# Patient Record
Sex: Female | Born: 1945
Health system: Southern US, Community
[De-identification: ages and names within clinical notes are randomized; demographics above are authoritative.]

## PROBLEM LIST (undated history)

## (undated) DIAGNOSIS — M797 Fibromyalgia: Secondary | ICD-10-CM

## (undated) DIAGNOSIS — N200 Calculus of kidney: Secondary | ICD-10-CM

## (undated) DIAGNOSIS — I10 Essential (primary) hypertension: Secondary | ICD-10-CM

## (undated) DIAGNOSIS — M1990 Unspecified osteoarthritis, unspecified site: Secondary | ICD-10-CM

## (undated) DIAGNOSIS — G20 Parkinson's disease: Secondary | ICD-10-CM

## (undated) DIAGNOSIS — J329 Chronic sinusitis, unspecified: Secondary | ICD-10-CM

## (undated) DIAGNOSIS — T4145XA Adverse effect of unspecified anesthetic, initial encounter: Secondary | ICD-10-CM

## (undated) DIAGNOSIS — F5104 Psychophysiologic insomnia: Secondary | ICD-10-CM

## (undated) DIAGNOSIS — T8859XA Other complications of anesthesia, initial encounter: Secondary | ICD-10-CM

## (undated) DIAGNOSIS — E785 Hyperlipidemia, unspecified: Secondary | ICD-10-CM

## (undated) DIAGNOSIS — E7800 Pure hypercholesterolemia, unspecified: Secondary | ICD-10-CM

## (undated) DIAGNOSIS — E079 Disorder of thyroid, unspecified: Secondary | ICD-10-CM

## (undated) DIAGNOSIS — E039 Hypothyroidism, unspecified: Secondary | ICD-10-CM

## (undated) DIAGNOSIS — I739 Peripheral vascular disease, unspecified: Secondary | ICD-10-CM

## (undated) DIAGNOSIS — R7303 Prediabetes: Secondary | ICD-10-CM

## (undated) HISTORY — DX: Disorder of thyroid, unspecified: E07.9

## (undated) HISTORY — PX: ABDOMINAL HYSTERECTOMY: SHX81

## (undated) HISTORY — DX: Essential (primary) hypertension: I10

## (undated) HISTORY — DX: Hyperlipidemia, unspecified: E78.5

## (undated) HISTORY — DX: Parkinson's disease: G20

## (undated) HISTORY — PX: EXPLORATORY LAPAROTOMY: SUR591

## (undated) HISTORY — DX: Pure hypercholesterolemia, unspecified: E78.00

## (undated) HISTORY — DX: Prediabetes: R73.03

## (undated) HISTORY — PX: APPENDECTOMY: SHX54

## (undated) HISTORY — PX: JOINT REPLACEMENT: SHX530

## (undated) HISTORY — PX: CHOLECYSTECTOMY: SHX55

## (undated) HISTORY — DX: Psychophysiologic insomnia: F51.04

## (undated) HISTORY — PX: CATARACT EXTRACTION: SUR2

## (undated) HISTORY — PX: COLONOSCOPY: SHX174

## (undated) HISTORY — DX: Fibromyalgia: M79.7

---

## 1998-03-15 ENCOUNTER — Encounter: Payer: Self-pay | Admitting: Internal Medicine

## 1998-03-15 ENCOUNTER — Inpatient Hospital Stay (HOSPITAL_COMMUNITY): Admission: EM | Admit: 1998-03-15 | Discharge: 1998-03-16 | Payer: Self-pay | Admitting: Emergency Medicine

## 1998-03-16 ENCOUNTER — Encounter: Payer: Self-pay | Admitting: Emergency Medicine

## 1999-03-16 ENCOUNTER — Other Ambulatory Visit: Admission: RE | Admit: 1999-03-16 | Discharge: 1999-03-16 | Payer: Self-pay | Admitting: *Deleted

## 2000-06-13 ENCOUNTER — Inpatient Hospital Stay (HOSPITAL_COMMUNITY): Admission: EM | Admit: 2000-06-13 | Discharge: 2000-06-16 | Payer: Self-pay | Admitting: Emergency Medicine

## 2000-06-13 ENCOUNTER — Encounter: Payer: Self-pay | Admitting: Emergency Medicine

## 2000-06-13 ENCOUNTER — Encounter: Payer: Self-pay | Admitting: General Surgery

## 2000-06-14 ENCOUNTER — Encounter: Payer: Self-pay | Admitting: General Surgery

## 2000-07-03 ENCOUNTER — Other Ambulatory Visit: Admission: RE | Admit: 2000-07-03 | Discharge: 2000-07-03 | Payer: Self-pay | Admitting: *Deleted

## 2000-07-16 ENCOUNTER — Encounter: Payer: Self-pay | Admitting: Gastroenterology

## 2000-07-16 ENCOUNTER — Encounter: Admission: RE | Admit: 2000-07-16 | Discharge: 2000-07-16 | Payer: Self-pay | Admitting: Gastroenterology

## 2001-07-04 ENCOUNTER — Other Ambulatory Visit: Admission: RE | Admit: 2001-07-04 | Discharge: 2001-07-04 | Payer: Self-pay | Admitting: *Deleted

## 2002-09-18 ENCOUNTER — Other Ambulatory Visit: Admission: RE | Admit: 2002-09-18 | Discharge: 2002-09-18 | Payer: Self-pay | Admitting: *Deleted

## 2004-11-02 ENCOUNTER — Other Ambulatory Visit: Admission: RE | Admit: 2004-11-02 | Discharge: 2004-11-02 | Payer: Self-pay | Admitting: Obstetrics and Gynecology

## 2007-01-17 ENCOUNTER — Ambulatory Visit: Payer: Self-pay

## 2008-03-03 ENCOUNTER — Inpatient Hospital Stay (HOSPITAL_COMMUNITY): Admission: AD | Admit: 2008-03-03 | Discharge: 2008-03-04 | Payer: Self-pay | Admitting: Family Medicine

## 2008-03-03 ENCOUNTER — Encounter (INDEPENDENT_AMBULATORY_CARE_PROVIDER_SITE_OTHER): Payer: Self-pay | Admitting: Internal Medicine

## 2008-03-04 ENCOUNTER — Encounter (INDEPENDENT_AMBULATORY_CARE_PROVIDER_SITE_OTHER): Payer: Self-pay | Admitting: Internal Medicine

## 2008-06-14 ENCOUNTER — Emergency Department (HOSPITAL_BASED_OUTPATIENT_CLINIC_OR_DEPARTMENT_OTHER): Admission: EM | Admit: 2008-06-14 | Discharge: 2008-06-14 | Payer: Self-pay | Admitting: Emergency Medicine

## 2008-06-14 ENCOUNTER — Ambulatory Visit: Payer: Self-pay | Admitting: Diagnostic Radiology

## 2008-12-11 ENCOUNTER — Emergency Department (HOSPITAL_BASED_OUTPATIENT_CLINIC_OR_DEPARTMENT_OTHER): Admission: EM | Admit: 2008-12-11 | Discharge: 2008-12-12 | Payer: Self-pay | Admitting: Internal Medicine

## 2008-12-11 ENCOUNTER — Ambulatory Visit: Payer: Self-pay | Admitting: Diagnostic Radiology

## 2008-12-11 ENCOUNTER — Inpatient Hospital Stay (HOSPITAL_COMMUNITY): Admission: EM | Admit: 2008-12-11 | Discharge: 2008-12-14 | Payer: Self-pay | Admitting: Internal Medicine

## 2008-12-14 ENCOUNTER — Encounter (INDEPENDENT_AMBULATORY_CARE_PROVIDER_SITE_OTHER): Payer: Self-pay | Admitting: Internal Medicine

## 2008-12-14 ENCOUNTER — Ambulatory Visit: Payer: Self-pay | Admitting: Vascular Surgery

## 2009-06-01 ENCOUNTER — Encounter: Admission: RE | Admit: 2009-06-01 | Discharge: 2009-06-01 | Payer: Self-pay | Admitting: Family Medicine

## 2009-09-10 ENCOUNTER — Inpatient Hospital Stay (HOSPITAL_COMMUNITY): Admission: AD | Admit: 2009-09-10 | Discharge: 2009-09-13 | Payer: Self-pay | Admitting: Internal Medicine

## 2009-09-10 ENCOUNTER — Encounter: Payer: Self-pay | Admitting: Internal Medicine

## 2009-09-10 ENCOUNTER — Ambulatory Visit: Payer: Self-pay | Admitting: Diagnostic Radiology

## 2009-09-11 ENCOUNTER — Encounter (INDEPENDENT_AMBULATORY_CARE_PROVIDER_SITE_OTHER): Payer: Self-pay | Admitting: Internal Medicine

## 2009-09-13 ENCOUNTER — Ambulatory Visit: Payer: Self-pay | Admitting: Vascular Surgery

## 2009-09-13 ENCOUNTER — Encounter (INDEPENDENT_AMBULATORY_CARE_PROVIDER_SITE_OTHER): Payer: Self-pay | Admitting: Internal Medicine

## 2010-03-26 ENCOUNTER — Encounter: Payer: Self-pay | Admitting: General Surgery

## 2010-05-22 LAB — BASIC METABOLIC PANEL
BUN: 15 mg/dL (ref 6–23)
BUN: 19 mg/dL (ref 6–23)
CO2: 24 mEq/L (ref 19–32)
CO2: 26 mEq/L (ref 19–32)
Calcium: 9.1 mg/dL (ref 8.4–10.5)
Chloride: 108 mEq/L (ref 96–112)
GFR calc non Af Amer: >60 mL/min (ref >60)
Glucose, Bld: 115 mg/dL — ABNORMAL HIGH (ref 70–99)
Potassium: 4 mEq/L (ref 3.5–5.1)
Potassium: 4.4 mEq/L (ref 3.5–5.1)
Sodium: 140 mEq/L (ref 135–145)

## 2010-05-22 LAB — DIFFERENTIAL
Basophils Absolute: 0.1 10*3/uL (ref 0.0–0.1)
Basophils Relative: 0 % (ref 0–1)
Basophils Relative: 1 % (ref 0–1)
Eosinophils Absolute: 0.2 10*3/uL (ref 0.0–0.7)
Eosinophils Relative: 2 % (ref 0–5)
Lymphocytes Relative: 32 % (ref 12–46)
Lymphocytes Relative: 43 % (ref 12–46)
Lymphs Abs: 2.4 10*3/uL (ref 0.7–4.0)
Monocytes Absolute: 0.6 10*3/uL (ref 0.1–1.0)
Neutro Abs: 3 10*3/uL (ref 1.7–7.7)
Neutro Abs: 4.2 10*3/uL (ref 1.7–7.7)

## 2010-05-22 LAB — CBC
HCT: 35 % — ABNORMAL LOW (ref 36.0–46.0)
HCT: 36 % (ref 36.0–46.0)
Hemoglobin: 12.3 g/dL (ref 12.0–15.0)
Hemoglobin: 12.5 g/dL (ref 12.0–15.0)
MCH: 30.7 pg (ref 26.0–34.0)
MCH: 31.3 pg (ref 26.0–34.0)
MCHC: 34.8 g/dL (ref 30.0–36.0)
MCHC: 35 g/dL (ref 30.0–36.0)
RDW: 11.8 % (ref 11.5–15.5)
RDW: 12 % (ref 11.5–15.5)
RDW: 12.2 % (ref 11.5–15.5)
WBC: 7.7 10*3/uL (ref 4.0–10.5)

## 2010-05-22 LAB — POCT CARDIAC MARKERS: Troponin i, poc: <0.05 ng/mL (ref 0.00–0.09)

## 2010-05-22 LAB — URINALYSIS, ROUTINE W REFLEX MICROSCOPIC
Glucose, UA: NEGATIVE mg/dL
Hgb urine dipstick: NEGATIVE
Specific Gravity, Urine: 1.015 (ref 1.005–1.030)

## 2010-05-22 LAB — T4, FREE: Free T4: 1.64 ng/dL (ref 0.80–1.80)

## 2010-05-22 LAB — PROTIME-INR
INR: 1.07 (ref 0.00–1.49)
INR: 1.22 (ref 0.00–1.49)
Prothrombin Time: 13.1 seconds (ref 11.6–15.2)
Prothrombin Time: 13.8 seconds (ref 11.6–15.2)
Prothrombin Time: 15.3 seconds — ABNORMAL HIGH (ref 11.6–15.2)

## 2010-05-22 LAB — URINE CULTURE

## 2010-05-22 LAB — CK TOTAL AND CKMB (NOT AT ARMC)
CK, MB: 1.9 ng/mL (ref 0.3–4.0)
Relative Index: INVALID (ref 0.0–2.5)
Total CK: 45 U/L (ref 7–177)

## 2010-05-22 LAB — PHOSPHORUS: Phosphorus: 3.3 mg/dL (ref 2.3–4.6)

## 2010-05-22 LAB — URINE MICROSCOPIC-ADD ON

## 2010-05-22 LAB — CARDIAC PANEL(CRET KIN+CKTOT+MB+TROPI)
CK, MB: 1.5 ng/mL (ref 0.3–4.0)
Total CK: 45 U/L (ref 7–177)

## 2010-06-09 LAB — DIFFERENTIAL
Basophils Absolute: 0.1 10*3/uL (ref 0.0–0.1)
Basophils Relative: 1 % (ref 0–1)
Eosinophils Absolute: 0.1 10*3/uL (ref 0.0–0.7)
Eosinophils Relative: 1 % (ref 0–5)
Monocytes Absolute: 0.8 10*3/uL (ref 0.1–1.0)
Monocytes Relative: 9 % (ref 3–12)

## 2010-06-09 LAB — CARDIOLIPIN ANTIBODIES, IGG, IGM, IGA
Anticardiolipin IgA: 6 APL U/mL — ABNORMAL LOW (ref <10)
Anticardiolipin IgG: 5 GPL U/mL — ABNORMAL LOW (ref <10)
Anticardiolipin IgM: <3 MPL U/mL — ABNORMAL LOW (ref <10)

## 2010-06-09 LAB — PROTEIN C ACTIVITY: Protein C Activity: 147 % — ABNORMAL HIGH (ref 75–133)

## 2010-06-09 LAB — CARDIAC PANEL(CRET KIN+CKTOT+MB+TROPI)
CK, MB: 0.8 ng/mL (ref 0.3–4.0)
CK, MB: 1.3 ng/mL (ref 0.3–4.0)
Relative Index: INVALID (ref 0.0–2.5)
Troponin I: <0.01 ng/mL (ref 0.00–0.06)
Troponin I: <0.01 ng/mL (ref 0.00–0.06)

## 2010-06-09 LAB — LUPUS ANTICOAGULANT PANEL
DRVVT: 40.3 secs (ref 34.7–40.5)
Lupus Anticoagulant: NOT DETECTED

## 2010-06-09 LAB — CBC
HCT: 36.3 % (ref 36.0–46.0)
HCT: 40.5 % (ref 36.0–46.0)
Hemoglobin: 13.4 g/dL (ref 12.0–15.0)
Hemoglobin: 12.6 g/dL (ref 12.0–15.0)
Hemoglobin: 12.7 g/dL (ref 12.0–15.0)
Hemoglobin: 12.9 g/dL (ref 12.0–15.0)
MCHC: 34.5 g/dL (ref 30.0–36.0)
MCHC: 34.7 g/dL (ref 30.0–36.0)
MCV: 88.3 fL (ref 78.0–100.0)
MCV: 89.4 fL (ref 78.0–100.0)
MCV: 89.7 fL (ref 78.0–100.0)
Platelets: 300 10*3/uL (ref 150–400)
RBC: 4.38 MIL/uL (ref 3.87–5.11)
RBC: 4.47 MIL/uL (ref 3.87–5.11)
RDW: 12.1 % (ref 11.5–15.5)
RDW: 11.9 % (ref 11.5–15.5)
RDW: 12.1 % (ref 11.5–15.5)
WBC: 5.9 10*3/uL (ref 4.0–10.5)

## 2010-06-09 LAB — TSH: TSH: 2.769 u[IU]/mL (ref 0.350–4.500)

## 2010-06-09 LAB — BASIC METABOLIC PANEL
CO2: 25 mEq/L (ref 19–32)
CO2: 25 mEq/L (ref 19–32)
Calcium: 9.6 mg/dL (ref 8.4–10.5)
Calcium: 9 mg/dL (ref 8.4–10.5)
Chloride: 103 mEq/L (ref 96–112)
Chloride: 106 mEq/L (ref 96–112)
Creatinine, Ser: 0.82 mg/dL (ref 0.4–1.2)
GFR calc Af Amer: >60 mL/min (ref >60)
Glucose, Bld: 196 mg/dL — ABNORMAL HIGH (ref 70–99)
Glucose, Bld: 95 mg/dL (ref 70–99)
Sodium: 139 mEq/L (ref 135–145)
Sodium: 139 mEq/L (ref 135–145)

## 2010-06-09 LAB — BETA-2-GLYCOPROTEIN I ABS, IGG/M/A
Beta-2 Glyco I IgG: 13 U/mL (ref <=15)
Beta-2-Glycoprotein I IgM: 4 U/mL (ref <=15)

## 2010-06-09 LAB — PROTIME-INR
INR: 0.99 (ref 0.00–1.49)
INR: 1.2 (ref 0.00–1.49)

## 2010-06-09 LAB — ANTITHROMBIN III: AntiThromb III Func: 75 % — ABNORMAL LOW (ref 76–126)

## 2010-06-09 LAB — HOMOCYSTEINE: Homocysteine: 14.5 umol/L (ref 4.0–15.4)

## 2010-06-09 LAB — POCT CARDIAC MARKERS: CKMB, poc: 1.3 ng/mL (ref 1.0–8.0)

## 2010-06-09 LAB — HEMOGLOBIN A1C
Hgb A1c MFr Bld: 5.9 % (ref 4.6–6.1)
Mean Plasma Glucose: 123 mg/dL

## 2010-07-19 NOTE — Discharge Summary (Signed)
[name] [name], [name] [name]               ACCOUNT NO.:  [id number]   MEDICAL RECORD NO.:  [id number]          PATIENT TYPE:  INP   LOCATION:  3733                         FACILITY:  [facility]   PHYSICIAN:  Michiel Cowboy, MDDATE OF BIRTH:  15-Jun-1945   DATE OF ADMISSION:  03/03/2008  DATE OF DISCHARGE:  03/04/2008                               DISCHARGE SUMMARY   PRIORITY DISCHARGE SUMMARY   DISCHARGE DIAGNOSES:  1. Diplopia of unclear etiology.  May be medication related such as      Lyrica related versus undergoing workup for myasthenia gravis.  2. A bruit in the right chest with a newly discovered subclavian      stenosis.  3. Adrenal adenoma of unclear significance.  4. Mild inflammatory changes on a computerized tomography (CT) scan of      her chest of unclear significance.  5. Fatty liver infiltration.  6. Hypertriglyceridemia.  7. History of fibromyalgia.  8. Arthritis.  9. Hypothyroidism.  10.Depression and anxiety.  11.Hypertension.  12.Insomnia.   PRIMARY CARE Rubby Barbary:  C. Duane Lope, MD, of [facility].   CONSULTATIONS:  Dr. Brynda Greathouse of Neurology.   STUDIES:  1. Plain chest x-ray on March 03, 2008, showing minimal changes of      COPD and chronic bronchitis with mild left basilar scarring.  2. MRI of the brain, MRA of the brain, and MRA of the neck:      a.     MRI of the head showed no acute infarct, atrophy, mild small       vessel disease-type changes, mild partial opacification of mastoid       air cells with minimal mucosal thickening of ethmoid sinus air       cells.      b.     MRA of the head showed mild intracranial atherosclerotic-       type changes, otherwise unremarkable, with a small right vertebral       artery.      c.     MRA of her neck showing no evidence of hemodynamically       significant stenosis with moderate stenosis of proximal right       subclavian artery.  3. CT angiogram of her chest showed no PE, a very small area of right      middle  lobe inflammation/infection, a small hiatal hernia, diffuse      fatty infiltration of her liver, right adrenal adenoma.  4. Carotid Dopplers preliminary result unremarkable, no evidence of      significant stenosis.  5. Two-D echo showing EF of 55 to 60%, no diagnostic evidence of left      ventricular regional wall motion abnormality, no diagnostic      evidence of cardiac source of embolism noted.  This was a difficult      study with poor evaluation on the right.   HOSPITAL COURSE:  1. The patient is a 65 year old female, who has been admitted for      diplopia, which comes and goes, usually appears during the night      when she  gets extra tired, also a feeling of overall confusion, not      feeling well, and vertigo and possible nystagmus.  The patient was      admitted and evaluated for possible CVA given the above-mentioned      symptoms to her primary care [name].  They also noticed that she      has a bruit on the right, which they did not appreciate before.      The patient had undergone a CVA workup, which did not show any      evidence of CVA, and Neurology was consulted anyway given diplopia      and thought that there was the possibility of myasthenia gravis.      Also per review of her records, the patient has been recently      started on Lyrica, and per the literature Lyrica can cause      diplopia, which also will be discontinued.  The patient will be      discharged to home.  Prior to discharge, a serology for myasthenia      gravis was obtained.  Patient will have to follow up with Dr.      Brynda Greathouse in regards to that with Neurology.  Patient will schedule      her followup appointment.  Of note, during her hospitalization      during this workup it was noted that the patient was having      elevated triglycerides level.  She will be started on Tricor to      minimize her risk of stroke.  A hemoglobin A1c was within normal      limits.  Her cardiac enzymes were  within normal limits.  Her total      cholesterol was 198 with triglycerides slightly elevated at 207 and      ADL slightly elevated at 108.  This probably could be controlled      with a diet as well, but given the patient has some symptoms Tricor      was started.  This may be discontinued as an outpatient if so      desired by her primary care [name].  Her homocystine level was      within normal limits.  Anemia panel was performed, B12 was within      normal limits.  The patient did have mild iron deficiency, but no      evidence of anemia.  2. Fibromyalgia.  Given a history of fibromyalgia, we will stop Lyrica      but will try to use amitriptyline for pain control as well as help      her depression symptoms.  3. Bruit.  After a careful workup, there was note of a right      subclavian stenosis, which was probably what was explaining the      bruit.  Neurology also thought that that was an explanation.  The      patient needs to have followup of that as an outpatient and will      basically need to have monitoring to make sure she does not develop      any symptoms.  At this point while she is asymptomatic, likely no      further workup will be necessary.  If patient does become      symptomatic such as having any evidence of steal syndrome or      claudication then Vascular Surgery could be consulted.  4.  Mild infection on the CT scan.  The patient had some mild cough and      head stuffiness.  We will give her a single dose of Augmentin.  She      also had a mild evidence of sinusitis per her MRI, which this      should help with.  Of note, chest x-ray is within normal limits.  5. The patient was noted to have a mild fatty liver infiltration.  She      did not have any LFT abnormalities at this point.  Recommended      weight loss and cholesterol control.   DISCHARGE MEDICATIONS:  1. Synthroid 137 mcg daily.  2. Effexor 75 mg daily.  3. Benicar/hydrochlorothiazide, resume  home dose.  4. Aspirin 81 mg daily.  5. Zyrtec 10 mg p.o. daily.  6. Ambien as needed at bedtime.   NEW MEDICATIONS:  1. Tricor 145 mg p.o. daily.  2. Elavil 25 mg p.o. q.h.s.  3. Augmentin 875 mg p.o. b.i.d. x7 days p.r.n.      Michiel Cowboy, MD  Electronically Signed     AVD/MEDQ  D:  03/04/2008  T:  03/04/2008  Job:  [id number]   cc:   C. Duane Lope, M.D.  Dr. Brynda Greathouse

## 2010-07-19 NOTE — H&P (Signed)
NAMEKAWANDA, DRUMHELLER               ACCOUNT NO.:  [id number]   MEDICAL RECORD NO.:  [id number]          PATIENT TYPE:  INP   LOCATION:  3733                         FACILITY:  [facility]   PHYSICIAN:  Michiel Cowboy, MDDATE OF BIRTH:  [birth year]   DATE OF ADMISSION:  03/03/2008  DATE OF DISCHARGE:                              HISTORY & PHYSICAL   PRIMARY CARE Janaria Mccammon:  Dr. Vianne Bulls with [facility].   CHIEF COMPLAINT:  Vertigo, double vision, and state of confusion for  past week or so on and off.  Patient is a 65 year old female with  history of hypothyroidism and severe arthritis taking Celebrex and was  on baby aspirin.  Otherwise, unremarkable past medical history who,  prior to this, was at her baseline of health but for the past 1 week  noticed that when she drives, she has hard time seeing with double  vision and also noticed some vertigo when she tries to stand up.  Her  family noticed some nystagmus as well.  She went to her primary care  Gessica Jawad who noticed a bruit on the right side of her neck and  recommended further workup.  The patient, at this point, was admitted to  [facility] to [facility] service.  Otherwise, she reports slight weight  gain.  No chest pain, no shortness of breath, no fevers , no chills.  Otherwise, review of systems unremarkable.   PAST MEDICAL HISTORY:  Significant for severe arthritis, hypothyroidism,  mild depression, hypertension, and difficulty sleeping as well as  seasonal allergies.   SOCIAL HISTORY:  Patient used to smoke but quit in 1991.  Does not use  drugs.  Drinks occasional alcohol socially.  Lives alone but does have  grown children who are involved.   ALLERGIES:  ERYTHROMYCIN.   MEDICATIONS:  1. Celebrex 200 mg daily.  2. Synthroid 137 mcg daily.  3. Lyrica 75 mg daily.  4. Effexor 75 mg daily.  5. Benicar/hydrochlorothiazide 20/12.5 mg daily.  6. Aspirin 81 mg daily.  7. Zyrtec 10 mg daily.  8. Ambien 10 mg p.o. nightly.  9. The patient also takes glucosamine.   PHYSICAL EXAMINATION:  VITALS:  Temperature 97.6, blood pressure 113/68,  pulse 73, respirations 18, satting 99% on room air.  No labs obtained at this point.  HEAD:  Nontraumatic.  NECK:  Somewhat obese.  There is a definite right-sided bruit noted.  No  JVD noted.  LUNGS:  Clear to auscultation bilaterally.  HEART:  There is somewhat of a click and perhaps some ejection murmur in  the second right intercostal space.  EXTREMITIES:  Without edema, clubbing or cyanosis.  ABDOMEN:  Slightly obese but soft, nontender, not distended.  Strength 5/5 in all 4 extremities.  Cranial nerves intact but there is  definite vertigo, nystagmus noted.  Negative Romberg sign.  No upper  extremity drift noted.  Finger-to-nose intact.   ASSESSMENT AND PLAN:  This is a 65 year old female with possible new  bruits in neck and also possibly vertigo, nystagmus worrisome for  cerebrovascular accident apparently explaining her vertigo and also  worried  for possible carotid artery stenosis.  Given that there is a  possible murmur as well, will need further investigation.  1. Vertigo, nystagmus.  We will obtain an MRI/MRA of her brain, neck,      obtain carotid Dopplers, 2-D echo.  Per Dr. Tenny Craw, he had discussed      this with radiology.  Hopefully, the patient may benefit from CT      angiogram to rule out stenosis of large arteries in her chest.  We      will obtain BNP and if creatinine is within normal limits, we will      likely consider doing that as well, especially if the rest of      workup is unremarkable.  We will also check B-12, folate level,      although it is not as likely.  Patient does not have an alcohol      abuse history.  2. For now we will have a neuro workup.  If MRI is worrisome for      cerebrovascular accident, we will call official neurology consult.  3. Hypertension.  For now we will hold Benicar since patient is      somewhat hypotensive  and check orthostatics.  4. Depression.  Continue Effexor.  5. Hypothyroidism.  Check TSH and continue Synthroid p.r.n.  6. Prophylaxis.  Protonix.  Sequential compression devices.      Michiel Cowboy, MD  Electronically Signed     AVD/MEDQ  D:  03/03/2008  T:  03/03/2008  Job:  [id number]   cc:   C. Duane Lope, M.D.

## 2010-07-19 NOTE — Consult Note (Signed)
NAMEMALEYA, LEEVER               ACCOUNT NO.:  [id number]   MEDICAL RECORD NO.:  [id number]          PATIENT TYPE:  INP   LOCATION:  3733                         FACILITY:  [facility]   PHYSICIAN:  Casimiro Needle L. Reynolds, M.D.DATE OF BIRTH:  1945-11-29   DATE OF CONSULTATION:  03/04/2008  DATE OF DISCHARGE:                                 CONSULTATION   CHIEF COMPLAINT:  Double vision.   HISTORY OF PRESENT ILLNESS:  This is a 65 year old female with 2-week  history of double vision that occurs intermittently.  First symptoms  occurred approximately 2 weeks ago while she was driving her car, she  states that she was driving home late in night from her daughter's house  when she noticed [place] in front of her.  The patient  states that when she put her hand over her right eye, her blurred vision  and double vision resolved.  This incident lasted for approximately 10  minutes at that time.  Since that date approximately once a day, she has  noticed double vision occurring and if she covers her right eye, it  clears.  Most of her double vision occurs later in the day or at night.  In addition, she complains of posterior neck pain that is a new symptom  as well.  The patient denies any specific direction in which she looks  that would provoke any double vision.  The patient had brought this up  to her daughter and son-in-law one time when she noticed during the six  cardinal gazes.  The patient had a right eye medial deviation that  lasted it for seconds and resolved.  The patient went to her  ophthalmologist at that time thinking she needed new eye glasses, she  got a new prescription and is to follow up with her ophthalmologist in 2  weeks.  She has not noticed any difference in her vision since she has  received her new glasses.  At the time of interview, the patient was  back at baseline, she had no nystagmus, double vision, was not  complaining of a headache or any other  abnormalities.  The patient does  have a history of Graves disease for which she has received iodine  radiation.   PAST MEDICAL HISTORY:  Hypertension, Graves disease, hypothyroidism,  arthritis, and depression.   MEDICATIONS:  1. Celebrex 200 mg daily.  2. Synthroid 137 mcg daily.  3. Lyrica 75 mg daily.  4. Effexor 75 mg daily.  5. Benicar and hydrochlorothiazide 20/21.5 daily.  6. Aspirin 81 mg daily.  7. Zyrtec 10 mg daily.  8. Ambien 10 mg p.o. at bedtime.   ALLERGIES:  ERYTHROMYCIN.   SOCIAL HISTORY:  She does not smoke, drink, or do illicit drugs.  She is  a fairly active female who takes care of all of her activities of daily  living.   REVIEW OF SYSTEMS:  All 12 review of systems were checked and all  negative with the exception of above.   PHYSICAL EXAMINATION:  VITAL SIGNS:  Blood pressure is 114/60, pulse 67,  respiratory rate 18,  and temperature 97.8.  MENTAL STATUS:  She is alert and oriented x3 with good renunciation.  Acts appropriately.  Answers questions appropriately.  Carries out 2 and  3 step commands without any difficulty.  CRANIAL NERVES:  Pupils are equal, round, reactive, accommodating to  light.  Conjugate gaze during the cover-uncover test for visual field,  it resulted in a intermittent phoria.  No ptosis was noted.  Visual  fields were intact.  No nystagmus noted.  Face was symmetrical.  Tongue  was midline.  Uvula was midline.  The patient had no dysarthria, aphasia  or slurred speech.  Sensation from V1-V3 was full and intact.  Shoulder  shrug was full and intact.  The patient did have slight weakness with  forward flexion of her neck.  No weakness with jaw flexion and  extension.  Coordination, finger-to-nose was within normal limits.  Heel-  to-shin normal limits.  Fine motor movement in normal limit.  Gait, the  patient had wide base, short-steps with negative Romberg and symmetrical  balance.  Motor was 5/5 strength throughout with good  bulk and tone.  No  tremor, clonus, or asterixis.  Deep tendon reflexes were 1+ throughout  with the exception of Achilles, which were 0, bilateral downgoing toes.  PULMONARY:  Clear to auscultation bilaterally.  CARDIOVASCULAR:  S1 and S2.  Regular rate and rhythm.  NECK:  Positive bruit over the right, most likely secondary to a right  subclavian stenosis, which is proximal to her vertebral artery.  Her  neck was supple.  Sensation was full throughout.   LABORATORY DATA:  HbA1C was 5.8, sodium was 140, potassium 3.6, chloride  102, CO2 of 29, BUN 16, creatinine 0.73, and glucose 118.  White blood  cell count was 7.6, hemoglobin and hematocrit 12.8 and 38.3, and  platelets 315, triglycerides 207, cholesterol 198, HDL 49, and LDL 108.  Imaging test, MRI of the head showed no noticeable acute infarct.  MRA  of the head showed mild intracranial atherosclerosis.  MRA of the neck  with contrast showed moderate narrowing of her right subclavian,  otherwise normal, showed minimal narrowing of the left ICA.  No stenosis  in the right ICA.  A 2-D echo showed left ventricular systolic function  was normal with 55-60% ejection fraction.  No embolism noted.   TREATMENT AND PLAN:  At this time, a 66 year old white female with  differential diagnoses of:  1. Diplopia, myasthenia gravis versus ophthalmopathy.  At this time, I      am going to request for acetylcholine antibodies for binding,      locking, and nodulating.  If those are negative, possibly get an      MRI of her orbits of her eye and/or single fiber muscle EMG at [facility]      or [facility].  2. Right carotid bruit.  At this time, most likely secondary to right      subclavian stenosis.  Treatment will be aspirin, risk factor      modification and monitoring.  We will continue to follow up the      patient in-house, and have the patient followup with Dr. Thad Ranger      as an outpatient for myasthenia gravis.      ______________________________  Felicie Morn, PA-C      Marolyn Hammock. Thad Ranger, M.D.  Electronically Signed    DS/MEDQ  D:  03/04/2008  T:  03/05/2008  Job:  [id number]   cc:   Dr. Thad Ranger

## 2010-07-22 NOTE — Discharge Summary (Signed)
[place]  Patient:    [name] [name], [name] [name]                        MRN: [id number] Adm. Date:  [id number] Disc. Date: [id number] Attending:  Cherylynn Ridges                           Discharge Summary  DISCHARGE DIAGNOSES: 1. Partial small bowel obstruction. 2. Depression.  DISCHARGE MEDICATIONS:  She is to take her preoperative medications.  I do not believe that any pain medicines were given to her on discharge.  FOLLOW-UP:  She is to follow up to see me in a couple of weeks.  BRIEF SUMMARY OF HOSPITAL COURSE:  The patient was admitted with a partial small bowel obstruction.  She had had a previous hysterectomy and ulcer surgeries.  Her husband had died approximately one year ago and she has had increasing abdominal complaints since that time.  She was admitted and watched over hospital days #1 and #2.  Her white blood cell count improved.  She never had an increased white blood cell count.  Her abdomen was soft and nontender. The small bowel obstruction had resolved by day #3 and she was discharged to home. DD:  07/12/00 TD:  07/14/00 Job: [id number]

## 2010-07-22 NOTE — H&P (Signed)
[place]  Patient:    [name] [name], [name] [name]                        MRN: [id number] Adm. Date:  [id number] Attending:  Cherylynn Ridges                         History and Physical  CHIEF COMPLAINT: The patient is a 65 year old female with a partial small bowel obstruction.  HISTORY OF PRESENT ILLNESS: The patient was in her usual state of health until this morning when she developed abdominal pain associated with nausea, no vomiting.  She came in and was evaluated about midnight, and stayed in the emergency room approximately 12-13 hours prior to surgical consultation.  At that time she had had a CT scan which shows some mildly dilated small bowel loops and plain films demonstrated what appeared to be an ileus; however, the patient was having worsening abdominal pain.  Her initial WBC was 14.4 and had dropped to 9.1, but because of the continuing pain and nausea she was admitted for IV hydration, pain control, and possibly NG tube decompression for small bowel obstruction.  PAST MEDICAL HISTORY: Unremarkable, although she has recently had strep throat and she was taking amoxicillin.  PAST SURGICAL HISTORY:  1. She has had a total abdominal hysterectomy with the exception of one     ovary.  She was on hormonal replacement.  2. She has also had uterine suspension surgery.  3. Right upper quadrant incision for a gallbladder procedure and total     hysterectomy.  PHYSICAL EXAMINATION:  ABDOMEN: On examination she has good bowel sounds, is tender in the left lower quadrant with rebound, but there is no obvious peritonitis.  RECTAL: Unremarkable.  LABORATORY DATA: Laboratory studies are okay.  IMPRESSION: Likely partial small bowel obstruction.  The contrast from the CT scan had gotten in through to the colon; however, she still has mildly elevated or dilated small bowel loops.  PLAN: She is going to be admitted for IV hydration and possible NG  tube decompression, and pain control and nausea control. DD:  06/13/00 TD:  06/14/00 Job: [id number]

## 2010-07-26 ENCOUNTER — Other Ambulatory Visit: Payer: Self-pay | Admitting: Hematology and Oncology

## 2010-07-26 ENCOUNTER — Encounter (HOSPITAL_BASED_OUTPATIENT_CLINIC_OR_DEPARTMENT_OTHER): Payer: Commercial Managed Care - PPO | Admitting: Hematology and Oncology

## 2010-07-26 DIAGNOSIS — Z7901 Long term (current) use of anticoagulants: Secondary | ICD-10-CM

## 2010-07-26 DIAGNOSIS — I2782 Chronic pulmonary embolism: Secondary | ICD-10-CM

## 2010-07-26 DIAGNOSIS — M1710 Unilateral primary osteoarthritis, unspecified knee: Secondary | ICD-10-CM

## 2010-07-26 LAB — COMPREHENSIVE METABOLIC PANEL
ALT: 21 U/L (ref 0–35)
BUN: 17 mg/dL (ref 6–23)
CO2: 25 mEq/L (ref 19–32)
Calcium: 9.7 mg/dL (ref 8.4–10.5)
Chloride: 105 mEq/L (ref 96–112)
Creatinine, Ser: 0.76 mg/dL (ref 0.40–1.20)
Glucose, Bld: 70 mg/dL (ref 70–99)
Total Bilirubin: 0.3 mg/dL (ref 0.3–1.2)

## 2010-07-26 LAB — CBC WITH DIFFERENTIAL/PLATELET
BASO%: 0.7 % (ref 0.0–2.0)
Basophils Absolute: 0.1 10*3/uL (ref 0.0–0.1)
EOS%: 2.5 % (ref 0.0–7.0)
HCT: 38.2 % (ref 34.8–46.6)
HGB: 12.7 g/dL (ref 11.6–15.9)
LYMPH%: 34.4 % (ref 14.0–49.7)
MCH: 29.6 pg (ref 25.1–34.0)
MCHC: 33.1 g/dL (ref 31.5–36.0)
MCV: 89.3 fL (ref 79.5–101.0)
MONO%: 10.2 % (ref 0.0–14.0)
NEUT%: 52.2 % (ref 38.4–76.8)
lymph#: 2.4 10*3/uL (ref 0.9–3.3)

## 2010-09-02 ENCOUNTER — Other Ambulatory Visit: Payer: Self-pay | Admitting: Orthopedic Surgery

## 2010-09-02 ENCOUNTER — Other Ambulatory Visit (HOSPITAL_COMMUNITY): Payer: Self-pay | Admitting: Orthopedic Surgery

## 2010-09-02 ENCOUNTER — Encounter (HOSPITAL_COMMUNITY): Payer: Commercial Managed Care - PPO

## 2010-09-02 ENCOUNTER — Ambulatory Visit (HOSPITAL_COMMUNITY)
Admission: RE | Admit: 2010-09-02 | Discharge: 2010-09-02 | Disposition: A | Discharge status: Home / Self Care | Payer: Commercial Managed Care - PPO | Source: Ambulatory Visit | Attending: Orthopedic Surgery | Admitting: Orthopedic Surgery

## 2010-09-02 DIAGNOSIS — M1710 Unilateral primary osteoarthritis, unspecified knee: Secondary | ICD-10-CM | POA: Insufficient documentation

## 2010-09-02 DIAGNOSIS — Z01812 Encounter for preprocedural laboratory examination: Secondary | ICD-10-CM | POA: Insufficient documentation

## 2010-09-02 DIAGNOSIS — E785 Hyperlipidemia, unspecified: Secondary | ICD-10-CM | POA: Insufficient documentation

## 2010-09-02 DIAGNOSIS — Z79899 Other long term (current) drug therapy: Secondary | ICD-10-CM | POA: Insufficient documentation

## 2010-09-02 DIAGNOSIS — Z01818 Encounter for other preprocedural examination: Secondary | ICD-10-CM

## 2010-09-02 DIAGNOSIS — I10 Essential (primary) hypertension: Secondary | ICD-10-CM | POA: Insufficient documentation

## 2010-09-02 DIAGNOSIS — E039 Hypothyroidism, unspecified: Secondary | ICD-10-CM | POA: Insufficient documentation

## 2010-09-02 DIAGNOSIS — M4120 Other idiopathic scoliosis, site unspecified: Secondary | ICD-10-CM | POA: Insufficient documentation

## 2010-09-02 DIAGNOSIS — Z01810 Encounter for preprocedural cardiovascular examination: Secondary | ICD-10-CM | POA: Insufficient documentation

## 2010-09-02 DIAGNOSIS — Z7901 Long term (current) use of anticoagulants: Secondary | ICD-10-CM | POA: Insufficient documentation

## 2010-09-02 DIAGNOSIS — Z86718 Personal history of other venous thrombosis and embolism: Secondary | ICD-10-CM | POA: Insufficient documentation

## 2010-09-02 LAB — URINALYSIS, ROUTINE W REFLEX MICROSCOPIC
Bilirubin Urine: NEGATIVE
Glucose, UA: NEGATIVE mg/dL
Leukocytes, UA: NEGATIVE
Nitrite: NEGATIVE
Specific Gravity, Urine: 1.02 (ref 1.005–1.030)
pH: 7.5 (ref 5.0–8.0)

## 2010-09-02 LAB — APTT: aPTT: 49 seconds — ABNORMAL HIGH (ref 24–37)

## 2010-09-02 LAB — CBC
HCT: 40.7 % (ref 36.0–46.0)
MCV: 88.1 fL (ref 78.0–100.0)
RBC: 4.62 MIL/uL (ref 3.87–5.11)
RDW: 12.3 % (ref 11.5–15.5)
WBC: 6.8 10*3/uL (ref 4.0–10.5)

## 2010-09-02 LAB — DIFFERENTIAL
Basophils Absolute: 0 10*3/uL (ref 0.0–0.1)
Eosinophils Relative: 3 % (ref 0–5)
Lymphocytes Relative: 35 % (ref 12–46)
Lymphs Abs: 2.4 10*3/uL (ref 0.7–4.0)
Monocytes Absolute: 0.7 10*3/uL (ref 0.1–1.0)
Neutro Abs: 3.6 10*3/uL (ref 1.7–7.7)

## 2010-09-02 LAB — BASIC METABOLIC PANEL
Calcium: 10.5 mg/dL (ref 8.4–10.5)
Creatinine, Ser: 0.76 mg/dL (ref 0.50–1.10)
GFR calc Af Amer: >60 mL/min (ref >60)
Sodium: 141 mEq/L (ref 135–145)

## 2010-09-02 LAB — PROTIME-INR: INR: 2.37 — ABNORMAL HIGH (ref 0.00–1.49)

## 2010-09-02 LAB — SURGICAL PCR SCREEN
MRSA, PCR: NEGATIVE
Staphylococcus aureus: POSITIVE — AB

## 2010-09-05 NOTE — H&P (Signed)
NAMECRESTA, RIDEN NO.:  [id number]  MEDICAL RECORD NO.:  [id number]  LOCATION:                                 FACILITY:  PHYSICIAN:  Madlyn Frankel. Charlann Boxer, M.D.  DATE OF BIRTH:  June 10, 1945  DATE OF ADMISSION: DATE OF DISCHARGE:                             HISTORY & PHYSICAL   Date of surgery is September 12, 2010.  ADMISSION DIAGNOSES: 1. Left knee osteoarthritis. 2. History of deep vein thrombosis with pulmonary embolism.  HISTORY OF PRESENT ILLNESS:  This is a 65 year old lady with a history of the left knee osteoarthritis with failure of conservative treatment to alleviate her pain.  Due to continued pain and discomfort, she is now scheduled for total knee arthroplasty of the left knee.  She has had a previous arthroscopy with DVT and PE.  She has been worked up at the [facility].  We will have her stop her Coumadin 5 days before surgery, start Lovenox the following day 30 mg b.i.d., taking the last dose the evening before surgery and then checking PT/PTT on the day of surgery.  She will be bridged with Lovenox postoperatively until her Coumadin is therapeutic.  Her medical doctor is Dr. Selena Batten.  The surgery, risks, benefits, and aftercare were discussed with the patient. Questions invited and answered.  She does understand the increased risk of DVT, PE, and possible death secondary to her history of DVT and PE.  PAST MEDICAL HISTORY:  Drug allergies to erythromycin with a rash.  CURRENT MEDICATIONS: 1. Coumadin 5 mg 1/2 tablet twice a week, 1 tablet the other days. 2. Gabapentin 100 mg b.i.d. 3. Effexor 75 mg one daily. 4. Benicar hydrochlorothiazide 40/12.5 one daily. 5. Ambien 10 mg nightly. 6. Lasix 20 mg one daily p.r.n. leg swelling. 7. Celebrex 200 mg q.12 h. 8. Lidoderm patch, one patch q.12 h. on, 12 hours off. 9. Synthroid 137 mcg one daily. 10.Fenofibrate 200 mg one daily. 11.Fish oil 1200 mg one daily. 12.Vitamin D3 2000 units  orally daily. 13.Magnesium 250 mg daily. 14.Aspirin 81 mg daily. 15.Multivitamin once a day. 16.Tylenol 325 one p.o. q.6 h. p.r.n. 17.Super B-complex daily. 18.Glucosamine 1500 mg daily. 19.Calcium with vitamin D daily.  SERIOUS MEDICAL ILLNESSES: 1. History of DVT with PE. 2. Depression. 3. Hypertension. 4. Hypothyroidism. 5. Hyperlipidemia.  PREVIOUS SURGERIES:  Exploratory laparotomy, uterine suspension, cholecystectomy, and hysterectomy.  Also history of shingles.  Also history of glaucoma and cataracts.  FAMILY HISTORY:  Positive for stroke and heart disease.  SOCIAL HISTORY:  The patient is widowed.  She lives alone.  She does not smoke and drinks rarely.  REVIEW OF SYSTEMS:  CENTRAL NERVOUS SYSTEM:  Positive for occasional night sweats.  PULMONARY:  Positive for exertional shortness of breath, negative for chest pain, or palpitation.  CARDIOVASCULAR:  Negative for chest pain or palpitation.  GI:  Negative for ulcers or hepatitis.  GU: Negative for urinary tract difficulties other than occasional incontinence.  MUSCULOSKELETAL:  Positives in HPI.  PHYSICAL EXAM:  VITAL SIGNS:  BP 120/78, respirations 14, pulse 72 and regular. GENERAL APPEARANCE:  This is a well-developed and well-nourished lady in no acute distress. HEENT:  Head  normocephalic.  Nose patent.  Ears patent.  Pupils are equal, round, and reactive to light.  Throat without injection. NECK:  Supple without adenopathy.  Carotids with a very slight bruit in the right carotid, which the patient states has been there for a long time. CHEST:  Clear to auscultation.  No rales or rhonchi.  Respirations 14. HEART:  Regular rate and rhythm at 72 beats per minute without murmur. ABDOMEN:  Soft.  Active bowel sounds.  No masses or organomegaly. NEUROLOGIC:  The patient is alert and oriented to time, place, and person.  Cranial nerves II-XII grossly intact.  Extremity shows left knee with 3-degree flexion traction,  further flexion to 120 degrees. Neurovascular status intact.  Calf is negative for tenderness and cords and negative Homans sign.  ASSESSMENT:  Left knee osteoarthritis.  PLAN:  Total knee arthroplasty, left knee.     Jaquelyn Bitter. Chabon, P.A.   ______________________________ Madlyn Frankel Charlann Boxer, M.D.   SJC/MEDQ  D:  08/24/2010  T:  08/25/2010  Job:  [id number]  Electronically Signed by Jodene Nam P.A. on 08/29/2010 12:50:43 PM Electronically Signed by Durene Romans M.D. on 09/05/2010 09:28:55 AM

## 2010-09-12 ENCOUNTER — Inpatient Hospital Stay (HOSPITAL_COMMUNITY)
Admission: RE | Admit: 2010-09-12 | Discharge: 2010-09-15 | DRG: 470 | Disposition: A | Discharge status: Home Health | Payer: Commercial Managed Care - PPO | Source: Ambulatory Visit | Attending: Orthopedic Surgery | Admitting: Orthopedic Surgery

## 2010-09-12 DIAGNOSIS — F3289 Other specified depressive episodes: Secondary | ICD-10-CM | POA: Diagnosis present

## 2010-09-12 DIAGNOSIS — M1710 Unilateral primary osteoarthritis, unspecified knee: Principal | ICD-10-CM | POA: Diagnosis present

## 2010-09-12 DIAGNOSIS — Z86718 Personal history of other venous thrombosis and embolism: Secondary | ICD-10-CM

## 2010-09-12 DIAGNOSIS — E785 Hyperlipidemia, unspecified: Secondary | ICD-10-CM | POA: Diagnosis present

## 2010-09-12 DIAGNOSIS — E039 Hypothyroidism, unspecified: Secondary | ICD-10-CM | POA: Diagnosis present

## 2010-09-12 DIAGNOSIS — Z7901 Long term (current) use of anticoagulants: Secondary | ICD-10-CM

## 2010-09-12 DIAGNOSIS — F329 Major depressive disorder, single episode, unspecified: Secondary | ICD-10-CM | POA: Diagnosis present

## 2010-09-12 DIAGNOSIS — I10 Essential (primary) hypertension: Secondary | ICD-10-CM | POA: Diagnosis present

## 2010-09-12 LAB — DIFFERENTIAL
Basophils Absolute: 0.1 10*3/uL (ref 0.0–0.1)
Basophils Relative: 1 % (ref 0–1)
Eosinophils Absolute: 0.2 10*3/uL (ref 0.0–0.7)
Eosinophils Relative: 3 % (ref 0–5)
Monocytes Absolute: 0.7 10*3/uL (ref 0.1–1.0)

## 2010-09-12 LAB — TYPE AND SCREEN
ABO/RH(D): A POS
Antibody Screen: NEGATIVE

## 2010-09-12 LAB — CBC
MCHC: 32.9 g/dL (ref 30.0–36.0)
Platelets: 322 10*3/uL (ref 150–400)
RDW: 12.1 % (ref 11.5–15.5)

## 2010-09-12 LAB — APTT: aPTT: 28 seconds (ref 24–37)

## 2010-09-12 LAB — ABO/RH: ABO/RH(D): A POS

## 2010-09-12 LAB — PROTIME-INR
INR: 1.1 (ref 0.00–1.49)
Prothrombin Time: 14.4 seconds (ref 11.6–15.2)

## 2010-09-13 LAB — BASIC METABOLIC PANEL
CO2: 29 mEq/L (ref 19–32)
Calcium: 9.1 mg/dL (ref 8.4–10.5)
Creatinine, Ser: 0.66 mg/dL (ref 0.50–1.10)
GFR calc Af Amer: >60 mL/min (ref >60)
GFR calc non Af Amer: >60 mL/min (ref >60)
Sodium: 135 mEq/L (ref 135–145)

## 2010-09-13 LAB — CBC
MCHC: 31.9 g/dL (ref 30.0–36.0)
Platelets: 328 10*3/uL (ref 150–400)
RDW: 12.4 % (ref 11.5–15.5)
WBC: 12.5 10*3/uL — ABNORMAL HIGH (ref 4.0–10.5)

## 2010-09-13 LAB — PROTIME-INR
INR: 1.08 (ref 0.00–1.49)
Prothrombin Time: 14.2 seconds (ref 11.6–15.2)

## 2010-09-13 NOTE — Op Note (Signed)
NAMEBRAYLEE, LAL NO.:  [id number]  MEDICAL RECORD NO.:  [id number]  LOCATION:  [id number]                         FACILITY:  [facility]  PHYSICIAN:  Madlyn Frankel. Charlann Boxer, M.D.  DATE OF BIRTH:  Nov 13, 1945  DATE OF PROCEDURE:  09/12/2010 DATE OF DISCHARGE:                              OPERATIVE REPORT   PREOPERATIVE DIAGNOSES:  Left knee osteoarthritis with significant history of pulmonary embolus and deep vein thrombosis.  POSTOPERATIVE DIAGNOSES:  Left knee osteoarthritis with significant history of pulmonary embolus and deep vein thrombosis.  PROCEDURE:  Left total knee replacement utilizing DePuy component, size 2.5 femur, size 3 tibial tray, 10-mm insert to match the 2.5 femur, and a 35 patellar button.  SURGEON:  Madlyn Frankel. Charlann Boxer, MD  ASSISTANT:  Jaquelyn Bitter. Chabon, P.A.C.  ANESTHESIA:  Preoperative regional femoral nerve block plus general anesthetic.  SPECIMENS:  None.  COMPLICATIONS:  None.  DRAINS:  One Hemovac.  TOURNIQUET TIME:  36 minutes, 250 mmHg.  BLOOD LOSS:  Less than 100 cc.  INDICATIONS FOR PROCEDURE:  Ms. Selden is a 65 year old female who presented to the office and has been followed conservatively for knee arthritis for a significant amount of time.  She has a history of knee arthroscopy which was complicated by deep vein thrombosis and pulmonary embolus.  She had been worked up and was unable to have a clear diagnosis made and why she was to settle this.  She has been placed a longstanding Coumadin since.  After reviewing extensively the risks of the surgery, the postoperative complications of DVT, perioperative management of her DVT through her primary physician, the risks of infection in addition to the postop course and expectations.  Consent was obtained for benefit of pain relief.  PROCEDURE IN DETAIL:  The patient was brought to operative theater. Once adequate anesthesia, preoperative antibiotics, Ancef administered, she  was positioned supine with left thigh tourniquet placed.  The left lower extremity was then prepped and draped in sterile fashion, with left foot placed in DeMayo leg holder.  A time-out was performed identifying the patient, planned procedure and the extremity.  Leg was exsanguinated, tourniquet elevated to 250 mmHg.  A midline incision was made followed by median parapatellar arthrotomy.  Following initial exposure, attention was directed to patella.  Precut measurement was approximately 23 mm.  I resected down to about 14 mm and used a 35 patellar button to restore height and cover the cut surface.  The lug holes were drilled and a metal shim placed to protect the patella from retractors and saw blades.  Attention was now directed to femur.  Femoral canal was opened, drill irrigated to try to prevent fat emboli.  An intramedullary rod was passed at 3 degrees of valgus, 11 mm of bone resected off the distal femur due to osteophyte present.  Following this resection, attention was directed to tibia.  Tibia was subluxated anteriorly and using extramedullary guide I measured resection of 10 mm, it was taken off the proximal lateral tibia. Following this resection, I confirmed the gap would be stable with 10 mm insert and the cut was perpendicular in the coronal plane.  The femur was then sized to  be size 2.5.  The size 2.5 rotation block was then pinned into position, anterior referenced using a C clamp to set rotation.  The 4-in-1 cutting block was then pinned into position, anterior-posterior chamfer cuts made without difficulty nor notching. Final box cut was made of the lateral aspect of distal femur.  Following this resection, the tibia subluxated again anteriorly and cut surface after removing osteophytes seemed to be best fit with size 3 tibial tray, it was pinned into position, drilled and keel punched.  Trial reduction now carried out with 2.5 femur to place the 3 tibia and 10  mm insert.  The knee came to full extension.  The patella tracked through the trochlea without application of pressure.  I was happy with overall range of motion and stability.  The trial components were removed.  The synovial capsule junction of the knee was injected with 0.25% Marcaine with epinephrine, 1 cc of Toradol. The knee was irrigated with normal saline solution.  Final components were opened including polyethylene insert.  Final components were cemented on clean and dried cut surface of the bone.  The knee was brought to extension with 10 mm insert and extruded cement was removed. Once the cement had fully cured and excess cement was removed throughout the knee, a final 10-mm insert was placed and tourniquet had been let down after 36 minutes without significant hemostasis required.  The knee was re-irrigated.  A medium Hemovac drain was placed deep.  Extensor mechanism was then reapproximated using #1 Vicryl, the knee in flexion. The remainder of the wound was closed with 2-0 Vicryl, running 4-0 Monocryl.  The knee was cleaned, dried, and dressed sterilely using Dermabond and Aquacel dressing.  Drain site dressed separately.  She was then brought to recovery room in stable condition tolerating the procedure well.     Madlyn Frankel Charlann Boxer, M.D.     MDO/MEDQ  D:  09/12/2010  T:  09/12/2010  Job:  [id number]  Electronically Signed by Durene Romans M.D. on 09/13/2010 81:19:14 PM

## 2010-09-14 LAB — BASIC METABOLIC PANEL
Chloride: 104 mEq/L (ref 96–112)
Creatinine, Ser: 0.54 mg/dL (ref 0.50–1.10)
GFR calc Af Amer: >60 mL/min (ref >60)
Sodium: 140 mEq/L (ref 135–145)

## 2010-09-14 LAB — PROTIME-INR
INR: 1.43 (ref 0.00–1.49)
Prothrombin Time: 17.7 seconds — ABNORMAL HIGH (ref 11.6–15.2)

## 2010-09-14 LAB — CBC
MCV: 89.1 fL (ref 78.0–100.0)
Platelets: 254 10*3/uL (ref 150–400)
RDW: 12.5 % (ref 11.5–15.5)
WBC: 8.5 10*3/uL (ref 4.0–10.5)

## 2010-09-15 LAB — PROTIME-INR: INR: 1.5 — ABNORMAL HIGH (ref 0.00–1.49)

## 2010-10-04 ENCOUNTER — Ambulatory Visit: Payer: Commercial Managed Care - PPO | Attending: Orthopedic Surgery | Admitting: Physical Therapy

## 2010-10-04 DIAGNOSIS — R269 Unspecified abnormalities of gait and mobility: Secondary | ICD-10-CM | POA: Insufficient documentation

## 2010-10-04 DIAGNOSIS — IMO0001 Reserved for inherently not codable concepts without codable children: Secondary | ICD-10-CM | POA: Insufficient documentation

## 2010-10-04 DIAGNOSIS — M25569 Pain in unspecified knee: Secondary | ICD-10-CM | POA: Insufficient documentation

## 2010-10-04 DIAGNOSIS — M25669 Stiffness of unspecified knee, not elsewhere classified: Secondary | ICD-10-CM | POA: Insufficient documentation

## 2010-10-05 ENCOUNTER — Ambulatory Visit: Payer: Commercial Managed Care - PPO | Attending: Orthopedic Surgery | Admitting: Physical Therapy

## 2010-10-05 DIAGNOSIS — M25669 Stiffness of unspecified knee, not elsewhere classified: Secondary | ICD-10-CM | POA: Insufficient documentation

## 2010-10-05 DIAGNOSIS — M25569 Pain in unspecified knee: Secondary | ICD-10-CM | POA: Insufficient documentation

## 2010-10-05 DIAGNOSIS — IMO0001 Reserved for inherently not codable concepts without codable children: Secondary | ICD-10-CM | POA: Insufficient documentation

## 2010-10-05 DIAGNOSIS — R269 Unspecified abnormalities of gait and mobility: Secondary | ICD-10-CM | POA: Insufficient documentation

## 2010-10-07 ENCOUNTER — Encounter: Payer: Commercial Managed Care - PPO | Admitting: Physical Therapy

## 2010-10-10 ENCOUNTER — Ambulatory Visit: Payer: Commercial Managed Care - PPO | Admitting: Physical Therapy

## 2010-10-12 ENCOUNTER — Ambulatory Visit: Payer: Commercial Managed Care - PPO | Admitting: Physical Therapy

## 2010-10-13 ENCOUNTER — Ambulatory Visit: Payer: Commercial Managed Care - PPO | Admitting: Physical Therapy

## 2010-10-17 ENCOUNTER — Encounter: Payer: Commercial Managed Care - PPO | Admitting: Physical Therapy

## 2010-10-19 ENCOUNTER — Ambulatory Visit: Payer: Commercial Managed Care - PPO | Admitting: Physical Therapy

## 2010-10-21 ENCOUNTER — Ambulatory Visit: Payer: Commercial Managed Care - PPO | Admitting: Physical Therapy

## 2010-10-25 ENCOUNTER — Ambulatory Visit: Payer: Commercial Managed Care - PPO | Admitting: Physical Therapy

## 2010-10-26 ENCOUNTER — Ambulatory Visit: Payer: Commercial Managed Care - PPO

## 2010-10-28 ENCOUNTER — Ambulatory Visit: Payer: Commercial Managed Care - PPO | Admitting: Physical Therapy

## 2010-10-31 ENCOUNTER — Encounter: Payer: Commercial Managed Care - PPO | Admitting: Physical Therapy

## 2010-11-01 ENCOUNTER — Encounter: Payer: Commercial Managed Care - PPO | Admitting: Physical Therapy

## 2010-11-03 ENCOUNTER — Encounter: Payer: Commercial Managed Care - PPO | Admitting: Physical Therapy

## 2010-12-09 LAB — FOLATE: Folate: >20 ng/mL

## 2010-12-09 LAB — CBC
HCT: 41.1 % (ref 36.0–46.0)
Hemoglobin: 13.6 g/dL (ref 12.0–15.0)
MCV: 88.7 fL (ref 78.0–100.0)
Platelets: 315 10*3/uL (ref 150–400)
RBC: 4.32 MIL/uL (ref 3.87–5.11)
RDW: 12.8 % (ref 11.5–15.5)
WBC: 7.6 10*3/uL (ref 4.0–10.5)
WBC: 8.2 10*3/uL (ref 4.0–10.5)

## 2010-12-09 LAB — COMPREHENSIVE METABOLIC PANEL
ALT: 20 U/L (ref 0–35)
ALT: 21 U/L (ref 0–35)
AST: 22 U/L (ref 0–37)
AST: 28 U/L (ref 0–37)
Albumin: 3.1 g/dL — ABNORMAL LOW (ref 3.5–5.2)
Albumin: 3.7 g/dL (ref 3.5–5.2)
BUN: 12 mg/dL (ref 6–23)
CO2: 29 mEq/L (ref 19–32)
Calcium: 9.8 mg/dL (ref 8.4–10.5)
Chloride: 101 mEq/L (ref 96–112)
Chloride: 102 mEq/L (ref 96–112)
Creatinine, Ser: 0.73 mg/dL (ref 0.4–1.2)
GFR calc Af Amer: >60 mL/min (ref >60)
GFR calc non Af Amer: >60 mL/min (ref >60)
Glucose, Bld: 108 mg/dL — ABNORMAL HIGH (ref 70–99)
Potassium: 3.6 mEq/L (ref 3.5–5.1)
Sodium: 140 mEq/L (ref 135–145)
Total Bilirubin: 0.6 mg/dL (ref 0.3–1.2)
Total Protein: 7.1 g/dL (ref 6.0–8.3)

## 2010-12-09 LAB — MITOCHONDRIAL ANTIBODIES: Mitochondrial M2 Ab, IgG: <20.1 Units (ref <20.1)

## 2010-12-09 LAB — HOMOCYSTEINE: Homocysteine: 9.2 umol/L (ref 4.0–15.4)

## 2010-12-09 LAB — VITAMIN B12: Vitamin B-12: 567 pg/mL (ref 211–911)

## 2010-12-09 LAB — RETICULOCYTES
Retic Count, Absolute: 53.5 10*3/uL (ref 19.0–186.0)
Retic Ct Pct: 1.2 % (ref 0.4–3.1)

## 2010-12-09 LAB — IRON AND TIBC
Saturation Ratios: 14 % — ABNORMAL LOW (ref 20–55)
UIBC: 234 ug/dL

## 2010-12-09 LAB — LIPID PANEL
LDL Cholesterol: 108 mg/dL — ABNORMAL HIGH (ref 0–99)
Triglycerides: 207 mg/dL — ABNORMAL HIGH (ref <150)

## 2010-12-09 LAB — APTT: aPTT: 30 seconds (ref 24–37)

## 2010-12-09 LAB — PROTIME-INR
INR: 0.9 (ref 0.00–1.49)
Prothrombin Time: 12.6 seconds (ref 11.6–15.2)

## 2010-12-09 LAB — ANTI-SMOOTH MUSCLE ANTIBODY, IGG: F-Actin IgG: <20 U (ref <20)

## 2010-12-09 LAB — CARDIAC PANEL(CRET KIN+CKTOT+MB+TROPI): CK, MB: 1.7 ng/mL (ref 0.3–4.0)

## 2011-02-13 ENCOUNTER — Encounter: Payer: Self-pay | Admitting: *Deleted

## 2011-02-14 ENCOUNTER — Other Ambulatory Visit (HOSPITAL_BASED_OUTPATIENT_CLINIC_OR_DEPARTMENT_OTHER): Payer: Commercial Managed Care - PPO | Admitting: Lab

## 2011-02-14 ENCOUNTER — Other Ambulatory Visit: Payer: Self-pay | Admitting: Hematology and Oncology

## 2011-02-14 ENCOUNTER — Ambulatory Visit (HOSPITAL_BASED_OUTPATIENT_CLINIC_OR_DEPARTMENT_OTHER): Payer: Commercial Managed Care - PPO | Admitting: Hematology and Oncology

## 2011-02-14 VITALS — BP 146/68 | HR 85 | Temp 97.0°F | Ht 65.5 in | Wt 197.2 lb

## 2011-02-14 DIAGNOSIS — I749 Embolism and thrombosis of unspecified artery: Secondary | ICD-10-CM

## 2011-02-14 DIAGNOSIS — I2782 Chronic pulmonary embolism: Secondary | ICD-10-CM

## 2011-02-14 DIAGNOSIS — Z7901 Long term (current) use of anticoagulants: Secondary | ICD-10-CM

## 2011-02-14 LAB — PROTIME-INR: Protime: 32.4 Seconds — ABNORMAL HIGH (ref 10.6–13.4)

## 2011-02-14 NOTE — Progress Notes (Signed)
CC:   [name], M.D. [name] [name], M.D.  IDENTIFYING STATEMENT:  The patient is a 65 year old woman with a history of recurrent pulmonary embolism on lifelong anticoagulation who presents for followup.  HISTORY OF PRESENT ILLNESS:  The patient received a total knee replacement in summer and states she did very well.  There were no issues with bleeding.  She is ambulating very well.  She plans to have her other knee replaced within the next year or so.  She is managing Coumadin with very minimal bruising or bleeding.  She denies chest pain or shortness of breath.  Denies lower extremity or calf swelling.  MEDICATIONS:  Reviewed and updated.  PHYSICAL EXAMINATION:  Alert and oriented x3.  Vitals:  Pulse 85, blood pressure 146/68, temperature 97, respirations 20, weight 197.2 pounds. HEENT:  Head is atraumatic, normocephalic.  Sclerae anicteric.  Mouth moist.  Chest/CVS:  Unremarkable:  Abdomen:  Soft.  Extremities:  No edema.  Skin:  No bruising or petechiae.  LABORATORY DATA:  INR 2.7.  IMPRESSION/PLAN:  [name] [name] is a 65 year old woman with a history of recurrent pulmonary embolism who is on lifelong anticoagulation.  She is tolerating Coumadin and appears to be doing well with no evidence of bruising or bleeding.  She follows up in a year's time for continued assessment.    ______________________________ Laurice Record, M.D. LIO/MEDQ  D:  02/14/2011  T:  02/14/2011  Job:  [id number]

## 2011-02-14 NOTE — Progress Notes (Signed)
This office note has been dictated.

## 2011-02-15 ENCOUNTER — Telehealth: Payer: Self-pay | Admitting: Hematology and Oncology

## 2011-02-15 NOTE — Telephone Encounter (Signed)
lmonvm for pt re appt for 02/17/2012, dec 2013 appt mailed today. Appt scheduled per 02/14/2011 elec pof,

## 2011-03-17 ENCOUNTER — Other Ambulatory Visit: Payer: Self-pay | Admitting: Otolaryngology

## 2011-03-17 DIAGNOSIS — R200 Anesthesia of skin: Secondary | ICD-10-CM

## 2011-03-17 DIAGNOSIS — H905 Unspecified sensorineural hearing loss: Secondary | ICD-10-CM

## 2011-03-20 ENCOUNTER — Ambulatory Visit
Admission: RE | Admit: 2011-03-20 | Discharge: 2011-03-20 | Disposition: A | Discharge status: Home / Self Care | Payer: Commercial Managed Care - PPO | Source: Ambulatory Visit | Attending: Otolaryngology | Admitting: Otolaryngology

## 2011-03-20 DIAGNOSIS — R200 Anesthesia of skin: Secondary | ICD-10-CM

## 2011-03-20 DIAGNOSIS — H905 Unspecified sensorineural hearing loss: Secondary | ICD-10-CM

## 2011-03-20 MED ORDER — GADOBENATE DIMEGLUMINE 529 MG/ML IV SOLN
18.0000 mL | Freq: Once | INTRAVENOUS | Status: AC | PRN
  Administered 2011-03-20: 18 mL via INTRAVENOUS

## 2011-05-08 ENCOUNTER — Telehealth: Payer: Self-pay | Admitting: *Deleted

## 2011-05-08 NOTE — Telephone Encounter (Signed)
Received call from pt re:  Pt is scheduled for right knee replacement surgery  On 05/22/11.    Pt is currently taking coumadin  5 mg  X 5 days ;  2.5 mg  X 2 days   Per Dr. Corliss Blacker.   Pt would like to know if she should come in to see Dr. Dalene Carrow for further instructions on anticoagulation prior to her upcoming surgery.    Informed pt that message will be relayed to md for 3/5 since md was not in office today.   Pt voiced understanding. Pt's   Phone     [phone].

## 2011-05-09 ENCOUNTER — Telehealth: Payer: Self-pay | Admitting: *Deleted

## 2011-05-09 NOTE — Telephone Encounter (Signed)
Spoke with pt and instructed pt to have the surgeon to send a note from his office to Dr. Dalene Carrow  About pt's upcoming knee surgery.    Informed pt that md will send instructions back to her surgeon.   Pt stated she has appt with Dr. Durene Romans on 05/10/11.   Pt will see his PA and will ask him to fax notes to our office.   Gave pt direct nurses' fax number.

## 2011-05-10 ENCOUNTER — Encounter: Payer: Self-pay | Admitting: Hematology and Oncology

## 2011-05-10 ENCOUNTER — Encounter (HOSPITAL_COMMUNITY): Payer: Self-pay | Admitting: Pharmacy Technician

## 2011-05-10 NOTE — Progress Notes (Signed)
H&P performed 05/10/2011 Dictation # [id number]

## 2011-05-11 ENCOUNTER — Telehealth: Payer: Self-pay | Admitting: *Deleted

## 2011-05-11 ENCOUNTER — Encounter (HOSPITAL_COMMUNITY)
Admission: RE | Admit: 2011-05-11 | Discharge: 2011-05-11 | Disposition: A | Discharge status: Home / Self Care | Payer: Commercial Managed Care - PPO | Source: Ambulatory Visit | Attending: Orthopedic Surgery | Admitting: Orthopedic Surgery

## 2011-05-11 ENCOUNTER — Encounter (HOSPITAL_COMMUNITY): Payer: Self-pay

## 2011-05-11 HISTORY — DX: Adverse effect of unspecified anesthetic, initial encounter: T41.45XA

## 2011-05-11 HISTORY — DX: Other complications of anesthesia, initial encounter: T88.59XA

## 2011-05-11 HISTORY — DX: Hypothyroidism, unspecified: E03.9

## 2011-05-11 HISTORY — DX: Chronic sinusitis, unspecified: J32.9

## 2011-05-11 HISTORY — DX: Peripheral vascular disease, unspecified: I73.9

## 2011-05-11 HISTORY — DX: Unspecified osteoarthritis, unspecified site: M19.90

## 2011-05-11 LAB — COMPREHENSIVE METABOLIC PANEL
ALT: 17 U/L (ref 0–35)
AST: 23 U/L (ref 0–37)
Albumin: 3.9 g/dL (ref 3.5–5.2)
Alkaline Phosphatase: 70 U/L (ref 39–117)
BUN: 15 mg/dL (ref 6–23)
Chloride: 103 mEq/L (ref 96–112)
Potassium: 4.7 mEq/L (ref 3.5–5.1)
Total Bilirubin: 0.3 mg/dL (ref 0.3–1.2)

## 2011-05-11 LAB — URINALYSIS, ROUTINE W REFLEX MICROSCOPIC
Bilirubin Urine: NEGATIVE
Hgb urine dipstick: NEGATIVE
Ketones, ur: NEGATIVE mg/dL
Nitrite: NEGATIVE
Specific Gravity, Urine: 1.018 (ref 1.005–1.030)
pH: 7.5 (ref 5.0–8.0)

## 2011-05-11 LAB — CBC
HCT: 39.9 % (ref 36.0–46.0)
Platelets: 353 10*3/uL (ref 150–400)
RDW: 12.9 % (ref 11.5–15.5)
WBC: 7.5 10*3/uL (ref 4.0–10.5)

## 2011-05-11 LAB — DIFFERENTIAL
Basophils Relative: 1 % (ref 0–1)
Eosinophils Absolute: 0.3 10*3/uL (ref 0.0–0.7)
Eosinophils Relative: 3 % (ref 0–5)
Monocytes Absolute: 0.7 10*3/uL (ref 0.1–1.0)
Monocytes Relative: 10 % (ref 3–12)

## 2011-05-11 LAB — URINE MICROSCOPIC-ADD ON

## 2011-05-11 LAB — PROTIME-INR: INR: 2.44 — ABNORMAL HIGH (ref 0.00–1.49)

## 2011-05-11 LAB — APTT: aPTT: 48 seconds — ABNORMAL HIGH (ref 24–37)

## 2011-05-11 LAB — SURGICAL PCR SCREEN: Staphylococcus aureus: NEGATIVE

## 2011-05-11 MED ORDER — CHLORHEXIDINE GLUCONATE 4 % EX LIQD: 60.0000 mL | Freq: Once | CUTANEOUS | Status: DC

## 2011-05-11 NOTE — Pre-Procedure Instructions (Signed)
States has been instructed when to begin Lovonox and stop coumadin per office, also when to stop herbal meds- OV Dr Charlann Boxer 05/10/11

## 2011-05-11 NOTE — Pre-Procedure Instructions (Signed)
Spoke with [facility] who will notify Toni Amend, Dr Boneta Lucks nurse of abnormal urine   1700

## 2011-05-11 NOTE — Patient Instructions (Addendum)
20 [name] [name]  05/11/2011   Your procedure is scheduled on:  05/22/11   Surgery [id number]   Monday  Report to [facility] at   1030    AM.  Call this number if you have problems the morning of surgery: [phone]     Or PST   [id number]  [facility]   Remember:   Do not eat food:After Midnight.  Sunday NIGHT  May have clear liquids: until MIDNIGHT  Sunday NIGHT  Clear liquids include soda, tea, black coffee, apple or grape juice, broth.  Take these medicines the morning of surgery with A SIP OF WATER: NEURONTIN,SYNTHROID, EFFEXOR   With sip water   Do not wear jewelry, make-up or nail polish.  Do not wear lotions, powders, or perfumes. You may wear deodorant.  Do not shave 48 hours prior to surgery.  Do not bring valuables to the hospital.  Contacts, dentures or bridgework may not be worn into surgery.  Leave suitcase in the car. After surgery it may be brought to your room.  For patients admitted to the hospital, checkout time is 11:00 AM the day of discharge.   Patients discharged the day of surgery will not be allowed to drive home.  Name and phone number of your driver:                                                                      Special Instructions: CHG Shower Use Special Wash: 1/2 bottle night before surgery and 1/2 bottle morning of surgery. REGULAR SOAP FACE AND PRIVATES              LADIES- NO SHAVING 48 HOURS BEFORE USING BETASEPT SOAP.                   Please read over the following fact sheets that you were given: MRSA Information

## 2011-05-11 NOTE — Telephone Encounter (Signed)
Faxed letter of recommendation from Dr. Dalene Carrow to  Drs.  Luna Fuse,  Hart Rochester,  And  Leilani Able, PA-C  Regarding pt's upcoming knee surgery.    Spoke with pt and informed her of same info.

## 2011-05-11 NOTE — H&P (Signed)
[name]  TINIKA, BUCKNAM NO.:  [id number]  MEDICAL RECORD NO.:  [id number]  LOCATION:  PERIO                        FACILITY:  [facility]  PHYSICIAN:  Madlyn Frankel. Charlann Boxer, M.D.  DATE OF BIRTH:  [birth year]  DATE OF ADMISSION:  04/25/2011 DATE OF DISCHARGE:                             HISTORY & PHYSICAL   DATE OF SURGERY:  May 22, 2011.  ADMITTING DIAGNOSIS:  End-stage osteoarthritis of the right knee.  HISTORY OF PRESENT ILLNESS:  This is a 66 year old lady with a history of end-stage osteoarthritis of her right knee that has failed conservative treatment to alleviate her pain.  She has had a previous total knee arthroplasty on the left knee and done quite well from that and now is scheduled for total knee arthroplasty of the right knee.  The surgery risks, benefits, and aftercare were discussed with the patient. Questions invited and answered.  She does have a history of a DVT with PE and is on chronic Coumadin therapy.  She will go on bridging Lovenox 5 days before surgery and then bridging Lovenox postoperatively until her Coumadin is back up to therapeutic levels.  She understands the increased risk of DVT/PE and possibly even death due to her history. Otherwise, surgery will go ahead as scheduled.  Note that she is not a candidate for tranexamic acid or dexamethasone and will not receive that at preop.  She does plan on going home after surgery.  She is not given her home medications today.  PAST MEDICAL HISTORY:  Drug allergies to, 1. LYRICA with hallucinations. 2. CRESTOR with muscle aches. 3. ERYTHROMYCIN with rash.  CURRENT MEDICATIONS:  Include, 1. Calcium plus D two tablets daily. 2. Iron 325 mg one daily. 3. Synthroid 150 mcg one daily. 4. Furosemide 20 mg one daily. 5. Benicar 40 mg one daily. 6. Effexor XR 75 mg daily. 7. Fenofibrate 200 mg one daily. 8. Super B-complex 1 daily. 9. Fish oil 1200 mg. 10.Lidoderm patches to the knee 12 hours on, 12  hours off. 11.Aspirin 81 mg daily. 12.Gabapentin 100 mg two t.i.d. 13.Vitamin D3, 5000 units one daily. 14.Coumadin 5 mg one-half tablet Monday and Thursday and a whole     tablet the other 5 days of the week.  SERIOUS MEDICAL ILLNESSES:  Include hypertension, hyperlipidemia, hypothyroidism, depression, and history of DVT with PE.  PREVIOUS SURGERIES:  Include exploratory laparotomy, uterine suspension, cholecystectomy, hysterectomy, glaucoma, and cataracts.  FAMILY HISTORY:  Positive for stroke and heart disease.  SOCIAL HISTORY:  The patient is widowed.  She lives alone.  She does not smoke or drink.  REVIEW OF SYSTEMS:  CENTRAL NERVOUS SYSTEM:  Positive for occasional headache and depression.  PULMONARY:  Positive for exertional shortness of breath.  Negative for PND or orthopnea.  CARDIOVASCULAR:  Negative for chest pain or palpitation.  Positive for history of DVT with PE. GI:  Negative for ulcers or hepatitis.  GU:  Negative for UTIs. Positive for occasional incontinence.  MUSCULOSKELETAL:  Positive in HPI.  PHYSICAL EXAMINATION:  VITAL SIGNS:  BP 140/81, respirations 14, pulse 72 and regular. GENERAL APPEARANCE:  This is a well-developed, well-nourished lady, in no acute distress. HEENT:  Head is  normocephalic.  Nose is patent.  Ears are patent. Pupils are equal, round, and reactive to light.  Throat is without injection. NECK:  Supple without adenopathy.  Carotids are 2+ without bruit. CHEST:  Clear to auscultation.  No rales or rhonchi.  Respirations are 14. HEART:  Regular rate and rhythm at 72 beats per minute without murmur. ABDOMEN:  Soft with active bowel sounds.  No masses or organomegaly. NEUROLOGIC:  The patient is alert and oriented to time, place, and person.  Cranial nerves II-XII are grossly intact. EXTREMITIES:  Show the left knee status post total knee arthroplasty with well-healed scar, full extension and further flexion to 125 degrees, excellent  stability.  No redness, swelling, or warmth.  No calf tenderness.  No cords.  Negative Homans sign.  Right knee shows a slight varus deformity.  She has 5 degrees from full extension, further flexion to 115 degrees with pain.  Sensation and circulation are intact.  Calf is negative.  No cords.  Negative Homans sign.  Sensation and circulation are intact.  ASSESSMENT:  End-stage osteoarthritis, right knee.  PLAN:  Total knee arthroplasty of the right knee.  Note again, she will stop her Coumadin 5 days before surgery though on bridging Lovenox and will need bridging Lovenox postoperatively.  Note that her medical doctor is Dr. Gweneth Dimitri and her hematologist is Dr. Arlan Organ.     Jaquelyn [name]. [name] [name], P.A.   ______________________________ Madlyn Frankel Charlann Boxer, M.D.    SJC/MEDQ  D:  05/10/2011  T:  05/11/2011  Job:  [id number]

## 2011-05-12 ENCOUNTER — Telehealth: Payer: Self-pay | Admitting: *Deleted

## 2011-05-12 NOTE — Telephone Encounter (Signed)
Pt called wanting to know exactly how much Lovenox dosage pt will need to receive prior to surgery.   Pt current weight  Is  195 lbs.    Pt stated she had received higher dosage Lovenox than recommended by Dr. Dalene Carrow with pt's previous surgery.    Pt would like to know from Dr. Dalene Carrow. Pt's   Phone      [phone].

## 2011-05-17 ENCOUNTER — Encounter (HOSPITAL_COMMUNITY): Payer: Self-pay

## 2011-05-17 NOTE — Pre-Procedure Instructions (Signed)
Spoke with Bonita Quin and informed her of new time to be in Short Stay- 1100 day of surgery

## 2011-05-22 ENCOUNTER — Encounter (HOSPITAL_COMMUNITY): Payer: Self-pay | Admitting: Anesthesiology

## 2011-05-22 ENCOUNTER — Encounter (HOSPITAL_COMMUNITY)
Admission: RE | Disposition: A | Discharge status: Home Health | Payer: Self-pay | Source: Ambulatory Visit | Attending: Orthopedic Surgery

## 2011-05-22 ENCOUNTER — Inpatient Hospital Stay (HOSPITAL_COMMUNITY)
Admission: RE | Admit: 2011-05-22 | Discharge: 2011-05-24 | DRG: 470 | Disposition: A | Discharge status: Home Health | Payer: Commercial Managed Care - PPO | Source: Ambulatory Visit | Attending: Orthopedic Surgery | Admitting: Orthopedic Surgery

## 2011-05-22 ENCOUNTER — Encounter (HOSPITAL_COMMUNITY): Payer: Self-pay | Admitting: *Deleted

## 2011-05-22 ENCOUNTER — Ambulatory Visit (HOSPITAL_COMMUNITY): Payer: Commercial Managed Care - PPO | Admitting: Anesthesiology

## 2011-05-22 DIAGNOSIS — F3289 Other specified depressive episodes: Secondary | ICD-10-CM | POA: Diagnosis present

## 2011-05-22 DIAGNOSIS — F329 Major depressive disorder, single episode, unspecified: Secondary | ICD-10-CM | POA: Diagnosis present

## 2011-05-22 DIAGNOSIS — E785 Hyperlipidemia, unspecified: Secondary | ICD-10-CM | POA: Diagnosis present

## 2011-05-22 DIAGNOSIS — I10 Essential (primary) hypertension: Secondary | ICD-10-CM | POA: Diagnosis present

## 2011-05-22 DIAGNOSIS — E039 Hypothyroidism, unspecified: Secondary | ICD-10-CM | POA: Diagnosis present

## 2011-05-22 DIAGNOSIS — Z86718 Personal history of other venous thrombosis and embolism: Secondary | ICD-10-CM | POA: Diagnosis not present

## 2011-05-22 DIAGNOSIS — Z01812 Encounter for preprocedural laboratory examination: Secondary | ICD-10-CM | POA: Diagnosis not present

## 2011-05-22 DIAGNOSIS — Z96659 Presence of unspecified artificial knee joint: Secondary | ICD-10-CM | POA: Diagnosis not present

## 2011-05-22 DIAGNOSIS — M1710 Unilateral primary osteoarthritis, unspecified knee: Secondary | ICD-10-CM | POA: Diagnosis present

## 2011-05-22 DIAGNOSIS — I749 Embolism and thrombosis of unspecified artery: Secondary | ICD-10-CM

## 2011-05-22 DIAGNOSIS — Z86711 Personal history of pulmonary embolism: Secondary | ICD-10-CM

## 2011-05-22 HISTORY — DX: Presence of unspecified artificial knee joint: Z96.659

## 2011-05-22 HISTORY — PX: TOTAL KNEE ARTHROPLASTY: SHX125

## 2011-05-22 LAB — TYPE AND SCREEN: ABO/RH(D): A POS

## 2011-05-22 SURGERY — ARTHROPLASTY, KNEE, TOTAL
Anesthesia: General | Site: Knee | Laterality: Right | Wound class: Clean

## 2011-05-22 MED ORDER — LIDOCAINE HCL (CARDIAC) 20 MG/ML IV SOLN
INTRAVENOUS | Status: DC | PRN
  Administered 2011-05-22: 80 mg via INTRAVENOUS

## 2011-05-22 MED ORDER — BUPIVACAINE-EPINEPHRINE PF 0.25-1:200000 % IJ SOLN
INTRAMUSCULAR | Status: DC | PRN
  Administered 2011-05-22: 60 mL

## 2011-05-22 MED ORDER — MIDAZOLAM HCL 5 MG/5ML IJ SOLN
INTRAMUSCULAR | Status: DC | PRN
  Administered 2011-05-22: 2 mg via INTRAVENOUS

## 2011-05-22 MED ORDER — PHENOL 1.4 % MT LIQD: 1.0000 | OROMUCOSAL | Status: DC | PRN

## 2011-05-22 MED ORDER — DOCUSATE SODIUM 100 MG PO CAPS
100.0000 mg | ORAL_CAPSULE | Freq: Two times a day (BID) | ORAL | Status: DC
  Administered 2011-05-22 – 2011-05-24 (×4): 100 mg via ORAL
  Filled 2011-05-22 (×3): qty 1

## 2011-05-22 MED ORDER — ACETAMINOPHEN 10 MG/ML IV SOLN
INTRAVENOUS | Status: DC | PRN
  Administered 2011-05-22: 1000 mg via INTRAVENOUS

## 2011-05-22 MED ORDER — DIPHENHYDRAMINE HCL 50 MG/ML IJ SOLN
12.5000 mg | Freq: Once | INTRAMUSCULAR | Status: AC
  Administered 2011-05-22: 12.5 mg via INTRAVENOUS

## 2011-05-22 MED ORDER — IRBESARTAN 300 MG PO TABS
300.0000 mg | ORAL_TABLET | Freq: Every day | ORAL | Status: DC
  Administered 2011-05-22 – 2011-05-24 (×3): 300 mg via ORAL
  Filled 2011-05-22 (×3): qty 1

## 2011-05-22 MED ORDER — GABAPENTIN 100 MG PO CAPS
200.0000 mg | ORAL_CAPSULE | Freq: Every day | ORAL | Status: DC
  Administered 2011-05-23 – 2011-05-24 (×2): 200 mg via ORAL
  Filled 2011-05-22 (×2): qty 2

## 2011-05-22 MED ORDER — CEFAZOLIN SODIUM-DEXTROSE 2-3 GM-% IV SOLR
2.0000 g | Freq: Once | INTRAVENOUS | Status: AC
  Administered 2011-05-22: 2 g via INTRAVENOUS

## 2011-05-22 MED ORDER — MENTHOL 3 MG MT LOZG: 1.0000 | LOZENGE | OROMUCOSAL | Status: DC | PRN

## 2011-05-22 MED ORDER — DEXTROSE 5 % IV SOLN
500.0000 mg | Freq: Four times a day (QID) | INTRAVENOUS | Status: DC | PRN
  Administered 2011-05-22: 500 mg via INTRAVENOUS
  Filled 2011-05-22: qty 5

## 2011-05-22 MED ORDER — METHOCARBAMOL 500 MG PO TABS
500.0000 mg | ORAL_TABLET | Freq: Four times a day (QID) | ORAL | Status: DC | PRN
  Administered 2011-05-23 (×2): 500 mg via ORAL
  Filled 2011-05-22 (×2): qty 1

## 2011-05-22 MED ORDER — ONDANSETRON HCL 4 MG/2ML IJ SOLN
INTRAMUSCULAR | Status: DC | PRN
  Administered 2011-05-22: 2 mg via INTRAVENOUS
  Administered 2011-05-22: 1 mg via INTRAVENOUS

## 2011-05-22 MED ORDER — ONDANSETRON HCL 4 MG PO TABS: 4.0000 mg | ORAL_TABLET | Freq: Four times a day (QID) | ORAL | Status: DC | PRN

## 2011-05-22 MED ORDER — ONDANSETRON HCL 4 MG/2ML IJ SOLN
4.0000 mg | Freq: Four times a day (QID) | INTRAMUSCULAR | Status: DC | PRN
  Administered 2011-05-23: 4 mg via INTRAVENOUS
  Filled 2011-05-22: qty 2

## 2011-05-22 MED ORDER — GLYCOPYRROLATE 0.2 MG/ML IJ SOLN
INTRAMUSCULAR | Status: DC | PRN
  Administered 2011-05-22: .4 mg via INTRAVENOUS

## 2011-05-22 MED ORDER — BISACODYL 5 MG PO TBEC: 5.0000 mg | DELAYED_RELEASE_TABLET | Freq: Every day | ORAL | Status: DC | PRN

## 2011-05-22 MED ORDER — DIPHENHYDRAMINE HCL 50 MG/ML IJ SOLN
INTRAMUSCULAR | Status: AC
  Administered 2011-05-22: 12.5 mg via INTRAVENOUS
  Filled 2011-05-22: qty 1

## 2011-05-22 MED ORDER — PROMETHAZINE HCL 25 MG/ML IJ SOLN: 6.2500 mg | INTRAMUSCULAR | Status: DC | PRN

## 2011-05-22 MED ORDER — LEVOTHYROXINE SODIUM 150 MCG PO TABS
150.0000 ug | ORAL_TABLET | Freq: Every day | ORAL | Status: DC
  Administered 2011-05-23 – 2011-05-24 (×2): 150 ug via ORAL
  Filled 2011-05-22 (×2): qty 1

## 2011-05-22 MED ORDER — KETOROLAC TROMETHAMINE 15 MG/ML IJ SOLN
7.5000 mg | Freq: Three times a day (TID) | INTRAMUSCULAR | Status: DC
  Administered 2011-05-22 – 2011-05-24 (×5): 7.5 mg via INTRAVENOUS
  Filled 2011-05-22 (×6): qty 1

## 2011-05-22 MED ORDER — FENTANYL CITRATE 0.05 MG/ML IJ SOLN
INTRAMUSCULAR | Status: DC | PRN
  Administered 2011-05-22 (×2): 50 ug via INTRAVENOUS
  Administered 2011-05-22: 100 ug via INTRAVENOUS
  Administered 2011-05-22: 50 ug via INTRAVENOUS

## 2011-05-22 MED ORDER — MAGNESIUM 250 MG PO TABS: 1.0000 | ORAL_TABLET | Freq: Every day | ORAL | Status: DC

## 2011-05-22 MED ORDER — HYDROCODONE-ACETAMINOPHEN 7.5-325 MG PO TABS
1.0000 | ORAL_TABLET | ORAL | Status: DC
  Administered 2011-05-22 – 2011-05-23 (×2): 1 via ORAL
  Administered 2011-05-23 (×2): 2 via ORAL
  Administered 2011-05-23: 1 via ORAL
  Administered 2011-05-23: 2 via ORAL
  Administered 2011-05-23: 1 via ORAL
  Administered 2011-05-24 (×4): 2 via ORAL
  Filled 2011-05-22 (×5): qty 2
  Filled 2011-05-22: qty 1
  Filled 2011-05-22: qty 2
  Filled 2011-05-22 (×2): qty 1
  Filled 2011-05-22 (×2): qty 2

## 2011-05-22 MED ORDER — CEFAZOLIN SODIUM-DEXTROSE 2-3 GM-% IV SOLR
2.0000 g | Freq: Four times a day (QID) | INTRAVENOUS | Status: AC
  Administered 2011-05-22 – 2011-05-23 (×3): 2 g via INTRAVENOUS
  Filled 2011-05-22 (×3): qty 50

## 2011-05-22 MED ORDER — HYDROMORPHONE HCL PF 1 MG/ML IJ SOLN
INTRAMUSCULAR | Status: AC
  Administered 2011-05-22: 0.5 mg via INTRAVENOUS
  Filled 2011-05-22: qty 1

## 2011-05-22 MED ORDER — VENLAFAXINE HCL ER 75 MG PO CP24
75.0000 mg | ORAL_CAPSULE | Freq: Every day | ORAL | Status: DC
  Administered 2011-05-23 – 2011-05-24 (×2): 75 mg via ORAL
  Filled 2011-05-22 (×2): qty 1

## 2011-05-22 MED ORDER — HYDROMORPHONE HCL PF 1 MG/ML IJ SOLN
0.5000 mg | INTRAMUSCULAR | Status: DC | PRN
  Administered 2011-05-23: 1 mg via INTRAVENOUS
  Filled 2011-05-22: qty 1

## 2011-05-22 MED ORDER — MEPERIDINE HCL 50 MG/ML IJ SOLN: 6.2500 mg | INTRAMUSCULAR | Status: DC | PRN

## 2011-05-22 MED ORDER — LACTATED RINGERS IV SOLN
INTRAVENOUS | Status: DC
  Administered 2011-05-22: 1000 mL via INTRAVENOUS
  Administered 2011-05-22: 15:00:00 via INTRAVENOUS

## 2011-05-22 MED ORDER — MAGNESIUM OXIDE 400 MG PO TABS
200.0000 mg | ORAL_TABLET | Freq: Every day | ORAL | Status: DC
  Administered 2011-05-23: 200 mg via ORAL
  Filled 2011-05-22 (×2): qty 0.5

## 2011-05-22 MED ORDER — POLYETHYL GLYCOL-PROPYL GLYCOL 0.4-0.3 % OP SOLN: 1.0000 [drp] | Freq: Two times a day (BID) | OPHTHALMIC | Status: DC

## 2011-05-22 MED ORDER — LACTATED RINGERS IV SOLN
INTRAVENOUS | Status: DC
  Administered 2011-05-22: 17:00:00 via INTRAVENOUS

## 2011-05-22 MED ORDER — CELECOXIB 200 MG PO CAPS
200.0000 mg | ORAL_CAPSULE | Freq: Every day | ORAL | Status: DC
Start: 2011-05-22 — End: 2011-05-24
  Administered 2011-05-22 – 2011-05-24 (×3): 200 mg via ORAL
  Filled 2011-05-22 (×3): qty 1

## 2011-05-22 MED ORDER — FERROUS SULFATE 325 (65 FE) MG PO TABS
325.0000 mg | ORAL_TABLET | Freq: Three times a day (TID) | ORAL | Status: DC
  Administered 2011-05-23 – 2011-05-24 (×3): 325 mg via ORAL
  Filled 2011-05-22 (×3): qty 1

## 2011-05-22 MED ORDER — DEXAMETHASONE SODIUM PHOSPHATE 10 MG/ML IJ SOLN
10.0000 mg | Freq: Once | INTRAMUSCULAR | Status: AC
  Administered 2011-05-23: 10 mg via INTRAVENOUS
  Filled 2011-05-22: qty 1

## 2011-05-22 MED ORDER — DIPHENHYDRAMINE HCL 25 MG PO CAPS
25.0000 mg | ORAL_CAPSULE | Freq: Four times a day (QID) | ORAL | Status: DC | PRN
  Administered 2011-05-22 – 2011-05-24 (×3): 25 mg via ORAL
  Filled 2011-05-22 (×3): qty 1

## 2011-05-22 MED ORDER — FUROSEMIDE 20 MG PO TABS
20.0000 mg | ORAL_TABLET | Freq: Every day | ORAL | Status: DC | PRN
  Filled 2011-05-22: qty 1

## 2011-05-22 MED ORDER — FENOFIBRATE 160 MG PO TABS
160.0000 mg | ORAL_TABLET | Freq: Every day | ORAL | Status: DC
  Administered 2011-05-22 – 2011-05-24 (×3): 160 mg via ORAL
  Filled 2011-05-22 (×3): qty 1

## 2011-05-22 MED ORDER — ROCURONIUM BROMIDE 100 MG/10ML IV SOLN
INTRAVENOUS | Status: DC | PRN
  Administered 2011-05-22: 30 mg via INTRAVENOUS

## 2011-05-22 MED ORDER — PROPOFOL 10 MG/ML IV EMUL
INTRAVENOUS | Status: DC | PRN
  Administered 2011-05-22: 150 mg via INTRAVENOUS

## 2011-05-22 MED ORDER — WARFARIN SODIUM 7.5 MG PO TABS
7.5000 mg | ORAL_TABLET | Freq: Once | ORAL | Status: DC
  Filled 2011-05-22: qty 1

## 2011-05-22 MED ORDER — SUCCINYLCHOLINE CHLORIDE 20 MG/ML IJ SOLN
INTRAMUSCULAR | Status: DC | PRN
  Administered 2011-05-22: 100 mg via INTRAVENOUS

## 2011-05-22 MED ORDER — GABAPENTIN 100 MG PO CAPS
200.0000 mg | ORAL_CAPSULE | Freq: Every day | ORAL | Status: DC
  Administered 2011-05-22: 200 mg via ORAL
  Filled 2011-05-22: qty 2

## 2011-05-22 MED ORDER — KETOROLAC TROMETHAMINE 30 MG/ML IJ SOLN
INTRAMUSCULAR | Status: DC | PRN
  Administered 2011-05-22: 30 mg via INTRAVENOUS

## 2011-05-22 MED ORDER — POLYVINYL ALCOHOL 1.4 % OP SOLN
1.0000 [drp] | Freq: Two times a day (BID) | OPHTHALMIC | Status: DC
  Administered 2011-05-22: 2 [drp] via OPHTHALMIC
  Administered 2011-05-23 (×2): 1 [drp] via OPHTHALMIC
  Administered 2011-05-24: 2 [drp] via OPHTHALMIC
  Filled 2011-05-22: qty 15

## 2011-05-22 MED ORDER — WARFARIN - PHARMACIST DOSING INPATIENT: Freq: Every day | Status: DC

## 2011-05-22 MED ORDER — CEFAZOLIN SODIUM 1-5 GM-% IV SOLN
INTRAVENOUS | Status: AC
  Filled 2011-05-22: qty 100

## 2011-05-22 MED ORDER — HYDROMORPHONE HCL PF 1 MG/ML IJ SOLN
0.2500 mg | INTRAMUSCULAR | Status: DC | PRN
  Administered 2011-05-22 (×3): 0.5 mg via INTRAVENOUS

## 2011-05-22 MED ORDER — ALUM & MAG HYDROXIDE-SIMETH 200-200-20 MG/5ML PO SUSP
30.0000 mL | ORAL | Status: DC | PRN
  Administered 2011-05-23 – 2011-05-24 (×2): 30 mL via ORAL
  Filled 2011-05-22 (×3): qty 30

## 2011-05-22 MED ORDER — BUPIVACAINE-EPINEPHRINE PF 0.25-1:200000 % IJ SOLN
INTRAMUSCULAR | Status: AC
  Filled 2011-05-22: qty 60

## 2011-05-22 MED ORDER — METOCLOPRAMIDE HCL 10 MG PO TABS: 5.0000 mg | ORAL_TABLET | Freq: Three times a day (TID) | ORAL | Status: DC | PRN

## 2011-05-22 MED ORDER — ZOLPIDEM TARTRATE 5 MG PO TABS
5.0000 mg | ORAL_TABLET | Freq: Every evening | ORAL | Status: DC | PRN
  Administered 2011-05-23: 5 mg via ORAL
  Administered 2011-05-23: 10 mg via ORAL
  Filled 2011-05-22: qty 1
  Filled 2011-05-22: qty 2

## 2011-05-22 MED ORDER — 0.9 % SODIUM CHLORIDE (POUR BTL) OPTIME
TOPICAL | Status: DC | PRN
  Administered 2011-05-22: 1000 mL

## 2011-05-22 MED ORDER — POLYETHYLENE GLYCOL 3350 17 G PO PACK
17.0000 g | PACK | Freq: Two times a day (BID) | ORAL | Status: DC
  Administered 2011-05-22 – 2011-05-24 (×4): 17 g via ORAL
  Filled 2011-05-22 (×4): qty 1

## 2011-05-22 MED ORDER — HYDROMORPHONE HCL PF 1 MG/ML IJ SOLN
INTRAMUSCULAR | Status: AC
  Filled 2011-05-22: qty 1

## 2011-05-22 MED ORDER — FLEET ENEMA 7-19 GM/118ML RE ENEM: 1.0000 | ENEMA | Freq: Once | RECTAL | Status: AC | PRN

## 2011-05-22 MED ORDER — KETOROLAC TROMETHAMINE 30 MG/ML IJ SOLN
INTRAMUSCULAR | Status: AC
  Filled 2011-05-22: qty 1

## 2011-05-22 MED ORDER — ACETAMINOPHEN 10 MG/ML IV SOLN
INTRAVENOUS | Status: AC
  Filled 2011-05-22: qty 100

## 2011-05-22 MED ORDER — HYDROMORPHONE HCL PF 1 MG/ML IJ SOLN
INTRAMUSCULAR | Status: DC | PRN
  Administered 2011-05-22: .5 mg via INTRAVENOUS
  Administered 2011-05-22: 1 mg via INTRAVENOUS
  Administered 2011-05-22: 0.5 mg via INTRAVENOUS

## 2011-05-22 MED ORDER — SODIUM CHLORIDE 0.9 % IV SOLN
INTRAVENOUS | Status: DC
  Administered 2011-05-22: 23:00:00 via INTRAVENOUS
  Filled 2011-05-22 (×8): qty 1000

## 2011-05-22 MED ORDER — METOCLOPRAMIDE HCL 5 MG/ML IJ SOLN: 5.0000 mg | Freq: Three times a day (TID) | INTRAMUSCULAR | Status: DC | PRN

## 2011-05-22 MED ORDER — ENOXAPARIN SODIUM 30 MG/0.3ML ~~LOC~~ SOLN
30.0000 mg | Freq: Two times a day (BID) | SUBCUTANEOUS | Status: DC
  Administered 2011-05-23 – 2011-05-24 (×3): 30 mg via SUBCUTANEOUS
  Filled 2011-05-22: qty 0.3
  Filled 2011-05-22: qty 0.4
  Filled 2011-05-22: qty 0.3
  Filled 2011-05-22: qty 0.4

## 2011-05-22 MED ORDER — NEOSTIGMINE METHYLSULFATE 1 MG/ML IJ SOLN
INTRAMUSCULAR | Status: DC | PRN
  Administered 2011-05-22: 3 mg via INTRAVENOUS

## 2011-05-22 SURGICAL SUPPLY — 58 items
ADH SKN CLS APL DERMABOND .7 (GAUZE/BANDAGES/DRESSINGS) ×1
BAG SPEC THK2 15X12 ZIP CLS (MISCELLANEOUS) ×1
BAG ZIPLOCK 12X15 (MISCELLANEOUS) ×2 IMPLANT
BANDAGE ELASTIC 6 VELCRO ST LF (GAUZE/BANDAGES/DRESSINGS) ×2 IMPLANT
BANDAGE ESMARK 6X9 LF (GAUZE/BANDAGES/DRESSINGS) ×1 IMPLANT
BLADE SAW SGTL 13.0X1.19X90.0M (BLADE) ×2 IMPLANT
BNDG CMPR 9X6 STRL LF SNTH (GAUZE/BANDAGES/DRESSINGS) ×1
BNDG ESMARK 6X9 LF (GAUZE/BANDAGES/DRESSINGS) ×2
BOWL SMART MIX CTS (DISPOSABLE) ×2 IMPLANT
CEMENT HV SMART SET (Cement) ×2 IMPLANT
CLOTH BEACON ORANGE TIMEOUT ST (SAFETY) ×2 IMPLANT
CUFF TOURN SGL QUICK 34 (TOURNIQUET CUFF) ×2
CUFF TRNQT CYL 34X4X40X1 (TOURNIQUET CUFF) ×1 IMPLANT
DECANTER SPIKE VIAL GLASS SM (MISCELLANEOUS) ×2 IMPLANT
DERMABOND ADVANCED (GAUZE/BANDAGES/DRESSINGS) ×1
DERMABOND ADVANCED .7 DNX12 (GAUZE/BANDAGES/DRESSINGS) ×1 IMPLANT
DRAPE EXTREMITY T 121X128X90 (DRAPE) ×2 IMPLANT
DRAPE POUCH INSTRU U-SHP 10X18 (DRAPES) ×2 IMPLANT
DRAPE U-SHAPE 47X51 STRL (DRAPES) ×2 IMPLANT
DRSG AQUACEL AG ADV 3.5X10 (GAUZE/BANDAGES/DRESSINGS) ×2 IMPLANT
DRSG TEGADERM 4X4.75 (GAUZE/BANDAGES/DRESSINGS) ×2 IMPLANT
DURAPREP 26ML APPLICATOR (WOUND CARE) ×2 IMPLANT
ELECT REM PT RETURN 9FT ADLT (ELECTROSURGICAL) ×2
ELECTRODE REM PT RTRN 9FT ADLT (ELECTROSURGICAL) ×1 IMPLANT
EVACUATOR 1/8 PVC DRAIN (DRAIN) ×2 IMPLANT
FACESHIELD LNG OPTICON STERILE (SAFETY) ×10 IMPLANT
GAUZE SPONGE 2X2 8PLY STRL LF (GAUZE/BANDAGES/DRESSINGS) ×1 IMPLANT
GLOVE BIOGEL PI IND STRL 7.5 (GLOVE) ×1 IMPLANT
GLOVE BIOGEL PI IND STRL 8 (GLOVE) ×1 IMPLANT
GLOVE BIOGEL PI INDICATOR 7.5 (GLOVE) ×3
GLOVE BIOGEL PI INDICATOR 8 (GLOVE) ×2
GLOVE ECLIPSE 8.0 STRL XLNG CF (GLOVE) ×2 IMPLANT
GLOVE ORTHO TXT STRL SZ7.5 (GLOVE) ×5 IMPLANT
GOWN BRE IMP PREV XXLGXLNG (GOWN DISPOSABLE) ×2 IMPLANT
GOWN STRL NON-REIN LRG LVL3 (GOWN DISPOSABLE) ×2 IMPLANT
HANDPIECE INTERPULSE COAX TIP (DISPOSABLE) ×2
IMMOBILIZER KNEE 20 (SOFTGOODS) ×2
IMMOBILIZER KNEE 20 THIGH 36 (SOFTGOODS) IMPLANT
KIT BASIN OR (CUSTOM PROCEDURE TRAY) ×2 IMPLANT
MANIFOLD NEPTUNE II (INSTRUMENTS) ×2 IMPLANT
NDL SAFETY ECLIPSE 18X1.5 (NEEDLE) ×1 IMPLANT
NEEDLE HYPO 18GX1.5 SHARP (NEEDLE) ×2
NS IRRIG 1000ML POUR BTL (IV SOLUTION) ×4 IMPLANT
PACK TOTAL JOINT (CUSTOM PROCEDURE TRAY) ×2 IMPLANT
POSITIONER SURGICAL ARM (MISCELLANEOUS) ×2 IMPLANT
SET HNDPC FAN SPRY TIP SCT (DISPOSABLE) ×1 IMPLANT
SET PAD KNEE POSITIONER (MISCELLANEOUS) ×2 IMPLANT
SPONGE GAUZE 2X2 STER 10/PKG (GAUZE/BANDAGES/DRESSINGS) ×1
SUCTION FRAZIER 12FR DISP (SUCTIONS) ×2 IMPLANT
SUT MNCRL AB 4-0 PS2 18 (SUTURE) ×2 IMPLANT
SUT VIC AB 1 CT1 36 (SUTURE) ×6 IMPLANT
SUT VIC AB 2-0 CT1 27 (SUTURE) ×6
SUT VIC AB 2-0 CT1 TAPERPNT 27 (SUTURE) ×3 IMPLANT
SYR 50ML LL SCALE MARK (SYRINGE) ×2 IMPLANT
TOWEL OR 17X26 10 PK STRL BLUE (TOWEL DISPOSABLE) ×4 IMPLANT
TRAY FOLEY CATH 14FRSI W/METER (CATHETERS) ×2 IMPLANT
WATER STERILE IRR 1500ML POUR (IV SOLUTION) ×2 IMPLANT
WRAP KNEE MAXI GEL POST OP (GAUZE/BANDAGES/DRESSINGS) ×2 IMPLANT

## 2011-05-22 NOTE — Progress Notes (Addendum)
ANTICOAGULATION CONSULT NOTE - Initial Consult  Pharmacy Consult for Warfarin Indication: VTE prophylaxis  Allergies  Allergen Reactions  . Crestor (Rosuvastatin Calcium) Other (See Comments)    Feeling very bad, aching all over.  . Erythromycin Hives  . Lyrica Other (See Comments)    Crossed vision.    Patient Measurements: Height: 5\' 6"  (167.6 cm) Weight: 195 lb (88.451 kg) IBW/kg (Calculated) : 59.3    Vital Signs: Temp: 97.9 F (36.6 C) (03/18 2100) BP: 126/55 mmHg (03/18 2100) Pulse Rate: 88  (03/18 2100)  Labs:  Basename 05/22/11 1115  HGB --  HCT --  PLT --  APTT --  LABPROT 13.4  INR 1.00  HEPARINUNFRC --  CREATININE --  CKTOTAL --  CKMB --  TROPONINI --   Estimated Creatinine Clearance: 75.4 ml/min (by C-G formula based on Cr of 0.76).  Medical History: Past Medical History  Diagnosis Date  . Thyroid disease     Hypothyroidism  . Hypercholesterolemia   . Hyperlipidemia   . Chronic insomnia   . Glaucoma   . Fibromyalgia   . Arthritis   . Hypothyroidism     s/p graves disease  . Sinus infection     at present- dr Boneta Lucks PA aware- was seen there and at PCP 05/10/11  . Peripheral vascular disease     PULMONARY EMBOLUS x 2- 2011, 2012/ FOLLOWED BY DR ODOGWU-LOV 12/12 EPIC   PT STAES WILL STOP COUMADIN 3/12 and begin LOVONOX 05/17/11 as previously instructed  . Complication of anesthesia     severe itching night of surgery requiring meds- also couldnt swallow- throat "paralyzed""  . Hypertension     PCP Dr Gweneth Dimitri- LOV  05/15/11 with clearance and note on chart,    chest x ray, EKG 12/12 EPIC, eccho 7/11 EPIC    Medications:  Scheduled:    .  ceFAZolin (ANCEF) IV  2 g Intravenous Once  .  ceFAZolin (ANCEF) IV  2 g Intravenous Q6H  . celecoxib  200 mg Oral Daily  . dexamethasone  10 mg Intravenous Once  . diphenhydrAMINE  12.5 mg Intravenous Once  . docusate sodium  100 mg Oral BID  . enoxaparin  40 mg Subcutaneous Q12H  . fenofibrate   160 mg Oral Daily  . ferrous sulfate  325 mg Oral TID PC  . gabapentin  200 mg Oral Daily  . HYDROcodone-acetaminophen  1-2 tablet Oral Q4H  . HYDROmorphone      . irbesartan  300 mg Oral Daily  . ketorolac  7.5 mg Intravenous Q8H  . levothyroxine  150 mcg Oral QAC breakfast  . Magnesium  1 tablet Oral QHS  . Polyethyl Glycol-Propyl Glycol  1-2 drop Ophthalmic BID  . polyethylene glycol  17 g Oral BID  . venlafaxine  75 mg Oral QPC breakfast   Infusions:    . sodium chloride 0.9 % 1,000 mL with potassium chloride 10 mEq infusion    . DISCONTD: lactated ringers 125 mL/hr at 05/22/11 1717  . DISCONTD: lactated ringers     PRN: alum & mag hydroxide-simeth, bisacodyl, diphenhydrAMINE, furosemide, HYDROmorphone, menthol-cetylpyridinium, methocarbamol(ROBAXIN) IV, methocarbamol, metoCLOPramide (REGLAN) injection, metoCLOPramide, ondansetron (ZOFRAN) IV, ondansetron, phenol, sodium phosphate, zolpidem, DISCONTD: 0.9 % irrigation (POUR BTL), DISCONTD: bupivacaine-EPINEPHrine (PF), DISCONTD: HYDROmorphone, DISCONTD: ketorolac, DISCONTD: meperidine (DEMEROL) injection DISCONTD: promethazine  Assessment: 66 yo s/p TKA to start coumadin for VTE prophylaxis/Hx PE and DVT Baseline INR 1.0 Home dose = 5 mg all days except 2.5 mg on Mon/Thurs, last dose  3/13.    Goal of Therapy:  INR 2-3   Plan:  Coumadin 7.5 mg po x 1 tonight Daily PT/INR Agree with lovenox 40 mg SQ Q12h to bridge until INR therapeutic given hx PE/DVT   Che Below, Loma Messing PharmD 9:47 PM 05/22/2011

## 2011-05-22 NOTE — Anesthesia Preprocedure Evaluation (Addendum)
Anesthesia Evaluation  Patient identified by MRN, date of birth, ID band Patient awake    Reviewed: Allergy & Precautions, H&P , NPO status , Patient's Chart, lab work & pertinent test results  Airway Mallampati: III TM Distance: >3 FB Neck ROM: Full    Dental No notable dental hx.    Pulmonary neg pulmonary ROS,  breath sounds clear to auscultation  Pulmonary exam normal       Cardiovascular hypertension, Pt. on medications negative cardio ROS  Rhythm:Regular Rate:Normal     Neuro/Psych  Neuromuscular disease negative neurological ROS  negative psych ROS   GI/Hepatic negative GI ROS, Neg liver ROS,   Endo/Other  negative endocrine ROSHypothyroidism   Renal/GU negative Renal ROS  negative genitourinary   Musculoskeletal negative musculoskeletal ROS (+) Fibromyalgia -  Abdominal   Peds negative pediatric ROS (+)  Hematology negative hematology ROS (+)   Anesthesia Other Findings   Reproductive/Obstetrics negative OB ROS                         Anesthesia Physical Anesthesia Plan  ASA: III  Anesthesia Plan: General   Post-op Pain Management:    Induction: Intravenous  Airway Management Planned: Oral ETT  Additional Equipment:   Intra-op Plan:   Post-operative Plan: Extubation in OR  Informed Consent: I have reviewed the patients History and Physical, chart, labs and discussed the procedure including the risks, benefits and alternatives for the proposed anesthesia with the patient or authorized representative who has indicated his/her understanding and acceptance.   Dental advisory given  Plan Discussed with: CRNA  Anesthesia Plan Comments: (BID lovenox. Not a candidate for SAB. Previous difficult and somewhat traumatic intubation. Will try glidescope today.)        Anesthesia Quick Evaluation

## 2011-05-22 NOTE — Anesthesia Procedure Notes (Signed)
Procedure Name: Intubation Performed by: Jarvis Newcomer A Pre-anesthesia Checklist: Patient identified, Timeout performed, Emergency Drugs available, Suction available and Patient being monitored Patient Re-evaluated:Patient Re-evaluated prior to inductionOxygen Delivery Method: Circle system utilized Preoxygenation: Pre-oxygenation with 100% oxygen Intubation Type: IV induction Ventilation: Mask ventilation without difficulty Laryngoscope Size: Mac and 3 Grade View: Grade II Tube type: Oral Tube size: 7.5 mm Number of attempts: 1 Airway Equipment and Method: Bougie stylet and Video-laryngoscopy Placement Confirmation: ETT inserted through vocal cords under direct vision,  positive ETCO2,  CO2 detector and breath sounds checked- equal and bilateral Secured at: 21 cm Tube secured with: Tape Dental Injury: Teeth and Oropharynx as per pre-operative assessment  Difficulty Due To: Difficulty was anticipated and Difficult Airway- due to reduced neck mobility Future Recommendations: Recommend- induction with short-acting agent, and alternative techniques readily available

## 2011-05-22 NOTE — Interval H&P Note (Signed)
History and Physical Interval Note:  05/22/2011 11:00 AM  [name] [name]  has presented today for surgery, with the diagnosis of Osteoarthritis of the Right Knee  The various methods of treatment have been discussed with the patient and family. After consideration of risks, benefits and other options for treatment, the patient has consented to  Procedure(s) (LRB): RIGHT TOTAL KNEE ARTHROPLASTY (Right) as a surgical intervention .  The patients' history has been reviewed, patient examined, no change in status, stable for surgery.  I have reviewed the patients' chart and labs.  Questions were answered to the patient's satisfaction.     Shelda Pal

## 2011-05-22 NOTE — Op Note (Signed)
NAME:  [name] [name]                      MEDICAL RECORD NO.:  [id number]                             FACILITY:  [facility]      PHYSICIAN:  Madlyn Frankel. Charlann Boxer, M.D.  DATE OF BIRTH:  [birth year]      DATE OF PROCEDURE:  05/22/2011                                     OPERATIVE REPORT         PREOPERATIVE DIAGNOSIS:  Right knee osteoarthritis.      POSTOPERATIVE DIAGNOSIS:  Right knee osteoarthritis.      FINDINGS:  The patient was noted to have complete loss of cartilage and   bone-on-bone arthritis with associated osteophytes in the medial and patellofemoral compartments of   the knee with a significant synovitis and associated effusion.      PROCEDURE:  Right total knee replacement.      COMPONENTS USED:  DePuy rotating platform posterior stabilized knee   system, a size 2.5 femur, 3 tibia, 10 mm insert, and 38 patellar   button.      SURGEON:  Madlyn Frankel. Charlann Boxer, M.D.      ASSISTANT:  Lanney Gins, PA-C.      ANESTHESIA:  General.      SPECIMENS:  None.      COMPLICATION:  None.      DRAINS:  One Hemovac.  EBL: <100cc      TOURNIQUET TIME:   Total Tourniquet Time Documented: Thigh (Right) - 38 minutes .      The patient was stable to the recovery room.      INDICATION FOR PROCEDURE:  [name] [name] is a 66 y.o. female patient of   mine.  The patient had been seen, evaluated, and treated conservatively in the   office with medication, activity modification, and injections.  The patient had   radiographic changes of bone-on-bone arthritis with endplate sclerosis and osteophytes noted.      The patient failed conservative measures including medication, injections, and activity modification, and at this point was ready for more definitive measures.   Based on the radiographic changes and failed conservative measures, the patient   decided to proceed with total knee replacement.  Risks of infection,   DVT, component failure, need for revision surgery, postop course, and   expectations were all   discussed and reviewed.  Consent was obtained for benefit of pain   relief.      PROCEDURE IN DETAIL:  The patient was brought to the operative theater.   Once adequate anesthesia, preoperative antibiotics, 2 gm of Ancef administered, the patient was positioned supine with the right thigh tourniquet placed.  The  right lower extremity was prepped and draped in sterile fashion.  A time-   out was performed identifying the patient, planned procedure, and   extremity.      The right lower extremity was placed in the [facility] leg holder.  The leg was   exsanguinated, tourniquet elevated to 250 mmHg.  A midline incision was   made followed by median parapatellar arthrotomy.  Following initial   exposure, attention was first directed to the patella.  Precut   measurement was  noted to be 21 mm.  I resected down to 13 mm and used a   38 patellar button to restore patellar height as well as cover the cut   surface.      The lug holes were drilled and a metal shim was placed to protect the   patella from retractors and saw blades.      At this point, attention was now directed to the femur.  The femoral   canal was opened with a drill, irrigated to try to prevent fat emboli.  An   intramedullary rod was passed at 3 degrees valgus, 11 mm of bone was   resected off the distal femur due to preoperative flexion contracture.  Following this resection, the tibia was   subluxated anteriorly.  Using the extramedullary guide, 10 mm of bone was resected off   the proximal lateral tibia.  We confirmed the gap would be   stable medially and laterally with a 10 mm insert as well as confirmed   the cut was perpendicular in the coronal plane, checking with an alignment rod.      Once this was done, I sized the femur to be a size 2.5 in the anterior-   posterior dimension, chose a standard component based on medial and   lateral dimension.  The size 2.5 rotation block was then pinned in     position anterior referenced using the C-clamp to set rotation.  The   anterior, posterior, and  chamfer cuts were made without difficulty nor   notching making certain that I was along the anterior cortex to help   with flexion gap stability.      The final box cut was made off the lateral aspect of distal femur.      At this point, the tibia was sized to be a size 3, the size 3 tray was   then pinned in position through the medial third of the tubercle,   drilled, and keel punched.  Trial reduction was now carried with a 2.5 femur,  3 tibia, a 10 mm insert, and the 38 patella botton.  The knee was brought to   extension, full extension with good flexion stability with the patella   tracking through the trochlea without application of pressure.  Given   all these findings, the trial components removed.  Final components were   opened and cement was mixed.  The knee was irrigated with normal saline   solution and pulse lavage.  The synovial lining was   then injected with 0.25% Marcaine with epinephrine and 1 cc of Toradol,   total of 61 cc.      The knee was irrigated.  Final implants were then cemented onto clean and   dried cut surfaces of bone with the knee brought to extension with a 10   mm trial insert.      Once the cement had fully cured, the excess cement was removed   throughout the knee.  I confirmed I was satisfied with the range of   motion and stability, and the final 10 mm insert was chosen.  It was   placed into the knee.      The tourniquet had been let down at 36 minutes.  No significant   hemostasis required.  The medium Hemovac drain was placed deep.  The   extensor mechanism was then reapproximated using #1 Vicryl with the knee   in flexion.  The   remaining wound was  closed with 2-0 Vicryl and running 4-0 Monocryl.   The knee was cleaned, dried, dressed sterilely using Dermabond and   Aquacel dressing.  Drain site dressed separately.  The patient was then    brought to recovery room in stable condition, tolerating the procedure   well.   Please note that Physician Assistant, Lanney Gins, was present for the entirety of the case, and was utilized for pre-operative positioning, peri-operative retractor management, general facilitation of the procedure.  He was also utilized for primary wound closure at the end of the case.              Madlyn Frankel Charlann Boxer, M.D.

## 2011-05-22 NOTE — Anesthesia Postprocedure Evaluation (Signed)
  Anesthesia Post-op Note  Patient: [name] [name]  Procedure(s) Performed: Procedure(s) (LRB): TOTAL KNEE ARTHROPLASTY (Right)  Patient Location: PACU  Anesthesia Type: General  Level of Consciousness: awake and alert   Airway and Oxygen Therapy: Patient Spontanous Breathing  Post-op Pain: mild  Post-op Assessment: Post-op Vital signs reviewed, Patient's Cardiovascular Status Stable, Respiratory Function Stable, Patent Airway and No signs of Nausea or vomiting  Post-op Vital Signs: stable  Complications: No apparent anesthesia complications

## 2011-05-22 NOTE — Preoperative (Signed)
Beta Blockers   Reason not to administer Beta Blockers:Not Applicable 

## 2011-05-22 NOTE — Transfer of Care (Signed)
Immediate Anesthesia Transfer of Care Note  Patient: [name] [name]  Procedure(s) Performed: Procedure(s) (LRB): TOTAL KNEE ARTHROPLASTY (Right)  Patient Location: PACU  Anesthesia Type: General  Level of Consciousness: awake and alert   Airway & Oxygen Therapy: Patient Spontanous Breathing and Patient connected to face mask oxygen  Post-op Assessment: Report given to PACU RN and Post -op Vital signs reviewed and stable  Post vital signs: Reviewed and stable  Complications: No apparent anesthesia complications

## 2011-05-22 NOTE — Progress Notes (Signed)
Pt's lower abdomen is ecchymotic in appearance. Pt has been on Lovenox bridging and last dose was 2230 last night. Abdomen is soft and non tender.

## 2011-05-23 LAB — BASIC METABOLIC PANEL
BUN: 10 mg/dL (ref 6–23)
CO2: 29 mEq/L (ref 19–32)
Calcium: 8.7 mg/dL (ref 8.4–10.5)
Creatinine, Ser: 0.69 mg/dL (ref 0.50–1.10)
GFR calc non Af Amer: 87 mL/min — ABNORMAL LOW (ref >90)
Glucose, Bld: 165 mg/dL — ABNORMAL HIGH (ref 70–99)

## 2011-05-23 LAB — CBC
HCT: 29.4 % — ABNORMAL LOW (ref 36.0–46.0)
Hemoglobin: 9.7 g/dL — ABNORMAL LOW (ref 12.0–15.0)
MCH: 29.5 pg (ref 26.0–34.0)
MCHC: 33 g/dL (ref 30.0–36.0)
MCV: 89.4 fL (ref 78.0–100.0)
RBC: 3.29 MIL/uL — ABNORMAL LOW (ref 3.87–5.11)

## 2011-05-23 LAB — PROTIME-INR: INR: 1.07 (ref 0.00–1.49)

## 2011-05-23 MED ORDER — WARFARIN SODIUM 7.5 MG PO TABS
7.5000 mg | ORAL_TABLET | Freq: Once | ORAL | Status: AC
  Administered 2011-05-23: 7.5 mg via ORAL
  Filled 2011-05-23: qty 1

## 2011-05-23 NOTE — Progress Notes (Signed)
CSW consulted for SNF placement. PN reviewed. PT is recommending HHPT. CSW is available to assist with d/c planning if plan changes and SNF is requested.  Cori Razor LCSW  [phone]

## 2011-05-23 NOTE — Progress Notes (Signed)
Physical Therapy Treatment Patient Details Name: [name] [name] MRN: [id number] DOB: [birth year] Today's Date: 05/23/2011  PT Assessment/Plan  PT - Assessment/Plan PT Plan: Discharge plan remains appropriate PT Frequency: 7X/week Follow Up Recommendations: Home health PT Equipment Recommended: None recommended by PT PT Goals  Acute Rehab PT Goals PT Goal Formulation: With patient Time For Goal Achievement: 7 days Pt will go Supine/Side to Sit: with supervision PT Goal: Supine/Side to Sit - Progress: Goal set today Pt will go Sit to Supine/Side: with supervision PT Goal: Sit to Supine/Side - Progress: Goal set today Pt will go Sit to Stand: with supervision PT Goal: Sit to Stand - Progress: Goal set today Pt will go Stand to Sit: with supervision PT Goal: Stand to Sit - Progress: Goal set today Pt will Ambulate: 51 - 150 feet;with supervision;with rolling walker PT Goal: Ambulate - Progress: Progressing toward goal Pt will Go Up / Down Stairs: 1-2 stairs;with min assist;with least restrictive assistive device PT Goal: Up/Down Stairs - Progress: Goal set today  PT Treatment Precautions/Restrictions  Precautions Precautions: Knee Required Braces or Orthoses: Yes Knee Immobilizer: Discontinue once straight leg raise with < 10 degree lag Restrictions Weight Bearing Restrictions: No Other Position/Activity Restrictions: WBAT Mobility (including Balance) Bed Mobility Sit to Supine: 4: Min assist;5: Supervision Sit to Supine - Details (indicate cue type and reason): min cues for sequence Transfers Sit to Stand: 4: Min assist;5: Supervision Sit to Stand Details (indicate cue type and reason): cues for use of UEs Stand to Sit: 4: Min assist;5: Supervision Stand to Sit Details: cues for LE managment Ambulation/Gait Ambulation/Gait Assistance: 4: Min assist;5: Supervision Ambulation/Gait Assistance Details (indicate cue type and reason): cues for posture and position from  RW Ambulation Distance (Feet): 185 Feet Assistive device: Rolling walker Gait Pattern: Step-to pattern    Exercise    End of Session PT - End of Session Activity Tolerance: Patient tolerated treatment well Patient left: in bed;with call bell in reach Nurse Communication: Mobility status for transfers;Mobility status for ambulation General Behavior During Session: [facility] for tasks performed Cognition: [facility] for tasks performed  Micael Barb 05/23/2011, 4:06 PM

## 2011-05-23 NOTE — Progress Notes (Signed)
Subjective: 1 Day Post-Op Procedure(s) (LRB): TOTAL KNEE ARTHROPLASTY (Right)   Patient reports pain as mild. Appears to be tired this morning, states she didn't sleep well. No events throughout the night.  Objective:   VITALS:   Filed Vitals:   05/23/11 0453  BP: 116/74  Pulse: 73  Temp:   Resp: 14    Neurovascular intact Dorsiflexion/Plantar flexion intact Incision: dressing C/D/I No cellulitis present Compartment soft  LABS  Basename 05/23/11 0457  HGB 9.7*  HCT 29.4*  WBC 9.3  PLT 284     Basename 05/23/11 0457  NA 135  K 3.6  BUN 10  CREATININE 0.69  GLUCOSE 165*     Assessment/Plan: 1 Day Post-Op Procedure(s) (LRB): TOTAL KNEE ARTHROPLASTY (Right)   HV drain d/c'ed Foley cath d/c'ed Advance diet Up with therapy D/C IV fluids Discharge home with home health upon discharge, Wed/Thur   [name] [name]. [name] [name]   PAC  05/23/2011, 8:09 AM

## 2011-05-23 NOTE — Progress Notes (Signed)
ANTICOAGULATION CONSULT NOTE - Follow UP  Pharmacy Consult for Warfarin Indication: Hx DVT, S/P R TKA  Allergies  Allergen Reactions  . Crestor (Rosuvastatin Calcium) Other (See Comments)    Feeling very bad, aching all over.  . Erythromycin Hives  . Lyrica Other (See Comments)    Crossed vision.    Patient Measurements: Height: 5\' 6"  (167.6 cm) Weight: 195 lb (88.451 kg) IBW/kg (Calculated) : 59.3   Vital Signs: Temp: 98.4 F (36.9 C) (03/19 0300) Temp src: Oral (03/19 0300) BP: 116/74 mmHg (03/19 0453) Pulse Rate: 73  (03/19 0453)  Labs:  Basename 05/23/11 0457 05/22/11 1115  HGB 9.7* --  HCT 29.4* --  PLT 284 --  APTT -- --  LABPROT 14.1 13.4  INR 1.07 1.00  HEPARINUNFRC -- --  CREATININE 0.69 --  CKTOTAL -- --  CKMB -- --  TROPONINI -- --   Estimated Creatinine Clearance: 75.4 ml/min (by C-G formula based on Cr of 0.69).  Medical History: Past Medical History  Diagnosis Date  . Thyroid disease     Hypothyroidism  . Hypercholesterolemia   . Hyperlipidemia   . Chronic insomnia   . Glaucoma   . Fibromyalgia   . Arthritis   . Hypothyroidism     s/p graves disease  . Sinus infection     at present- dr Boneta Lucks PA aware- was seen there and at PCP 05/10/11  . Peripheral vascular disease     PULMONARY EMBOLUS x 2- 2011, 2012/ FOLLOWED BY DR [id number]-LOV 12/12 EPIC   PT [facility]AES WILL STOP COUMADIN 3/12 and begin LOVONOX 05/17/11 as previously instructed  . Complication of anesthesia     severe itching night of surgery requiring meds- also couldnt swallow- throat "paralyzed""  . Hypertension     PCP Dr Gweneth Dimitri- LOV  05/15/11 with clearance and note on chart,    chest x ray, EKG 12/12 EPIC, eccho 7/11 EPIC    Medications:  Scheduled:     .  ceFAZolin (ANCEF) IV  2 g Intravenous Once  .  ceFAZolin (ANCEF) IV  2 g Intravenous Q6H  . celecoxib  200 mg Oral Daily  . dexamethasone  10 mg Intravenous Once  . diphenhydrAMINE  12.5 mg Intravenous Once  . docusate  sodium  100 mg Oral BID  . enoxaparin  30 mg Subcutaneous Q12H  . fenofibrate  160 mg Oral Daily  . ferrous sulfate  325 mg Oral TID PC  . gabapentin  200 mg Oral Daily  . HYDROcodone-acetaminophen  1-2 tablet Oral Q4H  . HYDROmorphone      . irbesartan  300 mg Oral Daily  . ketorolac  7.5 mg Intravenous Q8H  . levothyroxine  150 mcg Oral QAC breakfast  . magnesium oxide  200 mg Oral QHS  . polyethylene glycol  17 g Oral BID  . polyvinyl alcohol  1-2 drop Both Eyes BID  . venlafaxine  75 mg Oral QPC breakfast  . warfarin  7.5 mg Oral ONCE-1800  . Warfarin - Pharmacist Dosing Inpatient   Does not apply q1800  . DISCONTD: gabapentin  200 mg Oral Daily  . DISCONTD: Magnesium  1 tablet Oral QHS  . DISCONTD: Polyethyl Glycol-Propyl Glycol  1-2 drop Ophthalmic BID   Infusions:     . sodium chloride 0.9 % 1,000 mL with potassium chloride 10 mEq infusion 100 mL/hr at 05/22/11 2251  . DISCONTD: lactated ringers 125 mL/hr at 05/22/11 1717  . DISCONTD: lactated ringers  PRN: alum & mag hydroxide-simeth, bisacodyl, diphenhydrAMINE, furosemide, HYDROmorphone, menthol-cetylpyridinium, methocarbamol(ROBAXIN) IV, methocarbamol, metoCLOPramide (REGLAN) injection, metoCLOPramide, ondansetron (ZOFRAN) IV, ondansetron, phenol, sodium phosphate, zolpidem, DISCONTD: 0.9 % irrigation (POUR BTL), DISCONTD: bupivacaine-EPINEPHrine (PF), DISCONTD: HYDROmorphone, DISCONTD: ketorolac, DISCONTD: meperidine (DEMEROL) injection DISCONTD: promethazine  Assessment:  66 yo s/p TKA on coumadin PTA for hx DVT. Was taken off coumadin prior to surgery and was on Lovenox 90mg  sq q12h x 10 doses prior to surgery  Home Coumadin dose = 5 mg all days except 2.5 mg on Mon/Thurs, last dose 3/13.   Lovenox 30mg  sq q12h started this am  Coumadin 7.5mg  dose ordered last night but entered for 3/19 instead of 3/18 @2300   Goal of Therapy:  INR 2-3  Plan:  Coumadin 7.5 mg po x 1 now  Daily PT/INR  Continue lovenox  30 mg SQ Q12h to bridge until INR therapeutic given hx PE/DVT   Gwen Her PharmD  [phone] 05/23/2011 11:21 AM

## 2011-05-23 NOTE — Evaluation (Signed)
Physical Therapy Evaluation Patient Details Name: [name] [name] MRN: [id number] DOB: 05/25/1945 Today's Date: 05/23/2011  Problem List:  Patient Active Problem List  Diagnoses  . S/P right TKA    Past Medical History:  Past Medical History  Diagnosis Date  . Thyroid disease     Hypothyroidism  . Hypercholesterolemia   . Hyperlipidemia   . Chronic insomnia   . Glaucoma   . Fibromyalgia   . Arthritis   . Hypothyroidism     s/p graves disease  . Sinus infection     at present- dr Boneta Lucks PA aware- was seen there and at PCP 05/10/11  . Peripheral vascular disease     PULMONARY EMBOLUS x 2- 2011, 2012/ FOLLOWED BY DR ODOGWU-LOV 12/12 EPIC   PT STAES WILL STOP COUMADIN 3/12 and begin LOVONOX 05/17/11 as previously instructed  . Complication of anesthesia     severe itching night of surgery requiring meds- also couldnt swallow- throat "paralyzed""  . Hypertension     PCP Dr Gweneth Dimitri- LOV  05/15/11 with clearance and note on chart,    chest x ray, EKG 12/12 EPIC, eccho 7/11 EPIC   Past Surgical History:  Past Surgical History  Procedure Date  . Abdominal hysterectomy   . Exploratory laparotomy   . Joint replacement     left knee  7/12  . Colonoscopy   . Eye surgery     bilateral cataract extraction with IOL,    BILATERAL LASER for glaucoma treatment  . Appendectomy   . Cholecystectomy   . Uterine suspension     PT Assessment/Plan/Recommendation PT Assessment Clinical Impression Statement: Pt with R TKR presents with decreased R LE strength/ROM and limited functional mobility. PT Recommendation/Assessment: Patient will need skilled PT in the acute care venue PT Problem List: Decreased strength;Decreased range of motion;Decreased activity tolerance;Decreased mobility;Decreased knowledge of use of DME;Pain PT Therapy Diagnosis : Difficulty walking PT Plan PT Frequency: 7X/week PT Treatment/Interventions: DME instruction;Gait training;Stair training;Functional mobility  training;Therapeutic activities;Therapeutic exercise;Patient/family education PT Recommendation Recommendations for Other Services: OT consult Follow Up Recommendations: Home health PT Equipment Recommended: None recommended by PT PT Goals  Acute Rehab PT Goals PT Goal Formulation: With patient Time For Goal Achievement: 7 days Pt will go Supine/Side to Sit: with supervision PT Goal: Supine/Side to Sit - Progress: Goal set today Pt will go Sit to Supine/Side: with supervision PT Goal: Sit to Supine/Side - Progress: Goal set today Pt will go Sit to Stand: with supervision PT Goal: Sit to Stand - Progress: Goal set today Pt will go Stand to Sit: with supervision PT Goal: Stand to Sit - Progress: Goal set today Pt will Ambulate: 51 - 150 feet;with supervision;with rolling walker PT Goal: Ambulate - Progress: Goal set today Pt will Go Up / Down Stairs: 1-2 stairs;with min assist;with least restrictive assistive device PT Goal: Up/Down Stairs - Progress: Goal set today  PT Evaluation Precautions/Restrictions  Precautions Precautions: Knee Required Braces or Orthoses: Yes Knee Immobilizer: Discontinue once straight leg raise with < 10 degree lag Restrictions Weight Bearing Restrictions: No Other Position/Activity Restrictions: WBAT Prior Functioning  Home Living Lives With: Alone Receives Help From: Friend(s) Type of Home: House Home Layout: Two level;1/2 bath on main level;Able to live on main level with bedroom/bathroom Alternate Level Stairs-Rails: Left Alternate Level Stairs-Number of Steps: 13 Home Access: Stairs to enter Entrance Stairs-Rails: None Entrance Stairs-Number of Steps: 2 Home Adaptive Equipment: Walker - rolling Prior Function Level of Independence: Independent with basic ADLs;Independent with  gait;Independent with transfers;Requires assistive device for independence Able to Take Stairs?: Yes Cognition Cognition Arousal/Alertness: Awake/alert Overall  Cognitive Status: Appears within functional limits for tasks assessed Orientation Level: Oriented X4 Sensation/Coordination Coordination Gross Motor Movements are Fluid and Coordinated: Yes Extremity Assessment RUE Assessment RUE Assessment: Within Functional Limits LUE Assessment LUE Assessment: Within Functional Limits RLE Assessment RLE Assessment: Exceptions to [facility] (3-/5 quads; knee AAROM -10 - 85) LLE Assessment LLE Assessment: Within Functional Limits Mobility (including Balance) Bed Mobility Bed Mobility: Yes Supine to Sit: 4: Min assist Supine to Sit Details (indicate cue type and reason): min cues for sequence; min assist with R LE Transfers Transfers: Yes Sit to Stand: 4: Min assist Sit to Stand Details (indicate cue type and reason): cues for use of UEs and for LE position Stand to Sit: 4: Min assist Stand to Sit Details: cues for use of UEs and for LE position Ambulation/Gait Ambulation/Gait: Yes Ambulation/Gait Assistance: 4: Min assist Ambulation/Gait Assistance Details (indicate cue type and reason): cues for posture, sequence, and position from RW Ambulation Distance (Feet): 58 Feet Assistive device: Rolling walker Gait Pattern: Step-to pattern    Exercise  Total Joint Exercises Ankle Circles/Pumps: AROM;10 reps;Both;Supine Quad Sets: AROM;Both;10 reps;Supine Heel Slides: AAROM;10 reps;Supine;Right Straight Leg Raises: AAROM;10 reps;Right;Supine End of Session PT - End of Session Equipment Utilized During Treatment: Right knee immobilizer Activity Tolerance: Patient tolerated treatment well Patient left: in chair;with call bell in reach Nurse Communication: Mobility status for transfers;Mobility status for ambulation General Behavior During Session: [facility] for tasks performed Cognition: [facility] for tasks performed  Daymond Cordts 05/23/2011, 1:14 PM

## 2011-05-23 NOTE — Progress Notes (Signed)
CARE MANAGEMENT NOTE 05/23/2011  Patient:  [name],[name]   Account Number:  [id number]  Date Initiated:  05/23/2011  Documentation initiated by:  Colleen Can  Subjective/Objective Assessment:   dx osteoaqrthritis right knee; total knee replacemnt     Action/Plan:   CM spoke with patient. Patient plans to return to her home where her friend will be caregiver. [facility] is requesting [facility] services.  States she already has crutches, RW, commode seat.   Anticipated DC Date:  05/25/2011   Anticipated DC Plan:  HOME W HOME HEALTH SERVICES  In-house referral  Clinical Social Worker      DC Planning Services  CM consult      [facility] Choice  HOME HEALTH   Choice offered to / List presented to:  C-1 Patient   DME arranged  NA      DME agency  NA     HH arranged  HH-2 PT  HH-1 RN      [facility] agency  [facility]   Status of service:  In process, will continue to follow Medicare Important Message given?   (If response is "NO", the following Medicare IM given date fields will be blank) Date Medicare IM given:   Date Additional Medicare IM given:    Discharge Disposition:    Per UR Regulation:    If discussed at Long Length of Stay Meetings, dates discussed:    Comments:  05/23/2011 Colleen Can, Rn BSN CCM [phone] Pt states 2nd choice is [facility] services. [facility] was called and can not service patient. [facility] Rep states they can service patient and had received referral from MD office. Face sheet, HH orders, op note, H&P faxed to [facility] intake with confirmation-[phone]. Advised rep that pt will require coumadin managemnt with blood drws and hhpt. List of HH agencies placed in shadow chart . Cm will follow

## 2011-05-24 LAB — BASIC METABOLIC PANEL
BUN: 11 mg/dL (ref 6–23)
CO2: 30 mEq/L (ref 19–32)
Chloride: 107 mEq/L (ref 96–112)
Creatinine, Ser: 0.67 mg/dL (ref 0.50–1.10)
Glucose, Bld: 142 mg/dL — ABNORMAL HIGH (ref 70–99)

## 2011-05-24 LAB — CBC
HCT: 26.7 % — ABNORMAL LOW (ref 36.0–46.0)
Hemoglobin: 8.9 g/dL — ABNORMAL LOW (ref 12.0–15.0)
MCV: 88.7 fL (ref 78.0–100.0)
RBC: 3.01 MIL/uL — ABNORMAL LOW (ref 3.87–5.11)
WBC: 11.5 10*3/uL — ABNORMAL HIGH (ref 4.0–10.5)

## 2011-05-24 LAB — PROTIME-INR: Prothrombin Time: 15.5 seconds — ABNORMAL HIGH (ref 11.6–15.2)

## 2011-05-24 MED ORDER — FERROUS SULFATE 325 (65 FE) MG PO TABS: 325.0000 mg | ORAL_TABLET | Freq: Three times a day (TID) | ORAL | Status: DC

## 2011-05-24 MED ORDER — WARFARIN SODIUM 7.5 MG PO TABS: 7.5000 mg | ORAL_TABLET | Freq: Once | ORAL | Status: DC

## 2011-05-24 MED ORDER — POLYETHYLENE GLYCOL 3350 17 G PO PACK: 17.0000 g | PACK | Freq: Two times a day (BID) | ORAL | Status: AC

## 2011-05-24 MED ORDER — DIPHENHYDRAMINE HCL 25 MG PO CAPS: 25.0000 mg | ORAL_CAPSULE | Freq: Four times a day (QID) | ORAL | Status: AC | PRN

## 2011-05-24 MED ORDER — METHOCARBAMOL 500 MG PO TABS: 500.0000 mg | ORAL_TABLET | Freq: Four times a day (QID) | ORAL | Status: AC | PRN

## 2011-05-24 MED ORDER — PANTOPRAZOLE SODIUM 40 MG PO TBEC
40.0000 mg | DELAYED_RELEASE_TABLET | Freq: Every day | ORAL | Status: DC
  Administered 2011-05-24: 40 mg via ORAL
  Filled 2011-05-24: qty 1

## 2011-05-24 MED ORDER — DSS 100 MG PO CAPS: 100.0000 mg | ORAL_CAPSULE | Freq: Two times a day (BID) | ORAL | Status: AC

## 2011-05-24 MED ORDER — HYDROCODONE-ACETAMINOPHEN 7.5-325 MG PO TABS: 1.0000 | ORAL_TABLET | ORAL | Status: AC

## 2011-05-24 MED ORDER — ENOXAPARIN SODIUM 30 MG/0.3ML ~~LOC~~ SOLN: 30.0000 mg | Freq: Two times a day (BID) | SUBCUTANEOUS | Status: DC

## 2011-05-24 MED ORDER — WARFARIN SODIUM 7.5 MG PO TABS
7.5000 mg | ORAL_TABLET | Freq: Once | ORAL | Status: DC
  Filled 2011-05-24: qty 1

## 2011-05-24 NOTE — Progress Notes (Signed)
Physical Therapy Treatment Patient Details Name: [name] [name] MRN: [id number] DOB: 1945-08-05 Today's Date: 05/24/2011  R THR POD #2 10:35 - 11:00 1 gt  1 ta   PT Assessment/Plan  PT - Assessment/Plan Comments on Treatment Session: Pt has had a L TKR summer 2012 and is very knowledgable.  Practiced up/down 2 steps twice, VC's needed on which leg was "good" now that she had this TKR.  Pt has all equioment from prior.  Pt requested to take a shower before she leaves.  Pt plans to D/C to home today. PT Plan: Discharge plan remains appropriate Follow Up Recommendations: Home health PT Equipment Recommended: None recommended by PT PT Goals  Acute Rehab PT Goals PT Goal Formulation: With patient Pt will go Supine/Side to Sit: with supervision PT Goal: Supine/Side to Sit - Progress: Met Pt will go Sit to Supine/Side: with supervision PT Goal: Sit to Supine/Side - Progress: Met Pt will go Sit to Stand: with supervision PT Goal: Sit to Stand - Progress: Met Pt will go Stand to Sit: with supervision PT Goal: Stand to Sit - Progress: Met Pt will Ambulate: 51 - 150 feet;with supervision;with rolling walker PT Goal: Ambulate - Progress: Met Pt will Go Up / Down Stairs: 1-2 stairs;with min assist;with least restrictive assistive device PT Goal: Up/Down Stairs - Progress: Met  PT Treatment Precautions/Restrictions  Precautions Precautions: Knee Required Braces or Orthoses: Yes Knee Immobilizer: Discontinue once straight leg raise with < 10 degree lag Restrictions Weight Bearing Restrictions: No Other Position/Activity Restrictions: WBAT Mobility (including Balance) Bed Mobility Bed Mobility: Yes (Assisted pt OOB) Supine to Sit: 5: Supervision Supine to Sit Details (indicate cue type and reason): increased time Transfers Transfers: Yes Sit to Stand: 5: Supervision;From bed Sit to Stand Details (indicate cue type and reason): One VC on safety with hand placement as pt is  impulisive Stand to Sit: 5: Supervision;To bed Stand to Sit Details: One Vc on turn completion using RW prior to sit Ambulation/Gait Ambulation/Gait: Yes Ambulation/Gait Assistance: 5: Supervision Ambulation/Gait Assistance Details (indicate cue type and reason): No KI as pt was able to perform 10 active SLR.  <25% VC's on safety with turns and backward gait. Ambulation Distance (Feet): 175 Feet Assistive device: Rolling walker Gait Pattern: Step-through pattern Gait velocity: Pt only c/o "soreness" with act, ICE applied and rest. Stairs: Yes Stairs Assistance: Other (comment) (MinGuard Assist) Stair Management Technique: Two rails;Other (comment) (Pt states she has a post on each side she uses.) Number of Stairs: 2  (performed twice) Wheelchair Mobility Wheelchair Mobility: No    Exercise   Pt given handout on TKR HEP  End of Session PT - End of Session Equipment Utilized During Treatment: Gait belt Activity Tolerance: Patient tolerated treatment well Patient left: in bed;with call bell in reach;Other (comment) (ICE to knee) Nurse Communication: Mobility status for transfers General Behavior During Session: [facility] for tasks performed Cognition: [facility] for tasks performed  Felecia Shelling  PTA [facility]  Acute  Rehab Pager     [phone]

## 2011-05-24 NOTE — Progress Notes (Signed)
CARE MANAGEMENT NOTE 05/24/2011  Patient:  [name] [name],[name] [name]   Account Number:  [id number]  Date Initiated:  05/23/2011  Documentation initiated by:  Colleen Can  Subjective/Objective Assessment:   dx osteoaqrthritis right knee; total knee replacemnt     Action/Plan:   CM spoke with patient. Patient plans to return to her home where her friend will be caregiver. [facility].  States she already has crutches, RW, commode seat.   Anticipated DC Date:  05/25/2011   Anticipated DC Plan:  HOME W HOME HEALTH SERVICES  In-house referral  Clinical Social Worker      DC Planning Services  CM consult      [facility] Choice  HOME HEALTH   Choice offered to / List presented to:  C-1 Patient   DME arranged  NA      DME agency  NA     HH arranged  HH-2 PT  HH-1 RN      [facility] agency  [facility] Home Care   Status of service:  Completed, signed off Medicare Important Message given?  NO (If response is "NO", the following Medicare IM given date fields will be blank) Date Medicare IM given:   Date Additional Medicare IM given:    Discharge Disposition:  HOME W HOME HEALTH SERVICES  Comments:  05/24/2011 Colleen Can, RN BSN CCM [phone] Pt for discharge today. [facility] in place for [facility], pt; services will start tomorrow 05/25/2011.

## 2011-05-24 NOTE — Progress Notes (Signed)
ANTICOAGULATION CONSULT NOTE - Follow UP  Pharmacy Consult for Warfarin Indication: Hx DVT, S/P R TKA  Allergies  Allergen Reactions  . Crestor (Rosuvastatin Calcium) Other (See Comments)    Feeling very bad, aching all over.  . Erythromycin Hives  . Lyrica Other (See Comments)    Crossed vision.    Patient Measurements: Height: 5\' 6"  (167.6 cm) Weight: 195 lb (88.451 kg) IBW/kg (Calculated) : 59.3   Vital Signs: Temp: 98 F (36.7 C) (03/20 0520) Temp src: Oral (03/20 0520) BP: 124/66 mmHg (03/20 0520) Pulse Rate: 74  (03/20 0520)  Labs:  Basename 05/24/11 0508 05/23/11 0457 05/22/11 1115  HGB 8.9* 9.7* --  HCT 26.7* 29.4* --  PLT 275 284 --  APTT -- -- --  LABPROT 15.5* 14.1 13.4  INR 1.20 1.07 1.00  HEPARINUNFRC -- -- --  CREATININE 0.67 0.69 --  CKTOTAL -- -- --  CKMB -- -- --  TROPONINI -- -- --   Estimated Creatinine Clearance: 75.4 ml/min (by C-G formula based on Cr of 0.67).  Medical History: Past Medical History  Diagnosis Date  . Thyroid disease     Hypothyroidism  . Hypercholesterolemia   . Hyperlipidemia   . Chronic insomnia   . Glaucoma   . Fibromyalgia   . Arthritis   . Hypothyroidism     s/p graves disease  . Sinus infection     at present- dr Boneta Lucks PA aware- was seen there and at PCP 05/10/11  . Peripheral vascular disease     PULMONARY EMBOLUS x 2- 2011, 2012/ FOLLOWED BY DR ODOGWU-LOV 12/12 EPIC   PT STAES WILL STOP COUMADIN 3/12 and begin LOVONOX 05/17/11 as previously instructed  . Complication of anesthesia     severe itching night of surgery requiring meds- also couldnt swallow- throat "paralyzed""  . Hypertension     PCP Dr Gweneth Dimitri- LOV  05/15/11 with clearance and note on chart,    chest x ray, EKG 12/12 EPIC, eccho 7/11 EPIC    Medications:  Scheduled:     .  ceFAZolin (ANCEF) IV  2 g Intravenous Q6H  . celecoxib  200 mg Oral Daily  . dexamethasone  10 mg Intravenous Once  . docusate sodium  100 mg Oral BID  .  enoxaparin  30 mg Subcutaneous Q12H  . fenofibrate  160 mg Oral Daily  . ferrous sulfate  325 mg Oral TID PC  . gabapentin  200 mg Oral Daily  . HYDROcodone-acetaminophen  1-2 tablet Oral Q4H  . HYDROmorphone      . irbesartan  300 mg Oral Daily  . ketorolac  7.5 mg Intravenous Q8H  . levothyroxine  150 mcg Oral QAC breakfast  . magnesium oxide  200 mg Oral QHS  . polyethylene glycol  17 g Oral BID  . polyvinyl alcohol  1-2 drop Both Eyes BID  . venlafaxine  75 mg Oral QPC breakfast  . warfarin  7.5 mg Oral Once  . Warfarin - Pharmacist Dosing Inpatient   Does not apply q1800  . DISCONTD: warfarin  7.5 mg Oral ONCE-1800   Infusions:     . sodium chloride 0.9 % 1,000 mL with potassium chloride 10 mEq infusion 100 mL/hr at 05/22/11 2251   PRN: alum & mag hydroxide-simeth, bisacodyl, diphenhydrAMINE, furosemide, HYDROmorphone, menthol-cetylpyridinium, methocarbamol(ROBAXIN) IV, methocarbamol, metoCLOPramide (REGLAN) injection, metoCLOPramide, ondansetron (ZOFRAN) IV, ondansetron, phenol, zolpidem  Assessment:  66 yo s/p TKA on coumadin PTA for hx DVT. Was taken off coumadin prior to surgery  and was on Lovenox 90mg  sq q12h x 10 doses prior to surgery  Home Coumadin dose = 5 mg all days except 2.5 mg on Mon/Thurs, last dose 3/13.   Lovenox 30mg  sq q12h started 3/19 AM  Coumadin 7.5mg  yesterday  INR up slightly, subtherapeutic  Goal of Therapy:  INR 2-3  Plan:  Coumadin 7.5 mg po today  Daily PT/INR  Continue lovenox 30 mg SQ Q12h to bridge until INR therapeutic given hx PE/DVT   Gwen Her PharmD  [phone] 05/24/2011 7:22 AM

## 2011-05-24 NOTE — Progress Notes (Signed)
Occupational Therapy Note Spoke with patient who had opposite knee done in July last year and she doesn't feel she needs OT. She has all DME. Will sign off per pt request. Judithann Sauger OTR/L [phone] 05/24/2011

## 2011-05-24 NOTE — Progress Notes (Signed)
Physical Therapy Treatment Patient Details Name: [name] [name] MRN: [id number] DOB: 1945/11/27 Today's Date: 05/24/2011  R TKR POD #2 14;10 - 14:35 2 te PT Assessment/Plan  PT - Assessment/Plan Comments on Treatment Session: Pt given handout on TKR HEP and performed all TE's with instructions.  Applied ICE.  Pt plans to D/C to home today. PT Plan: Discharge plan remains appropriate Follow Up Recommendations: Home health PT Equipment Recommended: None recommended by PT PT Goals  Acute Rehab PT Goals PT Goal Formulation: With patient Pt will go Supine/Side to Sit: with supervision PT Goal: Supine/Side to Sit - Progress: Met Pt will go Sit to Supine/Side: with supervision PT Goal: Sit to Supine/Side - Progress: Met Pt will go Sit to Stand: with supervision PT Goal: Sit to Stand - Progress: Met Pt will go Stand to Sit: with supervision PT Goal: Stand to Sit - Progress: Met Pt will Ambulate: 51 - 150 feet;with supervision;with rolling walker PT Goal: Ambulate - Progress: Met Pt will Go Up / Down Stairs: 1-2 stairs;with min assist;with least restrictive assistive device PT Goal: Up/Down Stairs - Progress: Met Pt will perform TKR HEP at Supervision level - Progress met  PT Treatment Precautions/Restrictions  Precautions Precautions: Knee Required Braces or Orthoses: Yes Knee Immobilizer: Discontinue once straight leg raise with < 10 degree lag Restrictions Weight Bearing Restrictions: No Other Position/Activity Restrictions: WBAT Mobility (including Balance) Bed Mobility Bed Mobility: Yes Supine to Sit: 6: Modified independent (Device/Increase time) Supine to Sit Details (indicate cue type and reason): increased time Sit to Supine: 6: Modified independent (Device/Increase time) Transfers Transfers: Yes Sit to Stand: 6: Modified independent (Device/Increase time) Sit to Stand Details (indicate cue type and reason): good use of hands and safety Stand to Sit: 6: Modified  independent (Device/Increase time) Stand to Sit Details: good use of hands and safety Ambulation/Gait Ambulation/Gait: No (TX session focused on TKR TE's) Ambulation/Gait Assistance: 5: Supervision Ambulation/Gait Assistance Details (indicate cue type and reason): No KI as pt was able to perform 10 active SLR.  <25% VC's on safety with turns and backward gait. Ambulation Distance (Feet): 175 Feet Assistive device: Rolling walker Gait Pattern: Step-through pattern Gait velocity: Pt only c/o "soreness" with act, ICE applied and rest. Stairs: Yes Stairs Assistance: Other (comment) (MinGuard Assist) Stair Management Technique: Two rails;Other (comment) (Pt states she has a post on each side she uses.) Number of Stairs: 2  (performed twice) Wheelchair Mobility Wheelchair Mobility: No    Exercise  Total Joint Exercises Ankle Circles/Pumps: AROM;Both;10 reps;Supine Quad Sets: AROM;Both;10 reps;Supine Gluteal Sets: AROM;Both;10 reps;Supine Towel Squeeze: AROM;Both;10 reps;Supine Short Arc Quad: AROM;Right;10 reps;Supine Heel Slides: AAROM;Right;10 reps;Supine Hip ABduction/ADduction: AROM;Right;10 reps;Supine Straight Leg Raises: AROM;Right;10 reps;Supine Long Arc Quad: AROM;Right;10 reps;Seated Knee Flexion: AROM;Right;10 reps;Seated End of Session PT - End of Session Equipment Utilized During Treatment: Gait belt Activity Tolerance: Patient tolerated treatment well Patient left: in bed;with call bell in reach Nurse Communication: Other (comment) (Pt ready for D/C to home) General Behavior During Session: [facility] for tasks performed Cognition: [facility] for tasks performed  Felecia Shelling  PTA [facility]  Acute  Rehab Pager     [phone]

## 2011-05-24 NOTE — Progress Notes (Signed)
Subjective: 2 Days Post-Op Procedure(s) (LRB): TOTAL KNEE ARTHROPLASTY (Right)   Patient reports pain as mild. Pain well controlled with medication. Little indigestion from laying in bed, otherwise no events. Ready to be discharged home.  Objective:   VITALS:   Filed Vitals:   05/24/11 0520  BP: 124/66  Pulse: 74  Temp: 98 F (36.7 C)  Resp: 16    Neurovascular intact Dorsiflexion/Plantar flexion intact Incision: dressing C/D/I No cellulitis present Compartment soft  LABS  Basename 05/24/11 0508 05/23/11 0457  HGB 8.9* 9.7*  HCT 26.7* 29.4*  WBC 11.5* 9.3  PLT 275 284     Basename 05/24/11 0508 05/23/11 0457  NA 140 135  K 4.0 3.6  BUN 11 10  CREATININE 0.67 0.69  GLUCOSE 142* 165*     Assessment/Plan: 2 Days Post-Op Procedure(s) (LRB): TOTAL KNEE ARTHROPLASTY (Right)   Up with therapy Discharge home with home health Follow up in 2 weeks at [facility].  Follow-up Information    Follow up with OLIN,Miriam Kestler D in 2 weeks.   Contact information:   [facility] [address] [place] [address] [phone]          Anastasio Auerbach. Sabrinna Yearwood   PAC  05/24/2011, 9:11 AM  \

## 2011-05-25 NOTE — Discharge Summary (Signed)
Physician Discharge Summary  Patient ID: [name] [name] MRN: [id number] DOB/AGE: [birth year] 66 y.o.  Admit date: 05/22/2011 Discharge date: 05/24/2011  Procedures:  Procedure(s) (LRB): TOTAL KNEE ARTHROPLASTY (Right)  Attending Physician:  Dr. Durene Romans   Admission Diagnoses: End-stage osteoarthritis of the right knee.  Discharge Diagnoses:  Principal Problem:  *S/P right TKA HTN Hyperlipidemia,  Hypothyroidism Depression History of DVT with PE.   HPI: This is a 66 year old lady with a history of end-stage osteoarthritis of her right knee that has failed conservative treatment to alleviate her pain. She has had a previous total knee arthroplasty on the left knee and done quite well from that and now is scheduled for total knee arthroplasty of the right knee. The surgery risks, benefits,and aftercare were discussed with the patient. Questions invited and answered. She does have a history of a DVT with PE and is on chronic Coumadin therapy. She will go on bridging Lovenox 5 days before surgery and then bridging Lovenox postoperatively until her Coumadin is back up to therapeutic levels. She understands the increased risk of DVT/PE and possibly even death due to her history. Otherwise, surgery will go ahead as scheduled. Note that she is not a candidate for tranexamic acid or dexamethasone and will not receive that at preop. She does plan on going home after surgery. She is not given her home medications today.  PCP: Gweneth Dimitri, MD, MD   Discharged Condition: good  Hospital Course:  Patient underwent the above stated procedure on 05/22/2011. Patient tolerated the procedure well and brought to the recovery room in good condition and subsequently to the floor.  POD #1 BP: 126/60 ; Pulse: 70 ; Temp: 98.4 F (36.9 C) ; Resp: 18  Pt's foley was removed, as well as the hemovac drain removed. IV was changed to a saline lock. Patient reports pain as mild. Appears to be tired this morning,  states she didn't sleep well. No events throughout the night. Neurovascular intact, dorsiflexion/plantar flexion intact, incision: dressing C/D/I, no cellulitis present and compartment soft.   LABS  Basename  05/23/11 0457  HGB  9.7  HCT  29.4    POD #2  BP: 124/66 ; Pulse: 74 ; Temp: 98 F (36.7 C) ; Resp: 16  Patient reports pain as mild. Pain well controlled with medication. Little indigestion from laying in bed, otherwise no events. Ready to be discharged home. Neurovascular intact, dorsiflexion/plantar flexion intact, incision: dressing C/D/I, no cellulitis present and compartment soft.   LABS  Basename  05/24/11 0508  HGB  8.9  HCT  26.7    Discharge Exam: General appearance: alert, cooperative and no distress Extremities: Homans sign is negative, no sign of DVT, no edema, redness or tenderness in the calves or thighs and no ulcers, gangrene or trophic changes  Disposition: Home with follow up in 2 weeks  Follow-up Information    Follow up with OLIN,Yuchen Fedor D in 2 weeks.   Contact information:   [facility] [address], [address] [place] [address] [phone]          Discharge Orders    Future Appointments: Provider: Department: Dept Phone: Center:   02/07/2012 8:30 AM Krista Blue [facility] [phone] None   02/07/2012 9:00 AM Laurice Record, MD [facility] [phone] None     Future Orders Please Complete By Expires   Diet - low sodium heart healthy      Call MD / Call 911      Comments:  If you experience chest pain or shortness of breath, CALL 911 and be transported to the hospital emergency room.  If you develope a fever above 101 F, pus (white drainage) or increased drainage or redness at the wound, or calf pain, call your surgeon's office.   Discharge instructions      Comments:   Maintain surgical dressing for 8 days, then replace with gauze and tape. Keep the area dry and clean until follow up.  Follow up in 2 weeks at [facility]. Call with any questions or concerns.     Constipation Prevention      Comments:   Drink plenty of fluids.  Prune juice may be helpful.  You may use a stool softener, such as Colace (over the counter) 100 mg twice a day.  Use MiraLax (over the counter) for constipation as needed.   Increase activity slowly as tolerated      Weight Bearing as taught in Physical Therapy      Comments:   Use a walker or crutches as instructed.   Driving restrictions      Comments:   No driving for 4 weeks   TED hose      Comments:   Use stockings (TED hose) for 2 weeks on both leg(s).  You may remove them at night for sleeping.   Change dressing      Comments:   Maintain surgical dressing for 8 days, then change the dressing daily with sterile 4 x 4 inch gauze dressing and tape. Keep the area dry and clean.      Discharge Medication List as of 05/24/2011  1:31 PM    START taking these medications   Details  diphenhydrAMINE (BENADRYL) 25 mg capsule Take 1 capsule (25 mg total) by mouth every 6 (six) hours as needed for itching, allergies or sleep., Starting 05/24/2011, Until Sat 06/03/11, No Print    docusate sodium 100 MG CAPS Take 100 mg by mouth 2 (two) times daily., Starting 05/24/2011, Until Sat 06/03/11, No Print    HYDROcodone-acetaminophen (NORCO) 7.5-325 MG per tablet Take 1-2 tablets by mouth every 4 (four) hours., Starting 05/24/2011, Until Sat 06/03/11, Print    methocarbamol (ROBAXIN) 500 MG tablet Take 1 tablet (500 mg total) by mouth every 6 (six) hours as needed (muscle spasms)., Starting 05/24/2011, Until Sat 06/03/11, Print    polyethylene glycol (MIRALAX / GLYCOLAX) packet Take 17 g by mouth 2 (two) times daily., Starting 05/24/2011, Until Sat 05/27/11, No Print      CONTINUE these medications which have CHANGED   Details  enoxaparin (LOVENOX) 30 MG/0.3ML injection Inject 0.3 mLs (30 mg total) into the skin every 12 (twelve) hours., Starting  05/24/2011, Until Discontinued, Print    ferrous sulfate 325 (65 FE) MG tablet Take 1 tablet (325 mg total) by mouth 3 (three) times daily after meals., Starting 05/24/2011, Until Thu 05/23/12, No Print    warfarin (COUMADIN) 7.5 MG tablet Take 1 tablet (7.5 mg total) by mouth one time only at 6 PM., Starting 05/24/2011, Until Thu 05/23/12, Print      CONTINUE these medications which have NOT CHANGED   Details  aspirin 81 MG tablet Take 81 mg by mouth daily after breakfast. , Until Discontinued, Historical Med    B Complex-C (SUPER B COMPLEX PO) Take 1 tablet by mouth at bedtime. , Until Discontinued, Historical Med    Calcium Carbonate-Vitamin D (CALCIUM + D PO) Take 2 tablets by mouth at bedtime. 600-200 mg tab., Until Discontinued,  Historical Med    celecoxib (CELEBREX) 200 MG capsule Take 200 mg by mouth daily. , Until Discontinued, Historical Med    Cholecalciferol (VITAMIN D3) 5000 UNITS CAPS Take 5,000 Units by mouth daily. , Until Discontinued, Historical Med    fenofibrate micronized (LOFIBRA) 200 MG capsule Take 200 mg by mouth daily after breakfast. , Until Discontinued, Historical Med    furosemide (LASIX) 20 MG tablet Take 20 mg by mouth daily as needed. Fluid , Until Discontinued, Historical Med    gabapentin (NEURONTIN) 100 MG capsule Take 200 mg by mouth daily. , Until Discontinued, Historical Med    Glucosamine-Chondroit-Vit C-Mn (GLUCOSAMINE 1500 COMPLEX PO) Take 2 tablets by mouth at bedtime. , Until Discontinued, Historical Med    levothyroxine (SYNTHROID, LEVOTHROID) 150 MCG tablet Take 150 mcg by mouth daily before breakfast. , Until Discontinued, Historical Med    Magnesium 250 MG TABS Take 1 tablet by mouth at bedtime. , Until Discontinued, Historical Med    Multiple Vitamin (MULTIVITAMIN) tablet Take 1 tablet by mouth daily. , Until Discontinued, Historical Med    olmesartan (BENICAR) 40 MG tablet Take 40 mg by mouth daily after breakfast. , Until Discontinued,  Historical Med    Omega-3 Fatty Acids (FISH OIL) 1200 MG CAPS Take 2 capsules by mouth at bedtime. , Until Discontinued, Historical Med    Polyethyl Glycol-Propyl Glycol (SYSTANE) 0.4-0.3 % SOLN Apply 1-2 drops to eye 2 (two) times daily., Until Discontinued, Historical Med    venlafaxine (EFFEXOR-XR) 75 MG 24 hr capsule Take 75 mg by mouth daily after breakfast. , Until Discontinued, Historical Med    zolpidem (AMBIEN) 10 MG tablet Take 10 mg by mouth at bedtime. , Until Discontinued, Historical Med      STOP taking these medications     acetaminophen (TYLENOL) 325 MG tablet Comments:  Reason for Stopping:       amoxicillin (AMOXIL) 875 MG tablet Comments:  Reason for Stopping:       lidocaine (LIDODERM) 5 % Comments:  Reason for Stopping:            Signed: Anastasio Auerbach. Ellora Varnum   PAC  05/25/2011, 3:27 PM

## 2011-05-31 ENCOUNTER — Encounter (HOSPITAL_COMMUNITY): Payer: Self-pay | Admitting: Orthopedic Surgery

## 2011-06-15 ENCOUNTER — Ambulatory Visit: Payer: Commercial Managed Care - PPO | Attending: Orthopedic Surgery | Admitting: Physical Therapy

## 2011-06-15 DIAGNOSIS — Z96659 Presence of unspecified artificial knee joint: Secondary | ICD-10-CM | POA: Insufficient documentation

## 2011-06-15 DIAGNOSIS — IMO0001 Reserved for inherently not codable concepts without codable children: Secondary | ICD-10-CM | POA: Insufficient documentation

## 2011-06-15 DIAGNOSIS — M25669 Stiffness of unspecified knee, not elsewhere classified: Secondary | ICD-10-CM | POA: Insufficient documentation

## 2011-06-15 DIAGNOSIS — M25569 Pain in unspecified knee: Secondary | ICD-10-CM | POA: Insufficient documentation

## 2011-06-15 DIAGNOSIS — R5381 Other malaise: Secondary | ICD-10-CM | POA: Insufficient documentation

## 2011-06-20 ENCOUNTER — Ambulatory Visit: Payer: Commercial Managed Care - PPO | Admitting: Physical Therapy

## 2011-06-22 ENCOUNTER — Ambulatory Visit: Payer: Commercial Managed Care - PPO | Admitting: Physical Therapy

## 2011-06-27 ENCOUNTER — Ambulatory Visit: Payer: Commercial Managed Care - PPO | Admitting: Physical Therapy

## 2011-06-30 ENCOUNTER — Ambulatory Visit: Payer: Commercial Managed Care - PPO | Admitting: Physical Therapy

## 2011-07-04 ENCOUNTER — Ambulatory Visit: Payer: Commercial Managed Care - PPO | Admitting: Physical Therapy

## 2011-07-06 ENCOUNTER — Encounter: Payer: Commercial Managed Care - PPO | Admitting: Physical Therapy

## 2011-07-07 ENCOUNTER — Ambulatory Visit: Payer: Commercial Managed Care - PPO | Attending: Orthopedic Surgery | Admitting: Physical Therapy

## 2011-07-07 DIAGNOSIS — M25569 Pain in unspecified knee: Secondary | ICD-10-CM | POA: Insufficient documentation

## 2011-07-07 DIAGNOSIS — IMO0001 Reserved for inherently not codable concepts without codable children: Secondary | ICD-10-CM | POA: Insufficient documentation

## 2011-07-07 DIAGNOSIS — R5381 Other malaise: Secondary | ICD-10-CM | POA: Insufficient documentation

## 2011-07-07 DIAGNOSIS — Z96659 Presence of unspecified artificial knee joint: Secondary | ICD-10-CM | POA: Insufficient documentation

## 2011-07-07 DIAGNOSIS — M25669 Stiffness of unspecified knee, not elsewhere classified: Secondary | ICD-10-CM | POA: Insufficient documentation

## 2011-07-11 ENCOUNTER — Ambulatory Visit: Payer: Commercial Managed Care - PPO | Admitting: Physical Therapy

## 2011-07-13 ENCOUNTER — Ambulatory Visit: Payer: Commercial Managed Care - PPO | Admitting: Physical Therapy

## 2011-07-18 ENCOUNTER — Ambulatory Visit: Payer: Commercial Managed Care - PPO | Admitting: Physical Therapy

## 2011-07-20 ENCOUNTER — Ambulatory Visit: Payer: Commercial Managed Care - PPO | Admitting: Physical Therapy

## 2011-07-25 ENCOUNTER — Ambulatory Visit: Payer: Commercial Managed Care - PPO | Admitting: Physical Therapy

## 2011-07-27 ENCOUNTER — Ambulatory Visit: Payer: Commercial Managed Care - PPO | Admitting: Physical Therapy

## 2011-08-01 ENCOUNTER — Ambulatory Visit: Payer: Commercial Managed Care - PPO | Admitting: Physical Therapy

## 2011-08-03 ENCOUNTER — Ambulatory Visit: Payer: Commercial Managed Care - PPO | Admitting: Physical Therapy

## 2011-08-08 ENCOUNTER — Ambulatory Visit: Payer: Commercial Managed Care - PPO | Attending: Family Medicine | Admitting: Physical Therapy

## 2011-08-08 DIAGNOSIS — M25569 Pain in unspecified knee: Secondary | ICD-10-CM | POA: Insufficient documentation

## 2011-08-08 DIAGNOSIS — IMO0001 Reserved for inherently not codable concepts without codable children: Secondary | ICD-10-CM | POA: Insufficient documentation

## 2011-08-08 DIAGNOSIS — R5381 Other malaise: Secondary | ICD-10-CM | POA: Insufficient documentation

## 2011-08-08 DIAGNOSIS — M25669 Stiffness of unspecified knee, not elsewhere classified: Secondary | ICD-10-CM | POA: Insufficient documentation

## 2011-08-10 ENCOUNTER — Ambulatory Visit: Payer: Commercial Managed Care - PPO | Admitting: Physical Therapy

## 2011-08-15 ENCOUNTER — Encounter: Payer: Commercial Managed Care - PPO | Admitting: Physical Therapy

## 2011-08-17 ENCOUNTER — Encounter: Payer: Commercial Managed Care - PPO | Admitting: Physical Therapy

## 2012-02-07 ENCOUNTER — Ambulatory Visit (HOSPITAL_BASED_OUTPATIENT_CLINIC_OR_DEPARTMENT_OTHER): Payer: Commercial Managed Care - PPO | Admitting: Hematology and Oncology

## 2012-02-07 ENCOUNTER — Encounter: Payer: Self-pay | Admitting: Hematology and Oncology

## 2012-02-07 ENCOUNTER — Other Ambulatory Visit (HOSPITAL_BASED_OUTPATIENT_CLINIC_OR_DEPARTMENT_OTHER): Payer: Commercial Managed Care - PPO | Admitting: Lab

## 2012-02-07 ENCOUNTER — Telehealth: Payer: Self-pay | Admitting: Hematology and Oncology

## 2012-02-07 VITALS — BP 153/69 | HR 77 | Temp 97.8°F | Resp 20 | Ht 66.0 in | Wt 196.4 lb

## 2012-02-07 DIAGNOSIS — Z7901 Long term (current) use of anticoagulants: Secondary | ICD-10-CM

## 2012-02-07 DIAGNOSIS — I749 Embolism and thrombosis of unspecified artery: Secondary | ICD-10-CM

## 2012-02-07 DIAGNOSIS — I2699 Other pulmonary embolism without acute cor pulmonale: Secondary | ICD-10-CM

## 2012-02-07 DIAGNOSIS — Z86711 Personal history of pulmonary embolism: Secondary | ICD-10-CM | POA: Insufficient documentation

## 2012-02-07 DIAGNOSIS — D6859 Other primary thrombophilia: Secondary | ICD-10-CM

## 2012-02-07 LAB — CBC WITH DIFFERENTIAL/PLATELET
Basophils Absolute: 0 10*3/uL (ref 0.0–0.1)
Eosinophils Absolute: 0.3 10*3/uL (ref 0.0–0.5)
HCT: 39.6 % (ref 34.8–46.6)
HGB: 13.1 g/dL (ref 11.6–15.9)
MCV: 87.8 fL (ref 79.5–101.0)
MONO%: 9.7 % (ref 0.0–14.0)
NEUT#: 3 10*3/uL (ref 1.5–6.5)
Platelets: 348 10*3/uL (ref 145–400)
RDW: 12.8 % (ref 11.2–14.5)

## 2012-02-07 NOTE — Progress Notes (Signed)
This office note has been dictated.

## 2012-02-07 NOTE — Telephone Encounter (Signed)
gv and printed appt schedule for pt for Dec 2014 °

## 2012-02-07 NOTE — Patient Instructions (Signed)
[name] [name]  [id number]   [facility] - AFTER VISIT SUMMARY   **RECOMMENDATIONS MADE BY THE CONSULTANT AND ANY TEST    RESULTS WILL BE SENT TO YOUR REFERRING DOCTORS.   YOUR EXAM FINDINGS, LABS AND RESULTS WERE DISCUSSED BY YOUR MD TODAY.  YOU CAN GO TO THE [facility] WEB SITE FOR INSTRUCTIONS ON HOW TO ASSESS MY CHART FOR ADDITIONAL INFORMATION AS NEEDED.  Your Updated drug allergies are: Allergies as of 02/07/2012 - Review Complete 02/07/2012  Allergen Reaction Noted  . Crestor (rosuvastatin calcium) Other (See Comments) 02/14/2011  . Erythromycin Hives 02/13/2011  . Pregabalin Other (See Comments) 02/14/2011    Your current list of medications are: Current Outpatient Prescriptions  Medication Sig Dispense Refill  . aspirin 81 MG tablet Take 81 mg by mouth daily after breakfast.       . B Complex-C (SUPER B COMPLEX PO) Take 1 tablet by mouth at bedtime.       . Calcium Carbonate-Vitamin D (CALCIUM + D PO) Take 2 tablets by mouth at bedtime. 600-200 mg tab.      . celecoxib (CELEBREX) 200 MG capsule Take 200 mg by mouth daily.       . Cholecalciferol (VITAMIN D3) 5000 UNITS CAPS Take 5,000 Units by mouth daily.       Marland Kitchen enoxaparin (LOVENOX) 30 MG/0.3ML injection Inject 0.3 mLs (30 mg total) into the skin every 12 (twelve) hours.  14 Syringe  0  . fenofibrate micronized (LOFIBRA) 200 MG capsule Take 200 mg by mouth daily after breakfast.       . ferrous sulfate 325 (65 FE) MG tablet Take 1 tablet (325 mg total) by mouth 3 (three) times daily after meals.      . furosemide (LASIX) 20 MG tablet Take 20 mg by mouth daily as needed. Fluid       . gabapentin (NEURONTIN) 100 MG capsule Take 200 mg by mouth daily.       . Glucosamine-Chondroit-Vit C-Mn (GLUCOSAMINE 1500 COMPLEX PO) Take 2 tablets by mouth at bedtime.       Marland Kitchen levothyroxine (SYNTHROID, LEVOTHROID) 150 MCG tablet Take 150 mcg by mouth daily before breakfast.       . Magnesium 250 MG TABS Take 1 tablet by  mouth at bedtime.       . Multiple Vitamin (MULTIVITAMIN) tablet Take 1 tablet by mouth daily.       Marland Kitchen olmesartan (BENICAR) 40 MG tablet Take 40 mg by mouth daily after breakfast.       . Omega-3 Fatty Acids (FISH OIL) 1200 MG CAPS Take 2 capsules by mouth at bedtime.       Bertram Gala Glycol-Propyl Glycol (SYSTANE) 0.4-0.3 % SOLN Apply 1-2 drops to eye 2 (two) times daily.      Marland Kitchen venlafaxine (EFFEXOR-XR) 75 MG 24 hr capsule Take 75 mg by mouth daily after breakfast.       . warfarin (COUMADIN) 7.5 MG tablet Take 1 tablet (7.5 mg total) by mouth one time only at 6 PM.  30 tablet  0  . zolpidem (AMBIEN) 10 MG tablet Take 10 mg by mouth at bedtime.          INSTRUCTIONS GIVEN AND DISCUSSED:  See attached schedule   SPECIAL INSTRUCTIONS/FOLLOW-UP:  See above.  I acknowledge that I have been informed and understand all the instructions given to me and received a copy.I know to contact the clinic, my physician, or go to the emergency Department if  any problems should occur.   I do not have any more questions at this time, but understand that I may call the [facility] at [phone] during business hours should I have any further questions or need assistance in obtaining follow-up care.

## 2012-02-08 NOTE — Progress Notes (Signed)
CC:   [name], M.D. Madlyn Frankel Charlann Boxer, M.D.  IDENTIFYING STATEMENT:  Patient is a 66 year old woman with a history of recurrent PE on lifelong anticoagulation who presents for followup.  INTERVAL HISTORY:  The patient has no present complaints.  Continues on Coumadin and reports that INRs remain fairly therapeutic at most.  Has had no problems with bruising or bleeding.  MEDICATIONS:  Coumadin dosed through Dr. Darrell Jewel office.  The rest of the medicines reviewed and updated.  PHYSICAL EXAMINATION:  Patient is a well-appearing, well-nourished woman in no distress.  Vitals:  Pulse 77, blood pressure 153/69, temperature 97.8, respirations 20.  Weight 196 pounds.  HEENT:  Head is atraumatic, normocephalic.  Sclerae anicteric.  Mouth is moist.  Neck:  Supple. Chest:  Clear.  Abdomen:  Soft.  Skin:  No petechiae.  LABORATORY DATA:  On 02/07/2012, white cell count 5.9, hemoglobin 13.1, hematocrit 39.6, platelets 348.  BMET pending.  INR/PT pending.  IMPRESSION/PLAN:  Ms. Arista is a 66 year old woman with a history of recurrent pulmonary emboli on indefinite anticoagulation.  She is tolerating Coumadin well.  I did also present the possibility of using Xarelto, which would be a little more user friendly in that frequent moderate is not required.  The downside was an antidote is not available.  I gave her literature to review.  The patient will think about it and discuss with Dr. Corliss Blacker.  Otherwise, she can follow up with me in a year's time or sooner if needed.    ______________________________ Laurice Record, M.D. LIO/MEDQ  D:  02/07/2012  T:  02/08/2012  Job:  [id number]

## 2012-04-27 ENCOUNTER — Telehealth: Payer: Self-pay | Admitting: *Deleted

## 2012-04-27 ENCOUNTER — Encounter: Payer: Self-pay | Admitting: *Deleted

## 2012-04-27 NOTE — Telephone Encounter (Signed)
Per patient reassignment I have attempted to contact the patient. I have left the patient a message to cal me today at [phone] until 12pm, or on Monday at [phone]. I have canceled old appts and made new appts. I also mailed letter and calendar.  JMW

## 2012-09-07 ENCOUNTER — Emergency Department (HOSPITAL_BASED_OUTPATIENT_CLINIC_OR_DEPARTMENT_OTHER): Payer: Commercial Managed Care - PPO

## 2012-09-07 ENCOUNTER — Emergency Department (HOSPITAL_BASED_OUTPATIENT_CLINIC_OR_DEPARTMENT_OTHER)
Admission: EM | Admit: 2012-09-07 | Discharge: 2012-09-07 | Disposition: A | Discharge status: Home / Self Care | Payer: Commercial Managed Care - PPO | Attending: Emergency Medicine | Admitting: Emergency Medicine

## 2012-09-07 ENCOUNTER — Encounter (HOSPITAL_BASED_OUTPATIENT_CLINIC_OR_DEPARTMENT_OTHER): Payer: Self-pay

## 2012-09-07 DIAGNOSIS — I739 Peripheral vascular disease, unspecified: Secondary | ICD-10-CM | POA: Insufficient documentation

## 2012-09-07 DIAGNOSIS — Z862 Personal history of diseases of the blood and blood-forming organs and certain disorders involving the immune mechanism: Secondary | ICD-10-CM | POA: Insufficient documentation

## 2012-09-07 DIAGNOSIS — E039 Hypothyroidism, unspecified: Secondary | ICD-10-CM | POA: Insufficient documentation

## 2012-09-07 DIAGNOSIS — R3915 Urgency of urination: Secondary | ICD-10-CM | POA: Insufficient documentation

## 2012-09-07 DIAGNOSIS — R6883 Chills (without fever): Secondary | ICD-10-CM | POA: Insufficient documentation

## 2012-09-07 DIAGNOSIS — Z8639 Personal history of other endocrine, nutritional and metabolic disease: Secondary | ICD-10-CM | POA: Insufficient documentation

## 2012-09-07 DIAGNOSIS — Z86711 Personal history of pulmonary embolism: Secondary | ICD-10-CM | POA: Insufficient documentation

## 2012-09-07 DIAGNOSIS — Z9889 Other specified postprocedural states: Secondary | ICD-10-CM | POA: Insufficient documentation

## 2012-09-07 DIAGNOSIS — R109 Unspecified abdominal pain: Secondary | ICD-10-CM | POA: Insufficient documentation

## 2012-09-07 DIAGNOSIS — Z7982 Long term (current) use of aspirin: Secondary | ICD-10-CM | POA: Insufficient documentation

## 2012-09-07 DIAGNOSIS — Z8669 Personal history of other diseases of the nervous system and sense organs: Secondary | ICD-10-CM | POA: Insufficient documentation

## 2012-09-07 DIAGNOSIS — Z87891 Personal history of nicotine dependence: Secondary | ICD-10-CM | POA: Insufficient documentation

## 2012-09-07 DIAGNOSIS — M129 Arthropathy, unspecified: Secondary | ICD-10-CM | POA: Insufficient documentation

## 2012-09-07 DIAGNOSIS — I10 Essential (primary) hypertension: Secondary | ICD-10-CM | POA: Insufficient documentation

## 2012-09-07 DIAGNOSIS — N390 Urinary tract infection, site not specified: Secondary | ICD-10-CM | POA: Insufficient documentation

## 2012-09-07 DIAGNOSIS — G4700 Insomnia, unspecified: Secondary | ICD-10-CM | POA: Insufficient documentation

## 2012-09-07 DIAGNOSIS — Z90710 Acquired absence of both cervix and uterus: Secondary | ICD-10-CM | POA: Insufficient documentation

## 2012-09-07 DIAGNOSIS — R300 Dysuria: Secondary | ICD-10-CM | POA: Insufficient documentation

## 2012-09-07 DIAGNOSIS — R350 Frequency of micturition: Secondary | ICD-10-CM | POA: Insufficient documentation

## 2012-09-07 DIAGNOSIS — R110 Nausea: Secondary | ICD-10-CM | POA: Insufficient documentation

## 2012-09-07 DIAGNOSIS — N201 Calculus of ureter: Secondary | ICD-10-CM | POA: Diagnosis not present

## 2012-09-07 DIAGNOSIS — Z8709 Personal history of other diseases of the respiratory system: Secondary | ICD-10-CM | POA: Insufficient documentation

## 2012-09-07 DIAGNOSIS — N1330 Unspecified hydronephrosis: Secondary | ICD-10-CM | POA: Diagnosis not present

## 2012-09-07 DIAGNOSIS — Z9089 Acquired absence of other organs: Secondary | ICD-10-CM | POA: Insufficient documentation

## 2012-09-07 DIAGNOSIS — N200 Calculus of kidney: Secondary | ICD-10-CM | POA: Insufficient documentation

## 2012-09-07 DIAGNOSIS — Z79899 Other long term (current) drug therapy: Secondary | ICD-10-CM | POA: Insufficient documentation

## 2012-09-07 HISTORY — DX: Calculus of kidney: N20.0

## 2012-09-07 LAB — CBC WITH DIFFERENTIAL/PLATELET
HCT: 40.2 % (ref 36.0–46.0)
Hemoglobin: 13.7 g/dL (ref 12.0–15.0)
Lymphocytes Relative: 20 % (ref 12–46)
Lymphs Abs: 1.7 10*3/uL (ref 0.7–4.0)
MCHC: 34.1 g/dL (ref 30.0–36.0)
Monocytes Absolute: 1.1 10*3/uL — ABNORMAL HIGH (ref 0.1–1.0)
Monocytes Relative: 12 % (ref 3–12)
Neutro Abs: 5.4 10*3/uL (ref 1.7–7.7)
WBC: 8.5 10*3/uL (ref 4.0–10.5)

## 2012-09-07 LAB — URINALYSIS, ROUTINE W REFLEX MICROSCOPIC
Glucose, UA: NEGATIVE mg/dL
Ketones, ur: NEGATIVE mg/dL
pH: 7.5 (ref 5.0–8.0)

## 2012-09-07 LAB — BASIC METABOLIC PANEL
BUN: 17 mg/dL (ref 6–23)
CO2: 27 mEq/L (ref 19–32)
Chloride: 100 mEq/L (ref 96–112)
Creatinine, Ser: 0.9 mg/dL (ref 0.50–1.10)

## 2012-09-07 LAB — URINE MICROSCOPIC-ADD ON

## 2012-09-07 MED ORDER — ONDANSETRON HCL 4 MG/2ML IJ SOLN: 4.0000 mg | Freq: Once | INTRAMUSCULAR | Status: DC

## 2012-09-07 MED ORDER — DIPHENHYDRAMINE HCL 50 MG/ML IJ SOLN: 25.0000 mg | Freq: Once | INTRAMUSCULAR | Status: AC

## 2012-09-07 MED ORDER — DIPHENHYDRAMINE HCL 50 MG/ML IJ SOLN
INTRAMUSCULAR | Status: AC
  Administered 2012-09-07: 25 mg via INTRAVENOUS
  Filled 2012-09-07: qty 1

## 2012-09-07 MED ORDER — HYDROMORPHONE HCL PF 1 MG/ML IJ SOLN
INTRAMUSCULAR | Status: AC
  Administered 2012-09-07: 1 mg via INTRAVENOUS
  Filled 2012-09-07: qty 1

## 2012-09-07 MED ORDER — OXYCODONE-ACETAMINOPHEN 5-325 MG PO TABS: 1.0000 | ORAL_TABLET | ORAL | Status: DC | PRN

## 2012-09-07 MED ORDER — ONDANSETRON HCL 4 MG/2ML IJ SOLN
4.0000 mg | Freq: Once | INTRAMUSCULAR | Status: AC
  Administered 2012-09-07: 4 mg via INTRAVENOUS
  Filled 2012-09-07: qty 2

## 2012-09-07 MED ORDER — GENTAMICIN IN SALINE 1.6-0.9 MG/ML-% IV SOLN
80.0000 mg | Freq: Once | INTRAVENOUS | Status: AC
  Administered 2012-09-07: 80 mg via INTRAVENOUS
  Filled 2012-09-07: qty 50

## 2012-09-07 MED ORDER — KETOROLAC TROMETHAMINE 30 MG/ML IJ SOLN
INTRAMUSCULAR | Status: AC
  Administered 2012-09-07: 30 mg
  Filled 2012-09-07: qty 1

## 2012-09-07 MED ORDER — DEXTROSE 5 % IV SOLN
2.0000 g | Freq: Once | INTRAVENOUS | Status: AC
  Administered 2012-09-07: 2 g via INTRAVENOUS
  Filled 2012-09-07: qty 2

## 2012-09-07 MED ORDER — HYDROMORPHONE HCL PF 1 MG/ML IJ SOLN: 1.0000 mg | Freq: Once | INTRAMUSCULAR | Status: AC

## 2012-09-07 MED ORDER — HYDROMORPHONE HCL PF 1 MG/ML IJ SOLN
1.0000 mg | Freq: Once | INTRAMUSCULAR | Status: AC
  Administered 2012-09-07: 1 mg via INTRAVENOUS
  Filled 2012-09-07: qty 1

## 2012-09-07 MED ORDER — TAMSULOSIN HCL 0.4 MG PO CAPS: 0.4000 mg | ORAL_CAPSULE | Freq: Every day | ORAL | Status: DC

## 2012-09-07 NOTE — ED Notes (Signed)
Pt states that she has severe pain in the L flank, pt states that she has hx of kidney stones, pt states that she feels like she has urinary retention.  Denies nausea, vomiting, fever.  Pain for the past week.

## 2012-09-07 NOTE — ED Provider Notes (Signed)
History    CSN: [id number] Arrival date & time 09/07/12  1037  First MD Initiated Contact with Patient 09/07/12 1048     Chief Complaint  Patient presents with  . Nephrolithiasis   (Consider location/radiation/quality/duration/timing/severity/associated sxs/prior Treatment) HPI Comments: Patient noticed intermittent left flank pain that started this week and saw her OB/GYN who checked a urine pouch a urinary tract infection. She was started on Macrobid and had an allergic reaction so started Cipro yesterday. However she states overnight she developed severe pain in the left flank and left lower quadrant radiating into the pubic region. She has symptoms of urinary urgency but feels like she is unable to fully empty her bladder. She states she has felt hot and sweaty but has not taken her temperature and denies fever. Nausea without vomiting and no diarrhea.  Patient is a 67 y.o. female presenting with abdominal pain. The history is provided by the patient.  Abdominal Pain This is a recurrent problem. Episode onset: this week about 5 days ago. The problem occurs constantly. The problem has been rapidly worsening. Associated symptoms include abdominal pain. Pertinent negatives include no shortness of breath. Exacerbated by: urinating. Nothing relieves the symptoms. Treatments tried: abx. The treatment provided no relief.   Past Medical History  Diagnosis Date  . Thyroid disease     Hypothyroidism  . Hypercholesterolemia   . Hyperlipidemia   . Chronic insomnia   . Glaucoma   . Fibromyalgia   . Arthritis   . Hypothyroidism     s/p graves disease  . Sinus infection     at present- dr Boneta Lucks PA aware- was seen there and at PCP 05/10/11  . Peripheral vascular disease     PULMONARY EMBOLUS x 2- 2011, 2012/ FOLLOWED BY DR ODOGWU-LOV 12/12 [facility]   PT STAES WILL STOP COUMADIN 3/12 and begin LOVONOX 05/17/11 as previously instructed  . Complication of anesthesia     severe itching night of surgery  requiring meds- also couldnt swallow- throat "paralyzed""  . Hypertension     PCP Dr Gweneth Dimitri- LOV  05/15/11 with clearance and note on chart,    chest x ray, EKG 12/12 EPIC, eccho 7/11 EP[facility]  . Kidney stone    Past Surgical History  Procedure Laterality Date  . Abdominal hysterectomy    . Exploratory laparotomy    . Joint replacement      left knee  7/12  . Colonoscopy    . Eye surgery      bilateral cataract extraction with IOL,    BILATERAL LASER for glaucoma treatment  . Appendectomy    . Cholecystectomy    . Uterine suspension    . Total knee arthroplasty  05/22/2011    Procedure: TOTAL KNEE ARTHROPLASTY;  Surgeon: Shelda Pal, MD;  Location: [facility];  Service: Orthopedics;  Laterality: Right;   History reviewed. No pertinent family history. History  Substance Use Topics  . Smoking status: Former Smoker -- 1.00 packs/day    Quit date: 05/10/1989  . Smokeless tobacco: Never Used  . Alcohol Use: Yes     Comment: socailly   OB History   Grav Para Term Preterm Abortions TAB SAB Ect Mult Living                 Review of Systems  Constitutional: Positive for chills. Negative for fever.  Respiratory: Negative for cough and shortness of breath.   Cardiovascular: Negative for leg swelling.  Gastrointestinal: Positive for nausea and abdominal pain.  Negative for vomiting and diarrhea.  Genitourinary: Positive for dysuria, urgency, frequency and flank pain.  All other systems reviewed and are negative.    Allergies  Crestor; Erythromycin; Pregabalin; and Macrobid  Home Medications   Current Outpatient Rx  Name  Route  Sig  Dispense  Refill  . aspirin 81 MG tablet   Oral   Take 81 mg by mouth daily after breakfast.          . B Complex-C (SUPER B COMPLEX PO)   Oral   Take 1 tablet by mouth at bedtime.          . Calcium Carbonate-Vitamin D (CALCIUM + D PO)   Oral   Take 2 tablets by mouth at bedtime. 600-200 mg tab.         . celecoxib (CELEBREX) 200  MG capsule   Oral   Take 200 mg by mouth daily.          . Cholecalciferol (VITAMIN D3) 5000 UNITS CAPS   Oral   Take 5,000 Units by mouth daily.          . ciprofloxacin (CIPRO) 250 MG tablet   Oral   Take 250 mg by mouth 2 (two) times daily.         . fenofibrate micronized (LOFIBRA) 200 MG capsule   Oral   Take 200 mg by mouth daily after breakfast.          . furosemide (LASIX) 20 MG tablet   Oral   Take 20 mg by mouth daily as needed. Fluid          . gabapentin (NEURONTIN) 100 MG capsule   Oral   Take 200 mg by mouth daily.          Marland Kitchen levothyroxine (SYNTHROID, LEVOTHROID) 150 MCG tablet   Oral   Take 150 mcg by mouth daily before breakfast.          . Magnesium 250 MG TABS   Oral   Take 1 tablet by mouth at bedtime.          . Multiple Vitamin (MULTIVITAMIN) tablet   Oral   Take 1 tablet by mouth daily.          Marland Kitchen olmesartan (BENICAR) 40 MG tablet   Oral   Take 40 mg by mouth daily after breakfast.          . Omega-3 Fatty Acids (FISH OIL) 1200 MG CAPS   Oral   Take 2 capsules by mouth at bedtime.          Bertram Gala Glycol-Propyl Glycol (SYSTANE) 0.4-0.3 % SOLN   Ophthalmic   Apply 1-2 drops to eye 2 (two) times daily. Both eyes.         Marland Kitchen venlafaxine (EFFEXOR-XR) 75 MG 24 hr capsule   Oral   Take 75 mg by mouth daily after breakfast.          . zolpidem (AMBIEN) 10 MG tablet   Oral   Take 10 mg by mouth at bedtime.          Marland Kitchen EXPIRED: warfarin (COUMADIN) 7.5 MG tablet   Oral   Take 7.5 mg by mouth one time only at 6 PM. Coumadin  2.5 mg  Monday, Wednesday, and Friday, take 5 mg  All other days.          BP 158/77  Pulse 92  Temp(Src) 98.1 F (36.7 C) (Oral)  Resp 20  Ht 5' 5.5" (1.664 m)  Wt 194 lb (87.998 kg)  BMI 31.78 kg/m2  SpO2 99% Physical Exam  Nursing note and vitals reviewed. Constitutional: She is oriented to person, place, and time. She appears well-developed and well-nourished. She appears  distressed.  HENT:  Head: Normocephalic and atraumatic.  Mouth/Throat: Oropharynx is clear and moist.  Eyes: Conjunctivae and EOM are normal. Pupils are equal, round, and reactive to light.  Neck: Normal range of motion. Neck supple.  Cardiovascular: Normal rate, regular rhythm and intact distal pulses.   No murmur heard. Pulmonary/Chest: Effort normal and breath sounds normal. No respiratory distress. She has no wheezes. She has no rales.  Abdominal: Soft. Normal appearance. She exhibits no distension. There is tenderness in the suprapubic area and left lower quadrant. There is CVA tenderness. There is no rebound and no guarding.  Left CVA tenderness  Musculoskeletal: Normal range of motion. She exhibits no edema and no tenderness.  Neurological: She is alert and oriented to person, place, and time.  Skin: Skin is warm and dry. No rash noted. No erythema.  Psychiatric: She has a normal mood and affect. Her behavior is normal.    ED Course  Procedures (including critical care time) Labs Reviewed  URINALYSIS, ROUTINE W REFLEX MICROSCOPIC - Abnormal; Notable for the following:    APPearance TURBID (*)    Hgb urine dipstick MODERATE (*)    Leukocytes, UA LARGE (*)    All other components within normal limits  CBC WITH DIFFERENTIAL - Abnormal; Notable for the following:    Monocytes Absolute 1.1 (*)    All other components within normal limits  BASIC METABOLIC PANEL - Abnormal; Notable for the following:    Glucose, Bld 135 (*)    GFR calc non Af Amer 64 (*)    GFR calc Af Amer 74 (*)    All other components within normal limits  URINE MICROSCOPIC-ADD ON - Abnormal; Notable for the following:    Bacteria, UA MANY (*)    All other components within normal limits  URINE CULTURE   Ct Abdomen Pelvis Wo Contrast  09/07/2012   *RADIOLOGY REPORT*  Clinical Data:  evaluate renal stone  CT ABDOMEN AND PELVIS WITHOUT CONTRAST  Technique:  Multidetector CT imaging of the abdomen and pelvis was  performed following the standard protocol without intravenous contrast.  Comparison: 06/01/2009  Findings: The lung bases appear clear.  No pericardial or pleural effusion identified.  Calcifications noted in the LAD and left circumflex coronary artery.  No focal liver abnormalities identified.  Prior cholecystectomy. No biliary dilatation.  The pancreas appears within normal limits. Normal appearance of the spleen.  The left adrenal gland appears normal.  Right adrenal gland adenomas are again identified.  The right kidney appears normal. No right-sided nephrolithiasis or obstructive uropathy.  There is asymmetric left-sided hydronephrosis and hydroureter.  Stone within the stone within the proximal left ureter measures 4 mm, image 53/series 2.  Urinary bladder appears normal.  There is calcified atherosclerotic disease affecting the abdominal aorta.  There is no aneurysm.  No upper abdominal adenopathy. There is no pelvic or inguinal adenopathy.  The stomach is normal.  The small bowel loops have a normal course and caliber without obstruction.  Normal appearance of the colon. There is no abnormal free fluid or fluid collections identified within the abdomen or pelvis.  Review of the visualized osseous structures is significant for L5-S1 degenerative disc disease.  IMPRESSION:  1.  Left sided hydronephrosis secondary to 4 mm proximal left ureteral calculus.  2.  Incidental note of coronary artery calcifications. 3. Cholecystectomy.   Original Report Authenticated By: Signa Kell, M.D.   No diagnosis found.  MDM   Pt with symptoms concerning for kidney stone vs pyelonephritis.  Denies GI symptoms and seen by OB/GYN this week and started on abx for UTI however pain worsened last night.  Low concern for diverticulitis and no risk factors or history suggestive of AAA.  No hx suggestive of GU source (discharge).  Prior hx of stenting an lithotripsy about 3 years ago for stone which she states feels similar.   Will hydrate, treat pain and ensure no infection with UA, CBC, CMP and will get stone study to further eval.  UA with evidence of UTI.  She initially started macrobid and had reaction and started cipro yesterday.  12:14 PM Pt with infected 4mm proximal left stone with overlying infection in the urine with TNTC WBC's.  Normal CBC and BMP.  Pt improved with dilaudid to 5/10 and started on toradol.  12:57 PM Spoke with [facility] and recommended doing 2 mg Rocephin 80 mg of gentamicin and following up in the office on Monday morning at 9 AM. Patient given strict return precautions.  Gwyneth Sprout, MD 09/07/12 1257

## 2012-09-09 DIAGNOSIS — M479 Spondylosis, unspecified: Secondary | ICD-10-CM | POA: Diagnosis present

## 2012-09-09 DIAGNOSIS — N201 Calculus of ureter: Secondary | ICD-10-CM | POA: Diagnosis present

## 2012-09-09 DIAGNOSIS — Z86711 Personal history of pulmonary embolism: Secondary | ICD-10-CM | POA: Diagnosis not present

## 2012-09-09 DIAGNOSIS — Z7901 Long term (current) use of anticoagulants: Secondary | ICD-10-CM | POA: Diagnosis not present

## 2012-09-09 DIAGNOSIS — Z888 Allergy status to other drugs, medicaments and biological substances status: Secondary | ICD-10-CM | POA: Diagnosis not present

## 2012-09-09 DIAGNOSIS — I10 Essential (primary) hypertension: Secondary | ICD-10-CM | POA: Diagnosis present

## 2012-09-09 DIAGNOSIS — N1330 Unspecified hydronephrosis: Secondary | ICD-10-CM | POA: Diagnosis present

## 2012-09-09 DIAGNOSIS — Z87891 Personal history of nicotine dependence: Secondary | ICD-10-CM | POA: Diagnosis not present

## 2012-09-09 DIAGNOSIS — N390 Urinary tract infection, site not specified: Secondary | ICD-10-CM | POA: Diagnosis present

## 2012-09-09 DIAGNOSIS — N200 Calculus of kidney: Secondary | ICD-10-CM | POA: Diagnosis not present

## 2012-09-09 DIAGNOSIS — E785 Hyperlipidemia, unspecified: Secondary | ICD-10-CM | POA: Diagnosis present

## 2012-09-09 DIAGNOSIS — Z881 Allergy status to other antibiotic agents status: Secondary | ICD-10-CM | POA: Diagnosis not present

## 2012-09-09 DIAGNOSIS — H905 Unspecified sensorineural hearing loss: Secondary | ICD-10-CM | POA: Diagnosis present

## 2012-09-09 LAB — URINE CULTURE: Colony Count: NO GROWTH

## 2013-02-05 ENCOUNTER — Other Ambulatory Visit: Payer: Self-pay | Admitting: Internal Medicine

## 2013-02-05 ENCOUNTER — Other Ambulatory Visit: Payer: Commercial Managed Care - PPO | Admitting: Lab

## 2013-02-05 ENCOUNTER — Ambulatory Visit: Payer: Commercial Managed Care - PPO | Admitting: Hematology and Oncology

## 2013-02-05 ENCOUNTER — Other Ambulatory Visit: Payer: Commercial Managed Care - PPO

## 2013-02-05 ENCOUNTER — Ambulatory Visit: Payer: Commercial Managed Care - PPO

## 2013-02-05 ENCOUNTER — Other Ambulatory Visit: Payer: Self-pay

## 2013-02-05 DIAGNOSIS — I749 Embolism and thrombosis of unspecified artery: Secondary | ICD-10-CM

## 2013-02-05 DIAGNOSIS — D6859 Other primary thrombophilia: Secondary | ICD-10-CM

## 2013-02-06 ENCOUNTER — Ambulatory Visit: Payer: Commercial Managed Care - PPO | Admitting: Hematology and Oncology

## 2013-02-06 ENCOUNTER — Telehealth: Payer: Self-pay | Admitting: *Deleted

## 2013-02-06 ENCOUNTER — Other Ambulatory Visit: Payer: Commercial Managed Care - PPO | Admitting: Lab

## 2013-02-06 ENCOUNTER — Ambulatory Visit: Payer: Commercial Managed Care - PPO | Admitting: Oncology

## 2013-02-06 NOTE — Telephone Encounter (Signed)
sw pt gv appt for 02/10/13. Pt stated for me to cancel the appts she does not want to attend. She also stated that she called to cancel 02/05/13 appts...td

## 2013-02-10 ENCOUNTER — Other Ambulatory Visit: Payer: Commercial Managed Care - PPO

## 2013-02-10 ENCOUNTER — Ambulatory Visit: Payer: Commercial Managed Care - PPO

## 2013-11-13 ENCOUNTER — Other Ambulatory Visit: Payer: Self-pay | Admitting: Otolaryngology

## 2013-11-13 DIAGNOSIS — H9190 Unspecified hearing loss, unspecified ear: Secondary | ICD-10-CM

## 2013-11-20 ENCOUNTER — Ambulatory Visit
Admission: RE | Admit: 2013-11-20 | Discharge: 2013-11-20 | Disposition: A | Discharge status: Home / Self Care | Payer: Commercial Managed Care - PPO | Source: Ambulatory Visit | Attending: Otolaryngology | Admitting: Otolaryngology

## 2013-11-20 DIAGNOSIS — G9389 Other specified disorders of brain: Secondary | ICD-10-CM | POA: Diagnosis not present

## 2013-11-20 DIAGNOSIS — I6789 Other cerebrovascular disease: Secondary | ICD-10-CM | POA: Diagnosis not present

## 2013-11-20 DIAGNOSIS — G319 Degenerative disease of nervous system, unspecified: Secondary | ICD-10-CM | POA: Diagnosis not present

## 2013-11-20 DIAGNOSIS — H9190 Unspecified hearing loss, unspecified ear: Secondary | ICD-10-CM

## 2013-11-20 MED ORDER — GADOBENATE DIMEGLUMINE 529 MG/ML IV SOLN
18.0000 mL | Freq: Once | INTRAVENOUS | Status: AC | PRN
  Administered 2013-11-20: 18 mL via INTRAVENOUS

## 2013-12-12 DIAGNOSIS — Z7901 Long term (current) use of anticoagulants: Secondary | ICD-10-CM | POA: Diagnosis not present

## 2013-12-12 DIAGNOSIS — I2699 Other pulmonary embolism without acute cor pulmonale: Secondary | ICD-10-CM | POA: Diagnosis not present

## 2014-01-01 DIAGNOSIS — R7309 Other abnormal glucose: Secondary | ICD-10-CM | POA: Diagnosis not present

## 2014-01-01 DIAGNOSIS — I10 Essential (primary) hypertension: Secondary | ICD-10-CM | POA: Diagnosis not present

## 2014-01-01 DIAGNOSIS — E782 Mixed hyperlipidemia: Secondary | ICD-10-CM | POA: Diagnosis not present

## 2014-01-01 DIAGNOSIS — E559 Vitamin D deficiency, unspecified: Secondary | ICD-10-CM | POA: Diagnosis not present

## 2014-01-01 DIAGNOSIS — E039 Hypothyroidism, unspecified: Secondary | ICD-10-CM | POA: Diagnosis not present

## 2014-01-06 DIAGNOSIS — R7301 Impaired fasting glucose: Secondary | ICD-10-CM | POA: Diagnosis not present

## 2014-01-06 DIAGNOSIS — M797 Fibromyalgia: Secondary | ICD-10-CM | POA: Diagnosis not present

## 2014-01-06 DIAGNOSIS — E039 Hypothyroidism, unspecified: Secondary | ICD-10-CM | POA: Diagnosis not present

## 2014-01-06 DIAGNOSIS — E782 Mixed hyperlipidemia: Secondary | ICD-10-CM | POA: Diagnosis not present

## 2014-01-06 DIAGNOSIS — F339 Major depressive disorder, recurrent, unspecified: Secondary | ICD-10-CM | POA: Diagnosis not present

## 2014-01-06 DIAGNOSIS — Z23 Encounter for immunization: Secondary | ICD-10-CM | POA: Diagnosis not present

## 2014-01-06 DIAGNOSIS — I10 Essential (primary) hypertension: Secondary | ICD-10-CM | POA: Diagnosis not present

## 2014-01-06 DIAGNOSIS — G4700 Insomnia, unspecified: Secondary | ICD-10-CM | POA: Diagnosis not present

## 2014-01-06 DIAGNOSIS — I2699 Other pulmonary embolism without acute cor pulmonale: Secondary | ICD-10-CM | POA: Diagnosis not present

## 2014-01-09 DIAGNOSIS — Z85828 Personal history of other malignant neoplasm of skin: Secondary | ICD-10-CM | POA: Diagnosis not present

## 2014-01-09 DIAGNOSIS — L812 Freckles: Secondary | ICD-10-CM | POA: Diagnosis not present

## 2014-01-09 DIAGNOSIS — D1801 Hemangioma of skin and subcutaneous tissue: Secondary | ICD-10-CM | POA: Diagnosis not present

## 2014-01-09 DIAGNOSIS — L821 Other seborrheic keratosis: Secondary | ICD-10-CM | POA: Diagnosis not present

## 2014-01-09 DIAGNOSIS — L570 Actinic keratosis: Secondary | ICD-10-CM | POA: Diagnosis not present

## 2014-01-19 DIAGNOSIS — Z7901 Long term (current) use of anticoagulants: Secondary | ICD-10-CM | POA: Diagnosis not present

## 2014-01-19 DIAGNOSIS — I2699 Other pulmonary embolism without acute cor pulmonale: Secondary | ICD-10-CM | POA: Diagnosis not present

## 2014-02-16 DIAGNOSIS — I2699 Other pulmonary embolism without acute cor pulmonale: Secondary | ICD-10-CM | POA: Diagnosis not present

## 2014-02-16 DIAGNOSIS — Z7901 Long term (current) use of anticoagulants: Secondary | ICD-10-CM | POA: Diagnosis not present

## 2014-03-18 DIAGNOSIS — Z7901 Long term (current) use of anticoagulants: Secondary | ICD-10-CM | POA: Diagnosis not present

## 2014-03-18 DIAGNOSIS — I2699 Other pulmonary embolism without acute cor pulmonale: Secondary | ICD-10-CM | POA: Diagnosis not present

## 2014-03-25 DIAGNOSIS — I2699 Other pulmonary embolism without acute cor pulmonale: Secondary | ICD-10-CM | POA: Diagnosis not present

## 2014-03-25 DIAGNOSIS — Z7901 Long term (current) use of anticoagulants: Secondary | ICD-10-CM | POA: Diagnosis not present

## 2014-04-02 DIAGNOSIS — I2699 Other pulmonary embolism without acute cor pulmonale: Secondary | ICD-10-CM | POA: Diagnosis not present

## 2014-04-02 DIAGNOSIS — Z7901 Long term (current) use of anticoagulants: Secondary | ICD-10-CM | POA: Diagnosis not present

## 2014-04-13 DIAGNOSIS — I2699 Other pulmonary embolism without acute cor pulmonale: Secondary | ICD-10-CM | POA: Diagnosis not present

## 2014-04-13 DIAGNOSIS — Z7901 Long term (current) use of anticoagulants: Secondary | ICD-10-CM | POA: Diagnosis not present

## 2014-04-15 DIAGNOSIS — H401331 Pigmentary glaucoma, bilateral, mild stage: Secondary | ICD-10-CM | POA: Diagnosis not present

## 2014-04-15 DIAGNOSIS — H532 Diplopia: Secondary | ICD-10-CM | POA: Diagnosis not present

## 2014-04-15 DIAGNOSIS — I2699 Other pulmonary embolism without acute cor pulmonale: Secondary | ICD-10-CM | POA: Diagnosis not present

## 2014-04-15 DIAGNOSIS — Z7901 Long term (current) use of anticoagulants: Secondary | ICD-10-CM | POA: Diagnosis not present

## 2014-04-23 DIAGNOSIS — Z7901 Long term (current) use of anticoagulants: Secondary | ICD-10-CM | POA: Diagnosis not present

## 2014-04-23 DIAGNOSIS — E782 Mixed hyperlipidemia: Secondary | ICD-10-CM | POA: Diagnosis not present

## 2014-04-23 DIAGNOSIS — I2699 Other pulmonary embolism without acute cor pulmonale: Secondary | ICD-10-CM | POA: Diagnosis not present

## 2014-04-23 DIAGNOSIS — R296 Repeated falls: Secondary | ICD-10-CM | POA: Diagnosis not present

## 2014-04-23 DIAGNOSIS — G459 Transient cerebral ischemic attack, unspecified: Secondary | ICD-10-CM | POA: Diagnosis not present

## 2014-04-24 ENCOUNTER — Other Ambulatory Visit: Payer: Self-pay | Admitting: Family Medicine

## 2014-04-24 DIAGNOSIS — G459 Transient cerebral ischemic attack, unspecified: Secondary | ICD-10-CM

## 2014-04-29 ENCOUNTER — Other Ambulatory Visit: Payer: Commercial Managed Care - PPO

## 2014-05-06 ENCOUNTER — Ambulatory Visit
Admission: RE | Admit: 2014-05-06 | Discharge: 2014-05-06 | Disposition: A | Discharge status: Home / Self Care | Payer: Medicare Other | Source: Ambulatory Visit | Attending: Family Medicine | Admitting: Family Medicine

## 2014-05-06 DIAGNOSIS — I6523 Occlusion and stenosis of bilateral carotid arteries: Secondary | ICD-10-CM | POA: Diagnosis not present

## 2014-05-06 DIAGNOSIS — G459 Transient cerebral ischemic attack, unspecified: Secondary | ICD-10-CM

## 2014-05-07 DIAGNOSIS — I2699 Other pulmonary embolism without acute cor pulmonale: Secondary | ICD-10-CM | POA: Diagnosis not present

## 2014-05-07 DIAGNOSIS — R42 Dizziness and giddiness: Secondary | ICD-10-CM | POA: Diagnosis not present

## 2014-05-07 DIAGNOSIS — Z7901 Long term (current) use of anticoagulants: Secondary | ICD-10-CM | POA: Diagnosis not present

## 2014-05-07 DIAGNOSIS — H532 Diplopia: Secondary | ICD-10-CM | POA: Diagnosis not present

## 2014-05-07 DIAGNOSIS — R296 Repeated falls: Secondary | ICD-10-CM | POA: Diagnosis not present

## 2014-05-07 DIAGNOSIS — I10 Essential (primary) hypertension: Secondary | ICD-10-CM | POA: Diagnosis not present

## 2014-05-08 ENCOUNTER — Other Ambulatory Visit: Payer: Self-pay | Admitting: Family Medicine

## 2014-05-08 DIAGNOSIS — R296 Repeated falls: Secondary | ICD-10-CM

## 2014-05-08 DIAGNOSIS — H532 Diplopia: Secondary | ICD-10-CM

## 2014-05-09 DIAGNOSIS — Z7901 Long term (current) use of anticoagulants: Secondary | ICD-10-CM | POA: Diagnosis not present

## 2014-05-11 DIAGNOSIS — I2699 Other pulmonary embolism without acute cor pulmonale: Secondary | ICD-10-CM | POA: Diagnosis not present

## 2014-05-11 DIAGNOSIS — Z7901 Long term (current) use of anticoagulants: Secondary | ICD-10-CM | POA: Diagnosis not present

## 2014-05-18 DIAGNOSIS — I2699 Other pulmonary embolism without acute cor pulmonale: Secondary | ICD-10-CM | POA: Diagnosis not present

## 2014-05-18 DIAGNOSIS — Z7901 Long term (current) use of anticoagulants: Secondary | ICD-10-CM | POA: Diagnosis not present

## 2014-05-21 ENCOUNTER — Ambulatory Visit
Admission: RE | Admit: 2014-05-21 | Discharge: 2014-05-21 | Disposition: A | Discharge status: Home / Self Care | Payer: Medicare Other | Source: Ambulatory Visit | Attending: Family Medicine | Admitting: Family Medicine

## 2014-05-21 DIAGNOSIS — R296 Repeated falls: Secondary | ICD-10-CM

## 2014-05-21 DIAGNOSIS — R413 Other amnesia: Secondary | ICD-10-CM | POA: Diagnosis not present

## 2014-05-21 DIAGNOSIS — H532 Diplopia: Secondary | ICD-10-CM

## 2014-05-21 MED ORDER — GADOBENATE DIMEGLUMINE 529 MG/ML IV SOLN
18.0000 mL | Freq: Once | INTRAVENOUS | Status: AC | PRN
  Administered 2014-05-21: 18 mL via INTRAVENOUS

## 2014-05-27 ENCOUNTER — Encounter: Payer: Self-pay | Admitting: Diagnostic Neuroimaging

## 2014-05-27 ENCOUNTER — Ambulatory Visit (INDEPENDENT_AMBULATORY_CARE_PROVIDER_SITE_OTHER): Payer: Medicare Other | Admitting: Diagnostic Neuroimaging

## 2014-05-27 VITALS — BP 150/87 | HR 89 | Ht 65.0 in | Wt 212.8 lb

## 2014-05-27 DIAGNOSIS — R413 Other amnesia: Secondary | ICD-10-CM | POA: Diagnosis not present

## 2014-05-27 DIAGNOSIS — R258 Other abnormal involuntary movements: Secondary | ICD-10-CM

## 2014-05-27 DIAGNOSIS — H532 Diplopia: Secondary | ICD-10-CM | POA: Diagnosis not present

## 2014-05-27 DIAGNOSIS — G252 Other specified forms of tremor: Secondary | ICD-10-CM | POA: Diagnosis not present

## 2014-05-27 DIAGNOSIS — R269 Unspecified abnormalities of gait and mobility: Secondary | ICD-10-CM

## 2014-05-27 DIAGNOSIS — R42 Dizziness and giddiness: Secondary | ICD-10-CM
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
## 2014-05-28 LAB — ACETYLCHOLINE RECEPTOR, BINDING

## 2014-06-01 DIAGNOSIS — G252 Other specified forms of tremor: Secondary | ICD-10-CM | POA: Diagnosis not present

## 2014-06-01 DIAGNOSIS — Z7901 Long term (current) use of anticoagulants: Secondary | ICD-10-CM | POA: Diagnosis not present

## 2014-06-01 DIAGNOSIS — R42 Dizziness and giddiness: Secondary | ICD-10-CM | POA: Diagnosis not present

## 2014-06-01 DIAGNOSIS — D6859 Other primary thrombophilia: Secondary | ICD-10-CM | POA: Diagnosis not present

## 2014-06-01 DIAGNOSIS — M797 Fibromyalgia: Secondary | ICD-10-CM | POA: Diagnosis not present

## 2014-06-01 DIAGNOSIS — M25562 Pain in left knee: Secondary | ICD-10-CM | POA: Diagnosis not present

## 2014-06-01 DIAGNOSIS — H532 Diplopia: Secondary | ICD-10-CM | POA: Diagnosis not present

## 2014-06-01 DIAGNOSIS — R413 Other amnesia: Secondary | ICD-10-CM | POA: Diagnosis not present

## 2014-06-01 DIAGNOSIS — M25561 Pain in right knee: Secondary | ICD-10-CM | POA: Diagnosis not present

## 2014-06-01 DIAGNOSIS — E785 Hyperlipidemia, unspecified: Secondary | ICD-10-CM | POA: Diagnosis not present

## 2014-06-01 DIAGNOSIS — Z9181 History of falling: Secondary | ICD-10-CM | POA: Diagnosis not present

## 2014-06-01 DIAGNOSIS — R262 Difficulty in walking, not elsewhere classified: Secondary | ICD-10-CM | POA: Diagnosis not present

## 2014-06-02 DIAGNOSIS — I2699 Other pulmonary embolism without acute cor pulmonale: Secondary | ICD-10-CM | POA: Diagnosis not present

## 2014-06-02 DIAGNOSIS — Z7901 Long term (current) use of anticoagulants: Secondary | ICD-10-CM | POA: Diagnosis not present

## 2014-06-03 DIAGNOSIS — M797 Fibromyalgia: Secondary | ICD-10-CM | POA: Diagnosis not present

## 2014-06-03 DIAGNOSIS — D6859 Other primary thrombophilia: Secondary | ICD-10-CM | POA: Diagnosis not present

## 2014-06-03 DIAGNOSIS — R413 Other amnesia: Secondary | ICD-10-CM | POA: Diagnosis not present

## 2014-06-03 DIAGNOSIS — R262 Difficulty in walking, not elsewhere classified: Secondary | ICD-10-CM | POA: Diagnosis not present

## 2014-06-03 DIAGNOSIS — R42 Dizziness and giddiness: Secondary | ICD-10-CM | POA: Diagnosis not present

## 2014-06-03 DIAGNOSIS — G252 Other specified forms of tremor: Secondary | ICD-10-CM | POA: Diagnosis not present

## 2014-06-04 DIAGNOSIS — M797 Fibromyalgia: Secondary | ICD-10-CM | POA: Diagnosis not present

## 2014-06-04 DIAGNOSIS — R413 Other amnesia: Secondary | ICD-10-CM | POA: Diagnosis not present

## 2014-06-04 DIAGNOSIS — R262 Difficulty in walking, not elsewhere classified: Secondary | ICD-10-CM | POA: Diagnosis not present

## 2014-06-04 DIAGNOSIS — G252 Other specified forms of tremor: Secondary | ICD-10-CM | POA: Diagnosis not present

## 2014-06-04 DIAGNOSIS — R42 Dizziness and giddiness: Secondary | ICD-10-CM | POA: Diagnosis not present

## 2014-06-04 DIAGNOSIS — D6859 Other primary thrombophilia: Secondary | ICD-10-CM | POA: Diagnosis not present

## 2014-06-06 ENCOUNTER — Ambulatory Visit
Admission: RE | Admit: 2014-06-06 | Discharge: 2014-06-06 | Disposition: A | Discharge status: Home / Self Care | Payer: Medicare Other | Source: Ambulatory Visit | Attending: Diagnostic Neuroimaging | Admitting: Diagnostic Neuroimaging

## 2014-06-06 DIAGNOSIS — R42 Dizziness and giddiness: Secondary | ICD-10-CM

## 2014-06-06 DIAGNOSIS — R269 Unspecified abnormalities of gait and mobility: Secondary | ICD-10-CM | POA: Diagnosis not present

## 2014-06-06 DIAGNOSIS — R413 Other amnesia: Secondary | ICD-10-CM

## 2014-06-06 DIAGNOSIS — H532 Diplopia: Secondary | ICD-10-CM | POA: Diagnosis not present

## 2014-06-06 DIAGNOSIS — R258 Other abnormal involuntary movements: Secondary | ICD-10-CM

## 2014-06-06 DIAGNOSIS — G252 Other specified forms of tremor: Secondary | ICD-10-CM

## 2014-06-08 DIAGNOSIS — R413 Other amnesia: Secondary | ICD-10-CM | POA: Diagnosis not present

## 2014-06-08 DIAGNOSIS — R42 Dizziness and giddiness: Secondary | ICD-10-CM | POA: Diagnosis not present

## 2014-06-08 DIAGNOSIS — R262 Difficulty in walking, not elsewhere classified: Secondary | ICD-10-CM | POA: Diagnosis not present

## 2014-06-08 DIAGNOSIS — M797 Fibromyalgia: Secondary | ICD-10-CM | POA: Diagnosis not present

## 2014-06-08 DIAGNOSIS — D6859 Other primary thrombophilia: Secondary | ICD-10-CM | POA: Diagnosis not present

## 2014-06-08 DIAGNOSIS — G252 Other specified forms of tremor: Secondary | ICD-10-CM | POA: Diagnosis not present

## 2014-06-12 DIAGNOSIS — R413 Other amnesia: Secondary | ICD-10-CM | POA: Diagnosis not present

## 2014-06-12 DIAGNOSIS — R262 Difficulty in walking, not elsewhere classified: Secondary | ICD-10-CM | POA: Diagnosis not present

## 2014-06-12 DIAGNOSIS — D6859 Other primary thrombophilia: Secondary | ICD-10-CM | POA: Diagnosis not present

## 2014-06-12 DIAGNOSIS — G252 Other specified forms of tremor: Secondary | ICD-10-CM | POA: Diagnosis not present

## 2014-06-12 DIAGNOSIS — R42 Dizziness and giddiness: Secondary | ICD-10-CM | POA: Diagnosis not present

## 2014-06-12 DIAGNOSIS — M797 Fibromyalgia: Secondary | ICD-10-CM | POA: Diagnosis not present

## 2014-06-15 DIAGNOSIS — R413 Other amnesia: Secondary | ICD-10-CM | POA: Diagnosis not present

## 2014-06-15 DIAGNOSIS — R262 Difficulty in walking, not elsewhere classified: Secondary | ICD-10-CM | POA: Diagnosis not present

## 2014-06-15 DIAGNOSIS — D6859 Other primary thrombophilia: Secondary | ICD-10-CM | POA: Diagnosis not present

## 2014-06-15 DIAGNOSIS — M797 Fibromyalgia: Secondary | ICD-10-CM | POA: Diagnosis not present

## 2014-06-15 DIAGNOSIS — R269 Unspecified abnormalities of gait and mobility: Secondary | ICD-10-CM | POA: Diagnosis not present

## 2014-06-15 DIAGNOSIS — R42 Dizziness and giddiness: Secondary | ICD-10-CM | POA: Diagnosis not present

## 2014-06-15 DIAGNOSIS — G252 Other specified forms of tremor: Secondary | ICD-10-CM | POA: Diagnosis not present

## 2014-06-16 ENCOUNTER — Telehealth: Payer: Self-pay | Admitting: *Deleted

## 2014-06-16 NOTE — Telephone Encounter (Signed)
Spoke to the pt on the phone and was able to get her follow up appt changed since Dr. [name] will not be in the office 07/08/14

## 2014-06-18 DIAGNOSIS — G252 Other specified forms of tremor: Secondary | ICD-10-CM | POA: Diagnosis not present

## 2014-06-18 DIAGNOSIS — D6859 Other primary thrombophilia: Secondary | ICD-10-CM | POA: Diagnosis not present

## 2014-06-18 DIAGNOSIS — R42 Dizziness and giddiness: Secondary | ICD-10-CM | POA: Diagnosis not present

## 2014-06-18 DIAGNOSIS — M797 Fibromyalgia: Secondary | ICD-10-CM | POA: Diagnosis not present

## 2014-06-18 DIAGNOSIS — R413 Other amnesia: Secondary | ICD-10-CM | POA: Diagnosis not present

## 2014-06-18 DIAGNOSIS — R262 Difficulty in walking, not elsewhere classified: Secondary | ICD-10-CM | POA: Diagnosis not present

## 2014-06-19 DIAGNOSIS — G252 Other specified forms of tremor: Secondary | ICD-10-CM | POA: Diagnosis not present

## 2014-06-19 DIAGNOSIS — D6859 Other primary thrombophilia: Secondary | ICD-10-CM | POA: Diagnosis not present

## 2014-06-19 DIAGNOSIS — R262 Difficulty in walking, not elsewhere classified: Secondary | ICD-10-CM | POA: Diagnosis not present

## 2014-06-19 DIAGNOSIS — R42 Dizziness and giddiness: Secondary | ICD-10-CM | POA: Diagnosis not present

## 2014-06-19 DIAGNOSIS — R413 Other amnesia: Secondary | ICD-10-CM | POA: Diagnosis not present

## 2014-06-19 DIAGNOSIS — M797 Fibromyalgia: Secondary | ICD-10-CM | POA: Diagnosis not present

## 2014-07-03 DIAGNOSIS — Z7901 Long term (current) use of anticoagulants: Secondary | ICD-10-CM | POA: Diagnosis not present

## 2014-07-03 DIAGNOSIS — I2699 Other pulmonary embolism without acute cor pulmonale: Secondary | ICD-10-CM | POA: Diagnosis not present

## 2014-07-03 DIAGNOSIS — E039 Hypothyroidism, unspecified: Secondary | ICD-10-CM | POA: Diagnosis not present

## 2014-07-08 ENCOUNTER — Ambulatory Visit: Payer: Medicare Other | Admitting: Diagnostic Neuroimaging

## 2014-07-08 DIAGNOSIS — G4700 Insomnia, unspecified: Secondary | ICD-10-CM | POA: Diagnosis not present

## 2014-07-08 DIAGNOSIS — I10 Essential (primary) hypertension: Secondary | ICD-10-CM | POA: Diagnosis not present

## 2014-07-08 DIAGNOSIS — F339 Major depressive disorder, recurrent, unspecified: Secondary | ICD-10-CM | POA: Diagnosis not present

## 2014-07-08 DIAGNOSIS — I2699 Other pulmonary embolism without acute cor pulmonale: Secondary | ICD-10-CM | POA: Diagnosis not present

## 2014-07-08 DIAGNOSIS — M797 Fibromyalgia: Secondary | ICD-10-CM | POA: Diagnosis not present

## 2014-07-08 DIAGNOSIS — R7301 Impaired fasting glucose: Secondary | ICD-10-CM | POA: Diagnosis not present

## 2014-07-08 DIAGNOSIS — E782 Mixed hyperlipidemia: Secondary | ICD-10-CM | POA: Diagnosis not present

## 2014-07-08 DIAGNOSIS — E039 Hypothyroidism, unspecified: Secondary | ICD-10-CM | POA: Diagnosis not present

## 2014-07-08 DIAGNOSIS — R269 Unspecified abnormalities of gait and mobility: Secondary | ICD-10-CM | POA: Diagnosis not present

## 2014-07-23 ENCOUNTER — Encounter: Payer: Self-pay | Admitting: Diagnostic Neuroimaging

## 2014-07-23 ENCOUNTER — Ambulatory Visit (INDEPENDENT_AMBULATORY_CARE_PROVIDER_SITE_OTHER): Payer: Medicare Other | Admitting: Diagnostic Neuroimaging

## 2014-07-23 VITALS — BP 149/78 | HR 85 | Ht 65.0 in | Wt 209.8 lb

## 2014-07-23 DIAGNOSIS — Z7901 Long term (current) use of anticoagulants: Secondary | ICD-10-CM | POA: Diagnosis not present

## 2014-07-23 DIAGNOSIS — G20 Parkinson's disease: Secondary | ICD-10-CM | POA: Diagnosis not present

## 2014-07-23 DIAGNOSIS — I2699 Other pulmonary embolism without acute cor pulmonale: Secondary | ICD-10-CM | POA: Diagnosis not present

## 2014-07-23 MED ORDER — CARBIDOPA-LEVODOPA 25-100 MG PO TABS
1.0000 | ORAL_TABLET | Freq: Three times a day (TID) | ORAL | Status: DC
Start: 2014-07-23 — End: 2014-09-29
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
## 2014-07-23 NOTE — Patient Instructions (Signed)
Start carbidopa-levodopa half tab before each meal (three times per day). After 1-2 weeks, increase to 1 full tab three times per day.  Use rollator walker.

## 2014-08-07 DIAGNOSIS — I2699 Other pulmonary embolism without acute cor pulmonale: Secondary | ICD-10-CM | POA: Diagnosis not present

## 2014-08-07 DIAGNOSIS — Z7901 Long term (current) use of anticoagulants: Secondary | ICD-10-CM | POA: Diagnosis not present

## 2014-08-14 DIAGNOSIS — Z7901 Long term (current) use of anticoagulants: Secondary | ICD-10-CM | POA: Diagnosis not present

## 2014-08-31 ENCOUNTER — Telehealth: Payer: Self-pay | Admitting: Diagnostic Neuroimaging

## 2014-08-31 NOTE — Telephone Encounter (Signed)
Patient is calling and inquiring about pain management as discussed during her visit in May. Please advise.

## 2014-08-31 NOTE — Telephone Encounter (Signed)
Spoke to Dr. [name] about the pts request, he asked that she go through PCP for this request.  I called the pt and informed her and she stated an understanding and a thanks for calling

## 2014-09-02 DIAGNOSIS — J302 Other seasonal allergic rhinitis: Secondary | ICD-10-CM | POA: Diagnosis not present

## 2014-09-02 DIAGNOSIS — I10 Essential (primary) hypertension: Secondary | ICD-10-CM | POA: Diagnosis not present

## 2014-09-02 DIAGNOSIS — J0100 Acute maxillary sinusitis, unspecified: Secondary | ICD-10-CM | POA: Diagnosis not present

## 2014-09-15 ENCOUNTER — Telehealth: Payer: Self-pay | Admitting: Diagnostic Neuroimaging

## 2014-09-15 NOTE — Telephone Encounter (Signed)
Per Dr [name], spoke with patient and instructed her to with hold Carbidopa-Levodopa tonight. He requests she come tomorrow at 1 pm for FU to discuss further. Also requests she bring a family member for friend with her.   Patient verbalized understanding, agreement.

## 2014-09-15 NOTE — Telephone Encounter (Signed)
Patient is calling and would like to discuss her Rx carbidopa-levodopa 25-100.  She states she had headaches, backaches and nausea. She also says her hips, necks, and everything hurts. Please call.

## 2014-09-15 NOTE — Telephone Encounter (Signed)
Returned patient's call. She states that approximately 3-4 weeks ago she developed headaches, generalized pain, nausea and dizziness. She states that 3 days ago these issues worsened. She is asking if her Carbidopa Levodopa dose is too high or should her dose be increased? She is inquiring if there is another medication to try? She has taken Tylenol x 2 tablets with little relief, stating "It isn't helping with generalized pain, headache, nausea, or dizziness".  She further stated she is not going back to her pcp for pain management, stating "she doesn't deal with  Parkinson's". She states she she was never referred to pain management clinic as she had understood she would be.  She has questions that she would like to discuss with Dr [name], but she states her biggest question is regarding the Carbidopa-Levodopa medication. She is requesting response today regarding this medication. Informed her this RN will route her questions/concerns to Dr [name] for his response. She verbalized understanding.

## 2014-09-16 ENCOUNTER — Ambulatory Visit (INDEPENDENT_AMBULATORY_CARE_PROVIDER_SITE_OTHER): Payer: Medicare Other | Admitting: Diagnostic Neuroimaging

## 2014-09-16 ENCOUNTER — Encounter: Payer: Self-pay | Admitting: Diagnostic Neuroimaging

## 2014-09-16 VITALS — BP 122/71 | HR 92 | Ht 65.0 in | Wt 207.4 lb

## 2014-09-16 DIAGNOSIS — F329 Major depressive disorder, single episode, unspecified: Secondary | ICD-10-CM

## 2014-09-16 DIAGNOSIS — F32A Depression, unspecified: Secondary | ICD-10-CM

## 2014-09-16 DIAGNOSIS — G20 Parkinson's disease: Secondary | ICD-10-CM

## 2014-09-16 DIAGNOSIS — M25519 Pain in unspecified shoulder: Secondary | ICD-10-CM

## 2014-09-16 DIAGNOSIS — I2699 Other pulmonary embolism without acute cor pulmonale: Secondary | ICD-10-CM | POA: Diagnosis not present

## 2014-09-16 DIAGNOSIS — Z7901 Long term (current) use of anticoagulants: Secondary | ICD-10-CM | POA: Diagnosis not present

## 2014-09-16 NOTE — Patient Instructions (Signed)
Taper off carbidopa/levodopa.
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
## 2014-09-16 NOTE — Telephone Encounter (Signed)
Patient called inquiring if she should take the Carbidopa-Levodopa this morning with breakfast. I skyped MaryClare and she instructed the patient to take dose this am and remind her she has an appt at 1pm today with Dr [name].

## 2014-09-17 ENCOUNTER — Telehealth: Payer: Self-pay | Admitting: *Deleted

## 2014-09-17 NOTE — Telephone Encounter (Signed)
Left vm requesting patient call back to reschedule her FU with Dr [name] for Oct 2016. Left number, informed her that any scheduler can help her with this.

## 2014-09-29 ENCOUNTER — Ambulatory Visit (INDEPENDENT_AMBULATORY_CARE_PROVIDER_SITE_OTHER): Payer: Medicare Other | Admitting: Neurology

## 2014-09-29 ENCOUNTER — Encounter: Payer: Self-pay | Admitting: Neurology

## 2014-09-29 ENCOUNTER — Telehealth: Payer: Self-pay | Admitting: Neurology

## 2014-09-29 VITALS — BP 118/68 | HR 68 | Ht 65.5 in | Wt 207.0 lb

## 2014-09-29 DIAGNOSIS — H532 Diplopia: Secondary | ICD-10-CM | POA: Diagnosis not present

## 2014-09-29 DIAGNOSIS — R251 Tremor, unspecified: Secondary | ICD-10-CM | POA: Diagnosis not present

## 2014-09-29 DIAGNOSIS — G20 Parkinson's disease: Secondary | ICD-10-CM | POA: Diagnosis not present

## 2014-09-29 NOTE — Patient Instructions (Signed)
We are referring you to [facility] for a DaT scan. They will contact you directly to set up a time for this testing. If you do not hear from them they can be contacted at [phone].

## 2014-09-29 NOTE — Progress Notes (Signed)
[name] [name] was seen today in the movement disorders clinic for neurologic consultation at the request of MCNEILL,WENDY, MD.   This patient is accompanied in the office by her child who supplements the history.   Prior records made available to me were reviewed.  The patient first saw Dr. [name] in March, 2016 at which point she was complaining about symptoms of diplopia and weakness.  Because of the diplopia she had acetylcholine receptor antibodies that were performed that were negative.  She has had an MRI and MRA of the brain with and without gadolinium.  I reviewed these images.  These were done on 05/21/2014.  This just revealed mild small vessel disease.  She also had a carotid ultrasound with him in March, 2016 revealed less than 50% stenosis bilaterally.  She followed up with him on 07/23/2014 and started on carbidopa/levodopa 25/100, one tablet 3 times per day (8am, 12pm, 5pm, before the meals).  She called back to him and complained about diffuse pain, headaches, depression and nausea.  Although he was not completely convinced that some of these things were not associated with her fibromyalgia and situational depression (her grandson was leaving for college and he serves as a caregiver), it was decided to taper off of her levodopa on 09/16/2014.   She did this and is now off the medication.  She states today that she initially felt great on the medication but then felt that it caused side effects.  Now that she is off of it, however, she c/o difficulty with walking. They also discussed referrals to pain management and psychiatry.  She presents today for a second opinion.  Pt states that diplopia was her first sx and then she began to notice falling.  This started in 04/2013.  The diplopia was horizontal in nature and she initially thought that it was due to lyrica.  The lyrica was d/c and it initally seemed to get better but then it came back.  It gets better if she closes an eye (doesn't know  if it matters which eye she closes).  She has some degree of diplopia daily but mostly at night.  She sees Dr. [name] at [facility] and was told that nothing was wrong with the eyes.    Specific Symptoms:  Tremor: Yes.   (only noticed it 5 times and only in the L hand) - is right handed Voice: no change Sleep: sleeps well with [place]  Vivid Dreams:  Yes.    Acting out dreams:  No. Wet Pillows: No. Postural symptoms:  Yes.    Falls?  Yes.   (have not persisted even though this was presenting c/o) Bradykinesia symptoms: slow movements, difficulty getting out of a chair and difficulty regaining balance Loss of smell:  No. Loss of taste:  Yes.   Urinary Incontinence:  Yes.   (wears pads and has for 3 years - not just stress incontinence) Difficulty Swallowing:  Yes.   (occ coughs with solids, good with pills) Handwriting, micrographia: Yes.   Trouble with ADL's:  No.  Trouble buttoning clothing: No. Depression:  Denies but states that PCP would disagree Memory changes:  Yes.   Hallucinations:  No.  visual distortions: No. N/V:  No. (levodopa did cause nausea) Lightheaded:  Yes.    Syncope: Yes.   (states that had a cramp in her legs several years ago and got up to walk it out and passed out; daughter states that shortly thereafter diagnosed with PE) Diplopia:  No.  Dyskinesia:  No.  Neuroimaging has  previously been performed.  It is available for my review today.  PREVIOUS MEDICATIONS: Sinemet  ALLERGIES:   Allergies  Allergen Reactions  . Crestor [Rosuvastatin Calcium] Other (See Comments)    Feeling very bad, aching all over.  . Erythromycin Hives  . Pregabalin Other (See Comments)    Crossed vision.  Marland Kitchen Macrobid [Nitrofurantoin] Hives  . Carbidopa     Headaches, nausea, dizziness  . Losartan     CURRENT MEDICATIONS:  Outpatient Encounter Prescriptions as of 09/29/2014  Medication Sig  . acetaminophen (TYLENOL) 325 MG tablet Take 650 mg by mouth every 6 (six) hours as  needed.  Marland Kitchen aspirin 81 MG tablet Take 81 mg by mouth daily after breakfast.   . azelastine (ASTELIN) 0.1 % nasal spray Place 1 spray into both nostrils 2 (two) times daily. Use in each nostril as directed  . B Complex-C (SUPER B COMPLEX PO) Take 1 tablet by mouth at bedtime.   . Calcium Carbonate-Vitamin D (CALCIUM + D PO) Take 2 tablets by mouth at bedtime. 600-200 mg tab.  . Cholecalciferol (VITAMIN D3) 5000 UNITS CAPS Take 5,000 Units by mouth daily.   . furosemide (LASIX) 20 MG tablet Take 20 mg by mouth as needed.  . lidocaine (LIDODERM) 5 % Place 1 patch onto the skin daily. Remove & Discard patch within 12 hours or as directed by MD  . Magnesium 250 MG TABS Take 1 tablet by mouth at bedtime.   . Multiple Vitamin (MULTIVITAMIN) tablet Take 1 tablet by mouth daily.   . Omega-3 Fatty Acids (FISH OIL) 1200 MG CAPS Take 2 capsules by mouth at bedtime.   Marland Kitchen SYNTHROID 150 MCG tablet Take 150 mcg by mouth daily before breakfast.   . telmisartan-hydrochlorothiazide (MICARDIS HCT) 40-12.5 MG per tablet Take 1 tablet by mouth daily.   Marland Kitchen venlafaxine XR (EFFEXOR-XR) 75 MG 24 hr capsule Take 75 mg by mouth daily with breakfast.   . warfarin (COUMADIN) 5 MG tablet Take 5 mg by mouth daily. 1 tab Monday, Wednesday, Friday and Saturday. 1/2 tab Tuesday, Thursday and Sunday  . zolpidem (AMBIEN) 10 MG tablet Take 10 mg by mouth at bedtime.   . [DISCONTINUED] loratadine (CLARITIN) 10 MG tablet Take 10 mg by mouth daily.  . [DISCONTINUED] carbidopa-levodopa (SINEMET IR) 25-100 MG per tablet Take 1 tablet by mouth 3 (three) times daily before meals.   No facility-administered encounter medications on file as of 09/29/2014.    PAST MEDICAL HISTORY:   Past Medical History  Diagnosis Date  . Thyroid disease     Hypothyroidism  . Hypercholesterolemia   . Hyperlipidemia   . Chronic insomnia   . Glaucoma   . Fibromyalgia   . Arthritis   . Hypothyroidism     s/p graves disease  . Sinus infection     at  present- dr Honor Loh PA aware- was seen there and at PCP 05/10/11  . Peripheral vascular disease     PULMONARY EMBOLUS x 2- 2011, 2012/ FOLLOWED BY DR ODOGWU-LOV 12/12 EP[facility]   PT STAES WILL STOP COUMADIN 3/12 and begin LOVONOX 05/17/11 as previously instructed  . Complication of anesthesia     severe itching night of surgery requiring meds- also couldnt swallow- throat "paralyzed""  . Hypertension     PCP Dr Cari Caraway- [place]  05/15/11 with clearance and note on chart,    chest x ray, EKG 12/12 EP[facility], eccho 7/11 EP[facility]  . Kidney stone   .  PD (Parkinson's disease)     PAST SURGICAL HISTORY:   Past Surgical History  Procedure Laterality Date  . Abdominal hysterectomy    . Exploratory laparotomy    . Joint replacement      left knee  7/12  . Colonoscopy    . Cataract extraction Bilateral   . Appendectomy    . Cholecystectomy    . Total knee arthroplasty  05/22/2011    Procedure: TOTAL KNEE ARTHROPLASTY;  Surgeon: Mauri Pole, MD;  Location: [facility];  Service: Orthopedics;  Laterality: Right;    SOCIAL HISTORY:   History   Social History  . Marital Status: Widowed    Spouse Name: N/A  . Number of Children: 2  . Years of Education: N/A   Occupational History  . Not on file.   Social History Main Topics  . Smoking status: Former Smoker -- 1.00 packs/day    Quit date: 05/10/1989  . Smokeless tobacco: Never Used  . Alcohol Use: 0.0 oz/week    0 Standard drinks or equivalent per week     Comment: one drink once a month  . Drug Use: No  . Sexual Activity: Not on file   Other Topics Concern  . Not on file   Social History Narrative   Lives at home alone   Drinks caffeine occasionally     FAMILY HISTORY:   Family Status  Relation Status Death Age  . Mother Deceased 62    peritonitis  . Father Deceased 68    stroke, prostate cancer  . Sister Alive     10 siblings - 1 cerebral palsy  . Brother Alive     ROS:  A complete 10 system review of systems was obtained and was  unremarkable apart from what is mentioned above.  PHYSICAL EXAMINATION:    VITALS:   Filed Vitals:   09/29/14 1339  BP: 118/68  Pulse: 68  Height: 5' 5.5" (1.664 m)  Weight: 207 lb (93.895 kg)    GEN:  The patient appears stated age and is in NAD. HEENT:  Normocephalic, atraumatic.  The mucous membranes are moist. The superficial temporal arteries are without ropiness or tenderness.  There are no tongue fasciculations. CV:  RRR Lungs:  CTAB.  She is able to sing the ABCs and her first breath is taken at the letter "H" Neck/HEME:  There are no carotid bruits bilaterally.  There is decreased range of motion of the neck.  She has difficulty turning the head to the right.  Neurological examination:  Orientation: The patient is alert and oriented x3. Fund of knowledge is appropriate.  Recent and remote memory are intact.  Attention and concentration are normal.    Able to name objects and repeat phrases. Cranial nerves: There is good facial symmetry.  There is facial hypomimia.  Pupils are equal round and reactive to light bilaterally. Fundoscopic exam reveals clear margins bilaterally. Extraocular muscles are intact. The visual fields are full to confrontational testing. The speech is fluent and clear.  There are no square wave jerks.  She has no difficulty with the guttural sounds.  Soft palate rises symmetrically and there is no tongue deviation. Hearing is intact to conversational tone. Sensation: Sensation is intact to light and pinprick throughout (facial, trunk, extremities). Vibration is intact at the bilateral big toe. There is no extinction with double simultaneous stimulation. There is no sensory dermatomal level identified. Motor: Strength is 5/5 in the bilateral upper and lower extremities.   Shoulder  shrug is equal and symmetric.  There is no pronator drift.  There are no fasciculations noted. Deep tendon reflexes: Deep tendon reflexes are 1/4 at the bilateral biceps, triceps,  brachioradialis, patella and achilles. Plantar responses are downgoing bilaterally.  Movement examination: Tone: There is normal tone in the bilateral upper extremities.  The tone in the lower extremities is normal.  Abnormal movements: None, even with distraction procedures Coordination:  There is no significant decremation with RAM's, with any form of RAMS, including alternating supination and pronation of the forearm, hand opening and closing, finger taps, heel taps and toe taps. Gait and Station: The patient has mild difficulty arising out of a deep-seated chair without the use of the hands but is able to do it on the first attempt. The patient's stride length is decreased and she has a stooped posture.  The patient has a positive pull test.      ASSESSMENT/PLAN:  1.  Parkinsonism  -While the patient does have some features of parkinsonism, she currently does not meet [place] brain bank criteria for the diagnosis of idiopathic Parkinson's disease.  She may, however, have one of the atypical states.  She has some mild cervical dystonia (has difficulty turning the head to the right) and had early diplopia and falls.  I talked to her and her daughter about these things today.  Talked about the utility of the DaT scan.  I explained to her that this does not diagnose Parkinson's disease versus the atypical states, but could help [name] differentiate to make sure that we don't need to look at other neurologic states more closely.  She did have negative AchR AB's but this is not 100% sensitive.   -We talked extensively about the value of safe, cardiovascular exercise.  -Pt asked multiple questions and I answered them to the best of my ability today.  Much greater than 50% of this 60 minute visit spent in counseling.  -We talked extensively about the value of safe, cardiovascular exercise. 2.  F/U after above completed.

## 2014-09-29 NOTE — Telephone Encounter (Signed)
Referral faxed to [facility] for Dat Scan to [phone] with confirmation received.

## 2014-10-14 DIAGNOSIS — R251 Tremor, unspecified: Secondary | ICD-10-CM | POA: Diagnosis not present

## 2014-10-15 DIAGNOSIS — H9042 Sensorineural hearing loss, unilateral, left ear, with unrestricted hearing on the contralateral side: Secondary | ICD-10-CM | POA: Diagnosis not present

## 2014-10-15 DIAGNOSIS — H93291 Other abnormal auditory perceptions, right ear: Secondary | ICD-10-CM | POA: Diagnosis not present

## 2014-10-15 DIAGNOSIS — H9123 Sudden idiopathic hearing loss, bilateral: Secondary | ICD-10-CM | POA: Diagnosis not present

## 2014-10-16 ENCOUNTER — Telehealth: Payer: Self-pay | Admitting: Neurology

## 2014-10-16 NOTE — Telephone Encounter (Signed)
Appt made with patient.  

## 2014-10-16 NOTE — Telephone Encounter (Signed)
I have pts DaT scan results back if she would like to make appt in next few weeks to discuss

## 2014-10-28 ENCOUNTER — Ambulatory Visit: Payer: Medicare Other | Admitting: Diagnostic Neuroimaging

## 2014-10-28 DIAGNOSIS — D3131 Benign neoplasm of right choroid: Secondary | ICD-10-CM | POA: Diagnosis not present

## 2014-10-28 DIAGNOSIS — H40003 Preglaucoma, unspecified, bilateral: Secondary | ICD-10-CM | POA: Diagnosis not present

## 2014-10-29 ENCOUNTER — Encounter: Payer: Self-pay | Admitting: Neurology

## 2014-10-29 ENCOUNTER — Ambulatory Visit (INDEPENDENT_AMBULATORY_CARE_PROVIDER_SITE_OTHER): Payer: Medicare Other | Admitting: Neurology

## 2014-10-29 VITALS — BP 128/70 | HR 90 | Ht 66.0 in | Wt 209.0 lb

## 2014-10-29 DIAGNOSIS — G232 Striatonigral degeneration: Secondary | ICD-10-CM | POA: Diagnosis not present

## 2014-10-29 MED ORDER — CARBIDOPA-LEVODOPA 25-100 MG PO TABS: 1.0000 | ORAL_TABLET | Freq: Three times a day (TID) | ORAL | Status: DC

## 2014-10-29 NOTE — Progress Notes (Signed)
[name] [name] was seen today in the movement disorders clinic for neurologic consultation at the request of MCNEILL,WENDY, MD.   This patient is accompanied in the office by her child who supplements the history.   Prior records made available to me were reviewed.  The patient first saw Dr. [name] in March, 2016 at which point she was complaining about symptoms of diplopia and weakness.  Because of the diplopia she had acetylcholine receptor antibodies that were performed that were negative.  She has had an MRI and MRA of the brain with and without gadolinium.  I reviewed these images.  These were done on 05/21/2014.  This just revealed mild small vessel disease.  She also had a carotid ultrasound with him in March, 2016 revealed less than 50% stenosis bilaterally.  She followed up with him on 07/23/2014 and started on carbidopa/levodopa 25/100, one tablet 3 times per day (8am, 12pm, 5pm, before the meals).  She called back to him and complained about diffuse pain, headaches, depression and nausea.  Although he was not completely convinced that some of these things were not associated with her fibromyalgia and situational depression (her grandson was leaving for college and he serves as a caregiver), it was decided to taper off of her levodopa on 09/16/2014.   She did this and is now off the medication.  She states today that she initially felt great on the medication but then felt that it caused side effects.  Now that she is off of it, however, she c/o difficulty with walking. They also discussed referrals to pain management and psychiatry.  She presents today for a second opinion.  Pt states that diplopia was her first sx and then she began to notice falling.  This started in 04/2013.  The diplopia was horizontal in nature and she initially thought that it was due to lyrica.  The lyrica was d/c and it initally seemed to get better but then it came back.  It gets better if she closes an eye (doesn't know  if it matters which eye she closes).  She has some degree of diplopia daily but mostly at night.  She sees Dr. [name] at [facility] and was told that nothing was wrong with the eyes.    10/29/14 update:  The patient is following up today.  Overall, she states that she is feeling well.  She really hasn't had significant diplopia since our last visit.  She had her DaT scan on 10/14/2014 and while tracer uptake was present in the bilateral attainment, it was decreased on the right putamen relative to the right caudate, which can be seen in early parkinsonian syndromes.  The patient admits that she is still very slow.  She saw a different ophthalmologist [facility] imaging since our last visit and states that the diagnosis of glaucoma was retracted and everything looked well.  She denies any falls since our last visit no hallucinations.  PREVIOUS MEDICATIONS: Sinemet  ALLERGIES:   Allergies  Allergen Reactions  . Crestor [Rosuvastatin Calcium] Other (See Comments)    Feeling very bad, aching all over.  . Erythromycin Hives  . Pregabalin Other (See Comments)    Crossed vision.  Marland Kitchen Macrobid [Nitrofurantoin] Hives  . Carbidopa     Headaches, nausea, dizziness  . Losartan     CURRENT MEDICATIONS:  Outpatient Encounter Prescriptions as of 10/29/2014  Medication Sig  . acetaminophen (TYLENOL) 325 MG tablet Take 650 mg by mouth every 6 (six) hours as needed.  Marland Kitchen  aspirin 81 MG tablet Take 81 mg by mouth daily after breakfast.   . azelastine (ASTELIN) 0.1 % nasal spray Place 1 spray into both nostrils 2 (two) times daily. Use in each nostril as directed  . B Complex-C (SUPER B COMPLEX PO) Take 1 tablet by mouth at bedtime.   . Calcium Carbonate-Vitamin D (CALCIUM + D PO) Take 2 tablets by mouth at bedtime. 600-200 mg tab.  . Cholecalciferol (VITAMIN D3) 5000 UNITS CAPS Take 5,000 Units by mouth daily.   . furosemide (LASIX) 20 MG tablet Take 20 mg by mouth as needed.  . lidocaine (LIDODERM) 5 % Place  1 patch onto the skin daily. Remove & Discard patch within 12 hours or as directed by MD  . Magnesium 250 MG TABS Take 1 tablet by mouth at bedtime.   . Multiple Vitamin (MULTIVITAMIN) tablet Take 1 tablet by mouth daily.   . Omega-3 Fatty Acids (FISH OIL) 1200 MG CAPS Take 2 capsules by mouth at bedtime.   Marland Kitchen SYNTHROID 150 MCG tablet Take 150 mcg by mouth daily before breakfast.   . telmisartan-hydrochlorothiazide (MICARDIS HCT) 40-12.5 MG per tablet Take 1 tablet by mouth daily.   Marland Kitchen venlafaxine XR (EFFEXOR-XR) 75 MG 24 hr capsule Take 75 mg by mouth daily with breakfast.   . warfarin (COUMADIN) 5 MG tablet Take 5 mg by mouth daily. 1 tab Monday, Wednesday, Friday and Saturday. 1/2 tab Tuesday, Thursday and Sunday  . zolpidem (AMBIEN) 10 MG tablet Take 10 mg by mouth at bedtime.   . carbidopa-levodopa (SINEMET IR) 25-100 MG per tablet Take 1 tablet by mouth 3 (three) times daily.   No facility-administered encounter medications on file as of 10/29/2014.    PAST MEDICAL HISTORY:   Past Medical History  Diagnosis Date  . Thyroid disease     Hypothyroidism  . Hypercholesterolemia   . Hyperlipidemia   . Chronic insomnia   . Glaucoma   . Fibromyalgia   . Arthritis   . Hypothyroidism     s/p graves disease  . Sinus infection     at present- dr Honor Loh PA aware- was seen there and at PCP 05/10/11  . Peripheral vascular disease     PULMONARY EMBOLUS x 2- 2011, 2012/ FOLLOWED BY DR ODOGWU-LOV 12/12 EP[facility]   PT STAES WILL STOP COUMADIN 3/12 and begin LOVONOX 05/17/11 as previously instructed  . Complication of anesthesia     severe itching night of surgery requiring meds- also couldnt swallow- throat "paralyzed""  . Hypertension     PCP Dr Cari Caraway- [place]  05/15/11 with clearance and note on chart,    chest x ray, EKG 12/12 EPIC, eccho 7/11 EP[facility]  . Kidney stone   . PD (Parkinson's disease)     PAST SURGICAL HISTORY:   Past Surgical History  Procedure Laterality Date  . Abdominal  hysterectomy    . Exploratory laparotomy    . Joint replacement      left knee  7/12  . Colonoscopy    . Cataract extraction Bilateral   . Appendectomy    . Cholecystectomy    . Total knee arthroplasty  05/22/2011    Procedure: TOTAL KNEE ARTHROPLASTY;  Surgeon: Mauri Pole, MD;  Location: [facility];  Service: Orthopedics;  Laterality: Right;    SOCIAL HISTORY:   Social History   Social History  . Marital Status: Widowed    Spouse Name: N/A  . Number of Children: 2  . Years of Education: N/A  Occupational History  . Not on file.   Social History Main Topics  . Smoking status: Former Smoker -- 1.00 packs/day    Quit date: 05/10/1989  . Smokeless tobacco: Never Used  . Alcohol Use: 0.0 oz/week    0 Standard drinks or equivalent per week     Comment: one drink once a month  . Drug Use: No  . Sexual Activity: Not on file   Other Topics Concern  . Not on file   Social History Narrative   Lives at home alone   Drinks caffeine occasionally     FAMILY HISTORY:   Family Status  Relation Status Death Age  . Mother Deceased 60    peritonitis  . Father Deceased 26    stroke, prostate cancer  . Sister Alive     10 siblings - 1 cerebral palsy  . Brother Alive     ROS:  A complete 10 system review of systems was obtained and was unremarkable apart from what is mentioned above.  PHYSICAL EXAMINATION:    VITALS:   Filed Vitals:   10/29/14 1421  BP: 128/70  Pulse: 90  Height: 5\' 6"  (1.676 m)  Weight: 209 lb (94.802 kg)    GEN:  The patient appears stated age and is in NAD. HEENT:  Normocephalic, atraumatic.  CV:  RRR Lungs:  CTAB.   Neck/HEME:  There are no carotid bruits bilaterally.  There is decreased range of motion of the neck.  She has difficulty turning the head to the right.  Neurological examination:  Orientation: The patient is alert and oriented x3.  Cranial nerves: There is good facial symmetry.  There is facial hypomimia.  Pupils are equal round  and reactive to light bilaterally. Fundoscopic exam reveals clear margins bilaterally. Extraocular muscles are intact. The visual fields are full to confrontational testing. The speech is fluent and clear.  There are no square wave jerks.  She has no difficulty with the guttural sounds.  Soft palate rises symmetrically and there is no tongue deviation. Hearing is intact to conversational tone. Sensation: Sensation is intact to light touch throughout. Motor: Strength is 5/5 in the bilateral upper and lower extremities.   Shoulder shrug is equal and symmetric.  There is no pronator drift.  There are no fasciculations noted.   Movement examination: Tone: There is normal tone in the bilateral upper extremities.  The tone in the lower extremities is normal.  Abnormal movements: None, even with distraction procedures Coordination:  There is no significant decremation with RAM's, with any form of RAMS, including alternating supination and pronation of the forearm, hand opening and closing, finger taps, heel taps and toe taps. Gait and Station: The patient has mild difficulty arising out of a deep-seated chair without the use of the hands but is able to do it on the first attempt. The patient's stride length is decreased and she has a stooped posture.  Arm swing is decreased.  The patient has a positive pull test.      ASSESSMENT/PLAN:  1.  Parkinsonism  -While the patient does have some features of parkinsonism, she currently does not meet [place] brain bank criteria for the diagnosis of idiopathic Parkinson's disease.  She may, however, have one of the atypical states.  She has some mild cervical dystonia (has difficulty turning the head to the right) and had early diplopia and falls.  Her DaT scan demonstrated decreased uptake of the tracer in the right putamen, but it was very mild.  She and I discussed these results today.  It is my suspicion that she may have MSA.  We talked about trying to go back on the  levodopa, this time at a better dose.  She decided that she would like to try, but we will just work her up to carbidopa/levodopa 25/100, one tablet 3 times per day and then I will see her back in about 6 weeks and if she is tolerating the medication, I will likely push the drug up further.  2.  .Much greater than 50% of this visit was spent in counseling with the patient.  Total face to face time:  25 min

## 2014-10-29 NOTE — Patient Instructions (Signed)
Start carbidopa/levodopa 25/100:   1/2 tab three times a day before meals x 1 wk, then 1/2 in am & noon & 1 in evening for a week, then 1/2 in am &1 at noon &one in evening for a week, then 1 tablet three times a day before meals 

## 2014-11-02 ENCOUNTER — Encounter: Payer: Self-pay | Admitting: Neurology

## 2014-11-05 DIAGNOSIS — R609 Edema, unspecified: Secondary | ICD-10-CM | POA: Diagnosis not present

## 2014-11-05 DIAGNOSIS — M25552 Pain in left hip: Secondary | ICD-10-CM | POA: Diagnosis not present

## 2014-11-05 DIAGNOSIS — M25551 Pain in right hip: Secondary | ICD-10-CM | POA: Diagnosis not present

## 2014-11-13 DIAGNOSIS — I2699 Other pulmonary embolism without acute cor pulmonale: Secondary | ICD-10-CM | POA: Diagnosis not present

## 2014-11-13 DIAGNOSIS — Z7901 Long term (current) use of anticoagulants: Secondary | ICD-10-CM | POA: Diagnosis not present

## 2014-12-09 DIAGNOSIS — M791 Myalgia: Secondary | ICD-10-CM | POA: Diagnosis not present

## 2014-12-09 DIAGNOSIS — S199XXA Unspecified injury of neck, initial encounter: Secondary | ICD-10-CM | POA: Diagnosis not present

## 2014-12-09 DIAGNOSIS — S0990XA Unspecified injury of head, initial encounter: Secondary | ICD-10-CM | POA: Diagnosis not present

## 2014-12-09 DIAGNOSIS — I672 Cerebral atherosclerosis: Secondary | ICD-10-CM | POA: Diagnosis not present

## 2014-12-09 DIAGNOSIS — R112 Nausea with vomiting, unspecified: Secondary | ICD-10-CM | POA: Diagnosis not present

## 2014-12-09 DIAGNOSIS — R51 Headache: Secondary | ICD-10-CM | POA: Diagnosis not present

## 2014-12-09 DIAGNOSIS — M5030 Other cervical disc degeneration, unspecified cervical region: Secondary | ICD-10-CM | POA: Diagnosis not present

## 2014-12-10 ENCOUNTER — Encounter: Payer: Self-pay | Admitting: Neurology

## 2014-12-10 ENCOUNTER — Ambulatory Visit (INDEPENDENT_AMBULATORY_CARE_PROVIDER_SITE_OTHER): Payer: Medicare Other | Admitting: Neurology

## 2014-12-10 VITALS — BP 110/70 | HR 84 | Ht 66.0 in | Wt 204.0 lb

## 2014-12-10 DIAGNOSIS — G232 Striatonigral degeneration: Secondary | ICD-10-CM | POA: Diagnosis not present

## 2014-12-10 MED ORDER — CARBIDOPA-LEVODOPA ER 36.25-145 MG PO CPCR: 1.0000 | ORAL_CAPSULE | Freq: Three times a day (TID) | ORAL | Status: DC

## 2014-12-10 MED ORDER — CARBIDOPA-LEVODOPA ER 23.75-95 MG PO CPCR: 1.0000 | ORAL_CAPSULE | Freq: Three times a day (TID) | ORAL | Status: DC

## 2014-12-10 NOTE — Patient Instructions (Signed)
1. You have been referred to [facility]. They will call you directly to schedule an appointment.  Please call [phone] if you do not hear from them.  2. Start Rytary 95 mg - 1 tablet three times daily for 3 days, then switch to Rytary 145 mg - 1 tablet three times daily. Samples provided. Let us know how you do on the medication.

## 2014-12-10 NOTE — Progress Notes (Signed)
[name] [name] was seen today in the movement disorders clinic for neurologic consultation at the request of MCNEILL,WENDY, MD.   This patient is accompanied in the office by her child who supplements the history.   Prior records made available to me were reviewed.  The patient first saw Dr. [name] in March, 2016 at which point she was complaining about symptoms of diplopia and weakness.  Because of the diplopia she had acetylcholine receptor antibodies that were performed that were negative.  She has had an MRI and MRA of the brain with and without gadolinium.  I reviewed these images.  These were done on 05/21/2014.  This just revealed mild small vessel disease.  She also had a carotid ultrasound with him in March, 2016 revealed less than 50% stenosis bilaterally.  She followed up with him on 07/23/2014 and started on carbidopa/levodopa 25/100, one tablet 3 times per day (8am, 12pm, 5pm, before the meals).  She called back to him and complained about diffuse pain, headaches, depression and nausea.  Although he was not completely convinced that some of these things were not associated with her fibromyalgia and situational depression (her grandson was leaving for college and he serves as a caregiver), it was decided to taper off of her levodopa on 09/16/2014.   She did this and is now off the medication.  She states today that she initially felt great on the medication but then felt that it caused side effects.  Now that she is off of it, however, she c/o difficulty with walking. They also discussed referrals to pain management and psychiatry.  She presents today for a second opinion.  Pt states that diplopia was her first sx and then she began to notice falling.  This started in 04/2013.  The diplopia was horizontal in nature and she initially thought that it was due to lyrica.  The lyrica was d/c and it initally seemed to get better but then it came back.  It gets better if she closes an eye (doesn't know  if it matters which eye she closes).  She has some degree of diplopia daily but mostly at night.  She sees Dr. [name] at [facility] and was told that nothing was wrong with the eyes.    10/29/14 update:  The patient is following up today.  Overall, she states that she is feeling well.  She really hasn't had significant diplopia since our last visit.  She had her DaT scan on 10/14/2014 and while tracer uptake was present in the bilateral attainment, it was decreased on the right putamen relative to the right caudate, which can be seen in early parkinsonian syndromes.  The patient admits that she is still very slow.  She saw a different ophthalmologist [facility] imaging since our last visit and states that the diagnosis of glaucoma was retracted and everything looked well.  She denies any falls since our last visit no hallucinations.  12/10/14 update:  The patient is accompanied by her daughter who supplements the history.  I started the patient on carbidopa/levodopa 25/100 tid last visit.  Today, she states that she thinks that the medication makes her fibromyalgia worse but admits that it could be the weather.  Admits to a fall yesterday.   She was going into the house and there is an odd shaped stair (L shaped landing) and she fell backwards.   She called 911 and she went to [facility] and she had a CT brain and she states that it  was negative.  Had her INR checked and it was 2.6.  Does state that she was vomiting on way on the way to the hospital so they kept her for a few hours.  Still nauseated some today but has been nauseated even prior to that and wonders if the medication contributes.  She did not take any carbidopa/levodopa today.   PREVIOUS MEDICATIONS: Sinemet  ALLERGIES:   Allergies  Allergen Reactions  . Crestor [Rosuvastatin Calcium] Other (See Comments)    Feeling very bad, aching all over.  . Erythromycin Hives  . Pregabalin Other (See Comments)    Crossed vision.  Marland Kitchen Macrobid  [Nitrofurantoin] Hives  . Carbidopa     Headaches, nausea, dizziness  . Losartan     CURRENT MEDICATIONS:  Outpatient Encounter Prescriptions as of 12/10/2014  Medication Sig  . acetaminophen (TYLENOL) 325 MG tablet Take 650 mg by mouth every 6 (six) hours as needed.  Marland Kitchen aspirin 81 MG tablet Take 81 mg by mouth daily after breakfast.   . azelastine (ASTELIN) 0.1 % nasal spray Place 1 spray into both nostrils 2 (two) times daily. Use in each nostril as directed  . B Complex-C (SUPER B COMPLEX PO) Take 1 tablet by mouth at bedtime.   . Calcium Carbonate-Vitamin D (CALCIUM + D PO) Take 2 tablets by mouth at bedtime. 600-200 mg tab.  . carbidopa-levodopa (SINEMET IR) 25-100 MG per tablet Take 1 tablet by mouth 3 (three) times daily.  . Cholecalciferol (VITAMIN D3) 5000 UNITS CAPS Take 5,000 Units by mouth daily.   . Magnesium 250 MG TABS Take 1 tablet by mouth at bedtime.   . Multiple Vitamin (MULTIVITAMIN) tablet Take 1 tablet by mouth daily.   . Omega-3 Fatty Acids (FISH OIL) 1200 MG CAPS Take 2 capsules by mouth at bedtime.   . ondansetron (ZOFRAN-ODT) 4 MG disintegrating tablet Take 4 mg by mouth.  . SYNTHROID 150 MCG tablet Take 150 mcg by mouth daily before breakfast.   . telmisartan-hydrochlorothiazide (MICARDIS HCT) 40-12.5 MG per tablet Take 1 tablet by mouth daily.   Marland Kitchen venlafaxine XR (EFFEXOR-XR) 75 MG 24 hr capsule Take 75 mg by mouth daily with breakfast.   . warfarin (COUMADIN) 5 MG tablet Take 5 mg by mouth daily. 1 tab Monday, Wednesday, Friday and Saturday. 1/2 tab Tuesday, Thursday and Sunday  . zolpidem (AMBIEN) 10 MG tablet Take 10 mg by mouth at bedtime.   . Carbidopa-Levodopa ER (RYTARY) 23.75-95 MG CPCR Take 1 tablet by mouth 3 (three) times daily.  . Carbidopa-Levodopa ER (RYTARY) 36.25-145 MG CPCR Take 1 tablet by mouth 3 (three) times daily.  . [DISCONTINUED] furosemide (LASIX) 20 MG tablet Take 20 mg by mouth as needed.  . [DISCONTINUED] lidocaine (LIDODERM) 5 % Place  1 patch onto the skin daily. Remove & Discard patch within 12 hours or as directed by MD   No facility-administered encounter medications on file as of 12/10/2014.    PAST MEDICAL HISTORY:   Past Medical History  Diagnosis Date  . Thyroid disease     Hypothyroidism  . Hypercholesterolemia   . Hyperlipidemia   . Chronic insomnia   . Glaucoma   . Fibromyalgia   . Arthritis   . Hypothyroidism     s/p graves disease  . Sinus infection     at present- dr Honor Loh PA aware- was seen there and at PCP 05/10/11  . Peripheral vascular disease     PULMONARY EMBOLUS x 2- 2011, 2012/ FOLLOWED BY DR ODOGWU-LOV  12/12 EPIC   PT STAES WILL STOP COUMADIN 3/12 and begin LOVONOX 05/17/11 as previously instructed  . Complication of anesthesia     severe itching night of surgery requiring meds- also couldnt swallow- throat "paralyzed""  . Hypertension     PCP Dr Cari Caraway- [place]  05/15/11 with clearance and note on chart,    chest x ray, EKG 12/12 EPIC, eccho 7/11 EPIC  . Kidney stone   . PD (Parkinson's disease)     PAST SURGICAL HISTORY:   Past Surgical History  Procedure Laterality Date  . Abdominal hysterectomy    . Exploratory laparotomy    . Joint replacement      left knee  7/12  . Colonoscopy    . Cataract extraction Bilateral   . Appendectomy    . Cholecystectomy    . Total knee arthroplasty  05/22/2011    Procedure: TOTAL KNEE ARTHROPLASTY;  Surgeon: Mauri Pole, MD;  Location: [facility];  Service: Orthopedics;  Laterality: Right;    SOCIAL HISTORY:   Social History   Social History  . Marital Status: Widowed    Spouse Name: N/A  . Number of Children: 2  . Years of Education: N/A   Occupational History  . Not on file.   Social History Main Topics  . Smoking status: Former Smoker -- 1.00 packs/day    Quit date: 05/10/1989  . Smokeless tobacco: Never Used  . Alcohol Use: 0.0 oz/week    0 Standard drinks or equivalent per week     Comment: one drink once a month  . Drug Use:  No  . Sexual Activity: Not on file   Other Topics Concern  . Not on file   Social History Narrative   Lives at home alone   Drinks caffeine occasionally     FAMILY HISTORY:   Family Status  Relation Status Death Age  . Mother Deceased 92    peritonitis  . Father Deceased 75    stroke, prostate cancer  . Sister Alive     10 siblings - 1 cerebral palsy  . Brother Alive     ROS:  A complete 10 system review of systems was obtained and was unremarkable apart from what is mentioned above.  PHYSICAL EXAMINATION:    VITALS:   Filed Vitals:   12/10/14 1424  BP: 110/70  Pulse: 84  Height: 5\' 6"  (1.676 m)  Weight: 204 lb (92.534 kg)    GEN:  The patient appears stated age and is in NAD. HEENT:  Normocephalic, atraumatic.  CV:  RRR Lungs:  CTAB.   Neck/HEME:  There are no carotid bruits bilaterally.  There is decreased range of motion of the neck.  She has difficulty turning the head to the right.  Neurological examination:  Orientation: The patient is alert and oriented x3.  Cranial nerves: There is good facial symmetry.  There is facial hypomimia.   The speech is fluent and clear.  There are no square wave jerks.  She has no difficulty with the guttural sounds.  Soft palate rises symmetrically and there is no tongue deviation. Hearing is intact to conversational tone. Sensation: Sensation is intact to light touch throughout. Motor: Strength is 5/5 in the bilateral upper and lower extremities.   Shoulder shrug is equal and symmetric.  There is no pronator drift.  There are no fasciculations noted.   Movement examination: Tone: There is normal tone in the bilateral upper extremities.  The tone in the lower extremities  is normal.  Abnormal movements: None, even with distraction procedures Coordination:  There is decreased finger taps and toe taps, left greater than right Gait and Station: The patient has mild difficulty arising out of a deep-seated chair without the use of  the hands but is able to do it on the first attempt. The patient's stride length is decreased and she has a stooped posture.  There is camptocormia to the right.  Arm swing is decreased.    ASSESSMENT/PLAN:  1.  Parkinsonism  -While the patient does have some features of parkinsonism, she currently does not meet [place] brain bank criteria for the diagnosis of idiopathic Parkinson's disease.  She may, however, have one of the atypical states.  She has some mild cervical dystonia (has difficulty turning the head to the right) and had early diplopia and falls.  Her DaT scan demonstrated decreased uptake of the tracer in the right putamen, but it was very mild.  She and I discussed these results again today.  It is my suspicion that she may have MSA.  Her daughter was not present last visit and the patient asked multiple questions today.  I am not convinced that everything that she reports as "side effects" of levodopa are really side effects.  She thought that it made her fibromyalgia worse, and I told her that I absolutely did not think that was a side effect of levodopa.  Ultimately, we decided to try Rytary, 95 mg 3 times a day for 3 days and then 145 mg 3 times a day thereafter.  She may need a higher dose.  We will reevaluate that in the future.   -I sent her a referral to neuro rehabilitation center for therapy  -I talked to her about the importance of safe, cardiovascular exercise. 2.  .Much greater than 50% of this visit was spent in counseling with the patient.  Total face to face time:  40 min

## 2014-12-23 ENCOUNTER — Telehealth: Payer: Self-pay | Admitting: Neurology

## 2014-12-23 NOTE — Telephone Encounter (Signed)
[name] made aware PT needed.

## 2014-12-23 NOTE — Telephone Encounter (Signed)
[name]/ [name] op/ called to inform that the referral for/ [name] [name] doesn't specify which therapy?/call back @ [phone]

## 2014-12-24 ENCOUNTER — Telehealth: Payer: Self-pay | Admitting: Neurology

## 2014-12-24 MED ORDER — CARBIDOPA-LEVODOPA ER 36.25-145 MG PO CPCR: 1.0000 | ORAL_CAPSULE | Freq: Three times a day (TID) | ORAL | Status: DC

## 2014-12-24 NOTE — Telephone Encounter (Signed)
Pt left message on the voice mail not sure what she needed she just asked to speak to a nurse please call [phone]

## 2014-12-24 NOTE — Telephone Encounter (Signed)
Patient states she has titrated to Rytary 145 mg - 1 tablet 3 times daily and doing really well. She needs more samples and would like RX sent to pharmacy to see how much this will cost. RX sent to pharmacy and samples at front for patient pick up.

## 2015-01-01 DIAGNOSIS — I10 Essential (primary) hypertension: Secondary | ICD-10-CM | POA: Diagnosis not present

## 2015-01-01 DIAGNOSIS — R7301 Impaired fasting glucose: Secondary | ICD-10-CM | POA: Diagnosis not present

## 2015-01-01 DIAGNOSIS — E782 Mixed hyperlipidemia: Secondary | ICD-10-CM | POA: Diagnosis not present

## 2015-01-01 DIAGNOSIS — Z7901 Long term (current) use of anticoagulants: Secondary | ICD-10-CM | POA: Diagnosis not present

## 2015-01-01 DIAGNOSIS — E039 Hypothyroidism, unspecified: Secondary | ICD-10-CM | POA: Diagnosis not present

## 2015-01-05 ENCOUNTER — Ambulatory Visit: Payer: Medicare Other | Attending: Neurology | Admitting: Physical Therapy

## 2015-01-05 ENCOUNTER — Telehealth: Payer: Self-pay | Admitting: Neurology

## 2015-01-05 DIAGNOSIS — R258 Other abnormal involuntary movements: Secondary | ICD-10-CM | POA: Insufficient documentation

## 2015-01-05 DIAGNOSIS — R269 Unspecified abnormalities of gait and mobility: Secondary | ICD-10-CM | POA: Diagnosis not present

## 2015-01-05 DIAGNOSIS — R293 Abnormal posture: Secondary | ICD-10-CM | POA: Insufficient documentation

## 2015-01-05 DIAGNOSIS — R2681 Unsteadiness on feet: Secondary | ICD-10-CM | POA: Diagnosis not present

## 2015-01-05 NOTE — Telephone Encounter (Signed)
Rytary approved per Christella Scheuermann and prior auth faxed to [facility] at [phone] with confirmation received.

## 2015-01-06 NOTE — Therapy (Signed)
[facility] [address] [place], [place], [address] Phone: [phone]   Fax:  [phone]  Physical Therapy Evaluation  Patient Details  Name: [name] [name] MRN: [id number] Date of Birth: 1945/04/06 Referring Provider: Alonza Bogus, DO  Encounter Date: 01/05/2015      PT End of Session - 01/06/15 1309    Visit Number 1   Number of Visits 17   Date for PT Re-Evaluation 03/06/15   Authorization Type Medicare G-code every 10th visit   PT Start Time 1019   PT Stop Time 1103   PT Time Calculation (min) 44 min   Activity Tolerance Patient tolerated treatment well   Behavior During Therapy [facility] for tasks assessed/performed      Past Medical History  Diagnosis Date  . Thyroid disease     Hypothyroidism  . Hypercholesterolemia   . Hyperlipidemia   . Chronic insomnia   . Glaucoma   . Fibromyalgia   . Arthritis   . Hypothyroidism     s/p graves disease  . Sinus infection     at present- dr Honor Loh PA aware- was seen there and at PCP 05/10/11  . Peripheral vascular disease ([place])     PULMONARY EMBOLUS x 2- 2011, 2012/ FOLLOWED BY DR ODOGWU-LOV 12/12 EPIC   PT STAES WILL STOP COUMADIN 3/12 and begin LOVONOX 05/17/11 as previously instructed  . Complication of anesthesia     severe itching night of surgery requiring meds- also couldnt swallow- throat "paralyzed""  . Hypertension     PCP Dr Cari Caraway- [place]  05/15/11 with clearance and note on chart,    chest x ray, EKG 12/12 EPIC, eccho 7/11 EPIC  . Kidney stone   . PD (Parkinson's disease) [facility])     Past Surgical History  Procedure Laterality Date  . Abdominal hysterectomy    . Exploratory laparotomy    . Joint replacement      left knee  7/12  . Colonoscopy    . Cataract extraction Bilateral   . Appendectomy    . Cholecystectomy    . Total knee arthroplasty  05/22/2011    Procedure: TOTAL KNEE ARTHROPLASTY;  Surgeon: Mauri Pole, MD;  Location: [facility];   Service: Orthopedics;  Laterality: Right;    There were no vitals filed for this visit.  Visit Diagnosis:  Bradykinesia  Postural instability  Unsteadiness  Abnormality of gait      Subjective Assessment - 01/05/15 1022    Subjective Pt is a 69 year old female who presents to OP PT status post fall in February 2016.  She started on Sinemet shortly after that from Dr. [name]; however, that was causing increased pain all over.  She has had 5 falls since [place] where knee gave way; other falls, she just fell down.  She does not use assistive device.  She has a cane, that she doesn't use as often.  She has difficulty standing in one place too long.   Pertinent History History of bilateral TKR; fibromyalgia   Patient Stated Goals Pt's goal for therapy is to improve balance.   Currently in Pain? Yes  Pain to be monitored, not addressed by PT due to chronic in nature   Pain Score 4    Pain Location --  all over   Pain Orientation --  all over   Pain Descriptors / Indicators Aching;Cramping   Pain Onset More than a month ago   Pain Frequency Constant   Aggravating Factors  cold weather aggravates   Pain Relieving Factors Tylenol alleviates            [facility] PT Assessment - 01/05/15 1034    Assessment   Medical Diagnosis Multiple systems atrophy   Referring Provider Wells Guiles Tat, DO   Onset Date/Surgical Date --  February 2016   Precautions   Precautions Fall   Precaution Comments Pt reports falls have occurred to the left; left side weaker/more stiff; veers to L with gait at times per report   Balance Screen   Has the patient fallen in the past 6 months Yes   How many times? 5   Has the patient had a decrease in activity level because of a fear of falling?  Yes   Is the patient reluctant to leave their home because of a fear of falling?  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   Leisure Keeps grandchildren occasionally  does report difficulty getting up from the floor   Posture/Postural Control   Posture/Postural Control Postural limitations   Postural Limitations Rounded Shoulders;Forward head;Flexed trunk;Weight shift right  Camptocormia to the R   ROM / Strength   AROM / PROM / Strength Strength   Strength   Overall Strength Comments Grossly tested at least 4/5 bilateral lower extremities   Transfers   Transfers Sit to Stand;Stand to Sit   Sit to Stand 6: Modified independent (Device/Increase time);With upper extremity assist;Without upper extremity assist;From chair/3-in-1;5: Supervision  prefers UE support   Five time sit to stand comments  24.60  slight posterior lean upon attempts to stand   Stand to Sit 6: Modified independent (Device/Increase time);5: Supervision;With upper extremity assist;Without upper extremity assist;To chair/3-in-1   Stand to Sit Details slight uncontrolled descent into sitting   Ambulation/Gait   Ambulation/Gait Yes   Ambulation/Gait Assistance 6: Modified independent (Device/Increase time)   Ambulation Distance (Feet) 200 Feet   Assistive device None   Gait Pattern Step-through pattern;Decreased arm swing - right;Decreased arm swing - left;Decreased step length - right;Decreased step length - left;Lateral trunk lean to right;Trunk flexed;Poor foot clearance - left;Poor foot clearance - right   Ambulation Surface Indoor;Level   Gait velocity 17.73 sec = 1.85 ft/sec   Standardized Balance Assessment    Standardized Balance Assessment Timed Up and Go Test   Timed Up and Go Test   Normal TUG (seconds) 18.54   Manual TUG (seconds) 17.09   Cognitive TUG (seconds) 17.05   Functional Gait  Assessment   Gait assessed  Yes   Gait Level Surface Walks 20 ft, slow speed, abnormal gait pattern, evidence for imbalance or deviates 10-15 in outside of the 12 in walkway width. Requires more than 7 sec to ambulate 20 ft.  10.30   Change in Gait Speed Able to change speed, demonstrates mild gait deviations, deviates 6-10 in outside of the 12 in walkway width, or no gait deviations, unable to achieve a major change in velocity, or uses a change in  velocity, or uses an assistive device.   Gait with Horizontal Head Turns Performs head turns smoothly with slight change in gait velocity (eg, minor disruption to smooth gait path), deviates 6-10 in outside 12 in walkway width, or uses an assistive device.   Gait with Vertical Head Turns Performs task with slight change in gait velocity (eg, minor disruption to smooth gait path), deviates 6 - 10 in outside 12 in walkway width or uses assistive device   Gait and Pivot Turn Turns slowly, requires verbal cueing, or requires several small steps to catch balance following turn and stop  4.42   Step Over Obstacle Is able to step over one shoe box (4.5 in total height) but must slow down and adjust steps to clear box safely. May require verbal cueing.   Gait with Narrow Base of Support Ambulates less than 4 steps heel to toe or cannot perform without assistance.   Gait with Eyes Closed Walks 20 ft, slow speed, abnormal gait pattern, evidence for imbalance, deviates 10-15 in outside 12 in walkway width. Requires more than 9 sec to ambulate 20 ft.   Ambulating Backwards Walks 20 ft, slow speed, abnormal gait pattern, evidence for imbalance, deviates 10-15 in outside 12 in walkway width.  16.38   Steps Alternating feet, must use rail.   Total Score 13   FGA comment: Scores  <15/30 indicate increased fall risk in people with Parkinson's; <22/30 indicates increased fall risk in older adults                             PT Short Term Goals - 01/06/15 1408    PT SHORT TERM GOAL #1   Title Pt will be independent with HEP for improved functional mobility, transfers, posture, gait.  TARGET 02/04/15   Time 4   Period Weeks   Status New   PT SHORT TERM GOAL #2   Title Pt will improve 5x sit<>stand transfers to less than or equal to 20 seconds for improved efficiency and safety with transfers.   Time 4   Period Weeks   Status New   PT SHORT TERM GOAL #3   Title Pt will improve TUG score to less than or equal to 15 seconds for decreased fall risk.   Time 4   Period Weeks   Status New   PT SHORT TERM GOAL #4   Title Pt will verbalize understanding of local Parkinson's/MSA related resources.   Time 4   Period Weeks   Status New           PT Long Term Goals - 01/06/15 1410    PT LONG TERM GOAL #1   Title Pt will verbalize understanding of fall prevention within home environment.  TARGET  03/06/15   Time 8   Period Weeks   Status New   PT LONG TERM GOAL #2   Title Pt will improve 5x sit<>stand transfers to less than or equal to 15 seconds for improved efficiency and safety with transfers.   Time 8   Period Weeks   Status New   PT LONG TERM GOAL #3   Title Pt will improve TUG manual to less than or equal to 14.5 seconds for decreased fall risk/improved dual tasking.   Time 8   Period Weeks   Status New   PT LONG TERM GOAL #4   Title Pt will improve gait velocity to at least 2.3 ft/sec for imrpoved gait  efficiency and safety.   Time 8   Period Weeks   Status New   PT LONG TERM GOAL #5   Title Pt will improve Functional Gait Assessment to at least 18/30 for decreased fall risk.   Time 8   Period Weeks   Status New               Plan - 01/06/15 1310    Clinical Impression Statement Pt is a 69 year old female who  presents to OP PT with diagnosis of Parkinsonism>Multiple system atrophy.  Pt reports having at least 5 falls since February 2016.  She presents to OP PT with postural changes, postural instability, decreased timing and coordination of gait, bradykinesia, decreased functional strength, decreased balance.  Pt  presents as fall risk per gait velocity, TUG scores and Functional Gait Assessment scores.  Pt demonstrates difficulty with transfers with 5x sit<>stand test of 24.60 seconds.  She would benefit from skilled PT to address the above mentioned deficits for improved functional mobility and decreased fall risk.   Pt will benefit from skilled therapeutic intervention in order to improve on the following deficits Abnormal gait;Decreased balance;Decreased mobility;Decreased strength;Difficulty walking;Postural dysfunction   Rehab Potential Good   PT Frequency 2x / week   PT Duration 8 weeks  plus eval   PT Treatment/Interventions ADLs/Self Care Home Management;Therapeutic exercise;Therapeutic activities;Functional mobility training;Gait training;Balance training;Neuromuscular re-education;Patient/family education   PT Next Visit Plan Initiate HEP for posture, balance, gait (PWR! Moves versus balance strategy exercises); check push and release test   Consulted and Agree with Plan of Care Patient          G-Codes - 01/14/2015 1416    Functional Assessment Tool Used 5x sit<>stand:  24/60 seconds, TUG 18.54 seconds, TUG cognitive 17.09 sec, TUG manual 17.05 sec; Functional Gait Assessment 13/30; 5 falls since Feb.   Functional Limitation Mobility: Walking and moving around   Mobility: Walking and Moving Around Current Status [phone]) At least 40 percent but less than 60 percent impaired, limited or restricted   Mobility: Walking and Moving Around Goal Status [phone]) At least 20 percent but less than 40 percent impaired, limited or restricted       Problem List Patient Active Problem List   Diagnosis  Date Noted  . Hypercoagulable state ([place]) 02/07/2012  . Embolism ([place]) 02/07/2012  . S/P right TKA 05/22/2011    Rocket Gunderson W. 01/06/2015, 2:17 PM  Frazier Butt., PT  [facility] [address] [place], [place], [address] Phone: [phone]   Fax:  [phone]  Name: [name] [name] MRN: [id number] Date of Birth: 1945/05/22

## 2015-01-06 NOTE — Addendum Note (Signed)
Addended by: Frazier Butt on: 01/06/2015 02:21 PM   Modules accepted: Orders

## 2015-01-07 ENCOUNTER — Ambulatory Visit: Payer: Medicare Other | Admitting: Physical Therapy

## 2015-01-08 DIAGNOSIS — I2699 Other pulmonary embolism without acute cor pulmonale: Secondary | ICD-10-CM | POA: Diagnosis not present

## 2015-01-08 DIAGNOSIS — I10 Essential (primary) hypertension: Secondary | ICD-10-CM | POA: Diagnosis not present

## 2015-01-08 DIAGNOSIS — Z7901 Long term (current) use of anticoagulants: Secondary | ICD-10-CM | POA: Diagnosis not present

## 2015-01-08 DIAGNOSIS — E039 Hypothyroidism, unspecified: Secondary | ICD-10-CM | POA: Diagnosis not present

## 2015-01-08 DIAGNOSIS — M797 Fibromyalgia: Secondary | ICD-10-CM | POA: Diagnosis not present

## 2015-01-08 DIAGNOSIS — G459 Transient cerebral ischemic attack, unspecified: Secondary | ICD-10-CM | POA: Diagnosis not present

## 2015-01-08 DIAGNOSIS — E782 Mixed hyperlipidemia: Secondary | ICD-10-CM | POA: Diagnosis not present

## 2015-01-08 DIAGNOSIS — Z23 Encounter for immunization: Secondary | ICD-10-CM | POA: Diagnosis not present

## 2015-01-08 DIAGNOSIS — R7301 Impaired fasting glucose: Secondary | ICD-10-CM | POA: Diagnosis not present

## 2015-01-08 DIAGNOSIS — G4700 Insomnia, unspecified: Secondary | ICD-10-CM | POA: Diagnosis not present

## 2015-01-08 DIAGNOSIS — F339 Major depressive disorder, recurrent, unspecified: Secondary | ICD-10-CM | POA: Diagnosis not present

## 2015-01-11 ENCOUNTER — Ambulatory Visit: Payer: Medicare Other | Admitting: Physical Therapy

## 2015-01-11 DIAGNOSIS — R293 Abnormal posture: Secondary | ICD-10-CM | POA: Diagnosis not present

## 2015-01-11 DIAGNOSIS — R269 Unspecified abnormalities of gait and mobility: Secondary | ICD-10-CM | POA: Diagnosis not present

## 2015-01-11 DIAGNOSIS — R258 Other abnormal involuntary movements: Secondary | ICD-10-CM | POA: Diagnosis not present

## 2015-01-11 DIAGNOSIS — R2681 Unsteadiness on feet: Secondary | ICD-10-CM | POA: Diagnosis not present

## 2015-01-11 NOTE — Therapy (Signed)
[facility] [address] [place], [place], [address] Phone: [phone]   Fax:  [phone]  Physical Therapy Treatment  Patient Details  Name: [name] [name] MRN: [id number] Date of Birth: August 21, 1945 Referring Provider: Alonza Bogus, DO  Encounter Date: 01/11/2015      PT End of Session - 01/11/15 1111    Visit Number 2   Number of Visits 17   Date for PT Re-Evaluation 03/06/15   Authorization Type Medicare G-code every 10th visit   PT Start Time 1018   PT Stop Time 1102   PT Time Calculation (min) 44 min   Activity Tolerance Patient tolerated treatment well   Behavior During Therapy [facility] for tasks assessed/performed      Past Medical History  Diagnosis Date  . Thyroid disease     Hypothyroidism  . Hypercholesterolemia   . Hyperlipidemia   . Chronic insomnia   . Glaucoma   . Fibromyalgia   . Arthritis   . Hypothyroidism     s/p graves disease  . Sinus infection     at present- dr Honor Loh PA aware- was seen there and at PCP 05/10/11  . Peripheral vascular disease ([place])     PULMONARY EMBOLUS x 2- 2011, 2012/ FOLLOWED BY DR ODOGWU-LOV 12/12 EPIC   PT STAES WILL STOP COUMADIN 3/12 and begin LOVONOX 05/17/11 as previously instructed  . Complication of anesthesia     severe itching night of surgery requiring meds- also couldnt swallow- throat "paralyzed""  . Hypertension     PCP Dr Cari Caraway- [place]  05/15/11 with clearance and note on chart,    chest x ray, EKG 12/12 EPIC, eccho 7/11 EPIC  . Kidney stone   . PD (Parkinson's disease) [facility])     Past Surgical History  Procedure Laterality Date  . Abdominal hysterectomy    . Exploratory laparotomy    . Joint replacement      left knee  7/12  . Colonoscopy    . Cataract extraction Bilateral   . Appendectomy    . Cholecystectomy    . Total knee arthroplasty  05/22/2011    Procedure: TOTAL KNEE ARTHROPLASTY;  Surgeon: Mauri Pole, MD;  Location: [facility];   Service: Orthopedics;  Laterality: Right;    There were no vitals filed for this visit.  Visit Diagnosis:  Abnormality of gait      Subjective Assessment - 01/11/15 1027    Subjective Pt denies falls or changes.  Came in with cane today.   Pertinent History History of bilateral TKR; fibromyalgia   Patient Stated Goals Pt's goal for therapy is to improve balance.   Currently in Pain? Yes  fibromyalgia   Pain Score 3    Pain Location --  all over   Pain Descriptors / Indicators Aching   Pain Type Chronic pain   Pain Onset More than a month ago   Pain Frequency Constant   Aggravating Factors  cold weather   Pain Relieving Factors Tylenol      Performed Standing PWR! Moves basic 4 x 20 each with cues for intensity and posture.  Provided handout for home as well as resource on You tube.  Pt reports feeling as if L leg is longer than right.  States sometimes she thinks this causes her back pain on the left.  No significant difference when measured supine but slightly visible in standing(leans to R).  Discussed trying orthotic insert in R side to see if this helps  with back pain.   Posterior push and release test-Pt took >4 steps and needed assist to prevent fall  Anterior push and release test-Pt took 4 steps to recover balance         PT Education - 01/11/15 1110    Education provided Yes   Education Details Standing PWR!   Person(s) Educated Patient   Methods Explanation;Demonstration;Verbal cues;Handout;Other (comment)  you tube PWR! also as resource   Comprehension Verbalized understanding;Need further instruction          PT Short Term Goals - 01/06/15 1408    PT SHORT TERM GOAL #1   Title Pt will be independent with HEP for improved functional mobility, transfers, posture, gait.  TARGET 02/04/15   Time 4   Period Weeks   Status New   PT SHORT TERM GOAL #2   Title Pt will improve 5x sit<>stand transfers to less than or equal to 20 seconds for improved  efficiency and safety with transfers.   Time 4   Period Weeks   Status New   PT SHORT TERM GOAL #3   Title Pt will improve TUG score to less than or equal to 15 seconds for decreased fall risk.   Time 4   Period Weeks   Status New   PT SHORT TERM GOAL #4   Title Pt will verbalize understanding of local Parkinson's/MSA related resources.   Time 4   Period Weeks   Status New           PT Long Term Goals - 01/06/15 1410    PT LONG TERM GOAL #1   Title Pt will verbalize understanding of fall prevention within home environment.  TARGET  03/06/15   Time 8   Period Weeks   Status New   PT LONG TERM GOAL #2   Title Pt will improve 5x sit<>stand transfers to less than or equal to 15 seconds for improved efficiency and safety with transfers.   Time 8   Period Weeks   Status New   PT LONG TERM GOAL #3   Title Pt will improve TUG manual to less than or equal to 14.5 seconds for decreased fall risk/improved dual tasking.   Time 8   Period Weeks   Status New   PT LONG TERM GOAL #4   Title Pt will improve gait velocity to at least 2.3 ft/sec for imrpoved gait efficiency and safety.   Time 8   Period Weeks   Status New   PT LONG TERM GOAL #5   Title Pt will improve Functional Gait Assessment to at least 18/30 for decreased fall risk.   Time 8   Period Weeks   Status New               Plan - 01/11/15 1111    Clinical Impression Statement Pt needs cues for intensity and technique.  Poor reaction during push and pull test.  Continue PT per POC.   Pt will benefit from skilled therapeutic intervention in order to improve on the following deficits Abnormal gait;Decreased balance;Decreased mobility;Decreased strength;Difficulty walking;Postural dysfunction   Rehab Potential Good   PT Frequency 2x / week   PT Duration 8 weeks  plus eval   PT Treatment/Interventions ADLs/Self Care Home Management;Therapeutic exercise;Therapeutic activities;Functional mobility training;Gait  training;Balance training;Neuromuscular re-education;Patient/family education   PT Next Visit Plan Review standing PWR!, Door frame/wall stretch for posture, forward and backward step weight shifting for balance recovery, balance activites, gait    Consulted and Agree  with Plan of Care Patient        Problem List Patient Active Problem List   Diagnosis Date Noted  . Hypercoagulable state ([place]) 02/07/2012  . Embolism ([place]) 02/07/2012  . S/P right TKA 05/22/2011    Narda Bonds 01/11/2015, 11:17 AM  [facility] [address] [place], [place], [address] Phone: [phone]   Fax:  [phone]  Name: Ellinor Test MRN: [id number] Date of Birth: 12/30/1945    Narda Bonds, [place] 01/11/2015 11:17 AM Phone: [phone] Fax: [phone]

## 2015-01-11 NOTE — Patient Instructions (Signed)
Provided PWR! Moves in Standing at counter for support x 20 each of the basic 4 moves.

## 2015-01-13 ENCOUNTER — Ambulatory Visit: Payer: Medicare Other | Admitting: Physical Therapy

## 2015-01-13 DIAGNOSIS — R293 Abnormal posture: Secondary | ICD-10-CM | POA: Diagnosis not present

## 2015-01-13 DIAGNOSIS — R2681 Unsteadiness on feet: Secondary | ICD-10-CM | POA: Diagnosis not present

## 2015-01-13 DIAGNOSIS — R269 Unspecified abnormalities of gait and mobility: Secondary | ICD-10-CM

## 2015-01-13 DIAGNOSIS — R258 Other abnormal involuntary movements: Secondary | ICD-10-CM | POA: Diagnosis not present

## 2015-01-13 NOTE — Patient Instructions (Signed)
Weight Shift: Stepping Forward and Back    Hold counter for support.  Weight on left leg, step backward and transfer weight onto right leg, lifting toes of left foot. Then step forward and transfer weight onto right leg, lifting heel of left foot. Repeat 15 times each way. Then stand on right leg and move left leg.  Copyright  VHI. All rights reserved.

## 2015-01-13 NOTE — Therapy (Signed)
[facility] [address] [place], [place], [address] Phone: [phone]   Fax:  [phone]  Physical Therapy Treatment  Patient Details  Name: [name] [name] MRN: [id number] Date of Birth: Jun 29, 1945 Referring Provider: Alonza Bogus, DO  Encounter Date: 01/13/2015      PT End of Session - 01/13/15 1023    Visit Number 3   Number of Visits 17   Date for PT Re-Evaluation 03/06/15   Authorization Type Medicare G-code every 10th visit   PT Start Time 0804   PT Stop Time 0845   PT Time Calculation (min) 41 min   Activity Tolerance Patient tolerated treatment well;Other (comment)  tearful during session multiple times today   Behavior During Therapy [facility] for tasks assessed/performed      Past Medical History  Diagnosis Date  . Thyroid disease     Hypothyroidism  . Hypercholesterolemia   . Hyperlipidemia   . Chronic insomnia   . Glaucoma   . Fibromyalgia   . Arthritis   . Hypothyroidism     s/p graves disease  . Sinus infection     at present- dr Honor Loh PA aware- was seen there and at PCP 05/10/11  . Peripheral vascular disease ([place])     PULMONARY EMBOLUS x 2- 2011, 2012/ FOLLOWED BY DR ODOGWU-LOV 12/12 EPIC   PT STAES WILL STOP COUMADIN 3/12 and begin LOVONOX 05/17/11 as previously instructed  . Complication of anesthesia     severe itching night of surgery requiring meds- also couldnt swallow- throat "paralyzed""  . Hypertension     PCP Dr Cari Caraway- [place]  05/15/11 with clearance and note on chart,    chest x ray, EKG 12/12 EPIC, eccho 7/11 EPIC  . Kidney stone   . PD (Parkinson's disease) [facility])     Past Surgical History  Procedure Laterality Date  . Abdominal hysterectomy    . Exploratory laparotomy    . Joint replacement      left knee  7/12  . Colonoscopy    . Cataract extraction Bilateral   . Appendectomy    . Cholecystectomy    . Total knee arthroplasty  05/22/2011    Procedure: TOTAL KNEE  ARTHROPLASTY;  Surgeon: Mauri Pole, MD;  Location: [facility];  Service: Orthopedics;  Laterality: Right;    There were no vitals filed for this visit.  Visit Diagnosis:  Abnormality of gait      Subjective Assessment - 01/13/15 1018    Subjective Pt tearful upon coming into gym.  States she is depressed and feels like her children just dont understand what she going thru or help her out.  Stated that she is not interested in possibility of NeuroPsych referral at this time.  Pt reports she is having double vision this morning and thinks it is caused by the medication but then during treatment says it resolved and thinks maybe it is when her meds wear off that she is having double vision.   Pertinent History History of bilateral TKR; fibromyalgia   Patient Stated Goals Pt's goal for therapy is to improve balance.   Currently in Pain? Yes   Pain Score 3    Pain Location --  allover   Pain Orientation --  allover   Pain Descriptors / Indicators Aching   Pain Type Chronic pain   Pain Onset More than a month ago   Pain Frequency Constant   Aggravating Factors  cold weather    Pain Relieving Factors  tylenol      Standing PWR! Moves basic 4  X 20 with intermittent UE counter support and cues for intensity.   Forward, backward, forward/backward and side step weight shifting at counter with intermittent UE support and cues for intensity.           PT Education - 01/13/15 1020    Education provided Yes   Education Details PWR! Standing, Forward/backward step weight shifting, Neuropsychologist referal possibility, calling MD about reports of double vision and medication dosing/times   Person(s) Educated Patient   Methods Explanation;Demonstration;Verbal cues;Handout   Comprehension Verbalized understanding;Returned demonstration          PT Short Term Goals - 01/06/15 1408    PT SHORT TERM GOAL #1   Title Pt will be independent with HEP for improved functional mobility,  transfers, posture, gait.  TARGET 02/04/15   Time 4   Period Weeks   Status New   PT SHORT TERM GOAL #2   Title Pt will improve 5x sit<>stand transfers to less than or equal to 20 seconds for improved efficiency and safety with transfers.   Time 4   Period Weeks   Status New   PT SHORT TERM GOAL #3   Title Pt will improve TUG score to less than or equal to 15 seconds for decreased fall risk.   Time 4   Period Weeks   Status New   PT SHORT TERM GOAL #4   Title Pt will verbalize understanding of local Parkinson's/MSA related resources.   Time 4   Period Weeks   Status New           PT Long Term Goals - 01/06/15 1410    PT LONG TERM GOAL #1   Title Pt will verbalize understanding of fall prevention within home environment.  TARGET  03/06/15   Time 8   Period Weeks   Status New   PT LONG TERM GOAL #2   Title Pt will improve 5x sit<>stand transfers to less than or equal to 15 seconds for improved efficiency and safety with transfers.   Time 8   Period Weeks   Status New   PT LONG TERM GOAL #3   Title Pt will improve TUG manual to less than or equal to 14.5 seconds for decreased fall risk/improved dual tasking.   Time 8   Period Weeks   Status New   PT LONG TERM GOAL #4   Title Pt will improve gait velocity to at least 2.3 ft/sec for imrpoved gait efficiency and safety.   Time 8   Period Weeks   Status New   PT LONG TERM GOAL #5   Title Pt will improve Functional Gait Assessment to at least 18/30 for decreased fall risk.   Time 8   Period Weeks   Status New               Plan - 01/13/15 1024    Clinical Impression Statement Pt having double vision that resolved while in clinic and states she had it first thing this morning, took her meds and about 30 minutes later it resolved so she feels like it comes on when meds wear off.  Instructed pt to call Dr Tat and notifiy her of visual changes and timing of meds.  Pt states she is on antidepressant and is not  interesting in seeing a psychologist because "Unless they have [place] they wont understand what I'm going thru."  Offered to give pt information on Neuropsychologist  or arrange for support from another pt with MSA but pt declines at this time.   Pt will benefit from skilled therapeutic intervention in order to improve on the following deficits Abnormal gait;Decreased balance;Decreased mobility;Decreased strength;Difficulty walking;Postural dysfunction   Rehab Potential Good   PT Frequency 2x / week   PT Duration 8 weeks  plus eval   PT Treatment/Interventions ADLs/Self Care Home Management;Therapeutic exercise;Therapeutic activities;Functional mobility training;Gait training;Balance training;Neuromuscular re-education;Patient/family education   PT Next Visit Plan Door frame/wall stretch for posture, review forward and backward step weight shifting for balance recovery, balance activites, gait    Consulted and Agree with Plan of Care Patient        Problem List Patient Active Problem List   Diagnosis Date Noted  . Hypercoagulable state ([place]) 02/07/2012  . Embolism ([place]) 02/07/2012  . S/P right TKA 05/22/2011    Narda Bonds 01/13/2015, [facility] [address] [place], [place], [address] Phone: [phone]   Fax:  [phone]  Name: [name] [name] MRN: [id number] Date of Birth: 15-Oct-1945    Narda Bonds, [place] 01/13/2015 6:59 PM Phone: [phone] Fax: [phone]

## 2015-01-15 ENCOUNTER — Telehealth: Payer: Self-pay | Admitting: Neurology

## 2015-01-15 DIAGNOSIS — D1721 Benign lipomatous neoplasm of skin and subcutaneous tissue of right arm: Secondary | ICD-10-CM | POA: Diagnosis not present

## 2015-01-15 DIAGNOSIS — D1801 Hemangioma of skin and subcutaneous tissue: Secondary | ICD-10-CM | POA: Diagnosis not present

## 2015-01-15 DIAGNOSIS — L218 Other seborrheic dermatitis: Secondary | ICD-10-CM | POA: Diagnosis not present

## 2015-01-15 DIAGNOSIS — Z85828 Personal history of other malignant neoplasm of skin: Secondary | ICD-10-CM | POA: Diagnosis not present

## 2015-01-15 DIAGNOSIS — L821 Other seborrheic keratosis: Secondary | ICD-10-CM | POA: Diagnosis not present

## 2015-01-15 NOTE — Telephone Encounter (Signed)
PT called and is having a reaction to the medication Rytary/Dawn CB# [phone]

## 2015-01-15 NOTE — Telephone Encounter (Signed)
Spoke with patient. She will hold medication over the weekend and we will talk Monday.

## 2015-01-15 NOTE — Telephone Encounter (Signed)
Spoke with patient. She has been on Rytary 145 mg for about a month and complains of double vision and dizziness two days ago. I asked if any other medications, etc have changed lately since this would be an odd reaction after being on this for a month. She states that she has had dizziness and double vision that has been coming and going for a few weeks but usually gets better when she lays down. No symptoms on Rytary 95 mg. She would like to go back to Rytary 95 mg - 1 tablet three times daily. Please advise.

## 2015-01-15 NOTE — Telephone Encounter (Signed)
Double vision can be caused by the disease too.  Actually have her hold the medication all together for a few days (be careful as may be more stiff and fall risk) and see how it is.  Call her back on mon/tues

## 2015-01-18 ENCOUNTER — Telehealth: Payer: Self-pay | Admitting: Neurology

## 2015-01-18 MED ORDER — CARBIDOPA-LEVODOPA ER 23.75-95 MG PO CPCR: 1.0000 | ORAL_CAPSULE | Freq: Three times a day (TID) | ORAL | Status: DC

## 2015-01-18 NOTE — Telephone Encounter (Signed)
VM-PT left message to have Dr Doristine Devoid nurse call her back and did not say why/Dawn CB# [phone]

## 2015-01-18 NOTE — Telephone Encounter (Signed)
Patient made aware. RX sent to pharmacy.  

## 2015-01-18 NOTE — Telephone Encounter (Signed)
Pt called to request a refill of med/ Rytary/call back @ [phone]

## 2015-01-18 NOTE — Telephone Encounter (Signed)
Spoke with patient and she advised she has had no double vision since stopping Rytary. She had read that antidepressants can cause symptoms with Rytary and she wanted me to remind Dr Tat that she is taking Effexor. She would like to go back on low dose Rytary but would be okay with trying Carbidopa Levodopa again as well. Please advise.

## 2015-01-18 NOTE — Telephone Encounter (Signed)
Spoke with patient - she is asking for samples of Rytary until pharmacy gets them in. Samples at front for patient pick up.

## 2015-01-18 NOTE — Telephone Encounter (Signed)
I'm still not sure it was from med but she can try and go back to 95 mg tid and see how she does

## 2015-01-19 ENCOUNTER — Ambulatory Visit: Payer: Medicare Other | Admitting: Physical Therapy

## 2015-01-19 DIAGNOSIS — R2681 Unsteadiness on feet: Secondary | ICD-10-CM | POA: Diagnosis not present

## 2015-01-19 DIAGNOSIS — R293 Abnormal posture: Secondary | ICD-10-CM | POA: Diagnosis not present

## 2015-01-19 DIAGNOSIS — R258 Other abnormal involuntary movements: Secondary | ICD-10-CM | POA: Diagnosis not present

## 2015-01-19 DIAGNOSIS — R269 Unspecified abnormalities of gait and mobility: Secondary | ICD-10-CM | POA: Diagnosis not present

## 2015-01-19 NOTE — Patient Instructions (Signed)
Single Step: Side    Stand facing counter and hold to counter.  Lifting foot off floor, take one step slowly to left side. Return to starting position. Repeat _15___ times per session. Do __1-2__ sessions per day. Repeat to opposite side.  Copyright  VHI. All rights reserved.  Toe / Heel Raise    Stand in corner with chair in front of you for support.  Gently rock back on heels and raise toes. Then rock forward on toes and raise heels. Repeat sequence _15__ times per session. Do _1-2__ sessions per day.  Copyright  VHI. All rights reserved.  Weight Shift: Anterior / Posterior (Righting / Equilibrium)    Stand in a corner with chair in front for support.  Slowly shift weight forward, standing tall, until heels rise off floor. Return to starting position. Shift weight backward, to "bump" the wall, until toes rise off floor. Hold each position __1-2__ seconds. Repeat _15___ times per session. Do __1-2__ sessions per day.  Copyright  VHI. All rights reserved.

## 2015-01-20 ENCOUNTER — Ambulatory Visit: Payer: Medicare Other | Admitting: Physical Therapy

## 2015-01-20 NOTE — Therapy (Signed)
[facility] [address] [place], [place], [address] Phone: [phone]   Fax:  [phone]  Physical Therapy Treatment  Patient Details  Name: [name] [name] MRN: [id number] Date of Birth: 1945-11-25 Referring Provider: Alonza Bogus, DO  Encounter Date: 01/19/2015      PT End of Session - 01/20/15 0745    Visit Number 4   Number of Visits 17   Date for PT Re-Evaluation 03/06/15   Authorization Type Medicare G-code every 10th visit   PT Start Time 1018   PT Stop Time 1100   PT Time Calculation (min) 42 min   Activity Tolerance Patient tolerated treatment well   Behavior During Therapy [facility] for tasks assessed/performed      Past Medical History  Diagnosis Date  . Thyroid disease     Hypothyroidism  . Hypercholesterolemia   . Hyperlipidemia   . Chronic insomnia   . Glaucoma   . Fibromyalgia   . Arthritis   . Hypothyroidism     s/p graves disease  . Sinus infection     at present- dr Honor Loh PA aware- was seen there and at PCP 05/10/11  . Peripheral vascular disease ([place])     PULMONARY EMBOLUS x 2- 2011, 2012/ FOLLOWED BY DR ODOGWU-LOV 12/12 EPIC   PT STAES WILL STOP COUMADIN 3/12 and begin LOVONOX 05/17/11 as previously instructed  . Complication of anesthesia     severe itching night of surgery requiring meds- also couldnt swallow- throat "paralyzed""  . Hypertension     PCP Dr Cari Caraway- [place]  05/15/11 with clearance and note on chart,    chest x ray, EKG 12/12 EPIC, eccho 7/11 EPIC  . Kidney stone   . PD (Parkinson's disease) [facility])     Past Surgical History  Procedure Laterality Date  . Abdominal hysterectomy    . Exploratory laparotomy    . Joint replacement      left knee  7/12  . Colonoscopy    . Cataract extraction Bilateral   . Appendectomy    . Cholecystectomy    . Total knee arthroplasty  05/22/2011    Procedure: TOTAL KNEE ARTHROPLASTY;  Surgeon: Mauri Pole, MD;  Location: [facility];   Service: Orthopedics;  Laterality: Right;    There were no vitals filed for this visit.  Visit Diagnosis:  Bradykinesia  Postural instability  Unsteadiness      Subjective Assessment - 01/19/15 1023    Subjective Have talked to Dr. Doristine Devoid office and she plans to lower the Rytary dose due to c/o double vision and depressive episodes last week.  Pt reports feeling better today.   Currently in Pain? No/denies                         [facility] Adult PT Treatment/Exercise - 01/19/15 1043    Posture/Postural Control   Posture Comments Standing postural exercises in doorframe-tall as the wall posture, then scapular squeezes x 10, then neck retraction x 10 reps   High Level Balance   High Level Balance Comments Reviewed HEP given last visit-pt performs forward stepping and backward stepping at counter; side step and weigthshift x 10 reps with UE support at counter; heel/toe raises x 10 reps  Pt requires supervision &min verbal cues for technique    Pt reports she is fearful of some of her balance exercises.  Discussed options for optimal safety (performing exercises at sink for better grip/UE support, performing hip  and ankle strategies in corner with chair vs at counter).  Pt performs additional reps of the above exercises with supervision, for improved performance and reinforcement.            PT Education - 01/20/15 0743    Education provided Yes   Education Details Added to HEP:  side step and weightshift, hip/ankle strategy work   [employer]) Educated Patient   Methods Explanation;Demonstration;Verbal cues;Handout   Comprehension Verbalized understanding;Returned demonstration;Need further instruction;Verbal cues required          PT Short Term Goals - 01/06/15 1408    PT SHORT TERM GOAL #1   Title Pt will be independent with HEP for improved functional mobility, transfers, posture, gait.  TARGET 02/04/15   Time 4   Period Weeks   Status New   PT SHORT TERM  GOAL #2   Title Pt will improve 5x sit<>stand transfers to less than or equal to 20 seconds for improved efficiency and safety with transfers.   Time 4   Period Weeks   Status New   PT SHORT TERM GOAL #3   Title Pt will improve TUG score to less than or equal to 15 seconds for decreased fall risk.   Time 4   Period Weeks   Status New   PT SHORT TERM GOAL #4   Title Pt will verbalize understanding of local Parkinson's/MSA related resources.   Time 4   Period Weeks   Status New           PT Long Term Goals - 01/06/15 1410    PT LONG TERM GOAL #1   Title Pt will verbalize understanding of fall prevention within home environment.  TARGET  03/06/15   Time 8   Period Weeks   Status New   PT LONG TERM GOAL #2   Title Pt will improve 5x sit<>stand transfers to less than or equal to 15 seconds for improved efficiency and safety with transfers.   Time 8   Period Weeks   Status New   PT LONG TERM GOAL #3   Title Pt will improve TUG manual to less than or equal to 14.5 seconds for decreased fall risk/improved dual tasking.   Time 8   Period Weeks   Status New   PT LONG TERM GOAL #4   Title Pt will improve gait velocity to at least 2.3 ft/sec for imrpoved gait efficiency and safety.   Time 8   Period Weeks   Status New   PT LONG TERM GOAL #5   Title Pt will improve Functional Gait Assessment to at least 18/30 for decreased fall risk.   Time 8   Period Weeks   Status New               Plan - 01/20/15 0746    Clinical Impression Statement Pt feels that tearful episodes and double vision episodes have resolved, and Dr. [name] is aware of those issues.  Discussed safety with exercise performance, to avoid potential falls.  Reviewed and worked on hip, ankle, and step strategies for balance today.  Pt will continue to benefit from further skilled PT to address balance, posture and gait.   Pt will benefit from skilled therapeutic intervention in order to improve on the following  deficits Abnormal gait;Decreased balance;Decreased mobility;Decreased strength;Difficulty walking;Postural dysfunction   Rehab Potential Good   PT Frequency 2x / week   PT Duration 8 weeks  plus eval   PT Treatment/Interventions ADLs/Self Care Home Management;Therapeutic  exercise;Therapeutic activities;Functional mobility training;Gait training;Balance training;Neuromuscular re-education;Patient/family education   PT Next Visit Plan Door frame/wall stretch for posture, review HEP, balance activites, gait (Add PWR! Standing to HEP?)   Consulted and Agree with Plan of Care Patient        Problem List Patient Active Problem List   Diagnosis Date Noted  . Hypercoagulable state ([place]) 02/07/2012  . Embolism ([place]) 02/07/2012  . S/P right TKA 05/22/2011    Basel Defalco W. 01/20/2015, 7:49 AM Frazier Butt., PT [facility] [address] [place], [place], [address] Phone: [phone]   Fax:  [phone]  Name: [name] [name] MRN: [id number] Date of Birth: 1946-01-08

## 2015-01-21 ENCOUNTER — Ambulatory Visit: Payer: Medicare Other | Admitting: Physical Therapy

## 2015-01-21 DIAGNOSIS — Z7901 Long term (current) use of anticoagulants: Secondary | ICD-10-CM | POA: Diagnosis not present

## 2015-01-21 DIAGNOSIS — R269 Unspecified abnormalities of gait and mobility: Secondary | ICD-10-CM

## 2015-01-21 DIAGNOSIS — R293 Abnormal posture: Secondary | ICD-10-CM | POA: Diagnosis not present

## 2015-01-21 DIAGNOSIS — I2699 Other pulmonary embolism without acute cor pulmonale: Secondary | ICD-10-CM | POA: Diagnosis not present

## 2015-01-21 DIAGNOSIS — R2681 Unsteadiness on feet: Secondary | ICD-10-CM | POA: Diagnosis not present

## 2015-01-21 DIAGNOSIS — R258 Other abnormal involuntary movements: Secondary | ICD-10-CM | POA: Diagnosis not present

## 2015-01-21 NOTE — Patient Instructions (Signed)
Wall Posture Stretch  Start with backing both feet up to wall, then bottom, then shoulders then head.  Can put pillow behind head for a target and push head back against pillow. Do not tilt chin up-make sure and tuck chin in.  Can also do in seated position with chair against back of wall.    Hold each stretch for 60 seconds and repeat 2 times.  Do frequently during the day.

## 2015-01-21 NOTE — Therapy (Signed)
[facility] [address] [place], [place], [address] Phone: [phone]   Fax:  [phone]  Physical Therapy Treatment  Patient Details  Name: [name] [name] MRN: [id number] Date of Birth: [birth year] Referring Provider: Alonza Bogus, DO  Encounter Date: 01/21/2015      PT End of Session - 01/21/15 1337    Visit Number 5   Number of Visits 17   Date for PT Re-Evaluation 03/06/15   Authorization Type Medicare G-code every 10th visit   PT Start Time 1104   PT Stop Time 1150   PT Time Calculation (min) 46 min   Activity Tolerance Patient tolerated treatment well   Behavior During Therapy [facility] for tasks assessed/performed      Past Medical History  Diagnosis Date  . Thyroid disease     Hypothyroidism  . Hypercholesterolemia   . Hyperlipidemia   . Chronic insomnia   . Glaucoma   . Fibromyalgia   . Arthritis   . Hypothyroidism     s/p graves disease  . Sinus infection     at present- dr Honor Loh PA aware- was seen there and at PCP 05/10/11  . Peripheral vascular disease ([place])     PULMONARY EMBOLUS x 2- 2011, 2012/ FOLLOWED BY DR ODOGWU-LOV 12/12 EPIC   PT STAES WILL STOP COUMADIN 3/12 and begin LOVONOX 05/17/11 as previously instructed  . Complication of anesthesia     severe itching night of surgery requiring meds- also couldnt swallow- throat "paralyzed""  . Hypertension     PCP Dr Cari Caraway- [place]  05/15/11 with clearance and note on chart,    chest x ray, EKG 12/12 EPIC, eccho 7/11 EPIC  . Kidney stone   . PD (Parkinson's disease) [facility])     Past Surgical History  Procedure Laterality Date  . Abdominal hysterectomy    . Exploratory laparotomy    . Joint replacement      left knee  7/12  . Colonoscopy    . Cataract extraction Bilateral   . Appendectomy    . Cholecystectomy    . Total knee arthroplasty  05/22/2011    Procedure: TOTAL KNEE ARTHROPLASTY;  Surgeon: Mauri Pole, MD;  Location: [facility];   Service: Orthopedics;  Laterality: Right;    There were no vitals filed for this visit.  Visit Diagnosis:  Abnormality of gait  Postural instability      Subjective Assessment - 01/21/15 1109    Subjective States she felt good after using Scifit last visit.  Denies double vision since decreasing Rytary.   Pertinent History History of bilateral TKR; fibromyalgia   Patient Stated Goals Pt's goal for therapy is to improve balance.   Currently in Pain? No/denies             [facility] Adult PT Treatment/Exercise - 01/21/15 1116    Posture/Postural Control   Posture Comments Standing postural exercises in doorframe-tall as the wall posture, then neck retraction x 10 reps.  Also performed seated in chair against wall x 10 and with 60 second hold x 2.  Provided as HEP.   High Level Balance   High Level Balance Comments Pt performed heel/toe raises x 10 reps and wall bumps x 10-in corner with chair in front for UE support.  Side stepping along counter with intermittent UE support x 8' x 6 reps   Knee/Hip Exercises: Aerobic   Stationary Bike Scifit level 3.0 all 4 extremities x 8 minutes for endurance and flexibility;rpm>65  PWR [facility]) - 01/21/15 1334    PWR! exercises Moves in standing   PWR! Up 20   PWR! Rock 20   PWR! Twist 20   PWR Step 20   Comments In standing-Pt needs cues for intensity.  Had previously provided as HEP but pt reports she has not been doing this exercise at home.             PT Education - 01/21/15 1336    Education provided Yes   Education Details PWR! Standing, Wall posture exercise, scheduling additional appoints   Person(s) Educated Patient   Methods Explanation;Demonstration;Verbal cues;Handout   Comprehension Verbalized understanding;Returned demonstration;Need further instruction          PT Short Term Goals - 01/06/15 1408    PT SHORT TERM GOAL #1   Title Pt will be independent with HEP for improved functional mobility,  transfers, posture, gait.  TARGET 02/04/15   Time 4   Period Weeks   Status New   PT SHORT TERM GOAL #2   Title Pt will improve 5x sit<>stand transfers to less than or equal to 20 seconds for improved efficiency and safety with transfers.   Time 4   Period Weeks   Status New   PT SHORT TERM GOAL #3   Title Pt will improve TUG score to less than or equal to 15 seconds for decreased fall risk.   Time 4   Period Weeks   Status New   PT SHORT TERM GOAL #4   Title Pt will verbalize understanding of local Parkinson's/MSA related resources.   Time 4   Period Weeks   Status New           PT Long Term Goals - 01/06/15 1410    PT LONG TERM GOAL #1   Title Pt will verbalize understanding of fall prevention within home environment.  TARGET  03/06/15   Time 8   Period Weeks   Status New   PT LONG TERM GOAL #2   Title Pt will improve 5x sit<>stand transfers to less than or equal to 15 seconds for improved efficiency and safety with transfers.   Time 8   Period Weeks   Status New   PT LONG TERM GOAL #3   Title Pt will improve TUG manual to less than or equal to 14.5 seconds for decreased fall risk/improved dual tasking.   Time 8   Period Weeks   Status New   PT LONG TERM GOAL #4   Title Pt will improve gait velocity to at least 2.3 ft/sec for imrpoved gait efficiency and safety.   Time 8   Period Weeks   Status New   PT LONG TERM GOAL #5   Title Pt will improve Functional Gait Assessment to at least 18/30 for decreased fall risk.   Time 8   Period Weeks   Status New               Plan - 01/21/15 1337    Clinical Impression Statement Pt with brighter mood today and appears to have more energy.  Reports feeling better after decreasing meds per Dr Tat.  Continue PT per POC.   Pt will benefit from skilled therapeutic intervention in order to improve on the following deficits Abnormal gait;Decreased balance;Decreased mobility;Decreased strength;Difficulty walking;Postural  dysfunction   Rehab Potential Good   PT Frequency 2x / week   PT Duration 8 weeks  plus eval   PT Treatment/Interventions ADLs/Self Care Home Management;Therapeutic exercise;Therapeutic activities;Functional  mobility training;Gait training;Balance training;Neuromuscular re-education;Patient/family education   PT Next Visit Plan Door frame/wall stretch for posture, balance activites on compliant & non-compliant surfaces, gait (try treadmill?)   Consulted and Agree with Plan of Care Patient        Problem List Patient Active Problem List   Diagnosis Date Noted  . Hypercoagulable state ([place]) 02/07/2012  . Embolism ([place]) 02/07/2012  . S/P right TKA 05/22/2011    Narda Bonds 01/21/2015, 1:43 PM  [facility] [address] [place], [place], [address] Phone: [phone]   Fax:  [phone]  Name: Tamitra Bray MRN: [id number] Date of Birth: [birth year]    Narda Bonds, [place] 01/21/2015 1:43 PM Phone: [phone] Fax: [phone]

## 2015-01-25 ENCOUNTER — Ambulatory Visit: Payer: Medicare Other | Admitting: Physical Therapy

## 2015-01-26 ENCOUNTER — Ambulatory Visit: Payer: Medicare Other | Admitting: Physical Therapy

## 2015-02-02 ENCOUNTER — Ambulatory Visit: Payer: Medicare Other | Admitting: Physical Therapy

## 2015-02-02 DIAGNOSIS — R258 Other abnormal involuntary movements: Secondary | ICD-10-CM

## 2015-02-02 DIAGNOSIS — R269 Unspecified abnormalities of gait and mobility: Secondary | ICD-10-CM | POA: Diagnosis not present

## 2015-02-02 DIAGNOSIS — R293 Abnormal posture: Secondary | ICD-10-CM | POA: Diagnosis not present

## 2015-02-02 DIAGNOSIS — R2681 Unsteadiness on feet: Secondary | ICD-10-CM

## 2015-02-03 NOTE — Therapy (Signed)
[facility] [address] [place], [place], [address] Phone: [phone]   Fax:  [phone]  Physical Therapy Treatment  Patient Details  Name: [name] [name] MRN: [id number] Date of Birth: Sep 12, 1945 Referring Provider: Alonza Bogus, DO  Encounter Date: 02/02/2015      PT End of Session - 02/03/15 0956    Visit Number 6   Number of Visits 17   Date for PT Re-Evaluation 03/06/15   Authorization Type Medicare G-code every 10th visit   PT Start Time 1020   PT Stop Time 1100   PT Time Calculation (min) 40 min   Equipment Utilized During Treatment Gait belt   Activity Tolerance Patient tolerated treatment well   Behavior During Therapy [facility] for tasks assessed/performed      Past Medical History  Diagnosis Date  . Thyroid disease     Hypothyroidism  . Hypercholesterolemia   . Hyperlipidemia   . Chronic insomnia   . Glaucoma   . Fibromyalgia   . Arthritis   . Hypothyroidism     s/p graves disease  . Sinus infection     at present- dr Honor Loh PA aware- was seen there and at PCP 05/10/11  . Peripheral vascular disease ([place])     PULMONARY EMBOLUS x 2- 2011, 2012/ FOLLOWED BY DR ODOGWU-LOV 12/12 EPIC   PT STAES WILL STOP COUMADIN 3/12 and begin LOVONOX 05/17/11 as previously instructed  . Complication of anesthesia     severe itching night of surgery requiring meds- also couldnt swallow- throat "paralyzed""  . Hypertension     PCP Dr Cari Caraway- [place]  05/15/11 with clearance and note on chart,    chest x ray, EKG 12/12 EPIC, eccho 7/11 EPIC  . Kidney stone   . PD (Parkinson's disease) [facility])     Past Surgical History  Procedure Laterality Date  . Abdominal hysterectomy    . Exploratory laparotomy    . Joint replacement      left knee  7/12  . Colonoscopy    . Cataract extraction Bilateral   . Appendectomy    . Cholecystectomy    . Total knee arthroplasty  05/22/2011    Procedure: TOTAL KNEE ARTHROPLASTY;   Surgeon: Mauri Pole, MD;  Location: [facility];  Service: Orthopedics;  Laterality: Right;    There were no vitals filed for this visit.  Visit Diagnosis:  Abnormality of gait  Postural instability  Bradykinesia  Unsteadiness      Subjective Assessment - 02/02/15 1023    Subjective Feel off today-almost fell this morning trying to put my shoes on in standing.   Currently in Pain? No/denies                         [facility] Adult PT Treatment/Exercise - 02/02/15 1047    Transfers   Transfers Sit to Stand;Stand to Sit   Sit to Stand 6: Modified independent (Device/Increase time);Without upper extremity assist;From chair/3-in-1   Five time sit to stand comments  16.81  slight posterior lean with legs pushing on chair   Stand to Sit 6: Modified independent (Device/Increase time);Without upper extremity assist;To chair/3-in-1   Ambulation/Gait   Ambulation/Gait Yes   Ambulation/Gait Assistance 5: Supervision   Ambulation/Gait Assistance Details Used 4-wheeled RW today during session.  Pt has 4-wheeled RW at home and PT advised pt to begin using for safety, especially on days when she doesn't feel as steady.   Ambulation Distance (Feet)  500 Feet  , then110 ft x 2   Assistive device Straight cane;4-wheeled walker  110 ft x 2 cane; then 500 ft with rollator   Gait Pattern Step-through pattern;Decreased step length - right;Decreased step length - left;Poor foot clearance - left;Poor foot clearance - right   Ambulation Surface Level;Indoor   Knee/Hip Exercises: Aerobic   Stationary Bike Scifit level 3.0 all 4 extremities x 8 minutes for endurance and flexibility;rpm>65  No charge at end of session          Neuro Re-education:  Pt performs HEP exercises, consisting of counter exercises for weightshifting and stepping.  Pt able to perform with min cues for technique.  Reiterated to patient the importance of UE support at counter for safety, and if patient ultimately  feels off balance when attempting the exercises, she should not perform them.       PT Education - 02/03/15 0954    Education provided Yes   Education Details Self care:  including safety with ADLs (sitting vs. standing to put on shoes, socks), monitoring balance and "off-days" as when she needs to use rollator for safety; answered pt's questions regarding MSA-progression, likelihood of falls, highlight on safety   Person(s) Educated Patient   Methods Explanation   Comprehension Verbalized understanding          PT Short Term Goals - 02/03/15 0948    PT SHORT TERM GOAL #1   Title Pt will be independent with HEP for improved functional mobility, transfers, posture, gait.  TARGET 02/04/15   Time 4   Period Weeks   Status Achieved   PT SHORT TERM GOAL #2   Title Pt will improve 5x sit<>stand transfers to less than or equal to 20 seconds for improved efficiency and safety with transfers.   Baseline 16.81 sec with slight pushing with lower extremities on chair-02/02/15   Time 4   Period Weeks   Status Achieved   PT SHORT TERM GOAL #3   Title Pt will improve TUG score to less than or equal to 15 seconds for decreased fall risk.   Baseline TUG 16.73 sec 02/02/15   Time 4   Period Weeks   Status Not Met   PT SHORT TERM GOAL #4   Title Pt will verbalize understanding of local Parkinson's/MSA related resources.   Status On-going           PT Long Term Goals - 01/06/15 1410    PT LONG TERM GOAL #1   Title Pt will verbalize understanding of fall prevention within home environment.  TARGET  03/06/15   Time 8   Period Weeks   Status New   PT LONG TERM GOAL #2   Title Pt will improve 5x sit<>stand transfers to less than or equal to 15 seconds for improved efficiency and safety with transfers.   Time 8   Period Weeks   Status New   PT LONG TERM GOAL #3   Title Pt will improve TUG manual to less than or equal to 14.5 seconds for decreased fall risk/improved dual tasking.   Time  8   Period Weeks   Status New   PT LONG TERM GOAL #4   Title Pt will improve gait velocity to at least 2.3 ft/sec for imrpoved gait efficiency and safety.   Time 8   Period Weeks   Status New   PT LONG TERM GOAL #5   Title Pt will improve Functional Gait Assessment to at least 18/30 for decreased  fall risk.   Time 8   Period Weeks   Status New               Plan - 02/03/15 0956    Clinical Impression Statement Pt describes feeling more off balance today.  PT concerned regarding pt's intermittent feelings of being more off balance.  She appears and reports increased fearfulness with gait.  Had discussion regarding safety with gait and functional activities, need to use rollator walker as additional support for gait.  Pt has met STG #1, 2, not met #3.  STG #4 ongoing.   Pt will benefit from skilled therapeutic intervention in order to improve on the following deficits Abnormal gait;Decreased balance;Decreased mobility;Decreased strength;Difficulty walking;Postural dysfunction   Rehab Potential Good   PT Frequency 2x / week   PT Duration 8 weeks  plus eval   PT Treatment/Interventions ADLs/Self Care Home Management;Therapeutic exercise;Therapeutic activities;Functional mobility training;Gait training;Balance training;Neuromuscular re-education;Patient/family education   PT Next Visit Plan Review Door frame/wall stretch for posture, balance activites on compliant & non-compliant surfaces, gait training with rollator   Consulted and Agree with Plan of Care Patient        Problem List Patient Active Problem List   Diagnosis Date Noted  . Hypercoagulable state ([place]) 02/07/2012  . Embolism ([place]) 02/07/2012  . S/P right TKA 05/22/2011    Akhila Mahnken W. 02/03/2015, 10:02 AM  Frazier Butt., PT  [facility] [address] [place], [place], [address] Phone: [phone]   Fax:  [phone]  Name: [name] [name] MRN: [id number] Date of Birth: 1945-07-18

## 2015-02-05 ENCOUNTER — Ambulatory Visit: Payer: Medicare Other | Admitting: Physical Therapy

## 2015-02-09 ENCOUNTER — Ambulatory Visit: Payer: Medicare Other | Attending: Neurology | Admitting: Physical Therapy

## 2015-02-09 DIAGNOSIS — R258 Other abnormal involuntary movements: Secondary | ICD-10-CM | POA: Insufficient documentation

## 2015-02-09 DIAGNOSIS — R293 Abnormal posture: Secondary | ICD-10-CM | POA: Diagnosis not present

## 2015-02-09 DIAGNOSIS — R269 Unspecified abnormalities of gait and mobility: Secondary | ICD-10-CM | POA: Insufficient documentation

## 2015-02-09 DIAGNOSIS — R2681 Unsteadiness on feet: Secondary | ICD-10-CM | POA: Diagnosis not present

## 2015-02-09 NOTE — Patient Instructions (Signed)
Practice with turns:  1)  For turns when you have been standing at the counter or standing still for a while and want to change directions:  -Think about turning on the clock's numbers  -Step-together to 12:00, 3:00, 6:00, 9:00 with support at counter or walker  2)  For turns when you are walking using the walker and have a tight space to turn:  -Think about a marching turn  -March in place holding on to the walker, as you turn the walker to face the direction you need to go  3)  For turning to sit using the walker:  -Walk towards the chair where you will sit, and do a "U-turn" with the walker to "square up" to the chair  -Then you will want to take big steps backwards using the walker, until you feel the chair on the back of your legs  -Then reach back and sit

## 2015-02-10 NOTE — Therapy (Signed)
[facility] [address] [place], [place], [address] Phone: [phone]   Fax:  [phone]  Physical Therapy Treatment  Patient Details  Name: [name] [name] MRN: [id number] Date of Birth: 02/07/1946 Referring Provider: Rebecca Tat, DO  Encounter Date: 02/09/2015      PT End of Session - 02/10/15 1227    Visit Number 7   Number of Visits 17   Date for PT Re-Evaluation 03/06/15   Authorization Type Medicare G-code every 10th visit   PT Start Time 0936   PT Stop Time 1015   PT Time Calculation (min) 39 min   Equipment Utilized During Treatment Gait belt   Activity Tolerance Patient tolerated treatment well   Behavior During Therapy WFL for tasks assessed/performed      Past Medical History  Diagnosis Date  . Thyroid disease     Hypothyroidism  . Hypercholesterolemia   . Hyperlipidemia   . Chronic insomnia   . Glaucoma   . Fibromyalgia   . Arthritis   . Hypothyroidism     s/p graves disease  . Sinus infection     at present- dr Olins PA aware- was seen there and at PCP 05/10/11  . Peripheral vascular disease (HCC)     PULMONARY EMBOLUS x 2- 2011, 2012/ FOLLOWED BY DR ODOGWU-LOV 12/12 EPIC   PT STAES WILL STOP COUMADIN 3/12 and begin LOVONOX 05/17/11 as previously instructed  . Complication of anesthesia     severe itching night of surgery requiring meds- also couldnt swallow- throat "paralyzed""  . Hypertension     PCP Dr Wendy McNeill- LOV  05/15/11 with clearance and note on chart,    chest x ray, EKG 12/12 EPIC, eccho 7/11 EPIC  . Kidney stone   . PD (Parkinson's disease) (HCC)     Past Surgical History  Procedure Laterality Date  . Abdominal hysterectomy    . Exploratory laparotomy    . Joint replacement      left knee  7/12  . Colonoscopy    . Cataract extraction Bilateral   . Appendectomy    . Cholecystectomy    . Total knee arthroplasty  05/22/2011    Procedure: TOTAL KNEE ARTHROPLASTY;   Surgeon: Matthew D Olin, MD;  Location: [facility];  Service: Orthopedics;  Laterality: Right;    There were no vitals filed for this visit.  Visit Diagnosis:  Abnormality of gait  Unsteadiness      Subjective Assessment - 02/09/15 0939    Subjective Feel better today; had to cancel last visit due to coughing.  Feel much better today.  I am going to try to take the Sinemet later in the morning.   Currently in Pain? No/denies                         [facility] Adult PT Treatment/Exercise - 02/09/15 1000    Transfers   Comments Practiced U-turn to sit method with rollator walker, with cues for technique and supervision, multiple times throughout session.   Ambulation/Gait   Ambulation/Gait Yes   Ambulation/Gait Assistance 5: Supervision   Ambulation Distance (Feet) 720 Feet   Assistive device Rollator   Gait Pattern Step-through pattern;Decreased step length - right;Decreased step length - left;Poor foot clearance - left;Poor foot clearance - right   Ambulation Surface Level;Indoor   Gait Comments Practiced varied methods of turns using 4-wheeled RW   High Level Balance   High Level Balance Activities Turns;Marching turns     High Level Balance Comments At counter, practiced quarter turns for initial change of direction, then practiced marching turns in conjunction with wallking using rollator for improved change in directions; started controlled practice at counter, then transitioned to practice turning varied methods in gym area.   Exercises   Exercises --  Standing corner pec stretch 5 reps x 5 sec hold   Knee/Hip Exercises: Aerobic   Stationary Bike Scifit level 3.2 >level 3, all 4 extremities x 8 minutes for endurance and flexibility  cues for increased RPM to >70 with incr. in resistance                PT Education - 02/10/15 1227    Education provided Yes   Education Details Methods of turning with walker for improved safety with gait.   Person(s) Educated  Patient   Methods Explanation;Demonstration;Handout   Comprehension Verbalized understanding;Returned demonstration;Need further instruction          PT Short Term Goals - 02/03/15 0948    PT SHORT TERM GOAL #1   Title Pt will be independent with HEP for improved functional mobility, transfers, posture, gait.  TARGET 02/04/15   Time 4   Period Weeks   Status Achieved   PT SHORT TERM GOAL #2   Title Pt will improve 5x sit<>stand transfers to less than or equal to 20 seconds for improved efficiency and safety with transfers.   Baseline 16.81 sec with slight pushing with lower extremities on chair-02/02/15   Time 4   Period Weeks   Status Achieved   PT SHORT TERM GOAL #3   Title Pt will improve TUG score to less than or equal to 15 seconds for decreased fall risk.   Baseline TUG 16.73 sec 02/02/15   Time 4   Period Weeks   Status Not Met   PT SHORT TERM GOAL #4   Title Pt will verbalize understanding of local Parkinson's/MSA related resources.   Status On-going           PT Long Term Goals - 01/06/15 1410    PT LONG TERM GOAL #1   Title Pt will verbalize understanding of fall prevention within home environment.  TARGET  03/06/15   Time 8   Period Weeks   Status New   PT LONG TERM GOAL #2   Title Pt will improve 5x sit<>stand transfers to less than or equal to 15 seconds for improved efficiency and safety with transfers.   Time 8   Period Weeks   Status New   PT LONG TERM GOAL #3   Title Pt will improve TUG manual to less than or equal to 14.5 seconds for decreased fall risk/improved dual tasking.   Time 8   Period Weeks   Status New   PT LONG TERM GOAL #4   Title Pt will improve gait velocity to at least 2.3 ft/sec for imrpoved gait efficiency and safety.   Time 8   Period Weeks   Status New   PT LONG TERM GOAL #5   Title Pt will improve Functional Gait Assessment to at least 18/30 for decreased fall risk.   Time 8   Period Weeks   Status New                Plan - 02/10/15 1228    Clinical Impression Statement Pt receptive to instruction on turning practice today, using rollator in clinic.  Pt needs verbal cues to assist in selecting appropriate turning method based on different   activities.  Pt will continue to benefit from further skilled PT to address balance, gait and safety.   Pt will benefit from skilled therapeutic intervention in order to improve on the following deficits Abnormal gait;Decreased balance;Decreased mobility;Decreased strength;Difficulty walking;Postural dysfunction   Rehab Potential Good   PT Frequency 2x / week   PT Duration 8 weeks  plus eval   PT Treatment/Interventions ADLs/Self Care Home Management;Therapeutic exercise;Therapeutic activities;Functional mobility training;Gait training;Balance training;Neuromuscular re-education;Patient/family education   PT Next Visit Plan Review turning/gait practice with rollator; fall prevention, balance activites on compliant & non-compliant surfaces   Consulted and Agree with Plan of Care Patient        Problem List Patient Active Problem List   Diagnosis Date Noted  . Hypercoagulable state (HCC) 02/07/2012  . Embolism (HCC) 02/07/2012  . S/P right TKA 05/22/2011    , W. 02/10/2015, 12:32 PM  , W., PT [facility] [address] [place], [place], [address] Phone: [phone]   Fax:  [phone]  Name: [name] [name] MRN: [id number] Date of Birth: 06/02/1945     

## 2015-02-11 ENCOUNTER — Ambulatory Visit: Payer: Medicare Other | Admitting: Physical Therapy

## 2015-02-11 DIAGNOSIS — R269 Unspecified abnormalities of gait and mobility: Secondary | ICD-10-CM

## 2015-02-11 DIAGNOSIS — R2681 Unsteadiness on feet: Secondary | ICD-10-CM | POA: Diagnosis not present

## 2015-02-11 DIAGNOSIS — R293 Abnormal posture: Secondary | ICD-10-CM | POA: Diagnosis not present

## 2015-02-11 DIAGNOSIS — R258 Other abnormal involuntary movements: Secondary | ICD-10-CM | POA: Diagnosis not present

## 2015-02-11 NOTE — Therapy (Signed)
[facility] [address] [place], [place], [address] Phone: [phone]   Fax:  [phone]  Physical Therapy Treatment  Patient Details  Name: [name] [name] MRN: [id number] Date of Birth: 1945/04/06 Referring Provider: Alonza Bogus, DO  Encounter Date: 02/11/2015      PT End of Session - 02/11/15 1117    Visit Number 8   Number of Visits 17   Date for PT Re-Evaluation 03/06/15   Authorization Type Medicare G-code every 10th visit   PT Start Time 1021   PT Stop Time 1104   PT Time Calculation (min) 43 min   Equipment Utilized During Treatment Gait belt   Activity Tolerance Patient tolerated treatment well   Behavior During Therapy [facility] for tasks assessed/performed      Past Medical History  Diagnosis Date  . Thyroid disease     Hypothyroidism  . Hypercholesterolemia   . Hyperlipidemia   . Chronic insomnia   . Glaucoma   . Fibromyalgia   . Arthritis   . Hypothyroidism     s/p graves disease  . Sinus infection     at present- dr Honor Loh PA aware- was seen there and at PCP 05/10/11  . Peripheral vascular disease ([place])     PULMONARY EMBOLUS x 2- 2011, 2012/ FOLLOWED BY DR ODOGWU-LOV 12/12 EPIC   PT STAES WILL STOP COUMADIN 3/12 and begin LOVONOX 05/17/11 as previously instructed  . Complication of anesthesia     severe itching night of surgery requiring meds- also couldnt swallow- throat "paralyzed""  . Hypertension     PCP Dr Cari Caraway- [place]  05/15/11 with clearance and note on chart,    chest x ray, EKG 12/12 EPIC, eccho 7/11 EPIC  . Kidney stone   . PD (Parkinson's disease) [facility])     Past Surgical History  Procedure Laterality Date  . Abdominal hysterectomy    . Exploratory laparotomy    . Joint replacement      left knee  7/12  . Colonoscopy    . Cataract extraction Bilateral   . Appendectomy    . Cholecystectomy    . Total knee arthroplasty  05/22/2011    Procedure: TOTAL KNEE ARTHROPLASTY;   Surgeon: Mauri Pole, MD;  Location: [facility];  Service: Orthopedics;  Laterality: Right;    There were no vitals filed for this visit.  Visit Diagnosis:  Abnormality of gait      Subjective Assessment - 02/11/15 1032    Subjective Denies falls or changes since last visit.  Wants to get wheels to add to her standard walker to keep in the house and keep rollator in the car for going out although reports she is not using the cane consistently (only when coming to therapy).   Pertinent History History of bilateral TKR; fibromyalgia   Patient Stated Goals Pt's goal for therapy is to improve balance.   Currently in Pain? No/denies      Pt performed quarter round robin turns with and without rollator both directions with supervision only and verbal cues to review technique.  Gait with rollator x 800 plus feet working on turns and tight spaces.  Also gait in clinic without assistive device with supervision and slightly unsteady gait but no significant LOB.  Standing a counter for side stepping and backward walking with UE support.  Standing in parallel bars with intermittent UE support on foam balance beam, blue foam and rocker board both directions with head turns and head nods.  Standing in corner with chair in front of patient on pillow with eyes open and eyes closed, wide and narrow BOS and head turns and nods-provided as HEP. See HEP  Reviewed fall prevention-see patient instructions.         PT Education - 02/11/15 1116    Education provided Yes   Education Details Fall prevention;corner balance exercises   Person(s) Educated Patient   Methods Explanation;Handout;Demonstration   Comprehension Verbalized understanding;Returned demonstration          PT Short Term Goals - 02/03/15 0948    PT SHORT TERM GOAL #1   Title Pt will be independent with HEP for improved functional mobility, transfers, posture, gait.  TARGET 02/04/15   Time 4   Period Weeks   Status Achieved   PT  SHORT TERM GOAL #2   Title Pt will improve 5x sit<>stand transfers to less than or equal to 20 seconds for improved efficiency and safety with transfers.   Baseline 16.81 sec with slight pushing with lower extremities on chair-02/02/15   Time 4   Period Weeks   Status Achieved   PT SHORT TERM GOAL #3   Title Pt will improve TUG score to less than or equal to 15 seconds for decreased fall risk.   Baseline TUG 16.73 sec 02/02/15   Time 4   Period Weeks   Status Not Met   PT SHORT TERM GOAL #4   Title Pt will verbalize understanding of local Parkinson's/MSA related resources.   Status On-going           PT Long Term Goals - 01/06/15 1410    PT LONG TERM GOAL #1   Title Pt will verbalize understanding of fall prevention within home environment.  TARGET  03/06/15   Time 8   Period Weeks   Status New   PT LONG TERM GOAL #2   Title Pt will improve 5x sit<>stand transfers to less than or equal to 15 seconds for improved efficiency and safety with transfers.   Time 8   Period Weeks   Status New   PT LONG TERM GOAL #3   Title Pt will improve TUG manual to less than or equal to 14.5 seconds for decreased fall risk/improved dual tasking.   Time 8   Period Weeks   Status New   PT LONG TERM GOAL #4   Title Pt will improve gait velocity to at least 2.3 ft/sec for imrpoved gait efficiency and safety.   Time 8   Period Weeks   Status New   PT LONG TERM GOAL #5   Title Pt will improve Functional Gait Assessment to at least 18/30 for decreased fall risk.   Time 8   Period Weeks   Status New               Plan - 02/11/15 1118    Clinical Impression Statement Pt reports feeling like she can see an improvement in her mobility and posture.  Did well with turns today but unsteady with compliant surface balance activities.  Continue PT per POC.   Pt will benefit from skilled therapeutic intervention in order to improve on the following deficits Abnormal gait;Decreased  balance;Decreased mobility;Decreased strength;Difficulty walking;Postural dysfunction   Rehab Potential Good   PT Frequency 2x / week   PT Duration 8 weeks  plus eval   PT Treatment/Interventions ADLs/Self Care Home Management;Therapeutic exercise;Therapeutic activities;Functional mobility training;Gait training;Balance training;Neuromuscular re-education;Patient/family education   PT Next Visit Plan Review turning/gait practice with  rollator, balance activites on compliant & non-compliant surfaces   Consulted and Agree with Plan of Care Patient        Problem List Patient Active Problem List   Diagnosis Date Noted  . Hypercoagulable state ([place]) 02/07/2012  . Embolism ([place]) 02/07/2012  . S/P right TKA 05/22/2011    Narda Bonds 02/11/2015, 11:20 AM  [facility] [address] [place], [place], [address] Phone: [phone]   Fax:  [phone]  Name: [name] [name] MRN: [id number] Date of Birth: 1945-11-20    Narda Bonds, [place] 02/11/2015 11:20 AM Phone: [phone] Fax: [phone]

## 2015-02-11 NOTE — Patient Instructions (Addendum)
It is important to avoid accidents which may result in broken bones.  Here are a few ideas on how to make your home safer so you will be less likely to trip or fall.   Use nonskid mats or non slip strips in your shower or tub, on your bathroom floor and around sinks.  If you know that you have spilled water, wipe it up!  In the bathroom, it is important to have properly installed grab bars on the walls or on the edge of the tub.  Towel racks are NOT strong enough for you to hold onto or to pull on for support.  Stairs and hallways should have enough light.  Add lamps or night lights if you need ore light.  It is good to have handrails on both sides of the stairs if possible.  Always fix broken handrails right away.  It is important to see the edges of steps.  Paint the edges of outdoor steps white so you can see them better.  Put colored tape on the edge of inside steps.  Throw-rugs are dangerous because they can slide.  Removing the rugs is the best idea, but if they must stay, add adhesive carpet tape to prevent slipping.  Do not keep things on stairs or in the halls.  Remove small furniture that blocks the halls as it may cause you to trip.  Keep telephone and electrical cords out of the way where you walk.  Always were sturdy, rubber-soled shoes for good support.  Never wear just socks, especially on the stairs.  Socks may cause you to slip or fall.  Do not wear full-length housecoats as you can easily trip on the bottom.   Place the things you use the most on the shelves that are the easiest to reach.  If you use a stepstool, make sure it is in good condition.  If you feel unsteady, DO NOT climb, ask for help.  If a health professional advises you to use a cane or walker, do not be ashamed.  These items can keep you from falling and breaking your bones.   Fall Prevention in the Home  Falls can cause injuries and can affect people from all age groups. There are many simple things that  you can do to make your home safe and to help prevent falls. WHAT CAN I DO ON THE OUTSIDE OF MY HOME?  Regularly repair the edges of walkways and driveways and fix any cracks.  Remove high doorway thresholds.  Trim any shrubbery on the main path into your home.  Use bright outdoor lighting.  Clear walkways of debris and clutter, including tools and rocks.  Regularly check that handrails are securely fastened and in good repair. Both sides of any steps should have handrails.  Install guardrails along the edges of any raised decks or porches.  Have leaves, snow, and ice cleared regularly.  Use sand or salt on walkways during winter months.  In the garage, clean up any spills right away, including grease or oil spills. WHAT CAN I DO IN THE BATHROOM?  Use night lights.  Install grab bars by the toilet and in the tub and shower. Do not use towel bars as grab bars.  Use non-skid mats or decals on the floor of the tub or shower.  If you need to sit down while you are in the shower, use a plastic, non-slip stool.[place] [employer]  Keep the floor dry. Immediately clean up any water that spills on  the floor.  Remove soap buildup in the tub or shower on a regular basis.  Attach bath mats securely with double-sided non-slip rug tape.  Remove throw rugs and other tripping hazards from the floor. WHAT CAN I DO IN THE BEDROOM?  Use night lights.  Make sure that a bedside light is easy to reach.  Do not use oversized bedding that drapes onto the floor.  Have a firm chair that has side arms to use for getting dressed.  Remove throw rugs and other tripping hazards from the floor. WHAT CAN I DO IN THE KITCHEN?   Clean up any spills right away.  Avoid walking on wet floors.  Place frequently used items in easy-to-reach places.  If you need to reach for something above you, use a sturdy step stool that has a grab bar.  Keep electrical cables out of the way.  Do not use floor polish or wax  that makes floors slippery. If you have to use wax, make sure that it is non-skid floor wax.  Remove throw rugs and other tripping hazards from the floor. WHAT CAN I DO IN THE STAIRWAYS?  Do not leave any items on the stairs.  Make sure that there are handrails on both sides of the stairs. Fix handrails that are broken or loose. Make sure that handrails are as long as the stairways.  Check any carpeting to make sure that it is firmly attached to the stairs. Fix any carpet that is loose or worn.  Avoid having throw rugs at the top or bottom of stairways, or secure the rugs with carpet tape to prevent them from moving.  Make sure that you have a light switch at the top of the stairs and the bottom of the stairs. If you do not have them, have them installed. WHAT ARE SOME OTHER FALL PREVENTION TIPS?  Wear closed-toe shoes that fit well and support your feet. Wear shoes that have rubber soles or low heels.  When you use a stepladder, make sure that it is completely opened and that the sides are firmly locked. Have someone hold the ladder while you are using it. Do not climb a closed stepladder.  Add color or contrast paint or tape to grab bars and handrails in your home. Place contrasting color strips on the first and last steps.  Use mobility aids as needed, such as canes, walkers, scooters, and crutches.  Turn on lights if it is dark. Replace any light bulbs that burn out.  Set up furniture so that there are clear paths. Keep the furniture in the same spot.  Fix any uneven floor surfaces.  Choose a carpet design that does not hide the edge of steps of a stairway.  Be aware of any and all pets.  Review your medicines with your healthcare provider. Some medicines can cause dizziness or changes in blood pressure, which increase your risk of falling. Talk with your health care provider about other ways that you can decrease your risk of falls. This may include working with a physical  therapist or trainer to improve your strength, balance, and endurance.   This information is not intended to replace advice given to you by your health care provider. Make sure you discuss any questions you have with your health care provider.   Document Released: 02/10/2002 Document Revised: 07/07/2014 Document Reviewed: 03/27/2014 Elsevier Interactive Patient Education 2016 [place] (Compliant Surface) Head Motion - Eyes Closed    Stand on  compliant surface in a corner with chair in front on pillow with feet shoulder width apart. Close eyes and move head slowly, up and down. Repeat 10 times per session. Repeat with moving head side to side.  Repeat 10 times in this direction.  Do 2 sessions per day.    Copyright  VHI. All rights reserved.

## 2015-02-15 ENCOUNTER — Telehealth: Payer: Self-pay | Admitting: Neurology

## 2015-02-15 MED ORDER — CARBIDOPA-LEVODOPA ER 23.75-95 MG PO CPCR: 1.0000 | ORAL_CAPSULE | Freq: Three times a day (TID) | ORAL | Status: DC

## 2015-02-15 NOTE — Telephone Encounter (Signed)
Pt called for a refill of med sample(s)/call back @ [phone]

## 2015-02-15 NOTE — Telephone Encounter (Signed)
Patient wanted Rytary sent to another pharmacy. Rytary RX sent to [facility] in [place].

## 2015-02-24 ENCOUNTER — Ambulatory Visit: Payer: Medicare Other | Admitting: Physical Therapy

## 2015-02-24 DIAGNOSIS — R258 Other abnormal involuntary movements: Secondary | ICD-10-CM | POA: Diagnosis not present

## 2015-02-24 DIAGNOSIS — R269 Unspecified abnormalities of gait and mobility: Secondary | ICD-10-CM | POA: Diagnosis not present

## 2015-02-24 DIAGNOSIS — R293 Abnormal posture: Secondary | ICD-10-CM

## 2015-02-24 DIAGNOSIS — R2681 Unsteadiness on feet: Secondary | ICD-10-CM | POA: Diagnosis not present

## 2015-02-25 NOTE — Therapy (Signed)
[facility] [address] [place], [place], [address] Phone: [phone]   Fax:  [phone]  Physical Therapy Treatment  Patient Details  Name: [name] [name] MRN: [id number] Date of Birth: 02-Jul-1945 Referring Provider: Alonza Bogus, DO  Encounter Date: 02/24/2015      PT End of Session - 02/25/15 1304    Visit Number 9   Number of Visits 17   Date for PT Re-Evaluation 03/06/15   Authorization Type Medicare G-code every 10th visit   PT Start Time 1448   PT Stop Time 1530   PT Time Calculation (min) 42 min   Equipment Utilized During Treatment Gait belt   Activity Tolerance Patient tolerated treatment well   Behavior During Therapy [facility] for tasks assessed/performed      Past Medical History  Diagnosis Date  . Thyroid disease     Hypothyroidism  . Hypercholesterolemia   . Hyperlipidemia   . Chronic insomnia   . Glaucoma   . Fibromyalgia   . Arthritis   . Hypothyroidism     s/p graves disease  . Sinus infection     at present- dr Honor Loh PA aware- was seen there and at PCP 05/10/11  . Peripheral vascular disease ([place])     PULMONARY EMBOLUS x 2- 2011, 2012/ FOLLOWED BY DR ODOGWU-LOV 12/12 EPIC   PT STAES WILL STOP COUMADIN 3/12 and begin LOVONOX 05/17/11 as previously instructed  . Complication of anesthesia     severe itching night of surgery requiring meds- also couldnt swallow- throat "paralyzed""  . Hypertension     PCP Dr Cari Caraway- [place]  05/15/11 with clearance and note on chart,    chest x ray, EKG 12/12 EPIC, eccho 7/11 EPIC  . Kidney stone   . PD (Parkinson's disease) [facility])     Past Surgical History  Procedure Laterality Date  . Abdominal hysterectomy    . Exploratory laparotomy    . Joint replacement      left knee  7/12  . Colonoscopy    . Cataract extraction Bilateral   . Appendectomy    . Cholecystectomy    . Total knee arthroplasty  05/22/2011    Procedure: TOTAL KNEE ARTHROPLASTY;   Surgeon: Mauri Pole, MD;  Location: [facility];  Service: Orthopedics;  Laterality: Right;    There were no vitals filed for this visit.  Visit Diagnosis:  Postural instability  Abnormality of gait      Subjective Assessment - 02/24/15 1451    Subjective Fibromyalgia is really bothering me lately.  Between cold weather and helping my grandchildren more (with new baby being born), the fibromyalgia is really bothering me.   Currently in Pain? Yes   Pain Score 6    Pain Location --  all over   Pain Orientation --  all over   Pain Descriptors / Indicators Aching   Pain Type Chronic pain   Pain Onset More than a month ago   Pain Frequency Constant   Aggravating Factors  cold weather, over doing it   Pain Relieving Factors medications                         [facility] Adult PT Treatment/Exercise - 02/24/15 1503    Ambulation/Gait   Ambulation/Gait Yes   Ambulation/Gait Assistance 5: Supervision   Ambulation Distance (Feet) 500 Feet   Assistive device Rollator   Gait Pattern Step-through pattern;Decreased step length - right;Decreased step length - left;Poor  foot clearance - left;Poor foot clearance - right   Ambulation Surface Level;Indoor   High Level Balance   High Level Balance Comments At counter:  side stepping, forward/back walking with UE support, close supervision, 5 reps x length of counter.  Standing in corner on solid/foam surface:  head turns, head nods, marching in place, forward kicks with intermittent UE support.  Heel/toe raises x 10, then hip strategy wall bumps x 10 reps each.  At counter, practiced quarter turns, multiple reps with counter/chair support to L side, R side.  In parallel bars on rockerboard-ant/posterior weightshifting with UE support for hip/ankle strategy, head turns/nods, then alternating arm swing for improved dynamic balance, with min guard/supervision.   Knee/Hip Exercises: Aerobic   Stationary Bike SciFit, Level 2, 4 extremities x 8  minutes, >70-80 RPM      While pt on SciFit, pt educated on how she could use it as part of community fitness at community center.  Provided information on [facility] for gym use.            PT Education - 02/25/15 1309    Education provided Yes   Education Details Community fitness options-provided info on [employer] for exercise room   Person(s) Educated Patient   Methods Explanation;Handout   Comprehension Verbalized understanding          PT Short Term Goals - 02/03/15 0948    PT SHORT TERM GOAL #1   Title Pt will be independent with HEP for improved functional mobility, transfers, posture, gait.  TARGET 02/04/15   Time 4   Period Weeks   Status Achieved   PT SHORT TERM GOAL #2   Title Pt will improve 5x sit<>stand transfers to less than or equal to 20 seconds for improved efficiency and safety with transfers.   Baseline 16.81 sec with slight pushing with lower extremities on chair-02/02/15   Time 4   Period Weeks   Status Achieved   PT SHORT TERM GOAL #3   Title Pt will improve TUG score to less than or equal to 15 seconds for decreased fall risk.   Baseline TUG 16.73 sec 02/02/15   Time 4   Period Weeks   Status Not Met   PT SHORT TERM GOAL #4   Title Pt will verbalize understanding of local Parkinson's/MSA related resources.   Status On-going           PT Long Term Goals - 01/06/15 1410    PT LONG TERM GOAL #1   Title Pt will verbalize understanding of fall prevention within home environment.  TARGET  03/06/15   Time 8   Period Weeks   Status New   PT LONG TERM GOAL #2   Title Pt will improve 5x sit<>stand transfers to less than or equal to 15 seconds for improved efficiency and safety with transfers.   Time 8   Period Weeks   Status New   PT LONG TERM GOAL #3   Title Pt will improve TUG manual to less than or equal to 14.5 seconds for decreased fall risk/improved dual tasking.   Time 8   Period Weeks   Status New   PT  LONG TERM GOAL #4   Title Pt will improve gait velocity to at least 2.3 ft/sec for imrpoved gait efficiency and safety.   Time 8   Period Weeks   Status New   PT LONG TERM GOAL #5   Title Pt will improve Functional Gait Assessment to  at least 18/30 for decreased fall risk.   Time 8   Period Weeks   Status New               Plan - 02/25/15 1305    Clinical Impression Statement Pt has not been since 02/11/15 to PT due to new grandchild being born.  She appears to have improved steadiness with balance exercises today, but she still looks unsteady with only cane.  Pt would benfit from continued PT to address balance, strength and gait.   Pt will benefit from skilled therapeutic intervention in order to improve on the following deficits Abnormal gait;Decreased balance;Decreased mobility;Decreased strength;Difficulty walking;Postural dysfunction   Rehab Potential Good   PT Frequency 2x / week   PT Duration 8 weeks  plus eval   PT Treatment/Interventions ADLs/Self Care Home Management;Therapeutic exercise;Therapeutic activities;Functional mobility training;Gait training;Balance training;Neuromuscular re-education;Patient/family education   PT Next Visit Plan Continue turning/gait practice, review/discuss community fitness options, begin checking LTGs and plan for renew due to pt being out of therapy for family assistance.  GCODE next visit   Consulted and Agree with Plan of Care Patient        Problem List Patient Active Problem List   Diagnosis Date Noted  . Hypercoagulable state ([place]) 02/07/2012  . Embolism ([place]) 02/07/2012  . S/P right TKA 05/22/2011    Shannell Mikkelsen W. 02/25/2015, 1:14 PM Frazier Butt., PT [facility] [address] [place], [place], [address] Phone: [phone]   Fax:  [phone]  Name: [name] [name] MRN: [id number] Date of Birth: 05-17-1945

## 2015-03-03 ENCOUNTER — Ambulatory Visit: Payer: Medicare Other | Admitting: Physical Therapy

## 2015-03-05 ENCOUNTER — Ambulatory Visit: Payer: Medicare Other | Admitting: Physical Therapy

## 2015-03-05 DIAGNOSIS — R293 Abnormal posture: Secondary | ICD-10-CM

## 2015-03-05 DIAGNOSIS — R269 Unspecified abnormalities of gait and mobility: Secondary | ICD-10-CM | POA: Diagnosis not present

## 2015-03-05 DIAGNOSIS — R2681 Unsteadiness on feet: Secondary | ICD-10-CM | POA: Diagnosis not present

## 2015-03-05 DIAGNOSIS — R258 Other abnormal involuntary movements: Secondary | ICD-10-CM | POA: Diagnosis not present

## 2015-03-05 NOTE — Therapy (Signed)
[facility] [address] [place], [place], [address] Phone: [phone]   Fax:  [phone]  Physical Therapy Treatment  Patient Details  Name: [name] [name] MRN: [id number] Date of Birth: 04/16/1945 Referring Provider: Alonza Bogus, DO  Encounter Date: 03/05/2015      PT End of Session - 03/05/15 1251    Visit Number 10   Number of Visits 17   Date for PT Re-Evaluation 03/06/15  Renewal to be completed next visit   Authorization Type Medicare G-code every 10th visit   PT Start Time 0803   PT Stop Time 0848   PT Time Calculation (min) 45 min   Equipment Utilized During Treatment Gait belt   Activity Tolerance Patient tolerated treatment well   Behavior During Therapy [facility] for tasks assessed/performed      Past Medical History  Diagnosis Date  . Thyroid disease     Hypothyroidism  . Hypercholesterolemia   . Hyperlipidemia   . Chronic insomnia   . Glaucoma   . Fibromyalgia   . Arthritis   . Hypothyroidism     s/p graves disease  . Sinus infection     at present- dr Honor Loh PA aware- was seen there and at PCP 05/10/11  . Peripheral vascular disease ([place])     PULMONARY EMBOLUS x 2- 2011, 2012/ FOLLOWED BY DR ODOGWU-LOV 12/12 EPIC   PT STAES WILL STOP COUMADIN 3/12 and begin LOVONOX 05/17/11 as previously instructed  . Complication of anesthesia     severe itching night of surgery requiring meds- also couldnt swallow- throat "paralyzed""  . Hypertension     PCP Dr Cari Caraway- [place]  05/15/11 with clearance and note on chart,    chest x ray, EKG 12/12 EPIC, eccho 7/11 EPIC  . Kidney stone   . PD (Parkinson's disease) [facility])     Past Surgical History  Procedure Laterality Date  . Abdominal hysterectomy    . Exploratory laparotomy    . Joint replacement      left knee  7/12  . Colonoscopy    . Cataract extraction Bilateral   . Appendectomy    . Cholecystectomy    . Total knee arthroplasty  05/22/2011   Procedure: TOTAL KNEE ARTHROPLASTY;  Surgeon: Mauri Pole, MD;  [facility]: [facility];  Service: Orthopedics;  Laterality: Right;    There were no vitals filed for this visit.  Visit Diagnosis:  Postural instability  Abnormality of gait  Bradykinesia  Unsteadiness      Subjective Assessment - 03/05/15 0804    Subjective Had a good Christmas, but did have GI issues earlier this week.  Been doing my exercises.  Reports no falls.   Currently in Pain? Yes   Pain Score 1    Pain Location --  generalized fibromyalgia pain   Pain Orientation --   all over   Pain Descriptors / Indicators Aching   Pain Type Chronic pain   Pain Onset More than a month ago   Pain Frequency Constant   Aggravating Factors  over doing it   Pain Relieving Factors medications-some days are better than others            [facility] PT Assessment - 03/05/15 0824    Functional Gait  Assessment   Gait assessed  Yes   Gait Level Surface Walks 20 ft, slow speed, abnormal gait pattern, evidence for imbalance or deviates 10-15 in outside of the 12 in walkway width. Requires more than 7 sec  to ambulate 20 ft.   Change in Gait Speed Able to change speed, demonstrates mild gait deviations, deviates 6-10 in outside of the 12 in walkway width, or no gait deviations, unable to achieve a major change in velocity, or uses a change in velocity, or uses an assistive device.   Gait with Horizontal Head Turns Performs head turns smoothly with slight change in gait velocity (eg, minor disruption to smooth gait path), deviates 6-10 in outside 12 in walkway width, or uses an assistive device.   Gait with Vertical Head Turns Performs task with slight change in gait velocity (eg, minor disruption to smooth gait path), deviates 6 - 10 in outside 12 in walkway width or uses assistive device   Gait and Pivot Turn Pivot turns safely within 3 sec and stops quickly with no loss of balance.  2.43 sec   Step Over Obstacle Is able to step over  one shoe box (4.5 in total height) without changing gait speed. No evidence of imbalance.   Gait with Narrow Base of Support Ambulates less than 4 steps heel to toe or cannot perform without assistance.   Gait with Eyes Closed Walks 20 ft, slow speed, abnormal gait pattern, evidence for imbalance, deviates 10-15 in outside 12 in walkway width. Requires more than 9 sec to ambulate 20 ft.   Ambulating Backwards Walks 20 ft, uses assistive device, slower speed, mild gait deviations, deviates 6-10 in outside 12 in walkway width.   Steps Alternating feet, must use rail.   Total Score 17   FGA comment: Score improved from 13/30 last check                     [facility] Adult PT Treatment/Exercise - 03/05/15 0824    Transfers   Transfers Sit to Stand;Stand to Sit   Sit to Stand 6: Modified independent (Device/Increase time);Without upper extremity assist;From chair/3-in-1   Five time sit to stand comments  18.90  only slight lean onto chair with back of legs   Stand to Sit 6: Modified independent (Device/Increase time);Without upper extremity assist;To chair/3-in-1   Ambulation/Gait   Ambulation/Gait Yes   Ambulation/Gait Assistance 5: Supervision   Ambulation Distance (Feet) 300 Feet   Assistive device None   Gait Pattern Step-through pattern;Decreased step length - right;Decreased step length - left;Poor foot clearance - left;Poor foot clearance - right   Ambulation Surface Level;Indoor   Gait velocity 14.08 sec= 2.33 ft;sec   Gait Comments Pt presents to therapy session today with no device, stating she's moving better today.  Had discussion again regarding pt's fall risk, decreased balance and need for rollator for long distances, cane for short distances; pt states she may not be using cane consistently because she feels it is harder to coordinate.   Standardized Balance Assessment   Standardized Balance Assessment Timed Up and Go Test   Timed Up and Go Test   TUG Normal TUG;Manual  TUG   Normal TUG (seconds) 18.31   Manual TUG (seconds) 16.63   High Level Balance   High Level Balance Activities Turns;Figure 8 turns;Marching turns  wide U-turns, clock turn review-pt demo understanding   High Level Balance Comments Pt reports and demonstrates improved awareness of turning technique.   Knee/Hip Exercises: Aerobic   Stationary Bike SciFit, Level 2.5, 4 extremities x 5 minutes, for leg strengthening, cues for intensity >70 RPM      Transfers assessed as means of improved functional strength without use of hands-see above  Self Care:      PT Education - 03/05/15 1249    Education provided Yes   Education Details POC, review of fall prevention, review of turning techniques, pt still at fall risk-need for assistive device; pt in agreement with recert/renewal today for 4 additional weeks   Person(s) Educated Patient   Methods Explanation   Comprehension Verbalized understanding          PT Short Term Goals - 02/03/15 0948    PT SHORT TERM GOAL #1   Title Pt will be independent with HEP for improved functional mobility, transfers, posture, gait.  TARGET 02/04/15   Time 4   Period Weeks   Status Achieved   PT SHORT TERM GOAL #2   Title Pt will improve 5x sit<>stand transfers to less than or equal to 20 seconds for improved efficiency and safety with transfers.   Baseline 16.81 sec with slight pushing with lower extremities on chair-02/02/15   Time 4   Period Weeks   Status Achieved   PT SHORT TERM GOAL #3   Title Pt will improve TUG score to less than or equal to 15 seconds for decreased fall risk.   Baseline TUG 16.73 sec 02/02/15   Time 4   Period Weeks   Status Not Met   PT SHORT TERM GOAL #4   Title Pt will verbalize understanding of local Parkinson's/MSA related resources.   Status On-going           PT Long Term Goals - 03/05/15 0809    PT LONG TERM GOAL #1   Title Pt will verbalize understanding of fall prevention within home  environment.  TARGET  03/06/15   Time 8   Period Weeks   Status Achieved   PT LONG TERM GOAL #2   Title Pt will improve 5x sit<>stand transfers to less than or equal to 15 seconds for improved efficiency and safety with transfers.   Baseline 18.90 sec on 03/05/15   Time 8   Period Weeks   Status Not Met   PT LONG TERM GOAL #3   Title Pt will improve TUG manual to less than or equal to 14.5 seconds for decreased fall risk/improved dual tasking.   Baseline 16.63 sec   Time 8   Period Weeks   Status Not Met   PT LONG TERM GOAL #4   Title Pt will improve gait velocity to at least 2.3 ft/sec for imrpoved gait efficiency and safety.   Baseline 2.3 ft/sec   Time 8   Period Weeks   Status Achieved   PT LONG TERM GOAL #5   Title Pt will improve Functional Gait Assessment to at least 18/30 for decreased fall risk.   Baseline 17/30 03/05/15   Time 8   Period Weeks   Status Not Met               Plan - 03/05/15 1252    Clinical Impression Statement Pt has met LTG #1 and 4.  LTG #2, 3 and 5 not met, but pt has progressed towards them despite not meeting frequency for therapy this month (pt has cancelled several visits due to illness and was not able to make appts due to new grandchild being born/assisting family).  Pt is motivated for therapy and appears to be performing HEP at home.  While pt is improving with balance measures, pt continues to be at fall risk and would continue to benefit from further skilled PT to address balance and gait,  functional strength and transition to community fitness.    Pt will benefit from skilled therapeutic intervention in order to improve on the following deficits Abnormal gait;Decreased balance;Decreased mobility;Decreased strength;Difficulty walking;Postural dysfunction   Rehab Potential Good   PT Frequency 2x / week   PT Duration 8 weeks  plus eval   PT Treatment/Interventions ADLs/Self Care Home Management;Therapeutic exercise;Therapeutic  activities;Functional mobility training;Gait training;Balance training;Neuromuscular re-education;Patient/family education   PT Next Visit Plan RENEWAL/RECERT to be completed next visit with updated LTGs; discuss community fitness options, continue gait and balance, compliant surface activities   Consulted and Agree with Plan of Care Patient          G-Codes - 03-29-15 1257    Functional Assessment Tool Used 5x sit<>stand 18.90 seconds, TUG 18.31 sec, TUG manual 16.63 sec, FGA 17/30   Functional Limitation Mobility: Walking and moving around   Mobility: Walking and Moving Around Current Status [id number]) At least 20 percent but less than 40 percent impaired, limited or restricted   Mobility: Walking and Moving Around Goal Status [id number]) At least 20 percent but less than 40 percent impaired, limited or restricted      Problem List Patient Active Problem List   Diagnosis Date Noted  . Hypercoagulable state ([place]) 02/07/2012  . Embolism ([place]) 02/07/2012  . S/P right TKA 05/22/2011    Coley Kulikowski W. 29-Mar-2015, 12:59 PM  [facility] [address] [place], [place], [address] Phone: [phone]   Fax:  [phone]  Name: [name] [name] MRN: [id number] Date of Birth: Jul 18, 1945  Physical Therapy Progress Note  Dates of Reporting Period: 01/05/15 to 29-Mar-2015  Objective Reports of Subjective Statement: Pt has had only one near fall since beginning PT (5 falls since Feb. Prior to PT)  Objective Measurements: 5x sit<>stand:  18.9 sec, TUG 18.31 sec, TUG manual 16.63 sec, Functional Gait Assessment 17/30, gait velocity 2.33 ft/sec  Goal Update: See goals noted above-pt has progressed towards goals, but missed multiple appts this month due to sickness and family obligations.  Plan: Plan to continue additional 4 weeks, 2x/wk (renewal to be completed next session) for additional balance, gait, functional strengthening work.  Reason  Skilled Services are Required: Pt is improving with functional mobility, improving on functional measures, continues to be at fall risk.  Motivated for therapy.  Mady Haagensen, PT 03/29/2015 1:04 PM Phone: [phone] Fax: [phone]

## 2015-03-10 ENCOUNTER — Ambulatory Visit (INDEPENDENT_AMBULATORY_CARE_PROVIDER_SITE_OTHER): Payer: Medicare Other | Admitting: Neurology

## 2015-03-10 ENCOUNTER — Ambulatory Visit: Payer: Medicare Other | Attending: Neurology | Admitting: Physical Therapy

## 2015-03-10 ENCOUNTER — Encounter: Payer: Self-pay | Admitting: Neurology

## 2015-03-10 VITALS — BP 104/68 | HR 102 | Ht 66.5 in | Wt 204.0 lb

## 2015-03-10 DIAGNOSIS — R293 Abnormal posture: Secondary | ICD-10-CM | POA: Insufficient documentation

## 2015-03-10 DIAGNOSIS — R2681 Unsteadiness on feet: Secondary | ICD-10-CM | POA: Diagnosis not present

## 2015-03-10 DIAGNOSIS — G20 Parkinson's disease: Secondary | ICD-10-CM

## 2015-03-10 DIAGNOSIS — R269 Unspecified abnormalities of gait and mobility: Secondary | ICD-10-CM | POA: Diagnosis not present

## 2015-03-10 DIAGNOSIS — R258 Other abnormal involuntary movements: Secondary | ICD-10-CM | POA: Diagnosis not present

## 2015-03-10 DIAGNOSIS — M797 Fibromyalgia: Secondary | ICD-10-CM

## 2015-03-10 MED ORDER — CARBIDOPA-LEVODOPA ER 23.75-95 MG PO CPCR: 1.0000 | ORAL_CAPSULE | Freq: Three times a day (TID) | ORAL | Status: DC

## 2015-03-10 NOTE — Patient Instructions (Signed)

## 2015-03-10 NOTE — Progress Notes (Signed)
[name] [name] was seen today in the movement disorders clinic for neurologic consultation at the request of MCNEILL,WENDY, MD.   This patient is accompanied in the office by her child who supplements the history.   Prior records made available to me were reviewed.  The patient first saw Dr. [name] in March, 2016 at which point she was complaining about symptoms of diplopia and weakness.  Because of the diplopia she had acetylcholine receptor antibodies that were performed that were negative.  She has had an MRI and MRA of the brain with and without gadolinium.  I reviewed these images.  These were done on 05/21/2014.  This just revealed mild small vessel disease.  She also had a carotid ultrasound with him in March, 2016 revealed less than 50% stenosis bilaterally.  She followed up with him on 07/23/2014 and started on carbidopa/levodopa 25/100, one tablet 3 times per day (8am, 12pm, 5pm, before the meals).  She called back to him and complained about diffuse psain, headaches, depression and nausea.  Although he was not completely convinced that some of these things were not associated with her fibromyalgia and situational depression (her grandson was leaving for college and he serves as a caregiver), it was decided to taper off of her levodopa on 09/16/2014.   She did this and is now off the medication.  She states today that she initially felt great on the medication but then felt that it caused side effects.  Now that she is off of it, however, she c/o difficulty with walking. They also discussed referrals to pain management and psychiatry.  She presents today for a second opinion.  Pt states that diplopia was her first sx and then she began to notice falling.  This started in 04/2013.  The diplopia was horizontal in nature and she initially thought that it was due to lyrica.  The lyrica was d/c and it initally seemed to get better but then it came back.  It gets better if she closes an eye (doesn't  know if it matters which eye she closes).  She has some degree of diplopia daily but mostly at night.  She sees Dr. [name] at [facility] and was told that nothing was wrong with the eyes.    10/29/14 update:  The patient is following up today.  Overall, she states that she is feeling well.  She really hasn't had significant diplopia since our last visit.  She had her DaT scan on 10/14/2014 and while tracer uptake was present in the bilateral attainment, it was decreased on the right putamen relative to the right caudate, which can be seen in early parkinsonian syndromes.  The patient admits that she is still very slow.  She saw a different ophthalmologist [facility] imaging since our last visit and states that the diagnosis of glaucoma was retracted and everything looked well.  She denies any falls since our last visit no hallucinations.  12/10/14 update:  The patient is accompanied by her daughter who supplements the history.  I started the patient on carbidopa/levodopa 25/100 tid last visit.  Today, she states that she thinks that the medication makes her fibromyalgia worse but admits that it could be the weather.  Admits to a fall yesterday.   She was going into the house and there is an odd shaped stair (L shaped landing) and she fell backwards.   She called 911 and she went to [facility] and she had a CT brain and she states that it  was negative.  Had her INR checked and it was 2.6.  Does state that she was vomiting on way on the way to the hospital so they kept her for a few hours.  Still nauseated some today but has been nauseated even prior to that and wonders if the medication contributes.  She did not take any carbidopa/levodopa today.   03/09/14 update:  The patient follows up today.  Last visit, she complained about multiple side effects of levodopa including the fact that she thought that it made her fibromyalgia worse.  Although I told her that I was not convinced that it caused many of these side  effects, we ultimately decided to try rytary instead.  She started with 95 mg 3 times a day and worked up to 145 mg 3 times a day.  She called me on October 20 to state that she was doing well and then called back on November 11 to state that the medication was causing dizziness and diplopia.  She wanted to decrease the medication.  We explained that sometimes the disease can cause the symptoms and so I had her hold the medication for a few days and she stated that the dizziness and diplopia resolved.  She asked again to go back down to the 95 mg 3 times a day, which we ultimately did.  She feels well going to PT.  She is not exercising otherwise.  She asks me for a referral to pain management for her fibromyalgia.  She has not had falls.  No hallucinations.    PREVIOUS MEDICATIONS: Sinemet  ALLERGIES:   Allergies  Allergen Reactions  . Crestor [Rosuvastatin Calcium] Other (See Comments)    Feeling very bad, aching all over.  . Erythromycin Hives  . Pregabalin Other (See Comments)    Crossed vision.  Marland Kitchen Macrobid [Nitrofurantoin] Hives  . Carbidopa     Headaches, nausea, dizziness  . Losartan     CURRENT MEDICATIONS:  Outpatient Encounter Prescriptions as of 03/10/2015  Medication Sig  . acetaminophen (TYLENOL) 325 MG tablet Take 650 mg by mouth every 6 (six) hours as needed.  Marland Kitchen aspirin 81 MG tablet Take 81 mg by mouth daily after breakfast.   . azelastine (ASTELIN) 0.1 % nasal spray Place 1 spray into both nostrils 2 (two) times daily. Use in each nostril as directed  . B Complex-C (SUPER B COMPLEX PO) Take 1 tablet by mouth at bedtime.   . Calcium Carbonate-Vitamin D (CALCIUM + D PO) Take 2 tablets by mouth at bedtime. 600-200 mg tab.  . Carbidopa-Levodopa ER (RYTARY) 23.75-95 MG CPCR Take 1 tablet by mouth 3 (three) times daily.  . Cholecalciferol (VITAMIN D3) 5000 UNITS CAPS Take 5,000 Units by mouth daily.   . Magnesium 250 MG TABS Take 1 tablet by mouth at bedtime.   . Multiple Vitamin  (MULTIVITAMIN) tablet Take 1 tablet by mouth daily.   . Omega-3 Fatty Acids (FISH OIL) 1200 MG CAPS Take 2 capsules by mouth at bedtime.   Marland Kitchen SYNTHROID 150 MCG tablet Take 150 mcg by mouth daily before breakfast.   . telmisartan-hydrochlorothiazide (MICARDIS HCT) 40-12.5 MG per tablet Take 1 tablet by mouth daily.   Marland Kitchen venlafaxine XR (EFFEXOR-XR) 75 MG 24 hr capsule Take 75 mg by mouth daily with breakfast.   . warfarin (COUMADIN) 5 MG tablet Take 5 mg by mouth daily. 1 tab Monday, Wednesday, Friday and Saturday. 1/2 tab Tuesday, Thursday and Sunday  . zolpidem (AMBIEN) 10 MG tablet Take 10  mg by mouth at bedtime.   . [DISCONTINUED] Carbidopa-Levodopa ER (RYTARY) 23.75-95 MG CPCR Take 1 tablet by mouth 3 (three) times daily.  . [DISCONTINUED] Carbidopa-Levodopa ER (RYTARY) 23.75-95 MG CPCR Take 1 tablet by mouth 3 (three) times daily.  . [DISCONTINUED] ondansetron (ZOFRAN-ODT) 4 MG disintegrating tablet Take 4 mg by mouth.   No facility-administered encounter medications on file as of 03/10/2015.    PAST MEDICAL HISTORY:   Past Medical History  Diagnosis Date  . Thyroid disease     Hypothyroidism  . Hypercholesterolemia   . Hyperlipidemia   . Chronic insomnia   . Glaucoma   . Fibromyalgia   . Arthritis   . Hypothyroidism     s/p graves disease  . Sinus infection     at present- dr Honor Loh PA aware- was seen there and at PCP 05/10/11  . Peripheral vascular disease ([place])     PULMONARY EMBOLUS x 2- 2011, 2012/ FOLLOWED BY DR ODOGWU-LOV 12/12 EPIC   PT STAES WILL STOP COUMADIN 3/12 and begin LOVONOX 05/17/11 as previously instructed  . Complication of anesthesia     severe itching night of surgery requiring meds- also couldnt swallow- throat "paralyzed""  . Hypertension     PCP Dr Cari Caraway- [place]  05/15/11 with clearance and note on chart,    chest x ray, EKG 12/12 EPIC, eccho 7/11 EPIC  . Kidney stone   . PD (Parkinson's disease) ([place])     PAST SURGICAL HISTORY:   Past Surgical History    Procedure Laterality Date  . Abdominal hysterectomy    . Exploratory laparotomy    . Joint replacement      left knee  7/12  . Colonoscopy    . Cataract extraction Bilateral   . Appendectomy    . Cholecystectomy    . Total knee arthroplasty  05/22/2011    Procedure: TOTAL KNEE ARTHROPLASTY;  Surgeon: Mauri Pole, MD;  Location: [facility];  Service: Orthopedics;  Laterality: Right;    SOCIAL HISTORY:   Social History   Social History  . Marital Status: Widowed    Spouse Name: N/A  . Number of Children: 2  . Years of Education: N/A   Occupational History  . Not on file.   Social History Main Topics  . Smoking status: Former Smoker -- 1.00 packs/day    Quit date: 05/10/1989  . Smokeless tobacco: Never Used  . Alcohol Use: 0.0 oz/week    0 Standard drinks or equivalent per week     Comment: one drink once a month  . Drug Use: No  . Sexual Activity: Not on file   Other Topics Concern  . Not on file   Social History Narrative   Lives at home alone   Drinks caffeine occasionally     FAMILY HISTORY:   Family Status  Relation Status Death Age  . Mother Deceased 36    peritonitis  . Father Deceased 69    stroke, prostate cancer  . Sister Alive     10 siblings - 1 cerebral palsy  . Brother Alive     ROS:  A complete 10 system review of systems was obtained and was unremarkable apart from what is mentioned above.  PHYSICAL EXAMINATION:    VITALS:   Filed Vitals:   03/10/15 0935  BP: 104/68  Pulse: 102  Height: 5' 6.5" (1.689 m)  Weight: 204 lb (92.534 kg)    GEN:  The patient appears stated age and is in  NAD. HEENT:  Normocephalic, atraumatic.  CV:  RRR Lungs:  CTAB.   Neck/HEME:  There are no carotid bruits bilaterally.  There is decreased range of motion of the neck.  She has difficulty turning the head to the right.  Neurological examination:  Orientation: The patient is alert and oriented x3.  Cranial nerves: There is good facial symmetry.  There  is facial hypomimia.   The speech is fluent and clear.  There are no square wave jerks.  She has no difficulty with the guttural sounds.  Soft palate rises symmetrically and there is no tongue deviation. Hearing is intact to conversational tone. Sensation: Sensation is intact to light touch throughout. Motor: Strength is 5/5 in the bilateral upper and lower extremities.   Shoulder shrug is equal and symmetric.  There is no pronator drift.  There are no fasciculations noted.   Movement examination: Tone: There is normal tone in the bilateral upper extremities.  The tone in the lower extremities is normal.  Abnormal movements: None, even with distraction procedures Coordination:  There is minimal decreased toe taps on the L Gait and Station: The patient has no difficulty arising out of a deep-seated chair without the use of the hands.  Stride length is good today.   ASSESSMENT/PLAN:  1.  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  Her DaT scan demonstrated decreased uptake of the tracer in the right putamen, but it was very mild.   I am not convinced that everything that she reports as "side effects" of levodopa are really side effects.  She thought that it made her fibromyalgia worse.  She has also thought that rytary 145 mg tid caused diplopia and I also am not convinced that this is from this but is doing well on 95 mg tid and we will leave her on this dose.  She was given form for pt assistance as this has been very expensive for her.  She was also given samples today.  Risks, benefits, side effects and alternative therapies were discussed.  The opportunity to ask questions was given and they were answered to the best of my ability.  The patient expressed understanding and willingness to follow the outlined treatment protocols.   -She will continue with her PT.  Encouraged her to start  exercise outside of PT 2.  Fibromyalgia  -she asked me for referral to pain management and will provide 3.  .Much greater than 50% of this visit was spent in counseling with the patient.  Total face to face time:  25 min

## 2015-03-10 NOTE — Therapy (Signed)
[facility] [address] [place], [place], [address] Phone: [phone]   Fax:  [phone]  Physical Therapy Treatment  Patient Details  Name: [name] [name] MRN: [id number] Date of Birth: 06-Sep-1945 Referring Provider: Alonza Bogus, DO  Encounter Date: 03/10/2015      PT End of Session - 03/10/15 1159    Visit Number 11   Number of Visits 18   Date for PT Re-Evaluation 05/06/15   Authorization Type Medicare G-code every 10th visit   PT Start Time 1103   PT Stop Time 1143   PT Time Calculation (min) 40 min   Equipment Utilized During Treatment Gait belt   Activity Tolerance Patient tolerated treatment well   Behavior During Therapy [facility] for tasks assessed/performed      Past Medical History  Diagnosis Date  . Thyroid disease     Hypothyroidism  . Hypercholesterolemia   . Hyperlipidemia   . Chronic insomnia   . Glaucoma   . Fibromyalgia   . Arthritis   . Hypothyroidism     s/p graves disease  . Sinus infection     at present- dr Honor Loh PA aware- was seen there and at PCP 05/10/11  . Peripheral vascular disease ([place])     PULMONARY EMBOLUS x 2- 2011, 2012/ FOLLOWED BY DR ODOGWU-LOV 12/12 EPIC   PT STAES WILL STOP COUMADIN 3/12 and begin LOVONOX 05/17/11 as previously instructed  . Complication of anesthesia     severe itching night of surgery requiring meds- also couldnt swallow- throat "paralyzed""  . Hypertension     PCP Dr Cari Caraway- [place]  05/15/11 with clearance and note on chart,    chest x ray, EKG 12/12 EPIC, eccho 7/11 EPIC  . Kidney stone   . PD (Parkinson's disease) [facility])     Past Surgical History  Procedure Laterality Date  . Abdominal hysterectomy    . Exploratory laparotomy    . Joint replacement      left knee  7/12  . Colonoscopy    . Cataract extraction Bilateral   . Appendectomy    . Cholecystectomy    . Total knee arthroplasty  05/22/2011    Procedure: TOTAL KNEE ARTHROPLASTY;   Surgeon: Mauri Pole, MD;  Location: [facility];  Service: Orthopedics;  Laterality: Right;    There were no vitals filed for this visit.  Visit Diagnosis:  Postural instability  Abnormality of gait  Bradykinesia  Unsteadiness      Subjective Assessment - 03/10/15 1104    Subjective Just came from Dr. Doristine Devoid office.  No changes with medications; Dr. [name] will be referring me to pain management for fibromyalgia.   Currently in Pain? Yes   Pain Score 7    Pain Location --  generalized fibromyalgia   Pain Orientation --  all over   Pain Descriptors / Indicators Aching   Pain Type Chronic pain  fibromyalgia   Pain Onset More than a month ago  PT not monitoring pain due to chronic in nature   Pain Frequency Constant   Aggravating Factors  weather related   Pain Relieving Factors medications-some days better than others                         [facility] Adult PT Treatment/Exercise - 03/10/15 1109    Ambulation/Gait   Ambulation/Gait Yes   Ambulation/Gait Assistance 6: Modified independent (Device/Increase time)   Ambulation/Gait Assistance Details Used cane for training; cues  for reciprocal sequence with cane negotiating furniture with cane   Ambulation Distance (Feet) 450 Feet  x 2 reps with cane   Assistive device Straight cane   Gait Pattern Step-through pattern;Decreased step length - right;Decreased step length - left;Poor foot clearance - left;Poor foot clearance - right   Ambulation Surface Level;Indoor   Gait Comments Pt initially able to sequence cane well, then gets slightly off sequence with conversation or with turns during gait.  Pt able to self-correct cane sequence.  Again, had discussion regarding  assistive device-discussed utilizing cane for shorter distances of gait if needed; 4-wheeled RW for longer distances of community gait.  Pt does appear to have increased step length with use of cane.   High Level Balance   High Level Balance Comments In  parallel bars, on rockerboard:  anterior/posterior weightshifting for hip/ankle strategy work; balancing on rockerboard with UE activities then with head turns/nods, with min guard/supervision.  On foam balance beam:  marching in place, forward kicks, forward step taps, backward step taps x 10 reps with intermittent UE support; on foam cushion-side step taps with intermittent UE support.  PT provides supervision and cues for improved foot clearance with step tap activities in varied directions.   Self-Care   Self-Care Other Self-Care Comments   Other Self-Care Comments  Provided patient with handout on community fitness options.  Specifically discussed the [facility] as a place to utilize machines and gym for walking; discussed option of [facility] at home on [address].  Discussed options for aquatic therapy including [place].  Pt feels she will have to work around her work schedule to go to a center for exercise.                PT Education - 03/10/15 1154    Education provided Yes   Education Details Community fitness options, including [place] - 02/03/15 [id number]    PT SHORT TERM GOAL #1   Title Pt will be independent with HEP for improved functional mobility, transfers, posture, gait.  TARGET 02/04/15   Time 4   Period Weeks   Status Achieved   PT SHORT TERM GOAL #2   Title Pt will improve 5x sit<>stand transfers to less than or equal to 20 seconds for improved efficiency and safety with transfers.   Baseline 16.81 sec with slight pushing with lower extremities on chair-02/02/15   Time 4   Period Weeks   Status Achieved   PT SHORT TERM GOAL #3   Title Pt will improve TUG score to less than or equal to 15 seconds for decreased fall risk.   Baseline TUG 16.73 sec 02/02/15   Time 4   Period Weeks   Status Not Met   PT SHORT TERM GOAL #4   Title Pt will verbalize understanding of local Parkinson's/MSA related  resources.   Status On-going           PT Long Term Goals - 03/10/15 1156    PT LONG TERM GOAL #1   Title Pt will verbalize understanding of fall prevention within home environment.  TARGET  03/06/15  (GOAL COMPLETED)   Time 8   Period Weeks   Status Achieved   PT LONG TERM GOAL #2   Title Pt will improve 5x sit<>stand transfers to less than or equal to 15 seconds for improved efficiency and safety with transfers.  (GOAL ONGOING-TARGET 04/09/15)  Baseline 18.90 sec on 03/05/15   Time 4   Period Weeks   Status On-going   PT LONG TERM GOAL #3   Title Pt will improve TUG manual to less than or equal to 14.5 seconds for decreased fall risk/improved dual tasking.  (GOAL ONGOING-TARGET 04/09/15)   Baseline 16.63 sec   Time 4   Period Weeks   Status On-going   PT LONG TERM GOAL #4   Title Pt will improve gait velocity to at least 2.3 ft/sec for imrpoved gait efficiency and safety.  (GOAL MET-COMPLETED)   Baseline 2.3 ft/sec   Time 8   Period Weeks   Status Achieved   PT LONG TERM GOAL #5   Title Pt will improve Functional Gait Assessment to at least 20/30 for decreased fall risk.  GOAL MODIFIED-TARGET 04/09/15   Baseline 17/30 03/05/15   Time 4   Period Weeks   Status On-going   Additional Long Term Goals   Additional Long Term Goals Yes   PT LONG TERM GOAL #6   Title Pt will verbalize plans for ongoing community fitness upon D/C from PT.  TARGET 04/09/15   Time 4   Status New               Plan - 03/10/15 1159    Clinical Impression Statement Recert completed today, with LTG #2, 3, 5 ongoing, and new LTG 6 to address community fitness transition upon completion of PT.  Pt continues to demonstrate improved steadiness with assistive device, whether cane or rollator.  Have discussed use of appropriate assistive device given pt's fatigue level, pain, and changes in balance during the course of the day.  Pt will conitnue to benefit from further skilled PT to address balance,  gait, functional strength and transition to community fitness.   Pt will benefit from skilled therapeutic intervention in order to improve on the following deficits Abnormal gait;Decreased balance;Decreased mobility;Decreased strength;Difficulty walking;Postural dysfunction   Rehab Potential Good   PT Frequency 2x / week   PT Duration 4 weeks  recert 06/13/15 visit   PT Treatment/Interventions ADLs/Self Care Home Management;Therapeutic exercise;Therapeutic activities;Functional mobility training;Gait training;Balance training;Neuromuscular re-education;Patient/family education   PT Next Visit Plan Continue gait and balance activities, compliant surfaces, follow up with community fitness plans; provide optimal fitness information to patient   Consulted and Agree with Plan of Care Patient        Problem List Patient Active Problem List   Diagnosis Date Noted  . Hypercoagulable state ([place]) 02/07/2012  . Embolism ([place]) 02/07/2012  . S/P right TKA 05/22/2011    Naheim Burgen W. 03/10/2015, 12:06 PM  Frazier Butt., PT  [facility] [address] [place], [place], [address] Phone: [phone]   Fax:  [phone]  Name: [name] [name] MRN: [id number] Date of Birth: 31-Dec-1945

## 2015-03-12 ENCOUNTER — Ambulatory Visit: Payer: Medicare Other | Admitting: Neurology

## 2015-03-12 ENCOUNTER — Ambulatory Visit: Payer: Medicare Other | Admitting: Physical Therapy

## 2015-03-12 DIAGNOSIS — R293 Abnormal posture: Secondary | ICD-10-CM

## 2015-03-12 DIAGNOSIS — R269 Unspecified abnormalities of gait and mobility: Secondary | ICD-10-CM

## 2015-03-12 DIAGNOSIS — R2681 Unsteadiness on feet: Secondary | ICD-10-CM | POA: Diagnosis not present

## 2015-03-12 DIAGNOSIS — R258 Other abnormal involuntary movements: Secondary | ICD-10-CM | POA: Diagnosis not present

## 2015-03-12 NOTE — Patient Instructions (Signed)

## 2015-03-12 NOTE — Therapy (Signed)
[facility] [address] [place], [place], [address] Phone: [phone]   Fax:  [phone]  Physical Therapy Treatment  Patient Details  Name: [name] [name] MRN: [id number] Date of Birth: May 15, 1945 Referring Provider: Alonza Bogus, DO  Encounter Date: 03/12/2015      PT End of Session - 03/12/15 1156    Visit Number 12   Number of Visits 18   Date for PT Re-Evaluation 05/06/15   Authorization Type Medicare G-code every 10th visit   PT Start Time 1103   PT Stop Time 1144   PT Time Calculation (min) 41 min   Equipment Utilized During Treatment Gait belt   Activity Tolerance Patient tolerated treatment well   Behavior During Therapy [facility] for tasks assessed/performed      Past Medical History  Diagnosis Date  . Thyroid disease     Hypothyroidism  . Hypercholesterolemia   . Hyperlipidemia   . Chronic insomnia   . Glaucoma   . Fibromyalgia   . Arthritis   . Hypothyroidism     s/p graves disease  . Sinus infection     at present- dr Honor Loh PA aware- was seen there and at PCP 05/10/11  . Peripheral vascular disease ([place])     PULMONARY EMBOLUS x 2- 2011, 2012/ FOLLOWED BY DR ODOGWU-LOV 12/12 EPIC   PT STAES WILL STOP COUMADIN 3/12 and begin LOVONOX 05/17/11 as previously instructed  . Complication of anesthesia     severe itching night of surgery requiring meds- also couldnt swallow- throat "paralyzed""  . Hypertension     PCP Dr Cari Caraway- [place]  05/15/11 with clearance and note on chart,    chest x ray, EKG 12/12 EPIC, eccho 7/11 EPIC  . Kidney stone   . PD (Parkinson's disease) [facility])     Past Surgical History  Procedure Laterality Date  . Abdominal hysterectomy    . Exploratory laparotomy    . Joint replacement      left knee  7/12  . Colonoscopy    . Cataract extraction Bilateral   . Appendectomy    . Cholecystectomy    . Total knee arthroplasty  05/22/2011    Procedure: TOTAL KNEE ARTHROPLASTY;   Surgeon: Mauri Pole, MD;  Location: [facility];  Service: Orthopedics;  Laterality: Right;    There were no vitals filed for this visit.  Visit Diagnosis:  Postural instability  Abnormality of gait  Bradykinesia      Subjective Assessment - 03/12/15 1109    Subjective Had a spell last night where I felt like I was going to fall.  I held onto the bed and didn't fall.   Currently in Pain? Yes   Pain Score 4    Pain Location --  generalized pain-fibromyalgia   Pain Descriptors / Indicators Aching   Pain Type Chronic pain  fibromyalgia   Pain Frequency Constant   Aggravating Factors  weather related   Pain Relieving Factors medications                         [facility] Adult PT Treatment/Exercise - 03/12/15 1108    High Level Balance   High Level Balance Comments In parallel bars on foam surface:  marching in place, forward kicks, forward step taps, back step taps, side step taps x 15 reps each with UE support, cues for deliberate movement.  PT provides close supervision/min guard assistance.  In parallel bars on red mat and  blue mat compliant surfaces-multiple reps of forward/back walking, forward/back marching, sidestepping with UE support with supervision.  PT provides cues for upright posture, improved foot clearance, while working on compliant surfaces.   Self-Care   Self-Care Other Self-Care Comments   Other Self-Care Comments  Followed up with patient regarding community fitness-pt reports with impending winter weather, she has not had time to contact or visit [facility].  Provided patient with and discussed optimal fitness following PT DC-including PT HEP, walking program, and aerobic exercise.   Knee/Hip Exercises: Aerobic   Stationary Bike SciFit, Level 2.5, 4 extremities x 5 minutes, for leg strengthening, cues for intensity >70 RPM                PT Education - 03/12/15 1156    Education provided Yes   Education Details Optimal fitness for  people with Parkinsonism following discharge   Person(s) Educated Patient   Methods Explanation   Comprehension Verbalized understanding          PT Short Term Goals - 02/03/15 0948    PT SHORT TERM GOAL #1   Title Pt will be independent with HEP for improved functional mobility, transfers, posture, gait.  TARGET 02/04/15   Time 4   Period Weeks   Status Achieved   PT SHORT TERM GOAL #2   Title Pt will improve 5x sit<>stand transfers to less than or equal to 20 seconds for improved efficiency and safety with transfers.   Baseline 16.81 sec with slight pushing with lower extremities on chair-02/02/15   Time 4   Period Weeks   Status Achieved   PT SHORT TERM GOAL #3   Title Pt will improve TUG score to less than or equal to 15 seconds for decreased fall risk.   Baseline TUG 16.73 sec 02/02/15   Time 4   Period Weeks   Status Not Met   PT SHORT TERM GOAL #4   Title Pt will verbalize understanding of local Parkinson's/MSA related resources.   Status On-going           PT Long Term Goals - 03/10/15 1156    PT LONG TERM GOAL #1   Title Pt will verbalize understanding of fall prevention within home environment.  TARGET  03/06/15  (GOAL COMPLETED)   Time 8   Period Weeks   Status Achieved   PT LONG TERM GOAL #2   Title Pt will improve 5x sit<>stand transfers to less than or equal to 15 seconds for improved efficiency and safety with transfers.  (GOAL ONGOING-TARGET 04/09/15)   Baseline 18.90 sec on 03/05/15   Time 4   Period Weeks   Status On-going   PT LONG TERM GOAL #3   Title Pt will improve TUG manual to less than or equal to 14.5 seconds for decreased fall risk/improved dual tasking.  (GOAL ONGOING-TARGET 04/09/15)   Baseline 16.63 sec   Time 4   Period Weeks   Status On-going   PT LONG TERM GOAL #4   Title Pt will improve gait velocity to at least 2.3 ft/sec for imrpoved gait efficiency and safety.  (GOAL MET-COMPLETED)   Baseline 2.3 ft/sec   Time 8   Period Weeks    Status Achieved   PT LONG TERM GOAL #5   Title Pt will improve Functional Gait Assessment to at least 20/30 for decreased fall risk.  GOAL MODIFIED-TARGET 04/09/15   Baseline 17/30 03/05/15   Time 4   Period Weeks   Status On-going  Additional Long Term Goals   Additional Long Term Goals Yes   PT LONG TERM GOAL #6   Title Pt will verbalize plans for ongoing community fitness upon D/C from PT.  TARGET 04/09/15   Time 4   Status New               Plan - 03/12/15 1201    Clinical Impression Statement Pt tolerates compliant surfaces well today in parallel bars, with definite use of UE support for balance.  Pt will continue to benefit from further skilled PT to address compliant surfaces with cane/walker, functional strength, balance and gait.   Pt will benefit from skilled therapeutic intervention in order to improve on the following deficits Abnormal gait;Decreased balance;Decreased mobility;Decreased strength;Difficulty walking;Postural dysfunction   Rehab Potential Good   PT Frequency 2x / week   PT Duration 4 weeks  recert 11/04/53 visit   PT Treatment/Interventions ADLs/Self Care Home Management;Therapeutic exercise;Therapeutic activities;Functional mobility training;Gait training;Balance training;Neuromuscular re-education;Patient/family education   PT Next Visit Plan Follow up on community fitness plans, compliant surfaces with progression to using cane, gait and balance activities   Consulted and Agree with Plan of Care Patient        Problem List Patient Active Problem List   Diagnosis Date Noted  . Hypercoagulable state ([place]) 02/07/2012  . Embolism ([place]) 02/07/2012  . S/P right TKA 05/22/2011    Nyzaiah Kai W. 03/12/2015, 12:04 PM  Frazier Butt., PT  [facility] [address] [place], [place], [address] Phone: [phone]   Fax:  [phone]  Name: [name] [name] MRN: [id number] Date of Birth:  01-01-1946

## 2015-03-17 ENCOUNTER — Ambulatory Visit: Payer: Medicare Other | Admitting: Physical Therapy

## 2015-03-18 ENCOUNTER — Ambulatory Visit: Payer: Medicare Other | Admitting: Physical Therapy

## 2015-03-23 ENCOUNTER — Ambulatory Visit: Payer: Medicare Other | Admitting: Physical Therapy

## 2015-03-23 DIAGNOSIS — R258 Other abnormal involuntary movements: Secondary | ICD-10-CM | POA: Diagnosis not present

## 2015-03-23 DIAGNOSIS — R269 Unspecified abnormalities of gait and mobility: Secondary | ICD-10-CM

## 2015-03-23 DIAGNOSIS — R2681 Unsteadiness on feet: Secondary | ICD-10-CM

## 2015-03-23 DIAGNOSIS — R293 Abnormal posture: Secondary | ICD-10-CM | POA: Diagnosis not present

## 2015-03-23 NOTE — Therapy (Signed)
[facility] [address] [place], [place], [address] Phone: [phone]   Fax:  [phone]  Physical Therapy Treatment  Patient Details  Name: [name] [name] MRN: [id number] Date of Birth: March 09, 1945 Referring Provider: Alonza Bogus, DO  Encounter Date: 03/23/2015      PT End of Session - 03/23/15 1253    Visit Number 13   Number of Visits 18   Authorization Type Medicare G-code every 10th visit   PT Start Time 1100   PT Stop Time 1144   PT Time Calculation (min) 44 min      Past Medical History  Diagnosis Date  . Thyroid disease     Hypothyroidism  . Hypercholesterolemia   . Hyperlipidemia   . Chronic insomnia   . Glaucoma   . Fibromyalgia   . Arthritis   . Hypothyroidism     s/p graves disease  . Sinus infection     at present- dr Honor Loh PA aware- was seen there and at PCP 05/10/11  . Peripheral vascular disease ([place])     PULMONARY EMBOLUS x 2- 2011, 2012/ FOLLOWED BY DR ODOGWU-LOV 12/12 EPIC   PT STAES WILL STOP COUMADIN 3/12 and begin LOVONOX 05/17/11 as previously instructed  . Complication of anesthesia     severe itching night of surgery requiring meds- also couldnt swallow- throat "paralyzed""  . Hypertension     PCP Dr Cari Caraway- [place]  05/15/11 with clearance and note on chart,    chest x ray, EKG 12/12 EPIC, eccho 7/11 EPIC  . Kidney stone   . PD (Parkinson's disease) [facility])     Past Surgical History  Procedure Laterality Date  . Abdominal hysterectomy    . Exploratory laparotomy    . Joint replacement      left knee  7/12  . Colonoscopy    . Cataract extraction Bilateral   . Appendectomy    . Cholecystectomy    . Total knee arthroplasty  05/22/2011    Procedure: TOTAL KNEE ARTHROPLASTY;  Surgeon: Mauri Pole, MD;  [facility];  Service: Orthopedics;  Laterality: Right;    There were no vitals filed for this visit.  Visit Diagnosis:  Postural instability  Abnormality of  gait  Bradykinesia  Unsteadiness      Subjective Assessment - 03/23/15 1116    Subjective No recent falll ; she wants to get a rail for the bed to make that safer; getting out of bed is hardest thing ;  doing stairs i am very careful to use the railing;  she has a recumbent bike at home ;she tries to ride it 2 miles ; she tries to do it daily   Currently in Pain? Yes   Pain Score 4    Pain Descriptors / Indicators Aching   Pain Type Chronic pain            Therapeutic exercise Sit to stand x 10;  LSVT; ex's;   Reach back step back; with head mvt; looking to finger tips when reaching out;   Step back reach forward Step forward reach back   30 second sit to stand ;  Able to do 10 reps w/o UE help ( arms crossed)                      PT Education - 03/23/15 1132    Education provided Yes   Education Details continued  disucssion for fitness to [employer];  home  program daily therye d          PT Short Term Goals - 02/03/15 0948    PT SHORT TERM GOAL #1   Title Pt will be independent with HEP for improved functional mobility, transfers, posture, gait.  TARGET 02/04/15   Time 4   Period Weeks   Status Achieved   PT SHORT TERM GOAL #2   Title Pt will improve 5x sit<>stand transfers to less than or equal to 20 seconds for improved efficiency and safety with transfers.   Baseline 16.81 sec with slight pushing with lower extremities on chair-02/02/15   Time 4   Period Weeks   Status Achieved   PT SHORT TERM GOAL #3   Title Pt will improve TUG score to less than or equal to 15 seconds for decreased fall risk.   Baseline TUG 16.73 sec 02/02/15   Time 4   Period Weeks   Status Not Met   PT SHORT TERM GOAL #4   Title Pt will verbalize understanding of local Parkinson's/MSA related resources.   Status On-going           PT Long Term Goals - 03/10/15 1156    PT LONG TERM GOAL #1   Title Pt will verbalize understanding of fall prevention  within home environment.  TARGET  03/06/15  (GOAL COMPLETED)   Time 8   Period Weeks   Status Achieved   PT LONG TERM GOAL #2   Title Pt will improve 5x sit<>stand transfers to less than or equal to 15 seconds for improved efficiency and safety with transfers.  (GOAL ONGOING-TARGET 04/09/15)   Baseline 18.90 sec on 03/05/15   Time 4   Period Weeks   Status On-going   PT LONG TERM GOAL #3   Title Pt will improve TUG manual to less than or equal to 14.5 seconds for decreased fall risk/improved dual tasking.  (GOAL ONGOING-TARGET 04/09/15)   Baseline 16.63 sec   Time 4   Period Weeks   Status On-going   PT LONG TERM GOAL #4   Title Pt will improve gait velocity to at least 2.3 ft/sec for imrpoved gait efficiency and safety.  (GOAL MET-COMPLETED)   Baseline 2.3 ft/sec   Time 8   Period Weeks   Status Achieved   PT LONG TERM GOAL #5   Title Pt will improve Functional Gait Assessment to at least 20/30 for decreased fall risk.  GOAL MODIFIED-TARGET 04/09/15   Baseline 17/30 03/05/15   Time 4   Period Weeks   Status On-going   Additional Long Term Goals   Additional Long Term Goals Yes   PT LONG TERM GOAL #6   Title Pt will verbalize plans for ongoing community fitness upon D/C from PT.  TARGET 04/09/15   Time 4   Status New               Plan - 03/23/15 1254    Clinical Impression Statement Pt has multi personal and medical issues; needs to focus on a few things she can do dialy to help her improve her overall function and lower fall risk;  she tends to focus on deficits vs abilities;  she tolerated the ex's very well today;  there was a couple episodes of dizzynes with head mvt. that resolved quickly  ; no exacerbation of pain with ex's         Problem List Patient Active Problem List   Diagnosis Date Noted  . Hypercoagulable state ([place])  02/07/2012  . Embolism ([place]) 02/07/2012  . S/P right TKA 05/22/2011    Rosaura Carpenter D PT DPT  03/23/2015, 12:58 PM  [facility] [address] [place], [place], [address] Phone: [phone]   Fax:  [phone]  Name: Sheneika Walstad MRN: [id number] Date of Birth: [birth year]

## 2015-03-23 NOTE — Patient Instructions (Signed)
Discussed her involvement and consistency with daily there ex and need to work within her ability but to find method that will progress her function;

## 2015-03-24 ENCOUNTER — Ambulatory Visit: Payer: Medicare Other | Admitting: Physical Therapy

## 2015-03-26 ENCOUNTER — Encounter: Payer: Self-pay | Admitting: Physical Therapy

## 2015-03-26 ENCOUNTER — Ambulatory Visit: Payer: Medicare Other | Admitting: Physical Therapy

## 2015-03-26 DIAGNOSIS — R2681 Unsteadiness on feet: Secondary | ICD-10-CM

## 2015-03-26 DIAGNOSIS — R258 Other abnormal involuntary movements: Secondary | ICD-10-CM

## 2015-03-26 DIAGNOSIS — R269 Unspecified abnormalities of gait and mobility: Secondary | ICD-10-CM | POA: Diagnosis not present

## 2015-03-26 DIAGNOSIS — R293 Abnormal posture: Secondary | ICD-10-CM | POA: Diagnosis not present

## 2015-03-26 NOTE — Therapy (Signed)
[facility] [address] [place], [place], [address] Phone: [phone]   Fax:  [phone]  Physical Therapy Treatment  Patient Details  Name: [name] [name] MRN: [id number] Date of Birth: 08-27-1945 Referring Provider: Alonza Bogus, DO  Encounter Date: 03/26/2015      PT End of Session - 03/26/15 1457    Visit Number 14   Number of Visits 18   Date for PT Re-Evaluation 05/06/15   Authorization Type Medicare G-code every 10th visit   PT Start Time 1447   PT Stop Time 1530   PT Time Calculation (min) 43 min      Past Medical History  Diagnosis Date  . Thyroid disease     Hypothyroidism  . Hypercholesterolemia   . Hyperlipidemia   . Chronic insomnia   . Glaucoma   . Fibromyalgia   . Arthritis   . Hypothyroidism     s/p graves disease  . Sinus infection     at present- dr Honor Loh PA aware- was seen there and at PCP 05/10/11  . Peripheral vascular disease ([place])     PULMONARY EMBOLUS x 2- 2011, 2012/ FOLLOWED BY DR ODOGWU-LOV 12/12 EPIC   PT STAES WILL STOP COUMADIN 3/12 and begin LOVONOX 05/17/11 as previously instructed  . Complication of anesthesia     severe itching night of surgery requiring meds- also couldnt swallow- throat "paralyzed""  . Hypertension     PCP Dr Cari Caraway- [place]  05/15/11 with clearance and note on chart,    chest x ray, EKG 12/12 EPIC, eccho 7/11 EPIC  . Kidney stone   . PD (Parkinson's disease) [facility])     Past Surgical History  Procedure Laterality Date  . Abdominal hysterectomy    . Exploratory laparotomy    . Joint replacement      left knee  7/12  . Colonoscopy    . Cataract extraction Bilateral   . Appendectomy    . Cholecystectomy    . Total knee arthroplasty  05/22/2011    Procedure: TOTAL KNEE ARTHROPLASTY;  Surgeon: Mauri Pole, MD;  [facility]: [facility];  Service: Orthopedics;  Laterality: Right;    There were no vitals filed for this visit.  Visit Diagnosis:  Postural  instability  Abnormality of gait  Bradykinesia  Unsteadiness      Subjective Assessment - 03/26/15 1452    Subjective No new complaints. No falls to report. Was very sore after last session.    Pertinent History History of bilateral TKR; fibromyalgia   Patient Stated Goals Pt's goal for therapy is to improve balance.   Currently in Pain? Yes   Pain Score 4    Pain Location Generalized   Pain Orientation Other (Comment)  all over   Pain Descriptors / Indicators Aching;Discomfort;Constant   Pain Type Chronic pain   Pain Onset More than a month ago   Pain Frequency Constant   Aggravating Factors  fibromyalgia, weather related   Pain Relieving Factors medication, rest             [facility]) - 03/26/15 1507    PWR! exercises Moves in standing;Moves in sitting   PWR! Up 15   PWR! Rock 15   PWR! Twist 15   PWR Step 15   Comments in standing: cues on posture and ex form.   PWR! Up 10   PWR! Rock 10   PWR! Twist 10   PWR! Step 10   Comments pt seated in folding  chair for these. verbal and visual cues needed on correct form and technique.           Balance Exercises - 03/26/15 1506    Balance Exercises: Standing   Gait with Head Turns Forward;Limitations   Other Standing Exercises gait with speed changes fast<>slow x 230 feet with min guard assist   Balance Exercises: Standing   Tandem Gait Limitations gait along 50 foot hallway: head turns left<>right and up<>down x 2 laps each with min guard assist to min assist           PT Education - 03/26/15 1528    Education provided Yes   Education Details seated PWR! exercises. Reissued standing PWR! exercises as pt reports she lost her's.   Person(s) Educated Patient   Methods Explanation;Demonstration;Handout;Verbal cues   Comprehension Verbalized understanding;Returned demonstration;Need further instruction;Verbal cues required          PT Short Term Goals - 02/03/15 0948    PT SHORT TERM GOAL #1   Title Pt  will be independent with HEP for improved functional mobility, transfers, posture, gait.  TARGET 02/04/15   Time 4   Period Weeks   Status Achieved   PT SHORT TERM GOAL #2   Title Pt will improve 5x sit<>stand transfers to less than or equal to 20 seconds for improved efficiency and safety with transfers.   Baseline 16.81 sec with slight pushing with lower extremities on chair-02/02/15   Time 4   Period Weeks   Status Achieved   PT SHORT TERM GOAL #3   Title Pt will improve TUG score to less than or equal to 15 seconds for decreased fall risk.   Baseline TUG 16.73 sec 02/02/15   Time 4   Period Weeks   Status Not Met   PT SHORT TERM GOAL #4   Title Pt will verbalize understanding of local Parkinson's/MSA related resources.   Status On-going           PT Long Term Goals - 03/10/15 1156    PT LONG TERM GOAL #1   Title Pt will verbalize understanding of fall prevention within home environment.  TARGET  03/06/15  (GOAL COMPLETED)   Time 8   Period Weeks   Status Achieved   PT LONG TERM GOAL #2   Title Pt will improve 5x sit<>stand transfers to less than or equal to 15 seconds for improved efficiency and safety with transfers.  (GOAL ONGOING-TARGET 04/09/15)   Baseline 18.90 sec on 03/05/15   Time 4   Period Weeks   Status On-going   PT LONG TERM GOAL #3   Title Pt will improve TUG manual to less than or equal to 14.5 seconds for decreased fall risk/improved dual tasking.  (GOAL ONGOING-TARGET 04/09/15)   Baseline 16.63 sec   Time 4   Period Weeks   Status On-going   PT LONG TERM GOAL #4   Title Pt will improve gait velocity to at least 2.3 ft/sec for imrpoved gait efficiency and safety.  (GOAL MET-COMPLETED)   Baseline 2.3 ft/sec   Time 8   Period Weeks   Status Achieved   PT LONG TERM GOAL #5   Title Pt will improve Functional Gait Assessment to at least 20/30 for decreased fall risk.  GOAL MODIFIED-TARGET 04/09/15   Baseline 17/30 03/05/15   Time 4   Period Weeks   Status  On-going   Additional Long Term Goals   Additional Long Term Goals Yes   PT LONG TERM  GOAL #6   Title Pt will verbalize plans for ongoing community fitness upon D/C from PT.  TARGET 04/09/15   Time 4   Status New            Plan - 03/26/15 1457    Clinical Impression Statement Continued to work on [employer] exercises today without any issues reported. Pt is making steady progress toward goals.l   Pt will benefit from skilled therapeutic intervention in order to improve on the following deficits Abnormal gait;Decreased balance;Decreased mobility;Decreased strength;Difficulty walking;Postural dysfunction   Rehab Potential Good   PT Frequency 2x / week   PT Duration 4 weeks  recert 11/06/53 visit   PT Treatment/Interventions ADLs/Self Care Home Management;Therapeutic exercise;Therapeutic activities;Functional mobility training;Gait training;Balance training;Neuromuscular re-education;Patient/family education   PT Next Visit Plan Follow up on community fitness plans, compliant surfaces with progression to using cane, gait and balance activities,    Consulted and Agree with Plan of Care Patient        Problem List Patient Active Problem List   Diagnosis Date Noted  . Hypercoagulable state ([place]) 02/07/2012  . Embolism ([place]) 02/07/2012  . S/P right TKA 05/22/2011    Willow Ora 03/27/2015, 9:00 PM  Willow Ora, PTA, [facility] [address], [place], [place] [address] [phone] 03/27/2015, 9:01 PM   Name: Ja Pistole MRN: [id number] Date of Birth: [birth year]

## 2015-03-29 ENCOUNTER — Telehealth: Payer: Self-pay | Admitting: Neurology

## 2015-03-29 NOTE — Telephone Encounter (Signed)
PT called in regards to a referral for pain management/Dawn CB#[phone] ok to leave message

## 2015-03-29 NOTE — Telephone Encounter (Signed)
[facility] making patient aware referral was sent to Dr Letta Pate and number provided for patient to call their office directly.

## 2015-03-30 ENCOUNTER — Ambulatory Visit: Payer: Medicare Other

## 2015-03-31 ENCOUNTER — Encounter: Payer: Self-pay | Admitting: Physical Therapy

## 2015-03-31 ENCOUNTER — Ambulatory Visit: Payer: Medicare Other | Admitting: Physical Therapy

## 2015-03-31 DIAGNOSIS — R258 Other abnormal involuntary movements: Secondary | ICD-10-CM

## 2015-03-31 DIAGNOSIS — I2699 Other pulmonary embolism without acute cor pulmonale: Secondary | ICD-10-CM | POA: Diagnosis not present

## 2015-03-31 DIAGNOSIS — R293 Abnormal posture: Secondary | ICD-10-CM

## 2015-03-31 DIAGNOSIS — R269 Unspecified abnormalities of gait and mobility: Secondary | ICD-10-CM | POA: Diagnosis not present

## 2015-03-31 DIAGNOSIS — Z7901 Long term (current) use of anticoagulants: Secondary | ICD-10-CM | POA: Diagnosis not present

## 2015-03-31 DIAGNOSIS — R2681 Unsteadiness on feet: Secondary | ICD-10-CM | POA: Diagnosis not present

## 2015-03-31 NOTE — Therapy (Signed)
[facility] [address] [place], [place], [address] Phone: [phone]   Fax:  [phone]  Physical Therapy Treatment  Patient Details  Name: [name] [name] MRN: [id number] Date of Birth: [birth year] Referring Provider: Alonza Bogus, DO  Encounter Date: 03/31/2015      PT End of Session - 03/31/15 1326    Visit Number 15   Number of Visits 18   Date for PT Re-Evaluation 05/06/15   Authorization Type Medicare G-code every 10th visit   PT Start Time 1318   PT Stop Time 1400   PT Time Calculation (min) 42 min      Past Medical History  Diagnosis Date  . Thyroid disease     Hypothyroidism  . Hypercholesterolemia   . Hyperlipidemia   . Chronic insomnia   . Glaucoma   . Fibromyalgia   . Arthritis   . Hypothyroidism     s/p graves disease  . Sinus infection     at present- dr Honor Loh PA aware- was seen there and at PCP 05/10/11  . Peripheral vascular disease ([place])     PULMONARY EMBOLUS x 2- 2011, 2012/ FOLLOWED BY DR ODOGWU-LOV 12/12 EPIC   PT STAES WILL STOP COUMADIN 3/12 and begin LOVONOX 05/17/11 as previously instructed  . Complication of anesthesia     severe itching night of surgery requiring meds- also couldnt swallow- throat "paralyzed""  . Hypertension     PCP Dr Cari Caraway- [place]  05/15/11 with clearance and note on chart,    chest x ray, EKG 12/12 EPIC, eccho 7/11 EPIC  . Kidney stone   . PD (Parkinson's disease) [facility])     Past Surgical History  Procedure Laterality Date  . Abdominal hysterectomy    . Exploratory laparotomy    . Joint replacement      left knee  7/12  . Colonoscopy    . Cataract extraction Bilateral   . Appendectomy    . Cholecystectomy    . Total knee arthroplasty  05/22/2011    Procedure: TOTAL KNEE ARTHROPLASTY;  Surgeon: Mauri Pole, MD;  Location: [facility];  Service: Orthopedics;  Laterality: Right;    There were no vitals filed for this visit.  Visit Diagnosis:  Postural  instability  Abnormality of gait  Unsteadiness  Bradykinesia      Subjective Assessment - 03/31/15 1322    Subjective No new complaints. No falls to report. Reports was in a lot of pain today and yesterday. Has called about pain management appointment and was told they per policy wait 4-6 weeks after getting a referral to schedule the initial appointment. Not sure what to do now except wait. Took 2 extra strenght tylenol before session today.  Son in law put up grab bars to assist with toilet transfers for when she stays there (her house already has them).                                         Pertinent History History of bilateral TKR; fibromyalgia   Patient Stated Goals Pt's goal for therapy is to improve balance.   Currently in Pain? Yes   Pain Score 8    Pain Location Generalized   Pain Orientation Other (Comment)  all over   Pain Descriptors / Indicators Aching;Constant;Discomfort   Pain Type Chronic pain   Pain Onset More than a month ago  Pain Frequency Constant   Aggravating Factors  fibromyaligia, weaither related   Pain Relieving Factors medicaiton, rest          PWR [facility]) - 03/31/15 1342    PWR! Up 20   PWR! Rock 20   PWR! Twist 20   PWR Step 20   Comments standing next to counter, cues on posture and ex form initially, then none other than worksheet.   PWR! Up 20   PWR! Rock 20   PWR! Twist 20   PWR! Step 15   Comments pt seated, min cues on correct form and technique initially, progressing to no cues needed other than worksheet     Self Care: Discussed and provided pt with information for community fitness. Gave info on silver sneakers as pt reports her son in [place] mother attends silver sneakers.           PT Short Term Goals - 02/03/15 0948    PT SHORT TERM GOAL #1   Title Pt will be independent with HEP for improved functional mobility, transfers, posture, gait.  TARGET 02/04/15   Time 4   Period Weeks   Status Achieved   PT SHORT TERM GOAL  #2   Title Pt will improve 5x sit<>stand transfers to less than or equal to 20 seconds for improved efficiency and safety with transfers.   Baseline 16.81 sec with slight pushing with lower extremities on chair-02/02/15   Time 4   Period Weeks   Status Achieved   PT SHORT TERM GOAL #3   Title Pt will improve TUG score to less than or equal to 15 seconds for decreased fall risk.   Baseline TUG 16.73 sec 02/02/15   Time 4   Period Weeks   Status Not Met   PT SHORT TERM GOAL #4   Title Pt will verbalize understanding of local Parkinson's/MSA related resources.   Status On-going           PT Long Term Goals - 03/31/15 1359    PT LONG TERM GOAL #1   Title Pt will verbalize understanding of fall prevention within home environment.  TARGET  03/06/15  (GOAL COMPLETED)   Time 8   Period Weeks   Status Achieved   PT LONG TERM GOAL #2   Title Pt will improve 5x sit<>stand transfers to less than or equal to 15 seconds for improved efficiency and safety with transfers.  (GOAL ONGOING-TARGET 04/09/15)   Baseline 18.90 sec on 03/05/15   Time 4   Period Weeks   Status On-going   PT LONG TERM GOAL #3   Title Pt will improve TUG manual to less than or equal to 14.5 seconds for decreased fall risk/improved dual tasking.  (GOAL ONGOING-TARGET 04/09/15)   Baseline 16.63 sec   Time 4   Period Weeks   Status On-going   PT LONG TERM GOAL #4   Title Pt will improve gait velocity to at least 2.3 ft/sec for imrpoved gait efficiency and safety.  (GOAL MET-COMPLETED)   Baseline 2.3 ft/sec   Time 8   Period Weeks   Status Achieved   PT LONG TERM GOAL #5   Title Pt will improve Functional Gait Assessment to at least 20/30 for decreased fall risk.  GOAL MODIFIED-TARGET 04/09/15   Baseline 17/30 03/05/15   Time 4   Period Weeks   Status On-going   PT LONG TERM GOAL #6   Title Pt will verbalize plans for ongoing community fitness upon D/C from  PT.  TARGET 04/09/15   Baseline met 03/21/15: issued  information on silver sneakers   Status Achieved           Plan - 03/31/15 1327    Clinical Impression Statement Pt contined to need minimal cues on correct form and technique with PWR! exercises. Improved to no cues with repetition in session today. Issued information on Silver Sneakers today for pt to check into for community fitness options.  Pt making steady progress toward goals.                                                             Pt will benefit from skilled therapeutic intervention in order to improve on the following deficits Abnormal gait;Decreased balance;Decreased mobility;Decreased strength;Difficulty walking;Postural dysfunction   Rehab Potential Good   PT Frequency 2x / week   PT Duration 4 weeks  recert 05/06/23 visit   PT Treatment/Interventions ADLs/Self Care Home Management;Therapeutic exercise;Therapeutic activities;Functional mobility training;Gait training;Balance training;Neuromuscular re-education;Patient/family education   PT Next Visit Plan check LTGs for discharge at next visit.   Consulted and Agree with Plan of Care Patient        Problem List Patient Active Problem List   Diagnosis Date Noted  . Hypercoagulable state ([place]) 02/07/2012  . Embolism ([place]) 02/07/2012  . S/P right TKA 05/22/2011    Willow Ora 03/31/2015, 4:50 PM  Willow Ora, PTA, [place] [address] [place], [place] [address] [phone] 03/31/2015, 4:50 PM   Name: [name] [name] MRN: [id number] Date of Birth: Dec 23, 1945

## 2015-04-01 ENCOUNTER — Ambulatory Visit: Payer: Medicare Other | Admitting: Physical Therapy

## 2015-04-01 ENCOUNTER — Encounter: Payer: Self-pay | Admitting: Physical Therapy

## 2015-04-01 DIAGNOSIS — R269 Unspecified abnormalities of gait and mobility: Secondary | ICD-10-CM

## 2015-04-01 DIAGNOSIS — R2681 Unsteadiness on feet: Secondary | ICD-10-CM | POA: Diagnosis not present

## 2015-04-01 DIAGNOSIS — R293 Abnormal posture: Secondary | ICD-10-CM

## 2015-04-01 DIAGNOSIS — R258 Other abnormal involuntary movements: Secondary | ICD-10-CM

## 2015-04-01 NOTE — Therapy (Signed)
[facility] [address] [place], [place], [address] Phone: [phone]   Fax:  [phone]  Physical Therapy Treatment  Patient Details  Name: [name] [name] MRN: [id number] Date of Birth: 27-Jun-1945 Referring Provider: Alonza Bogus, DO  Encounter Date: 04/01/2015      PT End of Session - 04/01/15 1405    Visit Number 16   Number of Visits 18   Date for PT Re-Evaluation 05/06/15   Authorization Type Medicare G-code every 10th visit   PT Start Time 1400   PT Stop Time 1439   PT Time Calculation (min) 39 min      Past Medical History  Diagnosis Date  . Thyroid disease     Hypothyroidism  . Hypercholesterolemia   . Hyperlipidemia   . Chronic insomnia   . Glaucoma   . Fibromyalgia   . Arthritis   . Hypothyroidism     s/p graves disease  . Sinus infection     at present- dr Honor Loh PA aware- was seen there and at PCP 05/10/11  . Peripheral vascular disease ([place])     PULMONARY EMBOLUS x 2- 2011, 2012/ FOLLOWED BY DR ODOGWU-LOV 12/12 EPIC   PT STAES WILL STOP COUMADIN 3/12 and begin LOVONOX 05/17/11 as previously instructed  . Complication of anesthesia     severe itching night of surgery requiring meds- also couldnt swallow- throat "paralyzed""  . Hypertension     PCP Dr Cari Caraway- [place]  05/15/11 with clearance and note on chart,    chest x ray, EKG 12/12 EPIC, eccho 7/11 EPIC  . Kidney stone   . PD (Parkinson's disease) [facility])     Past Surgical History  Procedure Laterality Date  . Abdominal hysterectomy    . Exploratory laparotomy    . Joint replacement      left knee  7/12  . Colonoscopy    . Cataract extraction Bilateral   . Appendectomy    . Cholecystectomy    . Total knee arthroplasty  05/22/2011    Procedure: TOTAL KNEE ARTHROPLASTY;  Surgeon: Mauri Pole, MD;  Location: [facility];  Service: Orthopedics;  Laterality: Right;    There were no vitals filed for this visit.  Visit Diagnosis:  Postural  instability  Abnormality of gait  Unsteadiness  Bradykinesia      Subjective Assessment - 04/01/15 1403    Subjective No new complaints. No falls to report. No change in pain.   Pertinent History History of bilateral TKR; fibromyalgia   Patient Stated Goals Pt's goal for therapy is to improve balance.   Currently in Pain? Yes   Pain Score 6    Pain Location Generalized   Pain Orientation Other (Comment)  "all over"   Pain Descriptors / Indicators Aching;Constant;Discomfort   Pain Type Chronic pain   Pain Onset More than a month ago   Pain Frequency Constant   Aggravating Factors  fibromyalgia, weather related   Pain Relieving Factors medication, rest            [facility] PT Assessment - 04/01/15 1415    Functional Gait  Assessment   Gait assessed  Yes   Gait Level Surface Walks 20 ft in less than 5.5 sec, no assistive devices, good speed, no evidence for imbalance, normal gait pattern, deviates no more than 6 in outside of the 12 in walkway width.   Change in Gait Speed Able to smoothly change walking speed without loss of balance or gait deviation. Deviate  no more than 6 in outside of the 12 in walkway width.   Gait with Horizontal Head Turns Performs head turns smoothly with no change in gait. Deviates no more than 6 in outside 12 in walkway width   Gait with Vertical Head Turns Performs head turns with no change in gait. Deviates no more than 6 in outside 12 in walkway width.   Gait and Pivot Turn Pivot turns safely within 3 sec and stops quickly with no loss of balance.   Step Over Obstacle Is able to step over one shoe box (4.5 in total height) without changing gait speed. No evidence of imbalance.   Gait with Narrow Base of Support Ambulates 4-7 steps.   Gait with Eyes Closed Walks 20 ft, uses assistive device, slower speed, mild gait deviations, deviates 6-10 in outside 12 in walkway width. Ambulates 20 ft in less than 9 sec but greater than 7 sec.   Ambulating Backwards  Walks 20 ft, no assistive devices, good speed, no evidence for imbalance, normal gait   Steps Alternating feet, must use rail.   Total Score 25   FGA comment: Score improved from 17/30 last check          [facility] Adult PT Treatment/Exercise - 04/01/15 1415    Ambulation/Gait   Ambulation/Gait Yes   Ambulation/Gait Assistance 6: Modified independent (Device/Increase time)   Assistive device None   Ambulation Surface Level;Indoor   Gait velocity 12.22 sec= 2.68 ft/sec with no AD   Timed Up and Go Test   Normal TUG (seconds) 10.13   Manual TUG (seconds) 13.12             PT Short Term Goals - 02/03/15 0948    PT SHORT TERM GOAL #1   Title Pt will be independent with HEP for improved functional mobility, transfers, posture, gait.  TARGET 02/04/15   Time 4   Period Weeks   Status Achieved   PT SHORT TERM GOAL #2   Title Pt will improve 5x sit<>stand transfers to less than or equal to 20 seconds for improved efficiency and safety with transfers.   Baseline 16.81 sec with slight pushing with lower extremities on chair-02/02/15   Time 4   Period Weeks   Status Achieved   PT SHORT TERM GOAL #3   Title Pt will improve TUG score to less than or equal to 15 seconds for decreased fall risk.   Baseline TUG 16.73 sec 02/02/15   Time 4   Period Weeks   Status Not Met   PT SHORT TERM GOAL #4   Title Pt will verbalize understanding of local Parkinson's/MSA related resources.   Status On-going           PT Long Term Goals - 04/01/15 1405    PT LONG TERM GOAL #1   Title Pt will verbalize understanding of fall prevention within home environment.  TARGET  03/06/15  (GOAL COMPLETED)   Baseline 04/01/15: met today   Status Achieved   PT LONG TERM GOAL #2   Title Pt will improve 5x sit<>stand transfers to less than or equal to 15 seconds for improved efficiency and safety with transfers.  (GOAL ONGOING-TARGET 04/09/15)   Baseline 18.90 sec on 03/05/15; met on 04/01/15 14.19 sec's    Status Achieved   PT LONG TERM GOAL #3   Title Pt will improve TUG manual to less than or equal to 14.5 seconds for decreased fall risk/improved dual tasking.  (GOAL ONGOING-TARGET 04/09/15)  Baseline 16.63 sec. 2015/04/07: met today with 13.12   Time --   Period --   Status Achieved   PT LONG TERM GOAL #4   Title Pt will improve gait velocity to at least 2.3 ft/sec for imrpoved gait efficiency and safety.  (GOAL MET-COMPLETED)   Baseline 2.3 ft/sec. April 07, 2015: 12.22 sec's = 2.68 ft/sec with no AD   Time --   Period --   Status Achieved   PT LONG TERM GOAL #5   Title Pt will improve Functional Gait Assessment to at least 20/30 for decreased fall risk.  GOAL MODIFIED-TARGET 04/09/15   Baseline 17/30 03/05/15. 07-Apr-2015: met with score of 25/30 today.   Time --   Period --   Status Achieved   PT LONG TERM GOAL #6   Title Pt will verbalize plans for ongoing community fitness upon D/C from PT.  TARGET 04/09/15   Baseline met 03/21/15: issued information on silver sneakers   Status Achieved           Plan - 04-07-15 1405    Clinical Impression Statement Pt has met all LTGs and is agreeable to discharge today.   Pt will benefit from skilled therapeutic intervention in order to improve on the following deficits Abnormal gait;Decreased balance;Decreased mobility;Decreased strength;Difficulty walking;Postural dysfunction   Rehab Potential Good   PT Frequency 2x / week   PT Duration 4 weeks  recert 04/07/00 visit   PT Treatment/Interventions ADLs/Self Care Home Management;Therapeutic exercise;Therapeutic activities;Functional mobility training;Gait training;Balance training;Neuromuscular re-education;Patient/family education   PT Next Visit Plan discharge per PT plan of care   Consulted and Agree with Plan of Care Patient          G-Codes - 2015-04-07 1428    Functional Assessment Tool Used TUG 10.13 sec's, manual TUG 13.12, FGA 25/30, 10 meter gait speed 2.68 ft/sec      Problem List Patient  Active Problem List   Diagnosis Date Noted  . Hypercoagulable state ([place]) 02/07/2012  . Embolism ([place]) 02/07/2012  . S/P right TKA 2011/05/28       G-Codes - 04-07-15 1428    Functional Assessment Tool Used TUG 10.13 sec's, manual TUG 13.12, FGA 25/30, 10 meter gait speed 2.68 ft/sec   Functional Limitation Mobility: Walking and moving around   Mobility: Walking and Moving Around Goal Status [phone]) At least 20 percent but less than 40 percent impaired, limited or restricted   Mobility: Walking and Moving Around Discharge Status [phone]) At least 20 percent but less than 40 percent impaired, limited or restricted       Willow Ora Apr 07, 2015, 2:41 PM  Willow Ora, PTA, [place] [address] [place], [place] [address] [phone] April 07, 2015, 2:41 PM   Name: [name] [name] MRN: [id number] Date of Birth: [birth year]

## 2015-04-02 DIAGNOSIS — Z471 Aftercare following joint replacement surgery: Secondary | ICD-10-CM | POA: Diagnosis not present

## 2015-04-02 DIAGNOSIS — M25552 Pain in left hip: Secondary | ICD-10-CM | POA: Diagnosis not present

## 2015-04-02 DIAGNOSIS — Z96653 Presence of artificial knee joint, bilateral: Secondary | ICD-10-CM | POA: Diagnosis not present

## 2015-04-02 DIAGNOSIS — M25551 Pain in right hip: Secondary | ICD-10-CM | POA: Diagnosis not present

## 2015-04-06 ENCOUNTER — Encounter: Payer: Self-pay | Admitting: Physical Therapy

## 2015-04-06 NOTE — Therapy (Signed)
[facility] [address] [place], [place], [address] Phone: [phone]   Fax:  [phone]  Patient Details  Name: [name] [name] MRN: [id number] Date of Birth: 04/13/1945 Referring Provider:  No ref. provider found  Encounter Date: 04/06/2015  PHYSICAL THERAPY DISCHARGE SUMMARY  Visits from Start of Care: 16  Current functional level related to goals / functional outcomes:     PT Long Term Goals - 04/01/15 1405    PT LONG TERM GOAL #1   Title Pt will verbalize understanding of fall prevention within home environment.  TARGET  03/06/15  (GOAL COMPLETED)   Baseline 04/01/15: met today   Status Achieved   PT LONG TERM GOAL #2   Title Pt will improve 5x sit<>stand transfers to less than or equal to 15 seconds for improved efficiency and safety with transfers.  (GOAL ONGOING-TARGET 04/09/15)   Baseline 18.90 sec on 03/05/15; met on 04/01/15 14.19 sec's   Status Achieved   PT LONG TERM GOAL #3   Title Pt will improve TUG manual to less than or equal to 14.5 seconds for decreased fall risk/improved dual tasking.  (GOAL ONGOING-TARGET 04/09/15)   Baseline 16.63 sec. 04/01/15: met today with 13.12   Time --   Period --   Status Achieved   PT LONG TERM GOAL #4   Title Pt will improve gait velocity to at least 2.3 ft/sec for imrpoved gait efficiency and safety.  (GOAL MET-COMPLETED)   Baseline 2.3 ft/sec. 04/01/15: 12.22 sec's = 2.68 ft/sec with no AD   Time --   Period --   Status Achieved   PT LONG TERM GOAL #5   Title Pt will improve Functional Gait Assessment to at least 20/30 for decreased fall risk.  GOAL MODIFIED-TARGET 04/09/15   Baseline 17/30 03/05/15. 04/01/15: met with score of 25/30 today.   Time --   Period --   Status Achieved   PT LONG TERM GOAL #6   Title Pt will verbalize plans for ongoing community fitness upon D/C from PT.  TARGET 04/09/15   Baseline met 03/21/15: issued information on silver sneakers   Status  Achieved     Pt has met all long term goals.  Pt has improved gait speed, has decreased fall risk on objective functional tests and measures.    Remaining deficits: Balance, bradykinesia   Education / Equipment: Pt has been educated in HEP, fall prevention, community fitness.  Plan: Patient agrees to discharge.  Patient goals were met. Patient is being discharged due to meeting the stated rehab goals.  ?????  Frazier Butt., PT    Mady Haagensen W. 04/06/2015, 8:08 AM  [facility] [address] [place], [place], [address] Phone: [phone]   Fax:  [phone]

## 2015-04-16 DIAGNOSIS — Z7901 Long term (current) use of anticoagulants: Secondary | ICD-10-CM | POA: Diagnosis not present

## 2015-04-16 DIAGNOSIS — J069 Acute upper respiratory infection, unspecified: Secondary | ICD-10-CM | POA: Diagnosis not present

## 2015-04-16 DIAGNOSIS — I2699 Other pulmonary embolism without acute cor pulmonale: Secondary | ICD-10-CM | POA: Diagnosis not present

## 2015-04-22 DIAGNOSIS — H04123 Dry eye syndrome of bilateral lacrimal glands: Secondary | ICD-10-CM | POA: Diagnosis not present

## 2015-04-22 DIAGNOSIS — H40003 Preglaucoma, unspecified, bilateral: Secondary | ICD-10-CM | POA: Diagnosis not present

## 2015-04-22 DIAGNOSIS — H532 Diplopia: Secondary | ICD-10-CM | POA: Diagnosis not present

## 2015-05-17 ENCOUNTER — Encounter: Payer: Medicare Other | Attending: Physical Medicine & Rehabilitation

## 2015-05-17 ENCOUNTER — Ambulatory Visit (HOSPITAL_BASED_OUTPATIENT_CLINIC_OR_DEPARTMENT_OTHER): Payer: Medicare Other | Admitting: Physical Medicine & Rehabilitation

## 2015-05-17 ENCOUNTER — Encounter: Payer: Self-pay | Admitting: Physical Medicine & Rehabilitation

## 2015-05-17 VITALS — BP 140/78 | HR 90

## 2015-05-17 DIAGNOSIS — Z9889 Other specified postprocedural states: Secondary | ICD-10-CM | POA: Insufficient documentation

## 2015-05-17 DIAGNOSIS — G20C Parkinsonism, unspecified: Secondary | ICD-10-CM | POA: Insufficient documentation

## 2015-05-17 DIAGNOSIS — H409 Unspecified glaucoma: Secondary | ICD-10-CM | POA: Insufficient documentation

## 2015-05-17 DIAGNOSIS — F5104 Psychophysiologic insomnia: Secondary | ICD-10-CM | POA: Diagnosis not present

## 2015-05-17 DIAGNOSIS — Z87891 Personal history of nicotine dependence: Secondary | ICD-10-CM | POA: Insufficient documentation

## 2015-05-17 DIAGNOSIS — Z5181 Encounter for therapeutic drug level monitoring: Secondary | ICD-10-CM | POA: Diagnosis not present

## 2015-05-17 DIAGNOSIS — Z79899 Other long term (current) drug therapy: Secondary | ICD-10-CM | POA: Diagnosis not present

## 2015-05-17 DIAGNOSIS — E034 Atrophy of thyroid (acquired): Secondary | ICD-10-CM

## 2015-05-17 DIAGNOSIS — G20 Parkinson's disease: Secondary | ICD-10-CM

## 2015-05-17 DIAGNOSIS — E7800 Pure hypercholesterolemia, unspecified: Secondary | ICD-10-CM | POA: Insufficient documentation

## 2015-05-17 DIAGNOSIS — I10 Essential (primary) hypertension: Secondary | ICD-10-CM | POA: Insufficient documentation

## 2015-05-17 DIAGNOSIS — M797 Fibromyalgia: Secondary | ICD-10-CM | POA: Diagnosis not present

## 2015-05-17 DIAGNOSIS — E079 Disorder of thyroid, unspecified: Secondary | ICD-10-CM | POA: Insufficient documentation

## 2015-05-17 DIAGNOSIS — M1990 Unspecified osteoarthritis, unspecified site: Secondary | ICD-10-CM | POA: Insufficient documentation

## 2015-05-17 DIAGNOSIS — E038 Other specified hypothyroidism: Secondary | ICD-10-CM | POA: Diagnosis not present

## 2015-05-17 HISTORY — DX: Parkinsonism, unspecified: G20.C

## 2015-05-17 HISTORY — DX: Parkinson's disease: G20

## 2015-05-17 MED ORDER — DULOXETINE HCL 30 MG PO CPEP: 30.0000 mg | ORAL_CAPSULE | Freq: Every day | ORAL | Status: DC

## 2015-05-17 NOTE — Progress Notes (Signed)
Subjective:    Patient ID: [name] [name], female    DOB: May 01, 1945, 70 y.o.   MRN: [id number] Chief complaint pain in her arms and legs, As well as shoulders HPI 70 year old female with past history of Graves' disease, now is hypothyroid due to thiouracil treatments. Patient also has been treated for Parkinson's disease. Recently has gone through physical therapy for balance. She is ambulating with a wheeled walker. She has had 6 falls in the last year at the most recent one was one to 2 weeks ago.   In terms of her history of fibromyalgia and this was diagnosed by a rheumatologist about 6 years ago. She does not recall the name.   She was tried on Lyrica however this caused blurring of vision. Gabapentin caused similar symptoms. Currently on venlafaxine. She does not recall being on duloxetine in the past. She has pain all over her body but mainly in her limbs. She does have pain in the upper back area as well.   Patient is independent with her dressing and her bathing. She still works 40 hours a week at home. She drives. Uses a walker for ambulation. Patient states she doesn't drive if her eyes are acting up. She does see an ophthalmologist Pain Inventory Average Pain 6 Pain Right Now 7 My pain is aching  In the last 24 hours, has pain interfered with the following? General activity 7 Relation with others 7 Enjoyment of life 6 What TIME of day is your pain at its worst? daytime and evening Sleep (in general) Fair  Pain is worse with: walking and standing Pain improves with: rest and heat/ice Relief from Meds: no med  Mobility use a cane use a walker how many minutes can you walk? <5 ability to climb steps?  yes do you drive?  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  yes CT/MRI  Physicians involved in your care Any changes since last visit?  no Neurologist Wells Guiles Tat   Family History  Problem Relation Age of Onset  . Prostate cancer Father   . Stroke Father    Social History   Social History  . Marital Status: Widowed    Spouse Name: N/A  . Number of Children: 2  . Years of Education: N/A   Social History Main Topics  . Smoking status: Former Smoker -- 1.00 packs/day    Quit date: 05/10/1989  . Smokeless tobacco: Never Used  . Alcohol Use: 0.0 oz/week    0 Standard drinks or equivalent per week     Comment: one drink once a month  . Drug Use: No  . Sexual Activity: Not Asked   Other Topics Concern  . None   Social History Narrative   Lives at home alone   Drinks caffeine occasionally    Past Surgical History  Procedure Laterality Date  . Abdominal hysterectomy    . Exploratory laparotomy    . Joint replacement      left knee  7/12  . Colonoscopy    . Cataract extraction Bilateral   . Appendectomy    . Cholecystectomy    . Total knee arthroplasty  05/22/2011    Procedure: TOTAL KNEE ARTHROPLASTY;  Surgeon: Mauri Pole, MD;  Location: [facility];  Service: Orthopedics;  Laterality: Right;   Past Medical History  Diagnosis Date  . Thyroid disease     Hypothyroidism  . Hypercholesterolemia   . Hyperlipidemia   . Chronic insomnia   . Glaucoma   . Fibromyalgia   . Arthritis   . Hypothyroidism     s/p graves disease  . Sinus infection     at present- dr Honor Loh PA aware- was seen there and at PCP 05/10/11  . Peripheral vascular disease ([place])     PULMONARY EMBOLUS x 2- 2011, 2012/ FOLLOWED BY DR ODOGWU-LOV 12/12 EPIC   PT STAES WILL STOP COUMADIN 3/12 and begin LOVONOX 05/17/11 as previously instructed  . Complication of anesthesia     severe itching night of surgery requiring meds- also couldnt swallow- throat "paralyzed""  . Hypertension     PCP Dr Cari Caraway- [place]  05/15/11 with clearance and note on chart,    chest x ray, EKG 12/12 EPIC, eccho 7/11 EPIC  . Kidney stone   . PD (Parkinson's disease) ([place])    BP 140/78 mmHg  Pulse 90  SpO2 98%  Opioid Risk Score:   Fall Risk Score:  `1  Depression screen PHQ 2/9  Depression screen PHQ 2/9 05/17/2015  Decreased Interest 1  Down, Depressed, Hopeless 0  PHQ - 2 Score 1  Altered sleeping 2  Tired, decreased energy 1  Change in appetite 2  Feeling bad or failure about yourself  0  Trouble concentrating 0  Moving slowly or fidgety/restless 0  Suicidal thoughts 0  PHQ-9 Score 6  Difficult doing work/chores Somewhat difficult     Review of Systems  Constitutional: Positive for diaphoresis and unexpected weight change.  Cardiovascular: Positive for leg swelling.  Gastrointestinal: Positive for constipation.  Hematological: Bruises/bleeds easily.  All other systems reviewed and are negative.      Objective:   Physical Exam  Constitutional: She is oriented to person, place, and time. She appears well-developed and well-nourished.  HENT:  Head: Normocephalic and atraumatic.  Eyes: Conjunctivae and EOM are normal. Pupils are equal, round, and reactive to light.  Neck: Normal range of motion.  Musculoskeletal:       Right shoulder: She exhibits tenderness. She exhibits normal range of motion, no bony tenderness and no deformity.       Left shoulder: She exhibits tenderness and spasm. She exhibits no bony tenderness and no deformity.       Right elbow: She exhibits normal range of motion, no effusion and no deformity. Tenderness found. Lateral epicondyle tenderness noted.       Left elbow: She exhibits normal range of motion, no effusion and no deformity. Tenderness found. Lateral epicondyle tenderness noted.       Right wrist: Normal.       Left wrist: Normal.       Right hip: She exhibits tenderness and bony tenderness. She exhibits normal range of motion.       Left hip: She  exhibits decreased strength and tenderness. She exhibits normal range of motion and no deformity.       Right knee: She exhibits LCL laxity and MCL laxity. She exhibits no effusion.       Left knee: She exhibits LCL laxity and MCL laxity. She exhibits no effusion. No tenderness found.       Right ankle: Normal.       Left ankle: Normal.       Cervical back: She exhibits decreased range of  motion and tenderness. She exhibits no deformity.       Thoracic back: She exhibits decreased range of motion and tenderness. She exhibits no bony tenderness and no deformity.       Lumbar back: She exhibits decreased range of motion and tenderness. She exhibits no deformity and no spasm.       Right foot: Normal.       Left foot: Normal.  There is tenderness palpation in 18/18 fibromyalgia tender points  Cervical thoracic and lumbar spinal range of motion limited to about 50% flexion extension lateral bending and rotation  Neurological: She is alert and oriented to person, place, and time. She has normal strength. She displays no tremor. No cranial nerve deficit or sensory deficit. She exhibits abnormal muscle tone. Gait abnormal.  5/5 strength bilateral deltoid, biceps, triceps, grip, hip flexor, knee extensor, ankle dorsiflexor and plantar flexor Sensation intact to light touch and pinprick bilateral upper and lower limbs  There is increased rigidity in bilateral upper limbs no evidence of tremor no evidence of dysmetria  No evidence of nystagmus  Gait is using a rolling walker  Psychiatric: She has a normal mood and affect.  Nursing note and vitals reviewed.         Assessment & Plan:  1. Fibromyalgia syndrome diagnosed by rheumatology approximately 6 years ago, agree with this diagnosis.She has chronic widespread pain with multiple tender areas. Patient did not tolerate either gabapentin or Lyrica. She has not trialed Cymbalta. She is currently on venlafaxine 75 mg per day, as we discussed she  would need to discontinue venlafaxine prior to starting Cymbalta. This can be done without a wean given relatively low doses. We'll discontinue venlafaxine today and start the Cymbalta tomorrow  We discussed activity is very important to help with viral symptoms. She is doing her exercise program from PT which includes both upper and lower extremity exercises on a daily basis.  Her exercise activities will be limited due to her Parkinson's disease. She has balance issues and requires a wheeled walker.  We discussed that one Cymbalta started in May need to be increased to 60 mg after one month. We also discussed other medication such as tramadol which may be used judiciously and accommodation with Cymbalta.  If she has improvements with her widespread body pain and has more discrete trigger point areas she may benefit from trigger point injection.  Discussed with patient agrees with plan

## 2015-05-17 NOTE — Patient Instructions (Signed)
Myofascial Pain Syndrome and Fibromyalgia Myofascial pain syndrome and fibromyalgia are both pain disorders. This pain may be felt mainly in your muscles.   Myofascial pain syndrome:  Always has trigger points or tender points in the muscle that will cause pain when pressed. The pain may come and go.  Usually affects your neck, upper back, and shoulder areas. The pain often radiates into your arms and hands.  Fibromyalgia:  Has muscle pains and tenderness that come and go.  Is often associated with fatigue and sleep disturbances.  Has trigger points.  Tends to be long-lasting (chronic), but is not life-threatening. Fibromyalgia and myofascial pain are not the same. However, they often occur together. If you have both conditions, each can make the other worse. Both are common and can cause enough pain and fatigue to make day-to-day activities difficult.  CAUSES  The exact causes of fibromyalgia and myofascial pain are not known. People with certain gene types may be more likely to develop fibromyalgia. Some factors can be triggers for both conditions, such as:   Spine disorders.  Arthritis.  Severe injury (trauma) and other physical stressors.  Being under a lot of stress.  A medical illness. SIGNS AND SYMPTOMS  Fibromyalgia The main symptom of fibromyalgia is widespread pain and tenderness in your muscles. This can vary over time. Pain is sometimes described as stabbing, shooting, or burning. You may have tingling or numbness, too. You may also have sleep problems and fatigue. You may wake up feeling tired and groggy (fibro fog). Other symptoms may include:   Bowel and bladder problems.  Headaches.  Visual problems.  Problems with odors and noises.  Depression or mood changes.  Painful menstrual periods (dysmenorrhea).  Dry skin or eyes. Myofascial pain syndrome Symptoms of myofascial pain syndrome include:   Tight, ropy bands of muscle.   Uncomfortable  sensations in muscular areas, such as:  Aching.  Cramping.  Burning.  Numbness.  Tingling.   Muscle weakness.  Trouble moving certain muscles freely (range of motion). DIAGNOSIS  There are no specific tests to diagnose fibromyalgia or myofascial pain syndrome. Both can be hard to diagnose because their symptoms are common in many other conditions. Your health care provider may suspect one or both of these conditions based on your symptoms and medical history. Your health care provider will also do a physical exam.  The key to diagnosing fibromyalgia is having pain, fatigue, and other symptoms for more than three months that cannot be explained by another condition.  The key to diagnosing myofascial pain syndrome is finding trigger points in muscles that are tender and cause pain elsewhere in your body (referred pain). TREATMENT  Treating fibromyalgia and myofascial pain often requires a team of health care providers. This usually starts with your primary provider and a physical therapist. You may also find it helpful to work with alternative health care providers, such as massage therapists or acupuncturists. Treatment for fibromyalgia may include medicines. This may include nonsteroidal anti-inflammatory drugs (NSAIDs), along with other medicines.  Treatment for myofascial pain may also include:  NSAIDs.  Cooling and stretching of muscles.  Trigger point injections.  Sound wave (ultrasound) treatments to stimulate muscles. HOME CARE INSTRUCTIONS   Take medicines only as directed by your health care provider.  Exercise as directed by your health care provider or physical therapist.  Try to avoid stressful situations.  Practice relaxation techniques to control your stress. You may want to try:  Biofeedback.  Visual imagery.  Hypnosis.  Muscle relaxation.  Yoga.  Meditation.  Talk to your health care provider about alternative treatments, such as acupuncture or  massage treatment.  Maintain a healthy lifestyle. This includes eating a healthy diet and getting enough sleep.  Consider joining a support group.  Do not do activities that stress or strain your muscles. That includes repetitive motions and heavy lifting. SEEK MEDICAL CARE IF:   You have new symptoms.  Your symptoms get worse.  You have side effects from your medicines.  You have trouble sleeping.  Your condition is causing depression or anxiety. FOR MORE INFORMATION   National Fibromyalgia Association: http://www.fmaware.orgwww.fmaware.[place]: http://www.arthritis.orgwww.arthritis.org  American Chronic Pain Association: StreetWrestling.at.https://stevens.biz/   This information is not intended to replace advice given to you by your health care provider. Make sure you discuss any questions you have with your health care provider.   Document Released: 02/20/2005 Document Revised: 03/13/2014 Document Reviewed: 11/26/2013 Elsevier Interactive Patient Education 2016 [employer].   May try Gluten Free diet

## 2015-05-22 LAB — TOXASSURE SELECT,+ANTIDEPR,UR: PDF: 0

## 2015-05-27 NOTE — Progress Notes (Addendum)
Urine drug screen for this encounter is consistent . Ambien present but has historically been prescribed this medication.

## 2015-05-31 DIAGNOSIS — I2699 Other pulmonary embolism without acute cor pulmonale: Secondary | ICD-10-CM | POA: Diagnosis not present

## 2015-05-31 DIAGNOSIS — Z7901 Long term (current) use of anticoagulants: Secondary | ICD-10-CM | POA: Diagnosis not present

## 2015-06-03 ENCOUNTER — Telehealth: Payer: Self-pay | Admitting: *Deleted

## 2015-06-03 NOTE — Telephone Encounter (Signed)
Would recommend that the patient is on Cymbalta 30 mg for one month prior to increasing the dose to make sure we had the full effect

## 2015-06-03 NOTE — Telephone Encounter (Signed)
[name] [name] is calling asking for her cymbalta to be increased to the 60 mg you discussed at last visit.  Please advise.

## 2015-06-04 NOTE — Telephone Encounter (Signed)
Pt advised.

## 2015-06-07 ENCOUNTER — Encounter: Payer: Self-pay | Admitting: Physical Medicine & Rehabilitation

## 2015-06-07 ENCOUNTER — Ambulatory Visit (HOSPITAL_BASED_OUTPATIENT_CLINIC_OR_DEPARTMENT_OTHER): Payer: Medicare Other | Admitting: Physical Medicine & Rehabilitation

## 2015-06-07 ENCOUNTER — Encounter: Payer: Medicare Other | Attending: Physical Medicine & Rehabilitation

## 2015-06-07 DIAGNOSIS — Z87891 Personal history of nicotine dependence: Secondary | ICD-10-CM | POA: Diagnosis not present

## 2015-06-07 DIAGNOSIS — Z9889 Other specified postprocedural states: Secondary | ICD-10-CM | POA: Insufficient documentation

## 2015-06-07 DIAGNOSIS — E7800 Pure hypercholesterolemia, unspecified: Secondary | ICD-10-CM | POA: Diagnosis not present

## 2015-06-07 DIAGNOSIS — M797 Fibromyalgia: Secondary | ICD-10-CM

## 2015-06-07 DIAGNOSIS — H409 Unspecified glaucoma: Secondary | ICD-10-CM | POA: Insufficient documentation

## 2015-06-07 DIAGNOSIS — E079 Disorder of thyroid, unspecified: Secondary | ICD-10-CM | POA: Diagnosis not present

## 2015-06-07 DIAGNOSIS — M1990 Unspecified osteoarthritis, unspecified site: Secondary | ICD-10-CM | POA: Insufficient documentation

## 2015-06-07 DIAGNOSIS — I10 Essential (primary) hypertension: Secondary | ICD-10-CM | POA: Diagnosis not present

## 2015-06-07 DIAGNOSIS — M545 Low back pain, unspecified: Secondary | ICD-10-CM

## 2015-06-07 DIAGNOSIS — F5104 Psychophysiologic insomnia: Secondary | ICD-10-CM | POA: Diagnosis not present

## 2015-06-07 DIAGNOSIS — G8929 Other chronic pain: Secondary | ICD-10-CM

## 2015-06-07 MED ORDER — TRAMADOL HCL 50 MG PO TABS: 50.0000 mg | ORAL_TABLET | Freq: Two times a day (BID) | ORAL | Status: DC | PRN

## 2015-06-07 NOTE — Patient Instructions (Addendum)
recommend walking 10 minutes 3 times per day at least every other day  Trial of acupuncture

## 2015-06-07 NOTE — Progress Notes (Signed)
Subjective:    Patient ID: [name] [name], female    DOB: 11/21/1945, 70 y.o.   MRN: [id number]  HPI 70 year old female with history of Parkinson's disease as well as fibromyalgia syndrome who is seen approximate 2 weeks ago in initial consultation. Patient was started on Cymbalta 30 mg a day. Initially she felt like it was helpful but now does not think it's really very helpful. She would like to go up on the dose of this. We discussed that she has only been on this for a couple weeks and needs to give it at least a month before she knows what the effect is of the current dose. She has all over body pain feels like she has the flu but does not have any other flulike symptoms. In addition she has chronic low back pain. She has not had any imaging studies of her low back. She does not have any radiation of her pain to her lower extremities.  No bowel or bladder dysfunction. Pain Inventory Average Pain 7 Pain Right Now 6 My pain is aching and "flu like pain"  In the last 24 hours, has pain interfered with the following? General activity 7 Relation with others 7 Enjoyment of life 7 What TIME of day is your pain at its worst? daytime Sleep (in general) Fair  Pain is worse with: walking, bending and standing Pain improves with: not answered Relief from Meds: 2  Mobility use a cane use a walker ability to climb steps?  yes do you drive?  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  no  Physicians involved in your care Any changes since last visit?  no   Family History  Problem Relation Age of Onset  . Prostate cancer Father   . Stroke Father    Social History   Social History  . Marital Status: Widowed    Spouse Name: N/A  . Number of Children: 2    . Years of Education: N/A   Social History Main Topics  . Smoking status: Former Smoker -- 1.00 packs/day    Quit date: 05/10/1989  . Smokeless tobacco: Never Used  . Alcohol Use: 0.0 oz/week    0 Standard drinks or equivalent per week     Comment: one drink once a month  . Drug Use: No  . Sexual Activity: Not Asked   Other Topics Concern  . None   Social History Narrative   Lives at home alone   Drinks caffeine occasionally    Past Surgical History  Procedure Laterality Date  . Abdominal hysterectomy    . Exploratory laparotomy    . Joint replacement      left knee  7/12  . Colonoscopy    . Cataract extraction Bilateral   . Appendectomy    . Cholecystectomy    . Total knee arthroplasty  05/22/2011    Procedure: TOTAL KNEE ARTHROPLASTY;  Surgeon: Mauri Pole, MD;  Location: [facility];  Service: Orthopedics;  Laterality: Right;   Past Medical History  Diagnosis Date  . Thyroid disease     Hypothyroidism  . Hypercholesterolemia   . Hyperlipidemia   . Chronic insomnia   . Glaucoma   . Fibromyalgia   . Arthritis   . Hypothyroidism     s/p  graves disease  . Sinus infection     at present- dr Honor Loh PA aware- was seen there and at PCP 05/10/11  . Peripheral vascular disease ([place])     PULMONARY EMBOLUS x 2- 2011, 2012/ FOLLOWED BY DR ODOGWU-LOV 12/12 EPIC   PT STAES WILL STOP COUMADIN 3/12 and begin LOVONOX 05/17/11 as previously instructed  . Complication of anesthesia     severe itching night of surgery requiring meds- also couldnt swallow- throat "paralyzed""  . Hypertension     PCP Dr Cari Caraway- [place]  05/15/11 with clearance and note on chart,    chest x ray, EKG 12/12 EPIC, eccho 7/11 EPIC  . Kidney stone   . PD (Parkinson's disease) ([place])    There were no vitals taken for this visit.  Opioid Risk Score:   Fall Risk Score:  `1  Depression screen PHQ 2/9  Depression screen [facility] 2/9 06/07/2015 05/17/2015  Decreased Interest 1 1  Down, Depressed, Hopeless 0 0   PHQ - 2 Score 1 1  Altered sleeping - 2  Tired, decreased energy - 1  Change in appetite - 2  Feeling bad or failure about yourself  - 0  Trouble concentrating - 0  Moving slowly or fidgety/restless - 0  Suicidal thoughts - 0  PHQ-9 Score - 6  Difficult doing work/chores - Somewhat difficult     Review of Systems  Constitutional: Positive for diaphoresis and unexpected weight change.  Respiratory: Positive for cough.   Cardiovascular: Positive for leg swelling.  All other systems reviewed and are negative.      Objective:   Physical Exam  Constitutional: She is oriented to person, place, and time. She appears well-developed and well-nourished.  HENT:  Head: Normocephalic and atraumatic.  Eyes: Conjunctivae are normal. Pupils are equal, round, and reactive to light.  Musculoskeletal:  Tenderness to palpation 18/18 fibromyalgia points  Neurological: She is alert and oriented to person, place, and time. Gait abnormal.  Masked facies Bradycardia akinesia Ambulates with a cane  Psychiatric: She has a normal mood and affect.  Nursing note and vitals reviewed. Motor strength is 5/5 bilateral deltoid, biceps, triceps, grip, hip flexor, knee extensor, ankle dorsiflexor and plantar flexor        Assessment & Plan:  1.Fibromyalgia syndrome. Have encouraged patient to walk 10 minutes 3 times per day at a minimum of every other day. She thinks she can do this at home with her K Would continue current dose of Cymbalta 30 mg a day for another 2 weeks before deciding on dosage adjustment. She can call us to report on her progress. In the meantime we'll prescribe tramadol 50 mg twice a day when necessary  Also she may benefit from acupuncture for her chronic low back pain as well as her fibromyalgia syndrome.  2. Chronic low back pain her exam is really not localizing. She has pain with both flexion and extension. She has no sciatic symptoms. We check imaging study to further  evaluate. She may be a candidate for medial branch blocks however She would need to come off of the warfarin and get the okay for this prior to procedure

## 2015-06-11 ENCOUNTER — Ambulatory Visit: Payer: Medicare Other

## 2015-06-11 ENCOUNTER — Ambulatory Visit: Payer: Medicare Other | Admitting: Physical Medicine & Rehabilitation

## 2015-06-14 DIAGNOSIS — Z7901 Long term (current) use of anticoagulants: Secondary | ICD-10-CM | POA: Diagnosis not present

## 2015-06-30 DIAGNOSIS — M797 Fibromyalgia: Secondary | ICD-10-CM | POA: Diagnosis not present

## 2015-06-30 DIAGNOSIS — G459 Transient cerebral ischemic attack, unspecified: Secondary | ICD-10-CM | POA: Diagnosis not present

## 2015-06-30 DIAGNOSIS — Z23 Encounter for immunization: Secondary | ICD-10-CM | POA: Diagnosis not present

## 2015-06-30 DIAGNOSIS — I10 Essential (primary) hypertension: Secondary | ICD-10-CM | POA: Diagnosis not present

## 2015-06-30 DIAGNOSIS — G903 Multi-system degeneration of the autonomic nervous system: Secondary | ICD-10-CM | POA: Diagnosis not present

## 2015-06-30 DIAGNOSIS — E782 Mixed hyperlipidemia: Secondary | ICD-10-CM | POA: Diagnosis not present

## 2015-06-30 DIAGNOSIS — F339 Major depressive disorder, recurrent, unspecified: Secondary | ICD-10-CM | POA: Diagnosis not present

## 2015-06-30 DIAGNOSIS — E039 Hypothyroidism, unspecified: Secondary | ICD-10-CM | POA: Diagnosis not present

## 2015-06-30 DIAGNOSIS — G4700 Insomnia, unspecified: Secondary | ICD-10-CM | POA: Diagnosis not present

## 2015-06-30 DIAGNOSIS — I2699 Other pulmonary embolism without acute cor pulmonale: Secondary | ICD-10-CM | POA: Diagnosis not present

## 2015-06-30 DIAGNOSIS — R7301 Impaired fasting glucose: Secondary | ICD-10-CM | POA: Diagnosis not present

## 2015-06-30 DIAGNOSIS — Z7901 Long term (current) use of anticoagulants: Secondary | ICD-10-CM | POA: Diagnosis not present

## 2015-07-05 ENCOUNTER — Ambulatory Visit: Payer: Medicare Other | Admitting: Physical Medicine & Rehabilitation

## 2015-07-05 DIAGNOSIS — F339 Major depressive disorder, recurrent, unspecified: Secondary | ICD-10-CM | POA: Diagnosis not present

## 2015-07-05 DIAGNOSIS — E782 Mixed hyperlipidemia: Secondary | ICD-10-CM | POA: Diagnosis not present

## 2015-07-05 DIAGNOSIS — M545 Low back pain: Secondary | ICD-10-CM | POA: Diagnosis not present

## 2015-07-05 DIAGNOSIS — M797 Fibromyalgia: Secondary | ICD-10-CM | POA: Diagnosis not present

## 2015-07-05 DIAGNOSIS — G4700 Insomnia, unspecified: Secondary | ICD-10-CM | POA: Diagnosis not present

## 2015-07-05 DIAGNOSIS — R7301 Impaired fasting glucose: Secondary | ICD-10-CM | POA: Diagnosis not present

## 2015-07-05 DIAGNOSIS — I10 Essential (primary) hypertension: Secondary | ICD-10-CM | POA: Diagnosis not present

## 2015-07-05 DIAGNOSIS — E039 Hypothyroidism, unspecified: Secondary | ICD-10-CM | POA: Diagnosis not present

## 2015-07-05 DIAGNOSIS — E669 Obesity, unspecified: Secondary | ICD-10-CM | POA: Diagnosis not present

## 2015-07-05 DIAGNOSIS — Z86711 Personal history of pulmonary embolism: Secondary | ICD-10-CM | POA: Diagnosis not present

## 2015-07-05 DIAGNOSIS — H409 Unspecified glaucoma: Secondary | ICD-10-CM | POA: Diagnosis not present

## 2015-07-05 DIAGNOSIS — G20 Parkinson's disease: Secondary | ICD-10-CM | POA: Diagnosis not present

## 2015-07-12 ENCOUNTER — Encounter: Payer: Self-pay | Admitting: Physical Medicine & Rehabilitation

## 2015-07-12 ENCOUNTER — Ambulatory Visit (HOSPITAL_BASED_OUTPATIENT_CLINIC_OR_DEPARTMENT_OTHER): Payer: Medicare Other | Admitting: Physical Medicine & Rehabilitation

## 2015-07-12 ENCOUNTER — Encounter: Payer: Medicare Other | Attending: Physical Medicine & Rehabilitation

## 2015-07-12 VITALS — BP 129/68 | HR 87 | Resp 14

## 2015-07-12 DIAGNOSIS — F5104 Psychophysiologic insomnia: Secondary | ICD-10-CM | POA: Insufficient documentation

## 2015-07-12 DIAGNOSIS — I10 Essential (primary) hypertension: Secondary | ICD-10-CM | POA: Insufficient documentation

## 2015-07-12 DIAGNOSIS — E079 Disorder of thyroid, unspecified: Secondary | ICD-10-CM | POA: Diagnosis not present

## 2015-07-12 DIAGNOSIS — H409 Unspecified glaucoma: Secondary | ICD-10-CM | POA: Diagnosis not present

## 2015-07-12 DIAGNOSIS — Z87891 Personal history of nicotine dependence: Secondary | ICD-10-CM | POA: Diagnosis not present

## 2015-07-12 DIAGNOSIS — M797 Fibromyalgia: Secondary | ICD-10-CM | POA: Diagnosis not present

## 2015-07-12 DIAGNOSIS — G20 Parkinson's disease: Secondary | ICD-10-CM | POA: Diagnosis not present

## 2015-07-12 DIAGNOSIS — Z9889 Other specified postprocedural states: Secondary | ICD-10-CM | POA: Insufficient documentation

## 2015-07-12 DIAGNOSIS — E7800 Pure hypercholesterolemia, unspecified: Secondary | ICD-10-CM | POA: Insufficient documentation

## 2015-07-12 DIAGNOSIS — Z96651 Presence of right artificial knee joint: Secondary | ICD-10-CM

## 2015-07-12 DIAGNOSIS — M1990 Unspecified osteoarthritis, unspecified site: Secondary | ICD-10-CM | POA: Diagnosis not present

## 2015-07-12 MED ORDER — TRAMADOL HCL 50 MG PO TABS: 50.0000 mg | ORAL_TABLET | Freq: Two times a day (BID) | ORAL | Status: DC | PRN

## 2015-07-12 MED ORDER — DULOXETINE HCL 60 MG PO CPEP: 60.0000 mg | ORAL_CAPSULE | Freq: Every day | ORAL | Status: DC

## 2015-07-12 NOTE — Progress Notes (Signed)
Subjective:    Patient ID: [name] [name], female    DOB: 12-Feb-1946, 70 y.o.   MRN: [id number]  HPI  70 year old female with history of Parkinson's disease as well as fibromyalgia syndrome who is seen approximate 2 weeks ago in initial consultation. Patient was started on Cymbalta 30 mg a day. Initially she felt like it was helpful but now does not think it's really very helpful. She would like to go up on the dose of this. We discussed that she has only been on this for a couple weeks and needs to give it at least a month before she knows what the effect is of the current dose. She has all over body pain feels like she has the flu but does not have any other flulike symptoms. In addition she has chronic low back pain.  We discussed her low back pain. She cannot give any exacerbating factors. No worsening no radicular pain. Has follow-up visit with neurologist for her Parkinson's management tomorrow.  No falls, remains independent and works but does not drive.  Pain Inventory Average Pain 6 Pain Right Now 6 My pain is dull and aching  In the last 24 hours, has pain interfered with the following? General activity 4 Relation with others 3 Enjoyment of life 5 What TIME of day is your pain at its worst? daytime Sleep (in general) Fair  Pain is worse with: NA Pain improves with: pacing activities and medication Relief from Meds: 6  Mobility walk with assistance use a walker how many minutes can you walk? 1 ability to climb steps?  yes do you drive?  yes Do you have any goals in this area?  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  yes  Neuro/Psych weakness trouble walking dizziness loss of taste or smell  Prior Studies Any changes since last visit?  no  Physicians involved in your care Any changes since last visit?  no   Family History  Problem Relation Age of Onset  . Prostate cancer Father   .  Stroke Father    Social History   Social History  . Marital Status: Widowed    Spouse Name: N/A  . Number of Children: 2  . Years of Education: N/A   Social History Main Topics  . Smoking status: Former Smoker -- 1.00 packs/day    Quit date: 05/10/1989  . Smokeless tobacco: Never Used  . Alcohol Use: 0.0 oz/week    0 Standard drinks or equivalent per week     Comment: one drink once a month  . Drug Use: No  . Sexual Activity: Not Asked   Other Topics Concern  . None   Social History Narrative   Lives at home alone   Drinks caffeine occasionally    Past Surgical History  Procedure Laterality Date  . Abdominal hysterectomy    . Exploratory laparotomy    . Joint replacement      left knee  7/12  . Colonoscopy    . Cataract extraction Bilateral   . Appendectomy    . Cholecystectomy    . Total knee arthroplasty  05/22/2011    Procedure: TOTAL KNEE ARTHROPLASTY;  Surgeon: Mauri Pole, MD;  Location: [facility];  Service: Orthopedics;  Laterality: Right;   Past Medical History  Diagnosis Date  . Thyroid disease     Hypothyroidism  . Hypercholesterolemia   . Hyperlipidemia   .  Chronic insomnia   . Glaucoma   . Fibromyalgia   . Arthritis   . Hypothyroidism     s/p graves disease  . Sinus infection     at present- dr Honor Loh PA aware- was seen there and at PCP 05/10/11  . Peripheral vascular disease ([place])     PULMONARY EMBOLUS x 2- 2011, 2012/ FOLLOWED BY DR ODOGWU-LOV 12/12 EP[facility]   PT STAES WILL STOP COUMADIN 3/12 and begin LOVONOX 05/17/11 as previously instructed  . Complication of anesthesia     severe itching night of surgery requiring meds- also couldnt swallow- throat "paralyzed""  . Hypertension     PCP Dr Cari Caraway- [place]  05/15/11 with clearance and note on chart,    chest x ray, EKG 12/12 EPIC, eccho 7/11 EPIC  . Kidney stone   . PD (Parkinson's disease) ([place])    BP 129/68 mmHg  Pulse 87  Resp 14  SpO2 95%  Opioid Risk Score:   Fall Risk Score:   `1  Depression screen PHQ 2/9  Depression screen [facility] 2/9 06/07/2015 05/17/2015  Decreased Interest 1 1  Down, Depressed, Hopeless 0 0  PHQ - 2 Score 1 1  Altered sleeping - 2  Tired, decreased energy - 1  Change in appetite - 2  Feeling bad or failure about yourself  - 0  Trouble concentrating - 0  Moving slowly or fidgety/restless - 0  Suicidal thoughts - 0  PHQ-9 Score - 6  Difficult doing work/chores - Somewhat difficult      Review of Systems  Constitutional: Positive for appetite change and unexpected weight change.       Loss of taste or smell  Cardiovascular:       Limb swelling   Gastrointestinal: Positive for nausea.  Musculoskeletal: Positive for gait problem.  Neurological: Positive for dizziness and weakness.  All other systems reviewed and are negative.      Objective:   Physical Exam  Constitutional: She is oriented to person, place, and time. She appears well-developed and well-nourished.  HENT:  Head: Normocephalic and atraumatic.  Right Ear: External ear normal.  Left Ear: External ear normal.  Eyes: Conjunctivae and EOM are normal. Pupils are equal, round, and reactive to light.  Neurological: She is alert and oriented to person, place, and time. She displays no tremor. She exhibits normal muscle tone. Gait abnormal.  Reflex Scores:      Patellar reflexes are 2+ on the right side and 2+ on the left side.      Achilles reflexes are 0 on the right side and 0 on the left side. Skin: Skin is warm and dry.  Nursing note and vitals reviewed. Tenderness in bilateral upper trapezius area bilateral low back area bilateral greater trochanter of the hips. Gait is with a rolling walker short steps widened base of support no evidence of toe drag or knee instability Motor strength is 5/5 bilateral deltoid, biceps, triceps, grip, hip flexor, knee extensor, ankle dorsiflexor and plantar flexor Mood and affect are appropriate        Assessment & Plan:  1.  Fibromyalgia syndrome, she's had partial relief with Cymbalta 30 mg per day and has been on this dose for approximately 8 weeks. Recommend increased dose to 60 mg per day. We discussed that this is the maximum dose  For pain. It will take another 8 weeks before we see the full effect.  May continue tramadol 50 twice a day when necessary she does get partial  relief with this medication. We discussed not to increase the dose of tramadol given that she is on a higher dose of Cymbalta and the risk of serotonin syndrome increases.  Continue ambulation 10 minutes 3 times per day with her walker

## 2015-07-12 NOTE — Patient Instructions (Signed)
Serotonin Syndrome  Serotonin is a brain chemical that regulates the nervous system, which includes the brain, spinal cord, and nerves. Serotonin appears to play a role in all types of behavior, including appetite, emotions, movement, thinking, and response to stress. Excessively high levels of serotonin in the body can cause serotonin syndrome, which is a very dangerous condition.  CAUSES  This condition can be caused by taking medicines or drugs that increase the level of serotonin in your body. These include:  · Antidepressant medicines.  · Migraine medicines.  · Certain pain medicines.  · Certain recreational drugs, including ecstasy, LSD, cocaine, and amphetamines.  · Over-the-counter cough or cold medicines that contain dextromethorphan.  · Certain herbal supplements, including St. John's wort, ginseng, and nutmeg.  This condition usually occurs when you take these medicines or drugs in combination, but it can also happen with a high dose of a single medicine or drug.  RISK FACTORS  This condition is more likely to develop in:  · People who have recently increased the dosage of medicine that increases the serotonin level.  · People who just started taking medicine that increases the serotonin level.  SYMPTOMS  Symptoms of this condition usually happens within several hours of a medicine change. Symptoms include:  · Headache.  · Muscle twitching or stiffness.  · Diarrhea.  · Confusion.  · Restlessness or agitation.  · Shivering or goose bumps.  · Loss of muscle coordination.  · Rapid heart rate.  · Sweating.  Severe cases of serotonin syndrome can cause:  · Irregular heartbeat.  · Seizures.  · Loss of consciousness.  · High fever.  DIAGNOSIS  This condition is diagnosed with a medical history and physical exam. You will be asked about your symptoms and your use of medicines and recreational drugs. Your health care provider may also order lab work or additional tests to rule out other causes of your  symptoms.  TREATMENT  The treatment for this condition depends on the severity of your symptoms. For mild cases, stopping the medicine that caused your condition is usually all that is needed. For moderate to severe cases, hospitalization is required to monitor you and to prevent further muscle damage.  HOME CARE INSTRUCTIONS  · Take over-the-counter and prescription medicines only as told by your health care provider. This is important.  · Check with your health care provider before you start taking any new prescriptions, over-the-counter medicines, herbs, or supplements.  · Avoid combining any medicines that can cause this condition to occur.  · Keep all follow-up visits as told by your health care provider. This is important.  · Maintain a healthy lifestyle.    Eat healthy foods.    Get plenty of sleep.    Exercise regularly.    Do not drink alcohol.    Do not use recreational drugs.  SEEK MEDICAL CARE IF:  · Medicines do not seem to be helping.  · Your symptoms do not improve or they get worse.  · You have trouble taking care of yourself.  SEEK IMMEDIATE MEDICAL CARE IF:  · You have worsening confusion, severe headache, chest pain, high fever, seizures, or loss of consciousness.  · You have serious thoughts about hurting yourself or others.  · You experience serious side effects of medicine, such as swelling of your face, lips, tongue, or throat.     This information is not intended to replace advice given to you by your health care provider. Make sure you discuss any questions you   have with your health care provider.     Document Released: 03/30/2004 Document Revised: 07/07/2014 Document Reviewed: 03/05/2014  Elsevier Interactive Patient Education ©2016 [employer]

## 2015-07-13 ENCOUNTER — Encounter: Payer: Self-pay | Admitting: Neurology

## 2015-07-13 ENCOUNTER — Ambulatory Visit (INDEPENDENT_AMBULATORY_CARE_PROVIDER_SITE_OTHER): Payer: Medicare Other | Admitting: Neurology

## 2015-07-13 VITALS — BP 110/62 | HR 98 | Ht 66.0 in | Wt 207.0 lb

## 2015-07-13 DIAGNOSIS — G20 Parkinson's disease: Secondary | ICD-10-CM

## 2015-07-13 DIAGNOSIS — M797 Fibromyalgia: Secondary | ICD-10-CM

## 2015-07-13 DIAGNOSIS — H532 Diplopia: Secondary | ICD-10-CM

## 2015-07-13 MED ORDER — CARBIDOPA-LEVODOPA ER 23.75-95 MG PO CPCR: 1.0000 | ORAL_CAPSULE | Freq: Three times a day (TID) | ORAL | Status: DC

## 2015-07-13 NOTE — Patient Instructions (Addendum)
1.  You have been referred to [facility]. They will call you directly to schedule an appointment.  Please call [phone] if you do not hear from them.   2. Referral made to Dr Sanda Klein for first available appt 11/22/15 at 7:40 am. They have you on a cancellation list. If this is not a good date/time they can be reached at [phone]. They are located at [facility] - 6th floor [facility] in [place].

## 2015-07-13 NOTE — Progress Notes (Signed)
[name] [name] was seen today in the movement disorders clinic for neurologic consultation at the request of MCNEILL,WENDY, MD.   This patient is accompanied in the office by her child who supplements the history.   Prior records made available to me were reviewed.  The patient first saw Dr. [name] in March, 2016 at which point she was complaining about symptoms of diplopia and weakness.  Because of the diplopia she had acetylcholine receptor antibodies that were performed that were negative.  She has had an MRI and MRA of the brain with and without gadolinium.  I reviewed these images.  These were done on 05/21/2014.  This just revealed mild small vessel disease.  She also had a carotid ultrasound with him in March, 2016 revealed less than 50% stenosis bilaterally.  She followed up with him on 07/23/2014 and started on carbidopa/levodopa 25/100, one tablet 3 times per day (8am, 12pm, 5pm, before the meals).  She called back to him and complained about diffuse psain, headaches, depression and nausea.  Although he was not completely convinced that some of these things were not associated with her fibromyalgia and situational depression (her grandson was leaving for college and he serves as a caregiver), it was decided to taper off of her levodopa on 09/16/2014.   She did this and is now off the medication.  She states today that she initially felt great on the medication but then felt that it caused side effects.  Now that she is off of it, however, she c/o difficulty with walking. They also discussed referrals to pain management and psychiatry.  She presents today for a second opinion.  Pt states that diplopia was her first sx and then she began to notice falling.  This started in 04/2013.  The diplopia was horizontal in nature and she initially thought that it was due to lyrica.  The lyrica was d/c and it initally seemed to get better but then it came back.  It gets better if she closes an eye (doesn't  know if it matters which eye she closes).  She has some degree of diplopia daily but mostly at night.  She sees Dr. [name] at [facility] and was told that nothing was wrong with the eyes.    10/29/14 update:  The patient is following up today.  Overall, she states that she is feeling well.  She really hasn't had significant diplopia since our last visit.  She had her DaT scan on 10/14/2014 and while tracer uptake was present in the bilateral attainment, it was decreased on the right putamen relative to the right caudate, which can be seen in early parkinsonian syndromes.  The patient admits that she is still very slow.  She saw a different ophthalmologist [facility] imaging since our last visit and states that the diagnosis of glaucoma was retracted and everything looked well.  She denies any falls since our last visit no hallucinations.  12/10/14 update:  The patient is accompanied by her daughter who supplements the history.  I started the patient on carbidopa/levodopa 25/100 tid last visit.  Today, she states that she thinks that the medication makes her fibromyalgia worse but admits that it could be the weather.  Admits to a fall yesterday.   She was going into the house and there is an odd shaped stair (L shaped landing) and she fell backwards.   She called 911 and she went to [facility] and she had a CT brain and she states that it  was negative.  Had her INR checked and it was 2.6.  Does state that she was vomiting on way on the way to the hospital so they kept her for a few hours.  Still nauseated some today but has been nauseated even prior to that and wonders if the medication contributes.  She did not take any carbidopa/levodopa today.   03/09/14 update:  The patient follows up today.  Last visit, she complained about multiple side effects of levodopa including the fact that she thought that it made her fibromyalgia worse.  Although I told her that I was not convinced that it caused many of these side  effects, we ultimately decided to try rytary instead.  She started with 95 mg 3 times a day and worked up to 145 mg 3 times a day.  She called me on October 20 to state that she was doing well and then called back on November 11 to state that the medication was causing dizziness and diplopia.  She wanted to decrease the medication.  We explained that sometimes the disease can cause the symptoms and so I had her hold the medication for a few days and she stated that the dizziness and diplopia resolved.  She asked again to go back down to the 95 mg 3 times a day, which we ultimately did.  She feels well going to PT.  She is not exercising otherwise.  She asks me for a referral to pain management for her fibromyalgia.  She has not had falls.  No hallucinations.    07/13/15 update:  The patient follows up today.  This patient is accompanied in the office by her daughter who supplements the history.  She is on Rytary, 95 mg 3 times per day.  She doesn't think that it wears off.  It is very expensive but she didn't qualify for the [facility] program.  She did physical therapy since our last visit.  She was referred to pain management with Dr. Letta Pate.  I reviewed his records and appreciate the input.  She was started on Cymbalta and she states that she is doing well with that and ultram.  She felt great yesterday but doesn't today and attributes that to the rain.  No hallucinations.  Still with diplopia.  Daughter asks about referral to neuro-ophth.  PREVIOUS MEDICATIONS: Sinemet  ALLERGIES:   Allergies  Allergen Reactions  . Crestor [Rosuvastatin Calcium] Other (See Comments)    Feeling very bad, aching all over.  . Erythromycin Hives  . Pregabalin Other (See Comments)    Crossed vision.  Marland Kitchen Macrobid [Nitrofurantoin] Hives  . Carbidopa     Headaches, nausea, dizziness  . Gabapentin Other (See Comments)    Blurred vision  . Losartan     CURRENT MEDICATIONS:  Outpatient Encounter Prescriptions as of  07/13/2015  Medication Sig  . acetaminophen (TYLENOL) 325 MG tablet Take 650 mg by mouth every 6 (six) hours as needed.  Marland Kitchen aspirin 81 MG tablet Take 81 mg by mouth daily after breakfast.   . atorvastatin (LIPITOR) 10 MG tablet Take 1 tablet by mouth daily.  Marland Kitchen azelastine (ASTELIN) 0.1 % nasal spray Place 1 spray into both nostrils 2 (two) times daily. Use in each nostril as directed  . B Complex-C (SUPER B COMPLEX PO) Take 1 tablet by mouth at bedtime.   . Calcium Carbonate-Vitamin D (CALCIUM + D PO) Take 2 tablets by mouth at bedtime. 600-200 mg tab.  . Carbidopa-Levodopa ER (RYTARY) 23.75-95 MG CPCR  Take 1 tablet by mouth 3 (three) times daily.  . Cholecalciferol (VITAMIN D3) 5000 UNITS CAPS Take 5,000 Units by mouth daily.   . DULoxetine (CYMBALTA) 60 MG capsule Take 1 capsule (60 mg total) by mouth daily.  . Magnesium 250 MG TABS Take 1 tablet by mouth at bedtime.   . Multiple Vitamin (MULTIVITAMIN) tablet Take 1 tablet by mouth daily.   . Omega-3 Fatty Acids (FISH OIL) 1200 MG CAPS Take 2 capsules by mouth at bedtime.   Marland Kitchen SYNTHROID 150 MCG tablet Take 150 mcg by mouth daily before breakfast.   . telmisartan-hydrochlorothiazide (MICARDIS HCT) 40-12.5 MG per tablet Take 1 tablet by mouth daily.   . traMADol (ULTRAM) 50 MG tablet Take 1 tablet (50 mg total) by mouth every 12 (twelve) hours as needed for moderate pain.  Marland Kitchen warfarin (COUMADIN) 5 MG tablet Take 5 mg by mouth daily. 1 tab Monday, Wednesday, Friday and Saturday. 1/2 tab Tuesday, Thursday and Sunday  . zolpidem (AMBIEN) 10 MG tablet Take 10 mg by mouth at bedtime.    No facility-administered encounter medications on file as of 07/13/2015.    PAST MEDICAL HISTORY:   Past Medical History  Diagnosis Date  . Thyroid disease     Hypothyroidism  . Hypercholesterolemia   . Hyperlipidemia   . Chronic insomnia   . Glaucoma   . Fibromyalgia   . Arthritis   . Hypothyroidism     s/p graves disease  . Sinus infection     at present- dr  Boneta Lucks PA aware- was seen there and at PCP 05/10/11  . Peripheral vascular disease ([facility])     PULMONARY EMBOLUS x 2- 2011, 2012/ FOLLOWED BY DR ODOGWU-LOV 12/12 EPIC   PT STAES WILL STOP COUMADIN 3/12 and begin LOVONOX 05/17/11 as previously instructed  . Complication of anesthesia     severe itching night of surgery requiring meds- also couldnt swallow- throat "paralyzed""  . Hypertension     PCP Dr Gweneth Dimitri- LOV  05/15/11 with clearance and note on chart,    chest x ray, EKG 12/12 EPIC, eccho 7/11 EPIC  . Kidney stone   . PD (Parkinson's disease) ([facility])     PAST SURGICAL HISTORY:   Past Surgical History  Procedure Laterality Date  . Abdominal hysterectomy    . Exploratory laparotomy    . Joint replacement      left knee  7/12  . Colonoscopy    . Cataract extraction Bilateral   . Appendectomy    . Cholecystectomy    . Total knee arthroplasty  05/22/2011    Procedure: TOTAL KNEE ARTHROPLASTY;  Surgeon: Shelda Pal, MD;  Location: [facility];  Service: Orthopedics;  Laterality: Right;    SOCIAL HISTORY:   Social History   Social History  . Marital Status: Widowed    Spouse Name: N/A  . Number of Children: 2  . Years of Education: N/A   Occupational History  . Not on file.   Social History Main Topics  . Smoking status: Former Smoker -- 1.00 packs/day    Quit date: 05/10/1989  . Smokeless tobacco: Never Used  . Alcohol Use: 0.0 oz/week    0 Standard drinks or equivalent per week     Comment: one drink once a month  . Drug Use: No  . Sexual Activity: Not on file   Other Topics Concern  . Not on file   Social History Narrative   Lives at home alone   Drinks caffeine  occasionally     FAMILY HISTORY:   Family Status  Relation Status Death Age  . Mother Deceased 20    peritonitis  . Father Deceased 45    stroke, prostate cancer  . Sister Alive     10 siblings - 1 cerebral palsy  . Brother Alive     ROS:  A complete 10 system review of systems was obtained and  was unremarkable apart from what is mentioned above.  PHYSICAL EXAMINATION:    VITALS:   Filed Vitals:   07/13/15 1306  BP: 110/62  Pulse: 98  Height: 5\' 6"  (1.676 m)  Weight: 207 lb (93.895 kg)    GEN:  The patient appears stated age and is in NAD. HEENT:  Normocephalic, atraumatic.  CV:  RRR Lungs:  CTAB.   Neck/HEME:  There are no carotid bruits bilaterally.  There is decreased range of motion of the neck.  She has difficulty turning the head to the right.  Neurological examination:  Orientation: The patient is alert and oriented x3.  Cranial nerves: There is good facial symmetry.  There is facial hypomimia.   The speech is fluent and clear.  There are no square wave jerks.  She has no difficulty with the guttural sounds.  Soft palate rises symmetrically and there is no tongue deviation. Hearing is intact to conversational tone. Sensation: Sensation is intact to light touch throughout. Motor: Strength is 5/5 in the bilateral upper and lower extremities.   Shoulder shrug is equal and symmetric.  There is no pronator drift.  There are no fasciculations noted.   Movement examination: Tone: There is normal tone in the bilateral upper extremities.  The tone in the lower extremities is normal.  Abnormal movements: None, even with distraction procedures Coordination:  There is minimal decreased toe taps on the L Gait and Station: The patient has no difficulty arising out of a deep-seated chair without the use of the hands.  Stride length is good today but she does have decreased arm swing on the L  ASSESSMENT/PLAN:  1.  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  Her DaT scan demonstrated decreased uptake of the tracer in the right putamen, but it was very mild.   I am not convinced that everything that she reports as "side effects" of levodopa are really side effects.  She  thought that it made her fibromyalgia worse.  She has also thought that rytary 145 mg tid caused diplopia and I also am not convinced that this is from this but is doing well on 95 mg tid and we will leave her on this dose.  She was also given samples today.  Risks, benefits, side effects and alternative therapies were discussed.  The opportunity to ask questions was given and they were answered to the best of my ability.  The patient expressed understanding and willingness to follow the outlined treatment protocols.   -PT referral at her request.  Encouraged stationary bike  -pts daughter wants referral to neuro-opth to see if anything (prisms, etc) that can be done for diplopia.  Doesn't drive because of this. 2.  Fibromyalgia  -much better now that seeing Dr. Letta Pate 3.  .Much greater than 50% of this visit was spent in counseling with the patient.  Total face to face time:  25 min

## 2015-08-06 DIAGNOSIS — I2699 Other pulmonary embolism without acute cor pulmonale: Secondary | ICD-10-CM | POA: Diagnosis not present

## 2015-08-06 DIAGNOSIS — Z7901 Long term (current) use of anticoagulants: Secondary | ICD-10-CM | POA: Diagnosis not present

## 2015-08-18 ENCOUNTER — Ambulatory Visit: Payer: Medicare Other | Admitting: Physical Therapy

## 2015-08-30 ENCOUNTER — Ambulatory Visit: Payer: Medicare Other | Attending: Neurology | Admitting: Physical Therapy

## 2015-08-30 DIAGNOSIS — R293 Abnormal posture: Secondary | ICD-10-CM

## 2015-08-30 DIAGNOSIS — R2681 Unsteadiness on feet: Secondary | ICD-10-CM

## 2015-08-30 DIAGNOSIS — R2689 Other abnormalities of gait and mobility: Secondary | ICD-10-CM

## 2015-08-30 DIAGNOSIS — R29818 Other symptoms and signs involving the nervous system: Secondary | ICD-10-CM

## 2015-08-31 NOTE — Therapy (Signed)
[facility] [address] [place], [place], [address] Phone: [phone]   Fax:  [phone]  Physical Therapy Evaluation  Patient Details  Name: [name] [name] MRN: [id number] Date of Birth: August 17, 1945 Referring Provider: Wells Guiles Tat  Encounter Date: 08/30/2015      PT End of Session - 08/31/15 1311    Visit Number 1   Number of Visits 17   Date for PT Re-Evaluation 10/29/15   Authorization Type Medicare Primary, AARP 2nd-GCODE every 10th visit   PT Start Time 1155   PT Stop Time 1235   PT Time Calculation (min) 40 min   Equipment Utilized During Treatment Gait belt   Activity Tolerance Patient tolerated treatment well      Past Medical History  Diagnosis Date  . Thyroid disease     Hypothyroidism  . Hypercholesterolemia   . Hyperlipidemia   . Chronic insomnia   . Glaucoma   . Fibromyalgia   . Arthritis   . Hypothyroidism     s/p graves disease  . Sinus infection     at present- dr Honor Loh PA aware- was seen there and at PCP 05/10/11  . Peripheral vascular disease ([place])     PULMONARY EMBOLUS x 2- 2011, 2012/ FOLLOWED BY DR ODOGWU-LOV 12/12 EPIC   PT STAES WILL STOP COUMADIN 3/12 and begin LOVONOX 05/17/11 as previously instructed  . Complication of anesthesia     severe itching night of surgery requiring meds- also couldnt swallow- throat "paralyzed""  . Hypertension     PCP Dr Cari Caraway- [place]  05/15/11 with clearance and note on chart,    chest x ray, EKG 12/12 EPIC, eccho 7/11 EPIC  . Kidney stone   . PD (Parkinson's disease) [facility])     Past Surgical History  Procedure Laterality Date  . Abdominal hysterectomy    . Exploratory laparotomy    . Joint replacement      left knee  7/12  . Colonoscopy    . Cataract extraction Bilateral   . Appendectomy    . Cholecystectomy    . Total knee arthroplasty  05/22/2011    Procedure: TOTAL KNEE ARTHROPLASTY;  Surgeon: Mauri Pole, MD;  Location: [facility];   Service: Orthopedics;  Laterality: Right;    There were no vitals filed for this visit.       Subjective Assessment - 08/30/15 1158    Subjective Pt is a 70 year old female who presents to OP PT with noted increased slowness of movement, having some double vision/blurry vision.  Pt reports 6 falls in the past 6 months. Difficulty getting up when she falls.  Uses cane to come into therapy; has rollator walker in car, which she normally uses.   Pertinent History Fibromyalgia   Patient Stated Goals Pt's goals for therapy include get rid of Parkinson's disease.   Currently in Pain? Yes   Pain Score 6    Pain Location Back   Pain Orientation Left;Lower   Pain Descriptors / Indicators Aching;Dull   Pain Type Chronic pain;Acute pain   Pain Onset In the past 7 days   Aggravating Factors  Unsure   Pain Relieving Factors Bio-freeze   Effect of Pain on Daily Activities PT will monitor and will attempt to address through posture, positioning, exercises.            [facility] PT Assessment - 08/30/15 1204    Assessment   Medical Diagnosis Parkinsonism-MSA   Referring Provider Wells Guiles Tat  Onset Date/Surgical Date 07/13/15   Precautions   Precautions Fall   Balance Screen   Has the patient fallen in the past 6 months Yes  Feels falls are uncontrolled-forward or to the side   How many times? 6   Has the patient had a decrease in activity level because of a fear of falling?  Yes   Is the patient reluctant to leave their home because of a fear of falling?  Yes   [place] residence   Living Arrangements Alone   Available Help at Discharge Family   Type of [place] to enter   Entrance Stairs-Number of Steps 13  from garage   [place] Multi-level;Bed/bath upstairs  garage on lower floor   [place] - single point;Other (comment);Bedside commode;Shower seat;Grab bars - Engineer, mining   Prior Function   Level of Independence Independent with basic ADLs;Independent with household mobility with device  uses rollator at home   Vocation Full time employment   Vocation Requirements [employer]; works from home at computer   Leisure Helps care for grandchildren, enjoys yard work   Haematologist Postural limitations   Postural Limitations Forward head;Rounded Shoulders;Weight shift left   Posture Comments Lateral lean in lower trunk more towards left; left shoulder lower than R shoulder; ?leg length difference   ROM / Strength   AROM / PROM / Strength Strength   Strength   Overall Strength Comments Grossly tested at least 4/5   Transfers   Transfers Sit to Stand;Stand to Sit   Sit to Stand 6: Modified independent (Device/Increase time);Without upper extremity assist;From chair/3-in-1   Five time sit to stand comments  23.36  Increased time for first rep of sit<>stand   Stand to Sit 6: Modified independent (Device/Increase time);Without upper extremity assist;To chair/3-in-1   Comments Pt prefers to use hands for sit<>stand, reports feeling weak in the knees with transfers   Ambulation/Gait   Ambulation/Gait Yes   Ambulation/Gait Assistance 5: Supervision   Ambulation/Gait Assistance Details Uses clinic rollator, per pt request during eval   Ambulation Distance (Feet) 200 Feet   Assistive device Rollator   Gait Pattern Step-through pattern;Decreased step length - right;Decreased step length - left;Trunk flexed;Lateral trunk lean to left;Poor foot clearance - left;Poor foot clearance - right  lower trunk lean to L, upper trunk lean to R   Ambulation Surface Level;Indoor   Gait velocity 2.68 ft/sec (18.34 sec)   Standardized Balance Assessment   Standardized Balance Assessment Timed Up and Go Test   Timed Up and Go Test   Normal TUG (seconds) 19.38  rollator; previous bout of therapy:  13.12 sec TUG  man   Manual TUG (seconds) --  unable to perform as pt uses rollator   Cognitive TUG (seconds) 26.12  rollator   High Level Balance   High Level Balance Comments Posterior push and release test:  pt has no balance reaction, would fall if unaided.  Requires max assist of therapist to regain balance.                             PT Short Term Goals - 08/31/15 1553    PT SHORT TERM GOAL #1   Title Pt will be independent with HEP for improved posture, transfers, gait.  TARGET 09/28/15   Time 4  Period Weeks   Status New   PT SHORT TERM GOAL #2   Title Pt will improve 5x sit<>stand trasnfers to less than or equal to 20 seocnds for improved efficiency and safety with transfers.   Time 4   Period Weeks   Status New   PT SHORT TERM GOAL #3   Title Pt will improve TUG score to less than or equal to 16 seconds for decreased fall risk.   Time 4   Period Weeks   Status New   PT SHORT TERM GOAL #4   Title Pt will verbalize at least 3 means to manage pain in low back for improved involvement in functional activities.   Time 4   Period Weeks   Status New           PT Long Term Goals - 08/31/15 1556    PT LONG TERM GOAL #1   Title Pt will verbalize understanding of fall prevention and tips to reduce freezing of gait within home environment.  TARGET 10/29/15   Time 8   Period Weeks   Status New   PT LONG TERM GOAL #2   Title Pt will report at least 25% improvement in bed mobility.   Time 8   Period Weeks   Status New   PT LONG TERM GOAL #3   Title Pt will improve TUG cognitive to less than or equal to 20 seconds for decreased fall risk/improved ability for dual tasking.   Time 8   Period Weeks   Status New   PT LONG TERM GOAL #4   Title Pt will perform floor>stand transfer with UE support, modified independently, for safe fall recovery.   Time 8   Period Weeks   Status New               Plan - 08/31/15 1314    Clinical Impression Statement Pt is  a 71 year old female who presents to OP PT with diagnosis of Parkinsonism/MSA, with history of 6 falls in the past 6 months.  She presents with decreased timing and coordination of gait, difficulty with transfers, decreased functional strength, decreased balance, posture abnormality, history of falls, bradykinesia.  These deficits are impacting her ability to participate in activities caring for her grandchildren. Pt is at fall risk per TUG scores, and overall pt's scores have slowed since discharge from PT in January 2017.  Pt would benefit from skilled physical therapy to address the above stated deficits to improve functional mobility and decrease fall risk.    Rehab Potential Good   PT Frequency 2x / week   PT Duration 8 weeks  plus eval   PT Treatment/Interventions ADLs/Self Care Home Management;Therapeutic exercise;Functional mobility training;Therapeutic activities;Gait training;Balance training;Neuromuscular re-education;Patient/family education   PT Next Visit Plan Discuss fall prevention, update/review HEP from previous bout of therapy, work on sit<>stand transfers   Consulted and Agree with Plan of Care Patient      Patient will benefit from skilled therapeutic intervention in order to improve the following deficits and impairments:  Abnormal gait, Decreased activity tolerance, Decreased balance, Decreased mobility, Decreased strength, Difficulty walking, Postural dysfunction, Pain  Visit Diagnosis: Other abnormalities of gait and mobility  Abnormal posture  Unsteadiness on feet  Other symptoms and signs involving the nervous system      G-Codes - 09-19-2015 1600    Functional Assessment Tool Used 5x sit<>stand 23.36 sec, TUG 19.38 sec, TUG cognitive 26.12 sec, gait velocity 2.68 ft/sec; 6 falls in the past  6 months   Functional Limitation Mobility: Walking and moving around   Mobility: Walking and Moving Around Current Status [phone]) At least 40 percent but less than 60 percent  impaired, limited or restricted   Mobility: Walking and Moving Around Goal Status [phone]) At least 20 percent but less than 40 percent impaired, limited or restricted       Problem List Patient Active Problem List   Diagnosis Date Noted  . Chronic low back pain 06/07/2015  . Fibromyalgia syndrome 05/17/2015  . Hypothyroidism 05/17/2015  . Parkinson's disease ([place]) 05/17/2015  . Hypercoagulable state ([place]) 02/07/2012  . Embolism ([place]) 02/07/2012  . S/P right TKA 05/22/2011    Bettyjane Shenoy W. 08/31/2015, 4:01 PM  Frazier Butt., PT [facility] [address] [place], [place], [address] Phone: [phone]   Fax:  [phone]  Name: Mackensie Billen MRN: [id number] Date of Birth: 1945-05-04

## 2015-09-03 ENCOUNTER — Ambulatory Visit: Payer: Medicare Other | Admitting: Physical Therapy

## 2015-09-03 ENCOUNTER — Telehealth: Payer: Self-pay | Admitting: Neurology

## 2015-09-03 DIAGNOSIS — R2689 Other abnormalities of gait and mobility: Secondary | ICD-10-CM | POA: Diagnosis not present

## 2015-09-03 DIAGNOSIS — R2681 Unsteadiness on feet: Secondary | ICD-10-CM

## 2015-09-03 DIAGNOSIS — R293 Abnormal posture: Secondary | ICD-10-CM | POA: Diagnosis not present

## 2015-09-03 DIAGNOSIS — F32A Depression, unspecified: Secondary | ICD-10-CM

## 2015-09-03 DIAGNOSIS — F4323 Adjustment disorder with mixed anxiety and depressed mood: Secondary | ICD-10-CM

## 2015-09-03 DIAGNOSIS — F329 Major depressive disorder, single episode, unspecified: Secondary | ICD-10-CM

## 2015-09-03 DIAGNOSIS — R29818 Other symptoms and signs involving the nervous system: Secondary | ICD-10-CM | POA: Diagnosis not present

## 2015-09-03 DIAGNOSIS — F411 Generalized anxiety disorder: Secondary | ICD-10-CM

## 2015-09-03 NOTE — Therapy (Signed)
[facility] [address] [place], [place], [address] Phone: [phone]   Fax:  [phone]  Physical Therapy Treatment  Patient Details  Name: [name] [name] MRN: [id number] Date of Birth: 21-Oct-1945 Referring Provider: Lurena Joiner Tat  Encounter Date: 09/03/2015      PT End of Session - 09/03/15 1115    Visit Number 1   Number of Visits 17   Date for PT Re-Evaluation 10/29/15   Authorization Type Medicare Primary, AARP 2nd-GCODE every 10th visit   PT Start Time 1021   PT Stop Time 1103   PT Time Calculation (min) 42 min   Activity Tolerance Patient tolerated treatment well   Behavior During Therapy [facility] for tasks assessed/performed  pt tearful at beginning of session-"difficulty coping with this dx"      Past Medical History  Diagnosis Date  . Thyroid disease     Hypothyroidism  . Hypercholesterolemia   . Hyperlipidemia   . Chronic insomnia   . Glaucoma   . Fibromyalgia   . Arthritis   . Hypothyroidism     s/p graves disease  . Sinus infection     at present- dr Boneta Lucks PA aware- was seen there and at PCP 05/10/11  . Peripheral vascular disease ([facility])     PULMONARY EMBOLUS x 2- 2011, 2012/ FOLLOWED BY DR ODOGWU-LOV 12/12 EPIC   PT STAES WILL STOP COUMADIN 3/12 and begin LOVONOX 05/17/11 as previously instructed  . Complication of anesthesia     severe itching night of surgery requiring meds- also couldnt swallow- throat "paralyzed""  . Hypertension     PCP Dr Gweneth Dimitri- [place]  05/15/11 with clearance and note on chart,    chest x ray, EKG 12/12 EPIC, eccho 7/11 EPIC  . Kidney stone   . PD (Parkinson's disease) [facility])     Past Surgical History  Procedure Laterality Date  . Abdominal hysterectomy    . Exploratory laparotomy    . Joint replacement      left knee  7/12  . Colonoscopy    . Cataract extraction Bilateral   . Appendectomy    . Cholecystectomy    . Total knee arthroplasty  05/22/2011    Procedure:  TOTAL KNEE ARTHROPLASTY;  Surgeon: Shelda Pal, MD;  Location: [facility];  Service: Orthopedics;  Laterality: Right;    There were no vitals filed for this visit.      Subjective Assessment - 09/03/15 1025    Subjective I just am having a hard time coping with this diagnosis.  Just don't know how I can accept this.   Pertinent History Fibromyalgia   Patient Stated Goals Pt's goals for therapy include get rid of Parkinson's disease.   Currently in Pain? Yes   Pain Score 5    Pain Location --  All over pain   Pain Descriptors / Indicators Aching   Pain Type Chronic pain  fibromyalgia                         [facility] Adult PT Treatment/Exercise - 09/03/15 0001    Transfers   Transfers Sit to Stand;Stand to Sit   Sit to Stand 5: Supervision;Without upper extremity assist;From bed;From chair/3-in-1   Sit to Stand Details (indicate cue type and reason) Cues provided for proper technique for sit<>stand, including scooting to edge of mat, and increased forward lean.   Stand to Sit 5: Supervision;Without upper extremity assist;To bed;To chair/3-in-1   Number  of Reps 10 reps;Other sets (comment)  from 20" height and from 18"    Comments Minisquats x 10 reps in addition to repeated sit<>stand for functional lower extremity strengthening.   Exercises   Exercises Other Exercises;Knee/Hip   Other Exercises  seated heel/toe raises x 10 reps   Knee/Hip Exercises: Aerobic   Nustep Level 3, 4 extremities x 8 minutes with cues for >70 steps /minute to increase intensity   Knee/Hip Exercises: Seated   Long Arc Quad AROM;Strengthening;Right;Left;10 reps  cues for 3 second hold   Marching AROM;Strengthening;Right;Left;10 reps  cues for deliberate marching          Self Care:  Pt comes in today to therapy session, somewhat tearful, and states she is having difficulty coping with this diagnosis.  Asked questions about physical daily tasks, as in potential for family or aide  assistance, and pt feels she is doing day to day activities well and does not need physical assistance.  She voices frustration about the prognosis of this disease, how to cope with what to expect from progression, and how to best communicate with family.  PT suggests neuropsychologist as option, and she is agreeable to therapist asking Dr. [name] for referral.      PT Education - 09/03/15 1112    Education provided Yes   Education Details HEP-see instructions; potential for neuropsychologist    Person(s) Educated Patient   Methods Explanation;Demonstration;Handout;Verbal cues   Comprehension Verbalized understanding;Returned demonstration;Verbal cues required          PT Short Term Goals - 08/31/15 1553    PT SHORT TERM GOAL #1   Title Pt will be independent with HEP for improved posture, transfers, gait.  TARGET 09/28/15   Time 4   Period Weeks   Status New   PT SHORT TERM GOAL #2   Title Pt will improve 5x sit<>stand trasnfers to less than or equal to 20 seocnds for improved efficiency and safety with transfers.   Time 4   Period Weeks   Status New   PT SHORT TERM GOAL #3   Title Pt will improve TUG score to less than or equal to 16 seconds for decreased fall risk.   Time 4   Period Weeks   Status New   PT SHORT TERM GOAL #4   Title Pt will verbalize at least 3 means to manage pain in low back for improved involvement in functional activities.   Time 4   Period Weeks   Status New           PT Long Term Goals - 08/31/15 1556    PT LONG TERM GOAL #1   Title Pt will verbalize understanding of fall prevention and tips to reduce freezing of gait within home environment.  TARGET 10/29/15   Time 8   Period Weeks   Status New   PT LONG TERM GOAL #2   Title Pt will report at least 25% improvement in bed mobility.   Time 8   Period Weeks   Status New   PT LONG TERM GOAL #3   Title Pt will improve TUG cognitive to less than or equal to 20 seconds for decreased fall  risk/improved ability for dual tasking.   Time 8   Period Weeks   Status New   PT LONG TERM GOAL #4   Title Pt will perform floor>stand transfer with UE support, modified independently, for safe fall recovery.   Time 8   Period Weeks   Status  New               Plan - 09/03/15 1116    Clinical Impression Statement HEP initiated today to address functional lower extremity strengthening.  Discussed with patient discontinuing previously issued HEP from last bout of therapy (as it contains standing exercises that may not be appropriate for pt at this time).  Pt also appears tearful today due to difficulty coping with this diagnosis and prognosis.  Pt may benefit from neuropsychologist consult.  Pt will continue to benefit from skilled physical therapy to address posture, balance, strengthening, transfers and gait.   Rehab Potential Good   PT Frequency 2x / week   PT Duration 8 weeks  plus eval   PT Treatment/Interventions ADLs/Self Care Home Management;Therapeutic exercise;Functional mobility training;Therapeutic activities;Gait training;Balance training;Neuromuscular re-education;Patient/family education   PT Next Visit Plan Discuss fall prevention, review HEP and sit<>stand transfers; ?check leg length to see if correlation with posture/back pain   Consulted and Agree with Plan of Care Patient      Patient will benefit from skilled therapeutic intervention in order to improve the following deficits and impairments:  Abnormal gait, Decreased activity tolerance, Decreased balance, Decreased mobility, Decreased strength, Difficulty walking, Postural dysfunction, Pain  Visit Diagnosis: Other abnormalities of gait and mobility  Unsteadiness on feet     Problem List Patient Active Problem List   Diagnosis Date Noted  . Chronic low back pain 06/07/2015  . Fibromyalgia syndrome 05/17/2015  . Hypothyroidism 05/17/2015  . Parkinson's disease ([place]) 05/17/2015  . Hypercoagulable  state ([place]) 02/07/2012  . Embolism ([place]) 02/07/2012  . S/P right TKA 05/22/2011    Jodeci Roarty W. 09/03/2015, 11:20 AM  Frazier Butt., PT  [facility] [address] [place], [place], [address] Phone: [phone]   Fax:  [phone]  Name: [name] [name] MRN: [id number] Date of Birth: 12-13-1945

## 2015-09-03 NOTE — Telephone Encounter (Signed)
Patient made aware and given number to schedule appt. [phone]. Referral placed in EPIC.

## 2015-09-03 NOTE — Patient Instructions (Signed)
  Sit to Stand Transfers:  1. Scoot out to the edge of the chair 2. Place your feet flat on the floor, shoulder width apart.  Make sure your feet are tucked just under your knees. 3. Lean forward (nose over toes) with momentum, and stand up tall with your best posture.  If you need to use your arms, use them as a quick boost up to stand. 4. If you are in a low or soft chair, you can lean back and then forward up to stand, in order to get more momentum. 5. Once you are standing, make sure you are looking ahead and standing tall.  To sit down:  1. Back up until you feel the chair behind your legs. 2. Bend at you hips, reaching  Back for you chair, if needed, then slowly squat to sit down on your chair.  Functional Quadriceps: Sit to Stand    Sit on edge of chair or bed., feet flat on floor. Lean forward, and then stand upright, extending knees fully.   Hold for 5 seconds in this tall standing position. Repeat _10___ times per set.  Do __1-2__ sessions per day.  http://orth.exer.us/735   Copyright  VHI. All rights reserved.  KNEE: Extension, Long Arc Quads - Sitting    Raise leg until knee is straight.  Hold for 3 seconds, then relax _10__ reps per set; 1-2 times per day.  Copyright  VHI. All rights reserved.  Seated Alternating Leg Raise (Marching)    Sit in a chair.   Raise bent knee and return. Repeat with other leg, as in marching in place.  Make sure to lift your leg high. Do _1-2__ sets of 10_ repetitions.  Copyright  VHI. All rights reserved.  ANKLE: Pumps    In sitting, or in reclined position, Point toes down, then up. _10-20__ reps per set, _2-3__ sets per day.   Copyright  VHI. All rights reserved.

## 2015-09-03 NOTE — Telephone Encounter (Signed)
-----   Message from [place], DO sent at 09/03/2015 12:47 PM EDT ----- Regarding: FW: Possible neuropsych referral Luvenia Starch will you put this in as phone encounter.  Happy to send her to psychology.  Will you refer her to [place] and she can see the psychologist closest to her in proximity for anxiety/depression/adjustment d/o ----- Message -----    From: Frazier Butt, PT    Sent: 09/03/2015  11:24 AM      To: [place], DO Subject: Possible neuropsych referral                   Hi,  Ms. C[place]all came in tearful today to session-here are the self-care notes from her session today:  Self Care:  Pt comes in today to therapy session, somewhat tearful, and states she is having difficulty coping with this diagnosis.  Therapist asked questions about physical daily tasks, as in potential for family or aide assistance, and pt feels she is doing day to day activities well and does not need physical assistance.  She voices frustration about the prognosis of this disease, how to cope with what to expect from progression, and how to best communicate with family.  PT suggests neuropsychologist as option, and she is agreeable to therapist asking Dr. [name] for referral.  Do you think neuropsych may be beneficial for her?  If so, could you make that referral?  Thanks, Mady Haagensen, PT

## 2015-09-09 ENCOUNTER — Ambulatory Visit
Admission: RE | Admit: 2015-09-09 | Discharge: 2015-09-09 | Disposition: A | Discharge status: Home / Self Care | Payer: Medicare Other | Source: Ambulatory Visit | Attending: Physical Medicine & Rehabilitation | Admitting: Physical Medicine & Rehabilitation

## 2015-09-09 ENCOUNTER — Ambulatory Visit: Payer: Medicare Other | Attending: Neurology | Admitting: Physical Therapy

## 2015-09-09 DIAGNOSIS — G8929 Other chronic pain: Secondary | ICD-10-CM

## 2015-09-09 DIAGNOSIS — R293 Abnormal posture: Secondary | ICD-10-CM | POA: Insufficient documentation

## 2015-09-09 DIAGNOSIS — R2681 Unsteadiness on feet: Secondary | ICD-10-CM | POA: Diagnosis not present

## 2015-09-09 DIAGNOSIS — M545 Low back pain, unspecified: Secondary | ICD-10-CM

## 2015-09-09 DIAGNOSIS — R2689 Other abnormalities of gait and mobility: Secondary | ICD-10-CM | POA: Diagnosis not present

## 2015-09-09 DIAGNOSIS — R29818 Other symptoms and signs involving the nervous system: Secondary | ICD-10-CM | POA: Diagnosis not present

## 2015-09-09 DIAGNOSIS — M47816 Spondylosis without myelopathy or radiculopathy, lumbar region: Secondary | ICD-10-CM | POA: Diagnosis not present

## 2015-09-09 NOTE — Therapy (Signed)
[facility] [address] [place], [place], [address] Phone: [phone]   Fax:  [phone]  Physical Therapy Treatment  Patient Details  Name: [name] [name] MRN: [id number] Date of Birth: 10/28/1945 Referring Provider: Wells Guiles Tat  Encounter Date: 09/09/2015      PT End of Session - 09/09/15 1500    Visit Number 3   Number of Visits 17   Date for PT Re-Evaluation 10/29/15   Authorization Type Medicare Primary, AARP 2nd-GCODE every 10th visit   PT Start Time 1020   PT Stop Time 1104   PT Time Calculation (min) 44 min   Activity Tolerance Patient tolerated treatment well   Behavior During Therapy [facility] for tasks assessed/performed  pt tearful at beginning of session-"difficulty coping with this dx"      Past Medical History  Diagnosis Date  . Thyroid disease     Hypothyroidism  . Hypercholesterolemia   . Hyperlipidemia   . Chronic insomnia   . Glaucoma   . Fibromyalgia   . Arthritis   . Hypothyroidism     s/p graves disease  . Sinus infection     at present- dr Honor Loh PA aware- was seen there and at PCP 05/10/11  . Peripheral vascular disease ([place])     PULMONARY EMBOLUS x 2- 2011, 2012/ FOLLOWED BY DR ODOGWU-LOV 12/12 EPIC   PT STAES WILL STOP COUMADIN 3/12 and begin LOVONOX 05/17/11 as previously instructed  . Complication of anesthesia     severe itching night of surgery requiring meds- also couldnt swallow- throat "paralyzed""  . Hypertension     PCP Dr Cari Caraway- [place]  05/15/11 with clearance and note on chart,    chest x ray, EKG 12/12 EPIC, eccho 7/11 EPIC  . Kidney stone   . PD (Parkinson's disease) [facility])     Past Surgical History  Procedure Laterality Date  . Abdominal hysterectomy    . Exploratory laparotomy    . Joint replacement      left knee  7/12  . Colonoscopy    . Cataract extraction Bilateral   . Appendectomy    . Cholecystectomy    . Total knee arthroplasty  05/22/2011    Procedure:  TOTAL KNEE ARTHROPLASTY;  Surgeon: Mauri Pole, MD;  Location: [facility];  Service: Orthopedics;  Laterality: Right;    There were no vitals filed for this visit.      Subjective Assessment - 09/09/15 1029    Subjective "I am having falls all the time.  I'm having a hard time.  You just dont know how it feels."  Reports one fall since last visit.  Was trying to get to [facility] and got up too early.   Pertinent History Fibromyalgia   Patient Stated Goals Pt's goals for therapy include get rid of Parkinson's disease.   Currently in Pain? Yes   Pain Score 1    Pain Location Other (Comment)  all over   Pain Type Chronic pain  fibromyalgia   Pain Onset More than a month ago   Pain Frequency Constant   Aggravating Factors  Tempature       Educated pt on fall prevention strategies.  Discussed importance of slowing down with transfers to lessen falls and how best to perform stand-pivot transfers. Sit<>stand from mat x 10 x 2 working on techniques discussed in previous session.  Needed verbal and tactile cues for optimal sit<>stand and would quickly revert back to posterior lean without cues.  Performed LAQ and hip flexion bil x 10 on edge of mat Supine quad sets x 10 and provided as HEP In supine measured for leg length discrepancy.  Pt with >1/2" difference with L longer than R side.  Difficult to get exact measurement due to body habitus.        PT Short Term Goals - 08/31/15 1553    PT SHORT TERM GOAL #1   Title Pt will be independent with HEP for improved posture, transfers, gait.  TARGET 09/28/15   Time 4   Period Weeks   Status New   PT SHORT TERM GOAL #2   Title Pt will improve 5x sit<>stand trasnfers to less than or equal to 20 seocnds for improved efficiency and safety with transfers.   Time 4   Period Weeks   Status New   PT SHORT TERM GOAL #3   Title Pt will improve TUG score to less than or equal to 16 seconds for decreased fall risk.   Time 4   Period Weeks   Status  New   PT SHORT TERM GOAL #4   Title Pt will verbalize at least 3 means to manage pain in low back for improved involvement in functional activities.   Time 4   Period Weeks   Status New           PT Long Term Goals - 08/31/15 1556    PT LONG TERM GOAL #1   Title Pt will verbalize understanding of fall prevention and tips to reduce freezing of gait within home environment.  TARGET 10/29/15   Time 8   Period Weeks   Status New   PT LONG TERM GOAL #2   Title Pt will report at least 25% improvement in bed mobility.   Time 8   Period Weeks   Status New   PT LONG TERM GOAL #3   Title Pt will improve TUG cognitive to less than or equal to 20 seconds for decreased fall risk/improved ability for dual tasking.   Time 8   Period Weeks   Status New   PT LONG TERM GOAL #4   Title Pt will perform floor>stand transfer with UE support, modified independently, for safe fall recovery.   Time 8   Period Weeks   Status New               Plan - 09/09/15 1500    Clinical Impression Statement Pt continues to need cues for safety and transfers.  continues with falls and fall risk.  Continue PT per POC.   Rehab Potential Good   PT Frequency 2x / week   PT Duration 8 weeks  plus eval   PT Treatment/Interventions ADLs/Self Care Home Management;Therapeutic exercise;Functional mobility training;Therapeutic activities;Gait training;Balance training;Neuromuscular re-education;Patient/family education   PT Next Visit Plan Follow up on lumbar xray from 4/3 order by Dr Bonney Aid.  Try lift on R to see if back pain decreases during gait.  Low back strengthening/stretches.   Consulted and Agree with Plan of Care Patient      Patient will benefit from skilled therapeutic intervention in order to improve the following deficits and impairments:  Abnormal gait, Decreased activity tolerance, Decreased balance, Decreased mobility, Decreased strength, Difficulty walking, Postural dysfunction,  Pain  Visit Diagnosis: Other abnormalities of gait and mobility  Unsteadiness on feet  Other symptoms and signs involving the nervous system     Problem List Patient Active Problem List   Diagnosis Date Noted  . Chronic  low back pain 06/07/2015  . Fibromyalgia syndrome 05/17/2015  . Hypothyroidism 05/17/2015  . Parkinson's disease ([place]) 05/17/2015  . Hypercoagulable state ([place]) 02/07/2012  . Embolism ([place]) 02/07/2012  . S/P right TKA 05/22/2011    Narda Bonds 09/09/2015, 3:03 PM  [facility] [address] [place], [place], [address] Phone: [phone]   Fax:  [phone]  Name: [name] [name] MRN: [id number] Date of Birth: [birth year]   Narda Bonds, [place] 09/09/2015 3:03 PM Phone: [phone] Fax: [phone]

## 2015-09-09 NOTE — Patient Instructions (Addendum)
Fall Prevention in the Home  Falls can cause injuries and can affect people from all age groups. There are many simple things that you can do to make your home safe and to help prevent falls. WHAT CAN I DO ON THE OUTSIDE OF MY HOME?  Regularly repair the edges of walkways and driveways and fix any cracks.  Remove high doorway thresholds.  Trim any shrubbery on the main path into your home.  Use bright outdoor lighting.  Clear walkways of debris and clutter, including tools and rocks.  Regularly check that handrails are securely fastened and in good repair. Both sides of any steps should have handrails.  Install guardrails along the edges of any raised decks or porches.  Have leaves, snow, and ice cleared regularly.  Use sand or salt on walkways during winter months.  In the garage, clean up any spills right away, including grease or oil spills. WHAT CAN I DO IN THE BATHROOM?  Use night lights.  Install grab bars by the toilet and in the tub and shower. Do not use towel bars as grab bars.  Use non-skid mats or decals on the floor of the tub or shower.  If you need to sit down while you are in the shower, use a plastic, non-slip stool..  Keep the floor dry. Immediately clean up any water that spills on the floor.  Remove soap buildup in the tub or shower on a regular basis.  Attach bath mats securely with double-sided non-slip rug tape.  Remove throw rugs and other tripping hazards from the floor. WHAT CAN I DO IN THE BEDROOM?  Use night lights.  Make sure that a bedside light is easy to reach.  Do not use oversized bedding that drapes onto the floor.  Have a firm chair that has side arms to use for getting dressed.  Remove throw rugs and other tripping hazards from the floor. WHAT CAN I DO IN THE KITCHEN?   Clean up any spills right away.  Avoid walking on wet floors.  Place frequently used items in easy-to-reach places.  If you need to reach for something  above you, use a sturdy step stool that has a grab bar.  Keep electrical cables out of the way.  Do not use floor polish or wax that makes floors slippery. If you have to use wax, make sure that it is non-skid floor wax.  Remove throw rugs and other tripping hazards from the floor. WHAT CAN I DO IN THE STAIRWAYS?  Do not leave any items on the stairs.  Make sure that there are handrails on both sides of the stairs. Fix handrails that are broken or loose. Make sure that handrails are as long as the stairways.  Check any carpeting to make sure that it is firmly attached to the stairs. Fix any carpet that is loose or worn.  Avoid having throw rugs at the top or bottom of stairways, or secure the rugs with carpet tape to prevent them from moving.  Make sure that you have a light switch at the top of the stairs and the bottom of the stairs. If you do not have them, have them installed. WHAT ARE SOME OTHER FALL PREVENTION TIPS?  Wear closed-toe shoes that fit well and support your feet. Wear shoes that have rubber soles or low heels.  When you use a stepladder, make sure that it is completely opened and that the sides are firmly locked. Have someone hold the ladder while you   are using it. Do not climb a closed stepladder.  Add color or contrast paint or tape to grab bars and handrails in your home. Place contrasting color strips on the first and last steps.  Use mobility aids as needed, such as canes, walkers, scooters, and crutches.  Turn on lights if it is dark. Replace any light bulbs that burn out.  Set up furniture so that there are clear paths. Keep the furniture in the same spot.  Fix any uneven floor surfaces.  Choose a carpet design that does not hide the edge of steps of a stairway.  Be aware of any and all pets.  Review your medicines with your healthcare provider. Some medicines can cause dizziness or changes in blood pressure, which increase your risk of falling. Talk  with your health care provider about other ways that you can decrease your risk of falls. This may include working with a physical therapist or trainer to improve your strength, balance, and endurance.   This information is not intended to replace advice given to you by your health care provider. Make sure you discuss any questions you have with your health care provider.   Document Released: 02/10/2002 Document Revised: 07/07/2014 Document Reviewed: 03/27/2014 Elsevier Interactive Patient Education 2016 Elsevier Inc.   Body Positioning    Gently lean trunk forward until nose is over toes. Maintain nose over toes while you come up to standing.   Strengthening: Quadriceps Set    Tighten muscles on top of thighs by pushing knees down into surface. Hold 5 seconds. Repeat 10 times per set. Do 1-2 sessions per day.  http://orth.exer.us/603   Copyright  VHI. All rights reserved.

## 2015-09-13 ENCOUNTER — Ambulatory Visit (HOSPITAL_BASED_OUTPATIENT_CLINIC_OR_DEPARTMENT_OTHER): Payer: Medicare Other | Admitting: Physical Medicine & Rehabilitation

## 2015-09-13 ENCOUNTER — Encounter: Payer: Medicare Other | Attending: Physical Medicine & Rehabilitation

## 2015-09-13 ENCOUNTER — Encounter: Payer: Self-pay | Admitting: Physical Medicine & Rehabilitation

## 2015-09-13 VITALS — BP 125/78 | HR 85 | Resp 14

## 2015-09-13 DIAGNOSIS — M1990 Unspecified osteoarthritis, unspecified site: Secondary | ICD-10-CM | POA: Diagnosis not present

## 2015-09-13 DIAGNOSIS — I10 Essential (primary) hypertension: Secondary | ICD-10-CM | POA: Insufficient documentation

## 2015-09-13 DIAGNOSIS — E079 Disorder of thyroid, unspecified: Secondary | ICD-10-CM | POA: Insufficient documentation

## 2015-09-13 DIAGNOSIS — M797 Fibromyalgia: Secondary | ICD-10-CM | POA: Diagnosis not present

## 2015-09-13 DIAGNOSIS — G20 Parkinson's disease: Secondary | ICD-10-CM

## 2015-09-13 DIAGNOSIS — Z9889 Other specified postprocedural states: Secondary | ICD-10-CM | POA: Insufficient documentation

## 2015-09-13 DIAGNOSIS — M7062 Trochanteric bursitis, left hip: Secondary | ICD-10-CM

## 2015-09-13 DIAGNOSIS — Z87891 Personal history of nicotine dependence: Secondary | ICD-10-CM | POA: Insufficient documentation

## 2015-09-13 DIAGNOSIS — H409 Unspecified glaucoma: Secondary | ICD-10-CM | POA: Diagnosis not present

## 2015-09-13 DIAGNOSIS — M545 Low back pain, unspecified: Secondary | ICD-10-CM

## 2015-09-13 DIAGNOSIS — E7800 Pure hypercholesterolemia, unspecified: Secondary | ICD-10-CM | POA: Insufficient documentation

## 2015-09-13 DIAGNOSIS — F5104 Psychophysiologic insomnia: Secondary | ICD-10-CM | POA: Diagnosis not present

## 2015-09-13 DIAGNOSIS — G8929 Other chronic pain: Secondary | ICD-10-CM

## 2015-09-13 DIAGNOSIS — M7061 Trochanteric bursitis, right hip: Secondary | ICD-10-CM

## 2015-09-13 HISTORY — DX: Trochanteric bursitis, left hip: M70.62

## 2015-09-13 HISTORY — DX: Trochanteric bursitis, right hip: M70.61

## 2015-09-13 MED ORDER — TRAMADOL HCL 50 MG PO TABS: 50.0000 mg | ORAL_TABLET | Freq: Three times a day (TID) | ORAL | Status: DC | PRN

## 2015-09-13 NOTE — Progress Notes (Signed)
Subjective:    Patient ID: [name] [name], female    DOB: 11-25-1945, 70 y.o.   MRN: [id number]  HPI 70 year old female with Parkinson's disease as well as fibromyalgia chronic low back pain. Patient states her back pain is mainly in the lower lumbar area bilateral.  Not radiating into the lower extremities. No new bowel or bladder dysfunction  Pain Inventory Average Pain 6 Pain Right Now 6 My pain is no selection  In the last 24 hours, has pain interfered with the following? General activity 7 Relation with others 7 Enjoyment of life 7 What TIME of day is your pain at its worst? daytime Sleep (in general) Fair  Pain is worse with: walking and standing Pain improves with: rest, medication and injections Relief from Meds: 1  Mobility walk with assistance use a walker ability to climb steps?  yes do you drive?  yes needs help with transfers Do you have any goals in this area?  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  yes  Neuro/Psych bladder control problems weakness trouble walking dizziness confusion depression anxiety  Prior Studies Any changes since last visit?  no x-rays  Physicians involved in your care Any changes since last visit?  no   Family History  Problem Relation Age of Onset  . Prostate cancer Father   . Stroke Father    Social History   Social History  . Marital Status: Widowed    Spouse Name: N/A  . Number of Children: 2  . Years of Education: N/A   Social History Main Topics  . Smoking status: Former Smoker -- 1.00 packs/day    Quit date: 05/10/1989  . Smokeless tobacco: Never Used  . Alcohol Use: 0.0 oz/week    0 Standard drinks or equivalent per week     Comment: one drink once a month  . Drug Use: No  . Sexual Activity: Not Asked   Other Topics Concern  . None   Social History Narrative   Lives at home alone   Drinks caffeine occasionally    Past Surgical History  Procedure Laterality Date  . Abdominal hysterectomy    . Exploratory laparotomy    . Joint replacement      left knee  7/12  . Colonoscopy    . Cataract extraction Bilateral   . Appendectomy    . Cholecystectomy    . Total knee arthroplasty  05/22/2011    Procedure: TOTAL KNEE ARTHROPLASTY;  Surgeon: Mauri Pole, MD;  Location: [facility];  Service: Orthopedics;  Laterality: Right;   Past Medical History  Diagnosis Date  . Thyroid disease     Hypothyroidism  . Hypercholesterolemia   . Hyperlipidemia   . Chronic insomnia   . Glaucoma   . Fibromyalgia   . Arthritis   . Hypothyroidism     s/p graves disease  . Sinus infection     at present- dr Honor Loh PA aware- was seen there and at PCP 05/10/11  . Peripheral vascular disease ([place])     PULMONARY EMBOLUS x 2- 2011, 2012/ FOLLOWED BY DR ODOGWU-LOV 12/12 EPIC   PT STAES WILL STOP COUMADIN 3/12 and begin LOVONOX 05/17/11 as previously instructed  . Complication of anesthesia     severe itching night of surgery requiring meds- also couldnt swallow- throat "paralyzed""  . Hypertension     PCP Dr  Cari Caraway- [place]  05/15/11 with clearance and note on chart,    chest x ray, EKG 12/12 EPIC, eccho 7/11 EPIC  . Kidney stone   . PD (Parkinson's disease) ([place])    BP 125/78 mmHg  Pulse 85  Resp 14  SpO2 95%  Opioid Risk Score:   Fall Risk Score:  `1  Depression screen PHQ 2/9  Depression screen [facility] 2/9 06/07/2015 05/17/2015  Decreased Interest 1 1  Down, Depressed, Hopeless 0 0  PHQ - 2 Score 1 1  Altered sleeping - 2  Tired, decreased energy - 1  Change in appetite - 2  Feeling bad or failure about yourself  - 0  Trouble concentrating - 0  Moving slowly or fidgety/restless - 0  Suicidal thoughts - 0  PHQ-9 Score - 6  Difficult doing work/chores - Somewhat difficult     Review of Systems  Constitutional: Positive for diaphoresis and unexpected weight  change.  HENT: Negative.   Eyes: Negative.   Cardiovascular: Positive for leg swelling.  Gastrointestinal: Positive for constipation.  Endocrine: Negative.   Genitourinary: Positive for difficulty urinating.  Musculoskeletal: Positive for myalgias, back pain, arthralgias and neck pain.  Skin: Negative.   Allergic/Immunologic: Negative.   Neurological: Positive for weakness.  Psychiatric/Behavioral: Positive for confusion and dysphoric mood. The patient is nervous/anxious.   All other systems reviewed and are negative.      Objective:   Physical Exam  Constitutional: She is oriented to person, place, and time. She appears well-developed and well-nourished.  HENT:  Head: Normocephalic and atraumatic.  Eyes: Conjunctivae and EOM are normal. Pupils are equal, round, and reactive to light.  Neurological: She is alert and oriented to person, place, and time. She displays no tremor. Coordination and gait abnormal.  Intact sensation to pinprick bilateral L3-L4 L5-S1 dermatomal distribution  Her strength is 4/5 bilateral hip flexor and knee extensor and ankle dorsiflexor and plantar flexor  Evidence of bradycardia akinesia during manual muscle testing as well as during ambulation  Psychiatric: She has a normal mood and affect.  Nursing note and vitals reviewed.  Bilateral pain to palpation over the trochanteric bursa, no pain with hip range of motion       Assessment & Plan:  1. Chronic low back pain, we reviewed x-rays which mainly showed degenerative changes in the facet joints L4-5 L5-S1 but also marketed disc degeneration at L5-S1. She has no signs of radiculopathy however. We discussed treatment options including lumbar medial branch blocks however she would need to come off of Coumadin. She is taking Coumadin for history of pulmonary embolism in 2012.   If she decides to have the injections she will discuss this with her primary care physician and we'll need to get written  recommendations  For now we will increase tramadol to 50 mg 3 times a day, if this causes increased sweating would reduced to twice a day once again or consider reduction of Cymbalta to 30 mg  2. Fibro-Myalgia syndrome as discussed with patient this is amplifying pain in other locations. Continue Cymbalta 60 mg per day. Patient is having some sweating it is unclear whether this is medication related  At this point. We discussed that we may need to reduce the Cymbalta to 30 mg if this continues to the point where patient does not tolerate  3. Parkinson's disease follow up with neurology, Dr. [name]  4. Bilateral trochanteric bursitis may benefit from repeat injections could not do it any sooner than one more  month from now, patient will call to schedule if she decides to pursue this option

## 2015-09-13 NOTE — Patient Instructions (Signed)
Please try some over-the-counter tens  Unit If you have some partial relief with this you may be able to get PENS  We'll discuss at next visit  If you get excessive sweating from increased tramadol you'll have to reduce once again to twice a day or reduce the Cymbalta to 30 mg

## 2015-09-14 ENCOUNTER — Telehealth: Payer: Self-pay

## 2015-09-14 ENCOUNTER — Ambulatory Visit: Payer: Medicare Other | Admitting: Physical Therapy

## 2015-09-15 ENCOUNTER — Ambulatory Visit (INDEPENDENT_AMBULATORY_CARE_PROVIDER_SITE_OTHER): Payer: Medicare Other | Admitting: Family Medicine

## 2015-09-15 ENCOUNTER — Encounter: Payer: Self-pay | Admitting: Family Medicine

## 2015-09-15 VITALS — BP 129/64 | HR 91 | Temp 98.1°F | Ht 65.0 in | Wt 206.0 lb

## 2015-09-15 DIAGNOSIS — I749 Embolism and thrombosis of unspecified artery: Secondary | ICD-10-CM

## 2015-09-15 DIAGNOSIS — G20 Parkinson's disease: Secondary | ICD-10-CM | POA: Diagnosis not present

## 2015-09-15 DIAGNOSIS — Z7901 Long term (current) use of anticoagulants: Secondary | ICD-10-CM | POA: Diagnosis not present

## 2015-09-15 LAB — POCT INR: INR: 1.9

## 2015-09-15 NOTE — Progress Notes (Signed)
[address] [facility] [address] [place], [place] [address] [phone]  Date:  09/15/2015   Name:  [name] [name]   DOB:  1945/05/19   MRN:  [id number]  PCP:  Lamar Blinks, MD    Chief Complaint: Establish Care   History of Present Illness:  [name] [name] is a 70 y.o. very pleasant female patient who presents with the following:  Here today to establish care- she is a former Animal nutritionist patient.   She states that she has not had "a good physical in ages."   She is on coumadin due to history of PE x2.  However she states she was never dx with any particular coagulation disorder as cause of her PE.  She was recently dx with parkinsons disease and is seeing Dr. [name] (neurology) and Dr. Ella Bodo for her back pain; she is on carbidopa/ levodopa for this disease and also cymbalta, tramadol for pain.   Also history of hypothyroidism   Her last INR check was about a month ago- she is taking coumadin 4 mg every day. Her dose has been stable for some time.  She had a full lab panel in April Fasting for about 5 hours now.   She has used a walker/ cane for about one month due to her parkinsons She is still driving a car  She is on coumadin 28 mg total a week- 4mg  daily  Patient Active Problem List   Diagnosis Date Noted  . Trochanteric bursitis of both hips 09/13/2015  . Chronic low back pain 06/07/2015  . Fibromyalgia syndrome 05/17/2015  . Hypothyroidism 05/17/2015  . Parkinson's disease ([place]) 05/17/2015  . Hypercoagulable state ([place]) 02/07/2012  . Embolism ([place]) 02/07/2012  . S/P right TKA 05/22/2011    Past Medical History  Diagnosis Date  . Thyroid disease     Hypothyroidism  . Hypercholesterolemia   . Hyperlipidemia   . Chronic insomnia   . Glaucoma   . Fibromyalgia   . Arthritis   . Hypothyroidism     s/p graves disease  . Sinus infection     at present- dr Honor Loh PA aware- was seen there and at PCP 05/10/11  .  Peripheral vascular disease ([place])     PULMONARY EMBOLUS x 2- 2011, 2012/ FOLLOWED BY DR ODOGWU-LOV 12/12 EPIC   PT STAES WILL STOP COUMADIN 3/12 and begin LOVONOX 05/17/11 as previously instructed  . Complication of anesthesia     severe itching night of surgery requiring meds- also couldnt swallow- throat "paralyzed""  . Hypertension     PCP Dr Cari Caraway- [place]  05/15/11 with clearance and note on chart,    chest x ray, EKG 12/12 EPIC, eccho 7/11 EPIC  . Kidney stone   . PD (Parkinson's disease) [facility])     Past Surgical History  Procedure Laterality Date  . Abdominal hysterectomy    . Exploratory laparotomy    . Joint replacement      left knee  7/12  . Colonoscopy    . Cataract extraction Bilateral   . Appendectomy    . Cholecystectomy    . Total knee arthroplasty  05/22/2011    Procedure: TOTAL KNEE ARTHROPLASTY;  Surgeon: Mauri Pole, MD;  Location: [facility] ORS;  Service: Orthopedics;  Laterality: Right;    Social History  Substance Use Topics  . Smoking status: Former Smoker -- 1.00 packs/day    Quit date: 05/10/1989  . Smokeless tobacco: Never Used  .  Alcohol Use: 0.0 oz/week    0 Standard drinks or equivalent per week     Comment: one drink once a month    Family History  Problem Relation Age of Onset  . Prostate cancer Father   . Stroke Father     Allergies  Allergen Reactions  . Crestor [Rosuvastatin Calcium] Other (See Comments)    Feeling very bad, aching all over.  . Erythromycin Hives  . Pregabalin Other (See Comments)    Crossed vision.  Marland Kitchen Macrobid [Nitrofurantoin] Hives  . Carbidopa     Headaches, nausea, dizziness  . Gabapentin Other (See Comments)    Blurred vision  . Losartan     Medication list has been reviewed and updated.  Current Outpatient Prescriptions on File Prior to Visit  Medication Sig Dispense Refill  . acetaminophen (TYLENOL) 325 MG tablet Take 650 mg by mouth every 6 (six) hours as needed.    Marland Kitchen aspirin 81 MG tablet Take 81 mg by  mouth daily after breakfast.     . azelastine (ASTELIN) 0.1 % nasal spray Place 1 spray into both nostrils 2 (two) times daily. Use in each nostril as directed    . B Complex-C (SUPER B COMPLEX PO) Take 1 tablet by mouth at bedtime.     . Calcium Carbonate-Vitamin D (CALCIUM + D PO) Take 2 tablets by mouth at bedtime. 600-200 mg tab.    . Carbidopa-Levodopa ER (RYTARY) 23.75-95 MG CPCR Take 1 tablet by mouth 3 (three) times daily. 150 capsule 0  . Cholecalciferol (VITAMIN D3) 5000 UNITS CAPS Take 5,000 Units by mouth daily.     . DULoxetine (CYMBALTA) 60 MG capsule Take 1 capsule (60 mg total) by mouth daily. 30 capsule 5  . Magnesium 250 MG TABS Take 1 tablet by mouth at bedtime.     . Multiple Vitamin (MULTIVITAMIN) tablet Take 1 tablet by mouth daily.     . Omega-3 Fatty Acids (FISH OIL) 1200 MG CAPS Take 2 capsules by mouth at bedtime.     Marland Kitchen SYNTHROID 150 MCG tablet Take 150 mcg by mouth daily before breakfast.     . telmisartan-hydrochlorothiazide (MICARDIS HCT) 40-12.5 MG per tablet Take 1 tablet by mouth daily.   1  . traMADol (ULTRAM) 50 MG tablet Take 1 tablet (50 mg total) by mouth every 8 (eight) hours as needed for moderate pain. 90 tablet 1  . warfarin (COUMADIN) 5 MG tablet Take 4 mg by mouth daily. 1 tab Monday, Wednesday, Friday and Saturday. 1/2 tab Tuesday, Thursday and Sunday    . zolpidem (AMBIEN) 10 MG tablet Take 10 mg by mouth at bedtime.      No current facility-administered medications on file prior to visit.    Review of Systems:  As per HPI- otherwise negative.   Physical Examination:   There were no vitals filed for this visit. Filed Vitals:   09/15/15 1335  Height: 5\' 5"  (1.651 m)  Weight: 206 lb (93.441 kg)   Body mass index is 34.28 kg/(m^2). Ideal Body Weight: Weight in (lb) to have BMI = 25: 149.9  GEN: WDWN, NAD, Non-toxic, A & O x 3, obese, looks well HEENT: Atraumatic, Normocephalic. Neck supple. No masses, No LAD. Ears and Nose: No external  deformity. CV: RRR, No M/G/R. No JVD. No thrill. No extra heart sounds. PULM: CTA B, no wheezes, crackles, rhonchi. No retractions. No resp. distress. No accessory muscle use. EXTR: No c/c/e NEURO Normal gait for pt at this time- she  is using a walker and also has her cane.   PSYCH: Normally interactive. Conversant. Not depressed or anxious appearing.  Calm demeanor.    Assessment and Plan: Embolism ([place]) - Plan: POCT INR  Parkinson's disease ([place])  Chronic anticoagulation  Here today to establish care She also needs an INR check   Results for orders placed or performed in visit on 09/15/15  POCT INR  Result Value Ref Range   INR 1.9    Slightly below range.  Advised her to increase her total weekly dose by 2mg  (approx 10%) by taking an extra 2 mg today, then recheck in 1 week.  She is agreeable to this  Please come and see me in about 2 months for a complete physical Take an extra 1/2 pill of your comadin today, then go back to one pill daily. Schedule a nurse visit in about one week for an INR check We will get your records from [place], [place]

## 2015-09-15 NOTE — Telephone Encounter (Signed)
Pre visit call completed 

## 2015-09-15 NOTE — Patient Instructions (Addendum)
Please come and see me in about 2 months for a complete physical Take an extra 1/2 pill of your comadin today, then go back to one pill daily. Schedule a nurse visit in about one week for an INR check We will get your records from [place]

## 2015-09-15 NOTE — Progress Notes (Signed)
Pre visit review using our clinic review tool, if applicable. No additional management support is needed unless otherwise documented below in the visit note. 

## 2015-09-17 ENCOUNTER — Telehealth: Payer: Self-pay | Admitting: Neurology

## 2015-09-17 ENCOUNTER — Ambulatory Visit: Payer: Medicare Other | Admitting: Physical Therapy

## 2015-09-17 DIAGNOSIS — R293 Abnormal posture: Secondary | ICD-10-CM

## 2015-09-17 DIAGNOSIS — R2689 Other abnormalities of gait and mobility: Secondary | ICD-10-CM

## 2015-09-17 DIAGNOSIS — R29818 Other symptoms and signs involving the nervous system: Secondary | ICD-10-CM | POA: Diagnosis not present

## 2015-09-17 DIAGNOSIS — R2681 Unsteadiness on feet: Secondary | ICD-10-CM | POA: Diagnosis not present

## 2015-09-17 NOTE — Patient Instructions (Signed)
Provided lumbar exercise handouts: -supine pelvic tilts x 10 -supine single knee to chest x 10  -supine trunk rotation x 10 -seated hamstring stretch 3 reps x 15-30 seconds

## 2015-09-17 NOTE — Therapy (Signed)
[facility] [address] [place], [place], [address] Phone: [phone]   Fax:  [phone]  Physical Therapy Treatment  Patient Details  Name: [name] [name] MRN: [id number] Date of Birth: [birth year] Referring Provider: Wells Guiles Tat  Encounter Date: 09/17/2015      PT End of Session - 09/17/15 1217    Visit Number 4   Number of Visits 17   Date for PT Re-Evaluation 10/29/15   Authorization Type Medicare Primary, AARP 2nd-GCODE every 10th visit   PT Start Time 1019   PT Stop Time 1100   PT Time Calculation (min) 41 min   Activity Tolerance Patient tolerated treatment well   Behavior During Therapy [facility] for tasks assessed/performed      Past Medical History  Diagnosis Date  . Thyroid disease     Hypothyroidism  . Hypercholesterolemia   . Hyperlipidemia   . Chronic insomnia   . Glaucoma   . Fibromyalgia   . Arthritis   . Hypothyroidism     s/p graves disease  . Sinus infection     at present- dr Honor Loh PA aware- was seen there and at PCP 05/10/11  . Peripheral vascular disease ([place])     PULMONARY EMBOLUS x 2- 2011, 2012/ FOLLOWED BY DR ODOGWU-LOV 12/12 EPIC   PT STAES WILL STOP COUMADIN 3/12 and begin LOVONOX 05/17/11 as previously instructed  . Complication of anesthesia     severe itching night of surgery requiring meds- also couldnt swallow- throat "paralyzed""  . Hypertension     PCP Dr Cari Caraway- [place]  05/15/11 with clearance and note on chart,    chest x ray, EKG 12/12 EPIC, eccho 7/11 EPIC  . Kidney stone   . PD (Parkinson's disease) [facility])     Past Surgical History  Procedure Laterality Date  . Abdominal hysterectomy    . Exploratory laparotomy    . Joint replacement      left knee  7/12  . Colonoscopy    . Cataract extraction Bilateral   . Appendectomy    . Cholecystectomy    . Total knee arthroplasty  05/22/2011    Procedure: TOTAL KNEE ARTHROPLASTY;  Surgeon: Mauri Pole, MD;  Location: [facility]  [facility];  Service: Orthopedics;  Laterality: Right;    There were no vitals filed for this visit.      Subjective Assessment - 09/17/15 1022    Subjective I feel like maybe the Parkinson's medication is causing the blurred vision.  I haven't really been taking it, but I plan to let Dr. [name] know.  1 fall since PT last visit.   Pertinent History Fibromyalgia   Patient Stated Goals Pt's goals for therapy include get rid of Parkinson's disease.   Currently in Pain? Yes   Pain Score 1    Pain Location --  generalized   Pain Type Chronic pain   Pain Onset More than a month ago   Aggravating Factors  pain comes and goes   Pain Relieving Factors Not taking the Parkinson's medication alleviates                         [facility] Adult PT Treatment/Exercise - 09/17/15 1028    Ambulation/Gait   Ambulation/Gait Yes   Ambulation/Gait Assistance 5: Supervision   Ambulation/Gait Assistance Details Pt brings in and uses cane today.  Gait with 1/4 inch heel wedge in R shoe.  Pt reports feeling no pain with gait, less discomfort  in back with wearing wedge in shoe.  Pt agreeable to keeping wedge in shoe.   Ambulation Distance (Feet) 115 Feet  x 3   Assistive device Straight cane   Gait Pattern Step-through pattern;Decreased step length - right;Decreased step length - left;Poor foot clearance - left;Poor foot clearance - right  decreased lateral lean when wearing wedge   Ambulation Surface Level;Indoor   Exercises   Exercises Lumbar   Lumbar Exercises: Stretches   Active Hamstring Stretch 3 reps;30 seconds  Seated hamstring stretch   Single Knee to Chest Stretch 3 reps;10 seconds   Lower Trunk Rotation 5 reps;20 seconds   Pelvic Tilt Other (comment);3 reps  10 reps   Lumbar Exercises: Supine   Other Supine Lumbar Exercises Hooklying marching x 10 reps      Readjusted heel wedge to be underneath insole of shoe, with pt feeling comfortable and agreeing to keep in her shoe.  Advised  patient if any back or hip pain increases, to take out wedge of shoe.          PT Education - 09/17/15 1216    Education provided Yes   Education Details HEP-see instructions for low back exercises and hamstring stretching   Person(s) Educated Patient   Methods Explanation;Demonstration;Handout   Comprehension Verbalized understanding;Returned demonstration;Verbal cues required          PT Short Term Goals - 08/31/15 1553    PT SHORT TERM GOAL #1   Title Pt will be independent with HEP for improved posture, transfers, gait.  TARGET 09/28/15   Time 4   Period Weeks   Status New   PT SHORT TERM GOAL #2   Title Pt will improve 5x sit<>stand trasnfers to less than or equal to 20 seocnds for improved efficiency and safety with transfers.   Time 4   Period Weeks   Status New   PT SHORT TERM GOAL #3   Title Pt will improve TUG score to less than or equal to 16 seconds for decreased fall risk.   Time 4   Period Weeks   Status New   PT SHORT TERM GOAL #4   Title Pt will verbalize at least 3 means to manage pain in low back for improved involvement in functional activities.   Time 4   Period Weeks   Status New           PT Long Term Goals - 08/31/15 1556    PT LONG TERM GOAL #1   Title Pt will verbalize understanding of fall prevention and tips to reduce freezing of gait within home environment.  TARGET 10/29/15   Time 8   Period Weeks   Status New   PT LONG TERM GOAL #2   Title Pt will report at least 25% improvement in bed mobility.   Time 8   Period Weeks   Status New   PT LONG TERM GOAL #3   Title Pt will improve TUG cognitive to less than or equal to 20 seconds for decreased fall risk/improved ability for dual tasking.   Time 8   Period Weeks   Status New   PT LONG TERM GOAL #4   Title Pt will perform floor>stand transfer with UE support, modified independently, for safe fall recovery.   Time 8   Period Weeks   Status New               Plan -  09/17/15 1217    Clinical Impression Statement Pt responds well  to addition of heel lift to R shoe, with pt not complaining of pain with gait.  Pt would like to continue to use heel wedge in R shoe for improved comfort with gait.  Pt will continue to benefit from further skilled PT to address gait, balance, posture,a nd exercise.   Rehab Potential Good   PT Frequency 2x / week   PT Duration 8 weeks  plus eval   PT Treatment/Interventions ADLs/Self Care Home Management;Therapeutic exercise;Functional mobility training;Therapeutic activities;Gait training;Balance training;Neuromuscular re-education;Patient/family education   PT Next Visit Plan Review low back strengthening and stretches provided this visit.  Reassess R heel lift.  Begin assessing STGs   Consulted and Agree with Plan of Care Patient      Patient will benefit from skilled therapeutic intervention in order to improve the following deficits and impairments:  Abnormal gait, Decreased activity tolerance, Decreased balance, Decreased mobility, Decreased strength, Difficulty walking, Postural dysfunction, Pain  Visit Diagnosis: Abnormal posture  Other abnormalities of gait and mobility     Problem List Patient Active Problem List   Diagnosis Date Noted  . Trochanteric bursitis of both hips 09/13/2015  . Chronic low back pain 06/07/2015  . Fibromyalgia syndrome 05/17/2015  . Hypothyroidism 05/17/2015  . Parkinson's disease ([place]) 05/17/2015  . Hypercoagulable state ([place]) 02/07/2012  . Embolism ([place]) 02/07/2012  . S/P right TKA 05/22/2011    Juanita Devincent W. 09/17/2015, 12:24 PM  Frazier Butt., PT  [facility] [address] [place], [place], [address] Phone: [phone]   Fax:  [phone]  Name: [name] [name] MRN: [id number] Date of Birth: 01-02-1946

## 2015-09-17 NOTE — Telephone Encounter (Signed)
Spoke with patient- she states she is having the same "effect" she had with Lyrica and Gabapentin. She states she is getting blurry vision with her eyes crossing. She didn't take Rytary this morning - then took her afternoon dose and it started again. She is convinced it is from medication. She states when switching from 145 mg to 95 mg it went away for awhile but came back. Made her aware this is probably not from medication, but she would like to switch back to Carbidopa Levodopa 25/100 IR - 1 tablet 3 times daily even though he complained of side effects with this medication as well. She would rather take Carbidopa Levodopa than Rytary. She does have appt with Sanda Klein in September to evaluate diplopia. Please advise.

## 2015-09-17 NOTE — Telephone Encounter (Signed)
[name] [name] 12/01/1945. She called and would like you to call her back at [phone]. She has been taking Rytary and she is having vision problems. She feels she needs to come off the medication. Thank you

## 2015-09-17 NOTE — Telephone Encounter (Signed)
That doesn't make sense physiologically.  They are all levodopa without extra ingredients

## 2015-09-20 NOTE — Telephone Encounter (Signed)
Patient made aware. She will stay on Rytary and await opinion of Dr. Hassell Done.

## 2015-09-22 ENCOUNTER — Ambulatory Visit (INDEPENDENT_AMBULATORY_CARE_PROVIDER_SITE_OTHER): Payer: Medicare Other | Admitting: *Deleted

## 2015-09-22 ENCOUNTER — Ambulatory Visit: Payer: Medicare Other | Admitting: Physical Therapy

## 2015-09-22 DIAGNOSIS — D6859 Other primary thrombophilia: Secondary | ICD-10-CM

## 2015-09-22 LAB — POCT INR: INR: 2.3

## 2015-09-22 NOTE — Progress Notes (Signed)
Pre visit review using our clinic review tool, if applicable. No additional management support is needed unless otherwise documented below in the visit note.  INR today 2.3. Pt findings negative.   Per Dr. Lorelei Pont: Continue taking 4 mg daily, except take 6 mg on Wednesdays. Recheck INR in 3 weeks.  Next appointment: 10/13/15  Dorrene German, RN

## 2015-09-22 NOTE — Patient Instructions (Signed)
Per Dr. Lorelei Pont: Continue taking 4 mg daily, except take 6 mg on Wednesdays. Recheck INR in 3 weeks.

## 2015-09-24 ENCOUNTER — Ambulatory Visit: Payer: Medicare Other | Admitting: Physical Therapy

## 2015-09-24 VITALS — HR 89

## 2015-09-24 DIAGNOSIS — R293 Abnormal posture: Secondary | ICD-10-CM | POA: Diagnosis not present

## 2015-09-24 DIAGNOSIS — R2689 Other abnormalities of gait and mobility: Secondary | ICD-10-CM

## 2015-09-24 DIAGNOSIS — R29818 Other symptoms and signs involving the nervous system: Secondary | ICD-10-CM | POA: Diagnosis not present

## 2015-09-24 DIAGNOSIS — R2681 Unsteadiness on feet: Secondary | ICD-10-CM | POA: Diagnosis not present

## 2015-09-24 NOTE — Therapy (Signed)
[facility] [address] [place], [place], [address] Phone: [phone]   Fax:  [phone]  Physical Therapy Treatment  Patient Details  Name: [name] [name] MRN: [id number] Date of Birth: 08-May-1945 Referring Provider: Wells Guiles Tat  Encounter Date: 09/24/2015      PT End of Session - 09/24/15 2256    Visit Number 5   Number of Visits 17   Date for PT Re-Evaluation 10/29/15   Authorization Type Medicare Primary, AARP 2nd-GCODE every 10th visit   PT Start Time 1022   PT Stop Time 1105   PT Time Calculation (min) 43 min   Activity Tolerance Patient tolerated treatment well   Behavior During Therapy [facility] for tasks assessed/performed      Past Medical History  Diagnosis Date  . Thyroid disease     Hypothyroidism  . Hypercholesterolemia   . Hyperlipidemia   . Chronic insomnia   . Glaucoma   . Fibromyalgia   . Arthritis   . Hypothyroidism     s/p graves disease  . Sinus infection     at present- dr Honor Loh PA aware- was seen there and at PCP 05/10/11  . Peripheral vascular disease ([place])     PULMONARY EMBOLUS x 2- 2011, 2012/ FOLLOWED BY DR ODOGWU-LOV 12/12 EPIC   PT STAES WILL STOP COUMADIN 3/12 and begin LOVONOX 05/17/11 as previously instructed  . Complication of anesthesia     severe itching night of surgery requiring meds- also couldnt swallow- throat "paralyzed""  . Hypertension     PCP Dr Cari Caraway- [place]  05/15/11 with clearance and note on chart,    chest x ray, EKG 12/12 EPIC, eccho 7/11 EPIC  . Kidney stone   . PD (Parkinson's disease) [facility])     Past Surgical History  Procedure Laterality Date  . Abdominal hysterectomy    . Exploratory laparotomy    . Joint replacement      left knee  7/12  . Colonoscopy    . Cataract extraction Bilateral   . Appendectomy    . Cholecystectomy    . Total knee arthroplasty  05/22/2011    Procedure: TOTAL KNEE ARTHROPLASTY;  Surgeon: Mauri Pole, MD;  Location: [facility]  ORS;  Service: Orthopedics;  Laterality: Right;    Filed Vitals:   09/24/15 1029  Pulse: 89  SpO2: 97%   Vitals after gait activities:  O2 sats 94% HR 10 >96 bpm.     Subjective Assessment - 09/24/15 1025    Subjective Have been noticing shortness of breath in the mornings when I wake up.  This morning has been the worse.  Have had a few near falls, but kept my balance.   Pertinent History Fibromyalgia   Patient Stated Goals Pt's goals for therapy include get rid of Parkinson's disease.   Currently in Pain? No/denies                         [facility] Adult PT Treatment/Exercise - 09/24/15 0001    Transfers as part of neuro-reed   Transfers Sit to Stand;Stand to Sit   Sit to Stand 5: Supervision;Without upper extremity assist;From bed;From chair/3-in-1   Five time sit to stand comments  20.63   Stand to Sit 5: Supervision   Number of Reps 10 reps   Transfer Cueing Provided cues for use of UE support, scooting forward in chair and increased forward lean with transfers.   Ambulation/Gait   Ambulation/Gait Yes  Ambulation/Gait Assistance 5: Supervision   Ambulation/Gait Assistance Details Pt continues to wear wedge in R heel; reports it feels fine.  No back pain.  Cues provided for R incr. step length and foot clearance   Ambulation Distance (Feet) 333 Feet   Assistive device Rollator   Gait Pattern Step-through pattern;Decreased step length - right;Decreased step length - left;Poor foot clearance - left;Poor foot clearance - right   Ambulation Surface Level;Indoor   Gait Comments 236 ft in 3 minutes   Standardized Balance Assessment   Standardized Balance Assessment Timed Up and Go Test   Timed Up and Go Test   TUG Normal TUG   Normal TUG (seconds) 18.39  then 19.32 sec, 18.28 sec       Therapeutic Exercise: Reviewed HEP provided last visit, including supine pelvic tilts, single knee to chest, trunk rotation, and seated hamstring stretch.  Pt return demo  understanding of HEP.  Discussed ways to manage pain in low back-pt verbalized use of exercises, positioning, and heel wedge in shoe.          PT Education - 09/24/15 2255    Education provided Yes   Education Details Walking program; progress towards goals, POC   Person(s) Educated Patient   Methods Explanation;Demonstration;Handout   Comprehension Verbalized understanding          PT Short Term Goals - 09/24/15 1038    PT SHORT TERM GOAL #1   Title Pt will be independent with HEP for improved posture, transfers, gait.  TARGET 09/28/15   Time 4   Period Weeks   Status Achieved   PT SHORT TERM GOAL #2   Title Pt will improve 5x sit<>stand trasnfers to less than or equal to 20 seocnds for improved efficiency and safety with transfers.   Baseline 20.63 sec   Time 4   Period Weeks   Status Not Met   PT SHORT TERM GOAL #3   Title Pt will improve TUG score to less than or equal to 16 seconds for decreased fall risk.   Baseline 18.28 sec at best   Time 4   Period Weeks   Status Not Met   PT SHORT TERM GOAL #4   Title Pt will verbalize at least 3 means to manage pain in low back for improved involvement in functional activities.   Baseline verbalizes positioning, exercises, and wedge in shoe    Time 4   Period Weeks   Status Achieved           PT Long Term Goals - 08/31/15 1556    PT LONG TERM GOAL #1   Title Pt will verbalize understanding of fall prevention and tips to reduce freezing of gait within home environment.  TARGET 10/29/15   Time 8   Period Weeks   Status New   PT LONG TERM GOAL #2   Title Pt will report at least 25% improvement in bed mobility.   Time 8   Period Weeks   Status New   PT LONG TERM GOAL #3   Title Pt will improve TUG cognitive to less than or equal to 20 seconds for decreased fall risk/improved ability for dual tasking.   Time 8   Period Weeks   Status New   PT LONG TERM GOAL #4   Title Pt will perform floor>stand transfer with UE  support, modified independently, for safe fall recovery.   Time 8   Period Weeks   Status New  Plan - 09/24/15 2256    Clinical Impression Statement Pt has met STG #1 and #4, and pt very close to meeting goal for 5x sit<>stand.  Pt demonstrates improved ability for transfers and does reports improved ability to keep balance.  She continues to report fatigue and balance challenges.  Pt would continue to beneift from further skilled PT to address balance, gait and transfers for overall safety with functional moiblity.   Rehab Potential Good   PT Frequency 2x / week   PT Duration 8 weeks  plus eval   PT Treatment/Interventions ADLs/Self Care Home Management;Therapeutic exercise;Functional mobility training;Therapeutic activities;Gait training;Balance training;Neuromuscular re-education;Patient/family education   PT Next Visit Plan Transfers, gait training, review and discuss progression of walking program; discuss community fitness.   Consulted and Agree with Plan of Care Patient      Patient will benefit from skilled therapeutic intervention in order to improve the following deficits and impairments:  Abnormal gait, Decreased activity tolerance, Decreased balance, Decreased mobility, Decreased strength, Difficulty walking, Postural dysfunction, Pain  Visit Diagnosis: Other abnormalities of gait and mobility  Abnormal posture     Problem List Patient Active Problem List   Diagnosis Date Noted  . Trochanteric bursitis of both hips 09/13/2015  . Chronic low back pain 06/07/2015  . Fibromyalgia syndrome 05/17/2015  . Hypothyroidism 05/17/2015  . Parkinson's disease ([place]) 05/17/2015  . Hypercoagulable state ([place]) 02/07/2012  . Embolism ([place]) 02/07/2012  . S/P right TKA 05/22/2011    Eloise Picone W. 09/24/2015, 11:07 PM  Frazier Butt., PT  [facility] [address] [place], [place],  [address] Phone: [phone]   Fax:  [phone]  Name: [name] [name] MRN: [id number] Date of Birth: August 24, 1945

## 2015-09-24 NOTE — Patient Instructions (Signed)
WALKING  Walking is a great form of exercise to increase your strength, endurance and overall fitness.  A walking program can help you start slowly and gradually build endurance as you go.  Everyone's ability is different, so each person's starting point will be different.  You do not have to follow them exactly.  The are just samples. You should simply find out what's right for you and stick to that program.   In the beginning, you'll start off walking 2-3 times a day for short distances.  As you get stronger, you'll be walking further at just 1-2 times per day.  A. You Can Walk For A Certain Length Of Time Each Day    Walk 3 minutes 3 times per day.  Increase 1-2 minutes every 3-4 days (3 times per day).  Work up to  8-73minutes (1-2 times per day).   Example:   Day 1-2 3 minutes 3 times per day   Day 7-8 5-6 minutes 2-3 times per day   Day 13-14 8-10 minutes 1-2 times per day  B. You Can Walk For a Certain Distance Each Day     Distance can be substituted for time.

## 2015-09-28 ENCOUNTER — Ambulatory Visit: Payer: Medicare Other | Admitting: Physical Therapy

## 2015-09-28 DIAGNOSIS — R29818 Other symptoms and signs involving the nervous system: Secondary | ICD-10-CM | POA: Diagnosis not present

## 2015-09-28 DIAGNOSIS — R2681 Unsteadiness on feet: Secondary | ICD-10-CM

## 2015-09-28 DIAGNOSIS — R2689 Other abnormalities of gait and mobility: Secondary | ICD-10-CM | POA: Diagnosis not present

## 2015-09-28 DIAGNOSIS — R293 Abnormal posture: Secondary | ICD-10-CM | POA: Diagnosis not present

## 2015-09-29 NOTE — Therapy (Signed)
[facility] [address] [place], [place], [address] Phone: [phone]   Fax:  [phone]  Physical Therapy Treatment  Patient Details  Name: [name] [name] MRN: [id number] Date of Birth: Jul 30, 1945 Referring Provider: Wells Guiles Tat  Encounter Date: 09/28/2015      PT End of Session - 09/29/15 1225    Visit Number 6   Number of Visits 17   Date for PT Re-Evaluation 10/29/15   Authorization Type Medicare Primary, AARP 2nd-GCODE every 10th visit   PT Start Time 1106   PT Stop Time 1146   PT Time Calculation (min) 40 min   Activity Tolerance Patient tolerated treatment well   Behavior During Therapy [facility] for tasks assessed/performed      Past Medical History:  Diagnosis Date  . Arthritis   . Chronic insomnia   . Complication of anesthesia    severe itching night of surgery requiring meds- also couldnt swallow- throat "paralyzed""  . Fibromyalgia   . Glaucoma   . Hypercholesterolemia   . Hyperlipidemia   . Hypertension    PCP Dr Cari Caraway- [place]  05/15/11 with clearance and note on chart,    chest x ray, EKG 12/12 EPIC, eccho 7/11 EPIC  . Hypothyroidism    s/p graves disease  . Kidney stone   . PD (Parkinson's disease) ([place])   . Peripheral vascular disease ([place])    PULMONARY EMBOLUS x 2- 2011, 2012/ FOLLOWED BY DR ODOGWU-LOV 12/12 EPIC   PT STAES WILL STOP COUMADIN 3/12 and begin LOVONOX 05/17/11 as previously instructed  . Sinus infection    at present- dr Honor Loh PA aware- was seen there and at PCP 05/10/11  . Thyroid disease    Hypothyroidism    Past Surgical History:  Procedure Laterality Date  . ABDOMINAL HYSTERECTOMY    . APPENDECTOMY    . CATARACT EXTRACTION Bilateral   . CHOLECYSTECTOMY    . COLONOSCOPY    . EXPLORATORY LAPAROTOMY    . JOINT REPLACEMENT     left knee  7/12  . TOTAL KNEE ARTHROPLASTY  05/22/2011   Procedure: TOTAL KNEE ARTHROPLASTY;  Surgeon: Mauri Pole, MD;  Location: [facility];   Service: Orthopedics;  Laterality: Right;    There were no vitals filed for this visit.      Subjective Assessment - 09/28/15 1110    Subjective Stayed in bed most of the weekend-my eyes are bothering me so bad and I'm fearful that I will fall.   Pertinent History Fibromyalgia   Patient Stated Goals Pt's goals for therapy include get rid of Parkinson's disease.   Currently in Pain? No/denies                         [facility] Adult PT Treatment/Exercise - 09/28/15 1112      Transfers   Transfers Sit to Stand;Stand to Sit   Sit to Stand 5: Supervision   Stand to Sit 5: Supervision   Number of Reps 10 reps;Other sets (comment)  each, from 20", 18" surfaces   Transfer Cueing Cues for increased forward lean; cues to slow descent into sitting.   Comments Practiced simulation of breakdown/set-up of rollator as if putting in and taking out of car.  Provided cues for foot placement and technique for ease of getting rollator in and out of car, multiple reps.      Ambulation/Gait   Ambulation/Gait Yes   Ambulation/Gait Assistance 5: Supervision   Ambulation/Gait Assistance  Details Attempted gait with RW per patient request, as she reports her rollator is heavy to get into car.  Pt feels RW is to hard to maneuver   Ambulation Distance (Feet) 320 Feet  with rollator, then 150 with RW   Assistive device Rollator;Rolling walker   Gait Pattern Step-through pattern;Decreased step length - right;Decreased step length - left;Poor foot clearance - left;Poor foot clearance - right  Able to improve R step length with cues   Ambulation Surface Level;Indoor   Gait Comments 290 ft.  in 3 minutes using rollator walker      Self Care: Discussed patient's overall participation in therapy recommendations at home.  Pt admits doing overall less at home, including not doing therapy HEP or walking over the weekend.  She feels fear of falling and overall lack of motivation, ("just not wanting to do  it") as factors in decreased participation in functional activities.  PT notes that this appears to be a change for the patient.  Discussed and reviewed options, with need for patient to discuss with doctor (neurologist versus Dr. Letta Pate, who pt reports recommended change in medication to Cymbalta recently) her symptoms of decreased motivation, decreased participation in activities in order for them to address.  Also discussed again option for neuropsychology.            PT Education - 09/29/15 1224    Education provided Yes   Education Details reviewed walking program   Person(s) Educated Patient   Methods Explanation   Comprehension Verbalized understanding          PT Short Term Goals - 09/24/15 1038      PT SHORT TERM GOAL #1   Title Pt will be independent with HEP for improved posture, transfers, gait.  TARGET 09/28/15   Time 4   Period Weeks   Status Achieved     PT SHORT TERM GOAL #2   Title Pt will improve 5x sit<>stand trasnfers to less than or equal to 20 seocnds for improved efficiency and safety with transfers.   Baseline 20.63 sec   Time 4   Period Weeks   Status Not Met     PT SHORT TERM GOAL #3   Title Pt will improve TUG score to less than or equal to 16 seconds for decreased fall risk.   Baseline 18.28 sec at best   Time 4   Period Weeks   Status Not Met     PT SHORT TERM GOAL #4   Title Pt will verbalize at least 3 means to manage pain in low back for improved involvement in functional activities.   Baseline verbalizes positioning, exercises, and wedge in shoe    Time 4   Period Weeks   Status Achieved           PT Long Term Goals - 08/31/15 1556      PT LONG TERM GOAL #1   Title Pt will verbalize understanding of fall prevention and tips to reduce freezing of gait within home environment.  TARGET 10/29/15   Time 8   Period Weeks   Status New     PT LONG TERM GOAL #2   Title Pt will report at least 25% improvement in bed mobility.    Time 8   Period Weeks   Status New     PT LONG TERM GOAL #3   Title Pt will improve TUG cognitive to less than or equal to 20 seconds for decreased fall risk/improved ability for dual  tasking.   Time 8   Period Weeks   Status New     PT LONG TERM GOAL #4   Title Pt will perform floor>stand transfer with UE support, modified independently, for safe fall recovery.   Time 8   Period Weeks   Status New               Plan - 09/29/15 1225    Clinical Impression Statement Pt performs well with gait activities with supervision in therapy sessions.  However, at home, she continues to report decreased ability to carry over activities from therapy, either citing fear of falling or "just not wanting to do it."  Disccussed pt's decreased motivation for activities and how this can have a negative impact long term on her functional mobility.  Pt will continue to benefit from further skilled PT to address balance, gait and transfers for overall safety and functional mobility.   Rehab Potential Good   PT Frequency 2x / week   PT Duration 8 weeks  plus eval   PT Treatment/Interventions ADLs/Self Care Home Management;Therapeutic exercise;Functional mobility training;Therapeutic activities;Gait training;Balance training;Neuromuscular re-education;Patient/family education   PT Next Visit Plan Work on standing balance activities in parallel bars>counter; discuss community fitness.   Consulted and Agree with Plan of Care Patient      Patient will benefit from skilled therapeutic intervention in order to improve the following deficits and impairments:  Abnormal gait, Decreased activity tolerance, Decreased balance, Decreased mobility, Decreased strength, Difficulty walking, Postural dysfunction, Pain  Visit Diagnosis: Other abnormalities of gait and mobility  Unsteadiness on feet     Problem List Patient Active Problem List   Diagnosis Date Noted  . Trochanteric bursitis of both hips  09/13/2015  . Chronic low back pain 06/07/2015  . Fibromyalgia syndrome 05/17/2015  . Hypothyroidism 05/17/2015  . Parkinson's disease ([place]) 05/17/2015  . Hypercoagulable state ([place]) 02/07/2012  . Embolism ([place]) 02/07/2012  . S/P right TKA 05/22/2011    Ladislaus Repsher W. 09/29/2015, 12:29 PM  Frazier Butt., PT  [facility] [address] [place], [place], [address] Phone: [phone]   Fax:  [phone]  Name: [name] [name] MRN: [id number] Date of Birth: [birth year]

## 2015-09-30 ENCOUNTER — Ambulatory Visit: Payer: Medicare Other | Admitting: Physical Therapy

## 2015-10-06 ENCOUNTER — Ambulatory Visit: Payer: Medicare Other | Admitting: Physical Therapy

## 2015-10-08 ENCOUNTER — Ambulatory Visit (INDEPENDENT_AMBULATORY_CARE_PROVIDER_SITE_OTHER): Payer: Medicare Other | Admitting: Behavioral Health

## 2015-10-08 DIAGNOSIS — D6859 Other primary thrombophilia: Secondary | ICD-10-CM

## 2015-10-08 LAB — POCT INR: INR: 2.6

## 2015-10-08 NOTE — Progress Notes (Signed)
Pre visit review using our clinic review tool, if applicable. No additional management support is needed unless otherwise documented below in the visit note.  Patient presents in office for INR check. Today's reading was 2.6. She did not report any positive findings or changes with medications/diet.  Per Dr. Lorelei Pont: Continue taking 4 mg daily, except take 6 mg on Wednesdays. Recheck INR in 3 weeks.  Informed patient of the provider's instructions. She verbalized understanding and did not have any questions or concerns before leaving the nurse visit.  Next appointment scheduled for 10/29/15 at 10:45 AM.

## 2015-10-08 NOTE — Patient Instructions (Signed)
Per Dr. Lorelei Pont: Continue taking 4 mg daily, except take 6 mg on Wednesdays. Recheck INR in 3 weeks.

## 2015-10-13 ENCOUNTER — Ambulatory Visit: Payer: Medicare Other

## 2015-10-14 ENCOUNTER — Ambulatory Visit: Payer: Medicare Other | Admitting: Neurology

## 2015-10-15 ENCOUNTER — Ambulatory Visit: Payer: Medicare Other

## 2015-10-19 NOTE — Progress Notes (Signed)
[name] [name] was seen today in the movement disorders clinic for neurologic consultation at the request of [name],[name], MD.   This patient is accompanied in the office by her child who supplements the history.   Prior records made available to me were reviewed.  The patient first saw Dr. Leta [name] in March, 2016 at which point she was complaining about symptoms of diplopia and weakness.  Because of the diplopia she had acetylcholine receptor antibodies that were performed that were negative.  She has had an MRI and MRA of the brain with and without gadolinium.  I reviewed these images.  These were done on 05/21/2014.  This just revealed mild small vessel disease.  She also had a carotid ultrasound with him in March, 2016 revealed less than 50% stenosis bilaterally.  She followed up with him on 07/23/2014 and started on carbidopa/levodopa 25/100, one tablet 3 times per day (8am, 12pm, 5pm, before the meals).  She called back to him and complained about diffuse psain, headaches, depression and nausea.  Although he was not completely convinced that some of these things were not associated with her fibromyalgia and situational depression (her grandson was leaving for college and he serves as a caregiver), it was decided to taper off of her levodopa on 09/16/2014.   She did this and is now off the medication.  She states today that she initially felt great on the medication but then felt that it caused side effects.  Now that she is off of it, however, she c/o difficulty with walking. They also discussed referrals to pain management and psychiatry.  She presents today for a second opinion.  Pt states that diplopia was her first sx and then she began to notice falling.  This started in 04/2013.  The diplopia was horizontal in nature and she initially thought that it was due to lyrica.  The lyrica was d/c and it initally seemed to get better but then it came back.  It gets better if she closes an eye (doesn't  know if it matters which eye she closes).  She has some degree of diplopia daily but mostly at night.  She sees Dr. Arnoldo [name] at [facility] and was told that nothing was wrong with the eyes.    10/29/14 update:  The patient is following up today.  Overall, she states that she is feeling well.  She really hasn't had significant diplopia since our last visit.  She had her DaT scan on 10/14/2014 and while tracer uptake was present in the bilateral attainment, it was decreased on the right putamen relative to the right caudate, which can be seen in early parkinsonian syndromes.  The patient admits that she is still very slow.  She saw a different ophthalmologist [facility] imaging since our last visit and states that the diagnosis of glaucoma was retracted and everything looked well.  She denies any falls since our last visit no hallucinations.  12/10/14 update:  The patient is accompanied by her daughter who supplements the history.  I started the patient on carbidopa/levodopa 25/100 tid last visit.  Today, she states that she thinks that the medication makes her fibromyalgia worse but admits that it could be the weather.  Admits to a fall yesterday.   She was going into the house and there is an odd shaped stair (L shaped landing) and she fell backwards.   She called 911 and she went to [facility] and she had a CT brain and she states that it  was negative.  Had her INR checked and it was 2.6.  Does state that she was vomiting on way on the way to the hospital so they kept her for a few hours.  Still nauseated some today but has been nauseated even prior to that and wonders if the medication contributes.  She did not take any carbidopa/levodopa today.   03/09/14 update:  The patient follows up today.  Last visit, she complained about multiple side effects of levodopa including the fact that she thought that it made her fibromyalgia worse.  Although I told her that I was not convinced that it caused many of these side  effects, we ultimately decided to try rytary instead.  She started with 95 mg 3 times a day and worked up to 145 mg 3 times a day.  She called me on October 20 to state that she was doing well and then called back on November 11 to state that the medication was causing dizziness and diplopia.  She wanted to decrease the medication.  We explained that sometimes the disease can cause the symptoms and so I had her hold the medication for a few days and she stated that the dizziness and diplopia resolved.  She asked again to go back down to the 95 mg 3 times a day, which we ultimately did.  She feels well going to PT.  She is not exercising otherwise.  She asks me for a referral to pain management for her fibromyalgia.  She has not had falls.  No hallucinations.    07/13/15 update:  The patient follows up today.  This patient is accompanied in the office by her daughter who supplements the history.  She is on Rytary, 95 mg 3 times per day.  She doesn't think that it wears off.  It is very expensive but she didn't qualify for the [facility] program.  She did physical therapy since our last visit.  She was referred to pain management with Dr. Letta [name].  I reviewed his records and appreciate the input.  She was started on Cymbalta and she states that she is doing well with that and ultram.  She felt great yesterday but doesn't today and attributes that to the rain.  No hallucinations.  Still with diplopia.  Daughter asks about referral to neuro-ophth.  10/21/15 update:  The patient follows up today, unaccompanied today.  She remains on Rytary, 95 mg 3 times per day.  Last visit, she wanted a referral to neuro-ophthalmology for her diplopia.  Her appt is next month.  She has not yet had that appt that she did call me and stated that she wanted to go off of the rytary and back on the older version of levodopa because she thought that the rytary was causing double vision.  We had explained to her that this just did not make  physiologic sense because they all essentially have the same ingredient.  Therefore, she is still on the rytary. She has to cover an eye (either) to see well.   Had a fall from bathroom into bedroom.  She bruised her arms.  One other fall that was very similar.  Has been "on an off" with physical therapy.  Has a stationary bike and rides that 3 times a week in the house.    PREVIOUS MEDICATIONS: Sinemet  ALLERGIES:   Allergies  Allergen Reactions  . Crestor [Rosuvastatin Calcium] Other (See Comments)    Feeling very bad, aching all over.  . Erythromycin  Hives  . Pregabalin Other (See Comments)    Crossed vision.  Marland Kitchen Macrobid [Nitrofurantoin] Hives  . Carbidopa     Headaches, nausea, dizziness  . Gabapentin Other (See Comments)    Blurred vision  . Losartan     CURRENT MEDICATIONS:  Outpatient Encounter Prescriptions as of 10/21/2015  Medication Sig  . acetaminophen (TYLENOL) 325 MG tablet Take 650 mg by mouth every 6 (six) hours as needed.  Marland Kitchen aspirin 81 MG tablet Take 81 mg by mouth daily after breakfast.   . azelastine (ASTELIN) 0.1 % nasal spray Place 1 spray into both nostrils 2 (two) times daily. Use in each nostril as directed  . B Complex-C (SUPER B COMPLEX PO) Take 1 tablet by mouth at bedtime.   . Calcium Carbonate-Vitamin D (CALCIUM + D PO) Take 2 tablets by mouth at bedtime. 600-200 mg tab.  . Carbidopa-Levodopa ER (RYTARY) 23.75-95 MG CPCR Take 1 tablet by mouth 3 (three) times daily.  . Cholecalciferol (VITAMIN D3) 5000 UNITS CAPS Take 5,000 Units by mouth daily.   . DULoxetine (CYMBALTA) 60 MG capsule Take 1 capsule (60 mg total) by mouth daily.  . fluocinonide (LIDEX) 0.05 % external solution Apply to scalp as needed  . hydrocortisone valerate cream (WESTCORT) 0.2 % Apply topically as needed  . Magnesium 250 MG TABS Take 1 tablet by mouth at bedtime.   . Multiple Vitamin (MULTIVITAMIN) tablet Take 1 tablet by mouth daily.   . Omega-3 Fatty Acids (FISH OIL) 1200 MG CAPS  Take 2 capsules by mouth at bedtime.   Marland Kitchen SYNTHROID 150 MCG tablet Take 150 mcg by mouth daily before breakfast.   . telmisartan-hydrochlorothiazide (MICARDIS HCT) 40-12.5 MG per tablet Take 1 tablet by mouth daily.   . traMADol (ULTRAM) 50 MG tablet Take 1 tablet (50 mg total) by mouth every 8 (eight) hours as needed for moderate pain.  Marland Kitchen warfarin (COUMADIN) 5 MG tablet Take 4 mg by mouth daily. 1 tab Monday, Wednesday, Friday and Saturday. 1/2 tab Tuesday, Thursday and Sunday  . zolpidem (AMBIEN) 10 MG tablet Take 10 mg by mouth at bedtime.    No facility-administered encounter medications on file as of 10/21/2015.     PAST MEDICAL HISTORY:   Past Medical History:  Diagnosis Date  . Arthritis   . Chronic insomnia   . Complication of anesthesia    severe itching night of surgery requiring meds- also couldnt swallow- throat "paralyzed""  . Fibromyalgia   . Glaucoma   . Hypercholesterolemia   . Hyperlipidemia   . Hypertension    PCP Dr Cari Caraway- [place]  05/15/11 with clearance and note on chart,    chest x ray, EKG 12/12 EPIC, eccho 7/11 EPIC  . Hypothyroidism    s/p graves disease  . Kidney stone   . PD (Parkinson's disease) ([place])   . Peripheral vascular disease ([place])    PULMONARY EMBOLUS x 2- 2011, 2012/ FOLLOWED BY DR ODOGWU-LOV 12/12 EPIC   PT STAES WILL STOP COUMADIN 3/12 and begin LOVONOX 05/17/11 as previously instructed  . Sinus infection    at present- dr Honor Loh PA aware- was seen there and at PCP 05/10/11  . Thyroid disease    Hypothyroidism    PAST SURGICAL HISTORY:   Past Surgical History:  Procedure Laterality Date  . ABDOMINAL HYSTERECTOMY    . APPENDECTOMY    . CATARACT EXTRACTION Bilateral   . CHOLECYSTECTOMY    . COLONOSCOPY    . EXPLORATORY LAPAROTOMY    .  JOINT REPLACEMENT     left knee  7/12  . TOTAL KNEE ARTHROPLASTY  05/22/2011   Procedure: TOTAL KNEE ARTHROPLASTY;  Surgeon: Mauri Pole, MD;  Location: [facility];  Service: Orthopedics;  Laterality: Right;     SOCIAL HISTORY:   Social History   Social History  . Marital status: Widowed    Spouse name: N/A  . Number of children: 2  . Years of education: N/A   Occupational History  . Not on file.   Social History Main Topics  . Smoking status: Former Smoker    Packs/day: 1.00    Quit date: 05/10/1989  . Smokeless tobacco: Never Used  . Alcohol use 0.0 oz/week     Comment: one drink once a month  . Drug use: No  . Sexual activity: Not on file   Other Topics Concern  . Not on file   Social History Narrative   Lives at home alone   Drinks caffeine occasionally     FAMILY HISTORY:   Family Status  Relation Status  . Mother Deceased at age 45   peritonitis  . Father Deceased at age 66   stroke, prostate cancer  . Sister Alive   10 siblings - 1 cerebral palsy  . Brother Alive    ROS:  A complete 10 system review of systems was obtained and was unremarkable apart from what is mentioned above.  PHYSICAL EXAMINATION:    VITALS:   Vitals:   10/21/15 1130  BP: 118/72  Pulse: (!) 103  Weight: 201 lb (91.2 kg)  Height: 5' 5.5" (1.664 m)   Wt Readings from Last 3 Encounters:  10/21/15 201 lb (91.2 kg)  09/15/15 206 lb (93.4 kg)  07/13/15 207 lb (93.9 kg)     GEN:  The patient appears stated age and is in NAD. HEENT:  Normocephalic, atraumatic.  CV:  RRR Lungs:  CTAB.   Neck/HEME:  There is a soft right carotid bruit.   There is decreased range of motion of the neck.  She has difficulty turning the head to the right.  Neurological examination:  Orientation: The patient is alert and oriented x3.  Cranial nerves: There is good facial symmetry.  There is facial hypomimia.   The speech is fluent and clear.  There are no square wave jerks.  EOMI.   She has no difficulty with the guttural sounds.  Soft palate rises symmetrically and there is no tongue deviation. Hearing is intact to conversational tone. Sensation: Sensation is intact to light touch throughout. Motor:  Strength is 5/5 in the bilateral upper and lower extremities.   Shoulder shrug is equal and symmetric.  There is no pronator drift.  There are no fasciculations noted.   Movement examination: Tone: There is normal tone in the bilateral upper extremities.  The tone in the lower extremities is normal.  Abnormal movements: None, even with distraction procedures Coordination:  There is minimal decreased toe taps on the L Gait and Station: The patient has no difficulty arising out of a deep-seated chair without the use of the hands.  Stride length is much decreased today with decreased arm swing.    ASSESSMENT/PLAN:  1.  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  Her DaT scan demonstrated decreased uptake of the tracer in the right putamen, but it was very mild.   I am not convinced  that everything that she reports as "side effects" of levodopa are really side effects.  She thought that it made her fibromyalgia worse.  She has also thought that rytary 145 mg tid caused diplopia and she backed down to 95 mg 3 times per day and for a while she thought she did well with this, but is not stating that this is causing diplopia.  I have explained to her that this is not the cause of her diplopia.  We have explained several times that the disease may cause diplopia, but it is unlikely that the medication is.  She has opted to stay on the medication until she sees Dr. Hassell Done, which is scheduled for September.    Risks, benefits, side effects and alternative therapies were discussed.  The opportunity to ask questions was given and they were answered to the best of my ability.  The patient expressed understanding and willingness to follow the outlined treatment protocols.  2.  Fibromyalgia  -much better now that seeing Dr. Letta [name] 3.  R carotid bruit  -had carotid u/s in 05/2014 that was normal.  Will  reorder.   3.  .Much greater than 50% of this visit was spent in counseling with the patient.  Total face to face time:  25 min

## 2015-10-20 ENCOUNTER — Encounter: Payer: Self-pay | Admitting: Neurology

## 2015-10-20 ENCOUNTER — Ambulatory Visit: Payer: Medicare Other | Admitting: Physical Therapy

## 2015-10-21 ENCOUNTER — Ambulatory Visit: Payer: Medicare Other | Attending: Neurology | Admitting: Physical Therapy

## 2015-10-21 ENCOUNTER — Encounter: Payer: Self-pay | Admitting: Neurology

## 2015-10-21 ENCOUNTER — Ambulatory Visit (INDEPENDENT_AMBULATORY_CARE_PROVIDER_SITE_OTHER): Payer: Medicare Other | Admitting: Neurology

## 2015-10-21 VITALS — BP 118/72 | HR 103 | Ht 65.5 in | Wt 201.0 lb

## 2015-10-21 DIAGNOSIS — R293 Abnormal posture: Secondary | ICD-10-CM | POA: Diagnosis not present

## 2015-10-21 DIAGNOSIS — R2681 Unsteadiness on feet: Secondary | ICD-10-CM | POA: Diagnosis not present

## 2015-10-21 DIAGNOSIS — I6521 Occlusion and stenosis of right carotid artery: Secondary | ICD-10-CM | POA: Diagnosis not present

## 2015-10-21 DIAGNOSIS — G232 Striatonigral degeneration: Secondary | ICD-10-CM

## 2015-10-21 DIAGNOSIS — R29818 Other symptoms and signs involving the nervous system: Secondary | ICD-10-CM | POA: Diagnosis not present

## 2015-10-21 DIAGNOSIS — R2689 Other abnormalities of gait and mobility: Secondary | ICD-10-CM | POA: Diagnosis not present

## 2015-10-21 DIAGNOSIS — I749 Embolism and thrombosis of unspecified artery: Secondary | ICD-10-CM | POA: Diagnosis not present

## 2015-10-21 MED ORDER — CARBIDOPA-LEVODOPA ER 23.75-95 MG PO CPCR: 1.0000 | ORAL_CAPSULE | Freq: Three times a day (TID) | ORAL | 0 refills | Status: DC

## 2015-10-21 NOTE — Patient Instructions (Signed)
11/22/2015 Initial consult Ophthalmology [name] [name], [facility] [place], [place] [address]  [phone]  [phone] (Fax)

## 2015-10-21 NOTE — Therapy (Signed)
[facility] [address] [place], [place], [address] Phone: [phone]   Fax:  [phone]  Physical Therapy Treatment  Patient Details  Name: [name] [name] MRN: [id number] Date of Birth: [birth year] Referring Provider: Wells Guiles Tat  Encounter Date: 10/21/2015      PT End of Session - 10/21/15 1514    Visit Number 7   Number of Visits 17   Date for PT Re-Evaluation 10/29/15   Authorization Type Medicare Primary, AARP 2nd-GCODE every 10th visit   PT Start Time 1318   PT Stop Time 1401   PT Time Calculation (min) 43 min   Activity Tolerance Other (comment)  tearful during entire session   Behavior During Therapy Anxious  tearful      Past Medical History:  Diagnosis Date  . Arthritis   . Chronic insomnia   . Complication of anesthesia    severe itching night of surgery requiring meds- also couldnt swallow- throat "paralyzed""  . Fibromyalgia   . Glaucoma   . Hypercholesterolemia   . Hyperlipidemia   . Hypertension    PCP Dr Cari Caraway- [place]  05/15/11 with clearance and note on chart,    chest x ray, EKG 12/12 EPIC, eccho 7/11 EPIC  . Hypothyroidism    s/p graves disease  . Kidney stone   . PD (Parkinson's disease) ([place])   . Peripheral vascular disease ([place])    PULMONARY EMBOLUS x 2- 2011, 2012/ FOLLOWED BY DR ODOGWU-LOV 12/12 EPIC   PT STAES WILL STOP COUMADIN 3/12 and begin LOVONOX 05/17/11 as previously instructed  . Sinus infection    at present- dr Honor Loh PA aware- was seen there and at PCP 05/10/11  . Thyroid disease    Hypothyroidism    Past Surgical History:  Procedure Laterality Date  . ABDOMINAL HYSTERECTOMY    . APPENDECTOMY    . CATARACT EXTRACTION Bilateral   . CHOLECYSTECTOMY    . COLONOSCOPY    . EXPLORATORY LAPAROTOMY    . JOINT REPLACEMENT     left knee  7/12  . TOTAL KNEE ARTHROPLASTY  05/22/2011   Procedure: TOTAL KNEE ARTHROPLASTY;  Surgeon: Mauri Pole, MD;  Location: [facility];   Service: Orthopedics;  Laterality: Right;    There were no vitals filed for this visit.      Subjective Assessment - 10/21/15 1509    Subjective Pt tearful during almost all of session today.  Went to see Dr Tat this morning.  Has had one fall since last visit where she "just fell".  Unsure of cause.   Pertinent History Fibromyalgia   Patient Stated Goals Pt's goals for therapy include get rid of Parkinson's disease.   Currently in Pain? Other (Comment)  continues with overall fibromyalgia pain and does report bil knee pain at times but not at present time         [facility] Adult PT Treatment/Exercise - 10/21/15 0001      Self-Care   Self-Care Other Self-Care Comments      Pt tearful upon entering clinic.  Pt continues to report only walking around the house and not out in community.  States she does not go out anymore other than to go to her medical appointments.  Pt saw Dr Tat this morning and has an appointment in September with neuro opthamologist for changes in vision.  Her grandson is having increased medical issues and states that she is having a hard time watching her daughter go through taking care of  him and the other grandchildren.  States she does not feel like she has anyone she can talk to.  Pt reports her frustration comes from "this disease" diagnosis.  She continues to report fear of falling and overall lack of motivation.  Discussed options again for pt to discuss with her neurologist and option for neuropsychology.  Pt again states (as she did on last visit) that Dr. Letta Pate changed her medication in March 2017 and she noticed increased depression/lack of motivation after the change in medication.  Pt at times has a difficult time expressing her concerns and contradicts herself during conversation (Initially says she has no one to talk to but then says she talks to her niece,  Initially states she cries all the time then states that she only cries during therapy sessions,  etc...)    Discussed pt nearing end of POC and checking goals next session.  Pt states frustration over multiple medical appointments and agrees to wrapping up next week per POC to focus on other issues/concerns.  Above discussed with Mady Haagensen, PT.  PT to contact Dr Letta Pate re: above change in medication and see if pt can be seen in his office sooner than scheduled appointment.      PT Education - 10/21/15 1511    Education provided Yes   Education Details Neuropsychologist vs psychologist, need to contact Dr Bonney Aid concerning increased depression and tearfulness since he changed her meds, checking goals next week and preparing for d/c from PT per POC   Person(s) Educated Patient   Methods Explanation   Comprehension Verbalized understanding          PT Short Term Goals - 09/24/15 1038      PT SHORT TERM GOAL #1   Title Pt will be independent with HEP for improved posture, transfers, gait.  TARGET 09/28/15   Time 4   Period Weeks   Status Achieved     PT SHORT TERM GOAL #2   Title Pt will improve 5x sit<>stand trasnfers to less than or equal to 20 seocnds for improved efficiency and safety with transfers.   Baseline 20.63 sec   Time 4   Period Weeks   Status Not Met     PT SHORT TERM GOAL #3   Title Pt will improve TUG score to less than or equal to 16 seconds for decreased fall risk.   Baseline 18.28 sec at best   Time 4   Period Weeks   Status Not Met     PT SHORT TERM GOAL #4   Title Pt will verbalize at least 3 means to manage pain in low back for improved involvement in functional activities.   Baseline verbalizes positioning, exercises, and wedge in shoe    Time 4   Period Weeks   Status Achieved           PT Long Term Goals - 08/31/15 1556      PT LONG TERM GOAL #1   Title Pt will verbalize understanding of fall prevention and tips to reduce freezing of gait within home environment.  TARGET 10/29/15   Time 8   Period Weeks   Status New     PT  LONG TERM GOAL #2   Title Pt will report at least 25% improvement in bed mobility.   Time 8   Period Weeks   Status New     PT LONG TERM GOAL #3   Title Pt will improve TUG cognitive to less than or equal  to 20 seconds for decreased fall risk/improved ability for dual tasking.   Time 8   Period Weeks   Status New     PT LONG TERM GOAL #4   Title Pt will perform floor>stand transfer with UE support, modified independently, for safe fall recovery.   Time 8   Period Weeks   Status New               Plan - 10/21/15 1517    Clinical Impression Statement Pt continues to report decreased motivation and feeling like she "cries" very frequently and is frustrated over her diagnosis.  See note for details.  Pt reports changes after Dr Bonney Aid changed her medication.  Will contact Dr Bonney Aid to notify him of session today and to see if he can see her sooner than her scheduled appointment on 11/15/15.  Pt agrees.  Discussed checking goals next week and d/c per POC.     Rehab Potential Good   PT Frequency 2x / week   PT Duration 8 weeks  plus eval   PT Treatment/Interventions ADLs/Self Care Home Management;Therapeutic exercise;Functional mobility training;Therapeutic activities;Gait training;Balance training;Neuromuscular re-education;Patient/family education   PT Next Visit Plan Begin checking goals.  Balance activities if time allows.   Consulted and Agree with Plan of Care Patient      Patient will benefit from skilled therapeutic intervention in order to improve the following deficits and impairments:  Abnormal gait, Decreased activity tolerance, Decreased balance, Decreased mobility, Decreased strength, Difficulty walking, Postural dysfunction, Pain  Visit Diagnosis: Other abnormalities of gait and mobility  Unsteadiness on feet  Abnormal posture  Other symptoms and signs involving the nervous system  Postural instability     Problem List Patient Active Problem List    Diagnosis Date Noted  . Trochanteric bursitis of both hips 09/13/2015  . Chronic low back pain 06/07/2015  . Fibromyalgia syndrome 05/17/2015  . Hypothyroidism 05/17/2015  . Parkinson's disease ([place]) 05/17/2015  . Hypercoagulable state ([place]) 02/07/2012  . Embolism ([place]) 02/07/2012  . S/P right TKA 05/22/2011    Narda Bonds 10/21/2015, 4:10 PM  [facility] [address] [place], [place], [address] Phone: [phone]   Fax:  [phone]  Name: Estefany Goebel MRN: [id number] Date of Birth: 05-May-1945   Narda Bonds, PTA [facility] 10/21/15 4:10 PM Phone: [phone] Fax: [phone]  PT spoke with PTA regarding pt's emotional status, frequent crying during PT sessions.  Agree with contacting Dr. Letta Pate regarding medication changes.  Mady Haagensen, PT 10/28/15 12:40 PM Phone: [phone] Fax: [phone]

## 2015-10-22 DIAGNOSIS — H40003 Preglaucoma, unspecified, bilateral: Secondary | ICD-10-CM | POA: Diagnosis not present

## 2015-10-22 DIAGNOSIS — H04123 Dry eye syndrome of bilateral lacrimal glands: Secondary | ICD-10-CM | POA: Diagnosis not present

## 2015-10-25 ENCOUNTER — Encounter (HOSPITAL_COMMUNITY): Payer: Medicare Other

## 2015-10-26 ENCOUNTER — Ambulatory Visit: Payer: Medicare Other | Admitting: Physical Therapy

## 2015-10-28 ENCOUNTER — Ambulatory Visit (HOSPITAL_COMMUNITY)
Admission: RE | Admit: 2015-10-28 | Discharge: 2015-10-28 | Disposition: A | Discharge status: Home / Self Care | Payer: Medicare Other | Source: Ambulatory Visit | Attending: Neurology | Admitting: Neurology

## 2015-10-28 ENCOUNTER — Encounter (HOSPITAL_COMMUNITY): Payer: Medicare Other

## 2015-10-28 ENCOUNTER — Telehealth: Payer: Self-pay | Admitting: Physical Therapy

## 2015-10-28 ENCOUNTER — Ambulatory Visit: Payer: Medicare Other | Admitting: Physical Therapy

## 2015-10-28 VITALS — BP 161/95 | HR 102

## 2015-10-28 DIAGNOSIS — G20 Parkinson's disease: Secondary | ICD-10-CM | POA: Diagnosis not present

## 2015-10-28 DIAGNOSIS — E039 Hypothyroidism, unspecified: Secondary | ICD-10-CM | POA: Diagnosis not present

## 2015-10-28 DIAGNOSIS — I10 Essential (primary) hypertension: Secondary | ICD-10-CM | POA: Insufficient documentation

## 2015-10-28 DIAGNOSIS — I6521 Occlusion and stenosis of right carotid artery: Secondary | ICD-10-CM | POA: Diagnosis not present

## 2015-10-28 DIAGNOSIS — R2681 Unsteadiness on feet: Secondary | ICD-10-CM

## 2015-10-28 DIAGNOSIS — E7800 Pure hypercholesterolemia, unspecified: Secondary | ICD-10-CM | POA: Diagnosis not present

## 2015-10-28 DIAGNOSIS — R293 Abnormal posture: Secondary | ICD-10-CM

## 2015-10-28 DIAGNOSIS — R29818 Other symptoms and signs involving the nervous system: Secondary | ICD-10-CM | POA: Diagnosis not present

## 2015-10-28 DIAGNOSIS — I6529 Occlusion and stenosis of unspecified carotid artery: Secondary | ICD-10-CM | POA: Diagnosis not present

## 2015-10-28 DIAGNOSIS — R2689 Other abnormalities of gait and mobility: Secondary | ICD-10-CM

## 2015-10-28 DIAGNOSIS — I6523 Occlusion and stenosis of bilateral carotid arteries: Secondary | ICD-10-CM | POA: Insufficient documentation

## 2015-10-28 NOTE — Therapy (Signed)
[facility] [address] [place], [place], [address] Phone: [phone]   Fax:  [phone]  Physical Therapy Treatment  Patient Details  Name: [name] [name] MRN: [id number] Date of Birth: [birth year] Referring Provider: Wells Guiles Tat  Encounter Date: 10/28/2015      PT End of Session - 10/28/15 1557    Visit Number 8   Number of Visits 17   Date for PT Re-Evaluation 10/29/15   Authorization Type Medicare Primary, AARP 2nd-GCODE every 10th visit   PT Start Time 1317   PT Stop Time 1400   PT Time Calculation (min) 43 min   Activity Tolerance Patient tolerated treatment well  tearful during entire session   Behavior During Therapy [facility] for tasks assessed/performed      Past Medical History:  Diagnosis Date  . Arthritis   . Chronic insomnia   . Complication of anesthesia    severe itching night of surgery requiring meds- also couldnt swallow- throat "paralyzed""  . Fibromyalgia   . Glaucoma   . Hypercholesterolemia   . Hyperlipidemia   . Hypertension    PCP Dr Cari Caraway- [place]  05/15/11 with clearance and note on chart,    chest x ray, EKG 12/12 EPIC, eccho 7/11 EPIC  . Hypothyroidism    s/p graves disease  . Kidney stone   . PD (Parkinson's disease) ([place])   . Peripheral vascular disease ([place])    PULMONARY EMBOLUS x 2- 2011, 2012/ FOLLOWED BY DR ODOGWU-LOV 12/12 EPIC   PT STAES WILL STOP COUMADIN 3/12 and begin LOVONOX 05/17/11 as previously instructed  . Sinus infection    at present- dr Honor Loh PA aware- was seen there and at PCP 05/10/11  . Thyroid disease    Hypothyroidism    Past Surgical History:  Procedure Laterality Date  . ABDOMINAL HYSTERECTOMY    . APPENDECTOMY    . CATARACT EXTRACTION Bilateral   . CHOLECYSTECTOMY    . COLONOSCOPY    . EXPLORATORY LAPAROTOMY    . JOINT REPLACEMENT     left knee  7/12  . TOTAL KNEE ARTHROPLASTY  05/22/2011   Procedure: TOTAL KNEE ARTHROPLASTY;  Surgeon: Mauri Pole, MD;  Location: [facility];  Service: Orthopedics;  Laterality: Right;    Vitals:   10/28/15 1353 10/28/15 1356 10/28/15 1357  BP: (!) 143/72 (!) 147/88 (!) 161/95  Pulse: 83 94 (!) 102        Subjective Assessment - 10/28/15 1322    Subjective Pt reports feeling better.  Got restasis for eyes and reports vision is better.  Had another fall-always in her bedroom in middle of the night.    Pertinent History Fibromyalgia   Patient Stated Goals Pt's goals for therapy include get rid of Parkinson's disease.   Currently in Pain? No/denies            [facility] Adult PT Treatment/Exercise - 10/28/15 0001      Transfers   Transfers Sit to Stand;Stand to Sit   Sit to Stand 5: Supervision   Five time sit to stand comments  17.97   Stand to Sit 5: Supervision     Ambulation/Gait   Gait velocity 2.25 ft/sec (14.56 sec with Rollator)     Standardized Balance Assessment   Standardized Balance Assessment Timed Up and Go Test     Timed Up and Go Test   TUG Normal TUG;Cognitive TUG;Manual TUG   Normal TUG (seconds) 14.6   Manual TUG (seconds) --  unable  since pt uses Rollator   Cognitive TUG (seconds) 14.75     Self-Care   Self-Care Other Self-Care Comments   Other Self-Care Comments  Reviewed Fall prevention strategies;Discussed community fitness options upon d/c-plans on walking with her grandson, using recumbent bike and exercises from therapy      Checked Orthostatic vitals during session as pt reports most of her falls happen when she gets up abruptly in middle of the night to go to bathroom. Pt was not orthostatic in clinic today (BP increased) yet discussed importance of sitting for a few minutes before standing and standing to gather balance before transfering or gait.  Pt uses BSC and keeps it right beside her bed yet still reports falls.  States that her legs just give out.          PT Education - 10/28/15 1553    Education provided Yes   Education Details Fall  prevention strategies, discharge from PT, PT sent message to Dr Bonney Aid concerning changes in medications and increased depression.   Person(s) Educated Patient   Methods Explanation;Handout   Comprehension Verbalized understanding          PT Short Term Goals - 09/24/15 1038      PT SHORT TERM GOAL #1   Title Pt will be independent with HEP for improved posture, transfers, gait.  TARGET 09/28/15   Time 4   Period Weeks   Status Achieved     PT SHORT TERM GOAL #2   Title Pt will improve 5x sit<>stand trasnfers to less than or equal to 20 seocnds for improved efficiency and safety with transfers.   Baseline 20.63 sec   Time 4   Period Weeks   Status Not Met     PT SHORT TERM GOAL #3   Title Pt will improve TUG score to less than or equal to 16 seconds for decreased fall risk.   Baseline 18.28 sec at best   Time 4   Period Weeks   Status Not Met     PT SHORT TERM GOAL #4   Title Pt will verbalize at least 3 means to manage pain in low back for improved involvement in functional activities.   Baseline verbalizes positioning, exercises, and wedge in shoe    Time 4   Period Weeks   Status Achieved           PT Long Term Goals - 10/28/15 1555      PT LONG TERM GOAL #1   Title Pt will verbalize understanding of fall prevention and tips to reduce freezing of gait within home environment.  TARGET 10/29/15   Time 8   Period Weeks   Status Achieved     PT LONG TERM GOAL #2   Title Pt will report at least 25% improvement in bed mobility.   Baseline Pt reports >25% improvement in bed mobility due to techniques taught during sessions.   Time 8   Period Weeks   Status Achieved     PT LONG TERM GOAL #3   Title Pt will improve TUG cognitive to less than or equal to 20 seconds for decreased fall risk/improved ability for dual tasking.   Baseline 14.6 on 10/28/15 with Rollator   Time 8   Period Weeks   Status Achieved     PT LONG TERM GOAL #4   Title Pt will perform  floor>stand transfer with UE support, modified independently, for safe fall recovery.   Time 8   Period Weeks  Status Achieved               Plan - 11-21-2015 1558    Clinical Impression Statement Pt met all LTG's.  Agrees to d/c at this time due to having decreased motivation and increased depression since Dr Bonney Aid changed medication.  Pt did not want to set up screen in future at this time due to her work schedule and stated she would let Dr Tat know when she felt like she needed to return for therapy.  Discharge per Mady Haagensen, PT.   PT Duration --  plus eval   PT Next Visit Plan Discharge from PT.   Consulted and Agree with Plan of Care Patient      Patient will benefit from skilled therapeutic intervention in order to improve the following deficits and impairments:     Visit Diagnosis: Other abnormalities of gait and mobility  Unsteadiness on feet  Abnormal posture  Other symptoms and signs involving the nervous system  Postural instability       G-Codes - 2015/11/21 1602    Functional Assessment Tool Used 5c sit<>stand 17.97 sec, TUG 14.6 sec, TUG cognitive 14.75 sec, gait velocity 2.25 ft/second      Problem List Patient Active Problem List   Diagnosis Date Noted  . Trochanteric bursitis of both hips 09/13/2015  . Chronic low back pain 06/07/2015  . Fibromyalgia syndrome 05/17/2015  . Hypothyroidism 05/17/2015  . Parkinson's disease ([place]) 05/17/2015  . Hypercoagulable state ([place]) 02/07/2012  . Embolism ([place]) 02/07/2012  . S/P right TKA 05/22/2011    Narda Bonds 2015-11-21, 4:04 PM  [facility] [address] [place], [place], [address] Phone: [phone]   Fax:  [phone]  Name: [name] [name] MRN: [id number] Date of Birth: 08/05/1945  Narda Bonds, [place] 11-21-15 4:04 PM Phone: [phone] Fax:  [phone]    PHYSICAL THERAPY DISCHARGE SUMMARY  Visits from Start of Care: 8  Current functional level related to goals / functional outcomes:     PT Long Term Goals - 11-21-2015 1555      PT LONG TERM GOAL #1   Title Pt will verbalize understanding of fall prevention and tips to reduce freezing of gait within home environment.  TARGET 10/29/15   Time 8   Period Weeks   Status Achieved     PT LONG TERM GOAL #2   Title Pt will report at least 25% improvement in bed mobility.   Baseline Pt reports >25% improvement in bed mobility due to techniques taught during sessions.   Time 8   Period Weeks   Status Achieved     PT LONG TERM GOAL #3   Title Pt will improve TUG cognitive to less than or equal to 20 seconds for decreased fall risk/improved ability for dual tasking.   Baseline 14.6 on November 21, 2015 with Rollator   Time 8   Period Weeks   Status Achieved     PT LONG TERM GOAL #4   Title Pt will perform floor>stand transfer with UE support, modified independently, for safe fall recovery.   Time 8   Period Weeks   Status Achieved       Remaining deficits: Balance, posture, fear of falling    Education / Equipment: Pt has been educated in HEP, fall prevention, with pt verbalizing understanding. Plan: Patient agrees to discharge.  Patient goals were met. Patient is being discharged due to meeting the stated rehab goals.  ?????Pt  has had multiple episodes of tearfulness during therapy sessions.  Have encouraged pt to speak to Dr. [name] regarding neuropsychology or Dr. Letta Pate regarding a recent change in medications.  Pt may benefit from return screen or eval in 6 months due to progressive nature of disease.           Mady Haagensen, PT 11/02/15 2:24 PM Phone: [phone] Fax: [phone]

## 2015-10-28 NOTE — Patient Instructions (Signed)
It is important to avoid accidents which may result in broken bones.  Here are a few ideas on how to make your home safer so you will be less likely to trip or fall.  1. Use nonskid mats or non slip strips in your shower or tub, on your bathroom floor and around sinks.  If you know that you have spilled water, wipe it up! 2. In the bathroom, it is important to have properly installed grab bars on the walls or on the edge of the tub.  Towel racks are NOT strong enough for you to hold onto or to pull on for support. 3. Stairs and hallways should have enough light.  Add lamps or night lights if you need ore light. 4. It is good to have handrails on both sides of the stairs if possible.  Always fix broken handrails right away. 5. It is important to see the edges of steps.  Paint the edges of outdoor steps white so you can see them better.  Put colored tape on the edge of inside steps. 6. Throw-rugs are dangerous because they can slide.  Removing the rugs is the best idea, but if they must stay, add adhesive carpet tape to prevent slipping. 7. Do not keep things on stairs or in the halls.  Remove small furniture that blocks the halls as it may cause you to trip.  Keep telephone and electrical cords out of the way where you walk. 8. Always were sturdy, rubber-soled shoes for good support.  Never wear just socks, especially on the stairs.  Socks may cause you to slip or fall.  Do not wear full-length housecoats as you can easily trip on the bottom.  9. Place the things you use the most on the shelves that are the easiest to reach.  If you use a stepstool, make sure it is in good condition.  If you feel unsteady, DO NOT climb, ask for help. 10. If a health professional advises you to use a cane or walker, do not be ashamed.  These items can keep you from falling and breaking your bones.   Fall Prevention in the Home  Falls can cause injuries and can affect people from all age groups. There are many simple  things that you can do to make your home safe and to help prevent falls. WHAT CAN I DO ON THE OUTSIDE OF MY HOME?  Regularly repair the edges of walkways and driveways and fix any cracks.  Remove high doorway thresholds.  Trim any shrubbery on the main path into your home.  Use bright outdoor lighting.  Clear walkways of debris and clutter, including tools and rocks.  Regularly check that handrails are securely fastened and in good repair. Both sides of any steps should have handrails.  Install guardrails along the edges of any raised decks or porches.  Have leaves, snow, and ice cleared regularly.  Use sand or salt on walkways during winter months.  In the garage, clean up any spills right away, including grease or oil spills. WHAT CAN I DO IN THE BATHROOM?  Use night lights.  Install grab bars by the toilet and in the tub and shower. Do not use towel bars as grab bars.  Use non-skid mats or decals on the floor of the tub or shower.  If you need to sit down while you are in the shower, use a plastic, non-slip stool.[place] Kitchen  Keep the floor dry. Immediately clean up any water that spills on  the floor.  Remove soap buildup in the tub or shower on a regular basis.  Attach bath mats securely with double-sided non-slip rug tape.  Remove throw rugs and other tripping hazards from the floor. WHAT CAN I DO IN THE BEDROOM?  Use night lights.  Make sure that a bedside light is easy to reach.  Do not use oversized bedding that drapes onto the floor.  Have a firm chair that has side arms to use for getting dressed.  Remove throw rugs and other tripping hazards from the floor. WHAT CAN I DO IN THE KITCHEN?   Clean up any spills right away.  Avoid walking on wet floors.  Place frequently used items in easy-to-reach places.  If you need to reach for something above you, use a sturdy step stool that has a grab bar.  Keep electrical cables out of the way.  Do not use floor  polish or wax that makes floors slippery. If you have to use wax, make sure that it is non-skid floor wax.  Remove throw rugs and other tripping hazards from the floor. WHAT CAN I DO IN THE STAIRWAYS?  Do not leave any items on the stairs.  Make sure that there are handrails on both sides of the stairs. Fix handrails that are broken or loose. Make sure that handrails are as long as the stairways.  Check any carpeting to make sure that it is firmly attached to the stairs. Fix any carpet that is loose or worn.  Avoid having throw rugs at the top or bottom of stairways, or secure the rugs with carpet tape to prevent them from moving.  Make sure that you have a light switch at the top of the stairs and the bottom of the stairs. If you do not have them, have them installed. WHAT ARE SOME OTHER FALL PREVENTION TIPS?  Wear closed-toe shoes that fit well and support your feet. Wear shoes that have rubber soles or low heels.  When you use a stepladder, make sure that it is completely opened and that the sides are firmly locked. Have someone hold the ladder while you are using it. Do not climb a closed stepladder.  Add color or contrast paint or tape to grab bars and handrails in your home. Place contrasting color strips on the first and last steps.  Use mobility aids as needed, such as canes, walkers, scooters, and crutches.  Turn on lights if it is dark. Replace any light bulbs that burn out.  Set up furniture so that there are clear paths. Keep the furniture in the same spot.  Fix any uneven floor surfaces.  Choose a carpet design that does not hide the edge of steps of a stairway.  Be aware of any and all pets.  Review your medicines with your healthcare provider. Some medicines can cause dizziness or changes in blood pressure, which increase your risk of falling. Talk with your health care provider about other ways that you can decrease your risk of falls. This may include working with  a physical therapist or trainer to improve your strength, balance, and endurance.   This information is not intended to replace advice given to you by your health care provider. Make sure you discuss any questions you have with your health care provider.   Document Released: 02/10/2002 Document Revised: 07/07/2014 Document Reviewed: 03/27/2014 Elsevier Interactive Patient Education [employer].

## 2015-10-28 NOTE — Telephone Encounter (Signed)
Dr. Letta Pate, I am the primary physical therapist for [name] [name].  She has been very emotional over the past several weeks during therapy sessions, often tearful, to the point of not being able to progress with therapy activities.  She reports that you may have changed some of her medications (Cymbalta?) earlier this year, and I wanted to ask if she may be able to see you earlier than her current scheduled appointment with you to address her medications.  We have discussed possibility of neuropsychology referral as well.  Could you please advise?  Thank you,  Mady Haagensen, PT

## 2015-10-29 ENCOUNTER — Ambulatory Visit (INDEPENDENT_AMBULATORY_CARE_PROVIDER_SITE_OTHER): Payer: Medicare Other | Admitting: *Deleted

## 2015-10-29 ENCOUNTER — Telehealth: Payer: Self-pay | Admitting: Neurology

## 2015-10-29 DIAGNOSIS — D6859 Other primary thrombophilia: Secondary | ICD-10-CM

## 2015-10-29 LAB — POCT INR: INR: 2.5

## 2015-10-29 NOTE — Telephone Encounter (Signed)
Patient made aware.

## 2015-10-29 NOTE — Progress Notes (Signed)
Pre visit review using our clinic review tool, if applicable. No additional management support is needed unless otherwise documented below in the visit note.  INR today 2.5. Patient findings negative.   Continue taking 4 mg daily, except take 6 mg on Wednesdays. Recheck INR in 4 weeks  Next appointment 11/24/15.

## 2015-10-29 NOTE — Telephone Encounter (Signed)
-----   Message from [place], DO sent at 10/28/2015  7:07 PM EDT ----- Let pt know that u/s looks normal

## 2015-11-02 ENCOUNTER — Ambulatory Visit: Payer: Medicare Other | Admitting: Physical Therapy

## 2015-11-03 ENCOUNTER — Ambulatory Visit: Payer: Medicare Other | Admitting: Physical Therapy

## 2015-11-05 DIAGNOSIS — M25552 Pain in left hip: Secondary | ICD-10-CM | POA: Diagnosis not present

## 2015-11-05 DIAGNOSIS — M25551 Pain in right hip: Secondary | ICD-10-CM | POA: Diagnosis not present

## 2015-11-09 ENCOUNTER — Telehealth: Payer: Self-pay | Admitting: Family Medicine

## 2015-11-09 NOTE — Telephone Encounter (Signed)
°  Relationship to patient: Self  Can be reached: [phone]   Pharmacy:  [facility], [place] [phone] (Phone) [phone] (Fax)    Reason for call: Patient states she dropped her medication in the sink on Friday night while getting water to take medicine. Needs new Rx for zolpidem (AMBIEN) 10 MG tablet [id number]

## 2015-11-10 MED ORDER — ZOLPIDEM TARTRATE 10 MG PO TABS: 10.0000 mg | ORAL_TABLET | Freq: Every day | ORAL | 2 refills | Status: DC

## 2015-11-10 NOTE — Telephone Encounter (Signed)
Called her back- she has been on ambien 10 mg for several years since her husband passed away. She last got #30 on 8/15- let her know that her insurance may not pay for a RF now, but she is ok paying cash as she "cannot sleep at all without it.'  I'll call her pharm for her now.    Called [facility] but was put on extensive hold, called back and could not get anyone to answer.  Left detailed message for them and my cell phone number if any questions  Meds ordered this encounter  Medications  . zolpidem (AMBIEN) 10 MG tablet    Sig: Take 1 tablet (10 mg total) by mouth at bedtime.    Dispense:  30 tablet    Refill:  2

## 2015-11-15 ENCOUNTER — Encounter: Payer: Self-pay | Admitting: Physical Medicine & Rehabilitation

## 2015-11-15 ENCOUNTER — Ambulatory Visit (HOSPITAL_BASED_OUTPATIENT_CLINIC_OR_DEPARTMENT_OTHER): Payer: Medicare Other | Admitting: Physical Medicine & Rehabilitation

## 2015-11-15 ENCOUNTER — Encounter: Payer: Medicare Other | Attending: Physical Medicine & Rehabilitation

## 2015-11-15 VITALS — BP 135/83 | HR 95 | Resp 14

## 2015-11-15 DIAGNOSIS — G20 Parkinson's disease: Secondary | ICD-10-CM | POA: Diagnosis not present

## 2015-11-15 DIAGNOSIS — M1990 Unspecified osteoarthritis, unspecified site: Secondary | ICD-10-CM | POA: Insufficient documentation

## 2015-11-15 DIAGNOSIS — Z87891 Personal history of nicotine dependence: Secondary | ICD-10-CM | POA: Insufficient documentation

## 2015-11-15 DIAGNOSIS — M797 Fibromyalgia: Secondary | ICD-10-CM

## 2015-11-15 DIAGNOSIS — E079 Disorder of thyroid, unspecified: Secondary | ICD-10-CM | POA: Diagnosis not present

## 2015-11-15 DIAGNOSIS — I10 Essential (primary) hypertension: Secondary | ICD-10-CM | POA: Insufficient documentation

## 2015-11-15 DIAGNOSIS — F5104 Psychophysiologic insomnia: Secondary | ICD-10-CM | POA: Insufficient documentation

## 2015-11-15 DIAGNOSIS — G8929 Other chronic pain: Secondary | ICD-10-CM

## 2015-11-15 DIAGNOSIS — H409 Unspecified glaucoma: Secondary | ICD-10-CM | POA: Diagnosis not present

## 2015-11-15 DIAGNOSIS — Z9889 Other specified postprocedural states: Secondary | ICD-10-CM | POA: Diagnosis not present

## 2015-11-15 DIAGNOSIS — E7800 Pure hypercholesterolemia, unspecified: Secondary | ICD-10-CM | POA: Insufficient documentation

## 2015-11-15 DIAGNOSIS — M545 Low back pain, unspecified: Secondary | ICD-10-CM

## 2015-11-15 DIAGNOSIS — I749 Embolism and thrombosis of unspecified artery: Secondary | ICD-10-CM | POA: Diagnosis not present

## 2015-11-15 MED ORDER — DULOXETINE HCL 60 MG PO CPEP: 60.0000 mg | ORAL_CAPSULE | Freq: Every day | ORAL | 0 refills | Status: DC

## 2015-11-15 NOTE — Patient Instructions (Signed)
Increased dose of Cymbalta may take up to 2 months to take full effect
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
## 2015-11-22 DIAGNOSIS — H02831 Dermatochalasis of right upper eyelid: Secondary | ICD-10-CM | POA: Diagnosis not present

## 2015-11-22 DIAGNOSIS — Z9841 Cataract extraction status, right eye: Secondary | ICD-10-CM | POA: Diagnosis not present

## 2015-11-22 DIAGNOSIS — Z961 Presence of intraocular lens: Secondary | ICD-10-CM | POA: Diagnosis not present

## 2015-11-22 DIAGNOSIS — Z881 Allergy status to other antibiotic agents status: Secondary | ICD-10-CM | POA: Diagnosis not present

## 2015-11-22 DIAGNOSIS — H532 Diplopia: Secondary | ICD-10-CM | POA: Diagnosis not present

## 2015-11-22 DIAGNOSIS — Z9842 Cataract extraction status, left eye: Secondary | ICD-10-CM | POA: Diagnosis not present

## 2015-11-22 DIAGNOSIS — Z888 Allergy status to other drugs, medicaments and biological substances status: Secondary | ICD-10-CM | POA: Diagnosis not present

## 2015-11-22 DIAGNOSIS — E0500 Thyrotoxicosis with diffuse goiter without thyrotoxic crisis or storm: Secondary | ICD-10-CM | POA: Diagnosis not present

## 2015-11-22 DIAGNOSIS — G20 Parkinson's disease: Secondary | ICD-10-CM | POA: Diagnosis not present

## 2015-11-22 DIAGNOSIS — H43813 Vitreous degeneration, bilateral: Secondary | ICD-10-CM | POA: Diagnosis not present

## 2015-11-22 DIAGNOSIS — I10 Essential (primary) hypertension: Secondary | ICD-10-CM | POA: Diagnosis not present

## 2015-11-22 DIAGNOSIS — H02834 Dermatochalasis of left upper eyelid: Secondary | ICD-10-CM | POA: Diagnosis not present

## 2015-11-22 DIAGNOSIS — H40003 Preglaucoma, unspecified, bilateral: Secondary | ICD-10-CM | POA: Diagnosis not present

## 2015-11-22 DIAGNOSIS — Z87891 Personal history of nicotine dependence: Secondary | ICD-10-CM | POA: Diagnosis not present

## 2015-11-24 ENCOUNTER — Ambulatory Visit (INDEPENDENT_AMBULATORY_CARE_PROVIDER_SITE_OTHER): Payer: Medicare Other | Admitting: Family Medicine

## 2015-11-24 ENCOUNTER — Ambulatory Visit (HOSPITAL_BASED_OUTPATIENT_CLINIC_OR_DEPARTMENT_OTHER)
Admission: RE | Admit: 2015-11-24 | Discharge: 2015-11-24 | Disposition: A | Discharge status: Home / Self Care | Payer: Medicare Other | Source: Ambulatory Visit | Attending: Ophthalmology | Admitting: Ophthalmology

## 2015-11-24 ENCOUNTER — Encounter: Payer: Self-pay | Admitting: Family Medicine

## 2015-11-24 ENCOUNTER — Other Ambulatory Visit (HOSPITAL_BASED_OUTPATIENT_CLINIC_OR_DEPARTMENT_OTHER): Payer: Self-pay | Admitting: Ophthalmology

## 2015-11-24 VITALS — BP 118/78 | HR 80 | Temp 97.6°F | Ht 65.5 in | Wt 197.2 lb

## 2015-11-24 DIAGNOSIS — H532 Diplopia: Secondary | ICD-10-CM | POA: Diagnosis not present

## 2015-11-24 DIAGNOSIS — I749 Embolism and thrombosis of unspecified artery: Secondary | ICD-10-CM

## 2015-11-24 DIAGNOSIS — H5789 Other specified disorders of eye and adnexa: Secondary | ICD-10-CM

## 2015-11-24 DIAGNOSIS — Z130 Encounter for screening for diseases of the blood and blood-forming organs and certain disorders involving the immune mechanism: Secondary | ICD-10-CM

## 2015-11-24 DIAGNOSIS — E0500 Thyrotoxicosis with diffuse goiter without thyrotoxic crisis or storm: Secondary | ICD-10-CM

## 2015-11-24 DIAGNOSIS — E559 Vitamin D deficiency, unspecified: Secondary | ICD-10-CM

## 2015-11-24 DIAGNOSIS — Z119 Encounter for screening for infectious and parasitic diseases, unspecified: Secondary | ICD-10-CM | POA: Diagnosis not present

## 2015-11-24 DIAGNOSIS — E034 Atrophy of thyroid (acquired): Secondary | ICD-10-CM | POA: Diagnosis not present

## 2015-11-24 DIAGNOSIS — Z8639 Personal history of other endocrine, nutritional and metabolic disease: Secondary | ICD-10-CM | POA: Insufficient documentation

## 2015-11-24 DIAGNOSIS — Z131 Encounter for screening for diabetes mellitus: Secondary | ICD-10-CM | POA: Diagnosis not present

## 2015-11-24 DIAGNOSIS — G20 Parkinson's disease: Secondary | ICD-10-CM

## 2015-11-24 DIAGNOSIS — E785 Hyperlipidemia, unspecified: Secondary | ICD-10-CM | POA: Diagnosis not present

## 2015-11-24 DIAGNOSIS — Z23 Encounter for immunization: Secondary | ICD-10-CM

## 2015-11-24 DIAGNOSIS — D6859 Other primary thrombophilia: Secondary | ICD-10-CM

## 2015-11-24 DIAGNOSIS — R7309 Other abnormal glucose: Secondary | ICD-10-CM

## 2015-11-24 DIAGNOSIS — E079 Disorder of thyroid, unspecified: Secondary | ICD-10-CM

## 2015-11-24 DIAGNOSIS — H538 Other visual disturbances: Secondary | ICD-10-CM | POA: Diagnosis not present

## 2015-11-24 DIAGNOSIS — E038 Other specified hypothyroidism: Secondary | ICD-10-CM | POA: Diagnosis not present

## 2015-11-24 LAB — LIPID PANEL
CHOLESTEROL: 160 mg/dL (ref 0–200)
HDL: 58.9 mg/dL (ref >39.00)
LDL Cholesterol: 80 mg/dL (ref 0–99)
NonHDL: 100.92
TRIGLYCERIDES: 105 mg/dL (ref 0.0–149.0)
Total CHOL/HDL Ratio: 3
VLDL: 21 mg/dL (ref 0.0–40.0)

## 2015-11-24 LAB — CBC
HCT: 42.9 % (ref 36.0–46.0)
HEMOGLOBIN: 14.5 g/dL (ref 12.0–15.0)
MCHC: 33.9 g/dL (ref 30.0–36.0)
MCV: 86.7 fl (ref 78.0–100.0)
PLATELETS: 327 10*3/uL (ref 150.0–400.0)
RBC: 4.95 Mil/uL (ref 3.87–5.11)
RDW: 13.5 % (ref 11.5–15.5)
WBC: 9.5 10*3/uL (ref 4.0–10.5)

## 2015-11-24 LAB — COMPREHENSIVE METABOLIC PANEL
ALK PHOS: 79 U/L (ref 39–117)
ALT: 14 U/L (ref 0–35)
AST: 22 U/L (ref 0–37)
Albumin: 3.5 g/dL (ref 3.5–5.2)
BILIRUBIN TOTAL: 0.5 mg/dL (ref 0.2–1.2)
BUN: 16 mg/dL (ref 6–23)
CO2: 31 mEq/L (ref 19–32)
CREATININE: 0.68 mg/dL (ref 0.40–1.20)
Calcium: 9 mg/dL (ref 8.4–10.5)
Chloride: 101 mEq/L (ref 96–112)
GFR: 90.22 mL/min (ref >60.00)
GLUCOSE: 115 mg/dL — AB (ref 70–99)
Potassium: 3.5 mEq/L (ref 3.5–5.1)
Sodium: 139 mEq/L (ref 135–145)
TOTAL PROTEIN: 6.5 g/dL (ref 6.0–8.3)

## 2015-11-24 LAB — POCT INR: INR: 4.3

## 2015-11-24 LAB — VITAMIN D 25 HYDROXY (VIT D DEFICIENCY, FRACTURES): VITD: 43.21 ng/mL (ref 30.00–100.00)

## 2015-11-24 LAB — TSH: TSH: 0.61 u[IU]/mL (ref 0.35–4.50)

## 2015-11-24 NOTE — Patient Instructions (Addendum)
Please come in for a nurse visit in one week for an INR check only- schedule this today Your INR is a bit too high today- not dangerously so but we need to watch it closely Please do not take your dose today; tomorrow start back on the coumadin, at 4mg  daily.  Do not take the once weekly 6 mg dose.   I will be in touch with your other labs asap!  It was great to see you today  Please do a records release for your records from [place] at check out today

## 2015-11-24 NOTE — Progress Notes (Addendum)
[place] [facility] [address], [place], [place] [address] [phone]  Date:  11/24/2015   Name:  [name] [name]   DOB:  Aug 11, 1945   MRN:  [id number]  PCP:  Lamar Blinks, MD    Chief Complaint: Follow-up (Pt here for f/u visit, will need INR, fasting labs and flu shot. )   History of Present Illness:  [name] [name] is a 70 y.o. very pleasant female patient who presents with the following:  She is on chronic anticoagulation for history of PE- uses coumadin.  She is fasting today for labs.   She is on coumadin 4- she takes 4mg  every day except for wednesdays when she takes 6 mg No nosebleeds, blood in stool or urine Results for orders placed or performed in visit on 11/24/15  POCT INR  Result Value Ref Range   INR 4.3    INR is high today- pt denies any change in her usual dosage or any new meds, but admits she has not eaten may vegetables lately Total weekly coumadin 30 mg.  Will have her hold one dose and then decrease total weekly dose by about 10%- will have her take 4mg  each day and d/c the once a week 6 mg dose Flu shot today She feels "like somebody who has parkinson's disease."  She is using her walker to help prevent falls.   She does her ADL ok at home, she does still drive as long as "my eyes are not bothering me"  Lab Results  Component Value Date   INR 2.5 10/29/2015   INR 2.6 10/08/2015   INR 2.3 09/22/2015   PROTIME 32.4 (H) 02/14/2011   PROTIME 36.0 (H) 07/26/2010     Patient Active Problem List   Diagnosis Date Noted  . Trochanteric bursitis of both hips 09/13/2015  . Chronic low back pain 06/07/2015  . Fibromyalgia syndrome 05/17/2015  . Hypothyroidism 05/17/2015  . Parkinson's disease ([place]) 05/17/2015  . Hypercoagulable state ([place]) 02/07/2012  . Embolism ([place]) 02/07/2012  . S/P right TKA 05/22/2011    Past Medical History:  Diagnosis Date  . Arthritis   . Chronic insomnia   .  Complication of anesthesia    severe itching night of surgery requiring meds- also couldnt swallow- throat "paralyzed""  . Fibromyalgia   . Glaucoma   . Hypercholesterolemia   . Hyperlipidemia   . Hypertension    PCP Dr Cari Caraway- [place]  05/15/11 with clearance and note on chart,    chest x ray, EKG 12/12 EPIC, eccho 7/11 EPIC  . Hypothyroidism    s/p graves disease  . Kidney stone   . PD (Parkinson's disease) ([place])   . Peripheral vascular disease ([place])    PULMONARY EMBOLUS x 2- 2011, 2012/ FOLLOWED BY DR ODOGWU-LOV 12/12 EPIC   PT STAES WILL STOP COUMADIN 3/12 and begin LOVONOX 05/17/11 as previously instructed  . Sinus infection    at present- dr Honor Loh PA aware- was seen there and at PCP 05/10/11  . Thyroid disease    Hypothyroidism    Past Surgical History:  Procedure Laterality Date  . ABDOMINAL HYSTERECTOMY    . APPENDECTOMY    . CATARACT EXTRACTION Bilateral   . CHOLECYSTECTOMY    . COLONOSCOPY    . EXPLORATORY LAPAROTOMY    . JOINT REPLACEMENT     left knee  7/12  . TOTAL KNEE ARTHROPLASTY  05/22/2011   Procedure: TOTAL KNEE ARTHROPLASTY;  Surgeon:  Mauri Pole, MD;  Location: [facility];  Service: Orthopedics;  Laterality: Right;    Social History  Substance Use Topics  . Smoking status: Former Smoker    Packs/day: 1.00    Quit date: 05/10/1989  . Smokeless tobacco: Never Used  . Alcohol use 0.0 oz/week     Comment: one drink once a month    Family History  Problem Relation Age of Onset  . Prostate cancer Father   . Stroke Father     Allergies  Allergen Reactions  . Crestor [Rosuvastatin Calcium] Other (See Comments)    Feeling very bad, aching all over.  . Erythromycin Hives  . Pregabalin Other (See Comments)    Crossed vision.  Marland Kitchen Macrobid [Nitrofurantoin] Hives  . Carbidopa     Headaches, nausea, dizziness  . Gabapentin Other (See Comments)    Blurred vision  . Losartan     Medication list has been reviewed and updated.  Current Outpatient  Prescriptions on File Prior to Visit  Medication Sig Dispense Refill  . acetaminophen (TYLENOL) 325 MG tablet Take 650 mg by mouth every 6 (six) hours as needed.    Marland Kitchen aspirin 81 MG tablet Take 81 mg by mouth daily after breakfast.     . azelastine (ASTELIN) 0.1 % nasal spray Place 1 spray into both nostrils 2 (two) times daily. Use in each nostril as directed    . B Complex-C (SUPER B COMPLEX PO) Take 1 tablet by mouth at bedtime.     . Calcium Carbonate-Vitamin D (CALCIUM + D PO) Take 2 tablets by mouth at bedtime. 600-200 mg tab.    . Carbidopa-Levodopa ER (RYTARY) 23.75-95 MG CPCR Take 1 tablet by mouth 3 (three) times daily. 100 capsule 0  . Cholecalciferol (VITAMIN D3) 5000 UNITS CAPS Take 5,000 Units by mouth daily.     . DULoxetine (CYMBALTA) 60 MG capsule Take 1 capsule (60 mg total) by mouth daily. 30 capsule 5  . DULoxetine (CYMBALTA) 60 MG capsule Take 1 capsule (60 mg total) by mouth daily. 90 capsule 0  . fluocinonide (LIDEX) 0.05 % external solution Apply to scalp as needed    . hydrocortisone valerate cream (WESTCORT) 0.2 % Apply topically as needed    . Magnesium 250 MG TABS Take 1 tablet by mouth at bedtime.     . Multiple Vitamin (MULTIVITAMIN) tablet Take 1 tablet by mouth daily.     . Omega-3 Fatty Acids (FISH OIL) 1200 MG CAPS Take 2 capsules by mouth at bedtime.     Marland Kitchen SYNTHROID 150 MCG tablet Take 150 mcg by mouth daily before breakfast.     . telmisartan-hydrochlorothiazide (MICARDIS HCT) 40-12.5 MG per tablet Take 1 tablet by mouth daily.   1  . warfarin (COUMADIN) 5 MG tablet Take 4 mg by mouth daily. 1 tab Monday, Wednesday, Friday and Saturday. 1/2 tab Tuesday, Thursday and Sunday    . zolpidem (AMBIEN) 10 MG tablet Take 1 tablet (10 mg total) by mouth at bedtime. 30 tablet 2   No current facility-administered medications on file prior to visit.     Review of Systems:  As per HPI- otherwise negative.   Physical Examination: Vitals:   11/24/15 1016  BP:  118/78  Pulse: 80  Temp: 97.6 F (36.4 C)   Vitals:   11/24/15 1016  Weight: 197 lb 3.2 oz (89.4 kg)  Height: 5' 5.5" (1.664 m)   Body mass index is 32.32 kg/m. Ideal Body Weight: Weight in (lb) to  have BMI = 25: 152.2  GEN: WDWN, NAD, Non-toxic, A & O x 3, overweight, looks well HEENT: Atraumatic, Normocephalic. Neck supple. No masses, No LAD. Ears and Nose: No external deformity. CV: RRR, No M/G/R. No JVD. No thrill. No extra heart sounds. PULM: CTA B, no wheezes, crackles, rhonchi. No retractions. No resp. distress. No accessory muscle use. ABD: S, NT, ND, +BS. No rebound. No HSM. EXTR: No c/c/e NEURO Normal gait for pt, uses a walker She has minimal resting tremor of both hands better with intentional movement PSYCH: Normally interactive. Conversant. Not depressed or anxious appearing.  Calm demeanor.    Assessment and Plan: Primary hypercoagulable state ([place]) - Plan: POCT INR, CBC  Parkinson's disease ([place])  Vitamin D deficiency - Plan: Vitamin D (25 hydroxy)  Screening for deficiency anemia - Plan: CBC  Screening for diabetes mellitus - Plan: Comprehensive metabolic panel  Hypothyroidism due to acquired atrophy of thyroid - Plan: TSH  Dyslipidemia - Plan: Lipid panel  Screening examination for infectious disease - Plan: Hepatitis C antibody  Adjusted her coumadin as above, re check INR in one week Await her labs as above I still do not have her records from [place] to update her health maint Signed Lamar Blinks, MD  Results for orders placed or performed in visit on 11/24/15  CBC  Result Value Ref Range   WBC 9.5 4.0 - 10.5 K/uL   RBC 4.95 3.87 - 5.11 Mil/uL   Platelets 327.0 150.0 - 400.0 K/uL   Hemoglobin 14.5 12.0 - 15.0 g/dL   HCT 42.9 36.0 - 46.0 %   MCV 86.7 78.0 - 100.0 fl   MCHC 33.9 30.0 - 36.0 g/dL   RDW 13.5 11.5 - 15.5 %  Comprehensive metabolic panel  Result Value Ref Range   Sodium 139 135 - 145 mEq/L   Potassium 3.5 3.5 - 5.1 mEq/L    Chloride 101 96 - 112 mEq/L   CO2 31 19 - 32 mEq/L   Glucose, Bld 115 (H) 70 - 99 mg/dL   BUN 16 6 - 23 mg/dL   Creatinine, Ser 0.68 0.40 - 1.20 mg/dL   Total Bilirubin 0.5 0.2 - 1.2 mg/dL   Alkaline Phosphatase 79 39 - 117 U/L   AST 22 0 - 37 U/L   ALT 14 0 - 35 U/L   Total Protein 6.5 6.0 - 8.3 g/dL   Albumin 3.5 3.5 - 5.2 g/dL   Calcium 9.0 8.4 - 10.5 mg/dL   GFR 90.22 >60.00 mL/min  TSH  Result Value Ref Range   TSH 0.61 0.35 - 4.50 uIU/mL  Lipid panel  Result Value Ref Range   Cholesterol 160 0 - 200 mg/dL   Triglycerides 105.0 0.0 - 149.0 mg/dL   HDL 58.90 >39.00 mg/dL   VLDL 21.0 0.0 - 40.0 mg/dL   LDL Cholesterol 80 0 - 99 mg/dL   Total CHOL/HDL Ratio 3    NonHDL 100.92   Vitamin D (25 hydroxy)  Result Value Ref Range   VITD 43.21 30.00 - 100.00 ng/mL  Hepatitis C antibody  Result Value Ref Range   HCV Ab NEGATIVE NEGATIVE  POCT INR  Result Value Ref Range   INR 4.3    Added on future A1c to follow-up glucose, can draw when she has repeat INR next week

## 2015-11-25 ENCOUNTER — Encounter: Payer: Self-pay | Admitting: Family Medicine

## 2015-11-25 LAB — HEPATITIS C ANTIBODY: HCV AB: NEGATIVE

## 2015-11-25 NOTE — Addendum Note (Signed)
Addended by: Lamar Blinks C on: 11/25/2015 05:25 AM   Modules accepted: Orders

## 2015-12-01 ENCOUNTER — Ambulatory Visit (INDEPENDENT_AMBULATORY_CARE_PROVIDER_SITE_OTHER): Payer: Medicare Other | Admitting: Behavioral Health

## 2015-12-01 DIAGNOSIS — D6859 Other primary thrombophilia: Secondary | ICD-10-CM

## 2015-12-01 LAB — POCT INR: INR: 2.6

## 2015-12-01 NOTE — Patient Instructions (Signed)
Per Dr. Lorelei Pont: Continue taking Coumadin 4 mg daily. Recheck INR in 2 weeks.

## 2015-12-01 NOTE — Progress Notes (Signed)
Pre visit review using our clinic review tool, if applicable. No additional management support is needed unless otherwise documented below in the visit note.  Patient presents in office for INR check. Reviewed medication and current regimen with the patient. She did not report any positive findings. Today's reading is 2.6.  Per Dr. Lorelei Pont: Continue taking Coumadin 4 mg daily. Recheck INR in 2 weeks.  Informed patient of the provider's instructions. She verbalized understanding. Next appointment has been scheduled for 12/15/15 at 10:30 AM.

## 2015-12-15 ENCOUNTER — Ambulatory Visit (INDEPENDENT_AMBULATORY_CARE_PROVIDER_SITE_OTHER): Payer: Medicare Other | Admitting: Behavioral Health

## 2015-12-15 DIAGNOSIS — D6859 Other primary thrombophilia: Secondary | ICD-10-CM

## 2015-12-15 LAB — POCT INR: INR: 3

## 2015-12-15 NOTE — Patient Instructions (Signed)
Per Dr. Lorelei Pont: Continue taking Coumadin 4 mg daily. Recheck INR in 3 weeks.

## 2015-12-15 NOTE — Progress Notes (Signed)
Pre visit review using our clinic review tool, if applicable. No additional management support is needed unless otherwise documented below in the visit note.  Patient in clinic for INR check. Reviewed medication & current regimen with the patient. She did not report any positive findings. Today's reading was 3.0.  Per Dr. Lorelei Pont: Continue taking Coumadin 4 mg daily. Recheck INR in 3 weeks.  Patient was made aware of the provider's instructions and voiced understanding.  Next appointment scheduled for 01/05/16 at 10:30 AM.

## 2015-12-17 ENCOUNTER — Inpatient Hospital Stay (HOSPITAL_COMMUNITY)
Admission: EM | Admit: 2015-12-17 | Discharge: 2015-12-20 | DRG: 551 | Disposition: A | Discharge status: Rehabilitation Facility | Payer: Medicare Other | Attending: Internal Medicine | Admitting: Internal Medicine

## 2015-12-17 ENCOUNTER — Emergency Department (HOSPITAL_COMMUNITY): Payer: Medicare Other

## 2015-12-17 ENCOUNTER — Encounter (HOSPITAL_COMMUNITY): Payer: Self-pay | Admitting: Emergency Medicine

## 2015-12-17 DIAGNOSIS — Z9181 History of falling: Secondary | ICD-10-CM | POA: Diagnosis not present

## 2015-12-17 DIAGNOSIS — Z87891 Personal history of nicotine dependence: Secondary | ICD-10-CM

## 2015-12-17 DIAGNOSIS — M545 Low back pain, unspecified: Secondary | ICD-10-CM | POA: Diagnosis present

## 2015-12-17 DIAGNOSIS — S12400S Unspecified displaced fracture of fifth cervical vertebra, sequela: Secondary | ICD-10-CM | POA: Diagnosis not present

## 2015-12-17 DIAGNOSIS — E785 Hyperlipidemia, unspecified: Secondary | ICD-10-CM | POA: Diagnosis not present

## 2015-12-17 DIAGNOSIS — Z66 Do not resuscitate: Secondary | ICD-10-CM | POA: Diagnosis present

## 2015-12-17 DIAGNOSIS — Y92002 Bathroom of unspecified non-institutional (private) residence single-family (private) house as the place of occurrence of the external cause: Secondary | ICD-10-CM | POA: Diagnosis not present

## 2015-12-17 DIAGNOSIS — H409 Unspecified glaucoma: Secondary | ICD-10-CM | POA: Diagnosis present

## 2015-12-17 DIAGNOSIS — G9340 Encephalopathy, unspecified: Secondary | ICD-10-CM | POA: Diagnosis present

## 2015-12-17 DIAGNOSIS — I739 Peripheral vascular disease, unspecified: Secondary | ICD-10-CM | POA: Diagnosis present

## 2015-12-17 DIAGNOSIS — S12400A Unspecified displaced fracture of fifth cervical vertebra, initial encounter for closed fracture: Secondary | ICD-10-CM

## 2015-12-17 DIAGNOSIS — G8929 Other chronic pain: Secondary | ICD-10-CM | POA: Diagnosis not present

## 2015-12-17 DIAGNOSIS — E7800 Pure hypercholesterolemia, unspecified: Secondary | ICD-10-CM | POA: Diagnosis present

## 2015-12-17 DIAGNOSIS — S12401S Unspecified nondisplaced fracture of fifth cervical vertebra, sequela: Secondary | ICD-10-CM | POA: Diagnosis not present

## 2015-12-17 DIAGNOSIS — Z7901 Long term (current) use of anticoagulants: Secondary | ICD-10-CM

## 2015-12-17 DIAGNOSIS — M542 Cervicalgia: Secondary | ICD-10-CM | POA: Diagnosis not present

## 2015-12-17 DIAGNOSIS — Z888 Allergy status to other drugs, medicaments and biological substances status: Secondary | ICD-10-CM | POA: Diagnosis not present

## 2015-12-17 DIAGNOSIS — S12490A Other displaced fracture of fifth cervical vertebra, initial encounter for closed fracture: Secondary | ICD-10-CM | POA: Diagnosis not present

## 2015-12-17 DIAGNOSIS — R5381 Other malaise: Secondary | ICD-10-CM | POA: Diagnosis not present

## 2015-12-17 DIAGNOSIS — R269 Unspecified abnormalities of gait and mobility: Secondary | ICD-10-CM | POA: Diagnosis present

## 2015-12-17 DIAGNOSIS — E039 Hypothyroidism, unspecified: Secondary | ICD-10-CM | POA: Diagnosis present

## 2015-12-17 DIAGNOSIS — F329 Major depressive disorder, single episode, unspecified: Secondary | ICD-10-CM | POA: Diagnosis not present

## 2015-12-17 DIAGNOSIS — Z90710 Acquired absence of both cervix and uterus: Secondary | ICD-10-CM | POA: Diagnosis not present

## 2015-12-17 DIAGNOSIS — S199XXA Unspecified injury of neck, initial encounter: Secondary | ICD-10-CM | POA: Diagnosis not present

## 2015-12-17 DIAGNOSIS — R2689 Other abnormalities of gait and mobility: Secondary | ICD-10-CM

## 2015-12-17 DIAGNOSIS — W19XXXD Unspecified fall, subsequent encounter: Secondary | ICD-10-CM | POA: Diagnosis not present

## 2015-12-17 DIAGNOSIS — R296 Repeated falls: Secondary | ICD-10-CM | POA: Diagnosis not present

## 2015-12-17 DIAGNOSIS — D6859 Other primary thrombophilia: Secondary | ICD-10-CM | POA: Diagnosis not present

## 2015-12-17 DIAGNOSIS — Z9842 Cataract extraction status, left eye: Secondary | ICD-10-CM

## 2015-12-17 DIAGNOSIS — G4700 Insomnia, unspecified: Secondary | ICD-10-CM | POA: Diagnosis present

## 2015-12-17 DIAGNOSIS — M25512 Pain in left shoulder: Secondary | ICD-10-CM | POA: Diagnosis not present

## 2015-12-17 DIAGNOSIS — Z86711 Personal history of pulmonary embolism: Secondary | ICD-10-CM

## 2015-12-17 DIAGNOSIS — E079 Disorder of thyroid, unspecified: Secondary | ICD-10-CM | POA: Diagnosis present

## 2015-12-17 DIAGNOSIS — Z9841 Cataract extraction status, right eye: Secondary | ICD-10-CM | POA: Diagnosis not present

## 2015-12-17 DIAGNOSIS — Z96659 Presence of unspecified artificial knee joint: Secondary | ICD-10-CM

## 2015-12-17 DIAGNOSIS — S12400D Unspecified displaced fracture of fifth cervical vertebra, subsequent encounter for fracture with routine healing: Secondary | ICD-10-CM | POA: Diagnosis present

## 2015-12-17 DIAGNOSIS — Z881 Allergy status to other antibiotic agents status: Secondary | ICD-10-CM | POA: Diagnosis not present

## 2015-12-17 DIAGNOSIS — K5900 Constipation, unspecified: Secondary | ICD-10-CM | POA: Diagnosis present

## 2015-12-17 DIAGNOSIS — S299XXA Unspecified injury of thorax, initial encounter: Secondary | ICD-10-CM | POA: Diagnosis not present

## 2015-12-17 DIAGNOSIS — S3993XA Unspecified injury of pelvis, initial encounter: Secondary | ICD-10-CM | POA: Diagnosis not present

## 2015-12-17 DIAGNOSIS — S129XXA Fracture of neck, unspecified, initial encounter: Secondary | ICD-10-CM | POA: Diagnosis not present

## 2015-12-17 DIAGNOSIS — E038 Other specified hypothyroidism: Secondary | ICD-10-CM | POA: Diagnosis not present

## 2015-12-17 DIAGNOSIS — S4992XA Unspecified injury of left shoulder and upper arm, initial encounter: Secondary | ICD-10-CM | POA: Diagnosis not present

## 2015-12-17 DIAGNOSIS — Z7982 Long term (current) use of aspirin: Secondary | ICD-10-CM | POA: Diagnosis not present

## 2015-12-17 DIAGNOSIS — Z79899 Other long term (current) drug therapy: Secondary | ICD-10-CM

## 2015-12-17 DIAGNOSIS — G44309 Post-traumatic headache, unspecified, not intractable: Secondary | ICD-10-CM | POA: Diagnosis not present

## 2015-12-17 DIAGNOSIS — Z96653 Presence of artificial knee joint, bilateral: Secondary | ICD-10-CM | POA: Diagnosis present

## 2015-12-17 DIAGNOSIS — S12490S Other displaced fracture of fifth cervical vertebra, sequela: Secondary | ICD-10-CM | POA: Diagnosis not present

## 2015-12-17 DIAGNOSIS — G20 Parkinson's disease: Secondary | ICD-10-CM | POA: Diagnosis present

## 2015-12-17 DIAGNOSIS — R52 Pain, unspecified: Secondary | ICD-10-CM | POA: Diagnosis present

## 2015-12-17 DIAGNOSIS — S0990XA Unspecified injury of head, initial encounter: Secondary | ICD-10-CM | POA: Diagnosis not present

## 2015-12-17 DIAGNOSIS — W01198A Fall on same level from slipping, tripping and stumbling with subsequent striking against other object, initial encounter: Secondary | ICD-10-CM | POA: Diagnosis present

## 2015-12-17 DIAGNOSIS — I10 Essential (primary) hypertension: Secondary | ICD-10-CM | POA: Diagnosis present

## 2015-12-17 DIAGNOSIS — M797 Fibromyalgia: Secondary | ICD-10-CM | POA: Diagnosis present

## 2015-12-17 DIAGNOSIS — E876 Hypokalemia: Secondary | ICD-10-CM | POA: Diagnosis not present

## 2015-12-17 DIAGNOSIS — S12491S Other nondisplaced fracture of fifth cervical vertebra, sequela: Secondary | ICD-10-CM | POA: Diagnosis not present

## 2015-12-17 DIAGNOSIS — T148XXA Other injury of unspecified body region, initial encounter: Secondary | ICD-10-CM | POA: Diagnosis not present

## 2015-12-17 DIAGNOSIS — W19XXXA Unspecified fall, initial encounter: Secondary | ICD-10-CM

## 2015-12-17 HISTORY — DX: Unspecified abnormalities of gait and mobility: R26.9

## 2015-12-17 HISTORY — DX: Unspecified displaced fracture of fifth cervical vertebra, initial encounter for closed fracture: S12.400A

## 2015-12-17 LAB — URINE MICROSCOPIC-ADD ON: RBC / HPF: NONE SEEN RBC/hpf (ref 0–5)

## 2015-12-17 LAB — CBC WITH DIFFERENTIAL/PLATELET
BASOS PCT: 0 %
Basophils Absolute: 0 10*3/uL (ref 0.0–0.1)
EOS ABS: 0 10*3/uL (ref 0.0–0.7)
EOS PCT: 0 %
HCT: 42.4 % (ref 36.0–46.0)
Hemoglobin: 14 g/dL (ref 12.0–15.0)
LYMPHS ABS: 1 10*3/uL (ref 0.7–4.0)
Lymphocytes Relative: 6 %
MCH: 29.1 pg (ref 26.0–34.0)
MCHC: 33 g/dL (ref 30.0–36.0)
MCV: 88.1 fL (ref 78.0–100.0)
MONO ABS: 1 10*3/uL (ref 0.1–1.0)
MONOS PCT: 6 %
Neutro Abs: 15.4 10*3/uL — ABNORMAL HIGH (ref 1.7–7.7)
Neutrophils Relative %: 88 %
PLATELETS: 258 10*3/uL (ref 150–400)
RBC: 4.81 MIL/uL (ref 3.87–5.11)
RDW: 13.2 % (ref 11.5–15.5)
WBC: 17.4 10*3/uL — ABNORMAL HIGH (ref 4.0–10.5)

## 2015-12-17 LAB — URINALYSIS, ROUTINE W REFLEX MICROSCOPIC
Bilirubin Urine: NEGATIVE
GLUCOSE, UA: NEGATIVE mg/dL
HGB URINE DIPSTICK: NEGATIVE
KETONES UR: NEGATIVE mg/dL
Nitrite: NEGATIVE
PH: 7.5 (ref 5.0–8.0)
Protein, ur: NEGATIVE mg/dL
Specific Gravity, Urine: 1.017 (ref 1.005–1.030)

## 2015-12-17 LAB — BASIC METABOLIC PANEL
Anion gap: 8 (ref 5–15)
BUN: 11 mg/dL (ref 6–20)
CALCIUM: 9.2 mg/dL (ref 8.9–10.3)
CHLORIDE: 100 mmol/L — AB (ref 101–111)
CO2: 28 mmol/L (ref 22–32)
CREATININE: 0.67 mg/dL (ref 0.44–1.00)
Glucose, Bld: 181 mg/dL — ABNORMAL HIGH (ref 65–99)
Potassium: 3.5 mmol/L (ref 3.5–5.1)
SODIUM: 136 mmol/L (ref 135–145)

## 2015-12-17 LAB — PROTIME-INR
INR: 2.66
PROTHROMBIN TIME: 28.9 s — AB (ref 11.4–15.2)

## 2015-12-17 MED ORDER — HYDROCHLOROTHIAZIDE 12.5 MG PO CAPS
12.5000 mg | ORAL_CAPSULE | Freq: Every day | ORAL | Status: DC
  Administered 2015-12-18: 12.5 mg via ORAL
  Filled 2015-12-17: qty 1

## 2015-12-17 MED ORDER — HYDROMORPHONE HCL 1 MG/ML IJ SOLN
0.5000 mg | Freq: Once | INTRAMUSCULAR | Status: AC
  Administered 2015-12-17: 0.5 mg via INTRAMUSCULAR
  Filled 2015-12-17: qty 1

## 2015-12-17 MED ORDER — SODIUM CHLORIDE 0.9 % IV SOLN
INTRAVENOUS | Status: DC
  Administered 2015-12-17: 19:00:00 via INTRAVENOUS

## 2015-12-17 MED ORDER — VALSARTAN 160 MG PO TABS
160.0000 mg | ORAL_TABLET | Freq: Every day | ORAL | Status: DC
  Filled 2015-12-17: qty 1

## 2015-12-17 MED ORDER — DULOXETINE HCL 60 MG PO CPEP
60.0000 mg | ORAL_CAPSULE | Freq: Every day | ORAL | Status: DC
  Administered 2015-12-17 – 2015-12-20 (×4): 60 mg via ORAL
  Filled 2015-12-17 (×4): qty 1

## 2015-12-17 MED ORDER — WARFARIN - PHARMACIST DOSING INPATIENT
Freq: Every day | Status: DC
  Administered 2015-12-18 – 2015-12-19 (×2)

## 2015-12-17 MED ORDER — TELMISARTAN-HCTZ 40-12.5 MG PO TABS: 1.0000 | ORAL_TABLET | Freq: Every day | ORAL | Status: DC

## 2015-12-17 MED ORDER — MORPHINE SULFATE (PF) 2 MG/ML IV SOLN
1.0000 mg | INTRAVENOUS | Status: DC | PRN
  Administered 2015-12-17: 4 mg via INTRAVENOUS
  Administered 2015-12-17: 2 mg via INTRAVENOUS
  Administered 2015-12-17 – 2015-12-18 (×2): 4 mg via INTRAVENOUS
  Filled 2015-12-17 (×2): qty 2
  Filled 2015-12-17: qty 1
  Filled 2015-12-17: qty 2

## 2015-12-17 MED ORDER — OXYCODONE-ACETAMINOPHEN 5-325 MG PO TABS
1.0000 | ORAL_TABLET | Freq: Once | ORAL | Status: AC
  Administered 2015-12-17: 1 via ORAL
  Filled 2015-12-17: qty 1

## 2015-12-17 MED ORDER — MAGNESIUM 250 MG PO TABS: 1.0000 | ORAL_TABLET | Freq: Every day | ORAL | Status: DC

## 2015-12-17 MED ORDER — ADULT MULTIVITAMIN W/MINERALS CH
1.0000 | ORAL_TABLET | Freq: Every day | ORAL | Status: DC
  Administered 2015-12-18 – 2015-12-20 (×3): 1 via ORAL
  Filled 2015-12-17 (×3): qty 1

## 2015-12-17 MED ORDER — OXYCODONE HCL 5 MG PO TABS
5.0000 mg | ORAL_TABLET | ORAL | Status: DC | PRN
  Administered 2015-12-17 – 2015-12-18 (×6): 10 mg via ORAL
  Administered 2015-12-19: 5 mg via ORAL
  Administered 2015-12-19: 10 mg via ORAL
  Administered 2015-12-19 – 2015-12-20 (×2): 5 mg via ORAL
  Administered 2015-12-20 (×2): 10 mg via ORAL
  Filled 2015-12-17: qty 2
  Filled 2015-12-17: qty 1
  Filled 2015-12-17 (×7): qty 2
  Filled 2015-12-17: qty 1
  Filled 2015-12-17 (×2): qty 2

## 2015-12-17 MED ORDER — ACETAMINOPHEN 325 MG PO TABS: 650.0000 mg | ORAL_TABLET | Freq: Four times a day (QID) | ORAL | Status: DC | PRN

## 2015-12-17 MED ORDER — ACETAMINOPHEN 650 MG RE SUPP: 650.0000 mg | Freq: Four times a day (QID) | RECTAL | Status: DC | PRN

## 2015-12-17 MED ORDER — HYDROMORPHONE HCL 1 MG/ML IJ SOLN
INTRAMUSCULAR | Status: AC
  Filled 2015-12-17: qty 1

## 2015-12-17 MED ORDER — CYCLOSPORINE 0.05 % OP EMUL
1.0000 [drp] | Freq: Two times a day (BID) | OPHTHALMIC | Status: DC
  Administered 2015-12-17 – 2015-12-20 (×6): 1 [drp] via OPHTHALMIC
  Filled 2015-12-17 (×7): qty 1

## 2015-12-17 MED ORDER — WARFARIN SODIUM 4 MG PO TABS
4.0000 mg | ORAL_TABLET | Freq: Once | ORAL | Status: AC
  Administered 2015-12-17: 4 mg via ORAL
  Filled 2015-12-17: qty 1

## 2015-12-17 MED ORDER — CARBIDOPA-LEVODOPA ER 25-100 MG PO TBCR
1.0000 | EXTENDED_RELEASE_TABLET | Freq: Three times a day (TID) | ORAL | Status: DC
  Administered 2015-12-17 – 2015-12-20 (×10): 1 via ORAL
  Filled 2015-12-17 (×12): qty 1

## 2015-12-17 MED ORDER — ATORVASTATIN CALCIUM 10 MG PO TABS
10.0000 mg | ORAL_TABLET | Freq: Every day | ORAL | Status: DC
  Administered 2015-12-17 – 2015-12-20 (×4): 10 mg via ORAL
  Filled 2015-12-17 (×4): qty 1

## 2015-12-17 MED ORDER — ASPIRIN EC 81 MG PO TBEC
81.0000 mg | DELAYED_RELEASE_TABLET | Freq: Every day | ORAL | Status: DC
Start: 2015-12-18 — End: 2015-12-20
  Administered 2015-12-18 – 2015-12-20 (×3): 81 mg via ORAL
  Filled 2015-12-17 (×4): qty 1

## 2015-12-17 MED ORDER — MAGNESIUM OXIDE 400 (241.3 MG) MG PO TABS
200.0000 mg | ORAL_TABLET | Freq: Every day | ORAL | Status: DC
  Administered 2015-12-18 – 2015-12-20 (×3): 200 mg via ORAL
  Filled 2015-12-17 (×3): qty 1

## 2015-12-17 MED ORDER — CARBIDOPA-LEVODOPA ER 23.75-95 MG PO CPCR: 1.0000 | ORAL_CAPSULE | Freq: Three times a day (TID) | ORAL | Status: DC

## 2015-12-17 MED ORDER — OMEGA-3-ACID ETHYL ESTERS 1 G PO CAPS
1.0000 g | ORAL_CAPSULE | Freq: Every day | ORAL | Status: DC
  Administered 2015-12-18 – 2015-12-20 (×3): 1 g via ORAL
  Filled 2015-12-17 (×3): qty 1

## 2015-12-17 MED ORDER — LEVOTHYROXINE SODIUM 75 MCG PO TABS
150.0000 ug | ORAL_TABLET | Freq: Every day | ORAL | Status: DC
  Administered 2015-12-18 – 2015-12-20 (×3): 150 ug via ORAL
  Filled 2015-12-17 (×3): qty 2

## 2015-12-17 MED ORDER — AZELASTINE HCL 0.1 % NA SOLN
1.0000 | Freq: Two times a day (BID) | NASAL | Status: DC
Start: 2015-12-17 — End: 2015-12-20
  Administered 2015-12-17 – 2015-12-20 (×5): 1 via NASAL
  Filled 2015-12-17: qty 30

## 2015-12-17 MED ORDER — HYDROMORPHONE HCL 1 MG/ML IJ SOLN
1.0000 mg | Freq: Once | INTRAMUSCULAR | Status: AC
  Administered 2015-12-17: 1 mg via SUBCUTANEOUS

## 2015-12-17 MED ORDER — HYDROMORPHONE HCL 1 MG/ML IJ SOLN: 1.0000 mg | Freq: Once | INTRAMUSCULAR | Status: DC | PRN

## 2015-12-17 MED ORDER — ZOLPIDEM TARTRATE 5 MG PO TABS
5.0000 mg | ORAL_TABLET | Freq: Every day | ORAL | Status: DC
  Administered 2015-12-17 – 2015-12-19 (×3): 5 mg via ORAL
  Filled 2015-12-17 (×3): qty 1

## 2015-12-17 NOTE — Progress Notes (Signed)
Pt admitted to 5M12 at this time.  Alert and oriented.  Pt complaining of severe pain.  Medicated.  [place] collar on with correct placement.  Son at bedside.  Pt given bath and linens changed.

## 2015-12-17 NOTE — ED Notes (Signed)
Pt repeatedly taking off neck collar, c/o discomfort and raising neck by self with no support. Pt educated on risks of removing collar and moving neck. Pt non-compliant with staff's instructions

## 2015-12-17 NOTE — ED Notes (Signed)
[place] collar applied per Dr. Laneta Simmers

## 2015-12-17 NOTE — Progress Notes (Signed)
ANTICOAGULATION CONSULT NOTE - Initial Consult  Pharmacy Consult for warfarin Indication: VTE  Allergies  Allergen Reactions  . Crestor [Rosuvastatin Calcium] Other (See Comments)    Feeling very bad, aching all over.  . Erythromycin Hives  . Pregabalin Other (See Comments)    Crossed vision.  Marland Kitchen Macrobid [Nitrofurantoin] Hives  . Carbidopa     Headaches, nausea, dizziness  . Gabapentin Other (See Comments)    Blurred vision  . Losartan     Patient Measurements: Height: 5\' 6"  (167.6 cm) Weight: 195 lb (88.5 kg) IBW/kg (Calculated) : 59.3   Vital Signs: Temp: 97.5 F (36.4 C) (10/13 0621) Temp Source: Oral (10/13 0621) BP: 131/60 (10/13 1215) Pulse Rate: 85 (10/13 1215)  Labs:  Recent Labs  12/15/15 1127 12/17/15 0738  HGB  --  14.0  HCT  --  42.4  PLT  --  258  LABPROT  --  28.9*  INR 3.0 2.66  CREATININE  --  0.67    Estimated Creatinine Clearance: 71.2 mL/min (by C-G formula based on SCr of 0.67 mg/dL).   Medical History: Past Medical History:  Diagnosis Date  . Arthritis   . Chronic insomnia   . Complication of anesthesia    severe itching night of surgery requiring meds- also couldnt swallow- throat "paralyzed""  . Fibromyalgia   . Glaucoma   . Hypercholesterolemia   . Hyperlipidemia   . Hypertension    PCP Dr Cari Caraway- [place]  05/15/11 with clearance and note on chart,    chest x ray, EKG 12/12 EPIC, eccho 7/11 EPIC  . Hypothyroidism    s/p graves disease  . Kidney stone   . PD (Parkinson's disease) ([place])   . Peripheral vascular disease ([place])    PULMONARY EMBOLUS x 2- 2011, 2012/ FOLLOWED BY DR ODOGWU-LOV 12/12 EPIC   PT STAES WILL STOP COUMADIN 3/12 and begin LOVONOX 05/17/11 as previously instructed  . Sinus infection    at present- dr Honor Loh PA aware- was seen there and at PCP 05/10/11  . Thyroid disease    Hypothyroidism    Medications:   (Not in a hospital admission)  Assessment: Pt admitted s/p fall. No imaging evidence of  bleeding. No bleeding reported. Pt takes PTA warfarin for hypercoaguable state. Dose is 4mg  daily. INR 10/11 in clinic was 3.0, today INR is 2.66. H/H and Plt WNL.   Goal of Therapy:  INR 2-3 Monitor platelets by anticoagulation protocol: Yes   Plan:  Warfarin 4 mg PO x1 Daily INR Monitor s/sx bleeding  Cheral Almas, PharmD Candidate 12/17/2015,1:30 PM

## 2015-12-17 NOTE — ED Triage Notes (Signed)
Pt arrives to [facility] at this time via [facility] stretcher. Pt reports left shoulder and neck pain post fall.  Pt rates her pain level at a 10/10.  C-collar in place at this time placed by [facility].   Chief Complaint  Patient presents with  . Fall  . Neck Injury  . Shoulder Pain   Past Medical History:  Diagnosis Date  . Arthritis   . Chronic insomnia   . Complication of anesthesia    severe itching night of surgery requiring meds- also couldnt swallow- throat "paralyzed""  . Fibromyalgia   . Glaucoma   . Hypercholesterolemia   . Hyperlipidemia   . Hypertension    PCP Dr Cari Caraway- [place]  05/15/11 with clearance and note on chart,    chest x ray, EKG 12/12 EPIC, eccho 7/11 EPIC  . Hypothyroidism    s/p graves disease  . Kidney stone   . PD (Parkinson's disease) ([place])   . Peripheral vascular disease ([place])    PULMONARY EMBOLUS x 2- 2011, 2012/ FOLLOWED BY DR ODOGWU-LOV 12/12 EPIC   PT STAES WILL STOP COUMADIN 3/12 and begin LOVONOX 05/17/11 as previously instructed  . Sinus infection    at present- dr Honor Loh PA aware- was seen there and at PCP 05/10/11  . Thyroid disease    Hypothyroidism

## 2015-12-17 NOTE — ED Notes (Signed)
Placed pt in [place] collar. Used pediatric neck piece with adult backing

## 2015-12-17 NOTE — ED Provider Notes (Signed)
[facility] DEPT Provider Note   CSN: [id number] Arrival date & time: 12/17/15  [id number]     History   Chief Complaint Chief Complaint  Patient presents with  . Fall  . Neck Injury  . Shoulder Pain    HPI Shiva Eklof is a 70 y.o. female.  The history is provided by the patient.  Fall  This is a new problem. The current episode started 3 to 5 hours ago. The problem occurs constantly. The problem has not changed since onset.Associated symptoms comments: Neck pain, left greater than right shoulder pain. Exacerbated by: movement. Nothing relieves the symptoms. She has tried nothing for the symptoms.    Past Medical History:  Diagnosis Date  . Arthritis   . Chronic insomnia   . Complication of anesthesia    severe itching night of surgery requiring meds- also couldnt swallow- throat "paralyzed""  . Fibromyalgia   . Glaucoma   . Hypercholesterolemia   . Hyperlipidemia   . Hypertension    PCP Dr Cari Caraway- [place]  05/15/11 with clearance and note on chart,    chest x ray, EKG 12/12 EP[facility], eccho 7/11 EP[facility]  . Hypothyroidism    s/p graves disease  . Kidney stone   . PD (Parkinson's disease) ([place])   . Peripheral vascular disease ([place])    PULMONARY EMBOLUS x 2- 2011, 2012/ FOLLOWED BY DR [id number]-LOV 12/12 EPIC   PT [facility]AES WILL STOP COUMADIN 3/12 and begin LOVONOX 05/17/11 as previously instructed  . Sinus infection    at present- dr Honor Loh PA aware- was seen there and at PCP 05/10/11  . Thyroid disease    Hypothyroidism    Patient Active Problem List   Diagnosis Date Noted  . Trochanteric bursitis of both hips 09/13/2015  . Chronic low back pain 06/07/2015  . Fibromyalgia syndrome 05/17/2015  . Hypothyroidism 05/17/2015  . Parkinson's disease ([place]) 05/17/2015  . Hypercoagulable state ([place]) 02/07/2012  . Embolism ([place]) 02/07/2012  . S/P right TKA 05/22/2011    Past Surgical History:  Procedure Laterality Date  . ABDOMINAL HYSTERECTOMY    . APPENDECTOMY    . CATARACT  EXTRACTION Bilateral   . CHOLECYSTECTOMY    . COLONOSCOPY    . EXPLORATORY LAPAROTOMY    . JOINT REPLACEMENT     left knee  7/12  . TOTAL KNEE ARTHROPLASTY  05/22/2011   Procedure: TOTAL KNEE ARTHROPLASTY;  Surgeon: Mauri Pole, MD;  Location: [facility] ORS;  Service: Orthopedics;  Laterality: Right;    OB History    No data available       Home Medications    Prior to Admission medications   Medication Sig Start Date End Date Taking? Authorizing Provider  warfarin (COUMADIN) 4 MG tablet Take 4 mg by mouth daily.   Yes Historical Provider, MD  acetaminophen (TYLENOL) 325 MG tablet Take 650 mg by mouth every 6 (six) hours as needed.    Historical Provider, MD  aspirin 81 MG tablet Take 81 mg by mouth daily after breakfast.     Historical Provider, MD  azelastine (ASTELIN) 0.1 % nasal spray Place 1 spray into both nostrils 2 (two) times daily. Use in each nostril as directed    Historical Provider, MD  B Complex-C (SUPER B COMPLEX PO) Take 1 tablet by mouth at bedtime.     Historical Provider, MD  Calcium Carbonate-Vitamin D (CALCIUM + D PO) Take 2 tablets by mouth at bedtime. 600-200 mg tab.    Historical Provider, MD  Carbidopa-Levodopa  ER (RYTARY) 23.75-95 MG CPCR Take 1 tablet by mouth 3 (three) times daily. 10/21/15   Eustace Quail Tat, DO  Cholecalciferol (VITAMIN D3) 5000 UNITS CAPS Take 5,000 Units by mouth daily.     Historical Provider, MD  DULoxetine (CYMBALTA) 60 MG capsule Take 1 capsule (60 mg total) by mouth daily. 07/12/15   Charlett Blake, MD  DULoxetine (CYMBALTA) 60 MG capsule Take 1 capsule (60 mg total) by mouth daily. 11/15/15   Charlett Blake, MD  fluocinonide (LIDEX) 0.05 % external solution Apply to scalp as needed 08/24/15   Historical Provider, MD  hydrocortisone valerate cream (WESTCORT) 0.2 % Apply topically as needed 09/01/15   Historical Provider, MD  Magnesium 250 MG TABS Take 1 tablet by mouth at bedtime.     Historical Provider, MD  Multiple Vitamin  (MULTIVITAMIN) tablet Take 1 tablet by mouth daily.     Historical Provider, MD  Omega-3 Fatty Acids (FISH OIL) 1200 MG CAPS Take 2 capsules by mouth at bedtime.     Historical Provider, MD  SYNTHROID 150 MCG tablet Take 150 mcg by mouth daily before breakfast.  07/13/14   Historical Provider, MD  telmisartan-hydrochlorothiazide (MICARDIS HCT) 40-12.5 MG per tablet Take 1 tablet by mouth daily.  07/14/14   Historical Provider, MD  zolpidem (AMBIEN) 10 MG tablet Take 1 tablet (10 mg total) by mouth at bedtime. 11/10/15   Darreld Mclean, MD    Family History Family History  Problem Relation Age of Onset  . Prostate cancer Father   . Stroke Father     Social History Social History  Substance Use Topics  . Smoking status: Former Smoker    Packs/day: 1.00    Quit date: 05/10/1989  . Smokeless tobacco: Never Used  . Alcohol use 0.0 oz/week     Comment: one drink once a month     Allergies   Crestor [rosuvastatin calcium]; Erythromycin; Pregabalin; Macrobid [nitrofurantoin]; Carbidopa; Gabapentin; and Losartan   Review of Systems Review of Systems  All other systems reviewed and are negative.    Physical Exam Updated Vital Signs BP 121/63 (BP Location: Right Arm)   Pulse 79   Temp 97.5 F (36.4 C) (Oral)   Resp 18   Ht 5\' 6"  (1.676 m)   Wt 195 lb (88.5 kg)   SpO2 93%   BMI 31.47 kg/m   Physical Exam  Constitutional: She is oriented to person, place, and time. She appears well-developed and well-nourished. No distress.  HENT:  Head: Normocephalic.  Nose: Nose normal.  Eyes: Conjunctivae are normal.  Neck: Neck supple. No tracheal deviation present.  Cardiovascular: Normal rate and regular rhythm.   Pulmonary/Chest: Effort normal. No respiratory distress.  Abdominal: Soft. She exhibits no distension.  Musculoskeletal:       Left shoulder: She exhibits tenderness and pain. She exhibits normal range of motion and no deformity.       Cervical back: She exhibits tenderness  (diffuse).  Neurological: She is alert and oriented to person, place, and time. She has normal strength. No cranial nerve deficit or sensory deficit. Coordination normal.  Skin: Skin is warm and dry.  Psychiatric: She has a normal mood and affect.     ED Treatments / Results  Labs (all labs ordered are listed, but only abnormal results are displayed) Labs Reviewed  CBC WITH DIFFERENTIAL/PLATELET - Abnormal; Notable for the following:       Result Value   WBC 17.4 (*)    Neutro Abs  15.4 (*)    All other components within normal limits  BASIC METABOLIC PANEL - Abnormal; Notable for the following:    Chloride 100 (*)    Glucose, Bld 181 (*)    All other components within normal limits  PROTIME-INR - Abnormal; Notable for the following:    Prothrombin Time 28.9 (*)    All other components within normal limits  URINALYSIS, ROUTINE W REFLEX MICROSCOPIC (NOT AT [facility]) - Abnormal; Notable for the following:    Color, Urine AMBER (*)    APPearance TURBID (*)    Leukocytes, UA TRACE (*)    All other components within normal limits  URINE MICROSCOPIC-ADD ON - Abnormal; Notable for the following:    Squamous Epithelial / LPF 6-30 (*)    Bacteria, UA MANY (*)    All other components within normal limits  BASIC METABOLIC PANEL  CBC  PROTIME-INR    EKG  EKG Interpretation None       Radiology Dg Chest 1 View  Result Date: 12/17/2015 CLINICAL DATA:  Fall EXAM: CHEST 1 VIEW COMPARISON:  11/26/2010 FINDINGS: Borderline cardiomegaly. No acute infiltrate or pleural effusion. No pulmonary edema. Mild degenerative changes mid thoracic spine. IMPRESSION: No active disease. Electronically Signed   By: Lahoma Crocker M.D.   On: 12/17/2015 11:24   Dg Pelvis 1-2 Views  Result Date: 12/17/2015 CLINICAL DATA:  Fall with C5 fracture. EXAM: PELVIS - 1-2 VIEW COMPARISON:  Abdominal CT 09/07/2012 FINDINGS: Limited evaluation of the sacrum due to bowel gas. No suspected fracture. No diastasis.  Osteopenia. Lower lumbar facet arthropathy IMPRESSION: No acute finding. Electronically Signed   By: Monte Fantasia M.D.   On: 12/17/2015 11:18   Ct Head Wo Contrast  Result Date: 12/17/2015 CLINICAL DATA:  Posttraumatic headache and neck pain after fall off steps. EXAM: CT HEAD WITHOUT CONTRAST CT CERVICAL SPINE WITHOUT CONTRAST TECHNIQUE: Multidetector CT imaging of the head and cervical spine was performed following the standard protocol without intravenous contrast. Multiplanar CT image reconstructions of the cervical spine were also generated. COMPARISON:  CT scan of December 09, 2014. FINDINGS: CT HEAD FINDINGS Brain: Mild diffuse cortical atrophy is noted. Mild chronic ischemic white matter disease is noted. No mass effect or midline shift is noted. Ventricular size is within normal limits. There is no evidence of mass lesion, hemorrhage or acute infarction. Vascular: Atherosclerosis of internal carotid arteries is noted. Skull: Bony calvarium appears intact. Sinuses/Orbits: Visualized paranasal sinuses appear normal. Other: None. CT CERVICAL SPINE FINDINGS Alignment: Normal. Skull base and vertebrae: Moderately displaced fracture is seen involving the posterior spinous process of C5. No other fracture is noted. Soft tissues and spinal canal: No definite soft tissue abnormality is noted. Disc levels: Mild degenerative disc disease is noted at C6-7 with anterior and posterior osteophyte formation. Fusion of the left-sided posterior facet joints is noted at C2-3, C3-4, and C4-5. Upper chest: Visualized lung fields appear normal. Other: Atherosclerosis of thoracic aorta is noted. IMPRESSION: Mild diffuse cortical atrophy. Mild chronic ischemic white matter disease. No acute intracranial abnormality seen. Mild degenerative disc disease is noted at C6-7. Other degenerative changes are noted. Moderately displaced fracture is seen involving the posterior spinous process of C5. Critical Value/emergent results  were called by telephone at the time of interpretation on 12/17/2015 at 9:08 am to Dr. Leo Grosser , who verbally acknowledged these results. Electronically Signed   By: Marijo Conception, M.D.   On: 12/17/2015 09:11   Ct Cervical Spine Wo Contrast  Result Date: 12/17/2015 CLINICAL DATA:  Posttraumatic headache and neck pain after fall off steps. EXAM: CT HEAD WITHOUT CONTRAST CT CERVICAL SPINE WITHOUT CONTRAST TECHNIQUE: Multidetector CT imaging of the head and cervical spine was performed following the standard protocol without intravenous contrast. Multiplanar CT image reconstructions of the cervical spine were also generated. COMPARISON:  CT scan of December 09, 2014. FINDINGS: CT HEAD FINDINGS Brain: Mild diffuse cortical atrophy is noted. Mild chronic ischemic white matter disease is noted. No mass effect or midline shift is noted. Ventricular size is within normal limits. There is no evidence of mass lesion, hemorrhage or acute infarction. Vascular: Atherosclerosis of internal carotid arteries is noted. Skull: Bony calvarium appears intact. Sinuses/Orbits: Visualized paranasal sinuses appear normal. Other: None. CT CERVICAL SPINE FINDINGS Alignment: Normal. Skull base and vertebrae: Moderately displaced fracture is seen involving the posterior spinous process of C5. No other fracture is noted. Soft tissues and spinal canal: No definite soft tissue abnormality is noted. Disc levels: Mild degenerative disc disease is noted at C6-7 with anterior and posterior osteophyte formation. Fusion of the left-sided posterior facet joints is noted at C2-3, C3-4, and C4-5. Upper chest: Visualized lung fields appear normal. Other: Atherosclerosis of thoracic aorta is noted. IMPRESSION: Mild diffuse cortical atrophy. Mild chronic ischemic white matter disease. No acute intracranial abnormality seen. Mild degenerative disc disease is noted at C6-7. Other degenerative changes are noted. Moderately displaced fracture is seen  involving the posterior spinous process of C5. Critical Value/emergent results were called by telephone at the time of interpretation on 12/17/2015 at 9:08 am to Dr. Leo Grosser , who verbally acknowledged these results. Electronically Signed   By: Marijo Conception, M.D.   On: 12/17/2015 09:11   Dg Shoulder Left  Result Date: 12/17/2015 CLINICAL DATA:  Status post fall down stairs this morning with a left shoulder injury. Pain. Initial encounter. EXAM: LEFT SHOULDER - 2+ VIEW COMPARISON:  None. FINDINGS: No acute bony or joint abnormality is identified. Mild to moderate acromioclavicular osteoarthritis is noted. Imaged left lung and ribs are unremarkable. IMPRESSION: No acute finding. Electronically Signed   By: Inge Rise M.D.   On: 12/17/2015 08:43    Procedures Procedures (including critical care time)  Medications Ordered in ED Medications  oxyCODONE (Oxy IR/ROXICODONE) immediate release tablet 5-10 mg (10 mg Oral Given 12/17/15 1622)  acetaminophen (TYLENOL) tablet 650 mg (not administered)    Or  acetaminophen (TYLENOL) suppository 650 mg (not administered)  warfarin (COUMADIN) tablet 4 mg (not administered)  Warfarin - Pharmacist Dosing Inpatient (not administered)  0.9 %  sodium chloride infusion (not administered)  morphine 2 MG/ML injection 1-4 mg (4 mg Intravenous Given 12/17/15 1402)  atorvastatin (LIPITOR) tablet 10 mg (not administered)  cycloSPORINE (RESTASIS) 0.05 % ophthalmic emulsion 1 drop (not administered)  zolpidem (AMBIEN) tablet 5 mg (not administered)  levothyroxine (SYNTHROID, LEVOTHROID) tablet 150 mcg (not administered)  DULoxetine (CYMBALTA) DR capsule 60 mg (not administered)  omega-3 acid ethyl esters (LOVAZA) capsule 1 g (not administered)  aspirin EC tablet 81 mg (not administered)  multivitamin with minerals tablet 1 tablet (not administered)  azelastine (ASTELIN) 0.1 % nasal spray 1 spray (not administered)  magnesium oxide (MAG-OX) tablet 200 mg  (not administered)  Carbidopa-Levodopa ER (SINEMET CR) 25-100 MG tablet controlled release 1 tablet (not administered)  telmisartan-hydrochlorothiazide (MICARDIS HCT) 40-12.5 MG per tablet 1 tablet (not administered)  HYDROmorphone (DILAUDID) injection 0.5 mg (0.5 mg Intramuscular Given 12/17/15 0750)  oxyCODONE-acetaminophen (PERCOCET/ROXICET) 5-325 MG per tablet 1  tablet (1 tablet Oral Given 12/17/15 0945)  HYDROmorphone (DILAUDID) injection 1 mg (1 mg Subcutaneous Given 12/17/15 1102)     Initial Impression / Assessment and Plan / ED Course  I have reviewed the triage vital signs and the nursing notes.  Pertinent labs & imaging results that were available during my care of the patient were reviewed by me and considered in my medical decision making (see chart for details).  Clinical Course    70 y.o. female presents with fall from standing overnight where she was trying to go to the bathroom in the night and fell backward into a wall and through the sheetrock. She experienced immediate pain in her neck and shoulders L>R. Anticoagulated on coumadin. CT head and neck show no hemorrhage, no signs of injury over her body but neck CT demonstrates C5 spinous process fracture likely 2/2 flexion injury. This is a stable fracture, Pt has difficulty with cervical collar d/t small neck and finally could be appropriately fitted with pediatric aspen front and adult back. Plan was for discharge and outpatient NSU f/u of injuries for conservative management but Pt had great difficulty with pain control and was unable to ambulate, Pt's family did not feel comfortable taking her home with her immobility and severe pain complaints. Hospitalist was consulted for admission and will see the patient in the emergency department.   Final Clinical Impressions(s) / ED Diagnoses   Final diagnoses:  Closed fracture of spinous process of cervical vertebra, initial encounter [facility])    New Prescriptions New  Prescriptions   No medications on file     Leo Grosser, MD 12/17/15 1728

## 2015-12-17 NOTE — H&P (Signed)
History and Physical    [name] [name] [id number] DOB: [birth year] DOA: 12/17/2015   PCP: Lamar Blinks, MD   Patient coming from/Resides with: Private residence/lives alone  Admission status: Inpatient/medical floor -medically necessary to stay a minimum 2 midnights to rule out impending and/or unexpected changes in physiologic status that may differ from initial evaluation performed in the ER and/or at time of admission. Patient presents with displaced C5 fracture with associated intractable pain and inability to mobilize secondary to pain. She will require frequent oral pain medications as well as the potential for IV pain medications to supplement pain not controlled by oral pain medications; she will also be given IV fluids for administration of pain medications. She will need PT/OT evaluation since she lives alone and may not be able to immediately return to the home environment and may require rehabilitative therapies either at a skilled nursing facility or with CIR.  Chief Complaint: Fall with neck pain  HPI: [name] [name] is a 70 y.o. female with medical history significant for hypercoagulable disorder and history of PE on chronic Coumadin anticoagulation, hypothyroidism, Parkinson's disease with associated gait disturbance, hypothyroidism, fibromyalgia, and history of prior right knee surgery further influencing gait disturbance. Patient reports she's had 7 falls in the past 2 years. She reports that prior to the fall she had gotten up to do some work from home. When she was completed with the work she attempted to mobilize upstairs and got harder the way up the stairs when she fell backward. Since that time she's had significant neck and shoulder pain. Evaluation in the ER did reveal a moderately displaced fracture involving the posterior spinous process of the C5. EDP did discuss with the neurosurgeon on call who recommended cervical collar placement and pain management. Patient had no  appreciable neurological deficits upon evaluation. Due to pain patient was unable to mobilize while in the ER and request for admission was sent.  ED Course:  Vital Signs: BP 131/60   Pulse 85   Temp 97.5 F (36.4 C) (Oral)   Resp 20   Ht 5\' 6"  (1.676 m)   Wt 88.5 kg (195 lb)   SpO2 94%   BMI 31.47 kg/m  CT head and cervical spine without contrast: Mild diffuse cortical atrophy without acute intracranial abnormality seen, mild degenerative disc disease noted at C6-C7 with a moderately displaced fracture involving the posterior spinous process at C5 DG left shoulder: No acute findings CXR: No active disease DG pelvis 1-2 views: No acute findings Lab data: Sodium 136, potassium 3.5, chloride 100, CO2 28, BUN 11, creatinine 0.67, glucose 181, white count 17,400 with neutrophils 88% and absolute neutrophils 15.4%, hemoglobin 14, platelets 258,000, PT 28.9, INR 2.66, urinalysis abnormal with turbid appearance, many bacteria, trace leukocytes, negative nitrite, 6-30 WBCs but also 6-30 squamous epithelials concerning for contaminated specimen Medications and treatments: Dilaudid 0.5 mg IM 1, Percocet 5-3 251, Dilaudid 1 mg subcutaneous 1, morphine 4 mg IV 1   Review of Systems:  In addition to the HPI above,  No Fever-chills, myalgias or other constitutional symptoms No Headache, changes with Vision or hearing, new weakness, tingling, numbness in any extremity, dizziness, dysarthria or word finding difficulty, gait disturbance or imbalance, tremors or seizure activity No problems swallowing food or Liquids, indigestion/reflux, choking or coughing while eating, abdominal pain with or after eating No Chest pain, Cough or Shortness of Breath, palpitations, orthopnea or DOE No Abdominal pain, N/V, melena,hematochezia, dark tarry stools, constipation No dysuria, malodorous urine, hematuria or flank pain  No new skin rashes, lesions, masses or bruises, No swelling or redness No recent  unintentional weight gain or loss No polyuria, polydypsia or polyphagia   Past Medical History:  Diagnosis Date  . Arthritis   . Chronic insomnia   . Complication of anesthesia    severe itching night of surgery requiring meds- also couldnt swallow- throat "paralyzed""  . Fibromyalgia   . Glaucoma   . Hypercholesterolemia   . Hyperlipidemia   . Hypertension    PCP Dr Cari Caraway- [place]  05/15/11 with clearance and note on chart,    chest x ray, EKG 12/12 [facility], eccho 7/11 [facility]  . Hypothyroidism    s/p graves disease  . Kidney stone   . PD (Parkinson's disease) ([place])   . Peripheral vascular disease ([place])    PULMONARY EMBOLUS x 2- 2011, 2012/ FOLLOWED BY DR ODOGWU-LOV 12/12 [facility]   PT [facility] WILL STOP COUMADIN 3/12 and begin LOVONOX 05/17/11 as previously instructed  . Sinus infection    at present- dr Honor Loh PA aware- was seen there and at PCP 05/10/11  . Thyroid disease    Hypothyroidism    Past Surgical History:  Procedure Laterality Date  . ABDOMINAL HYSTERECTOMY    . APPENDECTOMY    . CATARACT EXTRACTION Bilateral   . CHOLECYSTECTOMY    . COLONOSCOPY    . EXPLORATORY LAPAROTOMY    . JOINT REPLACEMENT     left knee  7/12  . TOTAL KNEE ARTHROPLASTY  05/22/2011   Procedure: TOTAL KNEE ARTHROPLASTY;  Surgeon: Mauri Pole, MD;  Location: [facility];  Service: Orthopedics;  Laterality: Right;    Social History   Social History  . Marital status: Widowed    Spouse name: N/A  . Number of children: 2  . Years of education: N/A   Occupational History  . Not on file.   Social History Main Topics  . Smoking status: Former Smoker    Packs/day: 1.00    Quit date: 05/10/1989  . Smokeless tobacco: Never Used  . Alcohol use 0.0 oz/week     Comment: one drink once a month  . Drug use: No  . Sexual activity: Not on file   Other Topics Concern  . Not on file   Social History Narrative   Lives at home alone   Drinks caffeine occasionally     Mobility: Rolling walker and  cane Work history: Works from home   Allergies  Allergen Reactions  . Crestor [Rosuvastatin Calcium] Other (See Comments)    Feeling very bad, aching all over.  . Erythromycin Hives  . Pregabalin Other (See Comments)    Crossed vision.  Marland Kitchen Macrobid [Nitrofurantoin] Hives  . Carbidopa     Headaches, nausea, dizziness  . Gabapentin Other (See Comments)    Blurred vision  . Losartan     Family History  Problem Relation Age of Onset  . Prostate cancer Father   . Stroke Father      Prior to Admission medications   Medication Sig Start Date End Date Taking? Authorizing Provider  acetaminophen (TYLENOL) 500 MG tablet Take 500 mg by mouth every 6 (six) hours as needed for mild pain.   Yes Historical Provider, MD  aspirin 81 MG tablet Take 81 mg by mouth daily after breakfast.    Yes Historical Provider, MD  atorvastatin (LIPITOR) 10 MG tablet Take 10 mg by mouth daily.   Yes Historical Provider, MD  azelastine (ASTELIN) 0.1 % nasal spray Place 1 spray  into both nostrils 2 (two) times daily. Use in each nostril as directed   Yes Historical Provider, MD  Calcium Carbonate-Vitamin D (CALCIUM + D PO) Take 2 tablets by mouth at bedtime. 600-200 mg tab.   Yes Historical Provider, MD  Carbidopa-Levodopa ER (RYTARY) 23.75-95 MG CPCR Take 1 tablet by mouth 3 (three) times daily. 10/21/15  Yes Rebecca S Tat, DO  Cholecalciferol (VITAMIN D3) 5000 UNITS CAPS Take 5,000 Units by mouth daily.    Yes Historical Provider, MD  cycloSPORINE (RESTASIS) 0.05 % ophthalmic emulsion Place 1 drop into both eyes daily as needed.   Yes Historical Provider, MD  DULoxetine (CYMBALTA) 60 MG capsule Take 1 capsule (60 mg total) by mouth daily. 07/12/15  Yes Charlett Blake, MD  Magnesium 250 MG TABS Take 1 tablet by mouth at bedtime.    Yes Historical Provider, MD  Multiple Vitamin (MULTIVITAMIN) tablet Take 1 tablet by mouth daily.    Yes Historical Provider, MD  Omega-3 Fatty Acids (FISH OIL) 1200 MG CAPS Take 2  capsules by mouth at bedtime.    Yes Historical Provider, MD  SYNTHROID 150 MCG tablet Take 150 mcg by mouth daily before breakfast.  07/13/14  Yes Historical Provider, MD  telmisartan-hydrochlorothiazide (MICARDIS HCT) 40-12.5 MG per tablet Take 1 tablet by mouth daily.  07/14/14  Yes Historical Provider, MD  vitamin B-12 (CYANOCOBALAMIN) 1000 MCG tablet Take 1,000 mcg by mouth daily.   Yes Historical Provider, MD  warfarin (COUMADIN) 4 MG tablet Take 4 mg by mouth daily.   Yes Historical Provider, MD  zolpidem (AMBIEN) 10 MG tablet Take 1 tablet (10 mg total) by mouth at bedtime. 11/10/15  Yes [name] [name], MD  DULoxetine (CYMBALTA) 60 MG capsule Take 1 capsule (60 mg total) by mouth daily. Patient not taking: Reported on 12/17/2015 11/15/15   Charlett Blake, MD    Physical Exam: Vitals:   12/17/15 1130 12/17/15 1138 12/17/15 1200 12/17/15 1215  BP: 131/73 131/73 149/79 131/60  Pulse: 83 89 79 85  Resp:  20    Temp:      TempSrc:      SpO2: 95% 95% 93% 94%  Weight:      Height:          Constitutional: NAD, calm, uncomfortable and very to neck and shoulder pain Eyes: PERRL, lids and conjunctivae normal ENMT: Mucous membranes are moist. Posterior pharynx clear of any exudate or lesions.Normal dentition.  Neck: normal, supple, no masses, no thyromegaly-Cervical collar in place Respiratory: clear to auscultation bilaterally, no wheezing, no crackles. Normal respiratory effort. No accessory muscle use.  Cardiovascular: Regular rate and rhythm, no murmurs / rubs / gallops. No extremity edema. 2+ pedal pulses. No carotid bruits.  Abdomen: no tenderness, no masses palpated. No hepatosplenomegaly. Bowel sounds positive.  Musculoskeletal: no clubbing / cyanosis. No joint deformity upper and lower extremities. Good ROM except for right knee, no contractures. Normal muscle tone. Well old scar right anterior knee with decreased range of motion Skin: no rashes, lesions, ulcers. No  induration Neurologic: CN 2-12 grossly intact. Sensation intact, DTR normal. Strength 5/5 x all 4 extremities.  Psychiatric: Normal judgment and insight. Alert and oriented x 3. Normal mood.    Labs on Admission: I have personally reviewed following labs and imaging studies  CBC:  Recent Labs Lab 12/17/15 0738  WBC 17.4*  NEUTROABS 15.4*  HGB 14.0  HCT 42.4  MCV 88.1  PLT 0000000   Basic Metabolic Panel:  Recent Labs  Lab 12/17/15 0738  NA 136  K 3.5  CL 100*  CO2 28  GLUCOSE 181*  BUN 11  CREATININE 0.67  CALCIUM 9.2   GFR: Estimated Creatinine Clearance: 71.2 mL/min (by C-G formula based on SCr of 0.67 mg/dL). Liver Function Tests: No results for input(s): AST, ALT, ALKPHOS, BILITOT, PROT, ALBUMIN in the last 168 hours. No results for input(s): LIPASE, AMYLASE in the last 168 hours. No results for input(s): AMMONIA in the last 168 hours. Coagulation Profile:  Recent Labs Lab 12/15/15 1127 12/17/15 0738  INR 3.0 2.66   Cardiac Enzymes: No results for input(s): CKTOTAL, CKMB, CKMBINDEX, TROPONINI in the last 168 hours. BNP (last 3 results) No results for input(s): PROBNP in the last 8760 hours. HbA1C: No results for input(s): HGBA1C in the last 72 hours. CBG: No results for input(s): GLUCAP in the last 168 hours. Lipid Profile: No results for input(s): CHOL, HDL, LDLCALC, TRIG, CHOLHDL, LDLDIRECT in the last 72 hours. Thyroid Function Tests: No results for input(s): TSH, T4TOTAL, FREET4, T3FREE, THYROIDAB in the last 72 hours. Anemia Panel: No results for input(s): VITAMINB12, FOLATE, FERRITIN, TIBC, IRON, RETICCTPCT in the last 72 hours. Urine analysis:    Component Value Date/Time   COLORURINE AMBER (A) 12/17/2015 1052   APPEARANCEUR TURBID (A) 12/17/2015 1052   LABSPEC 1.017 12/17/2015 1052   PHURINE 7.5 12/17/2015 1052   GLUCOSEU NEGATIVE 12/17/2015 1052   HGBUR NEGATIVE 12/17/2015 [place] 12/17/2015 [place]  12/17/2015 1052   PROTEINUR NEGATIVE 12/17/2015 1052   UROBILINOGEN 0.2 09/07/2012 1049   NITRITE NEGATIVE 12/17/2015 1052   LEUKOCYTESUR TRACE (A) 12/17/2015 1052   Sepsis Labs: @LABRCNTIP (procalcitonin:4,lacticidven:4) )No results found for this or any previous visit (from the past 240 hour(s)).   Radiological Exams on Admission: Dg Chest 1 View  Result Date: 12/17/2015 CLINICAL DATA:  Fall EXAM: CHEST 1 VIEW COMPARISON:  11/26/2010 FINDINGS: Borderline cardiomegaly. No acute infiltrate or pleural effusion. No pulmonary edema. Mild degenerative changes mid thoracic spine. IMPRESSION: No active disease. Electronically Signed   By: Lahoma Crocker M.D.   On: 12/17/2015 11:24   Dg Pelvis 1-2 Views  Result Date: 12/17/2015 CLINICAL DATA:  Fall with C5 fracture. EXAM: PELVIS - 1-2 VIEW COMPARISON:  Abdominal CT 09/07/2012 FINDINGS: Limited evaluation of the sacrum due to bowel gas. No suspected fracture. No diastasis. Osteopenia. Lower lumbar facet arthropathy IMPRESSION: No acute finding. Electronically Signed   By: Monte Fantasia M.D.   On: 12/17/2015 11:18   Ct Head Wo Contrast  Result Date: 12/17/2015 CLINICAL DATA:  Posttraumatic headache and neck pain after fall off steps. EXAM: CT HEAD WITHOUT CONTRAST CT CERVICAL SPINE WITHOUT CONTRAST TECHNIQUE: Multidetector CT imaging of the head and cervical spine was performed following the standard protocol without intravenous contrast. Multiplanar CT image reconstructions of the cervical spine were also generated. COMPARISON:  CT scan of December 09, 2014. FINDINGS: CT HEAD FINDINGS Brain: Mild diffuse cortical atrophy is noted. Mild chronic ischemic white matter disease is noted. No mass effect or midline shift is noted. Ventricular size is within normal limits. There is no evidence of mass lesion, hemorrhage or acute infarction. Vascular: Atherosclerosis of internal carotid arteries is noted. Skull: Bony calvarium appears intact. Sinuses/Orbits:  Visualized paranasal sinuses appear normal. Other: None. CT CERVICAL SPINE FINDINGS Alignment: Normal. Skull base and vertebrae: Moderately displaced fracture is seen involving the posterior spinous process of C5. No other fracture is noted. Soft tissues and spinal canal: No definite  soft tissue abnormality is noted. Disc levels: Mild degenerative disc disease is noted at C6-7 with anterior and posterior osteophyte formation. Fusion of the left-sided posterior facet joints is noted at C2-3, C3-4, and C4-5. Upper chest: Visualized lung fields appear normal. Other: Atherosclerosis of thoracic aorta is noted. IMPRESSION: Mild diffuse cortical atrophy. Mild chronic ischemic white matter disease. No acute intracranial abnormality seen. Mild degenerative disc disease is noted at C6-7. Other degenerative changes are noted. Moderately displaced fracture is seen involving the posterior spinous process of C5. Critical Value/emergent results were called by telephone at the time of interpretation on 12/17/2015 at 9:08 am to Dr. Leo Grosser , who verbally acknowledged these results. Electronically Signed   By: Marijo Conception, M.D.   On: 12/17/2015 09:11   Ct Cervical Spine Wo Contrast  Result Date: 12/17/2015 CLINICAL DATA:  Posttraumatic headache and neck pain after fall off steps. EXAM: CT HEAD WITHOUT CONTRAST CT CERVICAL SPINE WITHOUT CONTRAST TECHNIQUE: Multidetector CT imaging of the head and cervical spine was performed following the standard protocol without intravenous contrast. Multiplanar CT image reconstructions of the cervical spine were also generated. COMPARISON:  CT scan of December 09, 2014. FINDINGS: CT HEAD FINDINGS Brain: Mild diffuse cortical atrophy is noted. Mild chronic ischemic white matter disease is noted. No mass effect or midline shift is noted. Ventricular size is within normal limits. There is no evidence of mass lesion, hemorrhage or acute infarction. Vascular: Atherosclerosis of internal  carotid arteries is noted. Skull: Bony calvarium appears intact. Sinuses/Orbits: Visualized paranasal sinuses appear normal. Other: None. CT CERVICAL SPINE FINDINGS Alignment: Normal. Skull base and vertebrae: Moderately displaced fracture is seen involving the posterior spinous process of C5. No other fracture is noted. Soft tissues and spinal canal: No definite soft tissue abnormality is noted. Disc levels: Mild degenerative disc disease is noted at C6-7 with anterior and posterior osteophyte formation. Fusion of the left-sided posterior facet joints is noted at C2-3, C3-4, and C4-5. Upper chest: Visualized lung fields appear normal. Other: Atherosclerosis of thoracic aorta is noted. IMPRESSION: Mild diffuse cortical atrophy. Mild chronic ischemic white matter disease. No acute intracranial abnormality seen. Mild degenerative disc disease is noted at C6-7. Other degenerative changes are noted. Moderately displaced fracture is seen involving the posterior spinous process of C5. Critical Value/emergent results were called by telephone at the time of interpretation on 12/17/2015 at 9:08 am to Dr. Leo Grosser , who verbally acknowledged these results. Electronically Signed   By: Marijo Conception, M.D.   On: 12/17/2015 09:11   Dg Shoulder Left  Result Date: 12/17/2015 CLINICAL DATA:  Status post fall down stairs this morning with a left shoulder injury. Pain. Initial encounter. EXAM: LEFT SHOULDER - 2+ VIEW COMPARISON:  None. FINDINGS: No acute bony or joint abnormality is identified. Mild to moderate acromioclavicular osteoarthritis is noted. Imaged left lung and ribs are unremarkable. IMPRESSION: No acute finding. Electronically Signed   By: Inge Rise M.D.   On: 12/17/2015 08:43     Assessment/Plan Principal Problem:   Displaced fracture of fifth cervical vertebra/ Intractable pain -Patient presents with neck and shoulder pain after falling down stairs backwards/losing balance CT showing displaced  C5 fracture associated with intractable pain and inability to mobilize independently while in the emergency department despite receipt of multiple narcotic pain medications prior to ambulation attempts -Begin oral pain medications for moderate pain and IV morphine for breakthrough pain and severe pain -Anticipate patient may require rehabilitative therapies prior to returning to home  environment so PT and OT evaluations have been requested as well as social work evaluation -Continue cervical collar as recommended by neurosurgeon  Active Problems:   Parkinson's disease/Gait disturbance/S/P right TKA -Patient reports recent issues with falls since the diagnosis of Parkinson's was given; this has further been exacerbated by prior right knee surgery and flexibility issues involving the right knee -PT/OT evaluation as above with anticipation will discharge to skilled nursing facility or rehabilitative facility prior to going home -Continue Sinemet    Hypercoagulable state/History of pulmonary embolism -Continue preadmission warfarin with pharmacy dosing    Hypothyroidism -Continue preadmission Synthroid    Hypertension -Continue myocardis hct    Dyslipidemia -Continue omega-3 fatty acids and Lipitor    Fibromyalgia syndrome/chronic low back pain -Continue preadmission Cymbalta       DVT prophylaxis: Warfarin Code Status: DO NOT RESUSCITATE Family Communication: Multiple family members at bedside with patient's permission  Disposition Plan: Anticipate patient will discharge to either rehabilitation or skilled nursing facility for further rehabilitation prior to returning to home environment Consults called: On-call neurosurgery consulted by EDP     ELLIS,ALLISON L. ANP-BC [facility] Pager [phone]   If 7PM-7AM, please contact night-coverage [email or web address] Password [email or web address]  12/17/2015, 1:48 PM

## 2015-12-17 NOTE — ED Notes (Addendum)
Patient comes from home by EMS for a fall and associated shoulder pain.  NIH was a 0.  PASSED STROKE SWALLOW SCREEN.  C-collar was applied and patient was originally non-compliant but now is not messing with it.  Has a fracture to C5 vertebrae so C-collar is necessary.  Patient is A&Ox4 at this time.  Please call [phone] for any questions.

## 2015-12-17 NOTE — ED Notes (Signed)
Myself and [place], EMT placed patient on bedpan and obtained urine and repositioned on stretcher; visitors at bedside

## 2015-12-18 DIAGNOSIS — Z86711 Personal history of pulmonary embolism: Secondary | ICD-10-CM

## 2015-12-18 DIAGNOSIS — E785 Hyperlipidemia, unspecified: Secondary | ICD-10-CM

## 2015-12-18 DIAGNOSIS — S12490S Other displaced fracture of fifth cervical vertebra, sequela: Secondary | ICD-10-CM

## 2015-12-18 LAB — CBC
HEMATOCRIT: 40.4 % (ref 36.0–46.0)
Hemoglobin: 13.4 g/dL (ref 12.0–15.0)
MCH: 29 pg (ref 26.0–34.0)
MCHC: 33.2 g/dL (ref 30.0–36.0)
MCV: 87.4 fL (ref 78.0–100.0)
PLATELETS: 264 10*3/uL (ref 150–400)
RBC: 4.62 MIL/uL (ref 3.87–5.11)
RDW: 13.4 % (ref 11.5–15.5)
WBC: 9.2 10*3/uL (ref 4.0–10.5)

## 2015-12-18 LAB — BASIC METABOLIC PANEL
Anion gap: 8 (ref 5–15)
BUN: 8 mg/dL (ref 6–20)
CO2: 27 mmol/L (ref 22–32)
CREATININE: 0.59 mg/dL (ref 0.44–1.00)
Calcium: 9.2 mg/dL (ref 8.9–10.3)
Chloride: 102 mmol/L (ref 101–111)
GFR calc Af Amer: >60 mL/min (ref >60)
GLUCOSE: 132 mg/dL — AB (ref 65–99)
POTASSIUM: 3.7 mmol/L (ref 3.5–5.1)
SODIUM: 137 mmol/L (ref 135–145)

## 2015-12-18 LAB — PROTIME-INR
INR: 2.77
Prothrombin Time: 29.9 seconds — ABNORMAL HIGH (ref 11.4–15.2)

## 2015-12-18 MED ORDER — HYDROCHLOROTHIAZIDE 12.5 MG PO CAPS
12.5000 mg | ORAL_CAPSULE | Freq: Every day | ORAL | Status: DC
  Administered 2015-12-19 – 2015-12-20 (×2): 12.5 mg via ORAL
  Filled 2015-12-18 (×2): qty 1

## 2015-12-18 MED ORDER — VALSARTAN 160 MG PO TABS
160.0000 mg | ORAL_TABLET | Freq: Every day | ORAL | Status: DC
  Administered 2015-12-18 – 2015-12-20 (×3): 160 mg via ORAL
  Filled 2015-12-18 (×3): qty 1

## 2015-12-18 MED ORDER — HYDROMORPHONE HCL 1 MG/ML IJ SOLN
1.0000 mg | INTRAMUSCULAR | Status: DC | PRN
  Administered 2015-12-18 – 2015-12-19 (×6): 1 mg via SUBCUTANEOUS
  Filled 2015-12-18 (×7): qty 1

## 2015-12-18 MED ORDER — WARFARIN SODIUM 4 MG PO TABS
4.0000 mg | ORAL_TABLET | Freq: Once | ORAL | Status: AC
  Administered 2015-12-18: 4 mg via ORAL
  Filled 2015-12-18: qty 1

## 2015-12-18 NOTE — Progress Notes (Signed)
ANTICOAGULATION CONSULT NOTE - Initial Consult  Pharmacy Consult for warfarin Indication: history of PE  Allergies  Allergen Reactions  . Crestor [Rosuvastatin Calcium] Other (See Comments)    Feeling very bad, aching all over.  . Erythromycin Hives  . Pregabalin Other (See Comments)    Crossed vision.  Marland Kitchen Macrobid [Nitrofurantoin] Hives  . Carbidopa     Headaches, nausea, dizziness  . Gabapentin Other (See Comments)    Blurred vision  . Losartan     Patient Measurements: Height: 5\' 6"  (167.6 cm) Weight: 195 lb (88.5 kg) IBW/kg (Calculated) : 59.3   Vital Signs: Temp: 98.6 F (37 C) (10/14 0900) Temp Source: Oral (10/14 0900) BP: 151/66 (10/14 0900) Pulse Rate: 77 (10/14 0900)  Labs:  Recent Labs  12/17/15 0738 12/18/15 0316  HGB 14.0 13.4  HCT 42.4 40.4  PLT 258 264  LABPROT 28.9* 29.9*  INR 2.66 2.77  CREATININE 0.67 0.59    Estimated Creatinine Clearance: 71.2 mL/min (by C-G formula based on SCr of 0.59 mg/dL).   Medical History: Past Medical History:  Diagnosis Date  . Arthritis   . Chronic insomnia   . Complication of anesthesia    severe itching night of surgery requiring meds- also couldnt swallow- throat "paralyzed""  . Fibromyalgia   . Glaucoma   . Hypercholesterolemia   . Hyperlipidemia   . Hypertension    PCP Dr Cari Caraway- [place]  05/15/11 with clearance and note on chart,    chest x ray, EKG 12/12 [facility], eccho 7/11 [facility]  . Hypothyroidism    s/p graves disease  . Kidney stone   . PD (Parkinson's disease) ([place])   . Peripheral vascular disease ([place])    PULMONARY EMBOLUS x 2- 2011, 2012/ FOLLOWED BY DR [id number]-LOV 12/12 [facility]   PT STAES WILL STOP COUMADIN 3/12 and begin LOVONOX 05/17/11 as previously instructed  . Sinus infection    at present- dr Honor Loh PA aware- was seen there and at PCP 05/10/11  . Thyroid disease    Hypothyroidism    Medications:  Prescriptions Prior to Admission  Medication Sig Dispense Refill Last Dose  .  acetaminophen (TYLENOL) 500 MG tablet Take 500 mg by mouth every 6 (six) hours as needed for mild pain.   Past Month at Unknown time  . aspirin 81 MG tablet Take 81 mg by mouth daily after breakfast.    12/16/2015 at Unknown time  . atorvastatin (LIPITOR) 10 MG tablet Take 10 mg by mouth daily.   12/16/2015 at Unknown time  . azelastine (ASTELIN) 0.1 % nasal spray Place 1 spray into both nostrils 2 (two) times daily. Use in each nostril as directed   12/16/2015 at Unknown time  . Calcium Carbonate-Vitamin D (CALCIUM + D PO) Take 2 tablets by mouth at bedtime. 600-200 mg tab.   Past Month at Unknown time  . Carbidopa-Levodopa ER (RYTARY) 23.75-95 MG CPCR Take 1 tablet by mouth 3 (three) times daily. 100 capsule 0 12/16/2015 at Unknown time  . Cholecalciferol (VITAMIN D3) 5000 UNITS CAPS Take 5,000 Units by mouth daily.    Past Month at Unknown time  . cycloSPORINE (RESTASIS) 0.05 % ophthalmic emulsion Place 1 drop into both eyes daily as needed.   Past Week at Unknown time  . DULoxetine (CYMBALTA) 60 MG capsule Take 1 capsule (60 mg total) by mouth daily. 30 capsule 5 12/16/2015 at Unknown time  . Magnesium 250 MG TABS Take 1 tablet by mouth at bedtime.    12/16/2015 at  Unknown time  . Multiple Vitamin (MULTIVITAMIN) tablet Take 1 tablet by mouth daily.    12/16/2015 at Unknown time  . Omega-3 Fatty Acids (FISH OIL) 1200 MG CAPS Take 2 capsules by mouth at bedtime.    12/16/2015 at Unknown time  . SYNTHROID 150 MCG tablet Take 150 mcg by mouth daily before breakfast.    12/16/2015 at Unknown time  . telmisartan-hydrochlorothiazide (MICARDIS HCT) 40-12.5 MG per tablet Take 1 tablet by mouth daily.   1 12/16/2015 at Unknown time  . vitamin B-12 (CYANOCOBALAMIN) 1000 MCG tablet Take 1,000 mcg by mouth daily.   12/16/2015 at Unknown time  . warfarin (COUMADIN) 4 MG tablet Take 4 mg by mouth daily.   12/16/2015 at 1800  . zolpidem (AMBIEN) 10 MG tablet Take 1 tablet (10 mg total) by mouth at bedtime. 30  tablet 2 12/16/2015 at Unknown time  . DULoxetine (CYMBALTA) 60 MG capsule Take 1 capsule (60 mg total) by mouth daily. (Patient not taking: Reported on 12/17/2015) 90 capsule 0 Not Taking at Unknown time    Assessment: Pt admitted s/p fall. No imaging evidence of bleeding. No bleeding reported. Pt takes PTA warfarin for hypercoaguable state. Dose is 4mg  daily. INR 10/11 in clinic was 3.0, INR is therapeutic today at 2.77. H/H and Plt WNL. No bleeding noted.  Goal of Therapy:  INR 2-3 Monitor platelets by anticoagulation protocol: Yes   Plan:  Warfarin 4 mg PO x1 Daily INR, CBC Monitor s/sx bleeding, DDI, PO intake  Carlean Jews, Pharm.D. PGY1 Pharmacy Resident 10/14/201712:12 PM Pager [phone]

## 2015-12-18 NOTE — Progress Notes (Signed)
Patient ID: [name] [name], female   DOB: 1946-01-23, 70 y.o.   MRN: [id number]  PROGRESS NOTE    [name] [name]  [id number] DOB: December 08, 1945 DOA: 12/17/2015  PCP: Lamar Blinks, MD   Brief Narrative:  70 y.o. female with past medical history significant for hypercoagulable disorder and history of PE on chronic Coumadin anticoagulation, hypothyroidism, Parkinson's disease with associated gait disturbance, hypothyroidism, fibromyalgia, history of prior right knee surgery further influencing gait disturbance. Patient reported 7 falls in the past 2 years. Most recently she was walking up the stairs and fell backwards and then had severe back and neck pain.  In ED, pt was hemodynamically stable. CT head and cervical spine showed diffuse cortical atrophy without acute intracranial abnormality seen, mild degenerative disc disease noted at C6-C7 with a moderately displaced fracture involving the posterior spinous process at C5. Shoulder x ray showed no fracture. CXR showed no active disease. X ray pelvis showed no fractures. Neck collar was placed and she was admitted for pain management.   Assessment & Plan:   Principal Problem:   Displaced fracture of fifth cervical vertebra (HCC) / Frequent falls  - Appreciate neurosurgery recommendations - Has neck collar - Obtain PT eval - Continue pain management efforts    Active Problems:   S/P right TKA - Stable    Hypercoagulable state ([place]) / History of pulmonary embolism - Continue coumadin    Hypothyroidism - Continue synthroid    Parkinson's disease ([place]) - Continue sinemet    Essential hypertension - Continue hctz and Diovan     Dyslipidemia - Continue Lipitor and omega 3    DVT prophylaxis: on Coumadin Code Status: full code  Family Communication: no family at the bedside Disposition Plan: home likely by Monday    Consultants:   Neurosurgery   Procedures:  None   Antimicrobials:   None   Subjective: Has neck pain  this am.  Objective: Vitals:   12/18/15 0600 12/18/15 0900 12/18/15 1412 12/18/15 1703  BP: (!) 141/68 (!) 151/66 (!) 148/69 133/66  Pulse: 82 77 74 84  Resp: 18 18 18 18   Temp: 99.1 F (37.3 C) 98.6 F (37 C) 98.3 F (36.8 C) 98.4 F (36.9 C)  TempSrc: Oral Oral Oral Oral  SpO2: 93% 94% 95% 94%  Weight:      Height:        Intake/Output Summary (Last 24 hours) at 12/18/15 1720 Last data filed at 12/17/15 1841  Gross per 24 hour  Intake              120 ml  Output                0 ml  Net              120 ml   Filed Weights   12/17/15 0622  Weight: 88.5 kg (195 lb)    Examination:  General exam: Appears calm and comfortable; has neck collar Respiratory system: Clear to auscultation. Respiratory effort normal. Cardiovascular system: S1 & S2 heard, RRR. No JVD, murmurs, rubs, gallops or clicks. No pedal edema. Gastrointestinal system: Abdomen is nondistended, soft and nontender. No organomegaly or masses felt. Normal bowel sounds heard. Central nervous system: Alert and oriented. No focal neurological deficits. Extremities: Symmetric 5 x 5 power. Skin: No rashes, lesions or ulcers Psychiatry: Judgement and insight appear normal. Mood & affect appropriate.   Data Reviewed: I have personally reviewed following labs and imaging studies  CBC:  Recent Labs Lab 12/17/15 0738  12/18/15 0316  WBC 17.4* 9.2  NEUTROABS 15.4*  --   HGB 14.0 13.4  HCT 42.4 40.4  MCV 88.1 87.4  PLT 258 [id number]   Basic Metabolic Panel:  Recent Labs Lab 12/17/15 0738 12/18/15 0316  NA 136 137  K 3.5 3.7  CL 100* 102  CO2 28 27  GLUCOSE 181* 132*  BUN 11 8  CREATININE 0.67 0.59  CALCIUM 9.2 9.2   GFR: Estimated Creatinine Clearance: 71.2 mL/min (by C-G formula based on SCr of 0.59 mg/dL). Liver Function Tests: No results for input(s): AST, ALT, ALKPHOS, BILITOT, PROT, ALBUMIN in the last 168 hours. No results for input(s): LIPASE, AMYLASE in the last 168 hours. No results for  input(s): AMMONIA in the last 168 hours. Coagulation Profile:  Recent Labs Lab 12/15/15 1127 12/17/15 0738 12/18/15 0316  INR 3.0 2.66 2.77   Cardiac Enzymes: No results for input(s): CKTOTAL, CKMB, CKMBINDEX, TROPONINI in the last 168 hours. BNP (last 3 results) No results for input(s): PROBNP in the last 8760 hours. HbA1C: No results for input(s): HGBA1C in the last 72 hours. CBG: No results for input(s): GLUCAP in the last 168 hours. Lipid Profile: No results for input(s): CHOL, HDL, LDLCALC, TRIG, CHOLHDL, LDLDIRECT in the last 72 hours. Thyroid Function Tests: No results for input(s): TSH, T4TOTAL, FREET4, T3FREE, THYROIDAB in the last 72 hours. Anemia Panel: No results for input(s): VITAMINB12, FOLATE, FERRITIN, TIBC, IRON, RETICCTPCT in the last 72 hours. Urine analysis:    Component Value Date/Time   COLORURINE AMBER (A) 12/17/2015 1052   APPEARANCEUR TURBID (A) 12/17/2015 1052   LABSPEC 1.017 12/17/2015 1052   PHURINE 7.5 12/17/2015 1052   GLUCOSEU NEGATIVE 12/17/2015 1052   HGBUR NEGATIVE 12/17/2015 [place] 12/17/2015 [place] 12/17/2015 1052   PROTEINUR NEGATIVE 12/17/2015 1052   UROBILINOGEN 0.2 09/07/2012 1049   NITRITE NEGATIVE 12/17/2015 1052   LEUKOCYTESUR TRACE (A) 12/17/2015 1052   Sepsis Labs: @LABRCNTIP (procalcitonin:4,lacticidven:4)   )No results found for this or any previous visit (from the past 240 hour(s)).    Radiology Studies: Dg Chest 1 View  Result Date: 12/17/2015 CLINICAL DATA:  Fall EXAM: CHEST 1 VIEW COMPARISON:  11/26/2010 FINDINGS: Borderline cardiomegaly. No acute infiltrate or pleural effusion. No pulmonary edema. Mild degenerative changes mid thoracic spine. IMPRESSION: No active disease. Electronically Signed   By: Lahoma Crocker M.D.   On: 12/17/2015 11:24   Dg Pelvis 1-2 Views  Result Date: 12/17/2015 CLINICAL DATA:  Fall with C5 fracture. EXAM: PELVIS - 1-2 VIEW COMPARISON:  Abdominal CT  09/07/2012 FINDINGS: Limited evaluation of the sacrum due to bowel gas. No suspected fracture. No diastasis. Osteopenia. Lower lumbar facet arthropathy IMPRESSION: No acute finding. Electronically Signed   By: Monte Fantasia M.D.   On: 12/17/2015 11:18   Ct Head Wo Contrast  Result Date: 12/17/2015 CLINICAL DATA:  Posttraumatic headache and neck pain after fall off steps. EXAM: CT HEAD WITHOUT CONTRAST CT CERVICAL SPINE WITHOUT CONTRAST TECHNIQUE: Multidetector CT imaging of the head and cervical spine was performed following the standard protocol without intravenous contrast. Multiplanar CT image reconstructions of the cervical spine were also generated. COMPARISON:  CT scan of December 09, 2014. FINDINGS: CT HEAD FINDINGS Brain: Mild diffuse cortical atrophy is noted. Mild chronic ischemic white matter disease is noted. No mass effect or midline shift is noted. Ventricular size is within normal limits. There is no evidence of mass lesion, hemorrhage or acute infarction. Vascular: Atherosclerosis of internal carotid  arteries is noted. Skull: Bony calvarium appears intact. Sinuses/Orbits: Visualized paranasal sinuses appear normal. Other: None. CT CERVICAL SPINE FINDINGS Alignment: Normal. Skull base and vertebrae: Moderately displaced fracture is seen involving the posterior spinous process of C5. No other fracture is noted. Soft tissues and spinal canal: No definite soft tissue abnormality is noted. Disc levels: Mild degenerative disc disease is noted at C6-7 with anterior and posterior osteophyte formation. Fusion of the left-sided posterior facet joints is noted at C2-3, C3-4, and C4-5. Upper chest: Visualized lung fields appear normal. Other: Atherosclerosis of thoracic aorta is noted. IMPRESSION: Mild diffuse cortical atrophy. Mild chronic ischemic white matter disease. No acute intracranial abnormality seen. Mild degenerative disc disease is noted at C6-7. Other degenerative changes are noted. Moderately  displaced fracture is seen involving the posterior spinous process of C5. Critical Value/emergent results were called by telephone at the time of interpretation on 12/17/2015 at 9:08 am to Dr. Leo Grosser , who verbally acknowledged these results. Electronically Signed   By: Marijo Conception, M.D.   On: 12/17/2015 09:11   Ct Cervical Spine Wo Contrast  Result Date: 12/17/2015 CLINICAL DATA:  Posttraumatic headache and neck pain after fall off steps. EXAM: CT HEAD WITHOUT CONTRAST CT CERVICAL SPINE WITHOUT CONTRAST TECHNIQUE: Multidetector CT imaging of the head and cervical spine was performed following the standard protocol without intravenous contrast. Multiplanar CT image reconstructions of the cervical spine were also generated. COMPARISON:  CT scan of December 09, 2014. FINDINGS: CT HEAD FINDINGS Brain: Mild diffuse cortical atrophy is noted. Mild chronic ischemic white matter disease is noted. No mass effect or midline shift is noted. Ventricular size is within normal limits. There is no evidence of mass lesion, hemorrhage or acute infarction. Vascular: Atherosclerosis of internal carotid arteries is noted. Skull: Bony calvarium appears intact. Sinuses/Orbits: Visualized paranasal sinuses appear normal. Other: None. CT CERVICAL SPINE FINDINGS Alignment: Normal. Skull base and vertebrae: Moderately displaced fracture is seen involving the posterior spinous process of C5. No other fracture is noted. Soft tissues and spinal canal: No definite soft tissue abnormality is noted. Disc levels: Mild degenerative disc disease is noted at C6-7 with anterior and posterior osteophyte formation. Fusion of the left-sided posterior facet joints is noted at C2-3, C3-4, and C4-5. Upper chest: Visualized lung fields appear normal. Other: Atherosclerosis of thoracic aorta is noted. IMPRESSION: Mild diffuse cortical atrophy. Mild chronic ischemic white matter disease. No acute intracranial abnormality seen. Mild degenerative  disc disease is noted at C6-7. Other degenerative changes are noted. Moderately displaced fracture is seen involving the posterior spinous process of C5. Critical Value/emergent results were called by telephone at the time of interpretation on 12/17/2015 at 9:08 am to Dr. Leo Grosser , who verbally acknowledged these results. Electronically Signed   By: Marijo Conception, M.D.   On: 12/17/2015 09:11   Dg Shoulder Left  Result Date: 12/17/2015 CLINICAL DATA:  Status post fall down stairs this morning with a left shoulder injury. Pain. Initial encounter. EXAM: LEFT SHOULDER - 2+ VIEW COMPARISON:  None. FINDINGS: No acute bony or joint abnormality is identified. Mild to moderate acromioclavicular osteoarthritis is noted. Imaged left lung and ribs are unremarkable. IMPRESSION: No acute finding. Electronically Signed   By: Inge Rise M.D.   On: 12/17/2015 08:43        Scheduled Meds: . aspirin EC  81 mg Oral QPC breakfast  . atorvastatin  10 mg Oral Daily  . azelastine  1 spray Each Nare BID  .  Carbidopa-Levodopa ER  1 tablet Oral TID  . cycloSPORINE  1 drop Both Eyes BID  . DULoxetine  60 mg Oral Daily  . valsartan  160 mg Oral Daily   And  . hydrochlorothiazide  12.5 mg Oral Daily  . levothyroxine  150 mcg Oral QAC breakfast  . magnesium oxide  200 mg Oral Daily  . multivitamin with minerals  1 tablet Oral Daily  . omega-3 acid ethyl esters  1 g Oral Daily  . Warfarin - Pharmacist Dosing Inpatient   Does not apply q1800  . zolpidem  5 mg Oral QHS   Continuous Infusions: . sodium chloride 10 mL/hr at 12/17/15 1830     LOS: 1 day    Time spent: 15 minutes  Greater than 50% of the time spent on counseling and coordinating the care.   Leisa Lenz, MD [facility] Pager [phone]  If 7PM-7AM, please contact night-coverage [email or web address] Password [email or web address] 12/18/2015, 5:20 PM

## 2015-12-18 NOTE — Evaluation (Signed)
Physical Therapy Evaluation Patient Details Name: [name] [name] MRN: [id number] DOB: 1945/12/22 Today's Date: 12/18/2015   History of Present Illness  70 y.o. female with a Past Medical History of parkinsons and FM who presents with fall. Xray revealed displaced C5 fx.  Clinical Impression  Pt admitted with above diagnosis. Pt currently with functional limitations due to the deficits listed below (see PT Problem List). On eval, mobility significantly limited by pain. Pt required mod assist bed mobility. She was unable to tolerate supine to sit transfer to EOB. PT to continue to assess mobility during subsequent sessions as pain allows. Pt will benefit from skilled PT to increase their independence and safety with mobility to allow discharge to the venue listed below.       Follow Up Recommendations Supervision/Assistance - 24 hour;SNF    Equipment Recommendations  None recommended by PT    Recommendations for Other Services       Precautions / Restrictions Precautions Precautions: Fall Required Braces or Orthoses: Cervical Brace Cervical Brace: Hard collar;At all times      Mobility  Bed Mobility Overal bed mobility: Needs Assistance Bed Mobility: Rolling;Supine to Sit Rolling: Mod assist   Supine to sit: Mod assist     General bed mobility comments: +rail, verbal cues for sequencing, use of bed pad to initiate roll; Attempted supine to sit from left sidelying. Approx halfway, pt impulsively returned to supine stating the pain was too great.  Transfers                 General transfer comment: unable  Ambulation/Gait             General Gait Details: unable  Stairs            Wheelchair Mobility    Modified Rankin (Stroke Patients Only)       Balance                                             Pertinent Vitals/Pain Pain Assessment: 0-10 Pain Score: 10-Worst pain ever Pain Location: neck/L shoulder Pain Descriptors /  Indicators: Spasm;Shooting;Sharp Pain Intervention(s): Premedicated before session;Limited activity within patient's tolerance;Monitored during session    [place] expects to be discharged to:: Private residence Living Arrangements: Alone Available Help at Discharge: Family;Available PRN/intermittently Type of Home: House Home Access: Stairs to enter Entrance Stairs-Rails: Right;Left;Can reach both Entrance Stairs-Number of Steps: "several" Home Layout: Multi-level;Bed/bath upstairs;Able to live on main level with bedroom/bathroom Home Equipment: Bedside commode;Walker - 4 wheels;Walker - 2 wheels Additional Comments: Pt has rollator on main level and RW upstairs.    Prior Function Level of Independence: Independent with assistive device(s)         Comments: Pt still drives. Ambulates in the community with rollator.     Hand Dominance        Extremity/Trunk Assessment   Upper Extremity Assessment: Defer to OT evaluation           Lower Extremity Assessment: Generalized weakness         Communication   Communication: No difficulties  Cognition Arousal/Alertness: Awake/alert Behavior During Therapy: Anxious Overall Cognitive Status: Within Functional Limits for tasks assessed                      General Comments      Exercises     Assessment/Plan  PT Assessment Patient needs continued PT services  PT Problem List Decreased activity tolerance;Decreased mobility;Decreased safety awareness;Decreased knowledge of precautions;Pain          PT Treatment Interventions DME instruction;Gait training;Stair training;Functional mobility training;Therapeutic activities;Patient/family education;Balance training;Therapeutic exercise    PT Goals (Current goals can be found in the Care Plan section)  Acute Rehab PT Goals Patient Stated Goal: home PT Goal Formulation: With patient Time For Goal Achievement: 01/01/16 Potential to Achieve  Goals: Good    Frequency Min 3X/week   Barriers to discharge Decreased caregiver support      Co-evaluation               End of Session Equipment Utilized During Treatment: Cervical collar Activity Tolerance: Patient limited by pain Patient left: in bed;with family/visitor present;with call bell/phone within reach Nurse Communication: Mobility status         Time: [id number] PT Time Calculation (min) (ACUTE ONLY): 19 min   Charges:   PT Evaluation $PT Eval Moderate Complexity: 1 Procedure     PT G Codes:        [name] 12/18/2015, 1:05 PM

## 2015-12-19 DIAGNOSIS — G20 Parkinson's disease: Secondary | ICD-10-CM

## 2015-12-19 DIAGNOSIS — E038 Other specified hypothyroidism: Secondary | ICD-10-CM

## 2015-12-19 DIAGNOSIS — D6859 Other primary thrombophilia: Secondary | ICD-10-CM

## 2015-12-19 LAB — PROTIME-INR
INR: 3.13
Prothrombin Time: 32.9 seconds — ABNORMAL HIGH (ref 11.4–15.2)

## 2015-12-19 MED ORDER — POLYETHYLENE GLYCOL 3350 17 G PO PACK
17.0000 g | PACK | Freq: Every day | ORAL | Status: DC
  Administered 2015-12-19 – 2015-12-20 (×2): 17 g via ORAL
  Filled 2015-12-19 (×2): qty 1

## 2015-12-19 MED ORDER — WARFARIN SODIUM 2 MG PO TABS
2.0000 mg | ORAL_TABLET | Freq: Once | ORAL | Status: AC
  Administered 2015-12-19: 2 mg via ORAL
  Filled 2015-12-19: qty 1

## 2015-12-19 NOTE — Evaluation (Addendum)
Occupational Therapy Evaluation Patient Details Name: [name] [name] MRN: [id number] DOB: 11/01/1945 Today's Date: 12/19/2015    History of Present Illness 70 y.o. female with a Past Medical History of parkinsons and FM who presents with fall. Xray revealed displaced C5 fx.   Clinical Impression   This 69 yo female admitted with above presents to acute OT with deficits below (see OT problem list) thus affecting her PLOF of Mod I at home alone. She will benefit from acute OT with follow up OT on CIR to work back towards PLOF.    Follow Up Recommendations  CIR;Supervision/Assistance - 24 hour    Equipment Recommendations  Other (comment) (TBD at next venue)       Precautions / Restrictions Precautions Precautions: Fall Required Braces or Orthoses: Cervical Brace Cervical Brace: Hard collar;At all times Restrictions Weight Bearing Restrictions: No      Mobility Bed Mobility Overal bed mobility: Needs Assistance Bed Mobility: Supine to Sit     Supine to sit: HOB elevated;Max assist     General bed mobility comments: use of rail , verbal cues for hand placement, use of bed pad to help shift hips around  Transfers Overall transfer level: Needs assistance Equipment used:  (1 person standing in front of her with gait belt with Bil hand A ) Transfers: Sit to/from [facility] Sit to Stand: Mod assist Stand pivot transfers: Mod assist            Balance Overall balance assessment: Needs assistance Sitting-balance support: Bilateral upper extremity supported;Feet supported Sitting balance-Leahy Scale: Poor Sitting balance - Comments: right lateral and posterior lean   Standing balance support: Bilateral upper extremity supported;During functional activity Standing balance-Leahy Scale: Poor Standing balance comment: reliant of both hands on my arms for stand pivot                            ADL Overall ADL's : Needs  assistance/impaired Eating/Feeding: Moderate assistance;Bed level   Grooming: Wash/dry face;Wash/dry hands;Set up;Supervision/safety (sitting in recliner)   Upper Body Bathing: Moderate assistance;Sitting (in recliner)   Lower Body Bathing: Total assistance (mod A sit<>stand with 2nd person to help wash)   Upper Body Dressing : Total assistance (sitting in recliner)   Lower Body Dressing: Total assistance (mod A sit<>stand with 2nd person to help with clothes)   Toilet Transfer: Moderate assistance;Stand-pivot;BSC (with extreme increased time and A for weight shifts)   Toileting- Clothing Manipulation and Hygiene: Total assistance (mod A sit<>stand with 2nd person to help wash and to A with clothing)                         Pertinent Vitals/Pain Pain Assessment: No/denies pain     Hand Dominance Right   Extremity/Trunk Assessment Upper Extremity Assessment Upper Extremity Assessment: Generalized weakness           Communication Communication Communication:  (slow to respond at times verbally and physically)   Cognition Arousal/Alertness: Awake/alert Behavior During Therapy: WFL for tasks assessed/performed Overall Cognitive Status:  (slow to follow 1-step commands (verbal and physical))                                Home Living Family/patient expects to be discharged to:: Skilled nursing facility  Home Equipment: Bedside commode;Walker - 4 wheels;Walker - 2 wheels   Additional Comments: Pt has rollator on main level and RW upstairs.      Prior Functioning/Environment Level of Independence: Independent with assistive device(s)        Comments: Pt still drives. Ambulates in the community with rollator.        OT Problem List: Impaired balance (sitting and/or standing);Obesity;Decreased strength;Decreased range of motion;Decreased safety awareness   OT Treatment/Interventions: Self-care/ADL  training;Therapeutic activities;Patient/family education;Balance training    OT Goals(Current goals can be found in the care plan section) Acute Rehab OT Goals Patient Stated Goal: to go to bathroom OT Goal Formulation: With patient Time For Goal Achievement: 01/02/16 Potential to Achieve Goals: Good ADL Goals Pt Will Perform Grooming: with min assist;standing (2 task, standing with Denna Haggard at sink) Pt Will Perform Upper Body Bathing: with min guard assist;sitting (with increased time) Pt Will Perform Lower Body Bathing:  (pt will be able to stand with minguard A to A with this task) Pt Will Transfer to Toilet: with min assist;ambulating;bedside commode (over toilet) Additional ADL Goal #1: Pt will be Min A OOB with HOB up and use of rail in prep for basic ADLs  OT Frequency: Min 2X/week   Barriers to D/C: Decreased caregiver support             End of Session Equipment Utilized During Treatment: Gait belt Nurse Communication: Mobility status;Need for lift equipment (made NT aware as well--Sara Stedy; RN to look into getting her new pads for c-collar due to they are soiled)  Activity Tolerance:   Patient left: in chair;with call bell/phone within reach;with chair alarm set;with nursing/sitter in room   Time: [id number] OT Time Calculation (min): 36 min Charges:  OT General Charges $OT Visit: 1 Procedure OT Evaluation $OT Eval Moderate Complexity: 1 Procedure OT Treatments $Self Care/Home Management : 8-22 mins  Almon Register [id number] 12/19/2015, 10:17 AM

## 2015-12-19 NOTE — Progress Notes (Signed)
Physical Therapy Treatment Patient Details Name: [name] [name] MRN: [id number] DOB: 06/19/1945 Today's Date: 12/19/2015    History of Present Illness 70 y.o. female with a Past Medical History of parkinsons and FM who presents with fall. Xray revealed displaced C5 fx.    PT Comments    Increased participation noted today with therapy. She was able to tolerate OOB in recliner. Frequency updated from 3 to 5 x week. Pt agreeable to CIR. D/C recommendation updated.   Follow Up Recommendations  CIR     Equipment Recommendations  None recommended by PT    Recommendations for Other Services       Precautions / Restrictions Precautions Precautions: Fall;Cervical Required Braces or Orthoses: Cervical Brace Cervical Brace: Hard collar;At all times    Mobility  Bed Mobility               General bed mobility comments: Pt up in recliner.  Transfers Overall transfer level: Needs assistance Equipment used: Rolling walker (2 wheeled) Transfers: Sit to/from Stand Sit to Stand: Max assist         General transfer comment: verbal cues for hand placement, assist with anterior weight shift to power up. Pt retropulsive with stance. Mod assist required to maintain standing. Pt able to stand x 3 trials.  Ambulation/Gait             General Gait Details: unable to progress LE for gait   Stairs            Wheelchair Mobility    Modified Rankin (Stroke Patients Only)       Balance           Standing balance support: Bilateral upper extremity supported;During functional activity Standing balance-Leahy Scale: Poor Standing balance comment: retropulsive                    Cognition Arousal/Alertness: Awake/alert Behavior During Therapy: WFL for tasks assessed/performed Overall Cognitive Status: Impaired/Different from baseline Area of Impairment: Orientation;Attention;Memory;Following commands;Safety/judgement;Problem solving Orientation Level:  Disoriented to;Situation;Time Current Attention Level: Selective Memory: Decreased recall of precautions;Decreased short-term memory Following Commands: Follows one step commands with increased time;Follows multi-step commands inconsistently Safety/Judgement: Decreased awareness of safety;Decreased awareness of deficits   Problem Solving: Slow processing;Decreased initiation;Difficulty sequencing;Requires verbal cues;Requires tactile cues      Exercises      General Comments        Pertinent Vitals/Pain Pain Assessment: Faces Faces Pain Scale: Hurts even more Pain Location: neck/shoulders Pain Descriptors / Indicators: Sore Pain Intervention(s): Premedicated before session;Monitored during session;Limited activity within patient's tolerance    Home Living                      Prior Function            PT Goals (current goals can now be found in the care plan section) Acute Rehab PT Goals Patient Stated Goal: home PT Goal Formulation: With patient Time For Goal Achievement: 01/01/16 Potential to Achieve Goals: Good Progress towards PT goals: Progressing toward goals    Frequency    Min 5X/week      PT Plan Discharge plan needs to be updated;Frequency needs to be updated    Co-evaluation             End of Session Equipment Utilized During Treatment: Gait belt;Cervical collar Activity Tolerance: Patient limited by pain;Patient limited by fatigue Patient left: in chair;with call bell/phone within reach     Time: [id number] PT Time  Calculation (min) (ACUTE ONLY): 32 min  Charges:  $Therapeutic Activity: 23-37 mins                    G Codes:      [name] 12/19/2015, 2:17 PM

## 2015-12-19 NOTE — Progress Notes (Signed)
ANTICOAGULATION CONSULT NOTE - Follow up [place] for warfarin Indication: history of PE  Allergies  Allergen Reactions  . Crestor [Rosuvastatin Calcium] Other (See Comments)    Feeling very bad, aching all over.  . Erythromycin Hives  . Pregabalin Other (See Comments)    Crossed vision.  Marland Kitchen Macrobid [Nitrofurantoin] Hives  . Carbidopa     Headaches, nausea, dizziness  . Gabapentin Other (See Comments)    Blurred vision  . Losartan     Patient Measurements: Height: 5\' 6"  (167.6 cm) Weight: 195 lb (88.5 kg) IBW/kg (Calculated) : 59.3   Vital Signs: Temp: 98 F (36.7 C) (10/15 0855) Temp Source: Oral (10/15 0855) BP: 139/79 (10/15 0855) Pulse Rate: 116 (10/15 0855)  Labs:  Recent Labs  12/17/15 0738 12/18/15 0316 12/19/15 0219  HGB 14.0 13.4  --   HCT 42.4 40.4  --   PLT 258 264  --   LABPROT 28.9* 29.9* 32.9*  INR 2.66 2.77 3.13  CREATININE 0.67 0.59  --     Estimated Creatinine Clearance: 71.2 mL/min (by C-G formula based on SCr of 0.59 mg/dL).   Medical History: Past Medical History:  Diagnosis Date  . Arthritis   . Chronic insomnia   . Complication of anesthesia    severe itching night of surgery requiring meds- also couldnt swallow- throat "paralyzed""  . Fibromyalgia   . Glaucoma   . Hypercholesterolemia   . Hyperlipidemia   . Hypertension    PCP Dr Cari Caraway- [place]  05/15/11 with clearance and note on chart,    chest x ray, EKG 12/12 [facility], eccho 7/11 [facility]  . Hypothyroidism    s/p graves disease  . Kidney stone   . PD (Parkinson's disease) ([place])   . Peripheral vascular disease ([place])    PULMONARY EMBOLUS x 2- 2011, 2012/ FOLLOWED BY DR [id number]-[id number] 12/12 [facility]   PT STAES WILL STOP COUMADIN 3/12 and begin LOVONOX 05/17/11 as previously instructed  . Sinus infection    at present- dr Honor Loh PA aware- was seen there and at PCP 05/10/11  . Thyroid disease    Hypothyroidism    Assessment: Pt admitted s/p fall. No imaging evidence of  bleeding. No bleeding reported. Pt takes PTA warfarin for hypercoaguable state. PTA dose is 4mg  daily. INR 10/11 in clinic was 3.0,  INR is supratherapeutic today at 3.13.  Per nursing, patient has been eating 25-30% of meals.  H/H and Plt WNL as of 10/14. No bleeding noted.   Goal of Therapy:  INR 2-3 Monitor platelets by anticoagulation protocol: Yes   Plan:  Decrease warfarin to 2 mg PO x1 Daily INR, CBC Monitor s/sx bleeding, DDI, PO intake  Carlean Jews, Pharm.D. PGY1 Pharmacy Resident 10/15/20179:41 AM Pager [phone]

## 2015-12-19 NOTE — Progress Notes (Signed)
Inpatient Rehabilitation  Per PT and OT request, patient was screened by Gunnar Fusi for appropriateness for an Inpatient Acute Rehab consult.  Note that at this time a consult order has been placed.  Plan for and admissions coordinator to follow up after completion of the consult.    [name]., CCC/SLP Admission Coordinator  [facility]  Cell [phone]

## 2015-12-19 NOTE — Progress Notes (Addendum)
Patient ID: [name] [name], female   DOB: September 04, 1945, 70 y.o.   MRN: [id number]  PROGRESS NOTE    [name] [name]  [id number] DOB: [birth year] DOA: 12/17/2015  PCP: Lamar Blinks, MD   Brief Narrative:  70 y.o. female with past medical history significant for hypercoagulable disorder and history of PE on chronic Coumadin anticoagulation, hypothyroidism, Parkinson's disease with associated gait disturbance, hypothyroidism, fibromyalgia, history of prior right knee surgery further influencing gait disturbance. Patient reported 7 falls in the past 2 years. Most recently she was walking up the stairs and fell backwards and then had severe back and neck pain.  In ED, pt was hemodynamically stable. CT head and cervical spine showed diffuse cortical atrophy without acute intracranial abnormality seen, mild degenerative disc disease noted at C6-C7 with a moderately displaced fracture involving the posterior spinous process at C5. Shoulder x ray showed no fracture. CXR showed no active disease. X ray pelvis showed no fractures. Neck collar was placed and she was admitted for pain management.   Assessment & Plan:   Principal Problem:   Displaced fracture of fifth cervical vertebra (HCC) / Frequent falls  - Awaiting neurosurgery consultation  - Has neck collar - Per PT eval - CIR recommended  - Continue pain management efforts    Active Problems:   S/P right TKA - Stable    Hypercoagulable state ([place]) / History of pulmonary embolism - Continue coumadin - Pt requires ongoing daily INR check since her INR is 3.13 and goal is 2-3    Hypothyroidism - Continue synthroid    Parkinson's disease (HCC) - Continue sinemet    Essential hypertension - Continue hctz and Diovan  - Continue BP monitoring     Dyslipidemia - Continue Lipitor and omega 3     Acute encephalopathy - Slightly confused but otherwise no focal findings - CT head ordered    DVT prophylaxis: on Coumadin Code  Status:DNR/DNI Family Communication: no family at the bedside Disposition Plan: CIR consulted    Consultants:   Neurosurgery   Procedures:  None   Antimicrobials:   None   Subjective: Apparently little confused this am.   Objective: Vitals:   12/19/15 0549 12/19/15 0855 12/19/15 1334 12/19/15 1709  BP: (!) 156/80 139/79 122/66 (!) 142/81  Pulse: (!) 110 (!) 116 (!) 108 (!) 106  Resp: (!) 22 (!) 22 (!) 22 (!) 22  Temp: 98.6 F (37 C) 98 F (36.7 C) 98.3 F (36.8 C) 98 F (36.7 C)  TempSrc: Oral Oral Oral Oral  SpO2: 93% 93% 94% 96%  Weight:      Height:       No intake or output data in the 24 hours ending 12/19/15 1732 Filed Weights   12/17/15 0622  Weight: 88.5 kg (195 lb)    Examination:  General exam: has neck collar Respiratory system: No wheezing, no rhonchi  Cardiovascular system: S1 & S2 heard, rate controlled  Gastrointestinal system: (+) BS, non tender  Central nervous system: No focal neurological deficits. Extremities: No edema, palpable pulses  Skin: No rashes, lesions or ulcers, skin is warm and dry  Psychiatry: Mood & affect appropriate.   Data Reviewed: I have personally reviewed following labs and imaging studies  CBC:  Recent Labs Lab 12/17/15 0738 12/18/15 0316  WBC 17.4* 9.2  NEUTROABS 15.4*  --   HGB 14.0 13.4  HCT 42.4 40.4  MCV 88.1 87.4  PLT 258 [id number]   Basic Metabolic Panel:  Recent Labs Lab 12/17/15 0738 12/18/15 0316  NA 136 137  K 3.5 3.7  CL 100* 102  CO2 28 27  GLUCOSE 181* 132*  BUN 11 8  CREATININE 0.67 0.59  CALCIUM 9.2 9.2   GFR: Estimated Creatinine Clearance: 71.2 mL/min (by C-G formula based on SCr of 0.59 mg/dL). Liver Function Tests: No results for input(s): AST, ALT, ALKPHOS, BILITOT, PROT, ALBUMIN in the last 168 hours. No results for input(s): LIPASE, AMYLASE in the last 168 hours. No results for input(s): AMMONIA in the last 168 hours. Coagulation Profile:  Recent Labs Lab  12/15/15 1127 12/17/15 0738 12/18/15 0316 12/19/15 0219  INR 3.0 2.66 2.77 3.13   Cardiac Enzymes: No results for input(s): CKTOTAL, CKMB, CKMBINDEX, TROPONINI in the last 168 hours. BNP (last 3 results) No results for input(s): PROBNP in the last 8760 hours. HbA1C: No results for input(s): HGBA1C in the last 72 hours. CBG: No results for input(s): GLUCAP in the last 168 hours. Lipid Profile: No results for input(s): CHOL, HDL, LDLCALC, TRIG, CHOLHDL, LDLDIRECT in the last 72 hours. Thyroid Function Tests: No results for input(s): TSH, T4TOTAL, FREET4, T3FREE, THYROIDAB in the last 72 hours. Anemia Panel: No results for input(s): VITAMINB12, FOLATE, FERRITIN, TIBC, IRON, RETICCTPCT in the last 72 hours. Urine analysis:    Component Value Date/Time   COLORURINE AMBER (A) 12/17/2015 1052   APPEARANCEUR TURBID (A) 12/17/2015 1052   LABSPEC 1.017 12/17/2015 1052   PHURINE 7.5 12/17/2015 1052   GLUCOSEU NEGATIVE 12/17/2015 1052   HGBUR NEGATIVE 12/17/2015 [place] 12/17/2015 [place] 12/17/2015 1052   PROTEINUR NEGATIVE 12/17/2015 1052   UROBILINOGEN 0.2 09/07/2012 1049   NITRITE NEGATIVE 12/17/2015 1052   LEUKOCYTESUR TRACE (A) 12/17/2015 1052   Sepsis Labs: @LABRCNTIP (procalcitonin:4,lacticidven:4)   )No results found for this or any previous visit (from the past 240 hour(s)).    Radiology Studies: Dg Chest 1 View  Result Date: 12/17/2015 CLINICAL DATA:  Fall EXAM: CHEST 1 VIEW COMPARISON:  11/26/2010 FINDINGS: Borderline cardiomegaly. No acute infiltrate or pleural effusion. No pulmonary edema. Mild degenerative changes mid thoracic spine. IMPRESSION: No active disease. Electronically Signed   By: Lahoma Crocker M.D.   On: 12/17/2015 11:24   Dg Pelvis 1-2 Views  Result Date: 12/17/2015 CLINICAL DATA:  Fall with C5 fracture. EXAM: PELVIS - 1-2 VIEW COMPARISON:  Abdominal CT 09/07/2012 FINDINGS: Limited evaluation of the sacrum due to  bowel gas. No suspected fracture. No diastasis. Osteopenia. Lower lumbar facet arthropathy IMPRESSION: No acute finding. Electronically Signed   By: Monte Fantasia M.D.   On: 12/17/2015 11:18   Ct Head Wo Contrast  Result Date: 12/17/2015 CLINICAL DATA:  Posttraumatic headache and neck pain after fall off steps. EXAM: CT HEAD WITHOUT CONTRAST CT CERVICAL SPINE WITHOUT CONTRAST TECHNIQUE: Multidetector CT imaging of the head and cervical spine was performed following the standard protocol without intravenous contrast. Multiplanar CT image reconstructions of the cervical spine were also generated. COMPARISON:  CT scan of December 09, 2014. FINDINGS: CT HEAD FINDINGS Brain: Mild diffuse cortical atrophy is noted. Mild chronic ischemic white matter disease is noted. No mass effect or midline shift is noted. Ventricular size is within normal limits. There is no evidence of mass lesion, hemorrhage or acute infarction. Vascular: Atherosclerosis of internal carotid arteries is noted. Skull: Bony calvarium appears intact. Sinuses/Orbits: Visualized paranasal sinuses appear normal. Other: None. CT CERVICAL SPINE FINDINGS Alignment: Normal. Skull base and vertebrae: Moderately displaced fracture is seen involving the posterior spinous process of C5. No  other fracture is noted. Soft tissues and spinal canal: No definite soft tissue abnormality is noted. Disc levels: Mild degenerative disc disease is noted at C6-7 with anterior and posterior osteophyte formation. Fusion of the left-sided posterior facet joints is noted at C2-3, C3-4, and C4-5. Upper chest: Visualized lung fields appear normal. Other: Atherosclerosis of thoracic aorta is noted. IMPRESSION: Mild diffuse cortical atrophy. Mild chronic ischemic white matter disease. No acute intracranial abnormality seen. Mild degenerative disc disease is noted at C6-7. Other degenerative changes are noted. Moderately displaced fracture is seen involving the posterior spinous  process of C5. Critical Value/emergent results were called by telephone at the time of interpretation on 12/17/2015 at 9:08 am to Dr. Leo Grosser , who verbally acknowledged these results. Electronically Signed   By: Marijo Conception, M.D.   On: 12/17/2015 09:11   Ct Cervical Spine Wo Contrast  Result Date: 12/17/2015 CLINICAL DATA:  Posttraumatic headache and neck pain after fall off steps. EXAM: CT HEAD WITHOUT CONTRAST CT CERVICAL SPINE WITHOUT CONTRAST TECHNIQUE: Multidetector CT imaging of the head and cervical spine was performed following the standard protocol without intravenous contrast. Multiplanar CT image reconstructions of the cervical spine were also generated. COMPARISON:  CT scan of December 09, 2014. FINDINGS: CT HEAD FINDINGS Brain: Mild diffuse cortical atrophy is noted. Mild chronic ischemic white matter disease is noted. No mass effect or midline shift is noted. Ventricular size is within normal limits. There is no evidence of mass lesion, hemorrhage or acute infarction. Vascular: Atherosclerosis of internal carotid arteries is noted. Skull: Bony calvarium appears intact. Sinuses/Orbits: Visualized paranasal sinuses appear normal. Other: None. CT CERVICAL SPINE FINDINGS Alignment: Normal. Skull base and vertebrae: Moderately displaced fracture is seen involving the posterior spinous process of C5. No other fracture is noted. Soft tissues and spinal canal: No definite soft tissue abnormality is noted. Disc levels: Mild degenerative disc disease is noted at C6-7 with anterior and posterior osteophyte formation. Fusion of the left-sided posterior facet joints is noted at C2-3, C3-4, and C4-5. Upper chest: Visualized lung fields appear normal. Other: Atherosclerosis of thoracic aorta is noted. IMPRESSION: Mild diffuse cortical atrophy. Mild chronic ischemic white matter disease. No acute intracranial abnormality seen. Mild degenerative disc disease is noted at C6-7. Other degenerative changes are  noted. Moderately displaced fracture is seen involving the posterior spinous process of C5. Critical Value/emergent results were called by telephone at the time of interpretation on 12/17/2015 at 9:08 am to Dr. Leo Grosser , who verbally acknowledged these results. Electronically Signed   By: Marijo Conception, M.D.   On: 12/17/2015 09:11   Dg Shoulder Left  Result Date: 12/17/2015 CLINICAL DATA:  Status post fall down stairs this morning with a left shoulder injury. Pain. Initial encounter. EXAM: LEFT SHOULDER - 2+ VIEW COMPARISON:  None. FINDINGS: No acute bony or joint abnormality is identified. Mild to moderate acromioclavicular osteoarthritis is noted. Imaged left lung and ribs are unremarkable. IMPRESSION: No acute finding. Electronically Signed   By: Inge Rise M.D.   On: 12/17/2015 08:43        Scheduled Meds: . aspirin EC  81 mg Oral QPC breakfast  . atorvastatin  10 mg Oral Daily  . azelastine  1 spray Each Nare BID  . Carbidopa-Levodopa ER  1 tablet Oral TID  . cycloSPORINE  1 drop Both Eyes BID  . DULoxetine  60 mg Oral Daily  . valsartan  160 mg Oral Daily   And  . hydrochlorothiazide  12.5 mg Oral Daily  . levothyroxine  150 mcg Oral QAC breakfast  . magnesium oxide  200 mg Oral Daily  . multivitamin with minerals  1 tablet Oral Daily  . omega-3 acid ethyl esters  1 g Oral Daily  . polyethylene glycol  17 g Oral Daily  . Warfarin - Pharmacist Dosing Inpatient   Does not apply q1800  . zolpidem  5 mg Oral QHS   Continuous Infusions: . sodium chloride 10 mL/hr at 12/17/15 1830     LOS: 2 days    Time spent: 15 minutes  Greater than 50% of the time spent on counseling and coordinating the care.   Leisa Lenz, MD [facility] Pager [phone]  If 7PM-7AM, please contact night-coverage [email or web address] Password [email or web address] 12/19/2015, 5:32 PM

## 2015-12-19 NOTE — Progress Notes (Signed)
Pt has removed Aspen collar from neck several times, even though she was told not to due to neck fractures.  Pt still removing brace, NT putting back on again.

## 2015-12-20 ENCOUNTER — Encounter (HOSPITAL_COMMUNITY): Payer: Self-pay | Admitting: Nurse Practitioner

## 2015-12-20 ENCOUNTER — Inpatient Hospital Stay (HOSPITAL_COMMUNITY)
Admission: RE | Admit: 2015-12-20 | Discharge: 2016-01-01 | DRG: 560 | Disposition: A | Discharge status: Home Health | Payer: Medicare Other | Source: Intra-hospital | Attending: Physical Medicine & Rehabilitation | Admitting: Physical Medicine & Rehabilitation

## 2015-12-20 ENCOUNTER — Inpatient Hospital Stay (HOSPITAL_COMMUNITY): Payer: Medicare Other

## 2015-12-20 DIAGNOSIS — S12400A Unspecified displaced fracture of fifth cervical vertebra, initial encounter for closed fracture: Secondary | ICD-10-CM | POA: Diagnosis present

## 2015-12-20 DIAGNOSIS — Z96653 Presence of artificial knee joint, bilateral: Secondary | ICD-10-CM | POA: Diagnosis not present

## 2015-12-20 DIAGNOSIS — G4700 Insomnia, unspecified: Secondary | ICD-10-CM

## 2015-12-20 DIAGNOSIS — D6859 Other primary thrombophilia: Secondary | ICD-10-CM

## 2015-12-20 DIAGNOSIS — R296 Repeated falls: Secondary | ICD-10-CM

## 2015-12-20 DIAGNOSIS — Z86711 Personal history of pulmonary embolism: Secondary | ICD-10-CM | POA: Diagnosis not present

## 2015-12-20 DIAGNOSIS — E876 Hypokalemia: Secondary | ICD-10-CM

## 2015-12-20 DIAGNOSIS — W19XXXD Unspecified fall, subsequent encounter: Secondary | ICD-10-CM | POA: Diagnosis not present

## 2015-12-20 DIAGNOSIS — S12491S Other nondisplaced fracture of fifth cervical vertebra, sequela: Secondary | ICD-10-CM | POA: Diagnosis not present

## 2015-12-20 DIAGNOSIS — R5381 Other malaise: Secondary | ICD-10-CM

## 2015-12-20 DIAGNOSIS — S12400S Unspecified displaced fracture of fifth cervical vertebra, sequela: Secondary | ICD-10-CM

## 2015-12-20 DIAGNOSIS — S12401S Unspecified nondisplaced fracture of fifth cervical vertebra, sequela: Secondary | ICD-10-CM | POA: Diagnosis not present

## 2015-12-20 DIAGNOSIS — I739 Peripheral vascular disease, unspecified: Secondary | ICD-10-CM | POA: Diagnosis not present

## 2015-12-20 DIAGNOSIS — I10 Essential (primary) hypertension: Secondary | ICD-10-CM

## 2015-12-20 DIAGNOSIS — E039 Hypothyroidism, unspecified: Secondary | ICD-10-CM | POA: Diagnosis not present

## 2015-12-20 DIAGNOSIS — Z87891 Personal history of nicotine dependence: Secondary | ICD-10-CM | POA: Diagnosis not present

## 2015-12-20 DIAGNOSIS — E785 Hyperlipidemia, unspecified: Secondary | ICD-10-CM | POA: Diagnosis not present

## 2015-12-20 DIAGNOSIS — M545 Low back pain, unspecified: Secondary | ICD-10-CM | POA: Diagnosis present

## 2015-12-20 DIAGNOSIS — G8929 Other chronic pain: Secondary | ICD-10-CM | POA: Diagnosis not present

## 2015-12-20 DIAGNOSIS — S12490S Other displaced fracture of fifth cervical vertebra, sequela: Secondary | ICD-10-CM | POA: Diagnosis not present

## 2015-12-20 DIAGNOSIS — S12400D Unspecified displaced fracture of fifth cervical vertebra, subsequent encounter for fracture with routine healing: Principal | ICD-10-CM

## 2015-12-20 DIAGNOSIS — G20 Parkinson's disease: Secondary | ICD-10-CM | POA: Diagnosis not present

## 2015-12-20 DIAGNOSIS — F329 Major depressive disorder, single episode, unspecified: Secondary | ICD-10-CM

## 2015-12-20 DIAGNOSIS — Z79899 Other long term (current) drug therapy: Secondary | ICD-10-CM | POA: Diagnosis not present

## 2015-12-20 DIAGNOSIS — Z7901 Long term (current) use of anticoagulants: Secondary | ICD-10-CM | POA: Diagnosis not present

## 2015-12-20 DIAGNOSIS — G20C Parkinsonism, unspecified: Secondary | ICD-10-CM | POA: Diagnosis present

## 2015-12-20 DIAGNOSIS — M797 Fibromyalgia: Secondary | ICD-10-CM | POA: Diagnosis present

## 2015-12-20 LAB — PROTIME-INR
INR: 2.81
Prothrombin Time: 30.2 seconds — ABNORMAL HIGH (ref 11.4–15.2)

## 2015-12-20 MED ORDER — DULOXETINE HCL 60 MG PO CPEP
60.0000 mg | ORAL_CAPSULE | Freq: Every day | ORAL | Status: DC
  Administered 2015-12-21 – 2016-01-01 (×12): 60 mg via ORAL
  Filled 2015-12-20 (×12): qty 1

## 2015-12-20 MED ORDER — SENNA 8.6 MG PO TABS: 1.0000 | ORAL_TABLET | Freq: Every day | ORAL | 0 refills | Status: DC

## 2015-12-20 MED ORDER — OXYCODONE HCL 5 MG PO TABS
10.0000 mg | ORAL_TABLET | ORAL | Status: DC | PRN
  Administered 2015-12-21 (×2): 15 mg via ORAL
  Administered 2015-12-21 (×2): 10 mg via ORAL
  Administered 2015-12-22 (×2): 15 mg via ORAL
  Administered 2015-12-22: 10 mg via ORAL
  Administered 2015-12-22 – 2015-12-23 (×3): 15 mg via ORAL
  Administered 2015-12-24 – 2015-12-30 (×11): 10 mg via ORAL
  Administered 2015-12-30: 15 mg via ORAL
  Administered 2015-12-31: 10 mg via ORAL
  Administered 2016-01-01: 15 mg via ORAL
  Filled 2015-12-20: qty 2
  Filled 2015-12-20: qty 3
  Filled 2015-12-20 (×2): qty 2
  Filled 2015-12-20 (×2): qty 3
  Filled 2015-12-20 (×3): qty 2
  Filled 2015-12-20 (×2): qty 3
  Filled 2015-12-20 (×3): qty 2
  Filled 2015-12-20: qty 3
  Filled 2015-12-20 (×2): qty 2
  Filled 2015-12-20: qty 3
  Filled 2015-12-20: qty 2
  Filled 2015-12-20: qty 3
  Filled 2015-12-20 (×2): qty 2
  Filled 2015-12-20: qty 3
  Filled 2015-12-20 (×3): qty 2

## 2015-12-20 MED ORDER — ACETAMINOPHEN 650 MG RE SUPP: 650.0000 mg | Freq: Four times a day (QID) | RECTAL | Status: DC | PRN

## 2015-12-20 MED ORDER — ONDANSETRON HCL 4 MG/2ML IJ SOLN: 4.0000 mg | Freq: Four times a day (QID) | INTRAMUSCULAR | Status: DC | PRN

## 2015-12-20 MED ORDER — SENNA 8.6 MG PO TABS
1.0000 | ORAL_TABLET | Freq: Every day | ORAL | Status: DC
  Administered 2015-12-21 – 2015-12-27 (×7): 8.6 mg via ORAL
  Filled 2015-12-20 (×7): qty 1

## 2015-12-20 MED ORDER — ZOLPIDEM TARTRATE 5 MG PO TABS
5.0000 mg | ORAL_TABLET | Freq: Every day | ORAL | Status: DC
  Administered 2015-12-20 – 2015-12-31 (×12): 5 mg via ORAL
  Filled 2015-12-20 (×12): qty 1

## 2015-12-20 MED ORDER — CYCLOSPORINE 0.05 % OP EMUL
1.0000 [drp] | Freq: Two times a day (BID) | OPHTHALMIC | Status: DC
  Administered 2015-12-20 – 2016-01-01 (×24): 1 [drp] via OPHTHALMIC
  Filled 2015-12-20 (×24): qty 1

## 2015-12-20 MED ORDER — WARFARIN - PHARMACIST DOSING INPATIENT
Freq: Every day | Status: DC
Start: 2015-12-21 — End: 2016-01-01
  Administered 2015-12-30: 18:00:00

## 2015-12-20 MED ORDER — AZELASTINE HCL 0.1 % NA SOLN
1.0000 | Freq: Two times a day (BID) | NASAL | Status: DC
  Administered 2015-12-20 – 2016-01-01 (×21): 1 via NASAL
  Filled 2015-12-20 (×2): qty 30

## 2015-12-20 MED ORDER — ONDANSETRON HCL 4 MG PO TABS
4.0000 mg | ORAL_TABLET | Freq: Four times a day (QID) | ORAL | Status: DC | PRN
Start: 2015-12-20 — End: 2016-01-01
  Filled 2015-12-20: qty 1

## 2015-12-20 MED ORDER — CARBIDOPA-LEVODOPA ER 25-100 MG PO TBCR
1.0000 | EXTENDED_RELEASE_TABLET | Freq: Three times a day (TID) | ORAL | Status: DC
  Administered 2015-12-21 – 2016-01-01 (×34): 1 via ORAL
  Filled 2015-12-20 (×39): qty 1

## 2015-12-20 MED ORDER — POLYETHYLENE GLYCOL 3350 17 G PO PACK: 17.0000 g | PACK | Freq: Every day | ORAL | Status: DC

## 2015-12-20 MED ORDER — ACETAMINOPHEN 325 MG PO TABS: 650.0000 mg | ORAL_TABLET | Freq: Four times a day (QID) | ORAL | Status: DC | PRN

## 2015-12-20 MED ORDER — WARFARIN SODIUM 3 MG PO TABS: 3.0000 mg | ORAL_TABLET | Freq: Once | ORAL | 0 refills | Status: DC

## 2015-12-20 MED ORDER — VALSARTAN 160 MG PO TABS
160.0000 mg | ORAL_TABLET | Freq: Every day | ORAL | Status: DC
  Administered 2015-12-21 – 2016-01-01 (×12): 160 mg via ORAL
  Filled 2015-12-20 (×12): qty 1

## 2015-12-20 MED ORDER — CELECOXIB 100 MG PO CAPS
100.0000 mg | ORAL_CAPSULE | Freq: Two times a day (BID) | ORAL | Status: DC
  Administered 2015-12-20 – 2016-01-01 (×24): 100 mg via ORAL
  Filled 2015-12-20 (×24): qty 1

## 2015-12-20 MED ORDER — ATORVASTATIN CALCIUM 10 MG PO TABS
10.0000 mg | ORAL_TABLET | Freq: Every day | ORAL | Status: DC
  Administered 2015-12-21 – 2016-01-01 (×12): 10 mg via ORAL
  Filled 2015-12-20 (×12): qty 1

## 2015-12-20 MED ORDER — BISACODYL 10 MG RE SUPP
10.0000 mg | Freq: Once | RECTAL | Status: AC
  Administered 2015-12-20: 10 mg via RECTAL
  Filled 2015-12-20: qty 1

## 2015-12-20 MED ORDER — MAGNESIUM OXIDE 400 (241.3 MG) MG PO TABS
200.0000 mg | ORAL_TABLET | Freq: Every day | ORAL | Status: DC
  Administered 2015-12-21 – 2016-01-01 (×12): 200 mg via ORAL
  Filled 2015-12-20 (×12): qty 1

## 2015-12-20 MED ORDER — SENNA 8.6 MG PO TABS
1.0000 | ORAL_TABLET | Freq: Every day | ORAL | Status: DC
  Administered 2015-12-20: 8.6 mg via ORAL
  Filled 2015-12-20: qty 1

## 2015-12-20 MED ORDER — HYDROMORPHONE HCL 1 MG/ML IJ SOLN: 1.0000 mg | INTRAMUSCULAR | Status: DC | PRN

## 2015-12-20 MED ORDER — SORBITOL 70 % SOLN
30.0000 mL | Freq: Every day | Status: DC | PRN
  Administered 2015-12-23 – 2015-12-30 (×4): 30 mL via ORAL
  Filled 2015-12-20 (×4): qty 30

## 2015-12-20 MED ORDER — OXYCODONE HCL 5 MG PO TABS: 5.0000 mg | ORAL_TABLET | ORAL | Status: DC | PRN

## 2015-12-20 MED ORDER — OMEGA-3-ACID ETHYL ESTERS 1 G PO CAPS
1.0000 g | ORAL_CAPSULE | Freq: Every day | ORAL | Status: DC
  Administered 2015-12-21 – 2016-01-01 (×12): 1 g via ORAL
  Filled 2015-12-20 (×12): qty 1

## 2015-12-20 MED ORDER — ASPIRIN EC 81 MG PO TBEC
81.0000 mg | DELAYED_RELEASE_TABLET | Freq: Every day | ORAL | Status: DC
  Administered 2015-12-21 – 2016-01-01 (×12): 81 mg via ORAL
  Filled 2015-12-20 (×12): qty 1

## 2015-12-20 MED ORDER — LEVOTHYROXINE SODIUM 75 MCG PO TABS
150.0000 ug | ORAL_TABLET | Freq: Every day | ORAL | Status: DC
  Administered 2015-12-21 – 2016-01-01 (×12): 150 ug via ORAL
  Filled 2015-12-20 (×12): qty 2

## 2015-12-20 MED ORDER — WARFARIN SODIUM 3 MG PO TABS
3.0000 mg | ORAL_TABLET | Freq: Once | ORAL | Status: AC
Start: 2015-12-20 — End: 2015-12-20
  Administered 2015-12-20: 3 mg via ORAL
  Filled 2015-12-20: qty 1

## 2015-12-20 MED ORDER — WARFARIN SODIUM 3 MG PO TABS
3.0000 mg | ORAL_TABLET | Freq: Once | ORAL | Status: DC
  Filled 2015-12-20: qty 1

## 2015-12-20 MED ORDER — HYDROCHLOROTHIAZIDE 12.5 MG PO CAPS
12.5000 mg | ORAL_CAPSULE | Freq: Every day | ORAL | Status: DC
  Administered 2015-12-21 – 2016-01-01 (×12): 12.5 mg via ORAL
  Filled 2015-12-20 (×12): qty 1

## 2015-12-20 MED ORDER — ADULT MULTIVITAMIN W/MINERALS CH
1.0000 | ORAL_TABLET | Freq: Every day | ORAL | Status: DC
  Administered 2015-12-21 – 2016-01-01 (×12): 1 via ORAL
  Filled 2015-12-20 (×12): qty 1

## 2015-12-20 MED ORDER — HYDROMORPHONE HCL 1 MG/ML IJ SOLN: 1.0000 mg | INTRAMUSCULAR | 0 refills | Status: DC | PRN

## 2015-12-20 MED ORDER — MAGNESIUM OXIDE 400 (241.3 MG) MG PO TABS: 200.0000 mg | ORAL_TABLET | Freq: Every day | ORAL | 0 refills | Status: DC

## 2015-12-20 NOTE — H&P (View-Only) (Signed)
Physical Medicine and Rehabilitation Admission H&P    Chief Complaint  Patient presents with  . Fall  . Neck Injury  . Shoulder Pain  : HPI: [name] [name] is a 70 y.o. right handed female with history of fibromyalgia, hypercoagulable disorder and history of pulmonary emboli on chronic Coumadin, Parkinson's disease with gait disturbance followed by Dr. [name] at [facility] neurological. Per chart review patient lives alone. Multilevel with bedroom on main floor. Patient used a rolling walker prior to admission. She has a son and daughter in the area question assist. son-in-law is a local occupational therapist.  Patient with reported 7 falls in the past 2 years. Presented 12/17/2015 after a fall with significant neck pain radiating to the shoulders. Cranial CT negative for acute changes. CT cervical spine showed moderately displaced fractures involving the posterior spinous process of C5. Conservative care recommended placed in a cervical collar. Hospital course pain management.Intermittent bouts of confusion cranial CT scan 2 negative for acute changes. Chronic Coumadin ongoing for pulmonary emboli. Physical and occupational therapy evaluations completed 12/19/2015 with recommendations of physical medicine rehabilitation consult.Patient was admitted for a comprehensive rehabilitation program  ROS Constitutional: Negative for chills and fever.  HENT: Negative for hearing loss.   Eyes: Positive for blurred vision.  Respiratory: Negative for cough and shortness of breath.   Cardiovascular: Positive for palpitations. Negative for chest pain and leg swelling.  Gastrointestinal: Positive for constipation. Negative for nausea and vomiting.  Genitourinary: Negative for dysuria and hematuria.  Musculoskeletal: Positive for falls.  Skin: Negative for rash.  Neurological: Positive for dizziness and headaches. Negative for seizures and loss of consciousness.  Psychiatric/Behavioral: The patient has  insomnia.   All other systems reviewed and are negative    Past Medical History:  Diagnosis Date  . Arthritis   . Chronic insomnia   . Complication of anesthesia    severe itching night of surgery requiring meds- also couldnt swallow- throat "paralyzed""  . Fibromyalgia   . Glaucoma   . Hypercholesterolemia   . Hyperlipidemia   . Hypertension    PCP Dr Cari Caraway- [place]  05/15/11 with clearance and note on chart,    chest x ray, EKG 12/12 EPIC, eccho 7/11 EPIC  . Hypothyroidism    s/p graves disease  . Kidney stone   . PD (Parkinson's disease) ([place])   . Peripheral vascular disease ([place])    PULMONARY EMBOLUS x 2- 2011, 2012/ FOLLOWED BY DR ODOGWU-LOV 12/12 EPIC   PT STAES WILL STOP COUMADIN 3/12 and begin LOVONOX 05/17/11 as previously instructed  . Sinus infection    at present- dr Honor Loh PA aware- was seen there and at PCP 05/10/11  . Thyroid disease    Hypothyroidism   Past Surgical History:  Procedure Laterality Date  . ABDOMINAL HYSTERECTOMY    . APPENDECTOMY    . CATARACT EXTRACTION Bilateral   . CHOLECYSTECTOMY    . COLONOSCOPY    . EXPLORATORY LAPAROTOMY    . JOINT REPLACEMENT     left knee  7/12  . TOTAL KNEE ARTHROPLASTY  05/22/2011   Procedure: TOTAL KNEE ARTHROPLASTY;  Surgeon: Mauri Pole, MD;  Location: [facility];  Service: Orthopedics;  Laterality: Right;   Family History  Problem Relation Age of Onset  . Prostate cancer Father   . Stroke Father    Social History:  reports that she quit smoking about 26 years ago. She smoked 1.00 pack per day. She has never used smokeless tobacco. She reports that  she drinks alcohol. She reports that she does not use drugs. Allergies:  Allergies  Allergen Reactions  . Crestor [Rosuvastatin Calcium] Other (See Comments)    Feeling very bad, aching all over.  . Erythromycin Hives  . Pregabalin Other (See Comments)    Crossed vision.  Marland Kitchen Macrobid [Nitrofurantoin] Hives  . Carbidopa     Headaches, nausea, dizziness  .  Gabapentin Other (See Comments)    Blurred vision  . Losartan    Medications Prior to Admission  Medication Sig Dispense Refill  . acetaminophen (TYLENOL) 500 MG tablet Take 500 mg by mouth every 6 (six) hours as needed for mild pain.    Marland Kitchen aspirin 81 MG tablet Take 81 mg by mouth daily after breakfast.     . atorvastatin (LIPITOR) 10 MG tablet Take 10 mg by mouth daily.    Marland Kitchen azelastine (ASTELIN) 0.1 % nasal spray Place 1 spray into both nostrils 2 (two) times daily. Use in each nostril as directed    . Calcium Carbonate-Vitamin D (CALCIUM + D PO) Take 2 tablets by mouth at bedtime. 600-200 mg tab.    . Carbidopa-Levodopa ER (RYTARY) 23.75-95 MG CPCR Take 1 tablet by mouth 3 (three) times daily. 100 capsule 0  . Cholecalciferol (VITAMIN D3) 5000 UNITS CAPS Take 5,000 Units by mouth daily.     . cycloSPORINE (RESTASIS) 0.05 % ophthalmic emulsion Place 1 drop into both eyes daily as needed.    . DULoxetine (CYMBALTA) 60 MG capsule Take 1 capsule (60 mg total) by mouth daily. 30 capsule 5  . Magnesium 250 MG TABS Take 1 tablet by mouth at bedtime.     . Multiple Vitamin (MULTIVITAMIN) tablet Take 1 tablet by mouth daily.     . Omega-3 Fatty Acids (FISH OIL) 1200 MG CAPS Take 2 capsules by mouth at bedtime.     Marland Kitchen SYNTHROID 150 MCG tablet Take 150 mcg by mouth daily before breakfast.     . telmisartan-hydrochlorothiazide (MICARDIS HCT) 40-12.5 MG per tablet Take 1 tablet by mouth daily.   1  . vitamin B-12 (CYANOCOBALAMIN) 1000 MCG tablet Take 1,000 mcg by mouth daily.    Marland Kitchen warfarin (COUMADIN) 4 MG tablet Take 4 mg by mouth daily.    Marland Kitchen zolpidem (AMBIEN) 10 MG tablet Take 1 tablet (10 mg total) by mouth at bedtime. 30 tablet 2  . DULoxetine (CYMBALTA) 60 MG capsule Take 1 capsule (60 mg total) by mouth daily. (Patient not taking: Reported on 12/17/2015) 90 capsule 0    Home: Home Living Family/patient expects to be discharged to:: Skilled nursing facility Living Arrangements: Alone Available  Help at Discharge: Family, Available PRN/intermittently Type of Home: House Home Access: Stairs to enter [employer] of Steps: "several" Entrance Stairs-Rails: Right, Left, Can reach both Home Layout: Multi-level, Bed/bath upstairs, Able to live on main level with bedroom/bathroom Alternate Level Stairs-Number of Steps: 13 Alternate Level Stairs-Rails: Left Bathroom Toilet: Standard Home Equipment: Bedside commode, Walker - 4 wheels, Walker - 2 wheels Additional Comments: Pt has rollator on main level and RW upstairs.   Functional History: Prior Function Level of Independence: Independent with assistive device(s) Comments: Pt still drives. Ambulates in the community with rollator.  Functional Status:  Mobility: Bed Mobility Overal bed mobility: Needs Assistance Bed Mobility: Supine to Sit Rolling: Mod assist Supine to sit: HOB elevated, Max assist General bed mobility comments: Pt up in recliner. Transfers Overall transfer level: Needs assistance Equipment used: Rolling walker (2 wheeled) Transfers: Sit to/from Stand  Sit to Stand: Max assist Stand pivot transfers: Mod assist General transfer comment: verbal cues for hand placement, assist with anterior weight shift to power up. Pt retropulsive with stance. Mod assist required to maintain standing. Pt able to stand x 3 trials. Ambulation/Gait General Gait Details: unable to progress LE for gait    ADL: ADL Overall ADL's : Needs assistance/impaired Eating/Feeding: Moderate assistance, Bed level Grooming: Wash/dry face, Wash/dry hands, Set up, Supervision/safety (sitting in recliner) Upper Body Bathing: Moderate assistance, Sitting (in recliner) Lower Body Bathing: Total assistance (mod A sit<>stand with 2nd person to help wash) Upper Body Dressing : Total assistance (sitting in recliner) Lower Body Dressing: Total assistance (mod A sit<>stand with 2nd person to help with clothes) Toilet Transfer: Moderate  assistance, Stand-pivot, BSC (with extreme increased time and A for weight shifts) Toileting- Clothing Manipulation and Hygiene: Total assistance (mod A sit<>stand with 2nd person to help wash and to A with clothing)  Cognition: Cognition Overall Cognitive Status: Impaired/Different from baseline Orientation Level: Oriented X4 Cognition Arousal/Alertness: Awake/alert Behavior During Therapy: WFL for tasks assessed/performed Overall Cognitive Status: Impaired/Different from baseline Area of Impairment: Orientation, Attention, Memory, Following commands, Safety/judgement, Problem solving Orientation Level: Disoriented to, Situation, Time Current Attention Level: Selective Memory: Decreased recall of precautions, Decreased short-term memory Following Commands: Follows one step commands with increased time, Follows multi-step commands inconsistently Safety/Judgement: Decreased awareness of safety, Decreased awareness of deficits Problem Solving: Slow processing, Decreased initiation, Difficulty sequencing, Requires verbal cues, Requires tactile cues  Physical Exam: Blood pressure (!) 126/54, pulse 77, temperature 97.9 F (36.6 C), temperature source Oral, resp. rate 20, height 5\' 6"  (1.676 m), weight 88.5 kg (195 lb), SpO2 96 %. Physical Exam HENT:  Head: Normocephalic.  Eyes: EOM are normal.  Cardiovascular: Normal rate and regular rhythm.   Respiratory: Effort normal and breath sounds normal. No respiratory distress.  GI: Soft. Bowel sounds are normal. She exhibits no distension.  Neurological:  Patient's eyes are open. Makes eye contact. Does not initiate conversation. Does answer when cued. Moves all 4's without sign of focal weakness. Do not see focal sensory loss either. Processing extremely delayed. Limited awareness  Skin: Skin is warm and dry.  Psychiatric:  Flat, disengaged    Results for orders placed or performed during the hospital encounter of 12/17/15 (from the past 48  hour(s))  Protime-INR     Status: Abnormal   Collection Time: 12/19/15  2:19 AM  Result Value Ref Range   Prothrombin Time 32.9 (H) 11.4 - 15.2 seconds   INR 3.13   Protime-INR     Status: Abnormal   Collection Time: 12/20/15  7:37 AM  Result Value Ref Range   Prothrombin Time 30.2 (H) 11.4 - 15.2 seconds   INR 2.81    Ct Head Wo Contrast  Result Date: 12/20/2015 CLINICAL DATA:  Fall earlier today.  Parkinson's disease. EXAM: CT HEAD WITHOUT CONTRAST TECHNIQUE: Contiguous axial images were obtained from the base of the skull through the vertex without intravenous contrast. COMPARISON:  December 17, 2015 FINDINGS: Brain: Mild diffuse atrophy remain stable. There is a cavum septum pellucidum, an anatomic variant. There is no intracranial mass, hemorrhage, extra-axial fluid collection, or midline shift. There is patchy small vessel disease in the centra semiovale bilaterally, stable. There is no new gray-white compartment lesion. No acute infarct is evident. Vascular: No hyperdense vessels are evident. There is calcification in both carotid siphon regions, somewhat more on the right than on the left. Skull: The bony calvarium appears  intact. Sinuses/Orbits: There is mucosal thickening in multiple ethmoid air cells bilaterally. There is slight mucosal thickening in the inferior right frontal sinus. Mild mucosal thickening is noted in the visualized right maxillary antrum. Other visualized paranasal sinuses are clear. There is slight rightward deviation of the nasal septum. Orbits appear symmetric bilaterally. Other: Mastoid air cells are clear. IMPRESSION: Stable atrophy with patchy periventricular small vessel disease. No acute infarct. No intracranial mass, hemorrhage, or extra-axial fluid collection. Foci of arterial vascular calcification noted. Areas of paranasal sinus disease noted. Electronically Signed   By: Lowella Grip III M.D.   On: 12/20/2015 07:11       Medical Problem List and  Plan: 1.  Debilitation secondary to C5 fracture after fall as well as history of Parkinson's disease. Cervical collar at all times  -admit to inpatient rehab 2.  DVT Prophylaxis/Anticoagulation: Chronic Coumadin for history of pulmonary emboli/hypercoagulable state. Monitor for any bleeding episodes 3. Pain Management: Oxycodone as needed 4. Mood: Cymbalta 60 mg daily 5. Neuropsych: This patient is capable of making decisions on her own behalf. 6. Skin/Wound Care: Routine skin checks 7. Fluids/Electrolytes/Nutrition: Routine I&O with follow-up chemistries 8. Parkinson disease. Sinemet 25-100 3 times a day. Followed by Dr. Adline Peals neurological 9. Hypertension. HCTZ 12.5 mg daily, Diovan 160 mg daily. Monitor with increased mobility2 10. Hypothyroidism. Synthroid. TSH 0.61 11. Hyperlipidemia. Lipitor, Lovaza  Post Admission Physician Evaluation: 1. Functional deficits secondary  to C5 fracture. 2. Patient is admitted to receive collaborative, interdisciplinary care between the physiatrist, rehab nursing staff, and therapy team. 3. Patient's level of medical complexity and substantial therapy needs in context of that medical necessity cannot be provided at a lesser intensity of care such as a SNF. 4. Patient has experienced substantial functional loss from his/her baseline which was documented above under the "Functional History" and "Functional Status" headings.  Judging by the patient's diagnosis, physical exam, and functional history, the patient has potential for functional progress which will result in measurable gains while on inpatient rehab.  These gains will be of substantial and practical use upon discharge  in facilitating mobility and self-care at the household level. 5. Physiatrist will provide 24 hour management of medical needs as well as oversight of the therapy plan/treatment and provide guidance as appropriate regarding the interaction of the two. 6. 24 hour rehab nursing  will assist with bladder management, bowel management, safety, skin/wound care, disease management, medication administration, pain management and patient education  and help integrate therapy concepts, techniques,education, etc. 7. PT will assess and treat for/with: Lower extremity strength, range of motion, stamina, balance, functional mobility, safety, adaptive techniques and equipment, NMR, balance, w/c use, ego support.   Goals are: supervision to min assist. 8. OT will assess and treat for/with: ADL's, functional mobility, safety, upper extremity strength, adaptive techniques and equipment. NMR, ego support, family education, community reintegration.   Goals are: supervision to min assist. Therapy may proceed with showering this patient. 9. SLP will assess and treat for/with: n/.  Goals are: n/a---pt ?near baseline?---monitor clinically. 10. Case Management and Social Worker will assess and treat for psychological issues and discharge planning. 11. Team conference will be held weekly to assess progress toward goals and to determine barriers to discharge. 12. Patient will receive at least 3 hours of therapy per day at least 5 days per week. 13. ELOS: 12-18 days       14. Prognosis:  good     [name], MD, [facility] 12/20/2015  12/20/2015

## 2015-12-20 NOTE — Clinical Social Work Note (Signed)
CSW consulted for New SNF as a back up for CIR. CSW was informed that pt will go to CIR today. CSW will sign off as no further needs identified.   Darden Dates, MSW, LCSW  Clinical Social Worker  [phone]

## 2015-12-20 NOTE — Progress Notes (Signed)
ANTICOAGULATION CONSULT NOTE - Follow up [place] for warfarin Indication: history of PE  Allergies  Allergen Reactions  . Crestor [Rosuvastatin Calcium] Other (See Comments)    Feeling very bad, aching all over.  . Erythromycin Hives  . Pregabalin Other (See Comments)    Crossed vision.  Marland Kitchen Macrobid [Nitrofurantoin] Hives  . Carbidopa     Headaches, nausea, dizziness  . Gabapentin Other (See Comments)    Blurred vision  . Losartan     Patient Measurements: Height: 5\' 6"  (167.6 cm) Weight: 195 lb (88.5 kg) IBW/kg (Calculated) : 59.3   Vital Signs: Temp: 97.9 F (36.6 C) (10/16 0436) Temp Source: Oral (10/16 0436) BP: 126/54 (10/16 0436) Pulse Rate: 77 (10/16 0436)  Labs:  Recent Labs  12/18/15 0316 12/19/15 0219 12/20/15 0737  HGB 13.4  --   --   HCT 40.4  --   --   PLT 264  --   --   LABPROT 29.9* 32.9* 30.2*  INR 2.77 3.13 2.81  CREATININE 0.59  --   --     Estimated Creatinine Clearance: 71.2 mL/min (by C-G formula based on SCr of 0.59 mg/dL).   Medical History: Past Medical History:  Diagnosis Date  . Arthritis   . Chronic insomnia   . Complication of anesthesia    severe itching night of surgery requiring meds- also couldnt swallow- throat "paralyzed""  . Fibromyalgia   . Glaucoma   . Hypercholesterolemia   . Hyperlipidemia   . Hypertension    PCP Dr Cari Caraway- [place]  05/15/11 with clearance and note on chart,    chest x ray, EKG 12/12 EPIC, eccho 7/11 EPIC  . Hypothyroidism    s/p graves disease  . Kidney stone   . PD (Parkinson's disease) ([place])   . Peripheral vascular disease ([place])    PULMONARY EMBOLUS x 2- 2011, 2012/ FOLLOWED BY DR ODOGWU-LOV 12/12 EPIC   PT STAES WILL STOP COUMADIN 3/12 and begin LOVONOX 05/17/11 as previously instructed  . Sinus infection    at present- dr Honor Loh PA aware- was seen there and at PCP 05/10/11  . Thyroid disease    Hypothyroidism    Assessment: Pt admitted s/p fall. No imaging evidence  of bleeding. No bleeding reported. Pt takes PTA warfarin for hypercoaguable state. PTA dose is 4mg  daily. INR 10/11 in clinic was 3.0.  INR down this AM to 2.81 after dose reduction last night. Per nursing on 10/15, patient has been eating 25-30% of meals. H/H and Plt WNL as of 10/14. No bleeding noted.   Goal of Therapy:  INR 2-3 Monitor platelets by anticoagulation protocol: Yes   Plan:  Warfarin 3 mg PO x1 Daily INR Monitor CBC, s/sx bleeding, PO intake   Elicia Lamp, PharmD, BCPS Clinical Pharmacist 12/20/2015 10:08 AM

## 2015-12-20 NOTE — PMR Pre-admission (Signed)
PMR Admission Coordinator Pre-Admission Assessment  Patient: [name] [name] is an 70 y.o., female MRN: [id number] DOB: 1945/06/28 Height: 5\' 6"  (167.6 cm) Weight: 88.5 kg (195 lb)              Insurance Information HMO:     PPO:      PCP:      IPA:      80/20:      OTHER:  PRIMARY:  Medicare A and B       Policy#:  [id number]      Subscriber:  self CM Name:        Phone#:       Fax#:   Pre-Cert#:        Employer:  Full time employment with [employer] Benefits:  Phone #:      Name:  Eff. Date:  Part A 03/06/08; Part B 08/04/13     Deduct:  $1316      Out of Pocket Max:  n/a      Life Max:  n/a CIR:  100%      SNF:  100% first 20 days Outpatient:  80%     Co-Pay: 20% Home Health:  100%      Co-Pay:  DME:  80%     Co-Pay:  20% Providers:  Pt. choice SECONDARY:  [employer]      Policy#:  [id number]      Subscriber:  self CM Name:        Phone#:      Fax#:  Pre-Cert#:       Employer:  Benefits:  Phone #:      Name:  Eff. Date:      Deduct:       Out of Pocket Max:       Life Max:  CIR:       SNF:  Outpatient:      Co-Pay:  Home Health:       Co-Pay:  DME:      Co-Pay:   Medicaid Application Date:       Case Manager:  Disability Application Date:       Case Worker:   Emergency Contact Information Contact Information    Name Relation Home Work [place] Daughter [phone]  [phone]   [place], [place] [phone]       Current Medical History  Patient Admitting Diagnosis: 70 yo female with Parkinson's disease s/p C5 Fracture after fall History of Present Illness: [name] [name] a 70 y.o.right handed femalewith history of fibromyalgia, hypercoagulable disorder and history of pulmonary emboli on chronic Coumadin, Parkinson's disease with gait disturbancefollowed by Dr. [name] at [facility]. Per chart review patient lives alone. Multilevel with bedroom on main floor. Patient used a rolling walker prior to admission.She has a son and daughter in the  area question assist. son-in-law is a local occupational therapist. Patient with reported 7 falls in the past 2 years. Presented 12/17/2015 after a fall with significant neck pain radiating to the shoulders. Cranial CT negative for acute changes. CT cervical spine showed moderately displaced fractures involving the posterior spinous process of C5. Conservative care recommended placed in a cervical collar. Hospital course pain management.Intermittent bouts of confusion cranial CT scan 2 negative for acute changes. Chronic Coumadin ongoing for pulmonary emboli. Physical and occupational therapy evaluations completed 12/19/2015 with recommendations of physical medicine rehabilitation consult.Patient was admitted for a comprehensive rehabilitation program  NIH Total: 1    Past Medical History  Past Medical History:  Diagnosis Date  . Arthritis   . Chronic insomnia   . Complication of anesthesia    severe itching night of surgery requiring meds- also couldnt swallow- throat "paralyzed""  . Fibromyalgia   . Glaucoma   . Hypercholesterolemia   . Hyperlipidemia   . Hypertension    PCP Dr Cari Caraway- [place]  05/15/11 with clearance and note on chart,    chest x ray, EKG 12/12 EPIC, eccho 7/11 EPIC  . Hypothyroidism    s/p graves disease  . Kidney stone   . PD (Parkinson's disease) ([place])   . Peripheral vascular disease ([place])    PULMONARY EMBOLUS x 2- 2011, 2012/ FOLLOWED BY DR ODOGWU-LOV 12/12 EPIC   PT STAES WILL STOP COUMADIN 3/12 and begin LOVONOX 05/17/11 as previously instructed  . Sinus infection    at present- dr Honor Loh PA aware- was seen there and at PCP 05/10/11  . Thyroid disease    Hypothyroidism    Family History  family history includes Prostate cancer in her father; Stroke in her father.  Prior Rehab/Hospitalizations:  Has the patient had major surgery during 100 days prior to admission? No  Current Medications   Current Facility-Administered Medications:  .  0.9 %  sodium  chloride infusion, , Intravenous, Continuous, Samella Parr, NP, Last Rate: 10 mL/hr at 12/17/15 1830 .  acetaminophen (TYLENOL) tablet 650 mg, 650 mg, Oral, Q6H PRN **OR** acetaminophen (TYLENOL) suppository 650 mg, 650 mg, Rectal, Q6H PRN, Samella Parr, NP .  aspirin EC tablet 81 mg, 81 mg, Oral, QPC breakfast, Samella Parr, NP, 81 mg at 12/20/15 0926 .  atorvastatin (LIPITOR) tablet 10 mg, 10 mg, Oral, Daily, Samella Parr, NP, 10 mg at 12/20/15 0926 .  azelastine (ASTELIN) 0.1 % nasal spray 1 spray, 1 spray, Each Nare, BID, Samella Parr, NP, 1 spray at 12/20/15 0926 .  bisacodyl (DULCOLAX) suppository 10 mg, 10 mg, Rectal, Once, Robbie Lis, MD .  Carbidopa-Levodopa ER (SINEMET CR) 25-100 MG tablet controlled release 1 tablet, 1 tablet, Oral, TID, Elwin Mocha, MD, 1 tablet at 12/20/15 0840 .  cycloSPORINE (RESTASIS) 0.05 % ophthalmic emulsion 1 drop, 1 drop, Both Eyes, BID, Samella Parr, NP, 1 drop at 12/20/15 0927 .  DULoxetine (CYMBALTA) DR capsule 60 mg, 60 mg, Oral, Daily, Samella Parr, NP, 60 mg at 12/20/15 0927 .  valsartan (DIOVAN) 160 MG tablet 160 mg, 160 mg, Oral, Daily, 160 mg at 12/20/15 0928 **AND** hydrochlorothiazide (MICROZIDE) capsule 12.5 mg, 12.5 mg, Oral, Daily, Robbie Lis, MD, 12.5 mg at 12/20/15 0929 .  HYDROmorphone (DILAUDID) injection 1 mg, 1 mg, Subcutaneous, Q4H PRN, Robbie Lis, MD .  levothyroxine (SYNTHROID, LEVOTHROID) tablet 150 mcg, 150 mcg, Oral, QAC breakfast, Samella Parr, NP, 150 mcg at 12/20/15 0840 .  magnesium oxide (MAG-OX) tablet 200 mg, 200 mg, Oral, Daily, Elwin Mocha, MD, 200 mg at 12/20/15 [id number] .  multivitamin with minerals tablet 1 tablet, 1 tablet, Oral, Daily, Samella Parr, NP, 1 tablet at 12/20/15 [id number] .  omega-3 acid ethyl esters (LOVAZA) capsule 1 g, 1 g, Oral, Daily, Samella Parr, NP, 1 g at 12/20/15 0928 .  oxyCODONE (Oxy IR/ROXICODONE) immediate release tablet 5-10 mg, 5-10 mg, Oral, Q4H PRN, Samella Parr, NP, 10 mg at 12/20/15 0847 .  senna (SENOKOT) tablet 8.6 mg, 1 tablet, Oral, Daily, Robbie Lis, MD .  warfarin (COUMADIN) tablet 3 mg, 3 mg, Oral, ONCE-1800, Hildred Alamin  Henderson Newcomer, RPH .  Warfarin - Pharmacist Dosing Inpatient, , Does not apply, q1800, Valeda Malm Rumbarger, RPH .  zolpidem (AMBIEN) tablet 5 mg, 5 mg, Oral, QHS, Samella Parr, NP, 5 mg at 12/19/15 2143  Patients Current Diet: Diet Heart Room service appropriate? Yes; Fluid consistency: Thin Diet - low sodium heart healthy  Precautions / Restrictions Precautions Precautions: Fall, Cervical Cervical Brace: Hard collar, At all times Restrictions Weight Bearing Restrictions: No   Has the patient had 2 or more falls or a fall with injury in the past year?Yes; pt. fell  October, 2016 with a trip to ED but no serious injury  Prior Activity Level Limited Community (1-2x/wk): Per daughter [name], pt. is out of the home 2-3 times per week visiting the grandchildren, going to MD appointments and grocery shopping/errands  [place] / [place] Devices/Equipment: Eyeglasses Home Equipment: Bedside commode, Environmental consultant - 4 wheels, Environmental consultant - 2 wheels  Prior Device Use: Indicate devices/aids used by the patient prior to current illness, exacerbation or injury? Walker  Prior Functional Level Prior Function Level of Independence: Independent with assistive device(s) Comments: Pt still drives. Ambulates in the community with rollator.  Self Care: Did the patient need help bathing, dressing, using the toilet or eating?  Independent  Indoor Mobility: Did the patient need assistance with walking from room to room (with or without device)? Independent  Stairs: Did the patient need assistance with internal or external stairs (with or without device)? Independent  Functional Cognition: Did the patient need help planning regular tasks such as shopping or remembering to take medications? Independent  Current  Functional Level Cognition  Overall Cognitive Status: Impaired/Different from baseline Current Attention Level: Selective Orientation Level: Oriented X4 Following Commands: Follows one step commands with increased time, Follows multi-step commands inconsistently Safety/Judgement: Decreased awareness of safety General Comments: Pt appears unwilling to interact verbally during sessions making it difficult to fully assess her cognition.     Extremity Assessment (includes Sensation/Coordination)  Upper Extremity Assessment: Generalized weakness  Lower Extremity Assessment: Generalized weakness    ADLs  Overall ADL's : Needs assistance/impaired Eating/Feeding: Moderate assistance, Bed level Grooming: Wash/dry face, Wash/dry hands, Set up, Supervision/safety (sitting in recliner) Upper Body Bathing: Moderate assistance, Sitting (in recliner) Lower Body Bathing: Total assistance (mod A sit<>stand with 2nd person to help wash) Upper Body Dressing : Total assistance (sitting in recliner) Lower Body Dressing: Total assistance (mod A sit<>stand with 2nd person to help with clothes) Toilet Transfer: Moderate assistance, Stand-pivot, BSC (with extreme increased time and A for weight shifts) Toileting- Clothing Manipulation and Hygiene: Total assistance (mod A sit<>stand with 2nd person to help wash and to A with clothing)    Mobility  Overal bed mobility: Needs Assistance Bed Mobility: Supine to Sit Rolling: Mod assist Supine to sit: HOB elevated, Mod assist General bed mobility comments: multimodal cues for sequencing. Assist to elevate trunk.    Transfers  Overall transfer level: Needs assistance Equipment used: Rolling walker (2 wheeled) Transfers: Sit to/from Stand, [employer] Transfers Sit to Stand: Mod assist, +2 safety/equipment Stand pivot transfers: Mod assist, +2 safety/equipment General transfer comment: Pivot transfer bed->BSC->recliner. Cueing required throughout transition to  keep pt on task. Verbal cues and physical assist for anterior weight shift out of heels and into forefeet.    Ambulation / Gait / Stairs / Wheelchair Mobility  Ambulation/Gait Ambulation/Gait assistance: Mod assist, +2 safety/equipment Ambulation Distance (Feet): 35 Feet Assistive device: Rolling walker (2 wheeled) Gait Pattern/deviations: Shuffle  General Gait Details: Short, shuffle steps noted. Pt able to correct with verbal cueing for 4-5 steps then returns to shuffle pattern. Assist needed during ambulation for anterior translation of weight out of her heels to prevent LOB backwards. Head in forward flexed position and unable to correct with cueing. Gait velocity: decreased Gait velocity interpretation: Below normal speed for age/gender    Posture / Balance Dynamic Sitting Balance Sitting balance - Comments: right lateral and posterior lean Balance Overall balance assessment: Needs assistance Sitting-balance support: Single extremity supported, Feet supported Sitting balance-Leahy Scale: Fair Sitting balance - Comments: right lateral and posterior lean Standing balance support: Bilateral upper extremity supported, During functional activity Standing balance-Leahy Scale: Poor Standing balance comment: retropulsive    Special needs/care consideration BiPAP/CPAP  no CPM  no Continuous Drip IV  no Dialysis  no        Life Vest   no Oxygen  no Special Bed    Trach Size   no Wound Vac (area)   no       Skin MASD perinium per nursing notes; ecchymosis left lateral arm                                 Bowel mgmt:  Last BM 12/14/15 Bladder mgmt: urinary incontinence Diabetic mgmt no     Previous Home Environment Living Arrangements: Alone Available Help at Discharge: Family, Available PRN/intermittently Type of Home: House Home Layout: Multi-level, Bed/bath upstairs, Able to live on main level with bedroom/bathroom Alternate Level Stairs-Rails: Left Alternate Level  Stairs-Number of Steps: 13 Home Access: Stairs to enter Entrance Stairs-Rails: Right, Left, Can reach both Entrance Stairs-Number of Steps: "several" Bathroom Toilet: Standard Home Care Services: No Additional Comments: Pt has rollator on main level and RW upstairs.  Discharge Living Setting Plans for Discharge Living Setting: Other (Comment) (pt. will Dc to daughter and son in [place] home) Type of Home at Discharge: House Discharge Home Layout: One level Discharge Home Access: Stairs to enter Entrance Stairs-Number of Steps: 1 Discharge Bathroom Shower/Tub: Walk-in shower Discharge Bathroom Toilet: Standard Discharge Bathroom Accessibility: Yes How Accessible: Accessible via wheelchair Does the patient have any problems obtaining your medications?: No  Social/Family/Support Systems Patient Roles: Parent (grandparent) Anticipated Caregiver: Pt's daughter, Beacher May is primary caregiver; pt's son in law is an OT; pt's son travels and will be available on a more limited basis Anticipated Caregiver's Contact Information: Beacher May , daughter , [phone] Ability/Limitations of Caregiver: [name] works part time from home as an [employer].  Monica and husband have 3 children, one of whom has special needs Caregiver Availability: Intermittent Discharge Plan Discussed with Primary Caregiver: Yes Is Caregiver In Agreement with Plan?: Yes Does Caregiver/Family have Issues with Lodging/Transportation while Pt is in Rehab?: Yes   Goals/Additional Needs Patient/Family Goal for Rehab: supervision and min assist PT/OT, supervision SLP Expected length of stay: 13-18 days Cultural Considerations: none Dietary Needs: heart healthy, thin liquids Equipment Needs: TBA Additional Information: Pt's daughter states she is attmepted to get her mom to move out of her inaccessible house about a year ago. but pt. was resistant.  [name] states she iw to meet with a realtor on Friday to put mom's house  on the market so pt. can come and live with daughter and son in law in an accessible home.   Pt/Family Agrees to Admission and willing to participate: Yes Program Orientation Provided & Reviewed with Pt/Caregiver Including  Roles  & Responsibilities: Yes   Decrease burden of Care through IP rehab admission: n/a   Possible need for SNF placement upon discharge: not anticipated   Patient Condition: This patient's condition remains as documented in the consult dated 12/20/15 , in which the Rehabilitation Physician determined and documented that the patient's condition is appropriate for intensive rehabilitative care in an inpatient rehabilitation facility. Will admit to inpatient rehab today.   Preadmission Screen Completed By:  Gerlean Ren, 12/20/2015 2:35 PM ______________________________________________________________________   Discussed status with Dr.  Naaman Plummer on 12/20/15 at  1435  and received telephone approval for admission today.  Admission Coordinator:  Gerlean Ren, time [id number] /Date 12/20/15

## 2015-12-20 NOTE — Progress Notes (Signed)
ANTICOAGULATION CONSULT NOTE - Initial Consult  Pharmacy Consult for Coumadin Indication: history of PE  Allergies  Allergen Reactions  . Crestor [Rosuvastatin Calcium] Other (See Comments)    Feeling very bad, aching all over.  . Erythromycin Hives  . Gabapentin Other (See Comments)    Blurred vision  . Pregabalin Other (See Comments)    Crossed vision.  . Carbidopa Other (See Comments)    Headaches, nausea, dizziness  . Macrobid [Nitrofurantoin] Hives  . Losartan Other (See Comments)    Patient Measurements: Height: 5\' 6"  (167.6 cm) Weight: 194 lb 1.6 oz (88 kg) IBW/kg (Calculated) : 59.3  Vital Signs: Temp: 97.8 F (36.6 C) (10/16 1834) Temp Source: Oral (10/16 1834) BP: 129/64 (10/16 1834) Pulse Rate: 83 (10/16 1834)  Labs:  Recent Labs  12/18/15 0316 12/19/15 0219 12/20/15 0737  HGB 13.4  --   --   HCT 40.4  --   --   PLT 264  --   --   LABPROT 29.9* 32.9* 30.2*  INR 2.77 3.13 2.81  CREATININE 0.59  --   --     Estimated Creatinine Clearance: 71 mL/min (by C-G formula based on SCr of 0.59 mg/dL).   Medical History: Past Medical History:  Diagnosis Date  . Arthritis   . Chronic insomnia   . Complication of anesthesia    severe itching night of surgery requiring meds- also couldnt swallow- throat "paralyzed""  . Fibromyalgia   . Glaucoma   . Hypercholesterolemia   . Hyperlipidemia   . Hypertension    PCP Dr Cari Caraway- [place]  05/15/11 with clearance and note on chart,    chest x ray, EKG 12/12 EPIC, eccho 7/11 EPIC  . Hypothyroidism    s/p graves disease  . Kidney stone   . PD (Parkinson's disease) ([place])   . Peripheral vascular disease ([place])    PULMONARY EMBOLUS x 2- 2011, 2012/ FOLLOWED BY DR ODOGWU-LOV 12/12 EPIC   PT STAES WILL STOP COUMADIN 3/12 and begin LOVONOX 05/17/11 as previously instructed  . Sinus infection    at present- dr Honor Loh PA aware- was seen there and at PCP 05/10/11  . Thyroid disease    Hypothyroidism    Medications:   Scheduled:  . [START ON 12/21/2015] aspirin EC  81 mg Oral QPC breakfast  . [START ON 12/21/2015] atorvastatin  10 mg Oral Daily  . azelastine  1 spray Each Nare BID  . [START ON 12/21/2015] Carbidopa-Levodopa ER  1 tablet Oral TID  . cycloSPORINE  1 drop Both Eyes BID  . [START ON 12/21/2015] DULoxetine  60 mg Oral Daily  . [START ON 12/21/2015] levothyroxine  150 mcg Oral QAC breakfast  . [START ON 12/21/2015] magnesium oxide  200 mg Oral Daily  . [START ON 12/21/2015] multivitamin with minerals  1 tablet Oral Daily  . [START ON 12/21/2015] omega-3 acid ethyl esters  1 g Oral Daily  . [START ON 12/21/2015] senna  1 tablet Oral Daily  . [START ON 12/21/2015] warfarin  3 mg Oral ONCE-1800  . [START ON 12/21/2015] Warfarin - Pharmacist Dosing Inpatient   Does not apply q1800  . zolpidem  5 mg Oral QHS    Assessment: Pt admitted s/p fall. No imaging evidence of bleeding. No bleeding reported. Pt takes PTA warfarin for hypercoaguable state. PTA dose is 4mg  daily. INR 10/11 in clinic was 3.0.  INR down this AM to 2.81 after dose reduction last night. Per nursing on 10/15, patient has been eating  25-30% of meals. H/H and Plt WNL as of 10/14. No bleeding noted.  Pt did not receive dose on 52M prior to transfer  Goal of Therapy:  INR 2-3 Monitor platelets by anticoagulation protocol: Yes   Plan:  Warfarin 3 mg PO x1 Daily INR Monitor CBC, s/sx bleeding, PO intake   Gracy Bruins, PharmD Clinical Pharmacist [facility]

## 2015-12-20 NOTE — Progress Notes (Signed)
Patient arrived on [address] [place].

## 2015-12-20 NOTE — Care Management Note (Signed)
Case Management Note  Patient Details  Name: [name] [name] MRN: [id number] Date of Birth: February 10, 1946  Subjective/Objective:                    Action/Plan: Patient discharging to CIR today. No further needs per CM.   Expected Discharge Date:                  Expected Discharge Plan:  [place]  In-House Referral:     Discharge planning Services     Post Acute Care Choice:    Choice offered to:     DME Arranged:    DME Agency:     HH Arranged:    [place] Agency:     Status of Service:  Completed, signed off  If discussed at [employer] of Stay Meetings, dates discussed:    Additional Comments:  [name], RN 12/20/2015, 12:08 PM

## 2015-12-20 NOTE — H&P (Signed)
Physical Medicine and Rehabilitation Admission H&P    Chief Complaint  Patient presents with  . Fall  . Neck Injury  . Shoulder Pain  : HPI: [name] [name] is a 70 y.o. right handed female with history of fibromyalgia, hypercoagulable disorder and history of pulmonary emboli on chronic Coumadin, Parkinson's disease with gait disturbance followed by Dr. [name] at [facility] neurological. Per chart review patient lives alone. Multilevel with bedroom on main floor. Patient used a rolling walker prior to admission. She has a son and daughter in the area question assist. son-in-law is a local occupational therapist.  Patient with reported 7 falls in the past 2 years. Presented 12/17/2015 after a fall with significant neck pain radiating to the shoulders. Cranial CT negative for acute changes. CT cervical spine showed moderately displaced fractures involving the posterior spinous process of C5. Conservative care recommended placed in a cervical collar. Hospital course pain management.Intermittent bouts of confusion cranial CT scan 2 negative for acute changes. Chronic Coumadin ongoing for pulmonary emboli. Physical and occupational therapy evaluations completed 12/19/2015 with recommendations of physical medicine rehabilitation consult.Patient was admitted for a comprehensive rehabilitation program  ROS Constitutional: Negative for chills and fever.  HENT: Negative for hearing loss.   Eyes: Positive for blurred vision.  Respiratory: Negative for cough and shortness of breath.   Cardiovascular: Positive for palpitations. Negative for chest pain and leg swelling.  Gastrointestinal: Positive for constipation. Negative for nausea and vomiting.  Genitourinary: Negative for dysuria and hematuria.  Musculoskeletal: Positive for falls.  Skin: Negative for rash.  Neurological: Positive for dizziness and headaches. Negative for seizures and loss of consciousness.  Psychiatric/Behavioral: The patient has  insomnia.   All other systems reviewed and are negative    Past Medical History:  Diagnosis Date  . Arthritis   . Chronic insomnia   . Complication of anesthesia    severe itching night of surgery requiring meds- also couldnt swallow- throat "paralyzed""  . Fibromyalgia   . Glaucoma   . Hypercholesterolemia   . Hyperlipidemia   . Hypertension    PCP Dr Cari Caraway- [place]  05/15/11 with clearance and note on chart,    chest x ray, EKG 12/12 [facility], eccho 7/11 [facility]  . Hypothyroidism    s/p graves disease  . Kidney stone   . PD (Parkinson's disease) ([place])   . Peripheral vascular disease ([place])    PULMONARY EMBOLUS x 2- 2011, 2012/ FOLLOWED BY DR [id number]-LOV 12/12 [facility]   PT STAES WILL STOP COUMADIN 3/12 and begin LOVONOX 05/17/11 as previously instructed  . Sinus infection    at present- dr Honor Loh PA aware- was seen there and at PCP 05/10/11  . Thyroid disease    Hypothyroidism   Past Surgical History:  Procedure Laterality Date  . ABDOMINAL HYSTERECTOMY    . APPENDECTOMY    . CATARACT EXTRACTION Bilateral   . CHOLECYSTECTOMY    . COLONOSCOPY    . EXPLORATORY LAPAROTOMY    . JOINT REPLACEMENT     left knee  7/12  . TOTAL KNEE ARTHROPLASTY  05/22/2011   Procedure: TOTAL KNEE ARTHROPLASTY;  Surgeon: Mauri Pole, MD;  Location: [facility] ORS;  Service: Orthopedics;  Laterality: Right;   Family History  Problem Relation Age of Onset  . Prostate cancer Father   . Stroke Father    Social History:  reports that she quit smoking about 26 years ago. She smoked 1.00 pack per day. She has never used smokeless tobacco. She reports that  she drinks alcohol. She reports that she does not use drugs. Allergies:  Allergies  Allergen Reactions  . Crestor [Rosuvastatin Calcium] Other (See Comments)    Feeling very bad, aching all over.  . Erythromycin Hives  . Pregabalin Other (See Comments)    Crossed vision.  Marland Kitchen Macrobid [Nitrofurantoin] Hives  . Carbidopa     Headaches, nausea, dizziness  .  Gabapentin Other (See Comments)    Blurred vision  . Losartan    Medications Prior to Admission  Medication Sig Dispense Refill  . acetaminophen (TYLENOL) 500 MG tablet Take 500 mg by mouth every 6 (six) hours as needed for mild pain.    Marland Kitchen aspirin 81 MG tablet Take 81 mg by mouth daily after breakfast.     . atorvastatin (LIPITOR) 10 MG tablet Take 10 mg by mouth daily.    Marland Kitchen azelastine (ASTELIN) 0.1 % nasal spray Place 1 spray into both nostrils 2 (two) times daily. Use in each nostril as directed    . Calcium Carbonate-Vitamin D (CALCIUM + D PO) Take 2 tablets by mouth at bedtime. 600-200 mg tab.    . Carbidopa-Levodopa ER (RYTARY) 23.75-95 MG CPCR Take 1 tablet by mouth 3 (three) times daily. 100 capsule 0  . Cholecalciferol (VITAMIN D3) 5000 UNITS CAPS Take 5,000 Units by mouth daily.     . cycloSPORINE (RESTASIS) 0.05 % ophthalmic emulsion Place 1 drop into both eyes daily as needed.    . DULoxetine (CYMBALTA) 60 MG capsule Take 1 capsule (60 mg total) by mouth daily. 30 capsule 5  . Magnesium 250 MG TABS Take 1 tablet by mouth at bedtime.     . Multiple Vitamin (MULTIVITAMIN) tablet Take 1 tablet by mouth daily.     . Omega-3 Fatty Acids (FISH OIL) 1200 MG CAPS Take 2 capsules by mouth at bedtime.     Marland Kitchen SYNTHROID 150 MCG tablet Take 150 mcg by mouth daily before breakfast.     . telmisartan-hydrochlorothiazide (MICARDIS HCT) 40-12.5 MG per tablet Take 1 tablet by mouth daily.   1  . vitamin B-12 (CYANOCOBALAMIN) 1000 MCG tablet Take 1,000 mcg by mouth daily.    Marland Kitchen warfarin (COUMADIN) 4 MG tablet Take 4 mg by mouth daily.    Marland Kitchen zolpidem (AMBIEN) 10 MG tablet Take 1 tablet (10 mg total) by mouth at bedtime. 30 tablet 2  . DULoxetine (CYMBALTA) 60 MG capsule Take 1 capsule (60 mg total) by mouth daily. (Patient not taking: Reported on 12/17/2015) 90 capsule 0    Home: Home Living Family/patient expects to be discharged to:: Skilled nursing facility Living Arrangements: Alone Available  Help at Discharge: Family, Available PRN/intermittently Type of Home: House Home Access: Stairs to enter [employer] of Steps: "several" Entrance Stairs-Rails: Right, Left, Can reach both Home Layout: Multi-level, Bed/bath upstairs, Able to live on main level with bedroom/bathroom Alternate Level Stairs-Number of Steps: 13 Alternate Level Stairs-Rails: Left Bathroom Toilet: Standard Home Equipment: Bedside commode, Walker - 4 wheels, Walker - 2 wheels Additional Comments: Pt has rollator on main level and RW upstairs.   Functional History: Prior Function Level of Independence: Independent with assistive device(s) Comments: Pt still drives. Ambulates in the community with rollator.  Functional Status:  Mobility: Bed Mobility Overal bed mobility: Needs Assistance Bed Mobility: Supine to Sit Rolling: Mod assist Supine to sit: HOB elevated, Max assist General bed mobility comments: Pt up in recliner. Transfers Overall transfer level: Needs assistance Equipment used: Rolling walker (2 wheeled) Transfers: Sit to/from Stand  Sit to Stand: Max assist Stand pivot transfers: Mod assist General transfer comment: verbal cues for hand placement, assist with anterior weight shift to power up. Pt retropulsive with stance. Mod assist required to maintain standing. Pt able to stand x 3 trials. Ambulation/Gait General Gait Details: unable to progress LE for gait    ADL: ADL Overall ADL's : Needs assistance/impaired Eating/Feeding: Moderate assistance, Bed level Grooming: Wash/dry face, Wash/dry hands, Set up, Supervision/safety (sitting in recliner) Upper Body Bathing: Moderate assistance, Sitting (in recliner) Lower Body Bathing: Total assistance (mod A sit<>stand with 2nd person to help wash) Upper Body Dressing : Total assistance (sitting in recliner) Lower Body Dressing: Total assistance (mod A sit<>stand with 2nd person to help with clothes) Toilet Transfer: Moderate  assistance, Stand-pivot, BSC (with extreme increased time and A for weight shifts) Toileting- Clothing Manipulation and Hygiene: Total assistance (mod A sit<>stand with 2nd person to help wash and to A with clothing)  Cognition: Cognition Overall Cognitive Status: Impaired/Different from baseline Orientation Level: Oriented X4 Cognition Arousal/Alertness: Awake/alert Behavior During Therapy: WFL for tasks assessed/performed Overall Cognitive Status: Impaired/Different from baseline Area of Impairment: Orientation, Attention, Memory, Following commands, Safety/judgement, Problem solving Orientation Level: Disoriented to, Situation, Time Current Attention Level: Selective Memory: Decreased recall of precautions, Decreased short-term memory Following Commands: Follows one step commands with increased time, Follows multi-step commands inconsistently Safety/Judgement: Decreased awareness of safety, Decreased awareness of deficits Problem Solving: Slow processing, Decreased initiation, Difficulty sequencing, Requires verbal cues, Requires tactile cues  Physical Exam: Blood pressure (!) 126/54, pulse 77, temperature 97.9 F (36.6 C), temperature source Oral, resp. rate 20, height 5\' 6"  (1.676 m), weight 88.5 kg (195 lb), SpO2 96 %. Physical Exam HENT:  Head: Normocephalic.  Eyes: EOM are normal.  Cardiovascular: Normal rate and regular rhythm.   Respiratory: Effort normal and breath sounds normal. No respiratory distress.  GI: Soft. Bowel sounds are normal. She exhibits no distension.  Neurological:  Patient's eyes are open. Makes eye contact. Does not initiate conversation. Does answer when cued. Moves all 4's without sign of focal weakness. Do not see focal sensory loss either. Processing extremely delayed. Limited awareness  Skin: Skin is warm and dry.  Psychiatric:  Flat, disengaged    Results for orders placed or performed during the hospital encounter of 12/17/15 (from the past 48  hour(s))  Protime-INR     Status: Abnormal   Collection Time: 12/19/15  2:19 AM  Result Value Ref Range   Prothrombin Time 32.9 (H) 11.4 - 15.2 seconds   INR 3.13   Protime-INR     Status: Abnormal   Collection Time: 12/20/15  7:37 AM  Result Value Ref Range   Prothrombin Time 30.2 (H) 11.4 - 15.2 seconds   INR 2.81    Ct Head Wo Contrast  Result Date: 12/20/2015 CLINICAL DATA:  Fall earlier today.  Parkinson's disease. EXAM: CT HEAD WITHOUT CONTRAST TECHNIQUE: Contiguous axial images were obtained from the base of the skull through the vertex without intravenous contrast. COMPARISON:  December 17, 2015 FINDINGS: Brain: Mild diffuse atrophy remain stable. There is a cavum septum pellucidum, an anatomic variant. There is no intracranial mass, hemorrhage, extra-axial fluid collection, or midline shift. There is patchy small vessel disease in the centra semiovale bilaterally, stable. There is no new gray-white compartment lesion. No acute infarct is evident. Vascular: No hyperdense vessels are evident. There is calcification in both carotid siphon regions, somewhat more on the right than on the left. Skull: The bony calvarium appears  intact. Sinuses/Orbits: There is mucosal thickening in multiple ethmoid air cells bilaterally. There is slight mucosal thickening in the inferior right frontal sinus. Mild mucosal thickening is noted in the visualized right maxillary antrum. Other visualized paranasal sinuses are clear. There is slight rightward deviation of the nasal septum. Orbits appear symmetric bilaterally. Other: Mastoid air cells are clear. IMPRESSION: Stable atrophy with patchy periventricular small vessel disease. No acute infarct. No intracranial mass, hemorrhage, or extra-axial fluid collection. Foci of arterial vascular calcification noted. Areas of paranasal sinus disease noted. Electronically Signed   By: Lowella Grip III M.D.   On: 12/20/2015 07:11       Medical Problem List and  Plan: 1.  Debilitation secondary to C5 fracture after fall as well as history of Parkinson's disease. Cervical collar at all times  -admit to inpatient rehab 2.  DVT Prophylaxis/Anticoagulation: Chronic Coumadin for history of pulmonary emboli/hypercoagulable state. Monitor for any bleeding episodes 3. Pain Management: Oxycodone as needed 4. Mood: Cymbalta 60 mg daily 5. Neuropsych: This patient is capable of making decisions on her own behalf. 6. Skin/Wound Care: Routine skin checks 7. Fluids/Electrolytes/Nutrition: Routine I&O with follow-up chemistries 8. Parkinson disease. Sinemet 25-100 3 times a day. Followed by Dr. Adline Peals neurological 9. Hypertension. HCTZ 12.5 mg daily, Diovan 160 mg daily. Monitor with increased mobility2 10. Hypothyroidism. Synthroid. TSH 0.61 11. Hyperlipidemia. Lipitor, Lovaza  Post Admission Physician Evaluation: 1. Functional deficits secondary  to C5 fracture. 2. Patient is admitted to receive collaborative, interdisciplinary care between the physiatrist, rehab nursing staff, and therapy team. 3. Patient's level of medical complexity and substantial therapy needs in context of that medical necessity cannot be provided at a lesser intensity of care such as a SNF. 4. Patient has experienced substantial functional loss from his/her baseline which was documented above under the "Functional History" and "Functional Status" headings.  Judging by the patient's diagnosis, physical exam, and functional history, the patient has potential for functional progress which will result in measurable gains while on inpatient rehab.  These gains will be of substantial and practical use upon discharge  in facilitating mobility and self-care at the household level. 5. Physiatrist will provide 24 hour management of medical needs as well as oversight of the therapy plan/treatment and provide guidance as appropriate regarding the interaction of the two. 6. 24 hour rehab nursing  will assist with bladder management, bowel management, safety, skin/wound care, disease management, medication administration, pain management and patient education  and help integrate therapy concepts, techniques,education, etc. 7. PT will assess and treat for/with: Lower extremity strength, range of motion, stamina, balance, functional mobility, safety, adaptive techniques and equipment, NMR, balance, w/c use, ego support.   Goals are: supervision to min assist. 8. OT will assess and treat for/with: ADL's, functional mobility, safety, upper extremity strength, adaptive techniques and equipment. NMR, ego support, family education, community reintegration.   Goals are: supervision to min assist. Therapy may proceed with showering this patient. 9. SLP will assess and treat for/with: n/.  Goals are: n/a---pt ?near baseline?---monitor clinically. 10. Case Management and Social Worker will assess and treat for psychological issues and discharge planning. 11. Team conference will be held weekly to assess progress toward goals and to determine barriers to discharge. 12. Patient will receive at least 3 hours of therapy per day at least 5 days per week. 13. ELOS: 12-18 days       14. Prognosis:  good     [name], MD, [facility] 12/20/2015  12/20/2015

## 2015-12-20 NOTE — Progress Notes (Signed)
Inpatient Rehabilitation  I met with the patient and [name] to discuss the recommendation for IP rehab.  I provided informational booklets and answered all questions.  Pt. is unable to carry on meaningful discussion surrounding rehab plans due to her cognitive status.  [name] is pleased and relieved that pt. is able to come to IP Rehab.  I have discussed the case with Dr. Charlies Silvers who gives medical clearance to admit pt. today.  I will make all necessary arrangements.  I have updated pt.'s RN Lieutenant Diego, RNCM and Darden Dates, CSW of the plan.  Please call if questions.  [place] Admissions Coordinator Cell [phone] Office [phone]

## 2015-12-20 NOTE — Interval H&P Note (Signed)
Koren Maran was admitted today to Inpatient Rehabilitation with the diagnosis of C5 Fracture after fall, hx of PD.  The patient's history has been reviewed, patient examined, and there is no change in status.  Patient continues to be appropriate for intensive inpatient rehabilitation.  I have reviewed the patient's chart and labs.  Questions were answered to the patient's satisfaction. The PAPE has been reviewed and assessment remains appropriate.  Angelo Prindle T 12/20/2015, 9:01 PM

## 2015-12-20 NOTE — Progress Notes (Signed)
Physical Therapy Treatment Patient Details Name: [name] [name] MRN: [id number] DOB: 10/01/1945 Today's Date: 12/20/2015    History of Present Illness 70 y.o. female with a Past Medical History of parkinsons and FM who presents with fall. Xray revealed displaced C5 fx.    PT Comments    Pt continues to demo cognitive changes from her baseline but appears to be improving. She had increased participation with therapy today and progressed to ambulation in the hallway. PT to continue per POC. Pt is a good rehab candidate.   Follow Up Recommendations  CIR     Equipment Recommendations  None recommended by PT    Recommendations for Other Services       Precautions / Restrictions Precautions Precautions: Fall;Cervical Required Braces or Orthoses: Cervical Brace Cervical Brace: Hard collar;At all times Restrictions Weight Bearing Restrictions: No    Mobility  Bed Mobility   Bed Mobility: Supine to Sit     Supine to sit: HOB elevated;Mod assist     General bed mobility comments: multimodal cues for sequencing. Assist to elevate trunk.  Transfers   Equipment used: Conservation officer, nature (2 wheeled) Transfers: Sit to/from [employer] to Stand: Mod assist;+2 safety/equipment Stand pivot transfers: Mod assist;+2 safety/equipment       General transfer comment: Pivot transfer bed->BSC->recliner. Cueing required throughout transition to keep pt on task. Verbal cues and physical assist for anterior weight shift out of heels and into forefeet.  Ambulation/Gait Ambulation/Gait assistance: Mod assist;+2 safety/equipment Ambulation Distance (Feet): 35 Feet Assistive device: Rolling walker (2 wheeled) Gait Pattern/deviations: Shuffle Gait velocity: decreased Gait velocity interpretation: Below normal speed for age/gender General Gait Details: Short, shuffle steps noted. Pt able to correct with verbal cueing for 4-5 steps then returns to shuffle pattern. Assist needed  during ambulation for anterior translation of weight out of her heels to prevent LOB backwards. Head in forward flexed position and unable to correct with cueing.   Stairs            Wheelchair Mobility    Modified Rankin (Stroke Patients Only)       Balance   Sitting-balance support: Single extremity supported;Feet supported Sitting balance-Leahy Scale: Fair     Standing balance support: Bilateral upper extremity supported;During functional activity Standing balance-Leahy Scale: Poor Standing balance comment: retropulsive                    Cognition Arousal/Alertness: Awake/alert Behavior During Therapy: Flat affect (However, pt came alive at end of session when her daughter and grandson came into room.) Overall Cognitive Status: Impaired/Different from baseline Area of Impairment: Attention;Memory;Following commands;Safety/judgement;Problem solving   Current Attention Level: Selective Memory: Decreased short-term memory Following Commands: Follows one step commands with increased time;Follows multi-step commands inconsistently Safety/Judgement: Decreased awareness of safety   Problem Solving: Slow processing;Decreased initiation;Difficulty sequencing;Requires verbal cues;Requires tactile cues General Comments: Pt appears unwilling to interact verbally during sessions making it difficult to fully assess her cognition.     Exercises      General Comments        Pertinent Vitals/Pain Pain Assessment: Faces Faces Pain Scale: Hurts even more Pain Location: neck/shoulders Pain Descriptors / Indicators: Guarding;Grimacing Pain Intervention(s): Repositioned;Monitored during session    Home Living                      Prior Function            PT Goals (current goals can now be found in the care  plan section) Acute Rehab PT Goals Patient Stated Goal: home PT Goal Formulation: With patient Time For Goal Achievement: 01/01/16 Potential to  Achieve Goals: Good Progress towards PT goals: Progressing toward goals    Frequency    Min 5X/week      PT Plan Current plan remains appropriate    Co-evaluation             End of Session Equipment Utilized During Treatment: Gait belt;Cervical collar Activity Tolerance: Patient tolerated treatment well Patient left: in chair;with chair alarm set;with call bell/phone within reach;with family/visitor present     Time: [id number] PT Time Calculation (min) (ACUTE ONLY): 32 min  Charges:  $Gait Training: 8-22 mins $Therapeutic Activity: 8-22 mins                    G Codes:      [name] 12/20/2015, 11:27 AM

## 2015-12-20 NOTE — Consult Note (Signed)
Physical Medicine and Rehabilitation Consult Reason for Consult: Displaced C5 fracture after fall Referring Physician: Charlies Silvers   HPI: [name] [name] is a 70 y.o. right handed female with history of fibromyalgia, hypercoagulable disorder and history of pulmonary emboli on chronic Coumadin, Parkinson's disease with gait disturbance followed by Dr. [name] at [facility]. Per chart review patient lives alone. Multilevel with bedroom on main floor. Patient used a rolling walker prior to admission. She has a son and daughter in the area question assist Patient with reported 7 falls in the past 2 years. Presented 12/17/2015 after a fall with significant neck pain radiating to the shoulders. Cranial CT negative for acute changes. CT cervical spine showed moderately displaced fractures involving the posterior spinous process of C5. Conservative care recommended placed in a cervical collar. Hospital course pain management. Chronic Coumadin ongoing for pulmonary emboli. Physical and occupational therapy evaluations completed 12/19/2015 with recommendations of physical medicine rehabilitation consult   Review of Systems  Constitutional: Negative for chills and fever.  HENT: Negative for hearing loss.   Eyes: Positive for blurred vision.  Respiratory: Negative for cough and shortness of breath.   Cardiovascular: Positive for palpitations. Negative for chest pain and leg swelling.  Gastrointestinal: Positive for constipation. Negative for nausea and vomiting.  Genitourinary: Negative for dysuria and hematuria.  Musculoskeletal: Positive for falls.  Skin: Negative for rash.  Neurological: Positive for dizziness and headaches. Negative for seizures and loss of consciousness.  Psychiatric/Behavioral: The patient has insomnia.   All other systems reviewed and are negative.  Past Medical History:  Diagnosis Date  . Arthritis   . Chronic insomnia   . Complication of anesthesia    severe  itching night of surgery requiring meds- also couldnt swallow- throat "paralyzed""  . Fibromyalgia   . Glaucoma   . Hypercholesterolemia   . Hyperlipidemia   . Hypertension    PCP Dr Cari Caraway- [place]  05/15/11 with clearance and note on chart,    chest x ray, EKG 12/12 [facility], eccho 7/11 [facility]  . Hypothyroidism    s/p graves disease  . Kidney stone   . PD (Parkinson's disease) ([place])   . Peripheral vascular disease ([place])    PULMONARY EMBOLUS x 2- 2011, 2012/ FOLLOWED BY DR [id number] 12/12 [facility]   PT STAES WILL STOP COUMADIN 3/12 and begin LOVONOX 05/17/11 as previously instructed  . Sinus infection    at present- dr Honor Loh PA aware- was seen there and at PCP 05/10/11  . Thyroid disease    Hypothyroidism   Past Surgical History:  Procedure Laterality Date  . ABDOMINAL HYSTERECTOMY    . APPENDECTOMY    . CATARACT EXTRACTION Bilateral   . CHOLECYSTECTOMY    . COLONOSCOPY    . EXPLORATORY LAPAROTOMY    . JOINT REPLACEMENT     left knee  7/12  . TOTAL KNEE ARTHROPLASTY  05/22/2011   Procedure: TOTAL KNEE ARTHROPLASTY;  Surgeon: Mauri Pole, MD;  Location: [facility];  Service: Orthopedics;  Laterality: Right;   Family History  Problem Relation Age of Onset  . Prostate cancer Father   . Stroke Father    Social History:  reports that she quit smoking about 26 years ago. She smoked 1.00 pack per day. She has never used smokeless tobacco. She reports that she drinks alcohol. She reports that she does not use drugs. Allergies:  Allergies  Allergen Reactions  . Crestor [Rosuvastatin Calcium] Other (See Comments)    Feeling very bad,  aching all over.  . Erythromycin Hives  . Pregabalin Other (See Comments)    Crossed vision.  Marland Kitchen Macrobid [Nitrofurantoin] Hives  . Carbidopa     Headaches, nausea, dizziness  . Gabapentin Other (See Comments)    Blurred vision  . Losartan    Medications Prior to Admission  Medication Sig Dispense Refill  . acetaminophen (TYLENOL) 500 MG tablet Take 500  mg by mouth every 6 (six) hours as needed for mild pain.    Marland Kitchen aspirin 81 MG tablet Take 81 mg by mouth daily after breakfast.     . atorvastatin (LIPITOR) 10 MG tablet Take 10 mg by mouth daily.    Marland Kitchen azelastine (ASTELIN) 0.1 % nasal spray Place 1 spray into both nostrils 2 (two) times daily. Use in each nostril as directed    . Calcium Carbonate-Vitamin D (CALCIUM + D PO) Take 2 tablets by mouth at bedtime. 600-200 mg tab.    . Carbidopa-Levodopa ER (RYTARY) 23.75-95 MG CPCR Take 1 tablet by mouth 3 (three) times daily. 100 capsule 0  . Cholecalciferol (VITAMIN D3) 5000 UNITS CAPS Take 5,000 Units by mouth daily.     . cycloSPORINE (RESTASIS) 0.05 % ophthalmic emulsion Place 1 drop into both eyes daily as needed.    . DULoxetine (CYMBALTA) 60 MG capsule Take 1 capsule (60 mg total) by mouth daily. 30 capsule 5  . Magnesium 250 MG TABS Take 1 tablet by mouth at bedtime.     . Multiple Vitamin (MULTIVITAMIN) tablet Take 1 tablet by mouth daily.     . Omega-3 Fatty Acids (FISH OIL) 1200 MG CAPS Take 2 capsules by mouth at bedtime.     Marland Kitchen SYNTHROID 150 MCG tablet Take 150 mcg by mouth daily before breakfast.     . telmisartan-hydrochlorothiazide (MICARDIS HCT) 40-12.5 MG per tablet Take 1 tablet by mouth daily.   1  . vitamin B-12 (CYANOCOBALAMIN) 1000 MCG tablet Take 1,000 mcg by mouth daily.    Marland Kitchen warfarin (COUMADIN) 4 MG tablet Take 4 mg by mouth daily.    Marland Kitchen zolpidem (AMBIEN) 10 MG tablet Take 1 tablet (10 mg total) by mouth at bedtime. 30 tablet 2  . DULoxetine (CYMBALTA) 60 MG capsule Take 1 capsule (60 mg total) by mouth daily. (Patient not taking: Reported on 12/17/2015) 90 capsule 0    Home: Home Living Family/patient expects to be discharged to:: Skilled nursing facility Living Arrangements: Alone Available Help at Discharge: Family, Available PRN/intermittently Type of Home: House Home Access: Stairs to enter [employer] of Steps: "several" Entrance Stairs-Rails: Right,  Left, Can reach both Home Layout: Multi-level, Bed/bath upstairs, Able to live on main level with bedroom/bathroom Alternate Level Stairs-Number of Steps: 13 Alternate Level Stairs-Rails: Left Bathroom Toilet: Standard Home Equipment: Bedside commode, Walker - 4 wheels, Walker - 2 wheels Additional Comments: Pt has rollator on main level and RW upstairs.  Functional History: Prior Function Level of Independence: Independent with assistive device(s) Comments: Pt still drives. Ambulates in the community with rollator. Functional Status:  Mobility: Bed Mobility Overal bed mobility: Needs Assistance Bed Mobility: Supine to Sit Rolling: Mod assist Supine to sit: HOB elevated, Max assist General bed mobility comments: Pt up in recliner. Transfers Overall transfer level: Needs assistance Equipment used: Rolling walker (2 wheeled) Transfers: Sit to/from Stand Sit to Stand: Max assist Stand pivot transfers: Mod assist General transfer comment: verbal cues for hand placement, assist with anterior weight shift to power up. Pt retropulsive with stance. Mod assist required  to maintain standing. Pt able to stand x 3 trials. Ambulation/Gait General Gait Details: unable to progress LE for gait    ADL: ADL Overall ADL's : Needs assistance/impaired Eating/Feeding: Moderate assistance, Bed level Grooming: Wash/dry face, Wash/dry hands, Set up, Supervision/safety (sitting in recliner) Upper Body Bathing: Moderate assistance, Sitting (in recliner) Lower Body Bathing: Total assistance (mod A sit<>stand with 2nd person to help wash) Upper Body Dressing : Total assistance (sitting in recliner) Lower Body Dressing: Total assistance (mod A sit<>stand with 2nd person to help with clothes) Toilet Transfer: Moderate assistance, Stand-pivot, BSC (with extreme increased time and A for weight shifts) Toileting- Clothing Manipulation and Hygiene: Total assistance (mod A sit<>stand with 2nd person to help wash  and to A with clothing)  Cognition: Cognition Overall Cognitive Status: Impaired/Different from baseline Orientation Level: Oriented X4 Cognition Arousal/Alertness: Awake/alert Behavior During Therapy: WFL for tasks assessed/performed Overall Cognitive Status: Impaired/Different from baseline Area of Impairment: Orientation, Attention, Memory, Following commands, Safety/judgement, Problem solving Orientation Level: Disoriented to, Situation, Time Current Attention Level: Selective Memory: Decreased recall of precautions, Decreased short-term memory Following Commands: Follows one step commands with increased time, Follows multi-step commands inconsistently Safety/Judgement: Decreased awareness of safety, Decreased awareness of deficits Problem Solving: Slow processing, Decreased initiation, Difficulty sequencing, Requires verbal cues, Requires tactile cues  Blood pressure (!) 126/54, pulse 77, temperature 97.9 F (36.6 C), temperature source Oral, resp. rate 20, height 5\' 6"  (1.676 m), weight 88.5 kg (195 lb), SpO2 96 %. Physical Exam  HENT:  Head: Normocephalic.  Eyes: EOM are normal.  Cardiovascular: Normal rate and regular rhythm.   Respiratory: Effort normal and breath sounds normal. No respiratory distress.  GI: Soft. Bowel sounds are normal. She exhibits no distension.  Neurological:  Patient's eyes are open. Makes eye contact. Does not initiate conversation. Does answer when cued. Moves all 4's without sign of focal weakness. Do not see focal sensory loss either. Processing extremely delayed. Limited awareness  Skin: Skin is warm and dry.  Psychiatric:  Flat, disengaged    No results found for this or any previous visit (from the past 24 hour(s)). No results found.  Assessment/Plan: Diagnosis: 70 yo female with Parkinson's disease s/p C5 Fracture after fall 1. Does the need for close, 24 hr/day medical supervision in concert with the patient's rehab needs make it  unreasonable for this patient to be served in a less intensive setting? Yes and Potentially 2. Co-Morbidities requiring supervision/potential complications: chronic low back pain, FMS 3. Due to bladder management, bowel management, safety, skin/wound care, disease management, medication administration, pain management and patient education, does the patient require 24 hr/day rehab nursing? Yes 4. Does the patient require coordinated care of a physician, rehab nurse, PT (1-2 hrs/day, 5 days/week) and OT (1-2 hrs/day, 5 days/week) to address physical and functional deficits in the context of the above medical diagnosis(es)? Yes Addressing deficits in the following areas: balance, endurance, locomotion, strength, transferring, bowel/bladder control, bathing, dressing, feeding, grooming, toileting and psychosocial support 5. Can the patient actively participate in an intensive therapy program of at least 3 hrs of therapy per day at least 5 days per week? Yes 6. The potential for patient to make measurable gains while on inpatient rehab is excellent 7. Anticipated functional outcomes upon discharge from inpatient rehab are supervision and min assist  with PT, supervision and min assist with OT, TBD? with SLP. 8. Estimated rehab length of stay to reach the above functional goals is: 13-18 days? 9. Does the patient have  adequate social supports and living environment to accommodate these discharge functional goals? Yes and Potentially 10. Anticipated D/C setting: Home 11. Anticipated post D/C treatments: [place] therapy 12. Overall Rehab/Functional Prognosis: excellent  RECOMMENDATIONS: This patient's condition is appropriate for continued rehabilitative care in the following setting: CIR Patient has agreed to participate in recommended program. Potentially Note that insurance prior authorization may be required for reimbursement for recommended care.  Comment: Rehab Admissions Coordinator to follow  up.  Thanks,  [name], MD, Mellody Drown     12/20/2015

## 2015-12-20 NOTE — Discharge Summary (Signed)
Physician Discharge Summary  [name] [name] [id number] DOB: 1946/02/06 DOA: 12/17/2015  PCP: Lamar Blinks, MD  Admit date: 12/17/2015 Discharge date: 12/20/2015  Recommendations for Outpatient Follow-up:  If mental status changes please do MRI brian Please monitor INR daily and adjust coumadin so that the INR is 2-3 Continue senokot for bowel regimen  Discharge Diagnoses:  Principal Problem:   Displaced fracture of fifth cervical vertebra (HCC) Active Problems:   S/P right TKA   Hypercoagulable state ([place])   History of pulmonary embolism   Fibromyalgia syndrome   Hypothyroidism   Parkinson's disease (HCC)   Chronic low back pain   Intractable pain   Gait disturbance    Discharge Condition: stable   Diet recommendation: as tolerated   History of present illness:  70 y.o.femalewith past medical history significant for hypercoagulable disorder and history of PE on chronic Coumadin anticoagulation, hypothyroidism, Parkinson's disease with associated gait disturbance, hypothyroidism, fibromyalgia, history of prior right knee surgery further influencing gait disturbance. Patient reported 7 falls in the past 2 years. Most recently she was walking up the stairs and fell backwards and then had severe back and neck pain.  In ED, pt was hemodynamically stable. CT head and cervical spine showed diffuse cortical atrophy without acute intracranial abnormality seen, mild degenerative disc disease noted at C6-C7 with a moderately displaced fracture involving the posterior spinous process at C5. Shoulder x ray showed no fracture. CXR showed no active disease. X ray pelvis showed no fractures. Neck collar was placed and she was admitted for pain management.   Hospital Course:    Assessment & Plan:   Principal Problem:   Displaced fracture of fifth cervical vertebra (HCC) / Frequent falls  - Awaiting neurosurgery consultation  - Has neck collar - Continue pain management efforts     Active Problems:   S/P right TKA - Stable    Hypercoagulable state ([place]) / History of pulmonary embolism - Continue coumadin - Pt requires ongoing daily INR check since her INR needs to be 2-3    Hypothyroidism - Continue synthroid    Parkinson's disease (HCC) - Continue sinemet    Essential hypertension Tachycardia - Continue hctz and Diovan  - Continue BP monitoring on daily basis  - Please continue to monitor HR (HR 77-116)     Dyslipidemia - Continue Lipitor and omega 3     Acute encephalopathy - Slightly confused but otherwise no focal findings - CT head without acute findings - If mental status changes please consider MRI brain    DVT prophylaxis: on Coumadin Code Status:DNR/DNI Family Communication: no family at the bedside; spoke with daughter over the phone this am    Consultants:   Neurosurgery   Inpatient rehab   Procedures:  None   Antimicrobials:   None   Signed:  Leisa Lenz, MD  [facility] 12/20/2015, 11:39 AM  Pager #: [phone]  Time spent in minutes: less than 30 minutes    Discharge Exam: Vitals:   12/20/15 0020 12/20/15 0436  BP: (!) 122/57 (!) 126/54  Pulse: 85 77  Resp: 20 20  Temp: 98.2 F (36.8 C) 97.9 F (36.6 C)   Vitals:   12/19/15 1709 12/19/15 2105 12/20/15 0020 12/20/15 0436  BP: (!) 142/81 140/62 (!) 122/57 (!) 126/54  Pulse: (!) 106 79 85 77  Resp: (!) 22 (!) 22 20 20   Temp: 98 F (36.7 C) 98 F (36.7 C) 98.2 F (36.8 C) 97.9 F (36.6 C)  TempSrc: Oral Oral Oral Oral  SpO2: 96% 96% 95% 96%  Weight:      Height:        General: Pt is alert, follows commands appropriately, not in acute distress Cardiovascular: Regular rate and rhythm, S1/S2 +, no murmurs Respiratory: Clear to auscultation bilaterally, no wheezing, no crackles, no rhonchi Abdominal: Soft, non tender, non distended, bowel sounds +, no guarding Extremities: no edema, no cyanosis, pulses palpable bilaterally  DP and PT Neuro: Grossly nonfocal  Discharge Instructions  Discharge Instructions    Call MD for:  difficulty breathing, headache or visual disturbances    Complete by:  As directed    Call MD for:  persistant nausea and vomiting    Complete by:  As directed    Call MD for:  redness, tenderness, or signs of infection (pain, swelling, redness, odor or green/yellow discharge around incision site)    Complete by:  As directed    Call MD for:  severe uncontrolled pain    Complete by:  As directed    Diet - low sodium heart healthy    Complete by:  As directed    Discharge instructions    Complete by:  As directed    If mental status changes please do MRI brian Please monitor INR daily and adjust coumadin so that the INR is 2-3 Continue senokot for bowel regimen   Increase activity slowly    Complete by:  As directed        Medication List    TAKE these medications   acetaminophen 500 MG tablet Commonly known as:  TYLENOL Take 500 mg by mouth every 6 (six) hours as needed for mild pain.   aspirin 81 MG tablet Take 81 mg by mouth daily after breakfast.   atorvastatin 10 MG tablet Commonly known as:  LIPITOR Take 10 mg by mouth daily.   azelastine 0.1 % nasal spray Commonly known as:  ASTELIN Place 1 spray into both nostrils 2 (two) times daily. Use in each nostril as directed   CALCIUM + D PO Take 2 tablets by mouth at bedtime. 600-200 mg tab.   Carbidopa-Levodopa ER 23.75-95 MG Cpcr Commonly known as:  RYTARY Take 1 tablet by mouth 3 (three) times daily.   cycloSPORINE 0.05 % ophthalmic emulsion Commonly known as:  RESTASIS Place 1 drop into both eyes daily as needed.   DULoxetine 60 MG capsule Commonly known as:  CYMBALTA Take 1 capsule (60 mg total) by mouth daily.   DULoxetine 60 MG capsule Commonly known as:  CYMBALTA Take 1 capsule (60 mg total) by mouth daily.   Fish Oil 1200 MG Caps Take 2 capsules by mouth at bedtime.   HYDROmorphone 1 MG/ML  injection Commonly known as:  DILAUDID Inject 1 mL (1 mg total) into the skin every 4 (four) hours as needed for severe pain (breakthrough pain).   Magnesium 250 MG Tabs Take 1 tablet by mouth at bedtime.   magnesium oxide 400 (241.3 Mg) MG tablet Commonly known as:  MAG-OX Take 0.5 tablets (200 mg total) by mouth daily. Start taking on:  12/21/2015   multivitamin tablet Take 1 tablet by mouth daily.   senna 8.6 MG Tabs tablet Commonly known as:  SENOKOT Take 1 tablet (8.6 mg total) by mouth daily.   SYNTHROID 150 MCG tablet Generic drug:  levothyroxine Take 150 mcg by mouth daily before breakfast.   telmisartan-hydrochlorothiazide 40-12.5 MG tablet Commonly known as:  MICARDIS HCT Take 1 tablet by mouth daily.   vitamin B-12 1000  MCG tablet Commonly known as:  CYANOCOBALAMIN Take 1,000 mcg by mouth daily.   Vitamin D3 5000 units Caps Take 5,000 Units by mouth daily.   warfarin 3 MG tablet Commonly known as:  COUMADIN Take 1 tablet (3 mg total) by mouth one time only at 6 PM. What changed:  medication strength  how much to take  when to take this   zolpidem 10 MG tablet Commonly known as:  AMBIEN Take 1 tablet (10 mg total) by mouth at bedtime.      Follow-up Information    Kevan Ny Ditty, MD.   Specialty:  Neurosurgery Contact information: [address] [place] [address] [phone]        [name],[name], MD. Schedule an appointment as soon as possible for a visit in 1 week(s).   Specialty:  Family Medicine Contact information: [place] [address] [phone]            The results of significant diagnostics from this hospitalization (including imaging, microbiology, ancillary and laboratory) are listed below for reference.    Significant Diagnostic Studies: Dg Chest 1 View  Result Date: 12/17/2015 CLINICAL DATA:  Fall EXAM: CHEST 1 VIEW COMPARISON:  11/26/2010 FINDINGS:  Borderline cardiomegaly. No acute infiltrate or pleural effusion. No pulmonary edema. Mild degenerative changes mid thoracic spine. IMPRESSION: No active disease. Electronically Signed   By: Lahoma Crocker M.D.   On: 12/17/2015 11:24   Dg Pelvis 1-2 Views  Result Date: 12/17/2015 CLINICAL DATA:  Fall with C5 fracture. EXAM: PELVIS - 1-2 VIEW COMPARISON:  Abdominal CT 09/07/2012 FINDINGS: Limited evaluation of the sacrum due to bowel gas. No suspected fracture. No diastasis. Osteopenia. Lower lumbar facet arthropathy IMPRESSION: No acute finding. Electronically Signed   By: Monte Fantasia M.D.   On: 12/17/2015 11:18   Ct Head Wo Contrast  Result Date: 12/20/2015 CLINICAL DATA:  Fall earlier today.  Parkinson's disease. EXAM: CT HEAD WITHOUT CONTRAST TECHNIQUE: Contiguous axial images were obtained from the base of the skull through the vertex without intravenous contrast. COMPARISON:  December 17, 2015 FINDINGS: Brain: Mild diffuse atrophy remain stable. There is a cavum septum pellucidum, an anatomic variant. There is no intracranial mass, hemorrhage, extra-axial fluid collection, or midline shift. There is patchy small vessel disease in the centra semiovale bilaterally, stable. There is no new gray-white compartment lesion. No acute infarct is evident. Vascular: No hyperdense vessels are evident. There is calcification in both carotid siphon regions, somewhat more on the right than on the left. Skull: The bony calvarium appears intact. Sinuses/Orbits: There is mucosal thickening in multiple ethmoid air cells bilaterally. There is slight mucosal thickening in the inferior right frontal sinus. Mild mucosal thickening is noted in the visualized right maxillary antrum. Other visualized paranasal sinuses are clear. There is slight rightward deviation of the nasal septum. Orbits appear symmetric bilaterally. Other: Mastoid air cells are clear. IMPRESSION: Stable atrophy with patchy periventricular small vessel  disease. No acute infarct. No intracranial mass, hemorrhage, or extra-axial fluid collection. Foci of arterial vascular calcification noted. Areas of paranasal sinus disease noted. Electronically Signed   By: Lowella Grip III M.D.   On: 12/20/2015 07:11   Ct Head Wo Contrast  Result Date: 12/17/2015 CLINICAL DATA:  Posttraumatic headache and neck pain after fall off steps. EXAM: CT HEAD WITHOUT CONTRAST CT CERVICAL SPINE WITHOUT CONTRAST TECHNIQUE: Multidetector CT imaging of the head and cervical spine was performed following the standard protocol without intravenous  contrast. Multiplanar CT image reconstructions of the cervical spine were also generated. COMPARISON:  CT scan of December 09, 2014. FINDINGS: CT HEAD FINDINGS Brain: Mild diffuse cortical atrophy is noted. Mild chronic ischemic white matter disease is noted. No mass effect or midline shift is noted. Ventricular size is within normal limits. There is no evidence of mass lesion, hemorrhage or acute infarction. Vascular: Atherosclerosis of internal carotid arteries is noted. Skull: Bony calvarium appears intact. Sinuses/Orbits: Visualized paranasal sinuses appear normal. Other: None. CT CERVICAL SPINE FINDINGS Alignment: Normal. Skull base and vertebrae: Moderately displaced fracture is seen involving the posterior spinous process of C5. No other fracture is noted. Soft tissues and spinal canal: No definite soft tissue abnormality is noted. Disc levels: Mild degenerative disc disease is noted at C6-7 with anterior and posterior osteophyte formation. Fusion of the left-sided posterior facet joints is noted at C2-3, C3-4, and C4-5. Upper chest: Visualized lung fields appear normal. Other: Atherosclerosis of thoracic aorta is noted. IMPRESSION: Mild diffuse cortical atrophy. Mild chronic ischemic white matter disease. No acute intracranial abnormality seen. Mild degenerative disc disease is noted at C6-7. Other degenerative changes are noted.  Moderately displaced fracture is seen involving the posterior spinous process of C5. Critical Value/emergent results were called by telephone at the time of interpretation on 12/17/2015 at 9:08 am to Dr. Leo Grosser , who verbally acknowledged these results. Electronically Signed   By: Marijo Conception, M.D.   On: 12/17/2015 09:11   Ct Cervical Spine Wo Contrast  Result Date: 12/17/2015 CLINICAL DATA:  Posttraumatic headache and neck pain after fall off steps. EXAM: CT HEAD WITHOUT CONTRAST CT CERVICAL SPINE WITHOUT CONTRAST TECHNIQUE: Multidetector CT imaging of the head and cervical spine was performed following the standard protocol without intravenous contrast. Multiplanar CT image reconstructions of the cervical spine were also generated. COMPARISON:  CT scan of December 09, 2014. FINDINGS: CT HEAD FINDINGS Brain: Mild diffuse cortical atrophy is noted. Mild chronic ischemic white matter disease is noted. No mass effect or midline shift is noted. Ventricular size is within normal limits. There is no evidence of mass lesion, hemorrhage or acute infarction. Vascular: Atherosclerosis of internal carotid arteries is noted. Skull: Bony calvarium appears intact. Sinuses/Orbits: Visualized paranasal sinuses appear normal. Other: None. CT CERVICAL SPINE FINDINGS Alignment: Normal. Skull base and vertebrae: Moderately displaced fracture is seen involving the posterior spinous process of C5. No other fracture is noted. Soft tissues and spinal canal: No definite soft tissue abnormality is noted. Disc levels: Mild degenerative disc disease is noted at C6-7 with anterior and posterior osteophyte formation. Fusion of the left-sided posterior facet joints is noted at C2-3, C3-4, and C4-5. Upper chest: Visualized lung fields appear normal. Other: Atherosclerosis of thoracic aorta is noted. IMPRESSION: Mild diffuse cortical atrophy. Mild chronic ischemic white matter disease. No acute intracranial abnormality seen. Mild  degenerative disc disease is noted at C6-7. Other degenerative changes are noted. Moderately displaced fracture is seen involving the posterior spinous process of C5. Critical Value/emergent results were called by telephone at the time of interpretation on 12/17/2015 at 9:08 am to Dr. Leo Grosser , who verbally acknowledged these results. Electronically Signed   By: Marijo Conception, M.D.   On: 12/17/2015 09:11   Dg Shoulder Left  Result Date: 12/17/2015 CLINICAL DATA:  Status post fall down stairs this morning with a left shoulder injury. Pain. Initial encounter. EXAM: LEFT SHOULDER - 2+ VIEW COMPARISON:  None. FINDINGS: No acute bony or joint abnormality is identified.  Mild to moderate acromioclavicular osteoarthritis is noted. Imaged left lung and ribs are unremarkable. IMPRESSION: No acute finding. Electronically Signed   By: Inge Rise M.D.   On: 12/17/2015 08:43   Ct Orbits Wo Contrast  Result Date: 11/24/2015 CLINICAL DATA:  Blurred vision and diplopia. EXAM: CT ORBITS WITHOUT CONTRAST TECHNIQUE: Multidetector CT imaging of the orbits was performed following the standard protocol without intravenous contrast. COMPARISON:  MRI 05/21/2014 FINDINGS: Orbits: Both globes appear normal. Previous bilateral cataract surgery. Both optic nerves appear normal. Extra-ocular muscles are symmetric and normal. Intraorbital fat is symmetric and normal. Orbital vascular structures are normal. Orbital apices are normal. Visualized sinuses: Frontal, ethmoid, sphenoid and maxillary sinuses are clear except for a few opacified ethmoid air cells on the left, probably not significant. Soft tissues: Negative Limited intracranial: Age related atrophy. IMPRESSION: No abnormal orbital finding by CT. No cause of the presenting symptoms is identified. Electronically Signed   By: Nelson Chimes M.D.   On: 11/24/2015 14:31    Microbiology: No results found for this or any previous visit (from the past 240 hour(s)).    Labs: Basic Metabolic Panel:  Recent Labs Lab 12/17/15 0738 12/18/15 0316  NA 136 137  K 3.5 3.7  CL 100* 102  CO2 28 27  GLUCOSE 181* 132*  BUN 11 8  CREATININE 0.67 0.59  CALCIUM 9.2 9.2   Liver Function Tests: No results for input(s): AST, ALT, ALKPHOS, BILITOT, PROT, ALBUMIN in the last 168 hours. No results for input(s): LIPASE, AMYLASE in the last 168 hours. No results for input(s): AMMONIA in the last 168 hours. CBC:  Recent Labs Lab 12/17/15 0738 12/18/15 0316  WBC 17.4* 9.2  NEUTROABS 15.4*  --   HGB 14.0 13.4  HCT 42.4 40.4  MCV 88.1 87.4  PLT 258 264   Cardiac Enzymes: No results for input(s): CKTOTAL, CKMB, CKMBINDEX, TROPONINI in the last 168 hours. BNP: BNP (last 3 results) No results for input(s): BNP in the last 8760 hours.  ProBNP (last 3 results) No results for input(s): PROBNP in the last 8760 hours.  CBG: No results for input(s): GLUCAP in the last 168 hours.

## 2015-12-20 NOTE — Discharge Instructions (Signed)

## 2015-12-21 ENCOUNTER — Inpatient Hospital Stay (HOSPITAL_COMMUNITY): Payer: Medicare Other | Admitting: Speech Pathology

## 2015-12-21 ENCOUNTER — Inpatient Hospital Stay (HOSPITAL_COMMUNITY): Payer: Medicare Other | Admitting: Physical Therapy

## 2015-12-21 ENCOUNTER — Inpatient Hospital Stay (HOSPITAL_COMMUNITY): Payer: Medicare Other | Admitting: Occupational Therapy

## 2015-12-21 DIAGNOSIS — E876 Hypokalemia: Secondary | ICD-10-CM

## 2015-12-21 LAB — COMPREHENSIVE METABOLIC PANEL
ALBUMIN: 3.1 g/dL — AB (ref 3.5–5.0)
ALT: 65 U/L — ABNORMAL HIGH (ref 14–54)
ANION GAP: 10 (ref 5–15)
AST: 39 U/L (ref 15–41)
Alkaline Phosphatase: 91 U/L (ref 38–126)
BUN: 11 mg/dL (ref 6–20)
CHLORIDE: 100 mmol/L — AB (ref 101–111)
CO2: 28 mmol/L (ref 22–32)
Calcium: 9.1 mg/dL (ref 8.9–10.3)
Creatinine, Ser: 0.62 mg/dL (ref 0.44–1.00)
GFR calc non Af Amer: >60 mL/min (ref >60)
GLUCOSE: 134 mg/dL — AB (ref 65–99)
Potassium: 3.3 mmol/L — ABNORMAL LOW (ref 3.5–5.1)
SODIUM: 138 mmol/L (ref 135–145)
Total Bilirubin: 1.3 mg/dL — ABNORMAL HIGH (ref 0.3–1.2)
Total Protein: 6 g/dL — ABNORMAL LOW (ref 6.5–8.1)

## 2015-12-21 LAB — PROTIME-INR
INR: 2.77
Prothrombin Time: 29.9 seconds — ABNORMAL HIGH (ref 11.4–15.2)

## 2015-12-21 LAB — CBC WITH DIFFERENTIAL/PLATELET
BASOS PCT: 0 %
Basophils Absolute: 0 10*3/uL (ref 0.0–0.1)
EOS ABS: 0.2 10*3/uL (ref 0.0–0.7)
EOS PCT: 2 %
HCT: 39.1 % (ref 36.0–46.0)
HEMOGLOBIN: 13.2 g/dL (ref 12.0–15.0)
LYMPHS ABS: 2.4 10*3/uL (ref 0.7–4.0)
Lymphocytes Relative: 24 %
MCH: 29.3 pg (ref 26.0–34.0)
MCHC: 33.8 g/dL (ref 30.0–36.0)
MCV: 86.9 fL (ref 78.0–100.0)
MONOS PCT: 10 %
Monocytes Absolute: 1 10*3/uL (ref 0.1–1.0)
NEUTROS PCT: 64 %
Neutro Abs: 6.5 10*3/uL (ref 1.7–7.7)
PLATELETS: 321 10*3/uL (ref 150–400)
RBC: 4.5 MIL/uL (ref 3.87–5.11)
RDW: 13.1 % (ref 11.5–15.5)
WBC: 10.1 10*3/uL (ref 4.0–10.5)

## 2015-12-21 MED ORDER — POTASSIUM CHLORIDE CRYS ER 20 MEQ PO TBCR
20.0000 meq | EXTENDED_RELEASE_TABLET | Freq: Every day | ORAL | Status: DC
  Administered 2015-12-21 – 2016-01-01 (×12): 20 meq via ORAL
  Filled 2015-12-21 (×11): qty 1

## 2015-12-21 MED ORDER — WARFARIN SODIUM 3 MG PO TABS
3.0000 mg | ORAL_TABLET | Freq: Once | ORAL | Status: AC
  Administered 2015-12-21: 3 mg via ORAL
  Filled 2015-12-21: qty 1

## 2015-12-21 MED ORDER — METHOCARBAMOL 500 MG PO TABS
500.0000 mg | ORAL_TABLET | Freq: Four times a day (QID) | ORAL | Status: DC | PRN
  Administered 2015-12-21 – 2015-12-31 (×8): 500 mg via ORAL
  Filled 2015-12-21 (×8): qty 1

## 2015-12-21 NOTE — Progress Notes (Signed)
Gerlean Ren Rehab Admission Coordinator Signed Physical Medicine and Rehabilitation  PMR Pre-admission Date of Service: 12/20/2015 2:23 PM  Related encounter: ED to Hosp-Admission (Discharged) from 12/17/2015 in [place]       [] Hide copied text PMR Admission Coordinator Pre-Admission Assessment  Patient: [name] [name] is an 70 y.o., female MRN: [id number] DOB: 09/22/1945 Height: 5\' 6"  (167.6 cm) Weight: 88.5 kg (195 lb)                                                                                                                                                  Insurance Information HMO:     PPO:      PCP:      IPA:      80/20:      OTHER:  PRIMARY:  Medicare A and B       Policy#:  [id number]      Subscriber:  self CM Name:        Phone#:       Fax#:   Pre-Cert#:        Employer:  Full time employment with [employer] Benefits:  Phone #:      Name:  Eff. Date:  Part A 03/06/08; Part B 08/04/13     Deduct:  $1316      Out of Pocket Max:  n/a      Life Max:  n/a CIR:  100%      SNF:  100% first 20 days Outpatient:  80%     Co-Pay: 20% Home Health:  100%      Co-Pay:  DME:  80%     Co-Pay:  20% Providers:  Pt. choice SECONDARY:  [employer]      Policy#:  [id number]      Subscriber:  self CM Name:        Phone#:      Fax#:  Pre-Cert#:       Employer:  Benefits:  Phone #:      Name:  Eff. Date:      Deduct:       Out of Pocket Max:       Life Max:  CIR:       SNF:  Outpatient:      Co-Pay:  Home Health:       Co-Pay:  DME:      Co-Pay:   Medicaid Application Date:       Case Manager:  Disability Application Date:       Case Worker:   Emergency Contact Information        Contact Information    Name Relation Home Work Mobile   [place] Daughter [phone]  [phone]   [place], [place] [phone]       Current Medical History  Patient Admitting Diagnosis: 70 yo female with Parkinson's disease  s/p C5 Fracture  after fall History of Present Illness: [name] [name] a 70 y.o.right handed femalewith history of fibromyalgia, hypercoagulable disorder and history of pulmonary emboli on chronic Coumadin, Parkinson's disease with gait disturbancefollowed by Dr. [name]. Per chart review patient lives alone. Multilevel with bedroom on main floor. Patient used a rolling walker prior to admission.She has a son and daughter in the area question assist.son-in-law is a local occupational therapist. Patient with reported 7 falls in the past 2 years. Presented 12/17/2015 after a fall with significant neck pain radiating to the shoulders. Cranial CT negative for acute changes. CT cervical spine showed moderately displaced fractures involving the posterior spinous process of C5. Conservative care recommended placed in a cervical collar. Hospital course pain management.Intermittent bouts of confusion cranial CT scan 2 negative for acute changes.Chronic Coumadin ongoing for pulmonary emboli. Physical and occupational therapy evaluations completed 12/19/2015 with recommendations of physical medicine rehabilitation consult.Patient was admitted for a comprehensive rehabilitation program  NIH Total: 1    Past Medical History      Past Medical History:  Diagnosis Date  . Arthritis   . Chronic insomnia   . Complication of anesthesia    severe itching night of surgery requiring meds- also couldnt swallow- throat "paralyzed""  . Fibromyalgia   . Glaucoma   . Hypercholesterolemia   . Hyperlipidemia   . Hypertension    PCP Dr Cari Caraway- [place]  05/15/11 with clearance and note on chart,    chest x ray, EKG 12/12 [facility], eccho 7/11 [facility]  . Hypothyroidism    s/p graves disease  . Kidney stone   . PD (Parkinson's disease) ([place])   . Peripheral vascular disease ([place])    PULMONARY EMBOLUS x 2- 2011, 2012/ FOLLOWED BY DR [id number] 12/12 EPIC   PT STAES WILL STOP COUMADIN 3/12 and begin  LOVONOX 05/17/11 as previously instructed  . Sinus infection    at present- dr Honor Loh PA aware- was seen there and at PCP 05/10/11  . Thyroid disease    Hypothyroidism    Family History  family history includes Prostate cancer in her father; Stroke in her father.  Prior Rehab/Hospitalizations:  Has the patient had major surgery during 100 days prior to admission? No  Current Medications   Current Facility-Administered Medications:  .  0.9 %  sodium chloride infusion, , Intravenous, Continuous, Samella Parr, NP, Last Rate: 10 mL/hr at 12/17/15 1830 .  acetaminophen (TYLENOL) tablet 650 mg, 650 mg, Oral, Q6H PRN **OR** acetaminophen (TYLENOL) suppository 650 mg, 650 mg, Rectal, Q6H PRN, Samella Parr, NP .  aspirin EC tablet 81 mg, 81 mg, Oral, QPC breakfast, Samella Parr, NP, 81 mg at 12/20/15 0926 .  atorvastatin (LIPITOR) tablet 10 mg, 10 mg, Oral, Daily, Samella Parr, NP, 10 mg at 12/20/15 0926 .  azelastine (ASTELIN) 0.1 % nasal spray 1 spray, 1 spray, Each Nare, BID, Samella Parr, NP, 1 spray at 12/20/15 0926 .  bisacodyl (DULCOLAX) suppository 10 mg, 10 mg, Rectal, Once, Robbie Lis, MD .  Carbidopa-Levodopa ER (SINEMET CR) 25-100 MG tablet controlled release 1 tablet, 1 tablet, Oral, TID, Elwin Mocha, MD, 1 tablet at 12/20/15 0840 .  cycloSPORINE (RESTASIS) 0.05 % ophthalmic emulsion 1 drop, 1 drop, Both Eyes, BID, Samella Parr, NP, 1 drop at 12/20/15 0927 .  DULoxetine (CYMBALTA) DR capsule 60 mg, 60 mg, Oral, Daily, Samella Parr, NP, 60 mg at 12/20/15 0927 .  valsartan (DIOVAN) 160  MG tablet 160 mg, 160 mg, Oral, Daily, 160 mg at 12/20/15 0928 **AND** hydrochlorothiazide (MICROZIDE) capsule 12.5 mg, 12.5 mg, Oral, Daily, Robbie Lis, MD, 12.5 mg at 12/20/15 0929 .  HYDROmorphone (DILAUDID) injection 1 mg, 1 mg, Subcutaneous, Q4H PRN, Robbie Lis, MD .  levothyroxine (SYNTHROID, LEVOTHROID) tablet 150 mcg, 150 mcg, Oral, QAC breakfast, Samella Parr, NP, 150 mcg at 12/20/15 0840 .  magnesium oxide (MAG-OX) tablet 200 mg, 200 mg, Oral, Daily, Elwin Mocha, MD, 200 mg at 12/20/15 [id number] .  multivitamin with minerals tablet 1 tablet, 1 tablet, Oral, Daily, Samella Parr, NP, 1 tablet at 12/20/15 [phone] .  omega-3 acid ethyl esters (LOVAZA) capsule 1 g, 1 g, Oral, Daily, Samella Parr, NP, 1 g at 12/20/15 0928 .  oxyCODONE (Oxy IR/ROXICODONE) immediate release tablet 5-10 mg, 5-10 mg, Oral, Q4H PRN, Samella Parr, NP, 10 mg at 12/20/15 0847 .  senna (SENOKOT) tablet 8.6 mg, 1 tablet, Oral, Daily, Robbie Lis, MD .  warfarin (COUMADIN) tablet 3 mg, 3 mg, Oral, ONCE-1800, Romona Curls, [facility] .  Warfarin - Pharmacist Dosing Inpatient, , Does not apply, q1800, Valeda Malm Rumbarger, RPH .  zolpidem (AMBIEN) tablet 5 mg, 5 mg, Oral, QHS, Samella Parr, NP, 5 mg at 12/19/15 2143  Patients Current Diet: Diet Heart Room service appropriate? Yes; Fluid consistency: Thin Diet - low sodium heart healthy  Precautions / Restrictions Precautions Precautions: Fall, Cervical Cervical Brace: Hard collar, At all times Restrictions Weight Bearing Restrictions: No   Has the patient had 2 or more falls or a fall with injury in the past year?Yes; pt. fell  October, 2016 with a trip to ED but no serious injury  Prior Activity Level Limited Community (1-2x/wk): Per daughter [name], pt. is out of the home 2-3 times per week visiting the grandchildren, going to MD appointments and grocery shopping/errands  [place] Devices/Equipment: Eyeglasses Home Equipment: Bedside commode, Environmental consultant - 4 wheels, Environmental consultant - 2 wheels  Prior Device Use: Indicate devices/aids used by the patient prior to current illness, exacerbation or injury? Walker  Prior Functional Level Prior Function Level of Independence: Independent with assistive device(s) Comments: Pt still drives. Ambulates in the community with  rollator.  Self Care: Did the patient need help bathing, dressing, using the toilet or eating?  Independent  Indoor Mobility: Did the patient need assistance with walking from room to room (with or without device)? Independent  Stairs: Did the patient need assistance with internal or external stairs (with or without device)? Independent  Functional Cognition: Did the patient need help planning regular tasks such as shopping or remembering to take medications? Independent  Current Functional Level Cognition Overall Cognitive Status: Impaired/Different from baseline Current Attention Level: Selective Orientation Level: Oriented X4 Following Commands: Follows one step commands with increased time, Follows multi-step commands inconsistently Safety/Judgement: Decreased awareness of safety General Comments: Pt appears unwilling to interact verbally during sessions making it difficult to fully assess her cognition.     Extremity Assessment (includes Sensation/Coordination) Upper Extremity Assessment: Generalized weakness  Lower Extremity Assessment: Generalized weakness   ADLs Overall ADL's : Needs assistance/impaired Eating/Feeding: Moderate assistance, Bed level Grooming: Wash/dry face, Wash/dry hands, Set up, Supervision/safety (sitting in recliner) Upper Body Bathing: Moderate assistance, Sitting (in recliner) Lower Body Bathing: Total assistance (mod A sit<>stand with 2nd person to help wash) Upper Body Dressing : Total assistance (sitting in recliner) Lower Body Dressing: Total assistance (mod  A sit<>stand with 2nd person to help with clothes) Toilet Transfer: Moderate assistance, Stand-pivot, BSC (with extreme increased time and A for weight shifts) Toileting- Clothing Manipulation and Hygiene: Total assistance (mod A sit<>stand with 2nd person to help wash and to A with clothing)   Mobility Overal bed mobility: Needs Assistance Bed Mobility: Supine to Sit Rolling: Mod  assist Supine to sit: HOB elevated, Mod assist General bed mobility comments: multimodal cues for sequencing. Assist to elevate trunk.   Transfers Overall transfer level: Needs assistance Equipment used: Rolling walker (2 wheeled) Transfers: Sit to/from Stand, [employer] Transfers Sit to Stand: Mod assist, +2 safety/equipment Stand pivot transfers: Mod assist, +2 safety/equipment General transfer comment: Pivot transfer bed->BSC->recliner. Cueing required throughout transition to keep pt on task. Verbal cues and physical assist for anterior weight shift out of heels and into forefeet.   Ambulation / Gait / Stairs / Wheelchair Mobility Ambulation/Gait Ambulation/Gait assistance: Mod assist, +2 safety/equipment Ambulation Distance (Feet): 35 Feet Assistive device: Rolling walker (2 wheeled) Gait Pattern/deviations: Shuffle General Gait Details: Short, shuffle steps noted. Pt able to correct with verbal cueing for 4-5 steps then returns to shuffle pattern. Assist needed during ambulation for anterior translation of weight out of her heels to prevent LOB backwards. Head in forward flexed position and unable to correct with cueing. Gait velocity: decreased Gait velocity interpretation: Below normal speed for age/gender   Posture / Balance Dynamic Sitting Balance Sitting balance - Comments: right lateral and posterior lean Balance Overall balance assessment: Needs assistance Sitting-balance support: Single extremity supported, Feet supported Sitting balance-Leahy Scale: Fair Sitting balance - Comments: right lateral and posterior lean Standing balance support: Bilateral upper extremity supported, During functional activity Standing balance-Leahy Scale: Poor Standing balance comment: retropulsive   Special needs/care consideration BiPAP/CPAP  no CPM  no Continuous Drip IV  no Dialysis  no        Life Vest   no Oxygen  no Special Bed    Trach Size   no Wound Vac (area)   no       Skin  MASD perinium per nursing notes; ecchymosis left lateral arm                                 Bowel mgmt:  Last BM 12/14/15 Bladder mgmt: urinary incontinence Diabetic mgmt no    Previous Home Environment Living Arrangements: Alone Available Help at Discharge: Family, Available PRN/intermittently Type of Home: House Home Layout: Multi-level, Bed/bath upstairs, Able to live on main level with bedroom/bathroom Alternate Level Stairs-Rails: Left Alternate Level Stairs-Number of Steps: 13 Home Access: Stairs to enter Entrance Stairs-Rails: Right, Left, Can reach both Entrance Stairs-Number of Steps: "several" Bathroom Toilet: Standard Home Care Services: No Additional Comments: Pt has rollator on main level and RW upstairs.  Discharge Living Setting Plans for Discharge Living Setting: Other (Comment) (pt. will Dc to daughter and son in [place] home) Type of Home at Discharge: House Discharge Home Layout: One level Discharge Home Access: Stairs to enter Entrance Stairs-Number of Steps: 1 Discharge Bathroom Shower/Tub: Walk-in shower Discharge Bathroom Toilet: Standard Discharge Bathroom Accessibility: Yes How Accessible: Accessible via wheelchair Does the patient have any problems obtaining your medications?: No  Social/Family/Support Systems Patient Roles: Parent (grandparent) Anticipated Caregiver: Pt's daughter, [place] May is primary caregiver; pt's son in law is an OT; pt's son travels and will be available on a more limited basis Anticipated Caregiver's Contact Information: [name]  Jack Quarto , daughter , [phone] Ability/Limitations of Caregiver: [name] works part time from home as an RNCM.  Monica and husband have 3 children, one of whom has special needs Caregiver Availability: Intermittent Discharge Plan Discussed with Primary Caregiver: Yes Is Caregiver In Agreement with Plan?: Yes Does Caregiver/Family have Issues with Lodging/Transportation while Pt is in Rehab?:  Yes   Goals/Additional Needs Patient/Family Goal for Rehab: supervision and min assist PT/OT, supervision SLP Expected length of stay: 13-18 days Cultural Considerations: none Dietary Needs: heart healthy, thin liquids Equipment Needs: TBA Additional Information: Pt's daughter states she is attmepted to get her mom to move out of her inaccessible house about a year ago. but pt. was resistant.  [name] states she iw to meet with a realtor on Friday to put mom's house on the market so pt. can come and live with daughter and son in law in an accessible home.   Pt/Family Agrees to Admission and willing to participate: Yes Program Orientation Provided & Reviewed with Pt/Caregiver Including Roles  & Responsibilities: Yes   Decrease burden of Care through IP rehab admission: n/a   Possible need for SNF placement upon discharge: not anticipated   Patient Condition: This patient's condition remains as documented in the consult dated 12/20/15 , in which the Rehabilitation Physician determined and documented that the patient's condition is appropriate for intensive rehabilitative care in an inpatient rehabilitation facility. Will admit to inpatient rehab today.   Preadmission Screen Completed By:  Gerlean Ren, 12/20/2015 2:35 PM ______________________________________________________________________   Discussed status with Dr.  Naaman Plummer on 12/20/15 at  1435  and received telephone approval for admission today.  Admission Coordinator:  Gerlean Ren, time [id number] Sudie Grumbling 12/20/15       Cosigned by: [name], MD at 12/20/2015 3:04 PM  Revision History

## 2015-12-21 NOTE — Progress Notes (Signed)
[name], MD Physician Signed Physical Medicine and Rehabilitation  Consult Note Date of Service: 12/20/2015 5:57 AM  Related encounter: ED to Hosp-Admission (Discharged) from 12/17/2015 in [place] All Collapse All   [] Hide copied text [] Hover for attribution information      Physical Medicine and Rehabilitation Consult Reason for Consult: Displaced C5 fracture after fall Referring Physician: Charlies Silvers   HPI: [name] [name] is a 70 y.o. right handed female with history of fibromyalgia, hypercoagulable disorder and history of pulmonary emboli on chronic Coumadin, Parkinson's disease with gait disturbance followed by Dr. [name] at [facility] neurological. Per chart review patient lives alone. Multilevel with bedroom on main floor. Patient used a rolling walker prior to admission. She has a son and daughter in the area question assist Patient with reported 7 falls in the past 2 years. Presented 12/17/2015 after a fall with significant neck pain radiating to the shoulders. Cranial CT negative for acute changes. CT cervical spine showed moderately displaced fractures involving the posterior spinous process of C5. Conservative care recommended placed in a cervical collar. Hospital course pain management. Chronic Coumadin ongoing for pulmonary emboli. Physical and occupational therapy evaluations completed 12/19/2015 with recommendations of physical medicine rehabilitation consult   Review of Systems  Constitutional: Negative for chills and fever.  HENT: Negative for hearing loss.   Eyes: Positive for blurred vision.  Respiratory: Negative for cough and shortness of breath.   Cardiovascular: Positive for palpitations. Negative for chest pain and leg swelling.  Gastrointestinal: Positive for constipation. Negative for nausea and vomiting.  Genitourinary: Negative for dysuria and hematuria.  Musculoskeletal: Positive for falls.  Skin:  Negative for rash.  Neurological: Positive for dizziness and headaches. Negative for seizures and loss of consciousness.  Psychiatric/Behavioral: The patient has insomnia.   All other systems reviewed and are negative.      Past Medical History:  Diagnosis Date  . Arthritis   . Chronic insomnia   . Complication of anesthesia    severe itching night of surgery requiring meds- also couldnt swallow- throat "paralyzed""  . Fibromyalgia   . Glaucoma   . Hypercholesterolemia   . Hyperlipidemia   . Hypertension    PCP Dr Cari Caraway- [place]  05/15/11 with clearance and note on chart,    chest x ray, EKG 12/12 [facility], eccho 7/11 [facility]  . Hypothyroidism    s/p graves disease  . Kidney stone   . PD (Parkinson's disease) ([place])   . Peripheral vascular disease ([place])    PULMONARY EMBOLUS x 2- 2011, 2012/ FOLLOWED BY DR [id number] 12/12 [facility]   PT [facility]AES WILL STOP COUMADIN 3/12 and begin LOVONOX 05/17/11 as previously instructed  . Sinus infection    at present- dr Honor Loh PA aware- was seen there and at PCP 05/10/11  . Thyroid disease    Hypothyroidism        Past Surgical History:  Procedure Laterality Date  . ABDOMINAL HYSTERECTOMY    . APPENDECTOMY    . CATARACT EXTRACTION Bilateral   . CHOLECYSTECTOMY    . COLONOSCOPY    . EXPLORATORY LAPAROTOMY    . JOINT REPLACEMENT     left knee  7/12  . TOTAL KNEE ARTHROPLASTY  05/22/2011   Procedure: TOTAL KNEE ARTHROPLASTY;  Surgeon: Mauri Pole, MD;  Location: [facility] ORS;  Service: Orthopedics;  Laterality: Right;        Family History  Problem Relation Age of Onset  . Prostate  cancer Father   . Stroke Father    Social History:  reports that she quit smoking about 26 years ago. She smoked 1.00 pack per day. She has never used smokeless tobacco. She reports that she drinks alcohol. She reports that she does not use drugs. Allergies:       Allergies  Allergen Reactions  . Crestor [Rosuvastatin Calcium]  Other (See Comments)    Feeling very bad, aching all over.  . Erythromycin Hives  . Pregabalin Other (See Comments)    Crossed vision.  Marland Kitchen Macrobid [Nitrofurantoin] Hives  . Carbidopa     Headaches, nausea, dizziness  . Gabapentin Other (See Comments)    Blurred vision  . Losartan          Medications Prior to Admission  Medication Sig Dispense Refill  . acetaminophen (TYLENOL) 500 MG tablet Take 500 mg by mouth every 6 (six) hours as needed for mild pain.    Marland Kitchen aspirin 81 MG tablet Take 81 mg by mouth daily after breakfast.     . atorvastatin (LIPITOR) 10 MG tablet Take 10 mg by mouth daily.    Marland Kitchen azelastine (ASTELIN) 0.1 % nasal spray Place 1 spray into both nostrils 2 (two) times daily. Use in each nostril as directed    . Calcium Carbonate-Vitamin D (CALCIUM + D PO) Take 2 tablets by mouth at bedtime. 600-200 mg tab.    . Carbidopa-Levodopa ER (RYTARY) 23.75-95 MG CPCR Take 1 tablet by mouth 3 (three) times daily. 100 capsule 0  . Cholecalciferol (VITAMIN D3) 5000 UNITS CAPS Take 5,000 Units by mouth daily.     . cycloSPORINE (RESTASIS) 0.05 % ophthalmic emulsion Place 1 drop into both eyes daily as needed.    . DULoxetine (CYMBALTA) 60 MG capsule Take 1 capsule (60 mg total) by mouth daily. 30 capsule 5  . Magnesium 250 MG TABS Take 1 tablet by mouth at bedtime.     . Multiple Vitamin (MULTIVITAMIN) tablet Take 1 tablet by mouth daily.     . Omega-3 Fatty Acids (FISH OIL) 1200 MG CAPS Take 2 capsules by mouth at bedtime.     Marland Kitchen SYNTHROID 150 MCG tablet Take 150 mcg by mouth daily before breakfast.     . telmisartan-hydrochlorothiazide (MICARDIS HCT) 40-12.5 MG per tablet Take 1 tablet by mouth daily.   1  . vitamin B-12 (CYANOCOBALAMIN) 1000 MCG tablet Take 1,000 mcg by mouth daily.    Marland Kitchen warfarin (COUMADIN) 4 MG tablet Take 4 mg by mouth daily.    Marland Kitchen zolpidem (AMBIEN) 10 MG tablet Take 1 tablet (10 mg total) by mouth at bedtime. 30 tablet 2  .  DULoxetine (CYMBALTA) 60 MG capsule Take 1 capsule (60 mg total) by mouth daily. (Patient not taking: Reported on 12/17/2015) 90 capsule 0    Home: Home Living Family/patient expects to be discharged to:: Skilled nursing facility Living Arrangements: Alone Available Help at Discharge: Family, Available PRN/intermittently Type of Home: House Home Access: Stairs to enter [employer] of Steps: "several" Entrance Stairs-Rails: Right, Left, Can reach both Home Layout: Multi-level, Bed/bath upstairs, Able to live on main level with bedroom/bathroom Alternate Level Stairs-Number of Steps: 13 Alternate Level Stairs-Rails: Left Bathroom Toilet: Standard Home Equipment: Bedside commode, Walker - 4 wheels, Walker - 2 wheels Additional Comments: Pt has rollator on main level and RW upstairs.  Functional History: Prior Function Level of Independence: Independent with assistive device(s) Comments: Pt still drives. Ambulates in the community with rollator. Functional Status:  Mobility:  Bed Mobility Overal bed mobility: Needs Assistance Bed Mobility: Supine to Sit Rolling: Mod assist Supine to sit: HOB elevated, Max assist General bed mobility comments: Pt up in recliner. Transfers Overall transfer level: Needs assistance Equipment used: Rolling walker (2 wheeled) Transfers: Sit to/from Stand Sit to Stand: Max assist Stand pivot transfers: Mod assist General transfer comment: verbal cues for hand placement, assist with anterior weight shift to power up. Pt retropulsive with stance. Mod assist required to maintain standing. Pt able to stand x 3 trials. Ambulation/Gait General Gait Details: unable to progress LE for gait    ADL: ADL Overall ADL's : Needs assistance/impaired Eating/Feeding: Moderate assistance, Bed level Grooming: Wash/dry face, Wash/dry hands, Set up, Supervision/safety (sitting in recliner) Upper Body Bathing: Moderate assistance, Sitting (in  recliner) Lower Body Bathing: Total assistance (mod A sit<>stand with 2nd person to help wash) Upper Body Dressing : Total assistance (sitting in recliner) Lower Body Dressing: Total assistance (mod A sit<>stand with 2nd person to help with clothes) Toilet Transfer: Moderate assistance, Stand-pivot, BSC (with extreme increased time and A for weight shifts) Toileting- Clothing Manipulation and Hygiene: Total assistance (mod A sit<>stand with 2nd person to help wash and to A with clothing)  Cognition: Cognition Overall Cognitive Status: Impaired/Different from baseline Orientation Level: Oriented X4 Cognition Arousal/Alertness: Awake/alert Behavior During Therapy: WFL for tasks assessed/performed Overall Cognitive Status: Impaired/Different from baseline Area of Impairment: Orientation, Attention, Memory, Following commands, Safety/judgement, Problem solving Orientation Level: Disoriented to, Situation, Time Current Attention Level: Selective Memory: Decreased recall of precautions, Decreased short-term memory Following Commands: Follows one step commands with increased time, Follows multi-step commands inconsistently Safety/Judgement: Decreased awareness of safety, Decreased awareness of deficits Problem Solving: Slow processing, Decreased initiation, Difficulty sequencing, Requires verbal cues, Requires tactile cues  Blood pressure (!) 126/54, pulse 77, temperature 97.9 F (36.6 C), temperature source Oral, resp. rate 20, height 5\' 6"  (1.676 m), weight 88.5 kg (195 lb), SpO2 96 %. Physical Exam  HENT:  Head: Normocephalic.  Eyes: EOM are normal.  Cardiovascular: Normal rate and regular rhythm.   Respiratory: Effort normal and breath sounds normal. No respiratory distress.  GI: Soft. Bowel sounds are normal. She exhibits no distension.  Neurological:  Patient's eyes are open. Makes eye contact. Does not initiate conversation. Does answer when cued. Moves all 4's without sign of  focal weakness. Do not see focal sensory loss either. Processing extremely delayed. Limited awareness  Skin: Skin is warm and dry.  Psychiatric:  Flat, disengaged    Lab Results Last 24 Hours  No results found for this or any previous visit (from the past 24 hour(s)).   Imaging Results (Last 48 hours)  No results found.    Assessment/Plan: Diagnosis: 70 yo female with Parkinson's disease s/p C5 Fracture after fall 1. Does the need for close, 24 hr/day medical supervision in concert with the patient's rehab needs make it unreasonable for this patient to be served in a less intensive setting? Yes and Potentially 2. Co-Morbidities requiring supervision/potential complications: chronic low back pain, FMS 3. Due to bladder management, bowel management, safety, skin/wound care, disease management, medication administration, pain management and patient education, does the patient require 24 hr/day rehab nursing? Yes 4. Does the patient require coordinated care of a physician, rehab nurse, PT (1-2 hrs/day, 5 days/week) and OT (1-2 hrs/day, 5 days/week) to address physical and functional deficits in the context of the above medical diagnosis(es)? Yes Addressing deficits in the following areas: balance, endurance, locomotion, strength, transferring, bowel/bladder control, bathing,  dressing, feeding, grooming, toileting and psychosocial support 5. Can the patient actively participate in an intensive therapy program of at least 3 hrs of therapy per day at least 5 days per week? Yes 6. The potential for patient to make measurable gains while on inpatient rehab is excellent 7. Anticipated functional outcomes upon discharge from inpatient rehab are supervision and min assist  with PT, supervision and min assist with OT, TBD? with SLP. 8. Estimated rehab length of stay to reach the above functional goals is: 13-18 days? 9. Does the patient have adequate social supports and living environment to  accommodate these discharge functional goals? Yes and Potentially 10. Anticipated D/C setting: Home 11. Anticipated post D/C treatments: [place] therapy 12. Overall Rehab/Functional Prognosis: excellent  RECOMMENDATIONS: This patient's condition is appropriate for continued rehabilitative care in the following setting: CIR Patient has agreed to participate in recommended program. Potentially Note that insurance prior authorization may be required for reimbursement for recommended care.  Comment: Rehab Admissions Coordinator to follow up.  Thanks,  [name], MD, [facility]     12/20/2015    Revision History                   Routing History

## 2015-12-21 NOTE — Progress Notes (Signed)
ANTICOAGULATION CONSULT NOTE - Initial Consult  Pharmacy Consult for Coumadin Indication: history of PE  Allergies  Allergen Reactions  . Crestor [Rosuvastatin Calcium] Other (See Comments)    Feeling very bad, aching all over.  . Erythromycin Hives  . Gabapentin Other (See Comments)    Blurred vision  . Pregabalin Other (See Comments)    Crossed vision.  . Carbidopa Other (See Comments)    Headaches, nausea, dizziness  . Macrobid [Nitrofurantoin] Hives  . Losartan Other (See Comments)    Patient Measurements: Height: 5\' 6"  (167.6 cm) Weight: 194 lb 1.6 oz (88 kg) IBW/kg (Calculated) : 59.3  Vital Signs: Temp: 98 F (36.7 C) (10/17 0415) Temp Source: Oral (10/17 0415) BP: 132/61 (10/17 0415) Pulse Rate: 74 (10/17 0415)  Labs:  Recent Labs  12/19/15 0219 12/20/15 0737 12/21/15 0630  HGB  --   --  13.2  HCT  --   --  39.1  PLT  --   --  321  LABPROT 32.9* 30.2* 29.9*  INR 3.13 2.81 2.77  CREATININE  --   --  0.62    Estimated Creatinine Clearance: 71 mL/min (by C-G formula based on SCr of 0.62 mg/dL).   Assessment: 48 YOF on  chronic warfarin for hypercoaguable state.  INR down this AM to 2.77, therapeutic, hgb 13.2, pltc 321 K. Stable.   PTA dose is 4mg  daily. INR 10/11 in clinic was 3.0.   Goal of Therapy:  INR 2-3 Monitor platelets by anticoagulation protocol: Yes   Plan:  Warfarin 3 mg PO x1 Daily INR Monitor CBC, s/sx bleeding, PO intake  Maryanna Shape, PharmD, BCPS  Clinical Pharmacist  Pager: [phone]

## 2015-12-21 NOTE — Progress Notes (Signed)
Patient information reviewed and entered into eRehab system by Cayenne Breault, RN, CRRN, PPS Coordinator.  Information including medical coding and functional independence measure will be reviewed and updated through discharge.     Per nursing patient was given "Data Collection Information Summary for Patients in Inpatient Rehabilitation Facilities with attached "Privacy Act Statement-Health Care Records" upon admission.  

## 2015-12-21 NOTE — Patient Care Conference (Signed)
Inpatient RehabilitationTeam Conference and Plan of Care Update Date: 12/21/2015   Time: 2:20 PM    Patient Name: [name] [name]      Medical Record Number: [id number]  Date of Birth: August 14, 1945 Sex: Female         Room/Bed: 4W20C/4W20C-01 Payor Info: Payor: MEDICARE / Plan: MEDICARE PART A AND B / Product Type: *No Product type* /    Admitting Diagnosis: C5 Fx  Admit Date/Time:  12/20/2015  6:22 PM Admission Comments: No comment available   Primary Diagnosis:  <principal problem not specified> Principal Problem: <principal problem not specified>  Patient Active Problem List   Diagnosis Date Noted  . Intractable pain 12/17/2015  . Displaced fracture of fifth cervical vertebra ([place]) 12/17/2015  . Gait disturbance 12/17/2015  . Trochanteric bursitis of both hips 09/13/2015  . Chronic low back pain 06/07/2015  . Fibromyalgia syndrome 05/17/2015  . Hypothyroidism 05/17/2015  . Parkinson's disease ([place]) 05/17/2015  . Hypercoagulable state ([place]) 02/07/2012  . History of pulmonary embolism 02/07/2012  . S/P right TKA 05/22/2011    Expected Discharge Date: Expected Discharge Date: 01/01/16  Team Members Present: Physician leading conference: Dr. Alger Simons Social Worker Present: Lennart Pall, LCSW Nurse Present: Heather Roberts, RN PT Present: Kem Parkinson, PT OT Present: Willeen Cass, OT SLP Present: Weston Anna, SLP PPS Coordinator present : Daiva Nakayama, RN, CRRN     Current Status/Progress Goal Weekly Team Focus  Medical   C5 fx after fall. hx of PD. baseline balance issue  improve pain, avoid cognitive setbacks due to analgesia  pain control, nutrition   Bowel/Bladder   Occasional incontinence urine due to urgency  Continent, no retention, no S/S UTI  Timed toileting    Swallow/Nutrition/ Hydration             ADL's   Mod A for bed mobility; min to mod A for ADL tasks, decr standing balance, incr pain and decr tolerance to actiivty, decr recall and memory   supervision   cognitive remediation, actiivty tolerance, pain management, standing balance,   Mobility   modA bed mobility, minA sit <>stand, gait x25' with RW, decr recall and problem solving, incr pain  S overall, minA stairs  activity tolerance, pain management, gait training, transfers   Communication             Safety/Cognition/ Behavioral Observations  Cognitive Eval today, Overall Min-Mod A  Supervision  complex problem solving, recall and attention    Pain   Neck, shoulders.  Taking Oxy IR Q 4-6 hrs.  Managed at goal 3/10.  Utilize alternative methods of pain control; K-pad, Robaxin ordered PRN, repositioning; monitor effectiveness.   Skin   Scattered bruising, mild MASD peri-area.  No s/s infection, ecchymosis resolving at D/C.  Monitor, timed toileting to reduce incontinence, MASD.    Rehab Goals Patient on target to meet rehab goals: Yes *See Care Plan and progress notes for long and short-term goals.  Barriers to Discharge: premorbid balance issues related to PD    Possible Resolutions to Barriers:  continued NMR,equipment training, caregiver ed    Discharge Planning/Teaching Needs:  Home with intermittent assistance from daughter and son-in-law and son.  Teaching is ongoing.   Team Discussion:  New eval.  Must be caregul with pain meds as they will easily affect her cognition.  Goals set for overall supervision.  SW questioned if family will be able to provide this.  ST addressing higher level cognition.    Revisions to Treatment Plan:  None   Continued Need for Acute Rehabilitation Level of Care: The patient requires daily medical management by a physician with specialized training in physical medicine and rehabilitation for the following conditions: Daily direction of a multidisciplinary physical rehabilitation program to ensure safe treatment while eliciting the highest outcome that is of practical value to the patient.: Yes Daily medical management of patient  stability for increased activity during participation in an intensive rehabilitation regime.: Yes Daily analysis of laboratory values and/or radiology reports with any subsequent need for medication adjustment of medical intervention for : Neurological problems;Post surgical problems  Teriah Muela 12/22/2015, 10:27 AM

## 2015-12-21 NOTE — Progress Notes (Signed)
[facility] PHYSICAL MEDICINE & REHABILITATION     PROGRESS NOTE    Subjective/Complaints: Didn't sleep well. Restless. Had headache. Neck sore too. Waiting for nurse to give her something for pain  ROS: Pt denies fever, rash/itching,  blurred or double vision, nausea, vomiting, abdominal pain, diarrhea, chest pain, shortness of breath, palpitations, dysuria, dizziness, , bleeding, anxiety, or depression   Objective: Vital Signs: Blood pressure 132/61, pulse 74, temperature 98 F (36.7 C), temperature source Oral, resp. rate 20, height 5\' 6"  (1.676 m), weight 88 kg (194 lb 1.6 oz), SpO2 97 %. Ct Head Wo Contrast  Result Date: 12/20/2015 CLINICAL DATA:  Fall earlier today.  Parkinson's disease. EXAM: CT HEAD WITHOUT CONTRAST TECHNIQUE: Contiguous axial images were obtained from the base of the skull through the vertex without intravenous contrast. COMPARISON:  December 17, 2015 FINDINGS: Brain: Mild diffuse atrophy remain stable. There is a cavum septum pellucidum, an anatomic variant. There is no intracranial mass, hemorrhage, extra-axial fluid collection, or midline shift. There is patchy small vessel disease in the centra semiovale bilaterally, stable. There is no new gray-white compartment lesion. No acute infarct is evident. Vascular: No hyperdense vessels are evident. There is calcification in both carotid siphon regions, somewhat more on the right than on the left. Skull: The bony calvarium appears intact. Sinuses/Orbits: There is mucosal thickening in multiple ethmoid air cells bilaterally. There is slight mucosal thickening in the inferior right frontal sinus. Mild mucosal thickening is noted in the visualized right maxillary antrum. Other visualized paranasal sinuses are clear. There is slight rightward deviation of the nasal septum. Orbits appear symmetric bilaterally. Other: Mastoid air cells are clear. IMPRESSION: Stable atrophy with patchy periventricular small vessel disease. No acute  infarct. No intracranial mass, hemorrhage, or extra-axial fluid collection. Foci of arterial vascular calcification noted. Areas of paranasal sinus disease noted. Electronically Signed   By: Lowella Grip III M.D.   On: 12/20/2015 07:11    Recent Labs  12/21/15 0630  WBC 10.1  HGB 13.2  HCT 39.1  PLT 321    Recent Labs  12/21/15 0630  NA 138  K 3.3*  CL 100*  GLUCOSE 134*  BUN 11  CREATININE 0.62  CALCIUM 9.1   CBG (last 3)  No results for input(s): GLUCAP in the last 72 hours.  Wt Readings from Last 3 Encounters:  12/20/15 88 kg (194 lb 1.6 oz)  12/17/15 88.5 kg (195 lb)  11/24/15 89.4 kg (197 lb 3.2 oz)    Physical Exam:   HENT:  Head: Normocephalic.  Eyes: EOMare normal.  Cardiovascular: Normal rateand regular rhythm.  Respiratory: Effort normaland breath sounds normal. No respiratory distress.  GI: Soft. Bowel sounds are normal. She exhibits no distension.  Neurological:  Patient's eyes are open. Makes eye contact. Initiated more today. Masked facies. Bradykinesias.  Moves all 4's without sign of focal weakness. Do not see focal sensory loss either.  Improved awareness today Skin: Skin is warmand dry.  Psychiatric:  Flat, disengaged   Assessment/Plan: 1. Gait and functional deficits secondary to C5 Fx (hx of parkinson's disease) which require 3+ hours per day of interdisciplinary therapy in a comprehensive inpatient rehab setting. Physiatrist is providing close team supervision and 24 hour management of active medical problems listed below. Physiatrist and rehab team continue to assess barriers to discharge/monitor patient progress toward functional and medical goals.  Function:  Bathing Bathing position      Bathing parts      Bathing assist  Upper Body Dressing/Undressing Upper body dressing                    Upper body assist        Lower Body Dressing/Undressing Lower body dressing                                   Lower body assist        Toileting Toileting          Toileting assist     Transfers Chair/bed transfer             Locomotion Ambulation           Wheelchair          Cognition Comprehension    Expression    Social Interaction    Problem Solving    Memory     Medical Problem List and Plan: 1.  Debilitation secondary to C5 fracture after fall as well as history of Parkinson's disease. Cervical collar at all times             -begin therapies today 2.  DVT Prophylaxis/Anticoagulation: Chronic Coumadin for history of pulmonary emboli/hypercoagulable state. Monitor for any bleeding episodes 3. Pain Management: Oxycodone as needed  -observe tolerance of medication  -utilize ice/heat also 4. Mood: Cymbalta 60 mg daily 5. Neuropsych: This patient is capable of making decisions on her own behalf. 6. Skin/Wound Care: Routine skin checks 7. Fluids/Electrolytes/Nutrition: I personally reviewed the patient's labs today.   -replace potassium 8. Parkinson disease. Sinemet 25-100 3 times a day. Followed by Dr. Adline Peals neurological 9. Hypertension. HCTZ 12.5 mg daily, Diovan 160 mg daily. Monitor with increased mobility2 10. Hypothyroidism. Synthroid. TSH 0.61 11. Hyperlipidemia. Lipitor, Lovaza   LOS (Days) 1 A FACE TO FACE EVALUATION WAS PERFORMED  Pooja Camuso T 12/21/2015 9:04 AM

## 2015-12-21 NOTE — Evaluation (Signed)
Occupational Therapy Assessment and Plan  Patient Details  Name: [name] [name] MRN: [id number] Date of Birth: [birth year]  OT Diagnosis: acute pain, cognitive deficits and muscle weakness (generalized) Rehab Potential: Rehab Potential (ACUTE ONLY): Good ELOS: ~2 weeks   Today's Date: 12/21/2015 OT Individual Time: 3893-7342 OT Individual Time Calculation (min): 65 min      Problem List: Patient Active Problem List   Diagnosis Date Noted  . Intractable pain 12/17/2015  . Displaced fracture of fifth cervical vertebra ([place]) 12/17/2015  . Gait disturbance 12/17/2015  . Trochanteric bursitis of both hips 09/13/2015  . Chronic low back pain 06/07/2015  . Fibromyalgia syndrome 05/17/2015  . Hypothyroidism 05/17/2015  . Parkinson's disease ([place]) 05/17/2015  . Hypercoagulable state ([place]) 02/07/2012  . History of pulmonary embolism 02/07/2012  . S/P right TKA 05/22/2011    Past Medical History:  Past Medical History:  Diagnosis Date  . Arthritis   . Chronic insomnia   . Complication of anesthesia    severe itching night of surgery requiring meds- also couldnt swallow- throat "paralyzed""  . Fibromyalgia   . Glaucoma   . Hypercholesterolemia   . Hyperlipidemia   . Hypertension    PCP Dr Cari Caraway- [place]  05/15/11 with clearance and note on chart,    chest x ray, EKG 12/12 [facility], eccho 7/11 [facility]  . Hypothyroidism    s/p graves disease  . Kidney stone   . PD (Parkinson's disease) ([place])   . Peripheral vascular disease ([place])    PULMONARY EMBOLUS x 2- 2011, 2012/ FOLLOWED BY DR [id number] 12/12 [facility]   PT [facility] WILL STOP COUMADIN 3/12 and begin LOVONOX 05/17/11 as previously instructed  . Sinus infection    at present- dr Honor Loh PA aware- was seen there and at PCP 05/10/11  . Thyroid disease    Hypothyroidism   Past Surgical History:  Past Surgical History:  Procedure Laterality Date  . ABDOMINAL HYSTERECTOMY    . APPENDECTOMY    . CATARACT EXTRACTION Bilateral   . CHOLECYSTECTOMY     . COLONOSCOPY    . EXPLORATORY LAPAROTOMY    . JOINT REPLACEMENT     left knee  7/12  . TOTAL KNEE ARTHROPLASTY  05/22/2011   Procedure: TOTAL KNEE ARTHROPLASTY;  Surgeon: Mauri Pole, MD;  Location: [facility];  Service: Orthopedics;  Laterality: Right;    Assessment & Plan Clinical Impression: Patient is a 70 y.o. year old female right handed femalewith history of fibromyalgia, hypercoagulable disorder and history of pulmonary emboli on chronic Coumadin, Parkinson's disease with gait disturbancefollowed by Dr. [name] at [facility]. Per chart review patient lives alone. Multilevel with bedroom on main floor. Patient used a rolling walker prior to admission.She has a son and daughter in the area question assist. son-in-law is a local occupational therapist. Patient with reported 7 falls in the past 2 years. Presented 12/17/2015 after a fall with significant neck pain radiating to the shoulders. Cranial CT negative for acute changes. CT cervical spine showed moderately displaced fractures involving the posterior spinous process of C5. Conservative care recommended placed in a cervical collar. Hospital course pain management.Intermittent bouts of confusion cranial CT scan 2 negative for acute changes. Chronic Coumadin ongoing for pulmonary emboli.   Patient transferred to [facility] on 12/20/2015 .    Patient currently requires mod with basic self-care skills and basic mobility  secondary to muscle weakness, decreased cardiorespiratoy endurance, decreased awareness, decreased problem solving, decreased safety awareness and decreased memory and decreased standing balance, decreased  postural control, decreased balance strategies and difficulty maintaining precautions.  Prior to hospitalization, patient could complete ADL with modified independent  but with multiple falls.  Patient will benefit from skilled intervention to decrease level of assist with basic self-care skills and increase  independence with basic self-care skills prior to discharge home with care partner.  Anticipate patient will require intermittent supervision and follow up home health.  OT - End of Session Activity Tolerance: Tolerates 30+ min activity with multiple rests Endurance Deficit: Yes OT Assessment Rehab Potential (ACUTE ONLY): Good OT Patient demonstrates impairments in the following area(s): Balance;Behavior;Safety;Sensory;Skin Integrity;Cognition;Edema;Endurance;Motor;Nutrition;Pain;Perception OT Basic ADL's Functional Problem(s): Eating;Grooming;Bathing;Dressing;Toileting OT Advanced ADL's Functional Problem(s): Simple Meal Preparation OT Transfers Functional Problem(s): Toilet;Tub/Shower OT Additional Impairment(s): Fuctional Use of Upper Extremity OT Plan OT Intensity: Minimum of 1-2 x/day, 45 to 90 minutes OT Frequency: 5 out of 7 days OT Duration/Estimated Length of Stay: ~2 weeks OT Treatment/Interventions: Balance/vestibular training;Discharge planning;Functional electrical stimulation;Pain management;Self Care/advanced ADL retraining;Therapeutic Activities;UE/LE Coordination activities;Visual/perceptual remediation/compensation;Therapeutic Exercise;Skin care/wound managment;Patient/family education;Functional mobility training;Disease mangement/prevention;Cognitive remediation/compensation;Community reintegration;DME/adaptive equipment instruction;Psychosocial support;Neuromuscular re-education;Splinting/orthotics;UE/LE Strength taining/ROM;Wheelchair propulsion/positioning OT Self Feeding Anticipated Outcome(s): supervision OT Basic Self-Care Anticipated Outcome(s): supervision OT Toileting Anticipated Outcome(s): supervision OT Bathroom Transfers Anticipated Outcome(s): supervision OT Recommendation Recommendations for Other Services: Neuropsych consult;Speech consult Patient destination: Home Equipment Recommended: To be determined   Skilled Therapeutic Intervention  1:1 Ot eval  initiated with OT goals, purpose and role with pt and pt's daughter. Focus on self care retraining at sink level with focus on sit to stands, standing tolerance and balance, more functional use of LEs in sit to stands to decr pain in shoulders, short distance functional ambulation with RW, and bed mobility. Pt able to perform bed mobility with mod A with extra time. Pt with increased pain in bilateral shoulders limiting her attention to task with distractions and tolerance to activity. Pt able to ambulate short distance with RW but at a very slow pace and shuffling with inability to take bigger steps. Pt returned to bed and ended session 10 min early due to fatigue and requesting to rest.    OT Evaluation Precautions/Restrictions  Precautions Precautions: Fall;Cervical Required Braces or Orthoses: Cervical Brace Cervical Brace: Hard collar;At all times Restrictions Weight Bearing Restrictions: No General Chart Reviewed: Yes Family/Caregiver Present: Yes (daughter Blain Pais)   Pain Pain Assessment Pain Assessment: Faces Faces Pain Scale: Hurts whole lot Pain Descriptors / Indicators: Restless Pain Onset: On-going Pain Intervention(s): Rest;RN made aware Home Living/Prior Functioning Home Living Family/patient expects to be discharged to:: Private residence Living Arrangements: Alone Available Help at Discharge: Family, Available PRN/intermittently Type of Home: House Home Access: Stairs to enter Technical brewer of Steps: 1 1/2 at daughters Entrance Stairs-Rails: Right Home Layout:  (will d/c to one level house) Bathroom Shower/Tub: Multimedia programmer: Standard  Lives With: Alone ADL  see functional navigator Vision/Perception  Vision- History Baseline Vision/History: Wears glasses Wears Glasses: At all times Patient Visual Report: No change from baseline Vision- Assessment Vision Assessment?: No apparent visual deficits  Cognition Overall Cognitive Status:  Impaired/Different from baseline Arousal/Alertness: Awake/alert Orientation Level: Person;Place;Situation Person: Oriented Place: Oriented Situation: Oriented Year: 2017 Month: October Day of Week: Incorrect (Reported it was Monday) Memory: Impaired Memory Impairment: Decreased recall of new information Immediate Memory Recall: Sock;Blue;Bed Memory Recall: Blue (unablet to recall sock or bed even with cue) Memory Recall Blue: With Cue Attention: Focused;Sustained Focused Attention: Appears intact Sustained Attention: Impaired Sustained Attention Impairment: Functional basic;Verbal basic Awareness: Impaired Awareness Impairment: Intellectual  impairment Problem Solving: Impaired Problem Solving Impairment: Functional basic Executive Function: Reasoning;Organizing Reasoning: Impaired Reasoning Impairment: Functional basic;Functional complex Organizing: Impaired Organizing Impairment: Functional basic;Functional complex Safety/Judgment: Impaired Sensation Sensation Light Touch: Appears Intact Stereognosis: Appears Intact Hot/Cold: Appears Intact Proprioception: Appears Intact Coordination Gross Motor Movements are Fluid and Coordinated: No Fine Motor Movements are Fluid and Coordinated: Yes Coordination and Movement Description: shuffling in ambulation; incoordiation with gross motor movement Motor  Motor Motor: Abnormal postural alignment and control Mobility  Bed Mobility Bed Mobility: Supine to Sit;Sit to Supine Supine to Sit: 3: Mod assist Sit to Supine: 3: Mod assist Transfers Transfers: Sit to Stand;Stand to Sit Sit to Stand: 4: Min assist Stand to Sit: 4: Min assist  Trunk/Postural Assessment  Cervical Assessment Cervical Assessment:  (forward flexed and in c- collar - resistricted movement) Thoracic Assessment Thoracic Assessment: Exceptions to [facility] (rounded shoulders) Lumbar Assessment Lumbar Assessment:  (posterior pelvic tilt) Postural Control Postural  Control:  (min A for righting reactions)  Balance Balance Balance Assessed: Yes Dynamic Sitting Balance Dynamic Sitting - Balance Support: During functional activity Dynamic Sitting - Level of Assistance: 4: Min assist Static Standing Balance Static Standing - Balance Support: During functional activity Static Standing - Level of Assistance: 4: Min assist Dynamic Standing Balance Dynamic Standing - Balance Support: During functional activity Dynamic Standing - Level of Assistance: 4: Min assist;3: Mod assist Extremity/Trunk Assessment  right UE- WFL with maintaining cervical precautions  3/5 Left UE- WFL with maintaining cervical precautions 3/5     See Function Navigator for Current Functional Status.   Refer to Care Plan for Long Term Goals  Recommendations for other services: Neuropsych  Discharge Criteria: Patient will be discharged from OT if patient refuses treatment 3 consecutive times without medical reason, if treatment goals not met, if there is a change in medical status, if patient makes no progress towards goals or if patient is discharged from hospital.  The above assessment, treatment plan, treatment alternatives and goals were discussed and mutually agreed upon: by patient and by family  Nicoletta Ba 12/21/2015, 12:21 PM

## 2015-12-21 NOTE — Evaluation (Signed)
Physical Therapy Assessment and Plan  Patient Details  Name: [name] [name] MRN: [id number] Date of Birth: May 14, 1945  PT Diagnosis: Abnormal posture, Abnormality of gait, Cognitive deficits, Difficulty walking and Pain in neck Rehab Potential: Good ELOS: 12-14 days   Today's Date: 12/21/2015 PT Individual Time: 0900-1000 PT Individual Time Calculation (min): 60 min     Problem List:  Patient Active Problem List   Diagnosis Date Noted  . Intractable pain 12/17/2015  . Displaced fracture of fifth cervical vertebra ([place]) 12/17/2015  . Gait disturbance 12/17/2015  . Trochanteric bursitis of both hips 09/13/2015  . Chronic low back pain 06/07/2015  . Fibromyalgia syndrome 05/17/2015  . Hypothyroidism 05/17/2015  . Parkinson's disease ([place]) 05/17/2015  . Hypercoagulable state ([place]) 02/07/2012  . History of pulmonary embolism 02/07/2012  . S/P right TKA 05/22/2011    Past Medical History:  Past Medical History:  Diagnosis Date  . Arthritis   . Chronic insomnia   . Complication of anesthesia    severe itching night of surgery requiring meds- also couldnt swallow- throat "paralyzed""  . Fibromyalgia   . Glaucoma   . Hypercholesterolemia   . Hyperlipidemia   . Hypertension    PCP Dr Cari Caraway- [place]  05/15/11 with clearance and note on chart,    chest x ray, EKG 12/12 EPIC, eccho 7/11 EPIC  . Hypothyroidism    s/p graves disease  . Kidney stone   . PD (Parkinson's disease) ([place])   . Peripheral vascular disease ([place])    PULMONARY EMBOLUS x 2- 2011, 2012/ FOLLOWED BY DR ODOGWU-LOV 12/12 EPIC   PT STAES WILL STOP COUMADIN 3/12 and begin LOVONOX 05/17/11 as previously instructed  . Sinus infection    at present- dr Honor Loh PA aware- was seen there and at PCP 05/10/11  . Thyroid disease    Hypothyroidism   Past Surgical History:  Past Surgical History:  Procedure Laterality Date  . ABDOMINAL HYSTERECTOMY    . APPENDECTOMY    . CATARACT EXTRACTION Bilateral   . CHOLECYSTECTOMY     . COLONOSCOPY    . EXPLORATORY LAPAROTOMY    . JOINT REPLACEMENT     left knee  7/12  . TOTAL KNEE ARTHROPLASTY  05/22/2011   Procedure: TOTAL KNEE ARTHROPLASTY;  Surgeon: Mauri Pole, MD;  Location: [facility];  Service: Orthopedics;  Laterality: Right;    Assessment & Plan Clinical Impression: [name] [name] a 70 y.o.right handed femalewith history of fibromyalgia, hypercoagulable disorder and history of pulmonary emboli on chronic Coumadin, Parkinson's disease with gait disturbancefollowed by Dr. [name] at [facility]. Per chart review patient lives alone. Multilevel with bedroom on main floor. Patient used a rolling walker prior to admission.She has a son and daughter in the area question assist.son-in-law is a local occupational therapist. Patient with reported 7 falls in the past 2 years. Presented 12/17/2015 after a fall with significant neck pain radiating to the shoulders. Cranial CT negative for acute changes. CT cervical spine showed moderately displaced fractures involving the posterior spinous process of C5. Conservative care recommended placed in a cervical collar. Hospital course pain management.Intermittent bouts of confusion cranial CT scan 2 negative for acute changes.Chronic Coumadin ongoing for pulmonary emboli. Physical and occupational therapy evaluations completed 12/19/2015 with recommendations of physical medicine rehabilitation consult.Patient was admitted for a comprehensive rehabilitation program  Patient transferred to CIR on 12/20/2015 .   Patient currently requires min with mobility secondary to muscle weakness and neck pain, decreased cardiorespiratoy endurance, decreased initiation, decreased attention, decreased  awareness, decreased problem solving, decreased safety awareness, decreased memory and delayed processing and decreased sitting balance, decreased standing balance, decreased postural control and decreased balance strategies.  Prior to  hospitalization, patient was modified independent  with mobility and lived with Alone in a House home.  Home access is 1 1/2 at daughtersStairs to enter.  Patient will benefit from skilled PT intervention to maximize safe functional mobility, minimize fall risk and decrease caregiver burden for planned discharge home with 24 hour supervision.  Anticipate patient will benefit from follow up OP at discharge.  PT - End of Session Activity Tolerance: Tolerates 30+ min activity with multiple rests Endurance Deficit: Yes Endurance Deficit Description: requires several rest breaks after short duration mobility PT Assessment Rehab Potential (ACUTE/IP ONLY): Good Barriers to Discharge: [place] home environment PT Patient demonstrates impairments in the following area(s): Balance;Pain;Safety;Endurance;Motor PT Transfers Functional Problem(s): Bed Mobility;Bed to Chair;Car;Furniture PT Locomotion Functional Problem(s): Ambulation;Stairs PT Plan PT Intensity: Minimum of 1-2 x/day ,45 to 90 minutes PT Frequency: 5 out of 7 days PT Duration Estimated Length of Stay: 12-14 days PT Treatment/Interventions: Ambulation/gait training;Balance/vestibular training;Community reintegration;Cognitive remediation/compensation;Discharge planning;Disease management/prevention;Functional mobility training;Neuromuscular re-education;DME/adaptive equipment instruction;Patient/family education;Pain management;Stair training;Psychosocial support;Therapeutic Activities;Therapeutic Exercise;UE/LE Coordination activities;UE/LE Strength taining/ROM PT Transfers Anticipated Outcome(s): S PT Locomotion Anticipated Outcome(s): S household distances PT Recommendation Recommendations for Other Services: Speech consult Follow Up Recommendations: Outpatient PT Patient destination: Home Equipment Recommended: None recommended by PT;To be determined Equipment Details: Has RW, rollator; may need w/c for community  mobility  Skilled Therapeutic Intervention Pt received supine in bed, c/o pain as below and agreeable to treatment. PT initial evaluation performed and completed. Pt's daughter present for session. Pt performed bed mobility supine>sit with S and mod verbal cues due to posterior lean upon sitting up, able to correct with cueing. Sit <>stand with minA and RW from edge of bed with pt reporting she needs to use the restroom; significant posterior bias when standing and unable to shift weight over feet despite cueing, heavy reliance on bed with LEs for support. Unable to ambulate into bathroom as planned. Returned to sitting, and attempted again, instructed pt in use of BSC instead of walking to the bathroom, pt agreeable. Sit <>stand without AD and pt began to initiate pivotal steps to [facility]. Pt seated on BSC several minutes before stating "I thought you were going to take me to the bathroom". Reminded pt she was on [facility], and pt proceeded to urinate. Stand pivot to return to bed with pt initiating without cueing, transferring with brief around ankles and before hygiene had been performed. Pt returned to lying, stating she needed to lay down for a minute. After a short break pt returned to w/c with stand pivot, brief donned. Gait as described below; cues for increasing gait speed however poor carryover once cueing removed. Educated pt and daughter in rehab process, goals, ELOS, falls prevention safety including use of safety belt if pt left up in w/c without family present; both agreeable to all the above. Remained seated in w/c at end of session, all needs in reach.   PT Evaluation Precautions/Restrictions Precautions Precautions: Fall;Cervical Required Braces or Orthoses: Cervical Brace Cervical Brace: Hard collar;At all times Restrictions Weight Bearing Restrictions: No General Chart Reviewed: Yes Response to Previous Treatment: Not applicable Family/Caregiver Present: Yes  Pain Pain Assessment Pain  Assessment: Faces Faces Pain Scale: Hurts whole lot Pain Type: Chronic pain;Surgical pain Pain Location: Neck Pain Descriptors / Indicators: Restless Pain Onset: On-going Pain Intervention(s): Medication (See eMAR)  Home Living/Prior Functioning Home Living Available Help at Discharge: Family;Available PRN/intermittently Type of Home: House Home Access: Stairs to enter [employer] of Steps: 1 1/2 at daughters Entrance Stairs-Rails: Right Home Layout:  (will d/c to one level house) Bathroom Shower/Tub: Multimedia programmer: Standard  Lives With: Alone Vision/Perception    WFL Cognition Overall Cognitive Status: Impaired/Different from baseline Arousal/Alertness: Awake/alert Orientation Level: Oriented X4 Attention: Selective Focused Attention: Appears intact Sustained Attention: Appears intact Sustained Attention Impairment: Functional basic;Verbal basic Selective Attention: Impaired Selective Attention Impairment: Functional basic;Verbal complex (Pt can attend to conversation but does not attend to tasks) Memory: Impaired Memory Impairment: Decreased recall of new information;Storage deficit;Decreased short term memory Decreased Short Term Memory: Verbal complex;Functional complex Awareness: Appears intact Awareness Impairment: Intellectual impairment Problem Solving: Impaired Problem Solving Impairment: Verbal complex;Functional complex Executive Function: Sequencing;Organizing;Self Monitoring;Self Correcting Reasoning: Impaired Reasoning Impairment: Verbal complex;Functional complex Sequencing: Impaired Sequencing Impairment: Verbal complex;Functional basic (Verbal sequencing intact at basic level, functional basic impaired) Organizing: Impaired Organizing Impairment: Verbal complex;Functional complex Self Monitoring: Impaired Self Monitoring Impairment: Verbal complex;Functional complex Self Correcting: Impaired Self Correcting Impairment: Verbal  complex;Functional complex Behaviors: Perseveration;Poor frustration tolerance Safety/Judgment: Impaired Sensation Sensation Light Touch: Appears Intact Stereognosis: Appears Intact Hot/Cold: Appears Intact Proprioception: Appears Intact Coordination Gross Motor Movements are Fluid and Coordinated: No Fine Motor Movements are Fluid and Coordinated: Yes Coordination and Movement Description: shuffling in ambulation; incoordiation with gross motor movement Motor  Motor Motor: Abnormal postural alignment and control  Mobility Bed Mobility Bed Mobility: Supine to Sit Sit to Supine: With rail;HOB elevated;5: Supervision Sit to Supine - Details: Verbal cues for precautions/safety;Verbal cues for technique Transfers Transfers: Yes Sit to Stand: 4: Min guard;From bed;With upper extremity assist Sit to Stand Details: Verbal cues for technique;Verbal cues for sequencing;Verbal cues for precautions/safety;Tactile cues for posture;Tactile cues for weight shifting Stand to Sit: 4: Min assist;Without upper extremity assist Stand to Sit Details: uncontrolled descent to bed Stand Pivot Transfers: 4: Min assist Stand Pivot Transfer Details: Verbal cues for sequencing;Verbal cues for precautions/safety;Verbal cues for technique;Tactile cues for posture;Tactile cues for weight shifting Locomotion  Ambulation Ambulation: Yes Ambulation/Gait Assistance: 4: Min assist Ambulation Distance (Feet): 25 Feet Assistive device: Rolling walker Ambulation/Gait Assistance Details: Tactile cues for posture;Verbal cues for precautions/safety;Verbal cues for technique Gait Gait: Yes Gait Pattern: Impaired Gait Pattern: Decreased stride length;Festinating;Shuffle;Decreased trunk rotation;Narrow base of support;Poor foot clearance - left;Poor foot clearance - right Gait velocity: significantly decreased for age norms Stairs / Additional Locomotion Stairs: No Architect: No   Trunk/Postural Assessment  Cervical Assessment Cervical Assessment:  (forward flexed and in c- collar - resistricted movement) Thoracic Assessment Thoracic Assessment: Exceptions to [facility] (rounded shoulders) Lumbar Assessment Lumbar Assessment:  (posterior pelvic tilt) Postural Control Postural Control:  (min A for righting reactions)  Balance Balance Balance Assessed: Yes Dynamic Sitting Balance Dynamic Sitting - Balance Support: During functional activity Dynamic Sitting - Level of Assistance: 4: Min Insurance risk surveyor Standing - Balance Support: During functional activity Static Standing - Level of Assistance: 4: Min assist Dynamic Standing Balance Dynamic Standing - Balance Support: During functional activity Dynamic Standing - Level of Assistance: 4: Min assist;3: Mod assist Extremity Assessment  RUE Assessment RUE Assessment: Within Functional Limits LUE Assessment LUE Assessment: Within Functional Limits RLE Assessment RLE Assessment: Within Functional Limits (grossly 4+/5 throughout) LLE Assessment LLE Assessment: Within Functional Limits (grossly 4+/5 throughout)   See Function Navigator for Current Functional Status.   Refer to Care Plan  for Long Term Goals  Recommendations for other services: Neuropsych  Discharge Criteria: Patient will be discharged from PT if patient refuses treatment 3 consecutive times without medical reason, if treatment goals not met, if there is a change in medical status, if patient makes no progress towards goals or if patient is discharged from hospital.  The above assessment, treatment plan, treatment alternatives and goals were discussed and mutually agreed upon: by patient and by family  Luberta Mutter 12/21/2015, 3:51 PM

## 2015-12-21 NOTE — Evaluation (Signed)
Speech Language Pathology Assessment and Plan  Patient Details  Name: [name] [name] MRN: [id number] Date of Birth: 1945/06/06  SLP Diagnosis: Cognitive Impairments  Rehab Potential: Excellent ELOS: 01/01/16    Today's Date: 12/21/2015 SLP Individual Time: 1300-1400 SLP Individual Time Calculation (min): 60 min    Problem List:  Patient Active Problem List   Diagnosis Date Noted  . Intractable pain 12/17/2015  . Displaced fracture of fifth cervical vertebra ([place]) 12/17/2015  . Gait disturbance 12/17/2015  . Trochanteric bursitis of both hips 09/13/2015  . Chronic low back pain 06/07/2015  . Fibromyalgia syndrome 05/17/2015  . Hypothyroidism 05/17/2015  . Parkinson's disease ([place]) 05/17/2015  . Hypercoagulable state ([place]) 02/07/2012  . History of pulmonary embolism 02/07/2012  . S/P right TKA 05/22/2011   Past Medical History:  Past Medical History:  Diagnosis Date  . Arthritis   . Chronic insomnia   . Complication of anesthesia    severe itching night of surgery requiring meds- also couldnt swallow- throat "paralyzed""  . Fibromyalgia   . Glaucoma   . Hypercholesterolemia   . Hyperlipidemia   . Hypertension    PCP Dr Cari Caraway- [place]  05/15/11 with clearance and note on chart,    chest x ray, EKG 12/12 EPIC, eccho 7/11 EPIC  . Hypothyroidism    s/p graves disease  . Kidney stone   . PD (Parkinson's disease) ([place])   . Peripheral vascular disease ([place])    PULMONARY EMBOLUS x 2- 2011, 2012/ FOLLOWED BY DR ODOGWU-LOV 12/12 EPIC   PT STAES WILL STOP COUMADIN 3/12 and begin LOVONOX 05/17/11 as previously instructed  . Sinus infection    at present- dr Honor Loh PA aware- was seen there and at PCP 05/10/11  . Thyroid disease    Hypothyroidism   Past Surgical History:  Past Surgical History:  Procedure Laterality Date  . ABDOMINAL HYSTERECTOMY    . APPENDECTOMY    . CATARACT EXTRACTION Bilateral   . CHOLECYSTECTOMY    . COLONOSCOPY    . EXPLORATORY LAPAROTOMY    .  JOINT REPLACEMENT     left knee  7/12  . TOTAL KNEE ARTHROPLASTY  05/22/2011   Procedure: TOTAL KNEE ARTHROPLASTY;  Surgeon: Mauri Pole, MD;  Location: [facility];  Service: Orthopedics;  Laterality: Right;    Assessment / Plan / Recommendation Clinical Impression Patient is a 70 y.o. right handed female with history of fibromyalgia, hypercoagulable disorder and history of pulmonary emboli on chronic Coumadin, Parkinson's disease with gait disturbance followed by Dr. [name] at [facility] neurological. Per chart review patient lives alone. Multilevel with bedroom on main floor. Patient used a rolling walker prior to admission. She has a son and daughter in the area question assist. son-in-law is a local occupational therapist.  Patient with reported 7 falls in the past 2 years. Presented 12/17/2015 after a fall with significant neck pain radiating to the shoulders. Cranial CT negative for acute changes. CT cervical spine showed moderately displaced fractures involving the posterior spinous process of C5. Conservative care recommended placed in a cervical collar. Hospital course pain management.Intermittent bouts of confusion cranial CT scan 2 negative for acute changes. Chronic Coumadin ongoing for pulmonary emboli. Physical and occupational therapy evaluations completed 12/19/2015 with recommendations of physical medicine rehabilitation consult.Patient was admitted for a comprehensive rehabilitation program.   Patient administered the [facility] Cognitive Assessment 7.1 and scored 24/30 points with 26 or above considered normal. Patient demonstrated deficits in the domains of selective attention, intellectual awareness, complex problem solving,  and short term recall. Patient also demonstrated decreased frustration tolerance throughout session. Patient appropriate in social conversation but tangential when executing mildly complex functional tasks. Patient would benefit from skilled SLP intervention to maximize  her cognitive function and overall independence prior to discharge.    Skilled Therapeutic Interventions          Administered cognitive-linguistic evaluation, please see above for details. Patient and daughter educated on current cognitive status and goals of skilled SLP intervention.   SLP Assessment  Patient will need skilled [place] [facility] during CIR admission    Recommendations  Recommendations for Other Services: Neuropsych consult Patient destination: Home Follow up Recommendations: 24 hour supervision/assistance;Outpatient SLP Equipment Recommended: None recommended by SLP    SLP Frequency 3 to 5 out of 7 days   SLP Duration  SLP Intensity  SLP Treatment/Interventions 01/01/16  Minumum of 1-2 x/day, 30 to 90 minutes  Cognitive remediation/compensation;Cueing hierarchy;Functional tasks;Internal/external aids;Therapeutic Activities;Patient/family education    Pain Pain Assessment Pain Assessment: Faces Faces Pain Scale: Hurts whole lot Pain Type: Chronic pain;Surgical pain Pain Location: Neck Pain Descriptors / Indicators: Restless Pain Onset: On-going Pain Intervention(s): Medication (See eMAR)  Prior Functioning Type of Home: House  Lives With: Alone Available Help at Discharge: Family;Available PRN/intermittently  Function:  Cognition Comprehension Comprehension assist level: Understands basic 50 - 74% of the time/ requires cueing 25 - 49% of the time  Expression   Expression assist level: Expresses basic 90% of the time/requires cueing < 10% of the time.  Social Interaction Social Interaction assist level: Interacts appropriately 50 - 74% of the time - May be physically or verbally inappropriate.  Problem Solving Problem solving assist level: Solves basic 50 - 74% of the time/requires cueing 25 - 49% of the time  Memory Memory assist level: Recognizes or recalls 75 - 89% of the time/requires cueing 10 - 24% of the time   Short Term  Goals: Week 1: SLP Short Term Goal 1 (Week 1): Patient will demonstrate selective attention to functional task for 15 minutes given Min A verbal cues. SLP Short Term Goal 2 (Week 1): Patient will demonstrate mildly complex problem solving with functional tasks given Min A verbal/question cues.  SLP Short Term Goal 3 (Week 1): Patient will demonstrate intellectual awareness of 2 cognitive deficits given Min A question cues.  SLP Short Term Goal 4 (Week 1): Patient will demonstrate recall of new information given Min A verbal cues and the use external aids with 90% accuracy. SLP Short Term Goal 5 (Week 1): Patient will demonstrate emergent awareness of decreased frustration tolerance and request rest breaks when needed with Min A question and verbal cues.   Refer to Care Plan for Long Term Goals  Recommendations for other services: Neuropsych  Discharge Criteria: Patient will be discharged from SLP if patient refuses treatment 3 consecutive times without medical reason, if treatment goals not met, if there is a change in medical status, if patient makes no progress towards goals or if patient is discharged from hospital.  The above assessment, treatment plan, treatment alternatives and goals were discussed and mutually agreed upon: by patient and by family  Thornton Papas 12/22/2015, 6:59 AM

## 2015-12-22 ENCOUNTER — Inpatient Hospital Stay (HOSPITAL_COMMUNITY): Payer: Medicare Other | Admitting: Speech Pathology

## 2015-12-22 ENCOUNTER — Inpatient Hospital Stay (HOSPITAL_COMMUNITY): Payer: Medicare Other | Admitting: Occupational Therapy

## 2015-12-22 ENCOUNTER — Inpatient Hospital Stay (HOSPITAL_COMMUNITY): Payer: Medicare Other | Admitting: Physical Therapy

## 2015-12-22 DIAGNOSIS — S12491S Other nondisplaced fracture of fifth cervical vertebra, sequela: Secondary | ICD-10-CM

## 2015-12-22 LAB — PROTIME-INR
INR: 2.6
Prothrombin Time: 28.3 seconds — ABNORMAL HIGH (ref 11.4–15.2)

## 2015-12-22 MED ORDER — WARFARIN SODIUM 3 MG PO TABS
3.0000 mg | ORAL_TABLET | Freq: Once | ORAL | Status: AC
  Administered 2015-12-22: 3 mg via ORAL
  Filled 2015-12-22: qty 1

## 2015-12-22 NOTE — Progress Notes (Signed)
Physical Therapy Session Note  Patient Details  Name: [name] [name] MRN: [id number] Date of Birth: 25-Sep-1945  Today's Date: 12/22/2015 PT Individual Time: 1300-1415 PT Individual Time Calculation (min): 75 min    Short Term Goals: Week 1:  PT Short Term Goal 1 (Week 1): Pt will perform bed mobility on flat bed with S PT Short Term Goal 2 (Week 1): Pt will perform stand pivot transfers with consistent min guard PT Short Term Goal 3 (Week 1): Pt will perform gait x50' with RW and min guard PT Short Term Goal 4 (Week 1): Pt will initiate stair training  Skilled Therapeutic Interventions/Progress Updates:   Pt received supine in bed, c/o 10/10 pain in neck and agreeable to treatment. Pt's daughter reports pt recently took pain medication. Supine>sit with S and bedrails with HOB elevated. Stand pivot transfer bed>w/c with min guard. W/c propulsion x150' with BUE for strengthening and aerobic endurance. Car transfer performed stand pivot without AD and min guard. Stairs performed x12 total on 3" and 6" stairs, B handrails and min guard. Standing balance on balance wedge for plantarflexor stretch and facilitate closed chain dorsiflexors. Pt focused on pain and requires several rest breaks due to pain/fatigue, including one supine rest break on mat table with pt performing supine <>sit with S. Pt motivated to return to room, states "let's walk back to room" and ambulated 2x90' with one self-selected seated rest break in standard chair. Once in bed, returned to bed with S and participated in pet therapy while resting. Perfored x12 reps sit <>stand with exaggerated overhead arm extensions to improve power production and amplitude of movement. Required cues to reduce LE reliance on bed for support and improve anterior weight shift. Pt returned to supine with S. 1x10 SLR BLE with min cues for amplitude of movement. Remained supine in bed with alarm intact and all needs in reach.   Therapy  Documentation Precautions:  Precautions Precautions: Fall, Cervical Required Braces or Orthoses: Cervical Brace Cervical Brace: Hard collar, At all times Restrictions Weight Bearing Restrictions: No   See Function Navigator for Current Functional Status.   Therapy/Group: Individual Therapy  Luberta Mutter 12/22/2015, 2:03 PM

## 2015-12-22 NOTE — Progress Notes (Signed)
[facility] PHYSICAL MEDICINE & REHABILITATION     PROGRESS NOTE    Subjective/Complaints: Slept better. Still has cervical and occipital pain. Heat helps. Has Kpad in place. Robaxin also seems to help.   ROS: Pt denies fever, rash/itching,  blurred or double vision, nausea, vomiting, abdominal pain, diarrhea, chest pain, shortness of breath, palpitations, dysuria, dizziness, , bleeding, anxiety, or depression   Objective: Vital Signs: Blood pressure (!) 143/67, pulse 66, temperature 97.7 F (36.5 C), temperature source Oral, resp. rate 16, height 5\' 6"  (1.676 m), weight 88.1 kg (194 lb 4.8 oz), SpO2 93 %. No results found.  Recent Labs  12/21/15 0630  WBC 10.1  HGB 13.2  HCT 39.1  PLT 321    Recent Labs  12/21/15 0630  NA 138  K 3.3*  CL 100*  GLUCOSE 134*  BUN 11  CREATININE 0.62  CALCIUM 9.1   CBG (last 3)  No results for input(s): GLUCAP in the last 72 hours.  Wt Readings from Last 3 Encounters:  12/22/15 88.1 kg (194 lb 4.8 oz)  12/17/15 88.5 kg (195 lb)  11/24/15 89.4 kg (197 lb 3.2 oz)    Physical Exam:   HENT:  Head: Normocephalic.  Eyes: EOMare normal.  Cardiovascular: Normal rateand regular rhythm.  Respiratory: Effort normaland breath sounds normal. No respiratory distress.  GI: Soft. Bowel sounds are normal. She exhibits no distension.  Neurological:  Patient's eyes are open. Makes eye contact. More initiating and alert. Bradykinesias.  Moves all 4's without sign of focal weakness. Do not see focal sensory loss either.  Improved awareness today Skin: Skin is warmand dry.  Psychiatric:  Affect more dynamic   Assessment/Plan: 1. Gait and functional deficits secondary to C5 Fx (hx of parkinson's disease) which require 3+ hours per day of interdisciplinary therapy in a comprehensive inpatient rehab setting. Physiatrist is providing close team supervision and 24 hour management of active medical problems listed below. Physiatrist and rehab  team continue to assess barriers to discharge/monitor patient progress toward functional and medical goals.  Function:  Bathing Bathing position   Position: Wheelchair/chair at sink  Bathing parts Body parts bathed by patient: Right arm, Left arm, Chest, Abdomen, Front perineal area, Right upper leg, Left upper leg Body parts bathed by helper: Buttocks, Right lower leg, Left lower leg, Back  Bathing assist Assist Level: Touching or steadying assistance(Pt > 75%)      Upper Body Dressing/Undressing Upper body dressing   What is the patient wearing?: Button up shirt         Button up shirt - Perfomed by patient: Thread/unthread right sleeve, Thread/unthread left sleeve, Button/unbutton shirt Button up shirt - Perfomed by helper: Pull shirt around back    Upper body assist Assist Level: Touching or steadying assistance(Pt > 75%)      Lower Body Dressing/Undressing Lower body dressing   What is the patient wearing?: Pants, Socks, Shoes     Pants- Performed by patient: Thread/unthread right pants leg, Thread/unthread left pants leg Pants- Performed by helper: Pull pants up/down     Socks - Performed by patient:  (able to doff not donn) Socks - Performed by helper: Don/doff right sock, Don/doff left sock Shoes - Performed by patient: Don/doff right shoe, Don/doff left shoe            Lower body assist Assist for lower body dressing: Touching or steadying assistance (Pt > 75%)      Toileting Toileting     Toileting steps completed by  helper: Adjust clothing prior to toileting, Adjust clothing after toileting, Performs perineal hygiene    Toileting assist Assist level: Touching or steadying assistance (Pt.75%)   Transfers Chair/bed transfer   Chair/bed transfer method: Stand pivot Chair/bed transfer assist level: Touching or steadying assistance (Pt > 75%)       Locomotion Ambulation     Max distance: 25 Assist level: Touching or steadying assistance (Pt > 75%)    Wheelchair          Cognition Comprehension Comprehension assist level: Understands basic 50 - 74% of the time/ requires cueing 25 - 49% of the time  Expression Expression assist level: Expresses basic 90% of the time/requires cueing < 10% of the time.  Social Interaction Social Interaction assist level: Interacts appropriately 50 - 74% of the time - May be physically or verbally inappropriate.  Problem Solving Problem solving assist level: Solves basic 50 - 74% of the time/requires cueing 25 - 49% of the time  Memory Memory assist level: Recognizes or recalls 75 - 89% of the time/requires cueing 10 - 24% of the time   Medical Problem List and Plan: 1.  Debilitation secondary to C5 fracture after fall as well as history of Parkinson's disease. Cervical collar at all times             -continue CIR therapies 2.  DVT Prophylaxis/Anticoagulation: Chronic Coumadin for history of pulmonary emboli/hypercoagulable state. Monitor for any bleeding episodes 3. Pain Management: Oxycodone as needed  -robaxin PRN  -utilize ice/heat also--heat helips 4. Mood: Cymbalta 60 mg daily 5. Neuropsych: This patient is capable of making decisions on her own behalf. 6. Skin/Wound Care: Routine skin checks 7. Fluids/Electrolytes/Nutrition: I personally reviewed the patient's labs today.   -replacing potassium 8. Parkinson disease. Sinemet 25-100 3 times a day. Followed by Dr. Adline Peals neurological 9. Hypertension. HCTZ 12.5 mg daily, Diovan 160 mg daily. Monitor with increased mobility2 10. Hypothyroidism. Synthroid. TSH 0.61 11. Hyperlipidemia. Lipitor, Lovaza   LOS (Days) 2 A FACE TO FACE EVALUATION WAS PERFORMED  Anita Laguna T 12/22/2015 12:08 PM

## 2015-12-22 NOTE — Progress Notes (Signed)
Social Work Patient ID: [name] [name], female   DOB: [birth year], 70 y.o.   MRN: [id number]  Have reviewed team conference with pt, daughter and son-in-law.  They are aware team has targeted d/c date of 10/28 with supervision goals.  Pt initially reports that she plans to d/c to her own home.  Daughter quickly counters that she needs to d/c to their home in order for them to be able to provide needed supervision.  They will continue to discuss.  Tavarious Freel, LCSW

## 2015-12-22 NOTE — Progress Notes (Signed)
Occupational Therapy Session Note  Patient Details  Name: [name] [name] MRN: [id number] Date of Birth: 11-06-1945  Today's Date: 12/22/2015 OT Individual Time: 0900-1000 OT Individual Time Calculation (min): 60 min     Short Term Goals: Week 1:  OT Short Term Goal 1 (Week 1): Pt will perform toileting with mod A sit to stand OT Short Term Goal 2 (Week 1): Pt will perform bed mobility with supervision without bed rails OT Short Term Goal 3 (Week 1): Pt will demonstrate intellecual awareness with min questioning cuing  OT Short Term Goal 4 (Week 1): Pt will demonstrate selective attention during functional tasks  Skilled Therapeutic Interventions/Progress Updates:   1:1 self care retraining at shower level with focus on functional ambulation with RW in and out of bathroom with min A at a slow pace and min A for steering RW and for safety. Pt able to perform sit to stands with steadying A and focused on perform sit to stands without so much heavy reliance on UEs to decr pain and increased dynamic standign balance. Pt perform bathing parts with more thoroughness in shower today compared to at shower. Returned to bed supine to apply new pads to c- collar. Pt with decr tolerate to laying flat without UEs up above her head. PT continues to require min to mod cuing for functional problem solving and sequencing events. Pt performed grooming at sink. Applied kinesotape to bilateral Rhomboid areas to assist with pain management in shoulders- difficulty to fully tape upper neck due to brace. Pt able to tolerate sitting in the w/c in between therapies today- an improvement from yesterday .   Therapy Documentation Precautions:  Precautions Precautions: Fall, Cervical Required Braces or Orthoses: Cervical Brace Cervical Brace: Hard collar, At all times Restrictions Weight Bearing Restrictions: No Pain: Ongoing pain in bilateral shoulders- allowed for rest breaks as needed, shower and applied kinesotape  and applied heating pad to shoulders in w/c at the end of session See Function Navigator for Current Functional Status.   Therapy/Group: Individual Therapy  Willeen Cass [facility] 12/22/2015, 3:29 PM

## 2015-12-22 NOTE — Progress Notes (Signed)
Speech Language Pathology Daily Session Note  Patient Details  Name: [name] [name] MRN: [id number] Date of Birth: 1945/06/12  Today's Date: 12/22/2015 SLP Individual Time: 1100-1200 SLP Individual Time Calculation (min): 60 min   Short Term Goals: Week 1: SLP Short Term Goal 1 (Week 1): Patient will demonstrate selective attention to functional task for 15 minutes given Min A verbal cues. SLP Short Term Goal 2 (Week 1): Patient will demonstrate mildly complex problem solving with functional tasks given Min A verbal/question cues.  SLP Short Term Goal 3 (Week 1): Patient will demonstrate intellectual awareness of 2 cognitive deficits given Min A question cues.  SLP Short Term Goal 4 (Week 1): Patient will demonstrate recall of new information given Min A verbal cues and the use external aids with 90% accuracy. SLP Short Term Goal 5 (Week 1): Patient will demonstrate emergent awareness of decreased frustration tolerance and request rest breaks when needed with Min A question and verbal cues.   Skilled Therapeutic Interventions:Pt seen for skilled SLP treatment with focus on cognitive goals. Pt required intermittent verbal redirection to attend to task. Mod A for functional word problems secondary to difficulty with reasoning to choose math function required to solve the problem.   Function:  Eating Eating                 Cognition Comprehension Comprehension assist level: Understands basic 75 - 89% of the time/ requires cueing 10 - 24% of the time  Expression   Expression assist level: Expresses basic 90% of the time/requires cueing < 10% of the time.  Social Interaction Social Interaction assist level: Interacts appropriately 75 - 89% of the time - Needs redirection for appropriate language or to initiate interaction.  Problem Solving Problem solving assist level: Solves basic 75 - 89% of the time/requires cueing 10 - 24% of the time  Memory Memory assist level: Recognizes or  recalls 50 - 74% of the time/requires cueing 25 - 49% of the time    Pain Pain Assessment Pain Assessment: 0-10 Pain Score: 10-Worst pain ever Pain Type: Acute pain Pain Location: Neck Pain Orientation: Right;Left Pain Descriptors / Indicators: Aching Pain Frequency: Intermittent Pain Onset: On-going Patients Stated Pain Goal: 3 Pain Intervention(s): RN made aware;Repositioned Multiple Pain Sites: No  Therapy/Group: Individual Therapy  Vinetta Bergamo MA, CCC-SLP 12/22/2015, 4:11 PM

## 2015-12-22 NOTE — Progress Notes (Signed)
Social Work Patient ID: [name] [name], female   DOB: [birth year], 70 y.o.   MRN: [id number]  [name] Curb, LCSW Social Worker Signed   Patient Care Conference Date of Service: 12/21/2015  4:13 PM      Hide copied text Hover for attribution information Inpatient RehabilitationTeam Conference and Plan of Care Update Date: 12/21/2015   Time: 2:20 PM      Patient Name: [name] [name]      Medical Record Number: [id number]  Date of Birth: 1945/10/01 Sex: Female         Room/Bed: 4W20C/4W20C-01 Payor Info: Payor: MEDICARE / Plan: MEDICARE PART A AND B / Product Type: *No Product type* /     Admitting Diagnosis: C5 Fx  Admit Date/Time:  12/20/2015  6:22 PM Admission Comments: No comment available    Primary Diagnosis:  <principal problem not specified> Principal Problem: <principal problem not specified>       Patient Active Problem List    Diagnosis Date Noted  . Intractable pain 12/17/2015  . Displaced fracture of fifth cervical vertebra ([place]) 12/17/2015  . Gait disturbance 12/17/2015  . Trochanteric bursitis of both hips 09/13/2015  . Chronic low back pain 06/07/2015  . Fibromyalgia syndrome 05/17/2015  . Hypothyroidism 05/17/2015  . Parkinson's disease ([place]) 05/17/2015  . Hypercoagulable state ([place]) 02/07/2012  . History of pulmonary embolism 02/07/2012  . S/P right TKA 05/22/2011      Expected Discharge Date: Expected Discharge Date: 01/01/16   Team Members Present: Physician leading conference: Dr. Alger Simons Social Worker Present: Lennart Pall, LCSW Nurse Present: Heather Roberts, RN PT Present: Kem Parkinson, PT OT Present: Willeen Cass, OT SLP Present: Weston Anna, SLP PPS Coordinator present : Daiva Nakayama, RN, CRRN       Current Status/Progress Goal Weekly Team Focus  Medical   C5 fx after fall. hx of PD. baseline balance issue  improve pain, avoid cognitive setbacks due to analgesia  pain control, nutrition   Bowel/Bladder   Occasional incontinence urine  due to urgency  Continent, no retention, no S/S UTI  Timed toileting    Swallow/Nutrition/ Hydration             ADL's   Mod A for bed mobility; min to mod A for ADL tasks, decr standing balance, incr pain and decr tolerance to actiivty, decr recall and memory  supervision   cognitive remediation, actiivty tolerance, pain management, standing balance,   Mobility   modA bed mobility, minA sit <>stand, gait x25' with RW, decr recall and problem solving, incr pain  S overall, minA stairs  activity tolerance, pain management, gait training, transfers   Communication             Safety/Cognition/ Behavioral Observations Cognitive Eval today, Overall Min-Mod A  Supervision  complex problem solving, recall and attention    Pain   Neck, shoulders.  Taking Oxy IR Q 4-6 hrs.  Managed at goal 3/10.  Utilize alternative methods of pain control; K-pad, Robaxin ordered PRN, repositioning; monitor effectiveness.   Skin   Scattered bruising, mild MASD peri-area.  No s/s infection, ecchymosis resolving at D/C.  Monitor, timed toileting to reduce incontinence, MASD.     Rehab Goals Patient on target to meet rehab goals: Yes *See Care Plan and progress notes for long and short-term goals.   Barriers to Discharge: premorbid balance issues related to PD   Possible Resolutions to Barriers:  continued NMR,equipment training, caregiver ed   Discharge Planning/Teaching Needs:  Home with intermittent  assistance from daughter and son-in-law and son.  Teaching is ongoing.   Team Discussion:  New eval.  Must be caregul with pain meds as they will easily affect her cognition.  Goals set for overall supervision.  SW questioned if family will be able to provide this.  ST addressing higher level cognition.    Revisions to Treatment Plan:  None    Continued Need for Acute Rehabilitation Level of Care: The patient requires daily medical management by a physician with specialized training in physical medicine and  rehabilitation for the following conditions: Daily direction of a multidisciplinary physical rehabilitation program to ensure safe treatment while eliciting the highest outcome that is of practical value to the patient.: Yes Daily medical management of patient stability for increased activity during participation in an intensive rehabilitation regime.: Yes Daily analysis of laboratory values and/or radiology reports with any subsequent need for medication adjustment of medical intervention for : Neurological problems;Post surgical problems   [name] 12/22/2015, 10:27 AM

## 2015-12-22 NOTE — Progress Notes (Signed)
ANTICOAGULATION CONSULT NOTE - Initial Consult  Pharmacy Consult for Coumadin Indication: history of PE  Allergies  Allergen Reactions  . Crestor [Rosuvastatin Calcium] Other (See Comments)    Feeling very bad, aching all over.  . Erythromycin Hives  . Gabapentin Other (See Comments)    Blurred vision  . Pregabalin Other (See Comments)    Crossed vision.  . Carbidopa Other (See Comments)    Headaches, nausea, dizziness  . Macrobid [Nitrofurantoin] Hives  . Losartan Other (See Comments)    Patient Measurements: Height: 5\' 6"  (167.6 cm) Weight: 194 lb 4.8 oz (88.1 kg) IBW/kg (Calculated) : 59.3  Vital Signs: Temp: 97.7 F (36.5 C) (10/18 0500) Temp Source: Oral (10/18 0500) BP: 143/67 (10/18 0500) Pulse Rate: 66 (10/18 0500)  Labs:  Recent Labs  12/20/15 0737 12/21/15 0630 12/22/15 0949  HGB  --  13.2  --   HCT  --  39.1  --   PLT  --  321  --   LABPROT 30.2* 29.9* 28.3*  INR 2.81 2.77 2.60  CREATININE  --  0.62  --     Estimated Creatinine Clearance: 71 mL/min (by C-G formula based on SCr of 0.62 mg/dL).   Assessment: 63 YOF on  chronic warfarin for hypercoaguable state.  INR down this AM to 2.6, therapeutic, hgb 13.2, pltc 321K. Stable.   PTA dose is 4mg  daily. INR 10/11 in clinic was 3.0.   Goal of Therapy:  INR 2-3 Monitor platelets by anticoagulation protocol: Yes   Plan:  Warfarin 3 mg PO x1 Daily INR Monitor CBC, s/sx bleeding, PO intake  Maryanna Shape, PharmD, BCPS  Clinical Pharmacist  Pager: [phone]

## 2015-12-22 NOTE — Care Management Note (Signed)
[facility] Individual Statement of Services  Patient Name:  [name] [name]  Date:  12/22/2015  Welcome to the [facility].  Our goal is to provide you with an individualized program based on your diagnosis and situation, designed to meet your specific needs.  With this comprehensive rehabilitation program, you will be expected to participate in at least 3 hours of rehabilitation therapies Monday-Friday, with modified therapy programming on the weekends.  Your rehabilitation program will include the following services:  Physical Therapy (PT), Occupational Therapy (OT), Speech Therapy (ST), 24 hour per day rehabilitation nursing, Therapeutic Recreaction (TR), Neuropsychology, Case Management (Social Worker), Rehabilitation Medicine, Nutrition Services and Pharmacy Services  Weekly team conferences will be held on Tuesdays to discuss your progress.  Your Social Worker will talk with you frequently to get your input and to update you on team discussions.  Team conferences with you and your family in attendance may also be held.  Expected length of stay: 2 weeks  Overall anticipated outcome: supervision  Depending on your progress and recovery, your program may change. Your Social Worker will coordinate services and will keep you informed of any changes. Your Social Worker's name and contact numbers are listed  below.  The following services may also be recommended but are not provided by the [facility] will be made to provide these services after discharge if needed.  Arrangements include referral to agencies that provide these services.  Your insurance has been verified to be:  Medicare and [place] Your primary doctor is:  Dr. Lorelei Pont  Pertinent information will be shared with your doctor and  your insurance company.  Social Worker:  [place], [place] or ([phone]   Information discussed with and copy given to patient by: Lennart Pall, 12/22/2015, 1:16 PM

## 2015-12-23 ENCOUNTER — Inpatient Hospital Stay (HOSPITAL_COMMUNITY): Payer: Medicare Other | Admitting: Occupational Therapy

## 2015-12-23 ENCOUNTER — Inpatient Hospital Stay (HOSPITAL_COMMUNITY): Payer: Medicare Other | Admitting: Physical Therapy

## 2015-12-23 ENCOUNTER — Encounter (HOSPITAL_COMMUNITY): Payer: Medicare Other

## 2015-12-23 ENCOUNTER — Inpatient Hospital Stay (HOSPITAL_COMMUNITY): Payer: Medicare Other | Admitting: Speech Pathology

## 2015-12-23 LAB — PROTIME-INR
INR: 2.85
Prothrombin Time: 30.5 seconds — ABNORMAL HIGH (ref 11.4–15.2)

## 2015-12-23 MED ORDER — FENTANYL 12 MCG/HR TD PT72
12.5000 ug | MEDICATED_PATCH | TRANSDERMAL | Status: DC
  Administered 2015-12-23 – 2015-12-26 (×2): 12.5 ug via TRANSDERMAL
  Filled 2015-12-23 (×3): qty 1

## 2015-12-23 MED ORDER — WARFARIN SODIUM 3 MG PO TABS
3.0000 mg | ORAL_TABLET | Freq: Once | ORAL | Status: AC
  Administered 2015-12-23: 3 mg via ORAL
  Filled 2015-12-23: qty 1

## 2015-12-23 NOTE — IPOC Note (Addendum)
Overall Plan of Care [facility]) Patient Details Name: [name] [name] MRN: [id number] DOB: 31-Oct-1945  Admitting Diagnosis: C5 Fx  Hospital Problems: Active Problems:   Fibromyalgia syndrome   Parkinson's disease ([place])   Chronic low back pain   Displaced fracture of fifth cervical vertebra [facility])     Functional Problem List: Nursing Bladder, Bowel, Endurance, Motor, Nutrition, Pain, Safety, Skin Integrity, Medication Management  PT Balance, Pain, Safety, Endurance, Motor  OT Balance, Behavior, Safety, Sensory, Skin Integrity, Cognition, Edema, Endurance, Motor, Nutrition, Pain, Perception  SLP Cognition  TR         Basic ADL's: OT Eating, Grooming, Bathing, Dressing, Toileting     Advanced  ADL's: OT Simple Meal Preparation     Transfers: PT Bed Mobility, Bed to Chair, Car, Manufacturing systems engineer, Metallurgist: PT Ambulation, Stairs     Additional Impairments: OT Fuctional Use of Upper Extremity  SLP Social Cognition   Attention, Memory, Problem Solving, Awareness  TR      Anticipated Outcomes Item Anticipated Outcome  Self Feeding supervision  Swallowing      Basic self-care  supervision  Toileting  supervision   Bathroom Transfers supervision  Bowel/Bladder  Min assist  Transfers  S  Locomotion  S household distances  Librarian, academic  Supervision  (Simultaneous filing. User may not have seen previous data.)  Pain  <3  Safety/Judgment  Mod 1   Therapy Plan: PT Intensity: Minimum of 1-2 x/day ,45 to 90 minutes PT Frequency: 5 out of 7 days PT Duration Estimated Length of Stay: 12-14 days OT Intensity: Minimum of 1-2 x/day, 45 to 90 minutes OT Frequency: 5 out of 7 days OT Duration/Estimated Length of Stay: ~2 weeks SLP Intensity: Minumum of 1-2 x/day, 30 to 90 minutes SLP Frequency: 3 to 5 out of 7 days SLP Duration/Estimated Length of Stay: 01/01/16       Team Interventions: Nursing Interventions Pain Management  PT  interventions Ambulation/gait training, Training and development officer, Community reintegration, Cognitive remediation/compensation, Discharge planning, Disease management/prevention, Functional mobility training, Neuromuscular re-education, DME/adaptive equipment instruction, Patient/family education, Pain management, Stair training, Psychosocial support, Therapeutic Activities, Therapeutic Exercise, UE/LE Coordination activities, UE/LE Strength taining/ROM  OT Interventions Balance/vestibular training, Discharge planning, Functional electrical stimulation, Pain management, Self Care/advanced ADL retraining, Therapeutic Activities, UE/LE Coordination activities, Visual/perceptual remediation/compensation, Therapeutic Exercise, Skin care/wound managment, Patient/family education, Functional mobility training, Disease mangement/prevention, Cognitive remediation/compensation, Academic librarian, Engineer, drilling, Psychosocial support, Neuromuscular re-education, Splinting/orthotics, UE/LE Strength taining/ROM, Wheelchair propulsion/positioning  SLP Interventions Cognitive remediation/compensation, English as a second language teacher, Functional tasks, Internal/external aids, Therapeutic Activities, Patient/family education  TR Interventions    SW/CM Interventions Discharge Planning, Psychosocial Support, Patient/Family Education    Team Discharge Planning: Destination: PT-Home ,OT- Home , SLP-Home Projected Follow-up: PT-Outpatient PT, OT-HH   , SLP-24 hour supervision/assistance, Outpatient SLP Projected Equipment Needs: PT-None recommended by PT, To be determined, OT- To be determined, SLP-None recommended by SLP Equipment Details: PT-Has RW, rollator; may need w/c for community mobility, OT-  Patient/family involved in discharge planning: PT- Patient, Family member/caregiver,  OT-Patient, SLP-Patient, Family member/caregiver  MD ELOS: 12-18d Medical Rehab Prognosis:  Good Assessment: 70 y.o.right  handed femalewith history of fibromyalgia, hypercoagulable disorder and history of pulmonary emboli on chronic Coumadin, Parkinson's disease with gait disturbancefollow[facility] by Dr[name]. Per chart review patient lives alone. Multilevel with bedroom on main floor. Patient used a rolling walker prior to admission.She has a son and daughter in the area question assist.  son-in-law is a local occupational therapist. Patient with reported 7 falls in the past 2 years. Presented 12/17/2015 after a fall with significant neck pain radiating to the shoulders. Cranial CT negative for acute changes. CT cervical spine showed moderately displaced fractures involving the posterior spinous process of C5. Conservative care recommended placed in a cervical collar. Hospital course pain management.Intermittent bouts of confusion cranial CT scan 2 negative for acute changes.     See Team Conference Notes for weekly updates to the plan of care  Now requiring 24/7 Rehab RN,MD, as well as CIR level PT, OT and SLP.  Treatment team will focus on ADLs and mobility with goals set at sup

## 2015-12-23 NOTE — Consult Note (Signed)
NEUROCOGNITIVE [place]   [name] [name] is a 70 year old, right-handed, widowed, Caucasian woman, with a diagnosis of Parkinson's disease, who was seen for brief neuropsychological assessment to assess cognitive and emotional functioning.     PROCEDURES: [2 units of 60454 on 12/23/2015]  The following tests were performed during today's visit: Brief Clinical Interview, Repeatable Battery for the Assessment of Neuropsychological Status (RBANS, form B). Test results are as follows:   RBANS Indices Scaled Score Percentile Description  Immediate Memory  85 16 Below Average  Visuospatial/Constructional 60 < 1 Profoundly Impaired  Language 82 12 Below Average  Attention 49 < 1 Profoundly Impaired  Delayed Memory 40 < 1 Profoundly Impaired  Total Score 54 < 1 Profoundly Impaired   RBANS Subtests Raw Score Percentile Description  List Learning 19 6 Impaired  Story Memory 15 25 Average  Figure Copy 14 2 Profoundly Impaired  Line Orientation 6 < 1 Profoundly Impaired  Picture Naming 9 19 Below Average  Semantic Fluency 8 1 Profoundly Impaired  Digit Span 6 4 Impaired  Coding 0 < 1 Profoundly Impaired  List Recall 0 < 1 Profoundly Impaired  List Recognition 10 < 1 Profoundly Impaired  Story Recall 2 < 1 Profoundly Impaired  Figure recall 0 < 1 Profoundly Impaired   An embedded measure of performance validity was suggestive of poor task engagement.  Moreover, she reported her pain level to be 9 out of 10 at the time of the evaluation and seemed highly distressed by her pain to the extent that it was likely impacting her ability to fully participate in the evaluation.  Therefore, the following test results should be interpreted with extreme caution, as they may represent an underestimation of her actual cognitive potential at this time.    Test results revealed marked impairment in most aspects of cognitive functioning, but with relatively  preserved confrontation naming and initial learning when information is presented within a context.  Of note, she required repeated reminders and clarifications of more complex test instructions mid-task; one measure of divided attention and psychomotor speed had to be discontinued owing to significant confusion despite clarification from the examiner.  In addition, on a subtest requiring her to make fine visual distinctions, her responses reflected significant distortion in her perception of line orientations and angles; on one occasion she provided an answer that was not an option.  Her poor performance on this measure seemed to be significantly related to confusion, despite assistance from the examiner.  However, her visual attention was also poor on a subtest of complex visual organization, as she failed to include one detail and several others were distorted.  Her verbal fluency was poor and with several repetitions.  Her delayed memory was limited; she recalled a couple of details from a previously studied story, though other details that she provided were distorted (e.g. stating that the story took place in [place] instead of [place]). She was unable to freely recall any previously studied details on additional subtests of delayed memory.  Her recognition memory was particularly impaired; she correctly identified 8 target words, but also made 8 false positive errors when identifying words from a previously studied list.    Although formal measures of depression were not administered, [name] was provided an opportunity to discuss her mood reactions during the brief clinical interview.  She acknowledged symptoms of depression since being in the hospital, but was unwilling to expand today about her depressed mood.  She denied suicidal  ideation.  She was tearful secondary to pain after testing concluded and acknowledged feeling overwhelmed.  Her feelings were validated and some time was spent practicing deep  breathing techniques.    Overall, [name] test results reflected marked cognitive disruption across domains, at the level of a Major Neurocognitive Disorder.  However, given evidence of possible poor task engagement, it could be that this is an underestimation of her true cognitive potential.  It is unclear at this time what portion of her difficulties displayed are secondary to Parkinson's disease, which would be expected to cause some slowed processing speed and mild executive dysfunction.  In addition, a significant portion of her low test scores could potentially be accounted for by pain and fatigue (due to pain medication).  Finally, she endorsed feeling depressed, which could exacerbate cognitive difficulties.  However, there could also be a deeper neurocognitive issue that cannot be ruled out based on the current evaluation alone.  She will likely require either a repeat neuropsychological screen prior to discharge or a more comprehensive evaluation in the future as an outpatient, or possibly both.    In light of these findings, the following recommendations are provided.    RECOMMENDATIONS:  Recommendations for treatment team:   . If improvement in tolerance for testing seems to be observed, a follow-up neuropsychological screening prior to discharge may be scheduled.   . When interacting with [name], directions and information should be provided in a simple, straight forward manner, and the treatment team should avoid giving multiple instructions simultaneously.  . Given improved learning when information was presented within a context, those interacting with her should try to make up a story to surround important details in order to improve her ability to recall them.   . [name] memory will likely be better when she is actively engaged in trying to remember information.  To aid in this process, when telling her important details, staff should inform her that they would like  her to try and remember what they are about to say, rather than telling her and expecting her to passively recall the information.   . [name] may also benefit from being provided with multiple trials to learn new skills given the noted memory inefficiencies.  . To the extent possible, multitasking should be avoided. . Ms. Spickerman requires more time than typical to process information. The treatment team may benefit from waiting for a verbal response to information before presenting additional information.  . Performance will generally be best in a structured, routine, and familiar environment, as opposed to situations involving complex problems.   Recommendations for discharge planning:  . Complete a comprehensive neuropsychological evaluation as an outpatient in 3-6 months to further discern the nature and extent of cognitive difficulties. . Maintain engagement in mentally, physically and cognitively stimulating activities.  . Strive to maintain a healthy lifestyle (e.g., proper diet and exercise) in order to promote physical, cognitive and emotional health.  . Establishing a power of attorney is warranted.  . Given stark visuospatial disruption documented today, Ms. Telep should refrain from driving at this time.     Marlane Hatcher, Psy.D.  Clinical Neuropsychologist

## 2015-12-23 NOTE — Progress Notes (Signed)
Writer discussed the use of aromatherapy to reduce anxiety with this patient.Patient agreed to try this approach. Two gtts of Peace placed on a cotton ball pinned to the patient's blouse

## 2015-12-23 NOTE — Progress Notes (Signed)
Occupational Therapy Session Note  Patient Details  Name: [name] [name] MRN: [id number] Date of Birth: 1945/08/14  Today's Date: 12/23/2015 OT Individual Time: 0800-0900 OT Individual Time Calculation (min): 60 min     Short Term Goals: Week 1:  OT Short Term Goal 1 (Week 1): Pt will perform toileting with mod A sit to stand OT Short Term Goal 2 (Week 1): Pt will perform bed mobility with supervision without bed rails OT Short Term Goal 3 (Week 1): Pt will demonstrate intellecual awareness with min questioning cuing  OT Short Term Goal 4 (Week 1): Pt will demonstrate selective attention during functional tasks  Skilled Therapeutic Interventions/Progress Updates:   1:1 SElf care retraining at sink level (pt's choice). When MD came into visit pt demonstrated impulsive mobility and decr safety (falling back into bed with demonstrating decr functional problem solving and awareness. PT assisted with picking out clothing for the day with mod questioning cuing for problem solving. Pt also required min to mod A for working memory; difficulty with carrying out planned task. Pt with strong posterior lean with sit to stands requiring A to achieve balance at midline. Pt perform functional ambulation around room with RW with min A for balance at a slow pace (fisterating gait). Pt also begin using soap as toothpaste this morning and required VC to correct self. Transition to gym to continue to work on dynamic standing balance using the Balance Master to address forward weight shifts and balance at midline.   Therapy Documentation Precautions:  Precautions Precautions: Fall, Cervical Required Braces or Orthoses: Cervical Brace Cervical Brace: Hard collar, At all times Restrictions Weight Bearing Restrictions: No Pain:  ongoing neck and shoulder pain but able to continue  See Function Navigator for Current Functional Status.   Therapy/Group: Individual Therapy  Willeen Cass [facility] 12/23/2015,  8:23 AM

## 2015-12-23 NOTE — Progress Notes (Signed)
ANTICOAGULATION CONSULT NOTE - Follow Up Consult  Pharmacy Consult for coumadin Indication: hypercoaguable state  Allergies  Allergen Reactions  . Crestor [Rosuvastatin Calcium] Other (See Comments)    Feeling very bad, aching all over.  . Erythromycin Hives  . Gabapentin Other (See Comments)    Blurred vision  . Pregabalin Other (See Comments)    Crossed vision.  . Carbidopa Other (See Comments)    Headaches, nausea, dizziness  . Macrobid [Nitrofurantoin] Hives  . Losartan Other (See Comments)    Patient Measurements: Height: 5\' 6"  (167.6 cm) Weight: 194 lb 4.8 oz (88.1 kg) IBW/kg (Calculated) : 59.3 Heparin Dosing Weight:   Vital Signs: Temp: 97.8 F (36.6 C) (10/19 0500) Temp Source: Oral (10/19 0500) BP: 142/55 (10/19 0500) Pulse Rate: 64 (10/19 0500)  Labs:  Recent Labs  12/21/15 0630 12/22/15 0949 12/23/15 0618  HGB 13.2  --   --   HCT 39.1  --   --   PLT 321  --   --   LABPROT 29.9* 28.3* 30.5*  INR 2.77 2.60 2.85  CREATININE 0.62  --   --     Estimated Creatinine Clearance: 71 mL/min (by C-G formula based on SCr of 0.62 mg/dL).   Medications:  Scheduled:  . aspirin EC  81 mg Oral QPC breakfast  . atorvastatin  10 mg Oral Daily  . azelastine  1 spray Each Nare BID  . Carbidopa-Levodopa ER  1 tablet Oral TID  . celecoxib  100 mg Oral BID  . cycloSPORINE  1 drop Both Eyes BID  . DULoxetine  60 mg Oral Daily  . valsartan  160 mg Oral Daily   And  . hydrochlorothiazide  12.5 mg Oral Daily  . levothyroxine  150 mcg Oral QAC breakfast  . magnesium oxide  200 mg Oral Daily  . multivitamin with minerals  1 tablet Oral Daily  . omega-3 acid ethyl esters  1 g Oral Daily  . potassium chloride  20 mEq Oral Daily  . senna  1 tablet Oral Daily  . Warfarin - Pharmacist Dosing Inpatient   Does not apply q1800  . zolpidem  5 mg Oral QHS   Infusions:    Assessment: 70 yo female with hypercoagulable state is currently on therapeutic coumadin.  INR today  is 2.85.  Home dose was 4 mg po daily of coumadin.   Goal of Therapy:  INR 2-3 Monitor platelets by anticoagulation protocol: Yes   Plan:  - coumadin 3 mg po x1 - INR in am  Kacey Vicuna, Tsz-Yin 12/23/2015,8:51 AM

## 2015-12-23 NOTE — Progress Notes (Signed)
Physical Therapy Session Note  Patient Details  Name: [name] [name] MRN: [id number] Date of Birth: 1945/11/10  Today's Date: 12/23/2015 PT Individual Time: 1030-1130 PT Individual Time Calculation (min): 60 min    Short Term Goals: Week 1:  PT Short Term Goal 1 (Week 1): Pt will perform bed mobility on flat bed with S PT Short Term Goal 2 (Week 1): Pt will perform stand pivot transfers with consistent min guard PT Short Term Goal 3 (Week 1): Pt will perform gait x50' with RW and min guard PT Short Term Goal 4 (Week 1): Pt will initiate stair training  Skilled Therapeutic Interventions/Progress Updates:   Pt received supine in bed, c/o 6/10 pain in neck and headache, as well as being "exhausted" from earlier OT session. Pt initially declining participation however agreeable to begin with bed level activities. Pt engaged in [employer] for attention, problem solving, memory and recall; min cues requires for game play instructions during game. Initially pt sitting on EOB to play, however self-selected returning to supine position due to pain after approximately 5 min. Completed one round of game while pt in bed. Supine>sit with S and increased time using bedrails. Gait x30' with RW and close S, mod cues for step length and posture. Sit <>stand x3 trials with small red wedge under B feet for gastroc stretch and anterior weight shift with closed chain dorsiflexor activation; pt with difficulty following commands for foot placement on last trial despite correctly performing on two previous trials. Supine rest break before performing another gait trial x60'. Pt fatigued and irritable throughout, telling therapist "You're about to drive me crazy" and attempted to educate pt regarding role of participation in therapy to improve strength, balance, mobility and safety. Side stepping R/L with B HHA and pt relying heavily on therapists hands to prevent posterior LOB. Standing with therapist pushing  posteriorly into pt's B hips to facilitate anterior weight shift and ankle strategy.  Pt remained supine in bed with family present, alarm intact and all needs in reach at end of session.   Therapy Documentation Precautions:  Precautions Precautions: Fall, Cervical Required Braces or Orthoses: Cervical Brace Cervical Brace: Hard collar, At all times Restrictions Weight Bearing Restrictions: No   See Function Navigator for Current Functional Status.   Therapy/Group: Individual Therapy  Luberta Mutter 12/23/2015, 2:00 PM

## 2015-12-23 NOTE — Progress Notes (Signed)
Social Work  Social Work Assessment and Plan  Patient Details  Name: [name] [name] MRN: [id number] Date of Birth: 29-Mar-1945  Today's Date: 12/23/2015  Problem List:  Patient Active Problem List   Diagnosis Date Noted  . Intractable pain 12/17/2015  . Displaced fracture of fifth cervical vertebra ([place]) 12/17/2015  . Gait disturbance 12/17/2015  . Trochanteric bursitis of both hips 09/13/2015  . Chronic low back pain 06/07/2015  . Fibromyalgia syndrome 05/17/2015  . Hypothyroidism 05/17/2015  . Parkinson's disease ([place]) 05/17/2015  . Hypercoagulable state ([place]) 02/07/2012  . History of pulmonary embolism 02/07/2012  . S/P right TKA 05/22/2011   Past Medical History:  Past Medical History:  Diagnosis Date  . Arthritis   . Chronic insomnia   . Complication of anesthesia    severe itching night of surgery requiring meds- also couldnt swallow- throat "paralyzed""  . Fibromyalgia   . Glaucoma   . Hypercholesterolemia   . Hyperlipidemia   . Hypertension    PCP Dr Cari Caraway- [facility]  05/15/11 with clearance and note on chart,    chest x ray, EKG 12/12 EPIC, eccho 7/11 EPIC  . Hypothyroidism    s/p graves disease  . Kidney stone   . PD (Parkinson's disease) ([place])   . Peripheral vascular disease ([place])    PULMONARY EMBOLUS x 2- 2011, 2012/ FOLLOWED BY DR [id number]-LOV 12/12 EPIC   PT STAES WILL STOP COUMADIN 3/12 and begin LOVONOX 05/17/11 as previously instructed  . Sinus infection    at present- dr Honor Loh PA aware- was seen there and at PCP 05/10/11  . Thyroid disease    Hypothyroidism   Past Surgical History:  Past Surgical History:  Procedure Laterality Date  . ABDOMINAL HYSTERECTOMY    . APPENDECTOMY    . CATARACT EXTRACTION Bilateral   . CHOLECYSTECTOMY    . COLONOSCOPY    . EXPLORATORY LAPAROTOMY    . JOINT REPLACEMENT     left knee  7/12  . TOTAL KNEE ARTHROPLASTY  05/22/2011   Procedure: TOTAL KNEE ARTHROPLASTY;  Surgeon: Mauri Pole, MD;  Location: [facility];  Service:  Orthopedics;  Laterality: Right;   Social History:  reports that she quit smoking about 26 years ago. She smoked 1.00 pack per day. She has never used smokeless tobacco. She reports that she drinks alcohol. She reports that she does not use drugs.  Family / Support Systems Marital Status: Widow/Widower How Long?: 16 yrs Patient Roles: Parent, Other (Comment) (grandparent) Children: daughter, Beacher May @ [phone];  son, Mikhala Manry @ [phone] Other Supports: son-in-law, Clyda Greener Anticipated Caregiver: Pt's daughter, Beacher May is primary caregiver; pt's son in law is an OT; pt's son travels and will be available on a more limited basis Ability/Limitations of Caregiver: [name] works part time from home as an RNCM.  Monica and husband have 3 children, one of whom has special needs Caregiver Availability: 24/7 Family Dynamics: Pt's family is very supportive with daughter here daily (off work this week).  Daughter and son-in-law insistent that they will provide any needed assistance.  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  Minimizes any job demands and notes she works from home. Legal Hisotry/Current Legal Issues: None Guardian/Conservator: None - per MD, pt capable of making decisions  on her own behalf.   Abuse/Neglect Physical Abuse: Denies Verbal Abuse: Denies Sexual Abuse: Denies Exploitation of patient/patient's resources: Denies Self-Neglect: Denies  Emotional Status Pt's affect, behavior adn adjustment status: Pt lying in bed but frequently sitting up and complaining of neck pain.  She completes assessmnet interview without much difficulty.  Daughter with minor corrections to some answers.  Pt  denies any current, significant emotional distress.  Per team report, some concern with higher level cognitive difficulty and have referred for neuropsychology consult to screen further. Recent Psychosocial Issues: Pt reports she was diagnosed with Parkinson's dz ~ 1 yr ago.  Also reports ~ 7 falls since, however, daughter believes this to be more. Pyschiatric History: Pt reports "depression" following husband's death and received grief counseling.  None currently. Substance Abuse History: None  Patient / Family Perceptions, Expectations & Goals Pt/Family understanding of illness & functional limitations: Pt and family with good understanding of her injury and currently functional limitations/ need for CIR. Premorbid pt/family roles/activities: Pt was living alone and independent.  Of note, she had several falls over that past year and family was becoming more concerned.  She was working still from home. Anticipated changes in roles/activities/participation: Pt has goals of supervision and may need to stay with family upon d/c.  Daughter assuming primary caregiver role. Pt/family expectations/goals: Daughter and son-in-law fully expect for pt to d/c to their home where it is a one level dwelling and they can provide needed supervision.  Pt currently a little reluctant to this plan.  Community Resources [employer]: None Premorbid Home Care/DME Agencies: None Transportation available at discharge: yes Resource referrals recommended: Neuropsychology  Discharge Planning Living Arrangements: Alone Support Systems: Children, Friends/neighbors Type of Residence: Private residence Insurance Resources: [employer], Multimedia programmer (specify) Web designer) Financial Resources: Employment, Radio broadcast assistant Screen Referred: No Living Expenses: Higher education careers adviser Management: Patient Does the patient have any problems obtaining your medications?: No Home Management: pt Patient/Family Preliminary  Plans: As noted, family prepared for pt to d/c to daughter's home Social Work Anticipated Follow Up Needs: HH/OP Expected length of stay: 2 weeks  Clinical Impression Pleasant woman here following a fall at home and with C5 fx.  Family very supportive and expressing concern about her plan to return to her own home alone (2 level).  They are discussing with her the better option of going home with daughter and son-in-law (1 level and 24/7 supervision available).  Pt c/o much pain and this does seem to limit participation at times.  Will follow along for support and d/c planning needs.  Demere Dotzler 12/23/2015, 12:20 PM

## 2015-12-23 NOTE — Progress Notes (Signed)
[facility]ICAL MEDICINE & REHABILITATION     PROGRESS NOTE    Subjective/Complaints: States she slept better. Still has neck pain but "dealing with it". Pain however seems to be an inhibiting and distracting factor with therapy  ROS: Pt denies fever, rash/itching,  blurred or double vision, nausea, vomiting, abdominal pain, diarrhea, chest pain, shortness of breath, palpitations, dysuria, dizziness, , bleeding, anxiety, or depression   Objective: Vital Signs: Blood pressure (!) 142/55, pulse 64, temperature 97.8 F (36.6 C), temperature source Oral, resp. rate 16, height 5\' 6"  (1.676 m), weight 88.1 kg (194 lb 4.8 oz), SpO2 95 %. No results found.  Recent Labs  12/21/15 0630  WBC 10.1  HGB 13.2  HCT 39.1  PLT 321    Recent Labs  12/21/15 0630  NA 138  K 3.3*  CL 100*  GLUCOSE 134*  BUN 11  CREATININE 0.62  CALCIUM 9.1   CBG (last 3)  No results for input(s): GLUCAP in the last 72 hours.  Wt Readings from Last 3 Encounters:  12/22/15 88.1 kg (194 lb 4.8 oz)  12/17/15 88.5 kg (195 lb)  11/24/15 89.4 kg (197 lb 3.2 oz)    Physical Exam:   HENT:  Head: Normocephalic.  Eyes: EOMare normal.  Cardiovascular: Normal rateand regular rhythm.  Respiratory: Effort normaland breath sounds normal. No respiratory distress.  GI: Soft. Bowel sounds are normal. She exhibits no distension.  Neurological:  Patient's eyes are open. Makes eye contact. More initiating and alert. Bradykinesias.  Moves all 4's without sign of focal weakness. Do not see focal sensory loss either.  Improved awareness today Skin: Skin is warmand dry.  Psychiatric:  Affect more dynamic. Restless today   Assessment/Plan: 1. Gait and functional deficits secondary to C5 Fx (hx of parkinson's disease) which require 3+ hours per day of interdisciplinary therapy in a comprehensive inpatient rehab setting. Physiatrist is providing close team supervision and 24 hour management of active medical  problems listed below. Physiatrist and rehab team continue to assess barriers to discharge/monitor patient progress toward functional and medical goals.  Function:  Bathing Bathing position   Position: Shower  Bathing parts Body parts bathed by patient: Right arm, Left arm, Chest, Abdomen, Front perineal area, Right upper leg, Left upper leg, Left lower leg, Buttocks Body parts bathed by helper: Back, Left lower leg  Bathing assist Assist Level: Touching or steadying assistance(Pt > 75%)      Upper Body Dressing/Undressing Upper body dressing   What is the patient wearing?: Pull over shirt/dress     Pull over shirt/dress - Perfomed by patient: Thread/unthread right sleeve, Thread/unthread left sleeve, Put head through opening   Button up shirt - Perfomed by patient: Thread/unthread right sleeve, Thread/unthread left sleeve, Button/unbutton shirt Button up shirt - Perfomed by helper: Pull shirt around back    Upper body assist Assist Level: Touching or steadying assistance(Pt > 75%)      Lower Body Dressing/Undressing Lower body dressing   What is the patient wearing?: Pants, Shoes, Underwear Underwear - Performed by patient: Thread/unthread left underwear leg, Pull underwear up/down Underwear - Performed by helper: Thread/unthread right underwear leg Pants- Performed by patient: Thread/unthread right pants leg, Thread/unthread left pants leg Pants- Performed by helper: Pull pants up/down     Socks - Performed by patient:  (able to doff not donn) Socks - Performed by helper: Don/doff right sock, Don/doff left sock Shoes - Performed by patient: Don/doff right shoe, Don/doff left shoe Shoes - Performed by helper:  Don/doff right shoe, Don/doff left shoe          Lower body assist Assist for lower body dressing: Touching or steadying assistance (Pt > 75%)      Toileting Toileting   Toileting steps completed by patient: Adjust clothing prior to toileting, Performs perineal  hygiene, Adjust clothing after toileting Toileting steps completed by helper: Adjust clothing prior to toileting, Adjust clothing after toileting, Performs perineal hygiene    Toileting assist Assist level: Supervision or verbal cues   Transfers Chair/bed transfer   Chair/bed transfer method: Ambulatory Chair/bed transfer assist level: Touching or steadying assistance (Pt > 75%) Chair/bed transfer assistive device: Armrests     Locomotion Ambulation     Max distance: 25 Assist level: Touching or steadying assistance (Pt > 75%)   Wheelchair   Type: Manual Max wheelchair distance: 150 Assist Level: Supervision or verbal cues  Cognition Comprehension Comprehension assist level: Understands basic 75 - 89% of the time/ requires cueing 10 - 24% of the time  Expression Expression assist level: Expresses basic 90% of the time/requires cueing < 10% of the time.  Social Interaction Social Interaction assist level: Interacts appropriately 75 - 89% of the time - Needs redirection for appropriate language or to initiate interaction.  Problem Solving Problem solving assist level: Solves basic 75 - 89% of the time/requires cueing 10 - 24% of the time  Memory Memory assist level: Recognizes or recalls 50 - 74% of the time/requires cueing 25 - 49% of the time   Medical Problem List and Plan: 1.  Debilitation secondary to C5 fracture after fall as well as history of Parkinson's disease. Cervical collar at all times             -continue CIR therapies 2.  DVT Prophylaxis/Anticoagulation: Chronic Coumadin for history of pulmonary emboli/hypercoagulable state. Monitor for any bleeding episodes 3. Pain Management: Oxycodone as needed  -robaxin PRN  -utilize ice/heat also--  -added LOW dose fentanyl patch 66mcg ---observe closely for tolerance 4. Mood: Cymbalta 60 mg daily 5. Neuropsych: This patient is capable of making decisions on her own behalf. 6. Skin/Wound Care: Routine skin checks 7.  Fluids/Electrolytes/Nutrition:     -replacing potassium 8. Parkinson disease. Sinemet 25-100 3 times a day. Followed by Dr. [name]  9. Hypertension. HCTZ 12.5 mg daily, Diovan 160 mg daily. Monitor with increased mobility2 10. Hypothyroidism. Synthroid. TSH 0.61 11. Hyperlipidemia. Lipitor, Lovaza   LOS (Days) 3 A FACE TO FACE EVALUATION WAS PERFORMED  [name] [name] T 12/23/2015 10:05 AM

## 2015-12-23 NOTE — Progress Notes (Signed)
Occupational Therapy Session Note  Patient Details  Name: [name] [name] MRN: [id number] Date of Birth: Mar 06, 1946  Today's Date: 12/23/2015 OT Individual Time: 1310-1355 OT Individual Time Calculation (min): 45 min     Short Term Goals: Week 1:  OT Short Term Goal 1 (Week 1): Pt will perform toileting with mod A sit to stand OT Short Term Goal 2 (Week 1): Pt will perform bed mobility with supervision without bed rails OT Short Term Goal 3 (Week 1): Pt will demonstrate intellecual awareness with min questioning cuing  OT Short Term Goal 4 (Week 1): Pt will demonstrate selective attention during functional tasks  Skilled Therapeutic Interventions/Progress Updates:   Patient was not able to recall cervical precautions and tended to turn head laterally when looking around the room.   Though she stated, "I am hurting too much to do anything else today," she concurred to work on toileting (close S) and toilet transfers (close S via w/c and grab bar) and lower body dressing (Mod verbal cues to not don Depends inside out over her right foot.   She was able to don black pants properly and pull them up thoroughly).     Patient had spilled her water on her blouse and pants before this clinician entered the room, but she stated, "It feels good cause I am a little warm.   I do not need to change clothes."    However, her very wet in the seat from the water spillage; so, she concurred to change at this clinician's suggestion.     At patient request to rest her "aching", she was assisted w/c-bed with close S.   Call bell and alarm were put in place.    Therapy Documentation Precautions:  Precautions Precautions: Fall, Cervical Required Braces or Orthoses: Cervical Brace Cervical Brace: Hard collar, At all times Restrictions Weight Bearing Restrictions: No Pain: "110/10" on a 1-10 pain scale.   "head and shoulders.   Hurts constantly"   RN gave pain meds   See Function Navigator for Current  Functional Status.   Therapy/Group: Individual Therapy  Alfredia Ferguson [facility] 12/23/2015, 2:33 PM

## 2015-12-23 NOTE — Progress Notes (Signed)
Speech Language Pathology Daily Session Note  Patient Details  Name: [name] [name] MRN: [id number] Date of Birth: 11-29-1945  Today's Date: 12/23/2015 SLP Individual Time: 1430-1505 SLP Individual Time Calculation (min): 35 min   Short Term Goals: Week 1: SLP Short Term Goal 1 (Week 1): Patient will demonstrate selective attention to functional task for 15 minutes given Min A verbal cues. SLP Short Term Goal 2 (Week 1): Patient will demonstrate mildly complex problem solving with functional tasks given Min A verbal/question cues.  SLP Short Term Goal 3 (Week 1): Patient will demonstrate intellectual awareness of 2 cognitive deficits given Min A question cues.  SLP Short Term Goal 4 (Week 1): Patient will demonstrate recall of new information given Min A verbal cues and the use external aids with 90% accuracy. SLP Short Term Goal 5 (Week 1): Patient will demonstrate emergent awareness of decreased frustration tolerance and request rest breaks when needed with Min A question and verbal cues.   Skilled Therapeutic Interventions: Skilled treatment session focused on cognitive goals. SLP facilitated session by providing Max-Total A verbal and question cues for recall of events from previous therapy sessions with use of external aids. Patient demonstrated intermittent confusion in regards to orientation to place throughout session but was easily reoriented with Min A question cues. Clinician initiated use of a memory book to maximize external aids to  Improve recall, orientation, and overall carryover of information. Patient asking appropriate questions in regards to d/c planning. Patient became tearful throughout discussion due to self-perceived burden she would be to her family. Patient provided emotional support. Patient left supine in bed with all needs within reach. Continue with current plan of care.   Function:  Cognition Comprehension Comprehension assist level: Understands basic 75 - 89% of  the time/ requires cueing 10 - 24% of the time  Expression   Expression assist level: Expresses basic 90% of the time/requires cueing < 10% of the time.  Social Interaction Social Interaction assist level: Interacts appropriately 75 - 89% of the time - Needs redirection for appropriate language or to initiate interaction.  Problem Solving Problem solving assist level: Solves basic 50 - 74% of the time/requires cueing 25 - 49% of the time  Memory Memory assist level: Recognizes or recalls 25 - 49% of the time/requires cueing 50 - 75% of the time    Pain No reports of pain   Therapy/Group: Individual Therapy  Ida Milbrath 12/23/2015, 3:25 PM

## 2015-12-24 ENCOUNTER — Inpatient Hospital Stay (HOSPITAL_COMMUNITY): Payer: Medicare Other | Admitting: Speech Pathology

## 2015-12-24 ENCOUNTER — Inpatient Hospital Stay (HOSPITAL_COMMUNITY): Payer: Medicare Other | Admitting: Physical Therapy

## 2015-12-24 ENCOUNTER — Inpatient Hospital Stay (HOSPITAL_COMMUNITY): Payer: Medicare Other | Admitting: Occupational Therapy

## 2015-12-24 DIAGNOSIS — S12490S Other displaced fracture of fifth cervical vertebra, sequela: Secondary | ICD-10-CM

## 2015-12-24 LAB — PROTIME-INR
INR: 2.56
Prothrombin Time: 28 seconds — ABNORMAL HIGH (ref 11.4–15.2)

## 2015-12-24 MED ORDER — MUSCLE RUB 10-15 % EX CREA
TOPICAL_CREAM | CUTANEOUS | Status: DC | PRN
  Administered 2015-12-24: 12:00:00 via TOPICAL
  Filled 2015-12-24: qty 85

## 2015-12-24 MED ORDER — WARFARIN SODIUM 3 MG PO TABS
3.0000 mg | ORAL_TABLET | Freq: Once | ORAL | Status: AC
  Administered 2015-12-24: 3 mg via ORAL
  Filled 2015-12-24: qty 1

## 2015-12-24 MED ORDER — GUAIFENESIN ER 600 MG PO TB12
600.0000 mg | ORAL_TABLET | Freq: Two times a day (BID) | ORAL | Status: DC
  Administered 2015-12-24 – 2016-01-01 (×17): 600 mg via ORAL
  Filled 2015-12-24 (×17): qty 1

## 2015-12-24 MED ORDER — OXYCODONE-ACETAMINOPHEN 7.5-325 MG PO TABS
1.0000 | ORAL_TABLET | Freq: Two times a day (BID) | ORAL | Status: DC
  Administered 2015-12-25 – 2015-12-29 (×9): 1 via ORAL
  Filled 2015-12-24 (×9): qty 1

## 2015-12-24 NOTE — Progress Notes (Signed)
Occupational Therapy Session Note  Patient Details  Name: Sydni Jensen MRN: [id number] Date of Birth: Dec 26, 1945  Today's Date: 12/24/2015 OT Individual Time: 1045-1200 OT Individual Time Calculation (min): 75 min     Short Term Goals: Week 1:  OT Short Term Goal 1 (Week 1): Pt will perform toileting with mod A sit to stand OT Short Term Goal 2 (Week 1): Pt will perform bed mobility with supervision without bed rails OT Short Term Goal 3 (Week 1): Pt will demonstrate intellecual awareness with min questioning cuing  OT Short Term Goal 4 (Week 1): Pt will demonstrate selective attention during functional tasks  Skilled Therapeutic Interventions/Progress Updates:   1:1 Pt with c/o intense pain and discomfort in neck and shoulders. Pt did agree to participate in bathing at shower level and dressed sitting on BSC in the bathroom. Pt continued to require mod cuing for working memory and problem solving. Pt continue to confuse staff members' names. Pt requires mod cuing for sequencing dressing due to disorganization of task. Pt able to ambulate with RW with increased speed and with steadying A to occasional supervision. Return to bed to change out c collar pads and remained in bed to rest. Filled out memory notebook with mod A.   Therapy Documentation Precautions:  Precautions Precautions: Fall, Cervical Required Braces or Orthoses: Cervical Brace Cervical Brace: Hard collar, At all times Restrictions Weight Bearing Restrictions: No General:   Vital Signs: Therapy Vitals Temp: 97.9 F (36.6 C) Temp Source: Oral Pulse Rate: 74 Resp: 18 BP: (!) 107/53 Patient Position (if appropriate): Lying Oxygen Therapy SpO2: 95 % O2 Device: Not Delivered Pain: Pain Assessment Pain Assessment: 0-10 Pain Score: 5  Faces Pain Scale: Hurts whole lot Pain Location: Neck Pain Orientation: Left Pain Descriptors / Indicators: Aching Pain Onset: On-going Patients Stated Pain Goal: 3 Pain  Intervention(s): Medication (See eMAR);Repositioned;Heat applied  See Function Navigator for Current Functional Status.   Therapy/Group: Individual Therapy  Willeen Cass [facility] 12/24/2015, 3:48 PM

## 2015-12-24 NOTE — Progress Notes (Signed)
Speech Language Pathology Daily Session Note  Patient Details  Name: [name] [name] MRN: [id number] Date of Birth: Apr 05, 1945  Today's Date: 12/24/2015 SLP Individual Time: 1300-1355 SLP Individual Time Calculation (min): 55 min   Short Term Goals: Week 1: SLP Short Term Goal 1 (Week 1): Patient will demonstrate selective attention to functional task for 15 minutes given Min A verbal cues. SLP Short Term Goal 2 (Week 1): Patient will demonstrate mildly complex problem solving with functional tasks given Min A verbal/question cues.  SLP Short Term Goal 3 (Week 1): Patient will demonstrate intellectual awareness of 2 cognitive deficits given Min A question cues.  SLP Short Term Goal 4 (Week 1): Patient will demonstrate recall of new information given Min A verbal cues and the use external aids with 90% accuracy. SLP Short Term Goal 5 (Week 1): Patient will demonstrate emergent awareness of decreased frustration tolerance and request rest breaks when needed with Min A question and verbal cues.   Skilled Therapeutic Interventions: Skilled treatment session focused on cognitive goals. SLP facilitated session by providing Mod A question cues for utilization of external memory aids to recall events from previous therapy sessions. Patient also required Mod-Max A verbal cues for sustained attention for ~60 seconds and sequencing during a basic task. However, suspect function was impacted by fatigue. RN made aware. Patient left supine in bed with all needs within reach. Continue with current plan of care.   Function:  Cognition Comprehension Comprehension assist level: Understands basic 75 - 89% of the time/ requires cueing 10 - 24% of the time  Expression   Expression assist level: Expresses basic 90% of the time/requires cueing < 10% of the time.  Social Interaction Social Interaction assist level: Interacts appropriately with others with medication or extra time (anti-anxiety, antidepressant).   Problem Solving Problem solving assist level: Solves basic 75 - 89% of the time/requires cueing 10 - 24% of the time  Memory Memory assist level: Recognizes or recalls 75 - 89% of the time/requires cueing 10 - 24% of the time    Pain Pain Assessment Pain Assessment: 0-10 Pain Score: 5  Faces Pain Scale: Hurts whole lot Pain Location: Neck Pain Orientation: Left Pain Descriptors / Indicators: Aching Pain Onset: On-going Patients Stated Pain Goal: 3 Pain Intervention(s): Medication (See eMAR);Repositioned;Heat applied  Therapy/Group: Individual Therapy  Emmanuelle Hibbitts 12/24/2015, 3:10 PM

## 2015-12-24 NOTE — Progress Notes (Signed)
[place] PHYSICAL MEDICINE & REHABILITATION     PROGRESS NOTE    Subjective/Complaints: "Slept like a baby last night". Pain better with addition of patch. In better spirits too.  ROS: Pt denies fever, rash/itching,  blurred or double vision, nausea, vomiting, abdominal pain, diarrhea, chest pain, shortness of breath, palpitations, dysuria, dizziness, , bleeding, anxiety, or depression   Objective: Vital Signs: Blood pressure (!) 127/55, pulse 63, temperature 97.7 F (36.5 C), temperature source Oral, resp. rate 18, height 5\' 6"  (1.676 m), weight 88.1 kg (194 lb 4.8 oz), SpO2 97 %. No results found. No results for input(s): WBC, HGB, HCT, PLT in the last 72 hours. No results for input(s): NA, K, CL, GLUCOSE, BUN, CREATININE, CALCIUM in the last 72 hours.  Invalid input(s): CO CBG (last 3)  No results for input(s): GLUCAP in the last 72 hours.  Wt Readings from Last 3 Encounters:  12/22/15 88.1 kg (194 lb 4.8 oz)  12/17/15 88.5 kg (195 lb)  11/24/15 89.4 kg (197 lb 3.2 oz)    Physical Exam:   HENT:  Head: Normocephalic.  Eyes: EOMare normal.  Cardiovascular: Normal rateand regular rhythm.  Respiratory: Effort normaland breath sounds normal. No respiratory distress.  GI: Soft. Bowel sounds are normal. She exhibits no distension.  Neurological:  Patient's eyes are open. Makes eye contact. More initiating and alert. Bradykinesias.  Moves all 4's without sign of focal weakness. Do not see focal sensory loss either.  Improved awareness today Skin: Skin is warmand dry.  Psychiatric:  Affect more dynamic. Restless today   Assessment/Plan: 1. Gait and functional deficits secondary to C5 Fx (hx of parkinson's disease) which require 3+ hours per day of interdisciplinary therapy in a comprehensive inpatient rehab setting. Physiatrist is providing close team supervision and 24 hour management of active medical problems listed below. Physiatrist and rehab team continue to  assess barriers to discharge/monitor patient progress toward functional and medical goals.  Function:  Bathing Bathing position   Position: Shower  Bathing parts Body parts bathed by patient: Right arm, Left arm, Chest, Abdomen, Front perineal area, Right upper leg, Left upper leg, Left lower leg, Buttocks Body parts bathed by helper: Back, Left lower leg  Bathing assist Assist Level: Touching or steadying assistance(Pt > 75%)      Upper Body Dressing/Undressing Upper body dressing   What is the patient wearing?: Pull over shirt/dress     Pull over shirt/dress - Perfomed by patient: Thread/unthread right sleeve, Thread/unthread left sleeve, Put head through opening   Button up shirt - Perfomed by patient: Thread/unthread right sleeve, Thread/unthread left sleeve, Button/unbutton shirt Button up shirt - Perfomed by helper: Pull shirt around back    Upper body assist Assist Level: Touching or steadying assistance(Pt > 75%)      Lower Body Dressing/Undressing Lower body dressing   What is the patient wearing?: Pants, Shoes, Underwear Underwear - Performed by patient: Thread/unthread left underwear leg, Pull underwear up/down Underwear - Performed by helper: Thread/unthread right underwear leg Pants- Performed by patient: Thread/unthread right pants leg, Thread/unthread left pants leg Pants- Performed by helper: Pull pants up/down     Socks - Performed by patient:  (able to doff not donn) Socks - Performed by helper: Don/doff right sock, Don/doff left sock Shoes - Performed by patient: Don/doff right shoe, Don/doff left shoe Shoes - Performed by helper: Don/doff right shoe, Don/doff left shoe          Lower body assist Assist for lower body dressing:  (  4/8, 50%, moderate assist)      Toileting Toileting   Toileting steps completed by patient: Adjust clothing prior to toileting, Performs perineal hygiene, Adjust clothing after toileting Toileting steps completed by helper:  Adjust clothing prior to toileting, Adjust clothing after toileting (per Gilman Buttner, NT) Toileting Assistive Devices: Grab bar or rail  Toileting assist Assist level: Supervision or verbal cues   Transfers Chair/bed transfer   Chair/bed transfer method: Ambulatory Chair/bed transfer assist level: Touching or steadying assistance (Pt > 75%) Chair/bed transfer assistive device: Armrests     Locomotion Ambulation     Max distance: 25 Assist level: Touching or steadying assistance (Pt > 75%)   Wheelchair   Type: Manual Max wheelchair distance: 150 Assist Level: Supervision or verbal cues  Cognition Comprehension Comprehension assist level: Understands basic 75 - 89% of the time/ requires cueing 10 - 24% of the time  Expression Expression assist level: Expresses basic 75 - 89% of the time/requires cueing 10 - 24% of the time. Needs helper to occlude trach/needs to repeat words.  Social Interaction Social Interaction assist level: Interacts appropriately 75 - 89% of the time - Needs redirection for appropriate language or to initiate interaction.  Problem Solving Problem solving assist level: Solves basic 75 - 89% of the time/requires cueing 10 - 24% of the time  Memory Memory assist level: Recognizes or recalls 75 - 89% of the time/requires cueing 10 - 24% of the time   Medical Problem List and Plan: 1.  Debilitation secondary to C5 fracture after fall as well as history of Parkinson's disease. Cervical collar at all times             -continue PT, OT  -improved activity tolerance 2.  DVT Prophylaxis/Anticoagulation: Chronic Coumadin for history of pulmonary emboli/hypercoagulable state. Monitor for any bleeding episodes 3. Pain Management: Oxycodone as needed  -robaxin PRN  -utilize ice/heat also--  -added LOW dose fentanyl patch 40mcg--seems to be doing well with it so far ---observe closely for tolerance 4. Mood: Cymbalta 60 mg daily  -appreciate neuropsych input  -reviewed  with pt today. She feels that any depression in her mood has been related to increased pain. Feels better emotionally with better pain control. 5. Neuropsych: This patient is capable of making decisions on her own behalf. 6. Skin/Wound Care: Routine skin checks 7. Fluids/Electrolytes/Nutrition:     -replacing potassium  -recheck Monday 8. Parkinson disease. Sinemet 25-100 3 times a day. Followed by Dr. [name]  9. Hypertension. HCTZ 12.5 mg daily, Diovan 160 mg daily. Monitor with increased mobility2 10. Hypothyroidism. Synthroid. TSH 0.61 11. Hyperlipidemia. Lipitor, Lovaza   LOS (Days) 4 A FACE TO FACE EVALUATION WAS PERFORMED  Ellington Greenslade T 12/24/2015 9:24 AM

## 2015-12-24 NOTE — Progress Notes (Signed)
Physical Therapy Session Note  Patient Details  Name: [name] [name] MRN: [id number] Date of Birth: 06/09/1945  Today's Date: 12/24/2015 PT Individual Time: 0900-1000 PT Individual Time Calculation (min): 60 min    Short Term Goals: Week 1:  PT Short Term Goal 1 (Week 1): Pt will perform bed mobility on flat bed with S PT Short Term Goal 2 (Week 1): Pt will perform stand pivot transfers with consistent min guard PT Short Term Goal 3 (Week 1): Pt will perform gait x50' with RW and min guard PT Short Term Goal 4 (Week 1): Pt will initiate stair training  Skilled Therapeutic Interventions/Progress Updates:   Pt received supine in bed, c/o pain as below and agreeable to treatment. Pt reports she found out she has not received pain medication in several days and feels much better now that she has. Pt ambulated in/out of bathroom with RW and close S. Performed clothing management and hygiene with S, and pt stood from toilet without assist despite requesting that pt alert therapist before standing. Pt ambulated to/from room 4x90' total with one rest break per trial due to fatigue, pain. Seated gastroc stretch with leg lifter and required minA to maintain straight knee for effective stretch. Performed x210 heel/toe raises in standing with light UE support on RW. Standing balance on red balance wedge with cues for anterior weight shift. Pt with difficulty following commands for foot placement and lifting hands off RW, with pt perseverative on pain and fatigue. Returned to room as above; remained supine in bed with alarm intact and all needs in reach.   Therapy Documentation Precautions:  Precautions Precautions: Fall, Cervical Required Braces or Orthoses: Cervical Brace Cervical Brace: Hard collar, At all times Restrictions Weight Bearing Restrictions: No Pain: Pain Assessment Pain Assessment: 0-10 Pain Score: 5  Pain Location: Neck Pain Descriptors / Indicators: Aching;Cramping Patients Stated  Pain Goal: 3 Pain Intervention(s): Medication (See eMAR)   See Function Navigator for Current Functional Status.   Therapy/Group: Individual Therapy  Luberta Mutter 12/24/2015, 9:49 AM

## 2015-12-24 NOTE — Progress Notes (Signed)
ANTICOAGULATION CONSULT NOTE - Follow Up Consult  Pharmacy Consult for coumadin Indication: hypercoagulable state  Allergies  Allergen Reactions  . Crestor [Rosuvastatin Calcium] Other (See Comments)    Feeling very bad, aching all over.  . Erythromycin Hives  . Gabapentin Other (See Comments)    Blurred vision  . Pregabalin Other (See Comments)    Crossed vision.  . Carbidopa Other (See Comments)    Headaches, nausea, dizziness  . Macrobid [Nitrofurantoin] Hives  . Losartan Other (See Comments)    Patient Measurements: Height: 5\' 6"  (167.6 cm) Weight: 194 lb 4.8 oz (88.1 kg) IBW/kg (Calculated) : 59.3 Heparin Dosing Weight:   Vital Signs: Temp: 97.7 F (36.5 C) (10/20 0438) Temp Source: Oral (10/20 0438) BP: 127/55 (10/20 0438) Pulse Rate: 63 (10/20 0438)  Labs:  Recent Labs  12/22/15 0949 12/23/15 0618  LABPROT 28.3* 30.5*  INR 2.60 2.85    Estimated Creatinine Clearance: 71 mL/min (by C-G formula based on SCr of 0.62 mg/dL).   Medications:  Scheduled:  . aspirin EC  81 mg Oral QPC breakfast  . atorvastatin  10 mg Oral Daily  . azelastine  1 spray Each Nare BID  . Carbidopa-Levodopa ER  1 tablet Oral TID  . celecoxib  100 mg Oral BID  . cycloSPORINE  1 drop Both Eyes BID  . DULoxetine  60 mg Oral Daily  . fentaNYL  12.5 mcg Transdermal Q72H  . valsartan  160 mg Oral Daily   And  . hydrochlorothiazide  12.5 mg Oral Daily  . levothyroxine  150 mcg Oral QAC breakfast  . magnesium oxide  200 mg Oral Daily  . multivitamin with minerals  1 tablet Oral Daily  . omega-3 acid ethyl esters  1 g Oral Daily  . potassium chloride  20 mEq Oral Daily  . senna  1 tablet Oral Daily  . Warfarin - Pharmacist Dosing Inpatient   Does not apply q1800  . zolpidem  5 mg Oral QHS   Infusions:    Assessment: 70 yo female with hypercoagulable state is currently on therapeutic coumadin.  INR today is 2.56.  Goal of Therapy:  INR 2-3 Monitor platelets by  anticoagulation protocol: Yes   Plan:  - coumadin 3 mg po x1 - INR in am  Shjon Lizarraga, Tsz-Yin 12/24/2015,8:28 AM

## 2015-12-25 ENCOUNTER — Inpatient Hospital Stay (HOSPITAL_COMMUNITY): Payer: Medicare Other | Admitting: Occupational Therapy

## 2015-12-25 ENCOUNTER — Inpatient Hospital Stay (HOSPITAL_COMMUNITY): Payer: Medicare Other

## 2015-12-25 ENCOUNTER — Inpatient Hospital Stay (HOSPITAL_COMMUNITY): Payer: Medicare Other | Admitting: Speech Pathology

## 2015-12-25 DIAGNOSIS — G8929 Other chronic pain: Secondary | ICD-10-CM

## 2015-12-25 DIAGNOSIS — M545 Low back pain: Secondary | ICD-10-CM

## 2015-12-25 LAB — PROTIME-INR
INR: 2.52
Prothrombin Time: 27.7 seconds — ABNORMAL HIGH (ref 11.4–15.2)

## 2015-12-25 MED ORDER — WARFARIN SODIUM 3 MG PO TABS
3.0000 mg | ORAL_TABLET | Freq: Once | ORAL | Status: AC
  Administered 2015-12-25: 3 mg via ORAL
  Filled 2015-12-25: qty 1

## 2015-12-25 NOTE — Plan of Care (Signed)
Problem: RH BOWEL ELIMINATION Goal: RH STG MANAGE BOWEL W/MEDICATION W/ASSISTANCE STG Manage Bowel with Medication with mod I Assistance.   Outcome: Not Progressing No BM in 3 days.

## 2015-12-25 NOTE — Progress Notes (Signed)
ANTICOAGULATION CONSULT NOTE - Follow Up Consult  Pharmacy Consult for coumadin Indication: hypercoagulable state  Allergies  Allergen Reactions  . Crestor [Rosuvastatin Calcium] Other (See Comments)    Feeling very bad, aching all over.  . Erythromycin Hives  . Gabapentin Other (See Comments)    Blurred vision  . Pregabalin Other (See Comments)    Crossed vision.  . Carbidopa Other (See Comments)    Headaches, nausea, dizziness  . Macrobid [Nitrofurantoin] Hives  . Losartan Other (See Comments)    Patient Measurements: Height: 5\' 6"  (167.6 cm) Weight: 194 lb 4.8 oz (88.1 kg) IBW/kg (Calculated) : 59.3 Heparin Dosing Weight:   Vital Signs: Temp: 97.8 F (36.6 C) (10/21 0526) Temp Source: Oral (10/21 0526) BP: 111/54 (10/21 1024)  Labs:  Recent Labs  12/23/15 0618 12/24/15 0912 12/25/15 0957  LABPROT 30.5* 28.0* 27.7*  INR 2.85 2.56 2.52    Estimated Creatinine Clearance: 71 mL/min (by C-G formula based on SCr of 0.62 mg/dL).   Medications:  Scheduled:  . aspirin EC  81 mg Oral QPC breakfast  . atorvastatin  10 mg Oral Daily  . azelastine  1 spray Each Nare BID  . Carbidopa-Levodopa ER  1 tablet Oral TID  . celecoxib  100 mg Oral BID  . cycloSPORINE  1 drop Both Eyes BID  . DULoxetine  60 mg Oral Daily  . fentaNYL  12.5 mcg Transdermal Q72H  . guaiFENesin  600 mg Oral BID  . valsartan  160 mg Oral Daily   And  . hydrochlorothiazide  12.5 mg Oral Daily  . levothyroxine  150 mcg Oral QAC breakfast  . magnesium oxide  200 mg Oral Daily  . multivitamin with minerals  1 tablet Oral Daily  . omega-3 acid ethyl esters  1 g Oral Daily  . oxyCODONE-acetaminophen  1 tablet Oral BID  . potassium chloride  20 mEq Oral Daily  . senna  1 tablet Oral Daily  . Warfarin - Pharmacist Dosing Inpatient   Does not apply q1800  . zolpidem  5 mg Oral QHS   Infusions:    Assessment: 70 yo female with hypercoagulable state and history of PE is currently on therapeutic  coumadin.  INR today is 2.52. Last CBC on 10/17 was stable. PTA dose was wafarin 4mg  daily, but patient has been stable on 3mg  dose thus far while inpatient. Will continue 3mg  dose and monitor.   Goal of Therapy:  INR 2-3 Monitor platelets by anticoagulation protocol: Yes   Plan:  - Warfarin 3 mg PO x1 - Daily INRs and CBC as indicated - Monitor for signs/symptoms of bleeding  Demetrius Charity, PharmD Acute Care Pharmacy Resident  Pager: [phone] 12/25/2015

## 2015-12-25 NOTE — Progress Notes (Signed)
[place] PHYSICAL MEDICINE & REHABILITATION     PROGRESS NOTE    Subjective/Complaints: Pt sitting up in bed this AM.  She states she is doing well.  She notes she hasn't had a BM in a few days, but feels she will have to go soon.   ROS: Denies CP, SOB, N/V/D.  Objective: Vital Signs: Blood pressure (!) 111/54, pulse 74, temperature 97.8 F (36.6 C), temperature source Oral, resp. rate 16, height 5\' 6"  (1.676 m), weight 88.1 kg (194 lb 4.8 oz), SpO2 98 %. No results found. No results for input(s): WBC, HGB, HCT, PLT in the last 72 hours. No results for input(s): NA, K, CL, GLUCOSE, BUN, CREATININE, CALCIUM in the last 72 hours.  Invalid input(s): CO CBG (last 3)  No results for input(s): GLUCAP in the last 72 hours.  Wt Readings from Last 3 Encounters:  12/22/15 88.1 kg (194 lb 4.8 oz)  12/17/15 88.5 kg (195 lb)  11/24/15 89.4 kg (197 lb 3.2 oz)    Physical Exam:  Gen: Well developed. NAD.  HENT: Normocephalic. Atraumatic. Eyes: EOMare normal. No discharge.  Cardiovascular: Normal rateand regular rhythm.  Respiratory: Effort normaland breath sounds normal. No respiratory distress.  GI: Soft. Bowel sounds are normal. She exhibits no distension.  Neurological: Alert and oriented. Motor: Grossly 4+/5 throughout Skin: Skin is warmand dry.  Psychiatric: Normal mood and affect.  Assessment/Plan: 1. Gait and functional deficits secondary to C5 Fx (hx of parkinson's disease) which require 3+ hours per day of interdisciplinary therapy in a comprehensive inpatient rehab setting. Physiatrist is providing close team supervision and 24 hour management of active medical problems listed below. Physiatrist and rehab team continue to assess barriers to discharge/monitor patient progress toward functional and medical goals.  Function:  Bathing Bathing position   Position: Shower  Bathing parts Body parts bathed by patient: Right arm, Left arm, Chest, Abdomen, Front perineal  area, Right upper leg, Left upper leg, Left lower leg, Buttocks Body parts bathed by helper: Back, Left lower leg  Bathing assist Assist Level: Touching or steadying assistance(Pt > 75%)      Upper Body Dressing/Undressing Upper body dressing   What is the patient wearing?: Pull over shirt/dress     Pull over shirt/dress - Perfomed by patient: Thread/unthread right sleeve, Thread/unthread left sleeve, Put head through opening   Button up shirt - Perfomed by patient: Thread/unthread right sleeve, Thread/unthread left sleeve, Button/unbutton shirt Button up shirt - Perfomed by helper: Pull shirt around back    Upper body assist Assist Level: Touching or steadying assistance(Pt > 75%)      Lower Body Dressing/Undressing Lower body dressing   What is the patient wearing?: Pants, Shoes, Underwear Underwear - Performed by patient: Thread/unthread left underwear leg, Pull underwear up/down Underwear - Performed by helper: Thread/unthread right underwear leg Pants- Performed by patient: Thread/unthread right pants leg, Thread/unthread left pants leg Pants- Performed by helper: Pull pants up/down     Socks - Performed by patient:  (able to doff not donn) Socks - Performed by helper: Don/doff right sock, Don/doff left sock Shoes - Performed by patient: Don/doff right shoe, Don/doff left shoe Shoes - Performed by helper: Don/doff right shoe, Don/doff left shoe          Lower body assist Assist for lower body dressing:  (4/8, 50%, moderate assist)      Toileting Toileting   Toileting steps completed by patient: Adjust clothing prior to toileting, Performs perineal hygiene, Adjust clothing after toileting  Toileting steps completed by helper: Adjust clothing prior to toileting, Adjust clothing after toileting (per Gilman Buttner, NT) Toileting Assistive Devices: Grab bar or rail  Toileting assist Assist level: Supervision or verbal cues   Transfers Chair/bed transfer   Chair/bed  transfer method: Ambulatory Chair/bed transfer assist level: Touching or steadying assistance (Pt > 75%) Chair/bed transfer assistive device: Walker, Air cabin crew     Max distance: 90 Assist level: Touching or steadying assistance (Pt > 75%)   Wheelchair   Type: Manual Max wheelchair distance: 150 Assist Level: Supervision or verbal cues  Cognition Comprehension Comprehension assist level: Understands basic 75 - 89% of the time/ requires cueing 10 - 24% of the time  Expression Expression assist level: Expresses basic 90% of the time/requires cueing < 10% of the time.  Social Interaction Social Interaction assist level: Interacts appropriately with others with medication or extra time (anti-anxiety, antidepressant).  Problem Solving Problem solving assist level: Solves basic 75 - 89% of the time/requires cueing 10 - 24% of the time  Memory Memory assist level: Recognizes or recalls 75 - 89% of the time/requires cueing 10 - 24% of the time   Medical Problem List and Plan: 1.  Debilitation secondary to C5 fracture after fall as well as history of Parkinson's disease. Cervical collar at all times             -continue PT, OT  -improved activity tolerance 2.  DVT Prophylaxis/Anticoagulation: Chronic Coumadin for history of pulmonary emboli/hypercoagulable state. Monitor for any bleeding episodes 3. Pain Management: Oxycodone as needed  -robaxin PRN  -utilize ice/heat also  -added low dose fentanyl patch 4mcg 4. Mood: Cymbalta 60 mg daily  -appreciate neuropsych input  Pt feels that any depression in her mood has been related to increased pain. Feels better emotionally with better pain control. 5. Neuropsych: This patient is capable of making decisions on her own behalf. 6. Skin/Wound Care: Routine skin checks 7. Fluids/Electrolytes/Nutrition:    8. Parkinson disease. Sinemet 25-100 3 times a day. Followed by Dr. [name]  9. Hypertension. HCTZ 12.5 mg daily, Diovan  160 mg daily. Monitor with increased mobility2 10. Hypothyroidism. Synthroid. TSH 0.61 11. Hyperlipidemia. Lipitor, Lovaza 12. Hypokalemia  Supplemented  Labs ordered for Monday   LOS (Days) 5 A FACE TO FACE EVALUATION WAS PERFORMED  Ankit Lorie Phenix 12/25/2015 12:35 PM

## 2015-12-25 NOTE — Plan of Care (Signed)
Problem: RH PAIN MANAGEMENT Goal: RH STG PAIN MANAGED AT OR BELOW PT'S PAIN GOAL Outcome: Progressing < 3 on pain scale

## 2015-12-25 NOTE — Progress Notes (Signed)
Occupational Therapy Session Note  Patient Details  Name: [name] [name] MRN: [id number] Date of Birth: 1945/08/13  Today's Date: 12/25/2015 OT Individual Time: [id number]  OT Individual Time Calculation (min): 54 min    Short Term Goals: Week 1:  OT Short Term Goal 1 (Week 1): Pt will perform toileting with mod A sit to stand OT Short Term Goal 2 (Week 1): Pt will perform bed mobility with supervision without bed rails OT Short Term Goal 3 (Week 1): Pt will demonstrate intellecual awareness with min questioning cuing  OT Short Term Goal 4 (Week 1): Pt will demonstrate selective attention during functional tasks  Skilled Therapeutic Interventions/Progress Updates:   Pt participated in skilled OT session focusing on standing endurance, cognition, and attention during functional activities. Pt was in bed with son present at time of arrival. Pt reported neck pain and nursing was notified; pt was medicated prior to tx. Afterwards pt was agreeable to complete therapy Bed<w/c transfer completed with Min A and max instruction on technique with pt attempting to sit on edge of sink. Pt self propelled to [facility] gym with supervision for cues for spatial awareness. Pt participated in standing task with RW involving sequencing, problem solving, and attention with mod vcs to attend to task. Pt stood for 1 minute intervals before needing to sit. Elvis music utilized for pain mgt due to restlessness and pt frequently rubbing underneath c-collar with cues for keeping it in place to adhere to precautions. Afterwards pt self propelled back to room and was unable to pathfind without cues. Pt was returned to bed. At this time new c-collar was provided. This therapist switched out collars with new one appearing to fit pt much better. Pt was left with son and all needs within reach.   Therapy Documentation Precautions:  Precautions Precautions: Fall, Cervical Required Braces or Orthoses: Cervical Brace Cervical Brace:  Hard collar, At all times Restrictions Weight Bearing Restrictions: No General:   Vital Signs: Therapy Vitals Temp: 97.8 F (36.6 C) Temp Source: Oral Pulse Rate: 90 Resp: 17 BP: (!) 123/49 Patient Position (if appropriate): Sitting Oxygen Therapy SpO2: 96 % O2 Device: Not Delivered Pain: Pt medicated prior to tx, therapeutic use of music utilized  Pain Assessment Pain Assessment: 0-10 Pain Score: 8  Pain Type: Acute pain Pain Location: Neck Pain Orientation: Posterior Pain Onset: On-going Pain Intervention(s): RN made aware;Other (Comment) (requested OT address neck brace) Multiple Pain Sites: No ADL:   Exercises:   Other Treatments:    See Function Navigator for Current Functional Status.   Therapy/Group: Individual Therapy  Zetta Stoneman A Rosalena Mccorry 12/25/2015, 4:35 PM

## 2015-12-25 NOTE — Progress Notes (Signed)
Occupational Therapy Session Note  Patient Details  Name: [name] [name] MRN: [id number] Date of Birth: December 05, 1945  Today's Date: 12/25/2015 OT Individual Time: 0900-1000 OT Individual Time Calculation (min): 60 min   Short Term Goals: Week 1:  OT Short Term Goal 1 (Week 1): Pt will perform toileting with mod A sit to stand OT Short Term Goal 2 (Week 1): Pt will perform bed mobility with supervision without bed rails OT Short Term Goal 3 (Week 1): Pt will demonstrate intellecual awareness with min questioning cuing  OT Short Term Goal 4 (Week 1): Pt will demonstrate selective attention during functional tasksth   Skilled Therapeutic Interventions/Progress Updates:   ADL-retraining with focus on orthotics management, tranfers, adapted bathing/dressing skills, toileting, and static standing balance.   Pt received supine in bed with c-collar partially secured and partially disassembled.   After setup to insure pt was supported properly while remaining still and supine, OT inspected collar and noted 2 fasteners missing.   OT provided expedient repair using surgical tape and requested replacement collar through ward admin assistant.   Shower deferred d/t collar repair however pt was able to ambulate to bathroom, toilet, and bathe/dress at sink with c-collar secure and comfortably fitted.   Pt required overall min assist for self-care and min vc to progress through task.    Pt left in w/c at end of session with c-collar on and secure.  Therapy Documentation Precautions:  Precautions Precautions: Fall, Cervical Required Braces or Orthoses: Cervical Brace Cervical Brace: Hard collar, At all times Restrictions Weight Bearing Restrictions: No   Pain: Pain Assessment Pain Assessment: 0-10 Pain Score: 0-No pain  See Function Navigator for Current Functional Status.   Therapy/Group: Individual Therapy   Second session: Time: 1500-1530 Time Calculation (min):  30 min  Pain Assessment: 5/10,  neck, d/t improper fit to C-collar (adjusted to improve comfort).  Skilled Therapeutic Interventions: Therapeutic activity with focus on improved compliance with use of C-collar and patient/family ed on use of cotton sheets to replace synthetic sheets d/t pt complaint of poor thermoregulation and sweats while supine.   Per staff alert, pt reports discomfort with provision of replacement c-collar recommended from earlier session.   OT received pt in her room with family present and C-collar misaligned, not supporting her chin.  Pt instructed to return to bed, to lay supine and remain still as therapist re-adjusted collar.  During session, RN tech arrived to report request from orthotics provider to return c-collar provided to facility to enable replacement to insure improved fit.   With pt supine, OT removed c-collar while supporting pt's neck and replaced it with her original [employer] collar after adjusting chin support to provide better support.   Pt then returned to edge of bed and completed transfer back to w/c with steadying assist to allow replacement of synthetic sheets with cotton sheets.   Pt returned to bed and recovered to supine with steadying assist.  Pt able to perform bed mobility with min instructional cues and she was left with family attending to all needs.   See FIM for current functional status  Therapy/Group: Individual Therapy  [name] 12/25/2015, 10:04 AM

## 2015-12-25 NOTE — Progress Notes (Signed)
Speech Language Pathology Daily Session Note  Patient Details  Name: [name] [name] MRN: [id number] Date of Birth: 1945-09-07  Today's Date: 12/25/2015 SLP Individual Time: 1415-1445 SLP Individual Time Calculation (min): 30 min   Short Term Goals: Week 1: SLP Short Term Goal 1 (Week 1): Patient will demonstrate selective attention to functional task for 15 minutes given Min A verbal cues. SLP Short Term Goal 2 (Week 1): Patient will demonstrate mildly complex problem solving with functional tasks given Min A verbal/question cues.  SLP Short Term Goal 3 (Week 1): Patient will demonstrate intellectual awareness of 2 cognitive deficits given Min A question cues.  SLP Short Term Goal 4 (Week 1): Patient will demonstrate recall of new information given Min A verbal cues and the use external aids with 90% accuracy. SLP Short Term Goal 5 (Week 1): Patient will demonstrate emergent awareness of decreased frustration tolerance and request rest breaks when needed with Min A question and verbal cues.   Skilled Therapeutic Interventions:   Skilled treatment session focused on addressing cognition goals. Upon SLP arrival patient reported needing to go to the bathroom; patient ambulated with rolling walker requiring Min physical assist, and Min verbal cues to problem solve safe use of walker.  Cues for hand placement and keeping the walk on the ground were needed for safety.  SLP also facilitated session by providing extra time and Mod cues to initiate a complex problem solving task.  Additionally, patient required Mod assist verbal and visual cues to accurately enter information.  At session end patient had documented two appointments in schedule, recommend to continue at next visit.  Continue with current plan of care.    Function:   Cognition Comprehension Comprehension assist level: Understands basic 75 - 89% of the time/ requires cueing 10 - 24% of the time  Expression   Expression assist level:  Expresses basic 90% of the time/requires cueing < 10% of the time.  Social Interaction Social Interaction assist level: Interacts appropriately 90% of the time - Needs monitoring or encouragement for participation or interaction.  Problem Solving Problem solving assist level: Solves basic 75 - 89% of the time/requires cueing 10 - 24% of the time  Memory Memory assist level: Recognizes or recalls 75 - 89% of the time/requires cueing 10 - 24% of the time    Pain Pain Assessment Pain Assessment: 0-10 Pain Score: 8  Pain Type: Acute pain Pain Location: Neck Pain Orientation: Posterior Pain Onset: On-going Pain Intervention(s): RN made aware;Other (Comment) (requested OT address neck brace) Multiple Pain Sites: No  Therapy/Group: Individual Therapy  [name]., CCC-SLP [id number]  [place] 12/25/2015, 3:48 PM

## 2015-12-26 DIAGNOSIS — I10 Essential (primary) hypertension: Secondary | ICD-10-CM

## 2015-12-26 LAB — PROTIME-INR
INR: 2.42
PROTHROMBIN TIME: 26.7 s — AB (ref 11.4–15.2)

## 2015-12-26 MED ORDER — WARFARIN SODIUM 3 MG PO TABS
3.0000 mg | ORAL_TABLET | Freq: Every day | ORAL | Status: DC
  Administered 2015-12-26 – 2015-12-31 (×6): 3 mg via ORAL
  Filled 2015-12-26 (×6): qty 1

## 2015-12-26 NOTE — Progress Notes (Signed)
LBM 12/20/15, PRN sorbitol given on previous shift and dose given this morning . BS (+)  X4 quads. Patient Removed brace at HS. PRN [name] given per patients request at 2041. [name] [name] A

## 2015-12-26 NOTE — Progress Notes (Addendum)
ANTICOAGULATION CONSULT NOTE - Follow Up Consult  Pharmacy Consult for coumadin Indication: hypercoagulable state  Allergies  Allergen Reactions  . Crestor [Rosuvastatin Calcium] Other (See Comments)    Feeling very bad, aching all over.  . Erythromycin Hives  . Gabapentin Other (See Comments)    Blurred vision  . Pregabalin Other (See Comments)    Crossed vision.  . Carbidopa Other (See Comments)    Headaches, nausea, dizziness  . Macrobid [Nitrofurantoin] Hives  . Losartan Other (See Comments)    Patient Measurements: Height: 5\' 6"  (167.6 cm) Weight: 194 lb 4.8 oz (88.1 kg) IBW/kg (Calculated) : 59.3 Heparin Dosing Weight:   Vital Signs: Temp: 97.9 F (36.6 C) (10/22 0514) Temp Source: Oral (10/22 0514) BP: 116/54 (10/22 0514) Pulse Rate: 79 (10/22 0514)  Labs:  Recent Labs  12/24/15 0912 12/25/15 0957 12/26/15 0455  LABPROT 28.0* 27.7* 26.7*  INR 2.56 2.52 2.42    Estimated Creatinine Clearance: 71 mL/min (by C-G formula based on SCr of 0.62 mg/dL).   Medications:  Scheduled:  . aspirin EC  81 mg Oral QPC breakfast  . atorvastatin  10 mg Oral Daily  . azelastine  1 spray Each Nare BID  . Carbidopa-Levodopa ER  1 tablet Oral TID  . celecoxib  100 mg Oral BID  . cycloSPORINE  1 drop Both Eyes BID  . DULoxetine  60 mg Oral Daily  . fentaNYL  12.5 mcg Transdermal Q72H  . guaiFENesin  600 mg Oral BID  . valsartan  160 mg Oral Daily   And  . hydrochlorothiazide  12.5 mg Oral Daily  . levothyroxine  150 mcg Oral QAC breakfast  . magnesium oxide  200 mg Oral Daily  . multivitamin with minerals  1 tablet Oral Daily  . omega-3 acid ethyl esters  1 g Oral Daily  . oxyCODONE-acetaminophen  1 tablet Oral BID  . potassium chloride  20 mEq Oral Daily  . senna  1 tablet Oral Daily  . Warfarin - Pharmacist Dosing Inpatient   Does not apply q1800  . zolpidem  5 mg Oral QHS   Infusions:    Assessment: 70 yo female with hypercoagulable state and history of PE  is currently on therapeutic coumadin.  INR today is 2.42. Last CBC on 10/17 was stable. No notes of bleeding. PTA dose was wafarin 4mg  daily, but patient has been stable on 3mg  dose thus far while inpatient.   Will continue 3mg  dose and given that patient has been stable on this dose for 6 days now, will change INR monitoring to 3 times per week.  Goal of Therapy:  INR 2-3 Monitor platelets by anticoagulation protocol: Yes   Plan:  - Warfarin 3 mg PO daily - MWF INRs and CBC as indicated - Monitor for signs/symptoms of bleeding  Demetrius Charity, PharmD Acute Care Pharmacy Resident  Pager: [phone] 12/26/2015

## 2015-12-26 NOTE — Progress Notes (Signed)
[place] PHYSICAL MEDICINE & REHABILITATION     PROGRESS NOTE    Subjective/Complaints: Pt seen laying in bed this AM. He C-Collar is displaced.    ROS: Denies CP, SOB, N/V/D.  Objective: Vital Signs: Blood pressure 126/66, pulse 79, temperature 97.9 F (36.6 C), temperature source Oral, resp. rate 18, height 5\' 6"  (1.676 m), weight 88.1 kg (194 lb 4.8 oz), SpO2 97 %. No results found. No results for input(s): WBC, HGB, HCT, PLT in the last 72 hours. No results for input(s): NA, K, CL, GLUCOSE, BUN, CREATININE, CALCIUM in the last 72 hours.  Invalid input(s): CO CBG (last 3)  No results for input(s): GLUCAP in the last 72 hours.  Wt Readings from Last 3 Encounters:  12/22/15 88.1 kg (194 lb 4.8 oz)  12/17/15 88.5 kg (195 lb)  11/24/15 89.4 kg (197 lb 3.2 oz)    Physical Exam:  Gen: Well developed. NAD.  HENT: Normocephalic. Atraumatic. Eyes: EOMare normal. No discharge.  Neck: C-collar adjusted. Cardiovascular: Normal rateand regular rhythm.  Respiratory: Effort normaland breath sounds normal. No respiratory distress.  GI: Soft. Bowel sounds are normal. She exhibits no distension.  Neurological: Alert and oriented. Motor: Grossly 4+/5 throughout Skin: Skin is warmand dry.  Psychiatric: Normal mood and affect.  Assessment/Plan: 1. Gait and functional deficits secondary to C5 Fx (hx of parkinson's disease) which require 3+ hours per day of interdisciplinary therapy in a comprehensive inpatient rehab setting. Physiatrist is providing close team supervision and 24 hour management of active medical problems listed below. Physiatrist and rehab team continue to assess barriers to discharge/monitor patient progress toward functional and medical goals.  Function:  Bathing Bathing position   Position: Wheelchair/chair at sink  Bathing parts Body parts bathed by patient: Right arm, Left arm, Chest, Abdomen, Front perineal area, Buttocks, Right upper leg, Left upper  leg Body parts bathed by helper: Right lower leg, Left lower leg, Back  Bathing assist Assist Level: Touching or steadying assistance(Pt > 75%)      Upper Body Dressing/Undressing Upper body dressing   What is the patient wearing?: Pull over shirt/dress     Pull over shirt/dress - Perfomed by patient: Thread/unthread right sleeve, Thread/unthread left sleeve, Put head through opening Pull over shirt/dress - Perfomed by helper: Pull shirt over trunk Button up shirt - Perfomed by patient: Thread/unthread right sleeve, Thread/unthread left sleeve, Button/unbutton shirt Button up shirt - Perfomed by helper: Pull shirt around back    Upper body assist Assist Level: Touching or steadying assistance(Pt > 75%)      Lower Body Dressing/Undressing Lower body dressing   What is the patient wearing?: Non-skid slipper socks, Underwear, Pants, [employer]  - Performed by patient: Thread/unthread right underwear leg, Thread/unthread left underwear leg Underwear - Performed by helper: Pull underwear up/down Pants- Performed by patient: Pull pants up/down Pants- Performed by helper: Thread/unthread right pants leg, Thread/unthread left pants leg   Non-skid slipper socks- Performed by helper: Don/doff right sock, Don/doff left sock Socks - Performed by patient:  (able to doff not donn) Socks - Performed by helper: Don/doff right sock, Don/doff left sock Shoes - Performed by patient: Don/doff right shoe, Don/doff left shoe Shoes - Performed by helper: Don/doff right shoe, Don/doff left shoe       TED Hose - Performed by helper: Don/doff right TED hose, Don/doff left TED hose  Lower body assist Assist for lower body dressing:  (4/8, 50%, moderate assist)      Toileting Toileting  Toileting steps completed by patient: Adjust clothing prior to toileting, Performs perineal hygiene, Adjust clothing after toileting Toileting steps completed by helper: Adjust clothing after toileting Toileting  Assistive Devices: Grab bar or rail  Toileting assist Assist level: Touching or steadying assistance (Pt.75%)   Transfers Chair/bed transfer   Chair/bed transfer method: Stand pivot Chair/bed transfer assist level: Touching or steadying assistance (Pt > 75%) Chair/bed transfer assistive device: Armrests, Medical sales representative     Max distance: 90 Assist level: Touching or steadying assistance (Pt > 75%)   Wheelchair   Type: Manual Max wheelchair distance: 150 Assist Level: Supervision or verbal cues  Cognition Comprehension Comprehension assist level: Understands basic 75 - 89% of the time/ requires cueing 10 - 24% of the time  Expression Expression assist level: Expresses basic 90% of the time/requires cueing < 10% of the time.  Social Interaction Social Interaction assist level: Interacts appropriately with others with medication or extra time (anti-anxiety, antidepressant).  Problem Solving Problem solving assist level: Solves basic 75 - 89% of the time/requires cueing 10 - 24% of the time  Memory Memory assist level: Recognizes or recalls 75 - 89% of the time/requires cueing 10 - 24% of the time   Medical Problem List and Plan: 1.  Debilitation secondary to C5 fracture after fall as well as history of Parkinson's disease. Cervical collar at all times             -continue PT, OT  -improved activity tolerance 2.  DVT Prophylaxis/Anticoagulation: Chronic Coumadin for history of pulmonary emboli/hypercoagulable state. Monitor for any bleeding episodes  INR therapeutic 3. Pain Management: Oxycodone as needed  -robaxin PRN  -utilize ice/heat also  -added low dose fentanyl patch 12.5 mcg 4. Mood: Cymbalta 60 mg daily  -appreciate neuropsych input  Pt feels that any depression in her mood has been related to increased pain. Feels better emotionally with better pain control. 5. Neuropsych: This patient is capable of making decisions on her own behalf. 6. Skin/Wound  Care: Routine skin checks 7. Fluids/Electrolytes/Nutrition:    8. Parkinson disease. Sinemet 25-100 3 times a day. Followed by Dr. [name]  9. Hypertension. HCTZ 12.5 mg daily, Diovan 160 mg daily. Monitor with increased mobility 10. Hypothyroidism. Synthroid. TSH 0.61 11. Hyperlipidemia. Lipitor, Lovaza 12. Hypokalemia  Supplemented  Labs ordered for Monday   LOS (Days) 6 A FACE TO FACE EVALUATION WAS PERFORMED  [name] [name] Lorie Phenix 12/26/2015 10:46 AM

## 2015-12-27 ENCOUNTER — Inpatient Hospital Stay (HOSPITAL_COMMUNITY): Payer: Medicare Other | Admitting: Occupational Therapy

## 2015-12-27 ENCOUNTER — Inpatient Hospital Stay (HOSPITAL_COMMUNITY): Payer: Medicare Other | Admitting: Physical Therapy

## 2015-12-27 ENCOUNTER — Inpatient Hospital Stay (HOSPITAL_COMMUNITY): Payer: Medicare Other | Admitting: Speech Pathology

## 2015-12-27 LAB — BASIC METABOLIC PANEL
Anion gap: 6 (ref 5–15)
BUN: 13 mg/dL (ref 6–20)
CHLORIDE: 103 mmol/L (ref 101–111)
CO2: 29 mmol/L (ref 22–32)
CREATININE: 0.71 mg/dL (ref 0.44–1.00)
Calcium: 9.2 mg/dL (ref 8.9–10.3)
GFR calc Af Amer: >60 mL/min (ref >60)
GFR calc non Af Amer: >60 mL/min (ref >60)
GLUCOSE: 130 mg/dL — AB (ref 65–99)
Potassium: 4.2 mmol/L (ref 3.5–5.1)
SODIUM: 138 mmol/L (ref 135–145)

## 2015-12-27 LAB — URINALYSIS, ROUTINE W REFLEX MICROSCOPIC
BILIRUBIN URINE: NEGATIVE
Glucose, UA: NEGATIVE mg/dL
HGB URINE DIPSTICK: NEGATIVE
Ketones, ur: NEGATIVE mg/dL
Nitrite: NEGATIVE
PH: 7.5 (ref 5.0–8.0)
Protein, ur: NEGATIVE mg/dL
SPECIFIC GRAVITY, URINE: 1.023 (ref 1.005–1.030)

## 2015-12-27 LAB — CBC
HCT: 40 % (ref 36.0–46.0)
Hemoglobin: 12.9 g/dL (ref 12.0–15.0)
MCH: 28.9 pg (ref 26.0–34.0)
MCHC: 32.3 g/dL (ref 30.0–36.0)
MCV: 89.7 fL (ref 78.0–100.0)
PLATELETS: 388 10*3/uL (ref 150–400)
RBC: 4.46 MIL/uL (ref 3.87–5.11)
RDW: 13.5 % (ref 11.5–15.5)
WBC: 9.1 10*3/uL (ref 4.0–10.5)

## 2015-12-27 LAB — URINE MICROSCOPIC-ADD ON

## 2015-12-27 LAB — PROTIME-INR
INR: 2.27
Prothrombin Time: 25.5 seconds — ABNORMAL HIGH (ref 11.4–15.2)

## 2015-12-27 MED ORDER — BISACODYL 10 MG RE SUPP
10.0000 mg | Freq: Every day | RECTAL | Status: DC | PRN
  Administered 2015-12-27 – 2015-12-31 (×2): 10 mg via RECTAL
  Filled 2015-12-27 (×2): qty 1

## 2015-12-27 MED ORDER — SENNOSIDES-DOCUSATE SODIUM 8.6-50 MG PO TABS
2.0000 | ORAL_TABLET | Freq: Two times a day (BID) | ORAL | Status: DC
  Administered 2015-12-27 – 2016-01-01 (×11): 2 via ORAL
  Filled 2015-12-27 (×12): qty 2

## 2015-12-27 MED ORDER — SENNOSIDES-DOCUSATE SODIUM 8.6-50 MG PO TABS: 2.0000 | ORAL_TABLET | Freq: Every day | ORAL | Status: DC

## 2015-12-27 NOTE — Progress Notes (Signed)
Physical Therapy Session Note  Patient Details  Name: [name] [name] MRN: [id number] Date of Birth: 1946/03/02  Today's Date: 12/27/2015 PT Individual Time: 1300-1400 PT Individual Time Calculation (min): 60 min    Short Term Goals: Week 1:  PT Short Term Goal 1 (Week 1): Pt will perform bed mobility on flat bed with S PT Short Term Goal 2 (Week 1): Pt will perform stand pivot transfers with consistent min guard PT Short Term Goal 3 (Week 1): Pt will perform gait x50' with RW and min guard PT Short Term Goal 4 (Week 1): Pt will initiate stair training  Skilled Therapeutic Interventions/Progress Updates:   Pt received seated in recliner working on schedule task; pt has difficulty recalling dates of admission and transfer to rehab, and is frustrated she can't remember what activities she did with this therapist three days ago. Pt easily directed and agreeable to treatment. C/o pain as below and requires cues throughout session to keep collar on; reports she should have a different brace and OT [name] is in charge of getting it for her. Gait to gym with RW x180' with close S and mod cues for navigation to locate gym. Stairs 1x12 on 6" stairs with B handrails and close S. Standing gastroc stretch on 3" stair with B handrails; performed 6x10 sec with heel raise in between due to pt difficulty with maintaining stretch >10 seconds likely due to attention impairments. Standing balance on red wedge, progressed to blue wedge for continued gastroc stretch, as well as closed chain dorsiflexion. Performed static stance on red and blue foam wedge 5x10 sec. Pt perseverative on neck brace, repeatedly telling therapist throughout session "This is not my brace"; explained to pt that original neck brace had broken and was replaced with a different collar which fit differently and is a pediatric collar however is appropriate for pt due to short neck length. Alerted RN and washed current neck brace front pad, with pt  keeping a washcloth under chin to prevent skin irritation while neck pad is drying. Returned to room with gait x180' with RW and S. Remained supine in bed at end of session, alarm intact and all needs in reach.   Therapy Documentation Precautions:  Precautions Precautions: Fall, Cervical Required Braces or Orthoses: Cervical Brace Cervical Brace: Hard collar, At all times Restrictions Weight Bearing Restrictions: No Pain: Pain Assessment Pain Assessment: 0-10 Pain Score: 5  Pain Type: Acute pain Pain Location: Neck Pain Descriptors / Indicators: Aching Pain Onset: With Activity Patients Stated Pain Goal: 4 Pain Intervention(s): Medication (See eMAR)   See Function Navigator for Current Functional Status.   Therapy/Group: Individual Therapy  Luberta Mutter 12/27/2015, 1:49 PM

## 2015-12-27 NOTE — Progress Notes (Signed)
Occupational Therapy Session Note  Patient Details  Name: [name] [name] MRN: [id number] Date of Birth: [birth year]  Today's Date: 12/27/2015 OT Individual Time: 1000-1050 OT Individual Time Calculation (min): 50 min     Short Term Goals: Week 1:  OT Short Term Goal 1 (Week 1): Pt will perform toileting with mod A sit to stand OT Short Term Goal 2 (Week 1): Pt will perform bed mobility with supervision without bed rails OT Short Term Goal 3 (Week 1): Pt will demonstrate intellecual awareness with min questioning cuing  OT Short Term Goal 4 (Week 1): Pt will demonstrate selective attention during functional tasks  Skilled Therapeutic Interventions/Progress Updates:   Patient was sitting in w/c working on scheduling activity given out in her previous speech therapy session.  She was offered skilled toileting, bathing and dressing therapy and participated as follows:  Toilet transfer (HHA to ambulate from w/c at sink to bathroom 3:1)= Min A and small steps  Toileting= close S, including sit to stand from 3:1  UB bathing and dressing in w/c at sink=setup;    LB bathing and dressing sit to stand in w/c at sink =CGA as Depends tended to get 'stalled' on her tredded socks (though she did not want to doff the tredded socks first to make it easier)  Oral care in w/c at sink=set up (she was able to sustain attention to complete the complete the task)  Patient was left seated in her recliner (with towel roll at neck and pillow behind back) in care of her nurse dtr. With call bell and phone in place.   Dtr stated she would inform the nursing staff before she leaves so that safety waist belt can be engaged.  As well, [name] exhibited positive, happy affect as her grand baby entered the room - as evidenced by singing songs and clapping hands and smiling at the baby.       Therapy Documentation Precautions:  Precautions Precautions: Fall, Cervical Required Braces or Orthoses: Cervical  Brace Cervical Brace: Hard collar, At all times Restrictions Weight Bearing Restrictions: No  Pain: Pain Assessment Pain Assessment: No/denies pain Pain Score: 0-No pain See Function Navigator for Current Functional Status.   Therapy/Group: Individual Therapy  Alfredia Ferguson [facility] 12/27/2015, 12:10 PM

## 2015-12-27 NOTE — Plan of Care (Signed)
Problem: RH BOWEL ELIMINATION Goal: RH STG MANAGE BOWEL W/MEDICATION W/ASSISTANCE STG Manage Bowel with Medication with mod I Assistance.   Outcome: Not Progressing No BM in 5 days

## 2015-12-27 NOTE — Plan of Care (Signed)
Problem: RH SAFETY Goal: RH STG ADHERE TO SAFETY PRECAUTIONS W/ASSISTANCE/DEVICE STG Adhere to Safety Precautions With  Cues/Assistance/Device.   Outcome: Not Progressing Continues to get up without assistance

## 2015-12-27 NOTE — Progress Notes (Signed)
Occupational Therapy Session Note  Patient Details  Name: [name] [name] MRN: [id number] Date of Birth: [birth year]  Today's Date: 12/27/2015 OT Individual Time: [id number] Individual Time Calculation (min): 69 min     Short Term Goals:Week 1:  OT Short Term Goal 1 (Week 1): Pt will perform toileting with mod A sit to stand OT Short Term Goal 2 (Week 1): Pt will perform bed mobility with supervision without bed rails OT Short Term Goal 3 (Week 1): Pt will demonstrate intellecual awareness with min questioning cuing  OT Short Term Goal 4 (Week 1): Pt will demonstrate selective attention during functional tasks  Skilled Therapeutic Interventions/Progress Updates:    Pt seen for OT session focusing on functional mobility, ambulation, standing tolerance/ balance and cognitive goals. Pt sitting up in recliner upon arrival, voicing increased fatigue from previous session but with increased time and encouragement pt willing. Pt voiced increased pain in neck, RN aware and medication administered.  Upon standing at recliner and adjusting pants, pt with posterior LOB, requiring mod A to regain balance. She ambulated to chairs at elevator, with min A, requiring max VCs for elongating step length. She self propelled w/c remainder of way to ADL apartment for UE strengthening. Completed dynamic standing task at kitchen cabinet, reaching overhead to remove/ replace items. VCs/ instruction provided by therapist for incorporating sorting/ organization into task, however, pt becoming frustrated with therapist and ignored cognitive aspect of task. Pt requesting return to "home" throughout, however, able to be re-directed. She completed cognitive task from table top level required to find scores of sports games in newspaper and calculate difference of scores. She required mod cues for recalling of question, able to compete simple math mod I. She returned to room, ambulated to recliner, left sitting in recliner with  all needs in reach and family member present.    Therapy Documentation Precautions:  Precautions Precautions: Fall, Cervical Required Braces or Orthoses: Cervical Brace Cervical Brace: Hard collar, At all times Restrictions Weight Bearing Restrictions: No Pain: Pain Assessment Pain Assessment: 0-10 Pain Score: 3  Pain Type: Acute pain Pain Location: Neck Pain Descriptors / Indicators: Aching Pain Onset: Gradual Pain Intervention(s): Medication (See eMAR), RN aware, repositioned, increased activity.   See Function Navigator for Current Functional Status.   Therapy/Group: Individual Therapy  [name], [name] Caradine C 12/27/2015, 7:16 AM

## 2015-12-27 NOTE — Plan of Care (Signed)
Problem: RH BOWEL ELIMINATION Goal: RH STG MANAGE BOWEL WITH ASSISTANCE STG Manage Bowel with mod  Assistance.   Outcome: Not Progressing Required sorbitol for BM this am

## 2015-12-27 NOTE — Progress Notes (Signed)
[place] PHYSICAL MEDICINE & REHABILITATION     PROGRESS NOTE    Subjective/Complaints:   Pt wanted to see fracture of spine we looked at CT results Son in law notices mental status issues, unusual comments SLP notes memory issues ROS: Denies CP, SOB, N/V/D.  Objective: Vital Signs: Blood pressure (!) 138/53, pulse 67, temperature 98.1 F (36.7 C), temperature source Oral, resp. rate 16, height 5\' 6"  (1.676 m), weight 88.1 kg (194 lb 4.8 oz), SpO2 97 %. No results found.  Recent Labs  12/27/15 0509  WBC 9.1  HGB 12.9  HCT 40.0  PLT 388    Recent Labs  12/27/15 0509  NA 138  K 4.2  CL 103  GLUCOSE 130*  BUN 13  CREATININE 0.71  CALCIUM 9.2   CBG (last 3)  No results for input(s): GLUCAP in the last 72 hours.  Wt Readings from Last 3 Encounters:  12/22/15 88.1 kg (194 lb 4.8 oz)  12/17/15 88.5 kg (195 lb)  11/24/15 89.4 kg (197 lb 3.2 oz)    Physical Exam:  Gen: Well developed. NAD.  HENT: Normocephalic. Atraumatic. Eyes: EOMare normal. No discharge.  Neck: C-collar adjusted. Cardiovascular: Normal rateand regular rhythm.  Respiratory: Effort normaland breath sounds normal. No respiratory distress.  GI: Soft. Bowel sounds are normal. She exhibits no distension.  Neurological: Alert and oriented. Motor: Grossly 4+/5 throughout Skin: Skin is warmand dry.  Psychiatric: Normal mood and affect.  Assessment/Plan: 1. Gait and functional deficits secondary to C5 Fx (hx of parkinson's disease) which require 3+ hours per day of interdisciplinary therapy in a comprehensive inpatient rehab setting. Physiatrist is providing close team supervision and 24 hour management of active medical problems listed below. Physiatrist and rehab team continue to assess barriers to discharge/monitor patient progress toward functional and medical goals.  Function:  Bathing Bathing position   Position: Wheelchair/chair at sink  Bathing parts Body parts bathed by patient:  Right arm, Left arm, Chest, Abdomen, Front perineal area, Buttocks, Right upper leg, Left upper leg Body parts bathed by helper: Right lower leg, Left lower leg, Back  Bathing assist Assist Level: Touching or steadying assistance(Pt > 75%)      Upper Body Dressing/Undressing Upper body dressing   What is the patient wearing?: Pull over shirt/dress     Pull over shirt/dress - Perfomed by patient: Thread/unthread right sleeve, Thread/unthread left sleeve, Put head through opening Pull over shirt/dress - Perfomed by helper: Pull shirt over trunk Button up shirt - Perfomed by patient: Thread/unthread right sleeve, Thread/unthread left sleeve, Button/unbutton shirt Button up shirt - Perfomed by helper: Pull shirt around back    Upper body assist Assist Level: Touching or steadying assistance(Pt > 75%)      Lower Body Dressing/Undressing Lower body dressing   What is the patient wearing?: Non-skid slipper socks, Underwear, Pants, [employer]  - Performed by patient: Thread/unthread right underwear leg, Thread/unthread left underwear leg Underwear - Performed by helper: Pull underwear up/down Pants- Performed by patient: Pull pants up/down Pants- Performed by helper: Thread/unthread right pants leg, Thread/unthread left pants leg   Non-skid slipper socks- Performed by helper: Don/doff right sock, Don/doff left sock Socks - Performed by patient:  (able to doff not donn) Socks - Performed by helper: Don/doff right sock, Don/doff left sock Shoes - Performed by patient: Don/doff right shoe, Don/doff left shoe Shoes - Performed by helper: Don/doff right shoe, Don/doff left shoe       TED Hose - Performed by helper: Don/doff  right TED hose, Don/doff left TED hose  Lower body assist Assist for lower body dressing:  (4/8, 50%, moderate assist)      Toileting Toileting   Toileting steps completed by patient: Adjust clothing prior to toileting, Performs perineal hygiene, Adjust clothing  after toileting Toileting steps completed by helper: Adjust clothing after toileting Toileting Assistive Devices: Grab bar or rail  Toileting assist Assist level: Touching or steadying assistance (Pt.75%)   Transfers Chair/bed transfer   Chair/bed transfer method: Stand pivot Chair/bed transfer assist level: Touching or steadying assistance (Pt > 75%) Chair/bed transfer assistive device: Armrests, Medical sales representative     Max distance: 90 Assist level: Touching or steadying assistance (Pt > 75%)   Wheelchair   Type: Manual Max wheelchair distance: 150 Assist Level: Supervision or verbal cues  Cognition Comprehension Comprehension assist level: Understands basic 75 - 89% of the time/ requires cueing 10 - 24% of the time  Expression Expression assist level: Expresses basic 90% of the time/requires cueing < 10% of the time.  Social Interaction Social Interaction assist level: Interacts appropriately with others with medication or extra time (anti-anxiety, antidepressant).  Problem Solving Problem solving assist level: Solves basic 75 - 89% of the time/requires cueing 10 - 24% of the time  Memory Memory assist level: Recognizes or recalls 75 - 89% of the time/requires cueing 10 - 24% of the time   Medical Problem List and Plan: 1.  Debilitation secondary to C5 fracture after fall as well as history of Parkinson's disease. Cervical collar at all times             -continue PT, OT, SLP  -improved activity tolerance 2.  DVT Prophylaxis/Anticoagulation: Chronic Coumadin for history of pulmonary emboli/hypercoagulable state. Monitor for any bleeding episodes  INR therapeutic 3. Pain Management: Oxycodone as needed, pt smiling but says she is in pain, hx fibromyalgia  -robaxin PRN  -utilize ice/heat also  -added low dose fentanyl patch 12.5 mcg but this may be contributing to MS changes,may need to d/c 4. Mood: Cymbalta 60 mg daily  -appreciate neuropsych input  Pt feels  that any depression in her mood has been related to increased pain. Feels better emotionally with better pain control. 5. Neuropsych: This patient is capable of making decisions on her own behalf. 6. Skin/Wound Care: Routine skin checks 7. Fluids/Electrolytes/Nutrition:    8. Parkinson disease. Sinemet 25-100 3 times a day. Followed by Dr. [name]  9. Hypertension. HCTZ 12.5 mg daily, Diovan 160 mg daily. Monitor with increased mobility 10. Hypothyroidism. Synthroid. TSH 0.61 11. Hyperlipidemia. Lipitor, Lovaza 12. Hypokalemia  Supplemented  Labs reviewed , normal 13.  MS changes will ask for UA C and S, consider pain meds  LOS (Days) 7 A FACE TO FACE EVALUATION WAS PERFORMED  [name] [name] E 12/27/2015 9:15 AM

## 2015-12-27 NOTE — Plan of Care (Signed)
Problem: RH BOWEL ELIMINATION Goal: RH STG MANAGE BOWEL WITH ASSISTANCE STG Manage Bowel with mod  Assistance.   Outcome: Not Progressing No BM in days.

## 2015-12-27 NOTE — Progress Notes (Signed)
ANTICOAGULATION CONSULT NOTE - Follow Up Consult  Pharmacy Consult for coumadin Indication: hypercoagulable state  Allergies  Allergen Reactions  . Crestor [Rosuvastatin Calcium] Other (See Comments)    Feeling very bad, aching all over.  . Erythromycin Hives  . Gabapentin Other (See Comments)    Blurred vision  . Pregabalin Other (See Comments)    Crossed vision.  . Carbidopa Other (See Comments)    Headaches, nausea, dizziness  . Macrobid [Nitrofurantoin] Hives  . Losartan Other (See Comments)    Patient Measurements: Height: 5\' 6"  (167.6 cm) Weight: 194 lb 4.8 oz (88.1 kg) IBW/kg (Calculated) : 59.3 Heparin Dosing Weight:   Vital Signs: Temp: 98.1 F (36.7 C) (10/23 0459) Temp Source: Oral (10/23 0459) BP: 138/53 (10/23 0459) Pulse Rate: 67 (10/23 0459)  Labs:  Recent Labs  12/25/15 0957 12/26/15 0455 12/27/15 0509  HGB  --   --  12.9  HCT  --   --  40.0  PLT  --   --  388  LABPROT 27.7* 26.7* 25.5*  INR 2.52 2.42 2.27  CREATININE  --   --  0.71    Estimated Creatinine Clearance: 71 mL/min (by C-G formula based on SCr of 0.71 mg/dL).   Medications:  Scheduled:  . aspirin EC  81 mg Oral QPC breakfast  . atorvastatin  10 mg Oral Daily  . azelastine  1 spray Each Nare BID  . Carbidopa-Levodopa ER  1 tablet Oral TID  . celecoxib  100 mg Oral BID  . cycloSPORINE  1 drop Both Eyes BID  . DULoxetine  60 mg Oral Daily  . fentaNYL  12.5 mcg Transdermal Q72H  . guaiFENesin  600 mg Oral BID  . valsartan  160 mg Oral Daily   And  . hydrochlorothiazide  12.5 mg Oral Daily  . levothyroxine  150 mcg Oral QAC breakfast  . magnesium oxide  200 mg Oral Daily  . multivitamin with minerals  1 tablet Oral Daily  . omega-3 acid ethyl esters  1 g Oral Daily  . oxyCODONE-acetaminophen  1 tablet Oral BID  . potassium chloride  20 mEq Oral Daily  . senna  1 tablet Oral Daily  . senna-docusate  2 tablet Oral QHS  . warfarin  3 mg Oral q1800  . Warfarin - Pharmacist  Dosing Inpatient   Does not apply q1800  . zolpidem  5 mg Oral QHS   Infusions:    Assessment: 70 yo female with hypercoagulable state is currently on therapeutic coumadin.  INR today is 2.27.    Goal of Therapy:  INR 2-3 Monitor platelets by anticoagulation protocol: Yes   Plan:  - continue coumadin 3mg  po daily - INR MWF  Shela Esses, [facility] 12/27/2015,8:49 AM

## 2015-12-27 NOTE — Progress Notes (Signed)
Speech Language Pathology Daily Session Note  Patient Details  Name: [name] [name] MRN: [id number] Date of Birth: 05-12-1945  Today's Date: 12/27/2015 SLP Individual Time: 0900-1000 SLP Individual Time Calculation (min): 60 min   Short Term Goals: Week 1: SLP Short Term Goal 1 (Week 1): Patient will demonstrate selective attention to functional task for 15 minutes given Min A verbal cues. SLP Short Term Goal 2 (Week 1): Patient will demonstrate mildly complex problem solving with functional tasks given Min A verbal/question cues.  SLP Short Term Goal 3 (Week 1): Patient will demonstrate intellectual awareness of 2 cognitive deficits given Min A question cues.  SLP Short Term Goal 4 (Week 1): Patient will demonstrate recall of new information given Min A verbal cues and the use external aids with 90% accuracy. SLP Short Term Goal 5 (Week 1): Patient will demonstrate emergent awareness of decreased frustration tolerance and request rest breaks when needed with Min A question and verbal cues.   Skilled Therapeutic Interventions: Skilled treatment session focused on cognitive goals. SLP facilitated session by providing Mod-Max A verbal, visual and question cues for recall of events from previous therapy sessions. Patient independently recalled she did not utilize her memory notebook to record events over the weekend and demonstrated emergent awareness of "dissapointment" of clinician and verbalized she knows she could be "working harder" than she is. She verbalized if someone showed her the imaging of her fracture, she might take therapy "more seriously." Physician aware and showed/explained fracture to patient. Patient required Mod-Max A question, verbal and visual cues with extra time for problem solving, recall and organization during a mildly complex scheduling task. Patient left upright in wheelchair with all needs within reach and quick release belt in place. Continue with current plan of care.    Function:  Cognition Comprehension Comprehension assist level: Understands basic 75 - 89% of the time/ requires cueing 10 - 24% of the time  Expression   Expression assist level: Expresses basic 90% of the time/requires cueing < 10% of the time.  Social Interaction Social Interaction assist level: Interacts appropriately with others with medication or extra time (anti-anxiety, antidepressant).  Problem Solving Problem solving assist level: Solves basic 75 - 89% of the time/requires cueing 10 - 24% of the time  Memory Memory assist level: Recognizes or recalls 50 - 74% of the time/requires cueing 25 - 49% of the time    Pain Pain Assessment Pain Assessment: No/denies pain Pain Score: 0-No pain  Therapy/Group: Individual Therapy  [name] [name] 12/27/2015, 11:21 AM

## 2015-12-28 ENCOUNTER — Inpatient Hospital Stay (HOSPITAL_COMMUNITY): Payer: Medicare Other | Admitting: Occupational Therapy

## 2015-12-28 ENCOUNTER — Inpatient Hospital Stay (HOSPITAL_COMMUNITY): Payer: Medicare Other | Admitting: Physical Therapy

## 2015-12-28 ENCOUNTER — Inpatient Hospital Stay (HOSPITAL_COMMUNITY): Payer: Medicare Other | Admitting: Speech Pathology

## 2015-12-28 LAB — URINALYSIS, ROUTINE W REFLEX MICROSCOPIC
Bilirubin Urine: NEGATIVE
Glucose, UA: NEGATIVE mg/dL
Hgb urine dipstick: NEGATIVE
KETONES UR: NEGATIVE mg/dL
NITRITE: NEGATIVE
PH: 7.5 (ref 5.0–8.0)
Protein, ur: NEGATIVE mg/dL
SPECIFIC GRAVITY, URINE: 1.02 (ref 1.005–1.030)

## 2015-12-28 LAB — URINE MICROSCOPIC-ADD ON

## 2015-12-28 LAB — URINE CULTURE

## 2015-12-28 NOTE — Progress Notes (Signed)
Physical Therapy Weekly Progress Note  Patient Details  Name: [name] [name] MRN: [id number] Date of Birth: 10/17/1945  Beginning of progress report period: December 21, 2015 End of progress report period: December 28, 2015  Today's Date: 12/28/2015 PT Individual Time: 1430-1530 PT Individual Time Calculation (min): 60 min    Patient has met 4 of 4 short term goals.  Currently requires close S for dynamic standing balance, transfers, gait and stairs with RW and UE support on stairs. Pt limited in participation during sessions due to pain, cognitive deficits, impaired attention, memory and recall.   Patient continues to demonstrate the following deficits: impaired activity tolerance, balance, postural control, ability to compensate for deficits, functional use of  right lower extremity and left lower extremity, attention, awareness and knowledge of precautions and therefore will continue to benefit from skilled PT intervention to enhance overall performance with bed mobility, transfers, gait, stairs, home and community access.  Patient progressing toward long term goals..  Continue plan of care.  PT Short Term Goals Week 1:  PT Short Term Goal 1 (Week 1): Pt will perform bed mobility on flat bed with S PT Short Term Goal 1 - Progress (Week 1): Met PT Short Term Goal 2 (Week 1): Pt will perform stand pivot transfers with consistent min guard PT Short Term Goal 2 - Progress (Week 1): Met PT Short Term Goal 3 (Week 1): Pt will perform gait x50' with RW and min guard PT Short Term Goal 3 - Progress (Week 1): Met PT Short Term Goal 4 (Week 1): Pt will initiate stair training PT Short Term Goal 4 - Progress (Week 1): Met Week 2:  PT Short Term Goal 1 (Week 2): =LTG due to estimated LOS   Skilled Therapeutic Interventions/Progress Updates:   Pt received seated in day room with handoff from previous session; c/o pain 8/10 in neck and agreeable to treatment. Pt completed fine motor task of painting  pumpkins; required mod verbal cues to maintain attention to task with pt frequently distracted by surroundings. Gait 2x100' with RW and close S; encouragement required to continue participation and therapist attempted to set reasonable goals for pt to visualize end target. Returned to room and pt returned to supine d/t pain. After several minute rest break, pt agreeable to attempt LE exercises. Stand pivot transfer bed>w/c to perform sit <>stand x10 from w/c with light weighted ball. Pt continues to c/o pain and report therapist is "pushing me to my limit". Attempted to educate pt on goals of rehab, expectation that activities need to be progressed to continue challenging pt and prepare for d/c home. Pt reports she is trying as hard as she can, and therapists need to be more understanding of her pain, and challenge her but not to the limit. Validated pt's pain and feelings and offered emotional support. Pt returned to bed at end of session, remained supine with alarm intact and all needs in reach.   Therapy Documentation Precautions:  Precautions Precautions: Fall, Cervical Required Braces or Orthoses: Cervical Brace Cervical Brace: Hard collar, At all times Restrictions Weight Bearing Restrictions: No Pain: Pain Assessment Pain Score: 3    See Function Navigator for Current Functional Status.  Therapy/Group: Individual Therapy  Luberta Mutter 12/28/2015, 4:00 PM

## 2015-12-28 NOTE — Progress Notes (Signed)
Speech Language Pathology Weekly Progress Note  Patient Details  Name: [name] [name] MRN: [id number] Date of Birth: Feb 04, 1946  Beginning of progress report period: December 21, 2015 End of progress report period: December 28, 2015  Short Term Goals: Week 1: SLP Short Term Goal 1 (Week 1): Patient will demonstrate selective attention to functional task for 15 minutes given Min A verbal cues. SLP Short Term Goal 1 - Progress (Week 1): Met SLP Short Term Goal 2 (Week 1): Patient will demonstrate mildly complex problem solving with functional tasks given Min A verbal/question cues.  SLP Short Term Goal 2 - Progress (Week 1): Not met SLP Short Term Goal 3 (Week 1): Patient will demonstrate intellectual awareness of 2 cognitive deficits given Min A question cues.  SLP Short Term Goal 3 - Progress (Week 1): Met SLP Short Term Goal 4 (Week 1): Patient will demonstrate recall of new information given Min A verbal cues and the use external aids with 90% accuracy. SLP Short Term Goal 4 - Progress (Week 1): Not met SLP Short Term Goal 5 (Week 1): Patient will demonstrate emergent awareness of decreased frustration tolerance and request rest breaks when needed with Min A question and verbal cues.  SLP Short Term Goal 5 - Progress (Week 1): Not met    New Short Term Goals: Week 2: SLP Short Term Goal 1 (Week 2): STG's=LTGs  Weekly Progress Updates: Patient has made minimal gains and has met 2 of 5 STG's this reporting period due to improved attention and intellectual awareness of cognitive deficits. Currently, patient requires overall Mod A multimodal cues for problem solving with mildly complex tasks, recall with use of external aids and emergent awareness of decreased frustration tolerance. Patient also requires overall Min A for selective attention in a mildly distracting environment and intellectual awareness of cognitive deficits. Patient and family education is ongoing. Patient would benefit from  continued skilled SLP intervention to maximize cognitive function and overall functional independence prior to discharge.     Intensity: Minumum of 1-2 x/day, 30 to 90 minutes Frequency: 3 to 5 out of 7 days Duration/Length of Stay: 01/01/16 Treatment/Interventions: Cognitive remediation/compensation;Cueing hierarchy;Functional tasks;Internal/external aids;Therapeutic Activities;Patient/family education   12/28/2015, 10:05 AM  Weston Anna, [place], [place]

## 2015-12-28 NOTE — Progress Notes (Signed)
Speech Language Pathology Daily Session Note  Patient Details  Name: [name] [name] MRN: [id number] Date of Birth: November 27, 1945  Today's Date: 12/28/2015 SLP Individual Time: 1100-1130 SLP Individual Time Calculation (min): 30 min   Short Term Goals: Week 2: SLP Short Term Goal 1 (Week 2): STG's=LTGs  Skilled Therapeutic Interventions:   Skilled treatment session focused on addressing cognition goals. SLP facilitated session by providing Max assist verbal, question cues to locate place in previously started schedule task.  During completion of the mildly complex scheduling task, patient required Mod assist verbal cues for redirection to task in a mildly distracting environment as well as Min assist verbal cues for problem solving to accurately complete task.  Continue with current plan of care.    Function:  Eating Eating   Modified Consistency Diet: No Eating Assist Level: Set up assist for   Eating Set Up Assist For: Opening containers       Cognition Comprehension Comprehension assist level: Understands basic 75 - 89% of the time/ requires cueing 10 - 24% of the time  Expression   Expression assist level: Expresses basic 90% of the time/requires cueing < 10% of the time.  Social Interaction Social Interaction assist level: Interacts appropriately 90% of the time - Needs monitoring or encouragement for participation or interaction.  Problem Solving Problem solving assist level: Solves basic 50 - 74% of the time/requires cueing 25 - 49% of the time  Memory Memory assist level: Recognizes or recalls 50 - 74% of the time/requires cueing 25 - 49% of the time    Pain Pain Assessment Pain Assessment: No/denies pain  Therapy/Group: Individual Therapy  [name]., [place] [id number]  [place] 12/28/2015, 5:08 PM

## 2015-12-28 NOTE — Progress Notes (Signed)
Occupational Therapy Session Note  Patient Details  Name: [name] [name] MRN: [id number] Date of Birth: [birth year]  Today's Date: 12/28/2015 OT Individual Time: [id number] Individual Time Calculation (min): 30 min     Short Term Goals:Week 1:  OT Short Term Goal 1 (Week 1): Pt will perform toileting with mod A sit to stand OT Short Term Goal 2 (Week 1): Pt will perform bed mobility with supervision without bed rails OT Short Term Goal 3 (Week 1): Pt will demonstrate intellecual awareness with min questioning cuing  OT Short Term Goal 4 (Week 1): Pt will demonstrate selective attention during functional tasks  Skilled Therapeutic Interventions/Progress Updates:    Pt seen for OT session focusing on ADL re-training and cognitive goals. Pt in supine upon arrival finishing with SLP and agreeable to tx session with encouragement. Pt's daughter present during session.  Pt instructed to ambulate with RW to gather clothing items in prep for changing clothes. She required assist for shifting hops to EOB and overall min steadying assist with occasional mod A due to posterior LOB during ambulation. She required mod VCs to attend to task. Pt picked out 3 shirts and no pants, able to identify mistake, however, when given opportunity could not correct.  She dressed seated EOB with significantly increased time and steadying assist when standing to pull pants up. She required rest breaks throughout session, twice returning to supine to rest. She declined sitting up in w/c at end of session in prep for lunch, opting to return to supine to rest. Left in bed with all needs in reach, daughter present.   Therapy Documentation Precautions:  Precautions Precautions: Fall, Cervical Required Braces or Orthoses: Cervical Brace Cervical Brace: Hard collar, At all times Restrictions Weight Bearing Restrictions: No Pain: Pain Assessment Pain Assessment: 0-10 Pain Score: 10-Worst pain ever Pain Type: Acute  pain Pain Location: Neck Pain Orientation: Posterior Pain Descriptors / Indicators: Aching Pain Onset: With Activity Patients Stated Pain Goal: 2 Pain Intervention(s): Medication (See eMAR), RN aware, repositioned, distraction, rest  See Function Navigator for Current Functional Status.   Therapy/Group: Individual Therapy  Lewis, Shalondra Wunschel C 12/28/2015, 12:14 PM

## 2015-12-28 NOTE — Progress Notes (Signed)
Speech Language Pathology Daily Session Note  Patient Details  Name: [name] [name] MRN: [id number] Date of Birth: [birth year]  Today's Date: 12/28/2015 SLP Individual Time: 0930-1010 SLP Individual Time Calculation (min): 40 min   Short Term Goals: Week 2: SLP Short Term Goal 1 (Week 2): STG's=LTGs  Skilled Therapeutic Interventions: Skilled therapy intervention focused on cognitive goals. Patient perserverative on procedural details of memory activity, requiring Mod A verbal cues for redirection. Patient needed Mod A verbal and visual cues to retrieve the names of her therapists and utilize her therapy schedule as an external aid. Given a mildly complex scheduling task, patient required Mod A verbal cues for sustained attention and Min A verbal cues for problem solving. Scheduling task to continue at next session. Patient left upright in bed with all needs within reach. Continue current plan of care.    Function:  Cognition Comprehension Comprehension assist level: Understands basic 75 - 89% of the time/ requires cueing 10 - 24% of the time  Expression   Expression assist level: Expresses basic 90% of the time/requires cueing < 10% of the time.  Social Interaction Social Interaction assist level: Interacts appropriately 90% of the time - Needs monitoring or encouragement for participation or interaction.  Problem Solving Problem solving assist level: Solves basic 50 - 74% of the time/requires cueing 25 - 49% of the time  Memory Memory assist level: Recognizes or recalls 50 - 74% of the time/requires cueing 25 - 49% of the time    Pain Pain Assessment Pain Assessment: No/denies pain  Therapy/Group: Individual Therapy  Thornton Papas 12/28/2015, 10:46 AM

## 2015-12-28 NOTE — Progress Notes (Signed)
Occupational Therapy Session Note  Patient Details  Name: [name] [name] MRN: [id number] Date of Birth: 09/29/1945  Today's Date: 12/28/2015 OT Individual Time: 1330-1430 OT Individual Time Calculation (min): 60 min     Short Term Goals: Week 1:  OT Short Term Goal 1 (Week 1): Pt will perform toileting with mod A sit to stand OT Short Term Goal 2 (Week 1): Pt will perform bed mobility with supervision without bed rails OT Short Term Goal 3 (Week 1): Pt will demonstrate intellecual awareness with min questioning cuing  OT Short Term Goal 4 (Week 1): Pt will demonstrate selective attention during functional tasks  Skilled Therapeutic Interventions/Progress Updates:   1:1 Therapeutic activities at the Patient [employer] with focus on sit to stands, stnading balance and functional ambulation with RW during game of cornhole with steadying A to occasional supervision. Pt with difficulty with keeping score of points requiring max A for simple math and functional problem solving. During a moderate distracting environment, also participated in word puzzle with mod cues to remain on task in seated position to address cognitive goals.   Therapy Documentation Precautions:  Precautions Precautions: Fall, Cervical Required Braces or Orthoses: Cervical Brace Cervical Brace: Hard collar, At all times Restrictions Weight Bearing Restrictions: No General:   Vital Signs: Therapy Vitals Temp: 98.6 F (37 C) Temp Source: Oral Pulse Rate: 97 Resp: 18 BP: (!) 104/50 Patient Position (if appropriate): Lying Oxygen Therapy SpO2: 95 % O2 Device: Not Delivered Pain: Pain Assessment Pain Assessment: 0-10 Pain Score: 3  Pain Type: Acute pain Pain Location: Neck Pain Intervention(s): Medication (See eMAR)  See Function Navigator for Current Functional Status.   Therapy/Group: Individual Therapy  Willeen Cass [facility] 12/28/2015, 3:41 PM

## 2015-12-29 ENCOUNTER — Inpatient Hospital Stay (HOSPITAL_COMMUNITY): Payer: Medicare Other | Admitting: Physical Therapy

## 2015-12-29 ENCOUNTER — Inpatient Hospital Stay (HOSPITAL_COMMUNITY): Payer: Medicare Other | Admitting: Speech Pathology

## 2015-12-29 ENCOUNTER — Inpatient Hospital Stay (HOSPITAL_COMMUNITY): Payer: Medicare Other | Admitting: Occupational Therapy

## 2015-12-29 DIAGNOSIS — S12401S Unspecified nondisplaced fracture of fifth cervical vertebra, sequela: Secondary | ICD-10-CM

## 2015-12-29 LAB — URINE CULTURE

## 2015-12-29 LAB — PROTIME-INR
INR: 2.63
Prothrombin Time: 28.6 seconds — ABNORMAL HIGH (ref 11.4–15.2)

## 2015-12-29 MED ORDER — CAMPHOR-MENTHOL 0.5-0.5 % EX LOTN
TOPICAL_LOTION | CUTANEOUS | Status: DC | PRN
  Administered 2015-12-31: 18:00:00 via TOPICAL
  Filled 2015-12-29: qty 222

## 2015-12-29 NOTE — Progress Notes (Signed)
ANTICOAGULATION CONSULT NOTE - Follow Up Consult  Pharmacy Consult for coumadin Indication: hypercoagulable state  Allergies  Allergen Reactions  . Crestor [Rosuvastatin Calcium] Other (See Comments)    Feeling very bad, aching all over.  . Erythromycin Hives  . Gabapentin Other (See Comments)    Blurred vision  . Pregabalin Other (See Comments)    Crossed vision.  . Carbidopa Other (See Comments)    Headaches, nausea, dizziness  . Macrobid [Nitrofurantoin] Hives  . Losartan Other (See Comments)    Patient Measurements: Height: 5\' 6"  (167.6 cm) Weight: 194 lb 9.6 oz (88.3 kg) IBW/kg (Calculated) : 59.3 Heparin Dosing Weight:   Vital Signs: Temp: 97.6 F (36.4 C) (10/25 0500) Temp Source: Oral (10/25 0500) BP: 122/59 (10/25 0500) Pulse Rate: 83 (10/25 0500)  Labs:  Recent Labs  12/27/15 0509 12/29/15 0658  HGB 12.9  --   HCT 40.0  --   PLT 388  --   LABPROT 25.5* 28.6*  INR 2.27 2.63  CREATININE 0.71  --     Estimated Creatinine Clearance: 71.1 mL/min (by C-G formula based on SCr of 0.71 mg/dL).   Assessment: 70 yo female with hypercoagulable state is currently on therapeutic coumadin.  INR today is 2.63.   Goal of Therapy:  INR 2-3 Monitor platelets by anticoagulation protocol: Yes   Plan:  - Continue coumadin 3mg  po daily - INR MWF  Manley Mason 12/29/2015,2:28 PM

## 2015-12-29 NOTE — Patient Care Conference (Signed)
Inpatient RehabilitationTeam Conference and Plan of Care Update Date: 12/28/2015   Time: 10:10 AM    Patient Name: [name] [name]      Medical Record Number: [id number]  Date of Birth: May 31, 1945 Sex: Female         Room/Bed: 4W20C/4W20C-01 Payor Info: Payor: MEDICARE / Plan: MEDICARE PART A AND B / Product Type: *No Product type* /    Admitting Diagnosis: C5 Fx  Admit Date/Time:  12/20/2015  6:22 PM Admission Comments: No comment available   Primary Diagnosis:  Displaced fracture of fifth cervical vertebra ([place]) Principal Problem: Displaced fracture of fifth cervical vertebra [facility])  Patient Active Problem List   Diagnosis Date Noted  . Benign essential HTN   . Intractable pain 12/17/2015  . Displaced fracture of fifth cervical vertebra ([place]) 12/17/2015  . Gait disturbance 12/17/2015  . Trochanteric bursitis of both hips 09/13/2015  . Chronic low back pain 06/07/2015  . Fibromyalgia syndrome 05/17/2015  . Hypothyroidism 05/17/2015  . Parkinson's disease ([place]) 05/17/2015  . Hypercoagulable state ([place]) 02/07/2012  . History of pulmonary embolism 02/07/2012  . S/P right TKA 05/22/2011    Expected Discharge Date: Expected Discharge Date: 01/01/16  Team Members Present: Physician leading conference: Dr. Delice Lesch Social Worker Present: Lennart Pall, LCSW Nurse Present: Dorien Chihuahua, RN PT Present: Kem Parkinson, PT OT Present: Napoleon Form, OT SLP Present: Weston Anna, SLP PPS Coordinator present : Daiva Nakayama, RN, CRRN     Current Status/Progress Goal Weekly Team Focus  Medical   C5 fracture after fall as well as history of Parkinson's disease.   Improve pain, cognitive function, mobility  See above   Bowel/Bladder   Continent of bowel and bladder; some urinary urgency- UA positive for bacteria, UC shows multiple species present; LBM 10/23 after sorbitol  mod I assist  Assess and treat for constipation as needed   Swallow/Nutrition/ Hydration             ADL's   Supervision- min A bathing/dressing with mod- max VCs due to impaired cognition  supervision   Cognitive re-training, ADL re-training, pain management, d/c planning, pain management, family ed   Mobility   S bed mobility and transfers, S/min guard gait with RW, S stairs with handails. Limited by cognitive impairments, poor frustration tolerance.  S overall, minA stairs  dynamic standing balance, activity tolerance, gait training   Communication             Safety/Cognition/ Behavioral Observations  Min-Mod A  Supervision-Min A  problem solving, recall with use of aids, attention and awareness    Pain   C/o pain in neck; scheduled percocet d/cd along with fentanyl patch d/t cognition changes  < 4  Assess and treat for pain q shift and prn; adjust meds and monitor for effectiveness   Skin   bruising and scratches noted to neck area from brace; rash to back- sarna lotion ordered  Mod I assist  Assess skin q shift and prn    Rehab Goals Patient on target to meet rehab goals: Yes *See Care Plan and progress notes for long and short-term goals.  Barriers to Discharge: Pain, cognitive deficits, now with UTI    Possible Resolutions to Barriers:  treat UTI, optimize pain meds/cognitive side effects, therapies    Discharge Planning/Teaching Needs:  home with 24/7 supervision from daughter and son-in-law  Teaching is ongoing.   Team Discussion:  Pain continues to be an issue as well as cognition.  UTI? Cognitive deficits since acute  and suspect post concussive.  Easily confused and disoriented but this is inconsistent.  Can get very focused on neck brace.  Supervision overall with balance strategies.  Requires cueing and coaching through activities.  Recommend HH to start.  Revisions to Treatment Plan:  None   Continued Need for Acute Rehabilitation Level of Care: The patient requires daily medical management by a physician with specialized training in physical medicine and rehabilitation for  the following conditions: Daily direction of a multidisciplinary physical rehabilitation program to ensure safe treatment while eliciting the highest outcome that is of practical value to the patient.: Yes Daily medical management of patient stability for increased activity during participation in an intensive rehabilitation regime.: Yes Daily analysis of laboratory values and/or radiology reports with any subsequent need for medication adjustment of medical intervention for : Neurological problems;Post surgical problems  Adrie Picking 12/29/2015, 3:43 PM

## 2015-12-29 NOTE — Progress Notes (Signed)
[place] PHYSICAL MEDICINE & REHABILITATION     PROGRESS NOTE    Subjective/Complaints:  Still confused, inappropriate Discussed with RN, repeat UA -, no pain c/os was started on duragesic and scheduled percocet ~5d ago ROS: Denies CP, SOB, N/V/D.  Objective: Vital Signs: Blood pressure (!) 122/59, pulse 83, temperature 97.6 F (36.4 C), temperature source Oral, resp. rate 18, height 5\' 6"  (1.676 m), weight 88.3 kg (194 lb 9.6 oz), SpO2 97 %. No results found.  Recent Labs  12/27/15 0509  WBC 9.1  HGB 12.9  HCT 40.0  PLT 388    Recent Labs  12/27/15 0509  NA 138  K 4.2  CL 103  GLUCOSE 130*  BUN 13  CREATININE 0.71  CALCIUM 9.2   CBG (last 3)  No results for input(s): GLUCAP in the last 72 hours.  Wt Readings from Last 3 Encounters:  12/29/15 88.3 kg (194 lb 9.6 oz)  12/17/15 88.5 kg (195 lb)  11/24/15 89.4 kg (197 lb 3.2 oz)    Physical Exam:  Gen: Well developed. NAD.  HENT: Normocephalic. Atraumatic. Eyes: EOMare normal. No discharge.  Neck: C-collar  Cardiovascular: Normal rateand regular rhythm.  Respiratory: Effort normaland breath sounds normal. No respiratory distress.  GI: Soft. Bowel sounds are normal. She exhibits no distension.  Neurological: Alert and oriented. Motor: Grossly 4+/5 throughout Skin: Skin is warmand dry.  Psychiatric: Normal mood and affect.  Assessment/Plan: 1. Gait and functional deficits secondary to C5 Fx (hx of parkinson's disease) which require 3+ hours per day of interdisciplinary therapy in a comprehensive inpatient rehab setting. Physiatrist is providing close team supervision and 24 hour management of active medical problems listed below. Physiatrist and rehab team continue to assess barriers to discharge/monitor patient progress toward functional and medical goals.  Function:  Bathing Bathing position   Position: Wheelchair/chair at sink  Bathing parts Body parts bathed by patient: Right arm, Left arm,  Chest, Abdomen, Front perineal area, Buttocks, Right upper leg, Left upper leg Body parts bathed by helper: Right lower leg, Left lower leg, Back  Bathing assist Assist Level: Touching or steadying assistance(Pt > 75%)      Upper Body Dressing/Undressing Upper body dressing   What is the patient wearing?: Pull over shirt/dress     Pull over shirt/dress - Perfomed by patient: Thread/unthread right sleeve, Thread/unthread left sleeve, Put head through opening, Pull shirt over trunk Pull over shirt/dress - Perfomed by helper: Pull shirt over trunk Button up shirt - Perfomed by patient: Thread/unthread right sleeve, Thread/unthread left sleeve, Button/unbutton shirt Button up shirt - Perfomed by helper: Pull shirt around back    Upper body assist Assist Level: Supervision or verbal cues      Lower Body Dressing/Undressing Lower body dressing   What is the patient wearing?: Non-skid slipper socks, Underwear, Pants Underwear - Performed by patient: Thread/unthread right underwear leg, Thread/unthread left underwear leg, Pull underwear up/down Underwear - Performed by helper: Pull underwear up/down Pants- Performed by patient: Thread/unthread right pants leg, Thread/unthread left pants leg, Pull pants up/down Pants- Performed by helper: Thread/unthread right pants leg, Thread/unthread left pants leg   Non-skid slipper socks- Performed by helper: Don/doff right sock, Don/doff left sock Socks - Performed by patient:  (able to doff not donn) Socks - Performed by helper: Don/doff right sock, Don/doff left sock Shoes - Performed by patient: Don/doff right shoe, Don/doff left shoe Shoes - Performed by helper: Don/doff right shoe, Don/doff left shoe       TED  Hose - Performed by helper: Don/doff right TED hose, Don/doff left TED hose  Lower body assist Assist for lower body dressing: Touching or steadying assistance (Pt > 75%)      Toileting Toileting   Toileting steps completed by patient:  Adjust clothing prior to toileting, Performs perineal hygiene, Adjust clothing after toileting Toileting steps completed by helper: Adjust clothing prior to toileting, Performs perineal hygiene, Adjust clothing after toileting Toileting Assistive Devices: Grab bar or rail  Toileting assist Assist level: Touching or steadying assistance (Pt.75%)   Transfers Chair/bed transfer   Chair/bed transfer method: Stand pivot Chair/bed transfer assist level: Supervision or verbal cues Chair/bed transfer assistive device: Armrests, Medical sales representative     Max distance: 100 Assist level: Supervision or verbal cues   Wheelchair   Type: Manual Max wheelchair distance: 150 Assist Level: Supervision or verbal cues  Cognition Comprehension Comprehension assist level: Understands basic 75 - 89% of the time/ requires cueing 10 - 24% of the time  Expression Expression assist level: Expresses basic 90% of the time/requires cueing < 10% of the time.  Social Interaction Social Interaction assist level: Interacts appropriately 90% of the time - Needs monitoring or encouragement for participation or interaction.  Problem Solving Problem solving assist level: Solves basic 50 - 74% of the time/requires cueing 25 - 49% of the time  Memory Memory assist level: Recognizes or recalls 50 - 74% of the time/requires cueing 25 - 49% of the time   Medical Problem List and Plan: 1.  Debilitation secondary to C5 fracture after fall as well as history of Parkinson's disease. Cervical collar at all times             -continue PT, OT, SLP  -improved activity tolerance 2.  DVT Prophylaxis/Anticoagulation: Chronic Coumadin for history of pulmonary emboli/hypercoagulable state. Monitor for any bleeding episodes  INR therapeutic 3. Pain Management: Oxycodone as needed, hx fibromyalgia  -robaxin PRN  -utilize ice/heat also  - low dose fentanyl patch 12.5 mcg but this may be contributing to MS changes, need to  d/c, also d/c schedule percocet 4. Mood: Cymbalta 60 mg daily  -appreciate neuropsych input  Pt feels that any depression in her mood has been related to increased pain. Feels better emotionally with better pain control. 5. Neuropsych: This patient is capable of making decisions on her own behalf. 6. Skin/Wound Care: Routine skin checks 7. Fluids/Electrolytes/Nutrition:    8. Parkinson disease. Sinemet 25-100 3 times a day. Followed by Dr. [name]  9. Hypertension. HCTZ 12.5 mg daily, Diovan 160 mg daily. Monitor with increased mobility 10. Hypothyroidism. Synthroid. TSH 0.61 11. Hyperlipidemia. Lipitor, Lovaza 12. Hypokalemia  Supplemented  Labs reviewed , normal 13.  MS changes UA neg, likely narcotic related, see above  LOS (Days) 9 A FACE TO FACE EVALUATION WAS PERFORMED  [name] [name] 12/29/2015 9:56 AM

## 2015-12-29 NOTE — Progress Notes (Signed)
Speech Language Pathology Daily Session Note  Patient Details  Name: [name] [name] MRN: [id number] Date of Birth: 12/25/1945  Today's Date: 12/29/2015 SLP Individual Time: 1300-1400 SLP Individual Time Calculation (min): 60 min   Short Term Goals: Week 2: SLP Short Term Goal 1 (Week 2): STG's=LTGs  Skilled Therapeutic Interventions:Skilled therapy intervention focused on cognitive goals. Patient required Max A verbal cues for initiation and problem solving during functional medication task, likely due to pain and lethargy. Patient sustained attention to task in intervals of about 30 seconds. Patient left upright in w/c with OT in room. Continue current plan of care.   Function:  Cognition Comprehension Comprehension assist level: Understands basic 75 - 89% of the time/ requires cueing 10 - 24% of the time  Expression   Expression assist level: Expresses basic 50 - 74% of the time/requires cueing 25 - 49% of the time. Needs to repeat parts of sentences.  Social Interaction Social Interaction assist level: Interacts appropriately 75 - 89% of the time - Needs redirection for appropriate language or to initiate interaction.  Problem Solving Problem solving assist level: Solves basic 25 - 49% of the time - needs direction more than half the time to initiate, plan or complete simple activities  Memory Memory assist level: Recognizes or recalls 50 - 74% of the time/requires cueing 25 - 49% of the time    Pain Pain Assessment Pain Type: Surgical pain Pain Location: Neck Pain Descriptors / Indicators: Aching Pain Onset: Gradual Pain Intervention(s): Medication (See eMAR)  Therapy/Group: Individual Therapy  Thornton Papas 12/29/2015, 3:45 PM

## 2015-12-29 NOTE — Progress Notes (Signed)
Occupational Therapy Session Note  Patient Details  Name: [name] [name] MRN: [id number] Date of Birth: April 10, 1945  Today's Date: 12/29/2015 OT Individual Time: [id number] OT Individual Time Calculation (min): 55 min     Short Term Goals:Week 1:  OT Short Term Goal 1 (Week 1): Pt will perform toileting with mod A sit to stand OT Short Term Goal 2 (Week 1): Pt will perform bed mobility with supervision without bed rails OT Short Term Goal 3 (Week 1): Pt will demonstrate intellecual awareness with min questioning cuing  OT Short Term Goal 4 (Week 1): Pt will demonstrate selective attention during functional tasks  Skilled Therapeutic Interventions/Progress Updates:    Pt seen for OT ADL bathing/dressing session. Pt sitting up in w/c upon arrival with hand off from SLP. Pt voicing increased neck pain,however, not due for medication at this time. She was agreeable to shower task. She ambulated throughout room with RW and supervision.VCs and tactile cuing for RW management in functional context. She completed bathing task with supervision, VCs required for correct sequencing of bathing task as pt attempting to wash hair before water turned on. Pt very distracted by pain throughout shower and duration of shower, requiring rest breaks throughout and requiring increased motivation for participation and completing tasks through in their entirety.  She dressed seated on toilet and then returned to supine for cervical brace pads to be changed. She required VCs for donning overhead shirt, threading shirt on B arms and then calling task complete. Education and demonstration provided for log rolling technique to come to EOB, able to come to EOB with supervision using this technique. Pt left supine in bed at end of session, refusing any further OOB or tx session. Left in supine with all needs in reach and bed alarm on.   Therapy Documentation Precautions:  Precautions Precautions: Fall, Cervical Required  Braces or Orthoses: Cervical Brace Cervical Brace: Hard collar, At all times Restrictions Weight Bearing Restrictions: No Pain: Pain Assessment Pain Score: 10-Worst pain ever Pain Location: Neck Pain Orientation: Posterior Pain Descriptors / Indicators: Aching Pain Intervention(s): Repositioned;Ambulation/increased activity;Shower  See Function Navigator for Current Functional Status.   Therapy/Group: Individual Therapy  Lewis, Mayco Walrond C 12/29/2015, 7:25 AM

## 2015-12-29 NOTE — Progress Notes (Signed)
Physical Therapy Session Note  Patient Details  Name: [name] [name] MRN: [id number] Date of Birth: 1945/10/13  Today's Date: 12/29/2015 PT Individual Time: 0900-1000 and 1100-1115 PT Individual Time Calculation (min): 60 min and 15 min (total 75 min)    Short Term Goals: Week 2:  PT Short Term Goal 1 (Week 2): =LTG due to estimated LOS  Skilled Therapeutic Interventions/Progress Updates:   tx 1: Pt received supine in bed, c/o pain as below and agreeable to treatment. Bed mobility with HOB elevated and bedrails with modI and increased time. Seated on EOB, performed dressing on EOB including bried, pants, shirt, and slip-on shoes with setupA and S for sit <>stand with RW, increased time required due to limited LE ROM and slow initiation of tasks. Gait with RW to/from sink 2x15' with min guard and occasional posterior LOB, requiring cues for stepping strategy to regain balance. W/c propulsion x150' with BUE for strengthening with S. Gait outdoors with RW and close S; max repetitive cues for step length and gait speed. Pt reports trying to increase step length feels "like walking through molasses", and "if I could step bigger I would". Able to correct step length 4-5 steps at most before reverting to small shuffled steps. Returned to room and pt elected to stay up in w/c; quick release belt donned, all needs in reach and pt verbalized understanding to use call bell if she wants to return to bed.   Tx 2: Pt received supine in bed, with quick release belt picked apart and pieces scattered on the floor, pt laying in bed with belt halfway underneath her trunk and w/c unlocked with footrests in place. Pt reports she went to bed by herself, and did not recall therapist instructing pt to use call bell. NT alerted to pt impulsivity, and recommend pt be in bed with alarm on if no family present. Pt sat up to EOB and donned shoes with S. Gait x60' with RW and S with improved gait speed over earlier session. Returned  to bed with S, and remained supine with alarm intact and all needs in reach at end of session.   Therapy Documentation Precautions:  Precautions Precautions: Fall, Cervical Required Braces or Orthoses: Cervical Brace Cervical Brace: Hard collar, At all times Restrictions Weight Bearing Restrictions: No Pain: Pain Assessment Pain Assessment: No/denies pain Pain Score: 0-No pain   See Function Navigator for Current Functional Status.   Therapy/Group: Individual Therapy  Luberta Mutter 12/29/2015, 10:08 AM

## 2015-12-29 NOTE — Progress Notes (Signed)
Social Work Patient ID: [name] [name], female   DOB: 1945-08-05, 70 y.o.   MRN: [id number]  Lowella Curb, LCSW Social Worker Signed   Patient Care Conference Date of Service: 12/29/2015  2:27 PM      Hide copied text Hover for attribution information Inpatient RehabilitationTeam Conference and Plan of Care Update Date: 12/28/2015   Time: 10:10 AM      Patient Name: [name] [name]      Medical Record Number: [id number]  Date of Birth: November 16, 1945 Sex: Female         Room/Bed: 4W20C/4W20C-01 Payor Info: Payor: MEDICARE / Plan: MEDICARE PART A AND B / Product Type: *No Product type* /     Admitting Diagnosis: C5 Fx  Admit Date/Time:  12/20/2015  6:22 PM Admission Comments: No comment available    Primary Diagnosis:  Displaced fracture of fifth cervical vertebra ([place]) Principal Problem: Displaced fracture of fifth cervical vertebra [facility])       Patient Active Problem List    Diagnosis Date Noted  . Benign essential HTN    . Intractable pain 12/17/2015  . Displaced fracture of fifth cervical vertebra ([place]) 12/17/2015  . Gait disturbance 12/17/2015  . Trochanteric bursitis of both hips 09/13/2015  . Chronic low back pain 06/07/2015  . Fibromyalgia syndrome 05/17/2015  . Hypothyroidism 05/17/2015  . Parkinson's disease ([place]) 05/17/2015  . Hypercoagulable state ([place]) 02/07/2012  . History of pulmonary embolism 02/07/2012  . S/P right TKA 05/22/2011      Expected Discharge Date: Expected Discharge Date: 01/01/16   Team Members Present: Physician leading conference: Dr. Delice Lesch Social Worker Present: Lennart Pall, LCSW Nurse Present: Dorien Chihuahua, RN PT Present: Kem Parkinson, PT OT Present: Napoleon Form, OT SLP Present: Weston Anna, SLP PPS Coordinator present : Daiva Nakayama, RN, CRRN       Current Status/Progress Goal Weekly Team Focus  Medical   C5 fracture after fall as well as history of Parkinson's disease.   Improve pain, cognitive function, mobility  See above     Bowel/Bladder   Continent of bowel and bladder; some urinary urgency- UA positive for bacteria, UC shows multiple species present; LBM 10/23 after sorbitol  mod I assist  Assess and treat for constipation as needed   Swallow/Nutrition/ Hydration             ADL's   Supervision- min A bathing/dressing with mod- max VCs due to impaired cognition  supervision   Cognitive re-training, ADL re-training, pain management, d/c planning, pain management, family ed   Mobility   S bed mobility and transfers, S/min guard gait with RW, S stairs with handails. Limited by cognitive impairments, poor frustration tolerance.  S overall, minA stairs  dynamic standing balance, activity tolerance, gait training   Communication             Safety/Cognition/ Behavioral Observations Min-Mod A  Supervision-Min A  problem solving, recall with use of aids, attention and awareness    Pain   C/o pain in neck; scheduled percocet d/cd along with fentanyl patch d/t cognition changes  < 4  Assess and treat for pain q shift and prn; adjust meds and monitor for effectiveness   Skin   bruising and scratches noted to neck area from brace; rash to back- sarna lotion ordered  Mod I assist  Assess skin q shift and prn     Rehab Goals Patient on target to meet rehab goals: Yes *See Care Plan and progress notes for long and short-term  goals.   Barriers to Discharge: Pain, cognitive deficits, now with UTI   Possible Resolutions to Barriers:  treat UTI, optimize pain meds/cognitive side effects, therapies   Discharge Planning/Teaching Needs:  home with 24/7 supervision from daughter and son-in-law  Teaching is ongoing.   Team Discussion:  Pain continues to be an issue as well as cognition.  UTI? Cognitive deficits since acute and suspect post concussive.  Easily confused and disoriented but this is inconsistent.  Can get very focused on neck brace.  Supervision overall with balance strategies.  Requires cueing and coaching through  activities.  Recommend HH to start.  Revisions to Treatment Plan:  None    Continued Need for Acute Rehabilitation Level of Care: The patient requires daily medical management by a physician with specialized training in physical medicine and rehabilitation for the following conditions: Daily direction of a multidisciplinary physical rehabilitation program to ensure safe treatment while eliciting the highest outcome that is of practical value to the patient.: Yes Daily medical management of patient stability for increased activity during participation in an intensive rehabilitation regime.: Yes Daily analysis of laboratory values and/or radiology reports with any subsequent need for medication adjustment of medical intervention for : Neurological problems;Post surgical problems   Sruti Ayllon 12/29/2015, 3:43 PM      Lowella Curb, [place] Worker Signed   Patient Care Conference Date of Service: 12/21/2015  4:13 PM      Hide copied text Hover for attribution information Inpatient RehabilitationTeam Conference and Plan of Care Update Date: 12/21/2015   Time: 2:20 PM      Patient Name: [name] [name]      Medical Record Number: [id number]  Date of Birth: Nov 08, 1945 Sex: Female         Room/Bed: 4W20C/4W20C-01 Payor Info: Payor: MEDICARE / Plan: MEDICARE PART A AND B / Product Type: *No Product type* /     Admitting Diagnosis: C5 Fx  Admit Date/Time:  12/20/2015  6:22 PM Admission Comments: No comment available    Primary Diagnosis:  <principal problem not specified> Principal Problem: <principal problem not specified>       Patient Active Problem List    Diagnosis Date Noted  . Intractable pain 12/17/2015  . Displaced fracture of fifth cervical vertebra ([place]) 12/17/2015  . Gait disturbance 12/17/2015  . Trochanteric bursitis of both hips 09/13/2015  . Chronic low back pain 06/07/2015  . Fibromyalgia syndrome 05/17/2015  . Hypothyroidism 05/17/2015  . Parkinson's disease ([place])  05/17/2015  . Hypercoagulable state ([place]) 02/07/2012  . History of pulmonary embolism 02/07/2012  . S/P right TKA 05/22/2011      Expected Discharge Date: Expected Discharge Date: 01/01/16   Team Members Present: Physician leading conference: Dr. Alger Simons Social Worker Present: Lennart Pall, LCSW Nurse Present: Heather Roberts, RN PT Present: Kem Parkinson, PT OT Present: Willeen Cass, OT SLP Present: Weston Anna, SLP PPS Coordinator present : Daiva Nakayama, RN, CRRN       Current Status/Progress Goal Weekly Team Focus  Medical   C5 fx after fall. hx of PD. baseline balance issue  improve pain, avoid cognitive setbacks due to analgesia  pain control, nutrition   Bowel/Bladder   Occasional incontinence urine due to urgency  Continent, no retention, no S/S UTI  Timed toileting    Swallow/Nutrition/ Hydration             ADL's   Mod A for bed mobility; min to mod A for ADL tasks, decr standing balance, incr pain  and decr tolerance to actiivty, decr recall and memory  supervision   cognitive remediation, actiivty tolerance, pain management, standing balance,   Mobility   modA bed mobility, minA sit <>stand, gait x25' with RW, decr recall and problem solving, incr pain  S overall, minA stairs  activity tolerance, pain management, gait training, transfers   Communication             Safety/Cognition/ Behavioral Observations Cognitive Eval today, Overall Min-Mod A  Supervision  complex problem solving, recall and attention    Pain   Neck, shoulders.  Taking Oxy IR Q 4-6 hrs.  Managed at goal 3/10.  Utilize alternative methods of pain control; K-pad, Robaxin ordered PRN, repositioning; monitor effectiveness.   Skin   Scattered bruising, mild MASD peri-area.  No s/s infection, ecchymosis resolving at D/C.  Monitor, timed toileting to reduce incontinence, MASD.     Rehab Goals Patient on target to meet rehab goals: Yes *See Care Plan and progress notes for long and short-term  goals.   Barriers to Discharge: premorbid balance issues related to PD   Possible Resolutions to Barriers:  continued NMR,equipment training, caregiver ed   Discharge Planning/Teaching Needs:  Home with intermittent assistance from daughter and son-in-law and son.  Teaching is ongoing.   Team Discussion:  New eval.  Must be caregul with pain meds as they will easily affect her cognition.  Goals set for overall supervision.  SW questioned if family will be able to provide this.  ST addressing higher level cognition.    Revisions to Treatment Plan:  None    Continued Need for Acute Rehabilitation Level of Care: The patient requires daily medical management by a physician with specialized training in physical medicine and rehabilitation for the following conditions: Daily direction of a multidisciplinary physical rehabilitation program to ensure safe treatment while eliciting the highest outcome that is of practical value to the patient.: Yes Daily medical management of patient stability for increased activity during participation in an intensive rehabilitation regime.: Yes Daily analysis of laboratory values and/or radiology reports with any subsequent need for medication adjustment of medical intervention for : Neurological problems;Post surgical problems   Kainen Struckman 12/22/2015, 10:27 AM

## 2015-12-30 ENCOUNTER — Inpatient Hospital Stay (HOSPITAL_COMMUNITY): Payer: Medicare Other | Admitting: Physical Therapy

## 2015-12-30 ENCOUNTER — Encounter (HOSPITAL_COMMUNITY): Payer: Medicare Other

## 2015-12-30 ENCOUNTER — Inpatient Hospital Stay (HOSPITAL_COMMUNITY): Payer: Medicare Other | Admitting: Occupational Therapy

## 2015-12-30 ENCOUNTER — Inpatient Hospital Stay (HOSPITAL_COMMUNITY): Payer: Medicare Other | Admitting: Speech Pathology

## 2015-12-30 ENCOUNTER — Telehealth: Payer: Self-pay | Admitting: Physical Medicine & Rehabilitation

## 2015-12-30 NOTE — Progress Notes (Signed)
Social Work Patient ID: [name] [name], female   DOB: Jul 20, 1945, 70 y.o.   MRN: [id number]   Have reviewed team conference with pt and family.  Pt reports better week and feeling she is ready for Saturday d/c.  Daughter to attend therapies tomorrow for education.  HH and DME arranged.  Continue to follow.  Kervens Roper, LCSW

## 2015-12-30 NOTE — Progress Notes (Signed)
Occupational Therapy Weekly Progress Note  Patient Details  Name: [name] [name] MRN: [id number] Date of Birth: [birth year]  Beginning of progress report period: December 20, 2015 End of progress report period: December 30, 2015     Patient has met 4 of 4 short term goals.  Pt is overall supervision for tasks. Family education planned for tomorrow with daughter. Pt still requires overall min to mod A for functional problem solving.   Patient continues to demonstrate the following deficits: decr recall of precautions, decr dynamic standing balance and decr awareness and attention and problem solving and therefore will continue to benefit from skilled OT intervention to enhance overall performance with BADL, iADL and Reduce care partner burden.  Patient progressing toward long term goals..  Continue plan of care.  OT Short Term Goals Week 1:  OT Short Term Goal 1 (Week 1): Pt will perform toileting with mod A sit to stand OT Short Term Goal 1 - Progress (Week 1): Met OT Short Term Goal 2 (Week 1): Pt will perform bed mobility with supervision without bed rails OT Short Term Goal 2 - Progress (Week 1): Met OT Short Term Goal 3 (Week 1): Pt will demonstrate intellecual awareness with min questioning cuing  OT Short Term Goal 3 - Progress (Week 1): Met OT Short Term Goal 4 (Week 1): Pt will demonstrate selective attention during functional tasks OT Short Term Goal 4 - Progress (Week 1): Met Week 2:  OT Short Term Goal 1 (Week 2): STG=LTG   Skilled Therapeutic Interventions/Progress Updates:     Therapy Documentation Precautions:  Precautions Precautions: Fall, Cervical Required Braces or Orthoses: Cervical Brace Cervical Brace: Hard collar, At all times Restrictions Weight Bearing Restrictions: No General:   Vital Signs: Therapy Vitals Temp: 98.4 F (36.9 C) Temp Source: Oral Pulse Rate: (!) 102 Resp: 20 BP: 102/90 Patient Position (if appropriate): Lying Oxygen Therapy SpO2:  95 % O2 Device: Not Delivered  See Function Navigator for Current Functional Status.   Therapy/Group: Individual Therapy  Willeen Cass [facility] 12/30/2015, 4:18 PM

## 2015-12-30 NOTE — Plan of Care (Signed)
Problem: RH BOWEL ELIMINATION Goal: RH STG MANAGE BOWEL WITH ASSISTANCE STG Manage Bowel with mod  Assistance.   Outcome: Not Progressing LBM 12-27-15 Goal: RH STG MANAGE BOWEL W/MEDICATION W/ASSISTANCE STG Manage Bowel with Medication with mod I Assistance.   Outcome: Not Progressing LBM 12-27-15

## 2015-12-30 NOTE — Consult Note (Signed)
NEUROCOGNITIVE [place]   [name] [name] is a 70 year old, right-handed, widowed, Caucasian woman, with a diagnosis of Parkinson's disease, who was seen for a previous brief neuropsychological assessment to assess cognitive and emotional functioning.  Results revealed marked cognitive disruption across domains, with unclear etiology that was likely multifactorial.  Given her extreme fatigue and potential poor engagement at the time of that evaluation, a follow-up neuropsychological testing session was recommended.  That was the purpose of today's session.     PROCEDURES: [2 units of 57846 on 12/30/2015]  The following tests were performed during today's visit: Brief Clinical Interview, Repeatable Battery for the Assessment of Neuropsychological Status (RBANS, form C). Test results are as follows:   RBANS Indices Scaled Score Percentile Description  Immediate Memory  81 10 Below Average  Visuospatial/Constructional 96 39 Average  Language 82 12 Below Average  Attention 64 1 Profoundly Impaired  Delayed Memory 64 1 Profoundly Impaired  Total Score 71 3 Impaired   RBANS Subtests Raw Score Percentile Description  List Learning 23 23 Below Average  Story Memory 11 4 Impaired  Figure Copy 18 55 Average  Line Orientation 16 45 Average  Picture Naming 9 19 Below Average  Semantic Fluency 9 2 Profoundly Impaired  Digit Span 8 16 Below Average  Coding 14 < 1 Profoundly Impaired  List Recall 3 21 Below Average  List Recognition 17 3 Impaired  Story Recall 1 < 1 Profoundly Impaired  Figure recall 6 6 Impaired   An embedded measure of performance validity was suggestive of strong task engagement and there were no behavioral manifestations to suggest suboptimal effort.  Therefore, the current test results likely represent an accurate portrayal of her current cognitive abilities at this time.    Test results revealed impairment in attention,  which was predominantly driven by slow processing speed, and delayed memory.  In addition, her performances were below average in immediate memory and language skills, again driven by slow processing speed.  Visuospatial abilities were intact.  In comparison to her previous test results last week, the current scores represent marked improvement across cognitive domains.  In fact, there was only one score that was significantly worse than last week and that was on a subtest requiring immediate memory for contextually-related information.  It is noteworthy that while she still demonstrated impairments on many cognitive tasks, her performances were far improved from last week when several tasks had to be discontinued owing to confusion or possible poor effort.    Overall, her scores remain at a level concerning for the presence of a Major Neurocognitive Disorder.  Additionally, the exact etiology of her deficits remains unclear and likely multifactorial, including potential contribution from Parkinson's disease, fatigue, and pain.  At this time, it is difficult to determine whether she also has underlying Parkinson's disease dementia or other potential neurodegenerative condition.  It is advisable for her to seek a more comprehensive neuropsychological evaluation in approximately 3 months to better ascertain the nature and extent of cognitive difficulties that may be beyond those expected in the setting of Parkinson's disease.    In light of these findings, the following recommendations are provided.    RECOMMENDATIONS:  Recommendations for treatment team:   . When interacting with [name] [name], directions and information should be provided in a simple, straight forward manner, and the treatment team should avoid giving multiple instructions simultaneously.  . [name] [name] responded well to cueing from the examiner.  Therefore, if she does  not immediately recall something she has been told, it may be useful to  provide her with a hint to see if she can pull up the needed information.   . [name] [name] memory will likely be better when she is actively engaged in trying to remember information.  To aid in this process, when telling her important details, staff should inform her that they would like her to try and remember what they are about to say, rather than telling her and expecting her to passively recall the information.   . [name] [name] may also benefit from being provided with multiple trials to learn new skills given the noted memory inefficiencies.  . To the extent possible, multitasking should be avoided. . [name] [name] requires more time than typical to process information. The treatment team may benefit from waiting for a verbal response to information before presenting additional information.  . Performance will generally be best in a structured, routine, and familiar environment, as opposed to situations involving complex problems.   Recommendations for discharge planning:  . Complete a comprehensive neuropsychological evaluation as an outpatient in 3 months to further discern the nature and extent of cognitive difficulties.  Contact information for a neuropsychologist in her area should be included in her discharge paperwork for this purpose.   . Maintain engagement in mentally, physically and cognitively stimulating activities.  . Strive to maintain a healthy lifestyle (e.g., proper diet and exercise) in order to promote physical, cognitive and emotional health.  . Establishing a power of attorney is warranted.  . [name] [name] demonstrated marked improvement in visuospatial functioning, which was intact on today's evaluation.  However, given evidence of slowed processing speed, it still may not be wise for her to immediately resume driving.      [name] [name], Psy.D.  Clinical Neuropsychologist

## 2015-12-30 NOTE — Progress Notes (Signed)
Speech Language Pathology Daily Session Note  Patient Details  Name: [name] [name] MRN: [id number] Date of Birth: November 26, 1945  Today's Date: 12/30/2015 SLP Individual Time: 1030-1130 SLP Individual Time Calculation (min): 60 min   Short Term Goals: Week 2: SLP Short Term Goal 1 (Week 2): STG's=LTGs  Skilled Therapeutic Interventions: Skilled treatment focused on cognition goals. SLP facilitated the session by providing Max A faded to Mod A verbal cues for basic problem solving such as creating grocery list and simple math problems. Pt required Max A verbal cues for consistent recall of therapists' names (even therapist present) with no use of compensatory strategies to problem solve names. Pt benefited from Mod A verbal cues to utilize external aides to recall orientation information and therapy information. Pt was able to demonstrate selective attention for ~5 minutes in moderately distracting environment. Pt with decreased safety awareness as she stated that she didn't need any assistance getting up and going to bathroom. Education provided on use of call bell and utilizing assistance for out of bed activities. Pt was returned to bed, bed alarm on, all needs within reach reach. Continue current plan of care.   Function:    Cognition Comprehension Comprehension assist level: Understands basic 75 - 89% of the time/ requires cueing 10 - 24% of the time  Expression   Expression assist level: Expresses basic 50 - 74% of the time/requires cueing 25 - 49% of the time. Needs to repeat parts of sentences.  Social Interaction Social Interaction assist level: Interacts appropriately 75 - 89% of the time - Needs redirection for appropriate language or to initiate interaction.  Problem Solving Problem solving assist level: Solves basic 25 - 49% of the time - needs direction more than half the time to initiate, plan or complete simple activities  Memory Memory assist level: Recognizes or recalls 50 - 74%  of the time/requires cueing 25 - 49% of the time    Pain Pain Assessment Pain Assessment: 0-10 Pain Score: 5  Pain Type: Acute pain Pain Location: Neck Pain Orientation: Mid Pain Descriptors / Indicators: Aching Pain Onset: Gradual Pain Intervention(s): Ambulation/increased activity;Repositioned;Cold applied Multiple Pain Sites: No  Therapy/Group: Individual Therapy  Travaughn Vue 12/30/2015, 12:37 PM

## 2015-12-30 NOTE — Progress Notes (Signed)
Orthopedic Tech Progress Note Patient Details:  [name] [name] 06-Dec-1945 [id number] Called in pediatric cervical collar brace order into [facility]. Patient ID: [name] [name], female   DOB: April 01, 1945, 70 y.o.   MRN: [id number]   [name] [name] 12/30/2015, 1:49 PM

## 2015-12-30 NOTE — Progress Notes (Signed)
Occupational Therapy Session Note  Patient Details  Name: [name] [name] MRN: [id number] Date of Birth: [birth year]  Today's Date: 12/30/2015 OT Individual Time: 1300-1415 OT Individual Time Calculation (min): 75 min       Skilled Therapeutic Interventions/Progress Updates:   1:1 Therapeutic activities to include folding her laundry sit to stands, donning shoes (crossing feet), functional ambulation with RW, trial of new collar, activity tolerance and strengthening on Nustep and functional cognitive task.  Pt able to perform standing task of folding laundry with bilateral UE with occasional min A to supervision. Pt able to don shoes with setup with extra time. Pt able to ambulate with RW with supervision. Trial of metronome to assist with speed to decr fall risk; did assist with min VC. Pt able to perform 5 min on Nustep on level 6 resistance (without UE use) focusing on strengthening LEs. Trial of wedge in different place of feet to encourage anterior weight shift to decr LOB posteriorly but could tolerate in shoes.  Pt with new delivery of Philipdepha collar - pt liked it at first but then couldn't tolerate it so redonned on other pediatric broken one.   Pt perform working memory and functional problem solving task of remembering a question then traveling with RW to look at map to find the answer. Pt able to perform with minimal cues for 6 turns before needing to sit.  Returned to bed to rest.   Therapy Documentation Precautions:  Precautions Precautions: Fall, Cervical Required Braces or Orthoses: Cervical Brace Cervical Brace: Hard collar, At all times Restrictions Weight Bearing Restrictions: No General:   Vital Signs: Therapy Vitals Temp: 98.4 F (36.9 C) Temp Source: Oral Pulse Rate: (!) 102 Resp: 20 BP: 102/90 Patient Position (if appropriate): Lying Oxygen Therapy SpO2: 95 % O2 Device: Not Delivered Pain:  little pain but able to continue; allowed for supine rest at  end of session  See Function Navigator for Current Functional Status.   Therapy/Group: Individual Therapy  Willeen Cass [facility] 12/30/2015, 4:06 PM

## 2015-12-30 NOTE — Progress Notes (Signed)
[name] [name] PHYSICAL MEDICINE & REHABILITATION     PROGRESS NOTE    Subjective/Complaints: Less confused. Recognized me. Having neck pain. Ready to get home. Mood a little depressed  ROS: Denies CP, SOB, N/V/D.  Objective: Vital Signs: Blood pressure 130/71, pulse 91, temperature 98.4 F (36.9 C), temperature source Oral, resp. rate 16, height 5\' 6"  (1.676 m), weight 88.3 kg (194 lb 9.6 oz), SpO2 95 %. No results found. No results for input(s): WBC, HGB, HCT, PLT in the last 72 hours. No results for input(s): NA, K, CL, GLUCOSE, BUN, CREATININE, CALCIUM in the last 72 hours.  Invalid input(s): CO CBG (last 3)  No results for input(s): GLUCAP in the last 72 hours.  Wt Readings from Last 3 Encounters:  12/29/15 88.3 kg (194 lb 9.6 oz)  12/17/15 88.5 kg (195 lb)  11/24/15 89.4 kg (197 lb 3.2 oz)    Physical Exam:  Gen: Well developed. NAD.  HENT: Normocephalic. Atraumatic. Eyes: EOMare normal. No discharge.  Neck: C-collar  Cardiovascular: Normal rateand regular rhythm.  Respiratory: Effort normaland breath sounds normal. No respiratory distress.  GI: Soft. Bowel sounds are normal. She exhibits no distension.  Neurological: Alert and oriented. Motor: Remains grossly 4+/5 throughout Skin: Skin is warmand dry.  Psychiatric: Normal mood and affect.  Assessment/Plan: 1. Gait and functional deficits secondary to C5 Fx (hx of parkinson's disease) which require 3+ hours per day of interdisciplinary therapy in a comprehensive inpatient rehab setting. Physiatrist is providing close team supervision and 24 hour management of active medical problems listed below. Physiatrist and rehab team continue to assess barriers to discharge/monitor patient progress toward functional and medical goals.  Function:  Bathing Bathing position   Position: Shower  Bathing parts Body parts bathed by patient: Right arm, Left arm, Chest, Abdomen, Front perineal area, Buttocks, Right upper leg,  Left upper leg, Right lower leg, Left lower leg Body parts bathed by helper: Back  Bathing assist Assist Level: Supervision or verbal cues      Upper Body Dressing/Undressing Upper body dressing   What is the patient wearing?: Pull over shirt/dress     Pull over shirt/dress - Perfomed by patient: Thread/unthread right sleeve, Thread/unthread left sleeve, Put head through opening, Pull shirt over trunk Pull over shirt/dress - Perfomed by helper: Pull shirt over trunk Button up shirt - Perfomed by patient: Thread/unthread right sleeve, Thread/unthread left sleeve, Button/unbutton shirt Button up shirt - Perfomed by helper: Pull shirt around back    Upper body assist Assist Level: Set up      Lower Body Dressing/Undressing Lower body dressing   What is the patient wearing?: Pants Underwear - Performed by patient: Thread/unthread left underwear leg, Pull underwear up/down Underwear - Performed by helper: Thread/unthread right underwear leg Pants- Performed by patient: Thread/unthread right pants leg, Thread/unthread left pants leg, Pull pants up/down Pants- Performed by helper: Thread/unthread right pants leg, Thread/unthread left pants leg Non-skid slipper socks- Performed by patient: Don/doff left sock Non-skid slipper socks- Performed by helper: Don/doff right sock Socks - Performed by patient:  (able to doff not donn) Socks - Performed by helper: Don/doff right sock, Don/doff left sock Shoes - Performed by patient: Don/doff right shoe, Don/doff left shoe Shoes - Performed by helper: Don/doff right shoe, Don/doff left shoe       TED Hose - Performed by helper: Don/doff right TED hose, Don/doff left TED hose  Lower body assist Assist for lower body dressing: Touching or steadying assistance (Pt > 75%)  Toileting Toileting   Toileting steps completed by patient: Adjust clothing prior to toileting, Performs perineal hygiene, Adjust clothing after toileting Toileting steps  completed by helper: Adjust clothing prior to toileting, Performs perineal hygiene, Adjust clothing after toileting Toileting Assistive Devices: Grab bar or rail  Toileting assist Assist level: Supervision or verbal cues   Transfers Chair/bed transfer   Chair/bed transfer method: Ambulatory Chair/bed transfer assist level: Supervision or verbal cues Chair/bed transfer assistive device: Armrests, Medical sales representative     Max distance: 175 Assist level: Supervision or verbal cues   Wheelchair   Type: Manual Max wheelchair distance: 150 Assist Level: Supervision or verbal cues  Cognition Comprehension Comprehension assist level: Understands basic 75 - 89% of the time/ requires cueing 10 - 24% of the time  Expression Expression assist level: Expresses basic 50 - 74% of the time/requires cueing 25 - 49% of the time. Needs to repeat parts of sentences.  Social Interaction Social Interaction assist level: Interacts appropriately 75 - 89% of the time - Needs redirection for appropriate language or to initiate interaction.  Problem Solving Problem solving assist level: Solves basic 25 - 49% of the time - needs direction more than half the time to initiate, plan or complete simple activities  Memory Memory assist level: Recognizes or recalls 50 - 74% of the time/requires cueing 25 - 49% of the time   Medical Problem List and Plan: 1.  Debilitation secondary to C5 fracture after fall as well as history of Parkinson's disease. Cervical collar at all times             -continue PT, OT, SLP  -improved activity tolerance 2.  DVT Prophylaxis/Anticoagulation: Chronic Coumadin for history of pulmonary emboli/hypercoagulable state. Monitor for any bleeding episodes  INR therapeutic 3. Pain Management: Oxycodone as needed, hx fibromyalgia  -robaxin PRN  - dc'ed scheduled fentanyl and percocet---appears that there has been some improvement today  -continue modalities/alternate means of  pain control. 4. Mood: Cymbalta 60 mg daily  -appreciate neuropsych input  Pt feels that any depression in her mood has been related to increased pain. Feels better emotionally with better pain control. 5. Neuropsych: This patient is capable of making decisions on her own behalf. 6. Skin/Wound Care: Routine skin checks 7. Fluids/Electrolytes/Nutrition:    8. Parkinson disease. Sinemet 25-100 3 times a day. Followed by Dr. [name]  9. Hypertension. HCTZ 12.5 mg daily, Diovan 160 mg daily. Monitor with increased mobility 10. Hypothyroidism. Synthroid. TSH 0.61 11. Hyperlipidemia. Lipitor, Lovaza 12. Hypokalemia  Supplemented  Labs reviewed , normal 13.  MS changes UA neg, likely narcotic related, see above  -ucx multispecies---recollection today  LOS (Days) 10 A FACE TO FACE EVALUATION WAS PERFORMED  SWARTZ,ZACHARY T 12/30/2015 9:51 AM

## 2015-12-30 NOTE — Progress Notes (Signed)
Physical Therapy Session Note  Patient Details  Name: [name] [name] MRN: [id number] Date of Birth: December 17, 1945  Today's Date: 12/30/2015 PT Individual Time: 0800-0900 PT Individual Time Calculation (min): 60 min    Short Term Goals: Week 2:  PT Short Term Goal 1 (Week 2): =LTG due to estimated LOS  Skilled Therapeutic Interventions/Progress Updates:   Pt received supine in bed, c/o pain as below and agreeable to treatment. Supine>sit with modI with HOB elevated and bedrails. Gait within room with RW and S to/from bathroom and to/from sink back to bed. Toileting with S with RW for standing balance. Performed upper/lower body bathing and dressing while seated on toilet with S overall, min cues for sequencing, attention to task. Pt attempted to eat breakfast, however ate one piece of candy and then stated she was not hungry. Gait to gym with RW and S x175', improved gait speed and pt initiating larger steps without cues from therapist. Nustep x10 min per pt request; level 5 with BLE with average 40 steps/min for LE strengthening and endurance. Gait to return to room with RW and S as above. D/c planning education with pt, discussed equipment and follow up home health PT. Remained supine in bed at end of session, all needs in reach.   Therapy Documentation Precautions:  Precautions Precautions: Fall, Cervical Required Braces or Orthoses: Cervical Brace Cervical Brace: Hard collar, At all times Restrictions Weight Bearing Restrictions: No Pain: Pain Assessment Pain Assessment: 0-10 Pain Score: 5  Pain Type: Acute pain Pain Location: Neck Pain Orientation: Mid Pain Descriptors / Indicators: Aching Pain Onset: Gradual Pain Intervention(s): Ambulation/increased activity;Repositioned;Cold applied Multiple Pain Sites: No   See Function Navigator for Current Functional Status.   Therapy/Group: Individual Therapy  Luberta Mutter 12/30/2015, 9:14 AM

## 2015-12-31 ENCOUNTER — Inpatient Hospital Stay (HOSPITAL_COMMUNITY): Payer: Medicare Other | Admitting: Physical Therapy

## 2015-12-31 ENCOUNTER — Encounter (HOSPITAL_COMMUNITY): Payer: Medicare Other | Admitting: Occupational Therapy

## 2015-12-31 ENCOUNTER — Inpatient Hospital Stay (HOSPITAL_COMMUNITY): Payer: Medicare Other | Admitting: Occupational Therapy

## 2015-12-31 ENCOUNTER — Inpatient Hospital Stay (HOSPITAL_COMMUNITY): Payer: Medicare Other | Admitting: Speech Pathology

## 2015-12-31 ENCOUNTER — Ambulatory Visit (HOSPITAL_COMMUNITY): Payer: Medicare Other | Admitting: Physical Therapy

## 2015-12-31 LAB — URINE CULTURE: Special Requests: NORMAL

## 2015-12-31 LAB — PROTIME-INR
INR: 2.68
PROTHROMBIN TIME: 29.1 s — AB (ref 11.4–15.2)

## 2015-12-31 MED ORDER — WARFARIN SODIUM 3 MG PO TABS: 3.0000 mg | ORAL_TABLET | Freq: Every day | ORAL | 0 refills | Status: DC

## 2015-12-31 MED ORDER — DULOXETINE HCL 60 MG PO CPEP: 60.0000 mg | ORAL_CAPSULE | Freq: Every day | ORAL | 5 refills | Status: DC

## 2015-12-31 MED ORDER — OXYCODONE HCL 10 MG PO TABS: 10.0000 mg | ORAL_TABLET | ORAL | 0 refills | Status: DC | PRN

## 2015-12-31 MED ORDER — METHOCARBAMOL 500 MG PO TABS: 500.0000 mg | ORAL_TABLET | Freq: Four times a day (QID) | ORAL | 0 refills | Status: DC | PRN

## 2015-12-31 MED ORDER — SYNTHROID 150 MCG PO TABS: 150.0000 ug | ORAL_TABLET | Freq: Every day | ORAL | 1 refills | Status: DC

## 2015-12-31 MED ORDER — ATORVASTATIN CALCIUM 10 MG PO TABS: 10.0000 mg | ORAL_TABLET | Freq: Every day | ORAL | 1 refills | Status: DC

## 2015-12-31 MED ORDER — DULOXETINE HCL 60 MG PO CPEP: 60.0000 mg | ORAL_CAPSULE | Freq: Every day | ORAL | 3 refills | Status: DC

## 2015-12-31 MED ORDER — CELECOXIB 100 MG PO CAPS: 100.0000 mg | ORAL_CAPSULE | Freq: Two times a day (BID) | ORAL | 1 refills | Status: DC

## 2015-12-31 MED ORDER — GUAIFENESIN ER 600 MG PO TB12: 600.0000 mg | ORAL_TABLET | Freq: Two times a day (BID) | ORAL | 0 refills | Status: DC

## 2015-12-31 MED ORDER — DULOXETINE HCL 60 MG PO CPEP: 60.0000 mg | ORAL_CAPSULE | Freq: Every day | ORAL | 0 refills | Status: DC

## 2015-12-31 MED ORDER — POTASSIUM CHLORIDE CRYS ER 20 MEQ PO TBCR: 20.0000 meq | EXTENDED_RELEASE_TABLET | Freq: Every day | ORAL | 0 refills | Status: DC

## 2015-12-31 NOTE — Progress Notes (Signed)
Speech Language Pathology Session Note & Discharge Summary  Patient Details  Name: [name] [name] MRN: [id number] Date of Birth: Jul 06, 1945  Today's Date: 12/31/2015 SLP Individual Time: 3795-5831 SLP Individual Time Calculation (min): 30 min   Skilled Therapeutic Interventions:   Skilled treatment session focused on completion of patient and family education. The patient and her daughter were educated in regards to the patient's current cognitive function and strategies to utilize at home to maximize recall and overall safety as well as the importance of 24 hour supervision. Both verbalized understanding and a handout was given to reinforce information. Patient handed off to PT.   Patient has met 3 of 4 long term goals.  Patient to discharge at overall Mod;Min level.   Reasons goals not met: Patient requires Mod A for functional problem solving    Clinical Impression/Discharge Summary: Patient has made functional gains and has met 3 of 4 LTG's this reporting period due to improved cognitive function. Currently, patient requires overall Min A for selective attention and awareness and Mod A for problem solving and recall of functional information. Patient and family education is complete and patient will discharge home with 24 hour supervision from family. Patient would benefit from f/u SLP services to maximize cognitive function and overall functional independence in order to reduce caregiver burden.   Care Partner:  Caregiver Able to Provide Assistance: Yes  Type of Caregiver Assistance: Physical;Cognitive  Recommendation:  24 hour supervision/assistance;Home Health SLP  Rationale for SLP Follow Up: Maximize cognitive function and independence;Reduce caregiver burden   Equipment: N/A   Reasons for discharge: Discharged from hospital   Patient/Family Agrees with Progress Made and Goals Achieved: Yes   Function:   Cognition Comprehension Comprehension assist level: Understands basic  75 - 89% of the time/ requires cueing 10 - 24% of the time  Expression   Expression assist level: Expresses basic needs/ideas: With extra time/assistive device  Social Interaction Social Interaction assist level: Interacts appropriately with others with medication or extra time (anti-anxiety, antidepressant).  Problem Solving Problem solving assist level: Solves basic 50 - 74% of the time/requires cueing 25 - 49% of the time  Memory Memory assist level: Recognizes or recalls 50 - 74% of the time/requires cueing 25 - 49% of the time   Adriano Bischof 12/31/2015, 3:57 PM

## 2015-12-31 NOTE — Progress Notes (Signed)
ANTICOAGULATION CONSULT NOTE - Follow Up Consult  Pharmacy Consult for coumadin Indication: hypercoagulable state  Allergies  Allergen Reactions  . Crestor [Rosuvastatin Calcium] Other (See Comments)    Feeling very bad, aching all over.  . Erythromycin Hives  . Gabapentin Other (See Comments)    Blurred vision  . Pregabalin Other (See Comments)    Crossed vision.  . Carbidopa Other (See Comments)    Headaches, nausea, dizziness  . Macrobid [Nitrofurantoin] Hives  . Losartan Other (See Comments)    Patient Measurements: Height: 5\' 6"  (167.6 cm) Weight: 194 lb 9.6 oz (88.3 kg) IBW/kg (Calculated) : 59.3 Heparin Dosing Weight:   Vital Signs: Temp: 98 F (36.7 C) (10/27 0522) Temp Source: Oral (10/27 0522) BP: 116/54 (10/27 0522) Pulse Rate: 83 (10/27 0522)  Labs:  Recent Labs  12/29/15 0658 12/31/15 0540  LABPROT 28.6* 29.1*  INR 2.63 2.68    Estimated Creatinine Clearance: 71.1 mL/min (by C-G formula based on SCr of 0.71 mg/dL).   Assessment: 70 yo female with hypercoagulable state is currently on therapeutic coumadin.  INR today is 2.68. Plan for discharge on 10/28  PTA dose: 4mg  daily, but recently INR has been stable and therapeutic on 3mg  daily   Goal of Therapy:  INR 2-3 Monitor platelets by anticoagulation protocol: Yes   Plan:  - Continue coumadin 3mg  po daily - If discharge home, recommend continue 3mg  daily for now. - INR MWF  Maryanna Shape, PharmD, BCPS  Clinical Pharmacist  Pager: [phone]   12/31/2015,11:35 AM

## 2015-12-31 NOTE — Progress Notes (Signed)
Orthopedic Tech Progress Note Patient Details:  [name] [name] [birth year] [id number] Advanced completed C-Collar order. Patient ID: [name] [name], female   DOB: 19-Jul-1945, 70 y.o.   MRN: [id number]   [name] [name] 12/31/2015, 10:59 AM

## 2015-12-31 NOTE — Discharge Summary (Signed)
[name] [name], MOOTY NO.:  [id number]  MEDICAL RECORD NO.:  [id number]  LOCATION:  [id number]                        FACILITY:  [facility]  PHYSICIAN:  [name] [name], M.D.DATE OF BIRTH:  1945/08/21  DATE OF ADMISSION:  12/20/2015 DATE OF DISCHARGE:  01/01/2016                              DISCHARGE SUMMARY   DISCHARGE DIAGNOSES: 1. C5 fracture after a fall. 2. Chronic Coumadin for history of pulmonary emboli, hypercoagulable     state. 3. Parkinson's disease. 4. Pain management with history of fibromyalgia. 5. Depression. 6. Hypertension. 7. Hypothyroidism. 8. Hyperlipidemia. 9. Hypokalemia, resolved.  HISTORY OF PRESENT ILLNESS:  This is a 70 year old right-handed female with history of fibromyalgia, pulmonary emboli on chronic Coumadin, Parkinson's disease with gait disturbance, followed by Dr. [name] at [facility] neurological.  Per chart review, lives alone, multi-level home, daughter is a Marine scientist.  The patient with reported 7 falls in the past 2 years.  Presented on December 17, 2015, after a fall with significant neck pain radiating to the shoulders.  Cranial CT scan negative for acute changes.  CT of cervical spine showed moderately displaced fractures involving the posterior spinous process of C5. Conservative care, placed in a cervical collar.  Hospital course, pain management.  Intermittent bouts of confusion.  Cranial CT scan x2 negative.  Chronic Coumadin ongoing for pulmonary emboli.  Physical and occupational therapy evaluations completed.  The patient was admitted for comprehensive rehab program.  PAST MEDICAL HISTORY:  See discharge diagnoses.  SOCIAL HISTORY:  Lives alone.  Local daughter is a Marine scientist.  The patient reported to be independent prior to admission, using a rolling walker as an assistive device.  FUNCTIONAL STATUS:  Upon admission to [facility], was moderate assist, stand pivot transfers; max assist, sit to stand;  max-total assist, activities of daily living.  PHYSICAL EXAMINATION:  VITAL SIGNS:  Blood pressure 126/54, pulse 77, temperature 97, respirations 20. GENERAL:  This was an alert female, she made good eye contact with examiner.  She would not initiate conversation, but does answer appropriately for person, place and time. LUNGS:  Clear to auscultation without wheeze. CARDIAC:  Regular rhythm without murmur. ABDOMEN:  Soft, nontender.  Good bowel sounds.  Cervical collar in place. PSYCHIATRIC:  Mood remained flat and disengaged during exam.  [place] COURSE:  The patient was admitted to Inpatient Rehab Services with therapies initiated on a 3-hour daily basis, consisting of physical therapy, occupational therapy, speech therapy and rehabilitation nursing.  The following issues were addressed during the patient's rehabilitation stay.  Pertaining to [name], C5 fracture after a fall.  Cervical collar in place.  She had been seen by Dr. Cyndy Freeze.  She would follow up outpatient.  She remained on chronic Coumadin for history of pulmonary emboli.  Home health nursing had been arranged.  She was following up with Dr. Lamar Blinks, her PCP, [phone], no bleeding episodes.  Chronic pain management with fibromyalgia. Currently, on oxycodone.  She had been on Ultram prior to admission. Using Robaxin as needed.  She was on a fentanyl patch, this was discontinued due to some altered mental status.  Blood pressure is well controlled.  No orthostatic changes.  She did have a history of Parkinson's disease, remained on Sinemet.  She would follow up with Dr. [name], outpatient neurological.  Hypothyroidism with hormone supplement as advised.  Bouts of hypokalemia resolved with potassium supplement.  The patient received weekly collaborative interdisciplinary team conferences to discuss estimated length of stay, family teaching, any barriers to her discharge.  She was ambulating  supervision 175 feet, improved gait speed, initiating larger steps without cues.  Toileting with supervision, rolling walker for standing balance.  Gait within the room with rolling walker, supervision to and from the bathroom.  She could gather belongings for activities of daily living and homemaking.  She was tolerating a regular diet.  She needed some basic cues for some problem solving such as creating a grocery list, simple math problems, this was discussed at length with her daughter.  Full family teaching was completed.  Plan, discharge to home with recommendations ongoing for supervision for her safety.  DISCHARGE MEDICATIONS: 1. Aspirin 81 mg p.o. daily. 2. Lipitor 10 mg p.o. daily. 3. Astelin nasal spray 1 spray each nostril twice daily. 4. Sinemet CR 25-100 one p.o. t.i.d. 5. Celebrex 100 mg p.o. b.i.d. 6. Restasis one drop both eyes twice daily. 7. Cymbalta 60 mg p.o. daily. 8. Hydrochlorothiazide 12.5 mg p.o. daily. 9. Synthroid 150 mcg p.o. daily. 10.Magnesium oxide 200 mg p.o. daily. 11.Multivitamin daily. 12.Lovaza 1 g p.o. daily. 13.K-Dur 20 mEq p.o. daily. 14.Senokot-S two tablets b.i.d., hold for loose stools. 15.Diovan 160 mg p.o. daily. 16.Coumadin latest dose of 3 mg, adjusted accordingly for INR of 2.00-     3.00. 17.Ambien 5 mg p.o. at bedtime. 18.Robaxin 500 mg q.i.d. as needed. 19.Oxycodone 10 mg p.o. every 4 hours as needed pain, dispense of 20     tablets.  DIET:  Regular.  FOLLOWUP:  She would follow up with Dr. Alysia [name] at the [facility] as directed; Dr. Marland Kitchen [name], call for appointment; Dr. Wells Guiles [name]; Dr. Lamar Blinks, medical management.  A home health nurse had been arranged to check INR on January 05, 2016, results to Dr. Lamar Blinks, [phone], Fax# [phone].     [name] [name], P.A.   ______________________________ [name] [name], M.D.    DA/MEDQ  D:  12/31/2015  T:   12/31/2015  Job:  [id number]  cc:   Dr. Wells Guiles [name] Cyndy Freeze, M.D. Charlett Blake, M.D. Darreld Mclean, MD

## 2015-12-31 NOTE — Discharge Instructions (Signed)
Inpatient Rehab Discharge Instructions  [name] [name] Discharge date and time: No discharge date for patient encounter.   Activities/Precautions/ Functional Status: Activity: Cervical collar at all times Diet: regular diet Wound Care: none needed Functional status:  ___ No restrictions     ___ Walk up steps independently ___ 24/7 supervision/assistance   ___ Walk up steps with assistance ___ Intermittent supervision/assistance  ___ Bathe/dress independently ___ Walk with walker     _x__ Bathe/dress with assistance ___ Walk Independently    ___ Shower independently ___ Walk with assistance    ___ Shower with assistance ___ No alcohol     ___ Return to work/school ________   COMMUNITY REFERRALS UPON DISCHARGE:    Home Health:   PT     OT     ST    RN                      Agency:  [facility]      Phone: [phone]   Medical Equipment/Items Ordered:  Wheelchair, cushion, hospital bed, tub bench                                                      Agency/Supplier:  [employer] [phone]    GENERAL COMMUNITY RESOURCES FOR PATIENT/FAMILY:  Mental Health:  Follow up with Neuropsychology for further testing as needed                             Norton Pastel, PsyD @ [phone]      Special Instructions: Home health nurse to check INR on 01/05/2016 results to Dr. Lamar Blinks [phone] fax number [phone]   My questions have been answered and I understand these instructions. I will adhere to these goals and the provided educational materials after my discharge from the hospital.  Patient/Caregiver Signature _______________________________ Date __________  Clinician Signature _______________________________________ Date __________  Please bring this form and your medication list with you to all your follow-up doctor's appointments.

## 2015-12-31 NOTE — Progress Notes (Signed)
Occupational Therapy Discharge Summary  Patient Details  Name: [name] [name] MRN: [id number] Date of Birth: 02-08-1946  Today's Date: 12/31/2015 OT Individual Time: 1000-1045 OT Individual Time Calculation (min): 45 min   1:1 GRAD Day 1:1 focus on family education with daughter on safe functional ambulation with RW, bed mobility, strageties for safety at home, recommendations for 24 hr supervision with all tasks, how to cue for functional problem solving and working memory, and sequencing, shower stall transfers (entering backwards with RW), safety with changing c collar (pads), and dynamic standing balance. Pt able to perform all tasks with supervision however still requires mod A for functional problem solving, supervision for emergent awareness to remain safe. Pt showered with supervision with mod cues for safety and throughness. Pt's daughter educated on change neck collar to philidephlia collar for showering supine in bed and then back into c collar afterwards. At very end of session [employer] came back to fit pt with new collar with extra pads- much better fit and comfort- [place] J size extra small.     Patient has met 10 of 10 long term goals due to improved activity tolerance, improved balance, postural control, ability to compensate for deficits, improved attention and improved awareness.  Patient to discharge at overall Supervision level including basic ADL tasks including showering.  Patient's care partner is independent to provide the necessary physical and cognitive assistance at discharge.  Currently, patient requires overall Min A for selective attention and awareness and Mod A for problem solving and recall of functional information.  Reasons goals not met: n/A  Recommendation:  Patient will benefit from ongoing skilled OT services in home health setting to continue to advance functional skills in the area of BADL, iADL and Reduce care partner burden.  Equipment: w/c Rw,  shower chair, hospital bed  Reasons for discharge: treatment goals met and discharge from hospital  Patient/family agrees with progress made and goals achieved: Yes  OT Discharge Precautions/Restrictions Precautions Precautions: Fall;Cervical Required Braces or Orthoses: Cervical Brace Cervical Brace: Hard collar;At all times Restrictions Weight Bearing Restrictions: No   Vital Signs Therapy Vitals Temp: 97.7 F (36.5 C) Temp Source: Oral Pulse Rate: (!) 108 Resp: 18 BP: (!) 102/56 Patient Position (if appropriate): Lying Oxygen Therapy SpO2: 95 % O2 Device: Not Delivered Pain Pain Assessment Pain Assessment: 0-10 Pain Score: 6  Pain Location: Neck Pain Descriptors / Indicators: Aching Pain Onset: On-going Patients Stated Pain Goal: 4 ADL ADL ADL Comments: see functional navigator Vision/Perception  Vision- History Baseline Vision/History: Wears glasses Wears Glasses: At all times Patient Visual Report: No change from baseline Vision- Assessment Vision Assessment?: No apparent visual deficits  Cognition Overall Cognitive Status: Impaired/Different from baseline Arousal/Alertness: Awake/alert Orientation Level: Oriented to person;Oriented to place;Oriented to time;Oriented to situation Attention: Selective Focused Attention: Appears intact Sustained Attention: Appears intact Selective Attention: Impaired Selective Attention Impairment: Functional basic Memory: Impaired Memory Impairment: Decreased recall of new information;Storage deficit;Decreased short term memory Awareness Impairment: Emergent impairment (at supervision ) Problem Solving: Impaired Problem Solving Impairment: Functional basic Executive Function: Sequencing;Organizing;Self Monitoring;Self Correcting Reasoning: Impaired Reasoning Impairment: Verbal complex;Functional complex Sequencing: Impaired Sequencing Impairment: Verbal complex;Functional basic Organizing: Impaired Organizing  Impairment: Functional complex;Functional basic Behaviors: Poor frustration tolerance;Impulsive Safety/Judgment: Impaired Sensation Sensation Light Touch: Appears Intact Stereognosis: Appears Intact Hot/Cold: Appears Intact Proprioception: Appears Intact Coordination Coordination and Movement Description: improved overall coordination but continues to be a fall risk Motor  Motor Motor - Discharge Observations: improved postural alignment and control- still posterior lean without  UE support Mobility  Bed Mobility Supine to Sit: 5: Supervision Sit to Supine: With rail;HOB elevated;5: Supervision Transfers Transfers: Sit to Stand;Stand to Sit Sit to Stand: 5: Supervision Stand to Sit: 5: Supervision  Trunk/Postural Assessment  Cervical Assessment Cervical Assessment:  (forward; c collar) Thoracic Assessment Thoracic Assessment: Exceptions to [facility] (rounded shoulders) Lumbar Assessment Lumbar Assessment:  (posterior pelvic tilt) Postural Control Postural Control:  (delayed reactions)  Balance Dynamic Sitting Balance Dynamic Sitting - Level of Assistance: 5: Stand by assistance Static Standing Balance Static Standing - Level of Assistance: 5: Stand by assistance Dynamic Standing Balance Dynamic Standing - Level of Assistance: 5: Stand by assistance Extremity/Trunk Assessment RUE Assessment RUE Assessment: Within Functional Limits LUE Assessment LUE Assessment: Within Functional Limits   See Function Navigator for Current Functional Status.  Willeen Cass [facility] 12/31/2015, 4:08 PM

## 2015-12-31 NOTE — Progress Notes (Signed)
Physical Therapy Discharge Summary  Patient Details  Name: [name] [name] MRN: [id number] Date of Birth: December 18, 1945  Today's Date: 12/31/2015 PT Individual Time: 1115-1200 PT Individual Time Calculation (min): 45 min     Patient has met 7 of 7 long term goals due to improved activity tolerance, improved balance, improved postural control, decreased pain, ability to compensate for deficits, functional use of  right upper extremity, right lower extremity, left upper extremity and left lower extremity, improved attention, improved awareness and improved coordination.  Patient to discharge at an ambulatory level Supervision.   Patient's care partner is independent to provide the necessary physical and cognitive assistance at discharge.  Reasons goals not met: All goals met  Recommendation:  Patient will benefit from ongoing skilled PT services in home health setting to continue to advance safe functional mobility, address ongoing impairments in balance, coordination, gait, ROM, and minimize fall risk.  Equipment: 18x18 Wheelchair, hospital bed  Reasons for discharge: treatment goals met and discharge from hospital  Patient/family agrees with progress made and goals achieved: Yes  PT Discharge Precautions/RestrictionsPrecautions Precautions: Fall;Cervical Required Braces or Orthoses: Cervical Brace Cervical Brace: Hard collar;At all times Restrictions Weight Bearing Restrictions: No Pain Pain Assessment Pain Assessment: 0-10 Pain Score: 6  Pain Location: Neck Pain Descriptors / Indicators: Aching Pain Onset: On-going Patients Stated Pain Goal: 4 Vision/Perception   WFL  Cognition Overall Cognitive Status: Impaired/Different from baseline Arousal/Alertness: Awake/alert Orientation Level: Oriented to person;Oriented to place;Oriented to time;Oriented to situation Attention: Selective Focused Attention: Appears intact Sustained Attention: Appears intact Selective Attention:  Impaired Selective Attention Impairment: Functional basic Memory: Impaired Memory Impairment: Decreased recall of new information;Storage deficit;Decreased short term memory Awareness Impairment: Emergent impairment (at supervision ) Problem Solving: Impaired Problem Solving Impairment: Functional basic Executive Function: Sequencing;Organizing;Self Monitoring;Self Correcting Reasoning: Impaired Reasoning Impairment: Verbal complex;Functional complex Sequencing: Impaired Sequencing Impairment: Verbal complex;Functional basic Organizing: Impaired Organizing Impairment: Functional complex;Functional basic Behaviors: Poor frustration tolerance;Impulsive Safety/Judgment: Impaired Sensation Sensation Light Touch: Appears Intact Stereognosis: Appears Intact Hot/Cold: Appears Intact Proprioception: Appears Intact Coordination Coordination and Movement Description: improved overall coordination but continues to be a fall risk Motor  Motor Motor - Discharge Observations: improved postural alignment and control- still posterior lean without UE support  Mobility Bed Mobility Bed Mobility: Supine to Sit;Sit to Supine Supine to Sit: 6: Modified independent (Device/Increase time) Sit to Supine: 6: Modified independent (Device/Increase time) Transfers Transfers: Yes Sit to Stand: 5: Supervision Sit to Stand Details: Verbal cues for technique;Verbal cues for precautions/safety;Tactile cues for posture Stand to Sit: 5: Supervision Stand Pivot Transfers: 5: Supervision Stand Pivot Transfer Details: Verbal cues for precautions/safety;Verbal cues for technique;Verbal cues for safe use of DME/AE Locomotion  Ambulation Ambulation: Yes Ambulation/Gait Assistance: 5: Supervision Ambulation Distance (Feet): 175 Feet Assistive device: Rolling walker Ambulation/Gait Assistance Details: Tactile cues for posture;Verbal cues for precautions/safety;Verbal cues for gait pattern;Verbal cues for  technique Ambulation/Gait Assistance Details: cues for increased step length, upright posture with forward gaze Gait Gait: Yes Gait Pattern: Impaired Gait Pattern: Decreased stride length;Shuffle;Right foot flat;Left foot flat;Poor foot clearance - left;Poor foot clearance - right;Trunk flexed;Lateral hip instability Gait velocity: 1.25 ft/sec Stairs / Additional Locomotion Stairs: Yes Stairs Assistance: 5: Supervision Stairs Assistance Details: Verbal cues for precautions/safety Stair Management Technique: One rail Right;Forwards;Step to pattern Number of Stairs: 12 Height of Stairs: 6 Ramp: 5: Supervision Curb: 5: Supervision Wheelchair Mobility Wheelchair Mobility: No  Trunk/Postural Assessment  Cervical Assessment Cervical Assessment:  (forward; c collar) Thoracic Assessment Thoracic Assessment: Exceptions to [facility] (  rounded shoulders) Lumbar Assessment Lumbar Assessment:  (posterior pelvic tilt) Postural Control Postural Control:  (delayed reactions)  Balance Dynamic Sitting Balance Dynamic Sitting - Level of Assistance: 5: Stand by assistance Static Standing Balance Static Standing - Level of Assistance: 5: Stand by assistance Dynamic Standing Balance Dynamic Standing - Level of Assistance: 5: Stand by assistance Extremity Assessment  RUE Assessment RUE Assessment: Within Functional Limits LUE Assessment LUE Assessment: Within Functional Limits RLE Assessment RLE Assessment: Within Functional Limits (4+/5 throughout) LLE Assessment LLE Assessment: Within Functional Limits (4+/5 throughout)  Skilled Therapeutic Intervention: Tx 1: Pt received seated in bed, family present for family education. Pt demonstrated all mobility as described above with S overall using RW. Performed Berg with results as above; continues to demonstrate high risk for falls. Educated family in recommendation that RW be used full time at home, even for short distances due to high falls risk. Pt  performed stairs with 1 handrail to simulate home environment; performed with S and slow speed. Returned to room and transferred sit >supine with modI. Remained supine in bed with family present and all needs in reach at end of session.   Tx 2: Pt received supine in bed, agreeable to treatment and pain as above unchanged from morning session. Gait to gym with RW and S x175'. Gait and dynamic standing balance with alternating toe taps with BLE wedges under forefoot for increased tibialis anterior activation and A with righting reactions with posterior LOB; noted subjective improvement in righting reactions however pt with increasing B shoulder pain as session progressed, decreased attention span and poor frustration tolerance. Pt requested to perform nustep; x7 min total with BLE level 6 for strengthening and aerobic endurance. Remained seated on mat table at end of session with instruction to wait for next therapist and request assist from other therapists in gym if needed; pt agreeable.   See Function Navigator for Current Functional Status.  Benjiman Core Tygielski 12/31/2015, 7:51 AM

## 2015-12-31 NOTE — Progress Notes (Signed)
Occupational Therapy Session Note  Patient Details  Name: Lasondra Rabbitt MRN: [id number] Date of Birth: 07-22-1945  Today's Date: 12/31/2015 OT Individual Time: 1345-1415 OT Individual Time Calculation (min): 30 min     Short Term Goals:Week 2:  OT Short Term Goal 1 (Week 2): STG=LTG  Skilled Therapeutic Interventions/Progress Updates:    OT session focusing on functional ambulation and ADL re-training. Pt received in gym with hand off from PT. Pt attempting to ambulate to "get glass of water while waiting" btwn therapy session. Other therapists in gym assisted pt to mat and obtained her water. Cont to educate pt on need for supervision for all standing/ walking needs, but not understanding why she could not stand up independently. Reviewed risk of fall and need for assist. She ambulated back to room using RW, mod VCs for longer step length.  She completed toileting tasks and transfers with supervision. VCs required for sequencing of washing hands as pt forgot to turn on water when washing.  She returned to EOB, light massage performed to B trapezius for comfort.  Pt returned to supine at end of session, positioned with K-pad and pillows for comfort.  Left in supine with all needs in reach, NT present. Reviewed use of call bell for assist.   Therapy Documentation Precautions:  Precautions Precautions: Fall, Cervical Required Braces or Orthoses: Cervical Brace Cervical Brace: Hard collar, At all times Restrictions Weight Bearing Restrictions: No Pain: Pain Assessment Pain Assessment: 0-10 Pain Score: 12  Pain Location: Neck Pain Descriptors / Indicators: Aching Pain Onset: On-going RN aware, repositioned, heat applied, distraction  See Function Navigator for Current Functional Status.   Therapy/Group: Individual Therapy  Lewis, Katelen Luepke C 12/31/2015, 7:03 AM

## 2015-12-31 NOTE — Progress Notes (Signed)
Social Work  Discharge Note  The overall goal for the admission was met for:   Discharge location: Yes - discharge home with daughter and son-in-law  Length of Stay: Yes - 12 days (with 10/28 d/c)  Discharge activity level: Yes - supervision  Home/community participation: Yes  Services provided included: MD, RD, PT, OT, SLP, RN, TR, Pharmacy, Neuropsych and SW  Financial Services: Medicare and Private Insurance: [employer]  Follow-up services arranged: Home Health: RN, PT, OT, ST via [facility], DME: 18x18 lightweigh w/c, basic cushion, tub bench, hospital bed all via [facility] and Patient/Family has no preference for HH/DME agencies  Comments (or additional information):  Patient/Family verbalized understanding of follow-up arrangements: Yes  Individual responsible for coordination of the follow-up plan: pt/ daughter  Confirmed correct DME delivered: ,  12/31/2015    ,  

## 2015-12-31 NOTE — Progress Notes (Signed)
[place] PHYSICAL MEDICINE & REHABILITATION     PROGRESS NOTE    Subjective/Complaints: Less confused. Recognized me. Having neck pain. Ready to get home. Mood a little depressed  ROS: Denies CP, SOB, N/V/D.  Objective: Vital Signs: Blood pressure (!) 116/54, pulse 83, temperature 98 F (36.7 C), temperature source Oral, resp. rate 18, height 5\' 6"  (1.676 m), weight 88.3 kg (194 lb 9.6 oz), SpO2 95 %. No results found. No results for input(s): WBC, HGB, HCT, PLT in the last 72 hours. No results for input(s): NA, K, CL, GLUCOSE, BUN, CREATININE, CALCIUM in the last 72 hours.  Invalid input(s): CO CBG (last 3)  No results for input(s): GLUCAP in the last 72 hours.  Wt Readings from Last 3 Encounters:  12/29/15 88.3 kg (194 lb 9.6 oz)  12/17/15 88.5 kg (195 lb)  11/24/15 89.4 kg (197 lb 3.2 oz)    Physical Exam:  Gen: Well developed. NAD.  HENT: Normocephalic. Atraumatic. Eyes: EOMare normal. No discharge.  Neck: C-collar  Cardiovascular: Normal rateand regular rhythm.  Respiratory: Effort normaland breath sounds normal. No respiratory distress.  GI: Soft. Bowel sounds are normal. She exhibits no distension.  Neurological: Alert and oriented. Motor: Remains grossly 4+/5 throughout Skin: Skin is warmand dry.  Psychiatric: Normal mood and affect.  Assessment/Plan: 1. Gait and functional deficits secondary to C5 Fx (hx of parkinson's disease) which require 3+ hours per day of interdisciplinary therapy in a comprehensive inpatient rehab setting. Physiatrist is providing close team supervision and 24 hour management of active medical problems listed below. Physiatrist and rehab team continue to assess barriers to discharge/monitor patient progress toward functional and medical goals.  Function:  Bathing Bathing position   Position: Shower  Bathing parts Body parts bathed by patient: Right arm, Left arm, Chest, Abdomen, Front perineal area, Buttocks, Right upper leg,  Left upper leg, Right lower leg, Left lower leg Body parts bathed by helper: Back  Bathing assist Assist Level: Supervision or verbal cues      Upper Body Dressing/Undressing Upper body dressing   What is the patient wearing?: Pull over shirt/dress     Pull over shirt/dress - Perfomed by patient: Thread/unthread right sleeve, Thread/unthread left sleeve, Put head through opening, Pull shirt over trunk Pull over shirt/dress - Perfomed by helper: Pull shirt over trunk Button up shirt - Perfomed by patient: Thread/unthread right sleeve, Thread/unthread left sleeve, Button/unbutton shirt Button up shirt - Perfomed by helper: Pull shirt around back    Upper body assist Assist Level: Set up      Lower Body Dressing/Undressing Lower body dressing   What is the patient wearing?: Pants Underwear - Performed by patient: Thread/unthread left underwear leg, Pull underwear up/down Underwear - Performed by helper: Thread/unthread right underwear leg Pants- Performed by patient: Thread/unthread right pants leg, Thread/unthread left pants leg, Pull pants up/down Pants- Performed by helper: Thread/unthread right pants leg, Thread/unthread left pants leg Non-skid slipper socks- Performed by patient: Don/doff left sock Non-skid slipper socks- Performed by helper: Don/doff right sock Socks - Performed by patient:  (able to doff not donn) Socks - Performed by helper: Don/doff right sock, Don/doff left sock Shoes - Performed by patient: Don/doff right shoe, Don/doff left shoe Shoes - Performed by helper: Don/doff right shoe, Don/doff left shoe       TED Hose - Performed by helper: Don/doff right TED hose, Don/doff left TED hose  Lower body assist Assist for lower body dressing: Touching or steadying assistance (Pt > 75%)  Toileting Toileting   Toileting steps completed by patient: Adjust clothing prior to toileting, Performs perineal hygiene, Adjust clothing after toileting Toileting steps  completed by helper: Adjust clothing prior to toileting, Performs perineal hygiene, Adjust clothing after toileting Toileting Assistive Devices: Grab bar or rail  Toileting assist Assist level: Supervision or verbal cues   Transfers Chair/bed transfer   Chair/bed transfer method: Ambulatory Chair/bed transfer assist level: Supervision or verbal cues Chair/bed transfer assistive device: Armrests, Medical sales representative     Max distance: 175 Assist level: Supervision or verbal cues   Wheelchair   Type: Manual Max wheelchair distance: 150 Assist Level: Supervision or verbal cues  Cognition Comprehension Comprehension assist level: Understands basic 75 - 89% of the time/ requires cueing 10 - 24% of the time  Expression Expression assist level: Expresses basic 50 - 74% of the time/requires cueing 25 - 49% of the time. Needs to repeat parts of sentences.  Social Interaction Social Interaction assist level: Interacts appropriately 75 - 89% of the time - Needs redirection for appropriate language or to initiate interaction.  Problem Solving Problem solving assist level: Solves basic 25 - 49% of the time - needs direction more than half the time to initiate, plan or complete simple activities  Memory Memory assist level: Recognizes or recalls 50 - 74% of the time/requires cueing 25 - 49% of the time   Medical Problem List and Plan: 1.  Debilitation secondary to C5 fracture after fall as well as history of Parkinson's disease. Cervical collar at all times             -continue PT, OT, SLP  -working on [employer]  -supervision recommended at home 2.  DVT Prophylaxis/Anticoagulation: Chronic Coumadin for history of pulmonary emboli/hypercoagulable state. Monitor for any bleeding episodes  INR therapeutic 3. Pain Management: Oxycodone as needed, hx fibromyalgia  -robaxin PRN  - removal of scheduled percocet and fentanyl seems to have helped.  -continue modalities/alternate means  of pain control. 4. Mood: Cymbalta 60 mg daily  -appreciate neuropsych input  Pt feels that any depression in her mood has been related to increased pain. Feels better emotionally with better pain control. 5. Neuropsych: This patient is capable of making decisions on her own behalf. 6. Skin/Wound Care: Routine skin checks 7. Fluids/Electrolytes/Nutrition:    8. Parkinson disease. Sinemet 25-100 3 times a day. Followed by Dr. [name]  9. Hypertension. HCTZ 12.5 mg daily, Diovan 160 mg daily. Monitor with increased mobility 10. Hypothyroidism. Synthroid. TSH 0.61 11. Hyperlipidemia. Lipitor, Lovaza 12. Hypokalemia  Supplemented  Labs reviewed , normal 13.  MS changes UA neg, likely narcotic related, see above  -ucx multispecies---recollection pending  LOS (Days) 11 A FACE TO FACE EVALUATION WAS PERFORMED  Ranae Casebier T 12/31/2015 9:10 AM

## 2016-01-01 NOTE — Progress Notes (Signed)
Patient discharged to home with daughter about 11. Patient discharged with all belongings and discharge instructions given via Silvestre Mesi PA yesterday. Patient denied any questions about discharge instructions .

## 2016-01-01 NOTE — Progress Notes (Signed)
[place] PHYSICAL MEDICINE & REHABILITATION     PROGRESS NOTE    Subjective/Complaints: Alert sitting in bed. Excited to go home. Pain reasonable ROS: Denies CP, SOB, N/V/D.  Objective: Vital Signs: Blood pressure (!) 112/48, pulse 86, temperature 97.6 F (36.4 C), temperature source Oral, resp. rate 18, height '5\' 6"'  (1.676 m), weight 88.3 kg (194 lb 9.6 oz), SpO2 96 %. No results found. No results for input(s): WBC, HGB, HCT, PLT in the last 72 hours. No results for input(s): NA, K, CL, GLUCOSE, BUN, CREATININE, CALCIUM in the last 72 hours.  Invalid input(s): CO CBG (last 3)  No results for input(s): GLUCAP in the last 72 hours.  Wt Readings from Last 3 Encounters:  12/29/15 88.3 kg (194 lb 9.6 oz)  12/17/15 88.5 kg (195 lb)  11/24/15 89.4 kg (197 lb 3.2 oz)    Physical Exam:  Gen: Well developed. NAD.  HENT: Normocephalic. Atraumatic. Eyes: EOMare normal. No discharge.  Neck: C-collar  Cardiovascular: Normal rateand regular rhythm.  Respiratory: Effort normaland breath sounds normal. No respiratory distress.  GI: Soft. Bowel sounds are normal. She exhibits no distension.  Neurological: Alert and oriented. Motor: Remains grossly 4+/5 throughout Skin: Skin is warmand dry.  Psychiatric: Normal mood and affect.  Assessment/Plan: 1. Gait and functional deficits secondary to C5 Fx (hx of parkinson's disease) which require 3+ hours per day of interdisciplinary therapy in a comprehensive inpatient rehab setting. Physiatrist is providing close team supervision and 24 hour management of active medical problems listed below. Physiatrist and rehab team continue to assess barriers to discharge/monitor patient progress toward functional and medical goals.  Function:  Bathing Bathing position   Position: Shower  Bathing parts Body parts bathed by patient: Right arm, Left arm, Chest, Abdomen, Front perineal area, Buttocks, Right upper leg, Left upper leg, Right lower leg,  Left lower leg Body parts bathed by helper: Back  Bathing assist Assist Level: Supervision or verbal cues      Upper Body Dressing/Undressing Upper body dressing   What is the patient wearing?: Pull over shirt/dress, Bra Bra - Perfomed by patient: Thread/unthread right bra strap, Thread/unthread left bra strap, Hook/unhook bra (pull down sports bra)   Pull over shirt/dress - Perfomed by patient: Thread/unthread right sleeve, Thread/unthread left sleeve, Put head through opening, Pull shirt over trunk Pull over shirt/dress - Perfomed by helper: Pull shirt over trunk Button up shirt - Perfomed by patient: Thread/unthread right sleeve, Thread/unthread left sleeve, Button/unbutton shirt Button up shirt - Perfomed by helper: Pull shirt around back    Upper body assist Assist Level: Supervision or verbal cues      Lower Body Dressing/Undressing Lower body dressing   What is the patient wearing?: Pants, Underwear, Socks, Shoes Underwear - Performed by patient: Thread/unthread right underwear leg, Thread/unthread left underwear leg, Pull underwear up/down Underwear - Performed by helper: Thread/unthread right underwear leg Pants- Performed by patient: Thread/unthread right pants leg, Thread/unthread left pants leg, Pull pants up/down Pants- Performed by helper: Thread/unthread right pants leg, Thread/unthread left pants leg Non-skid slipper socks- Performed by patient: Don/doff right sock, Don/doff left sock Non-skid slipper socks- Performed by helper: Don/doff right sock Socks - Performed by patient:  (able to doff not donn) Socks - Performed by helper: Don/doff right sock, Don/doff left sock Shoes - Performed by patient: Don/doff right shoe, Don/doff left shoe Shoes - Performed by helper: Don/doff right shoe, Don/doff left shoe       TED Hose - Performed by helper: Don/doff  right TED hose, Don/doff left TED hose  Lower body assist Assist for lower body dressing: Supervision or verbal  cues, Set up      [place] steps completed by patient: Adjust clothing prior to toileting, Performs perineal hygiene, Adjust clothing after toileting Toileting steps completed by helper: Adjust clothing prior to toileting, Performs perineal hygiene, Adjust clothing after toileting Toileting Assistive Devices: Grab bar or rail  Toileting assist Assist level: Set up/obtain supplies   Transfers Chair/bed transfer   Chair/bed transfer method: Ambulatory Chair/bed transfer assist level: Supervision or verbal cues Chair/bed transfer assistive device: Armrests, Medical sales representative     Max distance: 175 Assist level: Supervision or verbal cues   Wheelchair   Type: Manual Max wheelchair distance: 150 Assist Level: Supervision or verbal cues  Cognition Comprehension Comprehension assist level: Understands basic 75 - 89% of the time/ requires cueing 10 - 24% of the time  Expression Expression assist level: Expresses basic needs/ideas: With extra time/assistive device  Social Interaction Social Interaction assist level: Interacts appropriately with others with medication or extra time (anti-anxiety, antidepressant).  Problem Solving Problem solving assist level: Solves basic 50 - 74% of the time/requires cueing 25 - 49% of the time  Memory Memory assist level: Recognizes or recalls 50 - 74% of the time/requires cueing 25 - 49% of the time   Medical Problem List and Plan: 1.  Debilitation secondary to C5 fracture after fall as well as history of Parkinson's disease. Cervical collar at all times             -home today with supervision.    -goals met 2.  DVT Prophylaxis/Anticoagulation: Chronic Coumadin for history of pulmonary emboli/hypercoagulable state. Monitor for any bleeding episodes  INR therapeutic 3. Pain Management: Oxycodone as needed, hx fibromyalgia  -robaxin PRN  -utilize modalities/alternate means of pain control. 4. Mood: Cymbalta 60  mg daily  -appreciate neuropsych input  -ongoing observation at home. 5. Neuropsych: This patient is capable of making decisions on her own behalf. 6. Skin/Wound Care: Routine skin checks 7. Fluids/Electrolytes/Nutrition:    8. Parkinson disease. Sinemet 25-100 3 times a day. Followed by Dr. [name]  9. Hypertension. HCTZ 12.5 mg daily, Diovan 160 mg daily. Monitor with increased mobility 10. Hypothyroidism. Synthroid. TSH 0.61 11. Hyperlipidemia. Lipitor, Lovaza 12. Hypokalemia  Supplemented  Labs reviewed , normal 13.  MS changes UA neg, likely narcotic related, see above  -ucx multispecies---recollection with normal flora  LOS (Days) 12 A FACE TO FACE EVALUATION WAS PERFORMED  SWARTZ,ZACHARY T 01/01/2016 9:56 AM

## 2016-01-02 DIAGNOSIS — S12400D Unspecified displaced fracture of fifth cervical vertebra, subsequent encounter for fracture with routine healing: Secondary | ICD-10-CM | POA: Diagnosis not present

## 2016-01-02 DIAGNOSIS — G20 Parkinson's disease: Secondary | ICD-10-CM | POA: Diagnosis not present

## 2016-01-02 DIAGNOSIS — S069X0D Unspecified intracranial injury without loss of consciousness, subsequent encounter: Secondary | ICD-10-CM | POA: Diagnosis not present

## 2016-01-02 DIAGNOSIS — I10 Essential (primary) hypertension: Secondary | ICD-10-CM | POA: Diagnosis not present

## 2016-01-02 DIAGNOSIS — M797 Fibromyalgia: Secondary | ICD-10-CM | POA: Diagnosis not present

## 2016-01-02 DIAGNOSIS — W19XXXD Unspecified fall, subsequent encounter: Secondary | ICD-10-CM | POA: Diagnosis not present

## 2016-01-03 ENCOUNTER — Telehealth: Payer: Self-pay | Admitting: *Deleted

## 2016-01-03 ENCOUNTER — Telehealth: Payer: Self-pay | Admitting: Neurology

## 2016-01-03 NOTE — Telephone Encounter (Signed)
Left message on machine for patient to call back.  To see what dose of Rytary she is on.

## 2016-01-03 NOTE — Telephone Encounter (Signed)
Called patient and left message to return call

## 2016-01-03 NOTE — Telephone Encounter (Signed)
Pt would like samples of Rytary. Please call.

## 2016-01-04 DIAGNOSIS — W19XXXD Unspecified fall, subsequent encounter: Secondary | ICD-10-CM | POA: Diagnosis not present

## 2016-01-04 DIAGNOSIS — G20 Parkinson's disease: Secondary | ICD-10-CM | POA: Diagnosis not present

## 2016-01-04 DIAGNOSIS — S069X0D Unspecified intracranial injury without loss of consciousness, subsequent encounter: Secondary | ICD-10-CM | POA: Diagnosis not present

## 2016-01-04 DIAGNOSIS — I10 Essential (primary) hypertension: Secondary | ICD-10-CM | POA: Diagnosis not present

## 2016-01-04 DIAGNOSIS — S12400D Unspecified displaced fracture of fifth cervical vertebra, subsequent encounter for fracture with routine healing: Secondary | ICD-10-CM | POA: Diagnosis not present

## 2016-01-04 DIAGNOSIS — M797 Fibromyalgia: Secondary | ICD-10-CM | POA: Diagnosis not present

## 2016-01-04 NOTE — Telephone Encounter (Signed)
Attempted to reach patient for TCM/Hospital Follow-up call. Left message for patient to return call when available.    

## 2016-01-05 ENCOUNTER — Telehealth: Payer: Self-pay | Admitting: Family Medicine

## 2016-01-05 ENCOUNTER — Encounter: Payer: Medicare Other | Admitting: Behavioral Health

## 2016-01-05 DIAGNOSIS — S069X0D Unspecified intracranial injury without loss of consciousness, subsequent encounter: Secondary | ICD-10-CM | POA: Diagnosis not present

## 2016-01-05 DIAGNOSIS — S12400D Unspecified displaced fracture of fifth cervical vertebra, subsequent encounter for fracture with routine healing: Secondary | ICD-10-CM | POA: Diagnosis not present

## 2016-01-05 DIAGNOSIS — G20 Parkinson's disease: Secondary | ICD-10-CM | POA: Diagnosis not present

## 2016-01-05 DIAGNOSIS — M797 Fibromyalgia: Secondary | ICD-10-CM | POA: Diagnosis not present

## 2016-01-05 DIAGNOSIS — W19XXXD Unspecified fall, subsequent encounter: Secondary | ICD-10-CM | POA: Diagnosis not present

## 2016-01-05 DIAGNOSIS — I10 Essential (primary) hypertension: Secondary | ICD-10-CM | POA: Diagnosis not present

## 2016-01-05 NOTE — Telephone Encounter (Signed)
[facility]   INR results - 1.7 PT 20.2    CB: [phone]

## 2016-01-05 NOTE — Progress Notes (Signed)
Need INR today

## 2016-01-05 NOTE — Telephone Encounter (Signed)
Caller name: Jenifer Relationship to patient: [facility] Can be reached: [place]:  Reason for call: Need verbal order for Physical Therapy 2 times a week for 7 weeks. ALSO: States yesterday patient was complaining of severe neck pain. Stated it was a 10 out of 10 and her pulse was 108 resting.

## 2016-01-05 NOTE — Telephone Encounter (Signed)
[name] [name] and gave order for PT.  I last saw this pt in September- since then she was admitted from 10/16 to 10/28 whit a fracture of C5 following a fall.  She also has parkinsons and chronic pain due to FBM.  Called pt and LMOM- let me know if I can help.  Called [name] who states that her mom has pain that she will characterize as a 10/10 most of the time, and that she has not noted any change in her condition or cause for alarm.  She is seeing me next week  She is taking 3 mg of coumadin daily- most recent INR a bit low, will need to adjust.  Will call her home health nurse

## 2016-01-05 NOTE — Telephone Encounter (Signed)
She is taking 3mg  daily = 21 mg a week.  Will have her increase to 4.5 mg twice a week = 24 mg.  Called Pam- will have her take 4.5 mg on tues/ Thursday and 3 mg the other days of the week.  Will see me Monday and we can do an INR then

## 2016-01-06 ENCOUNTER — Telehealth: Payer: Self-pay | Admitting: Behavioral Health

## 2016-01-06 NOTE — Telephone Encounter (Signed)
Per Telephone Note 01/05/16, the patient will have INR rechecked at next office visit on 01/10/16.

## 2016-01-07 ENCOUNTER — Telehealth: Payer: Self-pay | Admitting: Family Medicine

## 2016-01-07 DIAGNOSIS — W19XXXD Unspecified fall, subsequent encounter: Secondary | ICD-10-CM | POA: Diagnosis not present

## 2016-01-07 DIAGNOSIS — S12400D Unspecified displaced fracture of fifth cervical vertebra, subsequent encounter for fracture with routine healing: Secondary | ICD-10-CM | POA: Diagnosis not present

## 2016-01-07 DIAGNOSIS — M797 Fibromyalgia: Secondary | ICD-10-CM | POA: Diagnosis not present

## 2016-01-07 DIAGNOSIS — I10 Essential (primary) hypertension: Secondary | ICD-10-CM | POA: Diagnosis not present

## 2016-01-07 DIAGNOSIS — S069X0D Unspecified intracranial injury without loss of consciousness, subsequent encounter: Secondary | ICD-10-CM | POA: Diagnosis not present

## 2016-01-07 DIAGNOSIS — G20 Parkinson's disease: Secondary | ICD-10-CM | POA: Diagnosis not present

## 2016-01-07 NOTE — Telephone Encounter (Signed)
Patient Name: NUHA VALDIVIESO  DOB: Sep 07, 1945    Initial Comment Caller states, Sandy -(OT) with [facility] - with patient - elevated heart 96 and blood pressure 124/90 pulse- headaches and dizziness. [place] [phone]   Nurse Assessment  Nurse: Raphael Gibney, RN, Vanita Ingles Date/Time Eilene Ghazi Time): 01/07/2016 11:07:48 AM  Confirm and document reason for call. If symptomatic, describe symptoms. You must click the next button to save text entered. ---Caller states she is OT with home health. Pt was complaining of headache and dizziness. Pulse 96 when OT was there. BP 124/90 today. Earlier this week, BP was 104/60. Pulse rate was as high as as 108. Daughter states she is more incontinent of urine than usual. Daughter states she seems to be more confused than usual. Daughter said they are looking to have her placed in inpt rehab. She is at her daughter's home. Called daughter at her home and daughter states she is more confused than usual. Had CT scan in the hospital. she is more incontinent than usual. Urine has a strong odor.  Has the patient traveled out of the country within the last 30 days? ---No  Does the patient have any new or worsening symptoms? ---Yes  Will a triage be completed? ---Yes  Related visit to physician within the last 2 weeks? ---No  Does the PT have any chronic conditions? (i.e. diabetes, asthma, etc.) ---Yes  List chronic conditions. ---parkinson's; fell down stairs and broke her neck last month.  Is this a behavioral health or substance abuse call? ---No     Guidelines    Guideline Title Affirmed Question Affirmed Notes  Dizziness - Lightheadedness [1] MODERATE dizziness (e.g., interferes with normal activities) AND [2] has NOT been evaluated by physician for this (Exception: dizziness caused by heat exposure, sudden standing, or poor fluid intake)    Final Disposition User   See Physician within 24 Hours Bricelyn, RN, Vera    Comments  left message on [employer]. Also  pulled up pt's chart in EPIC and called pt's numbers listed.  pt is at her daughter's home [place]; phone number: [phone]. Will try her daughter.  Daughter states pt has appt on Monday with Dr. Edilia Bo. It is difficult to get her to the office. Please call daughter Beacher May back at [phone] if it is necessary for her to be evaluated sooner than Monday.   Referrals  GO TO FACILITY REFUSED   Disagree/Comply: Disagree  Disagree/Comply Reason: Disagree with instructions

## 2016-01-07 NOTE — Telephone Encounter (Signed)
Lab Results  Component Value Date   INR 2.68 12/31/2015   INR 2.63 12/29/2015   INR 2.27 12/27/2015   PROTIME 32.4 (H) 02/14/2011   PROTIME 36.0 (H) 07/26/2010

## 2016-01-07 NOTE — Telephone Encounter (Signed)
[facility] PT nurse called stating patient was having elevated pulse and BP, call transferred to [facility]

## 2016-01-07 NOTE — NC FL2 (Signed)
[place] LEVEL OF CARE SCREENING TOOL     IDENTIFICATION  Patient Name: [name] [name] Birthdate: [birth year] Sex: female Admission Date (Current Location): 12/20/2015  [facility] and [place] Number:  Herbalist and Address:   (private residence)      Provider Number: [id number]  Attending Physician Name and Address:  No att. providers found  Dr. Delice Lesch, MD  Relative Name and Phone Number:       Current Level of Care: Home Recommended Level of Care: [place] Prior Approval Number:    Date Approved/Denied:   PASRR Number: [id number]  Discharge Plan:  (Pt is at home, but not doing well and needs SNF care.)    Current Diagnoses: Patient Active Problem List   Diagnosis Date Noted  . Benign essential HTN   . Intractable pain 12/17/2015  . Displaced fracture of fifth cervical vertebra ([place]) 12/17/2015  . Gait disturbance 12/17/2015  . Trochanteric bursitis of both hips 09/13/2015  . Chronic low back pain 06/07/2015  . Fibromyalgia syndrome 05/17/2015  . Hypothyroidism 05/17/2015  . Parkinson's disease ([place]) 05/17/2015  . Hypercoagulable state ([place]) 02/07/2012  . History of pulmonary embolism 02/07/2012  . S/P right TKA 05/22/2011    Orientation RESPIRATION BLADDER Height & Weight     Self, Time, Situation, Place  Normal Incontinent Weight: 88.3 kg (194 lb 9.6 oz) Height:  5\' 6"  (167.6 cm)  BEHAVIORAL SYMPTOMS/MOOD NEUROLOGICAL BOWEL NUTRITION STATUS      Continent Diet (low sodium - heart healthy)  AMBULATORY STATUS COMMUNICATION OF NEEDS Skin   Limited Assist (min assist stairs and community ambulation; supervision household ambulation) Verbally Skin abrasions, Bruising (due to neck brace)                       Personal Care Assistance Level of Assistance  Bathing, Dressing Bathing Assistance: Limited assistance   Dressing Assistance: Limited assistance     Functional Limitations Info  Hearing   Hearing  Info: Impaired (hard of healing)      SPECIAL CARE FACTORS FREQUENCY  PT (By licensed PT), OT (By licensed OT), Bowel and bladder program, Speech therapy (on coumadin - INR checks)     PT Frequency: 5x/week OT Frequency: 5x/week Bowel and Bladder Program Frequency: daily   Speech Therapy Frequency: 3-5x/week      Contractures Contractures Info: Not present    Additional Factors Info  Allergies   Allergies Info: Crestor, Erythromycin, Gabapentin, Pregabalin, Carbidopa, Macrobid, Losartan,            Current Medications (01/07/2016):  This is the current hospital active medication list No current facility-administered medications for this encounter.    Current Outpatient Prescriptions  Medication Sig Dispense Refill  . acetaminophen (TYLENOL) 500 MG tablet Take 500 mg by mouth every 6 (six) hours as needed for mild pain.    Marland Kitchen aspirin 81 MG tablet Take 81 mg by mouth daily after breakfast.     . azelastine (ASTELIN) 0.1 % nasal spray Place 1 spray into both nostrils 2 (two) times daily. Use in each nostril as directed    . Calcium Carbonate-Vitamin D (CALCIUM + D PO) Take 2 tablets by mouth at bedtime. 600-200 mg tab.    . Carbidopa-Levodopa ER (RYTARY) 23.75-95 MG CPCR Take 1 tablet by mouth 3 (three) times daily. 100 capsule 0  . Cholecalciferol (VITAMIN D3) 5000 UNITS CAPS Take 5,000 Units by mouth daily.     . cycloSPORINE (RESTASIS) 0.05 %  ophthalmic emulsion Place 1 drop into both eyes daily as needed.    . Magnesium 250 MG TABS Take 1 tablet by mouth at bedtime.     . magnesium oxide (MAG-OX) 400 (241.3 Mg) MG tablet Take 0.5 tablets (200 mg total) by mouth daily. 30 tablet 0  . Multiple Vitamin (MULTIVITAMIN) tablet Take 1 tablet by mouth daily.     . Omega-3 Fatty Acids (FISH OIL) 1200 MG CAPS Take 2 capsules by mouth at bedtime.     Marland Kitchen telmisartan-hydrochlorothiazide (MICARDIS HCT) 40-12.5 MG per tablet Take 1 tablet by mouth daily.   1  . vitamin B-12 (CYANOCOBALAMIN)  1000 MCG tablet Take 1,000 mcg by mouth daily.    Marland Kitchen zolpidem (AMBIEN) 10 MG tablet Take 1 tablet (10 mg total) by mouth at bedtime. 30 tablet 2  . atorvastatin (LIPITOR) 10 MG tablet Take 1 tablet (10 mg total) by mouth daily. 30 tablet 1  . celecoxib (CELEBREX) 100 MG capsule Take 1 capsule (100 mg total) by mouth 2 (two) times daily. 60 capsule 1  . DULoxetine (CYMBALTA) 60 MG capsule Take 1 capsule (60 mg total) by mouth daily. 30 capsule 3  . DULoxetine (CYMBALTA) 60 MG capsule Take 1 capsule (60 mg total) by mouth daily. 30 capsule 5  . DULoxetine (CYMBALTA) 60 MG capsule Take 1 capsule (60 mg total) by mouth daily. 90 capsule 0  . guaiFENesin (MUCINEX) 600 MG 12 hr tablet Take 1 tablet (600 mg total) by mouth 2 (two) times daily. 60 tablet 0  . methocarbamol (ROBAXIN) 500 MG tablet Take 1 tablet (500 mg total) by mouth 4 (four) times daily as needed for muscle spasms. 60 tablet 0  . oxyCODONE 10 MG TABS Take 1-1.5 tablets (10-15 mg total) by mouth every 4 (four) hours as needed for moderate pain. 20 tablet 0  . potassium chloride SA (K-DUR,KLOR-CON) 20 MEQ tablet Take 1 tablet (20 mEq total) by mouth daily. 30 tablet 0  . SYNTHROID 150 MCG tablet Take 1 tablet (150 mcg total) by mouth daily before breakfast. 30 tablet 1  . warfarin (COUMADIN) 3 MG tablet Take 1 tablet (3 mg total) by mouth daily at 6 PM. 30 tablet 0     Discharge Medications: Please see discharge summary for a list of discharge medications.  Relevant Imaging Results:  Relevant Lab Results:   Additional Information SSN:  [id number]  Raydan Schlabach, Silvestre Mesi, LCSW

## 2016-01-07 NOTE — NC FL2 (Deleted)
[place] LEVEL OF CARE SCREENING TOOL     IDENTIFICATION  Patient Name: [name] [name] Birthdate: [birth year] Sex: female Admission Date (Current Location): 12/20/2015  [facility] and [place] Number:  Herbalist and Address:   (private residence)      Provider Number: [id number]  Attending Physician Name and Address:  No att. providers found Dr. Alger Simons, MD Relative Name and Phone Number:       Current Level of Care: Home Recommended Level of Care: [place] Prior Approval Number:    Date Approved/Denied:   PASRR Number:  (submitted)  Discharge Plan:  (Pt is at home, but not doing well and needs SNF care.)    Current Diagnoses: Patient Active Problem List   Diagnosis Date Noted  . Benign essential HTN   . Intractable pain 12/17/2015  . Displaced fracture of fifth cervical vertebra ([place]) 12/17/2015  . Gait disturbance 12/17/2015  . Trochanteric bursitis of both hips 09/13/2015  . Chronic low back pain 06/07/2015  . Fibromyalgia syndrome 05/17/2015  . Hypothyroidism 05/17/2015  . Parkinson's disease ([place]) 05/17/2015  . Hypercoagulable state ([place]) 02/07/2012  . History of pulmonary embolism 02/07/2012  . S/P right TKA 05/22/2011    Orientation RESPIRATION BLADDER Height & Weight     Self, Time, Situation, Place  Normal Incontinent Weight: 88.3 kg (194 lb 9.6 oz) Height:  5\' 6"  (167.6 cm)  BEHAVIORAL SYMPTOMS/MOOD NEUROLOGICAL BOWEL NUTRITION STATUS      Continent Diet (low sodium - heart healthy)  AMBULATORY STATUS COMMUNICATION OF NEEDS Skin   Limited Assist (min assist stairs and community ambulation; supervision household ambulation) Verbally Skin abrasions, Bruising (due to neck brace)                       Personal Care Assistance Level of Assistance  Bathing, Dressing Bathing Assistance: Limited assistance   Dressing Assistance: Limited assistance     Functional Limitations Info  Hearing   Hearing  Info: Impaired (hard of hearing)      SPECIAL CARE FACTORS FREQUENCY  PT (By licensed PT), OT (By licensed OT), Bowel and bladder program, Speech therapy (on coumadin - INR checks)     PT Frequency: 5x/week OT Frequency: 5x/week Bowel and Bladder Program Frequency: daily   Speech Therapy Frequency: 3-5x/week      Contractures Contractures Info: Not present    Additional Factors Info  Allergies   Allergies Info: Crestor, Erythromycin, Gabapentin, Pregabalin, Carbidopa, Macrobid, Losartan,            Current Medications (01/07/2016):  This is the current hospital active medication list No current facility-administered medications for this encounter.    Current Outpatient Prescriptions  Medication Sig Dispense Refill  . acetaminophen (TYLENOL) 500 MG tablet Take 500 mg by mouth every 6 (six) hours as needed for mild pain.    Marland Kitchen aspirin 81 MG tablet Take 81 mg by mouth daily after breakfast.     . azelastine (ASTELIN) 0.1 % nasal spray Place 1 spray into both nostrils 2 (two) times daily. Use in each nostril as directed    . Calcium Carbonate-Vitamin D (CALCIUM + D PO) Take 2 tablets by mouth at bedtime. 600-200 mg tab.    . Carbidopa-Levodopa ER (RYTARY) 23.75-95 MG CPCR Take 1 tablet by mouth 3 (three) times daily. 100 capsule 0  . Cholecalciferol (VITAMIN D3) 5000 UNITS CAPS Take 5,000 Units by mouth daily.     . cycloSPORINE (RESTASIS) 0.05 % ophthalmic  emulsion Place 1 drop into both eyes daily as needed.    . Magnesium 250 MG TABS Take 1 tablet by mouth at bedtime.     . magnesium oxide (MAG-OX) 400 (241.3 Mg) MG tablet Take 0.5 tablets (200 mg total) by mouth daily. 30 tablet 0  . Multiple Vitamin (MULTIVITAMIN) tablet Take 1 tablet by mouth daily.     . Omega-3 Fatty Acids (FISH OIL) 1200 MG CAPS Take 2 capsules by mouth at bedtime.     Marland Kitchen telmisartan-hydrochlorothiazide (MICARDIS HCT) 40-12.5 MG per tablet Take 1 tablet by mouth daily.   1  . vitamin B-12 (CYANOCOBALAMIN)  1000 MCG tablet Take 1,000 mcg by mouth daily.    Marland Kitchen zolpidem (AMBIEN) 10 MG tablet Take 1 tablet (10 mg total) by mouth at bedtime. 30 tablet 2  . atorvastatin (LIPITOR) 10 MG tablet Take 1 tablet (10 mg total) by mouth daily. 30 tablet 1  . celecoxib (CELEBREX) 100 MG capsule Take 1 capsule (100 mg total) by mouth 2 (two) times daily. 60 capsule 1  . DULoxetine (CYMBALTA) 60 MG capsule Take 1 capsule (60 mg total) by mouth daily. 30 capsule 3  . DULoxetine (CYMBALTA) 60 MG capsule Take 1 capsule (60 mg total) by mouth daily. 30 capsule 5  . DULoxetine (CYMBALTA) 60 MG capsule Take 1 capsule (60 mg total) by mouth daily. 90 capsule 0  . guaiFENesin (MUCINEX) 600 MG 12 hr tablet Take 1 tablet (600 mg total) by mouth 2 (two) times daily. 60 tablet 0  . methocarbamol (ROBAXIN) 500 MG tablet Take 1 tablet (500 mg total) by mouth 4 (four) times daily as needed for muscle spasms. 60 tablet 0  . oxyCODONE 10 MG TABS Take 1-1.5 tablets (10-15 mg total) by mouth every 4 (four) hours as needed for moderate pain. 20 tablet 0  . potassium chloride SA (K-DUR,KLOR-CON) 20 MEQ tablet Take 1 tablet (20 mEq total) by mouth daily. 30 tablet 0  . SYNTHROID 150 MCG tablet Take 1 tablet (150 mcg total) by mouth daily before breakfast. 30 tablet 1  . warfarin (COUMADIN) 3 MG tablet Take 1 tablet (3 mg total) by mouth daily at 6 PM. 30 tablet 0     Discharge Medications: Please see discharge summary for a list of discharge medications.  Relevant Imaging Results:  Relevant Lab Results:   Additional Information SSN:  [id number]  Jalia Zuniga, Silvestre Mesi, LCSW

## 2016-01-10 ENCOUNTER — Telehealth: Payer: Self-pay | Admitting: Family Medicine

## 2016-01-10 ENCOUNTER — Ambulatory Visit (INDEPENDENT_AMBULATORY_CARE_PROVIDER_SITE_OTHER): Payer: Medicare Other | Admitting: Family Medicine

## 2016-01-10 VITALS — BP 128/76 | HR 114 | Temp 97.6°F | Ht 66.0 in | Wt 186.8 lb

## 2016-01-10 DIAGNOSIS — E034 Atrophy of thyroid (acquired): Secondary | ICD-10-CM | POA: Diagnosis not present

## 2016-01-10 DIAGNOSIS — Z86711 Personal history of pulmonary embolism: Secondary | ICD-10-CM | POA: Diagnosis not present

## 2016-01-10 DIAGNOSIS — N3946 Mixed incontinence: Secondary | ICD-10-CM | POA: Diagnosis not present

## 2016-01-10 DIAGNOSIS — S12401D Unspecified nondisplaced fracture of fifth cervical vertebra, subsequent encounter for fracture with routine healing: Secondary | ICD-10-CM | POA: Diagnosis not present

## 2016-01-10 DIAGNOSIS — R4182 Altered mental status, unspecified: Secondary | ICD-10-CM

## 2016-01-10 DIAGNOSIS — I749 Embolism and thrombosis of unspecified artery: Secondary | ICD-10-CM | POA: Diagnosis not present

## 2016-01-10 LAB — CBC
HEMATOCRIT: 44.7 % (ref 36.0–46.0)
HEMOGLOBIN: 14.9 g/dL (ref 12.0–15.0)
MCHC: 33.3 g/dL (ref 30.0–36.0)
MCV: 87.8 fl (ref 78.0–100.0)
Platelets: 437 10*3/uL — ABNORMAL HIGH (ref 150.0–400.0)
RBC: 5.09 Mil/uL (ref 3.87–5.11)
RDW: 14.2 % (ref 11.5–15.5)
WBC: 10.8 10*3/uL — ABNORMAL HIGH (ref 4.0–10.5)

## 2016-01-10 LAB — COMPREHENSIVE METABOLIC PANEL
ALK PHOS: 156 U/L — AB (ref 39–117)
ALT: 14 U/L (ref 0–35)
AST: 27 U/L (ref 0–37)
Albumin: 4.2 g/dL (ref 3.5–5.2)
BUN: 15 mg/dL (ref 6–23)
CO2: 27 meq/L (ref 19–32)
Calcium: 10.3 mg/dL (ref 8.4–10.5)
Chloride: 103 mEq/L (ref 96–112)
Creatinine, Ser: 0.77 mg/dL (ref 0.40–1.20)
GFR: 78.13 mL/min (ref >60.00)
GLUCOSE: 123 mg/dL — AB (ref 70–99)
POTASSIUM: 3.7 meq/L (ref 3.5–5.1)
Sodium: 140 mEq/L (ref 135–145)
Total Bilirubin: 0.8 mg/dL (ref 0.2–1.2)
Total Protein: 7.3 g/dL (ref 6.0–8.3)

## 2016-01-10 LAB — TSH: TSH: 0.62 u[IU]/mL (ref 0.35–4.50)

## 2016-01-10 LAB — POCT INR: INR: 2

## 2016-01-10 MED ORDER — CEPHALEXIN 500 MG PO CAPS: 500.0000 mg | ORAL_CAPSULE | Freq: Two times a day (BID) | ORAL | 0 refills | Status: DC

## 2016-01-10 NOTE — Progress Notes (Signed)
[facility] [address], [place], [place] [address] [phone]  Date:  01/10/2016   Name:  [name] [name]   DOB:  03-10-1945   MRN:  [id number]  PCP:  Lamar Blinks, MD    Chief Complaint: Hospitalization Follow-up   History of Present Illness:  [name] [name] is a 70 y.o. very pleasant female patient who presents with the following:  Here today for a hospital follow-up- she had a fall and resulting cervical fracture last month She does have parkinson's disease so she has some physical debility at baseline Here today with her daughter [name] [name] has noted that her mom has been more confused since her fall.  She is living with her daughter right now.   They are in contact with an MSW right now who is trying to find placement for her.  [name] [name] has 3 children herself including an infant and a child with special needs- she is not able to provide long term in home care for her mom at this time.   She was in the hospital from 10/13 to 10/16, then inpt rehab from 10/16 to 10/28.   She had gotten up in the night, got out of bed and fell down some stairs- she fractured her C5 and is in a collar.  No operation needed.  She is seeing NSG this week  Wt Readings from Last 3 Encounters:  01/10/16 186 lb 12.8 oz (84.7 kg)  12/29/15 194 lb 9.6 oz (88.3 kg)  12/17/15 195 lb (88.5 kg)   No HA or vomiting   Her daughter has noted more issues with incont-  She has some urine leakage at baseline, but this has been worse for the last couple of weeks.  [name] [name] is having frequent urinary accidence which she cannot seem to control.  [name] [name] wonders if she might have a UTi She has not noted any evidence of pneumonia such as fever, cough   Patient Active Problem List   Diagnosis Date Noted  . Benign essential HTN   . Intractable pain 12/17/2015  . Displaced fracture of fifth cervical vertebra ([place]) 12/17/2015  . Gait disturbance 12/17/2015   . Trochanteric bursitis of both hips 09/13/2015  . Chronic low back pain 06/07/2015  . Fibromyalgia syndrome 05/17/2015  . Hypothyroidism 05/17/2015  . Parkinson's disease ([place]) 05/17/2015  . Hypercoagulable state ([place]) 02/07/2012  . History of pulmonary embolism 02/07/2012  . S/P right TKA 05/22/2011    Past Medical History:  Diagnosis Date  . Arthritis   . Chronic insomnia   . Complication of anesthesia    severe itching night of surgery requiring meds- also couldnt swallow- throat "paralyzed""  . Fibromyalgia   . Glaucoma   . Hypercholesterolemia   . Hyperlipidemia   . Hypertension    PCP Dr Cari Caraway- [facility]  05/15/11 with clearance and note on chart,    chest x ray, EKG 12/12 [facility], eccho 7/11 [facility]  . Hypothyroidism    s/p graves disease  . Kidney stone   . PD (Parkinson's disease) ([place])   . Peripheral vascular disease ([place])    PULMONARY EMBOLUS x 2- 2011, 2012/ FOLLOWED BY DR [id number] 12/12 [facility]   PT [facility] WILL STOP COUMADIN 3/12 and begin LOVONOX 05/17/11 as previously instructed  . Sinus infection    at present- dr Honor Loh PA aware- was seen there and at PCP 05/10/11  . Thyroid disease    Hypothyroidism    Past  Surgical History:  Procedure Laterality Date  . ABDOMINAL HYSTERECTOMY    . APPENDECTOMY    . CATARACT EXTRACTION Bilateral   . CHOLECYSTECTOMY    . COLONOSCOPY    . EXPLORATORY LAPAROTOMY    . JOINT REPLACEMENT     left knee  7/12  . TOTAL KNEE ARTHROPLASTY  05/22/2011   Procedure: TOTAL KNEE ARTHROPLASTY;  Surgeon: Mauri Pole, MD;  Location: [facility];  Service: Orthopedics;  Laterality: Right;    Social History  Substance Use Topics  . Smoking status: Former Smoker    Packs/day: 1.00    Quit date: 05/10/1989  . Smokeless tobacco: Never Used  . Alcohol use 0.0 oz/week     Comment: one drink once a month    Family History  Problem Relation Age of Onset  . Prostate cancer Father   . Stroke Father     Allergies  Allergen Reactions  . Crestor  [Rosuvastatin Calcium] Other (See Comments)    Feeling very bad, aching all over.  . Erythromycin Hives  . Gabapentin Other (See Comments)    Blurred vision  . Pregabalin Other (See Comments)    Crossed vision.  . Carbidopa Other (See Comments)    Headaches, nausea, dizziness  . Macrobid [Nitrofurantoin] Hives  . Losartan Other (See Comments)    Medication list has been reviewed and updated.  Current Outpatient Prescriptions on File Prior to Visit  Medication Sig Dispense Refill  . acetaminophen (TYLENOL) 500 MG tablet Take 500 mg by mouth every 6 (six) hours as needed for mild pain.    Marland Kitchen aspirin 81 MG tablet Take 81 mg by mouth daily after breakfast.     . atorvastatin (LIPITOR) 10 MG tablet Take 1 tablet (10 mg total) by mouth daily. 30 tablet 1  . azelastine (ASTELIN) 0.1 % nasal spray Place 1 spray into both nostrils 2 (two) times daily. Use in each nostril as directed    . Calcium Carbonate-Vitamin D (CALCIUM + D PO) Take 2 tablets by mouth at bedtime. 600-200 mg tab.    . Carbidopa-Levodopa ER (RYTARY) 23.75-95 MG CPCR Take 1 tablet by mouth 3 (three) times daily. 100 capsule 0  . celecoxib (CELEBREX) 100 MG capsule Take 1 capsule (100 mg total) by mouth 2 (two) times daily. 60 capsule 1  . Cholecalciferol (VITAMIN D3) 5000 UNITS CAPS Take 5,000 Units by mouth daily.     . cycloSPORINE (RESTASIS) 0.05 % ophthalmic emulsion Place 1 drop into both eyes daily as needed.    . DULoxetine (CYMBALTA) 60 MG capsule Take 1 capsule (60 mg total) by mouth daily. 30 capsule 3  . DULoxetine (CYMBALTA) 60 MG capsule Take 1 capsule (60 mg total) by mouth daily. 30 capsule 5  . DULoxetine (CYMBALTA) 60 MG capsule Take 1 capsule (60 mg total) by mouth daily. 90 capsule 0  . guaiFENesin (MUCINEX) 600 MG 12 hr tablet Take 1 tablet (600 mg total) by mouth 2 (two) times daily. 60 tablet 0  . Magnesium 250 MG TABS Take 1 tablet by mouth at bedtime.     . magnesium oxide (MAG-OX) 400 (241.3 Mg) MG  tablet Take 0.5 tablets (200 mg total) by mouth daily. 30 tablet 0  . methocarbamol (ROBAXIN) 500 MG tablet Take 1 tablet (500 mg total) by mouth 4 (four) times daily as needed for muscle spasms. 60 tablet 0  . Multiple Vitamin (MULTIVITAMIN) tablet Take 1 tablet by mouth daily.     . Omega-3 Fatty Acids (FISH  OIL) 1200 MG CAPS Take 2 capsules by mouth at bedtime.     Marland Kitchen oxyCODONE 10 MG TABS Take 1-1.5 tablets (10-15 mg total) by mouth every 4 (four) hours as needed for moderate pain. 20 tablet 0  . potassium chloride SA (K-DUR,KLOR-CON) 20 MEQ tablet Take 1 tablet (20 mEq total) by mouth daily. 30 tablet 0  . SYNTHROID 150 MCG tablet Take 1 tablet (150 mcg total) by mouth daily before breakfast. 30 tablet 1  . telmisartan-hydrochlorothiazide (MICARDIS HCT) 40-12.5 MG per tablet Take 1 tablet by mouth daily.   1  . vitamin B-12 (CYANOCOBALAMIN) 1000 MCG tablet Take 1,000 mcg by mouth daily.    Marland Kitchen warfarin (COUMADIN) 3 MG tablet Take 1 tablet (3 mg total) by mouth daily at 6 PM. 30 tablet 0  . zolpidem (AMBIEN) 10 MG tablet Take 1 tablet (10 mg total) by mouth at bedtime. 30 tablet 2   No current facility-administered medications on file prior to visit.     Review of Systems:  As per HPI- otherwise negative. [name] [name] does not give a lot of history at this time- she is eager to get rid of her cervical collar but I advised her that we need to defer this to [facility] who she will see this week  Physical Examination: Blood pressure 128/76, pulse (!) 114, temperature 97.6 F (36.4 C), temperature source Oral, height 5\' 6"  (1.676 m), weight 186 lb 12.8 oz (84.7 kg), SpO2 98 %.  Ideal Body Weight:    GEN: WDWN, NAD, Non-toxic, A & O x 3, wearing a hard cervical collar HEENT: Atraumatic, Normocephalic. No masses, No LAD.    PEERL, EOMI intact  Ears and Nose: No external deformity. CV: RRR, No M/G/R. No JVD. No thrill. No extra heart sounds.  Cardiac rate approx 100 BPM PULM: CTA B, no wheezes, crackles,  rhonchi. No retractions. No resp. distress. No accessory muscle use. EXTR: No c/c/e NEURO Normal gait for pt- slow, shuffling parkinson's gait, using a walker PSYCH: Normally interactive. Conversant. Not depressed or anxious appearing.  Calm demeanor.   Pt was not able to give a urine sample- she will go home and collect, daughter will return with this later today Assessment and Plan: Mixed stress and urge urinary incontinence - Plan: Urine culture, cephALEXin (KEFLEX) 500 MG capsule  History of pulmonary embolism - Plan: POCT INR  Closed nondisplaced fracture of fifth cervical vertebra with routine healing, unspecified fracture morphology, subsequent encounter  Hypothyroidism due to acquired atrophy of thyroid - Plan: TSH  Altered mental status, unspecified altered mental status type - Plan: CBC, Comprehensive metabolic panel  Here today to follow- up from a fall with resultant C5 fracture.  She is living with her daughter who notes that she is more confused than prior to her accident.  This is sub-acute, but she wonders if a UTI may be to blame.  We will plan to treat with keflex while we await a culture  Adjusted her coumadin dose slightly at her last INR check for INR of 1.7- recheck today is in range at 2.0.  Continue current regimen and plan to repeat INR in about one week  My phone note from 11/1 She is taking 3mg  daily = 21 mg a week.  Will have her increase to 4.5 mg twice a week = 24 mg.  Called [name] [name]- will have her take 4.5 mg on tues/ Thursday and 3 mg the other days of the week.  Will see me Monday and we  can do an INR then   Signed Lamar Blinks, MD

## 2016-01-10 NOTE — Telephone Encounter (Signed)
Caller name:[facility] Relationship to patient: Can be reached:[phone] Pharmacy:  Reason for call: Requesting OT 2x times a week for 5 weeks

## 2016-01-10 NOTE — Telephone Encounter (Signed)
Called and gave VO 

## 2016-01-10 NOTE — Patient Instructions (Addendum)
It was very nice to see you again today- I am so sorry that you got hurt!  I think it would be very helpful to try and get you off ambien- cut down to a 1/2 tablet, and hopefully we can get you off this totally.  Since you had this fall I do not think [name] is a safe medication for you to be using   We will check labs for you today and make sure that all is well We are also going to treat you for a potential UTI today with keflex for one week Please bring in your urine sample later today

## 2016-01-10 NOTE — Progress Notes (Signed)
Pre visit review using our clinic review tool, if applicable. No additional management support is needed unless otherwise documented below in the visit note. 

## 2016-01-11 ENCOUNTER — Encounter: Payer: Self-pay | Admitting: *Deleted

## 2016-01-11 DIAGNOSIS — Z4789 Encounter for other orthopedic aftercare: Secondary | ICD-10-CM | POA: Diagnosis not present

## 2016-01-11 DIAGNOSIS — M069 Rheumatoid arthritis, unspecified: Secondary | ICD-10-CM | POA: Diagnosis not present

## 2016-01-11 DIAGNOSIS — M6281 Muscle weakness (generalized): Secondary | ICD-10-CM | POA: Diagnosis not present

## 2016-01-11 DIAGNOSIS — M542 Cervicalgia: Secondary | ICD-10-CM | POA: Diagnosis not present

## 2016-01-11 DIAGNOSIS — Z86711 Personal history of pulmonary embolism: Secondary | ICD-10-CM | POA: Diagnosis not present

## 2016-01-11 DIAGNOSIS — R2689 Other abnormalities of gait and mobility: Secondary | ICD-10-CM | POA: Diagnosis not present

## 2016-01-11 DIAGNOSIS — E876 Hypokalemia: Secondary | ICD-10-CM | POA: Diagnosis not present

## 2016-01-11 DIAGNOSIS — N3000 Acute cystitis without hematuria: Secondary | ICD-10-CM | POA: Diagnosis not present

## 2016-01-11 DIAGNOSIS — D72828 Other elevated white blood cell count: Secondary | ICD-10-CM | POA: Diagnosis not present

## 2016-01-11 DIAGNOSIS — I10 Essential (primary) hypertension: Secondary | ICD-10-CM | POA: Diagnosis not present

## 2016-01-11 DIAGNOSIS — Z9181 History of falling: Secondary | ICD-10-CM | POA: Diagnosis not present

## 2016-01-11 DIAGNOSIS — G8929 Other chronic pain: Secondary | ICD-10-CM | POA: Diagnosis not present

## 2016-01-11 DIAGNOSIS — G20 Parkinson's disease: Secondary | ICD-10-CM | POA: Diagnosis not present

## 2016-01-11 DIAGNOSIS — G232 Striatonigral degeneration: Secondary | ICD-10-CM | POA: Diagnosis not present

## 2016-01-11 DIAGNOSIS — M797 Fibromyalgia: Secondary | ICD-10-CM | POA: Diagnosis not present

## 2016-01-11 DIAGNOSIS — E038 Other specified hypothyroidism: Secondary | ICD-10-CM | POA: Diagnosis not present

## 2016-01-11 DIAGNOSIS — S12490D Other displaced fracture of fifth cervical vertebra, subsequent encounter for fracture with routine healing: Secondary | ICD-10-CM | POA: Diagnosis not present

## 2016-01-11 DIAGNOSIS — M25511 Pain in right shoulder: Secondary | ICD-10-CM | POA: Diagnosis not present

## 2016-01-11 DIAGNOSIS — R2681 Unsteadiness on feet: Secondary | ICD-10-CM | POA: Diagnosis not present

## 2016-01-11 DIAGNOSIS — G4700 Insomnia, unspecified: Secondary | ICD-10-CM | POA: Diagnosis not present

## 2016-01-11 DIAGNOSIS — I749 Embolism and thrombosis of unspecified artery: Secondary | ICD-10-CM | POA: Diagnosis not present

## 2016-01-11 DIAGNOSIS — Z7901 Long term (current) use of anticoagulants: Secondary | ICD-10-CM | POA: Diagnosis not present

## 2016-01-11 DIAGNOSIS — R531 Weakness: Secondary | ICD-10-CM | POA: Diagnosis not present

## 2016-01-11 DIAGNOSIS — S129XXA Fracture of neck, unspecified, initial encounter: Secondary | ICD-10-CM | POA: Diagnosis not present

## 2016-01-11 DIAGNOSIS — S12490S Other displaced fracture of fifth cervical vertebra, sequela: Secondary | ICD-10-CM | POA: Diagnosis not present

## 2016-01-11 DIAGNOSIS — E784 Other hyperlipidemia: Secondary | ICD-10-CM | POA: Diagnosis not present

## 2016-01-11 NOTE — Progress Notes (Signed)
This encounter was created in error - please disregard.

## 2016-01-12 ENCOUNTER — Non-Acute Institutional Stay (SKILLED_NURSING_FACILITY): Payer: Medicare Other | Admitting: Internal Medicine

## 2016-01-12 ENCOUNTER — Encounter: Payer: Self-pay | Admitting: Internal Medicine

## 2016-01-12 DIAGNOSIS — G20 Parkinson's disease: Secondary | ICD-10-CM | POA: Diagnosis not present

## 2016-01-12 DIAGNOSIS — I10 Essential (primary) hypertension: Secondary | ICD-10-CM

## 2016-01-12 DIAGNOSIS — S12490S Other displaced fracture of fifth cervical vertebra, sequela: Secondary | ICD-10-CM

## 2016-01-12 DIAGNOSIS — M797 Fibromyalgia: Secondary | ICD-10-CM

## 2016-01-12 DIAGNOSIS — R531 Weakness: Secondary | ICD-10-CM | POA: Diagnosis not present

## 2016-01-12 DIAGNOSIS — E784 Other hyperlipidemia: Secondary | ICD-10-CM

## 2016-01-12 DIAGNOSIS — Z86711 Personal history of pulmonary embolism: Secondary | ICD-10-CM | POA: Diagnosis not present

## 2016-01-12 DIAGNOSIS — D72828 Other elevated white blood cell count: Secondary | ICD-10-CM | POA: Diagnosis not present

## 2016-01-12 DIAGNOSIS — N3000 Acute cystitis without hematuria: Secondary | ICD-10-CM

## 2016-01-12 DIAGNOSIS — M542 Cervicalgia: Secondary | ICD-10-CM | POA: Diagnosis not present

## 2016-01-12 DIAGNOSIS — E038 Other specified hypothyroidism: Secondary | ICD-10-CM | POA: Diagnosis not present

## 2016-01-12 DIAGNOSIS — S129XXA Fracture of neck, unspecified, initial encounter: Secondary | ICD-10-CM | POA: Diagnosis not present

## 2016-01-12 DIAGNOSIS — E7849 Other hyperlipidemia: Secondary | ICD-10-CM

## 2016-01-12 LAB — URINE CULTURE

## 2016-01-12 NOTE — Progress Notes (Signed)
LOCATION: [facility]  PCP: Lamar Blinks, MD   Code Status: DNR  Goals of care: Advanced Directive information Advanced Directives 01/12/2016  Does patient have an advance directive? Yes  Type of Advance Directive Out of facility DNR (pink MOST or yellow form)  Does patient want to make changes to advanced directive? No - Patient declined  Copy of advanced directive(s) in chart? Yes       Extended Emergency Contact Information Primary Emergency Contact: Keitha Butte [place] Phone: [phone] Mobile Phone: [phone] Relation: Daughter Secondary Emergency Contact: Debroah Baller [place] Phone: [phone] Relation: Son   Allergies  Allergen Reactions  . Crestor [Rosuvastatin Calcium] Other (See Comments)    Feeling very bad, aching all over.  . Erythromycin Hives  . Gabapentin Other (See Comments)    Blurred vision  . Pregabalin Other (See Comments)    Crossed vision.  . Carbidopa Other (See Comments)    Headaches, nausea, dizziness  . Macrobid [Nitrofurantoin] Hives  . Losartan Other (See Comments)    Chief Complaint  Patient presents with  . New Admit To SNF    New Admission Visit     HPI:  Patient is a 70 y.o. female seen today for short term rehabilitation post hospital admission from 12/17/15-12/20/15 and inpatient rehabilitation from 12/20/15-01/01/16 post fall with displaced C5 fracture. Neurosurgery was consulted and recommended cervical collar and pain management She has past medical history of pulmonary emboli, parkinson's disease and fibromyalgia. She then went to live with her daughter for 1 week and now is here to undergo rehabilitation. She has been started on antibiotic for UTI by her PCP and urine cultue is pending.   Review of Systems:  Constitutional: Negative for fever, chills, malaise and diaphoresis.  HENT: Negative for headache, congestion, nasal discharge, sore throat,  difficulty swallowing.   Eyes: Negative for blurred vision, double vision and discharge. Wears glasses. Respiratory: Negative for cough, shortness of breath and wheezing.   Cardiovascular: Negative for chest pain, palpitations, leg swelling.  Gastrointestinal: Negative for heartburn, nausea, vomiting, abdominal pain. Last bowel movement was today. Genitourinary: Negative for dysuria and flank pain.  Musculoskeletal: Negative for fall in the facility.  Skin: Negative for itching, rash.  Neurological: Negative for dizziness. Psychiatric/Behavioral: Negative for depression.   Past Medical History:  Diagnosis Date  . Arthritis   . Chronic insomnia   . Complication of anesthesia    severe itching night of surgery requiring meds- also couldnt swallow- throat "paralyzed""  . Fibromyalgia   . Glaucoma   . Hypercholesterolemia   . Hyperlipidemia   . Hypertension    PCP Dr Cari Caraway- [facility]  05/15/11 with clearance and note on chart,    chest x ray, EKG 12/12 [facility], eccho 7/11 [facility]  . Hypothyroidism    s/p graves disease  . Kidney stone   . PD (Parkinson's disease) ([place])   . Peripheral vascular disease ([place])    PULMONARY EMBOLUS x 2- 2011, 2012/ FOLLOWED BY DR [id number]-LOV 12/12 [facility]   PT [facility] WILL STOP COUMADIN 3/12 and begin LOVONOX 05/17/11 as previously instructed  . Sinus infection    at present- dr Honor Loh PA aware- was seen there and at PCP 05/10/11  . Thyroid disease    Hypothyroidism   Past Surgical History:  Procedure Laterality Date  . ABDOMINAL HYSTERECTOMY    . APPENDECTOMY    . CATARACT EXTRACTION Bilateral   . CHOLECYSTECTOMY    . COLONOSCOPY    .  EXPLORATORY LAPAROTOMY    . JOINT REPLACEMENT     left knee  7/12  . TOTAL KNEE ARTHROPLASTY  05/22/2011   Procedure: TOTAL KNEE ARTHROPLASTY;  Surgeon: Mauri Pole, MD;  Location: [facility];  Service: Orthopedics;  Laterality: Right;   Social History:   reports that she quit smoking about 26 years ago. She smoked 1.00 pack per  day. She has never used smokeless tobacco. She reports that she drinks alcohol. She reports that she does not use drugs.  Family History  Problem Relation Age of Onset  . Prostate cancer Father   . Stroke Father     Medications:   Medication List       Accurate as of 01/12/16 10:58 AM. Always use your most recent med list.          acetaminophen 500 MG tablet Commonly known as:  TYLENOL Take 500 mg by mouth every 6 (six) hours as needed for mild pain.   aspirin 81 MG tablet Take 81 mg by mouth daily after breakfast.   atorvastatin 10 MG tablet Commonly known as:  LIPITOR Take 1 tablet (10 mg total) by mouth daily.   azelastine 0.1 % nasal spray Commonly known as:  ASTELIN Place 1 spray into both nostrils 2 (two) times daily. Use in each nostril as directed   Carbidopa-Levodopa ER 23.75-95 MG Cpcr Commonly known as:  RYTARY Take 1 tablet by mouth 3 (three) times daily.   celecoxib 100 MG capsule Commonly known as:  CELEBREX Take 1 capsule (100 mg total) by mouth 2 (two) times daily.   cyanocobalamin 500 MCG tablet Take 500 mcg by mouth daily.   DULoxetine 60 MG capsule Commonly known as:  CYMBALTA Take 1 capsule (60 mg total) by mouth daily.   Fish Oil 1200 MG Caps Take 1 capsule by mouth at bedtime.   magnesium oxide 400 (241.3 Mg) MG tablet Commonly known as:  MAG-OX Take 0.5 tablets (200 mg total) by mouth daily.   methocarbamol 500 MG tablet Commonly known as:  ROBAXIN Take 500 mg by mouth every 6 (six) hours as needed for muscle spasms.   oxycodone 5 MG capsule Commonly known as:  OXY-IR Take 10 mg by mouth every 4 (four) hours as needed for pain.   potassium chloride SA 20 MEQ tablet Commonly known as:  K-DUR,KLOR-CON Take 1 tablet (20 mEq total) by mouth daily.   SYNTHROID 150 MCG tablet Generic drug:  levothyroxine Take 1 tablet (150 mcg total) by mouth daily before breakfast.   telmisartan-hydrochlorothiazide 40-12.5 MG tablet Commonly  known as:  MICARDIS HCT Take 1 tablet by mouth daily.   Vitamin D3 5000 units Caps Take 5,000 Units by mouth daily.   warfarin 3 MG tablet Commonly known as:  COUMADIN Take 1 tablet (3 mg total) by mouth daily at 6 PM.   zolpidem 5 MG tablet Commonly known as:  AMBIEN Take 5 mg by mouth at bedtime.       Immunizations: Immunization History  Administered Date(s) Administered  . Influenza, High Dose Seasonal PF 11/24/2015     Physical Exam: Vitals:   01/12/16 1042  BP: 132/74  Pulse: 87  Resp: 18  Temp: 98.1 F (36.7 C)  TempSrc: Oral  SpO2: 97%  Weight: 194 lb (88 kg)  Height: 5\' 6"  (1.676 m)   Body mass index is 31.31 kg/m.  General- elderly female, obese, in no acute distress Head- normocephalic, atraumatic Nose- no maxillary or frontal sinus tenderness, no nasal  discharge Throat- moist mucus membrane Eyes- PERRLA, EOMI, no pallor, no icterus Neck- no cervical lymphadenopathy Cardiovascular- normal s1,s2, no murmur Respiratory- bilateral clear to auscultation, no wheeze, no rhonchi, no crackles, no use of accessory muscles Abdomen- bowel sounds present, soft, non tender Musculoskeletal- able to move all 4 extremities, generalized weakness Neurological- alert and oriented to person, place and time Skin- warm and dry Psychiatry- normal mood and affect    Labs reviewed: Basic Metabolic Panel:  Recent Labs  12/21/15 0630 12/27/15 0509 01/10/16 1207  NA 138 138 140  K 3.3* 4.2 3.7  CL 100* 103 103  CO2 28 29 27   GLUCOSE 134* 130* 123*  BUN 11 13 15   CREATININE 0.62 0.71 0.77  CALCIUM 9.1 9.2 10.3   Liver Function Tests:  Recent Labs  11/24/15 1103 12/21/15 0630 01/10/16 1207  AST 22 39 27  ALT 14 65* 14  ALKPHOS 79 91 156*  BILITOT 0.5 1.3* 0.8  PROT 6.5 6.0* 7.3  ALBUMIN 3.5 3.1* 4.2   No results for input(s): LIPASE, AMYLASE in the last 8760 hours. No results for input(s): AMMONIA in the last 8760 hours. CBC:  Recent Labs   12/17/15 0738  12/21/15 0630 12/27/15 0509 01/10/16 1207  WBC 17.4*  < > 10.1 9.1 10.8*  NEUTROABS 15.4*  --  6.5  --   --   HGB 14.0  < > 13.2 12.9 14.9  HCT 42.4  < > 39.1 40.0 44.7  MCV 88.1  < > 86.9 89.7 87.8  PLT 258  < > 321 388 437.0*  < > = values in this interval not displayed.   Radiological Exams: Dg Chest 1 View  Result Date: 12/17/2015 CLINICAL DATA:  Fall EXAM: CHEST 1 VIEW COMPARISON:  11/26/2010 FINDINGS: Borderline cardiomegaly. No acute infiltrate or pleural effusion. No pulmonary edema. Mild degenerative changes mid thoracic spine. IMPRESSION: No active disease. Electronically Signed   By: Lahoma Crocker M.D.   On: 12/17/2015 11:24   Dg Pelvis 1-2 Views  Result Date: 12/17/2015 CLINICAL DATA:  Fall with C5 fracture. EXAM: PELVIS - 1-2 VIEW COMPARISON:  Abdominal CT 09/07/2012 FINDINGS: Limited evaluation of the sacrum due to bowel gas. No suspected fracture. No diastasis. Osteopenia. Lower lumbar facet arthropathy IMPRESSION: No acute finding. Electronically Signed   By: Monte Fantasia M.D.   On: 12/17/2015 11:18   Ct Head Wo Contrast  Result Date: 12/20/2015 CLINICAL DATA:  Fall earlier today.  Parkinson's disease. EXAM: CT HEAD WITHOUT CONTRAST TECHNIQUE: Contiguous axial images were obtained from the base of the skull through the vertex without intravenous contrast. COMPARISON:  December 17, 2015 FINDINGS: Brain: Mild diffuse atrophy remain stable. There is a cavum septum pellucidum, an anatomic variant. There is no intracranial mass, hemorrhage, extra-axial fluid collection, or midline shift. There is patchy small vessel disease in the centra semiovale bilaterally, stable. There is no new gray-white compartment lesion. No acute infarct is evident. Vascular: No hyperdense vessels are evident. There is calcification in both carotid siphon regions, somewhat more on the right than on the left. Skull: The bony calvarium appears intact. Sinuses/Orbits: There is mucosal  thickening in multiple ethmoid air cells bilaterally. There is slight mucosal thickening in the inferior right frontal sinus. Mild mucosal thickening is noted in the visualized right maxillary antrum. Other visualized paranasal sinuses are clear. There is slight rightward deviation of the nasal septum. Orbits appear symmetric bilaterally. Other: Mastoid air cells are clear. IMPRESSION: Stable atrophy with patchy periventricular small vessel disease.  No acute infarct. No intracranial mass, hemorrhage, or extra-axial fluid collection. Foci of arterial vascular calcification noted. Areas of paranasal sinus disease noted. Electronically Signed   By: Lowella Grip III M.D.   On: 12/20/2015 07:11   Ct Head Wo Contrast  Result Date: 12/17/2015 CLINICAL DATA:  Posttraumatic headache and neck pain after fall off steps. EXAM: CT HEAD WITHOUT CONTRAST CT CERVICAL SPINE WITHOUT CONTRAST TECHNIQUE: Multidetector CT imaging of the head and cervical spine was performed following the standard protocol without intravenous contrast. Multiplanar CT image reconstructions of the cervical spine were also generated. COMPARISON:  CT scan of December 09, 2014. FINDINGS: CT HEAD FINDINGS Brain: Mild diffuse cortical atrophy is noted. Mild chronic ischemic white matter disease is noted. No mass effect or midline shift is noted. Ventricular size is within normal limits. There is no evidence of mass lesion, hemorrhage or acute infarction. Vascular: Atherosclerosis of internal carotid arteries is noted. Skull: Bony calvarium appears intact. Sinuses/Orbits: Visualized paranasal sinuses appear normal. Other: None. CT CERVICAL SPINE FINDINGS Alignment: Normal. Skull base and vertebrae: Moderately displaced fracture is seen involving the posterior spinous process of C5. No other fracture is noted. Soft tissues and spinal canal: No definite soft tissue abnormality is noted. Disc levels: Mild degenerative disc disease is noted at C6-7 with  anterior and posterior osteophyte formation. Fusion of the left-sided posterior facet joints is noted at C2-3, C3-4, and C4-5. Upper chest: Visualized lung fields appear normal. Other: Atherosclerosis of thoracic aorta is noted. IMPRESSION: Mild diffuse cortical atrophy. Mild chronic ischemic white matter disease. No acute intracranial abnormality seen. Mild degenerative disc disease is noted at C6-7. Other degenerative changes are noted. Moderately displaced fracture is seen involving the posterior spinous process of C5. Critical Value/emergent results were called by telephone at the time of interpretation on 12/17/2015 at 9:08 am to Dr. Leo Grosser , who verbally acknowledged these results. Electronically Signed   By: Marijo Conception, M.D.   On: 12/17/2015 09:11   Ct Cervical Spine Wo Contrast  Result Date: 12/17/2015 CLINICAL DATA:  Posttraumatic headache and neck pain after fall off steps. EXAM: CT HEAD WITHOUT CONTRAST CT CERVICAL SPINE WITHOUT CONTRAST TECHNIQUE: Multidetector CT imaging of the head and cervical spine was performed following the standard protocol without intravenous contrast. Multiplanar CT image reconstructions of the cervical spine were also generated. COMPARISON:  CT scan of December 09, 2014. FINDINGS: CT HEAD FINDINGS Brain: Mild diffuse cortical atrophy is noted. Mild chronic ischemic white matter disease is noted. No mass effect or midline shift is noted. Ventricular size is within normal limits. There is no evidence of mass lesion, hemorrhage or acute infarction. Vascular: Atherosclerosis of internal carotid arteries is noted. Skull: Bony calvarium appears intact. Sinuses/Orbits: Visualized paranasal sinuses appear normal. Other: None. CT CERVICAL SPINE FINDINGS Alignment: Normal. Skull base and vertebrae: Moderately displaced fracture is seen involving the posterior spinous process of C5. No other fracture is noted. Soft tissues and spinal canal: No definite soft tissue  abnormality is noted. Disc levels: Mild degenerative disc disease is noted at C6-7 with anterior and posterior osteophyte formation. Fusion of the left-sided posterior facet joints is noted at C2-3, C3-4, and C4-5. Upper chest: Visualized lung fields appear normal. Other: Atherosclerosis of thoracic aorta is noted. IMPRESSION: Mild diffuse cortical atrophy. Mild chronic ischemic white matter disease. No acute intracranial abnormality seen. Mild degenerative disc disease is noted at C6-7. Other degenerative changes are noted. Moderately displaced fracture is seen involving the posterior spinous process of  C5. Critical Value/emergent results were called by telephone at the time of interpretation on 12/17/2015 at 9:08 am to Dr. Leo Grosser , who verbally acknowledged these results. Electronically Signed   By: Marijo Conception, M.D.   On: 12/17/2015 09:11   Dg Shoulder Left  Result Date: 12/17/2015 CLINICAL DATA:  Status post fall down stairs this morning with a left shoulder injury. Pain. Initial encounter. EXAM: LEFT SHOULDER - 2+ VIEW COMPARISON:  None. FINDINGS: No acute bony or joint abnormality is identified. Mild to moderate acromioclavicular osteoarthritis is noted. Imaged left lung and ribs are unremarkable. IMPRESSION: No acute finding. Electronically Signed   By: Inge Rise M.D.   On: 12/17/2015 08:43    Assessment/Plan   Generalized weakness Will have her work with physical therapy and occupational therapy team to help with gait training and muscle strengthening exercises.fall precautions. Skin care. Encourage to be out of bed.   C5 fracture To wear her cervical collar. Will have patient work with PT/OT as tolerated to regain strength and restore function.  Fall precautions are in place. Continue tylenol 500 mg q6h prn pain and celecoxib bid and get PMR consult. Continue robaxin 500 mg q6h prn muscle spasm. Also on oxyIR 10 mg q4h prn pain  Pulmonary embolism Stable breathing. INR 2.1  yesterday. continue coumadin 3 mg po daily and check inr 01/14/16.   UTI Start cephalexin 500 mg bid x 1 week encourage hydration. Maintain perineal hygiene. To follow on urine culture form PCP office.   Leukocytosis To be started on antibiotic for UTI. Monitor wbc curve  HLD Continue atorvastatin  Parkinson's disease Continue her carbidopa-levodopa tid  fibromyalgia  Continue cymbalta and pain medication as above, PMR consult  Hypothyroidism Continue levothyroxine and monitor  HTN Monitor bp reading. Continue micardis hctz current regimen and monitor BP. Check bmp   Goals of care: short term rehabilitation   Labs/tests ordered: cbc, cmp 01/17/16  Family/ staff Communication: reviewed care plan with patient and nursing supervisor    Blanchie Serve, MD Internal Medicine [place], [place] [address] Cell Phone (Monday-Friday 8 am - 5 pm): [phone] On Call: [phone] and follow prompts after 5 pm and on weekends Office Phone: [phone] Office Fax: [phone]

## 2016-01-13 ENCOUNTER — Inpatient Hospital Stay: Payer: Medicare Other | Admitting: Physical Medicine & Rehabilitation

## 2016-01-14 DIAGNOSIS — M25511 Pain in right shoulder: Secondary | ICD-10-CM | POA: Diagnosis not present

## 2016-01-14 DIAGNOSIS — G8929 Other chronic pain: Secondary | ICD-10-CM | POA: Diagnosis not present

## 2016-01-14 DIAGNOSIS — M542 Cervicalgia: Secondary | ICD-10-CM | POA: Diagnosis not present

## 2016-01-14 DIAGNOSIS — M797 Fibromyalgia: Secondary | ICD-10-CM | POA: Diagnosis not present

## 2016-01-14 DIAGNOSIS — R2681 Unsteadiness on feet: Secondary | ICD-10-CM | POA: Diagnosis not present

## 2016-01-14 DIAGNOSIS — S129XXA Fracture of neck, unspecified, initial encounter: Secondary | ICD-10-CM | POA: Diagnosis not present

## 2016-01-17 LAB — CBC AND DIFFERENTIAL
HEMATOCRIT: 42 % (ref 36–46)
HEMOGLOBIN: 13.5 g/dL (ref 12.0–16.0)
PLATELETS: 308 10*3/uL (ref 150–399)
WBC: 7.9 10^3/mL

## 2016-01-17 LAB — BASIC METABOLIC PANEL
BUN: 16 mg/dL (ref 4–21)
Creatinine: 0.6 mg/dL (ref 0.5–1.1)
Glucose: 120 mg/dL
Potassium: 4.5 mmol/L (ref 3.4–5.3)
SODIUM: 145 mmol/L (ref 137–147)

## 2016-01-17 LAB — HEPATIC FUNCTION PANEL
ALK PHOS: 121 U/L (ref 25–125)
ALT: 14 U/L (ref 7–35)
AST: 26 U/L (ref 13–35)
BILIRUBIN, TOTAL: 0.8 mg/dL

## 2016-01-20 DIAGNOSIS — R2681 Unsteadiness on feet: Secondary | ICD-10-CM | POA: Diagnosis not present

## 2016-01-20 DIAGNOSIS — M797 Fibromyalgia: Secondary | ICD-10-CM | POA: Diagnosis not present

## 2016-01-20 DIAGNOSIS — M542 Cervicalgia: Secondary | ICD-10-CM | POA: Diagnosis not present

## 2016-01-20 DIAGNOSIS — M25511 Pain in right shoulder: Secondary | ICD-10-CM | POA: Diagnosis not present

## 2016-01-20 DIAGNOSIS — G8929 Other chronic pain: Secondary | ICD-10-CM | POA: Diagnosis not present

## 2016-01-20 DIAGNOSIS — S129XXA Fracture of neck, unspecified, initial encounter: Secondary | ICD-10-CM | POA: Diagnosis not present

## 2016-01-21 ENCOUNTER — Non-Acute Institutional Stay (SKILLED_NURSING_FACILITY): Payer: Medicare Other | Admitting: Adult Health

## 2016-01-21 ENCOUNTER — Ambulatory Visit: Payer: Medicare Other | Admitting: Neurology

## 2016-01-21 DIAGNOSIS — Z7901 Long term (current) use of anticoagulants: Secondary | ICD-10-CM

## 2016-01-21 DIAGNOSIS — Z86711 Personal history of pulmonary embolism: Secondary | ICD-10-CM | POA: Diagnosis not present

## 2016-01-21 NOTE — Progress Notes (Signed)
Subjective:     Indication: Hx of PE Bleeding signs/symptoms: None Thromboembolic signs/symptoms: None  Missed Coumadin doses: None Medication changes: no Dietary changes: no Bacterial/viral infection: no Other concerns: no  The following portions of the patient's history were reviewed and updated as appropriate: allergies, current medications, past family history, past medical history, past social history, past surgical history and problem list.  Review of Systems A comprehensive review of systems was negative.   Objective:    INR Today: 1.6 Current dose: Coumadin 4 mg    Assessment:    Subtherapeutic INR for goal of 2-3   Plan:    1. New dose: Increase Coumadin to 5 mg PO Q D   2. Next INR: 01/25/16

## 2016-01-24 DIAGNOSIS — M25511 Pain in right shoulder: Secondary | ICD-10-CM | POA: Diagnosis not present

## 2016-01-24 DIAGNOSIS — G8929 Other chronic pain: Secondary | ICD-10-CM | POA: Diagnosis not present

## 2016-01-24 DIAGNOSIS — M797 Fibromyalgia: Secondary | ICD-10-CM | POA: Diagnosis not present

## 2016-01-24 DIAGNOSIS — S129XXA Fracture of neck, unspecified, initial encounter: Secondary | ICD-10-CM | POA: Diagnosis not present

## 2016-01-24 DIAGNOSIS — R2681 Unsteadiness on feet: Secondary | ICD-10-CM | POA: Diagnosis not present

## 2016-01-24 DIAGNOSIS — M542 Cervicalgia: Secondary | ICD-10-CM | POA: Diagnosis not present

## 2016-01-24 NOTE — Progress Notes (Signed)
[name] [name] was seen today in the movement disorders clinic for neurologic consultation at the request of [name],[name], MD.   This patient is accompanied in the office by her child who supplements the history.   Prior records made available to me were reviewed.  The patient first saw Dr. Leta [name] in March, 2016 at which point she was complaining about symptoms of diplopia and weakness.  Because of the diplopia she had acetylcholine receptor antibodies that were performed that were negative.  She has had an MRI and MRA of the brain with and without gadolinium.  I reviewed these images.  These were done on 05/21/2014.  This just revealed mild small vessel disease.  She also had a carotid ultrasound with him in March, 2016 revealed less than 50% stenosis bilaterally.  She followed up with him on 07/23/2014 and started on carbidopa/levodopa 25/100, one tablet 3 times per day (8am, 12pm, 5pm, before the meals).  She called back to him and complained about diffuse psain, headaches, depression and nausea.  Although he was not completely convinced that some of these things were not associated with her fibromyalgia and situational depression (her grandson was leaving for college and he serves as a caregiver), it was decided to taper off of her levodopa on 09/16/2014.   She did this and is now off the medication.  She states today that she initially felt great on the medication but then felt that it caused side effects.  Now that she is off of it, however, she c/o difficulty with walking. They also discussed referrals to pain management and psychiatry.  She presents today for a second opinion.  Pt states that diplopia was her first sx and then she began to notice falling.  This started in 04/2013.  The diplopia was horizontal in nature and she initially thought that it was due to lyrica.  The lyrica was d/c and it initally seemed to get better but then it came back.  It gets better if she closes an eye (doesn't  know if it matters which eye she closes).  She has some degree of diplopia daily but mostly at night.  She sees Dr. [name] at [facility] and was told that nothing was wrong with the eyes.    10/29/14 update:  The patient is following up today.  Overall, she states that she is feeling well.  She really hasn't had significant diplopia since our last visit.  She had her DaT scan on 10/14/2014 and while tracer uptake was present in the bilateral attainment, it was decreased on the right putamen relative to the right caudate, which can be seen in early parkinsonian syndromes.  The patient admits that she is still very slow.  She saw a different ophthalmologist [facility] imaging since our last visit and states that the diagnosis of glaucoma was retracted and everything looked well.  She denies any falls since our last visit no hallucinations.  12/10/14 update:  The patient is accompanied by her daughter who supplements the history.  I started the patient on carbidopa/levodopa 25/100 tid last visit.  Today, she states that she thinks that the medication makes her fibromyalgia worse but admits that it could be the weather.  Admits to a fall yesterday.   She was going into the house and there is an odd shaped stair (L shaped landing) and she fell backwards.   She called 911 and she went to [facility] and she had a CT brain and she states that it  was negative.  Had her INR checked and it was 2.6.  Does state that she was vomiting on way on the way to the hospital so they kept her for a few hours.  Still nauseated some today but has been nauseated even prior to that and wonders if the medication contributes.  She did not take any carbidopa/levodopa today.   03/09/14 update:  The patient follows up today.  Last visit, she complained about multiple side effects of levodopa including the fact that she thought that it made her fibromyalgia worse.  Although I told her that I was not convinced that it caused many of these side  effects, we ultimately decided to try rytary instead.  She started with 95 mg 3 times a day and worked up to 145 mg 3 times a day.  She called me on October 20 to state that she was doing well and then called back on November 11 to state that the medication was causing dizziness and diplopia.  She wanted to decrease the medication.  We explained that sometimes the disease can cause the symptoms and so I had her hold the medication for a few days and she stated that the dizziness and diplopia resolved.  She asked again to go back down to the 95 mg 3 times a day, which we ultimately did.  She feels well going to PT.  She is not exercising otherwise.  She asks me for a referral to pain management for her fibromyalgia.  She has not had falls.  No hallucinations.    07/13/15 update:  The patient follows up today.  This patient is accompanied in the office by her daughter who supplements the history.  She is on Rytary, 95 mg 3 times per day.  She doesn't think that it wears off.  It is very expensive but she didn't qualify for the [facility] program.  She did physical therapy since our last visit.  She was referred to pain management with Dr. Letta Pate.  I reviewed his records and appreciate the input.  She was started on Cymbalta and she states that she is doing well with that and ultram.  She felt great yesterday but doesn't today and attributes that to the rain.  No hallucinations.  Still with diplopia.  Daughter asks about referral to neuro-ophth.  10/21/15 update:  The patient follows up today, unaccompanied today.  She remains on Rytary, 95 mg 3 times per day.  Last visit, she wanted a referral to neuro-ophthalmology for her diplopia.  Her appt is next month.  She has not yet had that appt that she did call me and stated that she wanted to go off of the rytary and back on the older version of levodopa because she thought that the rytary was causing double vision.  We had explained to her that this just did not make  physiologic sense because they all essentially have the same ingredient.  Therefore, she is still on the rytary. She has to cover an eye (either) to see well.   Had a fall from bathroom into bedroom.  She bruised her arms.  One other fall that was very similar.  Has been "on an off" with physical therapy.  Has a stationary bike and rides that 3 times a week in the house.    01/25/16 update:  The patient follows up today.   She is accompanied by her daughter who supplement the history.   Much has happened since our last visit and I  reviewed her records.  She saw Dr. Hassell Done on 11/22/2015.  He repeated an acetylcholine receptor antibody and obtain a CT of the orbits. He sent the patient to see Jerre Simon, orthoptist, to see if prism would be of help with regard to her ocular motility disorder.  She had a carotid ultrasound on 10/28/2015 that demonstrated 1-39% stenosis bilaterally.  She was admitted to the hospital from October 13 to October 16 and then went to inpatient rehabilitation from October 16 to October 27.  She is now in subacute rehab.  She got up in the middle of the night to use the bathroom and lost her balance and fell through the sheet rock.  She sustained a moderately displaced fracture is seen involving the posterior spinous process of C5.  Daughter thinks more confused since fall.  Having trouble getting into phone so had to take passcode off of phone. Trouble paying bills.  Goal is to go back home independently but family doesn't want that.  Family would like assisted living with monitored meds.  Daughter doesn't think that meds were being taken correctly.  CT brain has been negative for bleed.  Saw Dr. Beverly Gust in hospital for brief neuropsych exam and she recommended complete neuropsych testing in 3 months.  Still on Rytary 95 mg tid.     PREVIOUS MEDICATIONS: Sinemet  ALLERGIES:   Allergies  Allergen Reactions  . Crestor [Rosuvastatin Calcium] Other (See Comments)    Feeling very bad,  aching all over.  . Erythromycin Hives  . Gabapentin Other (See Comments)    Blurred vision  . Pregabalin Other (See Comments)    Crossed vision.  . Carbidopa Other (See Comments)    Headaches, nausea, dizziness  . Macrobid [Nitrofurantoin] Hives  . Losartan Other (See Comments)    CURRENT MEDICATIONS:  Outpatient Encounter Prescriptions as of 01/25/2016  Medication Sig  . acetaminophen (TYLENOL) 500 MG tablet Take 500 mg by mouth every 6 (six) hours as needed for mild pain.  Marland Kitchen aspirin 81 MG tablet Take 81 mg by mouth daily after breakfast.   . atorvastatin (LIPITOR) 10 MG tablet Take 1 tablet (10 mg total) by mouth daily.  Marland Kitchen azelastine (ASTELIN) 0.1 % nasal spray Place 1 spray into both nostrils 2 (two) times daily. Use in each nostril as directed  . Carbidopa-Levodopa ER (RYTARY) 23.75-95 MG CPCR Take 1 tablet by mouth 3 (three) times daily.  . celecoxib (CELEBREX) 100 MG capsule Take 1 capsule (100 mg total) by mouth 2 (two) times daily.  . Cholecalciferol (VITAMIN D3) 5000 UNITS CAPS Take 5,000 Units by mouth daily.   . cyanocobalamin 500 MCG tablet Take 500 mcg by mouth daily.  . DULoxetine (CYMBALTA) 60 MG capsule Take 1 capsule (60 mg total) by mouth daily.  . magnesium oxide (MAG-OX) 400 (241.3 Mg) MG tablet Take 0.5 tablets (200 mg total) by mouth daily.  . Menthol, Topical Analgesic, (BIOFREEZE EX) Apply topically.  . methocarbamol (ROBAXIN) 500 MG tablet Take 500 mg by mouth every 6 (six) hours as needed for muscle spasms.  Marland Kitchen omega-3 acid ethyl esters (LOVAZA) 1 g capsule Take by mouth 2 (two) times daily.  . Oxycodone HCl 10 MG TABS Take by mouth.  . potassium chloride SA (K-DUR,KLOR-CON) 20 MEQ tablet Take 1 tablet (20 mEq total) by mouth daily.  Marland Kitchen SYNTHROID 150 MCG tablet Take 1 tablet (150 mcg total) by mouth daily before breakfast.  . telmisartan-hydrochlorothiazide (MICARDIS HCT) 40-12.5 MG per tablet Take 1 tablet by mouth  daily.   . warfarin (COUMADIN) 3 MG tablet  Take 1 tablet (3 mg total) by mouth daily at 6 PM. (Patient taking differently: Take 5 mg by mouth daily at 6 PM. )  . zolpidem (AMBIEN) 5 MG tablet Take 5 mg by mouth at bedtime.  . [DISCONTINUED] Omega-3 Fatty Acids (FISH OIL) 1200 MG CAPS Take 1 capsule by mouth at bedtime.   . [DISCONTINUED] oxycodone (OXY-IR) 5 MG capsule Take 10 mg by mouth every 4 (four) hours as needed for pain.   No facility-administered encounter medications on file as of 01/25/2016.     PAST MEDICAL HISTORY:   Past Medical History:  Diagnosis Date  . Arthritis   . Chronic insomnia   . Complication of anesthesia    severe itching night of surgery requiring meds- also couldnt swallow- throat "paralyzed""  . Fibromyalgia   . Glaucoma   . Hypercholesterolemia   . Hyperlipidemia   . Hypertension    PCP Dr Cari Caraway- [place]  05/15/11 with clearance and note on chart,    chest x ray, EKG 12/12 [facility], eccho 7/11 [facility]  . Hypothyroidism    s/p graves disease  . Kidney stone   . PD (Parkinson's disease) ([place])   . Peripheral vascular disease ([place])    PULMONARY EMBOLUS x 2- 2011, 2012/ FOLLOWED BY DR [id number]-LOV 12/12 [facility]   PT [facility] WILL STOP COUMADIN 3/12 and begin LOVONOX 05/17/11 as previously instructed  . Sinus infection    at present- dr Honor Loh PA aware- was seen there and at PCP 05/10/11  . Thyroid disease    Hypothyroidism    PAST SURGICAL HISTORY:   Past Surgical History:  Procedure Laterality Date  . ABDOMINAL HYSTERECTOMY    . APPENDECTOMY    . CATARACT EXTRACTION Bilateral   . CHOLECYSTECTOMY    . COLONOSCOPY    . EXPLORATORY LAPAROTOMY    . JOINT REPLACEMENT     left knee  7/12  . TOTAL KNEE ARTHROPLASTY  05/22/2011   Procedure: TOTAL KNEE ARTHROPLASTY;  Surgeon: Mauri Pole, MD;  Location: [facility];  Service: Orthopedics;  Laterality: Right;    SOCIAL HISTORY:   Social History   Social History  . Marital status: Widowed    Spouse name: N/A  . Number of children: 2  . Years of education:  N/A   Occupational History  . Not on file.   Social History Main Topics  . Smoking status: Former Smoker    Packs/day: 1.00    Quit date: 05/10/1989  . Smokeless tobacco: Never Used  . Alcohol use 0.0 oz/week     Comment: one drink once a month  . Drug use: No  . Sexual activity: Not on file   Other Topics Concern  . Not on file   Social History Narrative   Lives at home alone   Drinks caffeine occasionally     FAMILY HISTORY:   Family Status  Relation Status  . Mother Deceased at age 97   peritonitis  . Father Deceased at age 9   stroke, prostate cancer  . Sister Alive   10 siblings - 1 cerebral palsy  . Brother Alive    ROS:  A complete 10 system review of systems was obtained and was unremarkable apart from what is mentioned above.  PHYSICAL EXAMINATION:    VITALS:   Vitals:   01/25/16 0850  BP: 110/64  Pulse: (!) 108  Weight: 189 lb (85.7 kg)  Height: 5' 6.5" (1.689  m)   Wt Readings from Last 3 Encounters:  01/25/16 189 lb (85.7 kg)  01/21/16 182 lb (82.6 kg)  01/12/16 194 lb (88 kg)     GEN:  The patient appears stated age and is in NAD. HEENT:  Normocephalic, atraumatic.  CV:  RRR Lungs:  CTAB.   Neck/HEME:  There is a soft right carotid bruit.   There is decreased range of motion of the neck.  She has difficulty turning the head to the right.  Neurological examination:  Orientation:  Montreal Cognitive Assessment  01/25/2016  Visuospatial/ Executive (0/5) 2  Naming (0/3) 3  Attention: Read list of digits (0/2) 2  Attention: Read list of letters (0/1) 0  Attention: Serial 7 subtraction starting at 100 (0/3) 2  Language: Repeat phrase (0/2) 2  Language : Fluency (0/1) 0  Abstraction (0/2) 2  Delayed Recall (0/5) 5  Orientation (0/6) 5  Total 23  Adjusted Score (based on education) 23   Cranial nerves: There is good facial symmetry.  There is facial hypomimia.   The speech is fluent and clear.  There are no square wave jerks.  EOMI.    She has no difficulty with the guttural sounds.  Soft palate rises symmetrically and there is no tongue deviation. Hearing is intact to conversational tone. Sensation: Sensation is intact to light touch throughout. Motor: Strength is 5/5 in the bilateral upper and lower extremities.   Shoulder shrug is equal and symmetric.  There is no pronator drift.  There are no fasciculations noted.   Movement examination: Tone: There is normal tone in the bilateral upper extremities.  The tone in the lower extremities is normal.  Abnormal movements: None, even with distraction procedures Coordination:  There is minimal decreased toe taps on the L Gait and Station: The patient has no difficulty arising out of a deep-seated chair without the use of the hands.  Stride length is much decreased today with decreased arm swing.    ASSESSMENT/PLAN:  1.  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  Her DaT scan demonstrated decreased uptake of the tracer in the right putamen, but it was very mild.   I am not convinced that everything that she reports as "side effects" of levodopa are really side effects.  She thought that it made her fibromyalgia worse.  She has also thought that rytary 145 mg tid caused diplopia and she backed down to 95 mg 3 times per day and for a while she thought she did well with this, but is now stating that this is causing diplopia.  I have explained to her that this is not the cause of her diplopia.  Daughter reveals today that she does not think that her mother was even taking the medication correctly when she was living alone.  Patient currently in subacute rehabilitation.  I do not think she should go back to living alone, but I think she would be a great candidate for assisted living.  She has the resources to make this happen and we talked in detail about places that would be good for  her.  -falling backward and recommended a merry walker.  A prescription was written for that today.  I wrote a prescription for this last visit as well, but she did not get it.  -would recommend trying to get off ambien as many falls are in the  nighttime and her daughter thinks Ambien is contributing.  -neuropsych with Dr. Si Raider in 3 months.  Had some testing in hospital and recommended f/u in 3 months. 2.  Fall with fx of C5 spinous process  -did inpatient rehab stay after this.  Now doing subacute rehab  -talked to her about assisted living facilities.   2.  Fibromyalgia  -seeing Dr. Letta Pate 3.  R carotid bruit  -had carotid u/s in 10/2015 that was normal.  Will reorder.   4.  .Much greater than 50% of this visit was spent in counseling with the patient.  Total face to face time:  40 min

## 2016-01-25 ENCOUNTER — Encounter: Payer: Self-pay | Admitting: Neurology

## 2016-01-25 ENCOUNTER — Ambulatory Visit (INDEPENDENT_AMBULATORY_CARE_PROVIDER_SITE_OTHER): Payer: Medicare Other | Admitting: Neurology

## 2016-01-25 VITALS — BP 110/64 | HR 108 | Ht 66.5 in | Wt 189.0 lb

## 2016-01-25 DIAGNOSIS — I749 Embolism and thrombosis of unspecified artery: Secondary | ICD-10-CM

## 2016-01-25 DIAGNOSIS — G232 Striatonigral degeneration: Secondary | ICD-10-CM

## 2016-01-25 MED ORDER — AMBULATORY NON FORMULARY MEDICATION: 1.0000 | Freq: Every day | 0 refills | Status: DC

## 2016-01-25 NOTE — Patient Instructions (Signed)
Well schedule Neuro Cognitive testing with Dr. Si Raider for the end of January.   We will see you back in March.   Happy Holidays!

## 2016-02-01 ENCOUNTER — Telehealth: Payer: Self-pay | Admitting: *Deleted

## 2016-02-01 NOTE — Telephone Encounter (Signed)
[name] [name] from [facility] called to let us know that they are not allowed to use Angela Nevin walkers since they are considered a restraint.  PT evaluated patient and said that she could use a rollator.   Please advise.   [phone] ext. [phone]

## 2016-02-01 NOTE — Telephone Encounter (Signed)
Are you kidding?  A walker with a seat is a restraint?  She is a high fall risk with a rollator, so unless someone is walking with her, I don't advise it.

## 2016-02-03 DIAGNOSIS — R2681 Unsteadiness on feet: Secondary | ICD-10-CM | POA: Diagnosis not present

## 2016-02-03 DIAGNOSIS — M542 Cervicalgia: Secondary | ICD-10-CM | POA: Diagnosis not present

## 2016-02-03 DIAGNOSIS — M797 Fibromyalgia: Secondary | ICD-10-CM | POA: Diagnosis not present

## 2016-02-03 DIAGNOSIS — S129XXA Fracture of neck, unspecified, initial encounter: Secondary | ICD-10-CM | POA: Diagnosis not present

## 2016-02-03 DIAGNOSIS — G8929 Other chronic pain: Secondary | ICD-10-CM | POA: Diagnosis not present

## 2016-02-03 DIAGNOSIS — M25511 Pain in right shoulder: Secondary | ICD-10-CM | POA: Diagnosis not present

## 2016-02-03 NOTE — Telephone Encounter (Signed)
Please call.

## 2016-02-03 NOTE — Telephone Encounter (Signed)
Spoke with Kathlee Nations and made her aware if patient is up walking she will need someone with her if she can not use a Museum/gallery conservator.

## 2016-02-04 ENCOUNTER — Encounter: Payer: Self-pay | Admitting: Adult Health

## 2016-02-04 ENCOUNTER — Non-Acute Institutional Stay (SKILLED_NURSING_FACILITY): Payer: Medicare Other | Admitting: Adult Health

## 2016-02-04 DIAGNOSIS — E876 Hypokalemia: Secondary | ICD-10-CM

## 2016-02-04 DIAGNOSIS — G20 Parkinson's disease: Secondary | ICD-10-CM

## 2016-02-04 DIAGNOSIS — G4700 Insomnia, unspecified: Secondary | ICD-10-CM | POA: Diagnosis not present

## 2016-02-04 DIAGNOSIS — I10 Essential (primary) hypertension: Secondary | ICD-10-CM

## 2016-02-04 DIAGNOSIS — M797 Fibromyalgia: Secondary | ICD-10-CM | POA: Diagnosis not present

## 2016-02-04 DIAGNOSIS — E038 Other specified hypothyroidism: Secondary | ICD-10-CM

## 2016-02-04 DIAGNOSIS — S12490S Other displaced fracture of fifth cervical vertebra, sequela: Secondary | ICD-10-CM | POA: Diagnosis not present

## 2016-02-04 DIAGNOSIS — E784 Other hyperlipidemia: Secondary | ICD-10-CM

## 2016-02-04 DIAGNOSIS — R531 Weakness: Secondary | ICD-10-CM | POA: Diagnosis not present

## 2016-02-04 DIAGNOSIS — M069 Rheumatoid arthritis, unspecified: Secondary | ICD-10-CM

## 2016-02-04 DIAGNOSIS — Z86711 Personal history of pulmonary embolism: Secondary | ICD-10-CM

## 2016-02-04 DIAGNOSIS — E7849 Other hyperlipidemia: Secondary | ICD-10-CM

## 2016-02-04 NOTE — [place] Notes (Signed)
DATE:  02/04/2016   MRN:  [id number]  BIRTHDAY: [birth year]  Facility:  Nursing Home Location:  [place] [facility] [place] Room Number: 308-P  LEVEL OF CARE:  SNF (31)  Contact Information    Name Relation Home Work [place] Daughter [phone]  [phone]   Allysha, Hodgson [phone]     Marylou Mccoy [phone]         Code Status History    Date Active Date Inactive Code Status Order ID Comments User Context   12/20/2015  6:48 PM 01/01/2016  2:00 PM DNR [id number]  Cathlyn Parsons, PA-C Inpatient   12/17/2015  4:16 PM 12/20/2015  6:22 PM DNR [id number]  Samella Parr, NP Inpatient   05/22/2011  9:18 PM 05/24/2011  6:19 PM Full Code [id number]  July Dizon Pricilla Holm, RN Inpatient    Questions for Most Recent Historical Code Status (Order [id number])    Question Answer Comment   In the event of cardiac or respiratory ARREST Do not call a "code blue"    In the event of cardiac or respiratory ARREST Do not perform Intubation, CPR, defibrillation or ACLS    In the event of cardiac or respiratory ARREST Use medication by any route, position, wound care, and other measures to relive pain and suffering. May use oxygen, suction and manual treatment of airway obstruction as needed for comfort.         Advance Directive Documentation   Flowsheet Row Most Recent Value  Type of Advance Directive  Out of facility DNR (pink MOST or yellow form)  Pre-existing out of facility DNR order (yellow form or pink MOST form)  No data  "MOST" Form in Place?  No data       Chief Complaint  Patient presents with  . Discharge Note    HISTORY OF PRESENT ILLNESS:  This is a 70 year old female who is for discharge home with Home health PT, OT, CNA and Nursing.  She has been admitted to [facility] on 01/11/16 from [place] admission 12/20/15 thru 01/01/16 post fall sustaining displaced C5 fracture. Neurosurgery was  consulted and recommended cervical collar and pain management. She has PMH of Pulmonary emboli, Parkinson's disease and fibromyalgia.  Patient was admitted to this facility for short-term rehabilitation after the patient's recent hospitalization.  Patient has completed SNF rehabilitation and therapy has cleared the patient for discharge.   PAST MEDICAL HISTORY:  Past Medical History:  Diagnosis Date  . Arthritis   . Chronic insomnia   . Complication of anesthesia    severe itching night of surgery requiring meds- also couldnt swallow- throat "paralyzed""  . Fibromyalgia   . Glaucoma   . Hypercholesterolemia   . Hyperlipidemia   . Hypertension    PCP Dr Cari Caraway- [place]  05/15/11 with clearance and note on chart,    chest x ray, EKG 12/12 EPIC, eccho 7/11 EPIC  . Hypothyroidism    s/p graves disease  . Kidney stone   . PD (Parkinson's disease) ([place])   . Peripheral vascular disease ([place])    PULMONARY EMBOLUS x 2- 2011, 2012/ FOLLOWED BY DR ODOGWU-LOV 12/12 EPIC   PT STAES WILL STOP COUMADIN 3/12 and begin LOVONOX 05/17/11 as previously instructed  . Sinus infection    at present- dr Honor Loh PA aware- was seen there and at PCP 05/10/11  . Thyroid disease    Hypothyroidism     CURRENT MEDICATIONS: Reviewed  Patient's  Medications  New Prescriptions   No medications on file  Previous Medications   ACETAMINOPHEN (TYLENOL) 500 MG TABLET    Take 500 mg by mouth every 6 (six) hours as needed for mild pain.   ASPIRIN 81 MG TABLET    Take 81 mg by mouth daily after breakfast.    ATORVASTATIN (LIPITOR) 10 MG TABLET    Take 1 tablet (10 mg total) by mouth daily.   AZELASTINE (ASTELIN) 0.1 % NASAL SPRAY    Place 1 spray into both nostrils 2 (two) times daily. Use in each nostril as directed   CARBIDOPA-LEVODOPA ER (RYTARY) 23.75-95 MG CPCR    Take 1 tablet by mouth 3 (three) times daily.   CELECOXIB (CELEBREX) 100 MG CAPSULE    Take 1 capsule (100 mg total) by mouth 2 (two) times daily.    CHOLECALCIFEROL (VITAMIN D3) 5000 UNITS CAPS    Take 5,000 Units by mouth daily.    CYANOCOBALAMIN 500 MCG TABLET    Take 500 mcg by mouth daily.   DULOXETINE (CYMBALTA) 60 MG CAPSULE    Take 1 capsule (60 mg total) by mouth daily.   MAGNESIUM OXIDE (MAG-OX) 400 (241.3 MG) MG TABLET    Take 0.5 tablets (200 mg total) by mouth daily.   MENTHOL, TOPICAL ANALGESIC, (BIOFREEZE EX)    Apply 1 application topically 4 (four) times daily. Apply to affected area.   METHOCARBAMOL (ROBAXIN) 500 MG TABLET    Take 500 mg by mouth every 6 (six) hours as needed for muscle spasms.   OMEGA-3 ACID ETHYL ESTERS (LOVAZA) 1 G CAPSULE    Take 1 g by mouth daily.    OXYCODONE HCL 10 MG TABS    Take by mouth every 8 (eight) hours as needed.    POTASSIUM CHLORIDE SA (K-DUR,KLOR-CON) 20 MEQ TABLET    Take 1 tablet (20 mEq total) by mouth daily.   SYNTHROID 150 MCG TABLET    Take 1 tablet (150 mcg total) by mouth daily before breakfast.   TELMISARTAN-HYDROCHLOROTHIAZIDE (MICARDIS HCT) 40-12.5 MG PER TABLET    Take 1 tablet by mouth daily.    ZOLPIDEM (AMBIEN) 5 MG TABLET    Take 5 mg by mouth at bedtime.  Modified Medications   No medications on file  Discontinued Medications   AMBULATORY NON FORMULARY MEDICATION    1 Device by Does not apply route daily. Merry Walker   WARFARIN (COUMADIN) 3 MG TABLET    Take 1 tablet (3 mg total) by mouth daily at 6 PM.     Allergies  Allergen Reactions  . Crestor [Rosuvastatin Calcium] Other (See Comments)    Feeling very bad, aching all over.  . Erythromycin Hives  . Gabapentin Other (See Comments)    Blurred vision  . Pregabalin Other (See Comments)    Crossed vision.  . Carbidopa Other (See Comments)    Headaches, nausea, dizziness  . Macrobid [Nitrofurantoin] Hives  . Losartan Other (See Comments)     REVIEW OF SYSTEMS:  GENERAL: no change in appetite, no fatigue, no weight changes, no fever, chills or weakness EYES: Denies change in vision, dry eyes, eye pain,  itching or discharge EARS: Denies change in hearing, ringing in ears, or earache NOSE: Denies nasal congestion or epistaxis MOUTH and THROAT: Denies oral discomfort, gingival pain or bleeding, pain from teeth or hoarseness   RESPIRATORY: no cough, SOB, DOE, wheezing, hemoptysis CARDIAC: no chest pain, edema or palpitations GI: no abdominal pain, diarrhea, constipation, heart burn, nausea  or vomiting GU: Denies dysuria, frequency, hematuria, incontinence, or discharge PSYCHIATRIC: Denies feeling of depression or anxiety. No report of hallucinations, insomnia, paranoia, or agitation     PHYSICAL EXAMINATION  GENERAL APPEARANCE: Well nourished. In no acute distress. Obese SKIN:  Skin is warm and dry.  HEAD: Normal in size and contour. No evidence of trauma EYES: Lids open and close normally. No blepharitis, entropion or ectropion. PERRL. Conjunctivae are clear and sclerae are white. Lenses are without opacity EARS: Pinnae are normal. Patient hears normal voice tunes of the examiner MOUTH and THROAT: Lips are without lesions. Oral mucosa is moist and without lesions. Tongue is normal in shape, size, and color and without lesions NECK: supple, trachea midline, no neck masses, no thyroid tenderness, no thyromegaly LYMPHATICS: no LAN in the neck, no supraclavicular LAN RESPIRATORY: breathing is even & unlabored, BS CTAB CARDIAC: RRR, no murmur,no extra heart sounds, no edema GI: abdomen soft, normal BS, no masses, no tenderness, no hepatomegaly, no splenomegaly EXTREMITIES:  Able to move X 4 extremities PSYCHIATRIC: Alert and oriented X 3. Affect and behavior are appropriate   LABS/RADIOLOGY: Labs reviewed: Basic Metabolic Panel:  Recent Labs  12/21/15 0630 12/27/15 0509 01/10/16 1207 01/17/16  NA 138 138 140 145  K 3.3* 4.2 3.7 4.5  CL 100* 103 103  --   CO2 28 29 27   --   GLUCOSE 134* 130* 123*  --   BUN 11 13 15 16   CREATININE 0.62 0.71 0.77 0.6  CALCIUM 9.1 9.2 10.3  --     Liver Function Tests:  Recent Labs  11/24/15 1103 12/21/15 0630 01/10/16 1207 01/17/16  AST 22 39 27 26  ALT 14 65* 14 14  ALKPHOS 79 91 156* 121  BILITOT 0.5 1.3* 0.8  --   PROT 6.5 6.0* 7.3  --   ALBUMIN 3.5 3.1* 4.2  --     CBC:  Recent Labs  12/17/15 0738  12/21/15 0630 12/27/15 0509 01/10/16 1207 01/17/16  WBC 17.4*  < > 10.1 9.1 10.8* 7.9  NEUTROABS 15.4*  --  6.5  --   --   --   HGB 14.0  < > 13.2 12.9 14.9 13.5  HCT 42.4  < > 39.1 40.0 44.7 42  MCV 88.1  < > 86.9 89.7 87.8  --   PLT 258  < > 321 388 437.0* 308  < > = values in this interval not displayed.   Lipid Panel:  Recent Labs  11/24/15 1103  HDL 58.90    ASSESSMENT/PLAN:  Generalized weakness - for home health PT and OT, for therapeutic strengthening exercises; fall precautions  C5 fracture - neurosurgery recommended cervical collar and pain management, will have home health PT and OT to continue therapeutic strengthening exercises; continue Tylenol Extra Strength 500 mg 1 tab by mouth every 6 hours when necessary and oxycodone 10 mg 1 tab by mouth every 8 hours when necessary for pain; Robaxin 500 mg 1 tab by mouth every 6 hours when necessary for muscle spasm; follow-up with neurosurgery  Hx of pulmonary embolism - no SOB; continue Coumadin  Fibromyalgia - continue Cymbalta 60 mg 1 capsule by mouth daily  Essential hypertension - well-controlled; continue micardis hct 14-12 0.5 mg 1 tab by mouth daily   Hypokalemia - continue KCl ER 20 meq 1 tab by mouth daily  Insomnia - continue Ambien 5 mg 1 tab by mouth daily at bedtime  Rheumatoid arthritis - continue Celebrex 100 mg 1 capsule by  mouth twice a day and Biofreeze 4% gel topically to affected area 4 times a day  Hyperlipidemia - continue Lipitor 10 mg 1 tab by mouth daily at bedtime and Lovaza 1 gm 1 capsule PO Q HS  Hypomagnesemia - continue Magnesium oxide 400 mg 1/2 tab = 200 mg PO Q HS   Parkinson's disease - continue Rytary ER  23.75-95 mg 1 capsule PO TID  Hypothyroidism - continue Synthroid 150 g 1 tab by mouth daily    I have filled out patient's discharge paperwork and written prescriptions.  Patient will receive home health PT, OT, Nursing and CNA.  DME provided:  None  Total discharge time: Greater than 30 minutes Greater than 50% was spent in counseling and coordination of care.  Discharge time involved coordination of the discharge process with social worker, nursing staff and therapy department. Medical justification for home health services verified.    Monina C. [place]  - NP [employer] [phone]

## 2016-02-08 ENCOUNTER — Encounter: Payer: Self-pay | Admitting: Family Medicine

## 2016-02-08 NOTE — Telephone Encounter (Signed)
error:[id number] ° °

## 2016-02-10 ENCOUNTER — Telehealth: Payer: Self-pay | Admitting: Family Medicine

## 2016-02-10 DIAGNOSIS — G20 Parkinson's disease: Secondary | ICD-10-CM | POA: Diagnosis not present

## 2016-02-10 DIAGNOSIS — I739 Peripheral vascular disease, unspecified: Secondary | ICD-10-CM | POA: Diagnosis not present

## 2016-02-10 DIAGNOSIS — M797 Fibromyalgia: Secondary | ICD-10-CM | POA: Diagnosis not present

## 2016-02-10 DIAGNOSIS — S12490D Other displaced fracture of fifth cervical vertebra, subsequent encounter for fracture with routine healing: Secondary | ICD-10-CM | POA: Diagnosis not present

## 2016-02-10 DIAGNOSIS — I10 Essential (primary) hypertension: Secondary | ICD-10-CM | POA: Diagnosis not present

## 2016-02-10 DIAGNOSIS — M1990 Unspecified osteoarthritis, unspecified site: Secondary | ICD-10-CM | POA: Diagnosis not present

## 2016-02-10 NOTE — Telephone Encounter (Signed)
[name] [name] with [facility] called to report patient's INR and PT results.   INR- 1.3 PT-15.6 Patient is currently on 3mg  coumadin everyday except Monday. Monday she takes 4mg   Phone: [phone] (leave a message)

## 2016-02-11 ENCOUNTER — Telehealth: Payer: Self-pay

## 2016-02-11 ENCOUNTER — Telehealth: Payer: Self-pay | Admitting: Family Medicine

## 2016-02-11 NOTE — Telephone Encounter (Signed)
[facility] nurse, Esaw Dace, is calling to request patient's PT and INR results from 02/10/16. She would like to know if they could please be faxed. Please advise  Dewaine Oats phone: [phone] Fax: [phone]

## 2016-02-11 NOTE — Telephone Encounter (Signed)
She is taking 3x6= 18 and 4= 22 mg a week Will increase by 2 mg/w- have her take 4 mg 3 days a week and 3 mg 4 days a week = 24 mg.  Recheck in 1 week.  Called Ebony and left detailed message

## 2016-02-11 NOTE — Telephone Encounter (Signed)
Called Dewaine Oats (Nurse at [employer]) to inform that I don't have an actual report to fax over but the follow report was called in to our office on 02/10/16.  Ebony with [facility] called to report patient's INR and PT results.   INR- 1.3 PT-15.6 Patient is currently on 3mg  coumadin everyday except Monday. Monday she takes 4mg   Phone: [phone] (leave a message)  [name] [name] that she may be able to give [name] [name] a call to have her fax over a copy of the report.

## 2016-02-12 DIAGNOSIS — G20 Parkinson's disease: Secondary | ICD-10-CM | POA: Diagnosis not present

## 2016-02-12 DIAGNOSIS — S12490D Other displaced fracture of fifth cervical vertebra, subsequent encounter for fracture with routine healing: Secondary | ICD-10-CM | POA: Diagnosis not present

## 2016-02-12 DIAGNOSIS — M1990 Unspecified osteoarthritis, unspecified site: Secondary | ICD-10-CM | POA: Diagnosis not present

## 2016-02-12 DIAGNOSIS — M797 Fibromyalgia: Secondary | ICD-10-CM | POA: Diagnosis not present

## 2016-02-12 DIAGNOSIS — I10 Essential (primary) hypertension: Secondary | ICD-10-CM | POA: Diagnosis not present

## 2016-02-12 DIAGNOSIS — I739 Peripheral vascular disease, unspecified: Secondary | ICD-10-CM | POA: Diagnosis not present

## 2016-02-14 ENCOUNTER — Telehealth: Payer: Self-pay | Admitting: Family Medicine

## 2016-02-14 ENCOUNTER — Ambulatory Visit: Payer: Medicare Other | Admitting: Physical Medicine & Rehabilitation

## 2016-02-14 NOTE — Telephone Encounter (Signed)
Returned Rolland's call to approve verbal orders for pt. Verbal order given for "twice a week for 4 weeks balance and coordination and pain management", as requested.

## 2016-02-14 NOTE — Telephone Encounter (Signed)
[facility] called today to inform Dr. Lorelei Pont that patient refused social worker visit today.   Phone: [phone]

## 2016-02-14 NOTE — Telephone Encounter (Signed)
Caller name: Unknown Jim  Relation to pt: PT from [facility]  Call back number: [phone]    Reason for call:  Requesting verbal orders for twice a week for 4 weeks balanace and coordination and pain management, please advise

## 2016-02-15 ENCOUNTER — Telehealth: Payer: Self-pay | Admitting: Family Medicine

## 2016-02-15 ENCOUNTER — Encounter: Payer: Medicare Other | Attending: Physical Medicine & Rehabilitation

## 2016-02-15 ENCOUNTER — Ambulatory Visit: Payer: Medicare Other | Admitting: Physical Medicine & Rehabilitation

## 2016-02-15 DIAGNOSIS — F5104 Psychophysiologic insomnia: Secondary | ICD-10-CM | POA: Insufficient documentation

## 2016-02-15 DIAGNOSIS — M797 Fibromyalgia: Secondary | ICD-10-CM | POA: Diagnosis not present

## 2016-02-15 DIAGNOSIS — Z87891 Personal history of nicotine dependence: Secondary | ICD-10-CM | POA: Insufficient documentation

## 2016-02-15 DIAGNOSIS — I739 Peripheral vascular disease, unspecified: Secondary | ICD-10-CM | POA: Diagnosis not present

## 2016-02-15 DIAGNOSIS — H409 Unspecified glaucoma: Secondary | ICD-10-CM | POA: Insufficient documentation

## 2016-02-15 DIAGNOSIS — G20 Parkinson's disease: Secondary | ICD-10-CM | POA: Diagnosis not present

## 2016-02-15 DIAGNOSIS — S12490D Other displaced fracture of fifth cervical vertebra, subsequent encounter for fracture with routine healing: Secondary | ICD-10-CM | POA: Diagnosis not present

## 2016-02-15 DIAGNOSIS — E079 Disorder of thyroid, unspecified: Secondary | ICD-10-CM | POA: Insufficient documentation

## 2016-02-15 DIAGNOSIS — Z9889 Other specified postprocedural states: Secondary | ICD-10-CM | POA: Insufficient documentation

## 2016-02-15 DIAGNOSIS — I10 Essential (primary) hypertension: Secondary | ICD-10-CM | POA: Diagnosis not present

## 2016-02-15 DIAGNOSIS — E7800 Pure hypercholesterolemia, unspecified: Secondary | ICD-10-CM | POA: Insufficient documentation

## 2016-02-15 DIAGNOSIS — M1990 Unspecified osteoarthritis, unspecified site: Secondary | ICD-10-CM | POA: Diagnosis not present

## 2016-02-15 NOTE — Telephone Encounter (Signed)
Caller name: Vladimir Creeks Relationship to patient: [facility] Can be reached: [phone] Pharmacy:  Reason for call: [facility] needs an order for OT 2 times a week for 4 weeks for safety training, strengthening, ADL's and transfers.

## 2016-02-15 NOTE — Telephone Encounter (Signed)
Called and gave order.  Advised that I do not know if she is still under cervical procautions

## 2016-02-16 DIAGNOSIS — I739 Peripheral vascular disease, unspecified: Secondary | ICD-10-CM | POA: Diagnosis not present

## 2016-02-16 DIAGNOSIS — G20 Parkinson's disease: Secondary | ICD-10-CM | POA: Diagnosis not present

## 2016-02-16 DIAGNOSIS — M1990 Unspecified osteoarthritis, unspecified site: Secondary | ICD-10-CM | POA: Diagnosis not present

## 2016-02-16 DIAGNOSIS — M797 Fibromyalgia: Secondary | ICD-10-CM | POA: Diagnosis not present

## 2016-02-16 DIAGNOSIS — S12490D Other displaced fracture of fifth cervical vertebra, subsequent encounter for fracture with routine healing: Secondary | ICD-10-CM | POA: Diagnosis not present

## 2016-02-16 DIAGNOSIS — I10 Essential (primary) hypertension: Secondary | ICD-10-CM | POA: Diagnosis not present

## 2016-02-17 DIAGNOSIS — I10 Essential (primary) hypertension: Secondary | ICD-10-CM | POA: Diagnosis not present

## 2016-02-17 DIAGNOSIS — G20 Parkinson's disease: Secondary | ICD-10-CM | POA: Diagnosis not present

## 2016-02-17 DIAGNOSIS — M1990 Unspecified osteoarthritis, unspecified site: Secondary | ICD-10-CM | POA: Diagnosis not present

## 2016-02-17 DIAGNOSIS — M797 Fibromyalgia: Secondary | ICD-10-CM | POA: Diagnosis not present

## 2016-02-17 DIAGNOSIS — I739 Peripheral vascular disease, unspecified: Secondary | ICD-10-CM | POA: Diagnosis not present

## 2016-02-17 DIAGNOSIS — S12490D Other displaced fracture of fifth cervical vertebra, subsequent encounter for fracture with routine healing: Secondary | ICD-10-CM | POA: Diagnosis not present

## 2016-02-21 DIAGNOSIS — M797 Fibromyalgia: Secondary | ICD-10-CM | POA: Diagnosis not present

## 2016-02-21 DIAGNOSIS — M1990 Unspecified osteoarthritis, unspecified site: Secondary | ICD-10-CM | POA: Diagnosis not present

## 2016-02-21 DIAGNOSIS — I739 Peripheral vascular disease, unspecified: Secondary | ICD-10-CM | POA: Diagnosis not present

## 2016-02-21 DIAGNOSIS — S12490D Other displaced fracture of fifth cervical vertebra, subsequent encounter for fracture with routine healing: Secondary | ICD-10-CM | POA: Diagnosis not present

## 2016-02-21 DIAGNOSIS — I10 Essential (primary) hypertension: Secondary | ICD-10-CM | POA: Diagnosis not present

## 2016-02-21 DIAGNOSIS — G20 Parkinson's disease: Secondary | ICD-10-CM | POA: Diagnosis not present

## 2016-02-22 DIAGNOSIS — M1990 Unspecified osteoarthritis, unspecified site: Secondary | ICD-10-CM | POA: Diagnosis not present

## 2016-02-22 DIAGNOSIS — G20 Parkinson's disease: Secondary | ICD-10-CM | POA: Diagnosis not present

## 2016-02-22 DIAGNOSIS — M797 Fibromyalgia: Secondary | ICD-10-CM | POA: Diagnosis not present

## 2016-02-22 DIAGNOSIS — I739 Peripheral vascular disease, unspecified: Secondary | ICD-10-CM | POA: Diagnosis not present

## 2016-02-22 DIAGNOSIS — S12490D Other displaced fracture of fifth cervical vertebra, subsequent encounter for fracture with routine healing: Secondary | ICD-10-CM | POA: Diagnosis not present

## 2016-02-22 DIAGNOSIS — I10 Essential (primary) hypertension: Secondary | ICD-10-CM | POA: Diagnosis not present

## 2016-02-23 DIAGNOSIS — I739 Peripheral vascular disease, unspecified: Secondary | ICD-10-CM | POA: Diagnosis not present

## 2016-02-23 DIAGNOSIS — G20 Parkinson's disease: Secondary | ICD-10-CM | POA: Diagnosis not present

## 2016-02-23 DIAGNOSIS — M797 Fibromyalgia: Secondary | ICD-10-CM | POA: Diagnosis not present

## 2016-02-23 DIAGNOSIS — I10 Essential (primary) hypertension: Secondary | ICD-10-CM | POA: Diagnosis not present

## 2016-02-23 DIAGNOSIS — S12490D Other displaced fracture of fifth cervical vertebra, subsequent encounter for fracture with routine healing: Secondary | ICD-10-CM | POA: Diagnosis not present

## 2016-02-23 DIAGNOSIS — M1990 Unspecified osteoarthritis, unspecified site: Secondary | ICD-10-CM | POA: Diagnosis not present

## 2016-02-24 ENCOUNTER — Telehealth: Payer: Self-pay | Admitting: Family Medicine

## 2016-02-24 DIAGNOSIS — M1990 Unspecified osteoarthritis, unspecified site: Secondary | ICD-10-CM | POA: Diagnosis not present

## 2016-02-24 DIAGNOSIS — S12490D Other displaced fracture of fifth cervical vertebra, subsequent encounter for fracture with routine healing: Secondary | ICD-10-CM | POA: Diagnosis not present

## 2016-02-24 DIAGNOSIS — I10 Essential (primary) hypertension: Secondary | ICD-10-CM | POA: Diagnosis not present

## 2016-02-24 DIAGNOSIS — I739 Peripheral vascular disease, unspecified: Secondary | ICD-10-CM | POA: Diagnosis not present

## 2016-02-24 DIAGNOSIS — M797 Fibromyalgia: Secondary | ICD-10-CM | POA: Diagnosis not present

## 2016-02-24 DIAGNOSIS — G20 Parkinson's disease: Secondary | ICD-10-CM | POA: Diagnosis not present

## 2016-02-24 NOTE — Telephone Encounter (Signed)
[name] [name] from [facility] called INR today was 1.3 She is taking 4mg  4d, 3 mg 3d weekly coumadin  16 + 9 = 25 mg total a week  Will increase by 3mg  to 4 mg every day- recheck INR in one week.   [name] states understanding

## 2016-02-25 NOTE — Telephone Encounter (Signed)
Closing fmla ppwrk encounter

## 2016-03-01 DIAGNOSIS — I10 Essential (primary) hypertension: Secondary | ICD-10-CM | POA: Diagnosis not present

## 2016-03-01 DIAGNOSIS — M1990 Unspecified osteoarthritis, unspecified site: Secondary | ICD-10-CM | POA: Diagnosis not present

## 2016-03-01 DIAGNOSIS — S12490D Other displaced fracture of fifth cervical vertebra, subsequent encounter for fracture with routine healing: Secondary | ICD-10-CM | POA: Diagnosis not present

## 2016-03-01 DIAGNOSIS — M797 Fibromyalgia: Secondary | ICD-10-CM | POA: Diagnosis not present

## 2016-03-01 DIAGNOSIS — I739 Peripheral vascular disease, unspecified: Secondary | ICD-10-CM | POA: Diagnosis not present

## 2016-03-01 DIAGNOSIS — G20 Parkinson's disease: Secondary | ICD-10-CM | POA: Diagnosis not present

## 2016-03-02 ENCOUNTER — Encounter: Payer: Self-pay | Admitting: Family Medicine

## 2016-03-02 ENCOUNTER — Ambulatory Visit (INDEPENDENT_AMBULATORY_CARE_PROVIDER_SITE_OTHER): Payer: Medicare Other | Admitting: Family Medicine

## 2016-03-02 ENCOUNTER — Telehealth: Payer: Self-pay | Admitting: Behavioral Health

## 2016-03-02 VITALS — BP 110/65 | HR 90 | Temp 97.9°F | Ht 66.5 in | Wt 196.8 lb

## 2016-03-02 DIAGNOSIS — M1990 Unspecified osteoarthritis, unspecified site: Secondary | ICD-10-CM | POA: Diagnosis not present

## 2016-03-02 DIAGNOSIS — M797 Fibromyalgia: Secondary | ICD-10-CM | POA: Diagnosis not present

## 2016-03-02 DIAGNOSIS — S12490D Other displaced fracture of fifth cervical vertebra, subsequent encounter for fracture with routine healing: Secondary | ICD-10-CM | POA: Diagnosis not present

## 2016-03-02 DIAGNOSIS — Z1211 Encounter for screening for malignant neoplasm of colon: Secondary | ICD-10-CM

## 2016-03-02 DIAGNOSIS — D6859 Other primary thrombophilia: Secondary | ICD-10-CM | POA: Diagnosis not present

## 2016-03-02 DIAGNOSIS — Z7901 Long term (current) use of anticoagulants: Secondary | ICD-10-CM

## 2016-03-02 DIAGNOSIS — I749 Embolism and thrombosis of unspecified artery: Secondary | ICD-10-CM

## 2016-03-02 DIAGNOSIS — F5104 Psychophysiologic insomnia: Secondary | ICD-10-CM | POA: Diagnosis not present

## 2016-03-02 DIAGNOSIS — I739 Peripheral vascular disease, unspecified: Secondary | ICD-10-CM | POA: Diagnosis not present

## 2016-03-02 DIAGNOSIS — G20 Parkinson's disease: Secondary | ICD-10-CM | POA: Diagnosis not present

## 2016-03-02 DIAGNOSIS — I10 Essential (primary) hypertension: Secondary | ICD-10-CM | POA: Diagnosis not present

## 2016-03-02 NOTE — [place] Notes (Signed)
[facility] [address], [place], [place] [address] [phone] [phone]  Date:  03/02/2016   Name:  [name] [name]   DOB:  [birth year]   MRN:  [id number]  PCP:  Lamar Blinks, MD    Chief Complaint: No chief complaint on file.   History of Present Illness:  [name] [name] is a 70 y.o. very pleasant female patient who presents with the following:  Here today for a follow-up visit Last seen here in Early November with the following HPI:  Here today for a hospital follow-up- she had a fall and resulting cervical fracture last month She does have parkinson's disease so she has some physical debility at baseline Here today with her daughter Leonides Schanz has noted that her mom has been more confused since her fall.  She is living with her daughter right now.   They are in contact with an MSW right now who is trying to find placement for her.  [name] has 3 children herself including an infant and a child with special needs- she is not able to provide long term in home care for her mom at this time.   She was in the hospital from 10/13 to 10/16, then inpt rehab from 10/16 to 10/28.   She had gotten up in the night, got out of bed and fell down some stairs- she fractured her C5 and is in a collar.  No operation needed.  She is seeing NSG this week     Wt Readings from Last 3 Encounters:  01/10/16 186 lb 12.8 oz (84.7 kg)  12/29/15 194 lb 9.6 oz (88.3 kg)  12/17/15 195 lb (88.5 kg)   No HA or vomiting   Her daughter has noted more issues with incont-  She has some urine leakage at baseline, but this has been worse for the last couple of weeks.  [name] is having frequent urinary accidence which she cannot seem to control.  [name] wonders if she might have a UTi She has not noted any evidence of pneumonia such as fever, cough  She moved to [place] earlier this month.  She is liking this so far- she has her own appt. They  distribute medications and she eats in the dining room.  She does her own housekeeping, bathing, etc.  Got a call from her home health person today- Caryl Pina- as follows:  CallerCaryl Pina, nurse with [facility] [phone] Reason for Call: Critical Lab Value She reported the following results and requesting further recommendation for dosage of Coumadin: PT 23 & INR 1.9.  At last check on 12/21 her INR was 1.3. We increased her dosage to 4 mg daily.    Called Ashely back, no answer at this time.   She has 4 mg tablets available  Will increase her dose by 10%.  Current dose 4x7 = 28 mg.  Will have her take 6 mg one day and 4 mg the other days for a total of 30 mg a week  She notes some difficulty sleeping.  She may not be able to go to sleep until around 2 am and often wakes up around 9 am.  She may take a nap during the day.  She does have parkinson's disease and had a fall in October- she fell backwards, hit a wall and sustained a cervical practure She got out of her cervical collar at her last ortho follow-up She is still taking her pain medication once  a day generally for persistent neck pain   BP Readings from Last 3 Encounters:  03/02/16 (!) 109/51  02/04/16 122/71  01/25/16 110/64   Flu shot done for this year.    She did have a pneumonia shot at age 62 per [facility]- called them, she is actually UTD on her 28 and 24, and her tdap She is due a mammogram She is due for a colonoscopy -was last done through [facility].    Lab Results  Component Value Date   TSH 0.62 01/10/2016    Patient Active Problem List   Diagnosis Date Noted  . Benign essential HTN   . Intractable pain 12/17/2015  . Displaced fracture of fifth cervical vertebra ([place]) 12/17/2015  . Gait disturbance 12/17/2015  . Trochanteric bursitis of both hips 09/13/2015  . Chronic low back pain 06/07/2015  . Fibromyalgia syndrome 05/17/2015  . Hypothyroidism 05/17/2015  . Parkinson's disease ([place])  05/17/2015  . Hypercoagulable state ([place]) 02/07/2012  . History of pulmonary embolism 02/07/2012  . S/P right TKA 05/22/2011    Past Medical History:  Diagnosis Date  . Arthritis   . Chronic insomnia   . Complication of anesthesia    severe itching night of surgery requiring meds- also couldnt swallow- throat "paralyzed""  . Fibromyalgia   . Glaucoma   . Hypercholesterolemia   . Hyperlipidemia   . Hypertension    PCP Dr Cari Caraway- [place]  05/15/11 with clearance and note on chart,    chest x ray, EKG 12/12 EPIC, eccho 7/11 EPIC  . Hypothyroidism    s/p graves disease  . Kidney stone   . PD (Parkinson's disease) ([place])   . Peripheral vascular disease ([place])    PULMONARY EMBOLUS x 2- 2011, 2012/ FOLLOWED BY DR ODOGWU-LOV 12/12 EPIC   PT STAES WILL STOP COUMADIN 3/12 and begin LOVONOX 05/17/11 as previously instructed  . Sinus infection    at present- dr Honor Loh PA aware- was seen there and at PCP 05/10/11  . Thyroid disease    Hypothyroidism    Past Surgical History:  Procedure Laterality Date  . ABDOMINAL HYSTERECTOMY    . APPENDECTOMY    . CATARACT EXTRACTION Bilateral   . CHOLECYSTECTOMY    . COLONOSCOPY    . EXPLORATORY LAPAROTOMY    . JOINT REPLACEMENT     left knee  7/12  . TOTAL KNEE ARTHROPLASTY  05/22/2011   Procedure: TOTAL KNEE ARTHROPLASTY;  Surgeon: Mauri Pole, MD;  Location: [facility];  Service: Orthopedics;  Laterality: Right;    Social History  Substance Use Topics  . Smoking status: Former Smoker    Packs/day: 1.00    Quit date: 05/10/1989  . Smokeless tobacco: Never Used  . Alcohol use 0.0 oz/week     Comment: one drink once a month    Family History  Problem Relation Age of Onset  . Prostate cancer Father   . Stroke Father     Allergies  Allergen Reactions  . Crestor [Rosuvastatin Calcium] Other (See Comments)    Feeling very bad, aching all over.  . Erythromycin Hives  . Gabapentin Other (See Comments)    Blurred vision  . Pregabalin Other  (See Comments)    Crossed vision.  . Carbidopa Other (See Comments)    Headaches, nausea, dizziness  . Macrobid [Nitrofurantoin] Hives  . Losartan Other (See Comments)    Medication list has been reviewed and updated.  Current Outpatient Prescriptions on File Prior to Visit  Medication  Sig Dispense Refill  . acetaminophen (TYLENOL) 500 MG tablet Take 500 mg by mouth every 6 (six) hours as needed for mild pain.    Marland Kitchen aspirin 81 MG tablet Take 81 mg by mouth daily after breakfast.     . atorvastatin (LIPITOR) 10 MG tablet Take 1 tablet (10 mg total) by mouth daily. 30 tablet 1  . azelastine (ASTELIN) 0.1 % nasal spray Place 1 spray into both nostrils 2 (two) times daily. Use in each nostril as directed    . Carbidopa-Levodopa ER (RYTARY) 23.75-95 MG CPCR Take 1 tablet by mouth 3 (three) times daily. 100 capsule 0  . celecoxib (CELEBREX) 100 MG capsule Take 1 capsule (100 mg total) by mouth 2 (two) times daily. 60 capsule 1  . Cholecalciferol (VITAMIN D3) 5000 UNITS CAPS Take 5,000 Units by mouth daily.     . cyanocobalamin 500 MCG tablet Take 500 mcg by mouth daily.    . DULoxetine (CYMBALTA) 60 MG capsule Take 1 capsule (60 mg total) by mouth daily. 30 capsule 3  . magnesium oxide (MAG-OX) 400 (241.3 Mg) MG tablet Take 0.5 tablets (200 mg total) by mouth daily. 30 tablet 0  . Menthol, Topical Analgesic, (BIOFREEZE EX) Apply 1 application topically 4 (four) times daily. Apply to affected area.    . methocarbamol (ROBAXIN) 500 MG tablet Take 500 mg by mouth every 6 (six) hours as needed for muscle spasms.    Marland Kitchen omega-3 acid ethyl esters (LOVAZA) 1 g capsule Take 1 g by mouth daily.     . Oxycodone HCl 10 MG TABS Take by mouth every 8 (eight) hours as needed.     . potassium chloride SA (K-DUR,KLOR-CON) 20 MEQ tablet Take 1 tablet (20 mEq total) by mouth daily. 30 tablet 0  . SYNTHROID 150 MCG tablet Take 1 tablet (150 mcg total) by mouth daily before breakfast. 30 tablet 1  .  telmisartan-hydrochlorothiazide (MICARDIS HCT) 40-12.5 MG per tablet Take 1 tablet by mouth daily.   1  . zolpidem (AMBIEN) 5 MG tablet Take 5 mg by mouth at bedtime.     No current facility-administered medications on file prior to visit.     Review of Systems:  As per HPI- otherwise negative.   Physical Examination: Blood pressure 110/65, pulse 90, temperature 97.9 F (36.6 C), temperature source Oral, height 5' 6.5" (1.689 m), weight 196 lb 12.8 oz (89.3 kg), SpO2 98 %.  Vitals:   03/02/16 1503  Temp: 97.9 F (36.6 C)   There were no vitals filed for this visit. There is no height or weight on file to calculate BMI. Ideal Body Weight:    GEN: WDWN, NAD, Non-toxic, A & O x 3, overweight, looks well HEENT: Atraumatic, Normocephalic. Neck supple. No masses, No LAD. Ears and Nose: No external deformity. CV: RRR, No M/G/R. No JVD. No thrill. No extra heart sounds. PULM: CTA B, no wheezes, crackles, rhonchi. No retractions. No resp. distress. No accessory muscle use. ABD: S, NT, ND, +BS. No rebound. No HSM. EXTR: No c/c/e NEURO slow, uses a her walker PSYCH: Normally interactive. Conversant. Not depressed or anxious appearing.  Calm demeanor.  Using her walker- looks well today   Assessment and Plan: Hypercoagulable state ([place])  Screening for colon cancer - Plan: Ambulatory referral to Gastroenterology  Chronic insomnia  Chronic anticoagulation  Primary hypercoagulable state [facility])  Here today for a follow-up visit.  I was able to reach [place] and we discussed her coumadin, plan to repeat INR  in 10 days Referral back to GI for a colonoscopy Encouraged a mammogram Discussed her insomnia- I would prefer to have her off [name] given her age and other health issues.  She plans to try melatonin instead which is a great idea  Further follow-up pending her next INR check  She brings in some disability paperwork- it seems that she is eligible for LTD.  However this paperwork  is just for her to fill out- there is no section for me to do.  She plans to complete at home   Signed Lamar Blinks, MD

## 2016-03-02 NOTE — Progress Notes (Signed)
Pre visit review using our clinic review tool, if applicable. No additional management support is needed unless otherwise documented below in the visit note. 

## 2016-03-02 NOTE — Telephone Encounter (Signed)
Caller: Caryl Pina, nurse with [facility]  [phone]  Reason for Call: Critical Lab Value  She reported the following results and requesting further recommendation for dosage of Coumadin: PT 23 & INR 1.9.

## 2016-03-02 NOTE — Telephone Encounter (Signed)
-----   Message from Emi Holes, [place] sent at 03/02/2016  4:52 PM EST ----- Received this message from [place] in the front office:  Call Ashlee @ [phone] to let her know what dose of Coumadin Dr Lorelei Pont has patient on.  Please advise. Thanks

## 2016-03-02 NOTE — Patient Instructions (Addendum)
You can certainly try melatonin for sleep- try 1 to 5 mg at bedtime as needed.  We are going to adjust your coumadin slightly- your INR is very close to your goal.  We are going to have you take 6 mg on thursdays (start today) and then take 4 mg every other day of the week.  We can plan to repeat your INR in 1-2 weeks .    If you like, you can drop by the imaging department on the ground floor and see if they can get your mammogram done today

## 2016-03-02 NOTE — Telephone Encounter (Signed)
Called [name] [name] back and went over [name]'s coumadin plan.  She will recheck her INR in 10 days on 03/13/16

## 2016-03-03 DIAGNOSIS — I739 Peripheral vascular disease, unspecified: Secondary | ICD-10-CM | POA: Diagnosis not present

## 2016-03-03 DIAGNOSIS — M797 Fibromyalgia: Secondary | ICD-10-CM | POA: Diagnosis not present

## 2016-03-03 DIAGNOSIS — M1990 Unspecified osteoarthritis, unspecified site: Secondary | ICD-10-CM | POA: Diagnosis not present

## 2016-03-03 DIAGNOSIS — G20 Parkinson's disease: Secondary | ICD-10-CM | POA: Diagnosis not present

## 2016-03-03 DIAGNOSIS — I10 Essential (primary) hypertension: Secondary | ICD-10-CM | POA: Diagnosis not present

## 2016-03-03 DIAGNOSIS — S12490D Other displaced fracture of fifth cervical vertebra, subsequent encounter for fracture with routine healing: Secondary | ICD-10-CM | POA: Diagnosis not present

## 2016-03-07 ENCOUNTER — Telehealth: Payer: Self-pay | Admitting: Family Medicine

## 2016-03-07 NOTE — Telephone Encounter (Signed)
Caller name: Benjamine Mola Relation to [id number] club Call back number: [phone] Pharmacy: [facility]   Reason for call: pt is complaining of upset stomach, some diarrhea no headaches, Nanine Means would like to know if something can be call in

## 2016-03-07 NOTE — Telephone Encounter (Signed)
Caller name: Relationship to patient:Self Can be reached: [phone]  Pharmacy:   [facility], [place] - [place] [phone] (Phone) [phone] (Fax)    Reason for call: Refill on zolpidem (AMBIEN) 5 MG tablet  SYNTHROID 150 MCG tablet  Oxycodone HCl 10 MG TABS

## 2016-03-08 ENCOUNTER — Other Ambulatory Visit: Payer: Self-pay | Admitting: Emergency Medicine

## 2016-03-08 MED ORDER — SYNTHROID 150 MCG PO TABS: 150.0000 ug | ORAL_TABLET | Freq: Every day | ORAL | 3 refills | Status: DC

## 2016-03-08 MED ORDER — BENZONATATE 100 MG PO CAPS: 100.0000 mg | ORAL_CAPSULE | Freq: Three times a day (TID) | ORAL | 0 refills | Status: DC | PRN

## 2016-03-08 MED ORDER — ZOLPIDEM TARTRATE 5 MG PO TABS: 5.0000 mg | ORAL_TABLET | Freq: Every day | ORAL | 1 refills | Status: DC

## 2016-03-08 MED ORDER — OXYCODONE HCL 10 MG PO TABS: ORAL_TABLET | ORAL | 0 refills | Status: DC

## 2016-03-08 NOTE — Telephone Encounter (Signed)
Reviewed chart and [place] Received oxycodone 10 #20 on 10/21 Received ambien 10 #30 with 2 RF on 10/10  Per last note she was taking oxycodone once a day for neck pain We had discussed stopping her ambien at last visit- I am not sure if she stopped this or not.  Called her and LMOM- if she does not need [name] do not need to take it, it is a prn medication.

## 2016-03-08 NOTE — Telephone Encounter (Signed)
Called her back- I just got this message today.  She no longer has diarrhea.   She notes a cough- I will rx tessalon for her.  Will fax rx for her synthroid, tessalon, ambien and oxycodone to [facility] for her

## 2016-03-10 ENCOUNTER — Telehealth: Payer: Self-pay | Admitting: Family Medicine

## 2016-03-10 NOTE — Telephone Encounter (Signed)
Called to authorize verbal order to continue OT 2 times a week for 2 weeks due to pt missing OT this week because of illness.

## 2016-03-10 NOTE — Telephone Encounter (Signed)
Caller name: Relationship to patient: [facility] Can be reached:  [phone] Pharmacy:  Reason for call: States patient has been sick this week and has not had OT. Request verbal order to continue OT 2 times a week for 2 weeks

## 2016-03-13 ENCOUNTER — Ambulatory Visit (INDEPENDENT_AMBULATORY_CARE_PROVIDER_SITE_OTHER): Payer: Medicare Other | Admitting: Family Medicine

## 2016-03-13 ENCOUNTER — Encounter: Payer: Self-pay | Admitting: Family Medicine

## 2016-03-13 ENCOUNTER — Telehealth: Payer: Self-pay | Admitting: Physical Medicine & Rehabilitation

## 2016-03-13 ENCOUNTER — Telehealth: Payer: Self-pay

## 2016-03-13 VITALS — BP 132/70 | HR 94 | Temp 97.1°F | Wt 194.6 lb

## 2016-03-13 DIAGNOSIS — M797 Fibromyalgia: Secondary | ICD-10-CM | POA: Diagnosis not present

## 2016-03-13 DIAGNOSIS — M1990 Unspecified osteoarthritis, unspecified site: Secondary | ICD-10-CM | POA: Diagnosis not present

## 2016-03-13 DIAGNOSIS — I10 Essential (primary) hypertension: Secondary | ICD-10-CM | POA: Diagnosis not present

## 2016-03-13 DIAGNOSIS — J0100 Acute maxillary sinusitis, unspecified: Secondary | ICD-10-CM

## 2016-03-13 DIAGNOSIS — S12490D Other displaced fracture of fifth cervical vertebra, subsequent encounter for fracture with routine healing: Secondary | ICD-10-CM | POA: Diagnosis not present

## 2016-03-13 DIAGNOSIS — I739 Peripheral vascular disease, unspecified: Secondary | ICD-10-CM | POA: Diagnosis not present

## 2016-03-13 DIAGNOSIS — G20 Parkinson's disease: Secondary | ICD-10-CM | POA: Diagnosis not present

## 2016-03-13 MED ORDER — AMOXICILLIN-POT CLAVULANATE 875-125 MG PO TABS: 1.0000 | ORAL_TABLET | Freq: Two times a day (BID) | ORAL | 0 refills | Status: DC

## 2016-03-13 NOTE — Telephone Encounter (Signed)
Paperwork to complete for return to work to [employer] for completion

## 2016-03-13 NOTE — Telephone Encounter (Signed)
Received phone call from [facility] regarding patients INR today. INR = 4.2 Patient states she has been eating more Turnip Greens than before. Last INR on 03/02/16 = 1.9. Patient taking Coumadin 6mg  Thursday and 4 mg all other days.   Per Dr. Lorelei Pont patient to hold one dose and then start 4 mg daily. Repeat INR in 7 days. Informed [facility] patients nurse.

## 2016-03-13 NOTE — Patient Instructions (Addendum)
Wait 3 days before taking antibiotics if symptoms fail to improve. If you start to worsen, take the antibiotic right away.  Claritin (loratadine), Allegra (fexofenadine), Zyrtec (cetirizine); these are listed in order from weakest to strongest. Generic, and therefore cheaper, options are in the parentheses.   Flonase (fluticasone); nasal spray that is over the counter. 2 sprays each nostril, once daily. Aim towards the same side eye when you spray.  There are available OTC, and the generic versions, which may be cheaper, are in parentheses. Show this to a pharmacist if you have trouble finding any of these items.

## 2016-03-13 NOTE — Progress Notes (Signed)
Pre visit review using our clinic review tool, if applicable. No additional management support is needed unless otherwise documented below in the visit note. 

## 2016-03-13 NOTE — Progress Notes (Signed)
Chief Complaint  Patient presents with  . Possible URI    [name] here for URI complaints.  Duration: 1 week  Associated symptoms: sinus congestion, sinus pain, rhinorrhea, ear pain, sore throat and cough Denies: subjective fever, itchy watery eyes, ear drainage, shortness of breath and myalgia Treatment to date: Tessalon Perles Sick contacts: No  ROS:  Const: Denies fevers HEENT: As noted in HPI Lungs: No SOB  Past Medical History:  Diagnosis Date  . Arthritis   . Chronic insomnia   . Complication of anesthesia    severe itching night of surgery requiring meds- also couldnt swallow- throat "paralyzed""  . Fibromyalgia   . Glaucoma   . Hypercholesterolemia   . Hyperlipidemia   . Hypertension    PCP Dr Cari Caraway- [place]  05/15/11 with clearance and note on chart,    chest x ray, EKG 12/12 EPIC, eccho 7/11 EPIC  . Hypothyroidism    s/p graves disease  . Kidney stone   . PD (Parkinson's disease) ([place])   . Peripheral vascular disease ([place])    PULMONARY EMBOLUS x 2- 2011, 2012/ FOLLOWED BY DR ODOGWU-LOV 12/12 EPIC   PT STAES WILL STOP COUMADIN 3/12 and begin LOVONOX 05/17/11 as previously instructed  . Sinus infection    at present- dr Honor Loh PA aware- was seen there and at PCP 05/10/11  . Thyroid disease    Hypothyroidism   Family History  Problem Relation Age of Onset  . Prostate cancer Father   . Stroke Father     BP 132/70 (BP Location: Left Arm, Patient Position: Sitting, Cuff Size: Normal)   Pulse 94   Temp 97.1 F (36.2 C) (Oral)   Wt 194 lb 9.6 oz (88.3 kg)   BMI 30.94 kg/m  General: Awake, alert, appears stated age HEENT: AT, [place], ears patent b/l and TM's neg, nares patent w/o discharge, max sinuses TTP b/l, no frontal sinus tenderness, pharynx pink and without exudates, MMM Neck: No masses or asymmetry Heart: RRR, no murmurs, no bruits Lungs: CTAB, no accessory muscle use Psych: Age appropriate judgment and insight, normal mood and affect  Acute  non-recurrent maxillary sinusitis - Plan: amoxicillin-clavulanate (AUGMENTIN) 875-125 MG tablet  Orders as above. Day 11, wait 3 days with supportive care before taking abx. If worsening, OK to take. INCS, move up to stronger antihistamine recommended. Continue to push fluids, practice good hand hygiene, cover mouth when coughing. F/u in 1 week if symptoms worsen or fail to improve. Pt voiced understanding and agreement to the plan.  [place], DO 03/13/16 3:34 PM

## 2016-03-14 ENCOUNTER — Telehealth: Payer: Self-pay | Admitting: Family Medicine

## 2016-03-14 NOTE — Telephone Encounter (Signed)
Nursing home facility called in because pt received a Rx for Oxycodone HCl 10 MG TABS. She says that dosage has to specify either 1/2 or 1 it cant reflect/ read the way that its written out.   She says that a new Rx would need to be faxed over to there facility so that they are able to administer to pt.      Fax: [phone] Rumble   Phone : [phone]

## 2016-03-15 ENCOUNTER — Telehealth: Payer: Self-pay | Admitting: Family Medicine

## 2016-03-15 DIAGNOSIS — I10 Essential (primary) hypertension: Secondary | ICD-10-CM | POA: Diagnosis not present

## 2016-03-15 DIAGNOSIS — M1990 Unspecified osteoarthritis, unspecified site: Secondary | ICD-10-CM | POA: Diagnosis not present

## 2016-03-15 DIAGNOSIS — M797 Fibromyalgia: Secondary | ICD-10-CM | POA: Diagnosis not present

## 2016-03-15 DIAGNOSIS — G20 Parkinson's disease: Secondary | ICD-10-CM | POA: Diagnosis not present

## 2016-03-15 DIAGNOSIS — I739 Peripheral vascular disease, unspecified: Secondary | ICD-10-CM | POA: Diagnosis not present

## 2016-03-15 DIAGNOSIS — S12490D Other displaced fracture of fifth cervical vertebra, subsequent encounter for fracture with routine healing: Secondary | ICD-10-CM | POA: Diagnosis not present

## 2016-03-15 MED ORDER — OXYCODONE HCL 10 MG PO TABS: ORAL_TABLET | ORAL | 0 refills | Status: DC

## 2016-03-15 NOTE — Telephone Encounter (Signed)
[facility] OT calling to discharge patient from Home health services. Needing a call: Francee Nodal [phone] is the OT

## 2016-03-15 NOTE — Telephone Encounter (Signed)
Meds ordered this encounter  Medications  . Oxycodone HCl 10 MG TABS    Sig: Take 1/2 tablet daily as needed for neck pain. If pain persists may take another 1/2 tablet per day    Dispense:  30 tablet    Refill:  0   Faxed to SNF

## 2016-03-15 NOTE — Telephone Encounter (Signed)
Called Richardson Landry and gave ok

## 2016-03-20 ENCOUNTER — Telehealth: Payer: Self-pay | Admitting: Family Medicine

## 2016-03-20 DIAGNOSIS — G20 Parkinson's disease: Secondary | ICD-10-CM | POA: Diagnosis not present

## 2016-03-20 DIAGNOSIS — M1990 Unspecified osteoarthritis, unspecified site: Secondary | ICD-10-CM | POA: Diagnosis not present

## 2016-03-20 DIAGNOSIS — I10 Essential (primary) hypertension: Secondary | ICD-10-CM | POA: Diagnosis not present

## 2016-03-20 DIAGNOSIS — S12490D Other displaced fracture of fifth cervical vertebra, subsequent encounter for fracture with routine healing: Secondary | ICD-10-CM | POA: Diagnosis not present

## 2016-03-20 DIAGNOSIS — M797 Fibromyalgia: Secondary | ICD-10-CM | POA: Diagnosis not present

## 2016-03-20 DIAGNOSIS — I739 Peripheral vascular disease, unspecified: Secondary | ICD-10-CM | POA: Diagnosis not present

## 2016-03-20 NOTE — Telephone Encounter (Signed)
[place] [facility] nurse [facility] [phone]   PT 29.1 INR 2.4  4 MG daily

## 2016-03-21 NOTE — Telephone Encounter (Signed)
Called and LMOM for [name] [name]- INR at goal. Continue current coumadin dose and repeat in 2 weeks

## 2016-03-24 DIAGNOSIS — M1990 Unspecified osteoarthritis, unspecified site: Secondary | ICD-10-CM | POA: Diagnosis not present

## 2016-03-24 DIAGNOSIS — I739 Peripheral vascular disease, unspecified: Secondary | ICD-10-CM | POA: Diagnosis not present

## 2016-03-24 DIAGNOSIS — M797 Fibromyalgia: Secondary | ICD-10-CM | POA: Diagnosis not present

## 2016-03-24 DIAGNOSIS — G20 Parkinson's disease: Secondary | ICD-10-CM | POA: Diagnosis not present

## 2016-03-24 DIAGNOSIS — S12490D Other displaced fracture of fifth cervical vertebra, subsequent encounter for fracture with routine healing: Secondary | ICD-10-CM | POA: Diagnosis not present

## 2016-03-24 DIAGNOSIS — I10 Essential (primary) hypertension: Secondary | ICD-10-CM | POA: Diagnosis not present

## 2016-03-28 ENCOUNTER — Encounter: Payer: Self-pay | Admitting: Psychology

## 2016-03-28 ENCOUNTER — Telehealth: Payer: Self-pay | Admitting: Neurology

## 2016-03-28 ENCOUNTER — Ambulatory Visit (INDEPENDENT_AMBULATORY_CARE_PROVIDER_SITE_OTHER): Payer: Medicare Other | Admitting: Psychology

## 2016-03-28 DIAGNOSIS — G232 Striatonigral degeneration: Secondary | ICD-10-CM

## 2016-03-28 DIAGNOSIS — F4323 Adjustment disorder with mixed anxiety and depressed mood: Secondary | ICD-10-CM

## 2016-03-28 DIAGNOSIS — R413 Other amnesia: Secondary | ICD-10-CM | POA: Diagnosis not present

## 2016-03-28 DIAGNOSIS — S0990XS Unspecified injury of head, sequela: Secondary | ICD-10-CM

## 2016-03-28 MED ORDER — CARBIDOPA-LEVODOPA ER 23.75-95 MG PO CPCR: 1.0000 | ORAL_CAPSULE | Freq: Three times a day (TID) | ORAL | 0 refills | Status: DC

## 2016-03-28 NOTE — Telephone Encounter (Signed)
Received message from Dr. Si Raider.  Happy to write script for the melatonin.  Will need to ask PCP about zyrtec since that is not for her neurologic condition.

## 2016-03-28 NOTE — Telephone Encounter (Signed)
-----   Message from Kandis Nab, [place] sent at 03/28/2016 10:54 AM EST ----- I saw [name] and her daughter today for initial interview. Ms. Schmude reported that [name] [name] requires a prescription for any and all medications (even over the counter). She wants to know if you can write a script for melatonin and Zyrtec and fax to [facility]? She was taking both, but they were taken away from her because she is not allowed to have any medications in her room. In order to take them again, she needs Rx.

## 2016-03-28 NOTE — Telephone Encounter (Signed)
Spoke with patient and she states the nurse at [facility] is Freight forwarder for Zyrtec and Melatonin from PCP. She will call me back if she needs [name] to send the Melatonin RX. She takes 5 mg at night.

## 2016-03-28 NOTE — Telephone Encounter (Signed)
Tried to call patient with no answer. Will try again later 

## 2016-03-28 NOTE — Progress Notes (Signed)
NEUROPSYCHOLOGICAL INTERVIEW (CPT: [id number])  Name: [name] [name] Date of Birth: 1945/11/29 Date of Interview: 03/28/2016  Reason for Referral:  [name] [name] is a 71 y.o. female with history of parkinsonism who is referred for neuropsychological evaluation by Dr. Wells Guiles Tat of [facility] due to concerns about cognitive changes s/p fall in October 2017. This patient is accompanied in the office by her daughter who supplements the history.  History of Presenting Problem:  [name] [name] is followed by Dr. [name] for parkinsonism (possible MSA). She last saw Dr. [name] on 01/25/2016. MOCA at that time was 23/30. The patient had fallen on 12/17/2015 when she got up in the middle of the night to use the bathroom and lost her balance and fell through the sheet rock head-first. She denied LOC. She sustained a moderately displaced fracture involving the posterior spinous process of C5. She was hospitalized from 10/13-10/16 and in inpatient rehabilitation from 10/16-10/27. She is currently residing at [facility].   Prior to the fall, the patient was managing all instrumental ADLs and was driving even though she was told not to due to vision impairment. The patient also was working from home full-time. 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   The patient reported cognitive symptoms immediately following the probable concussion in October, but she feels these symptoms have resolved. She thinks she is back to her baseline cognitively. Her daughter, [name] [name], agrees that there has been significant improvement but does not think the patient is quite back to her baseline.   Immediately after the fall, the patient experienced confusion, disorientation, hallucinations and delusions, per her daughter. Even once she was out of the hospital, "her behavior was off". She had trouble using her cell phone and paying her bills. [name] [name] feels the  patient's processing speed is slowed.  [name] [name] does note that there were some mild cognitive issues prior to the fall. Old medication bottles with lots of medications in them were found when the family went in her home while she was in the hospital. It appears that she was not taking her medications as prescribed, and did not have a good system in place.   Since the fall, [name] [name] has not driven. At [facility], the patient's medications are managed for her and administered directly to her. She does not have access to cooking/kitchen in her apartment and has to go to the dining room for all meals. [name] [name] was helping her with her finances, but [name] [name] recently started managing these on her own, and there have not been any problems that her daughter knows of.    [name] [name] and [name] [name] denied the following symptoms at the present time: Forgetting recent conversations/events, misplacing/losing items, forgetting appointments/obligations, difficulty concentrating, starting but not finishing tasks, distractibility, communication difficulties, or comprehension difficulty.   The patient is unhappy with having to move to assisted living, but she understands this was the recommendation of her physicians. Her son and his girlfriend and children are currently living in her home. There is a lot of family stress surrounding her son, and her son and daughter are no longer speaking. [name] [name] has tried to help the patient as much as she can, but she has three children (including 55yo with autism and an 20mo old baby) and works as well.   [name] [name] reported increased depression secondary to her condition and reduced independence. She would like to return to work. She wishes she could return to  living in her home but she feels she does not have a choice in the matter. Her daughter has informed her that the other option is for her to move into an apartment and have in-home care.   The patient also reported a new fear of  death since her fall. She is also worried she will have another fall and be more badly injured.  Her appetite is reduced, but she doesn't like the food at [place]. She is having sleep difficulty. She was previously taking 10 mg of Ambien nightly for 15 years. Her daughter was very concerned about balance issues and confusion secondary to Ambien. She observed her mother's mobility/balance being very impaired after the patient took Ambien. Also, the patient's falls tended to occur at nighttime. As such, Ambien was cut to 5 mg nightly. Now the patient feels her sleep is very impaired. She has difficulty falling asleep and staying asleep. She has started taking melatonin. I provided her with information regarding sleep hygiene as well.   Physically, the patient complains of chronic pain (she has fibromyalgia). She reported a "splitting headache" the day of her interview with me. She has not had any falls since the one in October. She is using a walker. She was recently discharged from physical therapy.   She has not had any hallucinations since being discharged from the hospital. She denied suicidal ideation or intention.   Psychiatric history was denied. She did have normal grief after the unexpected death of her husband and she had grief counseling for that. She noted that since then she has been more prone to get tearful. Recently, she is much more tearful.  Family history is reportedly significant for hand tremors in her late sister and late father, and cerebral palsy in her other sister (still living, age 65). There is no known family history of dementia.   Social History: Born/Raised: [employer]. Took some college courses. Occupational history: Currently on disability. She has worked in the Company secretary since 1991. She took early retirement from the company she was working for in [employer]. A company who used to use their services asked her to help them with  the same kind fo work. She has been doing this for 15 years. She works from home. Marital history: Widowed with two children and five grandchildren. Alcohol/Tobacco/Substances: Essentially no alcohol (very rare/minimal). Former smoker (quit 1991). No history of recreational drug use.   Medical History: Past Medical History:  Diagnosis Date  . Arthritis   . Chronic insomnia   . Complication of anesthesia    severe itching night of surgery requiring meds- also couldnt swallow- throat "paralyzed""  . Fibromyalgia   . Glaucoma   . Hypercholesterolemia   . Hyperlipidemia   . Hypertension    PCP Dr Cari Caraway- [place]  05/15/11 with clearance and note on chart,    chest x ray, EKG 12/12 EPIC, eccho 7/11 EPIC  . Hypothyroidism    s/p graves disease  . Kidney stone   . PD (Parkinson's disease) ([place])   . Peripheral vascular disease ([place])    PULMONARY EMBOLUS x 2- 2011, 2012/ FOLLOWED BY DR ODOGWU-LOV 12/12 EPIC   PT STAES WILL STOP COUMADIN 3/12 and begin LOVONOX 05/17/11 as previously instructed  . Sinus infection    at present- dr Honor Loh PA aware- was seen there and at PCP 05/10/11  . Thyroid disease    Hypothyroidism    Current Medications:  Outpatient Encounter Prescriptions as of 03/28/2016  Medication Sig  . acetaminophen (TYLENOL) 500 MG tablet Take 500 mg by mouth every 6 (six) hours as needed for mild pain.  Marland Kitchen amoxicillin-clavulanate (AUGMENTIN) 875-125 MG tablet Take 1 tablet by mouth 2 (two) times daily.  Marland Kitchen aspirin 81 MG tablet Take 81 mg by mouth daily after breakfast.   . atorvastatin (LIPITOR) 10 MG tablet Take 1 tablet (10 mg total) by mouth daily.  Marland Kitchen azelastine (ASTELIN) 0.1 % nasal spray Place 1 spray into both nostrils 2 (two) times daily. Use in each nostril as directed  . benzonatate (TESSALON) 100 MG capsule Take 1 capsule (100 mg total) by mouth 3 (three) times daily as needed for cough.  . Carbidopa-Levodopa ER (RYTARY) 23.75-95 MG CPCR Take 1 tablet by mouth 3 (three)  times daily.  . celecoxib (CELEBREX) 100 MG capsule Take 1 capsule (100 mg total) by mouth 2 (two) times daily.  . Cholecalciferol (VITAMIN D3) 5000 UNITS CAPS Take 5,000 Units by mouth daily.   . cyanocobalamin 500 MCG tablet Take 500 mcg by mouth daily.  . DULoxetine (CYMBALTA) 60 MG capsule Take 1 capsule (60 mg total) by mouth daily.  . magnesium oxide (MAG-OX) 400 (241.3 Mg) MG tablet Take 0.5 tablets (200 mg total) by mouth daily.  . Menthol, Topical Analgesic, (BIOFREEZE EX) Apply 1 application topically 4 (four) times daily. Apply to affected area.  . methocarbamol (ROBAXIN) 500 MG tablet Take 500 mg by mouth every 6 (six) hours as needed for muscle spasms.  Marland Kitchen omega-3 acid ethyl esters (LOVAZA) 1 g capsule Take 1 g by mouth daily.   . Oxycodone HCl 10 MG TABS Take 1/2 tablet daily as needed for neck pain. If pain persists may take another 1/2 tablet per day  . potassium chloride SA (K-DUR,KLOR-CON) 20 MEQ tablet Take 1 tablet (20 mEq total) by mouth daily.  Marland Kitchen SYNTHROID 150 MCG tablet Take 1 tablet (150 mcg total) by mouth daily before breakfast.  . telmisartan-hydrochlorothiazide (MICARDIS HCT) 40-12.5 MG per tablet Take 1 tablet by mouth daily.   Marland Kitchen warfarin (COUMADIN) 4 MG tablet Take 4 mg by mouth daily.  Marland Kitchen warfarin (COUMADIN) 6 MG tablet   . zolpidem (AMBIEN) 5 MG tablet Take 1 tablet (5 mg total) by mouth at bedtime. Use ONLY WHEN NEEDED for sleep. Does not have to be used daily   No facility-administered encounter medications on file as of 03/28/2016.     Behavioral Observations:   Appearance: Neatly and appropriately dressed and groomed, appearing younger than her chronological age Gait: Ambulated with a walker, cautious and slow  Speech: Fluent; normal rate, rhythm and volume. No significant word finding difficulty. Thought process: Linear, goal directed Affect: Blunted. Does get tearful on occasion (PBA?) Interpersonal: Pleasant, appropriate   TESTING: There is medical  necessity to proceed with neuropsychological assessment as the results will be used to aid in differential diagnosis and clinical decision-making and to inform specific treatment recommendations. Per the patient, her daughter and medical records reviewed, there has been a change in cognitive functioning and a reasonable suspicion of mild cognitive impairment or dementia.   PLAN: The patient will return for a full battery of neuropsychological testing with a psychometrician under my supervision. Education regarding testing procedures was provided. Subsequently, the patient will see this provider for a follow-up session at which time her test performances and my impressions and treatment recommendations will be reviewed in detail.   Full neuropsychological evaluation report to follow.

## 2016-03-28 NOTE — Telephone Encounter (Signed)
[name] [name] 05/21/1945. She came in today to see Dr. Si Raider and while here she asked could she have more Rytary? She will be going into an assisted Living Facility and she said they told her that it would be fine? Her # is [phone]. Thank you

## 2016-03-28 NOTE — Telephone Encounter (Signed)
Samples given to patient  

## 2016-04-03 ENCOUNTER — Encounter: Payer: Self-pay | Admitting: Family Medicine

## 2016-04-03 NOTE — Telephone Encounter (Signed)
RN called from [name] has had some bright red blood in her urine. She has not noted any other bleeding and does not have any pain. She has had a kidney stone in the past; they are not sure if this could be occurring again.  Will need to have her seen- will get her scheduled for tomorrow- can call pt at [phone] with appt info. We were able to get her an appt at the [facility] office tomorrow at 4pm, Dr Pricilla Holm.  Called [name] and let her know the details of her appt  Most recent INR was 2.4 on 03/21/16.  History of PE

## 2016-04-03 NOTE — Telephone Encounter (Signed)
error:[id number] ° °

## 2016-04-04 ENCOUNTER — Encounter: Payer: Self-pay | Admitting: Internal Medicine

## 2016-04-04 ENCOUNTER — Ambulatory Visit (INDEPENDENT_AMBULATORY_CARE_PROVIDER_SITE_OTHER): Payer: Medicare Other | Admitting: Internal Medicine

## 2016-04-04 VITALS — BP 144/82 | HR 93 | Temp 97.9°F | Resp 16 | Ht 66.5 in | Wt 199.0 lb

## 2016-04-04 DIAGNOSIS — R05 Cough: Secondary | ICD-10-CM | POA: Diagnosis not present

## 2016-04-04 DIAGNOSIS — R319 Hematuria, unspecified: Secondary | ICD-10-CM

## 2016-04-04 DIAGNOSIS — R059 Cough, unspecified: Secondary | ICD-10-CM

## 2016-04-04 LAB — POC URINALSYSI DIPSTICK (AUTOMATED)
Bilirubin, UA: NEGATIVE
Glucose, UA: NEGATIVE
Ketones, UA: NEGATIVE
LEUKOCYTES UA: NEGATIVE
Nitrite, UA: NEGATIVE
PH UA: 5
Spec Grav, UA: >=1.03
UROBILINOGEN UA: 0.2

## 2016-04-04 MED ORDER — HYDROCODONE-HOMATROPINE 5-1.5 MG/5ML PO SYRP: 5.0000 mL | ORAL_SOLUTION | Freq: Three times a day (TID) | ORAL | 0 refills | Status: DC | PRN

## 2016-04-04 MED FILL — HYDROCODONE-HOMATROPINE SYR: 5-1.5 | 8 days supply | Qty: 120 | Fill #0

## 2016-04-04 NOTE — Progress Notes (Signed)
   Subjective:    Patient ID: [name] [name], female    DOB: 03/21/1945, 71 y.o.   MRN: [id number]  HPI The patient is a 71 YO female coming in for blood in urine. She has seen it off and on the last day or two. She does take coumadin for PE and last INR check in range. Hx kidney stone but no pain or symptoms of that now. She has been coughing a lot lately and wonders if this is related. She has been coughing for about the last 5 days or so. Denies fevers or chills. She is not having any SOB or wheezing. She has been taking some over the counter cold medicine and she is not sure if it is helping at all. She lives at [facility] and many people there have been sick recently. No muscle aches or headaches.   Review of Systems  Constitutional: Positive for activity change and fatigue. Negative for appetite change, chills, fever and unexpected weight change.  HENT: Positive for congestion, postnasal drip and rhinorrhea. Negative for ear discharge, ear pain, sinus pain, sinus pressure, sore throat and trouble swallowing.   Eyes: Negative.   Respiratory: Positive for cough. Negative for chest tightness, shortness of breath and wheezing.   Cardiovascular: Negative.   Gastrointestinal: Negative.   Genitourinary: Positive for hematuria. Negative for decreased urine volume, difficulty urinating, dysuria, flank pain, frequency, genital sores, menstrual problem, pelvic pain and urgency.  Musculoskeletal: Negative.       Objective:   Physical Exam  Constitutional: She is oriented to person, place, and time. She appears well-developed and well-nourished.  HENT:  Head: Normocephalic and atraumatic.  Right Ear: External ear normal.  Left Ear: External ear normal.  Oropharynx with redness and clear drainage, no nasal crusting, no sinus pressure.   Eyes: EOM are normal.  Neck: Normal range of motion. No JVD present.  Cardiovascular: Normal rate and regular rhythm.   Pulmonary/Chest: Effort normal and breath  sounds normal. No respiratory distress. She has no wheezes. She has no rales.  Abdominal: Soft.  Lymphadenopathy:    She has no cervical adenopathy.  Neurological: She is alert and oriented to person, place, and time.  Skin: Skin is warm and dry.   Vitals:   04/04/16 1601  BP: (!) 144/82  Pulse: 93  Resp: 16  Temp: 97.9 F (36.6 C)  TempSrc: Oral  SpO2: 97%  Weight: 199 lb (90.3 kg)  Height: 5' 6.5" (1.689 m)      Assessment & Plan:

## 2016-04-04 NOTE — Patient Instructions (Signed)
We have given you the cough syrup today. You can use this for cough.  It is okay to take cold medicine over the counter.   Call us back on Friday if you are not feeling better.

## 2016-04-04 NOTE — Progress Notes (Signed)
Pre visit review using our clinic review tool, if applicable. No additional management support is needed unless otherwise documented below in the visit note. 

## 2016-04-05 DIAGNOSIS — R319 Hematuria, unspecified: Secondary | ICD-10-CM

## 2016-04-05 DIAGNOSIS — R059 Cough, unspecified: Secondary | ICD-10-CM | POA: Insufficient documentation

## 2016-04-05 DIAGNOSIS — R05 Cough: Secondary | ICD-10-CM | POA: Insufficient documentation

## 2016-04-05 HISTORY — DX: Hematuria, unspecified: R31.9

## 2016-04-05 NOTE — Assessment & Plan Note (Signed)
No gross blood any longer but she does still have microscopic hematuria today. It is possible this could be related to her cough. She is not having symptoms of kidney stone at this time. Recommend recheck at next routine visit with her PCP and if persistent should be evaluated with urology.

## 2016-04-05 NOTE — Assessment & Plan Note (Signed)
Rx for hycodan cough syrup. No indication for antibiotics or steroids. Does not seem like flu but outside window for tamiflu regardless. Continue supportive measures.

## 2016-04-07 ENCOUNTER — Telehealth: Payer: Self-pay | Admitting: Emergency Medicine

## 2016-04-07 ENCOUNTER — Encounter: Payer: Self-pay | Admitting: Emergency Medicine

## 2016-04-07 DIAGNOSIS — S12490D Other displaced fracture of fifth cervical vertebra, subsequent encounter for fracture with routine healing: Secondary | ICD-10-CM | POA: Diagnosis not present

## 2016-04-07 DIAGNOSIS — M797 Fibromyalgia: Secondary | ICD-10-CM | POA: Diagnosis not present

## 2016-04-07 DIAGNOSIS — I739 Peripheral vascular disease, unspecified: Secondary | ICD-10-CM | POA: Diagnosis not present

## 2016-04-07 DIAGNOSIS — G20 Parkinson's disease: Secondary | ICD-10-CM | POA: Diagnosis not present

## 2016-04-07 DIAGNOSIS — M1990 Unspecified osteoarthritis, unspecified site: Secondary | ICD-10-CM | POA: Diagnosis not present

## 2016-04-07 DIAGNOSIS — I10 Essential (primary) hypertension: Secondary | ICD-10-CM | POA: Diagnosis not present

## 2016-04-07 MED ORDER — AZITHROMYCIN 250 MG PO TABS: ORAL_TABLET | ORAL | 0 refills | Status: DC

## 2016-04-07 MED ORDER — ALBUTEROL SULFATE HFA 108 (90 BASE) MCG/ACT IN AERS: 2.0000 | INHALATION_SPRAY | Freq: Four times a day (QID) | RESPIRATORY_TRACT | 0 refills | Status: DC | PRN

## 2016-04-07 MED FILL — AZITHROMYCIN 250 MG TABLET: 250 | 5 days supply | Qty: 6 | Fill #0

## 2016-04-07 MED FILL — VENTOLIN HFA 90 MCG INHALER: 108 (90 BAS | 30 days supply | Qty: 18 | Fill #0

## 2016-04-07 NOTE — Telephone Encounter (Signed)
-----   Message from Emi Holes, [place] sent at 04/07/2016  9:59 AM EST ----- Nurse from [facility] called to report INR of 3.6 and PT of 42.8. Also reported that pt is currently taking 4 mg daily of Warfarin.

## 2016-04-07 NOTE — Telephone Encounter (Signed)
Fax sent to [facility] as requested.

## 2016-04-07 NOTE — Telephone Encounter (Signed)
Have pended meds, cannot find pharmacy. Please put in correct pharmacy and approve.

## 2016-04-07 NOTE — Telephone Encounter (Signed)
Pt called and stated she was in to see you the beginning of the week. She isnt feeling any better and still coughing. She is wondering if you can call her in an inhaler and antibiotic. Pharmacy is near the [facility] on [address] not [address]. Patient was unaware of Pharmacy name. Please advise thanks.

## 2016-04-07 NOTE — Telephone Encounter (Signed)
Pt called back and the Pharmacy is [facility], [place] Thanks.

## 2016-04-07 NOTE — Telephone Encounter (Signed)
Currently taking coumadin 4 mgdaily  = 28 mg a week  Her last INR on 1/15 was 2.4- we did not change her dose at that time  Will have her decrease her dose by approx 10%- take 2mg  2 days and 4 mg the other days = 24 mg  She lives at [facility] [employer] [employer]- will need to call them and give this new dosing order

## 2016-04-07 NOTE — Telephone Encounter (Signed)
Spoke with Rollene Fare from [facility] and gave her this dosing order.  Will also fax an order to [facility].   Tanesha please print out and fax letter thank you!

## 2016-04-14 ENCOUNTER — Telehealth: Payer: Self-pay | Admitting: Internal Medicine

## 2016-04-14 NOTE — Telephone Encounter (Signed)
Please give her a call back- this type of cough syrup cannot be refilled except with a paper rx. I would recommend that she use OTC delsym for cough. Also, I would recommend that she give the antibiotic more time as she just finished taking it and sinus disease generally takes a while to clear up.    I have not actually seen her for this illness- if she likes we could have her seen next week for a recheck.  Thank you!

## 2016-04-14 NOTE — Telephone Encounter (Signed)
The patient was wondering if she can get something stronger called into the pharmacy for her sinuses. The Z-Pac didn't work. She was also needing a refill on the cough syrup.   Send to: [place], [place] - [place] [phone] (Phone) [phone] (Fax)

## 2016-04-14 NOTE — Telephone Encounter (Signed)
Pt informed. Pt states that she does not feel Z-Pak has helped with sinuses. Provider recommendations discussed. Pt request f/u appt. Appt scheduled for 2/12.

## 2016-04-17 ENCOUNTER — Ambulatory Visit (INDEPENDENT_AMBULATORY_CARE_PROVIDER_SITE_OTHER): Payer: Medicare Other | Admitting: Family Medicine

## 2016-04-17 ENCOUNTER — Encounter: Payer: Self-pay | Admitting: Family Medicine

## 2016-04-17 VITALS — BP 120/62 | HR 102 | Temp 97.6°F | Ht 66.5 in | Wt 195.4 lb

## 2016-04-17 DIAGNOSIS — D6859 Other primary thrombophilia: Secondary | ICD-10-CM

## 2016-04-17 DIAGNOSIS — J309 Allergic rhinitis, unspecified: Secondary | ICD-10-CM | POA: Diagnosis not present

## 2016-04-17 DIAGNOSIS — R05 Cough: Secondary | ICD-10-CM | POA: Diagnosis not present

## 2016-04-17 DIAGNOSIS — M26622 Arthralgia of left temporomandibular joint: Secondary | ICD-10-CM | POA: Diagnosis not present

## 2016-04-17 DIAGNOSIS — R059 Cough, unspecified: Secondary | ICD-10-CM

## 2016-04-17 LAB — POCT INR: INR: 1.2

## 2016-04-17 MED ORDER — HYDROCODONE-HOMATROPINE 5-1.5 MG/5ML PO SYRP: 5.0000 mL | ORAL_SOLUTION | Freq: Three times a day (TID) | ORAL | 0 refills | Status: DC | PRN

## 2016-04-17 MED ORDER — CETIRIZINE HCL 10 MG PO TABS: 10.0000 mg | ORAL_TABLET | Freq: Every day | ORAL | 1 refills | Status: DC

## 2016-04-17 MED FILL — ALL DAY ALLERGY 10 MG TAB: 10 | 100 days supply | Qty: 100 | Fill #0

## 2016-04-17 MED FILL — HYDROCODONE-HOMATROPINE SYR: 5-1.5 | 8 days supply | Qty: 120 | Fill #0

## 2016-04-17 NOTE — Progress Notes (Signed)
Pre visit review using our clinic review tool, if applicable. No additional management support is needed unless otherwise documented below in the visit note. 

## 2016-04-17 NOTE — Patient Instructions (Addendum)
Stay away from heavy chewing (gum, meat, etc) over the next 1-2 weeks. Anti-inflammatories and heat can also be helpful.   If you start having fevers, shaking, or shortness of breath, let us know or seek care.  Take Coumadin 4 mg each day. Return in 1 week for nurse visit to have INR rechecked.

## 2016-04-17 NOTE — Progress Notes (Signed)
Chief Complaint  Patient presents with  . Follow-up    cough(product-yellow-greenish),sinus pressure,check (L) ear  . INR check    [name] [name] here for URI complaints.  Duration: A little over 1 mo Associated symptoms: clear nasal discharge, cough, sinus pain and ear pain Denies: sinus pain, itchy watery eyes, ear drainage, sore throat, shortness of breath and myalgia Treatment to date: Has been on a cough syrup, Augmentin and a Zpak.  Sick contacts: No  ROS:  Const: Denies fevers HEENT: As noted in HPI Lungs: No SOB  Past Medical History:  Diagnosis Date  . Arthritis   . Chronic insomnia   . Complication of anesthesia    severe itching night of surgery requiring meds- also couldnt swallow- throat "paralyzed""  . Fibromyalgia   . Glaucoma   . Hypercholesterolemia   . Hyperlipidemia   . Hypertension    PCP Dr Cari Caraway- [place]  05/15/11 with clearance and note on chart,    chest x ray, EKG 12/12 EPIC, eccho 7/11 EPIC  . Hypothyroidism    s/p graves disease  . Kidney stone   . PD (Parkinson's disease) ([place])   . Peripheral vascular disease ([place])    PULMONARY EMBOLUS x 2- 2011, 2012/ FOLLOWED BY DR ODOGWU-LOV 12/12 EPIC   PT [facility] WILL STOP COUMADIN 3/12 and begin LOVONOX 05/17/11 as previously instructed  . Sinus infection    at present- dr Honor Loh PA aware- was seen there and at PCP 05/10/11  . Thyroid disease    Hypothyroidism   Family History  Problem Relation Age of Onset  . Prostate cancer Father   . Stroke Father     BP 120/62 (BP Location: Left Arm, Patient Position: Sitting, Cuff Size: Normal)   Pulse (!) 102   Temp 97.6 F (36.4 C) (Oral)   Ht 5' 6.5" (1.689 m)   Wt 195 lb 6.4 oz (88.6 kg)   SpO2 96%   BMI 31.07 kg/m  General: Awake, alert, appears stated age HEENT: AT, [place], ears patent b/l and TM's neg, nares patent w/o discharge, pharynx pink and without exudates, MMM Neck: No masses or asymmetry Heart: RRR, no murmurs, no bruits Lungs: CTAB, no  accessory muscle use MSK: No jaw deviation or jaw clicking. +TTP over the L masseter. Psych: Age appropriate judgment and insight, normal mood and affect  Cough - Plan: HYDROcodone-homatropine (HYCODAN) 5-1.5 MG/5ML syrup  Arthralgia of left temporomandibular joint  Allergic rhinitis, unspecified chronicity, unspecified seasonality, unspecified trigger - Plan: cetirizine (ZYRTEC) 10 MG tablet  Hypercoagulable state ([place]) - Plan: POCT INR  Orders as above. Recommended continuing nasal spray and starting Zyrtec.  Stop chewing gum for the next 1-2 weeks, NSAIDs and heat.  Continue to push fluids, practice good hand hygiene, cover mouth when coughing. F/u prn or if she starts having fevers, shaking or SOB, let us know or seek immediate care. Pt voiced understanding and agreement to the plan.  [place], DO 04/17/16 1:05 PM

## 2016-04-19 ENCOUNTER — Ambulatory Visit: Payer: Medicare Other

## 2016-04-19 ENCOUNTER — Telehealth: Payer: Self-pay | Admitting: Family Medicine

## 2016-04-19 NOTE — Telephone Encounter (Signed)
Returned call from [place] from [place]. Informed Benjamine Mola that per Dr. Irene Limbo note from [facility] on 04/17/16 pt instructions are as follows: "Take Coumadin 4 mg each day. Return in 1 week for nurse visit to have INR checked."

## 2016-04-19 NOTE — Telephone Encounter (Signed)
Caller name: [facility]  Relation to [id number] [employer] Call back [phone] Pharmacy:  Reason for call: Nanine Means is requesting a call back for clarification on the dosage for coumadin states it shows 2 mg and 4mg  on the avs report, need clarification.

## 2016-04-25 ENCOUNTER — Ambulatory Visit (INDEPENDENT_AMBULATORY_CARE_PROVIDER_SITE_OTHER): Payer: Medicare Other

## 2016-04-25 DIAGNOSIS — Z7901 Long term (current) use of anticoagulants: Secondary | ICD-10-CM

## 2016-04-25 LAB — POCT INR: INR: 2.6

## 2016-04-25 NOTE — Patient Instructions (Signed)
Per Dr Charlett Blake patient to take 4mg  daily except on Saturdays take(1/2) 2 mg. Return for INR check in 1 week.   Appointment for next INR check scheduled for May 02, 2016.

## 2016-04-25 NOTE — Progress Notes (Signed)
Pre visit review using our clinic tool,if applicable. No additional management support is needed unless otherwise documented below in the visit note.   Patient in for INR check per orders from Dr. Lenna Sciara. [name]   INR today = 2.6 Goal is 2.0-2.5  Per Dr Charlett Blake patient to take 4mg  daily except on Saturdays take(1/2) 2 mg. Return for INR check in 1 week.

## 2016-04-29 ENCOUNTER — Encounter (HOSPITAL_COMMUNITY): Payer: Self-pay

## 2016-04-29 ENCOUNTER — Inpatient Hospital Stay (HOSPITAL_COMMUNITY)
Admission: EM | Admit: 2016-04-29 | Discharge: 2016-05-03 | DRG: 871 | Disposition: A | Discharge status: Home / Self Care | Payer: Medicare Other | Attending: Internal Medicine | Admitting: Internal Medicine

## 2016-04-29 ENCOUNTER — Emergency Department (HOSPITAL_COMMUNITY): Payer: Medicare Other

## 2016-04-29 ENCOUNTER — Inpatient Hospital Stay (HOSPITAL_COMMUNITY): Payer: Medicare Other

## 2016-04-29 DIAGNOSIS — I10 Essential (primary) hypertension: Secondary | ICD-10-CM | POA: Diagnosis not present

## 2016-04-29 DIAGNOSIS — Z79899 Other long term (current) drug therapy: Secondary | ICD-10-CM

## 2016-04-29 DIAGNOSIS — N390 Urinary tract infection, site not specified: Secondary | ICD-10-CM | POA: Diagnosis not present

## 2016-04-29 DIAGNOSIS — R278 Other lack of coordination: Secondary | ICD-10-CM | POA: Diagnosis not present

## 2016-04-29 DIAGNOSIS — Z9841 Cataract extraction status, right eye: Secondary | ICD-10-CM

## 2016-04-29 DIAGNOSIS — S0990XA Unspecified injury of head, initial encounter: Secondary | ICD-10-CM | POA: Diagnosis present

## 2016-04-29 DIAGNOSIS — M797 Fibromyalgia: Secondary | ICD-10-CM | POA: Diagnosis present

## 2016-04-29 DIAGNOSIS — R41841 Cognitive communication deficit: Secondary | ICD-10-CM | POA: Diagnosis not present

## 2016-04-29 DIAGNOSIS — E039 Hypothyroidism, unspecified: Secondary | ICD-10-CM

## 2016-04-29 DIAGNOSIS — N3001 Acute cystitis with hematuria: Secondary | ICD-10-CM | POA: Diagnosis not present

## 2016-04-29 DIAGNOSIS — S79912A Unspecified injury of left hip, initial encounter: Secondary | ICD-10-CM | POA: Diagnosis not present

## 2016-04-29 DIAGNOSIS — S8002XA Contusion of left knee, initial encounter: Secondary | ICD-10-CM | POA: Diagnosis present

## 2016-04-29 DIAGNOSIS — W06XXXA Fall from bed, initial encounter: Secondary | ICD-10-CM | POA: Diagnosis present

## 2016-04-29 DIAGNOSIS — Z9842 Cataract extraction status, left eye: Secondary | ICD-10-CM

## 2016-04-29 DIAGNOSIS — E785 Hyperlipidemia, unspecified: Secondary | ICD-10-CM | POA: Diagnosis present

## 2016-04-29 DIAGNOSIS — A4151 Sepsis due to Escherichia coli [E. coli]: Principal | ICD-10-CM | POA: Diagnosis present

## 2016-04-29 DIAGNOSIS — Z96652 Presence of left artificial knee joint: Secondary | ICD-10-CM | POA: Diagnosis present

## 2016-04-29 DIAGNOSIS — A419 Sepsis, unspecified organism: Secondary | ICD-10-CM

## 2016-04-29 DIAGNOSIS — S199XXA Unspecified injury of neck, initial encounter: Secondary | ICD-10-CM | POA: Diagnosis not present

## 2016-04-29 DIAGNOSIS — Z1612 Extended spectrum beta lactamase (ESBL) resistance: Secondary | ICD-10-CM | POA: Diagnosis present

## 2016-04-29 DIAGNOSIS — G9341 Metabolic encephalopathy: Secondary | ICD-10-CM | POA: Diagnosis present

## 2016-04-29 DIAGNOSIS — Z96659 Presence of unspecified artificial knee joint: Secondary | ICD-10-CM | POA: Diagnosis not present

## 2016-04-29 DIAGNOSIS — R2689 Other abnormalities of gait and mobility: Secondary | ICD-10-CM | POA: Diagnosis not present

## 2016-04-29 DIAGNOSIS — G20 Parkinson's disease: Secondary | ICD-10-CM | POA: Diagnosis present

## 2016-04-29 DIAGNOSIS — Z96651 Presence of right artificial knee joint: Secondary | ICD-10-CM | POA: Diagnosis present

## 2016-04-29 DIAGNOSIS — Z881 Allergy status to other antibiotic agents status: Secondary | ICD-10-CM | POA: Diagnosis not present

## 2016-04-29 DIAGNOSIS — M25552 Pain in left hip: Secondary | ICD-10-CM | POA: Diagnosis present

## 2016-04-29 DIAGNOSIS — R6510 Systemic inflammatory response syndrome (SIRS) of non-infectious origin without acute organ dysfunction: Secondary | ICD-10-CM

## 2016-04-29 DIAGNOSIS — Z888 Allergy status to other drugs, medicaments and biological substances status: Secondary | ICD-10-CM

## 2016-04-29 DIAGNOSIS — M25562 Pain in left knee: Secondary | ICD-10-CM | POA: Diagnosis not present

## 2016-04-29 DIAGNOSIS — S299XXA Unspecified injury of thorax, initial encounter: Secondary | ICD-10-CM | POA: Diagnosis not present

## 2016-04-29 DIAGNOSIS — M545 Low back pain, unspecified: Secondary | ICD-10-CM | POA: Diagnosis present

## 2016-04-29 DIAGNOSIS — Z7901 Long term (current) use of anticoagulants: Secondary | ICD-10-CM | POA: Diagnosis not present

## 2016-04-29 DIAGNOSIS — Z7951 Long term (current) use of inhaled steroids: Secondary | ICD-10-CM

## 2016-04-29 DIAGNOSIS — F339 Major depressive disorder, recurrent, unspecified: Secondary | ICD-10-CM | POA: Diagnosis not present

## 2016-04-29 DIAGNOSIS — R000 Tachycardia, unspecified: Secondary | ICD-10-CM | POA: Diagnosis not present

## 2016-04-29 DIAGNOSIS — F0390 Unspecified dementia without behavioral disturbance: Secondary | ICD-10-CM | POA: Diagnosis present

## 2016-04-29 DIAGNOSIS — I739 Peripheral vascular disease, unspecified: Secondary | ICD-10-CM | POA: Diagnosis present

## 2016-04-29 DIAGNOSIS — Z90710 Acquired absence of both cervix and uterus: Secondary | ICD-10-CM | POA: Diagnosis not present

## 2016-04-29 DIAGNOSIS — S8992XA Unspecified injury of left lower leg, initial encounter: Secondary | ICD-10-CM | POA: Diagnosis not present

## 2016-04-29 DIAGNOSIS — G20C Parkinsonism, unspecified: Secondary | ICD-10-CM | POA: Diagnosis present

## 2016-04-29 DIAGNOSIS — G8929 Other chronic pain: Secondary | ICD-10-CM | POA: Diagnosis not present

## 2016-04-29 DIAGNOSIS — Z86711 Personal history of pulmonary embolism: Secondary | ICD-10-CM | POA: Diagnosis not present

## 2016-04-29 DIAGNOSIS — Z66 Do not resuscitate: Secondary | ICD-10-CM | POA: Diagnosis present

## 2016-04-29 DIAGNOSIS — G4489 Other headache syndrome: Secondary | ICD-10-CM | POA: Diagnosis not present

## 2016-04-29 DIAGNOSIS — Z5181 Encounter for therapeutic drug level monitoring: Secondary | ICD-10-CM | POA: Diagnosis not present

## 2016-04-29 DIAGNOSIS — M160 Bilateral primary osteoarthritis of hip: Secondary | ICD-10-CM | POA: Diagnosis present

## 2016-04-29 DIAGNOSIS — Z87891 Personal history of nicotine dependence: Secondary | ICD-10-CM | POA: Diagnosis not present

## 2016-04-29 DIAGNOSIS — Z7982 Long term (current) use of aspirin: Secondary | ICD-10-CM

## 2016-04-29 DIAGNOSIS — E7800 Pure hypercholesterolemia, unspecified: Secondary | ICD-10-CM | POA: Diagnosis present

## 2016-04-29 HISTORY — DX: Systemic inflammatory response syndrome (sirs) of non-infectious origin without acute organ dysfunction: R65.10

## 2016-04-29 LAB — CBC WITH DIFFERENTIAL/PLATELET
Basophils Absolute: 0 10*3/uL (ref 0.0–0.1)
Basophils Relative: 0 %
EOS PCT: 0 %
Eosinophils Absolute: 0.1 10*3/uL (ref 0.0–0.7)
HCT: 38.7 % (ref 36.0–46.0)
Hemoglobin: 12.5 g/dL (ref 12.0–15.0)
LYMPHS ABS: 1 10*3/uL (ref 0.7–4.0)
LYMPHS PCT: 6 %
MCH: 28.2 pg (ref 26.0–34.0)
MCHC: 32.3 g/dL (ref 30.0–36.0)
MCV: 87.4 fL (ref 78.0–100.0)
MONO ABS: 0.6 10*3/uL (ref 0.1–1.0)
MONOS PCT: 3 %
Neutro Abs: 15.5 10*3/uL — ABNORMAL HIGH (ref 1.7–7.7)
Neutrophils Relative %: 91 %
PLATELETS: 259 10*3/uL (ref 150–400)
RBC: 4.43 MIL/uL (ref 3.87–5.11)
RDW: 13.2 % (ref 11.5–15.5)
WBC: 17.1 10*3/uL — ABNORMAL HIGH (ref 4.0–10.5)

## 2016-04-29 LAB — PHOSPHORUS: Phosphorus: 3.2 mg/dL (ref 2.5–4.6)

## 2016-04-29 LAB — BASIC METABOLIC PANEL
Anion gap: 12 (ref 5–15)
BUN: 15 mg/dL (ref 6–20)
CHLORIDE: 103 mmol/L (ref 101–111)
CO2: 26 mmol/L (ref 22–32)
Calcium: 9.6 mg/dL (ref 8.9–10.3)
Creatinine, Ser: 0.7 mg/dL (ref 0.44–1.00)
GFR calc Af Amer: >60 mL/min (ref >60)
GFR calc non Af Amer: >60 mL/min (ref >60)
GLUCOSE: 164 mg/dL — AB (ref 65–99)
POTASSIUM: 3.9 mmol/L (ref 3.5–5.1)
Sodium: 141 mmol/L (ref 135–145)

## 2016-04-29 LAB — URINALYSIS, ROUTINE W REFLEX MICROSCOPIC
Bilirubin Urine: NEGATIVE
Glucose, UA: NEGATIVE mg/dL
Ketones, ur: NEGATIVE mg/dL
Nitrite: POSITIVE — AB
PH: 6 (ref 5.0–8.0)
Protein, ur: NEGATIVE mg/dL
SPECIFIC GRAVITY, URINE: 1.008 (ref 1.005–1.030)

## 2016-04-29 LAB — HEPATIC FUNCTION PANEL
ALK PHOS: 112 U/L (ref 38–126)
ALT: 13 U/L — AB (ref 14–54)
AST: 29 U/L (ref 15–41)
Albumin: 2.9 g/dL — ABNORMAL LOW (ref 3.5–5.0)
BILIRUBIN DIRECT: 0.4 mg/dL (ref 0.1–0.5)
BILIRUBIN TOTAL: 1.5 mg/dL — AB (ref 0.3–1.2)
Indirect Bilirubin: 1.1 mg/dL — ABNORMAL HIGH (ref 0.3–0.9)
Total Protein: 5.7 g/dL — ABNORMAL LOW (ref 6.5–8.1)

## 2016-04-29 LAB — MAGNESIUM: Magnesium: 1.6 mg/dL — ABNORMAL LOW (ref 1.7–2.4)

## 2016-04-29 LAB — TROPONIN I

## 2016-04-29 LAB — PROTIME-INR
INR: 2.36
PROTHROMBIN TIME: 26.3 s — AB (ref 11.4–15.2)

## 2016-04-29 LAB — MRSA PCR SCREENING: MRSA BY PCR: NEGATIVE

## 2016-04-29 MED ORDER — PANTOPRAZOLE SODIUM 40 MG PO TBEC
40.0000 mg | DELAYED_RELEASE_TABLET | Freq: Every day | ORAL | Status: DC
  Administered 2016-04-29 – 2016-05-03 (×5): 40 mg via ORAL
  Filled 2016-04-29 (×5): qty 1

## 2016-04-29 MED ORDER — TELMISARTAN-HCTZ 40-12.5 MG PO TABS: 1.0000 | ORAL_TABLET | Freq: Every day | ORAL | Status: DC

## 2016-04-29 MED ORDER — CEFTRIAXONE SODIUM 1 G IJ SOLR
1.0000 g | Freq: Once | INTRAMUSCULAR | Status: AC
  Administered 2016-04-29: 1 g via INTRAVENOUS
  Filled 2016-04-29: qty 10

## 2016-04-29 MED ORDER — CARBIDOPA-LEVODOPA ER 23.75-95 MG PO CPCR: 1.0000 | ORAL_CAPSULE | Freq: Three times a day (TID) | ORAL | Status: DC

## 2016-04-29 MED ORDER — NICOTINE 14 MG/24HR TD PT24
14.0000 mg | MEDICATED_PATCH | Freq: Every day | TRANSDERMAL | Status: DC
  Administered 2016-04-29 – 2016-05-03 (×5): 14 mg via TRANSDERMAL
  Filled 2016-04-29 (×5): qty 1

## 2016-04-29 MED ORDER — SODIUM CHLORIDE 0.9 % IV SOLN
INTRAVENOUS | Status: DC
  Administered 2016-04-29 – 2016-04-30 (×3): via INTRAVENOUS

## 2016-04-29 MED ORDER — ACETAMINOPHEN 500 MG PO TABS
500.0000 mg | ORAL_TABLET | Freq: Four times a day (QID) | ORAL | Status: DC | PRN
  Administered 2016-04-30 – 2016-05-02 (×3): 500 mg via ORAL
  Filled 2016-04-29 (×3): qty 1

## 2016-04-29 MED ORDER — LEVOTHYROXINE SODIUM 75 MCG PO TABS
150.0000 ug | ORAL_TABLET | Freq: Every day | ORAL | Status: DC
  Administered 2016-04-29 – 2016-05-03 (×5): 150 ug via ORAL
  Filled 2016-04-29 (×5): qty 2

## 2016-04-29 MED ORDER — ZOLPIDEM TARTRATE 5 MG PO TABS
5.0000 mg | ORAL_TABLET | Freq: Every evening | ORAL | Status: DC | PRN
  Administered 2016-04-29 – 2016-05-02 (×4): 5 mg via ORAL
  Filled 2016-04-29 (×4): qty 1

## 2016-04-29 MED ORDER — FENTANYL CITRATE (PF) 100 MCG/2ML IJ SOLN
50.0000 ug | Freq: Once | INTRAMUSCULAR | Status: AC
  Administered 2016-04-29: 50 ug via INTRAVENOUS
  Filled 2016-04-29: qty 2

## 2016-04-29 MED ORDER — WARFARIN - PHARMACIST DOSING INPATIENT
Freq: Every day | Status: DC
  Administered 2016-04-30: 17:00:00

## 2016-04-29 MED ORDER — WARFARIN SODIUM 4 MG PO TABS
4.0000 mg | ORAL_TABLET | ORAL | Status: DC
  Administered 2016-04-30 – 2016-05-02 (×3): 4 mg via ORAL
  Filled 2016-04-29 (×4): qty 1

## 2016-04-29 MED ORDER — POTASSIUM CHLORIDE CRYS ER 20 MEQ PO TBCR
20.0000 meq | EXTENDED_RELEASE_TABLET | Freq: Every day | ORAL | Status: DC
  Administered 2016-04-29 – 2016-05-03 (×5): 20 meq via ORAL
  Filled 2016-04-29 (×5): qty 1

## 2016-04-29 MED ORDER — FLUTICASONE PROPIONATE 50 MCG/ACT NA SUSP
1.0000 | Freq: Every day | NASAL | Status: DC
  Administered 2016-04-29 – 2016-05-03 (×5): 1 via NASAL
  Filled 2016-04-29: qty 16

## 2016-04-29 MED ORDER — METHOCARBAMOL 500 MG PO TABS: 500.0000 mg | ORAL_TABLET | Freq: Four times a day (QID) | ORAL | Status: DC | PRN

## 2016-04-29 MED ORDER — AZELASTINE HCL 0.1 % NA SOLN
1.0000 | Freq: Two times a day (BID) | NASAL | Status: DC
  Administered 2016-04-29 – 2016-05-03 (×8): 1 via NASAL
  Filled 2016-04-29 (×2): qty 30

## 2016-04-29 MED ORDER — OXYCODONE HCL 5 MG PO TABS
5.0000 mg | ORAL_TABLET | Freq: Four times a day (QID) | ORAL | Status: DC | PRN
  Administered 2016-04-30 – 2016-05-01 (×2): 5 mg via ORAL
  Filled 2016-04-29 (×2): qty 1

## 2016-04-29 MED ORDER — SODIUM CHLORIDE 0.9 % IV BOLUS (SEPSIS)
500.0000 mL | Freq: Once | INTRAVENOUS | Status: AC
  Administered 2016-04-29: 500 mL via INTRAVENOUS

## 2016-04-29 MED ORDER — KETOROLAC TROMETHAMINE 15 MG/ML IJ SOLN: 15.0000 mg | Freq: Once | INTRAMUSCULAR | Status: DC

## 2016-04-29 MED ORDER — LORATADINE 10 MG PO TABS
10.0000 mg | ORAL_TABLET | Freq: Every day | ORAL | Status: DC
Start: 2016-04-29 — End: 2016-05-03
  Administered 2016-04-29 – 2016-05-03 (×5): 10 mg via ORAL
  Filled 2016-04-29 (×5): qty 1

## 2016-04-29 MED ORDER — SODIUM CHLORIDE 0.9% FLUSH
3.0000 mL | Freq: Two times a day (BID) | INTRAVENOUS | Status: DC
  Administered 2016-04-29 – 2016-05-01 (×4): 3 mL via INTRAVENOUS

## 2016-04-29 MED ORDER — MAGNESIUM OXIDE 400 (241.3 MG) MG PO TABS
200.0000 mg | ORAL_TABLET | Freq: Every day | ORAL | Status: DC
  Administered 2016-04-29 – 2016-05-03 (×5): 200 mg via ORAL
  Filled 2016-04-29 (×5): qty 1

## 2016-04-29 MED ORDER — CARBIDOPA-LEVODOPA 25-100 MG PO TABS
1.0000 | ORAL_TABLET | Freq: Three times a day (TID) | ORAL | Status: DC
  Administered 2016-04-29 – 2016-05-03 (×13): 1 via ORAL
  Filled 2016-04-29 (×13): qty 1

## 2016-04-29 MED ORDER — DEXTROSE 5 % IV SOLN
1.0000 g | INTRAVENOUS | Status: DC
  Administered 2016-04-30: 1 g via INTRAVENOUS
  Filled 2016-04-29: qty 10

## 2016-04-29 MED ORDER — HYDROCHLOROTHIAZIDE 12.5 MG PO CAPS: 12.5000 mg | ORAL_CAPSULE | Freq: Every day | ORAL | Status: DC

## 2016-04-29 MED ORDER — CELECOXIB 100 MG PO CAPS
100.0000 mg | ORAL_CAPSULE | Freq: Two times a day (BID) | ORAL | Status: DC
  Administered 2016-04-29 – 2016-05-03 (×8): 100 mg via ORAL
  Filled 2016-04-29 (×8): qty 1

## 2016-04-29 MED ORDER — SODIUM CHLORIDE 0.45 % IV SOLN
INTRAVENOUS | Status: DC
  Administered 2016-04-29: 06:00:00 via INTRAVENOUS

## 2016-04-29 MED ORDER — IRBESARTAN 300 MG PO TABS
150.0000 mg | ORAL_TABLET | Freq: Every day | ORAL | Status: DC
  Administered 2016-04-29 – 2016-05-03 (×5): 150 mg via ORAL
  Filled 2016-04-29 (×5): qty 1

## 2016-04-29 MED ORDER — ONDANSETRON HCL 4 MG/2ML IJ SOLN
4.0000 mg | Freq: Four times a day (QID) | INTRAMUSCULAR | Status: DC | PRN
  Administered 2016-04-29 – 2016-04-30 (×3): 4 mg via INTRAVENOUS
  Filled 2016-04-29 (×4): qty 2

## 2016-04-29 MED ORDER — ATORVASTATIN CALCIUM 10 MG PO TABS
10.0000 mg | ORAL_TABLET | Freq: Every day | ORAL | Status: DC
  Administered 2016-04-30 – 2016-05-02 (×3): 10 mg via ORAL
  Filled 2016-04-29 (×3): qty 1

## 2016-04-29 MED ORDER — HYDROCODONE-HOMATROPINE 5-1.5 MG/5ML PO SYRP
5.0000 mL | ORAL_SOLUTION | Freq: Three times a day (TID) | ORAL | Status: DC | PRN
  Administered 2016-04-29: 5 mL via ORAL
  Filled 2016-04-29: qty 5

## 2016-04-29 MED ORDER — MAGNESIUM SULFATE 2 GM/50ML IV SOLN
2.0000 g | Freq: Once | INTRAVENOUS | Status: AC
  Administered 2016-04-29: 2 g via INTRAVENOUS
  Filled 2016-04-29: qty 50

## 2016-04-29 MED ORDER — DULOXETINE HCL 60 MG PO CPEP
60.0000 mg | ORAL_CAPSULE | Freq: Every day | ORAL | Status: DC
  Administered 2016-04-29 – 2016-05-03 (×5): 60 mg via ORAL
  Filled 2016-04-29 (×5): qty 1

## 2016-04-29 MED ORDER — WARFARIN SODIUM 2 MG PO TABS
2.0000 mg | ORAL_TABLET | ORAL | Status: DC
  Administered 2016-04-29: 2 mg via ORAL
  Filled 2016-04-29: qty 1

## 2016-04-29 NOTE — H&P (Signed)
History and Physical    Treniece Kohlhoff [id number] DOB: [birth year] DOA: 04/29/2016  PCP: Lamar Blinks, MD   Patient coming from: [facility].  Chief Complaint: Fall.  HPI: Nataliyah Laszewski is a 71 y.o. female with medical history significant of osteoarthritis, chronic insomnia, fibromyalgia, chronic back pain, glaucoma, hyperlipidemia, hypertension, history of graves disease, hypothyroidism, urolithiasis, Parkinson's disease, peripheral vascular disease, pulmonary embolism 2 in 2011 and 2012 was coming from the nursing home facility with complaints of intermittent dizziness for several days, frequent falls this week and is status post fall earlier today. The patient states that she rolled out of bed and hit her head and left knee on the floor. She denies chest pain, dyspnea, palpitations, LOC, diaphoresis, PND, orthopnea or recent pitting edema lower extremities. She has subjective fever, chills, worsening fatigue, but denies sore throat, productive cough, abdominal pain, nausea, emesis, diarrhea or constipation. She denies dysuria or hematuria.  ED Course: WBC 17.1, hemoglobin 12.5 g/dL and platelet 259, her INR was 2.36. Her troponin level was normal. Urinalysis showed positive nitrites, large hemoglobin, moderate leukocyte esterase and TNTC WBC. BMP showed a glucose of 164, but was otherwise normal.   Imaging: Chest radiograph, left knee and hip x-ray, CT of the head without contrast and C-spine did not show any acute pathology or fracture, with the exception of left knee edema.  The patient received analgesics, IV fluids and Rocephin in the emergency department. She is feeling better.  Review of Systems: As per HPI otherwise 10 point review of systems negative.    Past Medical History:  Diagnosis Date  . Arthritis   . Chronic insomnia   . Complication of anesthesia    severe itching night of surgery requiring meds- also couldnt swallow- throat "paralyzed""  . Fibromyalgia    . Glaucoma   . Hypercholesterolemia   . Hyperlipidemia   . Hypertension    PCP Dr Cari Caraway- [place]  05/15/11 with clearance and note on chart,    chest x ray, EKG 12/12 EPIC, eccho 7/11 EPIC  . Hypothyroidism    s/p graves disease  . Kidney stone   . PD (Parkinson's disease) ([place])   . Peripheral vascular disease ([place])    PULMONARY EMBOLUS x 2- 2011, 2012/ FOLLOWED BY DR ODOGWU-LOV 12/12 EPIC   PT [facility] WILL STOP COUMADIN 3/12 and begin LOVONOX 05/17/11 as previously instructed  . Sinus infection    at present- dr Honor Loh PA aware- was seen there and at PCP 05/10/11  . Thyroid disease    Hypothyroidism    Past Surgical History:  Procedure Laterality Date  . ABDOMINAL HYSTERECTOMY    . APPENDECTOMY    . CATARACT EXTRACTION Bilateral   . CHOLECYSTECTOMY    . COLONOSCOPY    . EXPLORATORY LAPAROTOMY    . JOINT REPLACEMENT     left knee  7/12  . TOTAL KNEE ARTHROPLASTY  05/22/2011   Procedure: TOTAL KNEE ARTHROPLASTY;  Surgeon: Mauri Pole, MD;  Location: [facility];  Service: Orthopedics;  Laterality: Right;     reports that she quit smoking about 26 years ago. She smoked 1.00 pack per day. She has never used smokeless tobacco. She reports that she drinks alcohol. She reports that she does not use drugs.  Allergies  Allergen Reactions  . Crestor [Rosuvastatin Calcium] Other (See Comments)    Feeling very bad, aching all over.  . Erythromycin Hives  . Gabapentin Other (See Comments)    Blurred vision  . Pregabalin Other (See Comments)  Crossed vision.  . Carbidopa Other (See Comments)    Headaches, nausea, dizziness  . Macrobid [Nitrofurantoin] Hives  . Losartan Other (See Comments)    Family History  Problem Relation Age of Onset  . Prostate cancer Father   . Stroke Father     Prior to Admission medications   Medication Sig Start Date End Date Taking? Authorizing Provider  acetaminophen (TYLENOL) 500 MG tablet Take 500 mg by mouth every 6 (six) hours as needed for  mild pain.   Yes Historical Provider, MD  albuterol (PROVENTIL HFA;VENTOLIN HFA) 108 (90 Base) MCG/ACT inhaler Inhale 2 puffs into the lungs every 6 (six) hours as needed for wheezing or shortness of breath. 04/07/16  Yes Hoyt Koch, MD  atorvastatin (LIPITOR) 10 MG tablet Take 1 tablet (10 mg total) by mouth daily. 12/31/15  Yes Daniel J Angiulli, PA-C  azelastine (ASTELIN) 0.1 % nasal spray Place 1 spray into both nostrils 2 (two) times daily. Use in each nostril as directed   Yes Historical Provider, MD  Carbidopa-Levodopa ER (RYTARY) 23.75-95 MG CPCR Take 1 tablet by mouth 3 (three) times daily. 03/28/16  Yes Rebecca S Tat, DO  celecoxib (CELEBREX) 100 MG capsule Take 1 capsule (100 mg total) by mouth 2 (two) times daily. 12/31/15  Yes Daniel J Angiulli, PA-C  cetirizine (ZYRTEC) 10 MG tablet Take 1 tablet (10 mg total) by mouth daily. 04/17/16  Yes Crosby Oyster [place], DO  Cholecalciferol (VITAMIN D3) 5000 UNITS CAPS Take 5,000 Units by mouth daily.    Yes Historical Provider, MD  cyanocobalamin 500 MCG tablet Take 500 mcg by mouth daily.   Yes Historical Provider, MD  DULoxetine (CYMBALTA) 60 MG capsule Take 1 capsule (60 mg total) by mouth daily. 12/31/15  Yes Daniel J Angiulli, PA-C  fluticasone (FLONASE) 50 MCG/ACT nasal spray Place 1 spray into both nostrils daily.   Yes Historical Provider, MD  HYDROcodone-homatropine (HYCODAN) 5-1.5 MG/5ML syrup Take 5 mLs by mouth every 8 (eight) hours as needed for cough. 04/17/16  Yes Crosby Oyster Wendling, DO  magnesium oxide (MAG-OX) 400 (241.3 Mg) MG tablet Take 0.5 tablets (200 mg total) by mouth daily. 12/21/15  Yes Robbie Lis, MD  Menthol, Topical Analgesic, (BIOFREEZE EX) Apply 1 application topically 4 (four) times daily. Apply to affected area.   Yes Historical Provider, MD  methocarbamol (ROBAXIN) 500 MG tablet Take 500 mg by mouth every 6 (six) hours as needed for muscle spasms.   Yes Historical Provider, MD  omega-3 acid ethyl  esters (LOVAZA) 1 g capsule Take 1 g by mouth daily.    Yes Historical Provider, MD  potassium chloride SA (K-DUR,KLOR-CON) 20 MEQ tablet Take 1 tablet (20 mEq total) by mouth daily. 12/31/15  Yes Daniel J Angiulli, PA-C  SYNTHROID 150 MCG tablet Take 1 tablet (150 mcg total) by mouth daily before breakfast. 03/08/16  Yes [name] [name], MD  telmisartan-hydrochlorothiazide (MICARDIS HCT) 40-12.5 MG per tablet Take 1 tablet by mouth daily.  07/14/14  Yes Historical Provider, MD  warfarin (COUMADIN) 4 MG tablet Take 4 mg by mouth daily. 4 mg every day except on Saturday. Saturday, patient takes 2 mg. 02/26/16  Yes Historical Provider, MD  zolpidem (AMBIEN) 5 MG tablet Take 1 tablet (5 mg total) by mouth at bedtime. Use ONLY WHEN NEEDED for sleep. Does not have to be used daily 03/08/16  Yes [name] [name], MD  aspirin 81 MG tablet Take 81 mg by mouth daily after breakfast.  Historical Provider, MD  Oxycodone HCl 10 MG TABS Take 1/2 tablet daily as needed for neck pain. If pain persists may take another 1/2 tablet per day 03/15/16   Darreld Mclean, MD  warfarin (COUMADIN) 2 MG tablet Take 2 mg by mouth See admin instructions. Take 2 mg only on  Saturday. 04/14/16   Historical Provider, MD  warfarin (COUMADIN) 6 MG tablet  03/07/16   Historical Provider, MD    Physical Exam:  Constitutional: NAD, calm, comfortable Vitals:   04/29/16 0600 04/29/16 0606 04/29/16 0626 04/29/16 0638  BP: 103/58  (!) 122/51   Pulse: 94  96   Resp: 23  18   Temp:  97.6 F (36.4 C) 97.9 F (36.6 C)   TempSrc:  Oral Oral   SpO2: 96%  96%   Weight:    91.2 kg (201 lb)   Eyes: PERRL, lids and conjunctivae normal ENMT: Mucous membranes are mildly dry. Posterior pharynx clear of any exudate or lesions. Neck: normal, supple, no masses, no thyromegaly Respiratory: clear to auscultation bilaterally, no wheezing, no crackles. Normal respiratory effort. No accessory muscle use.  Cardiovascular: Regular rate and rhythm,  no murmurs / rubs / gallops. No extremity edema. 2+ pedal pulses. No carotid bruits.  Abdomen: Soft, no tenderness, no masses palpated. No hepatosplenomegaly. Bowel sounds positive.  Musculoskeletal: no clubbing / cyanosis. Left knee tenderness and decreased ROM, no contractures. Normal muscle tone.  Skin: Positive large ecchymosis on left knee area Neurologic: CN 2-12 grossly intact. Sensation intact, DTR normal. Basal tremor and generalized weakness.  Psychiatric: Normal judgment and insight. Alert and oriented x 3. Normal mood.    Labs on Admission: I have personally reviewed following labs and imaging studies  CBC:  Recent Labs Lab 04/29/16 0416  WBC 17.1*  NEUTROABS 15.5*  HGB 12.5  HCT 38.7  MCV 87.4  PLT [id number]   Basic Metabolic Panel:  Recent Labs Lab 04/29/16 0416  NA 141  K 3.9  CL 103  CO2 26  GLUCOSE 164*  BUN 15  CREATININE 0.70  CALCIUM 9.6   GFR: Estimated Creatinine Clearance: 72 mL/min (by C-G formula based on SCr of 0.7 mg/dL). Liver Function Tests: No results for input(s): AST, ALT, ALKPHOS, BILITOT, PROT, ALBUMIN in the last 168 hours. No results for input(s): LIPASE, AMYLASE in the last 168 hours. No results for input(s): AMMONIA in the last 168 hours. Coagulation Profile:  Recent Labs Lab 04/25/16 0950 04/29/16 0416  INR 2.6 2.36   Cardiac Enzymes:  Recent Labs Lab 04/29/16 0416  TROPONINI <0.03   BNP (last 3 results) No results for input(s): PROBNP in the last 8760 hours. HbA1C: No results for input(s): HGBA1C in the last 72 hours. CBG: No results for input(s): GLUCAP in the last 168 hours. Lipid Profile: No results for input(s): CHOL, HDL, LDLCALC, TRIG, CHOLHDL, LDLDIRECT in the last 72 hours. Thyroid Function Tests: No results for input(s): TSH, T4TOTAL, FREET4, T3FREE, THYROIDAB in the last 72 hours. Anemia Panel: No results for input(s): VITAMINB12, FOLATE, FERRITIN, TIBC, IRON, RETICCTPCT in the last 72 hours. Urine  analysis:    Component Value Date/Time   COLORURINE AMBER (A) 04/29/2016 0336   APPEARANCEUR CLEAR 04/29/2016 0336   LABSPEC 1.008 04/29/2016 0336   PHURINE 6.0 04/29/2016 0336   GLUCOSEU NEGATIVE 04/29/2016 0336   HGBUR LARGE (A) 04/29/2016 0336   BILIRUBINUR NEGATIVE 04/29/2016 0336   BILIRUBINUR negative 04/04/2016 [place] 04/29/2016 0336   PROTEINUR NEGATIVE 04/29/2016 0336  UROBILINOGEN 0.2 04/04/2016 1629   UROBILINOGEN 0.2 09/07/2012 1049   NITRITE POSITIVE (A) 04/29/2016 0336   LEUKOCYTESUR MODERATE (A) 04/29/2016 0336    Radiological Exams on Admission: Dg Chest 1 View  Result Date: 04/29/2016 CLINICAL DATA:  71 year old female with fall. EXAM: CHEST 1 VIEW COMPARISON:  Chest radiograph dated 12/17/2015 FINDINGS: The heart size and mediastinal contours are within normal limits. Both lungs are clear. The visualized skeletal structures are unremarkable. IMPRESSION: No active disease. Electronically Signed   By: Anner Crete M.D.   On: 04/29/2016 04:45   Ct Head Wo Contrast  Result Date: 04/29/2016 CLINICAL DATA:  71 year old female with fall and head injury. EXAM: CT HEAD WITHOUT CONTRAST CT CERVICAL SPINE WITHOUT CONTRAST TECHNIQUE: Multidetector CT imaging of the head and cervical spine was performed following the standard protocol without intravenous contrast. Multiplanar CT image reconstructions of the cervical spine were also generated. COMPARISON:  Head CT dated 12/20/2015 FINDINGS: CT HEAD FINDINGS Brain: There is stable moderate age-related atrophy and chronic microvascular ischemic changes. There is dilatation of the ventricles out of proportion with the sulci which may represent central volume loss versus normal pressure hydrocephalus. Clinical correlation is recommended. Incidentally note of a cavum septum pellucidum and cavum vergae. There is no acute intracranial hemorrhage. No mass effect or midline shift noted. No extra-axial fluid collection.  Vascular: No hyperdense vessel or unexpected calcification. Skull: Normal. Negative for fracture or focal lesion. Sinuses/Orbits: No acute finding. Other: None CT CERVICAL SPINE FINDINGS Alignment: No acute subluxation. Skull base and vertebrae: There is no acute fracture. The bones are osteopenic. Increased in the degree of C5 chronic compression deformity with anterior wedging compared to the prior CT. No retropulsed fragment. There is ankylosis of the left C2-C5 articular facets. Soft tissues and spinal canal: No prevertebral fluid or swelling. No visible canal hematoma. Disc levels: Mild multilevel disc disease. Disc space narrowing most prominent at C6-7. Upper chest: Thyroidectomy changes. No adenopathy. The visualized portions of the upper lungs are clear. Atherosclerotic calcification of the aortic arch. Other: None IMPRESSION: 1. No acute intracranial hemorrhage. 2. Age-related atrophy and chronic microvascular ischemic changes. 3. No acute/traumatic cervical spine pathology. Electronically Signed   By: Anner Crete M.D.   On: 04/29/2016 04:27   Ct Cervical Spine Wo Contrast  Result Date: 04/29/2016 CLINICAL DATA:  71 year old female with fall and head injury. EXAM: CT HEAD WITHOUT CONTRAST CT CERVICAL SPINE WITHOUT CONTRAST TECHNIQUE: Multidetector CT imaging of the head and cervical spine was performed following the standard protocol without intravenous contrast. Multiplanar CT image reconstructions of the cervical spine were also generated. COMPARISON:  Head CT dated 12/20/2015 FINDINGS: CT HEAD FINDINGS Brain: There is stable moderate age-related atrophy and chronic microvascular ischemic changes. There is dilatation of the ventricles out of proportion with the sulci which may represent central volume loss versus normal pressure hydrocephalus. Clinical correlation is recommended. Incidentally note of a cavum septum pellucidum and cavum vergae. There is no acute intracranial hemorrhage. No mass  effect or midline shift noted. No extra-axial fluid collection. Vascular: No hyperdense vessel or unexpected calcification. Skull: Normal. Negative for fracture or focal lesion. Sinuses/Orbits: No acute finding. Other: None CT CERVICAL SPINE FINDINGS Alignment: No acute subluxation. Skull base and vertebrae: There is no acute fracture. The bones are osteopenic. Increased in the degree of C5 chronic compression deformity with anterior wedging compared to the prior CT. No retropulsed fragment. There is ankylosis of the left C2-C5 articular facets. Soft tissues and spinal canal: No  prevertebral fluid or swelling. No visible canal hematoma. Disc levels: Mild multilevel disc disease. Disc space narrowing most prominent at C6-7. Upper chest: Thyroidectomy changes. No adenopathy. The visualized portions of the upper lungs are clear. Atherosclerotic calcification of the aortic arch. Other: None IMPRESSION: 1. No acute intracranial hemorrhage. 2. Age-related atrophy and chronic microvascular ischemic changes. 3. No acute/traumatic cervical spine pathology. Electronically Signed   By: Anner Crete M.D.   On: 04/29/2016 04:27   Dg Knee Complete 4 Views Left  Result Date: 04/29/2016 CLINICAL DATA:  71 year old female with fall and left knee pain. EXAM: LEFT KNEE - COMPLETE 4+ VIEW COMPARISON:  Left knee MRI dated 01/19/2007 FINDINGS: There is a total left knee arthroplasty. The arthroplasty components appear intact and in anatomic alignment. No evidence of hardware loosening. There is no acute fracture or dislocation. The bones are osteopenic. No significant joint effusion. There is subcutaneous soft tissue edema anterior to the knee and anterior to the patellar tendon. IMPRESSION: 1. No acute fracture or dislocation. 2. Total left knee arthroplasty appears intact. 3. Anterior knee subcutaneous edema. Clinical correlation is recommended. Electronically Signed   By: Anner Crete M.D.   On: 04/29/2016 04:42   Dg Hip  Unilat W Or Wo Pelvis 2-3 Views Left  Result Date: 04/29/2016 CLINICAL DATA:  71 year old female with fall and left hip pain. EXAM: DG HIP (WITH OR WITHOUT PELVIS) 2-3V LEFT COMPARISON:  None. FINDINGS: There is no acute fracture or dislocation. The bones are osteopenic. Mild osteoarthritic changes of the hips. The soft tissues appear unremarkable. IMPRESSION: No acute fracture or dislocation. Electronically Signed   By: Anner Crete M.D.   On: 04/29/2016 04:44    EKG: Independently reviewed. Vent. rate 102 BPM PR interval * ms QRS duration 87 ms QT/QTc 381/497 ms P-R-T axes 52 76 36 Sinus tachycardia Borderline prolonged QT interval.  Assessment/Plan Principal Problem:   UTI (urinary tract infection)   SIRS (systemic inflammatory response syndrome) (HCC) Admit to telemetry unit/inpatient. Supplemental oxygen as needed. Continue IV fluids. Continue Rocephin 1 g every 24 hours. Follow-up cultures and sensitivity.  Active Problems:   S/P right TKA   Chronic low back pain   Fibromyalgia syndrome The patient sustained an injury on her left knee. Continue oxycodone as needed. Continue Cymbalta 60 mg by mouth daily. Continue Celebrex 200 mg by mouth daily. Start the patient on GI prophylaxis.    History of pulmonary embolism Continue warfarin per pharmacy. Daily PT/INR. Protonix 40 mg by mouth daily for GI prophylaxis.      Parkinson's disease ([place]) Continue carbidopa. Supportive care.    Benign essential HTN Continue Micardis HCT 40-12 0.5 mg by mouth daily. Monitor blood pressure, renal function and electrolytes.      Hyperlipidemia Continue the rest of the 10 mg by mouth daily. Check LFTs.    Hypothyroidism Continue levothyroxine and monitor TSH periodically.    DVT prophylaxis: Warfarin. Code Status: DO NOT RESUSCITATE/ DO NOT INTUBATE. Family Communication:  Disposition Plan: Admitted for IV antibiotics for 2-3 days. Analgesics as needed. Consults  called:  Admission status: Inpatient/telemetry.   Reubin Milan MD [facility] Pager [phone].  If 7PM-7AM, please contact night-coverage [email or web address] Password [facility]  04/29/2016, 6:56 AM

## 2016-04-29 NOTE — ED Notes (Signed)
Pt to CT and xray  

## 2016-04-29 NOTE — ED Triage Notes (Signed)
Per EMS: Pt rolled out of bed and struck head on floor. Pt on coumadin. Pt also complaining of recent bronchitis and some malodorous urine. Pt states fever last night. Pt a/o x 4. Pt from [facility]. Pt states on coumadin for PE's. VSS.

## 2016-04-29 NOTE — ED Notes (Signed)
Patient transported to CT 

## 2016-04-29 NOTE — ED Provider Notes (Signed)
[facility] DEPT Provider Note   CSN: [id number] Arrival date & time: 04/29/16  0244  By signing my name below, I, Dolores Hoose, attest that this documentation has been prepared under the direction and in the presence of Ripley Fraise, MD . Electronically Signed: Dolores Hoose, Scribe. 04/29/2016. 3:31 AM.  History   Chief Complaint Chief Complaint  Patient presents with  . Fall  . Dizziness   The history is provided by the patient. No language interpreter was used.  Fall  This is a new problem. The current episode started 3 to 5 hours ago. The problem occurs rarely. The problem has not changed since onset.Pertinent negatives include no chest pain and no abdominal pain. Nothing aggravates the symptoms. Nothing relieves the symptoms. She has tried nothing for the symptoms. The treatment provided no relief.  Dizziness  Quality:  Lightheadedness Severity:  Mild Timing:  Intermittent Progression:  Unchanged Chronicity:  New Context comment:  Fall Relieved by:  Nothing Worsened by:  Nothing Ineffective treatments:  None tried Associated symptoms: diarrhea   Associated symptoms: no chest pain     HPI Comments:  Pamla Demler is a 71 y.o. female with pmhx of Parkinsons, HTN, HLD and peripheral vascular disease who presents to the Emergency Department complaining of intermittent dizziness s/p fall earlier today. She notes that she rolled out of bed and hit her head on the floor. Pt states she also fell earlier this week, injuring and bruising her left leg. She reports associated hip pain from her fall today. Pt is currently compliant with her daily coumadin. She is a resident at [facility].   Pt secondarily complains of intermittent episodes of hematuria onset more than one week ago. Pt reports associated urinary frequency, malodorous urine and diarrhea. She has tried pyridium at home with minimal relief. Pt denies any chest pain or abdominal pain.   Past Medical History:    Diagnosis Date  . Arthritis   . Chronic insomnia   . Complication of anesthesia    severe itching night of surgery requiring meds- also couldnt swallow- throat "paralyzed""  . Fibromyalgia   . Glaucoma   . Hypercholesterolemia   . Hyperlipidemia   . Hypertension    PCP Dr Cari Caraway- [place]  05/15/11 with clearance and note on chart,    chest x ray, EKG 12/12 EPIC, eccho 7/11 EPIC  . Hypothyroidism    s/p graves disease  . Kidney stone   . PD (Parkinson's disease) ([place])   . Peripheral vascular disease ([place])    PULMONARY EMBOLUS x 2- 2011, 2012/ FOLLOWED BY DR ODOGWU-LOV 12/12 EPIC   PT STAES WILL STOP COUMADIN 3/12 and begin LOVONOX 05/17/11 as previously instructed  . Sinus infection    at present- dr Honor Loh PA aware- was seen there and at PCP 05/10/11  . Thyroid disease    Hypothyroidism    Patient Active Problem List   Diagnosis Date Noted  . Hematuria 04/05/2016  . Cough 04/05/2016  . Benign essential HTN   . Intractable pain 12/17/2015  . Displaced fracture of fifth cervical vertebra ([place]) 12/17/2015  . Gait disturbance 12/17/2015  . Trochanteric bursitis of both hips 09/13/2015  . Chronic low back pain 06/07/2015  . Fibromyalgia syndrome 05/17/2015  . Hypothyroidism 05/17/2015  . Parkinson's disease ([place]) 05/17/2015  . Hypercoagulable state ([place]) 02/07/2012  . History of pulmonary embolism 02/07/2012  . S/P right TKA 05/22/2011    Past Surgical History:  Procedure Laterality Date  . ABDOMINAL HYSTERECTOMY    .  APPENDECTOMY    . CATARACT EXTRACTION Bilateral   . CHOLECYSTECTOMY    . COLONOSCOPY    . EXPLORATORY LAPAROTOMY    . JOINT REPLACEMENT     left knee  7/12  . TOTAL KNEE ARTHROPLASTY  05/22/2011   Procedure: TOTAL KNEE ARTHROPLASTY;  Surgeon: Mauri Pole, MD;  Location: [facility] ORS;  Service: Orthopedics;  Laterality: Right;    OB History    No data available       Home Medications    Prior to Admission medications   Medication Sig Start Date End  Date Taking? Authorizing Provider  acetaminophen (TYLENOL) 500 MG tablet Take 500 mg by mouth every 6 (six) hours as needed for mild pain.    Historical Provider, MD  albuterol (PROVENTIL HFA;VENTOLIN HFA) 108 (90 Base) MCG/ACT inhaler Inhale 2 puffs into the lungs every 6 (six) hours as needed for wheezing or shortness of breath. 04/07/16   Hoyt Koch, MD  aspirin 81 MG tablet Take 81 mg by mouth daily after breakfast.     Historical Provider, MD  atorvastatin (LIPITOR) 10 MG tablet Take 1 tablet (10 mg total) by mouth daily. 12/31/15   Lavon Paganini Angiulli, PA-C  azelastine (ASTELIN) 0.1 % nasal spray Place 1 spray into both nostrils 2 (two) times daily. Use in each nostril as directed    Historical Provider, MD  Carbidopa-Levodopa ER (RYTARY) 23.75-95 MG CPCR Take 1 tablet by mouth 3 (three) times daily. 03/28/16   Eustace Quail Tat, DO  celecoxib (CELEBREX) 100 MG capsule Take 1 capsule (100 mg total) by mouth 2 (two) times daily. 12/31/15   Lavon Paganini Angiulli, PA-C  cetirizine (ZYRTEC) 10 MG tablet Take 1 tablet (10 mg total) by mouth daily. 04/17/16   Shelda Pal, DO  Cholecalciferol (VITAMIN D3) 5000 UNITS CAPS Take 5,000 Units by mouth daily.     Historical Provider, MD  cyanocobalamin 500 MCG tablet Take 500 mcg by mouth daily.    Historical Provider, MD  DULoxetine (CYMBALTA) 60 MG capsule Take 1 capsule (60 mg total) by mouth daily. 12/31/15   Daniel J Angiulli, PA-C  HYDROcodone-homatropine (HYCODAN) 5-1.5 MG/5ML syrup Take 5 mLs by mouth every 8 (eight) hours as needed for cough. 04/17/16   Shelda Pal, DO  magnesium oxide (MAG-OX) 400 (241.3 Mg) MG tablet Take 0.5 tablets (200 mg total) by mouth daily. 12/21/15   Robbie Lis, MD  Menthol, Topical Analgesic, (BIOFREEZE EX) Apply 1 application topically 4 (four) times daily. Apply to affected area.    Historical Provider, MD  methocarbamol (ROBAXIN) 500 MG tablet Take 500 mg by mouth every 6 (six) hours as needed for  muscle spasms.    Historical Provider, MD  omega-3 acid ethyl esters (LOVAZA) 1 g capsule Take 1 g by mouth daily.     Historical Provider, MD  Oxycodone HCl 10 MG TABS Take 1/2 tablet daily as needed for neck pain. If pain persists may take another 1/2 tablet per day 03/15/16   Darreld Mclean, MD  potassium chloride SA (K-DUR,KLOR-CON) 20 MEQ tablet Take 1 tablet (20 mEq total) by mouth daily. 12/31/15   Lavon Paganini Angiulli, PA-C  SYNTHROID 150 MCG tablet Take 1 tablet (150 mcg total) by mouth daily before breakfast. 03/08/16   [name] [name], MD  telmisartan-hydrochlorothiazide (MICARDIS HCT) 40-12.5 MG per tablet Take 1 tablet by mouth daily.  07/14/14   Historical Provider, MD  warfarin (COUMADIN) 2 MG tablet Take 1 tablet by mouth  daily. 04/14/16   Historical Provider, MD  warfarin (COUMADIN) 4 MG tablet Take 4 mg by mouth daily. 02/26/16   Historical Provider, MD  warfarin (COUMADIN) 6 MG tablet  03/07/16   Historical Provider, MD  zolpidem (AMBIEN) 5 MG tablet Take 1 tablet (5 mg total) by mouth at bedtime. Use ONLY WHEN NEEDED for sleep. Does not have to be used daily 03/08/16   Darreld Mclean, MD    Family History Family History  Problem Relation Age of Onset  . Prostate cancer Father   . Stroke Father     Social History Social History  Substance Use Topics  . Smoking status: Former Smoker    Packs/day: 1.00    Quit date: 05/10/1989  . Smokeless tobacco: Never Used  . Alcohol use 0.0 oz/week     Comment: one drink once a month     Allergies   Crestor [rosuvastatin calcium]; Erythromycin; Gabapentin; Pregabalin; Carbidopa; Macrobid [nitrofurantoin]; and Losartan   Review of Systems Review of Systems  Cardiovascular: Negative for chest pain.  Gastrointestinal: Positive for diarrhea. Negative for abdominal pain.  Genitourinary: Positive for frequency and hematuria.       Positive for Malodorous Urine  Musculoskeletal: Positive for arthralgias.  Neurological: Positive for  dizziness.  All other systems reviewed and are negative.    Physical Exam Updated Vital Signs BP 102/59   Pulse 103   Temp 98.2 F (36.8 C)   Resp 23   SpO2 95%   Physical Exam CONSTITUTIONAL: Elderly HEAD: Normocephalic/atraumatic EYES: EOMI/PERRL ENMT: Mucous membranes moist SPINE/BACK:cervical spine tenderness, no T/L tenderness noted CV: S1/S2 noted, no murmurs/rubs/gallops noted LUNGS: Lungs are clear to auscultation bilaterally, no apparent distress ABDOMEN: soft, nontender, no rebound or guarding, bowel sounds noted throughout abdomen GU:no cva tenderness NEURO: Pt is awake/alert/appropriate, moves all extremitiesx4.  No facial droop.   EXTREMITIES: pulses normal/equal, full ROM, tenderness and bruising to left knee, tenderness with ROM of left hip All other extremities/joints palpated/ranged and nontender SKIN: warm, color normal, bruising to left hip PSYCH: Flat affect   ED Treatments / Results  DIAGNOSTIC STUDIES:  Oxygen Saturation is 95% on RA, normal by my interpretation.    COORDINATION OF CARE:  3:29 AM Discussed treatment plan with pt at bedside and pt agreed to plan.  Labs (all labs ordered are listed, but only abnormal results are displayed) Labs Reviewed  BASIC METABOLIC PANEL - Abnormal; Notable for the following:       Result Value   Glucose, Bld 164 (*)    All other components within normal limits  CBC WITH DIFFERENTIAL/PLATELET - Abnormal; Notable for the following:    WBC 17.1 (*)    Neutro Abs 15.5 (*)    All other components within normal limits  URINALYSIS, ROUTINE W REFLEX MICROSCOPIC - Abnormal; Notable for the following:    Color, Urine AMBER (*)    Hgb urine dipstick LARGE (*)    Nitrite POSITIVE (*)    Leukocytes, UA MODERATE (*)    Bacteria, UA FEW (*)    Squamous Epithelial / LPF 0-5 (*)    All other components within normal limits  PROTIME-INR - Abnormal; Notable for the following:    Prothrombin Time 26.3 (*)    All  other components within normal limits  URINE CULTURE  TROPONIN I    EKG  EKG Interpretation  Date/Time:  Saturday April 29 2016 02:57:58 EST Ventricular Rate:  102 PR Interval:    QRS Duration: 87 QT Interval:  381 QTC Calculation: 497 R Axis:   76 Text Interpretation:  Sinus tachycardia Borderline prolonged QT interval Confirmed by Christy Gentles  MD, Bader Stubblefield ([id number]) on 04/29/2016 3:15:51 AM       Radiology Dg Chest 1 View  Result Date: 04/29/2016 CLINICAL DATA:  71 year old female with fall. EXAM: CHEST 1 VIEW COMPARISON:  Chest radiograph dated 12/17/2015 FINDINGS: The heart size and mediastinal contours are within normal limits. Both lungs are clear. The visualized skeletal structures are unremarkable. IMPRESSION: No active disease. Electronically Signed   By: Anner Crete M.D.   On: 04/29/2016 04:45   Ct Head Wo Contrast  Result Date: 04/29/2016 CLINICAL DATA:  71 year old female with fall and head injury. EXAM: CT HEAD WITHOUT CONTRAST CT CERVICAL SPINE WITHOUT CONTRAST TECHNIQUE: Multidetector CT imaging of the head and cervical spine was performed following the standard protocol without intravenous contrast. Multiplanar CT image reconstructions of the cervical spine were also generated. COMPARISON:  Head CT dated 12/20/2015 FINDINGS: CT HEAD FINDINGS Brain: There is stable moderate age-related atrophy and chronic microvascular ischemic changes. There is dilatation of the ventricles out of proportion with the sulci which may represent central volume loss versus normal pressure hydrocephalus. Clinical correlation is recommended. Incidentally note of a cavum septum pellucidum and cavum vergae. There is no acute intracranial hemorrhage. No mass effect or midline shift noted. No extra-axial fluid collection. Vascular: No hyperdense vessel or unexpected calcification. Skull: Normal. Negative for fracture or focal lesion. Sinuses/Orbits: No acute finding. Other: None CT CERVICAL SPINE  FINDINGS Alignment: No acute subluxation. Skull base and vertebrae: There is no acute fracture. The bones are osteopenic. Increased in the degree of C5 chronic compression deformity with anterior wedging compared to the prior CT. No retropulsed fragment. There is ankylosis of the left C2-C5 articular facets. Soft tissues and spinal canal: No prevertebral fluid or swelling. No visible canal hematoma. Disc levels: Mild multilevel disc disease. Disc space narrowing most prominent at C6-7. Upper chest: Thyroidectomy changes. No adenopathy. The visualized portions of the upper lungs are clear. Atherosclerotic calcification of the aortic arch. Other: None IMPRESSION: 1. No acute intracranial hemorrhage. 2. Age-related atrophy and chronic microvascular ischemic changes. 3. No acute/traumatic cervical spine pathology. Electronically Signed   By: Anner Crete M.D.   On: 04/29/2016 04:27   Ct Cervical Spine Wo Contrast  Result Date: 04/29/2016 CLINICAL DATA:  71 year old female with fall and head injury. EXAM: CT HEAD WITHOUT CONTRAST CT CERVICAL SPINE WITHOUT CONTRAST TECHNIQUE: Multidetector CT imaging of the head and cervical spine was performed following the standard protocol without intravenous contrast. Multiplanar CT image reconstructions of the cervical spine were also generated. COMPARISON:  Head CT dated 12/20/2015 FINDINGS: CT HEAD FINDINGS Brain: There is stable moderate age-related atrophy and chronic microvascular ischemic changes. There is dilatation of the ventricles out of proportion with the sulci which may represent central volume loss versus normal pressure hydrocephalus. Clinical correlation is recommended. Incidentally note of a cavum septum pellucidum and cavum vergae. There is no acute intracranial hemorrhage. No mass effect or midline shift noted. No extra-axial fluid collection. Vascular: No hyperdense vessel or unexpected calcification. Skull: Normal. Negative for fracture or focal lesion.  Sinuses/Orbits: No acute finding. Other: None CT CERVICAL SPINE FINDINGS Alignment: No acute subluxation. Skull base and vertebrae: There is no acute fracture. The bones are osteopenic. Increased in the degree of C5 chronic compression deformity with anterior wedging compared to the prior CT. No retropulsed fragment. There is ankylosis of the left C2-C5 articular facets. Soft  tissues and spinal canal: No prevertebral fluid or swelling. No visible canal hematoma. Disc levels: Mild multilevel disc disease. Disc space narrowing most prominent at C6-7. Upper chest: Thyroidectomy changes. No adenopathy. The visualized portions of the upper lungs are clear. Atherosclerotic calcification of the aortic arch. Other: None IMPRESSION: 1. No acute intracranial hemorrhage. 2. Age-related atrophy and chronic microvascular ischemic changes. 3. No acute/traumatic cervical spine pathology. Electronically Signed   By: Anner Crete M.D.   On: 04/29/2016 04:27   Dg Knee Complete 4 Views Left  Result Date: 04/29/2016 CLINICAL DATA:  71 year old female with fall and left knee pain. EXAM: LEFT KNEE - COMPLETE 4+ VIEW COMPARISON:  Left knee MRI dated 01/19/2007 FINDINGS: There is a total left knee arthroplasty. The arthroplasty components appear intact and in anatomic alignment. No evidence of hardware loosening. There is no acute fracture or dislocation. The bones are osteopenic. No significant joint effusion. There is subcutaneous soft tissue edema anterior to the knee and anterior to the patellar tendon. IMPRESSION: 1. No acute fracture or dislocation. 2. Total left knee arthroplasty appears intact. 3. Anterior knee subcutaneous edema. Clinical correlation is recommended. Electronically Signed   By: Anner Crete M.D.   On: 04/29/2016 04:42   Dg Hip Unilat W Or Wo Pelvis 2-3 Views Left  Result Date: 04/29/2016 CLINICAL DATA:  71 year old female with fall and left hip pain. EXAM: DG HIP (WITH OR WITHOUT PELVIS) 2-3V LEFT  COMPARISON:  None. FINDINGS: There is no acute fracture or dislocation. The bones are osteopenic. Mild osteoarthritic changes of the hips. The soft tissues appear unremarkable. IMPRESSION: No acute fracture or dislocation. Electronically Signed   By: Anner Crete M.D.   On: 04/29/2016 04:44    Procedures Procedures (including critical care time)  Medications Ordered in ED Medications  fentaNYL (SUBLIMAZE) injection 50 mcg (50 mcg Intravenous Given 04/29/16 0452)  cefTRIAXone (ROCEPHIN) 1 g in dextrose 5 % 50 mL IVPB (1 g Intravenous New Bag/Given 04/29/16 0450)     Initial Impression / Assessment and Plan / ED Course  I have reviewed the triage vital signs and the nursing notes.  Pertinent labs & imaging results that were available during my care of the patient were reviewed by me and considered in my medical decision making (see chart for details).     Pt with h/o parkinson with increased falls over past several days No acute traumatic injury noted She has UTI Will admit D/w dr Olevia Bowens for admission Pt agreeable with plan   Final Clinical Impressions(s) / ED Diagnoses   Final diagnoses:  Acute cystitis with hematuria    New Prescriptions New Prescriptions   No medications on file  I personally performed the services described in this documentation, which was scribed in my presence. The recorded information has been reviewed and is accurate.        Ripley Fraise, MD 04/29/16 [phone]

## 2016-04-29 NOTE — Progress Notes (Signed)
Pt had episode of N/V. Vomit yellow in color. MD notified and new order given for zofran. Zofran given per order.

## 2016-04-29 NOTE — Progress Notes (Signed)
Pharm notified this nurse that carbadopa-levadopa is not on hand. Pt reported she does not have anyone to bring medication from home. MD notified to see if dosage can be changed. Awaiting response.

## 2016-04-29 NOTE — Progress Notes (Signed)
Pt BP trending low, 95/44. MD, Cruzita Lederer, notified. New orders for 500cc bolus of NS as well as NS at 39ml/hr. BP reassessed. 107/59.

## 2016-04-29 NOTE — Progress Notes (Signed)
ANTICOAGULATION CONSULT NOTE - Initial Consult  Pharmacy Consult for Coumadin Indication: h/o PE  Allergies  Allergen Reactions  . Crestor [Rosuvastatin Calcium] Other (See Comments)    Feeling very bad, aching all over.  . Erythromycin Hives  . Gabapentin Other (See Comments)    Blurred vision  . Pregabalin Other (See Comments)    Crossed vision.  . Carbidopa Other (See Comments)    Headaches, nausea, dizziness  . Macrobid [Nitrofurantoin] Hives  . Losartan Other (See Comments)    Patient Measurements: Weight: 201 lb (91.2 kg)  Vital Signs: Temp: 97.9 F (36.6 C) (02/24 0626) Temp Source: Oral (02/24 0626) BP: 122/51 (02/24 0626) Pulse Rate: 96 (02/24 0626)  Labs:  Recent Labs  04/29/16 0416  HGB 12.5  HCT 38.7  PLT 259  LABPROT 26.3*  INR 2.36  CREATININE 0.70  TROPONINI <0.03    Estimated Creatinine Clearance: 72 mL/min (by C-G formula based on SCr of 0.7 mg/dL).   Medical History: Past Medical History:  Diagnosis Date  . Arthritis   . Chronic insomnia   . Complication of anesthesia    severe itching night of surgery requiring meds- also couldnt swallow- throat "paralyzed""  . Fibromyalgia   . Glaucoma   . Hypercholesterolemia   . Hyperlipidemia   . Hypertension    PCP Dr Cari Caraway- [place]  05/15/11 with clearance and note on chart,    chest x ray, EKG 12/12 EPIC, eccho 7/11 EPIC  . Hypothyroidism    s/p graves disease  . Kidney stone   . PD (Parkinson's disease) ([place])   . Peripheral vascular disease ([place])    PULMONARY EMBOLUS x 2- 2011, 2012/ FOLLOWED BY DR ODOGWU-LOV 12/12 EPIC   PT STAES WILL STOP COUMADIN 3/12 and begin LOVONOX 05/17/11 as previously instructed  . Sinus infection    at present- dr Honor Loh PA aware- was seen there and at PCP 05/10/11  . Thyroid disease    Hypothyroidism    Medications:  Prescriptions Prior to Admission  Medication Sig Dispense Refill Last Dose  . acetaminophen (TYLENOL) 500 MG tablet Take 500 mg by mouth  every 6 (six) hours as needed for mild pain.   Past Week at Unknown time  . albuterol (PROVENTIL HFA;VENTOLIN HFA) 108 (90 Base) MCG/ACT inhaler Inhale 2 puffs into the lungs every 6 (six) hours as needed for wheezing or shortness of breath. 1 Inhaler 0 04/18/2016 at Unknown time  . atorvastatin (LIPITOR) 10 MG tablet Take 1 tablet (10 mg total) by mouth daily. 30 tablet 1 04/29/2016 at Unknown time  . azelastine (ASTELIN) 0.1 % nasal spray Place 1 spray into both nostrils 2 (two) times daily. Use in each nostril as directed   Past Week at Unknown time  . Carbidopa-Levodopa ER (RYTARY) 23.75-95 MG CPCR Take 1 tablet by mouth 3 (three) times daily. 200 capsule 0 04/28/2016 at Unknown time  . celecoxib (CELEBREX) 100 MG capsule Take 1 capsule (100 mg total) by mouth 2 (two) times daily. 60 capsule 1 04/29/2016 at Unknown time  . cetirizine (ZYRTEC) 10 MG tablet Take 1 tablet (10 mg total) by mouth daily. 30 tablet 1 04/28/2016 at Unknown time  . Cholecalciferol (VITAMIN D3) 5000 UNITS CAPS Take 5,000 Units by mouth daily.    04/28/2016 at Unknown time  . cyanocobalamin 500 MCG tablet Take 500 mcg by mouth daily.   04/28/2016 at Unknown time  . DULoxetine (CYMBALTA) 60 MG capsule Take 1 capsule (60 mg total) by mouth daily. [place]  capsule 3 04/28/2016 at Unknown time  . fluticasone (FLONASE) 50 MCG/ACT nasal spray Place 1 spray into both nostrils daily.   04/28/2016 at Unknown time  . HYDROcodone-homatropine (HYCODAN) 5-1.5 MG/5ML syrup Take 5 mLs by mouth every 8 (eight) hours as needed for cough. 120 mL 0 04/28/2016 at Unknown time  . magnesium oxide (MAG-OX) 400 (241.3 Mg) MG tablet Take 0.5 tablets (200 mg total) by mouth daily. 30 tablet 0 04/28/2016 at Unknown time  . Menthol, Topical Analgesic, (BIOFREEZE EX) Apply 1 application topically 4 (four) times daily. Apply to affected area.   Past Week at Unknown time  . methocarbamol (ROBAXIN) 500 MG tablet Take 500 mg by mouth every 6 (six) hours as needed for muscle  spasms.   04/27/2016 at Unknown time  . omega-3 acid ethyl esters (LOVAZA) 1 g capsule Take 1 g by mouth daily.    04/28/2016 at Unknown time  . potassium chloride SA (K-DUR,KLOR-CON) 20 MEQ tablet Take 1 tablet (20 mEq total) by mouth daily. 30 tablet 0 04/28/2016 at Unknown time  . SYNTHROID 150 MCG tablet Take 1 tablet (150 mcg total) by mouth daily before breakfast. 90 tablet 3 04/28/2016 at Unknown time  . telmisartan-hydrochlorothiazide (MICARDIS HCT) 40-12.5 MG per tablet Take 1 tablet by mouth daily.   1 04/28/2016 at Unknown time  . warfarin (COUMADIN) 4 MG tablet Take 4 mg by mouth daily. 4 mg every day except on Saturday. Saturday, patient takes 2 mg.   04/29/2016 at 5p  . zolpidem (AMBIEN) 5 MG tablet Take 1 tablet (5 mg total) by mouth at bedtime. Use ONLY WHEN NEEDED for sleep. Does not have to be used daily 30 tablet 1 04/28/2016 at Unknown time  . aspirin 81 MG tablet Take 81 mg by mouth daily after breakfast.    Not Taking at Unknown time  . Oxycodone HCl 10 MG TABS Take 1/2 tablet daily as needed for neck pain. If pain persists may take another 1/2 tablet per day 30 tablet 0 unk  . warfarin (COUMADIN) 2 MG tablet Take 2 mg by mouth See admin instructions. Take 2 mg only on  Saturday.   not started  . warfarin (COUMADIN) 6 MG tablet    Not Taking at Unknown time   Scheduled:  . [START ON 04/30/2016] cefTRIAXone (ROCEPHIN) IVPB 1 gram/50 mL D5W  1 g Intravenous Q24H  . fentaNYL (SUBLIMAZE) injection  50 mcg Intravenous Once  . nicotine  14 mg Transdermal Daily  . pantoprazole  40 mg Oral Daily  . sodium chloride flush  3 mL Intravenous Q12H   Infusions:  . sodium chloride 100 mL/hr at 04/29/16 [id number]    Assessment: 71yo female admitted w/ increased falls w/ PD, no acute traumatic injury, CT head negative, to continue Coumadin for h/o PE; current INR at goal w/ last dose taken 2/23.  Goal of Therapy:  INR 2-3   Plan:  Will continue home Coumadin dose of 4mg  daily except 2mg  Sat and  monitor INR.  Wynona Neat, PharmD, BCPS  04/29/2016,6:58 AM

## 2016-04-29 NOTE — Progress Notes (Signed)
Patient seen and examined this morning, admitted overnight by Dr. Olevia Bowens, H&P reviewed and agree with the assessment and plan.  In brief, this is a pleasant 71 year old female with history of hypertension, hyperlipidemia, Graves' disease, hypothyroidism, Parkinson's disease, peripheral vascular disease, PE 2 on chronic anticoagulation, currently residing in assisted living, who presented with weakness dizziness for the past few days as well as a fall the day prior to admission.  She has had subjective fever, and complains of dysuria.  She was diagnosed with a UTI and admitted to the hospital.    Sepsis due to UTI -Patient was grossly positive urinalysis, significant leukocytosis with a white count of 17, tachypneic and tachycardic on admission.  Continue ceftriaxone, urine cultures were sent, awaiting results.  Parkinson's disease -Continue her home carbidopa levodopa  Hypertension -Continue Avapro, hold HCTZ as we will give patient hydration due to #1  Hyperthyroidism/Graves' disease >> now hypothyroidism -Continue Synthroid  History of PE -Continue Coumadin per pharmacy, INR is therapeutic.  Hyperlipidemia -Continue Lipitor  Status post fall -Likely a UTI, left knee with significant bruising and swelling, will need physical therapy to reevaluate, and make determination whether she will be able to go back to assisted living discharge will need to be in a rehab for short periods of time.  X-ray without any acute fractures or dislocation, and left knee arthroplasty appears intact. -Hip x-ray was negative for fractures -CT scan of the head as well as C-spine without any acute findings   Costin M. Cruzita Lederer, MD [facility] [phone]  If 7PM-7AM, please contact night-coverage [email or web address] Password [facility]  04/14/2016, 5:37 AM

## 2016-04-29 NOTE — Evaluation (Signed)
Physical Therapy Evaluation Patient Details Name: [name] [name] MRN: [id number] DOB: 05-01-1945 Today's Date: 04/29/2016   History of Present Illness  Patient is a 71 yo female admitted 04/29/16 with weakness, dizziness, and s/p fall with Lt knee edema.  Patient with UTI.   PMH:  hypertension, hyperlipidemia, Graves' disease, hypothyroidism, Parkinson's disease, peripheral vascular disease, PE 2  Clinical Impression  Patient presents with problems listed below.  Will benefit from acute PT to maximize functional mobility prior to discharge.  Patient at Mod I level pta, and requiring Mod assist for bed mobility today.  Recommend SNF at d/c for continued therapy.    Follow Up Recommendations SNF;Supervision/Assistance - 24 hour    Equipment Recommendations  None recommended by PT    Recommendations for Other Services       Precautions / Restrictions Precautions Precautions: Fall Precaution Comments: h/o falls Restrictions Weight Bearing Restrictions: No      Mobility  Bed Mobility Overal bed mobility: Needs Assistance Bed Mobility: Supine to Sit;Sit to Supine     Supine to sit: Mod assist;HOB elevated Sit to supine: Mod assist;HOB elevated   General bed mobility comments: Verbal cues for technique.  Required mod assist to move LE's off of bed and raise trunk to upright sitting position.  Once upright, patient required min -mod assist to maintain balance.  Cues to hold head upright.  Assist to control trunk and to bring LE's onto bed to return to supine.  Transfers                 General transfer comment: NT due to lethargy.  Ambulation/Gait                Stairs            Wheelchair Mobility    Modified Rankin (Stroke Patients Only)       Balance Overall balance assessment: Needs assistance;History of Falls Sitting-balance support: Single extremity supported;Feet supported Sitting balance-Leahy Scale: Poor   Postural control: Posterior lean                                   Pertinent Vitals/Pain Pain Assessment: Faces Faces Pain Scale: Hurts little more Pain Location: Lt knee Pain Descriptors / Indicators: Aching;Sore Pain Intervention(s): Limited activity within patient's tolerance;Monitored during session;Repositioned    Home Living Family/patient expects to be discharged to:: Assisted living               Home Equipment: Bedside commode;Walker - 4 wheels;Walker - 2 wheels Additional Comments: Patient reports she lives alone in an apartment at the [facility].      Prior Function Level of Independence: Independent with assistive device(s)         Comments: Patient reports she walks to dining room for meals at ALF.  Unable to obtain more information due to patient's lethargy.     Hand Dominance   Dominant Hand: Right    Extremity/Trunk Assessment   Upper Extremity Assessment Upper Extremity Assessment: Generalized weakness    Lower Extremity Assessment Lower Extremity Assessment: Generalized weakness;Difficult to assess due to impaired cognition    Cervical / Trunk Assessment Cervical / Trunk Assessment: Kyphotic  Communication   Communication: No difficulties  Cognition Arousal/Alertness: Lethargic Behavior During Therapy: Flat affect Overall Cognitive Status: No family/caregiver present to determine baseline cognitive functioning                 General  Comments: Patient laying in bed soaked in urine, with hospital gown pulled off of arms and chest.  Patient had not called for assistance.  Patient very slow to respond to questions.  Oriented x2.    General Comments      Exercises     Assessment/Plan    PT Assessment Patient needs continued PT services  PT Problem List Decreased strength;Decreased activity tolerance;Decreased balance;Decreased mobility;Decreased coordination;Decreased cognition;Decreased knowledge of use of DME;Decreased safety awareness;Pain       PT  Treatment Interventions DME instruction;Gait training;Functional mobility training;Therapeutic activities;Therapeutic exercise;Balance training;Patient/family education;Cognitive remediation    PT Goals (Current goals can be found in the Care Plan section)  Acute Rehab PT Goals Patient Stated Goal: To get stronger PT Goal Formulation: With patient Time For Goal Achievement: 05/13/16 Potential to Achieve Goals: Good    Frequency Min 3X/week   Barriers to discharge        Co-evaluation               End of Session   Activity Tolerance: Patient limited by lethargy Patient left: in bed;with call bell/phone within reach;with bed alarm set Nurse Communication: Mobility status (Recommend SNF at d/c) PT Visit Diagnosis: Other abnormalities of gait and mobility (R26.89);Muscle weakness (generalized) (M62.81);History of falling (Z91.81)         Time: [id number] PT Time Calculation (min) (ACUTE ONLY): 14 min   Charges:   PT Evaluation $PT Eval Moderate Complexity: 1 Procedure     PT G Codes:         Despina Pole 2016-05-09, 8:25 PM Carita Pian. Sanjuana Kava, [place] Pager [phone]

## 2016-04-29 NOTE — Progress Notes (Signed)
Pt arrived floor. Settled in the room. Vital signs stable. Cardiac telemetry initiated. MD in the room. Will continue to monitor.

## 2016-04-30 DIAGNOSIS — G8929 Other chronic pain: Secondary | ICD-10-CM

## 2016-04-30 DIAGNOSIS — M545 Low back pain: Secondary | ICD-10-CM

## 2016-04-30 LAB — CBC WITH DIFFERENTIAL/PLATELET
BASOS PCT: 0 %
Basophils Absolute: 0 10*3/uL (ref 0.0–0.1)
EOS ABS: 0.3 10*3/uL (ref 0.0–0.7)
EOS PCT: 1 %
HCT: 36.9 % (ref 36.0–46.0)
Hemoglobin: 11.8 g/dL — ABNORMAL LOW (ref 12.0–15.0)
LYMPHS ABS: 1.4 10*3/uL (ref 0.7–4.0)
Lymphocytes Relative: 5 %
MCH: 28 pg (ref 26.0–34.0)
MCHC: 32 g/dL (ref 30.0–36.0)
MCV: 87.4 fL (ref 78.0–100.0)
MONO ABS: 1.6 10*3/uL — AB (ref 0.1–1.0)
Monocytes Relative: 6 %
NEUTROS ABS: 24 10*3/uL — AB (ref 1.7–7.7)
NEUTROS PCT: 88 %
PLATELETS: 178 10*3/uL (ref 150–400)
RBC: 4.22 MIL/uL (ref 3.87–5.11)
RDW: 13.8 % (ref 11.5–15.5)
WBC: 27.3 10*3/uL — ABNORMAL HIGH (ref 4.0–10.5)

## 2016-04-30 LAB — BASIC METABOLIC PANEL
Anion gap: 8 (ref 5–15)
BUN: 21 mg/dL — AB (ref 6–20)
CALCIUM: 8.8 mg/dL — AB (ref 8.9–10.3)
CO2: 24 mmol/L (ref 22–32)
CREATININE: 0.89 mg/dL (ref 0.44–1.00)
Chloride: 106 mmol/L (ref 101–111)
GFR calc Af Amer: >60 mL/min (ref >60)
Glucose, Bld: 110 mg/dL — ABNORMAL HIGH (ref 65–99)
POTASSIUM: 3.7 mmol/L (ref 3.5–5.1)
SODIUM: 138 mmol/L (ref 135–145)

## 2016-04-30 LAB — PROTIME-INR
INR: 2.22
Prothrombin Time: 25 seconds — ABNORMAL HIGH (ref 11.4–15.2)

## 2016-04-30 MED ORDER — DEXTROSE 5 % IV SOLN
2.0000 g | Freq: Two times a day (BID) | INTRAVENOUS | Status: DC
  Administered 2016-04-30 – 2016-05-01 (×3): 2 g via INTRAVENOUS
  Filled 2016-04-30 (×3): qty 2

## 2016-04-30 MED ORDER — HYDROCODONE-ACETAMINOPHEN 5-325 MG PO TABS
1.0000 | ORAL_TABLET | ORAL | Status: DC | PRN
  Administered 2016-04-30 (×3): 1 via ORAL
  Filled 2016-04-30 (×4): qty 1

## 2016-04-30 MED ORDER — SODIUM CHLORIDE 0.9 % IV BOLUS (SEPSIS)
500.0000 mL | Freq: Once | INTRAVENOUS | Status: AC
  Administered 2016-04-30: 500 mL via INTRAVENOUS

## 2016-04-30 NOTE — Progress Notes (Signed)
Pt BP trending low, 90/35. Schorr, NP notified. New orders for 500cc bolus of NS. BP reassessed after administration and was 106/50.  Will continue to monitor patient.

## 2016-04-30 NOTE — Progress Notes (Signed)
[place] for Coumadin Indication: h/o PE  Allergies  Allergen Reactions  . Crestor [Rosuvastatin Calcium] Other (See Comments)    Feeling very bad, aching all over.  . Erythromycin Hives  . Gabapentin Other (See Comments)    Blurred vision  . Pregabalin Other (See Comments)    Crossed vision.  . Carbidopa Other (See Comments)    Headaches, nausea, dizziness  . Macrobid [Nitrofurantoin] Hives  . Losartan Other (See Comments)    Patient Measurements: Weight: 201 lb (91.2 kg)  Vital Signs: Temp: 98.2 F (36.8 C) (02/25 0552) Temp Source: Oral (02/25 0552) BP: 114/55 (02/25 0552) Pulse Rate: 95 (02/25 0552)  Labs:  Recent Labs  04/29/16 0416 04/30/16 0634  HGB 12.5 11.8*  HCT 38.7 36.9  PLT 259 178  LABPROT 26.3* 25.0*  INR 2.36 2.22  CREATININE 0.70 0.89  TROPONINI <0.03  --     Estimated Creatinine Clearance: 64.7 mL/min (by C-G formula based on SCr of 0.89 mg/dL).   Medical History: Past Medical History:  Diagnosis Date  . Arthritis   . Chronic insomnia   . Complication of anesthesia    severe itching night of surgery requiring meds- also couldnt swallow- throat "paralyzed""  . Fibromyalgia   . Glaucoma   . Hypercholesterolemia   . Hyperlipidemia   . Hypertension    PCP Dr Cari Caraway- [place]  05/15/11 with clearance and note on chart,    chest x ray, EKG 12/12 [facility], eccho 7/11 EP[facility]  . Hypothyroidism    s/p graves disease  . Kidney stone   . PD (Parkinson's disease) ([place])   . Peripheral vascular disease ([place])    PULMONARY EMBOLUS x 2- 2011, 2012/ FOLLOWED BY DR [id number] 12/12 EPIC   PT [facility] WILL STOP COUMADIN 3/12 and begin LOVONOX 05/17/11 as previously instructed  . Sinus infection    at present- dr Honor Loh PA aware- was seen there and at PCP 05/10/11  . Thyroid disease    Hypothyroidism    Scheduled:  . atorvastatin  10 mg Oral q1800  . azelastine  1 spray Each Nare BID  . carbidopa-levodopa  1 tablet  Oral TID  . ceFEPime (MAXIPIME) IV  2 g Intravenous Q12H  . celecoxib  100 mg Oral BID  . DULoxetine  60 mg Oral Daily  . fluticasone  1 spray Each Nare Daily  . irbesartan  150 mg Oral Daily  . levothyroxine  150 mcg Oral QAC breakfast  . loratadine  10 mg Oral Daily  . magnesium oxide  200 mg Oral Daily  . nicotine  14 mg Transdermal Daily  . pantoprazole  40 mg Oral Daily  . potassium chloride SA  20 mEq Oral Daily  . sodium chloride flush  3 mL Intravenous Q12H  . warfarin  2 mg Oral Q Sat-1800  . warfarin  4 mg Oral Once per day on Sun Mon Tue Wed Thu Fri  . Warfarin - Pharmacist Dosing Inpatient   Does not apply q1800    Assessment: 71yo female on Coumadin PTA for history of PE.  INR remains therapeutic today with stable CBC.   Goal of Therapy:  INR 2-3   Plan:  -Continue home Coumadin dose of 4mg  daily except 2 mg on Saturday -Daily INR  Vincenza Hews, PharmD, BCPS 04/30/2016, 10:50 AM

## 2016-04-30 NOTE — Progress Notes (Signed)
PROGRESS NOTE  [name] [name] [id number] DOB: Mar 04, 1946 DOA: 04/29/2016 PCP: Lamar Blinks, MD   LOS: 1 day   Brief Narrative: 71 year old female with history of hypertension, hyperlipidemia, Graves' disease, hypothyroidism, Parkinson's disease, peripheral vascular disease, PE 2 on chronic anticoagulation, currently residing in assisted living, who presented with weakness dizziness for the past few days as well as a fall the day prior to admission.  She has had subjective fever, and complains of dysuria.  She was diagnosed with a UTI and admitted to the hospital.  Assessment & Plan: Principal Problem:   UTI (urinary tract infection) Active Problems:   S/P right TKA   History of pulmonary embolism   Fibromyalgia syndrome   Parkinson's disease (HCC)   Chronic low back pain   Benign essential HTN   SIRS (systemic inflammatory response syndrome) (HCC)   Hyperlipidemia   Hypothyroidism   Sepsis due to UTI -Patient was grossly positive urinalysis, significant leukocytosis with a white count of 17, tachypneic and tachycardic on admission.   -Patient with intermittent hypotension yesterday, white count has increased to 20 7K today, she clinically looks improved however given increasing white count I will broaden her antibiotics to cefepime.  Urine cultures preliminarily showing gram-negative rods -Given normal blood pressure this morning, will attempt to hold fluids and encourage p.o. intake today  Parkinson's disease -Continue her home carbidopa levodopa  Hypertension -Continue Avapro, hold HCTZ as we will give patient hydration due to #1  Hyperthyroidism/Graves' disease >> now hypothyroidism -Continue Synthroid  History of PE -Continue Coumadin per pharmacy, INR is therapeutic.  Hyperlipidemia -Continue Lipitor  Status post fall -Likely a UTI, left knee with significant bruising and swelling, will need physical therapy to reevaluate, and make determination whether  she will be able to go back to assisted living discharge will need to be in a rehab for short periods of time.  X-ray without any acute fractures or dislocation, and left knee arthroplasty appears intact. -Hip x-ray was negative for fractures -CT scan of the head as well as C-spine without any acute findings   DVT prophylaxis: On Coumadin Code Status: DNR Family Communication: Discussed with daughter [name] over the phone (she is an Therapist, sports) Disposition Plan: To be determined  Consultants:   None  Procedures:  None  Antimicrobials:  Ceftriaxone 2/24-2/25  Cefepime 2/25-  Subjective: -Complains of a headache today, she has a history of migraine headaches, otherwise feels well, no chest pain, no shortness of breath.  No abdominal pain, has mild nausea due to headache but no vomiting  Objective: Vitals:   04/29/16 1759 04/30/16 0016 04/30/16 0136 04/30/16 0552  BP: (!) 107/59 (!) 90/35 (!) 106/50 (!) 114/55  Pulse:  95 91 95  Resp:  20  18  Temp:  98.2 F (36.8 C)  98.2 F (36.8 C)  TempSrc:  Oral  Oral  SpO2:  95%  92%  Weight:        Intake/Output Summary (Last 24 hours) at 04/30/16 0952 Last data filed at 04/30/16 0854  Gross per 24 hour  Intake          2645.42 ml  Output              150 ml  Net          2495.42 ml   Filed Weights   04/29/16 0638  Weight: 91.2 kg (201 lb)    Examination: Constitutional: NAD Vitals:   04/29/16 1759 04/30/16 0016 04/30/16 0136 04/30/16 0552  BP: (!) 107/59 Marland Kitchen)  90/35 (!) 106/50 (!) 114/55  Pulse:  95 91 95  Resp:  20  18  Temp:  98.2 F (36.8 C)  98.2 F (36.8 C)  TempSrc:  Oral  Oral  SpO2:  95%  92%  Weight:       Eyes: lids and conjunctivae normal ENMT: Mucous membranes are moist. No oropharyngeal exudates Neck: normal, supple, no masses, no thyromegaly Respiratory: clear to auscultation bilaterally, no wheezing, no crackles.  Cardiovascular: Regular rate and rhythm, no murmurs / rubs / gallops. No LE edema.    Abdomen: no tenderness. Bowel sounds positive.  Musculoskeletal: no clubbing / cyanosis.  Left knee swelling with overlying ecchymoses, ecchymosis on the medial aspect of the left thigh as well Skin: no rashes, lesions, ulcers.  Neurologic: CN 2-12 grossly intact. Strength 5/5 in all 4.  Psychiatric: Normal judgment and insight. Alert and oriented x 3. Normal mood.    Data Reviewed: I have personally reviewed following labs and imaging studies  CBC:  Recent Labs Lab 04/29/16 0416 04/30/16 0634  WBC 17.1* 27.3*  NEUTROABS 15.5* 24.0*  HGB 12.5 11.8*  HCT 38.7 36.9  MCV 87.4 87.4  PLT 259 0000000   Basic Metabolic Panel:  Recent Labs Lab 04/29/16 0416 04/29/16 0702 04/30/16 0634  NA 141  --  138  K 3.9  --  3.7  CL 103  --  106  CO2 26  --  24  GLUCOSE 164*  --  110*  BUN 15  --  21*  CREATININE 0.70  --  0.89  CALCIUM 9.6  --  8.8*  MG  --  1.6*  --   PHOS  --  3.2  --    GFR: Estimated Creatinine Clearance: 64.7 mL/min (by C-G formula based on SCr of 0.89 mg/dL). Liver Function Tests:  Recent Labs Lab 04/29/16 0702  AST 29  ALT 13*  ALKPHOS 112  BILITOT 1.5*  PROT 5.7*  ALBUMIN 2.9*   No results for input(s): LIPASE, AMYLASE in the last 168 hours. No results for input(s): AMMONIA in the last 168 hours. Coagulation Profile:  Recent Labs Lab 04/25/16 0950 04/29/16 0416 04/30/16 0634  INR 2.6 2.36 2.22   Cardiac Enzymes:  Recent Labs Lab 04/29/16 0416  TROPONINI <0.03   BNP (last 3 results) No results for input(s): PROBNP in the last 8760 hours. HbA1C: No results for input(s): HGBA1C in the last 72 hours. CBG: No results for input(s): GLUCAP in the last 168 hours. Lipid Profile: No results for input(s): CHOL, HDL, LDLCALC, TRIG, CHOLHDL, LDLDIRECT in the last 72 hours. Thyroid Function Tests: No results for input(s): TSH, T4TOTAL, FREET4, T3FREE, THYROIDAB in the last 72 hours. Anemia Panel: No results for input(s): VITAMINB12, FOLATE,  FERRITIN, TIBC, IRON, RETICCTPCT in the last 72 hours. Urine analysis:    Component Value Date/Time   COLORURINE AMBER (A) 04/29/2016 0336   APPEARANCEUR CLEAR 04/29/2016 0336   LABSPEC 1.008 04/29/2016 0336   PHURINE 6.0 04/29/2016 0336   GLUCOSEU NEGATIVE 04/29/2016 0336   HGBUR LARGE (A) 04/29/2016 0336   BILIRUBINUR NEGATIVE 04/29/2016 0336   BILIRUBINUR negative 04/04/2016 1629   KETONESUR NEGATIVE 04/29/2016 0336   PROTEINUR NEGATIVE 04/29/2016 0336   UROBILINOGEN 0.2 04/04/2016 1629   UROBILINOGEN 0.2 09/07/2012 1049   NITRITE POSITIVE (A) 04/29/2016 0336   LEUKOCYTESUR MODERATE (A) 04/29/2016 0336   Sepsis Labs: Invalid input(s): PROCALCITONIN, LACTICIDVEN  Recent Results (from the past 240 hour(s))  Urine culture     Status: Abnormal (  Preliminary result)   Collection Time: 04/29/16  5:47 AM  Result Value Ref Range Status   Specimen Description URINE, CLEAN CATCH  Final   Special Requests NONE  Final   Culture >=100,000 COLONIES/mL GRAM NEGATIVE RODS (A)  Final   Report Status PENDING  Incomplete  MRSA PCR Screening     Status: None   Collection Time: 04/29/16  6:26 AM  Result Value Ref Range Status   MRSA by PCR NEGATIVE NEGATIVE Final    Comment:        The GeneXpert MRSA Assay (FDA approved for NASAL specimens only), is one component of a comprehensive MRSA colonization surveillance program. It is not intended to diagnose MRSA infection nor to guide or monitor treatment for MRSA infections.       Radiology Studies: Dg Chest 1 View  Result Date: 04/29/2016 CLINICAL DATA:  71 year old female with fall. EXAM: CHEST 1 VIEW COMPARISON:  Chest radiograph dated 12/17/2015 FINDINGS: The heart size and mediastinal contours are within normal limits. Both lungs are clear. The visualized skeletal structures are unremarkable. IMPRESSION: No active disease. Electronically Signed   By: Anner Crete M.D.   On: 04/29/2016 04:45   Ct Head Wo Contrast  Result Date:  04/29/2016 CLINICAL DATA:  71 year old female with fall and head injury. EXAM: CT HEAD WITHOUT CONTRAST CT CERVICAL SPINE WITHOUT CONTRAST TECHNIQUE: Multidetector CT imaging of the head and cervical spine was performed following the standard protocol without intravenous contrast. Multiplanar CT image reconstructions of the cervical spine were also generated. COMPARISON:  Head CT dated 12/20/2015 FINDINGS: CT HEAD FINDINGS Brain: There is stable moderate age-related atrophy and chronic microvascular ischemic changes. There is dilatation of the ventricles out of proportion with the sulci which may represent central volume loss versus normal pressure hydrocephalus. Clinical correlation is recommended. Incidentally note of a cavum septum pellucidum and cavum vergae. There is no acute intracranial hemorrhage. No mass effect or midline shift noted. No extra-axial fluid collection. Vascular: No hyperdense vessel or unexpected calcification. Skull: Normal. Negative for fracture or focal lesion. Sinuses/Orbits: No acute finding. Other: None CT CERVICAL SPINE FINDINGS Alignment: No acute subluxation. Skull base and vertebrae: There is no acute fracture. The bones are osteopenic. Increased in the degree of C5 chronic compression deformity with anterior wedging compared to the prior CT. No retropulsed fragment. There is ankylosis of the left C2-C5 articular facets. Soft tissues and spinal canal: No prevertebral fluid or swelling. No visible canal hematoma. Disc levels: Mild multilevel disc disease. Disc space narrowing most prominent at C6-7. Upper chest: Thyroidectomy changes. No adenopathy. The visualized portions of the upper lungs are clear. Atherosclerotic calcification of the aortic arch. Other: None IMPRESSION: 1. No acute intracranial hemorrhage. 2. Age-related atrophy and chronic microvascular ischemic changes. 3. No acute/traumatic cervical spine pathology. Electronically Signed   By: Anner Crete M.D.   On:  04/29/2016 04:27   Ct Cervical Spine Wo Contrast  Result Date: 04/29/2016 CLINICAL DATA:  71 year old female with fall and head injury. EXAM: CT HEAD WITHOUT CONTRAST CT CERVICAL SPINE WITHOUT CONTRAST TECHNIQUE: Multidetector CT imaging of the head and cervical spine was performed following the standard protocol without intravenous contrast. Multiplanar CT image reconstructions of the cervical spine were also generated. COMPARISON:  Head CT dated 12/20/2015 FINDINGS: CT HEAD FINDINGS Brain: There is stable moderate age-related atrophy and chronic microvascular ischemic changes. There is dilatation of the ventricles out of proportion with the sulci which may represent central volume loss versus normal pressure hydrocephalus. Clinical  correlation is recommended. Incidentally note of a cavum septum pellucidum and cavum vergae. There is no acute intracranial hemorrhage. No mass effect or midline shift noted. No extra-axial fluid collection. Vascular: No hyperdense vessel or unexpected calcification. Skull: Normal. Negative for fracture or focal lesion. Sinuses/Orbits: No acute finding. Other: None CT CERVICAL SPINE FINDINGS Alignment: No acute subluxation. Skull base and vertebrae: There is no acute fracture. The bones are osteopenic. Increased in the degree of C5 chronic compression deformity with anterior wedging compared to the prior CT. No retropulsed fragment. There is ankylosis of the left C2-C5 articular facets. Soft tissues and spinal canal: No prevertebral fluid or swelling. No visible canal hematoma. Disc levels: Mild multilevel disc disease. Disc space narrowing most prominent at C6-7. Upper chest: Thyroidectomy changes. No adenopathy. The visualized portions of the upper lungs are clear. Atherosclerotic calcification of the aortic arch. Other: None IMPRESSION: 1. No acute intracranial hemorrhage. 2. Age-related atrophy and chronic microvascular ischemic changes. 3. No acute/traumatic cervical spine  pathology. Electronically Signed   By: Anner Crete M.D.   On: 04/29/2016 04:27   Dg Chest Port 1 View  Result Date: 04/29/2016 CLINICAL DATA:  Sepsis EXAM: PORTABLE CHEST 1 VIEW COMPARISON:  04/29/2016 and prior radiographs FINDINGS: The cardiomediastinal silhouette is unremarkable. There is no evidence of focal airspace disease, pulmonary edema, suspicious pulmonary nodule/mass, pleural effusion, or pneumothorax. No acute bony abnormalities are identified. IMPRESSION: No active disease. Electronically Signed   By: Margarette Canada M.D.   On: 04/29/2016 07:49   Dg Knee Complete 4 Views Left  Result Date: 04/29/2016 CLINICAL DATA:  71 year old female with fall and left knee pain. EXAM: LEFT KNEE - COMPLETE 4+ VIEW COMPARISON:  Left knee MRI dated 01/19/2007 FINDINGS: There is a total left knee arthroplasty. The arthroplasty components appear intact and in anatomic alignment. No evidence of hardware loosening. There is no acute fracture or dislocation. The bones are osteopenic. No significant joint effusion. There is subcutaneous soft tissue edema anterior to the knee and anterior to the patellar tendon. IMPRESSION: 1. No acute fracture or dislocation. 2. Total left knee arthroplasty appears intact. 3. Anterior knee subcutaneous edema. Clinical correlation is recommended. Electronically Signed   By: Anner Crete M.D.   On: 04/29/2016 04:42   Dg Hip Unilat W Or Wo Pelvis 2-3 Views Left  Result Date: 04/29/2016 CLINICAL DATA:  71 year old female with fall and left hip pain. EXAM: DG HIP (WITH OR WITHOUT PELVIS) 2-3V LEFT COMPARISON:  None. FINDINGS: There is no acute fracture or dislocation. The bones are osteopenic. Mild osteoarthritic changes of the hips. The soft tissues appear unremarkable. IMPRESSION: No acute fracture or dislocation. Electronically Signed   By: Anner Crete M.D.   On: 04/29/2016 04:44     Scheduled Meds: . atorvastatin  10 mg Oral q1800  . azelastine  1 spray Each Nare  BID  . carbidopa-levodopa  1 tablet Oral TID  . ceFEPime (MAXIPIME) IV  2 g Intravenous Q12H  . celecoxib  100 mg Oral BID  . DULoxetine  60 mg Oral Daily  . fluticasone  1 spray Each Nare Daily  . irbesartan  150 mg Oral Daily  . levothyroxine  150 mcg Oral QAC breakfast  . loratadine  10 mg Oral Daily  . magnesium oxide  200 mg Oral Daily  . nicotine  14 mg Transdermal Daily  . pantoprazole  40 mg Oral Daily  . potassium chloride SA  20 mEq Oral Daily  . sodium chloride flush  3 mL Intravenous Q12H  . warfarin  2 mg Oral Q Sat-1800  . warfarin  4 mg Oral Once per day on Sun Mon Tue Wed Thu Fri  . Warfarin - Pharmacist Dosing Inpatient   Does not apply q1800   Continuous Infusions:   Marzetta Board, MD, PhD [facility] Pager [phone]  If 7PM-7AM, please contact night-coverage [email or web address] Password [facility] 04/30/2016, 9:52 AM

## 2016-04-30 NOTE — Progress Notes (Signed)
Pharmacy Antibiotic Note  [name] [name] is a 71 y.o. female admitted on 04/29/2016 with UTI.  Pharmacy has been consulted for cefepime dosing. Patient was initially started on ceftriaxone, however she became hypotensive overnight and leukocytosis worsened so MD wants to broaden coverage until urine culture results (preliminary growing GNRs). CrCl ~ 65 ml/min  Plan: D/c ceftriaxone Start cefepime 2g IV Q12H Monitor renal function, clinical status, cultures   Weight: 201 lb (91.2 kg)  Temp (24hrs), Avg:98.7 F (37.1 C), Min:98.2 F (36.8 C), Max:99.5 F (37.5 C)   Recent Labs Lab 04/29/16 0416 04/30/16 0634  WBC 17.1* 27.3*  CREATININE 0.70 0.89    Estimated Creatinine Clearance: 64.7 mL/min (by C-G formula based on SCr of 0.89 mg/dL).    Allergies  Allergen Reactions  . Crestor [Rosuvastatin Calcium] Other (See Comments)    Feeling very bad, aching all over.  . Erythromycin Hives  . Gabapentin Other (See Comments)    Blurred vision  . Pregabalin Other (See Comments)    Crossed vision.  . Carbidopa Other (See Comments)    Headaches, nausea, dizziness  . Macrobid [Nitrofurantoin] Hives  . Losartan Other (See Comments)    Antimicrobials this admission: 2/24 ceftriaxone >> 2/25 2/25 cefepime >>   Microbiology results: 2/24 UCx: >100K colonies GNRs  2/24 MRSA PCR: negative  Thank you for allowing pharmacy to be a part of this patient's care.   Gwenlyn Perking, PharmD PGY1 Pharmacy Resident Pager: [phone] 04/30/2016 8:42 AM

## 2016-05-01 LAB — CBC WITH DIFFERENTIAL/PLATELET
Basophils Absolute: 0 10*3/uL (ref 0.0–0.1)
Basophils Relative: 0 %
Eosinophils Absolute: 0.3 10*3/uL (ref 0.0–0.7)
Eosinophils Relative: 2 %
HEMATOCRIT: 35.7 % — AB (ref 36.0–46.0)
HEMOGLOBIN: 11.7 g/dL — AB (ref 12.0–15.0)
LYMPHS ABS: 0.8 10*3/uL (ref 0.7–4.0)
LYMPHS PCT: 5 %
MCH: 28.5 pg (ref 26.0–34.0)
MCHC: 32.8 g/dL (ref 30.0–36.0)
MCV: 86.9 fL (ref 78.0–100.0)
MONOS PCT: 7 %
Monocytes Absolute: 1.3 10*3/uL — ABNORMAL HIGH (ref 0.1–1.0)
NEUTROS PCT: 86 %
Neutro Abs: 16.1 10*3/uL — ABNORMAL HIGH (ref 1.7–7.7)
Platelets: 163 10*3/uL (ref 150–400)
RBC: 4.11 MIL/uL (ref 3.87–5.11)
RDW: 13.7 % (ref 11.5–15.5)
WBC: 18.5 10*3/uL — ABNORMAL HIGH (ref 4.0–10.5)

## 2016-05-01 LAB — URINE CULTURE: Culture: >=100000 — AB

## 2016-05-01 LAB — PROTIME-INR
INR: 2.23
Prothrombin Time: 25.1 seconds — ABNORMAL HIGH (ref 11.4–15.2)

## 2016-05-01 MED ORDER — SODIUM CHLORIDE 0.9 % IV SOLN
1.0000 g | Freq: Once | INTRAVENOUS | Status: AC
  Administered 2016-05-01: 1 g via INTRAVENOUS
  Filled 2016-05-01: qty 1

## 2016-05-01 MED ORDER — SODIUM CHLORIDE 0.9 % IV SOLN
1.0000 g | Freq: Three times a day (TID) | INTRAVENOUS | Status: DC
  Administered 2016-05-01 – 2016-05-03 (×6): 1 g via INTRAVENOUS
  Filled 2016-05-01 (×7): qty 1

## 2016-05-01 MED ORDER — SODIUM CHLORIDE 0.9% FLUSH: 10.0000 mL | INTRAVENOUS | Status: DC | PRN

## 2016-05-01 NOTE — Progress Notes (Signed)
Peripherally Inserted Central Catheter/Midline Placement  The IV Nurse has discussed with the patient and/or persons authorized to consent for the patient, the purpose of this procedure and the potential benefits and risks involved with this procedure.  The benefits include less needle sticks, lab draws from the catheter, and the patient may be discharged home with the catheter. Risks include, but not limited to, infection, bleeding, blood clot (thrombus formation), and puncture of an artery; nerve damage and irregular heartbeat and possibility to perform a PICC exchange if needed/ordered by physician.  Alternatives to this procedure were also discussed.  Bard Power PICC patient education guide, fact sheet on infection prevention and patient information card has been provided to patient /or left at bedside.    PICC/Midline Placement Documentation        [name] 05/01/2016, 4:37 PM

## 2016-05-01 NOTE — Progress Notes (Signed)
PROGRESS NOTE  [name] [name] [id number] DOB: 09/17/1945 DOA: 04/29/2016 PCP: Lamar Blinks, MD   LOS: 2 days   Brief Narrative: 71 year old female with history of hypertension, hyperlipidemia, Graves' disease, hypothyroidism, Parkinson's disease, peripheral vascular disease, PE 2 on chronic anticoagulation, currently residing in assisted living, who presented with weakness dizziness for the past few days as well as a fall the day prior to admission.  She has had subjective fever, and complains of dysuria.  She was diagnosed with a UTI and admitted to the hospital.  Assessment & Plan: Principal Problem:   UTI (urinary tract infection) Active Problems:   S/P right TKA   History of pulmonary embolism   Fibromyalgia syndrome   Parkinson's disease (HCC)   Chronic low back pain   Benign essential HTN   SIRS (systemic inflammatory response syndrome) (HCC)   Hyperlipidemia   Hypothyroidism   Sepsis due to UTI -Patient was grossly positive urinalysis, significant leukocytosis with a white count of 17, tachypneic and tachycardic on admission.   -Patient with intermittent hypotension on the day of admission, white count has increased to 27K on 2/25, she clinically looks improved however given increasing white count on antibiotics were broadened to cefepime.  Urine cultures preliminarily showing gram-negative rods, E. coli -Sepsis physiology significantly improved today she is no longer hypotensive, however still has significant leukocytosis of 18 K -Expect antibiogram by later today or tomorrow morning.  Once that is back in and leukocytosis improves, anticipate able to discharge to SNF tomorrow.  Consult Education officer, museum.  Parkinson's disease -Continue her home carbidopa levodopa  Hypertension -Continue Avapro, hold HCTZ for now  Hyperthyroidism/Graves' disease >> now hypothyroidism -Continue Synthroid  History of PE -Continue Coumadin per pharmacy, INR is  therapeutic.  Hyperlipidemia -Continue Lipitor  Status post fall -Likely a UTI, left knee with significant bruising and swelling, will need physical therapy to reevaluate, and make determination whether she will be able to go back to assisted living discharge will need to be in a rehab for short periods of time.  X-ray without any acute fractures or dislocation, and left knee arthroplasty appears intact. -Hip x-ray was negative for fractures -CT scan of the head as well as C-spine without any acute findings   DVT prophylaxis: On Coumadin Code Status: DNR Family Communication: Discussed with daughter [name] over the phone (she is an Therapist, sports) on 2/25 Disposition Plan: SNF tomorrow versus [facility] if they are able to provide SNF level of care  Consultants:   None  Procedures:  None  Antimicrobials:  Ceftriaxone 2/24-2/25  Cefepime 2/25-  Subjective: -She is feeling better this morning, no headaches, no chest pain or shortness of breath.  Objective: Vitals:   04/30/16 0552 04/30/16 1745 04/30/16 2135 05/01/16 0456  BP: (!) 114/55 (!) 125/59 (!) 133/53 (!) 127/55  Pulse: 95 96 (!) 101 91  Resp: 18 (!) 24 18 18   Temp: 98.2 F (36.8 C) 98.4 F (36.9 C) 98.5 F (36.9 C) 98.1 F (36.7 C)  TempSrc: Oral Oral Oral Oral  SpO2: 92% 96% 93% 93%  Weight:        Intake/Output Summary (Last 24 hours) at 05/01/16 0937 Last data filed at 05/01/16 [id number]  Gross per 24 hour  Intake              370 ml  Output              200 ml  Net  170 ml   Filed Weights   04/29/16 0638  Weight: 91.2 kg (201 lb)    Examination: Constitutional: NAD Vitals:   04/30/16 0552 04/30/16 1745 04/30/16 2135 05/01/16 0456  BP: (!) 114/55 (!) 125/59 (!) 133/53 (!) 127/55  Pulse: 95 96 (!) 101 91  Resp: 18 (!) 24 18 18   Temp: 98.2 F (36.8 C) 98.4 F (36.9 C) 98.5 F (36.9 C) 98.1 F (36.7 C)  TempSrc: Oral Oral Oral Oral  SpO2: 92% 96% 93% 93%  Weight:       Respiratory:  clear to auscultation bilaterally, no wheezing, no crackles.  Cardiovascular: Regular rate and rhythm, no murmurs / rubs / gallops. No LE edema.  Abdomen: no tenderness. Bowel sounds positive.  Musculoskeletal: no clubbing / cyanosis.  Left knee swelling with overlying ecchymoses, ecchymosis on the medial aspect of the left thigh as well Psychiatric: Normal judgment and insight. Alert and oriented x 3. Normal mood.    Data Reviewed: I have personally reviewed following labs and imaging studies  CBC:  Recent Labs Lab 04/29/16 0416 04/30/16 0634 05/01/16 0539  WBC 17.1* 27.3* 18.5*  NEUTROABS 15.5* 24.0* 16.1*  HGB 12.5 11.8* 11.7*  HCT 38.7 36.9 35.7*  MCV 87.4 87.4 86.9  PLT 259 178 [id number]   Basic Metabolic Panel:  Recent Labs Lab 04/29/16 0416 04/29/16 0702 04/30/16 0634  NA 141  --  138  K 3.9  --  3.7  CL 103  --  106  CO2 26  --  24  GLUCOSE 164*  --  110*  BUN 15  --  21*  CREATININE 0.70  --  0.89  CALCIUM 9.6  --  8.8*  MG  --  1.6*  --   PHOS  --  3.2  --    GFR: Estimated Creatinine Clearance: 64.7 mL/min (by C-G formula based on SCr of 0.89 mg/dL). Liver Function Tests:  Recent Labs Lab 04/29/16 0702  AST 29  ALT 13*  ALKPHOS 112  BILITOT 1.5*  PROT 5.7*  ALBUMIN 2.9*   No results for input(s): LIPASE, AMYLASE in the last 168 hours. No results for input(s): AMMONIA in the last 168 hours. Coagulation Profile:  Recent Labs Lab 04/25/16 0950 04/29/16 0416 04/30/16 0634 05/01/16 0539  INR 2.6 2.36 2.22 2.23   Cardiac Enzymes:  Recent Labs Lab 04/29/16 0416  TROPONINI <0.03   BNP (last 3 results) No results for input(s): PROBNP in the last 8760 hours. HbA1C: No results for input(s): HGBA1C in the last 72 hours. CBG: No results for input(s): GLUCAP in the last 168 hours. Lipid Profile: No results for input(s): CHOL, HDL, LDLCALC, TRIG, CHOLHDL, LDLDIRECT in the last 72 hours. Thyroid Function Tests: No results for input(s): TSH,  T4TOTAL, FREET4, T3FREE, THYROIDAB in the last 72 hours. Anemia Panel: No results for input(s): VITAMINB12, FOLATE, FERRITIN, TIBC, IRON, RETICCTPCT in the last 72 hours. Urine analysis:    Component Value Date/Time   COLORURINE AMBER (A) 04/29/2016 0336   APPEARANCEUR CLEAR 04/29/2016 0336   LABSPEC 1.008 04/29/2016 0336   PHURINE 6.0 04/29/2016 0336   GLUCOSEU NEGATIVE 04/29/2016 0336   HGBUR LARGE (A) 04/29/2016 0336   BILIRUBINUR NEGATIVE 04/29/2016 0336   BILIRUBINUR negative 04/04/2016 1629   KETONESUR NEGATIVE 04/29/2016 0336   PROTEINUR NEGATIVE 04/29/2016 0336   UROBILINOGEN 0.2 04/04/2016 1629   UROBILINOGEN 0.2 09/07/2012 1049   NITRITE POSITIVE (A) 04/29/2016 0336   LEUKOCYTESUR MODERATE (A) 04/29/2016 0336   Sepsis Labs: Invalid input(s):  PROCALCITONIN, LACTICIDVEN  Recent Results (from the past 240 hour(s))  Urine culture     Status: Abnormal (Preliminary result)   Collection Time: 04/29/16  5:47 AM  Result Value Ref Range Status   Specimen Description URINE, CLEAN CATCH  Final   Special Requests NONE  Final   Culture (A)  Final    >=100,000 COLONIES/mL ESCHERICHIA COLI SUSCEPTIBILITIES TO FOLLOW    Report Status PENDING  Incomplete  MRSA PCR Screening     Status: None   Collection Time: 04/29/16  6:26 AM  Result Value Ref Range Status   MRSA by PCR NEGATIVE NEGATIVE Final    Comment:        The GeneXpert MRSA Assay (FDA approved for NASAL specimens only), is one component of a comprehensive MRSA colonization surveillance program. It is not intended to diagnose MRSA infection nor to guide or monitor treatment for MRSA infections.       Radiology Studies: No results found.   Scheduled Meds: . atorvastatin  10 mg Oral q1800  . azelastine  1 spray Each Nare BID  . carbidopa-levodopa  1 tablet Oral TID  . ceFEPime (MAXIPIME) IV  2 g Intravenous Q12H  . celecoxib  100 mg Oral BID  . DULoxetine  60 mg Oral Daily  . fluticasone  1 spray Each Nare  Daily  . irbesartan  150 mg Oral Daily  . levothyroxine  150 mcg Oral QAC breakfast  . loratadine  10 mg Oral Daily  . magnesium oxide  200 mg Oral Daily  . nicotine  14 mg Transdermal Daily  . pantoprazole  40 mg Oral Daily  . potassium chloride SA  20 mEq Oral Daily  . sodium chloride flush  3 mL Intravenous Q12H  . warfarin  2 mg Oral Q Sat-1800  . warfarin  4 mg Oral Once per day on Sun Mon Tue Wed Thu Fri  . Warfarin - Pharmacist Dosing Inpatient   Does not apply q1800   Continuous Infusions:   Marzetta Board, MD, PhD [facility] Pager [phone]  If 7PM-7AM, please contact night-coverage [email or web address] Password [facility] 05/01/2016, 9:37 AM

## 2016-05-01 NOTE — NC FL2 (Signed)
Orient LEVEL OF CARE SCREENING TOOL     IDENTIFICATION  Patient Name: [name] [name] Birthdate: [birth year] Sex: female Admission Date (Current Location): 04/29/2016  [facility] and [place] Number:  Herbalist and Address:  The . [facility], [place] [address] [place], [place] [address]      Provider Number: [id number]  Attending Physician Name and Address:  Caren Griffins, MD  Relative Name and Phone Number:       Current Level of Care: Hospital Recommended Level of Care: [place] Prior Approval Number:    Date Approved/Denied:   PASRR Number: [id number]  Discharge Plan: SNF    Current Diagnoses: Patient Active Problem List   Diagnosis Date Noted  . UTI (urinary tract infection) 04/29/2016  . SIRS (systemic inflammatory response syndrome) ([place]) 04/29/2016  . Hyperlipidemia 04/29/2016  . Hypothyroidism 04/29/2016  . Hematuria 04/05/2016  . Cough 04/05/2016  . Benign essential HTN   . Intractable pain 12/17/2015  . Displaced fracture of fifth cervical vertebra ([place]) 12/17/2015  . Gait disturbance 12/17/2015  . Trochanteric bursitis of both hips 09/13/2015  . Chronic low back pain 06/07/2015  . Fibromyalgia syndrome 05/17/2015  . Thyroid disease 05/17/2015  . Parkinson's disease ([place]) 05/17/2015  . Hypercoagulable state ([place]) 02/07/2012  . History of pulmonary embolism 02/07/2012  . S/P right TKA 05/22/2011    Orientation RESPIRATION BLADDER Height & Weight     Self, Situation, Place  Normal Continent Weight: 201 lb (91.2 kg) Height:     BEHAVIORAL SYMPTOMS/MOOD NEUROLOGICAL BOWEL NUTRITION STATUS      Continent Diet (cardiac, carb modified)  AMBULATORY STATUS COMMUNICATION OF NEEDS Skin   Limited Assist Verbally Normal                       Personal Care Assistance Level of Assistance  Bathing, Dressing Bathing Assistance: Limited assistance   Dressing Assistance: Limited assistance      Functional Limitations Info             SPECIAL CARE FACTORS FREQUENCY  PT (By licensed PT), OT (By licensed OT)     PT Frequency: 5/wk OT Frequency: 5/wk            Contractures      Additional Factors Info  Code Status, Allergies, Isolation Precautions, Psychotropic Code Status Info: DNR Allergies Info: Crestor Rosuvastatin Calcium, Erythromycin, Gabapentin, Pregabalin, Carbidopa, Macrobid Nitrofurantoin, Losartan Psychotropic Info: cymbalta   Isolation Precautions Info: ESBL     Current Medications (05/01/2016):  This is the current hospital active medication list Current Facility-Administered Medications  Medication Dose Route Frequency Provider Last Rate Last Dose  . acetaminophen (TYLENOL) tablet 500 mg  500 mg Oral Q6H PRN Reubin Milan, MD   500 mg at 04/30/16 0716  . atorvastatin (LIPITOR) tablet 10 mg  10 mg Oral q1800 Reubin Milan, MD   10 mg at 04/30/16 1705  . azelastine (ASTELIN) 0.1 % nasal spray 1 spray  1 spray Each Nare BID Reubin Milan, MD   1 spray at 05/01/16 [phone]  . carbidopa-levodopa (SINEMET IR) 25-100 MG per tablet immediate release 1 tablet  1 tablet Oral TID Caren Griffins, MD   1 tablet at 05/01/16 0950  . celecoxib (CELEBREX) capsule 100 mg  100 mg Oral BID Reubin Milan, MD   100 mg at 05/01/16 0948  . DULoxetine (CYMBALTA) DR capsule 60 mg  60 mg Oral Daily Gerri Lins  Olevia Bowens, MD   60 mg at 05/01/16 0948  . fluticasone (FLONASE) 50 MCG/ACT nasal spray 1 spray  1 spray Each Nare Daily Reubin Milan, MD   1 spray at 05/01/16 [id number]  . HYDROcodone-acetaminophen (NORCO/VICODIN) 5-325 MG per tablet 1 tablet  1 tablet Oral Q4H PRN Caren Griffins, MD   1 tablet at 04/30/16 2207  . HYDROcodone-homatropine (HYCODAN) 5-1.5 MG/5ML syrup 5 mL  5 mL Oral Q8H PRN Reubin Milan, MD   5 mL at 04/29/16 2127  . irbesartan (AVAPRO) tablet 150 mg  150 mg Oral Daily Caren Griffins, MD   150 mg at 05/01/16 0948  . levothyroxine  (SYNTHROID, LEVOTHROID) tablet 150 mcg  150 mcg Oral QAC breakfast Reubin Milan, MD   150 mcg at 05/01/16 0825  . loratadine (CLARITIN) tablet 10 mg  10 mg Oral Daily Reubin Milan, MD   10 mg at 05/01/16 0948  . magnesium oxide (MAG-OX) tablet 200 mg  200 mg Oral Daily Reubin Milan, MD   200 mg at 05/01/16 0948  . meropenem (MERREM) 1 g in sodium chloride 0.9 % 100 mL IVPB  1 g Intravenous Q8H Lauren D Bajbus, RPH      . methocarbamol (ROBAXIN) tablet 500 mg  500 mg Oral Q6H PRN Reubin Milan, MD      . nicotine (NICODERM CQ - dosed in mg/24 hours) patch 14 mg  14 mg Transdermal Daily Reubin Milan, MD   14 mg at 05/01/16 0945  . ondansetron (ZOFRAN) injection 4 mg  4 mg Intravenous Q6H PRN Caren Griffins, MD   4 mg at 04/30/16 1702  . oxyCODONE (Oxy IR/ROXICODONE) immediate release tablet 5 mg  5 mg Oral Q6H PRN Reubin Milan, MD   5 mg at 04/30/16 1849  . pantoprazole (PROTONIX) EC tablet 40 mg  40 mg Oral Daily Reubin Milan, MD   40 mg at 05/01/16 0948  . potassium chloride SA (K-DUR,KLOR-CON) CR tablet 20 mEq  20 mEq Oral Daily Reubin Milan, MD   20 mEq at 05/01/16 0948  . sodium chloride flush (NS) 0.9 % injection 3 mL  3 mL Intravenous Q12H Reubin Milan, MD   3 mL at 05/01/16 0949  . warfarin (COUMADIN) tablet 2 mg  2 mg Oral Q Sat-1800 Veronda P Bryk, RPH   2 mg at 04/29/16 1659  . warfarin (COUMADIN) tablet 4 mg  4 mg Oral Once per day on Sun Mon Tue Wed Thu Fri Laren Everts, RPH   4 mg at 04/30/16 1706  . Warfarin - Pharmacist Dosing Inpatient   Does not apply q1800 Laren Everts, RPH      . zolpidem (AMBIEN) tablet 5 mg  5 mg Oral QHS PRN Reubin Milan, MD   5 mg at 05/01/16 0210     Discharge Medications: Please see discharge summary for a list of discharge medications.  Relevant Imaging Results:  Relevant Lab Results:   Additional Information SS#: [id number], pt will need 10 days of IV antibiotics (type to be  determined)  Mickal Meno, Connye Burkitt, LCSW

## 2016-05-01 NOTE — Clinical Social Work Note (Signed)
Clinical Social Work Assessment  Patient Details  Name: [name] [name] MRN: [id number] Date of Birth: 06/08/1945  Date of referral:  05/01/16               Reason for consult:  Facility Placement                Permission sought to share information with:  Facility Sport and exercise psychologist, Family Supports Permission granted to share information::  No (per MD pt disoriented)  Name::     Oncologist::  SNFs  Relationship::  dtr  Contact Information:     Housing/Transportation Living arrangements for the past 2 months:  [place] of Information:  Adult Children Patient Interpreter Needed:  None Criminal Activity/Legal Involvement Pertinent to Current Situation/Hospitalization:  No - Comment as needed Significant Relationships:  Adult Children Lives with:  Facility Resident Do you feel safe going back to the place where you live?    Need for family participation in patient care:     Care giving concerns:  Pt lives at [facility] but has big change in mobility/ mental status following UTI with sepsis.   Social Worker assessment / plan:  CSW spoke with pt dtr concerning PT recommendation for SNF and MD recommendation for SNF for IV antibiotics.  Pt dtr reports that pt has been to SNF in the past and is familiar with process.  Employment status:  Retired Forensic scientist:  [employer] PT Recommendations:  [place] / Referral to community resources:  [place]  Patient/Family's Response to care:  Pt dtr is agreeable to SNF stay- hopeful for [facility] where pt has been in the past.  Patient/Family's Understanding of and Emotional Response to Diagnosis, Current Treatment, and Prognosis:  Pt dtr very realistic about pt needs and hopeful pt will recover quickly with SNF stay.  Emotional Assessment Appearance:  Appears stated age Attitude/Demeanor/Rapport:  Unable to Assess Affect (typically observed):  Unable  to Assess Orientation:  Oriented to Self, Oriented to Place Alcohol / Substance use:  Not Applicable Psych involvement (Current and /or in the community):  No (Comment)  Discharge Needs  Concerns to be addressed:  Care Coordination Readmission within the last 30 days:  No Current discharge risk:  Physical Impairment Barriers to Discharge:  Continued Medical Work up   Jorge Ny, LCSW 05/01/2016, 3:54 PM

## 2016-05-01 NOTE — Progress Notes (Signed)
Physical Therapy Treatment Patient Details Name: [name] [name] MRN: [id number] DOB: 04-Jun-1945 Today's Date: 05/01/2016    History of Present Illness Patient is a 71 yo female admitted 04/29/16 with weakness, dizziness, and s/p fall with Lt knee edema.  Patient with UTI.   PMH:  hypertension, hyperlipidemia, Graves' disease, hypothyroidism, Parkinson's disease, peripheral vascular disease, PE 2    PT Comments    Pt performed increased activity, progressed to standing and gait outside of room.  Pt requiring min-mod assistance.  Pt will continue to benefit from ST SNF placement to receive rehab to improve strength before returning home.  Plan next session to continue to correct gait deviations and incorporate therapeutic exercise.    Follow Up Recommendations  SNF;Supervision/Assistance - 24 hour     Equipment Recommendations  None recommended by PT    Recommendations for Other Services       Precautions / Restrictions Precautions Precautions: Fall Precaution Comments: h/o falls Restrictions Weight Bearing Restrictions: No    Mobility  Bed Mobility Overal bed mobility: Needs Assistance Bed Mobility: Supine to Sit;Rolling;Sidelying to Sit Rolling: Min assist Sidelying to sit: Mod assist Supine to sit: Mod assist     General bed mobility comments: Pt required cues for technique and hand placement.  Rolling to R performed to improve positioning for perianal care.  Pt performed sidelying to sit to achieve sitting edge of bed.  Pt started to impulsively stand without cueing.   Transfers Overall transfer level: Needs assistance Equipment used: Rolling walker (2 wheeled) Transfers: Sit to/from Stand Sit to Stand: Min assist         General transfer comment: Cues for hand placement to and from seated surface.    Ambulation/Gait Ambulation/Gait assistance: Min assist;Mod assist Ambulation Distance (Feet): 140 Feet   Gait Pattern/deviations: Step-through pattern;Step-to  pattern;Shuffle;Decreased stride length;Staggering left;Drifts right/left   Gait velocity interpretation: Below normal speed for age/gender General Gait Details: Cues for RW positioning, postural awareness, increasing B stride length.  Assist for manuevring RW in halls and maintaining standing.  Patient fatigues as gait progresses.     Stairs            Wheelchair Mobility    Modified Rankin (Stroke Patients Only)       Balance Overall balance assessment: Needs assistance;History of Falls Sitting-balance support: Single extremity supported;Feet supported Sitting balance-Leahy Scale: Good       Standing balance-Leahy Scale: Poor                      Cognition Arousal/Alertness: Lethargic Behavior During Therapy: Flat affect Overall Cognitive Status: No family/caregiver present to determine baseline cognitive functioning                      Exercises      General Comments        Pertinent Vitals/Pain Pain Assessment: Faces Faces Pain Scale: Hurts little more Pain Location: L arm Pain Descriptors / Indicators: Aching;Sore Pain Intervention(s): Monitored during session;Repositioned;Heat applied    Home Living                      Prior Function            PT Goals (current goals can now be found in the care plan section) Acute Rehab PT Goals Patient Stated Goal: To get stronger Potential to Achieve Goals: Good Progress towards PT goals: Progressing toward goals    Frequency    Min 3X/week  PT Plan Current plan remains appropriate    Co-evaluation             End of Session Equipment Utilized During Treatment: Gait belt Activity Tolerance: Patient limited by lethargy Patient left: with call bell/phone within reach;in chair;with chair alarm set Nurse Communication: Mobility status PT Visit Diagnosis: Other abnormalities of gait and mobility (R26.89);Muscle weakness (generalized) (M62.81);History of falling  (Z91.81)     Time: [id number] PT Time Calculation (min) (ACUTE ONLY): 27 min  Charges:  $Gait Training: 8-22 mins $Therapeutic Activity: 8-22 mins                    G Codes:       Cristela Blue 05/08/16, 12:02 PM Governor Rooks, PTA pager [phone]

## 2016-05-01 NOTE — Progress Notes (Signed)
[place] for Coumadin Indication: h/o PE  Allergies  Allergen Reactions  . Crestor [Rosuvastatin Calcium] Other (See Comments)    Feeling very bad, aching all over.  . Erythromycin Hives  . Gabapentin Other (See Comments)    Blurred vision  . Pregabalin Other (See Comments)    Crossed vision.  . Carbidopa Other (See Comments)    Headaches, nausea, dizziness  . Macrobid [Nitrofurantoin] Hives  . Losartan Other (See Comments)    Patient Measurements: Weight: 201 lb (91.2 kg)  Vital Signs: Temp: 98.1 F (36.7 C) (02/26 0456) Temp Source: Oral (02/26 0456) BP: 127/55 (02/26 0456) Pulse Rate: 91 (02/26 0456)  Labs:  Recent Labs  04/29/16 0416 04/30/16 0634 05/01/16 0539  HGB 12.5 11.8* 11.7*  HCT 38.7 36.9 35.7*  PLT 259 178 163  LABPROT 26.3* 25.0* 25.1*  INR 2.36 2.22 2.23  CREATININE 0.70 0.89  --   TROPONINI <0.03  --   --     Estimated Creatinine Clearance: 64.7 mL/min (by C-G formula based on SCr of 0.89 mg/dL).  Assessment: 71yo female on Coumadin PTA for history of PE.  INR remains therapeutic today at 2.23. Hgb and plts are stable- no bleeding noted.  Goal of Therapy:  INR 2-3   Plan:  -Continue home Coumadin dose of 4mg  daily except 2 mg on Saturday -Daily INR  Zaineb Nowaczyk D. Shiann Kam, PharmD, BCPS Clinical Pharmacist Pager: [phone] 05/01/2016 8:40 AM

## 2016-05-01 NOTE — Progress Notes (Signed)
Pharmacy Antibiotic Note  [name] [name] is a 71 y.o. female admitted on 04/29/2016 with UTI.   Pharmacy has been consulted for meropenem dosing.  Patient was initially started on ceftriaxone, however she became hypotensive overnight 2/24 and leukocytosis worsened so coverage was broadened. This morning, urine culture returned with ESBL EColi. WBC still elevated, remains afebrile.  Discussed with Dr. Cruzita Lederer- plans for carbapenem IV for at least 24h, and then plans [name] discharge on Fosfomycin 3g PO q72h x3 doses with first dose being given prior [name] discharge.   Plan: Meropenem 1g IV q8h Follow clinical progression, renal function and discharge plans   Weight: 201 lb (91.2 kg)  Temp (24hrs), Avg:98.3 F (36.8 C), Min:98.1 F (36.7 C), Max:98.5 F (36.9 C)   Recent Labs Lab 04/29/16 0416 04/30/16 0634 05/01/16 0539  WBC 17.1* 27.3* 18.5*  CREATININE 0.70 0.89  --     Estimated Creatinine Clearance: 64.7 mL/min (by C-G formula based on SCr of 0.89 mg/dL).    Allergies  Allergen Reactions  . Crestor [Rosuvastatin Calcium] Other (See Comments)    Feeling very bad, aching all over.  . Erythromycin Hives  . Gabapentin Other (See Comments)    Blurred vision  . Pregabalin Other (See Comments)    Crossed vision.  . Carbidopa Other (See Comments)    Headaches, nausea, dizziness  . Macrobid [Nitrofurantoin] Hives  . Losartan Other (See Comments)    Antimicrobials this admission: Ceftriaxone 2/24>> 2/25 Cefepime 2/25>>2/26 Meropenem 2/26>>   Microbiology results: 2/24 UCx: >100K ESBL EColi 2/24 MRSA PCR: negative  Thank you for allowing pharmacy [name] be a part of this patient's care.  [name] [name] D. [name] [name], PharmD, BCPS Clinical Pharmacist Pager: [phone] 05/01/2016 10:39 AM

## 2016-05-02 ENCOUNTER — Ambulatory Visit: Payer: Medicare Other

## 2016-05-02 LAB — CBC
HEMATOCRIT: 37.2 % (ref 36.0–46.0)
Hemoglobin: 12.1 g/dL (ref 12.0–15.0)
MCH: 28.1 pg (ref 26.0–34.0)
MCHC: 32.5 g/dL (ref 30.0–36.0)
MCV: 86.3 fL (ref 78.0–100.0)
Platelets: 160 10*3/uL (ref 150–400)
RBC: 4.31 MIL/uL (ref 3.87–5.11)
RDW: 14 % (ref 11.5–15.5)
WBC: 9.8 10*3/uL (ref 4.0–10.5)

## 2016-05-02 LAB — PROTIME-INR
INR: 2.31
PROTHROMBIN TIME: 25.8 s — AB (ref 11.4–15.2)

## 2016-05-02 LAB — BASIC METABOLIC PANEL
Anion gap: 6 (ref 5–15)
BUN: 18 mg/dL (ref 6–20)
CALCIUM: 9 mg/dL (ref 8.9–10.3)
CO2: 24 mmol/L (ref 22–32)
CREATININE: 0.77 mg/dL (ref 0.44–1.00)
Chloride: 107 mmol/L (ref 101–111)
GFR calc Af Amer: >60 mL/min (ref >60)
GFR calc non Af Amer: >60 mL/min (ref >60)
GLUCOSE: 95 mg/dL (ref 65–99)
Potassium: 4.2 mmol/L (ref 3.5–5.1)
Sodium: 137 mmol/L (ref 135–145)

## 2016-05-02 LAB — TSH: TSH: 1.071 u[IU]/mL (ref 0.350–4.500)

## 2016-05-02 LAB — AMMONIA: AMMONIA: 12 umol/L (ref 9–35)

## 2016-05-02 LAB — GLUCOSE, CAPILLARY: GLUCOSE-CAPILLARY: 92 mg/dL (ref 65–99)

## 2016-05-02 MED ORDER — SODIUM CHLORIDE 0.9% FLUSH
10.0000 mL | Freq: Two times a day (BID) | INTRAVENOUS | Status: DC
  Administered 2016-05-02: 10 mL

## 2016-05-02 MED ORDER — SODIUM CHLORIDE 0.9% FLUSH: 10.0000 mL | INTRAVENOUS | Status: DC | PRN

## 2016-05-02 NOTE — Progress Notes (Signed)
Kept the picc line catheter for the iv team to come over to review it by the time iv team get here catheter has been discarded by the nurse tech I personally looked at it, it was intact site still looks dry and intact will continue to monitor pt

## 2016-05-02 NOTE — Progress Notes (Signed)
Went to pt room to check up on her found out that pt has pulled her picc line out saw small blood on that arm that has the picc line and on the sheet, on call practitioner schorr has been informed as well as iv team, site has been wrapped with dry guase and a guase  Bandage will continue to monitor pt

## 2016-05-02 NOTE — Progress Notes (Addendum)
PROGRESS NOTE  [name] [name] [id number] DOB: 1945-10-05 DOA: 04/29/2016 PCP: Lamar Blinks, MD   LOS: 3 days   Brief Narrative: 71 year old female with history of hypertension, hyperlipidemia, Graves' disease, hypothyroidism, Parkinson's disease, peripheral vascular disease, PE 2 on chronic anticoagulation, currently residing in assisted living, who presented with weakness dizziness for the past few days as well as a fall the day prior to admission.  She has had subjective fever, and complains of dysuria.  She was diagnosed with a UTI and admitted to the hospital.  Assessment & Plan: Principal Problem:   UTI (urinary tract infection) Active Problems:   S/P right TKA   History of pulmonary embolism   Fibromyalgia syndrome   Parkinson's disease (HCC)   Chronic low back pain   Benign essential HTN   SIRS (systemic inflammatory response syndrome) (HCC)   Hyperlipidemia   Hypothyroidism    Sepsis due to UTI -Patient was grossly positive urinalysis, significant leukocytosis with a white count of 17, tachypneic and tachycardic on admission, started on ceftriaxone -Patient with intermittent hypotension on the day of admission, white count increased to 27K on 2/25, she was broadened to cefepime. -Unfortunately urine culture speciated on 2/26, with ESBL E. coli, and was started on meropenem, with plans for 10 days. Discussed with ID over the phone, place PICC line - White count improving  Acute encephalopathy -Likely in the setting of #1, patient with history of memory problems, suspect underlying mild dementia.  She is confused this morning and she pulled out her PICC line which was placed just yesterday on 2/26. -Replace PICC line today, if stable and PICC line remains in place she would be able to be discharged to SNF tomorrow.    Parkinson's disease -Continue her home carbidopa levodopa  Hypertension -Continue Avapro, hold HCTZ for now  Hyperthyroidism/Graves' disease >> now  hypothyroidism -Continue Synthroid  History of PE -Continue Coumadin per pharmacy, INR is therapeutic.  Hyperlipidemia -Continue Lipitor  Status post fall -left knee with significant bruising and swelling, physical therapy evaluated patient and recommended SNF, patient agreeable -Hip x-ray was negative for fractures -CT scan of the head as well as C-spine without any acute findings   DVT prophylaxis: On Coumadin Code Status: DNR Family Communication: Discussed with daughter [name] over the phone (she is an Therapist, sports) on 2/26 Disposition Plan: SNF tomorrow  Consultants:   None  Procedures:  None  Antimicrobials:  Ceftriaxone 2/24-2/25  Cefepime 2/25-2/26   Meropenem 2/26 >>   Subjective: -She is feeling better this morning, no headaches, no chest pain or shortness of breath.  Objective: Vitals:   05/01/16 0456 05/01/16 1358 05/01/16 2302 05/02/16 0536  BP: (!) 127/55 (!) 141/59 137/68 139/67  Pulse: 91 95 93 85  Resp: 18 18 18 18   Temp: 98.1 F (36.7 C) 97.7 F (36.5 C) 98 F (36.7 C) 97.5 F (36.4 C)  TempSrc: Oral Oral Oral   SpO2: 93% 91% 95% 97%  Weight:        Intake/Output Summary (Last 24 hours) at 05/02/16 1109 Last data filed at 05/02/16 0751  Gross per 24 hour  Intake              570 ml  Output              450 ml  Net              120 ml   Filed Weights   04/29/16 0638  Weight: 91.2 kg (201 lb)    Examination:  Constitutional: NAD Vitals:   05/01/16 0456 05/01/16 1358 05/01/16 2302 05/02/16 0536  BP: (!) 127/55 (!) 141/59 137/68 139/67  Pulse: 91 95 93 85  Resp: 18 18 18 18   Temp: 98.1 F (36.7 C) 97.7 F (36.5 C) 98 F (36.7 C) 97.5 F (36.4 C)  TempSrc: Oral Oral Oral   SpO2: 93% 91% 95% 97%  Weight:       Respiratory: clear to auscultation bilaterally, no wheezing, no crackles.  Cardiovascular: Regular rate and rhythm, no murmurs / rubs / gallops. No LE edema.  Abdomen: no tenderness. Bowel sounds positive.    Musculoskeletal: no clubbing / cyanosis.  Left knee swelling with overlying ecchymoses, ecchymosis on the medial aspect of the left thigh as well Psychiatric:  Alert and oriented x 2-3. Normal mood.   Data Reviewed: I have personally reviewed following labs and imaging studies  CBC:  Recent Labs Lab 04/29/16 0416 04/30/16 0634 05/01/16 0539 05/02/16 0706  WBC 17.1* 27.3* 18.5* 9.8  NEUTROABS 15.5* 24.0* 16.1*  --   HGB 12.5 11.8* 11.7* 12.1  HCT 38.7 36.9 35.7* 37.2  MCV 87.4 87.4 86.9 86.3  PLT 259 178 163 0000000   Basic Metabolic Panel:  Recent Labs Lab 04/29/16 0416 04/29/16 0702 04/30/16 0634 05/02/16 0706  NA 141  --  138 137  K 3.9  --  3.7 4.2  CL 103  --  106 107  CO2 26  --  24 24  GLUCOSE 164*  --  110* 95  BUN 15  --  21* 18  CREATININE 0.70  --  0.89 0.77  CALCIUM 9.6  --  8.8* 9.0  MG  --  1.6*  --   --   PHOS  --  3.2  --   --    GFR: Estimated Creatinine Clearance: 72 mL/min (by C-G formula based on SCr of 0.77 mg/dL). Liver Function Tests:  Recent Labs Lab 04/29/16 0702  AST 29  ALT 13*  ALKPHOS 112  BILITOT 1.5*  PROT 5.7*  ALBUMIN 2.9*   No results for input(s): LIPASE, AMYLASE in the last 168 hours. No results for input(s): AMMONIA in the last 168 hours. Coagulation Profile:  Recent Labs Lab 04/29/16 0416 04/30/16 0634 05/01/16 0539 05/02/16 0706  INR 2.36 2.22 2.23 2.31   Cardiac Enzymes:  Recent Labs Lab 04/29/16 0416  TROPONINI <0.03   BNP (last 3 results) No results for input(s): PROBNP in the last 8760 hours. HbA1C: No results for input(s): HGBA1C in the last 72 hours. CBG:  Recent Labs Lab 05/02/16 0743  GLUCAP 92   Lipid Profile: No results for input(s): CHOL, HDL, LDLCALC, TRIG, CHOLHDL, LDLDIRECT in the last 72 hours. Thyroid Function Tests: No results for input(s): TSH, T4TOTAL, FREET4, T3FREE, THYROIDAB in the last 72 hours. Anemia Panel: No results for input(s): VITAMINB12, FOLATE, FERRITIN, TIBC,  IRON, RETICCTPCT in the last 72 hours. Urine analysis:    Component Value Date/Time   COLORURINE AMBER (A) 04/29/2016 0336   APPEARANCEUR CLEAR 04/29/2016 0336   LABSPEC 1.008 04/29/2016 0336   PHURINE 6.0 04/29/2016 0336   GLUCOSEU NEGATIVE 04/29/2016 0336   HGBUR LARGE (A) 04/29/2016 0336   BILIRUBINUR NEGATIVE 04/29/2016 0336   BILIRUBINUR negative 04/04/2016 1629   KETONESUR NEGATIVE 04/29/2016 0336   PROTEINUR NEGATIVE 04/29/2016 0336   UROBILINOGEN 0.2 04/04/2016 1629   UROBILINOGEN 0.2 09/07/2012 1049   NITRITE POSITIVE (A) 04/29/2016 0336   LEUKOCYTESUR MODERATE (A) 04/29/2016 0336   Sepsis Labs: Invalid input(s):  PROCALCITONIN, LACTICIDVEN  Recent Results (from the past 240 hour(s))  Urine culture     Status: Abnormal   Collection Time: 04/29/16  5:47 AM  Result Value Ref Range Status   Specimen Description URINE, CLEAN CATCH  Final   Special Requests NONE  Final   Culture (A)  Final    >=100,000 COLONIES/mL ESCHERICHIA COLI Confirmed Extended Spectrum Beta-Lactamase Producer (ESBL)    Report Status 05/01/2016 FINAL  Final   Organism ID, Bacteria ESCHERICHIA COLI (A)  Final      Susceptibility   Escherichia coli - MIC*    AMPICILLIN >=32 RESISTANT Resistant     CEFAZOLIN >=64 RESISTANT Resistant     CEFTRIAXONE >=64 RESISTANT Resistant     CIPROFLOXACIN >=4 RESISTANT Resistant     GENTAMICIN <=1 SENSITIVE Sensitive     IMIPENEM <=0.25 SENSITIVE Sensitive     NITROFURANTOIN <=16 SENSITIVE Sensitive     TRIMETH/SULFA >=320 RESISTANT Resistant     AMPICILLIN/SULBACTAM >=32 RESISTANT Resistant     PIP/TAZO <=4 SENSITIVE Sensitive     Extended ESBL POSITIVE Resistant     * >=100,000 COLONIES/mL ESCHERICHIA COLI  MRSA PCR Screening     Status: None   Collection Time: 04/29/16  6:26 AM  Result Value Ref Range Status   MRSA by PCR NEGATIVE NEGATIVE Final    Comment:        The GeneXpert MRSA Assay (FDA approved for NASAL specimens only), is one component of  a comprehensive MRSA colonization surveillance program. It is not intended to diagnose MRSA infection nor to guide or monitor treatment for MRSA infections.       Radiology Studies: No results found.   Scheduled Meds: . atorvastatin  10 mg Oral q1800  . azelastine  1 spray Each Nare BID  . carbidopa-levodopa  1 tablet Oral TID  . celecoxib  100 mg Oral BID  . DULoxetine  60 mg Oral Daily  . fluticasone  1 spray Each Nare Daily  . irbesartan  150 mg Oral Daily  . levothyroxine  150 mcg Oral QAC breakfast  . loratadine  10 mg Oral Daily  . magnesium oxide  200 mg Oral Daily  . meropenem (MERREM) IV  1 g Intravenous Q8H  . nicotine  14 mg Transdermal Daily  . pantoprazole  40 mg Oral Daily  . potassium chloride SA  20 mEq Oral Daily  . sodium chloride flush  3 mL Intravenous Q12H  . warfarin  2 mg Oral Q Sat-1800  . warfarin  4 mg Oral Once per day on Sun Mon Tue Wed Thu Fri  . Warfarin - Pharmacist Dosing Inpatient   Does not apply q1800   Continuous Infusions:   Marzetta Board, MD, PhD [facility] Pager [phone]  If 7PM-7AM, please contact night-coverage [email or web address] Password [facility] 05/02/2016, 11:09 AM

## 2016-05-02 NOTE — Progress Notes (Signed)
[place] for Coumadin Indication: h/o PE  Allergies  Allergen Reactions  . Crestor [Rosuvastatin Calcium] Other (See Comments)    Feeling very bad, aching all over.  . Erythromycin Hives  . Gabapentin Other (See Comments)    Blurred vision  . Pregabalin Other (See Comments)    Crossed vision.  . Carbidopa Other (See Comments)    Headaches, nausea, dizziness  . Macrobid [Nitrofurantoin] Hives  . Losartan Other (See Comments)    Patient Measurements: Weight: 201 lb (91.2 kg)  Vital Signs: Temp: 97.5 F (36.4 C) (02/27 0536) Temp Source: Oral (02/26 2302) BP: 139/67 (02/27 0536) Pulse Rate: 85 (02/27 0536)  Labs:  Recent Labs  04/30/16 0634 05/01/16 0539 05/02/16 0706  HGB 11.8* 11.7* 12.1  HCT 36.9 35.7* 37.2  PLT 178 163 160  LABPROT 25.0* 25.1* 25.8*  INR 2.22 2.23 2.31  CREATININE 0.89  --  0.77    Estimated Creatinine Clearance: 72 mL/min (by C-G formula based on SCr of 0.77 mg/dL).  Assessment: 71yo female on Coumadin PTA for history of PE.  INR remains therapeutic today at 2.31. Hgb and plts are stable- no bleeding noted.  Goal of Therapy:  INR 2-3   Plan:  -Continue home Coumadin dose of 4mg  daily except 2 mg on Saturday -INR check on MWF while hospitalize -Follow for s/s bleeding  Pernie Grosso D. Makailey Hodgkin, PharmD, BCPS Clinical Pharmacist Pager: [phone] 05/02/2016 10:30 AM

## 2016-05-02 NOTE — Progress Notes (Signed)
I paged on call physician to put in an order to replace picc line rather than an order to exchange picc line, she called me back that it means the same I have  called the iv team to make them aware,  pt iv abx scheduled at 0600 has been move to 0800 since pt doesn't have any iv accesses

## 2016-05-03 DIAGNOSIS — E785 Hyperlipidemia, unspecified: Secondary | ICD-10-CM | POA: Diagnosis not present

## 2016-05-03 DIAGNOSIS — G20 Parkinson's disease: Secondary | ICD-10-CM | POA: Diagnosis not present

## 2016-05-03 DIAGNOSIS — N390 Urinary tract infection, site not specified: Secondary | ICD-10-CM

## 2016-05-03 DIAGNOSIS — I2699 Other pulmonary embolism without acute cor pulmonale: Secondary | ICD-10-CM | POA: Diagnosis not present

## 2016-05-03 DIAGNOSIS — Z96659 Presence of unspecified artificial knee joint: Secondary | ICD-10-CM | POA: Diagnosis not present

## 2016-05-03 DIAGNOSIS — R41841 Cognitive communication deficit: Secondary | ICD-10-CM | POA: Diagnosis not present

## 2016-05-03 DIAGNOSIS — E039 Hypothyroidism, unspecified: Secondary | ICD-10-CM

## 2016-05-03 DIAGNOSIS — F339 Major depressive disorder, recurrent, unspecified: Secondary | ICD-10-CM | POA: Diagnosis not present

## 2016-05-03 DIAGNOSIS — M549 Dorsalgia, unspecified: Secondary | ICD-10-CM | POA: Diagnosis not present

## 2016-05-03 DIAGNOSIS — R278 Other lack of coordination: Secondary | ICD-10-CM | POA: Diagnosis not present

## 2016-05-03 DIAGNOSIS — Z86711 Personal history of pulmonary embolism: Secondary | ICD-10-CM | POA: Diagnosis not present

## 2016-05-03 DIAGNOSIS — R531 Weakness: Secondary | ICD-10-CM | POA: Diagnosis not present

## 2016-05-03 DIAGNOSIS — I10 Essential (primary) hypertension: Secondary | ICD-10-CM

## 2016-05-03 DIAGNOSIS — Z5181 Encounter for therapeutic drug level monitoring: Secondary | ICD-10-CM | POA: Diagnosis not present

## 2016-05-03 DIAGNOSIS — A419 Sepsis, unspecified organism: Secondary | ICD-10-CM | POA: Diagnosis not present

## 2016-05-03 DIAGNOSIS — R2689 Other abnormalities of gait and mobility: Secondary | ICD-10-CM | POA: Diagnosis not present

## 2016-05-03 LAB — PROTIME-INR
INR: 2.4
Prothrombin Time: 26.6 seconds — ABNORMAL HIGH (ref 11.4–15.2)

## 2016-05-03 MED ORDER — OXYCODONE HCL 10 MG PO TABS: ORAL_TABLET | ORAL | 0 refills | Status: DC

## 2016-05-03 MED ORDER — SODIUM CHLORIDE 0.9 % IV SOLN: 1.0000 g | Freq: Three times a day (TID) | INTRAVENOUS | Status: DC

## 2016-05-03 MED ORDER — HEPARIN SOD (PORK) LOCK FLUSH 100 UNIT/ML IV SOLN
250.0000 [IU] | INTRAVENOUS | Status: AC | PRN
  Administered 2016-05-03: 250 [IU]

## 2016-05-03 MED ORDER — ZOLPIDEM TARTRATE 5 MG PO TABS: 5.0000 mg | ORAL_TABLET | Freq: Every day | ORAL | 0 refills | Status: DC

## 2016-05-03 NOTE — Progress Notes (Signed)
[place] for Coumadin Indication: h/o PE  Allergies  Allergen Reactions  . Crestor [Rosuvastatin Calcium] Other (See Comments)    Feeling very bad, aching all over.  . Erythromycin Hives  . Gabapentin Other (See Comments)    Blurred vision  . Pregabalin Other (See Comments)    Crossed vision.  . Carbidopa Other (See Comments)    Headaches, nausea, dizziness  . Macrobid [Nitrofurantoin] Hives  . Losartan Other (See Comments)    Patient Measurements: Weight: 201 lb (91.2 kg)  Vital Signs: Temp: 97.8 F (36.6 C) (02/28 0612) Temp Source: Oral (02/28 0612) BP: 145/73 (02/28 0612) Pulse Rate: 87 (02/28 0612)  Labs:  Recent Labs  05/01/16 0539 05/02/16 0706 05/03/16 0408  HGB 11.7* 12.1  --   HCT 35.7* 37.2  --   PLT 163 160  --   LABPROT 25.1* 25.8* 26.6*  INR 2.23 2.31 2.40  CREATININE  --  0.77  --     Estimated Creatinine Clearance: 72 mL/min (by C-G formula based on SCr of 0.77 mg/dL).  Assessment: 71yo female on Coumadin PTA for history of PE.  INR remains therapeutic today at 2.4. CBC stable during admission, no bleeding noted.  Goal of Therapy:  INR 2-3   Plan:  -Continue home Coumadin dose of 4mg  daily except 2 mg on Saturday -INR check on MWF while hospitalized -Follow for s/s bleeding  Marybelle Giraldo D. Babak Lucus, PharmD, BCPS Clinical Pharmacist Pager: [phone] 05/03/2016 10:45 AM

## 2016-05-03 NOTE — Progress Notes (Signed)
called multiple time to give report but unable to get a nurse online.  I left my number with sheila that work at [facility] so the nurse can call back

## 2016-05-03 NOTE — Discharge Summary (Signed)
PATIENT DETAILS Name: [name] [name] Age: 71 y.o. Sex: female Date of Birth: [birth year] MRN: [id number]. Admitting Physician: Reubin Milan, MD [id number], MD  Admit Date: 04/29/2016 Discharge date: 05/03/2016  Recommendations for Outpatient Follow-up:  1. Follow up with PCP in 1-2 weeks 2. Please obtain BMP/CBC in one week 3. Continue meropenem for 10 days from 2/26 4. Please discontinue PICC line once patient completes meropenem treatment. 5. Please check PT/INR in the next few days-on chronic Coumadin therapy.  Admitted From:  Home  Disposition: SNF   Home Health: No  Equipment/Devices: PICC line--2/26  Discharge Condition: Stable  CODE STATUS: FULL CODE  Diet recommendation:  Heart Healthy  Brief Summary: See H&P, Labs, Consult and Test reports for all details in brief, 71 year old female with history of hypertension, hyperlipidemia, Graves' disease, hypothyroidism, Parkinson's disease, peripheral vascular disease, PE 2 on chronic anticoagulation, currently residing in assisted living, who presented with weakness dizziness for the past few days as well as a fall the day prior to admission. She was found to have UTI secondary to ESBL Escherichia coli. See below for further details   Brief Hospital Course: Sepsis due to ESBL Escherichia coli UTI: Sepsis pathophysiology has resolved, she appears significantly improved. She no longer has leukocytosis. Clinically she has a nontoxic appearance. Initially she was on broad-spectrum antibiotic with cefepime, once the urine culture showed ESBL Escherichia coli-she was transitioned to meropenem. PICC line has been placed. Prior M.D. discussed with infectious disease, recommendations for a total of 10 days of therapy from 2/26. Please repeat CBC in 1 week, and removed PICC line once patient completes antimicrobial therapy.  Acute metabolic encephalopathy: Resolved. Likely secondary to above, probably has mild  dementia/cognitive dysfunction. She is completely awake and alert this morning.  Parkinson's disease:Continue her home carbidopa levodopa  Hypertension:Continue Avapro and HCTZ.  Hyperthyroidism/Graves' disease >>now hypothyroidism:Continue Synthroid  History of [id number] Coumadin and discharge, continue Coumadin on discharge, please continue to check INR periodically.   Hyperlipidemia:Continue Lipitor  Status post fall:left knee with significant bruising and swelling, physical therapy evaluated patient and recommended SNF, patient agreeable.Note Hip x-ray was negative for fractures.CT scan of the head as well as C-spine without any acute findings  Procedures/Studies: None  Discharge Diagnoses:  Principal Problem:   UTI (urinary tract infection) Active Problems:   S/P right TKA   History of pulmonary embolism   Fibromyalgia syndrome   Parkinson's disease (HCC)   Chronic low back pain   Benign essential HTN   SIRS (systemic inflammatory response syndrome) (HCC)   Hyperlipidemia   Hypothyroidism   Discharge Instructions:  Activity:  As tolerated with Full fall precautions use walker/cane & assistance as needed  Discharge Instructions    Call MD for:  difficulty breathing, headache or visual disturbances    Complete by:  As directed    Call MD for:  persistant nausea and vomiting    Complete by:  As directed    Call MD for:  redness, tenderness, or signs of infection (pain, swelling, redness, odor or green/yellow discharge around incision site)    Complete by:  As directed    Diet - low sodium heart healthy    Complete by:  As directed    Increase activity slowly    Complete by:  As directed      Allergies as of 05/03/2016      Reactions   Crestor [rosuvastatin Calcium] Other (See Comments)   Feeling very bad, aching all over.   Erythromycin Hives  Gabapentin Other (See Comments)   Blurred vision   Pregabalin Other (See Comments)   Crossed vision.    Carbidopa Other (See Comments)   Headaches, nausea, dizziness   Macrobid [nitrofurantoin] Hives   Losartan Other (See Comments)      Medication List    STOP taking these medications   aspirin 81 MG tablet   HYDROcodone-homatropine 5-1.5 MG/5ML syrup Commonly known as:  HYCODAN     TAKE these medications   acetaminophen 500 MG tablet Commonly known as:  TYLENOL Take 500 mg by mouth every 6 (six) hours as needed for mild pain.   albuterol 108 (90 Base) MCG/ACT inhaler Commonly known as:  PROVENTIL HFA;VENTOLIN HFA Inhale 2 puffs into the lungs every 6 (six) hours as needed for wheezing or shortness of breath.   atorvastatin 10 MG tablet Commonly known as:  LIPITOR Take 1 tablet (10 mg total) by mouth daily.   azelastine 0.1 % nasal spray Commonly known as:  ASTELIN Place 1 spray into both nostrils 2 (two) times daily. Use in each nostril as directed   BIOFREEZE EX Apply 1 application topically 4 (four) times daily. Apply to affected area.   Carbidopa-Levodopa ER 23.75-95 MG Cpcr Commonly known as:  RYTARY Take 1 tablet by mouth 3 (three) times daily.   celecoxib 100 MG capsule Commonly known as:  CELEBREX Take 1 capsule (100 mg total) by mouth 2 (two) times daily.   cetirizine 10 MG tablet Commonly known as:  ZYRTEC Take 1 tablet (10 mg total) by mouth daily.   cyanocobalamin 500 MCG tablet Take 500 mcg by mouth daily.   DULoxetine 60 MG capsule Commonly known as:  CYMBALTA Take 1 capsule (60 mg total) by mouth daily.   fluticasone 50 MCG/ACT nasal spray Commonly known as:  FLONASE Place 1 spray into both nostrils daily.   magnesium oxide 400 (241.3 Mg) MG tablet Commonly known as:  MAG-OX Take 0.5 tablets (200 mg total) by mouth daily.   meropenem 1 g in sodium chloride 0.9 % 100 mL Inject 1 g into the vein every 8 (eight) hours. 10 days from 2/26 and then stop   methocarbamol 500 MG tablet Commonly known as:  ROBAXIN Take 500 mg by mouth every 6  (six) hours as needed for muscle spasms.   omega-3 acid ethyl esters 1 g capsule Commonly known as:  LOVAZA Take 1 g by mouth daily.   Oxycodone HCl 10 MG Tabs Take 1/2 tablet daily as needed for neck pain. If pain persists may take another 1/2 tablet per day   potassium chloride SA 20 MEQ tablet Commonly known as:  K-DUR,KLOR-CON Take 1 tablet (20 mEq total) by mouth daily.   SYNTHROID 150 MCG tablet Generic drug:  levothyroxine Take 1 tablet (150 mcg total) by mouth daily before breakfast.   telmisartan-hydrochlorothiazide 40-12.5 MG tablet Commonly known as:  MICARDIS HCT Take 1 tablet by mouth daily.   Vitamin D3 5000 units Caps Take 5,000 Units by mouth daily.   warfarin 4 MG tablet Commonly known as:  COUMADIN Take 4 mg by mouth daily. 4 mg every day except on Saturday. Saturday, patient takes 2 mg. What changed:  Another medication with the same name was removed. Continue taking this medication, and follow the directions you see here.   warfarin 2 MG tablet Commonly known as:  COUMADIN Take 2 mg by mouth See admin instructions. Take 2 mg only on  Saturday. What changed:  Another medication with the same name  was removed. Continue taking this medication, and follow the directions you see here.   zolpidem 5 MG tablet Commonly known as:  AMBIEN Take 1 tablet (5 mg total) by mouth at bedtime. Use ONLY WHEN NEEDED for sleep. Does not have to be used daily       Contact information for follow-up providers    [name],[name], MD. Schedule an appointment as soon as possible for a visit in 1 week(s).   Specialty:  Family Medicine Contact information: [place] [phone] [phone]            Contact information for after-discharge care    Destination    [facility] Follow up.   Specialty:  [place] information: [facility] [phone]                  Allergies  Allergen Reactions  . Crestor [Rosuvastatin Calcium] Other (See Comments)    Feeling very bad, aching all over.  . Erythromycin Hives  . Gabapentin Other (See Comments)    Blurred vision  . Pregabalin Other (See Comments)    Crossed vision.  . Carbidopa Other (See Comments)    Headaches, nausea, dizziness  . Macrobid [Nitrofurantoin] Hives  . Losartan Other (See Comments)   Consultations:   None  Other Procedures/Studies: Dg Chest 1 View  Result Date: 04/29/2016 CLINICAL DATA:  71 year old female with fall. EXAM: CHEST 1 VIEW COMPARISON:  Chest radiograph dated 12/17/2015 FINDINGS: The heart size and mediastinal contours are within normal limits. Both lungs are clear. The visualized skeletal structures are unremarkable. IMPRESSION: No active disease. Electronically Signed   By: Anner Crete M.D.   On: 04/29/2016 04:45   Ct Head Wo Contrast  Result Date: 04/29/2016 CLINICAL DATA:  71 year old female with fall and head injury. EXAM: CT HEAD WITHOUT CONTRAST CT CERVICAL SPINE WITHOUT CONTRAST TECHNIQUE: Multidetector CT imaging of the head and cervical spine was performed following the standard protocol without intravenous contrast. Multiplanar CT image reconstructions of the cervical spine were also generated. COMPARISON:  Head CT dated 12/20/2015 FINDINGS: CT HEAD FINDINGS Brain: There is stable moderate age-related atrophy and chronic microvascular ischemic changes. There is dilatation of the ventricles out of proportion with the sulci which may represent central volume loss versus normal pressure hydrocephalus. Clinical correlation is recommended. Incidentally note of a cavum septum pellucidum and cavum vergae. There is no acute intracranial hemorrhage. No mass effect or midline shift noted. No extra-axial fluid collection. Vascular: No hyperdense vessel or unexpected calcification. Skull: Normal. Negative for fracture or focal lesion. Sinuses/Orbits: No  acute finding. Other: None CT CERVICAL SPINE FINDINGS Alignment: No acute subluxation. Skull base and vertebrae: There is no acute fracture. The bones are osteopenic. Increased in the degree of C5 chronic compression deformity with anterior wedging compared to the prior CT. No retropulsed fragment. There is ankylosis of the left C2-C5 articular facets. Soft tissues and spinal canal: No prevertebral fluid or swelling. No visible canal hematoma. Disc levels: Mild multilevel disc disease. Disc space narrowing most prominent at C6-7. Upper chest: Thyroidectomy changes. No adenopathy. The visualized portions of the upper lungs are clear. Atherosclerotic calcification of the aortic arch. Other: None IMPRESSION: 1. No acute intracranial hemorrhage. 2. Age-related atrophy and chronic microvascular ischemic changes. 3. No acute/traumatic cervical spine pathology. Electronically Signed   By: Anner Crete M.D.   On: 04/29/2016 04:27   Ct Cervical Spine Wo Contrast  Result Date: 04/29/2016 CLINICAL DATA:  71 year old female with fall and head injury. EXAM: CT HEAD WITHOUT CONTRAST CT CERVICAL SPINE WITHOUT CONTRAST TECHNIQUE: Multidetector CT imaging of the head and cervical spine was performed following the standard protocol without intravenous contrast. Multiplanar CT image reconstructions of the cervical spine were also generated. COMPARISON:  Head CT dated 12/20/2015 FINDINGS: CT HEAD FINDINGS Brain: There is stable moderate age-related atrophy and chronic microvascular ischemic changes. There is dilatation of the ventricles out of proportion with the sulci which may represent central volume loss versus normal pressure hydrocephalus. Clinical correlation is recommended. Incidentally note of a cavum septum pellucidum and cavum vergae. There is no acute intracranial hemorrhage. No mass effect or midline shift noted. No extra-axial fluid collection. Vascular: No hyperdense vessel or unexpected calcification. Skull:  Normal. Negative for fracture or focal lesion. Sinuses/Orbits: No acute finding. Other: None CT CERVICAL SPINE FINDINGS Alignment: No acute subluxation. Skull base and vertebrae: There is no acute fracture. The bones are osteopenic. Increased in the degree of C5 chronic compression deformity with anterior wedging compared to the prior CT. No retropulsed fragment. There is ankylosis of the left C2-C5 articular facets. Soft tissues and spinal canal: No prevertebral fluid or swelling. No visible canal hematoma. Disc levels: Mild multilevel disc disease. Disc space narrowing most prominent at C6-7. Upper chest: Thyroidectomy changes. No adenopathy. The visualized portions of the upper lungs are clear. Atherosclerotic calcification of the aortic arch. Other: None IMPRESSION: 1. No acute intracranial hemorrhage. 2. Age-related atrophy and chronic microvascular ischemic changes. 3. No acute/traumatic cervical spine pathology. Electronically Signed   By: Anner Crete M.D.   On: 04/29/2016 04:27   Dg Chest Port 1 View  Result Date: 04/29/2016 CLINICAL DATA:  Sepsis EXAM: PORTABLE CHEST 1 VIEW COMPARISON:  04/29/2016 and prior radiographs FINDINGS: The cardiomediastinal silhouette is unremarkable. There is no evidence of focal airspace disease, pulmonary edema, suspicious pulmonary nodule/mass, pleural effusion, or pneumothorax. No acute bony abnormalities are identified. IMPRESSION: No active disease. Electronically Signed   By: Margarette Canada M.D.   On: 04/29/2016 07:49   Dg Knee Complete 4 Views Left  Result Date: 04/29/2016 CLINICAL DATA:  71 year old female with fall and left knee pain. EXAM: LEFT KNEE - COMPLETE 4+ VIEW COMPARISON:  Left knee MRI dated 01/19/2007 FINDINGS: There is a total left knee arthroplasty. The arthroplasty components appear intact and in anatomic alignment. No evidence of hardware loosening. There is no acute fracture or dislocation. The bones are osteopenic. No significant joint  effusion. There is subcutaneous soft tissue edema anterior to the knee and anterior to the patellar tendon. IMPRESSION: 1. No acute fracture or dislocation. 2. Total left knee arthroplasty appears intact. 3. Anterior knee subcutaneous edema. Clinical correlation is recommended. Electronically Signed   By: Anner Crete M.D.   On: 04/29/2016 04:42   Dg Hip Unilat W Or Wo Pelvis 2-3 Views Left  Result Date: 04/29/2016 CLINICAL DATA:  71 year old female with fall and left hip pain. EXAM: DG HIP (WITH OR WITHOUT PELVIS) 2-3V LEFT COMPARISON:  None. FINDINGS: There is no acute fracture or dislocation. The bones are osteopenic. Mild osteoarthritic changes of the hips. The soft tissues appear unremarkable. IMPRESSION: No acute fracture or dislocation. Electronically Signed   By: Anner Crete M.D.   On: 04/29/2016 04:44      TODAY-DAY OF DISCHARGE:  Subjective:   [name] [name] today has no headache,no chest abdominal pain,no new weakness tingling or numbness, feels much better wants to go home  today.   Objective:   Blood pressure (!) 145/73, pulse 87, temperature 97.8 F (36.6 C), temperature source Oral, resp. rate 18, weight 91.2 kg (201 lb), SpO2 93 %.  Intake/Output Summary (Last 24 hours) at 05/03/16 1123 Last data filed at 05/03/16 0515  Gross per 24 hour  Intake              490 ml  Output                0 ml  Net              490 ml   Filed Weights   04/29/16 0638  Weight: 91.2 kg (201 lb)    Exam: Awake Alert, Oriented *3, No new F.N deficits, Normal affect [place].AT,PERRAL Supple Neck,No JVD, No cervical lymphadenopathy appriciated.  Symmetrical Chest wall movement, Good air movement bilaterally, CTAB RRR,No Gallops,Rubs or new Murmurs, No Parasternal Heave +ve B.Sounds, Abd Soft, Non tender, No organomegaly appriciated, No rebound -guarding or rigidity. No Cyanosis, Clubbing or edema, No new Rash or bruise   PERTINENT RADIOLOGIC STUDIES: Dg Chest 1 View  Result  Date: 04/29/2016 CLINICAL DATA:  71 year old female with fall. EXAM: CHEST 1 VIEW COMPARISON:  Chest radiograph dated 12/17/2015 FINDINGS: The heart size and mediastinal contours are within normal limits. Both lungs are clear. The visualized skeletal structures are unremarkable. IMPRESSION: No active disease. Electronically Signed   By: Anner Crete M.D.   On: 04/29/2016 04:45   Ct Head Wo Contrast  Result Date: 04/29/2016 CLINICAL DATA:  71 year old female with fall and head injury. EXAM: CT HEAD WITHOUT CONTRAST CT CERVICAL SPINE WITHOUT CONTRAST TECHNIQUE: Multidetector CT imaging of the head and cervical spine was performed following the standard protocol without intravenous contrast. Multiplanar CT image reconstructions of the cervical spine were also generated. COMPARISON:  Head CT dated 12/20/2015 FINDINGS: CT HEAD FINDINGS Brain: There is stable moderate age-related atrophy and chronic microvascular ischemic changes. There is dilatation of the ventricles out of proportion with the sulci which may represent central volume loss versus normal pressure hydrocephalus. Clinical correlation is recommended. Incidentally note of a cavum septum pellucidum and cavum vergae. There is no acute intracranial hemorrhage. No mass effect or midline shift noted. No extra-axial fluid collection. Vascular: No hyperdense vessel or unexpected calcification. Skull: Normal. Negative for fracture or focal lesion. Sinuses/Orbits: No acute finding. Other: None CT CERVICAL SPINE FINDINGS Alignment: No acute subluxation. Skull base and vertebrae: There is no acute fracture. The bones are osteopenic. Increased in the degree of C5 chronic compression deformity with anterior wedging compared to the prior CT. No retropulsed fragment. There is ankylosis of the left C2-C5 articular facets. Soft tissues and spinal canal: No prevertebral fluid or swelling. No visible canal hematoma. Disc levels: Mild multilevel disc disease. Disc space  narrowing most prominent at C6-7. Upper chest: Thyroidectomy changes. No adenopathy. The visualized portions of the upper lungs are clear. Atherosclerotic calcification of the aortic arch. Other: None IMPRESSION: 1. No acute intracranial hemorrhage. 2. Age-related atrophy and chronic microvascular ischemic changes. 3. No acute/traumatic cervical spine pathology. Electronically Signed   By: Anner Crete M.D.   On: 04/29/2016 04:27   Ct Cervical Spine Wo Contrast  Result Date: 04/29/2016 CLINICAL DATA:  71 year old female with fall and head injury. EXAM: CT HEAD WITHOUT CONTRAST CT CERVICAL SPINE WITHOUT CONTRAST TECHNIQUE: Multidetector CT imaging of the head and cervical spine was performed following the standard protocol without intravenous contrast. Multiplanar CT image reconstructions of the cervical  spine were also generated. COMPARISON:  Head CT dated 12/20/2015 FINDINGS: CT HEAD FINDINGS Brain: There is stable moderate age-related atrophy and chronic microvascular ischemic changes. There is dilatation of the ventricles out of proportion with the sulci which may represent central volume loss versus normal pressure hydrocephalus. Clinical correlation is recommended. Incidentally note of a cavum septum pellucidum and cavum vergae. There is no acute intracranial hemorrhage. No mass effect or midline shift noted. No extra-axial fluid collection. Vascular: No hyperdense vessel or unexpected calcification. Skull: Normal. Negative for fracture or focal lesion. Sinuses/Orbits: No acute finding. Other: None CT CERVICAL SPINE FINDINGS Alignment: No acute subluxation. Skull base and vertebrae: There is no acute fracture. The bones are osteopenic. Increased in the degree of C5 chronic compression deformity with anterior wedging compared to the prior CT. No retropulsed fragment. There is ankylosis of the left C2-C5 articular facets. Soft tissues and spinal canal: No prevertebral fluid or swelling. No visible canal  hematoma. Disc levels: Mild multilevel disc disease. Disc space narrowing most prominent at C6-7. Upper chest: Thyroidectomy changes. No adenopathy. The visualized portions of the upper lungs are clear. Atherosclerotic calcification of the aortic arch. Other: None IMPRESSION: 1. No acute intracranial hemorrhage. 2. Age-related atrophy and chronic microvascular ischemic changes. 3. No acute/traumatic cervical spine pathology. Electronically Signed   By: Anner Crete M.D.   On: 04/29/2016 04:27   Dg Chest Port 1 View  Result Date: 04/29/2016 CLINICAL DATA:  Sepsis EXAM: PORTABLE CHEST 1 VIEW COMPARISON:  04/29/2016 and prior radiographs FINDINGS: The cardiomediastinal silhouette is unremarkable. There is no evidence of focal airspace disease, pulmonary edema, suspicious pulmonary nodule/mass, pleural effusion, or pneumothorax. No acute bony abnormalities are identified. IMPRESSION: No active disease. Electronically Signed   By: Margarette Canada M.D.   On: 04/29/2016 07:49   Dg Knee Complete 4 Views Left  Result Date: 04/29/2016 CLINICAL DATA:  71 year old female with fall and left knee pain. EXAM: LEFT KNEE - COMPLETE 4+ VIEW COMPARISON:  Left knee MRI dated 01/19/2007 FINDINGS: There is a total left knee arthroplasty. The arthroplasty components appear intact and in anatomic alignment. No evidence of hardware loosening. There is no acute fracture or dislocation. The bones are osteopenic. No significant joint effusion. There is subcutaneous soft tissue edema anterior to the knee and anterior to the patellar tendon. IMPRESSION: 1. No acute fracture or dislocation. 2. Total left knee arthroplasty appears intact. 3. Anterior knee subcutaneous edema. Clinical correlation is recommended. Electronically Signed   By: Anner Crete M.D.   On: 04/29/2016 04:42   Dg Hip Unilat W Or Wo Pelvis 2-3 Views Left  Result Date: 04/29/2016 CLINICAL DATA:  71 year old female with fall and left hip pain. EXAM: DG HIP (WITH  OR WITHOUT PELVIS) 2-3V LEFT COMPARISON:  None. FINDINGS: There is no acute fracture or dislocation. The bones are osteopenic. Mild osteoarthritic changes of the hips. The soft tissues appear unremarkable. IMPRESSION: No acute fracture or dislocation. Electronically Signed   By: Anner Crete M.D.   On: 04/29/2016 04:44     PERTINENT LAB RESULTS: CBC:  Recent Labs  05/01/16 0539 05/02/16 0706  WBC 18.5* 9.8  HGB 11.7* 12.1  HCT 35.7* 37.2  PLT 163 160   CMET CMP     Component Value Date/Time   NA 137 05/02/2016 0706   NA 145 01/17/2016   K 4.2 05/02/2016 0706   CL 107 05/02/2016 0706   CO2 24 05/02/2016 0706   GLUCOSE 95 05/02/2016 0706   BUN 18  05/02/2016 0706   BUN 16 01/17/2016   CREATININE 0.77 05/02/2016 0706   CALCIUM 9.0 05/02/2016 0706   PROT 5.7 (L) 04/29/2016 0702   ALBUMIN 2.9 (L) 04/29/2016 0702   AST 29 04/29/2016 0702   ALT 13 (L) 04/29/2016 0702   ALKPHOS 112 04/29/2016 0702   BILITOT 1.5 (H) 04/29/2016 0702   GFRNONAA >60 05/02/2016 0706   GFRAA >60 05/02/2016 0706    GFR Estimated Creatinine Clearance: 72 mL/min (by C-G formula based on SCr of 0.77 mg/dL). No results for input(s): LIPASE, AMYLASE in the last 72 hours. No results for input(s): CKTOTAL, CKMB, CKMBINDEX, TROPONINI in the last 72 hours. Invalid input(s): POCBNP No results for input(s): DDIMER in the last 72 hours. No results for input(s): HGBA1C in the last 72 hours. No results for input(s): CHOL, HDL, LDLCALC, TRIG, CHOLHDL, LDLDIRECT in the last 72 hours.  Recent Labs  05/02/16 1027  TSH 1.071   No results for input(s): VITAMINB12, FOLATE, FERRITIN, TIBC, IRON, RETICCTPCT in the last 72 hours. Coags:  Recent Labs  05/02/16 0706 05/03/16 0408  INR 2.31 2.40   Microbiology: Recent Results (from the past 240 hour(s))  Urine culture     Status: Abnormal   Collection Time: 04/29/16  5:47 AM  Result Value Ref Range Status   Specimen Description URINE, CLEAN CATCH  Final     Special Requests NONE  Final   Culture (A)  Final    >=100,000 COLONIES/mL ESCHERICHIA COLI Confirmed Extended Spectrum Beta-Lactamase Producer (ESBL)    Report Status 05/01/2016 FINAL  Final   Organism ID, Bacteria ESCHERICHIA COLI (A)  Final      Susceptibility   Escherichia coli - MIC*    AMPICILLIN >=32 RESISTANT Resistant     CEFAZOLIN >=64 RESISTANT Resistant     CEFTRIAXONE >=64 RESISTANT Resistant     CIPROFLOXACIN >=4 RESISTANT Resistant     GENTAMICIN <=1 SENSITIVE Sensitive     IMIPENEM <=0.25 SENSITIVE Sensitive     NITROFURANTOIN <=16 SENSITIVE Sensitive     TRIMETH/SULFA >=320 RESISTANT Resistant     AMPICILLIN/SULBACTAM >=32 RESISTANT Resistant     PIP/TAZO <=4 SENSITIVE Sensitive     Extended ESBL POSITIVE Resistant     * >=100,000 COLONIES/mL ESCHERICHIA COLI  MRSA PCR Screening     Status: None   Collection Time: 04/29/16  6:26 AM  Result Value Ref Range Status   MRSA by PCR NEGATIVE NEGATIVE Final    Comment:        The GeneXpert MRSA Assay (FDA approved for NASAL specimens only), is one component of a comprehensive MRSA colonization surveillance program. It is not intended to diagnose MRSA infection nor to guide or monitor treatment for MRSA infections.     FURTHER DISCHARGE INSTRUCTIONS:  Get Medicines reviewed and adjusted: Please take all your medications with you for your next visit with your Primary MD  Laboratory/radiological data: Please request your Primary MD to go over all hospital tests and procedure/radiological results at the follow up, please ask your Primary MD to get all Hospital records sent to his/her office.  In some cases, they will be blood work, cultures and biopsy results pending at the time of your discharge. Please request that your primary care M.D. goes through all the records of your hospital data and follows up on these results.  Also Note the following: If you experience worsening of your admission symptoms, develop  shortness of breath, life threatening emergency, suicidal or homicidal thoughts you must seek  medical attention immediately by calling 911 or calling your MD immediately  if symptoms less severe.  You must read complete instructions/literature along with all the possible adverse reactions/side effects for all the Medicines you take and that have been prescribed to you. Take any new Medicines after you have completely understood and accpet all the possible adverse reactions/side effects.   Do not drive when taking Pain medications or sleeping medications (Benzodaizepines)  Do not take more than prescribed Pain, Sleep and Anxiety Medications. It is not advisable to combine anxiety,sleep and pain medications without talking with your primary care practitioner  Special Instructions: If you have smoked or chewed Tobacco  in the last 2 yrs please stop smoking, stop any regular Alcohol  and or any Recreational drug use.  Wear Seat belts while driving.  Please note: You were cared for by a hospitalist during your hospital stay. Once you are discharged, your primary care physician will handle any further medical issues. Please note that NO REFILLS for any discharge medications will be authorized once you are discharged, as it is imperative that you return to your primary care physician (or establish a relationship with a primary care physician if you do not have one) for your post hospital discharge needs so that they can reassess your need for medications and monitor your lab values.  Total Time spent coordinating discharge including counseling, education and face to face time equals 45 minutes.  SignedOren Binet 05/03/2016 11:23 AM

## 2016-05-03 NOTE — Progress Notes (Signed)
Pharmacy Antibiotic Note  [name] [name] is a 71 y.o. female admitted on 04/29/2016 with UTI.   Pharmacy has been consulted for meropenem dosing.  Urine culture with ESBL EColi. WBC now within normal range, and she remains afebrile.    Had PICC placed and plan is for 10 totals days of antibiotics.  Plan: Meropenem 1g IV q8h- could consider changing to ertapenem 1g IV q24h upon discharge in order to ease administration Follow renal function, changes to discharge plans   Weight: 201 lb (91.2 kg)  Temp (24hrs), Avg:98 F (36.7 C), Min:97.8 F (36.6 C), Max:98.3 F (36.8 C)   Recent Labs Lab 04/29/16 0416 04/30/16 0634 05/01/16 0539 05/02/16 0706  WBC 17.1* 27.3* 18.5* 9.8  CREATININE 0.70 0.89  --  0.77    Estimated Creatinine Clearance: 72 mL/min (by C-G formula based on SCr of 0.77 mg/dL).    Allergies  Allergen Reactions  . Crestor [Rosuvastatin Calcium] Other (See Comments)    Feeling very bad, aching all over.  . Erythromycin Hives  . Gabapentin Other (See Comments)    Blurred vision  . Pregabalin Other (See Comments)    Crossed vision.  . Carbidopa Other (See Comments)    Headaches, nausea, dizziness  . Macrobid [Nitrofurantoin] Hives  . Losartan Other (See Comments)    Antimicrobials this admission: Ceftriaxone 2/24>> 2/25 Cefepime 2/25>>2/26 Meropenem 2/26>>   Microbiology results: 2/24 UCx: >100K ESBL EColi 2/24 MRSA PCR: negative  Thank you for allowing pharmacy to be a part of this patient's care.  Alda Gaultney D. Karter Haire, PharmD, BCPS Clinical Pharmacist Pager: [phone] 05/03/2016 10:46 AM

## 2016-05-03 NOTE — Care Management Important Message (Signed)
Important Message  Patient Details  Name: [name] [name] MRN: [id number] Date of Birth: [birth year]   Medicare Important Message Given:  Yes    Sharin Mons, RN 05/03/2016, 8:28 AM

## 2016-05-03 NOTE — Clinical Social Work Placement (Signed)
   CLINICAL SOCIAL WORK PLACEMENT  NOTE  Date:  05/03/2016  Patient Details  Name: [name] [name] MRN: [id number] Date of Birth: [birth year]  Clinical Social Work is seeking post-discharge placement for this patient at the [place] level of care (*CSW will initial, date and re-position this form in  chart as items are completed):  Yes   Patient/family provided with [facility]'s list of facilities offering this level of care within the geographic area requested by the patient (or if unable, by the patient's family).  Yes   Patient/family informed of their freedom to choose among providers that offer the needed level of care, that participate in Medicare, Medicaid or managed care program needed by the patient, have an available bed and are willing to accept the patient.  Yes   Patient/family informed of [facility]'s ownership interest in [facility] and [facility], as well as of the fact that they are under no obligation to receive care at these facilities.  PASRR submitted to EDS on 05/01/16     PASRR number received on 05/01/16     Existing PASRR number confirmed on       FL2 transmitted to all facilities in geographic area requested by pt/family on 05/01/16     FL2 transmitted to all facilities within larger geographic area on       Patient informed that his/her managed care company has contracts with or will negotiate with certain facilities, including the following:        Yes   Patient/family informed of bed offers received.  Patient chooses bed at [facility]     Physician recommends and patient chooses bed at      Patient to be transferred to [facility] on 05/03/16.  Patient to be transferred to facility by Car     Patient family notified on 05/03/16 of transfer.  Name of family member notified:  [name], daughter     PHYSICIAN Please sign FL2, Please sign DNR, Please prepare  priority discharge summary, including medications     Additional Comment:    _______________________________________________ Benard Halsted, [place] 05/03/2016, 10:33 AM

## 2016-05-03 NOTE — Progress Notes (Signed)
patient is being discharged to [facility]. Discharge instructions were given to patient and family

## 2016-05-03 NOTE — Progress Notes (Signed)
Patient will DC to: [facility] Anticipated DC date: 05/03/16 Family notified: Daughter and Son Transport by: Son by car [place] Report #: [id number] Please send signed Oxy script with patient's family to facility.  Per MD patient ready for DC to [facility]. RN, patient, patient's family, and facility notified of DC. Discharge Summary sent to facility. RN given number for report. DC packet on chart. Ambulance transport requested for patient.   CSW signing off.  Cedric Fishman, [place] Social Worker [phone]

## 2016-05-04 DIAGNOSIS — N390 Urinary tract infection, site not specified: Secondary | ICD-10-CM | POA: Diagnosis not present

## 2016-05-04 DIAGNOSIS — G20 Parkinson's disease: Secondary | ICD-10-CM | POA: Diagnosis not present

## 2016-05-04 DIAGNOSIS — A419 Sepsis, unspecified organism: Secondary | ICD-10-CM | POA: Diagnosis not present

## 2016-05-04 DIAGNOSIS — R531 Weakness: Secondary | ICD-10-CM | POA: Diagnosis not present

## 2016-05-04 DIAGNOSIS — E039 Hypothyroidism, unspecified: Secondary | ICD-10-CM | POA: Diagnosis not present

## 2016-05-04 DIAGNOSIS — I2699 Other pulmonary embolism without acute cor pulmonale: Secondary | ICD-10-CM | POA: Diagnosis not present

## 2016-05-04 DIAGNOSIS — M549 Dorsalgia, unspecified: Secondary | ICD-10-CM | POA: Diagnosis not present

## 2016-05-04 DIAGNOSIS — I10 Essential (primary) hypertension: Secondary | ICD-10-CM | POA: Diagnosis not present

## 2016-05-11 DIAGNOSIS — I10 Essential (primary) hypertension: Secondary | ICD-10-CM | POA: Diagnosis not present

## 2016-05-11 DIAGNOSIS — G20 Parkinson's disease: Secondary | ICD-10-CM | POA: Diagnosis not present

## 2016-05-11 DIAGNOSIS — N390 Urinary tract infection, site not specified: Secondary | ICD-10-CM | POA: Diagnosis not present

## 2016-05-11 DIAGNOSIS — I2699 Other pulmonary embolism without acute cor pulmonale: Secondary | ICD-10-CM | POA: Diagnosis not present

## 2016-05-17 ENCOUNTER — Telehealth: Payer: Self-pay | Admitting: Family Medicine

## 2016-05-17 DIAGNOSIS — Z86711 Personal history of pulmonary embolism: Secondary | ICD-10-CM | POA: Diagnosis not present

## 2016-05-17 DIAGNOSIS — R41841 Cognitive communication deficit: Secondary | ICD-10-CM | POA: Diagnosis not present

## 2016-05-17 DIAGNOSIS — Z87440 Personal history of urinary (tract) infections: Secondary | ICD-10-CM | POA: Diagnosis not present

## 2016-05-17 DIAGNOSIS — F339 Major depressive disorder, recurrent, unspecified: Secondary | ICD-10-CM | POA: Diagnosis not present

## 2016-05-17 DIAGNOSIS — Z96659 Presence of unspecified artificial knee joint: Secondary | ICD-10-CM | POA: Diagnosis not present

## 2016-05-17 DIAGNOSIS — M797 Fibromyalgia: Secondary | ICD-10-CM | POA: Diagnosis not present

## 2016-05-17 DIAGNOSIS — G20 Parkinson's disease: Secondary | ICD-10-CM | POA: Diagnosis not present

## 2016-05-17 DIAGNOSIS — E039 Hypothyroidism, unspecified: Secondary | ICD-10-CM | POA: Diagnosis not present

## 2016-05-17 DIAGNOSIS — Z7901 Long term (current) use of anticoagulants: Secondary | ICD-10-CM | POA: Diagnosis not present

## 2016-05-17 DIAGNOSIS — I10 Essential (primary) hypertension: Secondary | ICD-10-CM | POA: Diagnosis not present

## 2016-05-17 DIAGNOSIS — I739 Peripheral vascular disease, unspecified: Secondary | ICD-10-CM | POA: Diagnosis not present

## 2016-05-17 DIAGNOSIS — Z9181 History of falling: Secondary | ICD-10-CM | POA: Diagnosis not present

## 2016-05-17 DIAGNOSIS — E785 Hyperlipidemia, unspecified: Secondary | ICD-10-CM | POA: Diagnosis not present

## 2016-05-17 NOTE — Telephone Encounter (Signed)
Called and [facility] for [name] [name]- this is fine with me

## 2016-05-17 NOTE — Telephone Encounter (Signed)
Caller name: Hilliard Clark  Relation to pt: Physical Therapist [facility] Call back number: [phone] Pharmacy:  Reason for call: Physical therapist was calling stating that he is needing a verbal approval of the pt to have a one time a wk for only one wk for physical therapy and then needs a two times a wk for 5 wks therapy for Balance training- gate training - [facility]) Please advise.

## 2016-05-22 DIAGNOSIS — I739 Peripheral vascular disease, unspecified: Secondary | ICD-10-CM | POA: Diagnosis not present

## 2016-05-22 DIAGNOSIS — R41841 Cognitive communication deficit: Secondary | ICD-10-CM | POA: Diagnosis not present

## 2016-05-22 DIAGNOSIS — I10 Essential (primary) hypertension: Secondary | ICD-10-CM | POA: Diagnosis not present

## 2016-05-22 DIAGNOSIS — F339 Major depressive disorder, recurrent, unspecified: Secondary | ICD-10-CM | POA: Diagnosis not present

## 2016-05-22 DIAGNOSIS — M797 Fibromyalgia: Secondary | ICD-10-CM | POA: Diagnosis not present

## 2016-05-22 DIAGNOSIS — G20 Parkinson's disease: Secondary | ICD-10-CM | POA: Diagnosis not present

## 2016-05-23 NOTE — Progress Notes (Deleted)
[name] [name] was seen today in the movement disorders clinic for neurologic consultation at the request of [name],[name], MD.   This patient is accompanied in the office by her child who supplements the history.   Prior records made available to me were reviewed.  The patient first saw Dr. [name] in March, 2016 at which point she was complaining about symptoms of diplopia and weakness.  Because of the diplopia she had acetylcholine receptor antibodies that were performed that were negative.  She has had an MRI and MRA of the brain with and without gadolinium.  I reviewed these images.  These were done on 05/21/2014.  This just revealed mild small vessel disease.  She also had a carotid ultrasound with him in March, 2016 revealed less than 50% stenosis bilaterally.  She followed up with him on 07/23/2014 and started on carbidopa/levodopa 25/100, one tablet 3 times per day (8am, 12pm, 5pm, before the meals).  She called back to him and complained about diffuse psain, headaches, depression and nausea.  Although he was not completely convinced that some of these things were not associated with her fibromyalgia and situational depression (her grandson was leaving for college and he serves as a caregiver), it was decided to taper off of her levodopa on 09/16/2014.   She did this and is now off the medication.  She states today that she initially felt great on the medication but then felt that it caused side effects.  Now that she is off of it, however, she c/o difficulty with walking. They also discussed referrals to pain management and psychiatry.  She presents today for a second opinion.  Pt states that diplopia was her first sx and then she began to notice falling.  This started in 04/2013.  The diplopia was horizontal in nature and she initially thought that it was due to lyrica.  The lyrica was d/c and it initally seemed to get better but then it came back.  It gets better if she closes an eye (doesn't  know if it matters which eye she closes).  She has some degree of diplopia daily but mostly at night.  She sees Dr. [name] at [facility] and was told that nothing was wrong with the eyes.    10/29/14 update:  The patient is following up today.  Overall, she states that she is feeling well.  She really hasn't had significant diplopia since our last visit.  She had her DaT scan on 10/14/2014 and while tracer uptake was present in the bilateral attainment, it was decreased on the right putamen relative to the right caudate, which can be seen in early parkinsonian syndromes.  The patient admits that she is still very slow.  She saw a different ophthalmologist [facility] imaging since our last visit and states that the diagnosis of glaucoma was retracted and everything looked well.  She denies any falls since our last visit no hallucinations.  12/10/14 update:  The patient is accompanied by her daughter who supplements the history.  I started the patient on carbidopa/levodopa 25/100 tid last visit.  Today, she states that she thinks that the medication makes her fibromyalgia worse but admits that it could be the weather.  Admits to a fall yesterday.   She was going into the house and there is an odd shaped stair (L shaped landing) and she fell backwards.   She called 911 and she went to [facility] and she had a CT brain and she states that it  was negative.  Had her INR checked and it was 2.6.  Does state that she was vomiting on way on the way to the hospital so they kept her for a few hours.  Still nauseated some today but has been nauseated even prior to that and wonders if the medication contributes.  She did not take any carbidopa/levodopa today.   03/09/14 update:  The patient follows up today.  Last visit, she complained about multiple side effects of levodopa including the fact that she thought that it made her fibromyalgia worse.  Although I told her that I was not convinced that it caused many of these side  effects, we ultimately decided to try rytary instead.  She started with 95 mg 3 times a day and worked up to 145 mg 3 times a day.  She called me on October 20 to state that she was doing well and then called back on November 11 to state that the medication was causing dizziness and diplopia.  She wanted to decrease the medication.  We explained that sometimes the disease can cause the symptoms and so I had her hold the medication for a few days and she stated that the dizziness and diplopia resolved.  She asked again to go back down to the 95 mg 3 times a day, which we ultimately did.  She feels well going to PT.  She is not exercising otherwise.  She asks me for a referral to pain management for her fibromyalgia.  She has not had falls.  No hallucinations.    07/13/15 update:  The patient follows up today.  This patient is accompanied in the office by her daughter who supplements the history.  She is on Rytary, 95 mg 3 times per day.  She doesn't think that it wears off.  It is very expensive but she didn't qualify for the [facility] program.  She did physical therapy since our last visit.  She was referred to pain management with Dr. Letta Pate.  I reviewed his records and appreciate the input.  She was started on Cymbalta and she states that she is doing well with that and ultram.  She felt great yesterday but doesn't today and attributes that to the rain.  No hallucinations.  Still with diplopia.  Daughter asks about referral to neuro-ophth.  10/21/15 update:  The patient follows up today, unaccompanied today.  She remains on Rytary, 95 mg 3 times per day.  Last visit, she wanted a referral to neuro-ophthalmology for her diplopia.  Her appt is next month.  She has not yet had that appt that she did call me and stated that she wanted to go off of the rytary and back on the older version of levodopa because she thought that the rytary was causing double vision.  We had explained to her that this just did not make  physiologic sense because they all essentially have the same ingredient.  Therefore, she is still on the rytary. She has to cover an eye (either) to see well.   Had a fall from bathroom into bedroom.  She bruised her arms.  One other fall that was very similar.  Has been "on an off" with physical therapy.  Has a stationary bike and rides that 3 times a week in the house.    01/25/16 update:  The patient follows up today.   She is accompanied by her daughter who supplement the history.   Much has happened since our last visit and I  reviewed her records.  She saw Dr. Hassell Done on 11/22/2015.  He repeated an acetylcholine receptor antibody and obtain a CT of the orbits. He sent the patient to see Jerre Simon, orthoptist, to see if prism would be of help with regard to her ocular motility disorder.  She had a carotid ultrasound on 10/28/2015 that demonstrated 1-39% stenosis bilaterally.  She was admitted to the hospital from October 13 to October 16 and then went to inpatient rehabilitation from October 16 to October 27.  She is now in subacute rehab.  She got up in the middle of the night to use the bathroom and lost her balance and fell through the sheet rock.  She sustained a moderately displaced fracture is seen involving the posterior spinous process of C5.  Daughter thinks more confused since fall.  Having trouble getting into phone so had to take passcode off of phone. Trouble paying bills.  Goal is to go back home independently but family doesn't want that.  Family would like assisted living with monitored meds.  Daughter doesn't think that meds were being taken correctly.  CT brain has been negative for bleed.  Saw Dr. Beverly Gust in hospital for brief neuropsych exam and she recommended complete neuropsych testing in 3 months.  Still on Rytary 95 mg tid.    05/24/16 update:  Patient follows up today, accompanied by her daughter who supplements the history.  I have reviewed numerous records since our last visit.   She is now living at [facility].  She was in the hospital from February 24 to 05/03/2016.  She had fallen out of bed and hit her head.  She did not injure herself, but was found to have a urinary tract infection and mild sepsis and was admitted for that.  She is still on Rytary, 95 mg 3 times per day.  She met with Dr. Si Raider in mid to late January, but did not end up completing neuropsych testing.  They did question when she was in the hospital if she had some dementia.   PREVIOUS MEDICATIONS: Sinemet  ALLERGIES:   Allergies  Allergen Reactions  . Crestor [Rosuvastatin Calcium] Other (See Comments)    Feeling very bad, aching all over.  . Erythromycin Hives  . Gabapentin Other (See Comments)    Blurred vision  . Pregabalin Other (See Comments)    Crossed vision.  . Carbidopa Other (See Comments)    Headaches, nausea, dizziness  . Macrobid [Nitrofurantoin] Hives  . Losartan Other (See Comments)    CURRENT MEDICATIONS:  Outpatient Encounter Prescriptions as of 05/24/2016  Medication Sig  . acetaminophen (TYLENOL) 500 MG tablet Take 500 mg by mouth every 6 (six) hours as needed for mild pain.  Marland Kitchen albuterol (PROVENTIL HFA;VENTOLIN HFA) 108 (90 Base) MCG/ACT inhaler Inhale 2 puffs into the lungs every 6 (six) hours as needed for wheezing or shortness of breath.  Marland Kitchen atorvastatin (LIPITOR) 10 MG tablet Take 1 tablet (10 mg total) by mouth daily.  Marland Kitchen azelastine (ASTELIN) 0.1 % nasal spray Place 1 spray into both nostrils 2 (two) times daily. Use in each nostril as directed  . Carbidopa-Levodopa ER (RYTARY) 23.75-95 MG CPCR Take 1 tablet by mouth 3 (three) times daily.  . celecoxib (CELEBREX) 100 MG capsule Take 1 capsule (100 mg total) by mouth 2 (two) times daily.  . cetirizine (ZYRTEC) 10 MG tablet Take 1 tablet (10 mg total) by mouth daily.  . Cholecalciferol (VITAMIN D3) 5000 UNITS CAPS Take 5,000 Units by mouth daily.   Marland Kitchen  cyanocobalamin 500 MCG tablet Take 500 mcg by mouth daily.  .  DULoxetine (CYMBALTA) 60 MG capsule Take 1 capsule (60 mg total) by mouth daily.  . fluticasone (FLONASE) 50 MCG/ACT nasal spray Place 1 spray into both nostrils daily.  . magnesium oxide (MAG-OX) 400 (241.3 Mg) MG tablet Take 0.5 tablets (200 mg total) by mouth daily.  . Menthol, Topical Analgesic, (BIOFREEZE EX) Apply 1 application topically 4 (four) times daily. Apply to affected area.  . meropenem 1 g in sodium chloride 0.9 % 100 mL Inject 1 g into the vein every 8 (eight) hours. 10 days from 2/26 and then stop  . methocarbamol (ROBAXIN) 500 MG tablet Take 500 mg by mouth every 6 (six) hours as needed for muscle spasms.  Marland Kitchen omega-3 acid ethyl esters (LOVAZA) 1 g capsule Take 1 g by mouth daily.   . Oxycodone HCl 10 MG TABS Take 1/2 tablet daily as needed for neck pain. If pain persists may take another 1/2 tablet per day  . potassium chloride SA (K-DUR,KLOR-CON) 20 MEQ tablet Take 1 tablet (20 mEq total) by mouth daily.  Marland Kitchen SYNTHROID 150 MCG tablet Take 1 tablet (150 mcg total) by mouth daily before breakfast.  . telmisartan-hydrochlorothiazide (MICARDIS HCT) 40-12.5 MG per tablet Take 1 tablet by mouth daily.   Marland Kitchen warfarin (COUMADIN) 2 MG tablet Take 2 mg by mouth See admin instructions. Take 2 mg only on  Saturday.  . warfarin (COUMADIN) 4 MG tablet Take 4 mg by mouth daily. 4 mg every day except on Saturday. Saturday, patient takes 2 mg.  . zolpidem (AMBIEN) 5 MG tablet Take 1 tablet (5 mg total) by mouth at bedtime. Use ONLY WHEN NEEDED for sleep. Does not have to be used daily   No facility-administered encounter medications on file as of 05/24/2016.     PAST MEDICAL HISTORY:   Past Medical History:  Diagnosis Date  . Arthritis   . Chronic insomnia   . Complication of anesthesia    severe itching night of surgery requiring meds- also couldnt swallow- throat "paralyzed""  . Fibromyalgia   . Glaucoma   . Hypercholesterolemia   . Hyperlipidemia   . Hypertension    PCP Dr Cari Caraway- [place]  05/15/11 with clearance and note on chart,    chest x ray, EKG 12/12 [facility], eccho 7/11 [facility]  . Hypothyroidism    s/p graves disease  . Kidney stone   . PD (Parkinson's disease) ([place])   . Peripheral vascular disease ([place])    PULMONARY EMBOLUS x 2- 2011, 2012/ FOLLOWED BY DR [id number] 12/12 [facility]   PT [facility] WILL STOP COUMADIN 3/12 and begin LOVONOX 05/17/11 as previously instructed  . Sinus infection    at present- dr Honor Loh PA aware- was seen there and at PCP 05/10/11  . Thyroid disease    Hypothyroidism    PAST SURGICAL HISTORY:   Past Surgical History:  Procedure Laterality Date  . ABDOMINAL HYSTERECTOMY    . APPENDECTOMY    . CATARACT EXTRACTION Bilateral   . CHOLECYSTECTOMY    . COLONOSCOPY    . EXPLORATORY LAPAROTOMY    . JOINT REPLACEMENT     left knee  7/12  . TOTAL KNEE ARTHROPLASTY  05/22/2011   Procedure: TOTAL KNEE ARTHROPLASTY;  Surgeon: Mauri Pole, MD;  Location: [facility];  Service: Orthopedics;  Laterality: Right;    SOCIAL HISTORY:   Social History   Social History  . Marital status: Widowed    Spouse  name: N/A  . Number of children: 2  . Years of education: N/A   Occupational History  . Not on file.   Social History Main Topics  . Smoking status: Former Smoker    Packs/day: 1.00    Quit date: 05/10/1989  . Smokeless tobacco: Never Used  . Alcohol use 0.0 oz/week     Comment: one drink once a month  . Drug use: No  . Sexual activity: Not on file   Other Topics Concern  . Not on file   Social History Narrative   Lives at home alone   Drinks caffeine occasionally     FAMILY HISTORY:   Family Status  Relation Status  . Mother Deceased at age 45   peritonitis  . Father Deceased at age 34   stroke, prostate cancer  . Sister Alive   10 siblings - 1 cerebral palsy  . Brother Alive    ROS:  A complete 10 system review of systems was obtained and was unremarkable apart from what is mentioned above.  PHYSICAL EXAMINATION:    VITALS:    There were no vitals filed for this visit. Wt Readings from Last 3 Encounters:  04/29/16 201 lb (91.2 kg)  04/17/16 195 lb 6.4 oz (88.6 kg)  04/04/16 199 lb (90.3 kg)     GEN:  The patient appears stated age and is in NAD. HEENT:  Normocephalic, atraumatic.  CV:  RRR Lungs:  CTAB.   Neck/HEME:  There is a soft right carotid bruit.   There is decreased range of motion of the neck.  She has difficulty turning the head to the right.  Neurological examination:  Orientation:  Montreal Cognitive Assessment  01/25/2016  Visuospatial/ Executive (0/5) 2  Naming (0/3) 3  Attention: Read list of digits (0/2) 2  Attention: Read list of letters (0/1) 0  Attention: Serial 7 subtraction starting at 100 (0/3) 2  Language: Repeat phrase (0/2) 2  Language : Fluency (0/1) 0  Abstraction (0/2) 2  Delayed Recall (0/5) 5  Orientation (0/6) 5  Total 23  Adjusted Score (based on education) 23   Cranial nerves: There is good facial symmetry.  There is facial hypomimia.   The speech is fluent and clear.  There are no square wave jerks.  EOMI.   She has no difficulty with the guttural sounds.  Soft palate rises symmetrically and there is no tongue deviation. Hearing is intact to conversational tone. Sensation: Sensation is intact to light touch throughout. Motor: Strength is 5/5 in the bilateral upper and lower extremities.   Shoulder shrug is equal and symmetric.  There is no pronator drift.  There are no fasciculations noted.   Movement examination: Tone: There is normal tone in the bilateral upper extremities.  The tone in the lower extremities is normal.  Abnormal movements: None, even with distraction procedures Coordination:  There is minimal decreased toe taps on the L Gait and Station: The patient has no difficulty arising out of a deep-seated chair without the use of the hands.  Stride length is much decreased today with decreased arm swing.    ASSESSMENT/PLAN:  1.  Parkinsonism with  possible MSA  -  She has some mild cervical dystonia (has difficulty turning the head to the right) and had early diplopia and falls.  Her DaT scan demonstrated decreased uptake of the tracer in the right putamen, but it was very mild.   I am not convinced that everything that she reports as "side  effects" of levodopa are really side effects.  She thought that it made her fibromyalgia worse.  She has also thought that rytary 145 mg tid caused diplopia and she backed down to 95 mg 3 times per day and for a while she thought she did well with this, but is now stating that this is causing diplopia.  I have explained to her that this is not the cause of her diplopia.  Daughter reveals today that she does not think that her mother was even taking the medication correctly when she was living alone.  Patient currently in subacute rehabilitation.  I do not think she should go back to living alone, but I think she would be a great candidate for assisted living.  She has the resources to make this happen and we talked in detail about places that would be good for her.  -falling backward and recommended a merry walker.  A prescription was written for that today.  I wrote a prescription for this last visit as well, but she did not get it.  -would recommend trying to get off ambien as many falls are in the nighttime and her daughter thinks Ambien is contributing.  -She met with Dr. Si Raider at the end of January, but has not yet completed the neuropsych testing. 2.  Fall with fx of C5 spinous process  -did inpatient rehab stay after this.  Now doing subacute rehab  -talked to her about assisted living facilities.   2.  Fibromyalgia  -seeing Dr. Letta Pate 3.  R carotid bruit  -had carotid u/s in 10/2015 that was normal.  Will reorder.   4.  .Much greater than 50% of this visit was spent in counseling with the patient.  Total face to face time:  40 min

## 2016-05-24 ENCOUNTER — Telehealth: Payer: Self-pay | Admitting: Neurology

## 2016-05-24 ENCOUNTER — Ambulatory Visit: Payer: Medicare Other | Admitting: Neurology

## 2016-05-24 DIAGNOSIS — I739 Peripheral vascular disease, unspecified: Secondary | ICD-10-CM | POA: Diagnosis not present

## 2016-05-24 DIAGNOSIS — I10 Essential (primary) hypertension: Secondary | ICD-10-CM | POA: Diagnosis not present

## 2016-05-24 DIAGNOSIS — R41841 Cognitive communication deficit: Secondary | ICD-10-CM | POA: Diagnosis not present

## 2016-05-24 DIAGNOSIS — M797 Fibromyalgia: Secondary | ICD-10-CM | POA: Diagnosis not present

## 2016-05-24 DIAGNOSIS — G20 Parkinson's disease: Secondary | ICD-10-CM | POA: Diagnosis not present

## 2016-05-24 DIAGNOSIS — F339 Major depressive disorder, recurrent, unspecified: Secondary | ICD-10-CM | POA: Diagnosis not present

## 2016-05-24 NOTE — Telephone Encounter (Signed)
Patient called wanting to see if she could pick up Rytary samples? Her # [phone]

## 2016-05-25 MED ORDER — CARBIDOPA-LEVODOPA ER 23.75-95 MG PO CPCR: 1.0000 | ORAL_CAPSULE | Freq: Three times a day (TID) | ORAL | 0 refills | Status: DC

## 2016-05-25 NOTE — Telephone Encounter (Signed)
Patient made aware samples at the front for pick up.

## 2016-05-29 DIAGNOSIS — I10 Essential (primary) hypertension: Secondary | ICD-10-CM | POA: Diagnosis not present

## 2016-05-29 DIAGNOSIS — I739 Peripheral vascular disease, unspecified: Secondary | ICD-10-CM | POA: Diagnosis not present

## 2016-05-29 DIAGNOSIS — G20 Parkinson's disease: Secondary | ICD-10-CM | POA: Diagnosis not present

## 2016-05-29 DIAGNOSIS — F339 Major depressive disorder, recurrent, unspecified: Secondary | ICD-10-CM | POA: Diagnosis not present

## 2016-05-29 DIAGNOSIS — M797 Fibromyalgia: Secondary | ICD-10-CM | POA: Diagnosis not present

## 2016-05-29 DIAGNOSIS — R41841 Cognitive communication deficit: Secondary | ICD-10-CM | POA: Diagnosis not present

## 2016-05-31 ENCOUNTER — Ambulatory Visit (INDEPENDENT_AMBULATORY_CARE_PROVIDER_SITE_OTHER): Payer: Medicare Other | Admitting: Family Medicine

## 2016-05-31 ENCOUNTER — Encounter: Payer: Self-pay | Admitting: Family Medicine

## 2016-05-31 VITALS — BP 127/64 | HR 100 | Temp 97.9°F | Ht 67.0 in | Wt 198.2 lb

## 2016-05-31 DIAGNOSIS — N390 Urinary tract infection, site not specified: Secondary | ICD-10-CM | POA: Diagnosis not present

## 2016-05-31 DIAGNOSIS — A419 Sepsis, unspecified organism: Secondary | ICD-10-CM

## 2016-05-31 DIAGNOSIS — F5104 Psychophysiologic insomnia: Secondary | ICD-10-CM | POA: Diagnosis not present

## 2016-05-31 DIAGNOSIS — I10 Essential (primary) hypertension: Secondary | ICD-10-CM | POA: Diagnosis not present

## 2016-05-31 DIAGNOSIS — M797 Fibromyalgia: Secondary | ICD-10-CM | POA: Diagnosis not present

## 2016-05-31 DIAGNOSIS — F339 Major depressive disorder, recurrent, unspecified: Secondary | ICD-10-CM | POA: Diagnosis not present

## 2016-05-31 DIAGNOSIS — R41841 Cognitive communication deficit: Secondary | ICD-10-CM | POA: Diagnosis not present

## 2016-05-31 DIAGNOSIS — D6859 Other primary thrombophilia: Secondary | ICD-10-CM | POA: Diagnosis not present

## 2016-05-31 DIAGNOSIS — G20 Parkinson's disease: Secondary | ICD-10-CM | POA: Diagnosis not present

## 2016-05-31 DIAGNOSIS — I739 Peripheral vascular disease, unspecified: Secondary | ICD-10-CM | POA: Diagnosis not present

## 2016-05-31 LAB — POCT INR: INR: 1.7

## 2016-05-31 MED ORDER — ZOLPIDEM TARTRATE 5 MG PO TABS: 5.0000 mg | ORAL_TABLET | Freq: Every day | ORAL | 5 refills | Status: DC

## 2016-05-31 MED ORDER — ZOLPIDEM TARTRATE 5 MG PO TABS: 5.0000 mg | ORAL_TABLET | Freq: Every day | ORAL | 0 refills | Status: DC

## 2016-05-31 NOTE — Patient Instructions (Addendum)
I wrote an rx for your Ambien today- please give this to your nurse at [facility]. I will fax in this order as well Your INR is a bit low today; I will call [facility] and we will adjust your coumadin dose a bit We are going to check a urine culture today to make sure all is cleared up Take care!

## 2016-05-31 NOTE — Progress Notes (Signed)
Pre visit review using our clinic review tool, if applicable. No additional management support is needed unless otherwise documented below in the visit note. 

## 2016-05-31 NOTE — Progress Notes (Signed)
[place] at [facility] [address] [place], [place] [address] [phone]  Date:  05/31/2016   Name:  [name] [name]   DOB:  [birth year]   MRN:  [id number]  PCP:  Lamar Blinks, MD    Chief Complaint: Follow-up (Hypercoagulable State. INR check today was 1.7. Was also admitted to the Hospital on 04/29/2016 for acute cystitis.)   History of Present Illness:  [name] [name] is a 71 y.o. very pleasant female patient who presents with the following:  Patient was admitted to the [facility] from 04-2416 to 05-03-16.  Patient discharged form the hospital to [facility], for rehab & IV antibiotics.    She was was in [place] for 2 weeks- her PICC line was removed following her treatment.  She is now back at [facility] where she normally lives.  [place]  [phone]  She notes that Nanine Means is expensive and she is thinking about moving to the [place]  She notes that "I hurt all over but that's normal.  I feel fine other than that."  No urinary sx, no fever She has not noted any unusual bleeding and she does not bruise too easily She needs a refill of her [name]- she takes this every night. She has been taking this since her husband died about 17 years ago.    Her 57 yo grandson is at [facility] for testing today for his seizures.   Lab Results  Component Value Date   TSH 1.071 05/02/2016   She is not sure of her current coumadin dose- I will need to call and adjust her dosage at [facility]   Partial hospital course, from discharge summary:  Presented with weakness dizziness for the past few days as well as a fall the day prior to admission. She was found to have UTI secondary to ESBL Escherichia coli. See below for further details   Brief Hospital Course: Sepsis due to ESBL Escherichia coli UTI: Sepsis pathophysiology has resolved, she appears significantly improved. She no longer has leukocytosis.  Clinically she has a nontoxic appearance. Initially she was on broad-spectrum antibiotic with cefepime, once the urine culture showed ESBL Escherichia coli-she was transitioned to meropenem. PICC line has been placed. Prior M.D. discussed with infectious disease, recommendations for a total of 10 days of therapy from 2/26. Please repeat CBC in 1 week, and removed PICC line once patient completes antimicrobial therapy.  Acute metabolic encephalopathy: Resolved. Likely secondary to above, probably has mild dementia/cognitive dysfunction. She is completely awake and alert this morning.  Parkinson's disease:Continue her home carbidopa levodopa  Hypertension:Continue Avapro and HCTZ.  Hyperthyroidism/Graves' disease >>now hypothyroidism:Continue Synthroid  History of DG:LOVFIEPP Coumadin and discharge, continue Coumadin on discharge, please continue to check INR periodically.   Hyperlipidemia:Continue Lipitor  Status post fall:left knee with significant bruising and swelling, physical therapy evaluated patient and recommended SNF, patient agreeable.Note Hip x-ray was negative for fractures.CT scan of the head as well as C-spine without any acute findings  Patient a resident of ALF.  Here today to have INR checked and follow-up from hospital and SNF admissions. Will check a urine culture for her today Her CBC and BMP looked fine by the time she was discharged from hospital Thyroid level was normal recently Her neck still hurts at times She is able to walk wihtout her walker some of the time which is good progress   Lab Results  Component Value Date  TSH 1.071 05/02/2016     BP Readings from Last 3 Encounters:  05/31/16 127/64  05/03/16 (!) 145/73  04/17/16 120/62     Patient Active Problem List   Diagnosis Date Noted  . UTI (urinary tract infection) 04/29/2016  . SIRS (systemic inflammatory response syndrome) ([place]) 04/29/2016  . Hyperlipidemia 04/29/2016  . Hypothyroidism  04/29/2016  . Hematuria 04/05/2016  . Cough 04/05/2016  . Benign essential HTN   . Intractable pain 12/17/2015  . Displaced fracture of fifth cervical vertebra ([place]) 12/17/2015  . Gait disturbance 12/17/2015  . Trochanteric bursitis of both hips 09/13/2015  . Chronic low back pain 06/07/2015  . Fibromyalgia syndrome 05/17/2015  . Thyroid disease 05/17/2015  . Parkinson's disease ([place]) 05/17/2015  . Hypercoagulable state ([place]) 02/07/2012  . History of pulmonary embolism 02/07/2012  . S/P right TKA 05/22/2011    Past Medical History:  Diagnosis Date  . Arthritis   . Chronic insomnia   . Complication of anesthesia    severe itching night of surgery requiring meds- also couldnt swallow- throat "paralyzed""  . Fibromyalgia   . Glaucoma   . Hypercholesterolemia   . Hyperlipidemia   . Hypertension    PCP Dr Cari Caraway- [place]  05/15/11 with clearance and note on chart,    chest x ray, EKG 12/12 EPIC, eccho 7/11 EPIC  . Hypothyroidism    s/p graves disease  . Kidney stone   . PD (Parkinson's disease) ([place])   . Peripheral vascular disease ([place])    PULMONARY EMBOLUS x 2- 2011, 2012/ FOLLOWED BY DR ODOGWU-LOV 12/12 EPIC   PT STAES WILL STOP COUMADIN 3/12 and begin LOVONOX 05/17/11 as previously instructed  . Sinus infection    at present- dr Honor Loh PA aware- was seen there and at PCP 05/10/11  . Thyroid disease    Hypothyroidism    Past Surgical History:  Procedure Laterality Date  . ABDOMINAL HYSTERECTOMY    . APPENDECTOMY    . CATARACT EXTRACTION Bilateral   . CHOLECYSTECTOMY    . COLONOSCOPY    . EXPLORATORY LAPAROTOMY    . JOINT REPLACEMENT     left knee  7/12  . TOTAL KNEE ARTHROPLASTY  05/22/2011   Procedure: TOTAL KNEE ARTHROPLASTY;  Surgeon: Mauri Pole, MD;  Location: [facility];  Service: Orthopedics;  Laterality: Right;    Social History  Substance Use Topics  . Smoking status: Former Smoker    Packs/day: 1.00    Quit date: 05/10/1989  . Smokeless tobacco: Never  Used  . Alcohol use 0.0 oz/week     Comment: one drink once a month    Family History  Problem Relation Age of Onset  . Prostate cancer Father   . Stroke Father     Allergies  Allergen Reactions  . Crestor [Rosuvastatin Calcium] Other (See Comments)    Feeling very bad, aching all over.  . Erythromycin Hives  . Gabapentin Other (See Comments)    Blurred vision  . Pregabalin Other (See Comments)    Crossed vision.  . Carbidopa Other (See Comments)    Headaches, nausea, dizziness  . Macrobid [Nitrofurantoin] Hives  . Losartan Other (See Comments)    Medication list has been reviewed and updated.  Current Outpatient Prescriptions on File Prior to Visit  Medication Sig Dispense Refill  . acetaminophen (TYLENOL) 500 MG tablet Take 500 mg by mouth every 6 (six) hours as needed for mild pain.    Marland Kitchen albuterol (PROVENTIL HFA;VENTOLIN HFA) 108 (90 Base) MCG/ACT  inhaler Inhale 2 puffs into the lungs every 6 (six) hours as needed for wheezing or shortness of breath. 1 Inhaler 0  . atorvastatin (LIPITOR) 10 MG tablet Take 1 tablet (10 mg total) by mouth daily. 30 tablet 1  . azelastine (ASTELIN) 0.1 % nasal spray Place 1 spray into both nostrils 2 (two) times daily. Use in each nostril as directed    . Carbidopa-Levodopa ER (RYTARY) 23.75-95 MG CPCR Take 1 tablet by mouth 3 (three) times daily. 200 capsule 0  . celecoxib (CELEBREX) 100 MG capsule Take 1 capsule (100 mg total) by mouth 2 (two) times daily. 60 capsule 1  . cetirizine (ZYRTEC) 10 MG tablet Take 1 tablet (10 mg total) by mouth daily. 30 tablet 1  . Cholecalciferol (VITAMIN D3) 5000 UNITS CAPS Take 5,000 Units by mouth daily.     . cyanocobalamin 500 MCG tablet Take 500 mcg by mouth daily.    . DULoxetine (CYMBALTA) 60 MG capsule Take 1 capsule (60 mg total) by mouth daily. 30 capsule 3  . fluticasone (FLONASE) 50 MCG/ACT nasal spray Place 1 spray into both nostrils daily.    . magnesium oxide (MAG-OX) 400 (241.3 Mg) MG tablet  Take 0.5 tablets (200 mg total) by mouth daily. 30 tablet 0  . Menthol, Topical Analgesic, (BIOFREEZE EX) Apply 1 application topically 4 (four) times daily. Apply to affected area.    . meropenem 1 g in sodium chloride 0.9 % 100 mL Inject 1 g into the vein every 8 (eight) hours. 10 days from 2/26 and then stop    . methocarbamol (ROBAXIN) 500 MG tablet Take 500 mg by mouth every 6 (six) hours as needed for muscle spasms.    Marland Kitchen omega-3 acid ethyl esters (LOVAZA) 1 g capsule Take 1 g by mouth daily.     . Oxycodone HCl 10 MG TABS Take 1/2 tablet daily as needed for neck pain. If pain persists may take another 1/2 tablet per day 5 tablet 0  . potassium chloride SA (K-DUR,KLOR-CON) 20 MEQ tablet Take 1 tablet (20 mEq total) by mouth daily. 30 tablet 0  . SYNTHROID 150 MCG tablet Take 1 tablet (150 mcg total) by mouth daily before breakfast. 90 tablet 3  . telmisartan-hydrochlorothiazide (MICARDIS HCT) 40-12.5 MG per tablet Take 1 tablet by mouth daily.   1  . warfarin (COUMADIN) 2 MG tablet Take 2 mg by mouth See admin instructions. Take 2 mg only on  Saturday.    . warfarin (COUMADIN) 4 MG tablet Take 4 mg by mouth daily. 4 mg every day except on Saturday. Saturday, patient takes 2 mg.     No current facility-administered medications on file prior to visit.     Review of Systems:  As per HPI- otherwise negative. appetitie is good No abd pain No fever or chills Wt Readings from Last 3 Encounters:  05/31/16 198 lb 3.2 oz (89.9 kg)  04/29/16 201 lb (91.2 kg)  04/17/16 195 lb 6.4 oz (88.6 kg)   Pulse Readings from Last 3 Encounters:  05/31/16 100  05/03/16 87  04/17/16 (!) 102     Physical Examination: Vitals:   05/31/16 0933  BP: 127/64  Pulse: 100  Temp: 97.9 F (36.6 C)   Vitals:   05/31/16 0933  Weight: 198 lb 3.2 oz (89.9 kg)  Height: 5\' 7"  (1.702 m)   Body mass index is 31.04 kg/m. Ideal Body Weight: Weight in (lb) to have BMI = 25: 159.3  GEN: WDWN, NAD, Non-toxic,  A  & O x 3, overweight, looks well HEENT: Atraumatic, Normocephalic. Neck supple. No masses, No LAD. Ears and Nose: No external deformity. CV: RRR, No M/G/R. No JVD. No thrill. No extra heart sounds. PULM: CTA B, no wheezes, crackles, rhonchi. No retractions. No resp. distress. No accessory muscle use. ABD: S, NT, ND, +BS. No rebound. No HSM. EXTR: No c/c/e NEURO Normal gait for pt, uses a walker and is somewhat slow PSYCH: Normally interactive. Conversant. Not depressed or anxious appearing.  Calm demeanor.   Results for orders placed or performed in visit on 05/31/16  POCT INR  Result Value Ref Range   INR 1.7    Pulse Readings from Last 3 Encounters:  05/31/16 100  05/03/16 87  04/17/16 (!) 102    Assessment and Plan:  Hypercoagulable state ([place]) - Plan: POCT INR  Sepsis due to urinary tract infection ([place]) - Plan: Urine culture  Psychophysiological insomnia - Plan: zolpidem (AMBIEN) 5 MG tablet  Here today to follow-up on her INR She is slightly low today- will need to adjust her coumadin and repeat in a week Will repeat a urine culture to ensure her recent UTI is resolved Refilled her [name]- this med is not ideal for a senior, but she has been safely using it for many years and does not feel that she will be able to stop using this medication  Signed Lamar Blinks, MD  I have made multiple attempts to locate someone at [facility] but there is no answer.  I have left a message requesting a call back to discuss her coumadin dose   Reached them on 3/29- spoke with her med tech.  She is currently taking coumadin: 2 mg one day, 4 mg the other 6 days = 26 mg total weekly Will increase by approx 10%- 4 mg every day  = 28 mg Repeat INR in one week Will fax order to [place] at [phone]

## 2016-06-05 ENCOUNTER — Telehealth: Payer: Self-pay | Admitting: Family Medicine

## 2016-06-05 DIAGNOSIS — G20 Parkinson's disease: Secondary | ICD-10-CM | POA: Diagnosis not present

## 2016-06-05 DIAGNOSIS — R41841 Cognitive communication deficit: Secondary | ICD-10-CM | POA: Diagnosis not present

## 2016-06-05 DIAGNOSIS — F339 Major depressive disorder, recurrent, unspecified: Secondary | ICD-10-CM | POA: Diagnosis not present

## 2016-06-05 DIAGNOSIS — I10 Essential (primary) hypertension: Secondary | ICD-10-CM | POA: Diagnosis not present

## 2016-06-05 DIAGNOSIS — M797 Fibromyalgia: Secondary | ICD-10-CM | POA: Diagnosis not present

## 2016-06-05 DIAGNOSIS — I739 Peripheral vascular disease, unspecified: Secondary | ICD-10-CM | POA: Diagnosis not present

## 2016-06-05 NOTE — Telephone Encounter (Signed)
[name] [name]- can you please ask lab what happened to her urine culture?  If it got lost we can have her come in for a repeat urine sample at her convenience.  Thank you! [place]

## 2016-06-06 ENCOUNTER — Other Ambulatory Visit: Payer: Medicare Other

## 2016-06-06 DIAGNOSIS — N390 Urinary tract infection, site not specified: Secondary | ICD-10-CM | POA: Diagnosis not present

## 2016-06-06 DIAGNOSIS — A419 Sepsis, unspecified organism: Secondary | ICD-10-CM | POA: Diagnosis not present

## 2016-06-06 NOTE — Telephone Encounter (Signed)
Urine culture is in the sytem as needing to be collected so pt will need to come in to leave sample. Tried to contact pt to setup lab visit. No answer, left voicemail asking pt to return call.

## 2016-06-06 NOTE — Telephone Encounter (Signed)
Patient returned call, informed patient of message below and patient states she will follow up with her daughter regarding transportation and will try to come in today for labs only.

## 2016-06-07 DIAGNOSIS — F339 Major depressive disorder, recurrent, unspecified: Secondary | ICD-10-CM | POA: Diagnosis not present

## 2016-06-07 DIAGNOSIS — I739 Peripheral vascular disease, unspecified: Secondary | ICD-10-CM | POA: Diagnosis not present

## 2016-06-07 DIAGNOSIS — M797 Fibromyalgia: Secondary | ICD-10-CM | POA: Diagnosis not present

## 2016-06-07 DIAGNOSIS — R41841 Cognitive communication deficit: Secondary | ICD-10-CM | POA: Diagnosis not present

## 2016-06-07 DIAGNOSIS — I10 Essential (primary) hypertension: Secondary | ICD-10-CM | POA: Diagnosis not present

## 2016-06-07 DIAGNOSIS — G20 Parkinson's disease: Secondary | ICD-10-CM | POA: Diagnosis not present

## 2016-06-07 LAB — URINE CULTURE

## 2016-06-09 NOTE — Addendum Note (Signed)
Addended by: Lamar Blinks C on: 06/09/2016 08:30 AM   Modules accepted: Orders

## 2016-06-12 DIAGNOSIS — M797 Fibromyalgia: Secondary | ICD-10-CM | POA: Diagnosis not present

## 2016-06-12 DIAGNOSIS — G20 Parkinson's disease: Secondary | ICD-10-CM | POA: Diagnosis not present

## 2016-06-12 DIAGNOSIS — I739 Peripheral vascular disease, unspecified: Secondary | ICD-10-CM | POA: Diagnosis not present

## 2016-06-12 DIAGNOSIS — I10 Essential (primary) hypertension: Secondary | ICD-10-CM | POA: Diagnosis not present

## 2016-06-12 DIAGNOSIS — R41841 Cognitive communication deficit: Secondary | ICD-10-CM | POA: Diagnosis not present

## 2016-06-12 DIAGNOSIS — F339 Major depressive disorder, recurrent, unspecified: Secondary | ICD-10-CM | POA: Diagnosis not present

## 2016-06-14 DIAGNOSIS — G20 Parkinson's disease: Secondary | ICD-10-CM | POA: Diagnosis not present

## 2016-06-14 DIAGNOSIS — I739 Peripheral vascular disease, unspecified: Secondary | ICD-10-CM | POA: Diagnosis not present

## 2016-06-14 DIAGNOSIS — I10 Essential (primary) hypertension: Secondary | ICD-10-CM | POA: Diagnosis not present

## 2016-06-14 DIAGNOSIS — F339 Major depressive disorder, recurrent, unspecified: Secondary | ICD-10-CM | POA: Diagnosis not present

## 2016-06-14 DIAGNOSIS — M797 Fibromyalgia: Secondary | ICD-10-CM | POA: Diagnosis not present

## 2016-06-14 DIAGNOSIS — R41841 Cognitive communication deficit: Secondary | ICD-10-CM | POA: Diagnosis not present

## 2016-06-15 DIAGNOSIS — M7062 Trochanteric bursitis, left hip: Secondary | ICD-10-CM | POA: Diagnosis not present

## 2016-06-15 DIAGNOSIS — M7061 Trochanteric bursitis, right hip: Secondary | ICD-10-CM | POA: Diagnosis not present

## 2016-06-15 DIAGNOSIS — M25551 Pain in right hip: Secondary | ICD-10-CM | POA: Diagnosis not present

## 2016-06-15 DIAGNOSIS — M25552 Pain in left hip: Secondary | ICD-10-CM | POA: Diagnosis not present

## 2016-06-19 DIAGNOSIS — I739 Peripheral vascular disease, unspecified: Secondary | ICD-10-CM | POA: Diagnosis not present

## 2016-06-19 DIAGNOSIS — I10 Essential (primary) hypertension: Secondary | ICD-10-CM | POA: Diagnosis not present

## 2016-06-19 DIAGNOSIS — M797 Fibromyalgia: Secondary | ICD-10-CM | POA: Diagnosis not present

## 2016-06-19 DIAGNOSIS — R41841 Cognitive communication deficit: Secondary | ICD-10-CM | POA: Diagnosis not present

## 2016-06-19 DIAGNOSIS — F339 Major depressive disorder, recurrent, unspecified: Secondary | ICD-10-CM | POA: Diagnosis not present

## 2016-06-19 DIAGNOSIS — G20 Parkinson's disease: Secondary | ICD-10-CM | POA: Diagnosis not present

## 2016-06-23 DIAGNOSIS — F339 Major depressive disorder, recurrent, unspecified: Secondary | ICD-10-CM | POA: Diagnosis not present

## 2016-06-23 DIAGNOSIS — I739 Peripheral vascular disease, unspecified: Secondary | ICD-10-CM | POA: Diagnosis not present

## 2016-06-23 DIAGNOSIS — M797 Fibromyalgia: Secondary | ICD-10-CM | POA: Diagnosis not present

## 2016-06-23 DIAGNOSIS — G20 Parkinson's disease: Secondary | ICD-10-CM | POA: Diagnosis not present

## 2016-06-23 DIAGNOSIS — R41841 Cognitive communication deficit: Secondary | ICD-10-CM | POA: Diagnosis not present

## 2016-06-23 DIAGNOSIS — I10 Essential (primary) hypertension: Secondary | ICD-10-CM | POA: Diagnosis not present

## 2016-06-27 ENCOUNTER — Ambulatory Visit (INDEPENDENT_AMBULATORY_CARE_PROVIDER_SITE_OTHER): Payer: Medicare Other | Admitting: Medical

## 2016-06-27 ENCOUNTER — Encounter: Payer: Self-pay | Admitting: Medical

## 2016-06-27 VITALS — BP 132/76 | HR 90 | Temp 98.0°F | Resp 16 | Ht 67.0 in | Wt 192.2 lb

## 2016-06-27 DIAGNOSIS — G4709 Other insomnia: Secondary | ICD-10-CM

## 2016-06-27 DIAGNOSIS — F5104 Psychophysiologic insomnia: Secondary | ICD-10-CM

## 2016-06-27 DIAGNOSIS — D6859 Other primary thrombophilia: Secondary | ICD-10-CM

## 2016-06-27 DIAGNOSIS — R399 Unspecified symptoms and signs involving the genitourinary system: Secondary | ICD-10-CM | POA: Diagnosis not present

## 2016-06-27 LAB — POC URINALSYSI DIPSTICK (AUTOMATED)
BILIRUBIN UA: NEGATIVE
GLUCOSE UA: NEGATIVE
KETONES UA: NEGATIVE
Nitrite, UA: NEGATIVE
Spec Grav, UA: >=1.03 — AB (ref 1.010–1.025)
Urobilinogen, UA: NEGATIVE E.U./dL — AB
pH, UA: 6 (ref 5.0–8.0)

## 2016-06-27 MED ORDER — ONDANSETRON 4 MG PO TBDP: 4.0000 mg | ORAL_TABLET | Freq: Three times a day (TID) | ORAL | 0 refills | Status: DC | PRN

## 2016-06-27 MED ORDER — ZOLPIDEM TARTRATE 5 MG PO TABS: 5.0000 mg | ORAL_TABLET | Freq: Every day | ORAL | 2 refills | Status: DC

## 2016-06-27 MED ORDER — CEPHALEXIN 500 MG PO CAPS: 500.0000 mg | ORAL_CAPSULE | Freq: Two times a day (BID) | ORAL | 0 refills | Status: DC

## 2016-06-27 NOTE — Progress Notes (Signed)
Pre visit review using our clinic review tool, if applicable. No additional management support is needed unless otherwise documented below in the visit note. 

## 2016-06-27 NOTE — Progress Notes (Signed)
Subjective:    Patient ID: [name] [name], female    DOB: 1945-08-17, 71 y.o.   MRN: [id number]  HPI   Pt in today reporting urinary symptoms for past 3 days.  Dysuria- yes Frequent urination-not much but severe urge. Hesitancy-no Suprapubic pressure-mild  Fever-Subjective mild warm when she came in our office. chills-no Nausea-no Vomiting-3 times today earlier.  CVA pain-no  History of UTI-yes Gross hematuria-  In past had needed hx of uti in the past. In February had e coli. Was resistant bacteria and she was hospitalized due to infection.  Pt also states she about to be out of ambien with only one tab left. Dr. Lorelei Pont has rx'd her [name] in the past but 5 mg dose.  Pt has hx of PE in the past. 3 weeks ago her inr was 1.7.       Review of Systems  Constitutional: Negative for chills, fatigue and fever.       Possible subjective fever.  HENT: Negative for congestion, ear discharge and ear pain.   Respiratory: Negative for cough, chest tightness, shortness of breath and wheezing.   Cardiovascular: Negative for chest pain and palpitations.  Gastrointestinal: Positive for vomiting. Negative for abdominal pain, anal bleeding, constipation and nausea.  Genitourinary: Positive for dysuria, frequency and urgency. Negative for difficulty urinating, hematuria and vaginal pain.  Musculoskeletal: Negative for back pain, gait problem, joint swelling and myalgias.  Skin: Negative for rash.  Neurological: Negative for dizziness, speech difficulty, weakness, numbness and headaches.  Hematological: Negative for adenopathy.  Psychiatric/Behavioral: Positive for sleep disturbance. Negative for behavioral problems. The patient is not nervous/anxious.     Past Medical History:  Diagnosis Date  . Arthritis   . Chronic insomnia   . Complication of anesthesia    severe itching night of surgery requiring meds- also couldnt swallow- throat "paralyzed""  . Fibromyalgia   . Glaucoma   .  Hypercholesterolemia   . Hyperlipidemia   . Hypertension    PCP Dr Cari Caraway- [place]  05/15/11 with clearance and note on chart,    chest x ray, EKG 12/12 EPIC, eccho 7/11 EPIC  . Hypothyroidism    s/p graves disease  . Kidney stone   . PD (Parkinson's disease) ([place])   . Peripheral vascular disease ([place])    PULMONARY EMBOLUS x 2- 2011, 2012/ FOLLOWED BY DR ODOGWU-LOV 12/12 EPIC   PT STAES WILL STOP COUMADIN 3/12 and begin LOVONOX 05/17/11 as previously instructed  . Sinus infection    at present- dr Honor Loh PA aware- was seen there and at PCP 05/10/11  . Thyroid disease    Hypothyroidism     Social History   Social History  . Marital status: Widowed    Spouse name: N/A  . Number of children: 2  . Years of education: N/A   Occupational History  . Not on file.   Social History Main Topics  . Smoking status: Former Smoker    Packs/day: 1.00    Quit date: 05/10/1989  . Smokeless tobacco: Never Used  . Alcohol use 0.0 oz/week     Comment: one drink once a month  . Drug use: No  . Sexual activity: Not on file   Other Topics Concern  . Not on file   Social History Narrative   Lives at home alone   Drinks caffeine occasionally     Past Surgical History:  Procedure Laterality Date  . ABDOMINAL HYSTERECTOMY    . APPENDECTOMY    .  CATARACT EXTRACTION Bilateral   . CHOLECYSTECTOMY    . COLONOSCOPY    . EXPLORATORY LAPAROTOMY    . JOINT REPLACEMENT     left knee  7/12  . TOTAL KNEE ARTHROPLASTY  05/22/2011   Procedure: TOTAL KNEE ARTHROPLASTY;  Surgeon: Mauri Pole, MD;  Location: [facility];  Service: Orthopedics;  Laterality: Right;    Family History  Problem Relation Age of Onset  . Prostate cancer Father   . Stroke Father     Allergies  Allergen Reactions  . Crestor [Rosuvastatin Calcium] Other (See Comments)    Feeling very bad, aching all over.  . Erythromycin Hives  . Gabapentin Other (See Comments)    Blurred vision  . Pregabalin Other (See Comments)     Crossed vision.  . Carbidopa Other (See Comments)    Headaches, nausea, dizziness  . Macrobid [Nitrofurantoin] Hives  . Losartan Other (See Comments)    Current Outpatient Prescriptions on File Prior to Visit  Medication Sig Dispense Refill  . acetaminophen (TYLENOL) 500 MG tablet Take 500 mg by mouth every 6 (six) hours as needed for mild pain.    Marland Kitchen albuterol (PROVENTIL HFA;VENTOLIN HFA) 108 (90 Base) MCG/ACT inhaler Inhale 2 puffs into the lungs every 6 (six) hours as needed for wheezing or shortness of breath. 1 Inhaler 0  . atorvastatin (LIPITOR) 10 MG tablet Take 1 tablet (10 mg total) by mouth daily. 30 tablet 1  . azelastine (ASTELIN) 0.1 % nasal spray Place 1 spray into both nostrils 2 (two) times daily. Use in each nostril as directed    . Carbidopa-Levodopa ER (RYTARY) 23.75-95 MG CPCR Take 1 tablet by mouth 3 (three) times daily. 200 capsule 0  . celecoxib (CELEBREX) 100 MG capsule Take 1 capsule (100 mg total) by mouth 2 (two) times daily. 60 capsule 1  . cetirizine (ZYRTEC) 10 MG tablet Take 1 tablet (10 mg total) by mouth daily. 30 tablet 1  . Cholecalciferol (VITAMIN D3) 5000 UNITS CAPS Take 5,000 Units by mouth daily.     . cyanocobalamin 500 MCG tablet Take 500 mcg by mouth daily.    . DULoxetine (CYMBALTA) 60 MG capsule Take 1 capsule (60 mg total) by mouth daily. 30 capsule 3  . fluticasone (FLONASE) 50 MCG/ACT nasal spray Place 1 spray into both nostrils daily.    . magnesium oxide (MAG-OX) 400 (241.3 Mg) MG tablet Take 0.5 tablets (200 mg total) by mouth daily. 30 tablet 0  . Menthol, Topical Analgesic, (BIOFREEZE EX) Apply 1 application topically 4 (four) times daily. Apply to affected area.    . meropenem 1 g in sodium chloride 0.9 % 100 mL Inject 1 g into the vein every 8 (eight) hours. 10 days from 2/26 and then stop    . methocarbamol (ROBAXIN) 500 MG tablet Take 500 mg by mouth every 6 (six) hours as needed for muscle spasms.    Marland Kitchen omega-3 acid ethyl esters (LOVAZA)  1 g capsule Take 1 g by mouth daily.     . Oxycodone HCl 10 MG TABS Take 1/2 tablet daily as needed for neck pain. If pain persists may take another 1/2 tablet per day 5 tablet 0  . potassium chloride SA (K-DUR,KLOR-CON) 20 MEQ tablet Take 1 tablet (20 mEq total) by mouth daily. 30 tablet 0  . SYNTHROID 150 MCG tablet Take 1 tablet (150 mcg total) by mouth daily before breakfast. 90 tablet 3  . telmisartan-hydrochlorothiazide (MICARDIS HCT) 40-12.5 MG per tablet Take 1 tablet  by mouth daily.   1  . warfarin (COUMADIN) 2 MG tablet Take 2 mg by mouth See admin instructions. Take 2 mg only on  Saturday.    . warfarin (COUMADIN) 4 MG tablet Take 4 mg by mouth daily. 4 mg every day except on Saturday. Saturday, patient takes 2 mg.     No current facility-administered medications on file prior to visit.     BP 132/76 (BP Location: Left Arm, Patient Position: Sitting, Cuff Size: Normal)   Pulse 90   Temp 98 F (36.7 C) (Oral)   Resp 16   Ht 5\' 7"  (1.702 m)   Wt 192 lb 3.2 oz (87.2 kg)   SpO2 96%   BMI 30.10 kg/m       Objective:   Physical Exam  General Appearance- Not in acute distress.  HEENT Eyes- Scleraeral/Conjuntiva-bilat- Not Yellow. Mouth & Throat- Normal.  Chest and Lung Exam Auscultation: Breath sounds:-Normal. Adventitious sounds:- No Adventitious sounds.  Cardiovascular Auscultation:Rythm - Regular. Heart Sounds -Normal heart sounds.  Abdomen Inspection:-Inspection Normal.  Palpation/Perucssion: Palpation and Percussion of the abdomen reveal- faint suprapubic tender Tender, No Rebound tenderness, No rigidity(Guarding) and No Palpable abdominal masses.  Liver:-Normal.  Spleen:- Normal.   Back-no cva tenderness.       Assessment & Plan:  You appear to have a urinary tract infection. I am prescribing cephalexin antibiotic for the probable infection. Hydrate well. I am sending out a urine culture when you return with sample(very important to give [name] sample as  amount of urine you gave was not adequate and we will need culture to direct care in event of resistant bacteria. During the interim if your signs and symptoms worsen rather than improving please notify us. We will notify your when the culture results are back.  Rx zofran for nausea.  For insomnia  refill your ambien.  Your inr was in range but borderline. Cut back in on greens every other day.  Follow up in 7 days or as needed.

## 2016-06-27 NOTE — Patient Instructions (Signed)
You appear to have a urinary tract infection. I am prescribing cephalexin antibiotic for the probable infection. Hydrate well. I am sending out a urine culture when you return with sample(very important to give [name] sample as amount of urine you gave was not adequate and we will need culture to direct care in event of resistant bacteria. During the interim if your signs and symptoms worsen rather than improving please notify us. We will notify your when the culture results are back.  Rx zofran for nausea.  For insomnia  refill your ambien.  Your inr was in range but borderline. Cut back in on greens every other day.  Follow up in 7 days or as needed.

## 2016-06-28 ENCOUNTER — Telehealth: Payer: Self-pay | Admitting: Family Medicine

## 2016-06-28 NOTE — Telephone Encounter (Signed)
Pt called in. She says that Rx for zolpidem wasn't received by pharmacy. Please resend if possible. ALSO, pt says that she would like to check on urine results.   Please advise.     Pharmacy: [facility] [address] [place], [place]

## 2016-06-28 NOTE — Telephone Encounter (Signed)
Called medication ( Ambien) in to pharmacy. Patient notified. Informed patient that medication was called in to pharmacy and waiting on results from lab regarding Urine culture.

## 2016-06-29 ENCOUNTER — Telehealth: Payer: Self-pay | Admitting: Medical

## 2016-06-29 LAB — URINE CULTURE

## 2016-06-29 MED ORDER — AMOXICILLIN-POT CLAVULANATE 875-125 MG PO TABS: 1.0000 | ORAL_TABLET | Freq: Two times a day (BID) | ORAL | 0 refills | Status: DC

## 2016-06-29 NOTE — Telephone Encounter (Signed)
I would like pt to have nurse visit on June 30, 2016 for 1 gram rocpehin. Will you get her scheduled and call her today or tomorrow am.

## 2016-06-30 ENCOUNTER — Ambulatory Visit: Payer: Medicare Other

## 2016-06-30 ENCOUNTER — Ambulatory Visit (INDEPENDENT_AMBULATORY_CARE_PROVIDER_SITE_OTHER): Payer: Medicare Other

## 2016-06-30 DIAGNOSIS — N390 Urinary tract infection, site not specified: Secondary | ICD-10-CM | POA: Diagnosis not present

## 2016-06-30 DIAGNOSIS — N10 Acute tubulo-interstitial nephritis: Secondary | ICD-10-CM | POA: Diagnosis not present

## 2016-06-30 MED ORDER — CEFTRIAXONE SODIUM 1 G IJ SOLR
1.0000 g | Freq: Once | INTRAMUSCULAR | Status: AC
  Administered 2016-06-30: 1 g via INTRAMUSCULAR

## 2016-06-30 NOTE — Progress Notes (Addendum)
Pre visit review using our clinic tool,if applicable. No additional management support is needed unless otherwise documented below in the visit note.   Patient in for Rocephin injection per order from [facility], PA-C dated 06/29/16. Due to UTI  Given 1 GM IM Right Ventrogluteal. Patient tolerated well.   I agree with administration of rocephin for uti given by lpn.  [place]aguier, Percell Miller, PA-C

## 2016-06-30 NOTE — Telephone Encounter (Signed)
Pt notified of instructions and verbalized understanding. Nurse visit scheduled today at 2pm.

## 2016-07-07 ENCOUNTER — Other Ambulatory Visit: Payer: Self-pay | Admitting: Family Medicine

## 2016-07-07 ENCOUNTER — Other Ambulatory Visit: Payer: Self-pay | Admitting: Emergency Medicine

## 2016-07-07 NOTE — Telephone Encounter (Signed)
Caller name: Relationship to patient: Self Can be reached: [phone]  Pharmacy:  [place], [place] [phone] (Phone) [phone] (Fax)     Reason for call: refill telmisartan-hydrochlorothiazide (MICARDIS HCT) 40-12.5 MG per tablet [[id number]]

## 2016-07-07 NOTE — Telephone Encounter (Signed)
Allergy/contraindication: Losartan. Reaction unspecified. Last office visit: 05/31/16. Last refill not listed. Please advise.

## 2016-07-09 MED ORDER — SYNTHROID 150 MCG PO TABS: 150.0000 ug | ORAL_TABLET | Freq: Every day | ORAL | 3 refills | Status: DC

## 2016-07-09 MED ORDER — CELECOXIB 100 MG PO CAPS: 100.0000 mg | ORAL_CAPSULE | Freq: Two times a day (BID) | ORAL | 3 refills | Status: DC

## 2016-07-09 MED ORDER — TELMISARTAN-HCTZ 40-12.5 MG PO TABS: 1.0000 | ORAL_TABLET | Freq: Every day | ORAL | 3 refills | Status: DC

## 2016-07-09 NOTE — Telephone Encounter (Signed)
Called her to confirm that she is taking micardis as I had not rx this for her in the past  She confirmed that she had been taking it- also refilled her synthroid and celebrex per her request

## 2016-07-10 NOTE — Progress Notes (Signed)
[name] [name] was seen today in the movement disorders clinic for neurologic consultation at the request of [name], [name], MD.   This patient is accompanied in the office by her child who supplements the history.   Prior records made available to me were reviewed.  The patient first saw Dr. [name] in March, 2016 at which point she was complaining about symptoms of diplopia and weakness.  Because of the diplopia she had acetylcholine receptor antibodies that were performed that were negative.  She has had an MRI and MRA of the brain with and without gadolinium.  I reviewed these images.  These were done on 05/21/2014.  This just revealed mild small vessel disease.  She also had a carotid ultrasound with him in March, 2016 revealed less than 50% stenosis bilaterally.  She followed up with him on 07/23/2014 and started on carbidopa/levodopa 25/100, one tablet 3 times per day (8am, 12pm, 5pm, before the meals).  She called back to him and complained about diffuse psain, headaches, depression and nausea.  Although he was not completely convinced that some of these things were not associated with her fibromyalgia and situational depression (her grandson was leaving for college and he serves as a caregiver), it was decided to taper off of her levodopa on 09/16/2014.   She did this and is now off the medication.  She states today that she initially felt great on the medication but then felt that it caused side effects.  Now that she is off of it, however, she c/o difficulty with walking. They also discussed referrals to pain management and psychiatry.  She presents today for a second opinion.  Pt states that diplopia was her first sx and then she began to notice falling.  This started in 04/2013.  The diplopia was horizontal in nature and she initially thought that it was due to lyrica.  The lyrica was d/c and it initally seemed to get better but then it came back.  It gets better if she closes an eye  (doesn't know if it matters which eye she closes).  She has some degree of diplopia daily but mostly at night.  She sees Dr. [name] at [facility] and was told that nothing was wrong with the eyes.    10/29/14 update:  The patient is following up today.  Overall, she states that she is feeling well.  She really hasn't had significant diplopia since our last visit.  She had her DaT scan on 10/14/2014 and while tracer uptake was present in the bilateral attainment, it was decreased on the right putamen relative to the right caudate, which can be seen in early parkinsonian syndromes.  The patient admits that she is still very slow.  She saw a different ophthalmologist [facility] imaging since our last visit and states that the diagnosis of glaucoma was retracted and everything looked well.  She denies any falls since our last visit no hallucinations.  12/10/14 update:  The patient is accompanied by her daughter who supplements the history.  I started the patient on carbidopa/levodopa 25/100 tid last visit.  Today, she states that she thinks that the medication makes her fibromyalgia worse but admits that it could be the weather.  Admits to a fall yesterday.   She was going into the house and there is an odd shaped stair (L shaped landing) and she fell backwards.   She called 911 and she went to [facility] and she had a CT brain and she states  that it was negative.  Had her INR checked and it was 2.6.  Does state that she was vomiting on way on the way to the hospital so they kept her for a few hours.  Still nauseated some today but has been nauseated even prior to that and wonders if the medication contributes.  She did not take any carbidopa/levodopa today.   03/09/14 update:  The patient follows up today.  Last visit, she complained about multiple side effects of levodopa including the fact that she thought that it made her fibromyalgia worse.  Although I told her that I was not convinced that it caused many of these  side effects, we ultimately decided to try rytary instead.  She started with 95 mg 3 times a day and worked up to 145 mg 3 times a day.  She called me on October 20 to state that she was doing well and then called back on November 11 to state that the medication was causing dizziness and diplopia.  She wanted to decrease the medication.  We explained that sometimes the disease can cause the symptoms and so I had her hold the medication for a few days and she stated that the dizziness and diplopia resolved.  She asked again to go back down to the 95 mg 3 times a day, which we ultimately did.  She feels well going to PT.  She is not exercising otherwise.  She asks me for a referral to pain management for her fibromyalgia.  She has not had falls.  No hallucinations.    07/13/15 update:  The patient follows up today.  This patient is accompanied in the office by her daughter who supplements the history.  She is on Rytary, 95 mg 3 times per day.  She doesn't think that it wears off.  It is very expensive but she didn't qualify for the [facility] program.  She did physical therapy since our last visit.  She was referred to pain management with Dr. Letta Pate.  I reviewed his records and appreciate the input.  She was started on Cymbalta and she states that she is doing well with that and ultram.  She felt great yesterday but doesn't today and attributes that to the rain.  No hallucinations.  Still with diplopia.  Daughter asks about referral to neuro-ophth.  10/21/15 update:  The patient follows up today, unaccompanied today.  She remains on Rytary, 95 mg 3 times per day.  Last visit, she wanted a referral to neuro-ophthalmology for her diplopia.  Her appt is next month.  She has not yet had that appt that she did call me and stated that she wanted to go off of the rytary and back on the older version of levodopa because she thought that the rytary was causing double vision.  We had explained to her that this just did not  make physiologic sense because they all essentially have the same ingredient.  Therefore, she is still on the rytary. She has to cover an eye (either) to see well.   Had a fall from bathroom into bedroom.  She bruised her arms.  One other fall that was very similar.  Has been "on an off" with physical therapy.  Has a stationary bike and rides that 3 times a week in the house.    01/25/16 update:  The patient follows up today.   She is accompanied by her daughter who supplement the history.   Much has happened since our last visit  and I reviewed her records.  She saw Dr. Hassell Done on 11/22/2015.  He repeated an acetylcholine receptor antibody and obtain a CT of the orbits. He sent the patient to see Jerre Simon, orthoptist, to see if prism would be of help with regard to her ocular motility disorder.  She had a carotid ultrasound on 10/28/2015 that demonstrated 1-39% stenosis bilaterally.  She was admitted to the hospital from October 13 to October 16 and then went to inpatient rehabilitation from October 16 to October 27.  She is now in subacute rehab.  She got up in the middle of the night to use the bathroom and lost her balance and fell through the sheet rock.  She sustained a moderately displaced fracture is seen involving the posterior spinous process of C5.  Daughter thinks more confused since fall.  Having trouble getting into phone so had to take passcode off of phone. Trouble paying bills.  Goal is to go back home independently but family doesn't want that.  Family would like assisted living with monitored meds.  Daughter doesn't think that meds were being taken correctly.  CT brain has been negative for bleed.  Saw Dr. Beverly Gust in hospital for brief neuropsych exam and she recommended complete neuropsych testing in 3 months.  Still on Rytary 95 mg tid.    07/12/16 update:  Patient seen today in follow-up.  She is accompanied by her daughter who supplements the history.  I have reviewed records since our last  visit.  She was in the hospital in February with Escherichia coli sepsis secondary to UTI.  She was discharged to [facility] for rehabilitation following that.  She was not there for long and then she went back to [place] where she lived. She has subsequently moved to [place].  She moved there last month.  She lives independently.  She does own pills but daughter prepares pill box.  Meals are prepared.  She does own showers.  She just finished PT.  Daughter states only 1 fall and that was when she had that UTI and she was confused.  She got tangled in her gown and then fell.   Remains on Rytary, and is only on 95 mg 3 times per day.  She was supposed to have repeat neuropsych testing, but canceled that.  It was scheduled for 04/05/2016.   PREVIOUS MEDICATIONS: Sinemet  ALLERGIES:   Allergies  Allergen Reactions  . Crestor [Rosuvastatin Calcium] Other (See Comments)    Feeling very bad, aching all over.  . Erythromycin Hives  . Gabapentin Other (See Comments)    Blurred vision  . Pregabalin Other (See Comments)    Crossed vision.  . Carbidopa Other (See Comments)    Headaches, nausea, dizziness  . Macrobid [Nitrofurantoin] Hives  . Losartan Other (See Comments)    CURRENT MEDICATIONS:  Outpatient Encounter Prescriptions as of 07/12/2016  Medication Sig  . acetaminophen (TYLENOL) 500 MG tablet Take 500 mg by mouth every 6 (six) hours as needed for mild pain.  Marland Kitchen albuterol (PROVENTIL HFA;VENTOLIN HFA) 108 (90 Base) MCG/ACT inhaler Inhale 2 puffs into the lungs every 6 (six) hours as needed for wheezing or shortness of breath.  Marland Kitchen atorvastatin (LIPITOR) 10 MG tablet Take 1 tablet (10 mg total) by mouth daily.  . Carbidopa-Levodopa ER (RYTARY) 23.75-95 MG CPCR Take 1 tablet by mouth 3 (three) times daily.  . celecoxib (CELEBREX) 100 MG capsule Take 1 capsule (100 mg total) by mouth 2 (two) times daily.  . cetirizine (ZYRTEC)  10 MG tablet Take 1 tablet (10 mg total) by mouth daily.  .  Cholecalciferol (VITAMIN D3) 5000 UNITS CAPS Take 5,000 Units by mouth daily.   . cyanocobalamin 500 MCG tablet Take 500 mcg by mouth daily.  . DULoxetine (CYMBALTA) 60 MG capsule Take 1 capsule (60 mg total) by mouth daily.  . fluticasone (FLONASE) 50 MCG/ACT nasal spray Place 1 spray into both nostrils daily.  . magnesium oxide (MAG-OX) 400 (241.3 Mg) MG tablet Take 0.5 tablets (200 mg total) by mouth daily.  . Menthol, Topical Analgesic, (BIOFREEZE EX) Apply 1 application topically 4 (four) times daily. Apply to affected area.  . methocarbamol (ROBAXIN) 500 MG tablet Take 500 mg by mouth every 6 (six) hours as needed for muscle spasms.  Marland Kitchen omega-3 acid ethyl esters (LOVAZA) 1 g capsule Take 1 g by mouth daily.   . ondansetron (ZOFRAN ODT) 4 MG disintegrating tablet Take 1 tablet (4 mg total) by mouth every 8 (eight) hours as needed for nausea or vomiting.  . Oxycodone HCl 10 MG TABS Take 1/2 tablet daily as needed for neck pain. If pain persists may take another 1/2 tablet per day  . potassium chloride SA (K-DUR,KLOR-CON) 20 MEQ tablet Take 1 tablet (20 mEq total) by mouth daily.  Marland Kitchen SYNTHROID 150 MCG tablet Take 1 tablet (150 mcg total) by mouth daily before breakfast.  . telmisartan-hydrochlorothiazide (MICARDIS HCT) 40-12.5 MG tablet Take 1 tablet by mouth daily.  Marland Kitchen warfarin (COUMADIN) 2 MG tablet Take 2 mg by mouth See admin instructions. Take 2 mg only on  Saturday.  . warfarin (COUMADIN) 4 MG tablet Take 4 mg by mouth daily. 4 mg every day except on Saturday. Saturday, patient takes 2 mg.  . zolpidem (AMBIEN) 5 MG tablet Take 1 tablet (5 mg total) by mouth at bedtime. Use ONLY WHEN NEEDED for sleep. Does not have to be used daily  . [DISCONTINUED] Carbidopa-Levodopa ER (RYTARY) 23.75-95 MG CPCR Take 1 tablet by mouth 3 (three) times daily.  Marland Kitchen amoxicillin-clavulanate (AUGMENTIN) 875-125 MG tablet Take 1 tablet by mouth 2 (two) times daily. (Patient not taking: Reported on 07/12/2016)  .  [DISCONTINUED] azelastine (ASTELIN) 0.1 % nasal spray Place 1 spray into both nostrils 2 (two) times daily. Use in each nostril as directed  . [DISCONTINUED] meropenem 1 g in sodium chloride 0.9 % 100 mL Inject 1 g into the vein every 8 (eight) hours. 10 days from 2/26 and then stop   No facility-administered encounter medications on file as of 07/12/2016.     PAST MEDICAL HISTORY:   Past Medical History:  Diagnosis Date  . Arthritis   . Chronic insomnia   . Complication of anesthesia    severe itching night of surgery requiring meds- also couldnt swallow- throat "paralyzed""  . Fibromyalgia   . Glaucoma   . Hypercholesterolemia   . Hyperlipidemia   . Hypertension    PCP Dr Cari Caraway- [facility]  05/15/11 with clearance and note on chart,    chest x ray, EKG 12/12 EPIC, eccho 7/11 EP[facility]  . Hypothyroidism    s/p graves disease  . Kidney stone   . PD (Parkinson's disease) ([place])   . Peripheral vascular disease ([place])    PULMONARY EMBOLUS x 2- 2011, 2012/ FOLLOWED BY DR [id number]-[id number] 12/12 EP[facility]   PT STAES WILL STOP COUMADIN 3/12 and begin LOVONOX 05/17/11 as previously instructed  . Sinus infection    at present- dr Honor Loh PA aware- was seen there and at PCP  05/10/11  . Thyroid disease    Hypothyroidism    PAST SURGICAL HISTORY:   Past Surgical History:  Procedure Laterality Date  . ABDOMINAL HYSTERECTOMY    . APPENDECTOMY    . CATARACT EXTRACTION Bilateral   . CHOLECYSTECTOMY    . COLONOSCOPY    . EXPLORATORY LAPAROTOMY    . JOINT REPLACEMENT     left knee  7/12  . TOTAL KNEE ARTHROPLASTY  05/22/2011   Procedure: TOTAL KNEE ARTHROPLASTY;  Surgeon: Mauri Pole, MD;  Location: [facility];  Service: Orthopedics;  Laterality: Right;    SOCIAL HISTORY:   Social History   Social History  . Marital status: Widowed    Spouse name: N/A  . Number of children: 2  . Years of education: N/A   Occupational History  . Not on file.   Social History Main Topics  . Smoking status: Former Smoker      Packs/day: 1.00    Quit date: 05/10/1989  . Smokeless tobacco: Never Used  . Alcohol use 0.0 oz/week     Comment: one drink once a month  . Drug use: No  . Sexual activity: Not on file   Other Topics Concern  . Not on file   Social History Narrative   Lives at home alone   Drinks caffeine occasionally     FAMILY HISTORY:   Family Status  Relation Status  . Mother Deceased at age 69   peritonitis  . Father Deceased at age 70   stroke, prostate cancer  . Sister Alive   10 siblings - 1 cerebral palsy  . Brother Alive    ROS:  A complete 10 system review of systems was obtained and was unremarkable apart from what is mentioned above.  PHYSICAL EXAMINATION:    VITALS:   Vitals:   07/12/16 0955  BP: 100/64  Pulse: (!) 106  SpO2: 97%  Weight: 194 lb (88 kg)  Height: 5\' 5"  (1.651 m)   Wt Readings from Last 3 Encounters:  07/12/16 194 lb (88 kg)  06/27/16 192 lb 3.2 oz (87.2 kg)  05/31/16 198 lb 3.2 oz (89.9 kg)     GEN:  The patient appears stated age and is in NAD. HEENT:  Normocephalic, atraumatic.  CV:  Tachycardic.  Regular rhythm.   Lungs:  CTAB.   Neck/HEME:  There is a soft right carotid bruit.   There is decreased range of motion of the neck.  She has difficulty turning the head to the right.  Neurological examination:  Orientation:  Montreal Cognitive Assessment  01/25/2016  Visuospatial/ Executive (0/5) 2  Naming (0/3) 3  Attention: Read list of digits (0/2) 2  Attention: Read list of letters (0/1) 0  Attention: Serial 7 subtraction starting at 100 (0/3) 2  Language: Repeat phrase (0/2) 2  Language : Fluency (0/1) 0  Abstraction (0/2) 2  Delayed Recall (0/5) 5  Orientation (0/6) 5  Total 23  Adjusted Score (based on education) 23   Cranial nerves: There is good facial symmetry.  There is facial hypomimia.   The speech is fluent and clear.  There are no square wave jerks.  EOMI.   She has no difficulty with the guttural sounds.  Soft palate  rises symmetrically and there is no tongue deviation. Hearing is intact to conversational tone. Sensation: Sensation is intact to light touch throughout. Motor: Strength is 5/5 in the bilateral upper and lower extremities.   Shoulder shrug is equal and symmetric.  There  is no pronator drift.  There are no fasciculations noted.   Movement examination: Tone: There is normal tone in the bilateral upper extremities.  The tone in the lower extremities is normal.  Abnormal movements: None, even with distraction procedures Coordination:  There is minimal decreased toe taps on the L Gait and Station: The patient walks with her regular walker today.  She shuffles after she has walked some.  ASSESSMENT/PLAN:  1.  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  Her DaT scan demonstrated decreased uptake of the tracer in the right putamen, but it was very mild.   I am not convinced that everything that she reports as "side effects" of levodopa are really side effects.  She thought that it made her fibromyalgia worse.  She has also thought that rytary 145 mg tid caused diplopia and she backed down to 95 mg 3 times per day.   Pt now in independent assisted living  -falling backward and recommended a merry walker again.  A prescription was written for that the last several visits.  Explained again that current walker doesn't prevent falls backward  -would recommend trying to get off ambien as many falls are in the nighttime and her daughter thinks Ambien is contributing.  -was supposed to have neuropsych testing in Jan but patient cancelled.  She had previously had testing in hospital and was recommended she f/u.  Told her to r/s 2.  Fall with fx of C5 spinous process  -did inpatient rehab stay after this.  Now doing subacute rehab  -talked to her about assisted living facilities.   2.   Fibromyalgia  -seeing Dr. Letta Pate 3.  R carotid bruit  -had carotid u/s in 10/2015 that was normal.  Will reorder.   4.  .Much greater than 50% of this visit was spent in counseling with the patient.  Total face to face time:  25 min

## 2016-07-12 ENCOUNTER — Ambulatory Visit (INDEPENDENT_AMBULATORY_CARE_PROVIDER_SITE_OTHER): Payer: Medicare Other | Admitting: Neurology

## 2016-07-12 ENCOUNTER — Encounter: Payer: Self-pay | Admitting: Neurology

## 2016-07-12 VITALS — BP 100/64 | HR 106 | Ht 65.0 in | Wt 194.0 lb

## 2016-07-12 DIAGNOSIS — R413 Other amnesia: Secondary | ICD-10-CM

## 2016-07-12 DIAGNOSIS — G232 Striatonigral degeneration: Secondary | ICD-10-CM | POA: Diagnosis not present

## 2016-07-12 MED ORDER — CARBIDOPA-LEVODOPA ER 23.75-95 MG PO CPCR: 1.0000 | ORAL_CAPSULE | Freq: Three times a day (TID) | ORAL | 0 refills | Status: DC

## 2016-07-12 NOTE — Patient Instructions (Signed)
1.  Get your merry walker 2.  Make your appointment for memory testing with Dr. Si Raider 3.  Have your physical therapist send me note/RX if he wants you to continue with PT

## 2016-08-01 ENCOUNTER — Telehealth: Payer: Self-pay | Admitting: Family Medicine

## 2016-08-01 NOTE — Telephone Encounter (Signed)
[name] [name] caled from [facility] wanting to get home PT evaluation [phone]  Verbal is ok

## 2016-08-02 DIAGNOSIS — H903 Sensorineural hearing loss, bilateral: Secondary | ICD-10-CM | POA: Diagnosis not present

## 2016-08-02 NOTE — Telephone Encounter (Signed)
Spoke to Sabino Donovan with [employer]. Verbal order given for PT evaluation. Sarah asked if I could fax over pt's last two office notes and demographic sheet to fax # [phone]. Fax sent, confirmation received

## 2016-08-03 DIAGNOSIS — I739 Peripheral vascular disease, unspecified: Secondary | ICD-10-CM | POA: Diagnosis not present

## 2016-08-03 DIAGNOSIS — G20 Parkinson's disease: Secondary | ICD-10-CM | POA: Diagnosis not present

## 2016-08-07 DIAGNOSIS — I739 Peripheral vascular disease, unspecified: Secondary | ICD-10-CM | POA: Diagnosis not present

## 2016-08-07 DIAGNOSIS — G20 Parkinson's disease: Secondary | ICD-10-CM | POA: Diagnosis not present

## 2016-08-09 DIAGNOSIS — G20 Parkinson's disease: Secondary | ICD-10-CM | POA: Diagnosis not present

## 2016-08-09 DIAGNOSIS — I739 Peripheral vascular disease, unspecified: Secondary | ICD-10-CM | POA: Diagnosis not present

## 2016-08-10 ENCOUNTER — Ambulatory Visit (INDEPENDENT_AMBULATORY_CARE_PROVIDER_SITE_OTHER): Payer: Medicare Other

## 2016-08-10 ENCOUNTER — Other Ambulatory Visit: Payer: Medicare Other

## 2016-08-10 DIAGNOSIS — Z7901 Long term (current) use of anticoagulants: Secondary | ICD-10-CM | POA: Diagnosis not present

## 2016-08-10 LAB — POCT INR: INR: 3.7

## 2016-08-10 NOTE — Patient Instructions (Signed)
Per Dr. Lorelei Pont patient to decrease the amount of green leafy vegetables she eats, Take Coumadin 2 mg today, then continue taking 4 mg daily. Return to office for INR check on June 14th @ 10:00 am

## 2016-08-10 NOTE — Progress Notes (Signed)
re visit review using our clinic tool,if applicable. No additional management support is needed unless otherwise documented below in the visit note.   Patient in today for INR Check per Dr. Janett Billow [name].  INR today = 3.7 Patients goal = 2.0-3.0  Patient denies any bleeding,bruising, but states she has been eating a lot of salads  Per Dr. Lorelei Pont patient to decrease the amount of green leafy vegetables she eats, Take Coumadin 2 mg today, then continue taking 4 mg daily. Return to office for INR check on June 14th @ 10:00 am. Patient agreed.

## 2016-08-11 ENCOUNTER — Telehealth: Payer: Self-pay | Admitting: *Deleted

## 2016-08-11 NOTE — Telephone Encounter (Signed)
Received Home Health Certification and Plan of Care; forwarded to provider/SLS 06/08

## 2016-08-14 ENCOUNTER — Telehealth: Payer: Self-pay | Admitting: Family Medicine

## 2016-08-14 NOTE — Telephone Encounter (Signed)
Caller name: Relationship to patient: Self Can be reached: [phone]  Pharmacy:  [facility], [place] [phone] (Phone) [phone] (Fax)     Reason for call: Refill atorvastatin (LIPITOR) 10 MG tablet

## 2016-08-15 DIAGNOSIS — G20 Parkinson's disease: Secondary | ICD-10-CM | POA: Diagnosis not present

## 2016-08-15 DIAGNOSIS — I739 Peripheral vascular disease, unspecified: Secondary | ICD-10-CM | POA: Diagnosis not present

## 2016-08-16 ENCOUNTER — Other Ambulatory Visit: Payer: Self-pay | Admitting: Emergency Medicine

## 2016-08-16 MED ORDER — ATORVASTATIN CALCIUM 10 MG PO TABS: 10.0000 mg | ORAL_TABLET | Freq: Every day | ORAL | 3 refills | Status: DC

## 2016-08-16 NOTE — Telephone Encounter (Signed)
Pt request refill on atorvastatin. There is an Allergy/Contraindication: Rosuvastatin Calcium. Is it ok to refill? Please advise.

## 2016-08-16 NOTE — Telephone Encounter (Signed)
Ok to continue, she had body aches with crestor, has tolerated lipitor 10 for some time.

## 2016-08-17 ENCOUNTER — Ambulatory Visit (INDEPENDENT_AMBULATORY_CARE_PROVIDER_SITE_OTHER): Payer: Medicare Other

## 2016-08-17 DIAGNOSIS — Z7901 Long term (current) use of anticoagulants: Secondary | ICD-10-CM

## 2016-08-17 LAB — POCT INR: INR: 3.2

## 2016-08-17 NOTE — Patient Instructions (Signed)
Per Dr. Lorelei Pont patient to take Coumadin 2 mg on Thursday and Friday and 4 mg on Saturday Sunday Monday  Tuesday and Wednesday. Return to office for INR re-check in 2 weeks. Appointment scheduled for June 28 @ 10:00 am

## 2016-08-17 NOTE — Progress Notes (Signed)
Pre visit review using our clinic tool,if applicable. No additional management support is needed unless otherwise documented below in the visit note.   Patient in for INR check per order from Dr. Janett Billow [name]. Last INR reading = 3.7 Patients goal is 2.0-3.0.  Patient taking 4 mg Coumadin daily at this time.   INR today = 3.2  Per Dr. Lorelei Pont patient to take Coumadin 2 mg on Thursday and Friday and 4 mg on Saturday Sunday Monday  Tuesday and Wednesday. Return to office for INR re-check in 2 weeks. Appointment scheduled for June 28 @ 10:00 am

## 2016-08-18 DIAGNOSIS — G20 Parkinson's disease: Secondary | ICD-10-CM | POA: Diagnosis not present

## 2016-08-18 DIAGNOSIS — I739 Peripheral vascular disease, unspecified: Secondary | ICD-10-CM | POA: Diagnosis not present

## 2016-08-22 DIAGNOSIS — I739 Peripheral vascular disease, unspecified: Secondary | ICD-10-CM | POA: Diagnosis not present

## 2016-08-22 DIAGNOSIS — G20 Parkinson's disease: Secondary | ICD-10-CM | POA: Diagnosis not present

## 2016-08-24 DIAGNOSIS — I739 Peripheral vascular disease, unspecified: Secondary | ICD-10-CM | POA: Diagnosis not present

## 2016-08-24 DIAGNOSIS — G20 Parkinson's disease: Secondary | ICD-10-CM | POA: Diagnosis not present

## 2016-08-28 ENCOUNTER — Telehealth: Payer: Self-pay | Admitting: Family Medicine

## 2016-08-28 NOTE — Telephone Encounter (Signed)
Caller name: Relationship to patient: Self Can be reached: [phone] Pharmacy:  [facility], [place] [phone] (Phone) [phone] (Fax)     Reason for call: Refill 90 day DULoxetine (CYMBALTA) 60 MG capsule

## 2016-08-31 ENCOUNTER — Telehealth: Payer: Self-pay | Admitting: Family Medicine

## 2016-08-31 ENCOUNTER — Ambulatory Visit (INDEPENDENT_AMBULATORY_CARE_PROVIDER_SITE_OTHER): Payer: Medicare Other

## 2016-08-31 ENCOUNTER — Telehealth: Payer: Self-pay | Admitting: Medical

## 2016-08-31 DIAGNOSIS — I2609 Other pulmonary embolism with acute cor pulmonale: Secondary | ICD-10-CM

## 2016-08-31 LAB — POCT INR: INR: 2.9

## 2016-08-31 MED ORDER — DULOXETINE HCL 60 MG PO CPEP: 60.0000 mg | ORAL_CAPSULE | Freq: Every day | ORAL | 3 refills | Status: DC

## 2016-08-31 NOTE — Telephone Encounter (Signed)
Ok will refill

## 2016-08-31 NOTE — Progress Notes (Addendum)
Pre visit review using our clinic tool,if applicable. No additional management support is needed unless otherwise documented below in the visit note.   Patient in for INR check per order from Dr. Lenna Sciara. [name] due to  HX of Pulmonary Embolism.  INR today = 2.9  Per Mackie Pai, PA patient to continue Coumadin 4 mg daily and return for re-check of INR in 1 month. Appointment scheduled for September 29, 2016 @ 10:00.   After reviewing this note there appears to be discrepancy in what I told RN and above instruction. I advised to continue same regimin as last week.  No change as she is in range now. But I do need her to follow up in one week.   I will send message to RN asking her to speak with me to clarify instruction and will get her to call pt if needed after we discuss.

## 2016-08-31 NOTE — Telephone Encounter (Signed)
Very important. There is discrepancy in what I believe I advised on her coumadin and instruction you sent to me in epic. Please contact me tomorrow/stop by so we can review again and then call pt. Thanks.

## 2016-08-31 NOTE — Patient Instructions (Signed)
Per Mackie Pai, PA patient to continue Coumadin 4 mg daily and return for re-check of INR in 1 month. Appointment scheduled for September 29, 2016 @ 10:00.

## 2016-09-01 ENCOUNTER — Telehealth: Payer: Self-pay

## 2016-09-01 NOTE — Telephone Encounter (Signed)
Pt does have hx of taking med differently than advise as I recall. With her inr in range she needs to continue with the regimen she took one week prior to most recent Inr which was in range. Even though it appears was different than what  Dr. Lorelei Pont advise.

## 2016-09-01 NOTE — Telephone Encounter (Signed)
Per E. Saguier call patient to see if she is taking Coumadin 4 mg daily. Patient states she is is taking this way.

## 2016-09-04 NOTE — Telephone Encounter (Signed)
Late entry for 09/01/16. Called patient and spoke with her on phone regarding Coumadin 4 mg. Patient states she is taking Coumadin 4 mg as discussed at appointment. Patient states she she will take this was until returning next week.  Provider notifir notified.

## 2016-09-05 DIAGNOSIS — G20 Parkinson's disease: Secondary | ICD-10-CM | POA: Diagnosis not present

## 2016-09-05 DIAGNOSIS — I739 Peripheral vascular disease, unspecified: Secondary | ICD-10-CM | POA: Diagnosis not present

## 2016-09-07 ENCOUNTER — Encounter: Payer: Self-pay | Admitting: Psychology

## 2016-09-07 ENCOUNTER — Ambulatory Visit (INDEPENDENT_AMBULATORY_CARE_PROVIDER_SITE_OTHER): Payer: Medicare Other | Admitting: Psychology

## 2016-09-07 DIAGNOSIS — F32A Depression, unspecified: Secondary | ICD-10-CM

## 2016-09-07 DIAGNOSIS — R4189 Other symptoms and signs involving cognitive functions and awareness: Secondary | ICD-10-CM | POA: Diagnosis not present

## 2016-09-07 DIAGNOSIS — G232 Striatonigral degeneration: Secondary | ICD-10-CM

## 2016-09-07 DIAGNOSIS — F329 Major depressive disorder, single episode, unspecified: Secondary | ICD-10-CM

## 2016-09-07 DIAGNOSIS — S060X0S Concussion without loss of consciousness, sequela: Secondary | ICD-10-CM | POA: Diagnosis not present

## 2016-09-07 MED ORDER — CARBIDOPA-LEVODOPA ER 23.75-95 MG PO CPCR: 1.0000 | ORAL_CAPSULE | Freq: Three times a day (TID) | ORAL | 0 refills | Status: DC

## 2016-09-07 NOTE — Progress Notes (Signed)
NEUROPSYCHOLOGICAL INTERVIEW (CPT: [id number])  Name: [name] [name] Date of Birth: [birth year] Date of Interview: 09/07/2016  Reason for Referral:  Shamia Uppal is a 71 y.o. female with history of parkinsonism (possible MSA) who is referred for neuropsychological evaluation by Dr. Wells Guiles Tat of [facility] due to concerns about cognitive changes. This patient is unaccompanied in the office for today's appointment.  History of Presenting Problem [per my 03/28/2016 visit with the patient and her daughter]:  [name] [name] is followed by Dr. [name] for parkinsonism (possible MSA). She saw Dr. [name] on 01/25/2016. MOCA at that time was 23/30. The patient had fallen on 12/17/2015 when she got up in the middle of the night to use the bathroom and lost her balance and fell through the sheet rock head-first. She denied LOC.She sustained a moderately displaced fracture involving the posterior spinous process of C5. She was hospitalized from 10/13-10/16 and in inpatient rehabilitation from 10/16-10/27. She is currently residing at [facility].   Prior to the fall, the patient was managing all instrumental ADLs and was driving even though she was told not to due to vision impairment. The patient also was working from home full-time. 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   The patient reported cognitive symptoms immediately following the probable concussion in October, but she feels these symptoms have resolved. She thinks she is back to her baseline cognitively. Her daughter, [name] [name], agrees that there has been significant improvement but does not think the patient is quite back to her baseline.   Immediately after the fall, the patient experienced confusion, disorientation, hallucinations and delusions, per her daughter. Even once she was out of the hospital, "her behavior was off". She had trouble using her cell phone and paying her  bills. [name] [name] feels the patient's processing speed is slowed.  [name] [name] does note that there were some mild cognitive issues prior to the fall. Old medication bottles with lots of medications in them were found when the family went in her home while she was in the hospital. It appears that she was not taking her medications as prescribed, and did not have a good system in place.   Since the fall, Ms. Criss has not driven. At [facility], the patient's medications are managed for her and administered directly to her. She does not have access to cooking/kitchen in her apartment and has to go to the dining room for all meals. [name] [name] was helping her with her finances, but Ms. Zufall recently started managing these on her own, and there have not been any problems that her daughter knows of.    Ms. Herford and [name] [name] denied the following symptoms at the present time: Forgetting recent conversations/events, misplacing/losing items, forgetting appointments/obligations, difficulty concentrating, starting but not finishing tasks, distractibility, communication difficulties, or comprehension difficulty.   The patient is unhappy with having to move to assisted living, but she understands this was the recommendation of her physicians. Her son and his girlfriend and children are currently living in her home. There is a lot of family stress surrounding her son, and her son and daughter are no longer speaking to one another. [name] [name] has tried to help the patient as much as she can, but she has three children (including 45yo with autism and an 83mo old baby) and works as well.   Ms. Hawkey reported increased depression secondary to her condition and reduced independence. She would like to return to work. She wishes  she could return to living in her home but she feels she does not have a choice in the matter. Her daughter has informed her that the other option is for her to move into an apartment and have in-home care.     The patient also reported a new fear of death since her fall. She is also worried she will have another fall and be more badly injured.  Her appetite is reduced, but she doesn't like the food at [place]. She is having sleep difficulty. She was previously taking 10 mg of Ambien nightly for 15 years. Her daughter was very concerned about balance issues and confusion secondary to Ambien. She observed her mother's mobility/balance being very impaired after the patient took Ambien. Also, the patient's falls tended to occur at nighttime. As such, Ambien was cut to 5 mg nightly. Now the patient feels her sleep is very impaired. She has difficulty falling asleep and staying asleep. She has started taking melatonin. I provided her with information regarding sleep hygiene as well.   Physically, the patient complains of chronic pain (she has fibromyalgia). She reported a "splitting headache" the day of her interview with me. She has not had any falls since the one in October. She is using a walker. She was recently discharged from physical therapy.   She has not had any hallucinations since being discharged from the hospital. She denied suicidal ideation or intention.   Psychiatric history was denied. She did have normal grief after the unexpected death of her husband and she had grief counseling for that. She noted that since then she has been more prone to get tearful. Recently, she is much more tearful.  Family history is reportedly significant for hand tremors in her late sister and late father, and cerebral palsy in her other sister (still living, age 47). There is no known family history of dementia.   Update 09/07/2016:  The patient did not complete her neuropsychological testing as scheduled earlier this year and was encouraged to reschedule by Dr. [name]. The patient follows up today to complete her evaluation.   She has had pneumonia twice this year and also was hospitalized from  04/29/2016-05/03/2016 for sepsis due to E Coli UTI. She is now living at [facility] reports she loves it. She no longer misses living at her own home. She denies any cognitive concerns at the present time. She states she had cognitive testing done in March when she was in [facility], and was told she is "as sharp as a tack".   She saw Dr. [name] on 07/12/2016. Since seeing Dr. [name], she says she has only had one fall, when she was trying to roll out of bed and caught the night stand. She did not hit the floor. She did not hit her head. She bruised her arm but no other injuries. She is faithfully using her walker. She notes that a merry walker was recommended by Dr. [name] but the patient feels it is like a "cage".   She is still not driving but she is managing her medications, finances and appointments independently with no reported problems. Meals are provided by the facility.  She continues to have insomnia which she blames on cutting back Ambien to 5 mg. She reports she sometimes does not fall asleep until 4:30 am.   She denies visual illusions or hallucinations. She continues to have occasional blurred/double vision and is seeing her eye doctor next week.  She admits she  has been "a little depressed", more tearful. She is worried about her grandchildren. She notes that her pain management doctor changed her Effexor to Cymbalta and she does not think this medication is working as well. She denied social isolation or vegetative symptoms of depression. She denied suicidal ideation or intention. She does not feel she needs counseling.   Social History: Born/Raised: [place] Education[employer] psychologist. Took some college courses. Occupational history: Currently on disability. She has worked in the Company secretary since 1991. She took early retirement from the company she was working for in [employer]. A company who used to use their services asked  her to help them with the same kind of work. She did this for 15 years, working from home. Marital history: Widowed with two children and five grandchildren. Alcohol/Tobacco/Substances: Essentially no alcohol (very rare/minimal). Former smoker (quit 1991). No history of recreational drug use.   Medical History: Past Medical History:  Diagnosis Date  . Arthritis   . Chronic insomnia   . Complication of anesthesia    severe itching night of surgery requiring meds- also couldnt swallow- throat "paralyzed""  . Fibromyalgia   . Glaucoma   . Hypercholesterolemia   . Hyperlipidemia   . Hypertension    PCP Dr Cari Caraway- [place]  05/15/11 with clearance and note on chart,    chest x ray, EKG 12/12 EPIC, eccho 7/11 EPIC  . Hypothyroidism    s/p graves disease  . Kidney stone   . PD (Parkinson's disease) ([place])   . Peripheral vascular disease ([place])    PULMONARY EMBOLUS x 2- 2011, 2012/ FOLLOWED BY DR ODOGWU-LOV 12/12 EPIC   PT STAES WILL STOP COUMADIN 3/12 and begin LOVONOX 05/17/11 as previously instructed  . Sinus infection    at present- dr Honor Loh PA aware- was seen there and at PCP 05/10/11  . Thyroid disease    Hypothyroidism      Current Medications:  Outpatient Encounter Prescriptions as of 09/07/2016  Medication Sig  . acetaminophen (TYLENOL) 500 MG tablet Take 500 mg by mouth every 6 (six) hours as needed for mild pain.  Marland Kitchen albuterol (PROVENTIL HFA;VENTOLIN HFA) 108 (90 Base) MCG/ACT inhaler Inhale 2 puffs into the lungs every 6 (six) hours as needed for wheezing or shortness of breath.  Marland Kitchen amoxicillin-clavulanate (AUGMENTIN) 875-125 MG tablet Take 1 tablet by mouth 2 (two) times daily.  Marland Kitchen atorvastatin (LIPITOR) 10 MG tablet Take 1 tablet (10 mg total) by mouth daily.  . Carbidopa-Levodopa ER (RYTARY) 23.75-95 MG CPCR Take 1 tablet by mouth 3 (three) times daily.  . celecoxib (CELEBREX) 100 MG capsule Take 1 capsule (100 mg total) by mouth 2 (two) times daily.  . cetirizine (ZYRTEC) 10  MG tablet Take 1 tablet (10 mg total) by mouth daily.  . Cholecalciferol (VITAMIN D3) 5000 UNITS CAPS Take 5,000 Units by mouth daily.   . cyanocobalamin 500 MCG tablet Take 500 mcg by mouth daily.  . DULoxetine (CYMBALTA) 60 MG capsule Take 1 capsule (60 mg total) by mouth daily.  . fluticasone (FLONASE) 50 MCG/ACT nasal spray Place 1 spray into both nostrils daily.  . magnesium oxide (MAG-OX) 400 (241.3 Mg) MG tablet Take 0.5 tablets (200 mg total) by mouth daily.  . Menthol, Topical Analgesic, (BIOFREEZE EX) Apply 1 application topically 4 (four) times daily. Apply to affected area.  . methocarbamol (ROBAXIN) 500 MG tablet Take 500 mg by mouth every 6 (six) hours as needed for muscle spasms.  Marland Kitchen omega-3 acid ethyl esters (  LOVAZA) 1 g capsule Take 1 g by mouth daily.   . ondansetron (ZOFRAN ODT) 4 MG disintegrating tablet Take 1 tablet (4 mg total) by mouth every 8 (eight) hours as needed for nausea or vomiting.  . Oxycodone HCl 10 MG TABS Take 1/2 tablet daily as needed for neck pain. If pain persists may take another 1/2 tablet per day  . potassium chloride SA (K-DUR,KLOR-CON) 20 MEQ tablet Take 1 tablet (20 mEq total) by mouth daily.  Marland Kitchen SYNTHROID 150 MCG tablet Take 1 tablet (150 mcg total) by mouth daily before breakfast.  . telmisartan-hydrochlorothiazide (MICARDIS HCT) 40-12.5 MG tablet Take 1 tablet by mouth daily.  Marland Kitchen warfarin (COUMADIN) 2 MG tablet Take 2 mg by mouth See admin instructions. Take 2 mg only on  Saturday.  . warfarin (COUMADIN) 4 MG tablet Take 4 mg by mouth daily. 4 mg every day except on Saturday. Saturday, patient takes 2 mg.  . zolpidem (AMBIEN) 5 MG tablet Take 1 tablet (5 mg total) by mouth at bedtime. Use ONLY WHEN NEEDED for sleep. Does not have to be used daily   No facility-administered encounter medications on file as of 09/07/2016.      Behavioral Observations:   Appearance: Neatly, casually and appropriately dressed and groomed Gait: Ambulated with walker;  cautious gait Speech: Fluent; normal rate, rhythm and volume. No word finding difficulty during conversational speech. Thought process: Linear, goal directed Affect: Somewhat flat/masked Interpersonal: Pleasant, appropriate   TESTING: There is medical necessity to proceed with neuropsychological assessment as the results will be used to aid in differential diagnosis and clinical decision-making and to inform specific treatment recommendations. Per medical records reviewed, there is high suspicion of a neurodegenerative disorder (ie, MSA) and neuropsychological testing will assist in differential diagnosis and treatment planning.  Following the clinical interview, the patient completed a full battery of neuropsychological testing with my psychometrician under my supervision.   PLAN: The patient will return to see me for a follow-up session at which time her test performances and my impressions and treatment recommendations will be reviewed in detail.  Full report to follow.

## 2016-09-07 NOTE — Progress Notes (Signed)
   Neuropsychology Note  Letzy Gullickson came in today for 1 hour of neuropsychological testing with technician, Milana Kidney, BS, under the supervision of Dr. Macarthur Critchley. The patient did not appear overtly distressed by the testing session, per behavioral observation or via self-report to the technician. Rest breaks were offered. Denean Pavon will return within 2 weeks for a feedback session with Dr. Si Raider at which time her test performances, clinical impressions and treatment recommendations will be reviewed in detail. The patient understands she can contact our office should she require our assistance before this time.  Full report to follow.

## 2016-09-08 DIAGNOSIS — I739 Peripheral vascular disease, unspecified: Secondary | ICD-10-CM | POA: Diagnosis not present

## 2016-09-08 DIAGNOSIS — G20 Parkinson's disease: Secondary | ICD-10-CM | POA: Diagnosis not present

## 2016-09-11 DIAGNOSIS — H532 Diplopia: Secondary | ICD-10-CM | POA: Diagnosis not present

## 2016-09-17 ENCOUNTER — Encounter: Payer: Self-pay | Admitting: Emergency Medicine

## 2016-09-17 ENCOUNTER — Encounter (HOSPITAL_COMMUNITY): Payer: Self-pay | Admitting: *Deleted

## 2016-09-17 ENCOUNTER — Ambulatory Visit (HOSPITAL_BASED_OUTPATIENT_CLINIC_OR_DEPARTMENT_OTHER)
Admission: AD | Admit: 2016-09-17 | Discharge: 2016-09-17 | Disposition: A | Discharge status: Home / Self Care | Payer: Medicare Other | Source: Ambulatory Visit

## 2016-09-17 ENCOUNTER — Inpatient Hospital Stay (HOSPITAL_COMMUNITY)
Admission: EM | Admit: 2016-09-17 | Discharge: 2016-09-23 | DRG: 871 | Disposition: A | Discharge status: Home Health | Payer: Medicare Other | Attending: Family Medicine | Admitting: Family Medicine

## 2016-09-17 ENCOUNTER — Other Ambulatory Visit: Payer: Self-pay

## 2016-09-17 ENCOUNTER — Emergency Department (HOSPITAL_COMMUNITY): Payer: Medicare Other

## 2016-09-17 ENCOUNTER — Inpatient Hospital Stay (HOSPITAL_COMMUNITY): Payer: Medicare Other

## 2016-09-17 ENCOUNTER — Emergency Department (HOSPITAL_BASED_OUTPATIENT_CLINIC_OR_DEPARTMENT_OTHER)
Admission: EM | Admit: 2016-09-17 | Discharge: 2016-09-17 | Disposition: A | Discharge status: Other Acute Inpatient Hospital | Payer: Medicare Other

## 2016-09-17 ENCOUNTER — Emergency Department (INDEPENDENT_AMBULATORY_CARE_PROVIDER_SITE_OTHER): Payer: Medicare Other

## 2016-09-17 ENCOUNTER — Emergency Department (INDEPENDENT_AMBULATORY_CARE_PROVIDER_SITE_OTHER)
Admission: EM | Admit: 2016-09-17 | Discharge: 2016-09-17 | Disposition: A | Discharge status: Home / Self Care | Payer: Medicare Other | Source: Home / Self Care | Attending: Family Medicine | Admitting: Family Medicine

## 2016-09-17 DIAGNOSIS — Z7901 Long term (current) use of anticoagulants: Secondary | ICD-10-CM

## 2016-09-17 DIAGNOSIS — W19XXXA Unspecified fall, initial encounter: Secondary | ICD-10-CM | POA: Insufficient documentation

## 2016-09-17 DIAGNOSIS — M1990 Unspecified osteoarthritis, unspecified site: Secondary | ICD-10-CM | POA: Diagnosis not present

## 2016-09-17 DIAGNOSIS — Z90710 Acquired absence of both cervix and uterus: Secondary | ICD-10-CM

## 2016-09-17 DIAGNOSIS — I10 Essential (primary) hypertension: Secondary | ICD-10-CM | POA: Diagnosis present

## 2016-09-17 DIAGNOSIS — E039 Hypothyroidism, unspecified: Secondary | ICD-10-CM | POA: Diagnosis present

## 2016-09-17 DIAGNOSIS — I739 Peripheral vascular disease, unspecified: Secondary | ICD-10-CM | POA: Diagnosis present

## 2016-09-17 DIAGNOSIS — Z66 Do not resuscitate: Secondary | ICD-10-CM | POA: Diagnosis present

## 2016-09-17 DIAGNOSIS — M7989 Other specified soft tissue disorders: Secondary | ICD-10-CM

## 2016-09-17 DIAGNOSIS — F329 Major depressive disorder, single episode, unspecified: Secondary | ICD-10-CM | POA: Diagnosis present

## 2016-09-17 DIAGNOSIS — Z881 Allergy status to other antibiotic agents status: Secondary | ICD-10-CM

## 2016-09-17 DIAGNOSIS — W19XXXD Unspecified fall, subsequent encounter: Secondary | ICD-10-CM | POA: Diagnosis not present

## 2016-09-17 DIAGNOSIS — M79674 Pain in right toe(s): Secondary | ICD-10-CM

## 2016-09-17 DIAGNOSIS — A4151 Sepsis due to Escherichia coli [E. coli]: Secondary | ICD-10-CM | POA: Diagnosis not present

## 2016-09-17 DIAGNOSIS — Z8042 Family history of malignant neoplasm of prostate: Secondary | ICD-10-CM | POA: Diagnosis not present

## 2016-09-17 DIAGNOSIS — E785 Hyperlipidemia, unspecified: Secondary | ICD-10-CM | POA: Diagnosis not present

## 2016-09-17 DIAGNOSIS — E7800 Pure hypercholesterolemia, unspecified: Secondary | ICD-10-CM | POA: Diagnosis present

## 2016-09-17 DIAGNOSIS — H409 Unspecified glaucoma: Secondary | ICD-10-CM | POA: Diagnosis not present

## 2016-09-17 DIAGNOSIS — Z7951 Long term (current) use of inhaled steroids: Secondary | ICD-10-CM

## 2016-09-17 DIAGNOSIS — R000 Tachycardia, unspecified: Secondary | ICD-10-CM | POA: Diagnosis not present

## 2016-09-17 DIAGNOSIS — Z9849 Cataract extraction status, unspecified eye: Secondary | ICD-10-CM | POA: Diagnosis not present

## 2016-09-17 DIAGNOSIS — M797 Fibromyalgia: Secondary | ICD-10-CM | POA: Diagnosis present

## 2016-09-17 DIAGNOSIS — G20 Parkinson's disease: Secondary | ICD-10-CM | POA: Diagnosis not present

## 2016-09-17 DIAGNOSIS — A419 Sepsis, unspecified organism: Secondary | ICD-10-CM

## 2016-09-17 DIAGNOSIS — E876 Hypokalemia: Secondary | ICD-10-CM | POA: Diagnosis present

## 2016-09-17 DIAGNOSIS — N390 Urinary tract infection, site not specified: Secondary | ICD-10-CM | POA: Diagnosis not present

## 2016-09-17 DIAGNOSIS — R6520 Severe sepsis without septic shock: Secondary | ICD-10-CM | POA: Diagnosis not present

## 2016-09-17 DIAGNOSIS — R4182 Altered mental status, unspecified: Secondary | ICD-10-CM | POA: Diagnosis not present

## 2016-09-17 DIAGNOSIS — Z888 Allergy status to other drugs, medicaments and biological substances status: Secondary | ICD-10-CM | POA: Diagnosis not present

## 2016-09-17 DIAGNOSIS — Z87440 Personal history of urinary (tract) infections: Secondary | ICD-10-CM

## 2016-09-17 DIAGNOSIS — L03115 Cellulitis of right lower limb: Secondary | ICD-10-CM | POA: Diagnosis present

## 2016-09-17 DIAGNOSIS — Z96653 Presence of artificial knee joint, bilateral: Secondary | ICD-10-CM | POA: Diagnosis present

## 2016-09-17 DIAGNOSIS — F0390 Unspecified dementia without behavioral disturbance: Secondary | ICD-10-CM | POA: Diagnosis present

## 2016-09-17 DIAGNOSIS — Z87891 Personal history of nicotine dependence: Secondary | ICD-10-CM | POA: Diagnosis not present

## 2016-09-17 DIAGNOSIS — M7731 Calcaneal spur, right foot: Secondary | ICD-10-CM | POA: Diagnosis not present

## 2016-09-17 DIAGNOSIS — R509 Fever, unspecified: Secondary | ICD-10-CM | POA: Diagnosis not present

## 2016-09-17 DIAGNOSIS — S0990XA Unspecified injury of head, initial encounter: Secondary | ICD-10-CM | POA: Diagnosis not present

## 2016-09-17 DIAGNOSIS — Z823 Family history of stroke: Secondary | ICD-10-CM

## 2016-09-17 DIAGNOSIS — G9341 Metabolic encephalopathy: Secondary | ICD-10-CM | POA: Diagnosis present

## 2016-09-17 DIAGNOSIS — Z86711 Personal history of pulmonary embolism: Secondary | ICD-10-CM | POA: Diagnosis not present

## 2016-09-17 DIAGNOSIS — M19071 Primary osteoarthritis, right ankle and foot: Secondary | ICD-10-CM | POA: Diagnosis not present

## 2016-09-17 DIAGNOSIS — S9031XA Contusion of right foot, initial encounter: Secondary | ICD-10-CM

## 2016-09-17 DIAGNOSIS — R6510 Systemic inflammatory response syndrome (SIRS) of non-infectious origin without acute organ dysfunction: Secondary | ICD-10-CM | POA: Diagnosis not present

## 2016-09-17 DIAGNOSIS — E538 Deficiency of other specified B group vitamins: Secondary | ICD-10-CM | POA: Diagnosis present

## 2016-09-17 HISTORY — DX: Sepsis, unspecified organism: A41.9

## 2016-09-17 LAB — CBC WITH DIFFERENTIAL/PLATELET
BASOS PCT: 0 %
Basophils Absolute: 0 10*3/uL (ref 0.0–0.1)
Eosinophils Absolute: 0.1 10*3/uL (ref 0.0–0.7)
Eosinophils Relative: 0 %
HCT: 42.1 % (ref 36.0–46.0)
Hemoglobin: 14.1 g/dL (ref 12.0–15.0)
LYMPHS ABS: 1.1 10*3/uL (ref 0.7–4.0)
Lymphocytes Relative: 6 %
MCH: 28.7 pg (ref 26.0–34.0)
MCHC: 33.5 g/dL (ref 30.0–36.0)
MCV: 85.6 fL (ref 78.0–100.0)
MONO ABS: 1.8 10*3/uL — AB (ref 0.1–1.0)
MONOS PCT: 11 %
NEUTROS PCT: 83 %
Neutro Abs: 14.4 10*3/uL — ABNORMAL HIGH (ref 1.7–7.7)
Platelets: 273 10*3/uL (ref 150–400)
RBC: 4.92 MIL/uL (ref 3.87–5.11)
RDW: 13 % (ref 11.5–15.5)
WBC: 17.5 10*3/uL — ABNORMAL HIGH (ref 4.0–10.5)

## 2016-09-17 LAB — COMPREHENSIVE METABOLIC PANEL
ALK PHOS: 120 U/L (ref 38–126)
ALT: 26 U/L (ref 14–54)
AST: 32 U/L (ref 15–41)
Albumin: 3.5 g/dL (ref 3.5–5.0)
Anion gap: 11 (ref 5–15)
BUN: 11 mg/dL (ref 6–20)
CALCIUM: 9.2 mg/dL (ref 8.9–10.3)
CO2: 23 mmol/L (ref 22–32)
CREATININE: 0.83 mg/dL (ref 0.44–1.00)
Chloride: 99 mmol/L — ABNORMAL LOW (ref 101–111)
GFR calc non Af Amer: >60 mL/min (ref >60)
Glucose, Bld: 181 mg/dL — ABNORMAL HIGH (ref 65–99)
Potassium: 3.8 mmol/L (ref 3.5–5.1)
SODIUM: 133 mmol/L — AB (ref 135–145)
Total Bilirubin: 1.2 mg/dL (ref 0.3–1.2)
Total Protein: 6.9 g/dL (ref 6.5–8.1)

## 2016-09-17 LAB — URINALYSIS, ROUTINE W REFLEX MICROSCOPIC
Bilirubin Urine: NEGATIVE
Glucose, UA: NEGATIVE mg/dL
Ketones, ur: NEGATIVE mg/dL
Nitrite: POSITIVE — AB
PH: 6 (ref 5.0–8.0)
Protein, ur: 100 mg/dL — AB
SPECIFIC GRAVITY, URINE: 1.013 (ref 1.005–1.030)
Squamous Epithelial / LPF: NONE SEEN

## 2016-09-17 LAB — I-STAT CG4 LACTIC ACID, ED: Lactic Acid, Venous: 2.11 mmol/L (ref 0.5–1.9)

## 2016-09-17 MED ORDER — FLUTICASONE PROPIONATE 50 MCG/ACT NA SUSP
1.0000 | Freq: Every day | NASAL | Status: DC
  Administered 2016-09-18 – 2016-09-22 (×5): 1 via NASAL
  Filled 2016-09-17: qty 16

## 2016-09-17 MED ORDER — OMEGA-3-ACID ETHYL ESTERS 1 G PO CAPS
1.0000 g | ORAL_CAPSULE | Freq: Every day | ORAL | Status: DC
  Administered 2016-09-18 – 2016-09-23 (×6): 1 g via ORAL
  Filled 2016-09-17 (×6): qty 1

## 2016-09-17 MED ORDER — VITAMIN D 1000 UNITS PO TABS
5000.0000 [IU] | ORAL_TABLET | Freq: Every day | ORAL | Status: DC
  Administered 2016-09-17 – 2016-09-23 (×7): 5000 [IU] via ORAL
  Filled 2016-09-17 (×7): qty 5

## 2016-09-17 MED ORDER — CYANOCOBALAMIN 500 MCG PO TABS
500.0000 ug | ORAL_TABLET | Freq: Every day | ORAL | Status: DC
  Administered 2016-09-18 – 2016-09-23 (×6): 500 ug via ORAL
  Filled 2016-09-17 (×6): qty 1

## 2016-09-17 MED ORDER — MAGNESIUM OXIDE 400 (241.3 MG) MG PO TABS
200.0000 mg | ORAL_TABLET | Freq: Every day | ORAL | Status: DC
  Administered 2016-09-18 – 2016-09-23 (×6): 200 mg via ORAL
  Filled 2016-09-17 (×6): qty 1

## 2016-09-17 MED ORDER — HYDROCHLOROTHIAZIDE 12.5 MG PO CAPS: 12.5000 mg | ORAL_CAPSULE | Freq: Every day | ORAL | Status: DC

## 2016-09-17 MED ORDER — ACETAMINOPHEN 325 MG PO TABS
650.0000 mg | ORAL_TABLET | Freq: Once | ORAL | Status: AC
  Administered 2016-09-17: 650 mg via ORAL
  Filled 2016-09-17: qty 2

## 2016-09-17 MED ORDER — SODIUM CHLORIDE 0.9 % IV BOLUS (SEPSIS): 2000.0000 mL | Freq: Once | INTRAVENOUS | Status: DC

## 2016-09-17 MED ORDER — LORATADINE 10 MG PO TABS
10.0000 mg | ORAL_TABLET | Freq: Every day | ORAL | Status: DC
  Administered 2016-09-18 – 2016-09-23 (×6): 10 mg via ORAL
  Filled 2016-09-17 (×6): qty 1

## 2016-09-17 MED ORDER — HYDROCODONE-ACETAMINOPHEN 5-325 MG PO TABS: 1.0000 | ORAL_TABLET | Freq: Four times a day (QID) | ORAL | 0 refills | Status: DC | PRN

## 2016-09-17 MED ORDER — SODIUM CHLORIDE 0.9% FLUSH
3.0000 mL | Freq: Two times a day (BID) | INTRAVENOUS | Status: DC
  Administered 2016-09-17 – 2016-09-22 (×7): 3 mL via INTRAVENOUS

## 2016-09-17 MED ORDER — DEXTROSE 5 % IV SOLN: 1.0000 g | INTRAVENOUS | Status: DC

## 2016-09-17 MED ORDER — SODIUM CHLORIDE 0.9 % IV SOLN
INTRAVENOUS | Status: DC
  Administered 2016-09-17 – 2016-09-22 (×8): via INTRAVENOUS

## 2016-09-17 MED ORDER — ZOLPIDEM TARTRATE 5 MG PO TABS
5.0000 mg | ORAL_TABLET | Freq: Every evening | ORAL | Status: DC | PRN
  Administered 2016-09-17 – 2016-09-22 (×5): 5 mg via ORAL
  Filled 2016-09-17 (×5): qty 1

## 2016-09-17 MED ORDER — SODIUM CHLORIDE 0.9 % IV BOLUS (SEPSIS)
500.0000 mL | Freq: Once | INTRAVENOUS | Status: AC
  Administered 2016-09-17: 500 mL via INTRAVENOUS

## 2016-09-17 MED ORDER — CARBIDOPA-LEVODOPA ER 25-100 MG PO TBCR
1.0000 | EXTENDED_RELEASE_TABLET | Freq: Three times a day (TID) | ORAL | Status: DC
  Administered 2016-09-17 – 2016-09-23 (×17): 1 via ORAL
  Filled 2016-09-17 (×20): qty 1

## 2016-09-17 MED ORDER — CARBIDOPA-LEVODOPA ER 23.75-95 MG PO CPCR: 1.0000 | ORAL_CAPSULE | Freq: Three times a day (TID) | ORAL | Status: DC

## 2016-09-17 MED ORDER — ALBUTEROL SULFATE (2.5 MG/3ML) 0.083% IN NEBU: 2.5000 mg | INHALATION_SOLUTION | Freq: Four times a day (QID) | RESPIRATORY_TRACT | Status: DC | PRN

## 2016-09-17 MED ORDER — LEVOTHYROXINE SODIUM 150 MCG PO TABS
150.0000 ug | ORAL_TABLET | Freq: Every day | ORAL | Status: DC
  Administered 2016-09-18 – 2016-09-23 (×6): 150 ug via ORAL
  Filled 2016-09-17 (×2): qty 1
  Filled 2016-09-17: qty 2
  Filled 2016-09-17: qty 1
  Filled 2016-09-17: qty 2
  Filled 2016-09-17: qty 1
  Filled 2016-09-17: qty 2
  Filled 2016-09-17 (×2): qty 1
  Filled 2016-09-17 (×2): qty 2

## 2016-09-17 MED ORDER — METHOCARBAMOL 500 MG PO TABS: 500.0000 mg | ORAL_TABLET | Freq: Four times a day (QID) | ORAL | Status: DC | PRN

## 2016-09-17 MED ORDER — IRBESARTAN 300 MG PO TABS
150.0000 mg | ORAL_TABLET | Freq: Every day | ORAL | Status: DC
  Administered 2016-09-18 – 2016-09-23 (×6): 150 mg via ORAL
  Filled 2016-09-17 (×6): qty 1

## 2016-09-17 MED ORDER — HYDROCODONE-ACETAMINOPHEN 5-325 MG PO TABS: 1.0000 | ORAL_TABLET | Freq: Four times a day (QID) | ORAL | Status: DC | PRN

## 2016-09-17 MED ORDER — TELMISARTAN-HCTZ 40-12.5 MG PO TABS: 1.0000 | ORAL_TABLET | Freq: Every day | ORAL | Status: DC

## 2016-09-17 MED ORDER — DEXTROSE 5 % IV SOLN
1.0000 g | Freq: Once | INTRAVENOUS | Status: AC
  Administered 2016-09-17: 1 g via INTRAVENOUS
  Filled 2016-09-17: qty 10

## 2016-09-17 MED ORDER — ONDANSETRON HCL 4 MG/2ML IJ SOLN: 4.0000 mg | Freq: Three times a day (TID) | INTRAMUSCULAR | Status: DC | PRN

## 2016-09-17 MED ORDER — CELECOXIB 100 MG PO CAPS
100.0000 mg | ORAL_CAPSULE | Freq: Two times a day (BID) | ORAL | Status: DC
  Administered 2016-09-17 – 2016-09-23 (×12): 100 mg via ORAL
  Filled 2016-09-17 (×13): qty 1

## 2016-09-17 MED ORDER — CEPHALEXIN 500 MG PO CAPS: 500.0000 mg | ORAL_CAPSULE | Freq: Two times a day (BID) | ORAL | 0 refills | Status: DC

## 2016-09-17 MED ORDER — DULOXETINE HCL 60 MG PO CPEP
60.0000 mg | ORAL_CAPSULE | Freq: Every day | ORAL | Status: DC
  Administered 2016-09-18 – 2016-09-23 (×6): 60 mg via ORAL
  Filled 2016-09-17 (×6): qty 1

## 2016-09-17 MED ORDER — HYDRALAZINE HCL 20 MG/ML IJ SOLN: 5.0000 mg | INTRAMUSCULAR | Status: DC | PRN

## 2016-09-17 MED ORDER — ATORVASTATIN CALCIUM 10 MG PO TABS
10.0000 mg | ORAL_TABLET | Freq: Every day | ORAL | Status: DC
  Administered 2016-09-18 – 2016-09-23 (×6): 10 mg via ORAL
  Filled 2016-09-17 (×6): qty 1

## 2016-09-17 MED ORDER — ACETAMINOPHEN 325 MG PO TABS
650.0000 mg | ORAL_TABLET | Freq: Four times a day (QID) | ORAL | Status: DC | PRN
  Administered 2016-09-20 – 2016-09-23 (×2): 650 mg via ORAL
  Filled 2016-09-17 (×2): qty 2

## 2016-09-17 NOTE — ED Provider Notes (Signed)
CSN: [id number]     Arrival date & time 09/17/16  1308 History   First MD Initiated Contact with Patient 09/17/16 1327     Chief Complaint  Patient presents with  . Foot Pain   (Consider location/radiation/quality/duration/timing/severity/associated sxs/prior Treatment) HPI  [name] [name] is a 71 y.o. female presenting to UC with daughter c/o Right foot pain that started earlier this week.  Pt appears to have mild dementia, she currently lives at [facility] and is seeing a neurologist for "multisystem atrophy" and a concussion per medical records.  Pt does not recall a specific injury to her foot but thinks she may have dropped something on it. Pain is aching and sore "12/10" in severity. She first noticed bruising close to her toes, then the other day she noticed a hot red nodule on the top of her foot, which is the most painful part of her foot. Daughter thinks that spot is larger today than it was yesterday.  Denies itching over the red nodule. Denies known insect bite.  She has tried ice w/o relief. She is on warfarin due to hx of PE so she cannot take ibuprofen.  She uses a walker for assistance ambulating. No other injuries.      Past Medical History:  Diagnosis Date  . Arthritis   . Chronic insomnia   . Complication of anesthesia    severe itching night of surgery requiring meds- also couldnt swallow- throat "paralyzed""  . Fibromyalgia   . Glaucoma   . Hypercholesterolemia   . Hyperlipidemia   . Hypertension    PCP Dr Cari Caraway- [place]  05/15/11 with clearance and note on chart,    chest x ray, EKG 12/12 EPIC, eccho 7/11 EPIC  . Hypothyroidism    s/p graves disease  . Kidney stone   . PD (Parkinson's disease) ([place])   . Peripheral vascular disease ([place])    PULMONARY EMBOLUS x 2- 2011, 2012/ FOLLOWED BY DR ODOGWU-LOV 12/12 EPIC   PT STAES WILL STOP COUMADIN 3/12 and begin LOVONOX 05/17/11 as previously instructed  . Sinus infection    at present- dr Honor Loh PA aware-  was seen there and at PCP 05/10/11  . Thyroid disease    Hypothyroidism   Past Surgical History:  Procedure Laterality Date  . ABDOMINAL HYSTERECTOMY    . APPENDECTOMY    . CATARACT EXTRACTION Bilateral   . CHOLECYSTECTOMY    . COLONOSCOPY    . EXPLORATORY LAPAROTOMY    . JOINT REPLACEMENT     left knee  7/12  . TOTAL KNEE ARTHROPLASTY  05/22/2011   Procedure: TOTAL KNEE ARTHROPLASTY;  Surgeon: Mauri Pole, MD;  Location: [facility];  Service: Orthopedics;  Laterality: Right;   Family History  Problem Relation Age of Onset  . Prostate cancer Father   . Stroke Father    Social History  Substance Use Topics  . Smoking status: Former Smoker    Packs/day: 1.00    Quit date: 05/10/1989  . Smokeless tobacco: Never Used  . Alcohol use 0.0 oz/week     Comment: one drink once a month   OB History    No data available     Review of Systems  Constitutional: Negative for chills and fever.  Musculoskeletal: Positive for arthralgias, gait problem, joint swelling and myalgias.  Skin: Positive for color change. Negative for wound.  Neurological: Negative for weakness and numbness.    Allergies  Crestor [rosuvastatin calcium]; Erythromycin; Gabapentin; Pregabalin; Carbidopa; Macrobid [nitrofurantoin]; and  Losartan  Home Medications   Prior to Admission medications   Medication Sig Start Date End Date Taking? Authorizing Provider  acetaminophen (TYLENOL) 500 MG tablet Take 500 mg by mouth every 6 (six) hours as needed for mild pain.    [provider]  albuterol (PROVENTIL HFA;VENTOLIN HFA) 108 (90 Base) MCG/ACT inhaler Inhale 2 puffs into the lungs every 6 (six) hours as needed for wheezing or shortness of breath. 04/07/16   Hoyt Koch, MD  amoxicillin-clavulanate (AUGMENTIN) 875-125 MG tablet Take 1 tablet by mouth 2 (two) times daily. 06/29/16   Saguier, Percell Miller, PA-C  atorvastatin (LIPITOR) 10 MG tablet Take 1 tablet (10 mg total) by mouth daily. 08/16/16   [name],  [name], MD  Carbidopa-Levodopa ER (RYTARY) 23.75-95 MG CPCR Take 1 tablet by mouth 3 (three) times daily. 09/07/16   Tat, Eustace Quail, DO  celecoxib (CELEBREX) 100 MG capsule Take 1 capsule (100 mg total) by mouth 2 (two) times daily. 07/09/16   [name], [name], MD  cephALEXin (KEFLEX) 500 MG capsule Take 1 capsule (500 mg total) by mouth 2 (two) times daily. 09/17/16   Noe Gens, PA-C  cetirizine (ZYRTEC) 10 MG tablet Take 1 tablet (10 mg total) by mouth daily. 04/17/16   Shelda Pal, DO  Cholecalciferol (VITAMIN D3) 5000 UNITS CAPS Take 5,000 Units by mouth daily.     [provider]  cyanocobalamin 500 MCG tablet Take 500 mcg by mouth daily.    [provider]  DULoxetine (CYMBALTA) 60 MG capsule Take 1 capsule (60 mg total) by mouth daily. 08/31/16   [name], [name], MD  fluticasone (FLONASE) 50 MCG/ACT nasal spray Place 1 spray into both nostrils daily.    [provider]  HYDROcodone-acetaminophen (NORCO/VICODIN) 5-325 MG tablet Take 1 tablet by mouth every 6 (six) hours as needed for moderate pain or severe pain. 09/17/16   Noe Gens, PA-C  magnesium oxide (MAG-OX) 400 (241.3 Mg) MG tablet Take 0.5 tablets (200 mg total) by mouth daily. 12/21/15   Robbie Lis, MD  Menthol, Topical Analgesic, (BIOFREEZE EX) Apply 1 application topically 4 (four) times daily. Apply to affected area.    [provider]  methocarbamol (ROBAXIN) 500 MG tablet Take 500 mg by mouth every 6 (six) hours as needed for muscle spasms.    [provider]  omega-3 acid ethyl esters (LOVAZA) 1 g capsule Take 1 g by mouth daily.     [provider]  ondansetron (ZOFRAN ODT) 4 MG disintegrating tablet Take 1 tablet (4 mg total) by mouth every 8 (eight) hours as needed for nausea or vomiting. 06/27/16   Saguier, Percell Miller, PA-C  Oxycodone HCl 10 MG TABS Take 1/2 tablet daily as needed for neck pain. If pain persists may take another 1/2 tablet per day  05/03/16   Jonetta Osgood, MD  potassium chloride SA (K-DUR,KLOR-CON) 20 MEQ tablet Take 1 tablet (20 mEq total) by mouth daily. 12/31/15   Angiulli, Lavon Paganini, PA-C  SYNTHROID 150 MCG tablet Take 1 tablet (150 mcg total) by mouth daily before breakfast. 07/09/16   [name], [name], MD  telmisartan-hydrochlorothiazide (MICARDIS HCT) 40-12.5 MG tablet Take 1 tablet by mouth daily. 07/09/16   [name], [name], MD  warfarin (COUMADIN) 2 MG tablet Take 2 mg by mouth See admin instructions. Take 2 mg only on  Saturday. 04/14/16   [provider]  warfarin (COUMADIN) 4 MG tablet Take 4 mg by mouth daily. 4 mg  every day except on Saturday. Saturday, patient takes 2 mg. 02/26/16   [provider]  zolpidem (AMBIEN) 5 MG tablet Take 1 tablet (5 mg total) by mouth at bedtime. Use ONLY WHEN NEEDED for sleep. Does not have to be used daily 06/27/16   Saguier, Percell Miller, PA-C   Meds Ordered and Administered this Visit  Medications - No data to display  BP 137/79 (BP Location: Left Arm)   Pulse (!) 104   Temp (!) 97.3 F (36.3 C) (Oral)   Resp 16   Ht 5\' 6"  (1.676 m)   Wt 190 lb (86.2 kg)   SpO2 96%   BMI 30.67 kg/m  No data found.   Physical Exam  Constitutional: She is oriented to person, place, and time. She appears well-developed and well-nourished.  HENT:  Head: Normocephalic and atraumatic.  Eyes: EOM are normal.  Neck: Normal range of motion.  Cardiovascular: Normal rate.   Pulmonary/Chest: Effort normal.  Musculoskeletal: Normal range of motion. She exhibits edema and tenderness.       Feet:  Right foot: full ROM, mild edema to entire foot. Quarter sized nodule to dorsal lateral aspect of proximal foot. Tender. tenderness to distal metatarsals with overlying bruising.  Ankle: full ROM, non-tender. Calf is soft, non-tender.  Neurological: She is alert and oriented to person, place, and time.  Skin: Skin is warm and dry. Capillary refill takes less than 2 seconds. There  is erythema.  Psychiatric: She has a normal mood and affect. Her behavior is normal.  Nursing note and vitals reviewed.   Urgent Care Course     Procedures (including critical care time)  Labs Review Labs Reviewed - No data to display  Imaging Review Dg Foot Complete Right  Result Date: 09/17/2016 CLINICAL DATA:  Pain and swelling in the right foot for 1 week. No reported injury. EXAM: RIGHT FOOT COMPLETE - 3+ VIEW COMPARISON:  None. FINDINGS: Mild diffuse right foot soft tissue swelling. No fracture or dislocation. No suspicious focal osseous lesions. No appreciable cortical erosions or periosteal reaction. Mild osteoarthritis in the distal interphalangeal joints of the third and fourth toes. Small plantar right calcaneal spur. No radiopaque foreign body. IMPRESSION: 1. Mild diffuse right foot soft tissue swelling. No fracture or malalignment. No specific radiographic findings of osteomyelitis. 2. Mild DIP joint osteoarthritis in the right third and fourth toes. 3. Small plantar right calcaneal spur. Electronically Signed   By: Ilona Sorrel M.D.   On: 09/17/2016 14:02    MDM   1. Contusion of right foot, initial encounter   2. Pain and swelling of toe of right foot    Pt c/o Right foot pain, bruising and now a red tender warm nodule over dorsal lateral aspect of foot.  Hx of clots. Pt appears to have mild dementia. Questionable compliance with medications per notes from Neurology on 09/07/16.    U/S venous Right LE ordered for [place] today. Unfortunately due to insurance reasons, U/S cannot be performed in outpatient setting prior to prior-authorization. Pt and daughter agree to go back tomorrow, 09/18/16 for U/S.  Will treat for potential underlying cellulitis and encouraged warm compresses with elevation until U/S tomorrow. Rx: cephalexin e-scribed to pt's pharmacy on file.       Noe Gens, [place] 09/17/16 [phone]

## 2016-09-17 NOTE — Progress Notes (Signed)
ANTICOAGULATION CONSULT NOTE - Initial Consult  Pharmacy Consult for Coumadin Indication: VTE treatment, h/o clots,  PE, on coumadin PTA  Allergies  Allergen Reactions  . Crestor [Rosuvastatin Calcium] Other (See Comments)    Feeling very bad, aching all over.  . Erythromycin Hives  . Gabapentin Other (See Comments)    Blurred vision  . Pregabalin Other (See Comments)    Crossed vision.  . Carbidopa Other (See Comments)    Headaches, nausea, dizziness  . Macrobid [Nitrofurantoin] Hives  . Losartan Other (See Comments)    Patient Measurements: Height: 5\' 6"  (167.6 cm) Weight: 190 lb (86.2 kg) IBW/kg (Calculated) : 59.3 Heparin Dosing Weight: Vital Signs: Temp: 100.5 F (38.1 C) (07/15 2044) Temp Source: Oral (07/15 2044) BP: 120/58 (07/15 2100) Pulse Rate: 109 (07/15 2100)  Labs:  Recent Labs  09/17/16 1954  HGB 14.1  HCT 42.1  PLT 273  CREATININE 0.83    Estimated Creatinine Clearance: 66.8 mL/min (by C-G formula based on SCr of 0.83 mg/dL).   Medical History: Past Medical History:  Diagnosis Date  . Arthritis   . Chronic insomnia   . Complication of anesthesia    severe itching night of surgery requiring meds- also couldnt swallow- throat "paralyzed""  . Fibromyalgia   . Glaucoma   . Hypercholesterolemia   . Hyperlipidemia   . Hypertension    PCP Dr Cari Caraway- [place]  05/15/11 with clearance and note on chart,    chest x ray, EKG 12/12 EPIC, eccho 7/11 EPIC  . Hypothyroidism    s/p graves disease  . Kidney stone   . PD (Parkinson's disease) ([place])   . Peripheral vascular disease ([place])    PULMONARY EMBOLUS x 2- 2011, 2012/ FOLLOWED BY DR ODOGWU-LOV 12/12 EPIC   PT STAES WILL STOP COUMADIN 3/12 and begin LOVONOX 05/17/11 as previously instructed  . Sinus infection    at present- dr Honor Loh PA aware- was seen there and at PCP 05/10/11  . Thyroid disease    Hypothyroidism    Assessment: 71 y.o female presents to ED on 09/17/16 with AMS.  Admitted for AMS,  SIRS and UTI.  Started on Rocephin.  Pharmacy consulted to dose Coumadin for VTE treatment.  Patient is on coumadin PTA for h/o "clots".  H/o PE noted.  Pt is in [facility] independent living and manages her own medications. The coumadin dose of 4mg  daily except 2mg  every Saturday-per pt's last MD visit on 09-07-16. Unknown last dose taken due to pt with AMS.  MD notes pt mild dementia. Questionable compliance with medications per notes from Neurology on 09/07/16.      Goal of Therapy:  INR 2-3 Monitor platelets by anticoagulation protocol: Yes   Plan:  F/u INR tonight then order coumadin dose. Daily PT/INR  Thank you for allowing pharmacy to be part of this patients care team. Nicole Cella, RPh Clinical Pharmacist Pager: [phone] 09/17/2016,9:59 PM

## 2016-09-17 NOTE — ED Notes (Signed)
Patient transported to CT 

## 2016-09-17 NOTE — H&P (Signed)
History and Physical    [name] [name] POE:[id number] DOB: 08/20/1945 DOA: 09/17/2016  Referring MD/NP/PA:   PCP: Darreld Mclean, MD   Patient coming from:  The patient is coming from [place].  At baseline, pt is partially dependent for most of ADL.  Chief Complaint: right foot pain, AMS and fall  HPI: [name] [name] is a 71 y.o. female with medical history significant of hypertension, hyperlipidemia, hypothyroidism, depression, PVD, Parkinson's disease fibromyalgia, hypercoagulable state, PE on Coumadin, mild dementia, who presents with right foot pain, altered mental status and fall.  Per her daughter, pt she may have dropped something on her right foot and injured her right foot last week. She developed pain, swelling and erythema in the dorsal side of right foot. There is hot red nodule on the top of her foot noted. The pain is constant, severe, nonradiating. Her daughter states that patient has been confused in the past for several days. She roll out of the bed and fell on Thursday, not sure if she had any injury. Patient has nausea, but no vomiting, diarrhea or abdominal pain. Patient does not have chest pain, cough, shortness of breath. Not sure if she has any symptoms of UTI. Patient moves all extremities normally.   She uses a walker for assistance ambulating.  ED Course: pt was found to have WBC 17.5, lactate 2.11, positive urinalysis with large amount of leukocytes and positive nitrite, creatinine normal, temperature 100.9, tachycardia, tachypnea, oxygen saturation 96% on room air, chest x-ray showed bronchitis take change. X-ray of right foot has no bony fracture. Patient is admitted to telemetry bed as inpatient.  Review of Systems: Could not be reviewed accurately due to altered mental status.  Allergy:  Allergies  Allergen Reactions  . Crestor [Rosuvastatin Calcium] Other (See Comments)    Feeling very bad, aching all over.  . Erythromycin Hives  . Gabapentin Other  (See Comments)    Blurred vision  . Pregabalin Other (See Comments)    Crossed vision.  . Carbidopa Other (See Comments)    Headaches, nausea, dizziness  . Macrobid [Nitrofurantoin] Hives  . Losartan Other (See Comments)    Past Medical History:  Diagnosis Date  . Arthritis   . Chronic insomnia   . Complication of anesthesia    severe itching night of surgery requiring meds- also couldnt swallow- throat "paralyzed""  . Fibromyalgia   . Glaucoma   . Hypercholesterolemia   . Hyperlipidemia   . Hypertension    PCP Dr Cari Caraway- [place]  05/15/11 with clearance and note on chart,    chest x ray, EKG 12/12 EPIC, eccho 7/11 EPIC  . Hypothyroidism    s/p graves disease  . Kidney stone   . PD (Parkinson's disease) ([place])   . Peripheral vascular disease ([place])    PULMONARY EMBOLUS x 2- 2011, 2012/ FOLLOWED BY DR ODOGWU-LOV 12/12 EPIC   PT STAES WILL STOP COUMADIN 3/12 and begin LOVONOX 05/17/11 as previously instructed  . Sinus infection    at present- dr Honor Loh PA aware- was seen there and at PCP 05/10/11  . Thyroid disease    Hypothyroidism    Past Surgical History:  Procedure Laterality Date  . ABDOMINAL HYSTERECTOMY    . APPENDECTOMY    . CATARACT EXTRACTION Bilateral   . CHOLECYSTECTOMY    . COLONOSCOPY    . EXPLORATORY LAPAROTOMY    . JOINT REPLACEMENT     left knee  7/12  . TOTAL KNEE ARTHROPLASTY  05/22/2011  Procedure: TOTAL KNEE ARTHROPLASTY;  Surgeon: Mauri Pole, MD;  Location: [facility];  Service: Orthopedics;  Laterality: Right;    Social History:  reports that she quit smoking about 27 years ago. She smoked 1.00 pack per day. She has never used smokeless tobacco. She reports that she drinks alcohol. She reports that she does not use drugs.  Family History:  Family History  Problem Relation Age of Onset  . Prostate cancer Father   . Stroke Father      Prior to Admission medications   Medication Sig Start Date End Date Taking? Authorizing Provider    acetaminophen (TYLENOL) 500 MG tablet Take 500 mg by mouth every 6 (six) hours as needed for mild pain.    [provider]  albuterol (PROVENTIL HFA;VENTOLIN HFA) 108 (90 Base) MCG/ACT inhaler Inhale 2 puffs into the lungs every 6 (six) hours as needed for wheezing or shortness of breath. 04/07/16   Hoyt Koch, MD  amoxicillin-clavulanate (AUGMENTIN) 875-125 MG tablet Take 1 tablet by mouth 2 (two) times daily. 06/29/16   Saguier, Percell Miller, PA-C  atorvastatin (LIPITOR) 10 MG tablet Take 1 tablet (10 mg total) by mouth daily. 08/16/16   [name], [name], MD  Carbidopa-Levodopa ER (RYTARY) 23.75-95 MG CPCR Take 1 tablet by mouth 3 (three) times daily. 09/07/16   Tat, Eustace Quail, DO  celecoxib (CELEBREX) 100 MG capsule Take 1 capsule (100 mg total) by mouth 2 (two) times daily. 07/09/16   [name], [name], MD  cephALEXin (KEFLEX) 500 MG capsule Take 1 capsule (500 mg total) by mouth 2 (two) times daily. 09/17/16   Noe Gens, PA-C  cetirizine (ZYRTEC) 10 MG tablet Take 1 tablet (10 mg total) by mouth daily. 04/17/16   Shelda Pal, DO  Cholecalciferol (VITAMIN D3) 5000 UNITS CAPS Take 5,000 Units by mouth daily.     [provider]  cyanocobalamin 500 MCG tablet Take 500 mcg by mouth daily.    [provider]  DULoxetine (CYMBALTA) 60 MG capsule Take 1 capsule (60 mg total) by mouth daily. 08/31/16   [name], [name], MD  fluticasone (FLONASE) 50 MCG/ACT nasal spray Place 1 spray into both nostrils daily.    [provider]  HYDROcodone-acetaminophen (NORCO/VICODIN) 5-325 MG tablet Take 1 tablet by mouth every 6 (six) hours as needed for moderate pain or severe pain. 09/17/16   Noe Gens, PA-C  magnesium oxide (MAG-OX) 400 (241.3 Mg) MG tablet Take 0.5 tablets (200 mg total) by mouth daily. 12/21/15   Robbie Lis, MD  Menthol, Topical Analgesic, (BIOFREEZE EX) Apply 1 application topically 4 (four) times daily. Apply to affected area.     [provider]  methocarbamol (ROBAXIN) 500 MG tablet Take 500 mg by mouth every 6 (six) hours as needed for muscle spasms.    [provider]  omega-3 acid ethyl esters (LOVAZA) 1 g capsule Take 1 g by mouth daily.     [provider]  ondansetron (ZOFRAN ODT) 4 MG disintegrating tablet Take 1 tablet (4 mg total) by mouth every 8 (eight) hours as needed for nausea or vomiting. 06/27/16   Saguier, Percell Miller, PA-C  Oxycodone HCl 10 MG TABS Take 1/2 tablet daily as needed for neck pain. If pain persists may take another 1/2 tablet per day 05/03/16   Jonetta Osgood, MD  potassium chloride SA (K-DUR,KLOR-CON) 20 MEQ tablet Take 1 tablet (20 mEq total) by mouth daily. 12/31/15   Cathlyn Parsons, PA-C  SYNTHROID 150 MCG tablet Take 1 tablet (150 mcg total) by mouth daily before breakfast. 07/09/16   [name], [name], MD  telmisartan-hydrochlorothiazide (MICARDIS HCT) 40-12.5 MG tablet Take 1 tablet by mouth daily. 07/09/16   [name], [name], MD  warfarin (COUMADIN) 2 MG tablet Take 2 mg by mouth See admin instructions. Take 2 mg only on  Saturday. 04/14/16   [provider]  warfarin (COUMADIN) 4 MG tablet Take 4 mg by mouth daily. 4 mg every day except on Saturday. Saturday, patient takes 2 mg. 02/26/16   [provider]  zolpidem (AMBIEN) 5 MG tablet Take 1 tablet (5 mg total) by mouth at bedtime. Use ONLY WHEN NEEDED for sleep. Does not have to be used daily 06/27/16   Mackie Pai, PA-C    Physical Exam: Vitals:   09/17/16 2000 09/17/16 2030 09/17/16 2044 09/17/16 2100  BP: (!) 139/56 112/72  (!) 120/58  Pulse: (!) 110 (!) 107  (!) 109  Resp: (!) 30 (!) 23  (!) 26  Temp:   (!) 100.5 F (38.1 C)   TempSrc:   Oral   SpO2: 96% 96%  96%  Weight:      Height:       General: Not in acute distress HEENT:       Eyes: PERRL, EOMI, no scleral icterus.       ENT: No discharge from the ears and nose, no pharynx injection, no tonsillar enlargement.         Neck: No JVD, no bruit, no mass felt. Heme: No neck lymph node enlargement. Cardiac: S1/S2, RRR, No murmurs, No gallops or rubs. Respiratory:  No rales, wheezing, rhonchi or rubs. GI: Soft, nondistended, nontender, no rebound pain, no organomegaly, BS present. GU: No hematuria Ext: 1+ pitting leg edema bilaterally. 2+DP/PT pulse bilaterally. There right foot is swelling, tender, erythematous, warm on the dorsal side. Musculoskeletal: No joint deformities, No joint redness or warmth, no limitation of ROM in spin. Skin: No rashes.  Neuro: confused, oriented tp place and person, but not to time, cranial nerves II-XII grossly intact, moves all extremities normally.  Psych: Patient is not psychotic, no suicidal or hemocidal ideation.  Labs on Admission: I have personally reviewed following labs and imaging studies  CBC:  Recent Labs Lab 09/17/16 1954  WBC 17.5*  NEUTROABS 14.4*  HGB 14.1  HCT 42.1  MCV 85.6  PLT 169   Basic Metabolic Panel:  Recent Labs Lab 09/17/16 1954  NA 133*  K 3.8  CL 99*  CO2 23  GLUCOSE 181*  BUN 11  CREATININE 0.83  CALCIUM 9.2   GFR: Estimated Creatinine Clearance: 66.8 mL/min (by C-G formula based on SCr of 0.83 mg/dL). Liver Function Tests:  Recent Labs Lab 09/17/16 1954  AST 32  ALT 26  ALKPHOS 120  BILITOT 1.2  PROT 6.9  ALBUMIN 3.5   No results for input(s): LIPASE, AMYLASE in the last 168 hours. No results for input(s): AMMONIA in the last 168 hours. Coagulation Profile: No results for input(s): INR, PROTIME in the last 168 hours. Cardiac Enzymes: No results for input(s): CKTOTAL, CKMB, CKMBINDEX, TROPONINI in the last 168 hours. BNP (last 3 results) No results for input(s): PROBNP in the last 8760 hours. HbA1C: No results for input(s): HGBA1C in the last 72 hours. CBG: No results for input(s): GLUCAP in the last 168 hours. Lipid Profile: No results for input(s): CHOL, HDL, LDLCALC, TRIG, CHOLHDL, LDLDIRECT in the last  72 hours. Thyroid Function Tests:  No results for input(s): TSH, T4TOTAL, FREET4, T3FREE, THYROIDAB in the last 72 hours. Anemia Panel: No results for input(s): VITAMINB12, FOLATE, FERRITIN, TIBC, IRON, RETICCTPCT in the last 72 hours. Urine analysis:    Component Value Date/Time   COLORURINE YELLOW 09/17/2016 1942   APPEARANCEUR CLOUDY (A) 09/17/2016 1942   LABSPEC 1.013 09/17/2016 1942   PHURINE 6.0 09/17/2016 1942   GLUCOSEU NEGATIVE 09/17/2016 1942   HGBUR MODERATE (A) 09/17/2016 1942   BILIRUBINUR NEGATIVE 09/17/2016 1942   BILIRUBINUR negative 06/27/2016 1222   KETONESUR NEGATIVE 09/17/2016 1942   PROTEINUR 100 (A) 09/17/2016 1942   UROBILINOGEN negative (A) 06/27/2016 1222   UROBILINOGEN 0.2 09/07/2012 1049   NITRITE POSITIVE (A) 09/17/2016 1942   LEUKOCYTESUR LARGE (A) 09/17/2016 1942   Sepsis Labs: '@LABRCNTIP' (procalcitonin:4,lacticidven:4) )No results found for this or any previous visit (from the past 240 hour(s)).   Radiological Exams on Admission: Ct Head Wo Contrast  Result Date: 09/17/2016 CLINICAL DATA:  Fall.  Confusion. EXAM: CT HEAD WITHOUT CONTRAST TECHNIQUE: Contiguous axial images were obtained from the base of the skull through the vertex without intravenous contrast. COMPARISON:  04/29/2016 head CT. FINDINGS: Brain: No evidence of parenchymal hemorrhage or extra-axial fluid collection. No mass lesion, mass effect, or midline shift. No CT evidence of acute infarction. Nonspecific moderate subcortical and periventricular white matter hypodensity, most in keeping with chronic small vessel ischemic change. Generalized cerebral volume loss. Cavum septum pellucidum et vergae. No ventriculomegaly. Vascular: No acute abnormality. Skull: No evidence of calvarial fracture. Sinuses/Orbits: No fluid levels. Mild mucoperiosteal thickening and partial opacification of the ethmoidal air cells. Other:  The mastoid air cells are unopacified. IMPRESSION: 1. No evidence of acute  intracranial abnormality. No evidence of calvarial fracture. 2. Generalized cerebral volume loss and moderate chronic small vessel ischemic change . Electronically Signed   By: Ilona Sorrel M.D.   On: 09/17/2016 21:53   Dg Chest Port 1 View  Result Date: 09/17/2016 CLINICAL DATA:  71 year old female with history of fever and altered level of consciousness. EXAM: PORTABLE CHEST 1 VIEW COMPARISON:  Chest x-ray 04/29/2016. FINDINGS: Lung volumes are low. Diffuse peribronchial cuffing and widespread interstitial prominence. No confluent consolidative airspace disease. No pleural effusions. No cephalization of the pulmonary vasculature. Heart size is upper limits of normal. The patient is rotated to the left on today's exam, resulting in distortion of the mediastinal contours and reduced diagnostic sensitivity and specificity for mediastinal pathology. Atherosclerosis in the thoracic aorta. IMPRESSION: 1. Diffuse peribronchial cuffing, concerning for an acute bronchitis. 2. Aortic atherosclerosis. Electronically Signed   By: Vinnie Langton M.D.   On: 09/17/2016 19:40   Dg Foot Complete Right  Result Date: 09/17/2016 CLINICAL DATA:  Pain and swelling in the right foot for 1 week. No reported injury. EXAM: RIGHT FOOT COMPLETE - 3+ VIEW COMPARISON:  None. FINDINGS: Mild diffuse right foot soft tissue swelling. No fracture or dislocation. No suspicious focal osseous lesions. No appreciable cortical erosions or periosteal reaction. Mild osteoarthritis in the distal interphalangeal joints of the third and fourth toes. Small plantar right calcaneal spur. No radiopaque foreign body. IMPRESSION: 1. Mild diffuse right foot soft tissue swelling. No fracture or malalignment. No specific radiographic findings of osteomyelitis. 2. Mild DIP joint osteoarthritis in the right third and fourth toes. 3. Small plantar right calcaneal spur. Electronically Signed   By: Ilona Sorrel M.D.   On: 09/17/2016 14:02     EKG:  Independently reviewed.  Sinus tachycardia, QTC 494, early R-wave progression. Assessment/Plan Principal  Problem:   Sepsis ([place]) Active Problems:   History of pulmonary embolism   Parkinson's disease ([place])   Benign essential HTN   UTI (urinary tract infection)   Hyperlipidemia   Hypothyroidism   Acute metabolic encephalopathy   Cellulitis of right foot   Sepsis due to UTI and right foot cellulitis: Patient meets criteria for sepsis with leukocytosis, elevated lactate, fever, tachycardia and tachypnea. Currently hemodynamically stable. - will admit to tele bed as inpt - Empiric antimicrobial treatment with IV rocephin - PRN norco for pain - Blood cultures x 2 and Urine culture - ESR and CRP - will get Procalcitonin and trend lactic acid levels per sepsis protocol. - IVF: 2.5 L of NS bolus in ED, followed by 75 cc/h  UTI:  -see above  Right foot cellulitis -see above  Fall: Unclear etiology, likely due to multifactorial etiology, particularly Parkinson's disease -Pt/or -get CT-head   History of pulmonary embolism: -continue coumadin per pharm  Parkinson's disease ([place]): -continue Rytary  HTN: -continue Micardis -IV hydralazine when necessary  HLD: -Lipitor  Hypothyroidism: Last TSH was on 1.071 on 05/03/15 -Continue home Synthroid  Acute metabolic encephalopathy: Likely due to sepsis and UTI -Treat underlying issues as above -Frequent neuro check  Depression: no suicidal or homicidal ideations. -Continue home medications: Cymbalta   DVT ppx: on Coumadin Code Status: DNR (I discussed with patient's daughter and explained the meaning of CODE STATUS. Per her daguhter, patient would want to be DNR). Family Communication:  Yes, patient's daughter and son-in law    at bed side Disposition Plan:  Anticipate discharge back to previous  Assisted Living Consults called:  none Admission status:  Inpatient/tele    Date of Service 09/17/2016    Ivor Costa [facility] Pager [phone]  If 7PM-7AM, please contact night-coverage [email or web address] Password [facility] 09/17/2016, 10:19 PM

## 2016-09-17 NOTE — ED Triage Notes (Signed)
Patient states that she thinks she dropped something on her right foot earlier in the week, but unsure, right foot has edema and redness, with bruising present across the toes. Patient rates pain 12/10 on pain scale. She also states that she rolled out of the bed on Thursday, but denies injury from the fall. Patient refused wheelchair and is ambulatory with a walker.

## 2016-09-17 NOTE — ED Notes (Signed)
The pt has equal grips   Sl confusion when following commands  Moves all four extremities.  She makes good eye contact.  She thinks its 1947 does not know day of week  The month or the president.  She gave me [birth year] as her birthday but not the day

## 2016-09-17 NOTE — ED Notes (Signed)
Pt transported to CT ?

## 2016-09-17 NOTE — ED Provider Notes (Signed)
[facility] DEPT Provider Note   CSN: [id number] Arrival date & time: 09/17/16  1920     History   Chief Complaint Chief Complaint  Patient presents with  . Altered Mental Status    HPI [name] [name] is a 71 y.o. female.  HPI  Patient presents from home with concern of fever, altered mental status, malodorous urine. The patient herself is awake, but offers only very brief verbal responses. EMS notes that the patient was seen earlier today at urgent care due to foot pain from a unspecified accident. Apparently after returning home the patient was found to be febrile, minimally interactive. The patient's daughter called 911. The patient listens to the provision of report on EMS arrival. The patient eventually states that she agrees with the retelling of the history provided by EMS providers, states that she has pain in her right foot, as well as pain all over, which is apparently normal for her with a history of chronic pain. Patient denies confusion, disorientation. EMS reports the patient was febrile in route, with a temperature of 101, with tachycardia, tachypnea, no hypotension  Past Medical History:  Diagnosis Date  . Arthritis   . Chronic insomnia   . Complication of anesthesia    severe itching night of surgery requiring meds- also couldnt swallow- throat "paralyzed""  . Fibromyalgia   . Glaucoma   . Hypercholesterolemia   . Hyperlipidemia   . Hypertension    PCP Dr Cari Caraway- [place]  05/15/11 with clearance and note on chart,    chest x ray, EKG 12/12 [facility], eccho 7/11 [facility]  . Hypothyroidism    s/p graves disease  . Kidney stone   . PD (Parkinson's disease) ([place])   . Peripheral vascular disease ([place])    PULMONARY EMBOLUS x 2- 2011, 2012/ FOLLOWED BY DR [id number] 12/12 [facility]   PT [facility]AES WILL STOP COUMADIN 3/12 and begin LOVONOX 05/17/11 as previously instructed  . Sinus infection    at present- dr Honor Loh PA aware- was seen there and at PCP 05/10/11  . Thyroid  disease    Hypothyroidism    Patient Active Problem List   Diagnosis Date Noted  . UTI (urinary tract infection) 04/29/2016  . SIRS (systemic inflammatory response syndrome) ([place]) 04/29/2016  . Hyperlipidemia 04/29/2016  . Hypothyroidism 04/29/2016  . Hematuria 04/05/2016  . Cough 04/05/2016  . Benign essential HTN   . Intractable pain 12/17/2015  . Displaced fracture of fifth cervical vertebra ([place]) 12/17/2015  . Gait disturbance 12/17/2015  . Trochanteric bursitis of both hips 09/13/2015  . Chronic low back pain 06/07/2015  . Fibromyalgia syndrome 05/17/2015  . Thyroid disease 05/17/2015  . Parkinson's disease ([place]) 05/17/2015  . Hypercoagulable state ([place]) 02/07/2012  . History of pulmonary embolism 02/07/2012  . S/P right TKA 05/22/2011    Past Surgical History:  Procedure Laterality Date  . ABDOMINAL HYSTERECTOMY    . APPENDECTOMY    . CATARACT EXTRACTION Bilateral   . CHOLECYSTECTOMY    . COLONOSCOPY    . EXPLORATORY LAPAROTOMY    . JOINT REPLACEMENT     left knee  7/12  . TOTAL KNEE ARTHROPLASTY  05/22/2011   Procedure: TOTAL KNEE ARTHROPLASTY;  Surgeon: Mauri Pole, MD;  Location: [facility];  Service: Orthopedics;  Laterality: Right;    OB History    No data available       Home Medications    Prior to Admission medications   Medication Sig Start Date End Date Taking? Authorizing Provider  acetaminophen (TYLENOL) 500 MG tablet Take 500 mg by mouth every 6 (six) hours as needed for mild pain.    [provider]  albuterol (PROVENTIL HFA;VENTOLIN HFA) 108 (90 Base) MCG/ACT inhaler Inhale 2 puffs into the lungs every 6 (six) hours as needed for wheezing or shortness of breath. 04/07/16   Hoyt Koch, MD  amoxicillin-clavulanate (AUGMENTIN) 875-125 MG tablet Take 1 tablet by mouth 2 (two) times daily. 06/29/16   Saguier, Percell Miller, PA-C  atorvastatin (LIPITOR) 10 MG tablet Take 1 tablet (10 mg total) by mouth daily. 08/16/16   [name], [name], MD   Carbidopa-Levodopa ER (RYTARY) 23.75-95 MG CPCR Take 1 tablet by mouth 3 (three) times daily. 09/07/16   Tat, Eustace Quail, DO  celecoxib (CELEBREX) 100 MG capsule Take 1 capsule (100 mg total) by mouth 2 (two) times daily. 07/09/16   [name], [name], MD  cephALEXin (KEFLEX) 500 MG capsule Take 1 capsule (500 mg total) by mouth 2 (two) times daily. 09/17/16   Noe Gens, PA-C  cetirizine (ZYRTEC) 10 MG tablet Take 1 tablet (10 mg total) by mouth daily. 04/17/16   Shelda Pal, DO  Cholecalciferol (VITAMIN D3) 5000 UNITS CAPS Take 5,000 Units by mouth daily.     [provider]  cyanocobalamin 500 MCG tablet Take 500 mcg by mouth daily.    [provider]  DULoxetine (CYMBALTA) 60 MG capsule Take 1 capsule (60 mg total) by mouth daily. 08/31/16   [name], [name], MD  fluticasone (FLONASE) 50 MCG/ACT nasal spray Place 1 spray into both nostrils daily.    [provider]  HYDROcodone-acetaminophen (NORCO/VICODIN) 5-325 MG tablet Take 1 tablet by mouth every 6 (six) hours as needed for moderate pain or severe pain. 09/17/16   Noe Gens, PA-C  magnesium oxide (MAG-OX) 400 (241.3 Mg) MG tablet Take 0.5 tablets (200 mg total) by mouth daily. 12/21/15   Robbie Lis, MD  Menthol, Topical Analgesic, (BIOFREEZE EX) Apply 1 application topically 4 (four) times daily. Apply to affected area.    [provider]  methocarbamol (ROBAXIN) 500 MG tablet Take 500 mg by mouth every 6 (six) hours as needed for muscle spasms.    [provider]  omega-3 acid ethyl esters (LOVAZA) 1 g capsule Take 1 g by mouth daily.     [provider]  ondansetron (ZOFRAN ODT) 4 MG disintegrating tablet Take 1 tablet (4 mg total) by mouth every 8 (eight) hours as needed for nausea or vomiting. 06/27/16   Saguier, Percell Miller, PA-C  Oxycodone HCl 10 MG TABS Take 1/2 tablet daily as needed for neck pain. If pain persists may take another 1/2 tablet per day 05/03/16   Jonetta Osgood, MD  potassium chloride SA (K-DUR,KLOR-CON) 20 MEQ tablet Take 1 tablet (20 mEq total) by mouth daily. 12/31/15   Angiulli, Lavon Paganini, PA-C  SYNTHROID 150 MCG tablet Take 1 tablet (150 mcg total) by mouth daily before breakfast. 07/09/16   [name], [name], MD  telmisartan-hydrochlorothiazide (MICARDIS HCT) 40-12.5 MG tablet Take 1 tablet by mouth daily. 07/09/16   [name], [name], MD  warfarin (COUMADIN) 2 MG tablet Take 2 mg by mouth See admin instructions. Take 2 mg only on  Saturday. 04/14/16   [provider]  warfarin (COUMADIN) 4 MG tablet Take 4 mg by mouth daily. 4 mg every day except on Saturday. Saturday, patient takes 2 mg. 02/26/16   [provider]  zolpidem (AMBIEN) 5 MG tablet  Take 1 tablet (5 mg total) by mouth at bedtime. Use ONLY WHEN NEEDED for sleep. Does not have to be used daily 06/27/16   Saguier, Percell Miller, PA-C    Family History Family History  Problem Relation Age of Onset  . Prostate cancer Father   . Stroke Father     Social History Social History  Substance Use Topics  . Smoking status: Former Smoker    Packs/day: 1.00    Quit date: 05/10/1989  . Smokeless tobacco: Never Used  . Alcohol use 0.0 oz/week     Comment: one drink once a month     Allergies   Crestor [rosuvastatin calcium]; Erythromycin; Gabapentin; Pregabalin; Carbidopa; Macrobid [nitrofurantoin]; and Losartan   Review of Systems Review of Systems  Unable to perform ROS: Mental status change     Physical Exam Updated Vital Signs BP 124/76 (BP Location: Right Arm)   Pulse (!) 116   Temp (!) 100.5 F (38.1 C) (Oral)   Resp (!) 32   Ht 5\' 6"  (1.676 m)   Wt 86.2 kg (190 lb)   SpO2 95%   BMI 30.67 kg/m   Physical Exam  Constitutional: She has a sickly appearance. No distress.  Withdrawn elderly female minimally communicative sitting upright  HENT:  Head: Normocephalic and atraumatic.  Eyes: Conjunctivae and EOM are normal.  Cardiovascular: Normal rate  and regular rhythm.   Pulmonary/Chest: Effort normal and breath sounds normal. No stridor. No respiratory distress.  Abdominal: She exhibits no distension.  Musculoskeletal: She exhibits no edema.       Feet:  Neurological: She displays atrophy. No cranial nerve deficit. She exhibits normal muscle tone. She displays no seizure activity.  Skin: Skin is warm and dry.  Psychiatric: She is slowed and withdrawn. Cognition and memory are impaired.  Nursing note and vitals reviewed.    ED Treatments / Results  Labs (all labs ordered are listed, but only abnormal results are displayed) Labs Reviewed  COMPREHENSIVE METABOLIC PANEL - Abnormal; Notable for the following:       Result Value   Sodium 133 (*)    Chloride 99 (*)    Glucose, Bld 181 (*)    All other components within normal limits  CBC WITH DIFFERENTIAL/PLATELET - Abnormal; Notable for the following:    WBC 17.5 (*)    Neutro Abs 14.4 (*)    Monocytes Absolute 1.8 (*)    All other components within normal limits  URINALYSIS, ROUTINE W REFLEX MICROSCOPIC - Abnormal; Notable for the following:    APPearance CLOUDY (*)    Hgb urine dipstick MODERATE (*)    Protein, ur 100 (*)    Nitrite POSITIVE (*)    Leukocytes, UA LARGE (*)    Bacteria, UA MANY (*)    All other components within normal limits  I-STAT CG4 LACTIC ACID, ED - Abnormal; Notable for the following:    Lactic Acid, Venous 2.11 (*)    All other components within normal limits  CULTURE, BLOOD (ROUTINE X 2)  CULTURE, BLOOD (ROUTINE X 2)    EKG  EKG Interpretation  Date/Time:  Sunday September 17 2016 19:24:56 EDT Ventricular Rate:  116 PR Interval:    QRS Duration: 112 QT Interval:  355 QTC Calculation: 494 R Axis:   81 Text Interpretation:  Sinus tachycardia Ventricular premature complex Artifact T wave abnormality Abnormal ekg Confirmed by Carmin Muskrat [phone]) on 09/17/2016 7:41:12 PM       Radiology Dg Foot Complete Right  Result Date:  09/17/2016 CLINICAL DATA:  Pain and swelling in the right foot for 1 week. No reported injury. EXAM: RIGHT FOOT COMPLETE - 3+ VIEW COMPARISON:  None. FINDINGS: Mild diffuse right foot soft tissue swelling. No fracture or dislocation. No suspicious focal osseous lesions. No appreciable cortical erosions or periosteal reaction. Mild osteoarthritis in the distal interphalangeal joints of the third and fourth toes. Small plantar right calcaneal spur. No radiopaque foreign body. IMPRESSION: 1. Mild diffuse right foot soft tissue swelling. No fracture or malalignment. No specific radiographic findings of osteomyelitis. 2. Mild DIP joint osteoarthritis in the right third and fourth toes. 3. Small plantar right calcaneal spur. Electronically Signed   By: Ilona Sorrel M.D.   On: 09/17/2016 14:02    Procedures Procedures (including critical care time)  Medications Ordered in ED Medications  cefTRIAXone (ROCEPHIN) 1 g in dextrose 5 % 50 mL IVPB (not administered)  sodium chloride 0.9 % bolus 500 mL (0 mLs Intravenous Stopped 09/17/16 2042)  acetaminophen (TYLENOL) tablet 650 mg (650 mg Oral Given 09/17/16 2005)     Initial Impression / Assessment and Plan / ED Course  I have reviewed the triage vital signs and the nursing notes.  Pertinent labs & imaging results that were available during my care of the patient were reviewed by me and considered in my medical decision making (see chart for details).  On repeat exam the patient is in similar condition. Mild tachycardia, no hypotension. Patient also has tachypnea, with multiple SIRS criteria No current evidence for shock, and with urinalysis demonstrating infection, I reviewed the patient's prior culture results, with sensitivity to ceftriaxone, should patient will receive this antibiotic.   Final Clinical Impressions(s) / ED Diagnoses  Altered mental status SIRS Urinary tract infection   Carmin Muskrat, MD 09/17/16 2045

## 2016-09-17 NOTE — ED Triage Notes (Signed)
The pt arrived by [facility] from [place] home.  The pt is confused and her urine has an odor to it.   Pt is alert skin hot and dry.  She is alert but not oriented  Temp elevated .

## 2016-09-17 NOTE — ED Notes (Signed)
EDP at bedside  

## 2016-09-17 NOTE — ED Notes (Signed)
Current temp elevated

## 2016-09-18 LAB — BASIC METABOLIC PANEL
ANION GAP: 7 (ref 5–15)
BUN: 11 mg/dL (ref 6–20)
CHLORIDE: 106 mmol/L (ref 101–111)
CO2: 26 mmol/L (ref 22–32)
Calcium: 8.7 mg/dL — ABNORMAL LOW (ref 8.9–10.3)
Creatinine, Ser: 0.72 mg/dL (ref 0.44–1.00)
Glucose, Bld: 144 mg/dL — ABNORMAL HIGH (ref 65–99)
Potassium: 3.4 mmol/L — ABNORMAL LOW (ref 3.5–5.1)
SODIUM: 139 mmol/L (ref 135–145)

## 2016-09-18 LAB — BLOOD CULTURE ID PANEL (REFLEXED)
ACINETOBACTER BAUMANNII: NOT DETECTED
CANDIDA ALBICANS: NOT DETECTED
CANDIDA KRUSEI: NOT DETECTED
CANDIDA PARAPSILOSIS: NOT DETECTED
CARBAPENEM RESISTANCE: NOT DETECTED
Candida glabrata: NOT DETECTED
Candida tropicalis: NOT DETECTED
ENTEROBACTER CLOACAE COMPLEX: NOT DETECTED
ENTEROBACTERIACEAE SPECIES: DETECTED — AB
Enterococcus species: NOT DETECTED
Escherichia coli: DETECTED — AB
Haemophilus influenzae: NOT DETECTED
KLEBSIELLA OXYTOCA: NOT DETECTED
KLEBSIELLA PNEUMONIAE: NOT DETECTED
Listeria monocytogenes: NOT DETECTED
Neisseria meningitidis: NOT DETECTED
PROTEUS SPECIES: NOT DETECTED
PSEUDOMONAS AERUGINOSA: NOT DETECTED
STREPTOCOCCUS PYOGENES: NOT DETECTED
Serratia marcescens: NOT DETECTED
Staphylococcus aureus (BCID): NOT DETECTED
Staphylococcus species: NOT DETECTED
Streptococcus agalactiae: NOT DETECTED
Streptococcus pneumoniae: NOT DETECTED
Streptococcus species: NOT DETECTED

## 2016-09-18 LAB — BRAIN NATRIURETIC PEPTIDE: B NATRIURETIC PEPTIDE 5: 40.1 pg/mL (ref 0.0–100.0)

## 2016-09-18 LAB — CBC
HEMATOCRIT: 40.2 % (ref 36.0–46.0)
Hemoglobin: 13.1 g/dL (ref 12.0–15.0)
MCH: 28.1 pg (ref 26.0–34.0)
MCHC: 32.6 g/dL (ref 30.0–36.0)
MCV: 86.3 fL (ref 78.0–100.0)
Platelets: 271 10*3/uL (ref 150–400)
RBC: 4.66 MIL/uL (ref 3.87–5.11)
RDW: 13.1 % (ref 11.5–15.5)
WBC: 18.6 10*3/uL — AB (ref 4.0–10.5)

## 2016-09-18 LAB — MRSA PCR SCREENING: MRSA BY PCR: NEGATIVE

## 2016-09-18 LAB — PROTIME-INR
INR: 2.83
INR: 2.59
PROTHROMBIN TIME: 30.4 s — AB (ref 11.4–15.2)
Prothrombin Time: 28.2 seconds — ABNORMAL HIGH (ref 11.4–15.2)

## 2016-09-18 LAB — GLUCOSE, CAPILLARY: Glucose-Capillary: 130 mg/dL — ABNORMAL HIGH (ref 65–99)

## 2016-09-18 LAB — LACTIC ACID, PLASMA
Lactic Acid, Venous: 2.2 mmol/L (ref 0.5–1.9)
Lactic Acid, Venous: 1.2 mmol/L (ref 0.5–1.9)

## 2016-09-18 LAB — MAGNESIUM: Magnesium: 1.9 mg/dL (ref 1.7–2.4)

## 2016-09-18 LAB — PROCALCITONIN: PROCALCITONIN: 0.52 ng/mL

## 2016-09-18 LAB — SEDIMENTATION RATE: Sed Rate: 20 mm/hr (ref 0–22)

## 2016-09-18 LAB — C-REACTIVE PROTEIN: CRP: 5 mg/dL — ABNORMAL HIGH (ref <1.0)

## 2016-09-18 MED ORDER — SODIUM CHLORIDE 0.9 % IV SOLN
1.0000 g | Freq: Three times a day (TID) | INTRAVENOUS | Status: AC
  Administered 2016-09-18 – 2016-09-23 (×15): 1 g via INTRAVENOUS
  Filled 2016-09-18 (×15): qty 1

## 2016-09-18 MED ORDER — WARFARIN SODIUM 2 MG PO TABS
2.0000 mg | ORAL_TABLET | Freq: Once | ORAL | Status: AC
  Administered 2016-09-18: 2 mg via ORAL
  Filled 2016-09-18: qty 1

## 2016-09-18 MED ORDER — SODIUM CHLORIDE 0.9 % IV BOLUS (SEPSIS)
500.0000 mL | Freq: Once | INTRAVENOUS | Status: AC
  Administered 2016-09-18: 500 mL via INTRAVENOUS

## 2016-09-18 MED ORDER — POTASSIUM CHLORIDE CRYS ER 20 MEQ PO TBCR
40.0000 meq | EXTENDED_RELEASE_TABLET | ORAL | Status: AC
  Administered 2016-09-18 (×2): 40 meq via ORAL
  Filled 2016-09-18 (×2): qty 2

## 2016-09-18 MED ORDER — WARFARIN - PHARMACIST DOSING INPATIENT: Freq: Every day | Status: DC

## 2016-09-18 NOTE — Progress Notes (Signed)
PT AMS. Daughter requests all four side rails be up.

## 2016-09-18 NOTE — Consult Note (Signed)
   [facility] CM Inpatient Consult   09/18/2016  [name] [name] [birth year] [id number]    Patient screened for potential [facility] Care Management services. Chart reviewed. Noted [name] [name] is from [place].   Went to bedside to assess if [facility] Care Management is needed. However, [name] [name] was sleeping soundly. Will follow up at later time.  Made inpatient RNCM aware.  Marthenia Rolling, MSN-Ed, RN,BSN [facility] Liaison [phone]

## 2016-09-18 NOTE — Progress Notes (Deleted)
Pt.  AMS. Pt daughter requests all four side rail be up.

## 2016-09-18 NOTE — Progress Notes (Signed)
PROGRESS NOTE    Zaryah Seckel   LSL:[id number]  DOB: [birth year]  DOA: 09/17/2016 PCP: Darreld Mclean, MD   Brief Narrative:  Raha Tennison is a 71 y.o. female who lives in independent living and has a h/o HTN, HLD, Parkinson's disease, fibromyalgia, hypercoagulable state, PE on Coumadin, PVD, depression who is brought in her her daughter for right foot bruising, redness and swelling. She does not remember injuring it. The toes are bruised and there is a raised red area about 2 '' in diameter on the dorsum of the foot which is tender. She has been walking on it but it is painful.  She is also brought in for confusion and fever and is found to have a UTI. He has frequent UTIs.    Subjective: Alert. Oriented enough to know that she is in the hospital and was brought by her daughter. No complaints. Urinates in the female urine collecting system while I am in the room and tells me there is not dsyruia. No abdominal pain. Foot is not hurting at the moment.  ROS: no complaints of nausea, vomiting, constipation diarrhea, cough, dyspnea or dysuria. No other complaints.   Assessment & Plan:   Principal Problem:   Severe Sepsis due to E coli UTI and bactermia - has had E coli in the past and it was ESBL in Feb of this year - Blood cultures growing E coli- change Rocephin to Imipenem   Active Problems:   Acute metabolic encephalopathy - due to above, resolving  Injury of right foot - no fracture noted on imaging- does not appear to be cellulitis will apply ice and elevate  Hypokalemia - replacing    History of pulmonary embolism/ hypercoagulable state - cont Coumadin    Parkinson's disease  - Sinemet    Benign essential HTN - Avapro    Hyperlipidemia - Lipitor    Hypothyroidism - Synthroid  Depression - Cymbalta  B12 deficiency - on B12 supplement  DVT prophylaxis: Warfarin Code Status: DNR Family Communication:  Disposition Plan: PT eval to determine  disposition Consultants:    Procedures:    Antimicrobials:  Anti-infectives    Start     Dose/Rate Route Frequency Ordered Stop   09/18/16 2000  cefTRIAXone (ROCEPHIN) 1 g in dextrose 5 % 50 mL IVPB  Status:  Discontinued     1 g 100 mL/hr over 30 Minutes Intravenous Every 24 hours 09/17/16 2116 09/18/16 1042   09/18/16 1445  meropenem (MERREM) 1 g in sodium chloride 0.9 % 100 mL IVPB     1 g 200 mL/hr over 30 Minutes Intravenous Every 8 hours 09/18/16 1432     09/17/16 2045  cefTRIAXone (ROCEPHIN) 1 g in dextrose 5 % 50 mL IVPB     1 g 100 mL/hr over 30 Minutes Intravenous  Once 09/17/16 2044 09/17/16 2121       Objective: Vitals:   09/17/16 2044 09/17/16 2100 09/17/16 2231 09/18/16 0629  BP:  (!) 120/58 118/67 126/60  Pulse:  (!) 109 (!) 104 88  Resp:  (!) 26 19 18   Temp: (!) 100.5 F (38.1 C)  99.3 F (37.4 C) 98.2 F (36.8 C)  TempSrc: Oral  Oral Oral  SpO2:  96% 100% 95%  Weight:      Height:        Intake/Output Summary (Last 24 hours) at 09/18/16 1511 Last data filed at 09/18/16 1322  Gross per 24 hour  Intake  1067.5 ml  Output             1175 ml  Net           -107.5 ml   Filed Weights   09/17/16 1925  Weight: 86.2 kg (190 lb)    Examination: General exam: Appears comfortable  HEENT: PERRLA, oral mucosa moist, no sclera icterus or thrush Respiratory system: Clear to auscultation. Respiratory effort normal. Cardiovascular system: S1 & S2 heard, RRR.  No murmurs  Gastrointestinal system: Abdomen soft, non-tender, nondistended. Normal bowel sound. No organomegaly Central nervous system: Alert and oriented. No focal neurological deficits. Extremities: No cyanosis, clubbing - mild edema of right foot Skin: No rashes or ulcers- bruising on right toes with erythematosus tender area on dorsum about 2'' in diameter. Psychiatry:  Mood & affect appropriate.     Data Reviewed: I have personally reviewed following labs and imaging  studies  CBC:  Recent Labs Lab 09/17/16 1954 09/18/16 0607  WBC 17.5* 18.6*  NEUTROABS 14.4*  --   HGB 14.1 13.1  HCT 42.1 40.2  MCV 85.6 86.3  PLT 273 035   Basic Metabolic Panel:  Recent Labs Lab 09/17/16 1954 09/18/16 0607 09/18/16 0900  NA 133* 139  --   K 3.8 3.4*  --   CL 99* 106  --   CO2 23 26  --   GLUCOSE 181* 144*  --   BUN 11 11  --   CREATININE 0.83 0.72  --   CALCIUM 9.2 8.7*  --   MG  --   --  1.9   GFR: Estimated Creatinine Clearance: 69.3 mL/min (by C-G formula based on SCr of 0.72 mg/dL). Liver Function Tests:  Recent Labs Lab 09/17/16 1954  AST 32  ALT 26  ALKPHOS 120  BILITOT 1.2  PROT 6.9  ALBUMIN 3.5   No results for input(s): LIPASE, AMYLASE in the last 168 hours. No results for input(s): AMMONIA in the last 168 hours. Coagulation Profile:  Recent Labs Lab 09/17/16 2342 09/18/16 0607  INR 2.83 2.59   Cardiac Enzymes: No results for input(s): CKTOTAL, CKMB, CKMBINDEX, TROPONINI in the last 168 hours. BNP (last 3 results) No results for input(s): PROBNP in the last 8760 hours. HbA1C: No results for input(s): HGBA1C in the last 72 hours. CBG:  Recent Labs Lab 09/18/16 0825  GLUCAP 130*   Lipid Profile: No results for input(s): CHOL, HDL, LDLCALC, TRIG, CHOLHDL, LDLDIRECT in the last 72 hours. Thyroid Function Tests: No results for input(s): TSH, T4TOTAL, FREET4, T3FREE, THYROIDAB in the last 72 hours. Anemia Panel: No results for input(s): VITAMINB12, FOLATE, FERRITIN, TIBC, IRON, RETICCTPCT in the last 72 hours. Urine analysis:    Component Value Date/Time   COLORURINE YELLOW 09/17/2016 1942   APPEARANCEUR CLOUDY (A) 09/17/2016 1942   LABSPEC 1.013 09/17/2016 1942   PHURINE 6.0 09/17/2016 1942   GLUCOSEU NEGATIVE 09/17/2016 1942   HGBUR MODERATE (A) 09/17/2016 1942   BILIRUBINUR NEGATIVE 09/17/2016 1942   BILIRUBINUR negative 06/27/2016 1222   KETONESUR NEGATIVE 09/17/2016 1942   PROTEINUR 100 (A) 09/17/2016  1942   UROBILINOGEN negative (A) 06/27/2016 1222   UROBILINOGEN 0.2 09/07/2012 1049   NITRITE POSITIVE (A) 09/17/2016 1942   LEUKOCYTESUR LARGE (A) 09/17/2016 1942   Sepsis Labs: @LABRCNTIP (procalcitonin:4,lacticidven:4) ) Recent Results (from the past 240 hour(s))  Blood Culture (routine x 2)     Status: None (Preliminary result)   Collection Time: 09/17/16  7:48 PM  Result Value Ref Range Status  Specimen Description BLOOD RIGHT ANTECUBITAL  Final   Special Requests   Final    BOTTLES DRAWN AEROBIC AND ANAEROBIC Blood Culture adequate volume   Culture  Setup Time   Final    GRAM NEGATIVE RODS IN BOTH AEROBIC AND ANAEROBIC BOTTLES CRITICAL RESULT CALLED TO, READ BACK BY AND VERIFIED WITH: E MARTIN,PHARMD AT 6962 09/18/16 BY L BENFIELD    Culture GRAM NEGATIVE RODS  Final   Report Status PENDING  Incomplete  Blood Culture ID Panel (Reflexed)     Status: Abnormal   Collection Time: 09/17/16  7:48 PM  Result Value Ref Range Status   Enterococcus species NOT DETECTED NOT DETECTED Final   Listeria monocytogenes NOT DETECTED NOT DETECTED Final   Staphylococcus species NOT DETECTED NOT DETECTED Final   Staphylococcus aureus NOT DETECTED NOT DETECTED Final   Streptococcus species NOT DETECTED NOT DETECTED Final   Streptococcus agalactiae NOT DETECTED NOT DETECTED Final   Streptococcus pneumoniae NOT DETECTED NOT DETECTED Final   Streptococcus pyogenes NOT DETECTED NOT DETECTED Final   Acinetobacter baumannii NOT DETECTED NOT DETECTED Final   Enterobacteriaceae species DETECTED (A) NOT DETECTED Final    Comment: Enterobacteriaceae represent a large family of gram-negative bacteria, not a single organism. CRITICAL RESULT CALLED TO, READ BACK BY AND VERIFIED WITH: E MARTIN,PHARMD AT 1035 09/18/16 BY L BENFIELD    Enterobacter cloacae complex NOT DETECTED NOT DETECTED Final   Escherichia coli DETECTED (A) NOT DETECTED Final    Comment: CRITICAL RESULT CALLED TO, READ BACK BY AND  VERIFIED WITH: E MARTIN,PHARMD AT 1035 09/18/16 BY L BENFIELD    Klebsiella oxytoca NOT DETECTED NOT DETECTED Final   Klebsiella pneumoniae NOT DETECTED NOT DETECTED Final   Proteus species NOT DETECTED NOT DETECTED Final   Serratia marcescens NOT DETECTED NOT DETECTED Final   Carbapenem resistance NOT DETECTED NOT DETECTED Final   Haemophilus influenzae NOT DETECTED NOT DETECTED Final   Neisseria meningitidis NOT DETECTED NOT DETECTED Final   Pseudomonas aeruginosa NOT DETECTED NOT DETECTED Final   Candida albicans NOT DETECTED NOT DETECTED Final   Candida glabrata NOT DETECTED NOT DETECTED Final   Candida krusei NOT DETECTED NOT DETECTED Final   Candida parapsilosis NOT DETECTED NOT DETECTED Final   Candida tropicalis NOT DETECTED NOT DETECTED Final  Blood Culture (routine x 2)     Status: None (Preliminary result)   Collection Time: 09/17/16  7:54 PM  Result Value Ref Range Status   Specimen Description BLOOD RIGHT ARM  Final   Special Requests   Final    BOTTLES DRAWN AEROBIC AND ANAEROBIC Blood Culture adequate volume   Culture NO GROWTH < 24 HOURS  Final   Report Status PENDING  Incomplete  MRSA PCR Screening     Status: None   Collection Time: 09/18/16  7:59 AM  Result Value Ref Range Status   MRSA by PCR NEGATIVE NEGATIVE Final    Comment:        The GeneXpert MRSA Assay (FDA approved for NASAL specimens only), is one component of a comprehensive MRSA colonization surveillance program. It is not intended to diagnose MRSA infection nor to guide or monitor treatment for MRSA infections.          Radiology Studies: Ct Head Wo Contrast  Result Date: 09/17/2016 CLINICAL DATA:  Fall.  Confusion. EXAM: CT HEAD WITHOUT CONTRAST TECHNIQUE: Contiguous axial images were obtained from the base of the skull through the vertex without intravenous contrast. COMPARISON:  04/29/2016 head CT. FINDINGS: Brain:  No evidence of parenchymal hemorrhage or extra-axial fluid collection.  No mass lesion, mass effect, or midline shift. No CT evidence of acute infarction. Nonspecific moderate subcortical and periventricular white matter hypodensity, most in keeping with chronic small vessel ischemic change. Generalized cerebral volume loss. Cavum septum pellucidum et vergae. No ventriculomegaly. Vascular: No acute abnormality. Skull: No evidence of calvarial fracture. Sinuses/Orbits: No fluid levels. Mild mucoperiosteal thickening and partial opacification of the ethmoidal air cells. Other:  The mastoid air cells are unopacified. IMPRESSION: 1. No evidence of acute intracranial abnormality. No evidence of calvarial fracture. 2. Generalized cerebral volume loss and moderate chronic small vessel ischemic change . Electronically Signed   By: Ilona Sorrel M.D.   On: 09/17/2016 21:53   Dg Chest Port 1 View  Result Date: 09/17/2016 CLINICAL DATA:  71 year old female with history of fever and altered level of consciousness. EXAM: PORTABLE CHEST 1 VIEW COMPARISON:  Chest x-ray 04/29/2016. FINDINGS: Lung volumes are low. Diffuse peribronchial cuffing and widespread interstitial prominence. No confluent consolidative airspace disease. No pleural effusions. No cephalization of the pulmonary vasculature. Heart size is upper limits of normal. The patient is rotated to the left on today's exam, resulting in distortion of the mediastinal contours and reduced diagnostic sensitivity and specificity for mediastinal pathology. Atherosclerosis in the thoracic aorta. IMPRESSION: 1. Diffuse peribronchial cuffing, concerning for an acute bronchitis. 2. Aortic atherosclerosis. Electronically Signed   By: Vinnie Langton M.D.   On: 09/17/2016 19:40   Dg Foot Complete Right  Result Date: 09/17/2016 CLINICAL DATA:  Pain and swelling in the right foot for 1 week. No reported injury. EXAM: RIGHT FOOT COMPLETE - 3+ VIEW COMPARISON:  None. FINDINGS: Mild diffuse right foot soft tissue swelling. No fracture or dislocation.  No suspicious focal osseous lesions. No appreciable cortical erosions or periosteal reaction. Mild osteoarthritis in the distal interphalangeal joints of the third and fourth toes. Small plantar right calcaneal spur. No radiopaque foreign body. IMPRESSION: 1. Mild diffuse right foot soft tissue swelling. No fracture or malalignment. No specific radiographic findings of osteomyelitis. 2. Mild DIP joint osteoarthritis in the right third and fourth toes. 3. Small plantar right calcaneal spur. Electronically Signed   By: Ilona Sorrel M.D.   On: 09/17/2016 14:02      Scheduled Meds: . atorvastatin  10 mg Oral Daily  . Carbidopa-Levodopa ER  1 tablet Oral TID  . celecoxib  100 mg Oral BID  . cholecalciferol  5,000 Units Oral Daily  . cyanocobalamin  500 mcg Oral Daily  . DULoxetine  60 mg Oral Daily  . fluticasone  1 spray Each Nare Daily  . irbesartan  150 mg Oral Daily  . levothyroxine  150 mcg Oral QAC breakfast  . loratadine  10 mg Oral Daily  . magnesium oxide  200 mg Oral Daily  . omega-3 acid ethyl esters  1 g Oral Daily  . sodium chloride flush  3 mL Intravenous Q12H  . Warfarin - Pharmacist Dosing Inpatient   Does not apply q1800   Continuous Infusions: . sodium chloride 75 mL/hr at 09/18/16 0505  . meropenem (MERREM) IV    . sodium chloride       LOS: 1 day    Time spent in minutes: 35    Debbe Odea, MD [facility] Pager: [email or web address] Password [email or web address] 09/18/2016, 3:11 PM

## 2016-09-18 NOTE — Evaluation (Signed)
Occupational Therapy Evaluation Patient Details Name: [name] [name] MRN: [id number] DOB: 1945-11-23 Today's Date: 09/18/2016    History of Present Illness [name] [name] is a 71 y.o. female with medical history significant of hypertension, hyperlipidemia, hypothyroidism, depression, PVD, Parkinson's disease fibromyalgia, hypercoagulable state, PE on Coumadin, mild dementia, who presents with right foot pain, altered mental status and fall. found to have sepsis related to UTI and R foot cellulitits   Clinical Impression   PT admitted with cellulitis R LE. Pt currently with functional limitiations due to the deficits listed below (see OT problem list). PTA was at independent living and must be independent to return at this time. Pt currently min (A) for adls. Pt should progress with decr edema and pain in R LE.  Pt will benefit from skilled OT to increase their independence and safety with adls and balance to allow discharge [place].     Follow Up Recommendations  Home health OT;Supervision - Intermittent    Equipment Recommendations  None recommended by OT    Recommendations for Other Services       Precautions / Restrictions Precautions Precautions: Fall Precaution Comments: Fall risk is greatly reduced with use of RW Restrictions Weight Bearing Restrictions: No      Mobility Bed Mobility Overal bed mobility: Needs Assistance Bed Mobility: Supine to Sit     Supine to sit: Supervision     General bed mobility comments: Supervision mostly for lines and safety  Transfers Overall transfer level: Needs assistance Equipment used: Rolling walker (2 wheeled) Transfers: Sit to/from [facility] Sit to Stand: Min assist Stand pivot transfers: Min assist       General transfer comment: Min assit to steady; multimodal cues for initiation and coordination of tasks; decr control of descent to sit    Balance Overall balance assessment: Needs assistance   Sitting  balance-Leahy Scale: Good       Standing balance-Leahy Scale: Poor                             ADL either performed or assessed with clinical judgement   ADL Overall ADL's : Needs assistance/impaired Eating/Feeding: Independent Eating/Feeding Details (indicate cue type and reason): pt needed cues to locate water on table but reports "i am thirsty" pt needed cue to initiate task Grooming: Wash/dry hands;Supervision/safety               Lower Body Dressing: Min guard   Toilet Transfer: Minimal assistance   Toileting- Clothing Manipulation and Hygiene: Minimal assistance Toileting - Clothing Manipulation Details (indicate cue type and reason): pt able to static stand for peri care. pt does report foot pain     Functional mobility during ADLs: Minimal assistance General ADL Comments: pt needed redirection throughtout session     Vision         Perception     Praxis      Pertinent Vitals/Pain Pain Assessment: Faces Faces Pain Scale: Hurts a little bit Pain Location: R foot Pain Descriptors / Indicators: Aching Pain Intervention(s): Repositioned;Premedicated before session;Monitored during session;Limited activity within patient's tolerance     Hand Dominance Right   Extremity/Trunk Assessment Upper Extremity Assessment Upper Extremity Assessment: Overall WFL for tasks assessed   Lower Extremity Assessment Lower Extremity Assessment: Defer to PT evaluation RLE Deficits / Details: Noted red, warm area of swelling, knot-like, on dorsum lateral aspect of foot; also some bruising at base of toes   Cervical / Trunk Assessment  Cervical / Trunk Assessment: Normal   Communication Communication Communication: No difficulties   Cognition Arousal/Alertness: Awake/alert Behavior During Therapy: WFL for tasks assessed/performed Overall Cognitive Status: Impaired/Different from baseline Area of Impairment: Attention;Safety/judgement;Awareness;Problem  solving                   Current Attention Level: Sustained (easily distractable)     Safety/Judgement: Decreased awareness of safety;Decreased awareness of deficits   Problem Solving: Slow processing;Difficulty sequencing;Requires verbal cues;Requires tactile cues General Comments: pt no recall of reason for admission. pt unaware of current location. pt with recall of son in law OT "Mabeline Caras" here at [facility] in inpatient rehab. Pt able to recall information about recent events.   General Comments  redness R foot and lack of awareness until weight bearing on LE    Exercises     Shoulder Instructions      Home Living Family/patient expects to be discharged to:: Other (Comment) (Independent living apartment)                                 Additional Comments: pt must be independent to return to PLOF      Prior Functioning/Environment Level of Independence: Independent with assistive device(s)        Comments: Reports she walks to dining hall with RW and manages ADLs independently        OT Problem List: Decreased strength;Decreased activity tolerance;Impaired balance (sitting and/or standing);Decreased cognition;Decreased safety awareness;Decreased knowledge of use of DME or AE;Decreased knowledge of precautions;Pain      OT Treatment/Interventions: Self-care/ADL training;Therapeutic exercise;DME and/or AE instruction;Therapeutic activities;Cognitive remediation/compensation;Patient/family education;Balance training    OT Goals(Current goals can be found in the care plan section) Acute Rehab OT Goals Patient Stated Goal: get better and get home soon OT Goal Formulation: Patient unable to participate in goal setting Time For Goal Achievement: 10/02/16 Potential to Achieve Goals: Good  OT Frequency: Min 3X/week   Barriers to D/C:            Co-evaluation              AM-PAC PT "6 Clicks" Daily Activity     Outcome Measure Help from  another person eating meals?: A Little Help from another person taking care of personal grooming?: A Little Help from another person toileting, which includes using toliet, bedpan, or urinal?: A Little Help from another person bathing (including washing, rinsing, drying)?: A Little Help from another person to put on and taking off regular upper body clothing?: A Little Help from another person to put on and taking off regular lower body clothing?: A Little 6 Click Score: 18   End of Session Equipment Utilized During Treatment: Gait belt;Rolling walker Nurse Communication: Mobility status;Precautions  Activity Tolerance: Patient tolerated treatment well Patient left: in chair;with call bell/phone within reach;with chair alarm set  OT Visit Diagnosis: Unsteadiness on feet (R26.81)                Time: 2774-1287 OT Time Calculation (min): 21 min Charges:  OT General Charges $OT Visit: 1 Procedure OT Evaluation $OT Eval Moderate Complexity: 1 Procedure G-Codes:      Jeri Modena   OTR/L Pager: [phone] Office: [phone] .   Parke Poisson B 09/18/2016, 1:30 PM

## 2016-09-18 NOTE — Progress Notes (Signed)
  PHARMACY - PHYSICIAN COMMUNICATION CRITICAL VALUE ALERT - BLOOD CULTURE IDENTIFICATION (BCID)  Results for orders placed or performed during the hospital encounter of 09/17/16  Blood Culture ID Panel (Reflexed) (Collected: 09/17/2016  7:48 PM)  Result Value Ref Range   Enterococcus species NOT DETECTED NOT DETECTED   Listeria monocytogenes NOT DETECTED NOT DETECTED   Staphylococcus species NOT DETECTED NOT DETECTED   Staphylococcus aureus NOT DETECTED NOT DETECTED   Streptococcus species NOT DETECTED NOT DETECTED   Streptococcus agalactiae NOT DETECTED NOT DETECTED   Streptococcus pneumoniae NOT DETECTED NOT DETECTED   Streptococcus pyogenes NOT DETECTED NOT DETECTED   Acinetobacter baumannii NOT DETECTED NOT DETECTED   Enterobacteriaceae species DETECTED (A) NOT DETECTED   Enterobacter cloacae complex NOT DETECTED NOT DETECTED   Escherichia coli DETECTED (A) NOT DETECTED   Klebsiella oxytoca NOT DETECTED NOT DETECTED   Klebsiella pneumoniae NOT DETECTED NOT DETECTED   Proteus species NOT DETECTED NOT DETECTED   Serratia marcescens NOT DETECTED NOT DETECTED   Carbapenem resistance NOT DETECTED NOT DETECTED   Haemophilus influenzae NOT DETECTED NOT DETECTED   Neisseria meningitidis NOT DETECTED NOT DETECTED   Pseudomonas aeruginosa NOT DETECTED NOT DETECTED   Candida albicans NOT DETECTED NOT DETECTED   Candida glabrata NOT DETECTED NOT DETECTED   Candida krusei NOT DETECTED NOT DETECTED   Candida parapsilosis NOT DETECTED NOT DETECTED   Candida tropicalis NOT DETECTED NOT DETECTED    Name of physician (or Provider) Contacted: Rizwan  Changes to prescribed antibiotics required: Given the patient's recent history of ESBL E.coli in Feb '18 - will transition to Meropenem to cover empirically until sensitivities have resulted.   Lawson Radar 09/18/2016  10:39 AM

## 2016-09-18 NOTE — Evaluation (Signed)
Physical Therapy Evaluation Patient Details Name: [name] [name] MRN: [id number] DOB: 1945/09/02 Today's Date: 09/18/2016   History of Present Illness  Jovanni Eckhart is a 71 y.o. female with medical history significant of hypertension, hyperlipidemia, hypothyroidism, depression, PVD, Parkinson's disease fibromyalgia, hypercoagulable state, PE on Coumadin, mild dementia, who presents with right foot pain, altered mental status and fall. found to have sepsis related to UTI and R foot cellulitits  Clinical Impression   Pt admitted with above diagnosis. Pt currently with functional limitations due to the deficits listed below (see PT Problem List). Prior to admission managing independently with ADLs and mobility with assistvie device; currently presents with functional dependencies; I anticipate that as she improves medically, she will make good functional gains as well; Pt will benefit from skilled PT to increase their independence and safety with mobility to allow discharge to the venue listed below.       Follow Up Recommendations Home health PT    Equipment Recommendations  Rolling walker with 5" wheels;3in1 (PT) (may already have)    Recommendations for Other Services       Precautions / Restrictions Precautions Precautions: Fall Precaution Comments: Fall risk is greatly reduced with use of RW Restrictions Weight Bearing Restrictions: No      Mobility  Bed Mobility Overal bed mobility: Needs Assistance Bed Mobility: Supine to Sit     Supine to sit: Supervision     General bed mobility comments: Supervision mostly for lines and safety  Transfers Overall transfer level: Needs assistance Equipment used: Rolling walker (2 wheeled) Transfers: Sit to/from [facility] Sit to Stand: Min assist Stand pivot transfers: Min assist       General transfer comment: Min assit to steady; multimodal cues for initiation and coordination of tasks; decr control of descent  to sit  Ambulation/Gait Ambulation/Gait assistance: Min guard;Min assist Ambulation Distance (Feet): 15 Feet Assistive device: Rolling walker (2 wheeled) Gait Pattern/deviations: Decreased step length - right;Decreased step length - left     General Gait Details: Short step length and min assist at times to keep RW close  Science writer    Modified Rankin (Stroke Patients Only)       Balance Overall balance assessment: Needs assistance   Sitting balance-Leahy Scale: Good       Standing balance-Leahy Scale: Poor                               Pertinent Vitals/Pain Pain Assessment: Faces Faces Pain Scale: Hurts a little bit Pain Location: R foot Pain Descriptors / Indicators: Aching Pain Intervention(s): Monitored during session;Other (comment) (cued to use RW to inweigh painful R foot)    Home Living Family/patient expects to be discharged to:: Other (Comment) (Independent living apartment)                      Prior Function Level of Independence: Independent with assistive device(s)         Comments: Reports she walks to dining hall with RW and manages ADLs independently     Hand Dominance   Dominant Hand: Right    Extremity/Trunk Assessment   Upper Extremity Assessment Upper Extremity Assessment: Defer to OT evaluation    Lower Extremity Assessment Lower Extremity Assessment: RLE deficits/detail RLE Deficits / Details: Noted red, warm area of swelling, knot-like, on dorsum lateral aspect of foot; also some  bruising at base of toes       Communication   Communication: No difficulties  Cognition Arousal/Alertness: Awake/alert Behavior During Therapy: WFL for tasks assessed/performed Overall Cognitive Status: Impaired/Different from baseline Area of Impairment: Attention;Safety/judgement;Awareness;Problem solving                   Current Attention Level: Sustained (easily distractable)      Safety/Judgement: Decreased awareness of safety;Decreased awareness of deficits   Problem Solving: Slow processing;Difficulty sequencing;Requires verbal cues;Requires tactile cues        General Comments      Exercises     Assessment/Plan    PT Assessment Patient needs continued PT services  PT Problem List Decreased strength;Decreased activity tolerance;Decreased balance;Decreased mobility;Decreased knowledge of use of DME;Decreased safety awareness;Decreased knowledge of precautions;Pain       PT Treatment Interventions DME instruction;Gait training;Functional mobility training;Therapeutic activities;Therapeutic exercise;Balance training;Patient/family education    PT Goals (Current goals can be found in the Care Plan section)  Acute Rehab PT Goals Patient Stated Goal: get better and get home soon PT Goal Formulation: With patient Time For Goal Achievement: 10/02/16 Potential to Achieve Goals: Good    Frequency Min 3X/week   Barriers to discharge        Co-evaluation               AM-PAC PT "6 Clicks" Daily Activity  Outcome Measure Difficulty turning over in bed (including adjusting bedclothes, sheets and blankets)?: None Difficulty moving from lying on back to sitting on the side of the bed? : A Little Difficulty sitting down on and standing up from a chair with arms (e.g., wheelchair, bedside commode, etc,.)?: A Lot Help needed moving to and from a bed to chair (including a wheelchair)?: A Little Help needed walking in hospital room?: A Little Help needed climbing 3-5 steps with a railing? : A Lot 6 Click Score: 17    End of Session   Activity Tolerance: Patient tolerated treatment well Patient left: in chair;with call bell/phone within reach;with chair alarm set Nurse Communication: Mobility status PT Visit Diagnosis: Unsteadiness on feet (R26.81);Pain Pain - Right/Left: Right Pain - part of body: Ankle and joints of foot    Time: 1130-1154 PT  Time Calculation (min) (ACUTE ONLY): 24 min   Charges:   PT Evaluation $PT Eval Low Complexity: 1 Procedure     PT G Codes:        Roney Marion, PT  Acute Rehabilitation Services Pager [phone] Office [place] 09/18/2016, 1:08 PM

## 2016-09-18 NOTE — Progress Notes (Addendum)
ANTICOAGULATION CONSULT NOTE - Initial Consult  Pharmacy Consult for Coumadin Indication: VTE treatment, h/o clots,  PE, on coumadin PTA  Allergies  Allergen Reactions  . Crestor [Rosuvastatin Calcium] Other (See Comments)    Feeling very bad, aching all over.  . Erythromycin Hives  . Gabapentin Other (See Comments)    Blurred vision  . Pregabalin Other (See Comments)    Crossed vision.  . Carbidopa Other (See Comments)    Headaches, nausea, dizziness  . Macrobid [Nitrofurantoin] Hives  . Losartan Other (See Comments)   Patient Measurements: Height: 5\' 6"  (167.6 cm) Weight: 190 lb (86.2 kg) IBW/kg (Calculated) : 59.3  Vital Signs: Temp: 99.3 F (37.4 C) (07/15 2231) Temp Source: Oral (07/15 2231) BP: 118/67 (07/15 2231) Pulse Rate: 104 (07/15 2231)  Labs:  Recent Labs  09/17/16 1954 09/17/16 2342  HGB 14.1  --   HCT 42.1  --   PLT 273  --   LABPROT  --  30.4*  INR  --  2.83  CREATININE 0.83  --     Estimated Creatinine Clearance: 66.8 mL/min (by C-G formula based on SCr of 0.83 mg/dL).   Medical History: Past Medical History:  Diagnosis Date  . Arthritis   . Chronic insomnia   . Complication of anesthesia    severe itching night of surgery requiring meds- also couldnt swallow- throat "paralyzed""  . Fibromyalgia   . Glaucoma   . Hypercholesterolemia   . Hyperlipidemia   . Hypertension    PCP Dr Cari Caraway- [place]  05/15/11 with clearance and note on chart,    chest x ray, EKG 12/12 [facility], eccho 7/11 [facility]  . Hypothyroidism    s/p graves disease  . Kidney stone   . PD (Parkinson's disease) ([place])   . Peripheral vascular disease ([place])    PULMONARY EMBOLUS x 2- 2011, 2012/ FOLLOWED BY DR [id number] 12/12 [facility]   PT [facility] WILL STOP COUMADIN 3/12 and begin LOVONOX 05/17/11 as previously instructed  . Sinus infection    at present- dr Honor Loh PA aware- was seen there and at PCP 05/10/11  . Thyroid disease    Hypothyroidism    Assessment: 71 y.o female  presents to ED on 09/17/16 with AMS.  Pharmacy consulted to dose warfarin for VTE treatment. Patient is on warfarin PTA for h/o PE.   Patient lives in [facility] independent living and manages her own medications.   PTA warfarin: 4mg  daily except 2mg  every Saturday-per MD visit documentation on 09-07-16. Unknown last dose taken due to AMS.   INR today is therapeutic at 2.83 (also indicating patient is taking her warfarin). CBC stable and no overt s/s bleeding noted.   Goal of Therapy:  INR 2-3 Monitor platelets by anticoagulation protocol: Yes   Plan:  Warfarin 2mg  PO x1 tonight INR daily and CBC as needed Monitor for s/s bleeding   Argie Ramming, PharmD Clinical Pharmacist 09/18/16 1:14 AM

## 2016-09-18 NOTE — Progress Notes (Signed)
CRITICAL VALUE ALERT  Critical Value:  Lactic Acid 2.2   Date & Time Notied:  09/18/16 1:18    Provider Notified:  [place]  Orders Received/Actions taken: 500 mL bolus

## 2016-09-18 NOTE — Progress Notes (Signed)
Pharmacy Antibiotic and Anticoagulation Note  [name] [name] is a 71 y.o. female admitted on 09/17/2016 with AMS.  Blood cx now positive for Ecoli.  Pt has hx of ESBL infection.  Will change abx to Merrem.  Pt also on Coumadin PTA for hx PE.  INR therapeutic.  Pt did receive Coumadin 2mg  dose early this morning.  Will reduce tonight's dose empirically.  Plan: Merrem 1gm IV q8h Follow-up blood cx results, Ecoli sensitivities Coumadin 2mg  PO x 1  Daily PT/INR Med Rec pending  Height: 5\' 6"  (167.6 cm) Weight: 190 lb (86.2 kg) IBW/kg (Calculated) : 59.3  Temp (24hrs), Avg:99.2 F (37.3 C), Min:97.3 F (36.3 C), Max:100.9 F (38.3 C)   Recent Labs Lab 09/17/16 1954 09/17/16 2008 09/17/16 2342 09/18/16 0607  WBC 17.5*  --   --  18.6*  CREATININE 0.83  --   --  0.72  LATICACIDVEN  --  2.11* 2.2* 1.2   This SmartLink has not been configured with any valid records.   Lab Results  Component Value Date   INR 2.59 09/18/2016   INR 2.83 09/17/2016                     Estimated Creatinine Clearance: 69.3 mL/min (by C-G formula based on SCr of 0.72 mg/dL).    Allergies  Allergen Reactions  . Crestor [Rosuvastatin Calcium] Other (See Comments)    Feeling very bad, aching all over.  . Erythromycin Hives  . Gabapentin Other (See Comments)    Blurred vision  . Pregabalin Other (See Comments)    Crossed vision.  . Carbidopa Other (See Comments)    Headaches, nausea, dizziness  . Macrobid [Nitrofurantoin] Hives  . Losartan Other (See Comments)    Antimicrobials this admission: Rocephin 7/15 > 7/16 Merrem 7/16 >  Dose adjustments this admission:   Microbiology results: 7/15 UTI >> 7/15 blood x 2 >> 1/2 GNR, BCID = Ecoli 7/16 MRSA PCR negative  Thank you for allowing pharmacy to be a part of this patient's care.  [employer], Pharm.D., BCPS Clinical Pharmacist Pager: [phone] Clinical phone for 09/18/2016 from 8:30-4:00 is [phone]. After 4pm, please call  Main Rx [phone]) for assistance. 09/18/2016 11:12 AM

## 2016-09-19 DIAGNOSIS — W19XXXD Unspecified fall, subsequent encounter: Secondary | ICD-10-CM

## 2016-09-19 LAB — BASIC METABOLIC PANEL
Anion gap: 9 (ref 5–15)
BUN: 9 mg/dL (ref 6–20)
CHLORIDE: 109 mmol/L (ref 101–111)
CO2: 20 mmol/L — ABNORMAL LOW (ref 22–32)
Calcium: 8.7 mg/dL — ABNORMAL LOW (ref 8.9–10.3)
Creatinine, Ser: 0.58 mg/dL (ref 0.44–1.00)
GFR calc Af Amer: >60 mL/min (ref >60)
GFR calc non Af Amer: >60 mL/min (ref >60)
Glucose, Bld: 123 mg/dL — ABNORMAL HIGH (ref 65–99)
POTASSIUM: 4.1 mmol/L (ref 3.5–5.1)
SODIUM: 138 mmol/L (ref 135–145)

## 2016-09-19 LAB — CBC
HEMATOCRIT: 38.3 % (ref 36.0–46.0)
Hemoglobin: 12.4 g/dL (ref 12.0–15.0)
MCH: 27.8 pg (ref 26.0–34.0)
MCHC: 32.4 g/dL (ref 30.0–36.0)
MCV: 85.9 fL (ref 78.0–100.0)
Platelets: 211 10*3/uL (ref 150–400)
RBC: 4.46 MIL/uL (ref 3.87–5.11)
RDW: 12.9 % (ref 11.5–15.5)
WBC: 10.6 10*3/uL — AB (ref 4.0–10.5)

## 2016-09-19 LAB — PROTIME-INR
INR: 2.46
Prothrombin Time: 27.1 seconds — ABNORMAL HIGH (ref 11.4–15.2)

## 2016-09-19 LAB — GLUCOSE, CAPILLARY: GLUCOSE-CAPILLARY: 113 mg/dL — AB (ref 65–99)

## 2016-09-19 MED ORDER — WARFARIN SODIUM 4 MG PO TABS
4.0000 mg | ORAL_TABLET | Freq: Once | ORAL | Status: AC
  Administered 2016-09-19: 4 mg via ORAL
  Filled 2016-09-19: qty 1

## 2016-09-19 NOTE — Progress Notes (Signed)
Physical Therapy Treatment Patient Details Name: [name] [name] MRN: [id number] DOB: 14-Jun-1945 Today's Date: 09/19/2016    History of Present Illness Safaa Stingley is a 71 y.o. female with medical history significant of hypertension, hyperlipidemia, hypothyroidism, depression, PVD, Parkinson's disease fibromyalgia, hypercoagulable state, PE on Coumadin, mild dementia, who presents with right foot pain, altered mental status and fall. found to have sepsis related to UTI and R foot cellulitits    PT Comments    Patient doing well today. Patient performing bed mobility and transfers with Min Guard to Supervision for line management for overall safety. Patient standing at sink to perform oral and hand hygiene with VC from PT to remain close to sink/device for overall safety. Did lower to place weight through elbows after hand washing, but declined need to take a seated rest break. Patient ambulating approx. 60 feet in hallway with use of RW with required VC for safety with AD. Short shuffle steps noted with increased fatigue. Will continue to follow acutely to maximize functional mobility.    Follow Up Recommendations  Home health PT     Equipment Recommendations       Recommendations for Other Services       Precautions / Restrictions Precautions Precautions: Fall Precaution Comments: Fall risk is greatly reduced with use of RW Restrictions Weight Bearing Restrictions: No    Mobility  Bed Mobility Overal bed mobility: Needs Assistance Bed Mobility: Supine to Sit     Supine to sit: Supervision     General bed mobility comments: supervision with assist to untangle lines for patient safety  Transfers Overall transfer level: Needs assistance Equipment used: Rolling walker (2 wheeled) Transfers: Sit to/from [facility] Sit to Stand: Min guard Stand pivot transfers: Min guard       General transfer comment: able to steady herself upon arising from bed; VC to  remain close to RW for overall safety  Ambulation/Gait Ambulation/Gait assistance: Min guard Ambulation Distance (Feet): 60 Feet Assistive device: Rolling walker (2 wheeled) Gait Pattern/deviations: Decreased step length - right;Decreased step length - left;Shuffle;Trunk flexed     General Gait Details: heavy VC to keep RW close for overall safety during both dynamic standing and gait   Stairs            Wheelchair Mobility    Modified Rankin (Stroke Patients Only)       Balance Overall balance assessment: Needs assistance Sitting-balance support: Feet supported       Standing balance support: During functional activity   Standing balance comment: use of RW as well as sink for dynamic balance                            Cognition Arousal/Alertness: Awake/alert Behavior During Therapy: WFL for tasks assessed/performed                                   General Comments: patient able to speak of her residence prior to admission; does not recall foot injury      Exercises      General Comments General comments (skin integrity, edema, etc.): redness/brusing to R foot; no pain until weight bearing      Pertinent Vitals/Pain Pain Assessment: 0-10 Pain Score: 10-Worst pain ever Pain Location: R foot Pain Descriptors / Indicators: Aching Pain Intervention(s): Monitored during session;Limited activity within patient's tolerance;Repositioned (pain during walking - gait to  patient tolerance)    Home Living                      Prior Function            PT Goals (current goals can now be found in the care plan section) Acute Rehab PT Goals Patient Stated Goal: get better and get home soon PT Goal Formulation: With patient Time For Goal Achievement: 10/02/16 Potential to Achieve Goals: Good    Frequency    Min 3X/week      PT Plan      Co-evaluation              AM-PAC PT "6 Clicks" Daily Activity  Outcome  Measure  Difficulty turning over in bed (including adjusting bedclothes, sheets and blankets)?: None Difficulty moving from lying on back to sitting on the side of the bed? : A Little Difficulty sitting down on and standing up from a chair with arms (e.g., wheelchair, bedside commode, etc,.)?: A Little Help needed moving to and from a bed to chair (including a wheelchair)?: A Little Help needed walking in hospital room?: A Little Help needed climbing 3-5 steps with a railing? : A Lot 6 Click Score: 18    End of Session Equipment Utilized During Treatment: Gait belt Activity Tolerance: Patient tolerated treatment well Patient left: in bed;with call bell/phone within reach   PT Visit Diagnosis: Unsteadiness on feet (R26.81);Pain     Time: 7793-9030 PT Time Calculation (min) (ACUTE ONLY): 27 min  Charges:  $Gait Training: 8-22 mins $Therapeutic Activity: 8-22 mins                    G Codes:       Lanney Gins, PT, DPT 09/19/16 12:33 PM

## 2016-09-19 NOTE — Progress Notes (Signed)
PROGRESS NOTE    Tila Millirons   ULA:[id number]  DOB: 10/19/1945  DOA: 09/17/2016 PCP: Darreld Mclean, MD   Brief Narrative:  [name] [name] is a 71 y.o. female who lives in independent living and has a h/o HTN, HLD, Parkinson's disease, fibromyalgia, hypercoagulable state, PE on Coumadin, PVD, depression who is brought in her her daughter for right foot bruising, redness and swelling. She does not remember injuring it. The toes are bruised and there is a raised red area about 2 '' in diameter on the dorsum of the foot which is tender. She has been walking on it but it is painful.  She is also brought in for confusion and fever and is found to have a UTI. He has frequent UTIs.    Subjective: Alert. Oriented. Eating Ok.  ROS: no complaints of nausea, vomiting, constipation diarrhea, cough, dyspnea or dysuria. No other complaints.   Assessment & Plan:   Principal Problem:   Severe Sepsis due to E coli UTI and bactermia - has had E coli in the past and it was ESBL in Feb of this year - Blood cultures 1/2 sets growing E coli- change Rocephin to Imipenem - sensitivities pending - u culture - 30,000 colonies of E coli  Active Problems:   Acute metabolic encephalopathy - due to above, resolving  Injury of right foot - no fracture noted on imaging- does not appear to be cellulitis  - apply ice and elevate  Hypokalemia - replacing    History of pulmonary embolism/ hypercoagulable state - cont Coumadin    Parkinson's disease  - Sinemet    Benign essential HTN - Avapro    Hyperlipidemia - Lipitor    Hypothyroidism - Synthroid  Depression - Cymbalta  B12 deficiency - on B12 supplements  DVT prophylaxis: Warfarin Code Status: DNR Family Communication:  Disposition Plan: PT eval >> HHPT and OT with intermittent supervision when stable Consultants:    Procedures:    Antimicrobials:  Anti-infectives    Start     Dose/Rate Route Frequency Ordered Stop   09/18/16 2000  cefTRIAXone (ROCEPHIN) 1 g in dextrose 5 % 50 mL IVPB  Status:  Discontinued     1 g 100 mL/hr over 30 Minutes Intravenous Every 24 hours 09/17/16 2116 09/18/16 1042   09/18/16 1445  meropenem (MERREM) 1 g in sodium chloride 0.9 % 100 mL IVPB     1 g 200 mL/hr over 30 Minutes Intravenous Every 8 hours 09/18/16 1432     09/17/16 2045  cefTRIAXone (ROCEPHIN) 1 g in dextrose 5 % 50 mL IVPB     1 g 100 mL/hr over 30 Minutes Intravenous  Once 09/17/16 2044 09/17/16 2121       Objective: Vitals:   09/18/16 0629 09/18/16 2128 09/19/16 0613 09/19/16 1454  BP: 126/60 (!) 148/55 131/67 127/68  Pulse: 88 88 87 90  Resp: 18 18 18  (!) 24  Temp: 98.2 F (36.8 C) 100 F (37.8 C) 98.9 F (37.2 C) 98.3 F (36.8 C)  TempSrc: Oral Oral Oral   SpO2: 95% 100% 98% 97%  Weight:      Height:        Intake/Output Summary (Last 24 hours) at 09/19/16 1516 Last data filed at 09/19/16 0620  Gross per 24 hour  Intake          2406.25 ml  Output             1250 ml  Net  1156.25 ml   Filed Weights   09/17/16 1925  Weight: 86.2 kg (190 lb)    Examination: General exam: Appears comfortable  HEENT: PERRLA, oral mucosa moist, no sclera icterus or thrush Respiratory system: Clear to auscultation. Respiratory effort normal. Cardiovascular system: S1 & S2 heard, RRR.  No murmurs  Gastrointestinal system: Abdomen soft, non-tender, nondistended. Normal bowel sound. No organomegaly Central nervous system: Alert and oriented. No focal neurological deficits. Extremities: No cyanosis, clubbing - mild edema of right foot Skin: No rashes or ulcers- bruising on right toes with erythematosus tender area on dorsum about 2'' in diameter. Psychiatry:  Mood & affect appropriate.     Data Reviewed: I have personally reviewed following labs and imaging studies  CBC:  Recent Labs Lab 09/17/16 1954 09/18/16 0607 09/19/16 0825  WBC 17.5* 18.6* 10.6*  NEUTROABS 14.4*  --   --   HGB  14.1 13.1 12.4  HCT 42.1 40.2 38.3  MCV 85.6 86.3 85.9  PLT 273 271 315   Basic Metabolic Panel:  Recent Labs Lab 09/17/16 1954 09/18/16 0607 09/18/16 0900 09/19/16 0825  NA 133* 139  --  138  K 3.8 3.4*  --  4.1  CL 99* 106  --  109  CO2 23 26  --  20*  GLUCOSE 181* 144*  --  123*  BUN 11 11  --  9  CREATININE 0.83 0.72  --  0.58  CALCIUM 9.2 8.7*  --  8.7*  MG  --   --  1.9  --    GFR: Estimated Creatinine Clearance: 69.3 mL/min (by C-G formula based on SCr of 0.58 mg/dL). Liver Function Tests:  Recent Labs Lab 09/17/16 1954  AST 32  ALT 26  ALKPHOS 120  BILITOT 1.2  PROT 6.9  ALBUMIN 3.5   No results for input(s): LIPASE, AMYLASE in the last 168 hours. No results for input(s): AMMONIA in the last 168 hours. Coagulation Profile:  Recent Labs Lab 09/17/16 2342 09/18/16 0607 09/19/16 0553  INR 2.83 2.59 2.46   Cardiac Enzymes: No results for input(s): CKTOTAL, CKMB, CKMBINDEX, TROPONINI in the last 168 hours. BNP (last 3 results) No results for input(s): PROBNP in the last 8760 hours. HbA1C: No results for input(s): HGBA1C in the last 72 hours. CBG:  Recent Labs Lab 09/18/16 0825 09/19/16 0801  GLUCAP 130* 113*   Lipid Profile: No results for input(s): CHOL, HDL, LDLCALC, TRIG, CHOLHDL, LDLDIRECT in the last 72 hours. Thyroid Function Tests: No results for input(s): TSH, T4TOTAL, FREET4, T3FREE, THYROIDAB in the last 72 hours. Anemia Panel: No results for input(s): VITAMINB12, FOLATE, FERRITIN, TIBC, IRON, RETICCTPCT in the last 72 hours. Urine analysis:    Component Value Date/Time   COLORURINE YELLOW 09/17/2016 1942   APPEARANCEUR CLOUDY (A) 09/17/2016 1942   LABSPEC 1.013 09/17/2016 1942   PHURINE 6.0 09/17/2016 1942   GLUCOSEU NEGATIVE 09/17/2016 1942   HGBUR MODERATE (A) 09/17/2016 1942   BILIRUBINUR NEGATIVE 09/17/2016 1942   BILIRUBINUR negative 06/27/2016 1222   KETONESUR NEGATIVE 09/17/2016 1942   PROTEINUR 100 (A) 09/17/2016  1942   UROBILINOGEN negative (A) 06/27/2016 1222   UROBILINOGEN 0.2 09/07/2012 1049   NITRITE POSITIVE (A) 09/17/2016 1942   LEUKOCYTESUR LARGE (A) 09/17/2016 1942   Sepsis Labs: @LABRCNTIP (procalcitonin:4,lacticidven:4) ) Recent Results (from the past 240 hour(s))  Urine Culture     Status: Abnormal (Preliminary result)   Collection Time: 09/17/16  7:40 PM  Result Value Ref Range Status   Specimen Description URINE, CATHETERIZED  Final   Special Requests NONE  Final   Culture (A)  Final    30,000 COLONIES/mL ESCHERICHIA COLI SUSCEPTIBILITIES TO FOLLOW    Report Status PENDING  Incomplete  Blood Culture (routine x 2)     Status: Abnormal (Preliminary result)   Collection Time: 09/17/16  7:48 PM  Result Value Ref Range Status   Specimen Description BLOOD RIGHT ANTECUBITAL  Final   Special Requests   Final    BOTTLES DRAWN AEROBIC AND ANAEROBIC Blood Culture adequate volume   Culture  Setup Time   Final    GRAM NEGATIVE RODS IN BOTH AEROBIC AND ANAEROBIC BOTTLES CRITICAL RESULT CALLED TO, READ BACK BY AND VERIFIED WITH: E MARTIN,PHARMD AT 1035 09/18/16 BY L BENFIELD    Culture ESCHERICHIA COLI SUSCEPTIBILITIES TO FOLLOW  (A)  Final   Report Status PENDING  Incomplete  Blood Culture ID Panel (Reflexed)     Status: Abnormal   Collection Time: 09/17/16  7:48 PM  Result Value Ref Range Status   Enterococcus species NOT DETECTED NOT DETECTED Final   Listeria monocytogenes NOT DETECTED NOT DETECTED Final   Staphylococcus species NOT DETECTED NOT DETECTED Final   Staphylococcus aureus NOT DETECTED NOT DETECTED Final   Streptococcus species NOT DETECTED NOT DETECTED Final   Streptococcus agalactiae NOT DETECTED NOT DETECTED Final   Streptococcus pneumoniae NOT DETECTED NOT DETECTED Final   Streptococcus pyogenes NOT DETECTED NOT DETECTED Final   Acinetobacter baumannii NOT DETECTED NOT DETECTED Final   Enterobacteriaceae species DETECTED (A) NOT DETECTED Final    Comment:  Enterobacteriaceae represent a large family of gram-negative bacteria, not a single organism. CRITICAL RESULT CALLED TO, READ BACK BY AND VERIFIED WITH: E MARTIN,PHARMD AT 1035 09/18/16 BY L BENFIELD    Enterobacter cloacae complex NOT DETECTED NOT DETECTED Final   Escherichia coli DETECTED (A) NOT DETECTED Final    Comment: CRITICAL RESULT CALLED TO, READ BACK BY AND VERIFIED WITH: E MARTIN,PHARMD AT 1035 09/18/16 BY L BENFIELD    Klebsiella oxytoca NOT DETECTED NOT DETECTED Final   Klebsiella pneumoniae NOT DETECTED NOT DETECTED Final   Proteus species NOT DETECTED NOT DETECTED Final   Serratia marcescens NOT DETECTED NOT DETECTED Final   Carbapenem resistance NOT DETECTED NOT DETECTED Final   Haemophilus influenzae NOT DETECTED NOT DETECTED Final   Neisseria meningitidis NOT DETECTED NOT DETECTED Final   Pseudomonas aeruginosa NOT DETECTED NOT DETECTED Final   Candida albicans NOT DETECTED NOT DETECTED Final   Candida glabrata NOT DETECTED NOT DETECTED Final   Candida krusei NOT DETECTED NOT DETECTED Final   Candida parapsilosis NOT DETECTED NOT DETECTED Final   Candida tropicalis NOT DETECTED NOT DETECTED Final  Blood Culture (routine x 2)     Status: None (Preliminary result)   Collection Time: 09/17/16  7:54 PM  Result Value Ref Range Status   Specimen Description BLOOD RIGHT ARM  Final   Special Requests   Final    BOTTLES DRAWN AEROBIC AND ANAEROBIC Blood Culture adequate volume   Culture NO GROWTH < 24 HOURS  Final   Report Status PENDING  Incomplete  MRSA PCR Screening     Status: None   Collection Time: 09/18/16  7:59 AM  Result Value Ref Range Status   MRSA by PCR NEGATIVE NEGATIVE Final    Comment:        The GeneXpert MRSA Assay (FDA approved for NASAL specimens only), is one component of a comprehensive MRSA colonization surveillance program. It is not intended to diagnose  MRSA infection nor to guide or monitor treatment for MRSA infections.           Radiology Studies: Ct Head Wo Contrast  Result Date: 09/17/2016 CLINICAL DATA:  Fall.  Confusion. EXAM: CT HEAD WITHOUT CONTRAST TECHNIQUE: Contiguous axial images were obtained from the base of the skull through the vertex without intravenous contrast. COMPARISON:  04/29/2016 head CT. FINDINGS: Brain: No evidence of parenchymal hemorrhage or extra-axial fluid collection. No mass lesion, mass effect, or midline shift. No CT evidence of acute infarction. Nonspecific moderate subcortical and periventricular white matter hypodensity, most in keeping with chronic small vessel ischemic change. Generalized cerebral volume loss. Cavum septum pellucidum et vergae. No ventriculomegaly. Vascular: No acute abnormality. Skull: No evidence of calvarial fracture. Sinuses/Orbits: No fluid levels. Mild mucoperiosteal thickening and partial opacification of the ethmoidal air cells. Other:  The mastoid air cells are unopacified. IMPRESSION: 1. No evidence of acute intracranial abnormality. No evidence of calvarial fracture. 2. Generalized cerebral volume loss and moderate chronic small vessel ischemic change . Electronically Signed   By: Ilona Sorrel M.D.   On: 09/17/2016 21:53   Dg Chest Port 1 View  Result Date: 09/17/2016 CLINICAL DATA:  71 year old female with history of fever and altered level of consciousness. EXAM: PORTABLE CHEST 1 VIEW COMPARISON:  Chest x-ray 04/29/2016. FINDINGS: Lung volumes are low. Diffuse peribronchial cuffing and widespread interstitial prominence. No confluent consolidative airspace disease. No pleural effusions. No cephalization of the pulmonary vasculature. Heart size is upper limits of normal. The patient is rotated to the left on today's exam, resulting in distortion of the mediastinal contours and reduced diagnostic sensitivity and specificity for mediastinal pathology. Atherosclerosis in the thoracic aorta. IMPRESSION: 1. Diffuse peribronchial cuffing, concerning for an acute  bronchitis. 2. Aortic atherosclerosis. Electronically Signed   By: Vinnie Langton M.D.   On: 09/17/2016 19:40      Scheduled Meds: . atorvastatin  10 mg Oral Daily  . Carbidopa-Levodopa ER  1 tablet Oral TID  . celecoxib  100 mg Oral BID  . cholecalciferol  5,000 Units Oral Daily  . cyanocobalamin  500 mcg Oral Daily  . DULoxetine  60 mg Oral Daily  . fluticasone  1 spray Each Nare Daily  . irbesartan  150 mg Oral Daily  . levothyroxine  150 mcg Oral QAC breakfast  . loratadine  10 mg Oral Daily  . magnesium oxide  200 mg Oral Daily  . omega-3 acid ethyl esters  1 g Oral Daily  . sodium chloride flush  3 mL Intravenous Q12H  . warfarin  4 mg Oral ONCE-1800  . Warfarin - Pharmacist Dosing Inpatient   Does not apply q1800   Continuous Infusions: . sodium chloride 75 mL/hr at 09/19/16 1409  . meropenem (MERREM) IV Stopped (09/19/16 1440)  . sodium chloride       LOS: 2 days    Time spent in minutes: 35    Debbe Odea, MD [facility] Pager: [email or web address] Password [facility] 09/19/2016, 3:16 PM

## 2016-09-19 NOTE — Progress Notes (Signed)
Occupational Therapy Treatment Patient Details Name: [name] [name] MRN: [id number] DOB: 1946/01/17 Today's Date: 09/19/2016    History of present illness [name] [name] is a 71 y.o. female with medical history significant of hypertension, hyperlipidemia, hypothyroidism, depression, PVD, Parkinson's disease fibromyalgia, hypercoagulable state, PE on Coumadin, mild dementia, who presents with right foot pain, altered mental status and fall. found to have sepsis related to UTI and R foot cellulitits   OT comments  Pt with inconsistency in ability to perform bed mobility vs PT visit. She demonstrated decreased safety with tendency to release walker with transfers. Requires min guard assist for toileting standing grooming and min assist to change soiled gown. Pt needs to function modified independently to return to her home. Will continue to follow.  Follow Up Recommendations  Home health OT;Supervision - Intermittent    Equipment Recommendations  None recommended by OT    Recommendations for Other Services      Precautions / Restrictions Precautions Precautions: Fall Precaution Comments: pt needing cues to use walker, reports frequently forgetting Restrictions Weight Bearing Restrictions: No       Mobility Bed Mobility Overal bed mobility: Needs Assistance Bed Mobility: Supine to Sit     Supine to sit: Mod assist     General bed mobility comments: assist to R side of bed, although reporting this is the side she gets up to at home  Transfers Overall transfer level: Needs assistance Equipment used: Rolling walker (2 wheeled) Transfers: Sit to/from Stand Sit to Stand: Min guard        General transfer comment: cues for hand placement, close guard for safety    Balance Overall balance assessment: Needs assistance Sitting-balance support: Feet supported Sitting balance-Leahy Scale: Good     Standing balance support: During functional activity Standing balance-Leahy Scale:  Poor Standing balance comment: assist of RW and leans on sink                            ADL either performed or assessed with clinical judgement   ADL Overall ADL's : Needs assistance/impaired     Grooming: Wash/dry hands;Wash/dry face;Standing;Min guard           Upper Body Dressing : Minimal assistance;Sitting       Toilet Transfer: Min guard;Ambulation;BSC;RW   Toileting- Water quality scientist and Hygiene: Min guard;Sit to/from stand       Functional mobility during ADLs: Min guard;Rolling walker;Cueing for safety       Vision       Perception     Praxis      Cognition Arousal/Alertness: Awake/alert Behavior During Therapy: WFL for tasks assessed/performed Overall Cognitive Status: Impaired/Different from baseline Area of Impairment: Attention;Safety/judgement;Awareness;Problem solving                   Current Attention Level: Sustained (easily distracted)     Safety/Judgement: Decreased awareness of safety;Decreased awareness of deficits   Problem Solving: Slow processing;Difficulty sequencing;Requires verbal cues;Requires tactile cues General Comments: does not recall hurting her foot, attempting to leave walker, distracted by multiple lines        Exercises     Shoulder Instructions       General Comments redness/brusing to R foot; no pain until weight bearing    Pertinent Vitals/ Pain       Pain Assessment: Faces Pain Score: 10-Worst pain ever Faces Pain Scale: Hurts little more Pain Location: R foot Pain Descriptors / Indicators: Sore Pain Intervention(s): Monitored  during session;Repositioned  Home Living                                          Prior Functioning/Environment              Frequency  Min 3X/week        Progress Toward Goals  OT Goals(current goals can now be found in the care plan section)  Progress towards OT goals: Progressing toward goals  Acute Rehab OT  Goals Patient Stated Goal: get better and get home soon OT Goal Formulation: With patient Time For Goal Achievement: 10/02/16 Potential to Achieve Goals: Good  Plan      Co-evaluation                 AM-PAC PT "6 Clicks" Daily Activity     Outcome Measure   Help from another person eating meals?: None Help from another person taking care of personal grooming?: A Little Help from another person toileting, which includes using toliet, bedpan, or urinal?: A Little Help from another person bathing (including washing, rinsing, drying)?: A Little Help from another person to put on and taking off regular upper body clothing?: A Little Help from another person to put on and taking off regular lower body clothing?: A Little 6 Click Score: 19    End of Session Equipment Utilized During Treatment: Gait belt;Rolling walker  OT Visit Diagnosis: Unsteadiness on feet (R26.81)   Activity Tolerance Patient tolerated treatment well   Patient Left in chair;with call bell/phone within reach;with chair alarm set   Nurse Communication          Time: 3235-5732 OT Time Calculation (min): 20 min  Charges: OT General Charges $OT Visit: 1 Procedure OT Treatments $Self Care/Home Management : 8-22 mins   Malka So 09/19/2016, 1:49 PM [phone]

## 2016-09-19 NOTE — Progress Notes (Signed)
ANTICOAGULATION CONSULT NOTE - Follow Up Consult  Pharmacy Consult for warfarin Indication: VTE treatment, h/o clots,  PE, on coumadin PTA  Allergies  Allergen Reactions  . Crestor [Rosuvastatin Calcium] Other (See Comments)    Feeling very bad, aching all over.  . Erythromycin Hives  . Gabapentin Other (See Comments)    Blurred vision  . Pregabalin Other (See Comments)    Crossed vision.  . Carbidopa Other (See Comments)    Headaches, nausea, dizziness  . Macrobid [Nitrofurantoin] Hives  . Losartan Other (See Comments)    Patient Measurements: Height: 5\' 6"  (167.6 cm) Weight: 190 lb (86.2 kg) IBW/kg (Calculated) : 59.3  Vital Signs: Temp: 98.9 F (37.2 C) (07/17 0613) Temp Source: Oral (07/17 0613) BP: 131/67 (07/17 0762) Pulse Rate: 87 (07/17 0613)  Labs:  Recent Labs  09/17/16 1954 09/17/16 2342 09/18/16 0607 09/19/16 0553 09/19/16 0825  HGB 14.1  --  13.1  --  12.4  HCT 42.1  --  40.2  --  38.3  PLT 273  --  271  --  211  LABPROT  --  30.4* 28.2* 27.1*  --   INR  --  2.83 2.59 2.46  --   CREATININE 0.83  --  0.72  --  0.58    Estimated Creatinine Clearance: 69.3 mL/min (by C-G formula based on SCr of 0.58 mg/dL).    Assessment: Pharmacy consulted to dose warfarin for VTE treatment. Patient is on warfarin PTA for h/o PE. Pt manages her own medications.   PTA warfarin: 4mg  daily except 2mg  every Saturday-per MD visit documentation on 09-07-16. Unknown last dose taken due to AMS.   INR trending down slightly but remains therapeutic at 2.46. CBC wnl, no overt s/s bleeding noted. PO intake ~25%, will continue dosing daily for now  Goal of Therapy:  INR 2-3 Monitor platelets by anticoagulation protocol: Yes   Plan:  Give warfarin 4 mg po x 1 Monitor daily INR, CBC, clinical course, s/sx of bleed, PO intake, DDI   Thank you for allowing [name] to participate in this patients care.  Jens Som, PharmD Clinical phone for 09/19/2016 from 7a-3:30p: [phone]  [phone] If after 3:30p, please call main pharmacy at: [phone] 09/19/2016 11:41 AM

## 2016-09-20 DIAGNOSIS — N390 Urinary tract infection, site not specified: Secondary | ICD-10-CM

## 2016-09-20 DIAGNOSIS — G9341 Metabolic encephalopathy: Secondary | ICD-10-CM

## 2016-09-20 LAB — CULTURE, BLOOD (ROUTINE X 2): Special Requests: ADEQUATE

## 2016-09-20 LAB — BASIC METABOLIC PANEL
Anion gap: 6 (ref 5–15)
BUN: 8 mg/dL (ref 6–20)
CHLORIDE: 110 mmol/L (ref 101–111)
CO2: 24 mmol/L (ref 22–32)
CREATININE: 0.58 mg/dL (ref 0.44–1.00)
Calcium: 8.7 mg/dL — ABNORMAL LOW (ref 8.9–10.3)
GFR calc Af Amer: >60 mL/min (ref >60)
GFR calc non Af Amer: >60 mL/min (ref >60)
GLUCOSE: 122 mg/dL — AB (ref 65–99)
POTASSIUM: 4 mmol/L (ref 3.5–5.1)
SODIUM: 140 mmol/L (ref 135–145)

## 2016-09-20 LAB — URINE CULTURE

## 2016-09-20 LAB — PROTIME-INR
INR: 2.14
PROTHROMBIN TIME: 24.3 s — AB (ref 11.4–15.2)

## 2016-09-20 LAB — CBC
HEMATOCRIT: 37.1 % (ref 36.0–46.0)
HEMOGLOBIN: 11.8 g/dL — AB (ref 12.0–15.0)
MCH: 27.4 pg (ref 26.0–34.0)
MCHC: 31.8 g/dL (ref 30.0–36.0)
MCV: 86.3 fL (ref 78.0–100.0)
Platelets: 215 10*3/uL (ref 150–400)
RBC: 4.3 MIL/uL (ref 3.87–5.11)
RDW: 13.3 % (ref 11.5–15.5)
WBC: 5.3 10*3/uL (ref 4.0–10.5)

## 2016-09-20 LAB — GLUCOSE, CAPILLARY: Glucose-Capillary: 117 mg/dL — ABNORMAL HIGH (ref 65–99)

## 2016-09-20 MED ORDER — SENNA 8.6 MG PO TABS
1.0000 | ORAL_TABLET | Freq: Every day | ORAL | Status: DC | PRN
  Administered 2016-09-21: 8.6 mg via ORAL
  Filled 2016-09-20: qty 1

## 2016-09-20 MED ORDER — WARFARIN SODIUM 5 MG PO TABS
5.0000 mg | ORAL_TABLET | Freq: Once | ORAL | Status: AC
  Administered 2016-09-20: 5 mg via ORAL
  Filled 2016-09-20: qty 1

## 2016-09-20 NOTE — Progress Notes (Addendum)
PROGRESS NOTE   [name] [name]   ZOX:[id number]  DOB: 08/01/1945  DOA: 09/17/2016 PCP: Darreld Mclean, MD   Brief Narrative:  [name] [name] is a 71 y.o. female who lives in independent living and has a h/o HTN, HLD, Parkinson's disease, fibromyalgia, hypercoagulable state, PE on Coumadin, PVD, depression who is brought in her her daughter for right foot bruising, redness and swelling. She does not remember injuring it. The toes are bruised and there is a raised red area about 2 '' in diameter on the dorsum of the foot which is tender. She has been walking on it but it is painful.   She presented with confusion and fever and found to have UTI. Reportedly in history has had frequent UTIs.   Subjective: The patient has no new complaints. No acute issues reported overnight.  Assessment & Plan:   Principal Problem:   Severe Sepsis due to E coli UTI and bactermia - E coli in the past and as well as ESBL in Feb of this year - Blood cultures 1/2 sets growing E coli- change Rocephin to Imipenem - sensitivities still pending - u culture - 30,000 colonies of E coli  Active Problems:   Acute metabolic encephalopathy - Suspect it is improving, secondary to principal problem listed above. Continue current antibiotic regimen.  Injury of right foot - no fracture noted on imaging- does not appear to be cellulitis  - apply ice and elevate - Likely recommend podiatry as outpatient  Hypokalemia - After replacement within normal limits    History of pulmonary embolism/ hypercoagulable state - Pharmacy dosing Coumadin    Parkinson's disease  - Sinemet    Benign essential HTN - Stable on Avapro    Hyperlipidemia -  Stable on Lipitor    Hypothyroidism - Stable on Synthroid  Depression - Stable on Cymbalta  B12 deficiency - on B12 supplements  DVT prophylaxis: Warfarin Code Status: DNR Family Communication:  Disposition Plan: PT eval >> HHPT and OT with intermittent supervision  when stable  Consultants:   None   Procedures:  none  Antimicrobials:  Anti-infectives    Start     Dose/Rate Route Frequency Ordered Stop   09/18/16 2000  cefTRIAXone (ROCEPHIN) 1 g in dextrose 5 % 50 mL IVPB  Status:  Discontinued     1 g 100 mL/hr over 30 Minutes Intravenous Every 24 hours 09/17/16 2116 09/18/16 1042   09/18/16 1445  meropenem (MERREM) 1 g in sodium chloride 0.9 % 100 mL IVPB     1 g 200 mL/hr over 30 Minutes Intravenous Every 8 hours 09/18/16 1432     09/17/16 2045  cefTRIAXone (ROCEPHIN) 1 g in dextrose 5 % 50 mL IVPB     1 g 100 mL/hr over 30 Minutes Intravenous  Once 09/17/16 2044 09/17/16 2121       Objective: Vitals:   09/19/16 1454 09/19/16 2035 09/20/16 0558 09/20/16 1517  BP: 127/68 135/71 (!) 141/64 140/71  Pulse: 90 80 78 90  Resp: (!) 24 18 18  (!) 24  Temp: 98.3 F (36.8 C) 98.8 F (37.1 C) 98.2 F (36.8 C) 97.8 F (36.6 C)  TempSrc:    Oral  SpO2: 97% 99% 96% 97%  Weight:      Height:        Intake/Output Summary (Last 24 hours) at 09/20/16 1652 Last data filed at 09/20/16 1050  Gross per 24 hour  Intake  0 ml  Output             1301 ml  Net            -1301 ml   Filed Weights   09/17/16 1925  Weight: 86.2 kg (190 lb)    Examination: General exam: Awake and alert HEENT: PERRLA, oral mucosa moist, no sclera icterus or thrush Respiratory system: Clear to auscultation. Respiratory effort normal. No wheezes Cardiovascular system: S1 & S2 heard, RRR.  No murmurs  Gastrointestinal system: Abdomen soft, non-tender, nondistended. Normal bowel sound. No organomegaly Central nervous system: Alert and oriented. No focal neurological deficits. Extremities: No cyanosis, clubbing - mild edema of right foot Skin: No rashes or ulcers- bruising on right toes with erythematosus tender area on dorsum about 2'' in diameter. Psychiatry:  Mood & affect appropriate.     Data Reviewed: I have personally reviewed following  labs and imaging studies  CBC:  Recent Labs Lab 09/17/16 1954 09/18/16 0607 09/19/16 0825 09/20/16 0405  WBC 17.5* 18.6* 10.6* 5.3  NEUTROABS 14.4*  --   --   --   HGB 14.1 13.1 12.4 11.8*  HCT 42.1 40.2 38.3 37.1  MCV 85.6 86.3 85.9 86.3  PLT 273 271 211 314   Basic Metabolic Panel:  Recent Labs Lab 09/17/16 1954 09/18/16 0607 09/18/16 0900 09/19/16 0825 09/20/16 0405  NA 133* 139  --  138 140  K 3.8 3.4*  --  4.1 4.0  CL 99* 106  --  109 110  CO2 23 26  --  20* 24  GLUCOSE 181* 144*  --  123* 122*  BUN 11 11  --  9 8  CREATININE 0.83 0.72  --  0.58 0.58  CALCIUM 9.2 8.7*  --  8.7* 8.7*  MG  --   --  1.9  --   --    GFR: Estimated Creatinine Clearance: 69.3 mL/min (by C-G formula based on SCr of 0.58 mg/dL). Liver Function Tests:  Recent Labs Lab 09/17/16 1954  AST 32  ALT 26  ALKPHOS 120  BILITOT 1.2  PROT 6.9  ALBUMIN 3.5   No results for input(s): LIPASE, AMYLASE in the last 168 hours. No results for input(s): AMMONIA in the last 168 hours. Coagulation Profile:  Recent Labs Lab 09/17/16 2342 09/18/16 0607 09/19/16 0553 09/20/16 0405  INR 2.83 2.59 2.46 2.14   Cardiac Enzymes: No results for input(s): CKTOTAL, CKMB, CKMBINDEX, TROPONINI in the last 168 hours. BNP (last 3 results) No results for input(s): PROBNP in the last 8760 hours. HbA1C: No results for input(s): HGBA1C in the last 72 hours. CBG:  Recent Labs Lab 09/18/16 0825 09/19/16 0801 09/20/16 0807  GLUCAP 130* 113* 117*   Lipid Profile: No results for input(s): CHOL, HDL, LDLCALC, TRIG, CHOLHDL, LDLDIRECT in the last 72 hours. Thyroid Function Tests: No results for input(s): TSH, T4TOTAL, FREET4, T3FREE, THYROIDAB in the last 72 hours. Anemia Panel: No results for input(s): VITAMINB12, FOLATE, FERRITIN, TIBC, IRON, RETICCTPCT in the last 72 hours. Urine analysis:    Component Value Date/Time   COLORURINE YELLOW 09/17/2016 1942   APPEARANCEUR CLOUDY (A) 09/17/2016 1942    LABSPEC 1.013 09/17/2016 1942   PHURINE 6.0 09/17/2016 1942   GLUCOSEU NEGATIVE 09/17/2016 1942   HGBUR MODERATE (A) 09/17/2016 1942   BILIRUBINUR NEGATIVE 09/17/2016 1942   BILIRUBINUR negative 06/27/2016 [place] 09/17/2016 1942   PROTEINUR 100 (A) 09/17/2016 1942   UROBILINOGEN negative (A) 06/27/2016 1222  UROBILINOGEN 0.2 09/07/2012 1049   NITRITE POSITIVE (A) 09/17/2016 1942   LEUKOCYTESUR LARGE (A) 09/17/2016 1942   Sepsis Labs: @LABRCNTIP (procalcitonin:4,lacticidven:4) ) Recent Results (from the past 240 hour(s))  Urine Culture     Status: Abnormal   Collection Time: 09/17/16  7:40 PM  Result Value Ref Range Status   Specimen Description URINE, CATHETERIZED  Final   Special Requests NONE  Final   Culture (A)  Final    30,000 COLONIES/mL ESCHERICHIA COLI Confirmed Extended Spectrum Beta-Lactamase Producer (ESBL)    Report Status 09/20/2016 FINAL  Final   Organism ID, Bacteria ESCHERICHIA COLI (A)  Final      Susceptibility   Escherichia coli - MIC*    AMPICILLIN >=32 RESISTANT Resistant     CEFAZOLIN >=64 RESISTANT Resistant     CEFTRIAXONE >=64 RESISTANT Resistant     CIPROFLOXACIN >=4 RESISTANT Resistant     GENTAMICIN <=1 SENSITIVE Sensitive     IMIPENEM <=0.25 SENSITIVE Sensitive     NITROFURANTOIN <=16 SENSITIVE Sensitive     TRIMETH/SULFA >=320 RESISTANT Resistant     AMPICILLIN/SULBACTAM >=32 RESISTANT Resistant     PIP/TAZO <=4 SENSITIVE Sensitive     Extended ESBL POSITIVE Resistant     * 30,000 COLONIES/mL ESCHERICHIA COLI  Blood Culture (routine x 2)     Status: Abnormal   Collection Time: 09/17/16  7:48 PM  Result Value Ref Range Status   Specimen Description BLOOD RIGHT ANTECUBITAL  Final   Special Requests   Final    BOTTLES DRAWN AEROBIC AND ANAEROBIC Blood Culture adequate volume   Culture  Setup Time   Final    GRAM NEGATIVE RODS IN BOTH AEROBIC AND ANAEROBIC BOTTLES CRITICAL RESULT CALLED TO, READ BACK BY AND VERIFIED  WITH: E MARTIN,PHARMD AT 1035 09/18/16 BY L BENFIELD    Culture (A)  Final    ESCHERICHIA COLI Confirmed Extended Spectrum Beta-Lactamase Producer (ESBL)    Report Status 09/20/2016 FINAL  Final   Organism ID, Bacteria ESCHERICHIA COLI  Final      Susceptibility   Escherichia coli - MIC*    AMPICILLIN >=32 RESISTANT Resistant     CEFAZOLIN >=64 RESISTANT Resistant     CEFEPIME >=64 RESISTANT Resistant     CEFTAZIDIME RESISTANT Resistant     CEFTRIAXONE >=64 RESISTANT Resistant     CIPROFLOXACIN >=4 RESISTANT Resistant     GENTAMICIN <=1 SENSITIVE Sensitive     IMIPENEM <=0.25 SENSITIVE Sensitive     TRIMETH/SULFA >=320 RESISTANT Resistant     AMPICILLIN/SULBACTAM >=32 RESISTANT Resistant     PIP/TAZO <=4 SENSITIVE Sensitive     Extended ESBL POSITIVE Resistant     * ESCHERICHIA COLI  Blood Culture ID Panel (Reflexed)     Status: Abnormal   Collection Time: 09/17/16  7:48 PM  Result Value Ref Range Status   Enterococcus species NOT DETECTED NOT DETECTED Final   Listeria monocytogenes NOT DETECTED NOT DETECTED Final   Staphylococcus species NOT DETECTED NOT DETECTED Final   Staphylococcus aureus NOT DETECTED NOT DETECTED Final   Streptococcus species NOT DETECTED NOT DETECTED Final   Streptococcus agalactiae NOT DETECTED NOT DETECTED Final   Streptococcus pneumoniae NOT DETECTED NOT DETECTED Final   Streptococcus pyogenes NOT DETECTED NOT DETECTED Final   Acinetobacter baumannii NOT DETECTED NOT DETECTED Final   Enterobacteriaceae species DETECTED (A) NOT DETECTED Final    Comment: Enterobacteriaceae represent a large family of gram-negative bacteria, not a single organism. CRITICAL RESULT CALLED TO, READ BACK BY AND VERIFIED WITH:  E MARTIN,PHARMD AT 1035 09/18/16 BY L BENFIELD    Enterobacter cloacae complex NOT DETECTED NOT DETECTED Final   Escherichia coli DETECTED (A) NOT DETECTED Final    Comment: CRITICAL RESULT CALLED TO, READ BACK BY AND VERIFIED WITH: E MARTIN,PHARMD  AT 1035 09/18/16 BY L BENFIELD    Klebsiella oxytoca NOT DETECTED NOT DETECTED Final   Klebsiella pneumoniae NOT DETECTED NOT DETECTED Final   Proteus species NOT DETECTED NOT DETECTED Final   Serratia marcescens NOT DETECTED NOT DETECTED Final   Carbapenem resistance NOT DETECTED NOT DETECTED Final   Haemophilus influenzae NOT DETECTED NOT DETECTED Final   Neisseria meningitidis NOT DETECTED NOT DETECTED Final   Pseudomonas aeruginosa NOT DETECTED NOT DETECTED Final   Candida albicans NOT DETECTED NOT DETECTED Final   Candida glabrata NOT DETECTED NOT DETECTED Final   Candida krusei NOT DETECTED NOT DETECTED Final   Candida parapsilosis NOT DETECTED NOT DETECTED Final   Candida tropicalis NOT DETECTED NOT DETECTED Final  Blood Culture (routine x 2)     Status: None (Preliminary result)   Collection Time: 09/17/16  7:54 PM  Result Value Ref Range Status   Specimen Description BLOOD RIGHT ARM  Final   Special Requests   Final    BOTTLES DRAWN AEROBIC AND ANAEROBIC Blood Culture adequate volume   Culture NO GROWTH 3 DAYS  Final   Report Status PENDING  Incomplete  MRSA PCR Screening     Status: None   Collection Time: 09/18/16  7:59 AM  Result Value Ref Range Status   MRSA by PCR NEGATIVE NEGATIVE Final    Comment:        The GeneXpert MRSA Assay (FDA approved for NASAL specimens only), is one component of a comprehensive MRSA colonization surveillance program. It is not intended to diagnose MRSA infection nor to guide or monitor treatment for MRSA infections.          Radiology Studies: No results found.    Scheduled Meds: . atorvastatin  10 mg Oral Daily  . Carbidopa-Levodopa ER  1 tablet Oral TID  . celecoxib  100 mg Oral BID  . cholecalciferol  5,000 Units Oral Daily  . cyanocobalamin  500 mcg Oral Daily  . DULoxetine  60 mg Oral Daily  . fluticasone  1 spray Each Nare Daily  . irbesartan  150 mg Oral Daily  . levothyroxine  150 mcg Oral QAC breakfast  .  loratadine  10 mg Oral Daily  . magnesium oxide  200 mg Oral Daily  . omega-3 acid ethyl esters  1 g Oral Daily  . sodium chloride flush  3 mL Intravenous Q12H  . Warfarin - Pharmacist Dosing Inpatient   Does not apply q1800   Continuous Infusions: . sodium chloride 75 mL/hr at 09/20/16 1557  . meropenem (MERREM) IV Stopped (09/20/16 1437)  . sodium chloride       LOS: 3 days    Time spent in minutes: 35  Velvet Bathe, MD [facility] Pager: [email or web address] Password [facility] 09/20/2016, 4:52 PM   Addendum: Called to update daughter regarding update. Went over urine and blood cultures. She asked whether the blood culture was growing the same thing as in her past. I told her I could look at that next am for her as right now I did not have time to do that. She did not like the answer and hung up on me.  Dorota Heinrichs, [employer]

## 2016-09-20 NOTE — Progress Notes (Signed)
Patients last bowel movement was 09/17/16. Requested order for miralax from provider.

## 2016-09-20 NOTE — Care Management Note (Signed)
Case Management Note  Patient Details  Name: [name] [name] MRN: [id number] Date of Birth: [birth year]  Subjective/Objective:      Pt admitted with  AMS            Action/Plan:   PTA from [facility]  Pt is already active with [facility] - CM contacted agency and informed of order for [facility] PT,OT and aide - agency accepts referral.  Pt declined DME as recommended  Expected Discharge Date:                  Expected Discharge Plan:  [place]  In-House Referral:     Discharge planning Services  CM Consult  Post Acute Care Choice:    Choice offered to:  Patient  DME Arranged:    DME Agency:     HH Arranged:  PT, OT, Nurse's Aide [place] Agency:  [place]  Status of Service:  In process, will continue to follow  If discussed at Long Length of Stay Meetings, dates discussed:    Additional Comments:  Maryclare Labrador, RN 09/20/2016, 10:48 AM

## 2016-09-20 NOTE — Care Management Important Message (Signed)
Important Message  Patient Details  Name: [name] [name] MRN: [id number] Date of Birth: 1946/01/05   Medicare Important Message Given:       Maryclare Labrador, RN 09/20/2016, 10:34 AM

## 2016-09-20 NOTE — Progress Notes (Signed)
ANTICOAGULATION CONSULT NOTE - Follow Up Consult  Pharmacy Consult for warfarin Indication: hx PE, on coumadin PTA  Allergies  Allergen Reactions  . Crestor [Rosuvastatin Calcium] Other (See Comments)    Feeling very bad, aching all over.  . Erythromycin Hives  . Gabapentin Other (See Comments)    Blurred vision  . Pregabalin Other (See Comments)    Crossed vision.  . Carbidopa Other (See Comments)    Headaches, nausea, dizziness  . Macrobid [Nitrofurantoin] Hives  . Losartan Other (See Comments)    Patient Measurements: Height: 5\' 6"  (167.6 cm) Weight: 190 lb (86.2 kg) IBW/kg (Calculated) : 59.3  Vital Signs: Temp: 98.2 F (36.8 C) (07/18 0558) BP: 141/64 (07/18 0558) Pulse Rate: 78 (07/18 0558)  Labs:  Recent Labs  09/18/16 0607 09/19/16 0553 09/19/16 0825 09/20/16 0405  HGB 13.1  --  12.4 11.8*  HCT 40.2  --  38.3 37.1  PLT 271  --  211 215  LABPROT 28.2* 27.1*  --  24.3*  INR 2.59 2.46  --  2.14  CREATININE 0.72  --  0.58 0.58    Estimated Creatinine Clearance: 69.3 mL/min (by C-G formula based on SCr of 0.58 mg/dL).    Assessment: 71 yo female on warfarin PTA for history of PE. Home dose is 4 mg daily except 2 mg on Saturdays per MD documentation 09/07/16. INR trending down but remains therapeutic at 2.14. HGB declining 13.1>11.8, PLT stable. No overt s/s bleeding noted. Given down trending INR on PTA regimen, warfarin dose increase is warranted.  Goal of Therapy:  INR 2-3 Monitor platelets by anticoagulation protocol: Yes   Plan:  Give warfarin 5 mg po x 1 Monitor daily INR, CBC, clinical course, s/sx of bleed, PO intake, DDI   Thank you for allowing [name] to participate in this patients care.   Charlene Brooke, PharmD PGY1 [place] Resident Pager: [phone] If after 3:30p, please call main pharmacy at: [phone] 09/20/2016 1:40 PM   I discussed / reviewed the pharmacy note by Dr. Lenord Fellers and I agree with the resident's findings and plans as  documented.  [employer], Pharm.D., BCPS Clinical Pharmacist Pager: [phone] Clinical phone for 09/20/2016 from 8:30-4:00 is [phone]. After 4pm, please call Main Rx [phone]) for assistance. 09/20/2016 2:06 PM

## 2016-09-20 NOTE — Progress Notes (Signed)
Text page to provider: [name] [phone]- would like a call for updates please, I gave son in law verbal update but they would like a call.

## 2016-09-20 NOTE — Progress Notes (Signed)
Occupational Therapy Treatment Patient Details Name: [name] [name] MRN: [id number] DOB: 18-Jan-1946 Today's Date: 09/20/2016    History of present illness Laurel Harnden is a 71 y.o. female with medical history significant of hypertension, hyperlipidemia, hypothyroidism, depression, PVD, Parkinson's disease fibromyalgia, hypercoagulable state, PE on Coumadin, mild dementia, who presents with right foot pain, altered mental status and fall. found to have sepsis related to UTI and R foot cellulitits   OT comments  Pt with improved activity tolerance. Needing assistance for mobility primarily for safety due to IV line. Performed standing grooming and modified independently. Pt reports intermittent diplopia at home, none currently. Uses a sock aide at home.  Follow Up Recommendations  Home health OT;Supervision - Intermittent    Equipment Recommendations  None recommended by OT    Recommendations for Other Services      Precautions / Restrictions Precautions Precautions: Fall Restrictions Weight Bearing Restrictions: No       Mobility Bed Mobility Overal bed mobility: Modified Independent             General bed mobility comments: toward L side  Transfers Overall transfer level: Needs assistance Equipment used: Rolling walker (2 wheeled) Transfers: Sit to/from Stand Sit to Stand: Modified independent (Device/Increase time)         General transfer comment: from bed and 3 in 1, good hand placement    Balance Overall balance assessment: Needs assistance   Sitting balance-Leahy Scale: Good     Standing balance support: During functional activity Standing balance-Leahy Scale: Poor Standing balance comment: requires at least one hand on surface to maintain balance                           ADL either performed or assessed with clinical judgement   ADL Overall ADL's : Needs assistance/impaired     Grooming: Wash/dry hands;Wash/dry face;Oral  care;Standing;Modified independent       Lower Body Bathing: Modified independent;Sit to/from stand Lower Body Bathing Details (indicate cue type and reason): periarea     Lower Body Dressing: Maximal assistance Lower Body Dressing Details (indicate cue type and reason): for socks, pt reports using a sock aide for socks Toilet Transfer: Supervision/safety;Ambulation;BSC;RW   Toileting- Clothing Manipulation and Hygiene: Modified independent;Sit to/from stand Toileting - Clothing Manipulation Details (indicate cue type and reason): no difficulty with pericare, stabilized on walker with one hand     Functional mobility during ADLs: Supervision/safety;Rolling walker (mostly for IV line)       Vision       Perception     Praxis      Cognition Arousal/Alertness: Awake/alert Behavior During Therapy: WFL for tasks assessed/performed Overall Cognitive Status: Impaired/Different from baseline Area of Impairment: Safety/judgement;Memory                     Memory: Decreased short-term memory   Safety/Judgement: Decreased awareness of safety              Exercises     Shoulder Instructions       General Comments      Pertinent Vitals/ Pain       Pain Assessment: No/denies pain  Home Living                                          Prior Functioning/Environment  Frequency  Min 3X/week        Progress Toward Goals  OT Goals(current goals can now be found in the care plan section)  Progress towards OT goals: Progressing toward goals  Acute Rehab OT Goals Patient Stated Goal: get better and get home soon OT Goal Formulation: With patient Time For Goal Achievement: 10/02/16 Potential to Achieve Goals: Good  Plan Discharge plan remains appropriate    Co-evaluation                 AM-PAC PT "6 Clicks" Daily Activity     Outcome Measure   Help from another person eating meals?: None Help from another  person taking care of personal grooming?: None Help from another person toileting, which includes using toliet, bedpan, or urinal?: A Little Help from another person bathing (including washing, rinsing, drying)?: None Help from another person to put on and taking off regular upper body clothing?: None Help from another person to put on and taking off regular lower body clothing?: A Little 6 Click Score: 22    End of Session Equipment Utilized During Treatment: Gait belt;Rolling walker  OT Visit Diagnosis: Unsteadiness on feet (R26.81)   Activity Tolerance Patient tolerated treatment well   Patient Left in chair;with call bell/phone within reach;with chair alarm set   Nurse Communication          Time: 3614-4315 OT Time Calculation (min): 28 min  Charges: OT General Charges $OT Visit: 1 Procedure OT Treatments $Self Care/Home Management : 23-37 mins    Malka So 09/20/2016, 1:53 PM [phone]

## 2016-09-21 DIAGNOSIS — I10 Essential (primary) hypertension: Secondary | ICD-10-CM

## 2016-09-21 LAB — GLUCOSE, CAPILLARY: GLUCOSE-CAPILLARY: 119 mg/dL — AB (ref 65–99)

## 2016-09-21 LAB — PROTIME-INR
INR: 2.54
Prothrombin Time: 27.8 seconds — ABNORMAL HIGH (ref 11.4–15.2)

## 2016-09-21 MED ORDER — WARFARIN SODIUM 4 MG PO TABS
4.0000 mg | ORAL_TABLET | Freq: Once | ORAL | Status: AC
  Administered 2016-09-21: 4 mg via ORAL
  Filled 2016-09-21: qty 1

## 2016-09-21 NOTE — Progress Notes (Signed)
Attempted to call [name] (daughter) for an update per her request but no answer.

## 2016-09-21 NOTE — Progress Notes (Signed)
Physical Therapy Treatment Patient Details Name: [name] [name] MRN: [id number] DOB: 1945/06/09 Today's Date: 09/21/2016    History of Present Illness [name] [name] is a 71 y.o. female with medical history significant of hypertension, hyperlipidemia, hypothyroidism, depression, PVD, Parkinson's disease fibromyalgia, hypercoagulable state, PE on Coumadin, mild dementia, who presents with right foot pain, altered mental status and fall. found to have sepsis related to UTI and R foot cellulitits    PT Comments    Patient doing well today - good motivation and tolerance to PT treatment. Patient does continue to require consistent VC for safety with all transfers and mobility for overall safety. Improve gait distance this visit with inconsistent gait speeds, and much reduced gait speed with increased fatigue. Will continue to follow acutely to maximize functional mobility prior to discharge.     Follow Up Recommendations  Home health PT     Equipment Recommendations       Recommendations for Other Services       Precautions / Restrictions Precautions Precautions: Fall Precaution Comments: pt needing cues to use walker, reports frequently forgetting Restrictions Weight Bearing Restrictions: No    Mobility  Bed Mobility Overal bed mobility: Modified Independent Bed Mobility: Supine to Sit     Supine to sit: Supervision     General bed mobility comments: toward L side  Transfers Overall transfer level: Needs assistance Equipment used: Rolling walker (2 wheeled) Transfers: Sit to/from Stand Sit to Stand: Min guard         General transfer comment: from bed; good immediate standing balance; VC for general safety  Ambulation/Gait Ambulation/Gait assistance: Min guard Ambulation Distance (Feet): 160 Feet Assistive device: Rolling walker (2 wheeled) Gait Pattern/deviations: Step-through pattern;Decreased stride length     General Gait Details: intermittent VC to remain  close to walker for safety; some slowing and speeding up of gait speed throughout activity - no consistency   Stairs            Wheelchair Mobility    Modified Rankin (Stroke Patients Only)       Balance Overall balance assessment: Needs assistance   Sitting balance-Leahy Scale: Good     Standing balance support: During functional activity   Standing balance comment: use of RW and sink for balance. Able to steady herself during hand washing at sink.                             Cognition Arousal/Alertness: Awake/alert Behavior During Therapy: WFL for tasks assessed/performed Overall Cognitive Status: Impaired/Different from baseline Area of Impairment: Safety/judgement;Memory                     Memory: Decreased short-term memory   Safety/Judgement: Decreased awareness of safety            Exercises      General Comments        Pertinent Vitals/Pain Pain Assessment: No/denies pain    Home Living                      Prior Function            PT Goals (current goals can now be found in the care plan section) Acute Rehab PT Goals Patient Stated Goal: get better and get home soon    Frequency    Min 3X/week      PT Plan      Co-evaluation  AM-PAC PT "6 Clicks" Daily Activity  Outcome Measure  Difficulty turning over in bed (including adjusting bedclothes, sheets and blankets)?: None Difficulty moving from lying on back to sitting on the side of the bed? : A Little Difficulty sitting down on and standing up from a chair with arms (e.g., wheelchair, bedside commode, etc,.)?: A Little Help needed moving to and from a bed to chair (including a wheelchair)?: A Little Help needed walking in hospital room?: A Little Help needed climbing 3-5 steps with a railing? : A Lot 6 Click Score: 18    End of Session Equipment Utilized During Treatment: Gait belt Activity Tolerance: Patient tolerated  treatment well Patient left: in chair;with call bell/phone within reach;with chair alarm set   PT Visit Diagnosis: Unsteadiness on feet (R26.81);Pain     Time: 7847-8412 PT Time Calculation (min) (ACUTE ONLY): 29 min  Charges:  $Gait Training: 8-22 mins $Therapeutic Activity: 8-22 mins                    G Codes:       Lanney Gins, PT, DPT 09/21/16 10:44 AM

## 2016-09-21 NOTE — Consult Note (Signed)
   [facility] CM Inpatient Consult   09/21/2016  [name] [name] 01-09-1946 [id number]   Patient was assessed for Medicare ACO admission and ED visits.  Patient is a 71 year old with a HX of mild dementia with altered mental status and falls with UTI sepsis and right foot cellulitis.  Patient was residing a [facility] and active with Home health care with [facility] per notes. Current PT evaluation is recommending return to Independent living with continued home health.  Call placed to [place] with [facility] for further assessment for [facility].  Will follow for additional needs for disposition.  Natividad Brood, RN BSN [facility] Liaison  [phone] business mobile phone Toll free office [phone]

## 2016-09-21 NOTE — Progress Notes (Signed)
PROGRESS NOTE   [name] [name]   QVZ:[id number]  DOB: 11-26-1945  DOA: 09/17/2016 PCP: Darreld Mclean, MD   Brief Narrative:  [name] [name] is a 71 y.o. female who lives in independent living and has a h/o HTN, HLD, Parkinson's disease, fibromyalgia, hypercoagulable state, PE on Coumadin, PVD, depression who is brought in her her daughter for right foot bruising, redness and swelling. She does not remember injuring it. The toes are bruised and there is a raised red area about 2 '' in diameter on the dorsum of the foot which is tender. She has been walking on it but it is painful.   She presented with confusion and fever and found to have UTI. Reportedly in history has had frequent UTIs.   Subjective: Patient feels better today.  Assessment & Plan:   Principal Problem:   Severe Sepsis due to E coli UTI and bactermia - E coli in the past and as well as ESBL in Feb of this year - Blood cultures 1/2 sets growing E coli- change Rocephin to Imipenem - sensitivities still pending. Day 4 of 14 - u culture - 30,000 colonies of E coli  Active Problems:   Acute metabolic encephalopathy - Suspect patient at baseline. Secondary to infectious etiology  Injury of right foot - no fracture noted on imaging- does not appear to be cellulitis  - apply ice and elevate - Recommend dermatology consult on discharge. If it were infection patient is going to be on 14 days of antibiotics  Hypokalemia - After replacement within normal limits    History of pulmonary embolism/ hypercoagulable state - Pharmacy dosing Coumadin    Parkinson's disease  - Sinemet    Benign essential HTN - Continue pt on Avapro    Hyperlipidemia -  Pt on lipitor - no chest pain    Hypothyroidism - Stable on Synthroid  Depression - Stable on Cymbalta  B12 deficiency - on B12 supplements  DVT prophylaxis: Warfarin Code Status: DNR Family Communication: The patient stated she would update her family  herself Disposition Plan: PT eval >> HHPT and OT with intermittent supervision when stable. Added H/H: RN  Consultants:   None   Procedures:  none  Antimicrobials:  Anti-infectives    Start     Dose/Rate Route Frequency Ordered Stop   09/18/16 2000  cefTRIAXone (ROCEPHIN) 1 g in dextrose 5 % 50 mL IVPB  Status:  Discontinued     1 g 100 mL/hr over 30 Minutes Intravenous Every 24 hours 09/17/16 2116 09/18/16 1042   09/18/16 1445  meropenem (MERREM) 1 g in sodium chloride 0.9 % 100 mL IVPB     1 g 200 mL/hr over 30 Minutes Intravenous Every 8 hours 09/18/16 1432     09/17/16 2045  cefTRIAXone (ROCEPHIN) 1 g in dextrose 5 % 50 mL IVPB     1 g 100 mL/hr over 30 Minutes Intravenous  Once 09/17/16 2044 09/17/16 2121       Objective: Vitals:   09/20/16 1517 09/20/16 2211 09/21/16 0547 09/21/16 1445  BP: 140/71 (!) 149/62 (!) 144/76 (!) 139/57  Pulse: 90 83 66   Resp: (!) 24 18 18 18   Temp: 97.8 F (36.6 C) 98 F (36.7 C) 98.3 F (36.8 C) 98.1 F (36.7 C)  TempSrc: Oral     SpO2: 97% 100% 100%   Weight:      Height:        Intake/Output Summary (Last 24 hours) at 09/21/16 1627 Last data filed  at 09/21/16 1533  Gross per 24 hour  Intake           1837.5 ml  Output             1000 ml  Net            837.5 ml   Filed Weights   09/17/16 1925  Weight: 86.2 kg (190 lb)    Examination: General exam: Awake and alert, in nad. HEENT: PERRLA, oral mucosa moist, no sclera icterus or thrush, supple Respiratory system: equal chest rise,  No wheezes, no rhales Cardiovascular system: S1 & S2 heard, RRR.  No murmurs  Gastrointestinal system: Abdomen soft, non-tender, nondistended. Normal bowel sound. No organomegaly Central nervous system: Alert and oriented. No focal neurological deficits. Extremities: No cyanosis, clubbing - mild edema of right foot Skin: No rashes or ulcers- bruising on right toes with erythematosus tender area on dorsum about 2'' in diameter. Psychiatry:   Mood & affect appropriate.   Data Reviewed: I have personally reviewed following labs and imaging studies  CBC:  Recent Labs Lab 09/17/16 1954 09/18/16 0607 09/19/16 0825 09/20/16 0405  WBC 17.5* 18.6* 10.6* 5.3  NEUTROABS 14.4*  --   --   --   HGB 14.1 13.1 12.4 11.8*  HCT 42.1 40.2 38.3 37.1  MCV 85.6 86.3 85.9 86.3  PLT 273 271 211 703   Basic Metabolic Panel:  Recent Labs Lab 09/17/16 1954 09/18/16 0607 09/18/16 0900 09/19/16 0825 09/20/16 0405  NA 133* 139  --  138 140  K 3.8 3.4*  --  4.1 4.0  CL 99* 106  --  109 110  CO2 23 26  --  20* 24  GLUCOSE 181* 144*  --  123* 122*  BUN 11 11  --  9 8  CREATININE 0.83 0.72  --  0.58 0.58  CALCIUM 9.2 8.7*  --  8.7* 8.7*  MG  --   --  1.9  --   --    GFR: Estimated Creatinine Clearance: 69.3 mL/min (by C-G formula based on SCr of 0.58 mg/dL). Liver Function Tests:  Recent Labs Lab 09/17/16 1954  AST 32  ALT 26  ALKPHOS 120  BILITOT 1.2  PROT 6.9  ALBUMIN 3.5   No results for input(s): LIPASE, AMYLASE in the last 168 hours. No results for input(s): AMMONIA in the last 168 hours. Coagulation Profile:  Recent Labs Lab 09/17/16 2342 09/18/16 0607 09/19/16 0553 09/20/16 0405 09/21/16 0251  INR 2.83 2.59 2.46 2.14 2.54   Cardiac Enzymes: No results for input(s): CKTOTAL, CKMB, CKMBINDEX, TROPONINI in the last 168 hours. BNP (last 3 results) No results for input(s): PROBNP in the last 8760 hours. HbA1C: No results for input(s): HGBA1C in the last 72 hours. CBG:  Recent Labs Lab 09/18/16 0825 09/19/16 0801 09/20/16 0807 09/21/16 0800  GLUCAP 130* 113* 117* 119*   Lipid Profile: No results for input(s): CHOL, HDL, LDLCALC, TRIG, CHOLHDL, LDLDIRECT in the last 72 hours. Thyroid Function Tests: No results for input(s): TSH, T4TOTAL, FREET4, T3FREE, THYROIDAB in the last 72 hours. Anemia Panel: No results for input(s): VITAMINB12, FOLATE, FERRITIN, TIBC, IRON, RETICCTPCT in the last 72  hours. Urine analysis:    Component Value Date/Time   COLORURINE YELLOW 09/17/2016 1942   APPEARANCEUR CLOUDY (A) 09/17/2016 1942   LABSPEC 1.013 09/17/2016 1942   PHURINE 6.0 09/17/2016 1942   GLUCOSEU NEGATIVE 09/17/2016 1942   HGBUR MODERATE (A) 09/17/2016 1942   BILIRUBINUR NEGATIVE 09/17/2016 1942  BILIRUBINUR negative 06/27/2016 [place] 09/17/2016 1942   PROTEINUR 100 (A) 09/17/2016 1942   UROBILINOGEN negative (A) 06/27/2016 1222   UROBILINOGEN 0.2 09/07/2012 1049   NITRITE POSITIVE (A) 09/17/2016 1942   LEUKOCYTESUR LARGE (A) 09/17/2016 1942   Sepsis Labs: @LABRCNTIP (procalcitonin:4,lacticidven:4) ) Recent Results (from the past 240 hour(s))  Urine Culture     Status: Abnormal   Collection Time: 09/17/16  7:40 PM  Result Value Ref Range Status   Specimen Description URINE, CATHETERIZED  Final   Special Requests NONE  Final   Culture (A)  Final    30,000 COLONIES/mL ESCHERICHIA COLI Confirmed Extended Spectrum Beta-Lactamase Producer (ESBL)    Report Status 09/20/2016 FINAL  Final   Organism ID, Bacteria ESCHERICHIA COLI (A)  Final      Susceptibility   Escherichia coli - MIC*    AMPICILLIN >=32 RESISTANT Resistant     CEFAZOLIN >=64 RESISTANT Resistant     CEFTRIAXONE >=64 RESISTANT Resistant     CIPROFLOXACIN >=4 RESISTANT Resistant     GENTAMICIN <=1 SENSITIVE Sensitive     IMIPENEM <=0.25 SENSITIVE Sensitive     NITROFURANTOIN <=16 SENSITIVE Sensitive     TRIMETH/SULFA >=320 RESISTANT Resistant     AMPICILLIN/SULBACTAM >=32 RESISTANT Resistant     PIP/TAZO <=4 SENSITIVE Sensitive     Extended ESBL POSITIVE Resistant     * 30,000 COLONIES/mL ESCHERICHIA COLI  Blood Culture (routine x 2)     Status: Abnormal   Collection Time: 09/17/16  7:48 PM  Result Value Ref Range Status   Specimen Description BLOOD RIGHT ANTECUBITAL  Final   Special Requests   Final    BOTTLES DRAWN AEROBIC AND ANAEROBIC Blood Culture adequate volume   Culture   Setup Time   Final    GRAM NEGATIVE RODS IN BOTH AEROBIC AND ANAEROBIC BOTTLES CRITICAL RESULT CALLED TO, READ BACK BY AND VERIFIED WITH: E MARTIN,PHARMD AT 1035 09/18/16 BY L BENFIELD    Culture (A)  Final    ESCHERICHIA COLI Confirmed Extended Spectrum Beta-Lactamase Producer (ESBL)    Report Status 09/20/2016 FINAL  Final   Organism ID, Bacteria ESCHERICHIA COLI  Final      Susceptibility   Escherichia coli - MIC*    AMPICILLIN >=32 RESISTANT Resistant     CEFAZOLIN >=64 RESISTANT Resistant     CEFEPIME >=64 RESISTANT Resistant     CEFTAZIDIME RESISTANT Resistant     CEFTRIAXONE >=64 RESISTANT Resistant     CIPROFLOXACIN >=4 RESISTANT Resistant     GENTAMICIN <=1 SENSITIVE Sensitive     IMIPENEM <=0.25 SENSITIVE Sensitive     TRIMETH/SULFA >=320 RESISTANT Resistant     AMPICILLIN/SULBACTAM >=32 RESISTANT Resistant     PIP/TAZO <=4 SENSITIVE Sensitive     Extended ESBL POSITIVE Resistant     * ESCHERICHIA COLI  Blood Culture ID Panel (Reflexed)     Status: Abnormal   Collection Time: 09/17/16  7:48 PM  Result Value Ref Range Status   Enterococcus species NOT DETECTED NOT DETECTED Final   Listeria monocytogenes NOT DETECTED NOT DETECTED Final   Staphylococcus species NOT DETECTED NOT DETECTED Final   Staphylococcus aureus NOT DETECTED NOT DETECTED Final   Streptococcus species NOT DETECTED NOT DETECTED Final   Streptococcus agalactiae NOT DETECTED NOT DETECTED Final   Streptococcus pneumoniae NOT DETECTED NOT DETECTED Final   Streptococcus pyogenes NOT DETECTED NOT DETECTED Final   Acinetobacter baumannii NOT DETECTED NOT DETECTED Final   Enterobacteriaceae species DETECTED (A) NOT DETECTED Final  Comment: Enterobacteriaceae represent a large family of gram-negative bacteria, not a single organism. CRITICAL RESULT CALLED TO, READ BACK BY AND VERIFIED WITH: E MARTIN,PHARMD AT 1035 09/18/16 BY L BENFIELD    Enterobacter cloacae complex NOT DETECTED NOT DETECTED Final    Escherichia coli DETECTED (A) NOT DETECTED Final    Comment: CRITICAL RESULT CALLED TO, READ BACK BY AND VERIFIED WITH: E MARTIN,PHARMD AT 1035 09/18/16 BY L BENFIELD    Klebsiella oxytoca NOT DETECTED NOT DETECTED Final   Klebsiella pneumoniae NOT DETECTED NOT DETECTED Final   Proteus species NOT DETECTED NOT DETECTED Final   Serratia marcescens NOT DETECTED NOT DETECTED Final   Carbapenem resistance NOT DETECTED NOT DETECTED Final   Haemophilus influenzae NOT DETECTED NOT DETECTED Final   Neisseria meningitidis NOT DETECTED NOT DETECTED Final   Pseudomonas aeruginosa NOT DETECTED NOT DETECTED Final   Candida albicans NOT DETECTED NOT DETECTED Final   Candida glabrata NOT DETECTED NOT DETECTED Final   Candida krusei NOT DETECTED NOT DETECTED Final   Candida parapsilosis NOT DETECTED NOT DETECTED Final   Candida tropicalis NOT DETECTED NOT DETECTED Final  Blood Culture (routine x 2)     Status: None (Preliminary result)   Collection Time: 09/17/16  7:54 PM  Result Value Ref Range Status   Specimen Description BLOOD RIGHT ARM  Final   Special Requests   Final    BOTTLES DRAWN AEROBIC AND ANAEROBIC Blood Culture adequate volume   Culture NO GROWTH 4 DAYS  Final   Report Status PENDING  Incomplete  MRSA PCR Screening     Status: None   Collection Time: 09/18/16  7:59 AM  Result Value Ref Range Status   MRSA by PCR NEGATIVE NEGATIVE Final    Comment:        The GeneXpert MRSA Assay (FDA approved for NASAL specimens only), is one component of a comprehensive MRSA colonization surveillance program. It is not intended to diagnose MRSA infection nor to guide or monitor treatment for MRSA infections.          Radiology Studies: No results found.    Scheduled Meds: . atorvastatin  10 mg Oral Daily  . Carbidopa-Levodopa ER  1 tablet Oral TID  . celecoxib  100 mg Oral BID  . cholecalciferol  5,000 Units Oral Daily  . cyanocobalamin  500 mcg Oral Daily  . DULoxetine  60 mg  Oral Daily  . fluticasone  1 spray Each Nare Daily  . irbesartan  150 mg Oral Daily  . levothyroxine  150 mcg Oral QAC breakfast  . loratadine  10 mg Oral Daily  . magnesium oxide  200 mg Oral Daily  . omega-3 acid ethyl esters  1 g Oral Daily  . sodium chloride flush  3 mL Intravenous Q12H  . warfarin  4 mg Oral ONCE-1800  . Warfarin - Pharmacist Dosing Inpatient   Does not apply q1800   Continuous Infusions: . sodium chloride 75 mL/hr at 09/21/16 0330  . meropenem (MERREM) IV Stopped (09/21/16 0602)  . sodium chloride       LOS: 4 days    Time spent in minutes: 35  Velvet Bathe, MD [facility] Pager: [email or web address] Password [facility] 09/21/2016, 4:27 PM

## 2016-09-21 NOTE — Progress Notes (Signed)
Pharmacy Antibiotic Note  [name] [name] is a 71 y.o. female admitted on 09/17/2016 with bacteremia.  Pharmacy has been consulted for Merrem dosing.  Due to ESBL bacteremia and cx sensitivities, anticipate pt to require home infusion.  Renal function stable and improving, no adjustments necessary at this time.  Plan: Merrem 1g IV q 8h  Height: 5\' 6"  (167.6 cm) Weight: 190 lb (86.2 kg) IBW/kg (Calculated) : 59.3  Temp (24hrs), Avg:98 F (36.7 C), Min:97.8 F (36.6 C), Max:98.3 F (36.8 C)   Recent Labs Lab 09/17/16 1954 09/17/16 2008 09/17/16 2342 09/18/16 0607 09/19/16 0825 09/20/16 0405  WBC 17.5*  --   --  18.6* 10.6* 5.3  CREATININE 0.83  --   --  0.72 0.58 0.58  LATICACIDVEN  --  2.11* 2.2* 1.2  --   --     Estimated Creatinine Clearance: 69.3 mL/min (by C-G formula based on SCr of 0.58 mg/dL).    Allergies  Allergen Reactions  . Crestor [Rosuvastatin Calcium] Other (See Comments)    Feeling very bad, aching all over.  . Erythromycin Hives  . Gabapentin Other (See Comments)    Blurred vision  . Pregabalin Other (See Comments)    Crossed vision.  . Carbidopa Other (See Comments)    Headaches, nausea, dizziness  . Macrobid [Nitrofurantoin] Hives  . Losartan Other (See Comments)    Antimicrobials this admission: Rocephin 7/15 > 7/16 Merrem 7/16 >  Dose adjustments this admission:   Microbiology results: 7/15 UTI >>ESBL 30,000 CFU 7/15 blood x 2 >> 1/2 ESBL  7/15 BCID = ESBL Ecoli 7/16 MRSA PCR negative   Bertis Ruddy, PharmD Pharmacy Resident Pager #: [phone] 09/21/2016 10:37 AM

## 2016-09-21 NOTE — Progress Notes (Signed)
ANTICOAGULATION CONSULT NOTE - Follow Up Consult  Pharmacy Consult for warfarin Indication: hx PE, on coumadin PTA  Allergies  Allergen Reactions  . Crestor [Rosuvastatin Calcium] Other (See Comments)    Feeling very bad, aching all over.  . Erythromycin Hives  . Gabapentin Other (See Comments)    Blurred vision  . Pregabalin Other (See Comments)    Crossed vision.  . Carbidopa Other (See Comments)    Headaches, nausea, dizziness  . Macrobid [Nitrofurantoin] Hives  . Losartan Other (See Comments)    Patient Measurements: Height: 5\' 6"  (167.6 cm) Weight: 190 lb (86.2 kg) IBW/kg (Calculated) : 59.3  Vital Signs: Temp: 98.3 F (36.8 C) (07/19 0547) BP: 144/76 (07/19 0547) Pulse Rate: 66 (07/19 0547)  Labs:  Recent Labs  09/19/16 0553 09/19/16 0825 09/20/16 0405 09/21/16 0251  HGB  --  12.4 11.8*  --   HCT  --  38.3 37.1  --   PLT  --  211 215  --   LABPROT 27.1*  --  24.3* 27.8*  INR 2.46  --  2.14 2.54  CREATININE  --  0.58 0.58  --     Estimated Creatinine Clearance: 69.3 mL/min (by C-G formula based on SCr of 0.58 mg/dL).    Assessment: 71 yo female on warfarin PTA for history of PE. Home dose is 4 mg daily except 2 mg on Saturdays per MD documentation 09/07/16. INR trending back up from 2.14 to 2.54 remaining therapeutic. HGB declining 13.1>11.8, PLT stable. No overt s/s bleeding noted (CBC ordered for 7/20).  Warfarin decreased back to home dose after INR reflecting 5mg  dose and comfortably within therapeutic range.  Goal of Therapy:  INR 2-3 Monitor platelets by anticoagulation protocol: Yes   Plan:  Give warfarin 4mg  PO x 1 dose Monitor daily INR, CBC, clinical course, s/sx of bleed, PO intake, DDI   Bertis Ruddy, PharmD Pharmacy Resident Pager #: [phone] 09/21/2016 10:32 AM

## 2016-09-22 LAB — BASIC METABOLIC PANEL
ANION GAP: 4 — AB (ref 5–15)
BUN: 8 mg/dL (ref 6–20)
CALCIUM: 8.7 mg/dL — AB (ref 8.9–10.3)
CHLORIDE: 109 mmol/L (ref 101–111)
CO2: 28 mmol/L (ref 22–32)
Creatinine, Ser: 0.64 mg/dL (ref 0.44–1.00)
GFR calc non Af Amer: >60 mL/min (ref >60)
Glucose, Bld: 109 mg/dL — ABNORMAL HIGH (ref 65–99)
Potassium: 4.3 mmol/L (ref 3.5–5.1)
SODIUM: 141 mmol/L (ref 135–145)

## 2016-09-22 LAB — CBC
HEMATOCRIT: 36.9 % (ref 36.0–46.0)
Hemoglobin: 11.8 g/dL — ABNORMAL LOW (ref 12.0–15.0)
MCH: 27.5 pg (ref 26.0–34.0)
MCHC: 32 g/dL (ref 30.0–36.0)
MCV: 86 fL (ref 78.0–100.0)
Platelets: 272 10*3/uL (ref 150–400)
RBC: 4.29 MIL/uL (ref 3.87–5.11)
RDW: 13.1 % (ref 11.5–15.5)
WBC: 7.1 10*3/uL (ref 4.0–10.5)

## 2016-09-22 LAB — CULTURE, BLOOD (ROUTINE X 2)
Culture: NO GROWTH
SPECIAL REQUESTS: ADEQUATE

## 2016-09-22 LAB — PROTIME-INR
INR: 2.57
Prothrombin Time: 28.1 seconds — ABNORMAL HIGH (ref 11.4–15.2)

## 2016-09-22 MED ORDER — ERTAPENEM SODIUM 1 G IJ SOLR
1.0000 g | Freq: Once | INTRAMUSCULAR | Status: AC
  Administered 2016-09-23: 1 g via INTRAVENOUS
  Filled 2016-09-22: qty 1

## 2016-09-22 MED ORDER — ERTAPENEM IV (FOR PTA / DISCHARGE USE ONLY): 1.0000 g | INTRAVENOUS | 0 refills | Status: DC

## 2016-09-22 MED ORDER — WARFARIN SODIUM 4 MG PO TABS
4.0000 mg | ORAL_TABLET | Freq: Once | ORAL | Status: AC
  Administered 2016-09-22: 4 mg via ORAL
  Filled 2016-09-22: qty 1

## 2016-09-22 NOTE — Progress Notes (Signed)
ANTICOAGULATION CONSULT NOTE - Follow Up Consult  Pharmacy Consult for warfarin Indication: hx PE, on coumadin PTA  Allergies  Allergen Reactions  . Crestor [Rosuvastatin Calcium] Other (See Comments)    Feeling very bad, aching all over.  . Erythromycin Hives  . Gabapentin Other (See Comments)    Blurred vision  . Pregabalin Other (See Comments)    Crossed vision.  . Carbidopa Other (See Comments)    Headaches, nausea, dizziness  . Macrobid [Nitrofurantoin] Hives  . Losartan Other (See Comments)    Patient Measurements: Height: 5\' 6"  (167.6 cm) Weight: 190 lb (86.2 kg) IBW/kg (Calculated) : 59.3  Vital Signs: Temp: 97.7 F (36.5 C) (07/20 0630) Temp Source: Oral (07/20 0630) BP: 146/71 (07/20 0630) Pulse Rate: 71 (07/20 0630)  Labs:  Recent Labs  09/20/16 0405 09/21/16 0251 09/22/16 0446  HGB 11.8*  --  11.8*  HCT 37.1  --  36.9  PLT 215  --  272  LABPROT 24.3* 27.8* 28.1*  INR 2.14 2.54 2.57  CREATININE 0.58  --  0.64    Estimated Creatinine Clearance: 69.3 mL/min (by C-G formula based on SCr of 0.64 mg/dL).    Assessment: 71 yo female on warfarin PTA for history of PE. Home dose is 4 mg daily except 2 mg on Saturdays per MD documentation 09/07/16. INR therapeutic at 2.57. CBC stable.  No overt s/s bleeding noted.   Goal of Therapy:  INR 2-3 Monitor platelets by anticoagulation protocol: Yes   Plan:  Warfarin 4 mg po x 1 Monitor daily INR, CBC, clinical course, s/sx of bleed, PO intake, DDI   Thank you for allowing [name] to participate in this patients care.  [employer], Pharm.D., BCPS Clinical Pharmacist Pager: [phone] Clinical phone for 09/22/2016 from 8:30-4:00 is [phone]. After 4pm, please call Main Rx [phone]) for assistance. 09/22/2016 11:46 AM

## 2016-09-22 NOTE — Progress Notes (Signed)
PROGRESS NOTE   [name] [name]   FIE:[id number]  DOB: [birth year]  DOA: 09/17/2016 PCP: Darreld Mclean, MD   Brief Narrative:  [name] [name] is a 71 y.o. female who lives in independent living and has a h/o HTN, HLD, Parkinson's disease, fibromyalgia, hypercoagulable state, PE on Coumadin, PVD, depression who is brought in her her daughter for right foot bruising, redness and swelling. She does not remember injuring it. The toes are bruised and there is a raised red area about 2 '' in diameter on the dorsum of the foot which is tender. She has been walking on it but it is painful.   She presented with confusion and fever and found to have UTI. Reportedly in history has had frequent UTIs.   Subjective: Patient feels better today.  Assessment & Plan:   Principal Problem:   Severe Sepsis due to E coli UTI and bactermia - E coli in the past and as well as ESBL in Feb of this year - Blood cultures 1/2 sets growing E coli- change Rocephin to Imipenem and on discharge will change to ertapenem for dosing simplicity-  Day 5 of 14 - u culture - 30,000 colonies of E coli  Active Problems:   Acute metabolic encephalopathy - Suspect patient at baseline. Was secondary to infectious etiology  Injury of right foot - no fracture noted on imaging- does not appear to be cellulitis  - apply ice and elevate - Recommend dermatology consult on discharge. If it were infection patient is going to be on 14 days of antibiotics  Hypokalemia - After replacement within normal limits    History of pulmonary embolism/ hypercoagulable state - Pharmacy dosing Coumadin    Parkinson's disease  - Stable on Sinemet    Benign essential HTN - Continue pt on Avapro    Hyperlipidemia -  Pt on lipitor - no chest pain    Hypothyroidism - Stable continue current regimen  Depression - Stable continue current regimen  B12 deficiency - on B12 supplements  DVT prophylaxis: Warfarin Code Status:  DNR Family Communication: I have updated daughter patient's request Disposition Plan: PT eval >> HHPT and OT with intermittent supervision when stable. Added H/H: RN  Consultants:   None   Procedures:  none  Antimicrobials:  Anti-infectives    Start     Dose/Rate Route Frequency Ordered Stop   09/22/16 0000  ertapenem [facility]) IVPB     1 g Intravenous Every 24 hours 09/22/16 1425 10/01/16 2359   09/18/16 2000  cefTRIAXone (ROCEPHIN) 1 g in dextrose 5 % 50 mL IVPB  Status:  Discontinued     1 g 100 mL/hr over 30 Minutes Intravenous Every 24 hours 09/17/16 2116 09/18/16 1042   09/18/16 1445  meropenem (MERREM) 1 g in sodium chloride 0.9 % 100 mL IVPB     1 g 200 mL/hr over 30 Minutes Intravenous Every 8 hours 09/18/16 1432     09/17/16 2045  cefTRIAXone (ROCEPHIN) 1 g in dextrose 5 % 50 mL IVPB     1 g 100 mL/hr over 30 Minutes Intravenous  Once 09/17/16 2044 09/17/16 2121       Objective: Vitals:   09/21/16 0547 09/21/16 1445 09/21/16 2052 09/22/16 0630  BP: (!) 144/76 (!) 139/57 137/74 (!) 146/71  Pulse: 66  83 71  Resp: 18 18 18 20   Temp: 98.3 F (36.8 C) 98.1 F (36.7 C) 98.2 F (36.8 C) 97.7 F (36.5 C)  TempSrc:    Oral  SpO2:  100%  98% 100%  Weight:      Height:        Intake/Output Summary (Last 24 hours) at 09/22/16 1427 Last data filed at 09/22/16 0917  Gross per 24 hour  Intake          2128.75 ml  Output             1700 ml  Net           428.75 ml   Filed Weights   09/17/16 1925  Weight: 86.2 kg (190 lb)    Examination: Exam unchanged when compared to 09/21/2016 General exam: Awake and alert, in nad. HEENT: PERRLA, oral mucosa moist, no sclera icterus or thrush, supple Respiratory system: equal chest rise,  No wheezes, no rhales Cardiovascular system: S1 & S2 heard, RRR.  No murmurs  Gastrointestinal system: Abdomen soft, non-tender, nondistended. Normal bowel sound. No organomegaly Central nervous system: Alert and oriented. No focal  neurological deficits. Extremities: No cyanosis, clubbing - mild edema of right foot Skin: No rashes or ulcers- bruising on right toes with erythematosus tender area on dorsum about 2'' in diameter. Psychiatry:  Mood & affect appropriate.   Data Reviewed: I have personally reviewed following labs and imaging studies  CBC:  Recent Labs Lab 09/17/16 1954 09/18/16 0607 09/19/16 0825 09/20/16 0405 09/22/16 0446  WBC 17.5* 18.6* 10.6* 5.3 7.1  NEUTROABS 14.4*  --   --   --   --   HGB 14.1 13.1 12.4 11.8* 11.8*  HCT 42.1 40.2 38.3 37.1 36.9  MCV 85.6 86.3 85.9 86.3 86.0  PLT 273 271 211 215 932   Basic Metabolic Panel:  Recent Labs Lab 09/17/16 1954 09/18/16 0607 09/18/16 0900 09/19/16 0825 09/20/16 0405 09/22/16 0446  NA 133* 139  --  138 140 141  K 3.8 3.4*  --  4.1 4.0 4.3  CL 99* 106  --  109 110 109  CO2 23 26  --  20* 24 28  GLUCOSE 181* 144*  --  123* 122* 109*  BUN 11 11  --  9 8 8   CREATININE 0.83 0.72  --  0.58 0.58 0.64  CALCIUM 9.2 8.7*  --  8.7* 8.7* 8.7*  MG  --   --  1.9  --   --   --    GFR: Estimated Creatinine Clearance: 69.3 mL/min (by C-G formula based on SCr of 0.64 mg/dL). Liver Function Tests:  Recent Labs Lab 09/17/16 1954  AST 32  ALT 26  ALKPHOS 120  BILITOT 1.2  PROT 6.9  ALBUMIN 3.5   No results for input(s): LIPASE, AMYLASE in the last 168 hours. No results for input(s): AMMONIA in the last 168 hours. Coagulation Profile:  Recent Labs Lab 09/18/16 0607 09/19/16 0553 09/20/16 0405 09/21/16 0251 09/22/16 0446  INR 2.59 2.46 2.14 2.54 2.57   Cardiac Enzymes: No results for input(s): CKTOTAL, CKMB, CKMBINDEX, TROPONINI in the last 168 hours. BNP (last 3 results) No results for input(s): PROBNP in the last 8760 hours. HbA1C: No results for input(s): HGBA1C in the last 72 hours. CBG:  Recent Labs Lab 09/18/16 0825 09/19/16 0801 09/20/16 0807 09/21/16 0800  GLUCAP 130* 113* 117* 119*   Lipid Profile: No results for  input(s): CHOL, HDL, LDLCALC, TRIG, CHOLHDL, LDLDIRECT in the last 72 hours. Thyroid Function Tests: No results for input(s): TSH, T4TOTAL, FREET4, T3FREE, THYROIDAB in the last 72 hours. Anemia Panel: No results for input(s): VITAMINB12, FOLATE, FERRITIN, TIBC, IRON, RETICCTPCT in  the last 72 hours. Urine analysis:    Component Value Date/Time   COLORURINE YELLOW 09/17/2016 1942   APPEARANCEUR CLOUDY (A) 09/17/2016 1942   LABSPEC 1.013 09/17/2016 1942   PHURINE 6.0 09/17/2016 1942   GLUCOSEU NEGATIVE 09/17/2016 1942   HGBUR MODERATE (A) 09/17/2016 1942   BILIRUBINUR NEGATIVE 09/17/2016 1942   BILIRUBINUR negative 06/27/2016 1222   KETONESUR NEGATIVE 09/17/2016 1942   PROTEINUR 100 (A) 09/17/2016 1942   UROBILINOGEN negative (A) 06/27/2016 1222   UROBILINOGEN 0.2 09/07/2012 1049   NITRITE POSITIVE (A) 09/17/2016 1942   LEUKOCYTESUR LARGE (A) 09/17/2016 1942   Sepsis Labs: @LABRCNTIP (procalcitonin:4,lacticidven:4) ) Recent Results (from the past 240 hour(s))  Urine Culture     Status: Abnormal   Collection Time: 09/17/16  7:40 PM  Result Value Ref Range Status   Specimen Description URINE, CATHETERIZED  Final   Special Requests NONE  Final   Culture (A)  Final    30,000 COLONIES/mL ESCHERICHIA COLI Confirmed Extended Spectrum Beta-Lactamase Producer (ESBL)    Report Status 09/20/2016 FINAL  Final   Organism ID, Bacteria ESCHERICHIA COLI (A)  Final      Susceptibility   Escherichia coli - MIC*    AMPICILLIN >=32 RESISTANT Resistant     CEFAZOLIN >=64 RESISTANT Resistant     CEFTRIAXONE >=64 RESISTANT Resistant     CIPROFLOXACIN >=4 RESISTANT Resistant     GENTAMICIN <=1 SENSITIVE Sensitive     IMIPENEM <=0.25 SENSITIVE Sensitive     NITROFURANTOIN <=16 SENSITIVE Sensitive     TRIMETH/SULFA >=320 RESISTANT Resistant     AMPICILLIN/SULBACTAM >=32 RESISTANT Resistant     PIP/TAZO <=4 SENSITIVE Sensitive     Extended ESBL POSITIVE Resistant     * 30,000 COLONIES/mL  ESCHERICHIA COLI  Blood Culture (routine x 2)     Status: Abnormal   Collection Time: 09/17/16  7:48 PM  Result Value Ref Range Status   Specimen Description BLOOD RIGHT ANTECUBITAL  Final   Special Requests   Final    BOTTLES DRAWN AEROBIC AND ANAEROBIC Blood Culture adequate volume   Culture  Setup Time   Final    GRAM NEGATIVE RODS IN BOTH AEROBIC AND ANAEROBIC BOTTLES CRITICAL RESULT CALLED TO, READ BACK BY AND VERIFIED WITH: E MARTIN,PHARMD AT 1035 09/18/16 BY L BENFIELD    Culture (A)  Final    ESCHERICHIA COLI Confirmed Extended Spectrum Beta-Lactamase Producer (ESBL)    Report Status 09/20/2016 FINAL  Final   Organism ID, Bacteria ESCHERICHIA COLI  Final      Susceptibility   Escherichia coli - MIC*    AMPICILLIN >=32 RESISTANT Resistant     CEFAZOLIN >=64 RESISTANT Resistant     CEFEPIME >=64 RESISTANT Resistant     CEFTAZIDIME RESISTANT Resistant     CEFTRIAXONE >=64 RESISTANT Resistant     CIPROFLOXACIN >=4 RESISTANT Resistant     GENTAMICIN <=1 SENSITIVE Sensitive     IMIPENEM <=0.25 SENSITIVE Sensitive     TRIMETH/SULFA >=320 RESISTANT Resistant     AMPICILLIN/SULBACTAM >=32 RESISTANT Resistant     PIP/TAZO <=4 SENSITIVE Sensitive     Extended ESBL POSITIVE Resistant     * ESCHERICHIA COLI  Blood Culture ID Panel (Reflexed)     Status: Abnormal   Collection Time: 09/17/16  7:48 PM  Result Value Ref Range Status   Enterococcus species NOT DETECTED NOT DETECTED Final   Listeria monocytogenes NOT DETECTED NOT DETECTED Final   Staphylococcus species NOT DETECTED NOT DETECTED Final   Staphylococcus aureus NOT  DETECTED NOT DETECTED Final   Streptococcus species NOT DETECTED NOT DETECTED Final   Streptococcus agalactiae NOT DETECTED NOT DETECTED Final   Streptococcus pneumoniae NOT DETECTED NOT DETECTED Final   Streptococcus pyogenes NOT DETECTED NOT DETECTED Final   Acinetobacter baumannii NOT DETECTED NOT DETECTED Final   Enterobacteriaceae species DETECTED (A) NOT  DETECTED Final    Comment: Enterobacteriaceae represent a large family of gram-negative bacteria, not a single organism. CRITICAL RESULT CALLED TO, READ BACK BY AND VERIFIED WITH: E MARTIN,PHARMD AT 1035 09/18/16 BY L BENFIELD    Enterobacter cloacae complex NOT DETECTED NOT DETECTED Final   Escherichia coli DETECTED (A) NOT DETECTED Final    Comment: CRITICAL RESULT CALLED TO, READ BACK BY AND VERIFIED WITH: E MARTIN,PHARMD AT 1035 09/18/16 BY L BENFIELD    Klebsiella oxytoca NOT DETECTED NOT DETECTED Final   Klebsiella pneumoniae NOT DETECTED NOT DETECTED Final   Proteus species NOT DETECTED NOT DETECTED Final   Serratia marcescens NOT DETECTED NOT DETECTED Final   Carbapenem resistance NOT DETECTED NOT DETECTED Final   Haemophilus influenzae NOT DETECTED NOT DETECTED Final   Neisseria meningitidis NOT DETECTED NOT DETECTED Final   Pseudomonas aeruginosa NOT DETECTED NOT DETECTED Final   Candida albicans NOT DETECTED NOT DETECTED Final   Candida glabrata NOT DETECTED NOT DETECTED Final   Candida krusei NOT DETECTED NOT DETECTED Final   Candida parapsilosis NOT DETECTED NOT DETECTED Final   Candida tropicalis NOT DETECTED NOT DETECTED Final  Blood Culture (routine x 2)     Status: None   Collection Time: 09/17/16  7:54 PM  Result Value Ref Range Status   Specimen Description BLOOD RIGHT ARM  Final   Special Requests   Final    BOTTLES DRAWN AEROBIC AND ANAEROBIC Blood Culture adequate volume   Culture NO GROWTH 5 DAYS  Final   Report Status 09/22/2016 FINAL  Final  MRSA PCR Screening     Status: None   Collection Time: 09/18/16  7:59 AM  Result Value Ref Range Status   MRSA by PCR NEGATIVE NEGATIVE Final    Comment:        The GeneXpert MRSA Assay (FDA approved for NASAL specimens only), is one component of a comprehensive MRSA colonization surveillance program. It is not intended to diagnose MRSA infection nor to guide or monitor treatment for MRSA infections.           Radiology Studies: No results found.    Scheduled Meds: . atorvastatin  10 mg Oral Daily  . Carbidopa-Levodopa ER  1 tablet Oral TID  . celecoxib  100 mg Oral BID  . cholecalciferol  5,000 Units Oral Daily  . cyanocobalamin  500 mcg Oral Daily  . DULoxetine  60 mg Oral Daily  . fluticasone  1 spray Each Nare Daily  . irbesartan  150 mg Oral Daily  . levothyroxine  150 mcg Oral QAC breakfast  . loratadine  10 mg Oral Daily  . magnesium oxide  200 mg Oral Daily  . [name]-3 acid ethyl esters  1 g Oral Daily  . sodium chloride flush  3 mL Intravenous Q12H  . warfarin  4 mg Oral ONCE-1800  . Warfarin - Pharmacist Dosing Inpatient   Does not apply q1800   Continuous Infusions: . sodium chloride 75 mL/hr at 09/21/16 1950  . meropenem (MERREM) IV Stopped (09/22/16 0538)  . sodium chloride       LOS: 5 days    Time spent in minutes: [place],  Linward Foster, MD [facility] Pager: [email or web address] Password [email or web address] 09/22/2016, 2:27 PM

## 2016-09-22 NOTE — Care Management Note (Addendum)
Case Management Note  Patient Details  Name: [name] [name] MRN: [id number] Date of Birth: 1945-10-03  Subjective/Objective:     Severe sepsis due to E coli UTI and bacteremia, Acute Metabolic encephalopathy               Action/Plan: Discharge Planning: NCM spoke to [facility] rep and they use [facility]. Faxed Iv abx Rx to [facility] and they do not have generic Invanz in stock. Insurance will only cover generic. Contacted [facility] rep and they do have generic. Faxed IV abx Rx to [facility] pharmacy. Pt states she lives at [address]. Will update [facility] on correct address. Pt scheduled to receive IV abx Invanz prior to [employer] and [place] agency will do a start of care on Sunday.    Monica Mcguire-Daughter [phone]  PCP Darreld Mclean MD  Expected Discharge Date:                 Expected Discharge Plan:  [name]  In-House Referral:  Clinical Social Work  Discharge planning Services  CM Consult  Post Acute Care Choice:  Home Health Choice offered to:  Patient  DME Arranged:    DME Agency:  NA  HH Arranged:  PT, OT, Nurse's Aide, RN, IV Antibiotics HH Agency:  [place]  Status of Service:  In process, will continue to follow  If discussed at Long Length of Stay Meetings, dates discussed:    Additional Comments:  Erenest Rasher, RN 09/22/2016, 6:05 PM

## 2016-09-22 NOTE — Progress Notes (Signed)
NEUROPSYCHOLOGICAL EVALUATION   Name:    [name] [name]  Date of Birth:   06-30-1945 Date of Interview:  09/07/2016 Date of Testing:  09/07/2016   Date of Feedback:  09/26/2016       Background Information:  Reason for Referral:  [name] [name] is a 71 y.o. female with a history of parkinsonism who is referred by Dr. Wells Guiles Tat to assess her current level of cognitive functioning and assist in differential diagnosis. The current evaluation consisted of a review of available medical records, an interview with the patient, and the completion of a neuropsychological testing battery. Informed consent was obtained.  History of Presenting Problem [per my 03/28/2016 visit with the patient and her daughter]:  [name] [name] is followed by Dr. [name] for parkinsonism (possible MSA). She saw Dr.Tat on11/21/2017. MOCA at that time was 23/30. The patient had fallen on 12/17/2015 when shegot up in the middle of the night to use the bathroom and lost her balance and fell through the sheet rock head-first. She denied LOC.She sustained a moderately displaced fracture involving the posterior spinous process of C5. She was hospitalized from10/13-10/16 and in inpatient rehabilitation from10/16-10/27. She is currently residing at [facility].   Prior to the fall, the patient was managing all instrumental ADLs and was driving even though she was told not to due to vision impairment. The patient also was working from home full-time. 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   The patient reported cognitive symptoms immediately following the probable concussion in October, but she feels these symptoms have resolved. She thinks she is back to her baseline cognitively. Her daughter, [name] [name], agrees that there has been significant improvement but does not think the patient is quite back to her baseline.   Immediately after the fall, the patient  experienced confusion, disorientation, hallucinations and delusions, per her daughter. Even once she was out of the hospital, "her behavior was off". She had trouble using her cell phone and paying her bills. [name] [name] feels the patient's processing speed is slowed.  [name] [name] does note that there were some mild cognitive issues prior to the fall. Old medication bottles with lots of medications in them were found when the family went in her home while she was in the hospital. It appears that she was not taking her medications as prescribed, and did not have a good system in place.   Since the fall, [name] [name] has not driven. At [facility], the patient's medications are managed for her and administered directly to her. She does not have access to cooking/kitchen in her apartment and has to go to the dining room for all meals. [name] [name] was helping her with her finances, but [name] [name] recently started managing these on her own, and there have not been any problems that her daughter knows of.   [name] [name] and [name] [name] denied the following symptoms at the present time: Forgetting recent conversations/events, misplacing/losing items, forgetting appointments/obligations, difficulty concentrating, starting but not finishing tasks, distractibility, communication difficulties, or comprehension difficulty.   The patient is unhappy with having to move to assisted living, but she understands this was the recommendation of her physicians. Her son and his girlfriend and children are currently living in her home. There is a lot of family stress surrounding her son, and her son and daughter are no longer speaking to one another. [name] [name] has tried to help the patient as much as she can, but she  has three children (including 27yo with autism and an 90moold baby) and works as well.   Ms. GDunivanreported increased depression secondary to her condition and reduced independence. She would like to return to work. She wishes she  could return to living in her home but she feels she does not have a choice in the matter. Her daughter has informed her that the other option is for her to move into an apartment and have in-home care.   The patient also reported a new fear of death since her fall. She is also worried she will have another fall and be more badly injured.  Her appetite is reduced, but [place] She is having sleep difficulty. She was previously taking 10 mg of Ambien nightly for 15 years. Her daughter was very concerned about balance issues and confusion secondary to Ambien. She observed her mother's mobility/balance being very impaired after the patient took Ambien. Also, the patient's falls tended to occur at nighttime. As such, Ambien was cut to 5 mg nightly. Now the patient feels her sleep is very impaired. She has difficulty falling asleep and staying asleep. She has started taking melatonin. I provided her with information regarding sleep hygiene as well.   Physically, the patient complains of chronic pain (she has fibromyalgia). She reported a "splitting headache" the day of her interview with me. She has not had any falls since the one in October. She is using a walker. She was recently discharged from physical therapy.   She has not had any hallucinations since being discharged from the hospital. She denied suicidal ideation or intention.   Psychiatric history was denied. She did have normal grief after the unexpected death of her husband and she had grief counseling for that. She noted that since then she has been more prone to get tearful. Recently, she is much more tearful.  Family history is reportedly significant for hand tremors in her late sister and late father, and cerebral palsy in her other sister (still living, age 73. There is no known family history of dementia.   Update 09/07/2016:  The patient did not complete her neuropsychological testing as scheduled  earlier this year and was encouraged to reschedule by Dr. TCarles Collet The patient follows up today to complete her evaluation.   She has had pneumonia twice this year and also was hospitalized from 04/29/2016-05/03/2016 for sepsis due to E Coli UTI. [facility]. She no longer misses living at her own home. She denies any cognitive concerns at the present time. She states [facility] and was told she is "as sharp as a tack".   She saw Dr. TCarles Colleton 07/12/2016. Since seeing Dr. TCarles Collet she says she has only had one fall, when she was trying to roll out of bed and caught the night stand. She did not hit the floor. She did not hit her head. She bruised her arm but no other injuries. She is faithfully using her walker. She notes that a merry walker was recommended by Dr. TCarles Colletbut the patient feels it is like a "cage".   She is still not driving but she is managing her medications, finances and appointments independently with no reported problems. Meals are provided by the facility.  She continues to have insomnia which she blames on cutting back Ambien to 5 mg.  She reports she sometimes does not fall asleep until 4:30 am.   She denies visual illusions or hallucinations. She continues to have occasional blurred/double vision and is seeing her eye doctor next week.  She admits she has been "a little depressed", more tearful. She is worried about her grandchildren. She notes that her pain management doctor changed her Effexor to Cymbalta and she does not think this medication is working as well. She denied social isolation or vegetative symptoms of depression. She denied suicidal ideation or intention. She does not feel she needs counseling.   Social History: Born/Raised: [place] Education: High school graduate. Took some college courses. Occupational history:  Currently on disability. She has worked in the field of mortgage/insurance since 1991. Shetook early retirement from the company she was working forin [place]. A company who used to use their services asked her to help them with the same kind of work. Shedid this for 15 years, working from home. Marital history: Widowed with two children and five grandchildren. Alcohol/Tobacco/Substances: Essentially no alcohol (very rare/minimal). Former smoker (quit 1991). No history of recreational drug use.   Medical History:  Past Medical History:  Diagnosis Date  . Arthritis   . Chronic insomnia   . Complication of anesthesia    severe itching night of surgery requiring meds- also couldnt swallow- throat "paralyzed""  . Fibromyalgia   . Glaucoma   . Hypercholesterolemia   . Hyperlipidemia   . Hypertension    PCP Dr Wendy McNeill- LOV  05/15/11 with clearance and note on chart,    chest x ray, EKG 12/12 EPIC, eccho 7/11 EPIC  . Hypothyroidism    s/p graves disease  . Kidney stone   . PD (Parkinson's disease) (HCC)   . Peripheral vascular disease (HCC)    PULMONARY EMBOLUS x 2- 2011, 2012/ FOLLOWED BY DR ODOGWU-LOV 12/12 EPIC   PT STAES WILL STOP COUMADIN 3/12 and begin LOVONOX 05/17/11 as previously instructed  . Sinus infection    at present- dr Olins PA aware- was seen there and at PCP 05/10/11  . Thyroid disease    Hypothyroidism    Current medications:  Facility-Administered Encounter Medications as of 09/26/2016  Medication  . 0.9 %  sodium chloride infusion  . acetaminophen (TYLENOL) tablet 650 mg  . albuterol (PROVENTIL) (2.5 MG/3ML) 0.083% nebulizer solution 2.5 mg  . atorvastatin (LIPITOR) tablet 10 mg  . Carbidopa-Levodopa ER (SINEMET CR) 25-100 MG tablet controlled release 1 tablet  . celecoxib (CELEBREX) capsule 100 mg  . cholecalciferol (VITAMIN D) tablet 5,000 Units  . cyanocobalamin tablet 500 mcg  . DULoxetine (CYMBALTA) DR capsule 60 mg  . fluticasone (FLONASE) 50 MCG/ACT  nasal spray 1 spray  . hydrALAZINE (APRESOLINE) injection 5 mg  . irbesartan (AVAPRO) tablet 150 mg  . levothyroxine (SYNTHROID, LEVOTHROID) tablet 150 mcg  . loratadine (CLARITIN) tablet 10 mg  . magnesium oxide (MAG-OX) tablet 200 mg  . meropenem (MERREM) 1 g in sodium chloride 0.9 % 100 mL IVPB  . methocarbamol (ROBAXIN) tablet 500 mg  . omega-3 acid ethyl esters (LOVAZA) capsule 1 g  . ondansetron (ZOFRAN) injection 4 mg  . senna (SENOKOT) tablet 8.6 mg  . sodium chloride 0.9 % bolus 2,000 mL  . sodium chloride flush (NS) 0.9 % injection 3 mL  . warfarin (COUMADIN) tablet 4 mg  . Warfarin - Pharmacist Dosing Inpatient  . zolpidem (AMBIEN) tablet 5 mg   Outpatient Encounter Prescriptions as of 09/26/2016  Medication Sig  . acetaminophen (TYLENOL) 500   MG tablet Take 500 mg by mouth every 6 (six) hours as needed for mild pain.  . albuterol (PROVENTIL HFA;VENTOLIN HFA) 108 (90 Base) MCG/ACT inhaler Inhale 2 puffs into the lungs every 6 (six) hours as needed for wheezing or shortness of breath.  . atorvastatin (LIPITOR) 10 MG tablet Take 1 tablet (10 mg total) by mouth daily.  . Carbidopa-Levodopa ER (RYTARY) 23.75-95 MG CPCR Take 1 tablet by mouth 3 (three) times daily.  . celecoxib (CELEBREX) 100 MG capsule Take 1 capsule (100 mg total) by mouth 2 (two) times daily.  . cephALEXin (KEFLEX) 500 MG capsule Take 1 capsule (500 mg total) by mouth 2 (two) times daily.  . cetirizine (ZYRTEC) 10 MG tablet Take 1 tablet (10 mg total) by mouth daily.  . Cholecalciferol (VITAMIN D3) 5000 UNITS CAPS Take 5,000 Units by mouth daily.   . cyanocobalamin 500 MCG tablet Take 500 mcg by mouth daily.  . DULoxetine (CYMBALTA) 60 MG capsule Take 1 capsule (60 mg total) by mouth daily.  . ertapenem (INVANZ) IVPB Inject 1 g into the vein daily. Indication:  ESBL Ecoli bacteremia Last Day of Therapy:  10/01/16 Labs - Once weekly:  CBC/D and BMP, Labs - Every other week:  ESR and CRP  . fluticasone (FLONASE)  50 MCG/ACT nasal spray Place 1 spray into both nostrils daily.  . HYDROcodone-acetaminophen (NORCO/VICODIN) 5-325 MG tablet Take 1 tablet by mouth every 6 (six) hours as needed for moderate pain or severe pain.  . magnesium oxide (MAG-OX) 400 (241.3 Mg) MG tablet Take 0.5 tablets (200 mg total) by mouth daily.  . Menthol, Topical Analgesic, (BIOFREEZE EX) Apply 1 application topically 4 (four) times daily. Apply to affected area.  . methocarbamol (ROBAXIN) 500 MG tablet Take 500 mg by mouth every 6 (six) hours as needed for muscle spasms.  . omega-3 acid ethyl esters (LOVAZA) 1 g capsule Take 1 g by mouth daily.   . ondansetron (ZOFRAN ODT) 4 MG disintegrating tablet Take 1 tablet (4 mg total) by mouth every 8 (eight) hours as needed for nausea or vomiting.  . Oxycodone HCl 10 MG TABS Take 1/2 tablet daily as needed for neck pain. If pain persists may take another 1/2 tablet per day (Patient taking differently: Take 5 mg by mouth See admin instructions. Take 5 mg daily as needed for neck pain. If pain persists may take another 5 mg per day)  . potassium chloride SA (K-DUR,KLOR-CON) 20 MEQ tablet Take 1 tablet (20 mEq total) by mouth daily.  . SYNTHROID 150 MCG tablet Take 1 tablet (150 mcg total) by mouth daily before breakfast.  . telmisartan-hydrochlorothiazide (MICARDIS HCT) 40-12.5 MG tablet Take 1 tablet by mouth daily.  . warfarin (COUMADIN) 4 MG tablet Take 2-4 mg by mouth See admin instructions. Take 2 mg by mouth daily on Saturday. Take 4 mg by mouth daily on all other days.  . zolpidem (AMBIEN) 5 MG tablet Take 1 tablet (5 mg total) by mouth at bedtime. Use ONLY WHEN NEEDED for sleep. Does not have to be used daily     Current Examination:  Behavioral Observations:   Appearance: Neatly, casually and appropriately dressed and groomed Gait: Ambulated with walker; cautious gait Speech: Fluent; normal rate, rhythm and volume. No word finding difficulty during conversational  speech. Thought process: Linear, goal directed Affect: Somewhat flat/masked. Patient appeared somewhat hostile about having to do the testing during her testing session. Interpersonal: Pleasant, appropriate Orientation: Oriented to all spheres. Accurately named the current   President and his predecessor.   Tests Administered: . Test of Premorbid Functioning (TOPF) . Wechsler Adult Intelligence Scale-Fourth Edition (WAIS-IV): Similarities, Block Design, Matrix Reasoning, and Digit Span subtests . Wechsler Memory Scale-Fourth Edition (WMS-IV) Older Adult Version (ages 65-90): Logical Memory I, II and Recognition subtests  . [place] Verbal Learning Test - 2nd Edition (CVLT-2) Short Form . Repeatable Battery for the Assessment of Neuropsychological Status (RBANS) Form A:  Figure Copy and Recall subtests and Semantic Fluency subtest . Neuropsychological Assessment Battery (NAB) Language Module, Form 1: Naming and Bill Payment subtests . Boston Diagnostic Aphasia Examination: Complex Ideational Material subtest . Controlled Oral Word Association Test (COWAT) . Trail Making Test A and B . Clock drawing test . Symbol Digit Modalities Test (SDMT) . Geriatric Depression Scale (GDS) 15 Item . Generalized Anxiety Disorder - 7 item screener (GAD-7) . Parkinson's Disease Questionnaire (PDQ-39)  Test Results: Note: Standardized scores are presented only for use by appropriately trained professionals and to allow for any future test-retest comparison. These scores should not be interpreted without consideration of all the information that is contained in the rest of the report. The most recent standardization samples from the test publisher or other sources were used whenever possible to derive standard scores; scores were corrected for age, gender, ethnicity and education when available.   Test Scores:  Test Name Raw Score Standardized Score Descriptor  TOPF 45/70 SS= 102 Average  WAIS-IV Subtests      Similarities 17/36 ss= 7 Low average  Block Design 28/66 ss= 9 Average  Matrix Reasoning 8/26 ss= 8 Average  Digit Span Forward 12/16 ss= 13 High average  Digit Span Backward 5/16 ss= 6 Low average  WMS-IV Subtests     LM I 35/53 ss= 11 Average  LM II 21/39 ss= 11 Average  LM II Recognition 19/23 Cum %: 51-75 WNL  RBANS Subtests     Figure Copy 17/20 Z= -0.4 Average   Figure Recall 15/20 Z= 0.6 Average  Semantic Fluency 15 Z= -0.9 Low average  CVLT-II Scores     Trial 1 6/9 Z= 0 Average  Trial 4 8/9 Z= 0 Average  Trials 1-4 total 25/36 T= 47 Average  SD Free Recall 7/9 Z= 0 Average  LD Free Recall 7/9 Z= 0.5 Average  LD Cued Recall 7/9 Z= 0 Average  Recognition Discriminability 9/9 hits, 0 false positives Z= 0.5 Average   Forced Choice Recognition 9/9  WNL  NAB Language subtests     Naming 31/31 T= 59 High average  Bill Payment 17/19 T= 44 Average   BDAE Complex Ideational Material 11/12  WNL  COWAT-FAS 15 T= 34 Borderline  COWAT-Animals 12 T= 40 Low average  Trail Making Test A  46" 0 errors T= 47 Average  Trail Making Test B  Pt unable   Impaired  Clock Drawing   WNL  SDMT     Written 40/110 Z= 0.7 High average  Oral 28/110 Z= -1.2 Low average  GDS-15 1/15  WNL  GAD-7 6/21  Mild  PDQ-39     Mobility 52.5%    Activities of Daily Living 8.33%    Emotional Well Being 25%    Stigma 6.25%    Social Support 0    Cognitive Impairment 25%    Communication 0    Bodily Discomfort 25%        Description of Test Results:  Premorbid verbal intellectual abilities were estimated to have been within the average range based on a test of   word reading. Psychomotor processing speed was high average. She did not demonstrate any improvement when the motor component was removed. Basic auditory attention was high average, and more complex auditory attention or working memory was low average. Visual-spatial construction was average. Language abilities were within normal limits.  Specifically, confrontation naming was high avrage, and semantic verbal fluency was low average. Auditory comprehension of complex ideational material was within normal limits. On a simulated bill payment task which required multiple aspects of both expressive and receptive language, she performed within the average range. With regard to verbal memory, encoding and acquisition of non-contextual information (i.e., word list) was average. After a brief distracter task, free recall was average (7/9 items recalled). After a delay, free recall was average (7/9 items recalled). Cued recall was average (7/9 items recalled). Performance on a yes/no recognition task was average. On another verbal memory test, encoding and acquisition of contextual auditory information (i.e., short stories) was average. After a delay, free recall was average. Performance on a yes/no recognition task was within normal limits. With regard to non-verbal memory, delayed free recall of visual information was average. Performance across tasks measuring multiple aspects of executive functioning was somewhat variable. Mental flexibility and set-shifting were impaired; she was unable to complete Trails [place]. Verbal fluency with phonemic search restrictions was borderline impaired. Verbal abstract reasoning was low average. Non-verbal abstract reasoning was average. Performance on a clock drawing task was within normal limits. On a self-report measure of mood, the patient's responses were not indicative of clinically significant depression at the present time. On a self-report measure of anxiety, the patient endorsed mild generalized anxiety at the present time, characterized by excessive worrying, difficulty controlling worrying, and difficulty relaxing. On a self-report measure assessing the impact of parkinsonism on daily functioning and quality of life, the patient reported the most difficulty with mobility. She denied any significant difficulties  with social support or stigma. She endorsed minimal difficulty with ADLs. She endorsed mild difficulty with cognitive functioning, physical discomfort, and emotional wellbeing.   Clinical Impressions: Mild cognitive impairment (executive functioning), mild anxiety. Results of cognitive testing were largely within normal limits. She demonstrated intact attention, processing speed, language, visual spatial construction and both verbal and nonverbal memory. She did demonstrate a relative weakness in executive functioning. Mental flexibility was impaired and phonemic verbal fluency was below expectation. Based on these test results she does not meet criteria for a dementia syndrome. However, a diagnosis of mild cognitive impairment (single domain) is warranted. Her head injury in 12/2015 clearly caused cognitive symptoms that appear to have largely resolved. It is possible that some executive dysfunction is related to her history of head injury, but it should be noted that her cognitive profile (i.e. mild executive dysfunction with intact memory, no dementia) is very common in multiple system atrophy. There is no evidence of underlying Alzheimer's disease. She is also experiencing at least mild anxiety/depression, likely related to situational factors / psychosocial stressors. Of note, in the interim between her cognitive evaluation with me and our follow-up appointment today, the patient was hospitalized 7/15-7/21/2018 for severe sepsis due to E coli UTI with acute metabolic encephalopathy. Encephalopathy appears to have resolved per observations of the patient today and informant report by her daughter, as well as hospital discharge summary.   Recommendations/Plan: Based on the findings of the present evaluation, the following recommendations are offered:  1. The patient and her daughter were educated regarding MCI, and the vulnerability she has to acute confusion/delirium when she  experiences a systemic  infection. They were encouraged by the lack of evidence of dementia. 2. I think it is best for her to stay in an assisted living community due to her physical limitations and fall risk. I explained to her that I feel she will function best if she stays in a supportive structured environment where she does not have to do things like driving and cooking. I explained that despite the fact she does not have dementia I would not want her driving given her executive dysfunction. She reported understanding my recommendations and plans to continue living at [facility].  3. Depression/anxiety should continue to be monitored. She reports her Effexor was changed to Cymbalta and this is not working as well; she was encouraged to follow up with her prescribing physician about this. She could also benefit from individual counseling for depression/anxiety in addition to medication, if she is interested. 4. Neuropsychological re-evaluation can be considered in the future if a change in cognitive functioning is reported or observed.    Feedback to Patient: Dyllan Kats and her daughter returned for a feedback appointment on 09/26/2016 to review the results of her neuropsychological evaluation with this provider. 20 minutes face-to-face time was spent reviewing her test results, my impressions and my recommendations as detailed above.    Total time spent on this patient's case: [id number] unit for interview with psychologist; [phone] units of testing by psychometrician under psychologist's supervision; [phone] units for medical record review, scoring of neuropsychological tests, interpretation of test results, preparation of this report, and review of results to the patient by psychologist.      Thank you for your referral of Stacy Sailer. Please feel free to contact me if you have any questions or concerns regarding this report.

## 2016-09-22 NOTE — Progress Notes (Signed)
PHARMACY CONSULT NOTE FOR: Ertapenem  OUTPATIENT  PARENTERAL ANTIBIOTIC THERAPY (OPAT)  Indication: ESBL Ecoli bacteremia Regimen: Ertapenem 1gm IV q24h End date: 10/01/16  IV antibiotic discharge orders are pended. To discharging provider:  please sign these orders via discharge navigator,  Select New Orders & click on the button choice - Manage This Unsigned Work.     Thank you for allowing pharmacy to be a part of this patient's care.  Vernell Townley, Rocky Crafts 09/22/2016, 11:40 AM

## 2016-09-23 DIAGNOSIS — L03115 Cellulitis of right lower limb: Secondary | ICD-10-CM

## 2016-09-23 LAB — PROTIME-INR
INR: 2.37
Prothrombin Time: 26.4 seconds — ABNORMAL HIGH (ref 11.4–15.2)

## 2016-09-23 LAB — GLUCOSE, CAPILLARY: GLUCOSE-CAPILLARY: 109 mg/dL — AB (ref 65–99)

## 2016-09-23 MED ORDER — SODIUM CHLORIDE 0.9% FLUSH
10.0000 mL | INTRAVENOUS | Status: DC | PRN
  Administered 2016-09-23: 10 mL
  Filled 2016-09-23: qty 40

## 2016-09-23 MED ORDER — MEROPENEM IV (FOR PTA / DISCHARGE USE ONLY)
1.0000 g | Freq: Three times a day (TID) | INTRAVENOUS | 0 refills | Status: AC
Start: 2016-09-23 — End: 2016-10-01

## 2016-09-23 MED ORDER — HEPARIN SOD (PORK) LOCK FLUSH 100 UNIT/ML IV SOLN
250.0000 [IU] | INTRAVENOUS | Status: AC | PRN
  Administered 2016-09-23: 250 [IU]

## 2016-09-23 MED ORDER — WARFARIN SODIUM 4 MG PO TABS
4.0000 mg | ORAL_TABLET | Freq: Once | ORAL | Status: DC
  Filled 2016-09-23: qty 1

## 2016-09-23 NOTE — Discharge Summary (Signed)
Physician Discharge Summary  [name] [name] VZD:[id number] DOB: 11-Oct-1945 DOA: 09/17/2016  PCP: Darreld Mclean, MD  Admit date: 09/17/2016 Discharge date: 09/23/2016  Time spent: More than 35 minutes  Recommendations for Outpatient Follow-up:  1. Patient will need 9 more days of antibiotics to complete a 14 day treatment course. 2. Yesterday place order for secretary to set up appointment with urologist for further workup and recommendations given recurrent UTIs   Discharge Diagnoses:  Principal Problem:   Sepsis ([name]) Active Problems:   History of pulmonary embolism   Parkinson's disease ([name])   Benign essential HTN   UTI (urinary tract infection)   Hyperlipidemia   Hypothyroidism   Acute metabolic encephalopathy   Cellulitis of right foot   Discharge Condition: Stable  Diet recommendation: Regular diet  Filed Weights   09/17/16 1925  Weight: 86.2 kg (190 lb)    History of present illness:  71 y.o. female who lives in independent living and has a h/o HTN, HLD, Parkinson's disease, fibromyalgia, hypercoagulable state, PE on Coumadin, PVD, depression who is brought in her her daughter for right foot bruising, redness and swelling. She does not remember injuring it. The toes are bruised and there is a raised red area about 2 '' in diameter on the dorsum of the foot which is tender. She has been walking on it but it is painful.   She presented with confusion and fever and found to have UTI. Reportedly in history has had frequent UTIs.  Hospital Course:  Severe Sepsis due to E coli UTI and bactermia - E coli in the past and as well as ESBL in Feb of this year - Blood cultures 1/2 sets growing E coli- change Rocephin to Imipenem and patient improved on this regimen-  Day 6 of 14 - u culture - 30,000 colonies of E coli  Active Problems:   Acute metabolic encephalopathy - Suspect patient at baseline. Was secondary to infectious etiology  Injury of right foot - no  fracture noted on imaging- does not appear to be cellulitis  - apply ice and elevate - Recommend dermatology consult on discharge. If it were infection patient is going to be on 14 days of antibiotics  Hypokalemia - After replacement within normal limits    History of pulmonary embolism/ hypercoagulable state - Pharmacy dosing Coumadin    Parkinson's disease  - Stable on Sinemet    Benign essential HTN - Continue pt on Avapro    Hyperlipidemia -  Pt on lipitor - no chest pain    Hypothyroidism - Stable continue prior to admission home medication regimen on discharge  Depression - Stable continue current regimen  Procedures:  None  Consultations:  None  Discharge Exam: Vitals:   09/22/16 2252 09/23/16 0518  BP: (!) 159/69 (!) 159/62  Pulse: 77 69  Resp: 18 18  Temp: 97.9 F (36.6 C) 98 F (36.7 C)    General: Pt in nad, alert and awake Cardiovascular: rrr, no rubs Respiratory: no increased wob, no wheezes  Discharge Instructions   Discharge Instructions    Call MD for:  difficulty breathing, headache or visual disturbances    Complete by:  As directed    Call MD for:  temperature >100.4    Complete by:  As directed    Diet - low sodium heart healthy    Complete by:  As directed    Home infusion instructions [facility] May follow [place] Dosing Protocol; May administer Cathflo as needed to  maintain patency of vascular access device.; Flushing of vascular access device: per [employer] Protocol: 0.9% NaCl pre/post medica...    Complete by:  As directed    Instructions:  May follow [place] Dosing Protocol   Instructions:  May administer Cathflo as needed to maintain patency of vascular access device.   Instructions:  Flushing of vascular access device: per [facility] Protocol: 0.9% NaCl pre/post medication administration and prn patency; Heparin 100 u/ml, 82m for implanted ports and Heparin 10u/ml, 582mfor all other central venous catheters.    Instructions:  May follow [facility] Anaphylaxis Protocol for First Dose Administration in the home: 0.9% NaCl at 25-50 ml/hr to maintain IV access for protocol meds. Epinephrine 0.3 ml IV/IM PRN and Benadryl 25-50 IV/IM PRN s/s of anaphylaxis.   Instructions:  [facility] Coordinator (RN) to assist per patient IV care needs in the home PRN.   Home infusion instructions [facility] May follow [place] Protocol; May administer Cathflo as needed to maintain patency of vascular access device.; Flushing of vascular access device: [facility]: 0.9% NaCl pre/post medica...    Complete by:  As directed    Instructions:  May follow [place] Protocol   Instructions:  May administer Cathflo as needed to maintain patency of vascular access device.   Instructions:  Flushing of vascular access device: per [facility]: 0.9% NaCl pre/post medication administration and prn patency; Heparin 100 u/ml, 27m63mor implanted ports and Heparin 10u/ml, 27ml40mr all other central venous catheters.   Instructions:  May follow [facility] Anaphylaxis Protocol for First Dose Administration in the home: 0.9% NaCl at 25-50 ml/hr to maintain IV access for protocol meds. Epinephrine 0.3 ml IV/IM PRN and Benadryl 25-50 IV/IM PRN s/s of anaphylaxis.   Instructions:  [facility]usion Coordinator (RN) to assist per patient IV care needs in the home PRN.   Increase activity slowly    Complete by:  As directed      Current Discharge Medication List    START taking these medications   Details  meropenem (MERREM) IVPB Inject 1 g into the vein every 8 (eight) hours. Indication:  ESBL Ecoli bacteremia Last Day of Therapy:  10/01/16 Labs - Once weekly:  CBC/D and BMP, Labs - Every other week:  ESR and CRP Qty: 24 Units, Refills: 0      CONTINUE these medications which have NOT CHANGED   Details  acetaminophen (TYLENOL) 500 MG tablet Take 500 mg by mouth every 6 (six) hours as needed for  mild pain.    albuterol (PROVENTIL HFA;VENTOLIN HFA) 108 (90 Base) MCG/ACT inhaler Inhale 2 puffs into the lungs every 6 (six) hours as needed for wheezing or shortness of breath. Qty: 1 Inhaler, Refills: 0    atorvastatin (LIPITOR) 10 MG tablet Take 1 tablet (10 mg total) by mouth daily. Qty: 90 tablet, Refills: 3    Carbidopa-Levodopa ER (RYTARY) 23.75-95 MG CPCR Take 1 tablet by mouth 3 (three) times daily. Qty: 100 capsule, Refills: 0    celecoxib (CELEBREX) 100 MG capsule Take 1 capsule (100 mg total) by mouth 2 (two) times daily. Qty: 180 capsule, Refills: 3    cetirizine (ZYRTEC) 10 MG tablet Take 1 tablet (10 mg total) by mouth daily. Qty: 30 tablet, Refills: 1   Associated Diagnoses: Allergic rhinitis, unspecified chronicity, unspecified seasonality, unspecified trigger    Cholecalciferol (VITAMIN D3) 5000 UNITS CAPS Take 5,000 Units by mouth daily.    Associated Diagnoses: Embolism (  HCC)    cyanocobalamin 500 MCG tablet Take 500 mcg by mouth daily.    DULoxetine (CYMBALTA) 60 MG capsule Take 1 capsule (60 mg total) by mouth daily. Qty: 90 capsule, Refills: 3    fluticasone (FLONASE) 50 MCG/ACT nasal spray Place 1 spray into both nostrils daily.    magnesium oxide (MAG-OX) 400 (241.3 Mg) MG tablet Take 0.5 tablets (200 mg total) by mouth daily. Qty: 30 tablet, Refills: 0    methocarbamol (ROBAXIN) 500 MG tablet Take 500 mg by mouth every 6 (six) hours as needed for muscle spasms.    omega-3 acid ethyl esters (LOVAZA) 1 g capsule Take 1 g by mouth daily.     ondansetron (ZOFRAN ODT) 4 MG disintegrating tablet Take 1 tablet (4 mg total) by mouth every 8 (eight) hours as needed for nausea or vomiting. Qty: 20 tablet, Refills: 0    Oxycodone HCl 10 MG TABS Take 1/2 tablet daily as needed for neck pain. If pain persists may take another 1/2 tablet per day Qty: 5 tablet, Refills: 0    potassium chloride SA (K-DUR,KLOR-CON) 20 MEQ tablet Take 1 tablet (20 mEq total) by  mouth daily. Qty: 30 tablet, Refills: 0    SYNTHROID 150 MCG tablet Take 1 tablet (150 mcg total) by mouth daily before breakfast. Qty: 90 tablet, Refills: 3    telmisartan-hydrochlorothiazide (MICARDIS HCT) 40-12.5 MG tablet Take 1 tablet by mouth daily. Qty: 90 tablet, Refills: 3    warfarin (COUMADIN) 4 MG tablet Take 2-4 mg by mouth See admin instructions. Take 2 mg by mouth daily on Saturday. Take 4 mg by mouth daily on all other days.    HYDROcodone-acetaminophen (NORCO/VICODIN) 5-325 MG tablet Take 1 tablet by mouth every 6 (six) hours as needed for moderate pain or severe pain. Qty: 10 tablet, Refills: 0      STOP taking these medications     Menthol, Topical Analgesic, (BIOFREEZE EX)      zolpidem (AMBIEN) 5 MG tablet      cephALEXin (KEFLEX) 500 MG capsule        Allergies  Allergen Reactions  . Crestor [Rosuvastatin Calcium] Other (See Comments)    Feeling very bad, aching all over.  . Erythromycin Hives  . Gabapentin Other (See Comments)    Blurred vision  . Pregabalin Other (See Comments)    Crossed vision.  . Carbidopa Other (See Comments)    Headaches, nausea, dizziness  . Macrobid [Nitrofurantoin] Hives  . Losartan Other (See Comments)   Follow-up Information    [facility] Follow up.   Specialty:  Home Health Services Why:  Home Health RN, Physical therapy, occupational therapy and nurse aide Contact information: [place] [address] [phone]        [facility] Follow up.   Why:  to provide IV antibotics from their pharmacy, will contact you to arrange delivery Contact information: [address] [place] [address] [phone]            The results of significant diagnostics from this hospitalization (including imaging, microbiology, ancillary and laboratory) are listed below for reference.    Significant Diagnostic Studies: Ct Head Wo Contrast  Result Date:  09/17/2016 CLINICAL DATA:  Fall.  Confusion. EXAM: CT HEAD WITHOUT CONTRAST TECHNIQUE: Contiguous axial images were obtained from the base of the skull through the vertex without intravenous contrast. COMPARISON:  04/29/2016 head CT. FINDINGS: Brain: No evidence of parenchymal hemorrhage or extra-axial  fluid collection. No mass lesion, mass effect, or midline shift. No CT evidence of acute infarction. Nonspecific moderate subcortical and periventricular white matter hypodensity, most in keeping with chronic small vessel ischemic change. Generalized cerebral volume loss. Cavum septum pellucidum et vergae. No ventriculomegaly. Vascular: No acute abnormality. Skull: No evidence of calvarial fracture. Sinuses/Orbits: No fluid levels. Mild mucoperiosteal thickening and partial opacification of the ethmoidal air cells. Other:  The mastoid air cells are unopacified. IMPRESSION: 1. No evidence of acute intracranial abnormality. No evidence of calvarial fracture. 2. Generalized cerebral volume loss and moderate chronic small vessel ischemic change . Electronically Signed   By: Ilona Sorrel M.D.   On: 09/17/2016 21:53   Dg Chest Port 1 View  Result Date: 09/17/2016 CLINICAL DATA:  71 year old female with history of fever and altered level of consciousness. EXAM: PORTABLE CHEST 1 VIEW COMPARISON:  Chest x-ray 04/29/2016. FINDINGS: Lung volumes are low. Diffuse peribronchial cuffing and widespread interstitial prominence. No confluent consolidative airspace disease. No pleural effusions. No cephalization of the pulmonary vasculature. Heart size is upper limits of normal. The patient is rotated to the left on today's exam, resulting in distortion of the mediastinal contours and reduced diagnostic sensitivity and specificity for mediastinal pathology. Atherosclerosis in the thoracic aorta. IMPRESSION: 1. Diffuse peribronchial cuffing, concerning for an acute bronchitis. 2. Aortic atherosclerosis. Electronically Signed   By:  Vinnie Langton M.D.   On: 09/17/2016 19:40   Dg Foot Complete Right  Result Date: 09/17/2016 CLINICAL DATA:  Pain and swelling in the right foot for 1 week. No reported injury. EXAM: RIGHT FOOT COMPLETE - 3+ VIEW COMPARISON:  None. FINDINGS: Mild diffuse right foot soft tissue swelling. No fracture or dislocation. No suspicious focal osseous lesions. No appreciable cortical erosions or periosteal reaction. Mild osteoarthritis in the distal interphalangeal joints of the third and fourth toes. Small plantar right calcaneal spur. No radiopaque foreign body. IMPRESSION: 1. Mild diffuse right foot soft tissue swelling. No fracture or malalignment. No specific radiographic findings of osteomyelitis. 2. Mild DIP joint osteoarthritis in the right third and fourth toes. 3. Small plantar right calcaneal spur. Electronically Signed   By: Ilona Sorrel M.D.   On: 09/17/2016 14:02    Microbiology: Recent Results (from the past 240 hour(s))  Urine Culture     Status: Abnormal   Collection Time: 09/17/16  7:40 PM  Result Value Ref Range Status   Specimen Description URINE, CATHETERIZED  Final   Special Requests NONE  Final   Culture (A)  Final    30,000 COLONIES/mL ESCHERICHIA COLI Confirmed Extended Spectrum Beta-Lactamase Producer (ESBL)    Report Status 09/20/2016 FINAL  Final   Organism ID, Bacteria ESCHERICHIA COLI (A)  Final      Susceptibility   Escherichia coli - MIC*    AMPICILLIN >=32 RESISTANT Resistant     CEFAZOLIN >=64 RESISTANT Resistant     CEFTRIAXONE >=64 RESISTANT Resistant     CIPROFLOXACIN >=4 RESISTANT Resistant     GENTAMICIN <=1 SENSITIVE Sensitive     IMIPENEM <=0.25 SENSITIVE Sensitive     NITROFURANTOIN <=16 SENSITIVE Sensitive     TRIMETH/SULFA >=320 RESISTANT Resistant     AMPICILLIN/SULBACTAM >=32 RESISTANT Resistant     PIP/TAZO <=4 SENSITIVE Sensitive     Extended ESBL POSITIVE Resistant     * 30,000 COLONIES/mL ESCHERICHIA COLI  Blood Culture (routine x 2)      Status: Abnormal   Collection Time: 09/17/16  7:48 PM  Result Value Ref Range Status  Specimen Description BLOOD RIGHT ANTECUBITAL  Final   Special Requests   Final    BOTTLES DRAWN AEROBIC AND ANAEROBIC Blood Culture adequate volume   Culture  Setup Time   Final    GRAM NEGATIVE RODS IN BOTH AEROBIC AND ANAEROBIC BOTTLES CRITICAL RESULT CALLED TO, READ BACK BY AND VERIFIED WITH: E MARTIN,PHARMD AT 1035 09/18/16 BY L BENFIELD    Culture (A)  Final    ESCHERICHIA COLI Confirmed Extended Spectrum Beta-Lactamase Producer (ESBL)    Report Status 09/20/2016 FINAL  Final   Organism ID, Bacteria ESCHERICHIA COLI  Final      Susceptibility   Escherichia coli - MIC*    AMPICILLIN >=32 RESISTANT Resistant     CEFAZOLIN >=64 RESISTANT Resistant     CEFEPIME >=64 RESISTANT Resistant     CEFTAZIDIME RESISTANT Resistant     CEFTRIAXONE >=64 RESISTANT Resistant     CIPROFLOXACIN >=4 RESISTANT Resistant     GENTAMICIN <=1 SENSITIVE Sensitive     IMIPENEM <=0.25 SENSITIVE Sensitive     TRIMETH/SULFA >=320 RESISTANT Resistant     AMPICILLIN/SULBACTAM >=32 RESISTANT Resistant     PIP/TAZO <=4 SENSITIVE Sensitive     Extended ESBL POSITIVE Resistant     * ESCHERICHIA COLI  Blood Culture ID Panel (Reflexed)     Status: Abnormal   Collection Time: 09/17/16  7:48 PM  Result Value Ref Range Status   Enterococcus species NOT DETECTED NOT DETECTED Final   Listeria monocytogenes NOT DETECTED NOT DETECTED Final   Staphylococcus species NOT DETECTED NOT DETECTED Final   Staphylococcus aureus NOT DETECTED NOT DETECTED Final   Streptococcus species NOT DETECTED NOT DETECTED Final   Streptococcus agalactiae NOT DETECTED NOT DETECTED Final   Streptococcus pneumoniae NOT DETECTED NOT DETECTED Final   Streptococcus pyogenes NOT DETECTED NOT DETECTED Final   Acinetobacter baumannii NOT DETECTED NOT DETECTED Final   Enterobacteriaceae species DETECTED (A) NOT DETECTED Final    Comment: Enterobacteriaceae  represent a large family of gram-negative bacteria, not a single organism. CRITICAL RESULT CALLED TO, READ BACK BY AND VERIFIED WITH: E MARTIN,PHARMD AT 1035 09/18/16 BY L BENFIELD    Enterobacter cloacae complex NOT DETECTED NOT DETECTED Final   Escherichia coli DETECTED (A) NOT DETECTED Final    Comment: CRITICAL RESULT CALLED TO, READ BACK BY AND VERIFIED WITH: E MARTIN,PHARMD AT 1035 09/18/16 BY L BENFIELD    Klebsiella oxytoca NOT DETECTED NOT DETECTED Final   Klebsiella pneumoniae NOT DETECTED NOT DETECTED Final   Proteus species NOT DETECTED NOT DETECTED Final   Serratia marcescens NOT DETECTED NOT DETECTED Final   Carbapenem resistance NOT DETECTED NOT DETECTED Final   Haemophilus influenzae NOT DETECTED NOT DETECTED Final   Neisseria meningitidis NOT DETECTED NOT DETECTED Final   Pseudomonas aeruginosa NOT DETECTED NOT DETECTED Final   Candida albicans NOT DETECTED NOT DETECTED Final   Candida glabrata NOT DETECTED NOT DETECTED Final   Candida krusei NOT DETECTED NOT DETECTED Final   Candida parapsilosis NOT DETECTED NOT DETECTED Final   Candida tropicalis NOT DETECTED NOT DETECTED Final  Blood Culture (routine x 2)     Status: None   Collection Time: 09/17/16  7:54 PM  Result Value Ref Range Status   Specimen Description BLOOD RIGHT ARM  Final   Special Requests   Final    BOTTLES DRAWN AEROBIC AND ANAEROBIC Blood Culture adequate volume   Culture NO GROWTH 5 DAYS  Final   Report Status 09/22/2016 FINAL  Final  MRSA PCR Screening  Status: None   Collection Time: 09/18/16  7:59 AM  Result Value Ref Range Status   MRSA by PCR NEGATIVE NEGATIVE Final    Comment:        The GeneXpert MRSA Assay (FDA approved for NASAL specimens only), is one component of a comprehensive MRSA colonization surveillance program. It is not intended to diagnose MRSA infection nor to guide or monitor treatment for MRSA infections.      Labs: Basic Metabolic Panel:  Recent Labs Lab  09/17/16 1954 09/18/16 0607 09/18/16 0900 09/19/16 0825 09/20/16 0405 09/22/16 0446  NA 133* 139  --  138 140 141  K 3.8 3.4*  --  4.1 4.0 4.3  CL 99* 106  --  109 110 109  CO2 23 26  --  20* 24 28  GLUCOSE 181* 144*  --  123* 122* 109*  BUN 11 11  --  '9 8 8  ' CREATININE 0.83 0.72  --  0.58 0.58 0.64  CALCIUM 9.2 8.7*  --  8.7* 8.7* 8.7*  MG  --   --  1.9  --   --   --    Liver Function Tests:  Recent Labs Lab 09/17/16 1954  AST 32  ALT 26  ALKPHOS 120  BILITOT 1.2  PROT 6.9  ALBUMIN 3.5   No results for input(s): LIPASE, AMYLASE in the last 168 hours. No results for input(s): AMMONIA in the last 168 hours. CBC:  Recent Labs Lab 09/17/16 1954 09/18/16 0607 09/19/16 0825 09/20/16 0405 09/22/16 0446  WBC 17.5* 18.6* 10.6* 5.3 7.1  NEUTROABS 14.4*  --   --   --   --   HGB 14.1 13.1 12.4 11.8* 11.8*  HCT 42.1 40.2 38.3 37.1 36.9  MCV 85.6 86.3 85.9 86.3 86.0  PLT 273 271 211 215 272   Cardiac Enzymes: No results for input(s): CKTOTAL, CKMB, CKMBINDEX, TROPONINI in the last 168 hours. BNP: BNP (last 3 results)  Recent Labs  09/17/16 2342  BNP 40.1    ProBNP (last 3 results) No results for input(s): PROBNP in the last 8760 hours.  CBG:  Recent Labs Lab 09/18/16 0825 09/19/16 0801 09/20/16 0807 09/21/16 0800 09/23/16 0750  GLUCAP 130* 113* 117* 119* 109*       Signed:  Velvet Bathe MD.  [facility] 09/23/2016, 2:25 PM

## 2016-09-23 NOTE — Progress Notes (Signed)
Peripherally Inserted Central Catheter/Midline Placement  The IV Nurse has discussed with the patient and/or persons authorized to consent for the patient, the purpose of this procedure and the potential benefits and risks involved with this procedure.  The benefits include less needle sticks, lab draws from the catheter, and the patient may be discharged home with the catheter. Risks include, but not limited to, infection, bleeding, blood clot (thrombus formation), and puncture of an artery; nerve damage and irregular heartbeat and possibility to perform a PICC exchange if needed/ordered by physician.  Alternatives to this procedure were also discussed.  Bard Power PICC patient education guide, fact sheet on infection prevention and patient information card has been provided to patient /or left at bedside.    PICC/Midline Placement Documentation  PICC Single Lumen 19/16/60 PICC Right Basilic 38 cm 0 cm (Active)  Indication for Insertion or Continuance of Line Home intravenous therapies (PICC only) 09/23/2016 10:15 AM  Exposed Catheter (cm) 0 cm 09/23/2016 10:15 AM  Site Assessment Clean;Dry;Intact 09/23/2016 10:15 AM  Line Status Blood return noted;Flushed;Saline locked 09/23/2016 10:15 AM  Dressing Type Transparent 09/23/2016 10:15 AM  Dressing Status Clean;Dry;Intact;Antimicrobial disc in place 09/23/2016 10:15 AM  Dressing Intervention New dressing 09/23/2016 10:15 AM  Dressing Change Due 09/30/16 09/23/2016 10:15 AM       Aldona Lento L 09/23/2016, 10:41 AM

## 2016-09-23 NOTE — Progress Notes (Signed)
ANTICOAGULATION CONSULT NOTE - Follow Up Consult  Pharmacy Consult for warfarin Indication: hx PE, on coumadin PTA  Allergies  Allergen Reactions  . Crestor [Rosuvastatin Calcium] Other (See Comments)    Feeling very bad, aching all over.  . Erythromycin Hives  . Gabapentin Other (See Comments)    Blurred vision  . Pregabalin Other (See Comments)    Crossed vision.  . Carbidopa Other (See Comments)    Headaches, nausea, dizziness  . Macrobid [Nitrofurantoin] Hives  . Losartan Other (See Comments)    Patient Measurements: Height: 5\' 6"  (167.6 cm) Weight: 190 lb (86.2 kg) IBW/kg (Calculated) : 59.3  Vital Signs: Temp: 98 F (36.7 C) (07/21 0518) Temp Source: Oral (07/21 0518) BP: 159/62 (07/21 0518) Pulse Rate: 69 (07/21 0518)  Labs:  Recent Labs  09/21/16 0251 09/22/16 0446 09/23/16 0415  HGB  --  11.8*  --   HCT  --  36.9  --   PLT  --  272  --   LABPROT 27.8* 28.1* 26.4*  INR 2.54 2.57 2.37  CREATININE  --  0.64  --     Estimated Creatinine Clearance: 69.3 mL/min (by C-G formula based on SCr of 0.64 mg/dL).    Assessment: 71 yo female on warfarin PTA for history of PE. Home dose is 4 mg daily except 2 mg on Saturdays per MD documentation 09/07/16. INR therapeutic at 2.37. CBC stable.  No overt s/s bleeding noted.   Goal of Therapy:  INR 2-3 Monitor platelets by anticoagulation protocol: Yes   Plan:  Warfarin 4 mg po x 1 Monitor daily INR, CBC, clinical course, s/sx of bleed, PO intake, DDI   Thank you for allowing [name] to participate in this patients care.  [employer], Pharm.D., BCPS Clinical Pharmacist Pager: [phone] Clinical phone for 09/23/2016 from 8:30-4:00 is [phone]. After 4pm, please call Main Rx [phone]) for assistance. 09/23/2016 9:22 AM

## 2016-09-23 NOTE — Care Management (Addendum)
[id number] 09-23-16 Jacqlyn Krauss, RN,BSN [phone] Alvis Lemmings to provide the [facility] for patient- [name] [name] notified CM that insurance will only cover generic Invanz, Ertapenem. Generic Medication not in stock. Pharmacist Joelene Millin is aware that d/c IV orders for home will be for IV  Meropenem. Pharmacist to assist with d/c Rx. [facility] will start IV Meropenem on 09-24-16 around noon. [facility] to get the Medication Meropenem for patient. Daughter to provide transportation home to [employer].  No further needs from CM at this time. Bethena Roys, RN,BSN [phone]

## 2016-09-23 NOTE — Progress Notes (Signed)
PHARMACY CONSULT NOTE FOR: Meropenem   OUTPATIENT  PARENTERAL ANTIBIOTIC THERAPY (OPAT)  Indication: ESBL Ecoli bacteremia Regimen: Meropenem 1gm IV q8h End date: 10/01/16   IV antibiotic discharge orders are pended. To discharging provider:  please sign these orders via discharge navigator,  Select New Orders & click on the button choice - Manage This Unsigned Work.   **Please note, patient's insurance would not cover Ertapenem and discharge prescription was changed to Meropenem.  Please disregard Ertapenem orders upon discharge.   Thank you for allowing pharmacy to be a part of this patient's care.  [employer], Pharm.D., BCPS Clinical Pharmacist Pager: [phone] Clinical phone for 09/23/2016 from 8:30-4:00 is [phone]. After 4pm, please call Main Rx [phone]) for assistance. 09/23/2016 9:17 AM

## 2016-09-23 NOTE — Progress Notes (Signed)
[name] [name] to be D/C'd home per MD order.  Discussed with the patient and all questions fully answered.  VSS, Skin clean, dry and intact without evidence of skin break down, no evidence of skin tears noted. Peripheral IV catheter discontinued intact. Site without signs and symptoms of complications. Dressing and pressure applied. PICC line placed and [name], home health to follow  An After Visit Summary was printed and given to the patient. Patient received prescription.  D/c education completed with patient/family including follow up instructions, medication list, d/c activities limitations if indicated, with other d/c instructions as indicated by MD - patient able to verbalize understanding, all questions fully answered.   Patient instructed to return to ED, call 911, or call MD for any changes in condition.   [name], and D/C home via private auto.  Milas Hock 09/23/2016 4:37 PM

## 2016-09-24 DIAGNOSIS — G20 Parkinson's disease: Secondary | ICD-10-CM | POA: Diagnosis not present

## 2016-09-24 DIAGNOSIS — I739 Peripheral vascular disease, unspecified: Secondary | ICD-10-CM | POA: Diagnosis not present

## 2016-09-25 ENCOUNTER — Telehealth: Payer: Self-pay | Admitting: Family Medicine

## 2016-09-25 DIAGNOSIS — G20 Parkinson's disease: Secondary | ICD-10-CM | POA: Diagnosis not present

## 2016-09-25 DIAGNOSIS — I739 Peripheral vascular disease, unspecified: Secondary | ICD-10-CM | POA: Diagnosis not present

## 2016-09-25 NOTE — Telephone Encounter (Signed)
Caller name: Dough  Relation to pt: PT from [facility]  Call back number: [phone]    Reason for call:  Verbal orders for PT 2x 1

## 2016-09-26 ENCOUNTER — Encounter: Payer: Self-pay | Admitting: Psychology

## 2016-09-26 ENCOUNTER — Ambulatory Visit (INDEPENDENT_AMBULATORY_CARE_PROVIDER_SITE_OTHER): Payer: Medicare Other | Admitting: Psychology

## 2016-09-26 ENCOUNTER — Telehealth: Payer: Self-pay | Admitting: *Deleted

## 2016-09-26 DIAGNOSIS — G20 Parkinson's disease: Secondary | ICD-10-CM | POA: Diagnosis not present

## 2016-09-26 DIAGNOSIS — G232 Striatonigral degeneration: Secondary | ICD-10-CM | POA: Diagnosis not present

## 2016-09-26 DIAGNOSIS — S060X0S Concussion without loss of consciousness, sequela: Secondary | ICD-10-CM

## 2016-09-26 DIAGNOSIS — R4189 Other symptoms and signs involving cognitive functions and awareness: Secondary | ICD-10-CM

## 2016-09-26 DIAGNOSIS — G3184 Mild cognitive impairment, so stated: Secondary | ICD-10-CM

## 2016-09-26 DIAGNOSIS — I739 Peripheral vascular disease, unspecified: Secondary | ICD-10-CM | POA: Diagnosis not present

## 2016-09-26 NOTE — Telephone Encounter (Signed)
Received Home Health Resumption of Care Orders; forwarded to covering provider [[place]]/SLS 07/24

## 2016-09-28 ENCOUNTER — Ambulatory Visit: Payer: Medicare Other | Admitting: Family Medicine

## 2016-09-28 DIAGNOSIS — G20 Parkinson's disease: Secondary | ICD-10-CM | POA: Diagnosis not present

## 2016-09-28 DIAGNOSIS — I739 Peripheral vascular disease, unspecified: Secondary | ICD-10-CM | POA: Diagnosis not present

## 2016-09-28 NOTE — Telephone Encounter (Addendum)
Received Physician Orders from [facility] for PT, forwarded to covering provider/SLS 07/26 Received Physician Orders from [facility] for Resumption of Care Order, forwarded to covering provider/SLS 07/26

## 2016-09-29 ENCOUNTER — Ambulatory Visit: Payer: Medicare Other

## 2016-09-29 ENCOUNTER — Encounter: Payer: Self-pay | Admitting: Family Medicine

## 2016-09-29 ENCOUNTER — Ambulatory Visit (INDEPENDENT_AMBULATORY_CARE_PROVIDER_SITE_OTHER): Payer: Medicare Other | Admitting: Family Medicine

## 2016-09-29 VITALS — BP 137/88 | HR 72 | Temp 98.0°F | Wt 200.8 lb

## 2016-09-29 DIAGNOSIS — Z7901 Long term (current) use of anticoagulants: Secondary | ICD-10-CM | POA: Diagnosis not present

## 2016-09-29 DIAGNOSIS — I10 Essential (primary) hypertension: Secondary | ICD-10-CM | POA: Diagnosis not present

## 2016-09-29 DIAGNOSIS — G4700 Insomnia, unspecified: Secondary | ICD-10-CM | POA: Diagnosis not present

## 2016-09-29 DIAGNOSIS — Z452 Encounter for adjustment and management of vascular access device: Secondary | ICD-10-CM

## 2016-09-29 LAB — POCT INR: INR: 2.4

## 2016-09-29 MED ORDER — ZOLPIDEM TARTRATE 5 MG PO TABS: 5.0000 mg | ORAL_TABLET | Freq: Every evening | ORAL | 0 refills | Status: DC | PRN

## 2016-09-29 NOTE — Progress Notes (Signed)
Pre visit review using our clinic tool,if applicable. No additional management support is needed unless otherwise documented below in the visit note.  

## 2016-09-29 NOTE — Patient Instructions (Signed)
Please consider cognitive behavioral therapy. Contact [phone] to schedule an appointment or inquire about cost/insurance coverage.

## 2016-09-29 NOTE — Progress Notes (Signed)
Chief Complaint  Patient presents with  . Hypertension  . Requesting prescription for Ambien    Subjective [name] [name] is a 71 y.o. female who presents for hypertension follow up. She does monitor home blood pressures through home health. Blood pressures ranging from 170's/50's on average when [facility] checks, it was fine this AM pt reports. She is compliant with medications- Micardis- 40-12.5 mg daily. Patient has these side effects of medication: none She is adhering to a healthy diet overall. Current exercise: therapy   Past Medical History:  Diagnosis Date  . Arthritis   . Chronic insomnia   . Complication of anesthesia    severe itching night of surgery requiring meds- also couldnt swallow- throat "paralyzed""  . Fibromyalgia   . Glaucoma   . Hypercholesterolemia   . Hyperlipidemia   . Hypertension    PCP Dr Cari Caraway- [place]  05/15/11 with clearance and note on chart,    chest x ray, EKG 12/12 EPIC, eccho 7/11 EPIC  . Hypothyroidism    s/p graves disease  . Kidney stone   . PD (Parkinson's disease) ([place])   . Peripheral vascular disease ([place])    PULMONARY EMBOLUS x 2- 2011, 2012/ FOLLOWED BY DR ODOGWU-LOV 12/12 EPIC   PT STAES WILL STOP COUMADIN 3/12 and begin LOVONOX 05/17/11 as previously instructed  . Sinus infection    at present- dr Honor Loh PA aware- was seen there and at PCP 05/10/11  . Thyroid disease    Hypothyroidism   Family History  Problem Relation Age of Onset  . Prostate cancer Father   . Stroke Father      Medications Current Outpatient Prescriptions on File Prior to Visit  Medication Sig Dispense Refill  . acetaminophen (TYLENOL) 500 MG tablet Take 500 mg by mouth every 6 (six) hours as needed for mild pain.    Marland Kitchen albuterol (PROVENTIL HFA;VENTOLIN HFA) 108 (90 Base) MCG/ACT inhaler Inhale 2 puffs into the lungs every 6 (six) hours as needed for wheezing or shortness of breath. 1 Inhaler 0  . atorvastatin (LIPITOR) 10 MG tablet Take 1 tablet (10 mg  total) by mouth daily. 90 tablet 3  . Carbidopa-Levodopa ER (RYTARY) 23.75-95 MG CPCR Take 1 tablet by mouth 3 (three) times daily. 100 capsule 0  . celecoxib (CELEBREX) 100 MG capsule Take 1 capsule (100 mg total) by mouth 2 (two) times daily. 180 capsule 3  . cetirizine (ZYRTEC) 10 MG tablet Take 1 tablet (10 mg total) by mouth daily. 30 tablet 1  . Cholecalciferol (VITAMIN D3) 5000 UNITS CAPS Take 5,000 Units by mouth daily.     . cyanocobalamin 500 MCG tablet Take 500 mcg by mouth daily.    . DULoxetine (CYMBALTA) 60 MG capsule Take 1 capsule (60 mg total) by mouth daily. 90 capsule 3  . fluticasone (FLONASE) 50 MCG/ACT nasal spray Place 1 spray into both nostrils daily.    Marland Kitchen HYDROcodone-acetaminophen (NORCO/VICODIN) 5-325 MG tablet Take 1 tablet by mouth every 6 (six) hours as needed for moderate pain or severe pain. 10 tablet 0  . magnesium oxide (MAG-OX) 400 (241.3 Mg) MG tablet Take 0.5 tablets (200 mg total) by mouth daily. 30 tablet 0  . meropenem (MERREM) IVPB Inject 1 g into the vein every 8 (eight) hours. Indication:  ESBL Ecoli bacteremia Last Day of Therapy:  10/01/16 Labs - Once weekly:  CBC/D and BMP, Labs - Every other week:  ESR and CRP 24 Units 0  . methocarbamol (ROBAXIN) 500 MG tablet Take  500 mg by mouth every 6 (six) hours as needed for muscle spasms.    Marland Kitchen omega-3 acid ethyl esters (LOVAZA) 1 g capsule Take 1 g by mouth daily.     . ondansetron (ZOFRAN ODT) 4 MG disintegrating tablet Take 1 tablet (4 mg total) by mouth every 8 (eight) hours as needed for nausea or vomiting. 20 tablet 0  . Oxycodone HCl 10 MG TABS Take 1/2 tablet daily as needed for neck pain. If pain persists may take another 1/2 tablet per day (Patient taking differently: Take 5 mg by mouth See admin instructions. Take 5 mg daily as needed for neck pain. If pain persists may take another 5 mg per day) 5 tablet 0  . potassium chloride SA (K-DUR,KLOR-CON) 20 MEQ tablet Take 1 tablet (20 mEq total) by mouth  daily. 30 tablet 0  . SYNTHROID 150 MCG tablet Take 1 tablet (150 mcg total) by mouth daily before breakfast. 90 tablet 3  . telmisartan-hydrochlorothiazide (MICARDIS HCT) 40-12.5 MG tablet Take 1 tablet by mouth daily. 90 tablet 3  . warfarin (COUMADIN) 4 MG tablet Take 2-4 mg by mouth See admin instructions. Take 2 mg by mouth daily on Saturday. Take 4 mg by mouth daily on all other days.     Allergies Allergies  Allergen Reactions  . Crestor [Rosuvastatin Calcium] Other (See Comments)    Feeling very bad, aching all over.  . Erythromycin Hives  . Gabapentin Other (See Comments)    Blurred vision  . Pregabalin Other (See Comments)    Crossed vision.  . Carbidopa Other (See Comments)    Headaches, nausea, dizziness  . Macrobid [Nitrofurantoin] Hives  . Losartan Other (See Comments)    Review of Systems Cardiovascular: no chest pain Respiratory:  no shortness of breath  Exam BP 137/88   Pulse 72   Temp 98 F (36.7 C) (Oral)   Wt 200 lb 12.8 oz (91.1 kg)   SpO2 96%   BMI 32.41 kg/m  General:  well developed, well nourished, in no apparent distress Skin:  warm, no pallor or diaphoresis Eyes:  pupils equal and round, sclera anicteric without injection Heart :RRR, no bruits, no LE edema Lungs:  clear to auscultation, no accessory muscle use Psych: well oriented with normal range of affect and appropriate judgment/insight  Essential hypertension  PICC (peripherally inserted central catheter) in place  Long term (current) use of anticoagulants - Plan: POCT INR  Insomnia, unspecified type - Plan: zolpidem (AMBIEN) 5 MG tablet  No changes in BP management given today's readings. I wrote an order for her PICC to be removed as she will no longer be receiving infusions after the weekend.  She is trying to come off of Ambien, recommended CBT, number given. Will refill on behalf of reg provider.  Counseled on diet and exercise F/u w reg PCP as needed. The patient voiced  understanding and agreement to the plan.  [place], DO 09/29/16  11:48 AM

## 2016-10-02 ENCOUNTER — Telehealth: Payer: Self-pay | Admitting: *Deleted

## 2016-10-02 DIAGNOSIS — B9620 Unspecified Escherichia coli [E. coli] as the cause of diseases classified elsewhere: Secondary | ICD-10-CM | POA: Diagnosis not present

## 2016-10-02 DIAGNOSIS — R7881 Bacteremia: Secondary | ICD-10-CM | POA: Diagnosis not present

## 2016-10-02 NOTE — Telephone Encounter (Signed)
Received Physician Orders from [facility], forwarded to covering provider/SLS 07/30

## 2016-10-03 ENCOUNTER — Telehealth: Payer: Self-pay | Admitting: *Deleted

## 2016-10-03 NOTE — Telephone Encounter (Signed)
Received Client Medication-Drug Interaction Report for review, forwarded to provider/SLS 07/31

## 2016-10-05 ENCOUNTER — Telehealth: Payer: Self-pay | Admitting: *Deleted

## 2016-10-05 DIAGNOSIS — B9620 Unspecified Escherichia coli [E. coli] as the cause of diseases classified elsewhere: Secondary | ICD-10-CM | POA: Diagnosis not present

## 2016-10-05 DIAGNOSIS — R7881 Bacteremia: Secondary | ICD-10-CM | POA: Diagnosis not present

## 2016-10-05 NOTE — Telephone Encounter (Signed)
Received Home Health Certification and Plan of Care; forwarded to provider/SLS 08/02

## 2016-10-09 ENCOUNTER — Other Ambulatory Visit: Payer: Self-pay | Admitting: Emergency Medicine

## 2016-10-09 ENCOUNTER — Telehealth: Payer: Self-pay | Admitting: Family Medicine

## 2016-10-09 ENCOUNTER — Telehealth: Payer: Self-pay | Admitting: *Deleted

## 2016-10-09 DIAGNOSIS — B9620 Unspecified Escherichia coli [E. coli] as the cause of diseases classified elsewhere: Secondary | ICD-10-CM | POA: Diagnosis not present

## 2016-10-09 DIAGNOSIS — R7881 Bacteremia: Secondary | ICD-10-CM | POA: Diagnosis not present

## 2016-10-09 MED ORDER — WARFARIN SODIUM 4 MG PO TABS: 4.0000 mg | ORAL_TABLET | ORAL | 3 refills | Status: DC

## 2016-10-09 NOTE — Telephone Encounter (Signed)
Pt says that her Rx for Warfarin should have been for 90 days 4 ML   She would like to have Rx sent to pharmacy below. Pt would like a call when completed   CB: [phone]   Pharmacy: [facility] [address] [place]

## 2016-10-09 NOTE — Telephone Encounter (Signed)
Pt's med list shows that she takes between 2mg - 4mg  of Warfarin. Spoke to pt who states that she now takes 4mg  daily Warfarin and would like a 90 day supply. Is it ok to refill?   Pt would also like to know when she should come in to have her INR checked again.    Rx should be sent to [place].

## 2016-10-09 NOTE — Telephone Encounter (Signed)
Lab Results  Component Value Date   INR 2.4 09/29/2016   INR 2.37 09/23/2016   INR 2.57 09/22/2016   PROTIME 32.4 (H) 02/14/2011   PROTIME 36.0 (H) 07/26/2010   Called her back- I refilled her coumadin, she is done with abx, she will come in for an INR in 2 weeks

## 2016-10-09 NOTE — Telephone Encounter (Signed)
Also received fax from [place] home health- apparently they checked her vitals on 7/30 and her pulse was 103 and "irregular."  However pt reports that she feels well, no palpitations. She reports that her VS were checked again today and they were normal EKG from 7/18 shows SR with rate of 81.  She will report if any sx or changes

## 2016-10-09 NOTE — Telephone Encounter (Signed)
Received Physician Orders from [facility]; forwarded to provider/SLS 08/06

## 2016-10-10 ENCOUNTER — Telehealth: Payer: Self-pay | Admitting: Family Medicine

## 2016-10-10 NOTE — Telephone Encounter (Signed)
[phone]  Pharmacy says that pt takes Warfarin and provider prescribed Celebrex, he said that medications interact. He would like to know if pcp is aware and to be advised on if it is okay to give to pt?

## 2016-10-11 ENCOUNTER — Ambulatory Visit: Payer: Medicare Other | Admitting: Family Medicine

## 2016-10-11 ENCOUNTER — Telehealth: Payer: Self-pay | Admitting: Family Medicine

## 2016-10-11 DIAGNOSIS — Z7901 Long term (current) use of anticoagulants: Secondary | ICD-10-CM | POA: Diagnosis not present

## 2016-10-11 DIAGNOSIS — W06XXXA Fall from bed, initial encounter: Secondary | ICD-10-CM | POA: Diagnosis not present

## 2016-10-11 DIAGNOSIS — M79621 Pain in right upper arm: Secondary | ICD-10-CM | POA: Diagnosis not present

## 2016-10-11 DIAGNOSIS — S40021A Contusion of right upper arm, initial encounter: Secondary | ICD-10-CM | POA: Diagnosis not present

## 2016-10-11 DIAGNOSIS — S40011A Contusion of right shoulder, initial encounter: Secondary | ICD-10-CM | POA: Diagnosis not present

## 2016-10-11 NOTE — Telephone Encounter (Signed)
Lab Results  Component Value Date   INR 2.4 09/29/2016   INR 2.37 09/23/2016   INR 2.57 09/22/2016   PROTIME 32.4 (H) 02/14/2011   PROTIME 36.0 (H) 07/26/2010   Yes, we are aware, she has been on this combination for some time and we are monitoring her INR. Ok to [employer] and spoke with pharmacist

## 2016-10-11 NOTE — Telephone Encounter (Signed)
Patient called in said she fell and bruised and would like provider to take a look at it. I informed her provider was booked for today and offered to get her in with someone else. Patient got upset and said her other doctors have worked her in when she had an emergency. Asked if I would see if provider could work her in today. Please advise patient does not want to see anyone else.  [phone] is her call back number

## 2016-10-11 NOTE — Telephone Encounter (Signed)
Spoke to pt. Pt states that she fell yesterday and landed on a metal basket, now her right upper arm is bruised. Pt denies any pain. Added pt to providers schedule for 3:45 pm.

## 2016-10-12 DIAGNOSIS — R7881 Bacteremia: Secondary | ICD-10-CM | POA: Diagnosis not present

## 2016-10-12 DIAGNOSIS — B9620 Unspecified Escherichia coli [E. coli] as the cause of diseases classified elsewhere: Secondary | ICD-10-CM | POA: Diagnosis not present

## 2016-10-13 DIAGNOSIS — R7881 Bacteremia: Secondary | ICD-10-CM | POA: Diagnosis not present

## 2016-10-13 DIAGNOSIS — B9620 Unspecified Escherichia coli [E. coli] as the cause of diseases classified elsewhere: Secondary | ICD-10-CM | POA: Diagnosis not present

## 2016-10-16 ENCOUNTER — Telehealth: Payer: Self-pay | Admitting: *Deleted

## 2016-10-16 NOTE — Telephone Encounter (Signed)
Received Physician Orders from [facility] [x2] for Add-on PT discipline; forwarded to provider/SLS 08/13

## 2016-10-18 DIAGNOSIS — R7881 Bacteremia: Secondary | ICD-10-CM | POA: Diagnosis not present

## 2016-10-18 DIAGNOSIS — B9620 Unspecified Escherichia coli [E. coli] as the cause of diseases classified elsewhere: Secondary | ICD-10-CM | POA: Diagnosis not present

## 2016-10-20 DIAGNOSIS — B9620 Unspecified Escherichia coli [E. coli] as the cause of diseases classified elsewhere: Secondary | ICD-10-CM | POA: Diagnosis not present

## 2016-10-20 DIAGNOSIS — R7881 Bacteremia: Secondary | ICD-10-CM | POA: Diagnosis not present

## 2016-10-24 DIAGNOSIS — B9620 Unspecified Escherichia coli [E. coli] as the cause of diseases classified elsewhere: Secondary | ICD-10-CM | POA: Diagnosis not present

## 2016-10-24 DIAGNOSIS — R7881 Bacteremia: Secondary | ICD-10-CM | POA: Diagnosis not present

## 2016-10-25 DIAGNOSIS — B9620 Unspecified Escherichia coli [E. coli] as the cause of diseases classified elsewhere: Secondary | ICD-10-CM | POA: Diagnosis not present

## 2016-10-25 DIAGNOSIS — R7881 Bacteremia: Secondary | ICD-10-CM | POA: Diagnosis not present

## 2016-10-26 ENCOUNTER — Telehealth: Payer: Self-pay

## 2016-10-26 ENCOUNTER — Ambulatory Visit (INDEPENDENT_AMBULATORY_CARE_PROVIDER_SITE_OTHER): Payer: Medicare Other | Admitting: Family Medicine

## 2016-10-26 ENCOUNTER — Encounter: Payer: Self-pay | Admitting: Family Medicine

## 2016-10-26 VITALS — BP 131/72 | HR 88 | Temp 98.7°F | Ht 66.0 in | Wt 199.6 lb

## 2016-10-26 DIAGNOSIS — F5101 Primary insomnia: Secondary | ICD-10-CM

## 2016-10-26 DIAGNOSIS — Z23 Encounter for immunization: Secondary | ICD-10-CM | POA: Diagnosis not present

## 2016-10-26 DIAGNOSIS — I10 Essential (primary) hypertension: Secondary | ICD-10-CM

## 2016-10-26 DIAGNOSIS — Z1239 Encounter for other screening for malignant neoplasm of breast: Secondary | ICD-10-CM

## 2016-10-26 DIAGNOSIS — E039 Hypothyroidism, unspecified: Secondary | ICD-10-CM

## 2016-10-26 DIAGNOSIS — E2839 Other primary ovarian failure: Secondary | ICD-10-CM | POA: Diagnosis not present

## 2016-10-26 DIAGNOSIS — Z7901 Long term (current) use of anticoagulants: Secondary | ICD-10-CM

## 2016-10-26 DIAGNOSIS — E782 Mixed hyperlipidemia: Secondary | ICD-10-CM

## 2016-10-26 DIAGNOSIS — D539 Nutritional anemia, unspecified: Secondary | ICD-10-CM | POA: Diagnosis not present

## 2016-10-26 DIAGNOSIS — Z1231 Encounter for screening mammogram for malignant neoplasm of breast: Secondary | ICD-10-CM

## 2016-10-26 LAB — LIPID PANEL
Cholesterol: 161 mg/dL (ref 0–200)
HDL: 53.1 mg/dL (ref >39.00)
LDL CALC: 79 mg/dL (ref 0–99)
NONHDL: 107.86
Total CHOL/HDL Ratio: 3
Triglycerides: 145 mg/dL (ref 0.0–149.0)
VLDL: 29 mg/dL (ref 0.0–40.0)

## 2016-10-26 LAB — CBC
HEMATOCRIT: 47.3 % — AB (ref 36.0–46.0)
HEMOGLOBIN: 15.3 g/dL — AB (ref 12.0–15.0)
MCHC: 32.4 g/dL (ref 30.0–36.0)
MCV: 87.4 fl (ref 78.0–100.0)
Platelets: 344 10*3/uL (ref 150.0–400.0)
RBC: 5.41 Mil/uL — ABNORMAL HIGH (ref 3.87–5.11)
RDW: 14.3 % (ref 11.5–15.5)
WBC: 7.3 10*3/uL (ref 4.0–10.5)

## 2016-10-26 LAB — POCT INR: INR: 2.8

## 2016-10-26 LAB — TSH: TSH: 0.26 u[IU]/mL — AB (ref 0.35–4.50)

## 2016-10-26 MED ORDER — SUVOREXANT 5 MG PO TABS: 1.0000 | ORAL_TABLET | Freq: Every day | ORAL | 1 refills | Status: DC

## 2016-10-26 NOTE — Progress Notes (Addendum)
[place] at [facility] [address], [place], [place] [address] [phone]  Date:  10/26/2016   Name:  [name] [name]   DOB:  [birth year]   MRN:  [id number]  PCP:  Darreld Mclean, MD    Chief Complaint: Follow-up; Labs Only; and Coagulation Disorder   History of Present Illness:  [name] [name] is a 71 y.o. very pleasant female patient who presents with the following: Here today to follow-up on her coumadin- she is anticoagulated for history of PE and hypercoagulable state  She did fall and hit her RIGHT arm a couple of weeks ago- went to [facility] and had negative x-ray of her arm.  He still has a large bruise on her arm but it is not painful.  She tripped over something in her room and fell while getting out of bed She will need an INR today Also history of parkinson's disease She is on lipitor 10 mg- NOT taking her lovaza- synthroid 150, micardis and kdur Mammogram:due Colon cancer screening:due Bone density scan:due Flu shot:  Will do today  She is fasting today for labs - would like to do her TSH and lipids today  She has noted a feeling of dizziness- lightheaded- when she first gets up in the morning.  Tends to resolve after she eats something   She has been on cymbalta for a few months and wonders if this might cause her dizziness. She has been on effexor in the past which seemed to help her mood more, and she does not notice cymbalta helping with her pain  She notes that she does not sleep if she does not use [name]- tells me that she is taking a 1/2 tablet most nights, but as she is almost out of the 30 pills she got a month ago suspect she is taking the full 5mg . Expressed concern that this may be contributing to her dizziness, and could have caused her to fall.   She is willing to consider an alternative med She is interested in trying belsomra instead  Lab Results  Component Value Date   INR 2.8 10/26/2016   INR 2.4  09/29/2016   INR 2.37 09/23/2016   PROTIME 32.4 (H) 02/14/2011   PROTIME 36.0 (H) 07/26/2010   Lab Results  Component Value Date   TSH 0.26 (L) 10/26/2016     Chemistry      Component Value Date/Time   NA 141 09/22/2016 0446   NA 145 01/17/2016   K 4.3 09/22/2016 0446   CL 109 09/22/2016 0446   CO2 28 09/22/2016 0446   BUN 8 09/22/2016 0446   BUN 16 01/17/2016   CREATININE 0.64 09/22/2016 0446   GLU 120 01/17/2016      Component Value Date/Time   CALCIUM 8.7 (L) 09/22/2016 0446   ALKPHOS 120 09/17/2016 1954   AST 32 09/17/2016 1954   ALT 26 09/17/2016 1954   BILITOT 1.2 09/17/2016 1954      BP Readings from Last 3 Encounters:  10/26/16 131/72  09/29/16 137/88  09/23/16 (!) 159/62     Patient Active Problem List   Diagnosis Date Noted  . Sepsis ([place]) 09/17/2016  . Acute metabolic encephalopathy 70/03/7492  . Cellulitis of right foot 09/17/2016  . Fall   . UTI (urinary tract infection) 04/29/2016  . SIRS (systemic inflammatory response syndrome) ([place]) 04/29/2016  . Hyperlipidemia 04/29/2016  . Hypothyroidism 04/29/2016  . Hematuria 04/05/2016  . Cough 04/05/2016  .  Benign essential HTN   . Intractable pain 12/17/2015  . Displaced fracture of fifth cervical vertebra ([place]) 12/17/2015  . Gait disturbance 12/17/2015  . Trochanteric bursitis of both hips 09/13/2015  . Chronic low back pain 06/07/2015  . Fibromyalgia syndrome 05/17/2015  . Thyroid disease 05/17/2015  . Parkinson's disease ([place]) 05/17/2015  . Hypercoagulable state ([place]) 02/07/2012  . History of pulmonary embolism 02/07/2012  . S/P right TKA 05/22/2011    Past Medical History:  Diagnosis Date  . Arthritis   . Chronic insomnia   . Complication of anesthesia    severe itching night of surgery requiring meds- also couldnt swallow- throat "paralyzed""  . Fibromyalgia   . Glaucoma   . Hypercholesterolemia   . Hyperlipidemia   . Hypertension    PCP Dr Cari Caraway- [place]  05/15/11 with  clearance and note on chart,    chest x ray, EKG 12/12 EPIC, eccho 7/11 EPIC  . Hypothyroidism    s/p graves disease  . Kidney stone   . PD (Parkinson's disease) ([place])   . Peripheral vascular disease ([place])    PULMONARY EMBOLUS x 2- 2011, 2012/ FOLLOWED BY DR ODOGWU-LOV 12/12 EPIC   PT STAES WILL STOP COUMADIN 3/12 and begin LOVONOX 05/17/11 as previously instructed  . Sinus infection    at present- dr Honor Loh PA aware- was seen there and at PCP 05/10/11  . Thyroid disease    Hypothyroidism    Past Surgical History:  Procedure Laterality Date  . ABDOMINAL HYSTERECTOMY    . APPENDECTOMY    . CATARACT EXTRACTION Bilateral   . CHOLECYSTECTOMY    . COLONOSCOPY    . EXPLORATORY LAPAROTOMY    . JOINT REPLACEMENT     left knee  7/12  . TOTAL KNEE ARTHROPLASTY  05/22/2011   Procedure: TOTAL KNEE ARTHROPLASTY;  Surgeon: Mauri Pole, MD;  Location: [facility]S;  Service: Orthopedics;  Laterality: Right;    Social History  Substance Use Topics  . Smoking status: Former Smoker    Packs/day: 1.00    Quit date: 05/10/1989  . Smokeless tobacco: Never Used  . Alcohol use 0.0 oz/week     Comment: one drink once a month    Family History  Problem Relation Age of Onset  . Prostate cancer Father   . Stroke Father     Allergies  Allergen Reactions  . Crestor [Rosuvastatin Calcium] Other (See Comments)    Feeling very bad, aching all over.  . Erythromycin Hives  . Gabapentin Other (See Comments)    Blurred vision  . Pregabalin Other (See Comments)    Crossed vision.  . Carbidopa Other (See Comments)    Headaches, nausea, dizziness  . Macrobid [Nitrofurantoin] Hives  . Losartan Other (See Comments)    Medication list has been reviewed and updated.  Current Outpatient Prescriptions on File Prior to Visit  Medication Sig Dispense Refill  . acetaminophen (TYLENOL) 500 MG tablet Take 500 mg by mouth every 6 (six) hours as needed for mild pain.    Marland Kitchen albuterol (PROVENTIL HFA;VENTOLIN HFA) 108  (90 Base) MCG/ACT inhaler Inhale 2 puffs into the lungs every 6 (six) hours as needed for wheezing or shortness of breath. 1 Inhaler 0  . atorvastatin (LIPITOR) 10 MG tablet Take 1 tablet (10 mg total) by mouth daily. 90 tablet 3  . Carbidopa-Levodopa ER (RYTARY) 23.75-95 MG CPCR Take 1 tablet by mouth 3 (three) times daily. 100 capsule 0  . celecoxib (CELEBREX) 100 MG capsule Take 1  capsule (100 mg total) by mouth 2 (two) times daily. 180 capsule 3  . cetirizine (ZYRTEC) 10 MG tablet Take 1 tablet (10 mg total) by mouth daily. 30 tablet 1  . Cholecalciferol (VITAMIN D3) 5000 UNITS CAPS Take 5,000 Units by mouth daily.     . cyanocobalamin 500 MCG tablet Take 500 mcg by mouth daily.    . DULoxetine (CYMBALTA) 60 MG capsule Take 1 capsule (60 mg total) by mouth daily. 90 capsule 3  . fluticasone (FLONASE) 50 MCG/ACT nasal spray Place 1 spray into both nostrils daily.    Marland Kitchen HYDROcodone-acetaminophen (NORCO/VICODIN) 5-325 MG tablet Take 1 tablet by mouth every 6 (six) hours as needed for moderate pain or severe pain. 10 tablet 0  . magnesium oxide (MAG-OX) 400 (241.3 Mg) MG tablet Take 0.5 tablets (200 mg total) by mouth daily. 30 tablet 0  . methocarbamol (ROBAXIN) 500 MG tablet Take 500 mg by mouth every 6 (six) hours as needed for muscle spasms.    Marland Kitchen omega-3 acid ethyl esters (LOVAZA) 1 g capsule Take 1 g by mouth daily.     . ondansetron (ZOFRAN ODT) 4 MG disintegrating tablet Take 1 tablet (4 mg total) by mouth every 8 (eight) hours as needed for nausea or vomiting. 20 tablet 0  . Oxycodone HCl 10 MG TABS Take 1/2 tablet daily as needed for neck pain. If pain persists may take another 1/2 tablet per day (Patient taking differently: Take 5 mg by mouth See admin instructions. Take 5 mg daily as needed for neck pain. If pain persists may take another 5 mg per day) 5 tablet 0  . SYNTHROID 150 MCG tablet Take 1 tablet (150 mcg total) by mouth daily before breakfast. 90 tablet 3  .  telmisartan-hydrochlorothiazide (MICARDIS HCT) 40-12.5 MG tablet Take 1 tablet by mouth daily. 90 tablet 3  . warfarin (COUMADIN) 4 MG tablet Take 1 tablet (4 mg total) by mouth See admin instructions. Adjust as directed by MD 90 tablet 3   No current facility-administered medications on file prior to visit.     Review of Systems:  As per HPI- otherwise negative.   Physical Examination: Vitals:   10/26/16 1158  BP: 131/72  Pulse: 88  Temp: 98.7 F (37.1 C)  SpO2: 98%   Vitals:   10/26/16 1158  Weight: 199 lb 9.6 oz (90.5 kg)  Height: 5\' 6"  (1.676 m)   Body mass index is 32.22 kg/m. Ideal Body Weight: Weight in (lb) to have BMI = 25: 154.6  GEN: WDWN, NAD, Non-toxic, A & O x 3, looks well and like her usual self HEENT: Atraumatic, Normocephalic. Neck supple. No masses, No LAD. Ears and Nose: No external deformity. CV: RRR, No M/G/R. No JVD. No thrill. No extra heart sounds. PULM: CTA B, no wheezes, crackles, rhonchi. No retractions. No resp. distress. No accessory muscle use. ABD: S, NT, ND, +BS. No rebound. No HSM. EXTR: No c/c/e NEURO Normal gait for pt- slow, uses a rolling walker PSYCH: Normally interactive. Conversant. Not depressed or anxious appearing.  Calm demeanor.   Lab Results  Component Value Date   INR 2.8 10/26/2016   INR 2.4 09/29/2016   INR 2.37 09/23/2016   PROTIME 32.4 (H) 02/14/2011   PROTIME 36.0 (H) 07/26/2010    Assessment and Plan: Long term (current) use of anticoagulants - Plan: POCT INR  Essential hypertension  Acquired hypothyroidism - Plan: TSH  Screening for breast cancer - Plan: MM SCREENING BREAST TOMO BILATERAL  Estrogen  deficiency - Plan: DG Bone Density  Mixed hyperlipidemia - Plan: Lipid panel  Primary insomnia - Plan: Suvorexant (BELSOMRA) 5 MG TABS  Deficiency anemia - Plan: CBC  Need for influenza vaccination - Plan: Flu vaccine HIGH DOSE PF (Fluzone High dose)  Here today for a recheck visit INR in range  It  was nice to see you again today!   Your INR looks fine- continue coumadin 4 mg once a day We can plan to repeat your INR in about one month I am concerned that [name] may be contributing to your dizziness in the morning- it would be safest to stop this medication We will try Belsomra instead- this will likely be a safer option for you  Let me know how this works!  We will also set you up for a mammogram and bone density scan  We will wait on changing cymbalta to effexor right now as I don't want to make too many changes at once   Signed Lamar Blinks, MD  Received her labs 8/24- letter to pt Her cholesterol looks good Hg/hct are higher than normal for her, and her TSH is slightly suppressed.  Her TSH has been normal for quite a long time on the same dose of synthroid per chart Will have her repeat her TSH and CBC when she comes in for her INR in a month  Results for orders placed or performed in visit on 10/26/16  TSH  Result Value Ref Range   TSH 0.26 (L) 0.35 - 4.50 uIU/mL  Lipid panel  Result Value Ref Range   Cholesterol 161 0 - 200 mg/dL   Triglycerides 145.0 0.0 - 149.0 mg/dL   HDL 53.10 >39.00 mg/dL   VLDL 29.0 0.0 - 40.0 mg/dL   LDL Cholesterol 79 0 - 99 mg/dL   Total CHOL/HDL Ratio 3    NonHDL 107.86   CBC  Result Value Ref Range   WBC 7.3 4.0 - 10.5 K/uL   RBC 5.41 (H) 3.87 - 5.11 Mil/uL   Platelets 344.0 150.0 - 400.0 K/uL   Hemoglobin 15.3 (H) 12.0 - 15.0 g/dL   HCT 47.3 (H) 36.0 - 46.0 %   MCV 87.4 78.0 - 100.0 fl   MCHC 32.4 30.0 - 36.0 g/dL   RDW 14.3 11.5 - 15.5 %  POCT INR  Result Value Ref Range   INR 2.8

## 2016-10-26 NOTE — Telephone Encounter (Signed)
PA initiated via Covermymeds; KEY: [id number]. Awaiting determination.

## 2016-10-26 NOTE — Patient Instructions (Addendum)
It was nice to see you again today!   Your INR looks fine- continue coumadin 4 mg once a day We can plan to repeat your INR in about one month I am concerned that [name] may be contributing to your dizziness in the morning- it would be safest to stop this medication We will try Belsomra instead- this will likely be a safer option for you  Let me know how this works!  We will also set you up for a mammogram and bone density scan  We will wait on changing cymbalta to effexor right now as I don't want to make too many changes at once

## 2016-10-27 ENCOUNTER — Encounter: Payer: Self-pay | Admitting: Family Medicine

## 2016-10-27 DIAGNOSIS — B9620 Unspecified Escherichia coli [E. coli] as the cause of diseases classified elsewhere: Secondary | ICD-10-CM | POA: Diagnosis not present

## 2016-10-27 DIAGNOSIS — R7881 Bacteremia: Secondary | ICD-10-CM | POA: Diagnosis not present

## 2016-10-27 NOTE — Telephone Encounter (Signed)
PA denied- covered alternatives- Rozerem, Silenor

## 2016-10-27 NOTE — Telephone Encounter (Signed)
Caller name: Crystal  Relation to pt: [employer]  Call back number: [phone]    Reason for call:  Christella Scheuermann would like to know if patient tried one of the formulary medication, rozerem, silenor, zaleplon, please advise requesting verbal

## 2016-10-27 NOTE — Telephone Encounter (Signed)
Spoke w/ [facility] Parents at [facility]- informed that I did not see where Pt has tried rozerem, silenor, or zaleplon. Informed office should receive call/fax with informed decision in 24-48 hours.

## 2016-10-31 MED ORDER — RAMELTEON 8 MG PO TABS: 8.0000 mg | ORAL_TABLET | Freq: Every day | ORAL | 1 refills | Status: DC

## 2016-10-31 NOTE — Telephone Encounter (Signed)
Called [place] and [facility]- Belsomra is not covered.  We can try Rozerem- I sent in an rx for her.  Please let me know how it works out  Meds ordered this encounter  Medications  . ramelteon (ROZEREM) 8 MG tablet    Sig: Take 1 tablet (8 mg total) by mouth at bedtime. Use as needed for sleep    Dispense:  30 tablet    Refill:  1

## 2016-11-01 DIAGNOSIS — B9620 Unspecified Escherichia coli [E. coli] as the cause of diseases classified elsewhere: Secondary | ICD-10-CM | POA: Diagnosis not present

## 2016-11-01 DIAGNOSIS — R7881 Bacteremia: Secondary | ICD-10-CM | POA: Diagnosis not present

## 2016-11-03 DIAGNOSIS — B9620 Unspecified Escherichia coli [E. coli] as the cause of diseases classified elsewhere: Secondary | ICD-10-CM | POA: Diagnosis not present

## 2016-11-03 DIAGNOSIS — R7881 Bacteremia: Secondary | ICD-10-CM | POA: Diagnosis not present

## 2016-11-06 ENCOUNTER — Other Ambulatory Visit: Payer: Self-pay | Admitting: Family Medicine

## 2016-11-07 NOTE — Telephone Encounter (Signed)
Called pt- rozerem was $300 Will have to stick with ambien Will refill for her

## 2016-11-09 DIAGNOSIS — B9620 Unspecified Escherichia coli [E. coli] as the cause of diseases classified elsewhere: Secondary | ICD-10-CM | POA: Diagnosis not present

## 2016-11-09 DIAGNOSIS — R7881 Bacteremia: Secondary | ICD-10-CM | POA: Diagnosis not present

## 2016-11-16 DIAGNOSIS — B9620 Unspecified Escherichia coli [E. coli] as the cause of diseases classified elsewhere: Secondary | ICD-10-CM | POA: Diagnosis not present

## 2016-11-16 DIAGNOSIS — R7881 Bacteremia: Secondary | ICD-10-CM | POA: Diagnosis not present

## 2016-11-21 ENCOUNTER — Telehealth: Payer: Self-pay | Admitting: Neurology

## 2016-11-21 NOTE — Telephone Encounter (Signed)
LMOM making patient aware we would definitely fill medication til her appt. She should contact her pharmacy and they will send [name] a request.

## 2016-11-21 NOTE — Telephone Encounter (Signed)
Patient had to reschedule her 12/12/16 appointment due to Dr. [name] being out of the office. She is now scheduled for December. She wanted to know could her Rytary be filled until then or have samples? Please Advise. Thanks

## 2016-11-23 ENCOUNTER — Emergency Department (HOSPITAL_COMMUNITY): Payer: Medicare Other

## 2016-11-23 ENCOUNTER — Inpatient Hospital Stay (HOSPITAL_COMMUNITY): Payer: Medicare Other

## 2016-11-23 ENCOUNTER — Encounter (HOSPITAL_COMMUNITY): Payer: Self-pay

## 2016-11-23 ENCOUNTER — Inpatient Hospital Stay (HOSPITAL_COMMUNITY)
Admission: EM | Admit: 2016-11-23 | Discharge: 2016-11-28 | DRG: 871 | Disposition: A | Discharge status: Home Health | Payer: Medicare Other | Attending: Internal Medicine | Admitting: Internal Medicine

## 2016-11-23 DIAGNOSIS — Z66 Do not resuscitate: Secondary | ICD-10-CM | POA: Diagnosis present

## 2016-11-23 DIAGNOSIS — W19XXXA Unspecified fall, initial encounter: Secondary | ICD-10-CM

## 2016-11-23 DIAGNOSIS — E039 Hypothyroidism, unspecified: Secondary | ICD-10-CM | POA: Diagnosis present

## 2016-11-23 DIAGNOSIS — D6859 Other primary thrombophilia: Secondary | ICD-10-CM | POA: Diagnosis present

## 2016-11-23 DIAGNOSIS — M797 Fibromyalgia: Secondary | ICD-10-CM | POA: Diagnosis present

## 2016-11-23 DIAGNOSIS — N390 Urinary tract infection, site not specified: Secondary | ICD-10-CM

## 2016-11-23 DIAGNOSIS — F5104 Psychophysiologic insomnia: Secondary | ICD-10-CM | POA: Diagnosis present

## 2016-11-23 DIAGNOSIS — Z9841 Cataract extraction status, right eye: Secondary | ICD-10-CM

## 2016-11-23 DIAGNOSIS — Z888 Allergy status to other drugs, medicaments and biological substances status: Secondary | ICD-10-CM

## 2016-11-23 DIAGNOSIS — Z87440 Personal history of urinary (tract) infections: Secondary | ICD-10-CM

## 2016-11-23 DIAGNOSIS — A4151 Sepsis due to Escherichia coli [E. coli]: Secondary | ICD-10-CM | POA: Diagnosis present

## 2016-11-23 DIAGNOSIS — I519 Heart disease, unspecified: Secondary | ICD-10-CM | POA: Diagnosis present

## 2016-11-23 DIAGNOSIS — R531 Weakness: Secondary | ICD-10-CM | POA: Diagnosis not present

## 2016-11-23 DIAGNOSIS — Z79899 Other long term (current) drug therapy: Secondary | ICD-10-CM | POA: Diagnosis not present

## 2016-11-23 DIAGNOSIS — Z87442 Personal history of urinary calculi: Secondary | ICD-10-CM

## 2016-11-23 DIAGNOSIS — E860 Dehydration: Secondary | ICD-10-CM | POA: Diagnosis present

## 2016-11-23 DIAGNOSIS — E7800 Pure hypercholesterolemia, unspecified: Secondary | ICD-10-CM | POA: Diagnosis not present

## 2016-11-23 DIAGNOSIS — G20 Parkinson's disease: Secondary | ICD-10-CM | POA: Diagnosis present

## 2016-11-23 DIAGNOSIS — R296 Repeated falls: Secondary | ICD-10-CM | POA: Diagnosis present

## 2016-11-23 DIAGNOSIS — R748 Abnormal levels of other serum enzymes: Secondary | ICD-10-CM | POA: Diagnosis present

## 2016-11-23 DIAGNOSIS — Z1612 Extended spectrum beta lactamase (ESBL) resistance: Secondary | ICD-10-CM | POA: Diagnosis present

## 2016-11-23 DIAGNOSIS — I272 Pulmonary hypertension, unspecified: Secondary | ICD-10-CM | POA: Diagnosis present

## 2016-11-23 DIAGNOSIS — Z96652 Presence of left artificial knee joint: Secondary | ICD-10-CM | POA: Diagnosis present

## 2016-11-23 DIAGNOSIS — I10 Essential (primary) hypertension: Secondary | ICD-10-CM | POA: Diagnosis present

## 2016-11-23 DIAGNOSIS — M79671 Pain in right foot: Secondary | ICD-10-CM | POA: Diagnosis present

## 2016-11-23 DIAGNOSIS — R404 Transient alteration of awareness: Secondary | ICD-10-CM | POA: Diagnosis not present

## 2016-11-23 DIAGNOSIS — I739 Peripheral vascular disease, unspecified: Secondary | ICD-10-CM | POA: Diagnosis present

## 2016-11-23 DIAGNOSIS — Y92129 Unspecified place in nursing home as the place of occurrence of the external cause: Secondary | ICD-10-CM | POA: Diagnosis not present

## 2016-11-23 DIAGNOSIS — A419 Sepsis, unspecified organism: Secondary | ICD-10-CM | POA: Diagnosis present

## 2016-11-23 DIAGNOSIS — Z9842 Cataract extraction status, left eye: Secondary | ICD-10-CM | POA: Diagnosis not present

## 2016-11-23 DIAGNOSIS — H409 Unspecified glaucoma: Secondary | ICD-10-CM | POA: Diagnosis present

## 2016-11-23 DIAGNOSIS — W19XXXS Unspecified fall, sequela: Secondary | ICD-10-CM | POA: Diagnosis not present

## 2016-11-23 DIAGNOSIS — G9341 Metabolic encephalopathy: Secondary | ICD-10-CM | POA: Diagnosis present

## 2016-11-23 DIAGNOSIS — Z86711 Personal history of pulmonary embolism: Secondary | ICD-10-CM | POA: Diagnosis not present

## 2016-11-23 DIAGNOSIS — Z87891 Personal history of nicotine dependence: Secondary | ICD-10-CM

## 2016-11-23 DIAGNOSIS — S0990XA Unspecified injury of head, initial encounter: Secondary | ICD-10-CM | POA: Diagnosis not present

## 2016-11-23 HISTORY — DX: Urinary tract infection, site not specified: N39.0

## 2016-11-23 HISTORY — DX: Personal history of pulmonary embolism: Z86.711

## 2016-11-23 HISTORY — DX: Heart disease, unspecified: I51.9

## 2016-11-23 HISTORY — DX: Pulmonary hypertension, unspecified: I27.20

## 2016-11-23 LAB — CBC WITH DIFFERENTIAL/PLATELET
BASOS PCT: 0 %
Basophils Absolute: 0 10*3/uL (ref 0.0–0.1)
EOS ABS: 0 10*3/uL (ref 0.0–0.7)
Eosinophils Relative: 0 %
HCT: 40.9 % (ref 36.0–46.0)
HEMOGLOBIN: 13.7 g/dL (ref 12.0–15.0)
LYMPHS ABS: 1.1 10*3/uL (ref 0.7–4.0)
Lymphocytes Relative: 4 %
MCH: 28.2 pg (ref 26.0–34.0)
MCHC: 33.5 g/dL (ref 30.0–36.0)
MCV: 84.3 fL (ref 78.0–100.0)
Monocytes Absolute: 2.7 10*3/uL — ABNORMAL HIGH (ref 0.1–1.0)
Monocytes Relative: 11 %
NEUTROS PCT: 85 %
Neutro Abs: 20.2 10*3/uL — ABNORMAL HIGH (ref 1.7–7.7)
Platelets: 230 10*3/uL (ref 150–400)
RBC: 4.85 MIL/uL (ref 3.87–5.11)
RDW: 13.4 % (ref 11.5–15.5)
WBC: 24 10*3/uL — AB (ref 4.0–10.5)

## 2016-11-23 LAB — BASIC METABOLIC PANEL
Anion gap: 11 (ref 5–15)
BUN: 20 mg/dL (ref 6–20)
CHLORIDE: 98 mmol/L — AB (ref 101–111)
CO2: 26 mmol/L (ref 22–32)
Calcium: 9.3 mg/dL (ref 8.9–10.3)
Creatinine, Ser: 0.85 mg/dL (ref 0.44–1.00)
GFR calc non Af Amer: >60 mL/min (ref >60)
Glucose, Bld: 135 mg/dL — ABNORMAL HIGH (ref 65–99)
POTASSIUM: 4 mmol/L (ref 3.5–5.1)
Sodium: 135 mmol/L (ref 135–145)

## 2016-11-23 LAB — URINALYSIS, ROUTINE W REFLEX MICROSCOPIC
BILIRUBIN URINE: NEGATIVE
Glucose, UA: NEGATIVE mg/dL
KETONES UR: 5 mg/dL — AB
Nitrite: NEGATIVE
Protein, ur: 100 mg/dL — AB
SPECIFIC GRAVITY, URINE: 1.011 (ref 1.005–1.030)
pH: 6 (ref 5.0–8.0)

## 2016-11-23 LAB — PROTIME-INR
INR: 1.59
Prothrombin Time: 18.8 seconds — ABNORMAL HIGH (ref 11.4–15.2)

## 2016-11-23 LAB — CK
CK TOTAL: 200 U/L (ref 38–234)
Total CK: 300 U/L — ABNORMAL HIGH (ref 38–234)

## 2016-11-23 LAB — MAGNESIUM: MAGNESIUM: 2 mg/dL (ref 1.7–2.4)

## 2016-11-23 LAB — PROCALCITONIN: Procalcitonin: 3.59 ng/mL

## 2016-11-23 LAB — LACTIC ACID, PLASMA: Lactic Acid, Venous: 1.5 mmol/L (ref 0.5–1.9)

## 2016-11-23 MED ORDER — SODIUM CHLORIDE 0.9 % IV SOLN
INTRAVENOUS | Status: DC
  Administered 2016-11-23: 100 mL/h via INTRAVENOUS
  Administered 2016-11-25: 04:00:00 via INTRAVENOUS

## 2016-11-23 MED ORDER — ENOXAPARIN SODIUM 100 MG/ML ~~LOC~~ SOLN
90.0000 mg | Freq: Two times a day (BID) | SUBCUTANEOUS | Status: AC
  Administered 2016-11-23 – 2016-11-25 (×5): 90 mg via SUBCUTANEOUS
  Filled 2016-11-23: qty 1
  Filled 2016-11-23: qty 0.9
  Filled 2016-11-23 (×3): qty 1

## 2016-11-23 MED ORDER — DEXTROSE 5 % IV SOLN
1.0000 g | Freq: Once | INTRAVENOUS | Status: AC
  Administered 2016-11-23: 1 g via INTRAVENOUS
  Filled 2016-11-23: qty 10

## 2016-11-23 MED ORDER — WARFARIN - PHARMACIST DOSING INPATIENT: Freq: Every day | Status: DC

## 2016-11-23 MED ORDER — ACETAMINOPHEN 325 MG PO TABS
650.0000 mg | ORAL_TABLET | Freq: Four times a day (QID) | ORAL | Status: DC | PRN
  Administered 2016-11-23: 650 mg via ORAL
  Filled 2016-11-23: qty 2

## 2016-11-23 MED ORDER — ONDANSETRON HCL 4 MG/2ML IJ SOLN: 4.0000 mg | Freq: Four times a day (QID) | INTRAMUSCULAR | Status: DC | PRN

## 2016-11-23 MED ORDER — ACETAMINOPHEN 650 MG RE SUPP: 650.0000 mg | Freq: Four times a day (QID) | RECTAL | Status: DC | PRN

## 2016-11-23 MED ORDER — ALBUTEROL SULFATE HFA 108 (90 BASE) MCG/ACT IN AERS: 2.0000 | INHALATION_SPRAY | Freq: Four times a day (QID) | RESPIRATORY_TRACT | Status: DC | PRN

## 2016-11-23 MED ORDER — SODIUM CHLORIDE 0.9 % IV SOLN
1.0000 g | INTRAVENOUS | Status: AC
  Administered 2016-11-23: 1 g via INTRAVENOUS
  Filled 2016-11-23: qty 1

## 2016-11-23 MED ORDER — MEROPENEM 1 G IV SOLR
1.0000 g | Freq: Three times a day (TID) | INTRAVENOUS | Status: DC
  Administered 2016-11-23: 21:00:00 via INTRAVENOUS
  Administered 2016-11-24 – 2016-11-28 (×13): 1 g via INTRAVENOUS
  Filled 2016-11-23 (×17): qty 1

## 2016-11-23 MED ORDER — ORAL CARE MOUTH RINSE
15.0000 mL | Freq: Two times a day (BID) | OROMUCOSAL | Status: DC
  Administered 2016-11-24 – 2016-11-28 (×6): 15 mL via OROMUCOSAL

## 2016-11-23 MED ORDER — ALBUTEROL SULFATE (2.5 MG/3ML) 0.083% IN NEBU: 2.5000 mg | INHALATION_SOLUTION | Freq: Four times a day (QID) | RESPIRATORY_TRACT | Status: DC | PRN

## 2016-11-23 MED ORDER — CELECOXIB 100 MG PO CAPS
100.0000 mg | ORAL_CAPSULE | Freq: Two times a day (BID) | ORAL | Status: DC
  Administered 2016-11-23 – 2016-11-28 (×10): 100 mg via ORAL
  Filled 2016-11-23 (×11): qty 1

## 2016-11-23 MED ORDER — WARFARIN SODIUM 6 MG PO TABS
6.0000 mg | ORAL_TABLET | Freq: Once | ORAL | Status: AC
  Administered 2016-11-23: 6 mg via ORAL
  Filled 2016-11-23 (×2): qty 1

## 2016-11-23 MED ORDER — CHLORHEXIDINE GLUCONATE 0.12 % MT SOLN
15.0000 mL | Freq: Two times a day (BID) | OROMUCOSAL | Status: DC
  Administered 2016-11-23 – 2016-11-28 (×10): 15 mL via OROMUCOSAL
  Filled 2016-11-23 (×8): qty 15

## 2016-11-23 MED ORDER — LEVOTHYROXINE SODIUM 150 MCG PO TABS
150.0000 ug | ORAL_TABLET | Freq: Every day | ORAL | Status: DC
  Administered 2016-11-24 – 2016-11-28 (×5): 150 ug via ORAL
  Filled 2016-11-23 (×5): qty 1

## 2016-11-23 MED ORDER — ONDANSETRON HCL 4 MG PO TABS: 4.0000 mg | ORAL_TABLET | Freq: Four times a day (QID) | ORAL | Status: DC | PRN

## 2016-11-23 MED ORDER — CARBIDOPA-LEVODOPA ER 25-100 MG PO TBCR
1.0000 | EXTENDED_RELEASE_TABLET | Freq: Three times a day (TID) | ORAL | Status: DC
  Administered 2016-11-23 – 2016-11-28 (×16): 1 via ORAL
  Filled 2016-11-23 (×17): qty 1

## 2016-11-23 MED ORDER — DULOXETINE HCL 30 MG PO CPEP
60.0000 mg | ORAL_CAPSULE | Freq: Every day | ORAL | Status: DC
  Administered 2016-11-24 – 2016-11-28 (×5): 60 mg via ORAL
  Filled 2016-11-23 (×5): qty 2

## 2016-11-23 MED ORDER — CARBIDOPA-LEVODOPA ER 23.75-95 MG PO CPCR: 1.0000 | ORAL_CAPSULE | Freq: Three times a day (TID) | ORAL | Status: DC

## 2016-11-23 MED ORDER — SODIUM CHLORIDE 0.9% FLUSH
3.0000 mL | Freq: Two times a day (BID) | INTRAVENOUS | Status: DC
  Administered 2016-11-23 – 2016-11-28 (×6): 3 mL via INTRAVENOUS

## 2016-11-23 NOTE — Progress Notes (Signed)
Pharmacy Antibiotic Note  [name] [name] is a 71 y.o. female admitted on 11/23/2016 with sepsis secondary to recurrent UTI. With history of ESBL infection, patient placed on Meropenem. Pharmacy has been consulted to assist with dosing of Meropenem.  Plan: Meropenem 1g IV q8h. Monitor renal function, cultures, clinical course.   Height: 5\' 5"  (165.1 cm) Weight: 200 lb (90.7 kg) IBW/kg (Calculated) : 57  Temp (24hrs), Avg:99.5 F (37.5 C), Min:97.8 F (36.6 C), Max:101.1 F (38.4 C)   Recent Labs Lab 11/23/16 0221  WBC 24.0*  CREATININE 0.85    Estimated Creatinine Clearance: 65.6 mL/min (by C-G formula based on SCr of 0.85 mg/dL).    Allergies  Allergen Reactions  . Crestor [Rosuvastatin Calcium] Other (See Comments)    Feeling very bad, aching all over.  . Erythromycin Hives  . Gabapentin Other (See Comments)    Blurred vision  . Pregabalin Other (See Comments)    Crossed vision.  . Carbidopa Other (See Comments)    Headaches, nausea, dizziness  . Macrobid [Nitrofurantoin] Hives  . Losartan Itching    Antimicrobials this admission: 9/20 >> Ceftriaxone x 1 9/20 >> Meropenem >>  Dose adjustments this admission: --  Microbiology results: 9/20 BCx: ordered 9/20 UCx: ordered     Thank you for allowing pharmacy to be a part of this patient's care.   Lindell Spar, PharmD, BCPS Pager: [phone] 11/23/2016 10:54 AM

## 2016-11-23 NOTE — ED Notes (Signed)
Lab add on ck

## 2016-11-23 NOTE — ED Notes (Signed)
Bed: [facility] Expected date:  Expected time:  Means of arrival:  Comments: EMS fall/"doesn't feel right"

## 2016-11-23 NOTE — ED Provider Notes (Signed)
[facility] DEPT Provider Note   CSN: [id number] Arrival date & time: 11/23/16  0159     History   Chief Complaint Chief Complaint  Patient presents with  . Fall    HPI [name] [name] is a 71 y.o. female.  Patient from [facility] with history of Parkinson's disease, urinary tract infection, fibromyalgia on chronic pain medications, PE on Coumadin -- presents from home after a fall onto her right side at approximately 11PM. Patient states that she slid out of bed. She does not think she hit her head denies headache or neck pain. States that she laid on the floor for 1-2 hours before being able to call for help. Denies any acute medical complaints but states that she doesn't feel right. She reports another fall 3 days ago which is around when she started feeling bad. Denies dysuria or hematuria. Denies chest pain or cough. No abdominal pain, vomiting, or diarrhea. No skin rashes reported. The onset of this condition was acute. The course is constant. Aggravating factors: none. Alleviating factors: none.        Past Medical History:  Diagnosis Date  . Arthritis   . Chronic insomnia   . Complication of anesthesia    severe itching night of surgery requiring meds- also couldnt swallow- throat "paralyzed""  . Fibromyalgia   . Glaucoma   . Hypercholesterolemia   . Hyperlipidemia   . Hypertension    PCP Dr Cari Caraway- [place]  05/15/11 with clearance and note on chart,    chest x ray, EKG 12/12 EPIC, eccho 7/11 EPIC  . Hypothyroidism    s/p graves disease  . Kidney stone   . PD (Parkinson's disease) ([place])   . Peripheral vascular disease ([place])    PULMONARY EMBOLUS x 2- 2011, 2012/ FOLLOWED BY DR ODOGWU-LOV 12/12 EPIC   PT STAES WILL STOP COUMADIN 3/12 and begin LOVONOX 05/17/11 as previously instructed  . Sinus infection    at present- dr Honor Loh PA aware- was seen there and at PCP 05/10/11  . Thyroid disease    Hypothyroidism    Patient Active Problem List   Diagnosis Date  Noted  . Sepsis ([place]) 09/17/2016  . Acute metabolic encephalopathy 29/52/8413  . Cellulitis of right foot 09/17/2016  . Fall   . UTI (urinary tract infection) 04/29/2016  . SIRS (systemic inflammatory response syndrome) ([place]) 04/29/2016  . Hyperlipidemia 04/29/2016  . Hypothyroidism 04/29/2016  . Hematuria 04/05/2016  . Cough 04/05/2016  . Benign essential HTN   . Intractable pain 12/17/2015  . Displaced fracture of fifth cervical vertebra ([place]) 12/17/2015  . Gait disturbance 12/17/2015  . Trochanteric bursitis of both hips 09/13/2015  . Chronic low back pain 06/07/2015  . Fibromyalgia syndrome 05/17/2015  . Thyroid disease 05/17/2015  . Parkinson's disease ([place]) 05/17/2015  . Hypercoagulable state ([place]) 02/07/2012  . History of pulmonary embolism 02/07/2012  . S/P right TKA 05/22/2011    Past Surgical History:  Procedure Laterality Date  . ABDOMINAL HYSTERECTOMY    . APPENDECTOMY    . CATARACT EXTRACTION Bilateral   . CHOLECYSTECTOMY    . COLONOSCOPY    . EXPLORATORY LAPAROTOMY    . JOINT REPLACEMENT     left knee  7/12  . TOTAL KNEE ARTHROPLASTY  05/22/2011   Procedure: TOTAL KNEE ARTHROPLASTY;  Surgeon: Mauri Pole, MD;  Location: [facility] ORS;  Service: Orthopedics;  Laterality: Right;    OB History    No data available       Home Medications  Prior to Admission medications   Medication Sig Start Date End Date Taking? Authorizing Provider  albuterol (PROVENTIL HFA;VENTOLIN HFA) 108 (90 Base) MCG/ACT inhaler Inhale 2 puffs into the lungs every 6 (six) hours as needed for wheezing or shortness of breath. 04/07/16  Yes Hoyt Koch, MD  atorvastatin (LIPITOR) 10 MG tablet Take 1 tablet (10 mg total) by mouth daily. 08/16/16  Yes [name], [name], MD  Carbidopa-Levodopa ER (RYTARY) 23.75-95 MG CPCR Take 1 tablet by mouth 3 (three) times daily. 09/07/16  Yes Tat, Eustace Quail, DO  celecoxib (CELEBREX) 100 MG capsule Take 1 capsule (100 mg total) by mouth 2 (two)  times daily. 07/09/16  Yes [name], [name], MD  cetirizine (ZYRTEC) 10 MG tablet Take 1 tablet (10 mg total) by mouth daily. 04/17/16  Yes Shelda Pal, DO  DULoxetine (CYMBALTA) 60 MG capsule Take 1 capsule (60 mg total) by mouth daily. 08/31/16  Yes [name], [name], MD  HYDROcodone-acetaminophen (NORCO/VICODIN) 5-325 MG tablet Take 1 tablet by mouth every 6 (six) hours as needed for moderate pain or severe pain. 09/17/16  Yes Phelps, Erin O, PA-C  magnesium oxide (MAG-OX) 400 (241.3 Mg) MG tablet Take 0.5 tablets (200 mg total) by mouth daily. 12/21/15  Yes Robbie Lis, MD  ondansetron (ZOFRAN ODT) 4 MG disintegrating tablet Take 1 tablet (4 mg total) by mouth every 8 (eight) hours as needed for nausea or vomiting. 06/27/16  Yes Saguier, Percell Miller, PA-C  Oxycodone HCl 10 MG TABS Take 1/2 tablet daily as needed for neck pain. If pain persists may take another 1/2 tablet per day Patient taking differently: Take 5 mg by mouth See admin instructions. Take 5 mg daily as needed for neck pain. If pain persists may take another 5 mg per day 05/03/16  Yes Ghimire, Henreitta Leber, MD  SYNTHROID 150 MCG tablet Take 1 tablet (150 mcg total) by mouth daily before breakfast. 07/09/16  Yes [name], [name], MD  telmisartan-hydrochlorothiazide (MICARDIS HCT) 40-12.5 MG tablet Take 1 tablet by mouth daily. 07/09/16  Yes [name], [name], MD  warfarin (COUMADIN) 4 MG tablet Take 1 tablet (4 mg total) by mouth See admin instructions. Adjust as directed by MD 10/09/16  Yes [name], [name], MD  zolpidem (AMBIEN) 5 MG tablet TAKE 1 TABLET BY MOUTH ONCE DAILY AT BEDTIME AS NEEDED FOR SLEEP 11/07/16  Yes [name], [name], MD  acetaminophen (TYLENOL) 500 MG tablet Take 500 mg by mouth every 6 (six) hours as needed for mild pain.    [provider]  Cholecalciferol (VITAMIN D3) 5000 UNITS CAPS Take 5,000 Units by mouth daily.     [provider]  cyanocobalamin 500 MCG tablet Take 500 mcg by mouth  daily.    [provider]  fluticasone (FLONASE) 50 MCG/ACT nasal spray Place 1 spray into both nostrils daily.    [provider]  methocarbamol (ROBAXIN) 500 MG tablet Take 500 mg by mouth every 6 (six) hours as needed for muscle spasms.    [provider]  omega-3 acid ethyl esters (LOVAZA) 1 g capsule Take 1 g by mouth daily.     [provider]    Family History Family History  Problem Relation Age of Onset  . Prostate cancer Father   . Stroke Father     Social History Social History  Substance Use Topics  . Smoking status: Former Smoker    Packs/day: 1.00    Quit date: 05/10/1989  . Smokeless tobacco:  Never Used  . Alcohol use 0.0 oz/week     Comment: one drink once a month     Allergies   Crestor [rosuvastatin calcium]; Erythromycin; Gabapentin; Pregabalin; Carbidopa; Macrobid [nitrofurantoin]; and Losartan   Review of Systems Review of Systems  Constitutional: Positive for fatigue. Negative for fever.  HENT: Negative for rhinorrhea, sore throat and tinnitus.   Eyes: Negative for photophobia, pain, redness and visual disturbance.  Respiratory: Negative for cough and shortness of breath.   Cardiovascular: Negative for chest pain.  Gastrointestinal: Negative for abdominal pain, diarrhea, nausea and vomiting.  Genitourinary: Negative for dysuria.  Musculoskeletal: Positive for arthralgias. Negative for back pain, gait problem, myalgias and neck pain.  Skin: Negative for rash and wound.  Neurological: Positive for weakness. Negative for dizziness, light-headedness, numbness and headaches.  Psychiatric/Behavioral: Negative for confusion and decreased concentration.     Physical Exam Updated Vital Signs BP 136/67   Pulse 74   Temp (!) 101.1 F (38.4 C) (Rectal)   Resp 15   Ht 5\' 5"  (1.651 m)   Wt 90.7 kg (200 lb)   SpO2 100%   BMI 33.28 kg/m   Physical Exam  Constitutional: She appears well-developed and well-nourished.    HENT:  Head: Normocephalic and atraumatic.  Mouth/Throat: Oropharynx is clear and moist.  Eyes: Conjunctivae are normal. Right eye exhibits no discharge. Left eye exhibits no discharge.  Neck: Normal range of motion. Neck supple.  Cardiovascular: Normal rate, regular rhythm and normal heart sounds.   Pulmonary/Chest: Effort normal and breath sounds normal. No respiratory distress. She has no wheezes. She has no rales.  Abdominal: Soft. There is no tenderness. There is no rebound and no guarding.  Musculoskeletal: She exhibits tenderness. She exhibits no edema.  Can range shoulders and upper extremities bilaterally. No pain with palpation over hips.   Neurological: She is alert.  Skin: Skin is warm and dry.  Psychiatric: She has a normal mood and affect.  Nursing note and vitals reviewed.    ED Treatments / Results  Labs (all labs ordered are listed, but only abnormal results are displayed) Labs Reviewed  URINALYSIS, ROUTINE W REFLEX MICROSCOPIC - Abnormal; Notable for the following:       Result Value   Color, Urine AMBER (*)    APPearance CLOUDY (*)    Hgb urine dipstick LARGE (*)    Ketones, ur 5 (*)    Protein, ur 100 (*)    Leukocytes, UA LARGE (*)    Bacteria, UA MANY (*)    Squamous Epithelial / LPF 0-5 (*)    All other components within normal limits  CBC WITH DIFFERENTIAL/PLATELET - Abnormal; Notable for the following:    WBC 24.0 (*)    Neutro Abs 20.2 (*)    Monocytes Absolute 2.7 (*)    All other components within normal limits  BASIC METABOLIC PANEL - Abnormal; Notable for the following:    Chloride 98 (*)    Glucose, Bld 135 (*)    All other components within normal limits  PROTIME-INR - Abnormal; Notable for the following:    Prothrombin Time 18.8 (*)    All other components within normal limits  CK - Abnormal; Notable for the following:    Total CK 300 (*)    All other components within normal limits  URINE CULTURE     Radiology Dg Chest 2  View  Result Date: 11/23/2016 CLINICAL DATA:  Patient fell at 2300 hours. Multiple falls over the past few weeks.  History of Parkinson's and fibromyalgia. Generalize pain. Lethargy. EXAM: CHEST  2 VIEW COMPARISON:  None. FINDINGS: Shallow inspiration. Normal heart size and pulmonary vascularity. No focal airspace disease or consolidation in the lungs. No blunting of costophrenic angles. No pneumothorax. Mediastinal contours appear intact. Degenerative changes in the spine. Calcification of the aorta. Surgical clips in the right upper quadrant. IMPRESSION: No active cardiopulmonary disease. Electronically Signed   By: Lucienne Capers M.D.   On: 11/23/2016 06:57   Ct Head Wo Contrast  Result Date: 11/23/2016 CLINICAL DATA:  Head trauma. " "Does not feel right" . Initial encounter. EXAM: CT HEAD WITHOUT CONTRAST TECHNIQUE: Contiguous axial images were obtained from the base of the skull through the vertex without intravenous contrast. COMPARISON:  09/17/2016 FINDINGS: Brain: No evidence of acute infarction, hemorrhage, hydrocephalus, extra-axial collection or mass lesion/mass effect. Chronic ventriculomegaly that is likely atrophy. Chronic small vessel ischemia with thin confluent band in the periventricular white matter. Cavum septum pellucidum et vergae, incidental. Vascular: Atherosclerotic calcification Skull: No acute or aggressive finding. Sinuses/Orbits: No evidence of injury. Bilateral cataract resection. IMPRESSION: 1. No evidence of intracranial injury.  Stable compared to prior. 2. Atrophy and mild chronic small vessel ischemia. Electronically Signed   By: Monte Fantasia M.D.   On: 11/23/2016 07:08    Procedures Procedures (including critical care time)  Medications Ordered in ED Medications  cefTRIAXone (ROCEPHIN) 1 g in dextrose 5 % 50 mL IVPB (not administered)     Initial Impression / Assessment and Plan / ED Course  I have reviewed the triage vital signs and the nursing  notes.  Pertinent labs & imaging results that were available during my care of the patient were reviewed by me and considered in my medical decision making (see chart for details).     Patient seen and examined. Work-up initiated. Medications ordered.   Vital signs reviewed and are as follows: BP 136/67   Pulse 74   Temp (!) 101.1 F (38.4 C) (Rectal)   Resp 15   Ht 5\' 5"  (1.651 m)   Wt 90.7 kg (200 lb)   SpO2 100%   BMI 33.28 kg/m   9:16 AM Will admit for febrile UTI. Rocephin ordered. Patient discussed with and seen by Dr. Roderic Palau.   Spoke with Dr. Allyson Sabal who will see.   Final Clinical Impressions(s) / ED Diagnoses   Final diagnoses:  Urinary tract infection without hematuria, site unspecified  Fall, initial encounter   Admit.   New Prescriptions New Prescriptions   No medications on file     Carlisle Cater, Hershal Coria 11/23/16 1751    Milton Ferguson, MD 11/24/16 1241

## 2016-11-23 NOTE — Clinical Social Work Note (Cosign Needed)
Clinical Social Work Assessment  Patient Details  Name: [name] [name] MRN: [id number] Date of Birth: 07-16-1945  Date of referral:  11/23/16               Reason for consult:  Discharge Planning                Permission sought to share information with:    Permission granted to share information::     Name::        Agency::     Relationship::     Contact Information:     Housing/Transportation Living arrangements for the past 2 months:  [place] ([place]) Source of Information:  Patient Patient Interpreter Needed:  None Criminal Activity/Legal Involvement Pertinent to Current Situation/Hospitalization:    Significant Relationships:  Adult Children (patients son, [name]) Lives with:  Self Do you feel safe going back to the place where you live?  Yes Need for family participation in patient care:  Yes (Comment)  Care giving concerns:  Patient and patients son [name] voice no concerns at this time.    Social Worker assessment / plan:  Social work Theatre manager met with patient via bedside to discuss current living situation and social supports. Social work Theatre manager attempted to address current living situations, but patient was currently confused. Patient gave permission to speak with son, [name] [phone]) regarding living situation and discharge planning. Social work Theatre manager spoke with son who confirmed that patient has lived at [place] independent living since May of 2018 and plans to return to that facility upon discharge. Son states he visits patient frequently to assist her with various needs. No concerns from patient or son at this time.   Employment status:  Retired Forensic scientist:  Medicare PT Recommendations:    Information / Referral to community resources:     Patient/Family's Response to care:  Patient and patients son, [name] were accepting and appreciative of care given by social work Theatre manager.  Patient/Family's Understanding of  and Emotional Response to Diagnosis, Current Treatment, and Prognosis:  Unknown  Emotional Assessment Appearance:  Appears stated age Attitude/Demeanor/Rapport:    Affect (typically observed):  Accepting, Calm, Appropriate Orientation:  Oriented to Self Alcohol / Substance use:    Psych involvement (Current and /or in the community):     Discharge Needs  Concerns to be addressed:  No discharge needs identified Readmission within the last 30 days:  No Current discharge risk:  None Barriers to Discharge:  No Barriers Identified   Willeen Niece, Student-Social Work 11/23/2016, 2:30 PM

## 2016-11-23 NOTE — ED Notes (Signed)
Writer made an unsuccessful attempt to draw labs. 

## 2016-11-23 NOTE — ED Notes (Signed)
Attempted to draw labs unsuccessfully

## 2016-11-23 NOTE — ED Notes (Signed)
Ultrasound at bedside

## 2016-11-23 NOTE — ED Notes (Signed)
Patient hard stick and has been stuck multiple times for blood work. Unable to obtain second set of cultures.

## 2016-11-23 NOTE — Progress Notes (Signed)
ANTICOAGULATION CONSULT NOTE - Initial Consult  Pharmacy Consult for Enoxaparin, Warfarin Indication: history of PEs  Allergies  Allergen Reactions  . Crestor [Rosuvastatin Calcium] Other (See Comments)    Feeling very bad, aching all over.  . Erythromycin Hives  . Gabapentin Other (See Comments)    Blurred vision  . Pregabalin Other (See Comments)    Crossed vision.  . Carbidopa Other (See Comments)    Headaches, nausea, dizziness  . Macrobid [Nitrofurantoin] Hives  . Losartan Itching    Patient Measurements: Height: 5\' 5"  (165.1 cm) Weight: 200 lb (90.7 kg) IBW/kg (Calculated) : 57   Vital Signs: Temp: 101.1 F (38.4 C) (09/20 0745) Temp Source: Rectal (09/20 0745) BP: 136/67 (09/20 0835) Pulse Rate: 74 (09/20 0835)  Labs:  Recent Labs  11/23/16 0221 11/23/16 0447  HGB 13.7  --   HCT 40.9  --   PLT 230  --   LABPROT 18.8*  --   INR 1.59  --   CREATININE 0.85  --   CKTOTAL  --  300*    Estimated Creatinine Clearance: 65.6 mL/min (by C-G formula based on SCr of 0.85 mg/dL).   Medical History: Past Medical History:  Diagnosis Date  . Arthritis   . Chronic insomnia   . Complication of anesthesia    severe itching night of surgery requiring meds- also couldnt swallow- throat "paralyzed""  . Fibromyalgia   . Glaucoma   . Hypercholesterolemia   . Hyperlipidemia   . Hypertension    PCP Dr Cari Caraway- [place]  05/15/11 with clearance and note on chart,    chest x ray, EKG 12/12 EPIC, eccho 7/11 EPIC  . Hypothyroidism    s/p graves disease  . Kidney stone   . PD (Parkinson's disease) ([place])   . Peripheral vascular disease ([place])    PULMONARY EMBOLUS x 2- 2011, 2012/ FOLLOWED BY DR ODOGWU-LOV 12/12 EPIC   PT STAES WILL STOP COUMADIN 3/12 and begin LOVONOX 05/17/11 as previously instructed  . Sinus infection    at present- dr Honor Loh PA aware- was seen there and at PCP 05/10/11  . Thyroid disease    Hypothyroidism     Assessment: 57 y/oF on warfarin prior to  admission for hx of PEs admitted on 11/23/16 with sepsis secondary to UTI. INR subtherapeutic on admission, 1.59. Pharmacy consulted to manage warfarin therapy while patient admitted. Patient also started on enoxaparin due to subtherapeutic INR. CT of head this AM with no evidence of intracranial injury. H/H, Pltc WNL. PTA warfarin dose reported as 4mg  PO daily-last dose unknown at this time. Note patient also on Celecoxib, which can increase risk of bleeding.   Goal of Therapy:  INR 2-3 Monitor platelets by anticoagulation protocol: Yes   Plan:  Lovenox 1mg /kg (90mg ) SQ q12h until INR therapeutic Warfarin 6mg  PO x 1 today Daily PT/INR Monitor closely for s/sx of bleeding Consider holding Celecoxib, if clinically appropriate    Lindell Spar, PharmD, BCPS Pager: [phone] 11/23/2016 11:21 AM

## 2016-11-23 NOTE — ED Notes (Signed)
Pt did not ambulate well in hall. Used walker. Pt had to stop several times to hold on to the wall. Right leg seems to bother her. Pt cannot place right heel down when walking.

## 2016-11-23 NOTE — H&P (Signed)
History and Physical    [name] [name] TOI:[id number] DOB: [birth year] DOA: 11/23/2016   PCP: Darreld Mclean, MD   Attending physician: Allyson Sabal  Patient coming from/Resides with: Independent living/[facility]  Chief Complaint: Fall and altered mental status  HPI: [name] [name] is a 71 y.o. female with medical history significant for Parkinson's disease with fibromyalgia resident of independent living, hypertension with associated left ventricular diastolic dysfunction and mild pulmonary hypertension, dyslipidemia, chronic insomnia, peripheral vascular disease, history of renal calculi, and recurrent UTI ESBL positive with similar bacteremia admissions. According to the chart and family patient has been having gait disturbance worse than baseline with falls and progressive altered mentation for several days which is typical of her presentation with UTI. She fell last night around 11 PM and utilized her Life Alert to summon help. EMS was called and she was transported to the hospital. Upon arrival she was afebrile and hemodynamically stable. She was found to have a significant leukocytosis, elevated CK and appeared clinically dehydrated. While holding in the ER around 7 AM she was found have a rectal temperature 101.1. Subsequent urinalysis was consistent with UTI. Patient has been given an empiric dose of Rocephin IV.  ED Course:  Vital Signs: BP 136/67   Pulse 74   Temp (!) 101.1 F (38.4 C) (Rectal)   Resp 15   Ht 5\' 5"  (1.651 m)   Wt 90.7 kg (200 lb)   SpO2 100%   BMI 33.28 kg/m  2 view CXR: Neg CT head without contrast: No acute process Lab data: Sodium 135, potassium 4.0, chloride 98, CO2 26, glucose 135, BUN 20, crit and 2.85, anion gap 11, CK 300, white count 24,000 with neutrophils 85%, absolute neutrophils 20.2%, hemoglobin 13.7, platelets 230,000, PT 18.8, INR 1.59, urinalysis abnormal with cloudy appearance, amber color, large hemoglobin, 5 ketones, large leukocytes, 100  protein, nitrite negative, many bacteria, 0-5 squamous epithelials, WBC too numerous to count in the bases clumps present. Urine culture pending collection. Medications and treatments: Rocephin 1 g IV 1  Review of Systems:  In addition to the HPI above,  No Fever-chills, myalgias or other constitutional symptoms No Headache, changes with Vision or hearing, new weakness, tingling, numbness in any extremity, dizziness, dysarthria or word finding difficulty, gait disturbance or imbalance, tremors or seizure activity No problems swallowing food or Liquids, indigestion/reflux, choking or coughing while eating, abdominal pain with or after eating No Chest pain, Cough or Shortness of Breath, palpitations, orthopnea or DOE No Abdominal pain, N/V, melena,hematochezia, dark tarry stools, constipation No dysuria, malodorous urine, hematuria or flank pain No new skin rashes, lesions, masses or bruises, No new joint pains, aches, swelling or redness No recent unintentional weight gain or loss No polyuria, polydypsia or polyphagia   Past Medical History:  Diagnosis Date  . Arthritis   . Chronic insomnia   . Complication of anesthesia    severe itching night of surgery requiring meds- also couldnt swallow- throat "paralyzed""  . Fibromyalgia   . Glaucoma   . Hypercholesterolemia   . Hyperlipidemia   . Hypertension    PCP Dr Cari Caraway- [place]  05/15/11 with clearance and note on chart,    chest x ray, EKG 12/12 [facility], eccho 7/11 [facility]  . Hypothyroidism    s/p graves disease  . Kidney stone   . PD (Parkinson's disease) ([place])   . Peripheral vascular disease ([place])    PULMONARY EMBOLUS x 2- 2011, 2012/ FOLLOWED BY DR [id number]-LOV 12/12 [facility]   PT STAES  WILL STOP COUMADIN 3/12 and begin LOVONOX 05/17/11 as previously instructed  . Sinus infection    at present- dr Honor Loh PA aware- was seen there and at PCP 05/10/11  . Thyroid disease    Hypothyroidism    Past Surgical History:  Procedure Laterality Date   . ABDOMINAL HYSTERECTOMY    . APPENDECTOMY    . CATARACT EXTRACTION Bilateral   . CHOLECYSTECTOMY    . COLONOSCOPY    . EXPLORATORY LAPAROTOMY    . JOINT REPLACEMENT     left knee  7/12  . TOTAL KNEE ARTHROPLASTY  05/22/2011   Procedure: TOTAL KNEE ARTHROPLASTY;  Surgeon: Mauri Pole, MD;  Location: [facility];  Service: Orthopedics;  Laterality: Right;    Social History   Social History  . Marital status: Widowed    Spouse name: N/A  . Number of children: 2  . Years of education: N/A   Occupational History  . Not on file.   Social History Main Topics  . Smoking status: Former Smoker    Packs/day: 1.00    Quit date: 05/10/1989  . Smokeless tobacco: Never Used  . Alcohol use 0.0 oz/week     Comment: one drink once a month  . Drug use: No  . Sexual activity: Not on file   Other Topics Concern  . Not on file   Social History Narrative   Lives at home alone   Drinks caffeine occasionally     Mobility: Rolling walker Work history: Disabled   Allergies  Allergen Reactions  . Crestor [Rosuvastatin Calcium] Other (See Comments)    Feeling very bad, aching all over.  . Erythromycin Hives  . Gabapentin Other (See Comments)    Blurred vision  . Pregabalin Other (See Comments)    Crossed vision.  . Carbidopa Other (See Comments)    Headaches, nausea, dizziness  . Macrobid [Nitrofurantoin] Hives  . Losartan Itching    Family History  Problem Relation Age of Onset  . Prostate cancer Father   . Stroke Father      Prior to Admission medications   Medication Sig Start Date End Date Taking? Authorizing Provider  albuterol (PROVENTIL HFA;VENTOLIN HFA) 108 (90 Base) MCG/ACT inhaler Inhale 2 puffs into the lungs every 6 (six) hours as needed for wheezing or shortness of breath. 04/07/16  Yes Hoyt Koch, MD  atorvastatin (LIPITOR) 10 MG tablet Take 1 tablet (10 mg total) by mouth daily. 08/16/16  Yes [name], [name], MD  Carbidopa-Levodopa ER (RYTARY)  23.75-95 MG CPCR Take 1 tablet by mouth 3 (three) times daily. 09/07/16  Yes Tat, Eustace Quail, DO  celecoxib (CELEBREX) 100 MG capsule Take 1 capsule (100 mg total) by mouth 2 (two) times daily. 07/09/16  Yes [name], [name], MD  cetirizine (ZYRTEC) 10 MG tablet Take 1 tablet (10 mg total) by mouth daily. 04/17/16  Yes Shelda Pal, DO  DULoxetine (CYMBALTA) 60 MG capsule Take 1 capsule (60 mg total) by mouth daily. 08/31/16  Yes [name], [name], MD  HYDROcodone-acetaminophen (NORCO/VICODIN) 5-325 MG tablet Take 1 tablet by mouth every 6 (six) hours as needed for moderate pain or severe pain. 09/17/16  Yes Phelps, Erin O, PA-C  magnesium oxide (MAG-OX) 400 (241.3 Mg) MG tablet Take 0.5 tablets (200 mg total) by mouth daily. 12/21/15  Yes Robbie Lis, MD  ondansetron (ZOFRAN ODT) 4 MG disintegrating tablet Take 1 tablet (4 mg total) by mouth every 8 (eight) hours as needed for nausea or vomiting.  06/27/16  Yes Saguier, Percell Miller, PA-C  Oxycodone HCl 10 MG TABS Take 1/2 tablet daily as needed for neck pain. If pain persists may take another 1/2 tablet per day Patient taking differently: Take 5 mg by mouth See admin instructions. Take 5 mg daily as needed for neck pain. If pain persists may take another 5 mg per day 05/03/16  Yes Ghimire, Henreitta Leber, MD  SYNTHROID 150 MCG tablet Take 1 tablet (150 mcg total) by mouth daily before breakfast. 07/09/16  Yes [name], [name], MD  telmisartan-hydrochlorothiazide (MICARDIS HCT) 40-12.5 MG tablet Take 1 tablet by mouth daily. 07/09/16  Yes [name], [name], MD  warfarin (COUMADIN) 4 MG tablet Take 1 tablet (4 mg total) by mouth See admin instructions. Adjust as directed by MD 10/09/16  Yes [name], [name], MD  zolpidem (AMBIEN) 5 MG tablet TAKE 1 TABLET BY MOUTH ONCE DAILY AT BEDTIME AS NEEDED FOR SLEEP 11/07/16  Yes [name], [name], MD  acetaminophen (TYLENOL) 500 MG tablet Take 500 mg by mouth every 6 (six) hours as needed for mild pain.    [provider]  Cholecalciferol (VITAMIN D3) 5000 UNITS CAPS Take 5,000 Units by mouth daily.     [provider]  cyanocobalamin 500 MCG tablet Take 500 mcg by mouth daily.    [provider]  fluticasone (FLONASE) 50 MCG/ACT nasal spray Place 1 spray into both nostrils daily.    [provider]  methocarbamol (ROBAXIN) 500 MG tablet Take 500 mg by mouth every 6 (six) hours as needed for muscle spasms.    [provider]  omega-3 acid ethyl esters (LOVAZA) 1 g capsule Take 1 g by mouth daily.     [provider]    Physical Exam: Vitals:   11/23/16 4097 11/23/16 0605 11/23/16 0745 11/23/16 0835  BP: 138/78   136/67  Pulse: 96   74  Resp: 16   15  Temp: 97.8 F (36.6 C)  (!) 101.1 F (38.4 C)   TempSrc: Oral  Rectal   SpO2: 95%   100%  Weight:  90.7 kg (200 lb)    Height:  5\' 5"  (1.651 m)        Constitutional: NADBut is lethargic Eyes: PERRL, lids and conjunctivae normal ENMT: Mucous membranes are dry. Posterior pharynx clear of any exudate or lesions. Normal dentition.  Neck: normal, supple, no masses, no thyromegaly Respiratory: clear to auscultation bilaterally, no wheezing, no crackles. Normal respiratory effort. No accessory muscle use.  Cardiovascular: Regular rate and rhythm, no murmurs / rubs / gallops. No extremity edema. 2+ pedal pulses. No carotid bruits.  Abdomen: no tenderness, no masses palpated. No hepatosplenomegaly. Bowel sounds positive.  Musculoskeletal: no clubbing / cyanosis. No joint deformity upper and lower extremities. Patient has well-healed scar over right anterior knee consistent with prior knee replacement surgery, no contractures. Normal muscle tone.  Skin: no rashes, lesions, ulcers. No induration Neurologic: CN 2-12 grossly intact. Sensation intact, DTR normal. Strength 5/5 x all 4 extremities.  Psychiatric: Lethargic but awakens to voice and tactile stimulation. Oriented 3   Labs on Admission: I have  personally reviewed following labs and imaging studies  CBC:  Recent Labs Lab 11/23/16 0221  WBC 24.0*  NEUTROABS 20.2*  HGB 13.7  HCT 40.9  MCV 84.3  PLT 353   Basic Metabolic Panel:  Recent Labs Lab 11/23/16 0221  NA 135  K 4.0  CL 98*  CO2 26  GLUCOSE 135*  BUN 20  CREATININE 0.85  CALCIUM 9.3   GFR: Estimated Creatinine Clearance: 65.6 mL/min (by C-G formula based on SCr of 0.85 mg/dL). Liver Function Tests: No results for input(s): AST, ALT, ALKPHOS, BILITOT, PROT, ALBUMIN in the last 168 hours. No results for input(s): LIPASE, AMYLASE in the last 168 hours. No results for input(s): AMMONIA in the last 168 hours. Coagulation Profile:  Recent Labs Lab 11/23/16 0221  INR 1.59   Cardiac Enzymes:  Recent Labs Lab 11/23/16 0447  CKTOTAL 300*   BNP (last 3 results) No results for input(s): PROBNP in the last 8760 hours. HbA1C: No results for input(s): HGBA1C in the last 72 hours. CBG: No results for input(s): GLUCAP in the last 168 hours. Lipid Profile: No results for input(s): CHOL, HDL, LDLCALC, TRIG, CHOLHDL, LDLDIRECT in the last 72 hours. Thyroid Function Tests: No results for input(s): TSH, T4TOTAL, FREET4, T3FREE, THYROIDAB in the last 72 hours. Anemia Panel: No results for input(s): VITAMINB12, FOLATE, FERRITIN, TIBC, IRON, RETICCTPCT in the last 72 hours. Urine analysis:    Component Value Date/Time   COLORURINE AMBER (A) 11/23/2016 0745   APPEARANCEUR CLOUDY (A) 11/23/2016 0745   LABSPEC 1.011 11/23/2016 0745   PHURINE 6.0 11/23/2016 0745   GLUCOSEU NEGATIVE 11/23/2016 0745   HGBUR LARGE (A) 11/23/2016 0745   BILIRUBINUR NEGATIVE 11/23/2016 0745   BILIRUBINUR negative 06/27/2016 1222   KETONESUR 5 (A) 11/23/2016 0745   PROTEINUR 100 (A) 11/23/2016 0745   UROBILINOGEN negative (A) 06/27/2016 1222   UROBILINOGEN 0.2 09/07/2012 1049   NITRITE NEGATIVE 11/23/2016 0745   LEUKOCYTESUR LARGE (A) 11/23/2016 0745   Sepsis  Labs: @LABRCNTIP (procalcitonin:4,lacticidven:4) )No results found for this or any previous visit (from the past 240 hour(s)).   Radiological Exams on Admission: Dg Chest 2 View  Result Date: 11/23/2016 CLINICAL DATA:  Patient fell at 2300 hours. Multiple falls over the past few weeks. History of Parkinson's and fibromyalgia. Generalize pain. Lethargy. EXAM: CHEST  2 VIEW COMPARISON:  None. FINDINGS: Shallow inspiration. Normal heart size and pulmonary vascularity. No focal airspace disease or consolidation in the lungs. No blunting of costophrenic angles. No pneumothorax. Mediastinal contours appear intact. Degenerative changes in the spine. Calcification of the aorta. Surgical clips in the right upper quadrant. IMPRESSION: No active cardiopulmonary disease. Electronically Signed   By: Lucienne Capers M.D.   On: 11/23/2016 06:57   Ct Head Wo Contrast  Result Date: 11/23/2016 CLINICAL DATA:  Head trauma. " "Does not feel right" . Initial encounter. EXAM: CT HEAD WITHOUT CONTRAST TECHNIQUE: Contiguous axial images were obtained from the base of the skull through the vertex without intravenous contrast. COMPARISON:  09/17/2016 FINDINGS: Brain: No evidence of acute infarction, hemorrhage, hydrocephalus, extra-axial collection or mass lesion/mass effect. Chronic ventriculomegaly that is likely atrophy. Chronic small vessel ischemia with thin confluent band in the periventricular white matter. Cavum septum pellucidum et vergae, incidental. Vascular: Atherosclerotic calcification Skull: No acute or aggressive finding. Sinuses/Orbits: No evidence of injury. Bilateral cataract resection. IMPRESSION: 1. No evidence of intracranial injury.  Stable compared to prior. 2. Atrophy and mild chronic small vessel ischemia. Electronically Signed   By: Monte Fantasia M.D.   On: 11/23/2016 07:08     Assessment/Plan Principal Problem:  Sepsis due to Recurrent UTI -Patient presents to the emergency department with  signs of symptoms consistent with previous UTI: Altered mental status, falls subsequently found to have significant leukocytosis with left shift, fever and abnormal urinalysis concerning for UTI -Previous cultures have revealed ESBL/multidrug resistant Enterobacter and Escherichia coli  therefore begin empiric meropenem with pharmacy managing -History of similar organisms causing bacteremia so obtain blood cultures -Lactic acid, Procalcitonin -IV fluid hydration -Has not been evaluated by urology-consider scheduling outpatient evaluation -Bladder scan re: post void residual -Renal ultrasound  Active Problems:   Fall at nursing home -Secondary to acute infection superimposed on gait disturbance related to Parkinson's disease -Mobilize with assistance -Consider PT evaluation     Elevated CK -Secondary to dehydration as well as recurrent falls -Only mild elevation and no evidence of rhabdomyolysis and renal function normal -PTK in a.m.    Metabolic encephalopathy -Secondary to UTI superimposed on Parkinson's disease -NPO except for meds until more alert    PD (Parkinson's disease)  -Continue Sinemet    Hypertension/Mild Pulmonary HTN/Left ventricular diastolic dysfunction -Blood pressure somewhat suboptimal in setting of sepsis physiology -Hold preadmission myocarditis -Last echocardiogram in 2011 and no prior history of heart failure exacerbations      Recurrent PE, hypercoagulable state -Continue warfarin with pharmacy to dose -INR subtherapeutic so bridged with Lovenox    Fibromyalgia/chronic insomnia -Patient on prn Robaxin, oxycodone and Vicodin prior to admission - Contnue preadmission Celebrex and Cymbalta  -Given altered mentation will hold Ambien     Hypercholesterolemia -Hold Lipitor in setting of elevated CK  -Last PCP visit documented patient was not taking Lovaza     Hypothyroidism, adult -Continue Synthroid  -TSH was 0.26 in August which is slightly low  -repeat this admission        DVT prophylaxis: warfarin Code Status: DO NOT RESUSCITATE Family Communication: daughter Disposition Plan: return to independent living Consults called: none    Kortnie Stovall L. ANP-BC [facility] Pager [phone]   If 7PM-7AM, please contact night-coverage [email or web address] Password [email or web address]  11/23/2016, 10:34 AM

## 2016-11-23 NOTE — ED Triage Notes (Signed)
Patient arrives by EMS from [facility] after a fall at 2300-used her life alert-unable to get up. Patient states she doesn't feel right, more falls in the past few weeks-hx Parkinson's and fibromyalgia. Patient complaining of generalized pain. CBG 151 BP 124/84 HR100 RR18 O2 sat 99%

## 2016-11-24 DIAGNOSIS — N390 Urinary tract infection, site not specified: Secondary | ICD-10-CM

## 2016-11-24 LAB — CBC
HCT: 39.9 % (ref 36.0–46.0)
Hemoglobin: 13.7 g/dL (ref 12.0–15.0)
MCH: 28.5 pg (ref 26.0–34.0)
MCHC: 34.3 g/dL (ref 30.0–36.0)
MCV: 83.1 fL (ref 78.0–100.0)
PLATELETS: 214 10*3/uL (ref 150–400)
RBC: 4.8 MIL/uL (ref 3.87–5.11)
RDW: 13.4 % (ref 11.5–15.5)
WBC: 13.5 10*3/uL — AB (ref 4.0–10.5)

## 2016-11-24 LAB — BLOOD CULTURE ID PANEL (REFLEXED)
Acinetobacter baumannii: NOT DETECTED
CARBAPENEM RESISTANCE: NOT DETECTED
Candida albicans: NOT DETECTED
Candida glabrata: NOT DETECTED
Candida krusei: NOT DETECTED
Candida parapsilosis: NOT DETECTED
Candida tropicalis: NOT DETECTED
ENTEROCOCCUS SPECIES: NOT DETECTED
Enterobacter cloacae complex: NOT DETECTED
Enterobacteriaceae species: DETECTED — AB
Escherichia coli: DETECTED — AB
HAEMOPHILUS INFLUENZAE: NOT DETECTED
Klebsiella oxytoca: NOT DETECTED
Klebsiella pneumoniae: NOT DETECTED
LISTERIA MONOCYTOGENES: NOT DETECTED
NEISSERIA MENINGITIDIS: NOT DETECTED
Proteus species: NOT DETECTED
Pseudomonas aeruginosa: NOT DETECTED
SERRATIA MARCESCENS: NOT DETECTED
STAPHYLOCOCCUS AUREUS BCID: NOT DETECTED
STAPHYLOCOCCUS SPECIES: NOT DETECTED
STREPTOCOCCUS AGALACTIAE: NOT DETECTED
STREPTOCOCCUS PNEUMONIAE: NOT DETECTED
Streptococcus pyogenes: NOT DETECTED
Streptococcus species: NOT DETECTED

## 2016-11-24 LAB — COMPREHENSIVE METABOLIC PANEL
ALT: 8 U/L — AB (ref 14–54)
AST: 20 U/L (ref 15–41)
Albumin: 2.8 g/dL — ABNORMAL LOW (ref 3.5–5.0)
Alkaline Phosphatase: 145 U/L — ABNORMAL HIGH (ref 38–126)
Anion gap: 11 (ref 5–15)
BUN: 22 mg/dL — ABNORMAL HIGH (ref 6–20)
CALCIUM: 8.6 mg/dL — AB (ref 8.9–10.3)
CHLORIDE: 103 mmol/L (ref 101–111)
CO2: 23 mmol/L (ref 22–32)
CREATININE: 0.76 mg/dL (ref 0.44–1.00)
Glucose, Bld: 102 mg/dL — ABNORMAL HIGH (ref 65–99)
Potassium: 3.6 mmol/L (ref 3.5–5.1)
Sodium: 137 mmol/L (ref 135–145)
Total Bilirubin: 0.7 mg/dL (ref 0.3–1.2)
Total Protein: 6.4 g/dL — ABNORMAL LOW (ref 6.5–8.1)

## 2016-11-24 LAB — TSH: TSH: 0.505 u[IU]/mL (ref 0.350–4.500)

## 2016-11-24 LAB — T4, FREE: Free T4: 1.23 ng/dL — ABNORMAL HIGH (ref 0.61–1.12)

## 2016-11-24 LAB — PROTIME-INR
INR: 1.94
Prothrombin Time: 22 seconds — ABNORMAL HIGH (ref 11.4–15.2)

## 2016-11-24 MED ORDER — WARFARIN SODIUM 4 MG PO TABS
4.0000 mg | ORAL_TABLET | Freq: Once | ORAL | Status: AC
Start: 2016-11-24 — End: 2016-11-24
  Administered 2016-11-24: 4 mg via ORAL
  Filled 2016-11-24: qty 1

## 2016-11-24 NOTE — Progress Notes (Signed)
[facility] for Enoxaparin, Warfarin Indication: history of PEs  Allergies  Allergen Reactions  . Crestor [Rosuvastatin Calcium] Other (See Comments)    Feeling very bad, aching all over.  . Erythromycin Hives  . Gabapentin Other (See Comments)    Blurred vision  . Pregabalin Other (See Comments)    Crossed vision.  . Carbidopa Other (See Comments)    Headaches, nausea, dizziness  . Macrobid [Nitrofurantoin] Hives  . Losartan Itching    Patient Measurements: Height: 5\' 7"  (170.2 cm) Weight: 199 lb 15.3 oz (90.7 kg) IBW/kg (Calculated) : 61.6   Vital Signs: Temp: 98 F (36.7 C) (09/21 1444) Temp Source: Oral (09/21 1444) BP: 129/60 (09/21 1444) Pulse Rate: 85 (09/21 1444)  Labs:  Recent Labs  11/23/16 0221 11/23/16 0447 11/23/16 1701 11/24/16 0551  HGB 13.7  --   --  13.7  HCT 40.9  --   --  39.9  PLT 230  --   --  214  LABPROT 18.8*  --   --  22.0*  INR 1.59  --   --  1.94  CREATININE 0.85  --   --  0.76  CKTOTAL  --  300* 200  --     Estimated Creatinine Clearance: 72.4 mL/min (by C-G formula based on SCr of 0.76 mg/dL).   Assessment: 63 y/oF on warfarin prior to admission for hx of PEs admitted on 11/23/16 with sepsis secondary to UTI. Pharmacy to continue warfarin and start Lovenox for subtherapeutic INR   Baseline INR: subtherapeutic  Prior to admission anticoagulation: warfarin 4 mg daily, LD unk  Today, 11/24/2016:  CBC: stable WNL  SCr: stable WNL  INR improved to just below goal range  Drug-Drug interactions: none major, celecoxib may increase bleed risk  No bleeding issues documented  Diet ordered (CLD)   Goal of Therapy:  INR 2-3 Monitor platelets by anticoagulation protocol: Yes   Plan:   Continue  Lovenox 1mg /kg (90mg ) SQ q12h until INR therapeutic  Warfarin 4 mg PO x 1 today  Daily INR  Monitor closely for s/sx of bleeding  Consider holding Celecoxib if clinically  appropriate  Reuel Boom, PharmD, BCPS Pager: [phone] 11/24/2016, 4:09 PM

## 2016-11-24 NOTE — Progress Notes (Signed)
PROGRESS NOTE  [name] [name] OVZ:[id number] DOB: [birth year] DOA: 11/23/2016 PCP: Darreld Mclean, MD   LOS: 1 day   Brief Narrative / Interim history: 71 y.o.femalewith medical history significant for Parkinson's disease with fibromyalgia resident of independent living, hypertension with associated left ventricular diastolic dysfunction and mild pulmonary hypertension, dyslipidemia, chronic insomnia, peripheral vascular disease, history of renal calculi, and recurrent UTI ESBL, admitted on 9/20 with sepsis due to urinary tract infection  Assessment & Plan: Principal Problem:   Recurrent UTI Active Problems:   Sepsis ([place])   Fall at nursing home   PD (Parkinson's disease) ([place])   Sepsis due to urinary tract infection ([place])   Fibromyalgia   Hypercholesterolemia   Hypertension   Hypothyroidism, adult   Elevated CK   Pulmonary HTN ([place])   Left ventricular diastolic dysfunction   Metabolic encephalopathy   History of pulmonary embolus (PE) 2/2 hypercoagulable state    Sepsis due to UTI -Patient met sepsis criteria on admission with fever, leukocytosis, tachycardia and a source -Started on meropenem, continue -Urine cultures are pending, blood cultures reflex ID panel with E. coli detected, awaiting final cultures  Acute encephalopathy -Metabolic, due to UTI, improved this morning  Parkinson's disease -Continue Sinemet  Hypertension/pulmonary hypertension/LV diastolic dysfunction -Hold Micardis for today, resume tomorrow  Hypothyroidism -Continue Synthroid, TSH normal at 0.5  Recurrent falls -PT consult  History of PE/hypercoagulable state -Continue Coumadin   DVT prophylaxis: Coumadin Code Status: DNR Family Communication: no family at bedside Disposition Plan: ILF vs SNF when ready. PT evaluation pending   Consultants:   None  Procedures:   None   Antimicrobials:  Meropenem 9/20 >>   Subjective: - no chest pain, shortness of breath, no abdominal  pain, nausea or vomiting.  Feeling a little bit better however still with generalized weakness  Objective: Vitals:   11/23/16 1528 11/23/16 1549 11/23/16 2151 11/24/16 0500  BP: 140/64 (!) 154/70 124/60 (!) 156/69  Pulse: (!) 102 94 83 88  Resp: '18 18 18 18  ' Temp:  98.1 F (36.7 C) 97.8 F (36.6 C) 99.1 F (37.3 C)  TempSrc:  Oral Oral Oral  SpO2: 100% 97% 95% 94%  Weight:  90.7 kg (199 lb 15.3 oz)    Height:  '5\' 7"'  (1.702 m)      Intake/Output Summary (Last 24 hours) at 11/24/16 1443 Last data filed at 11/24/16 0900  Gross per 24 hour  Intake             2535 ml  Output              500 ml  Net             2035 ml   Filed Weights   11/23/16 0605 11/23/16 1549  Weight: 90.7 kg (200 lb) 90.7 kg (199 lb 15.3 oz)    Examination:  Constitutional: NAD Eyes: lids and conjunctivae normal Respiratory: clear to auscultation bilaterally, no wheezing, no crackles. Normal respiratory effort. No accessory muscle use.  Cardiovascular: Regular rate and rhythm, no murmurs / rubs / gallops. No LE edema.  Abdomen: Slight tenderness in the lower part of the abdomen. Bowel sounds positive.  Musculoskeletal: no clubbing / cyanosis. No joint deformity upper and lower extremities. No contractures. Normal muscle tone.  Skin: no rashes, lesions, ulcers. No induration Neurologic: CN 2-12 grossly intact. Strength 5/5 in all 4.   Data Reviewed: I have independently reviewed following labs and imaging studies   CBC:  Recent Labs Lab 11/23/16 0221  11/24/16 0551  WBC 24.0* 13.5*  NEUTROABS 20.2*  --   HGB 13.7 13.7  HCT 40.9 39.9  MCV 84.3 83.1  PLT 230 322   Basic Metabolic Panel:  Recent Labs Lab 11/23/16 0221 11/23/16 1701 11/24/16 0551  NA 135  --  137  K 4.0  --  3.6  CL 98*  --  103  CO2 26  --  23  GLUCOSE 135*  --  102*  BUN 20  --  22*  CREATININE 0.85  --  0.76  CALCIUM 9.3  --  8.6*  MG  --  2.0  --    GFR: Estimated Creatinine Clearance: 72.4 mL/min (by C-G  formula based on SCr of 0.76 mg/dL). Liver Function Tests:  Recent Labs Lab 11/24/16 0551  AST 20  ALT 8*  ALKPHOS 145*  BILITOT 0.7  PROT 6.4*  ALBUMIN 2.8*   No results for input(s): LIPASE, AMYLASE in the last 168 hours. No results for input(s): AMMONIA in the last 168 hours. Coagulation Profile:  Recent Labs Lab 11/23/16 0221 11/24/16 0551  INR 1.59 1.94   Cardiac Enzymes:  Recent Labs Lab 11/23/16 0447 11/23/16 1701  CKTOTAL 300* 200   BNP (last 3 results) No results for input(s): PROBNP in the last 8760 hours. HbA1C: No results for input(s): HGBA1C in the last 72 hours. CBG: No results for input(s): GLUCAP in the last 168 hours. Lipid Profile: No results for input(s): CHOL, HDL, LDLCALC, TRIG, CHOLHDL, LDLDIRECT in the last 72 hours. Thyroid Function Tests:  Recent Labs  11/23/16 2328 11/24/16 0551  TSH  --  0.505  FREET4 1.23*  --    Anemia Panel: No results for input(s): VITAMINB12, FOLATE, FERRITIN, TIBC, IRON, RETICCTPCT in the last 72 hours. Urine analysis:    Component Value Date/Time   COLORURINE AMBER (A) 11/23/2016 0745   APPEARANCEUR CLOUDY (A) 11/23/2016 0745   LABSPEC 1.011 11/23/2016 0745   PHURINE 6.0 11/23/2016 0745   GLUCOSEU NEGATIVE 11/23/2016 0745   HGBUR LARGE (A) 11/23/2016 0745   BILIRUBINUR NEGATIVE 11/23/2016 0745   BILIRUBINUR negative 06/27/2016 1222   KETONESUR 5 (A) 11/23/2016 0745   PROTEINUR 100 (A) 11/23/2016 0745   UROBILINOGEN negative (A) 06/27/2016 1222   UROBILINOGEN 0.2 09/07/2012 1049   NITRITE NEGATIVE 11/23/2016 0745   LEUKOCYTESUR LARGE (A) 11/23/2016 0745   Sepsis Labs: Invalid input(s): PROCALCITONIN, LACTICIDVEN  Recent Results (from the past 240 hour(s))  Culture, blood (Routine X 2) w Reflex to ID Panel     Status: None (Preliminary result)   Collection Time: 11/23/16 11:44 AM  Result Value Ref Range Status   Specimen Description BLOOD RIGHT ANTECUBITAL  Final   Special Requests   Final     BOTTLES DRAWN AEROBIC AND ANAEROBIC Blood Culture adequate volume   Culture  Setup Time   Final    GRAM NEGATIVE RODS AEROBIC BOTTLE ONLY Organism ID to follow CRITICAL RESULT CALLED TO, READ BACK BY AND VERIFIED WITH: J GADHIA 11/24/16 @ 0931 M VESTAL Performed at [facility], [address] [place], [place] [address]    Culture GRAM NEGATIVE RODS  Final   Report Status PENDING  Incomplete  Blood Culture ID Panel (Reflexed)     Status: Abnormal   Collection Time: 11/23/16 11:44 AM  Result Value Ref Range Status   Enterococcus species NOT DETECTED NOT DETECTED Final   Listeria monocytogenes NOT DETECTED NOT DETECTED Final   Staphylococcus species NOT DETECTED NOT DETECTED Final  Staphylococcus aureus NOT DETECTED NOT DETECTED Final   Streptococcus species NOT DETECTED NOT DETECTED Final   Streptococcus agalactiae NOT DETECTED NOT DETECTED Final   Streptococcus pneumoniae NOT DETECTED NOT DETECTED Final   Streptococcus pyogenes NOT DETECTED NOT DETECTED Final   Acinetobacter baumannii NOT DETECTED NOT DETECTED Final   Enterobacteriaceae species DETECTED (A) NOT DETECTED Final    Comment: Enterobacteriaceae represent a large family of gram-negative bacteria, not a single organism. CRITICAL RESULT CALLED TO, READ BACK BY AND VERIFIED WITH: J GADHIA 11/24/16 @ 6962 M VESTAL    Enterobacter cloacae complex NOT DETECTED NOT DETECTED Final   Escherichia coli DETECTED (A) NOT DETECTED Final    Comment: CRITICAL RESULT CALLED TO, READ BACK BY AND VERIFIED WITH: J GADHIA 11/24/16 @ 0931 M VESTAL    Klebsiella oxytoca NOT DETECTED NOT DETECTED Final   Klebsiella pneumoniae NOT DETECTED NOT DETECTED Final   Proteus species NOT DETECTED NOT DETECTED Final   Serratia marcescens NOT DETECTED NOT DETECTED Final   Carbapenem resistance NOT DETECTED NOT DETECTED Final   Haemophilus influenzae NOT DETECTED NOT DETECTED Final   Neisseria meningitidis NOT DETECTED NOT DETECTED Final    Pseudomonas aeruginosa NOT DETECTED NOT DETECTED Final   Candida albicans NOT DETECTED NOT DETECTED Final   Candida glabrata NOT DETECTED NOT DETECTED Final   Candida krusei NOT DETECTED NOT DETECTED Final   Candida parapsilosis NOT DETECTED NOT DETECTED Final   Candida tropicalis NOT DETECTED NOT DETECTED Final    Comment: Performed at [facility], [place] [address], [place], [place] [address]  Culture, blood (Routine X 2) w Reflex to ID Panel     Status: None (Preliminary result)   Collection Time: 11/23/16  5:01 PM  Result Value Ref Range Status   Specimen Description   Final    BLOOD LEFT HAND Performed at [facility], [place] [address] [place], [place] [address]    Special Requests IN PEDIATRIC BOTTLE Blood Culture adequate volume  Final   Culture PENDING  Incomplete   Report Status PENDING  Incomplete      Radiology Studies: Dg Chest 2 View  Result Date: 11/23/2016 CLINICAL DATA:  Patient fell at 2300 hours. Multiple falls over the past few weeks. History of Parkinson's and fibromyalgia. Generalize pain. Lethargy. EXAM: CHEST  2 VIEW COMPARISON:  None. FINDINGS: Shallow inspiration. Normal heart size and pulmonary vascularity. No focal airspace disease or consolidation in the lungs. No blunting of costophrenic angles. No pneumothorax. Mediastinal contours appear intact. Degenerative changes in the spine. Calcification of the aorta. Surgical clips in the right upper quadrant. IMPRESSION: No active cardiopulmonary disease. Electronically Signed   By: Lucienne Capers M.D.   On: 11/23/2016 06:57   Ct Head Wo Contrast  Result Date: 11/23/2016 CLINICAL DATA:  Head trauma. " "Does not feel right" . Initial encounter. EXAM: CT HEAD WITHOUT CONTRAST TECHNIQUE: Contiguous axial images were obtained from the base of the skull through the vertex without intravenous contrast. COMPARISON:  09/17/2016 FINDINGS: Brain: No evidence of acute infarction, hemorrhage, hydrocephalus, extra-axial  collection or mass lesion/mass effect. Chronic ventriculomegaly that is likely atrophy. Chronic small vessel ischemia with thin confluent band in the periventricular white matter. Cavum septum pellucidum et vergae, incidental. Vascular: Atherosclerotic calcification Skull: No acute or aggressive finding. Sinuses/Orbits: No evidence of injury. Bilateral cataract resection. IMPRESSION: 1. No evidence of intracranial injury.  Stable compared to prior. 2. Atrophy and mild chronic small vessel ischemia. Electronically Signed   By: Angelica Chessman  Watts M.D.   On: 11/23/2016 07:08   US Renal  Result Date: 11/23/2016 CLINICAL DATA:  Recurrent urinary tract infection, Graves disease, hypertension EXAM: RENAL / URINARY TRACT ULTRASOUND COMPLETE COMPARISON:  Pelvis film of 12/17/2015 FINDINGS: Right Kidney: Length: 13.1 cm. No hydronephrosis is seen. The echogenicity of the renal parenchyma is unremarkable. Left Kidney: Length: 14.0 cm. No hydronephrosis is noted. The renal parenchymal echogenicity is normal. Bladder: The urinary bladder is not optimally distended but no abnormality is seen. Incidental imaging of the liver shows echogenicity throughout the liver parenchyma suggesting fatty infiltration. IMPRESSION: 1. No hydronephrosis. 2. Renal parenchymal echogenicity within normal limits. 3. Somewhat echogenic liver parenchyma noted incidentally suggesting diffuse fatty infiltration. Electronically Signed   By: [name] M.D.   On: 11/23/2016 11:13     Scheduled Meds: . Carbidopa-Levodopa ER  1 tablet Oral TID  . celecoxib  100 mg Oral BID  . chlorhexidine  15 mL Mouth Rinse BID  . DULoxetine  60 mg Oral Daily  . enoxaparin (LOVENOX) injection  90 mg Subcutaneous Q12H  . levothyroxine  150 mcg Oral QAC breakfast  . mouth rinse  15 mL Mouth Rinse q12n4p  . sodium chloride flush  3 mL Intravenous Q12H  . Warfarin - Pharmacist Dosing Inpatient   Does not apply q1800   Continuous Infusions: . sodium chloride 75  mL/hr at 11/24/16 0958  . meropenem (MERREM) IV 1 g (11/24/16 1238)     Marzetta Board, MD, PhD [facility] Pager [phone]  If 7PM-7AM, please contact night-coverage [email or web address] Password [facility] 11/24/2016, 2:43 PM

## 2016-11-24 NOTE — Progress Notes (Signed)
PHARMACY - PHYSICIAN COMMUNICATION CRITICAL VALUE ALERT - BLOOD CULTURE IDENTIFICATION (BCID)  Results for orders placed or performed during the hospital encounter of 11/23/16  Blood Culture ID Panel (Reflexed) (Collected: 11/23/2016 11:44 AM)  Result Value Ref Range   Enterococcus species NOT DETECTED NOT DETECTED   Listeria monocytogenes NOT DETECTED NOT DETECTED   Staphylococcus species NOT DETECTED NOT DETECTED   Staphylococcus aureus NOT DETECTED NOT DETECTED   Streptococcus species NOT DETECTED NOT DETECTED   Streptococcus agalactiae NOT DETECTED NOT DETECTED   Streptococcus pneumoniae NOT DETECTED NOT DETECTED   Streptococcus pyogenes NOT DETECTED NOT DETECTED   Acinetobacter baumannii NOT DETECTED NOT DETECTED   Enterobacteriaceae species DETECTED (A) NOT DETECTED   Enterobacter cloacae complex NOT DETECTED NOT DETECTED   Escherichia coli DETECTED (A) NOT DETECTED   Klebsiella oxytoca NOT DETECTED NOT DETECTED   Klebsiella pneumoniae NOT DETECTED NOT DETECTED   Proteus species NOT DETECTED NOT DETECTED   Serratia marcescens NOT DETECTED NOT DETECTED   Carbapenem resistance NOT DETECTED NOT DETECTED   Haemophilus influenzae NOT DETECTED NOT DETECTED   Neisseria meningitidis NOT DETECTED NOT DETECTED   Pseudomonas aeruginosa NOT DETECTED NOT DETECTED   Candida albicans NOT DETECTED NOT DETECTED   Candida glabrata NOT DETECTED NOT DETECTED   Candida krusei NOT DETECTED NOT DETECTED   Candida parapsilosis NOT DETECTED NOT DETECTED   Candida tropicalis NOT DETECTED NOT DETECTED    Name of physician (or Provider) Contacted: [place]  Changes to prescribed antibiotics required: none, history ESBL in blood and urine, continue meropenem pending sensitivities  Pleasant Bensinger A 11/24/2016  4:07 PM

## 2016-11-24 NOTE — Care Management Note (Signed)
Case Management Note  Patient Details  Name: [name] [name] MRN: [id number] Date of Birth: 09-17-1945  Subjective/Objective:                  Sepsis   Action/Plan: November 23, 2016 Velva Harman, BSN, [place],  [place] Chart reviewed for case management and discharge needs. Next review date: [id number]  Expected Discharge Date:   (unknown)               Expected Discharge Plan:  Assisted Living / Rest Home  In-House Referral:  Clinical Social Work  Discharge planning Services  CM Consult  Post Acute Care Choice:    Choice offered to:     DME Arranged:    DME Agency:     HH Arranged:    [place] Agency:     Status of Service:  In process, will continue to follow  If discussed at Long Length of Stay Meetings, dates discussed:    Additional Comments:  Leeroy Cha, RN 11/24/2016, 9:51 AM

## 2016-11-25 DIAGNOSIS — I519 Heart disease, unspecified: Secondary | ICD-10-CM

## 2016-11-25 DIAGNOSIS — E7800 Pure hypercholesterolemia, unspecified: Secondary | ICD-10-CM

## 2016-11-25 DIAGNOSIS — Z86711 Personal history of pulmonary embolism: Secondary | ICD-10-CM

## 2016-11-25 DIAGNOSIS — W19XXXS Unspecified fall, sequela: Secondary | ICD-10-CM

## 2016-11-25 DIAGNOSIS — Y92129 Unspecified place in nursing home as the place of occurrence of the external cause: Secondary | ICD-10-CM

## 2016-11-25 LAB — BASIC METABOLIC PANEL
ANION GAP: 6 (ref 5–15)
BUN: 15 mg/dL (ref 6–20)
CHLORIDE: 109 mmol/L (ref 101–111)
CO2: 25 mmol/L (ref 22–32)
CREATININE: 0.63 mg/dL (ref 0.44–1.00)
Calcium: 8.2 mg/dL — ABNORMAL LOW (ref 8.9–10.3)
GFR calc non Af Amer: >60 mL/min (ref >60)
Glucose, Bld: 107 mg/dL — ABNORMAL HIGH (ref 65–99)
POTASSIUM: 3 mmol/L — AB (ref 3.5–5.1)
SODIUM: 140 mmol/L (ref 135–145)

## 2016-11-25 LAB — CBC
HCT: 37.1 % (ref 36.0–46.0)
HEMOGLOBIN: 12.4 g/dL (ref 12.0–15.0)
MCH: 28.2 pg (ref 26.0–34.0)
MCHC: 33.4 g/dL (ref 30.0–36.0)
MCV: 84.3 fL (ref 78.0–100.0)
PLATELETS: 207 10*3/uL (ref 150–400)
RBC: 4.4 MIL/uL (ref 3.87–5.11)
RDW: 13.6 % (ref 11.5–15.5)
WBC: 8.3 10*3/uL (ref 4.0–10.5)

## 2016-11-25 LAB — URINE CULTURE: Culture: >=100000 — AB

## 2016-11-25 LAB — PROTIME-INR
INR: 2.19
Prothrombin Time: 24.1 seconds — ABNORMAL HIGH (ref 11.4–15.2)

## 2016-11-25 LAB — PROCALCITONIN: PROCALCITONIN: 1.31 ng/mL

## 2016-11-25 MED ORDER — WARFARIN SODIUM 4 MG PO TABS
4.0000 mg | ORAL_TABLET | Freq: Once | ORAL | Status: AC
  Administered 2016-11-25: 4 mg via ORAL
  Filled 2016-11-25: qty 1

## 2016-11-25 NOTE — Evaluation (Signed)
Physical Therapy Evaluation Patient Details Name: [name] [name] MRN: [id number] DOB: [birth year] Today's Date: 11/25/2016   History of Present Illness  71 y.o. female with medical history significant for Parkinson's disease with fibromyalgia, hypertension with associated left ventricular diastolic dysfunction and mild pulmonary hypertension, dyslipidemia, chronic insomnia, peripheral vascular disease, history of renal calculi, and recurrent UTI ESBL, admitted on 9/20 with sepsis due to urinary tract infection  Clinical Impression  Pt admitted with above diagnosis. Pt currently with functional limitations due to the deficits listed below (see PT Problem List).  Pt will benefit from skilled PT to increase their independence and safety with mobility to allow discharge to the venue listed below.  Pt reports her mobility is close to baseline, and she was working with [place] prior to admission.  Pt plans to d/c back to ILF.    Follow Up Recommendations Home health PT    Equipment Recommendations  None recommended by PT    Recommendations for Other Services       Precautions / Restrictions Precautions Precautions: Fall      Mobility  Bed Mobility Overal bed mobility: Needs Assistance Bed Mobility: Supine to Sit     Supine to sit: Min guard;HOB elevated        Transfers Overall transfer level: Needs assistance Equipment used: Rolling walker (2 wheeled) Transfers: Sit to/from Stand Sit to Stand: Min guard         General transfer comment: min/guard for safety  Ambulation/Gait Ambulation/Gait assistance: Min guard Ambulation Distance (Feet): 40 Feet Assistive device: Rolling walker (2 wheeled) Gait Pattern/deviations: Step-through pattern;Decreased stride length;Shuffle;Narrow base of support     General Gait Details: short small steps, steady with RW, distance limited by fatigue  Stairs            Wheelchair Mobility    Modified Rankin (Stroke Patients Only)       Balance Overall balance assessment: History of Falls                                           Pertinent Vitals/Pain Pain Assessment: No/denies pain    Home Living       Type of Home: Independent living facility         Home Equipment: Bedside commode;Walker - 4 wheels;Walker - 2 wheels      Prior Function Level of Independence: Independent with assistive device(s)         Comments: Reports she walks to dining hall with RW and manages ADLs independently     Hand Dominance        Extremity/Trunk Assessment        Lower Extremity Assessment Lower Extremity Assessment: Generalized weakness       Communication   Communication: No difficulties  Cognition Arousal/Alertness: Awake/alert Behavior During Therapy: WFL for tasks assessed/performed Overall Cognitive Status: Within Functional Limits for tasks assessed                                        General Comments      Exercises     Assessment/Plan    PT Assessment Patient needs continued PT services  PT Problem List Decreased strength;Decreased mobility;Decreased balance;Decreased activity tolerance       PT Treatment Interventions DME instruction;Gait training;Therapeutic exercise;Therapeutic activities;Functional mobility training;Balance training;Patient/family education  PT Goals (Current goals can be found in the Care Plan section)  Acute Rehab PT Goals Patient Stated Goal: wishes to d/c back to [facility] ILF PT Goal Formulation: With patient Time For Goal Achievement: 12/02/16 Potential to Achieve Goals: Good    Frequency Min 3X/week   Barriers to discharge        Co-evaluation               AM-PAC PT "6 Clicks" Daily Activity  Outcome Measure Difficulty turning over in bed (including adjusting bedclothes, sheets and blankets)?: A Little Difficulty moving from lying on back to sitting on the side of the bed? : A Lot Difficulty sitting  down on and standing up from a chair with arms (e.g., wheelchair, bedside commode, etc,.)?: Unable Help needed moving to and from a bed to chair (including a wheelchair)?: A Little Help needed walking in hospital room?: A Little Help needed climbing 3-5 steps with a railing? : A Lot 6 Click Score: 14    End of Session Equipment Utilized During Treatment: Gait belt Activity Tolerance: Patient tolerated treatment well Patient left: in chair;with call bell/phone within reach Nurse Communication: Mobility status PT Visit Diagnosis: Muscle weakness (generalized) (M62.81);Other abnormalities of gait and mobility (R26.89)    Time: 4163-8453 PT Time Calculation (min) (ACUTE ONLY): 17 min   Charges:   PT Evaluation $PT Eval Low Complexity: 1 Low     PT G Codes:        Carmelia Bake, PT, DPT 11/25/2016 Pager: [phone]  York Ram E 11/25/2016, 4:26 PM

## 2016-11-25 NOTE — Progress Notes (Signed)
[place] for Enoxaparin, Warfarin Indication: history of PEs  Allergies  Allergen Reactions  . Crestor [Rosuvastatin Calcium] Other (See Comments)    Feeling very bad, aching all over.  . Erythromycin Hives  . Gabapentin Other (See Comments)    Blurred vision  . Pregabalin Other (See Comments)    Crossed vision.  . Carbidopa Other (See Comments)    Headaches, nausea, dizziness  . Macrobid [Nitrofurantoin] Hives  . Losartan Itching    Patient Measurements: Height: 5\' 7"  (170.2 cm) Weight: 159 lb 4.8 oz (72.3 kg) IBW/kg (Calculated) : 61.6   Vital Signs: Temp: 97.6 F (36.4 C) (09/22 0702) Temp Source: Oral (09/22 0702) BP: 125/66 (09/22 0702) Pulse Rate: 69 (09/22 0702)  Labs:  Recent Labs  11/23/16 0221 11/23/16 0447 11/23/16 1701 11/24/16 0551 11/25/16 0609  HGB 13.7  --   --  13.7 12.4  HCT 40.9  --   --  39.9 37.1  PLT 230  --   --  214 207  LABPROT 18.8*  --   --  22.0* 24.1*  INR 1.59  --   --  1.94 2.19  CREATININE 0.85  --   --  0.76 0.63  CKTOTAL  --  300* 200  --   --     Estimated Creatinine Clearance: 60.9 mL/min (by C-G formula based on SCr of 0.63 mg/dL).   Assessment: 56 y/oF on warfarin prior to admission for hx of PEs admitted on 11/23/16 with sepsis secondary to UTI. Pharmacy to continue warfarin and start Lovenox for subtherapeutic INR   Baseline INR 1.59, subtherapeutic  Prior to admission anticoagulation: warfarin 4 mg daily, LD unk  Today, 11/25/2016:  INR 2.19, therapeutic  CBC: Hgb 12.4, decreased.  Plt WNL  SCr: stable WNL  Drug-Drug interactions: none major, celecoxib may increase bleed risk  No bleeding issues documented  Diet ordered (CLD)   Goal of Therapy:  INR 2-3 Monitor platelets by anticoagulation protocol: Yes   Plan:   D/C Lovenox since INR is therapeutic  Warfarin 4 mg PO x 1 today  Daily INR  Monitor closely for s/sx of bleeding  Consider holding  Celecoxib if clinically appropriate  Gretta Arab PharmD, BCPS Pager [phone] 11/25/2016 11:18 AM

## 2016-11-25 NOTE — Progress Notes (Signed)
PROGRESS NOTE  [name] [name] HDQ:[id number] DOB: [birth year] DOA: 11/23/2016 PCP: Darreld Mclean, MD   LOS: 2 days   Brief Narrative / Interim history: 71 y.o.femalewith medical history significant for Parkinson's disease with fibromyalgia resident of independent living, hypertension with associated left ventricular diastolic dysfunction and mild pulmonary hypertension, dyslipidemia, chronic insomnia, peripheral vascular disease, history of renal calculi, and recurrent UTI ESBL, admitted on 9/20 with sepsis due to urinary tract infection  Assessment & Plan: Principal Problem:   Recurrent UTI Active Problems:   Sepsis ([place])   Fall at nursing home   PD (Parkinson's disease) ([place])   Sepsis due to urinary tract infection ([place])   Fibromyalgia   Hypercholesterolemia   Hypertension   Hypothyroidism, adult   Elevated CK   Pulmonary HTN ([place])   Left ventricular diastolic dysfunction   Metabolic encephalopathy   History of pulmonary embolus (PE) 2/2 hypercoagulable state    Sepsis due to UTI -Patient met sepsis criteria on admission with fever, leukocytosis, tachycardia and a source -Started on meropenem, continue -Urine blood cultures show ESBL E. coli UTI, will obtain surveillance culture today and if they remain negative at 48 hours will place a PICC line for treatment completion at the independent living -Patient will need to follow-up with urology as an outpatient for recurrent UTIs  Acute encephalopathy -Metabolic, due to UTI, appears to have resolved and she is back to baseline  Parkinson's disease -Continue Sinemet  Hypertension/pulmonary hypertension/LV diastolic dysfunction -Hold Micardis, we do not have it on formulary and patient is allergic to losartan.  Blood pressure is stable.  Respiratory status is stable  Hypothyroidism -Continue Synthroid, TSH normal at 0.5  Recurrent falls -PT consult  History of PE/hypercoagulable state -Continue Coumadin   DVT  prophylaxis: Coumadin Code Status: DNR Family Communication: no family at bedside, discussed with son [name] 9/21 Disposition Plan: ILF vs SNF when ready. PT evaluation pending   Consultants:   None  Procedures:   None   Antimicrobials:  Meropenem 9/20 >>   Subjective: - no chest pain, shortness of breath, no abdominal pain, nausea or vomiting.   Objective: Vitals:   11/24/16 1444 11/25/16 0644 11/25/16 0702 11/25/16 1222  BP: 129/60 (!) 124/54 125/66   Pulse: 85 (!) 101 69   Resp: (!) '24 20 20   ' Temp: 98 F (36.7 C) (!) 97.5 F (36.4 C) 97.6 F (36.4 C)   TempSrc: Oral Oral Oral   SpO2: 94% 99% 97%   Weight:  72.3 kg (159 lb 4.8 oz)  94.2 kg (207 lb 10.8 oz)  Height:        Intake/Output Summary (Last 24 hours) at 11/25/16 1418 Last data filed at 11/25/16 0900  Gross per 24 hour  Intake          2025.42 ml  Output             1050 ml  Net           975.42 ml   Filed Weights   11/23/16 1549 11/25/16 0644 11/25/16 1222  Weight: 90.7 kg (199 lb 15.3 oz) 72.3 kg (159 lb 4.8 oz) 94.2 kg (207 lb 10.8 oz)    Examination:  Constitutional: NAD, calm, comfortable Eyes: PERRL, lids and conjunctivae normal ENMT: Mucous membranes are moist.  Neck: normal, supple Respiratory: clear to auscultation bilaterally, no wheezing, no crackles. Normal respiratory effort.  Cardiovascular: Regular rate and rhythm, no murmurs / rubs / gallops. No LE edema. 2+ pedal pulses.  Abdomen: no  tenderness. Bowel sounds positive.  Skin: no rashes, lesions, ulcers. No induration Neurologic: non focal.   Data Reviewed: I have independently reviewed following labs and imaging studies   CBC:  Recent Labs Lab 11/23/16 0221 11/24/16 0551 11/25/16 0609  WBC 24.0* 13.5* 8.3  NEUTROABS 20.2*  --   --   HGB 13.7 13.7 12.4  HCT 40.9 39.9 37.1  MCV 84.3 83.1 84.3  PLT 230 214 993   Basic Metabolic Panel:  Recent Labs Lab 11/23/16 0221 11/23/16 1701 11/24/16 0551 11/25/16 0609  NA  135  --  137 140  K 4.0  --  3.6 3.0*  CL 98*  --  103 109  CO2 26  --  23 25  GLUCOSE 135*  --  102* 107*  BUN 20  --  22* 15  CREATININE 0.85  --  0.76 0.63  CALCIUM 9.3  --  8.6* 8.2*  MG  --  2.0  --   --    GFR: Estimated Creatinine Clearance: 73.8 mL/min (by C-G formula based on SCr of 0.63 mg/dL). Liver Function Tests:  Recent Labs Lab 11/24/16 0551  AST 20  ALT 8*  ALKPHOS 145*  BILITOT 0.7  PROT 6.4*  ALBUMIN 2.8*   No results for input(s): LIPASE, AMYLASE in the last 168 hours. No results for input(s): AMMONIA in the last 168 hours. Coagulation Profile:  Recent Labs Lab 11/23/16 0221 11/24/16 0551 11/25/16 0609  INR 1.59 1.94 2.19   Cardiac Enzymes:  Recent Labs Lab 11/23/16 0447 11/23/16 1701  CKTOTAL 300* 200   BNP (last 3 results) No results for input(s): PROBNP in the last 8760 hours. HbA1C: No results for input(s): HGBA1C in the last 72 hours. CBG: No results for input(s): GLUCAP in the last 168 hours. Lipid Profile: No results for input(s): CHOL, HDL, LDLCALC, TRIG, CHOLHDL, LDLDIRECT in the last 72 hours. Thyroid Function Tests:  Recent Labs  11/23/16 2328 11/24/16 0551  TSH  --  0.505  FREET4 1.23*  --    Anemia Panel: No results for input(s): VITAMINB12, FOLATE, FERRITIN, TIBC, IRON, RETICCTPCT in the last 72 hours. Urine analysis:    Component Value Date/Time   COLORURINE AMBER (A) 11/23/2016 0745   APPEARANCEUR CLOUDY (A) 11/23/2016 0745   LABSPEC 1.011 11/23/2016 0745   PHURINE 6.0 11/23/2016 0745   GLUCOSEU NEGATIVE 11/23/2016 0745   HGBUR LARGE (A) 11/23/2016 0745   BILIRUBINUR NEGATIVE 11/23/2016 0745   BILIRUBINUR negative 06/27/2016 1222   KETONESUR 5 (A) 11/23/2016 0745   PROTEINUR 100 (A) 11/23/2016 0745   UROBILINOGEN negative (A) 06/27/2016 1222   UROBILINOGEN 0.2 09/07/2012 1049   NITRITE NEGATIVE 11/23/2016 0745   LEUKOCYTESUR LARGE (A) 11/23/2016 0745   Sepsis Labs: Invalid input(s): PROCALCITONIN,  LACTICIDVEN  Recent Results (from the past 240 hour(s))  Urine Culture     Status: Abnormal   Collection Time: 11/23/16  7:45 AM  Result Value Ref Range Status   Specimen Description URINE, RANDOM  Final   Special Requests NONE  Final   Culture (A)  Final    >=100,000 COLONIES/mL ESCHERICHIA COLI Confirmed Extended Spectrum Beta-Lactamase Producer (ESBL) Performed at [facility], [address], [place], [place] [address]    Report Status 11/25/2016 FINAL  Final   Organism ID, Bacteria ESCHERICHIA COLI (A)  Final      Susceptibility   Escherichia coli - MIC*    AMPICILLIN >=32 RESISTANT Resistant     CEFAZOLIN >=64 RESISTANT Resistant  CEFTRIAXONE >=64 RESISTANT Resistant     CIPROFLOXACIN >=4 RESISTANT Resistant     GENTAMICIN <=1 SENSITIVE Sensitive     IMIPENEM <=0.25 SENSITIVE Sensitive     NITROFURANTOIN <=16 SENSITIVE Sensitive     TRIMETH/SULFA >=320 RESISTANT Resistant     AMPICILLIN/SULBACTAM >=32 RESISTANT Resistant     PIP/TAZO <=4 SENSITIVE Sensitive     Extended ESBL POSITIVE Resistant     * >=100,000 COLONIES/mL ESCHERICHIA COLI  Culture, blood (Routine X 2) w Reflex to ID Panel     Status: Abnormal (Preliminary result)   Collection Time: 11/23/16 11:44 AM  Result Value Ref Range Status   Specimen Description BLOOD RIGHT ANTECUBITAL  Final   Special Requests   Final    BOTTLES DRAWN AEROBIC AND ANAEROBIC Blood Culture adequate volume   Culture  Setup Time   Final    GRAM NEGATIVE RODS AEROBIC BOTTLE ONLY CRITICAL RESULT CALLED TO, READ BACK BY AND VERIFIED WITH: J GADHIA 11/24/16 @ 0931 M VESTAL    Culture (A)  Final    ESCHERICHIA COLI SUSCEPTIBILITIES TO FOLLOW Performed at [facility], [address] [place], [place] [address]    Report Status PENDING  Incomplete  Blood Culture ID Panel (Reflexed)     Status: Abnormal   Collection Time: 11/23/16 11:44 AM  Result Value Ref Range Status   Enterococcus species NOT DETECTED NOT DETECTED  Final   Listeria monocytogenes NOT DETECTED NOT DETECTED Final   Staphylococcus species NOT DETECTED NOT DETECTED Final   Staphylococcus aureus NOT DETECTED NOT DETECTED Final   Streptococcus species NOT DETECTED NOT DETECTED Final   Streptococcus agalactiae NOT DETECTED NOT DETECTED Final   Streptococcus pneumoniae NOT DETECTED NOT DETECTED Final   Streptococcus pyogenes NOT DETECTED NOT DETECTED Final   Acinetobacter baumannii NOT DETECTED NOT DETECTED Final   Enterobacteriaceae species DETECTED (A) NOT DETECTED Final    Comment: Enterobacteriaceae represent a large family of gram-negative bacteria, not a single organism. CRITICAL RESULT CALLED TO, READ BACK BY AND VERIFIED WITH: J GADHIA 11/24/16 @ 6333 M VESTAL    Enterobacter cloacae complex NOT DETECTED NOT DETECTED Final   Escherichia coli DETECTED (A) NOT DETECTED Final    Comment: CRITICAL RESULT CALLED TO, READ BACK BY AND VERIFIED WITH: J GADHIA 11/24/16 @ 0931 M VESTAL    Klebsiella oxytoca NOT DETECTED NOT DETECTED Final   Klebsiella pneumoniae NOT DETECTED NOT DETECTED Final   Proteus species NOT DETECTED NOT DETECTED Final   Serratia marcescens NOT DETECTED NOT DETECTED Final   Carbapenem resistance NOT DETECTED NOT DETECTED Final   Haemophilus influenzae NOT DETECTED NOT DETECTED Final   Neisseria meningitidis NOT DETECTED NOT DETECTED Final   Pseudomonas aeruginosa NOT DETECTED NOT DETECTED Final   Candida albicans NOT DETECTED NOT DETECTED Final   Candida glabrata NOT DETECTED NOT DETECTED Final   Candida krusei NOT DETECTED NOT DETECTED Final   Candida parapsilosis NOT DETECTED NOT DETECTED Final   Candida tropicalis NOT DETECTED NOT DETECTED Final    Comment: Performed at [facility], [place] [address] [place],  [address]  Culture, blood (Routine X 2) w Reflex to ID Panel     Status: None (Preliminary result)   Collection Time: 11/23/16  5:01 PM  Result Value Ref Range Status   Specimen Description  BLOOD LEFT HAND  Final   Special Requests IN PEDIATRIC BOTTLE Blood Culture adequate volume  Final   Culture   Final    NO GROWTH  2 DAYS Performed at [facility], [place] [address] [place], [place] [address]    Report Status PENDING  Incomplete      Radiology Studies: No results found.   Scheduled Meds: . Carbidopa-Levodopa ER  1 tablet Oral TID  . celecoxib  100 mg Oral BID  . chlorhexidine  15 mL Mouth Rinse BID  . DULoxetine  60 mg Oral Daily  . levothyroxine  150 mcg Oral QAC breakfast  . mouth rinse  15 mL Mouth Rinse q12n4p  . sodium chloride flush  3 mL Intravenous Q12H  . warfarin  4 mg Oral ONCE-1800  . Warfarin - Pharmacist Dosing Inpatient   Does not apply q1800   Continuous Infusions: . sodium chloride 75 mL/hr at 11/25/16 0429  . meropenem (MERREM) IV 1 g (11/25/16 1230)     Marzetta Board, MD, PhD [facility] Pager [phone]  If 7PM-7AM, please contact night-coverage [email or web address] Password [email or web address] 11/25/2016, 2:18 PM

## 2016-11-26 LAB — COMPREHENSIVE METABOLIC PANEL
ALT: <5 U/L — ABNORMAL LOW (ref 14–54)
ANION GAP: 7 (ref 5–15)
AST: 21 U/L (ref 15–41)
Albumin: 2.1 g/dL — ABNORMAL LOW (ref 3.5–5.0)
Alkaline Phosphatase: 106 U/L (ref 38–126)
BUN: 13 mg/dL (ref 6–20)
CALCIUM: 8.1 mg/dL — AB (ref 8.9–10.3)
CHLORIDE: 107 mmol/L (ref 101–111)
CO2: 26 mmol/L (ref 22–32)
Creatinine, Ser: 0.6 mg/dL (ref 0.44–1.00)
Glucose, Bld: 115 mg/dL — ABNORMAL HIGH (ref 65–99)
Potassium: 3.4 mmol/L — ABNORMAL LOW (ref 3.5–5.1)
SODIUM: 140 mmol/L (ref 135–145)
Total Bilirubin: 0.5 mg/dL (ref 0.3–1.2)
Total Protein: 5.3 g/dL — ABNORMAL LOW (ref 6.5–8.1)

## 2016-11-26 LAB — CBC
HCT: 37.2 % (ref 36.0–46.0)
HEMOGLOBIN: 12.4 g/dL (ref 12.0–15.0)
MCH: 28 pg (ref 26.0–34.0)
MCHC: 33.3 g/dL (ref 30.0–36.0)
MCV: 84 fL (ref 78.0–100.0)
PLATELETS: 209 10*3/uL (ref 150–400)
RBC: 4.43 MIL/uL (ref 3.87–5.11)
RDW: 13.6 % (ref 11.5–15.5)
WBC: 6.6 10*3/uL (ref 4.0–10.5)

## 2016-11-26 LAB — GLUCOSE, CAPILLARY: GLUCOSE-CAPILLARY: 155 mg/dL — AB (ref 65–99)

## 2016-11-26 LAB — PROTIME-INR
INR: 2.66
Prothrombin Time: 28.1 seconds — ABNORMAL HIGH (ref 11.4–15.2)

## 2016-11-26 LAB — CULTURE, BLOOD (ROUTINE X 2): Special Requests: ADEQUATE

## 2016-11-26 MED ORDER — POTASSIUM CHLORIDE CRYS ER 20 MEQ PO TBCR
40.0000 meq | EXTENDED_RELEASE_TABLET | Freq: Once | ORAL | Status: AC
  Administered 2016-11-26: 40 meq via ORAL
  Filled 2016-11-26: qty 2

## 2016-11-26 MED ORDER — WARFARIN SODIUM 4 MG PO TABS
4.0000 mg | ORAL_TABLET | Freq: Once | ORAL | Status: AC
  Administered 2016-11-26: 4 mg via ORAL
  Filled 2016-11-26: qty 1

## 2016-11-26 MED ORDER — ZOLPIDEM TARTRATE 5 MG PO TABS
5.0000 mg | ORAL_TABLET | Freq: Once | ORAL | Status: AC
  Administered 2016-11-26: 5 mg via ORAL
  Filled 2016-11-26: qty 1

## 2016-11-26 MED ORDER — HYDROCORTISONE 1 % EX CREA
1.0000 "application " | TOPICAL_CREAM | Freq: Three times a day (TID) | CUTANEOUS | Status: DC | PRN
  Administered 2016-11-26: 1 via TOPICAL
  Filled 2016-11-26: qty 28

## 2016-11-26 NOTE — Progress Notes (Signed)
PROGRESS NOTE  [name] [name] BTY:[id number] DOB: Oct 26, 1945 DOA: 11/23/2016 PCP: [name] Mclean, MD   LOS: 3 days   Brief Narrative / Interim history: 71 y.o.femalewith medical history significant for Parkinson's disease with fibromyalgia resident of independent living, hypertension with associated left ventricular diastolic dysfunction and mild pulmonary hypertension, dyslipidemia, chronic insomnia, peripheral vascular disease, history of renal calculi, and recurrent UTI ESBL, admitted on 9/20 with sepsis due to urinary tract infection  Assessment & Plan: Principal Problem:   Recurrent UTI Active Problems:   Sepsis ([place])   Fall at nursing home   PD (Parkinson's disease) ([place])   Sepsis due to urinary tract infection ([place])   Fibromyalgia   Hypercholesterolemia   Hypertension   Hypothyroidism, adult   Elevated CK   Pulmonary HTN ([place])   Left ventricular diastolic dysfunction   Metabolic encephalopathy   History of pulmonary embolus (PE) 2/2 hypercoagulable state    Sepsis due to UTI -Patient met sepsis criteria on admission with fever, leukocytosis, tachycardia and a source -Started on meropenem, continue -Urine blood cultures show ESBL E. coli UTI, will obtain surveillance culture today and if they remain negative at 48 hours will place a PICC line for treatment completion at the independent living.  -Patient will need to follow-up with urology as an outpatient for recurrent UTIs  Acute encephalopathy -Metabolic, due to UTI, appears to have resolved and she is back to baseline  Parkinson's disease -Continue Sinemet  Hypertension/pulmonary hypertension/LV diastolic dysfunction -Hold Micardis, we do not have it on formulary and patient is allergic to losartan.  Blood pressure is stable.  Respiratory status is stable  Hypothyroidism -Continue Synthroid, TSH normal at 0.5  Recurrent falls -PT consult  History of PE/hypercoagulable state -Continue Coumadin   DVT  prophylaxis: Coumadin Code Status: DNR Family Communication: no family at bedside, discussed with son [name] [name] 9/21 and will call today  Disposition Plan: ILF vs SNF when ready. PT evaluation pending   Consultants:   None  Procedures:   None   Antimicrobials:  Meropenem 9/20 >>   Subjective: -back to normal today. No complaints. Mildly confused  Objective: Vitals:   11/25/16 1222 11/25/16 1500 11/25/16 2114 11/26/16 0503  BP:  (!) 118/50 121/89 132/79  Pulse:  81 79 90  Resp:  '20 19 18  ' Temp:  98 F (36.7 C) 98.2 F (36.8 C) 97.9 F (36.6 C)  TempSrc:  Oral Oral Oral  SpO2:  96% 97% 95%  Weight: 94.2 kg (207 lb 10.8 oz)     Height:        Intake/Output Summary (Last 24 hours) at 11/26/16 1225 Last data filed at 11/26/16 0915  Gross per 24 hour  Intake              880 ml  Output             1200 ml  Net             -320 ml   Filed Weights   11/23/16 1549 11/25/16 0644 11/25/16 1222  Weight: 90.7 kg (199 lb 15.3 oz) 72.3 kg (159 lb 4.8 oz) 94.2 kg (207 lb 10.8 oz)    Examination:  Constitutional: NAD Respiratory: CTA Cardiovascular: RRR   Data Reviewed: I have independently reviewed following labs and imaging studies   CBC:  Recent Labs Lab 11/23/16 0221 11/24/16 0551 11/25/16 0609 11/26/16 0620  WBC 24.0* 13.5* 8.3 6.6  NEUTROABS 20.2*  --   --   --   HGB  13.7 13.7 12.4 12.4  HCT 40.9 39.9 37.1 37.2  MCV 84.3 83.1 84.3 84.0  PLT 230 214 207 122   Basic Metabolic Panel:  Recent Labs Lab 11/23/16 0221 11/23/16 1701 11/24/16 0551 11/25/16 0609 11/26/16 0620  NA 135  --  137 140 140  K 4.0  --  3.6 3.0* 3.4*  CL 98*  --  103 109 107  CO2 26  --  '23 25 26  ' GLUCOSE 135*  --  102* 107* 115*  BUN 20  --  22* 15 13  CREATININE 0.85  --  0.76 0.63 0.60  CALCIUM 9.3  --  8.6* 8.2* 8.1*  MG  --  2.0  --   --   --    GFR: Estimated Creatinine Clearance: 73.8 mL/min (by C-G formula based on SCr of 0.6 mg/dL). Liver Function Tests:  Recent  Labs Lab 11/24/16 0551 11/26/16 0620  AST 20 21  ALT 8* <5*  ALKPHOS 145* 106  BILITOT 0.7 0.5  PROT 6.4* 5.3*  ALBUMIN 2.8* 2.1*   No results for input(s): LIPASE, AMYLASE in the last 168 hours. No results for input(s): AMMONIA in the last 168 hours. Coagulation Profile:  Recent Labs Lab 11/23/16 0221 11/24/16 0551 11/25/16 0609 11/26/16 0620  INR 1.59 1.94 2.19 2.66   Cardiac Enzymes:  Recent Labs Lab 11/23/16 0447 11/23/16 1701  CKTOTAL 300* 200   BNP (last 3 results) No results for input(s): PROBNP in the last 8760 hours. HbA1C: No results for input(s): HGBA1C in the last 72 hours. CBG: No results for input(s): GLUCAP in the last 168 hours. Lipid Profile: No results for input(s): CHOL, HDL, LDLCALC, TRIG, CHOLHDL, LDLDIRECT in the last 72 hours. Thyroid Function Tests:  Recent Labs  11/23/16 2328 11/24/16 0551  TSH  --  0.505  FREET4 1.23*  --    Anemia Panel: No results for input(s): VITAMINB12, FOLATE, FERRITIN, TIBC, IRON, RETICCTPCT in the last 72 hours. Urine analysis:    Component Value Date/Time   COLORURINE AMBER (A) 11/23/2016 0745   APPEARANCEUR CLOUDY (A) 11/23/2016 0745   LABSPEC 1.011 11/23/2016 0745   PHURINE 6.0 11/23/2016 0745   GLUCOSEU NEGATIVE 11/23/2016 0745   HGBUR LARGE (A) 11/23/2016 0745   BILIRUBINUR NEGATIVE 11/23/2016 0745   BILIRUBINUR negative 06/27/2016 1222   KETONESUR 5 (A) 11/23/2016 0745   PROTEINUR 100 (A) 11/23/2016 0745   UROBILINOGEN negative (A) 06/27/2016 1222   UROBILINOGEN 0.2 09/07/2012 1049   NITRITE NEGATIVE 11/23/2016 0745   LEUKOCYTESUR LARGE (A) 11/23/2016 0745   Sepsis Labs: Invalid input(s): PROCALCITONIN, LACTICIDVEN  Recent Results (from the past 240 hour(s))  Urine Culture     Status: Abnormal   Collection Time: 11/23/16  7:45 AM  Result Value Ref Range Status   Specimen Description URINE, RANDOM  Final   Special Requests NONE  Final   Culture (A)  Final    >=100,000 COLONIES/mL  ESCHERICHIA COLI Confirmed Extended Spectrum Beta-Lactamase Producer (ESBL) Performed at [facility], [address], [place], [place] [address]    Report Status 11/25/2016 FINAL  Final   Organism ID, Bacteria ESCHERICHIA COLI (A)  Final      Susceptibility   Escherichia coli - MIC*    AMPICILLIN >=32 RESISTANT Resistant     CEFAZOLIN >=64 RESISTANT Resistant     CEFTRIAXONE >=64 RESISTANT Resistant     CIPROFLOXACIN >=4 RESISTANT Resistant     GENTAMICIN <=1 SENSITIVE Sensitive     IMIPENEM <=0.25 SENSITIVE  Sensitive     NITROFURANTOIN <=16 SENSITIVE Sensitive     TRIMETH/SULFA >=320 RESISTANT Resistant     AMPICILLIN/SULBACTAM >=32 RESISTANT Resistant     PIP/TAZO <=4 SENSITIVE Sensitive     Extended ESBL POSITIVE Resistant     * >=100,000 COLONIES/mL ESCHERICHIA COLI  Culture, blood (Routine X 2) w Reflex to ID Panel     Status: Abnormal   Collection Time: 11/23/16 11:44 AM  Result Value Ref Range Status   Specimen Description BLOOD RIGHT ANTECUBITAL  Final   Special Requests   Final    BOTTLES DRAWN AEROBIC AND ANAEROBIC Blood Culture adequate volume   Culture  Setup Time   Final    GRAM NEGATIVE RODS AEROBIC BOTTLE ONLY CRITICAL RESULT CALLED TO, READ BACK BY AND VERIFIED WITH: J GADHIA 11/24/16 @ 0931 M VESTAL    Culture (A)  Final    ESCHERICHIA COLI Confirmed Extended Spectrum Beta-Lactamase Producer (ESBL) Performed at [facility], [place] [address] [place], [place] [address]    Report Status 11/26/2016 FINAL  Final   Organism ID, Bacteria ESCHERICHIA COLI  Final      Susceptibility   Escherichia coli - MIC*    AMPICILLIN >=32 RESISTANT Resistant     CEFAZOLIN >=64 RESISTANT Resistant     CEFEPIME >=64 RESISTANT Resistant     CEFTAZIDIME RESISTANT Resistant     CEFTRIAXONE >=64 RESISTANT Resistant     CIPROFLOXACIN >=4 RESISTANT Resistant     GENTAMICIN <=1 SENSITIVE Sensitive     IMIPENEM <=0.25 SENSITIVE Sensitive     TRIMETH/SULFA >=320 RESISTANT  Resistant     AMPICILLIN/SULBACTAM >=32 RESISTANT Resistant     PIP/TAZO <=4 SENSITIVE Sensitive     Extended ESBL POSITIVE Resistant     * ESCHERICHIA COLI  Blood Culture ID Panel (Reflexed)     Status: Abnormal   Collection Time: 11/23/16 11:44 AM  Result Value Ref Range Status   Enterococcus species NOT DETECTED NOT DETECTED Final   Listeria monocytogenes NOT DETECTED NOT DETECTED Final   Staphylococcus species NOT DETECTED NOT DETECTED Final   Staphylococcus aureus NOT DETECTED NOT DETECTED Final   Streptococcus species NOT DETECTED NOT DETECTED Final   Streptococcus agalactiae NOT DETECTED NOT DETECTED Final   Streptococcus pneumoniae NOT DETECTED NOT DETECTED Final   Streptococcus pyogenes NOT DETECTED NOT DETECTED Final   Acinetobacter baumannii NOT DETECTED NOT DETECTED Final   Enterobacteriaceae species DETECTED (A) NOT DETECTED Final    Comment: Enterobacteriaceae represent a large family of gram-negative bacteria, not a single organism. CRITICAL RESULT CALLED TO, READ BACK BY AND VERIFIED WITH: J GADHIA 11/24/16 @ 0263 M VESTAL    Enterobacter cloacae complex NOT DETECTED NOT DETECTED Final   Escherichia coli DETECTED (A) NOT DETECTED Final    Comment: CRITICAL RESULT CALLED TO, READ BACK BY AND VERIFIED WITH: J GADHIA 11/24/16 @ 0931 M VESTAL    Klebsiella oxytoca NOT DETECTED NOT DETECTED Final   Klebsiella pneumoniae NOT DETECTED NOT DETECTED Final   Proteus species NOT DETECTED NOT DETECTED Final   Serratia marcescens NOT DETECTED NOT DETECTED Final   Carbapenem resistance NOT DETECTED NOT DETECTED Final   Haemophilus influenzae NOT DETECTED NOT DETECTED Final   Neisseria meningitidis NOT DETECTED NOT DETECTED Final   Pseudomonas aeruginosa NOT DETECTED NOT DETECTED Final   Candida albicans NOT DETECTED NOT DETECTED Final   Candida glabrata NOT DETECTED NOT DETECTED Final   Candida krusei NOT DETECTED NOT DETECTED Final   Candida parapsilosis NOT  DETECTED NOT  DETECTED Final   Candida tropicalis NOT DETECTED NOT DETECTED Final    Comment: Performed at [facility], [place] [address], [place], [place] [address]  Culture, blood (Routine X 2) w Reflex to ID Panel     Status: None (Preliminary result)   Collection Time: 11/23/16  5:01 PM  Result Value Ref Range Status   Specimen Description BLOOD LEFT HAND  Final   Special Requests IN PEDIATRIC BOTTLE Blood Culture adequate volume  Final   Culture   Final    NO GROWTH 2 DAYS Performed at [facility], [place] [address], [place], [place] [address]    Report Status PENDING  Incomplete  Culture, blood (Routine X 2) w Reflex to ID Panel     Status: None (Preliminary result)   Collection Time: 11/25/16  2:42 PM  Result Value Ref Range Status   Specimen Description   Final    BLOOD LEFT HAND Performed at [facility], [place] [address] [place], [place] [address]    Special Requests IN PEDIATRIC BOTTLE Blood Culture adequate volume  Final   Culture PENDING  Incomplete   Report Status PENDING  Incomplete      Radiology Studies: No results found.   Scheduled Meds: . Carbidopa-Levodopa ER  1 tablet Oral TID  . celecoxib  100 mg Oral BID  . chlorhexidine  15 mL Mouth Rinse BID  . DULoxetine  60 mg Oral Daily  . levothyroxine  150 mcg Oral QAC breakfast  . mouth rinse  15 mL Mouth Rinse q12n4p  . sodium chloride flush  3 mL Intravenous Q12H  . warfarin  4 mg Oral ONCE-1800  . Warfarin - Pharmacist Dosing Inpatient   Does not apply q1800   Continuous Infusions: . meropenem (MERREM) IV Stopped (11/26/16 0454)     Marzetta Board, MD, PhD [facility] Pager [phone]  If 7PM-7AM, please contact night-coverage [email or web address] Password [facility] 11/26/2016, 12:25 PM

## 2016-11-26 NOTE — Progress Notes (Signed)
[place] for Warfarin Indication: history of PEs  Allergies  Allergen Reactions  . Crestor [Rosuvastatin Calcium] Other (See Comments)    Feeling very bad, aching all over.  . Erythromycin Hives  . Gabapentin Other (See Comments)    Blurred vision  . Pregabalin Other (See Comments)    Crossed vision.  . Carbidopa Other (See Comments)    Headaches, nausea, dizziness  . Macrobid [Nitrofurantoin] Hives  . Losartan Itching    Patient Measurements: Height: 5\' 7"  (170.2 cm) Weight: 207 lb 10.8 oz (94.2 kg) (bed zeroed prior to weight ) IBW/kg (Calculated) : 61.6   Vital Signs: Temp: 97.9 F (36.6 C) (09/23 0503) Temp Source: Oral (09/23 0503) BP: 132/79 (09/23 0503) Pulse Rate: 90 (09/23 0503)  Labs:  Recent Labs  11/23/16 1701  11/24/16 0551 11/25/16 0609 11/26/16 0620  HGB  --   < > 13.7 12.4 12.4  HCT  --   --  39.9 37.1 37.2  PLT  --   --  214 207 209  LABPROT  --   --  22.0* 24.1* 28.1*  INR  --   --  1.94 2.19 2.66  CREATININE  --   --  0.76 0.63 0.60  CKTOTAL 200  --   --   --   --   < > = values in this interval not displayed.  Estimated Creatinine Clearance: 73.8 mL/min (by C-G formula based on SCr of 0.6 mg/dL).   Assessment: 31 y/oF on warfarin prior to admission for hx of PEs admitted on 11/23/16 with sepsis secondary to UTI. Pharmacy to continue warfarin and start Lovenox for subtherapeutic INR   Baseline INR 1.59, subtherapeutic  Prior to admission anticoagulation: warfarin 4 mg daily, LD unk  Today, 11/26/2016:  INR 2.66, therapeutic  CBC: Hgb 12.4, remains low/stable.  Plt WNL  SCr: stable WNL  Drug-Drug interactions: none major, celecoxib may increase bleed risk  No bleeding issues documented  Diet advanced to regular  Goal of Therapy:  INR 2-3 Monitor platelets by anticoagulation protocol: Yes   Plan:   Warfarin 4 mg PO x 1 today  Daily INR  Monitor closely for s/sx of  bleeding  Consider holding Celecoxib if clinically appropriate  Gretta Arab PharmD, BCPS Pager [phone] 11/26/2016 10:26 AM

## 2016-11-26 NOTE — Progress Notes (Signed)
Pharmacy Antibiotic Note  [name] [name] is a 71 y.o. female admitted on 11/23/2016 with sepsis secondary to recurrent UTI. With history of ESBL infection, Pharmacy has been consulted for Meropenem dosing.  Today is day #4 Afebrile WBC improved to WNL SCr 0.6, CrCl ~ 73 ml/min.  Plan:  Meropenem 1g IV q8h.  Monitor renal function, cultures, clinical course.   MD, If planning for outpatient IV antibiotics, consider changing Meropenem to Ertapenem for once daily dosing?  Height: 5\' 7"  (170.2 cm) Weight: 207 lb 10.8 oz (94.2 kg) (bed zeroed prior to weight ) IBW/kg (Calculated) : 61.6  Temp (24hrs), Avg:98 F (36.7 C), Min:97.9 F (36.6 C), Max:98.2 F (36.8 C)   Recent Labs Lab 11/23/16 0221 11/23/16 1144 11/24/16 0551 11/25/16 0609 11/26/16 0620  WBC 24.0*  --  13.5* 8.3 6.6  CREATININE 0.85  --  0.76 0.63 0.60  LATICACIDVEN  --  1.5  --   --   --     Estimated Creatinine Clearance: 73.8 mL/min (by C-G formula based on SCr of 0.6 mg/dL).    Allergies  Allergen Reactions  . Crestor [Rosuvastatin Calcium] Other (See Comments)    Feeling very bad, aching all over.  . Erythromycin Hives  . Gabapentin Other (See Comments)    Blurred vision  . Pregabalin Other (See Comments)    Crossed vision.  . Carbidopa Other (See Comments)    Headaches, nausea, dizziness  . Macrobid [Nitrofurantoin] Hives  . Losartan Itching    Antimicrobials this admission: 9/20 >> Ceftriaxone x 1 9/20 >> Meropenem >>  Dose adjustments this admission: --  Microbiology results: 9/20 BCx: Ecoli in aerobic bottle only (only able to collect one set) 9/20 UCx: >100k ESBL EColi (sens: imipenem) 9/22 BCx: pending   Thank you for allowing pharmacy to be a part of this patient's care.  Gretta Arab PharmD, BCPS Pager [phone] 11/26/2016 10:34 AM

## 2016-11-27 DIAGNOSIS — I10 Essential (primary) hypertension: Secondary | ICD-10-CM

## 2016-11-27 LAB — BASIC METABOLIC PANEL
Anion gap: 8 (ref 5–15)
BUN: 13 mg/dL (ref 6–20)
CALCIUM: 8.4 mg/dL — AB (ref 8.9–10.3)
CO2: 26 mmol/L (ref 22–32)
CREATININE: 0.59 mg/dL (ref 0.44–1.00)
Chloride: 104 mmol/L (ref 101–111)
GLUCOSE: 125 mg/dL — AB (ref 65–99)
Potassium: 3.6 mmol/L (ref 3.5–5.1)
Sodium: 138 mmol/L (ref 135–145)

## 2016-11-27 LAB — PROTIME-INR
INR: 3.04
PROTHROMBIN TIME: 31.2 s — AB (ref 11.4–15.2)

## 2016-11-27 LAB — PROCALCITONIN: PROCALCITONIN: 0.45 ng/mL

## 2016-11-27 NOTE — Progress Notes (Signed)
PROGRESS NOTE  [name] [name] GEX:[id number] DOB: 1945-08-09 DOA: 11/23/2016 PCP: Darreld Mclean, MD   LOS: 4 days   Brief Narrative / Interim history: 71 y.o.femalewith medical history significant for Parkinson's disease with fibromyalgia resident of independent living, hypertension with associated left ventricular diastolic dysfunction and mild pulmonary hypertension, dyslipidemia, chronic insomnia, peripheral vascular disease, history of renal calculi, and recurrent UTI ESBL, admitted on 9/20 with sepsis due to urinary tract infection  Assessment & Plan: Principal Problem:   Recurrent UTI Active Problems:   Sepsis ([place])   Fall at nursing home   PD (Parkinson's disease) ([place])   Sepsis due to urinary tract infection ([place])   Fibromyalgia   Hypercholesterolemia   Hypertension   Hypothyroidism, adult   Elevated CK   Pulmonary HTN ([place])   Left ventricular diastolic dysfunction   Metabolic encephalopathy   History of pulmonary embolus (PE) 2/2 hypercoagulable state    Sepsis due to ESBL E coli UTI with ESBL E. coli bacteremia -Patient met sepsis criteria on admission with fever, leukocytosis, tachycardia and a source -Started on meropenem, continue -Urine blood cultures show ESBL E. coli UTI, surveillance culture obtained 9/22 no growth to date, today has not been updated yet, if remains negative later today place a PICC line -Patient will need to follow-up with urology as an outpatient for recurrent UTIs, working on setting up an outpatient appointment  Acute encephalopathy -Metabolic, due to UTI, appears to have resolved and she is back to baseline  Parkinson's disease -Continue Sinemet  Hypertension/pulmonary hypertension/LV diastolic dysfunction -Hold Micardis, we do not have it on formulary and patient is allergic to losartan.  Blood pressure is stable.  Respiratory status is stable  Hypothyroidism -Continue Synthroid, TSH normal at 0.5  Recurrent falls -PT  consult recommended home health PT  History of PE/hypercoagulable state -Continue Coumadin   DVT prophylaxis: Coumadin Code Status: DNR Family Communication: no family at bedside, discussed with son [name] 9/21 and 9/23 Disposition Plan: ILF vs SNF when ready. PT evaluation pending   Consultants:   None  Procedures:   None   Antimicrobials:  Meropenem 9/20 >>   Subjective: - no chest pain, shortness of breath, no abdominal pain, nausea or vomiting.   Objective: Vitals:   11/26/16 0503 11/26/16 1355 11/26/16 2138 11/27/16 0514  BP: 132/79 (!) 139/55 128/65 (!) 159/64  Pulse: 90 72 72 67  Resp: _0 Temp: 97.9 F (36.6 C) 97.7 F (36.5 C) 98.3 F (36.8 C) 97.6 F (36.4 C)  TempSrc: Oral Oral Oral Oral  SpO2: 95% 96% 97% 96%  Weight:    94.4 kg (208 lb 1.8 oz)  Height:        Intake/Output Summary (Last 24 hours) at 11/27/16 1015 Last data filed at 11/27/16 0102  Gross per 24 hour  Intake              720 ml  Output             1450 ml  Net             -730 ml   Filed Weights   11/25/16 0644 11/25/16 1222 11/27/16 0514  Weight: 72.3 kg (159 lb 4.8 oz) 94.2 kg (207 lb 10.8 oz) 94.4 kg (208 lb 1.8 oz)    Examination:  Constitutional: NAD Respiratory: CTA Cardiovascular: RRR   Data Reviewed: I have independently reviewed following labs and imaging studies   CBC:  Recent Labs Lab 11/23/16 0221 11/24/16 0551 11/25/16  6283 11/26/16 0620  WBC 24.0* 13.5* 8.3 6.6  NEUTROABS 20.2*  --   --   --   HGB 13.7 13.7 12.4 12.4  HCT 40.9 39.9 37.1 37.2  MCV 84.3 83.1 84.3 84.0  PLT 230 214 207 662   Basic Metabolic Panel:  Recent Labs Lab 11/23/16 0221 11/23/16 1701 11/24/16 0551 11/25/16 0609 11/26/16 0620 11/27/16 0747  NA 135  --  137 140 140 138  K 4.0  --  3.6 3.0* 3.4* 3.6  CL 98*  --  103 109 107 104  CO2 26  --  _0 GLUCOSE 135*  --  102* 107* 115* 125*  BUN 20  --  22* _1 CREATININE 0.85  --  0.76 0.63 0.60  0.59  CALCIUM 9.3  --  8.6* 8.2* 8.1* 8.4*  MG  --  2.0  --   --   --   --    GFR: Estimated Creatinine Clearance: 73.9 mL/min (by C-G formula based on SCr of 0.59 mg/dL). Liver Function Tests:  Recent Labs Lab 11/24/16 0551 11/26/16 0620  AST 20 21  ALT 8* <5*  ALKPHOS 145* 106  BILITOT 0.7 0.5  PROT 6.4* 5.3*  ALBUMIN 2.8* 2.1*   No results for input(s): LIPASE, AMYLASE in the last 168 hours. No results for input(s): AMMONIA in the last 168 hours. Coagulation Profile:  Recent Labs Lab 11/23/16 0221 11/24/16 0551 11/25/16 0609 11/26/16 0620 11/27/16 0747  INR 1.59 1.94 2.19 2.66 3.04   Cardiac Enzymes:  Recent Labs Lab 11/23/16 0447 11/23/16 1701  CKTOTAL 300* 200   BNP (last 3 results) No results for input(s): PROBNP in the last 8760 hours. HbA1C: No results for input(s): HGBA1C in the last 72 hours. CBG:  Recent Labs Lab 11/26/16 2135  GLUCAP 155*   Lipid Profile: No results for input(s): CHOL, HDL, LDLCALC, TRIG, CHOLHDL, LDLDIRECT in the last 72 hours. Thyroid Function Tests: No results for input(s): TSH, T4TOTAL, FREET4, T3FREE, THYROIDAB in the last 72 hours. Anemia Panel: No results for input(s): VITAMINB12, FOLATE, FERRITIN, TIBC, IRON, RETICCTPCT in the last 72 hours. Urine analysis:    Component Value Date/Time   COLORURINE AMBER (A) 11/23/2016 0745   APPEARANCEUR CLOUDY (A) 11/23/2016 0745   LABSPEC 1.011 11/23/2016 0745   PHURINE 6.0 11/23/2016 0745   GLUCOSEU NEGATIVE 11/23/2016 0745   HGBUR LARGE (A) 11/23/2016 0745   BILIRUBINUR NEGATIVE 11/23/2016 0745   BILIRUBINUR negative 06/27/2016 1222   KETONESUR 5 (A) 11/23/2016 0745   PROTEINUR 100 (A) 11/23/2016 0745   UROBILINOGEN negative (A) 06/27/2016 1222   UROBILINOGEN 0.2 09/07/2012 1049   NITRITE NEGATIVE 11/23/2016 0745   LEUKOCYTESUR LARGE (A) 11/23/2016 0745   Sepsis Labs: Invalid input(s): PROCALCITONIN, LACTICIDVEN  Recent Results (from the past 240 hour(s))  Urine  Culture     Status: Abnormal   Collection Time: 11/23/16  7:45 AM  Result Value Ref Range Status   Specimen Description URINE, RANDOM  Final   Special Requests NONE  Final   Culture (A)  Final    >=100,000 COLONIES/mL ESCHERICHIA COLI Confirmed Extended Spectrum Beta-Lactamase Producer (ESBL) Performed at [facility], [address] [place], [place] [address]    Report Status 11/25/2016 FINAL  Final   Organism ID, Bacteria ESCHERICHIA COLI (A)  Final      Susceptibility   Escherichia coli - MIC*    AMPICILLIN >=32 RESISTANT Resistant     CEFAZOLIN >=64 RESISTANT  Resistant     CEFTRIAXONE >=64 RESISTANT Resistant     CIPROFLOXACIN >=4 RESISTANT Resistant     GENTAMICIN <=1 SENSITIVE Sensitive     IMIPENEM <=0.25 SENSITIVE Sensitive     NITROFURANTOIN <=16 SENSITIVE Sensitive     TRIMETH/SULFA >=320 RESISTANT Resistant     AMPICILLIN/SULBACTAM >=32 RESISTANT Resistant     PIP/TAZO <=4 SENSITIVE Sensitive     Extended ESBL POSITIVE Resistant     * >=100,000 COLONIES/mL ESCHERICHIA COLI  Culture, blood (Routine X 2) w Reflex to ID Panel     Status: Abnormal   Collection Time: 11/23/16 11:44 AM  Result Value Ref Range Status   Specimen Description BLOOD RIGHT ANTECUBITAL  Final   Special Requests   Final    BOTTLES DRAWN AEROBIC AND ANAEROBIC Blood Culture adequate volume   Culture  Setup Time   Final    GRAM NEGATIVE RODS AEROBIC BOTTLE ONLY CRITICAL RESULT CALLED TO, READ BACK BY AND VERIFIED WITH: J GADHIA 11/24/16 @ 0931 M VESTAL    Culture (A)  Final    ESCHERICHIA COLI Confirmed Extended Spectrum Beta-Lactamase Producer (ESBL) Performed at [facility], [address] [place], [place] [address]    Report Status 11/26/2016 FINAL  Final   Organism ID, Bacteria ESCHERICHIA COLI  Final      Susceptibility   Escherichia coli - MIC*    AMPICILLIN >=32 RESISTANT Resistant     CEFAZOLIN >=64 RESISTANT Resistant     CEFEPIME >=64 RESISTANT Resistant      CEFTAZIDIME RESISTANT Resistant     CEFTRIAXONE >=64 RESISTANT Resistant     CIPROFLOXACIN >=4 RESISTANT Resistant     GENTAMICIN <=1 SENSITIVE Sensitive     IMIPENEM <=0.25 SENSITIVE Sensitive     TRIMETH/SULFA >=320 RESISTANT Resistant     AMPICILLIN/SULBACTAM >=32 RESISTANT Resistant     PIP/TAZO <=4 SENSITIVE Sensitive     Extended ESBL POSITIVE Resistant     * ESCHERICHIA COLI  Blood Culture ID Panel (Reflexed)     Status: Abnormal   Collection Time: 11/23/16 11:44 AM  Result Value Ref Range Status   Enterococcus species NOT DETECTED NOT DETECTED Final   Listeria monocytogenes NOT DETECTED NOT DETECTED Final   Staphylococcus species NOT DETECTED NOT DETECTED Final   Staphylococcus aureus NOT DETECTED NOT DETECTED Final   Streptococcus species NOT DETECTED NOT DETECTED Final   Streptococcus agalactiae NOT DETECTED NOT DETECTED Final   Streptococcus pneumoniae NOT DETECTED NOT DETECTED Final   Streptococcus pyogenes NOT DETECTED NOT DETECTED Final   Acinetobacter baumannii NOT DETECTED NOT DETECTED Final   Enterobacteriaceae species DETECTED (A) NOT DETECTED Final    Comment: Enterobacteriaceae represent a large family of gram-negative bacteria, not a single organism. CRITICAL RESULT CALLED TO, READ BACK BY AND VERIFIED WITH: J GADHIA 11/24/16 @ 1191 M VESTAL    Enterobacter cloacae complex NOT DETECTED NOT DETECTED Final   Escherichia coli DETECTED (A) NOT DETECTED Final    Comment: CRITICAL RESULT CALLED TO, READ BACK BY AND VERIFIED WITH: J GADHIA 11/24/16 @ 0931 M VESTAL    Klebsiella oxytoca NOT DETECTED NOT DETECTED Final   Klebsiella pneumoniae NOT DETECTED NOT DETECTED Final   Proteus species NOT DETECTED NOT DETECTED Final   Serratia marcescens NOT DETECTED NOT DETECTED Final   Carbapenem resistance NOT DETECTED NOT DETECTED Final   Haemophilus influenzae NOT DETECTED NOT DETECTED Final   Neisseria meningitidis NOT DETECTED NOT DETECTED Final   Pseudomonas aeruginosa  NOT DETECTED NOT DETECTED  Final   Candida albicans NOT DETECTED NOT DETECTED Final   Candida glabrata NOT DETECTED NOT DETECTED Final   Candida krusei NOT DETECTED NOT DETECTED Final   Candida parapsilosis NOT DETECTED NOT DETECTED Final   Candida tropicalis NOT DETECTED NOT DETECTED Final    Comment: Performed at [facility], [place] [address] [place], [place] [address]  Culture, blood (Routine X 2) w Reflex to ID Panel     Status: None (Preliminary result)   Collection Time: 11/23/16  5:01 PM  Result Value Ref Range Status   Specimen Description BLOOD LEFT HAND  Final   Special Requests IN PEDIATRIC BOTTLE Blood Culture adequate volume  Final   Culture   Final    NO GROWTH 3 DAYS Performed at [facility], [place] [address], [place], [place] [address]    Report Status PENDING  Incomplete  Culture, blood (Routine X 2) w Reflex to ID Panel     Status: None (Preliminary result)   Collection Time: 11/25/16  2:42 PM  Result Value Ref Range Status   Specimen Description BLOOD LEFT ANTECUBITAL  Final   Special Requests IN PEDIATRIC BOTTLE Blood Culture adequate volume  Final   Culture   Final    NO GROWTH < 24 HOURS Performed at [facility], [place] [address] [place],  [address]    Report Status PENDING  Incomplete  Culture, blood (Routine X 2) w Reflex to ID Panel     Status: None (Preliminary result)   Collection Time: 11/25/16  2:42 PM  Result Value Ref Range Status   Specimen Description BLOOD LEFT HAND  Final   Special Requests IN PEDIATRIC BOTTLE Blood Culture adequate volume  Final   Culture   Final    NO GROWTH < 24 HOURS Performed at [facility], [place] [address] [place],  [address]    Report Status PENDING  Incomplete      Radiology Studies: No results found.   Scheduled Meds: . Carbidopa-Levodopa ER  1 tablet Oral TID  . celecoxib  100 mg Oral BID  . chlorhexidine  15 mL Mouth Rinse BID  . DULoxetine  60 mg Oral Daily  .  levothyroxine  150 mcg Oral QAC breakfast  . mouth rinse  15 mL Mouth Rinse q12n4p  . sodium chloride flush  3 mL Intravenous Q12H  . Warfarin - Pharmacist Dosing Inpatient   Does not apply q1800   Continuous Infusions: . meropenem (MERREM) IV Stopped (11/27/16 0430)     Marzetta Board, MD, PhD [facility] Pager [phone]  If 7PM-7AM, please contact night-coverage [email or web address] Password [email or web address] 11/27/2016, 10:15 AM

## 2016-11-27 NOTE — Care Management Important Message (Addendum)
Important Message  Patient Details IM Letter given to Rhonda/Case Manager to present to Patient Name: [name] [name] MRN: [id number] Date of Birth: 12-05-1945   Medicare Important Message Given:  Yes    [name], [name] 11/27/2016, 12:28 [place] Message  Patient Details  Name: [name] [name] MRN: [id number] Date of Birth: 04-23-1945   Medicare Important Message Given:  Yes    [name], [name] 11/27/2016, 12:28 PM

## 2016-11-27 NOTE — Care Management Note (Signed)
Case Management Note  Patient Details  Name: [name] [name] MRN: [id number] Date of Birth: Jul 07, 1945  Subjective/Objective:                  Principal Problem:   Recurrent UTI Active Problems:   Sepsis ([place])   Fall at nursing home   PD (Parkinson's disease) [facility])   Sepsis due to urinary tract infection ([place])   Fibromyalgia   Hypercholesterolemia   Hypertension   Hypothyroidism, adult   Elevated CK   Pulmonary HTN ([facility])   Left ventricular diastolic dysfunction   Metabolic encephalopathy   History of pulmonary embolus (PE) 2/2 hypercoagulable state  Action/Plan: Date:  November 27, 2016 Chart reviewed for concurrent status and case management needs.  Will continue to follow patient progress.  Discharge Planning: following for needs  Expected discharge date: [id number]  Velva Harman, BSN, [place], [place]     Expected Discharge Date:   (unknown)               Expected Discharge Plan:  Assisted Living / Rest Home  In-House Referral:  Clinical Social Work  Discharge planning Services  CM Consult  Post Acute Care Choice:    Choice offered to:     DME Arranged:    DME Agency:     HH Arranged:    [place] Agency:     Status of Service:  In process, will continue to follow  If discussed at Long Length of Stay Meetings, dates discussed:    Additional Comments:  Leeroy Cha, RN 11/27/2016, 10:38 AM

## 2016-11-27 NOTE — Progress Notes (Signed)
Physical Therapy Treatment Patient Details Name: [name] [name] MRN: [id number] DOB: 1945/07/09 Today's Date: 11/27/2016    History of Present Illness 71 y.o. female with medical history significant for Parkinson's disease with fibromyalgia, hypertension with associated left ventricular diastolic dysfunction and mild pulmonary hypertension, dyslipidemia, chronic insomnia, peripheral vascular disease, history of renal calculi, and recurrent UTI ESBL, admitted on 9/20 with sepsis due to urinary tract infection    PT Comments    Assisted pt OOB to amb to bathroom.  Assisted in bathroom with hygiene.  Assisted with amb a greater distance in hallway.  Pt stated she walks "a lot" at her [employer].  "I like to go and sit on the front porch".  Pt tolerated session well.   Follow Up Recommendations  Home health PT (at [facility])     Equipment Recommendations  None recommended by PT    Recommendations for Other Services       Precautions / Restrictions Precautions Precautions: Fall Precaution Comments: Parkinson's/Incont Restrictions Weight Bearing Restrictions: No    Mobility  Bed Mobility Overal bed mobility: Needs Assistance Bed Mobility: Supine to Sit     Supine to sit: Min guard;HOB elevated     General bed mobility comments: increased time  Transfers Overall transfer level: Needs assistance Equipment used: Rolling walker (2 wheeled);None Transfers: Sit to/from [facility] Sit to Stand: Supervision;Min guard Stand pivot transfers: Supervision;Min guard       General transfer comment: min/guard for safety and 25% VC's to reach back prior to "flop" as pt stated.   Ambulation/Gait Ambulation/Gait assistance: Min guard;Supervision Ambulation Distance (Feet): 85 Feet Assistive device: Rolling walker (2 wheeled) Gait Pattern/deviations: Step-through pattern;Decreased stride length;Shuffle;Narrow base of support Gait velocity: decreased    General Gait Details: short small steps, steady with RW, distance increased   Stairs            Wheelchair Mobility    Modified Rankin (Stroke Patients Only)       Balance                                            Cognition Arousal/Alertness: Awake/alert Behavior During Therapy: WFL for tasks assessed/performed Overall Cognitive Status: Within Functional Limits for tasks assessed                                        Exercises      General Comments        Pertinent Vitals/Pain Pain Assessment: No/denies pain    Home Living                      Prior Function            PT Goals (current goals can now be found in the care plan section) Progress towards PT goals: Progressing toward goals    Frequency    Min 3X/week      PT Plan Current plan remains appropriate    Co-evaluation              AM-PAC PT "6 Clicks" Daily Activity  Outcome Measure  Difficulty turning over in bed (including adjusting bedclothes, sheets and blankets)?: A Little Difficulty moving from lying on back to sitting on the side of the bed? : A Lot  Difficulty sitting down on and standing up from a chair with arms (e.g., wheelchair, bedside commode, etc,.)?: Unable Help needed moving to and from a bed to chair (including a wheelchair)?: A Little Help needed walking in hospital room?: A Little Help needed climbing 3-5 steps with a railing? : A Lot 6 Click Score: 14    End of Session Equipment Utilized During Treatment: Gait belt Activity Tolerance: Patient tolerated treatment well Patient left: in chair;with call bell/phone within reach Nurse Communication: Mobility status PT Visit Diagnosis: Muscle weakness (generalized) (M62.81);Other abnormalities of gait and mobility (R26.89)     Time: 5462-7035 PT Time Calculation (min) (ACUTE ONLY): 25 min  Charges:  $Gait Training: 8-22 mins $Therapeutic Activity: 8-22 mins                     G Codes:       Rica Koyanagi  PTA [facility]  Acute  Rehab Pager      [phone]

## 2016-11-27 NOTE — Progress Notes (Signed)
[place] for Warfarin Indication: history of PEs  Allergies  Allergen Reactions  . Crestor [Rosuvastatin Calcium] Other (See Comments)    Feeling very bad, aching all over.  . Erythromycin Hives  . Gabapentin Other (See Comments)    Blurred vision  . Pregabalin Other (See Comments)    Crossed vision.  . Carbidopa Other (See Comments)    Headaches, nausea, dizziness  . Macrobid [Nitrofurantoin] Hives  . Losartan Itching    Patient Measurements: Height: 5\' 7"  (170.2 cm) Weight: 208 lb 1.8 oz (94.4 kg) IBW/kg (Calculated) : 61.6   Vital Signs: Temp: 97.6 F (36.4 C) (09/24 0514) Temp Source: Oral (09/24 0514) BP: 159/64 (09/24 0514) Pulse Rate: 67 (09/24 0514)  Labs:  Recent Labs  11/25/16 0609 11/26/16 0620 11/27/16 0747  HGB 12.4 12.4  --   HCT 37.1 37.2  --   PLT 207 209  --   LABPROT 24.1* 28.1* 31.2*  INR 2.19 2.66 3.04  CREATININE 0.63 0.60 0.59    Estimated Creatinine Clearance: 73.9 mL/min (by C-G formula based on SCr of 0.59 mg/dL).   Assessment: 44 y/oF on warfarin prior to admission for hx of PEs admitted on 11/23/16 with sepsis secondary to UTI. Pharmacy to continue warfarin and start Lovenox for subtherapeutic INR   Baseline INR 1.59, subtherapeutic  Prior to admission anticoagulation: warfarin 4 mg daily, LD unk  Today, 11/27/2016:  INR now slightly supratherapeutic  CBC: slightly low but stable; Plt WNL  SCr: stable WNL  Drug-Drug interactions: none major, celecoxib may increase bleed risk  No bleeding issues documented  Diet advanced to regular, eating 75-100% of meals  Goal of Therapy:  INR 2-3 Monitor platelets by anticoagulation protocol: Yes   Plan:   Hold warfarin today, likely safe to resume tomorrow as long as no additional rise  Daily INR  Monitor closely for s/sx of bleeding  Consider holding Celecoxib if clinically appropriate  Reuel Boom, PharmD, BCPS Pager:  [phone] 11/27/2016, 1:46 PM

## 2016-11-28 LAB — CULTURE, BLOOD (ROUTINE X 2)
CULTURE: NO GROWTH
SPECIAL REQUESTS: ADEQUATE

## 2016-11-28 LAB — PROTIME-INR
INR: 2.79
Prothrombin Time: 29.2 seconds — ABNORMAL HIGH (ref 11.4–15.2)

## 2016-11-28 MED ORDER — ERTAPENEM IV (FOR PTA / DISCHARGE USE ONLY): 1.0000 g | INTRAVENOUS | 0 refills | Status: AC

## 2016-11-28 MED ORDER — WARFARIN SODIUM 3 MG PO TABS
3.0000 mg | ORAL_TABLET | Freq: Once | ORAL | Status: AC
  Administered 2016-11-28: 3 mg via ORAL
  Filled 2016-11-28: qty 1

## 2016-11-28 MED ORDER — SODIUM CHLORIDE 0.9% FLUSH: 10.0000 mL | INTRAVENOUS | Status: DC | PRN

## 2016-11-28 MED ORDER — HYDROCODONE-ACETAMINOPHEN 5-325 MG PO TABS: 1.0000 | ORAL_TABLET | Freq: Four times a day (QID) | ORAL | 0 refills | Status: DC | PRN

## 2016-11-28 NOTE — Discharge Summary (Signed)
Physician Discharge Summary  [name] [name] ZSM:[id number] DOB: 08/27/1945 DOA: 11/23/2016  PCP: [name] Mclean, MD  Admit date: 11/23/2016 Discharge date: 11/28/2016  Admitted From: ILF Disposition:  ILF  Recommendations for Outpatient Follow-up:  1. Follow up with PCP in 1-2 weeks 2. Continue Invaz for 12 additional days 3. Referral has been made to [facility], patient will be contacted by clinic per triage [name] [name]: PT, RN Equipment/Devices: none  Discharge Condition: stable CODE STATUS: DNR Diet recommendation: regular  HPI: Per [name] [name], [name] [name] is a 71 y.o. female with medical history significant for Parkinson's disease with fibromyalgia resident of independent living, hypertension with associated left ventricular diastolic dysfunction and mild pulmonary hypertension, dyslipidemia, chronic insomnia, peripheral vascular disease, history of renal calculi, and recurrent UTI ESBL positive with similar bacteremia admissions. According to the chart and family patient has been having gait disturbance worse than baseline with falls and progressive altered mentation for several days which is typical of her presentation with UTI. She fell last night around 11 PM and utilized her Life Alert to summon help. EMS was called and she was transported to the hospital. Upon arrival she was afebrile and hemodynamically stable. She was found to have a significant leukocytosis, elevated CK and appeared clinically dehydrated. While holding in the ER around 7 AM she was found have a rectal temperature 101.1. Subsequent urinalysis was consistent with UTI. Patient has been given an empiric dose of Rocephin IV.  Hospital Course: Discharge Diagnoses:  Principal Problem:   Recurrent UTI Active Problems:   Sepsis ([place])   Fall at nursing home   PD (Parkinson's disease) ([place])   Sepsis due to urinary tract infection ([place])   Fibromyalgia   Hypercholesterolemia   Hypertension  Hypothyroidism, adult   Elevated CK   Pulmonary HTN ([facility])   Left ventricular diastolic dysfunction   Metabolic encephalopathy   History of pulmonary embolus (PE) 2/2 hypercoagulable state   Sepsis due to ESBL E coli UTI with ESBL E. coli bacteremia -Patient met sepsis criteria on admission with fever, leukocytosis, tachycardia and a source.  Started on meropenem empirically given history of ESBL infections, and she improved.  Urine as well as blood cultures did in fact speciate ESBL E. Coli.  She has surveillance cultures done which have remained negative.  PICC line was placed on 9/25 and patient was discharged home with 12 additional days of antibiotics to complete a 14 day course since negative culture set.  Patient wished to discuss with urology regarding her recurrent urinary tract infections, I have called [facility] and provided with the patient information and they will contact patient following discharge to schedule a follow-up appointment Acute encephalopathy -Metabolic, due to UTI, resolved and she is back to baseline Parkinson's disease -Continue Sinemet Hypertension/pulmonary hypertension/LV diastolic dysfunction -resume home medications on discharge Hypothyroidism -Continue Synthroid, TSH normal at 0.5 Recurrent falls -PT consult recommended home health PT, this was arranged History of PE/hypercoagulable state -Continue Coumadin   Discharge Instructions  Discharge Instructions    Home infusion instructions [facility] May follow [place] Dosing Protocol; May administer Cathflo as needed to maintain patency of vascular access device.; Flushing of vascular access device: per [facility] Protocol: 0.9% NaCl pre/post medica...    Complete by:  As directed    Instructions:  May follow [place] Dosing Protocol   Instructions:  May administer Cathflo as needed to maintain patency of vascular access device.   Instructions:  Flushing of vascular  access device: per [facility]  Protocol: 0.9% NaCl pre/post medication administration and prn patency; Heparin 100 u/ml, 56m for implanted ports and Heparin 10u/ml, 546mfor all other central venous catheters.   Instructions:  May follow [facility] Anaphylaxis Protocol for First Dose Administration in the home: 0.9% NaCl at 25-50 ml/hr to maintain IV access for protocol meds. Epinephrine 0.3 ml IV/IM PRN and Benadryl 25-50 IV/IM PRN s/s of anaphylaxis.   Instructions:  [facility] Coordinator (RN) to assist per patient IV care needs in the home PRN.     Allergies as of 11/28/2016      Reactions   Crestor [rosuvastatin Calcium] Other (See Comments)   Feeling very bad, aching all over.   Erythromycin Hives   Gabapentin Other (See Comments)   Blurred vision   Pregabalin Other (See Comments)   Crossed vision.   Carbidopa Other (See Comments)   Headaches, nausea, dizziness   Macrobid [nitrofurantoin] Hives   Losartan Itching      Medication List    TAKE these medications   acetaminophen 500 MG tablet Commonly known as:  TYLENOL Take 500 mg by mouth every 6 (six) hours as needed for mild pain.   albuterol 108 (90 Base) MCG/ACT inhaler Commonly known as:  PROVENTIL HFA;VENTOLIN HFA Inhale 2 puffs into the lungs every 6 (six) hours as needed for wheezing or shortness of breath.   atorvastatin 10 MG tablet Commonly known as:  LIPITOR Take 1 tablet (10 mg total) by mouth daily.   BIOFREEZE EX Apply 1 application topically daily as needed (for pain).   Carbidopa-Levodopa ER 23.75-95 MG Cpcr Commonly known as:  RYTARY Take 1 tablet by mouth 3 (three) times daily.   celecoxib 100 MG capsule Commonly known as:  CELEBREX Take 1 capsule (100 mg total) by mouth 2 (two) times daily.   cetirizine 10 MG tablet Commonly known as:  ZYRTEC Take 1 tablet (10 mg total) by mouth daily.   cyanocobalamin 500 MCG tablet Take 500 mcg by mouth daily.   cycloSPORINE 0.05 % ophthalmic emulsion Commonly known as:   RESTASIS Place 1 drop into both eyes 2 (two) times daily.   DULoxetine 60 MG capsule Commonly known as:  CYMBALTA Take 1 capsule (60 mg total) by mouth daily.   ertapenem IVPB Commonly known as:  INVANZ Inject 1 g into the vein daily. Indication:  ESBL bacteremia Last Day of Therapy:  12/10/16 Labs - Once weekly:  CBC/D and BMP, Labs - Every other week:  ESR and CRP   FISH OIL PO Take 1 capsule by mouth daily.   fluticasone 50 MCG/ACT nasal spray Commonly known as:  FLONASE Place 1 spray into both nostrils daily.   HYDROcodone-acetaminophen 5-325 MG tablet Commonly known as:  NORCO/VICODIN Take 1 tablet by mouth every 6 (six) hours as needed for moderate pain or severe pain.   magnesium oxide 400 (241.3 Mg) MG tablet Commonly known as:  MAG-OX Take 0.5 tablets (200 mg total) by mouth daily.   Melatonin 5 MG Tabs Take 1 tablet by mouth at bedtime as needed (for sleep).   methocarbamol 500 MG tablet Commonly known as:  ROBAXIN Take 500 mg by mouth every 6 (six) hours as needed for muscle spasms.   nicotine polacrilex 2 MG gum Commonly known as:  NICORETTE Take 2 mg by mouth as needed for smoking cessation.   ondansetron 4 MG disintegrating tablet Commonly known as:  ZOFRAN ODT Take 1 tablet (4 mg total) by mouth every  8 (eight) hours as needed for nausea or vomiting.   Oxycodone HCl 10 MG Tabs Take 1/2 tablet daily as needed for neck pain. If pain persists may take another 1/2 tablet per day What changed:  how much to take  how to take this  when to take this  additional instructions   SYNTHROID 150 MCG tablet Generic drug:  levothyroxine Take 1 tablet (150 mcg total) by mouth daily before breakfast.   telmisartan-hydrochlorothiazide 40-12.5 MG tablet Commonly known as:  MICARDIS HCT Take 1 tablet by mouth daily.   Vitamin D3 5000 units Caps Take 5,000 Units by mouth daily.   warfarin 4 MG tablet Commonly known as:  COUMADIN Take 1 tablet (4 mg total) by  mouth See admin instructions. Adjust as directed by MD What changed:  when to take this  additional instructions   zolpidem 5 MG tablet Commonly known as:  AMBIEN TAKE 1 TABLET BY MOUTH ONCE DAILY AT BEDTIME AS NEEDED FOR SLEEP            Home Infusion Instuctions        Start     Ordered   11/28/16 0000  Home infusion instructions [facility] May follow [place] Dosing Protocol; May administer Cathflo as needed to maintain patency of vascular access device.; Flushing of vascular access device: per [facility] Protocol: 0.9% NaCl pre/post medica...    Question Answer Comment  Instructions May follow [place] Dosing Protocol   Instructions May administer Cathflo as needed to maintain patency of vascular access device.   Instructions Flushing of vascular access device: per [facility] Protocol: 0.9% NaCl pre/post medication administration and prn patency; Heparin 100 u/ml, 31m for implanted ports and Heparin 10u/ml, 530mfor all other central venous catheters.   Instructions May follow [facility] Anaphylaxis Protocol for First Dose Administration in the home: 0.9% NaCl at 25-50 ml/hr to maintain IV access for protocol meds. Epinephrine 0.3 ml IV/IM PRN and Benadryl 25-50 IV/IM PRN s/s of anaphylaxis.   Instructions [facility] Infusion Coordinator (RN) to assist per patient IV care needs in the home PRN.      11/28/16 1103       Discharge Care Instructions        Start     Ordered   11/28/16 0000  Home infusion instructions [facility] May follow [facility] Protocol; May administer Cathflo as needed to maintain patency of vascular access device.; [facility]: 0.9% NaCl pre/post medica...    Question Answer Comment  Instructions May follow [facility]   Instructions May administer Cathflo as needed to maintain patency of vascular access device.   Instructions [facility] 0.[facility]% NaCl pre/post medication administration and prn patency; Heparin 100 u/ml, 53m71mor implanted ports and Heparin 10u/ml, 53ml31mr all other central venous catheters.   Instructions May follow [facility] Anaphylaxis Protocol for First Dose Administration in the home: 0.9% NaCl at 25-50 ml/hr to maintain IV access for protocol meds. Epinephrine 0.3 ml IV/IM PRN and Benadryl 25-50 IV/IM PRN s/s of anaphylaxis.   Instructions [facility] Infusion Coordinator (RN) to assist per patient IV care needs in the home PRN.      11/28/16 1103   11/28/16 0000  ertapenem ([facility]  Every 24 hours     11/28/16 1103   11/28/16 0000  HYDROcodone-acetaminophen (NORCO/VICODIN) 5-325 MG tablet  Every 6 hours PRN  11/28/16 1103     Follow-up Information    [facility]. Schedule an appointment as soon as possible for a visit in 2 week(s).   Why:  office will call you in 3-4 days to set up an appointment Contact information: [place] [phone]          Consultations:  None   Procedures/Studies:  Dg Chest 2 View  Result Date: 11/23/2016 CLINICAL DATA:  Patient fell at 2300 hours. Multiple falls over the past few weeks. History of Parkinson's and fibromyalgia. Generalize pain. Lethargy. EXAM: CHEST  2 VIEW COMPARISON:  None. FINDINGS: Shallow inspiration. Normal heart size and pulmonary vascularity. No focal airspace disease or consolidation in the lungs. No blunting of costophrenic angles. No pneumothorax. Mediastinal contours appear intact. Degenerative changes in the spine. Calcification of the aorta. Surgical clips in the right upper quadrant. IMPRESSION: No active cardiopulmonary disease. Electronically Signed   By: Lucienne Capers M.D.   On: 11/23/2016 06:57   Ct Head Wo Contrast  Result Date: 11/23/2016 CLINICAL DATA:  Head trauma. " "Does not feel right" . Initial encounter. EXAM: CT HEAD WITHOUT CONTRAST TECHNIQUE:  Contiguous axial images were obtained from the base of the skull through the vertex without intravenous contrast. COMPARISON:  09/17/2016 FINDINGS: Brain: No evidence of acute infarction, hemorrhage, hydrocephalus, extra-axial collection or mass lesion/mass effect. Chronic ventriculomegaly that is likely atrophy. Chronic small vessel ischemia with thin confluent band in the periventricular white matter. Cavum septum pellucidum et vergae, incidental. Vascular: Atherosclerotic calcification Skull: No acute or aggressive finding. Sinuses/Orbits: No evidence of injury. Bilateral cataract resection. IMPRESSION: 1. No evidence of intracranial injury.  Stable compared to prior. 2. Atrophy and mild chronic small vessel ischemia. Electronically Signed   By: Monte Fantasia M.D.   On: 11/23/2016 07:08   US Renal  Result Date: 11/23/2016 CLINICAL DATA:  Recurrent urinary tract infection, Graves disease, hypertension EXAM: RENAL / URINARY TRACT ULTRASOUND COMPLETE COMPARISON:  Pelvis film of 12/17/2015 FINDINGS: Right Kidney: Length: 13.1 cm. No hydronephrosis is seen. The echogenicity of the renal parenchyma is unremarkable. Left Kidney: Length: 14.0 cm. No hydronephrosis is noted. The renal parenchymal echogenicity is normal. Bladder: The urinary bladder is not optimally distended but no abnormality is seen. Incidental imaging of the liver shows echogenicity throughout the liver parenchyma suggesting fatty infiltration. IMPRESSION: 1. No hydronephrosis. 2. Renal parenchymal echogenicity within normal limits. 3. Somewhat echogenic liver parenchyma noted incidentally suggesting diffuse fatty infiltration. Electronically Signed   By: [name] M.D.   On: 11/23/2016 11:13      Subjective: - no chest pain, shortness of breath, no abdominal pain, nausea or vomiting.   Discharge Exam: Vitals:   11/27/16 2149 11/28/16 0514  BP: (!) 160/66 (!) 157/75  Pulse: 69 71  Resp: 20 20  Temp: 97.6 F (36.4 C) 98.2 F (36.8  C)  SpO2: 97% 93%    General: Pt is alert, awake, not in acute distress Cardiovascular: RRR, S1/S2 +, no rubs, no gallops Respiratory: CTA bilaterally, no wheezing, no rhonchi Abdominal: Soft, NT, ND, bowel sounds + Extremities: no edema, no cyanosis    The results of significant diagnostics from this hospitalization (including imaging, microbiology, ancillary and laboratory) are listed below for reference.     Microbiology: Recent Results (from the past 240 hour(s))  Urine Culture     Status: Abnormal   Collection Time: 11/23/16  7:45 AM  Result Value Ref Range Status  Specimen Description URINE, RANDOM  Final   Special Requests NONE  Final   Culture (A)  Final    >=100,000 COLONIES/mL ESCHERICHIA COLI Confirmed Extended Spectrum Beta-Lactamase Producer (ESBL) Performed at [facility], [place] [address] [place], [place] [address]    Report Status 11/25/2016 FINAL  Final   Organism ID, Bacteria ESCHERICHIA COLI (A)  Final      Susceptibility   Escherichia coli - MIC*    AMPICILLIN >=32 RESISTANT Resistant     CEFAZOLIN >=64 RESISTANT Resistant     CEFTRIAXONE >=64 RESISTANT Resistant     CIPROFLOXACIN >=4 RESISTANT Resistant     GENTAMICIN <=1 SENSITIVE Sensitive     IMIPENEM <=0.25 SENSITIVE Sensitive     NITROFURANTOIN <=16 SENSITIVE Sensitive     TRIMETH/SULFA >=320 RESISTANT Resistant     AMPICILLIN/SULBACTAM >=32 RESISTANT Resistant     PIP/TAZO <=4 SENSITIVE Sensitive     Extended ESBL POSITIVE Resistant     * >=100,000 COLONIES/mL ESCHERICHIA COLI  Culture, blood (Routine X 2) w Reflex to ID Panel     Status: Abnormal   Collection Time: 11/23/16 11:44 AM  Result Value Ref Range Status   Specimen Description BLOOD RIGHT ANTECUBITAL  Final   Special Requests   Final    BOTTLES DRAWN AEROBIC AND ANAEROBIC Blood Culture adequate volume   Culture  Setup Time   Final    GRAM NEGATIVE RODS AEROBIC BOTTLE ONLY CRITICAL RESULT CALLED TO, READ BACK BY AND  VERIFIED WITH: J GADHIA 11/24/16 @ 0931 M VESTAL    Culture (A)  Final    ESCHERICHIA COLI Confirmed Extended Spectrum Beta-Lactamase Producer (ESBL) Performed at [facility], [place] [address], [place],  [address]    Report Status 11/26/2016 FINAL  Final   Organism ID, Bacteria ESCHERICHIA COLI  Final      Susceptibility   Escherichia coli - MIC*    AMPICILLIN >=32 RESISTANT Resistant     CEFAZOLIN >=64 RESISTANT Resistant     CEFEPIME >=64 RESISTANT Resistant     CEFTAZIDIME RESISTANT Resistant     CEFTRIAXONE >=64 RESISTANT Resistant     CIPROFLOXACIN >=4 RESISTANT Resistant     GENTAMICIN <=1 SENSITIVE Sensitive     IMIPENEM <=0.25 SENSITIVE Sensitive     TRIMETH/SULFA >=320 RESISTANT Resistant     AMPICILLIN/SULBACTAM >=32 RESISTANT Resistant     PIP/TAZO <=4 SENSITIVE Sensitive     Extended ESBL POSITIVE Resistant     * ESCHERICHIA COLI  Blood Culture ID Panel (Reflexed)     Status: Abnormal   Collection Time: 11/23/16 11:44 AM  Result Value Ref Range Status   Enterococcus species NOT DETECTED NOT DETECTED Final   Listeria monocytogenes NOT DETECTED NOT DETECTED Final   Staphylococcus species NOT DETECTED NOT DETECTED Final   Staphylococcus aureus NOT DETECTED NOT DETECTED Final   Streptococcus species NOT DETECTED NOT DETECTED Final   Streptococcus agalactiae NOT DETECTED NOT DETECTED Final   Streptococcus pneumoniae NOT DETECTED NOT DETECTED Final   Streptococcus pyogenes NOT DETECTED NOT DETECTED Final   Acinetobacter baumannii NOT DETECTED NOT DETECTED Final   Enterobacteriaceae species DETECTED (A) NOT DETECTED Final    Comment: Enterobacteriaceae represent a large family of gram-negative bacteria, not a single organism. CRITICAL RESULT CALLED TO, READ BACK BY AND VERIFIED WITH: J GADHIA 11/24/16 @ 3244 M VESTAL    Enterobacter cloacae complex NOT DETECTED NOT DETECTED Final   Escherichia coli DETECTED (A) NOT DETECTED Final    Comment:  CRITICAL RESULT  CALLED TO, READ BACK BY AND VERIFIED WITH: J GADHIA 11/24/16 @ 0931 M VESTAL    Klebsiella oxytoca NOT DETECTED NOT DETECTED Final   Klebsiella pneumoniae NOT DETECTED NOT DETECTED Final   Proteus species NOT DETECTED NOT DETECTED Final   Serratia marcescens NOT DETECTED NOT DETECTED Final   Carbapenem resistance NOT DETECTED NOT DETECTED Final   Haemophilus influenzae NOT DETECTED NOT DETECTED Final   Neisseria meningitidis NOT DETECTED NOT DETECTED Final   Pseudomonas aeruginosa NOT DETECTED NOT DETECTED Final   Candida albicans NOT DETECTED NOT DETECTED Final   Candida glabrata NOT DETECTED NOT DETECTED Final   Candida krusei NOT DETECTED NOT DETECTED Final   Candida parapsilosis NOT DETECTED NOT DETECTED Final   Candida tropicalis NOT DETECTED NOT DETECTED Final    Comment: Performed at [facility], [place] [address] [place], [place] [address]  Culture, blood (Routine X 2) w Reflex to ID Panel     Status: None   Collection Time: 11/23/16  5:01 PM  Result Value Ref Range Status   Specimen Description BLOOD LEFT HAND  Final   Special Requests IN PEDIATRIC BOTTLE Blood Culture adequate volume  Final   Culture   Final    NO GROWTH 5 DAYS Performed at [facility], [place] [address] [place], [place] [address]    Report Status 11/28/2016 FINAL  Final  Culture, blood (Routine X 2) w Reflex to ID Panel     Status: None (Preliminary result)   Collection Time: 11/25/16  2:42 PM  Result Value Ref Range Status   Specimen Description BLOOD LEFT ANTECUBITAL  Final   Special Requests IN PEDIATRIC BOTTLE Blood Culture adequate volume  Final   Culture   Final    NO GROWTH 3 DAYS Performed at [facility], [place] [address], [place], [place] [address]    Report Status PENDING  Incomplete  Culture, blood (Routine X 2) w Reflex to ID Panel     Status: None (Preliminary result)   Collection Time: 11/25/16  2:42 PM  Result Value Ref Range Status   Specimen Description BLOOD LEFT HAND   Final   Special Requests IN PEDIATRIC BOTTLE Blood Culture adequate volume  Final   Culture   Final    NO GROWTH 3 DAYS Performed at [facility], [place] [address], [place], [place] [address]    Report Status PENDING  Incomplete     Labs: BNP (last 3 results)  Recent Labs  09/17/16 2342  BNP 00.9   Basic Metabolic Panel:  Recent Labs Lab 11/23/16 0221 11/23/16 1701 11/24/16 0551 11/25/16 0609 11/26/16 0620 11/27/16 0747  NA 135  --  137 140 140 138  K 4.0  --  3.6 3.0* 3.4* 3.6  CL 98*  --  103 109 107 104  CO2 26  --  _0 GLUCOSE 135*  --  102* 107* 115* 125*  BUN 20  --  22* _1 CREATININE 0.85  --  0.76 0.63 0.60 0.59  CALCIUM 9.3  --  8.6* 8.2* 8.1* 8.4*  MG  --  2.0  --   --   --   --    Liver Function Tests:  Recent Labs Lab 11/24/16 0551 11/26/16 0620  AST 20 21  ALT 8* <5*  ALKPHOS 145* 106  BILITOT 0.7 0.5  PROT 6.4* 5.3*  ALBUMIN 2.8* 2.1*   CBC:  Recent Labs  Lab 11/23/16 0221 11/24/16 0551 11/25/16 0609 11/26/16 0620  WBC 24.0* 13.5* 8.3 6.6  NEUTROABS 20.2*  --   --   --   HGB 13.7 13.7 12.4 12.4  HCT 40.9 39.9 37.1 37.2  MCV 84.3 83.1 84.3 84.0  PLT 230 214 207 209   Cardiac Enzymes:  Recent Labs Lab 11/23/16 0447 11/23/16 1701  CKTOTAL 300* 200   BNP: Invalid input(s): POCBNP CBG:  Recent Labs Lab 11/26/16 2135  GLUCAP 155*   D-Dimer No results for input(s): DDIMER in the last 72 hours. Hgb A1c No results for input(s): HGBA1C in the last 72 hours. Lipid Profile No results for input(s): CHOL, HDL, LDLCALC, TRIG, CHOLHDL, LDLDIRECT in the last 72 hours. Thyroid function studies No results for input(s): TSH, T4TOTAL, T3FREE, THYROIDAB in the last 72 hours.  Invalid input(s): FREET3 Anemia work up No results for input(s): VITAMINB12, FOLATE, FERRITIN, TIBC, IRON, RETICCTPCT in the last 72 hours. Urinalysis    Component Value Date/Time   COLORURINE AMBER (A) 11/23/2016 0745   APPEARANCEUR  CLOUDY (A) 11/23/2016 0745   LABSPEC 1.011 11/23/2016 0745   PHURINE 6.0 11/23/2016 0745   GLUCOSEU NEGATIVE 11/23/2016 0745   HGBUR LARGE (A) 11/23/2016 0745   BILIRUBINUR NEGATIVE 11/23/2016 0745   BILIRUBINUR negative 06/27/2016 1222   KETONESUR 5 (A) 11/23/2016 0745   PROTEINUR 100 (A) 11/23/2016 0745   UROBILINOGEN negative (A) 06/27/2016 1222   UROBILINOGEN 0.2 09/07/2012 1049   NITRITE NEGATIVE 11/23/2016 0745   LEUKOCYTESUR LARGE (A) 11/23/2016 0745   Sepsis Labs Invalid input(s): PROCALCITONIN,  WBC,  LACTICIDVEN   Time coordinating discharge: 45 minutes  SIGNED:  Marzetta Board, MD  [facility] 11/28/2016, 4:56 PM Pager [phone]  If 7PM-7AM, please contact night-coverage [email or web address] Password [email or web address]

## 2016-11-28 NOTE — Progress Notes (Signed)
November 28, 2016 Chart and discharge orders researched for Case Management needs. IV abx for home [facility] with rn and pt order.  Text sent to [employer]. Patient and family have no further questions. Velva Harman, BSN, [place], [place].

## 2016-11-28 NOTE — Discharge Instructions (Signed)
Follow with [name] [name], [name] Filler, MD in 5-7 days  [facility] has your information and will review your chart following discharge. They will call you to schedule an appointment with a Urologist. Please call their clinic if you have not heard from them by Friday  Please get a complete blood count and chemistry panel checked by your Primary MD at your next visit, and again as instructed by your Primary MD. Please get your medications reviewed and adjusted by your Primary MD.  Please request your Primary MD to go over all Hospital Tests and Procedure/Radiological results at the follow up, please get all Hospital records sent to your Prim MD by signing hospital release before you go home.  If you had Pneumonia of Lung problems at the Hospital: Please get a 2 view Chest X ray done in 6-8 weeks after hospital discharge or sooner if instructed by your Primary MD.  If you have Congestive Heart Failure: Please call your Cardiologist or Primary MD anytime you have any of the following symptoms:  1) 3 pound weight gain in 24 hours or 5 pounds in 1 week  2) shortness of breath, with or without a dry hacking cough  3) swelling in the hands, feet or stomach  4) if you have to sleep on extra pillows at night in order to breathe  Follow cardiac low salt diet and 1.5 lit/day fluid restriction.  If you have diabetes Accuchecks 4 times/day, Once in AM empty stomach and then before each meal. Log in all results and show them to your primary doctor at your next visit. If any glucose reading is under 80 or above 300 call your primary MD immediately.  If you have Seizure/Convulsions/Epilepsy: Please do not drive, operate heavy machinery, participate in activities at heights or participate in high speed sports until you have seen by Primary MD or a Neurologist and advised to do so again.  If you had Gastrointestinal Bleeding: Please ask your Primary MD to check a complete blood count within one week of  discharge or at your next visit. Your endoscopic/colonoscopic biopsies that are pending at the time of discharge, will also need to followed by your Primary MD.  Get Medicines reviewed and adjusted. Please take all your medications with you for your next visit with your Primary MD  Please request your Primary MD to go over all hospital tests and procedure/radiological results at the follow up, please ask your Primary MD to get all Hospital records sent to his/her office.  If you experience worsening of your admission symptoms, develop shortness of breath, life threatening emergency, suicidal or homicidal thoughts you must seek medical attention immediately by calling 911 or calling your MD immediately  if symptoms less severe.  You must read complete instructions/literature along with all the possible adverse reactions/side effects for all the Medicines you take and that have been prescribed to you. Take any new Medicines after you have completely understood and accpet all the possible adverse reactions/side effects.   Do not drive or operate heavy machinery when taking Pain medications.   Do not take more than prescribed Pain, Sleep and Anxiety Medications  Special Instructions: If you have smoked or chewed Tobacco  in the last 2 yrs please stop smoking, stop any regular Alcohol  and or any Recreational drug use.  Wear Seat belts while driving.  Please note You were cared for by a hospitalist during your hospital stay. If you have any questions about your discharge medications or the care  you received while you were in the hospital after you are discharged, you can call the unit and asked to speak with the hospitalist on call if the hospitalist that took care of you is not available. Once you are discharged, your primary care physician will handle any further medical issues. Please note that NO REFILLS for any discharge medications will be authorized once you are discharged, as it is imperative  that you return to your primary care physician (or establish a relationship with a primary care physician if you do not have one) for your aftercare needs so that they can reassess your need for medications and monitor your lab values.  You can reach the hospitalist office at phone [phone] or fax [phone]   If you do not have a primary care physician, you can call [phone] for a physician referral.  Activity: As tolerated with Full fall precautions use walker/cane & assistance as needed  Diet: regular  Disposition Home

## 2016-11-28 NOTE — Progress Notes (Signed)
Pt discharged home to [place] in stable condition.  Transported via daughter.

## 2016-11-28 NOTE — Progress Notes (Signed)
Peripherally Inserted Central Catheter/Midline Placement  The IV Nurse has discussed with the patient and/or persons authorized to consent for the patient, the purpose of this procedure and the potential benefits and risks involved with this procedure.  The benefits include less needle sticks, lab draws from the catheter, and the patient may be discharged home with the catheter. Risks include, but not limited to, infection, bleeding, blood clot (thrombus formation), and puncture of an artery; nerve damage and irregular heartbeat and possibility to perform a PICC exchange if needed/ordered by physician.  Alternatives to this procedure were also discussed.  Bard Power PICC patient education guide, fact sheet on infection prevention and patient information card has been provided to patient /or left at bedside.    PICC/Midline Placement Documentation  PICC Single Lumen 43/15/40 PICC Right Basilic 38 cm 0 cm (Active)     PICC Single Lumen 08/67/61 PICC Right Basilic 40 cm 0 cm (Active)  Indication for Insertion or Continuance of Line Home intravenous therapies (PICC only) 11/28/2016  7:00 PM  Exposed Catheter (cm) 0 cm 11/28/2016  7:00 PM  Site Assessment Clean;Dry;Intact 11/28/2016  7:00 PM  Line Status Flushed;Saline locked;Blood return noted 11/28/2016  7:00 PM  Dressing Type Transparent;Securing device 11/28/2016  7:00 PM  Dressing Status Clean;Dry;Intact;Antimicrobial disc in place 11/28/2016  7:00 PM  Dressing Change Due 12/05/16 11/28/2016  7:00 PM       [name] [name], [name] [name] 11/28/2016, 7:08 PM

## 2016-11-28 NOTE — Progress Notes (Signed)
[place] for Warfarin Indication: history of PEs  Allergies  Allergen Reactions  . Crestor [Rosuvastatin Calcium] Other (See Comments)    Feeling very bad, aching all over.  . Erythromycin Hives  . Gabapentin Other (See Comments)    Blurred vision  . Pregabalin Other (See Comments)    Crossed vision.  . Carbidopa Other (See Comments)    Headaches, nausea, dizziness  . Macrobid [Nitrofurantoin] Hives  . Losartan Itching    Patient Measurements: Height: 5\' 7"  (170.2 cm) Weight: 210 lb 5.1 oz (95.4 kg) IBW/kg (Calculated) : 61.6   Vital Signs: Temp: 98.2 F (36.8 C) (09/25 0514) Temp Source: Oral (09/25 0514) BP: 157/75 (09/25 0514) Pulse Rate: 71 (09/25 0514)  Labs:  Recent Labs  11/26/16 0620 11/27/16 0747 11/28/16 0809  HGB 12.4  --   --   HCT 37.2  --   --   PLT 209  --   --   LABPROT 28.1* 31.2* 29.2*  INR 2.66 3.04 2.79  CREATININE 0.60 0.59  --     Estimated Creatinine Clearance: 74.3 mL/min (by C-G formula based on SCr of 0.59 mg/dL).   Assessment: 58 y/oF on warfarin prior to admission for hx of PEs admitted on 11/23/16 with sepsis secondary to UTI. Pharmacy to continue warfarin and start Lovenox for subtherapeutic INR   Baseline INR 1.59, subtherapeutic  Prior to admission anticoagulation: warfarin 4 mg daily, LD unk  Lovenox stopped 9/22  Today, 11/28/2016:  INR now returned to therapeutic  CBC: (9/23) slightly low but stable; Plt WNL  SCr: stable WNL  Drug-Drug interactions: none major, celecoxib may increase bleed risk  No bleeding issues documented  Diet advanced to regular, eating 50% of meals  Goal of Therapy:  INR 2-3 Monitor platelets by anticoagulation protocol: Yes   Plan:   Resume warfarin with 3 mg tonight  Recommend discharging on 3 or 3.5 mg daily given broad spectrum abx and reduced PO intake resulting in elevated INR; check INR in 3-5 days  Daily INR  Monitor closely for  s/sx of bleeding  Consider holding Celecoxib if clinically appropriate  Reuel Boom, PharmD, BCPS Pager: [phone] 11/28/2016, 11:09 AM

## 2016-11-28 NOTE — Progress Notes (Signed)
Patient given discharge instructions, and verbalized an understanding of all discharge instructions.  Patient agrees with discharge plan, and is being discharged in stable medical condition.  Patient given transportation via wheelchair.  Patient unwilling to wait for antibiotics to be delivered to room.  Daughter who is case Freight forwarder, to arrange for pick up tomorrow.  Patient also, not willing to wait for last dose of antibiotic, prior to discharge.

## 2016-11-28 NOTE — Progress Notes (Signed)
PHARMACY CONSULT NOTE FOR:  OUTPATIENT  PARENTERAL ANTIBIOTIC THERAPY (OPAT)  Indication: ESBL E. coli bacteremia Regimen: Ertapenem 1g IV q24 hr End date: 12/10/2016  IV antibiotic discharge orders are pended. To discharging provider:  please sign these orders via discharge navigator,  Select New Orders & click on the button choice - Manage This Unsigned Work.     Thank you for allowing pharmacy to be a part of this patient's care.  Reuel Boom, PharmD, BCPS Pager: [phone] 11/28/2016, 11:03 AM

## 2016-11-28 NOTE — Progress Notes (Signed)
[facility] pt for [facility] this hospital admission.  [facility] will provide [facility], Home PT and Home Infusion Pharm[facility] services at DC for home IV ABX.  [facility] will be prepared for DC home when ordered.  If patient discharges after hours, please call [phone].   Larry Sierras 11/28/2016, 12:09 PM

## 2016-11-29 DIAGNOSIS — R7881 Bacteremia: Secondary | ICD-10-CM | POA: Diagnosis not present

## 2016-11-29 DIAGNOSIS — B9620 Unspecified Escherichia coli [E. coli] as the cause of diseases classified elsewhere: Secondary | ICD-10-CM | POA: Diagnosis not present

## 2016-11-30 ENCOUNTER — Telehealth: Payer: Self-pay

## 2016-11-30 DIAGNOSIS — B9620 Unspecified Escherichia coli [E. coli] as the cause of diseases classified elsewhere: Secondary | ICD-10-CM | POA: Diagnosis not present

## 2016-11-30 DIAGNOSIS — R7881 Bacteremia: Secondary | ICD-10-CM | POA: Diagnosis not present

## 2016-11-30 LAB — CULTURE, BLOOD (ROUTINE X 2)
CULTURE: NO GROWTH
CULTURE: NO GROWTH
Special Requests: ADEQUATE
Special Requests: ADEQUATE

## 2016-11-30 NOTE — Telephone Encounter (Signed)
11/30/16   Transition Care Management Follow-up Telephone Call  ADMISSION DATE: 11/23/16  DISCHARGE DATE: 11/28/16   How have you been since you were released from the hospital? Doing better just balance still not good.    Do you understand why you were in the hospital? Yes   Do you understand the discharge instrcutions? Yes    Items Reviewed:  Medications reviewed: Yes   Allergies reviewed: Yes   Dietary changes reviewed: Regular diet   Referrals reviewed: Scheduled with [name] for Hospital Follow Up. Appointment has been scheduled with [facility].   Functional Questionnaire:   Activities of Daily Living (ADLs):  Patient states she can perform all ADL'S  Any patient concerns? Patient would like to know when she'll be well.    Confirmed importance and date/time of follow-up visits scheduled:Yes   Confirmed with patient if condition begins to worsen call PCP or go to the ER.     Patient was given the office number and encouragred to call back with questions or concerns. Yes

## 2016-12-01 ENCOUNTER — Telehealth: Payer: Self-pay | Admitting: *Deleted

## 2016-12-01 DIAGNOSIS — N390 Urinary tract infection, site not specified: Secondary | ICD-10-CM | POA: Diagnosis not present

## 2016-12-01 DIAGNOSIS — A4151 Sepsis due to Escherichia coli [E. coli]: Secondary | ICD-10-CM | POA: Diagnosis not present

## 2016-12-01 LAB — CBC AND DIFFERENTIAL
HCT: 37 (ref 36–46)
HEMATOCRIT: 37 (ref 36–46)
HEMOGLOBIN: 12.3 (ref 12.0–16.0)
Hemoglobin: 12.3 (ref 12.0–16.0)
PLATELETS: 394 (ref 150–399)
PLATELETS: 394 (ref 150–399)
WBC: 8.9

## 2016-12-01 LAB — BASIC METABOLIC PANEL
BUN: 10 (ref 4–21)
CREATININE: 0.6 (ref 0.5–1.1)
GLUCOSE: 106
Potassium: 3.8 (ref 3.4–5.3)
Potassium: 3.8 (ref 3.4–5.3)
Sodium: 145 (ref 137–147)

## 2016-12-01 NOTE — Telephone Encounter (Signed)
Received Physician Orders Resumption of Care from [facility]; forwarded to provider/SLS 09/28

## 2016-12-04 DIAGNOSIS — N390 Urinary tract infection, site not specified: Secondary | ICD-10-CM | POA: Diagnosis not present

## 2016-12-04 DIAGNOSIS — A4151 Sepsis due to Escherichia coli [E. coli]: Secondary | ICD-10-CM | POA: Diagnosis not present

## 2016-12-05 DIAGNOSIS — N390 Urinary tract infection, site not specified: Secondary | ICD-10-CM | POA: Diagnosis not present

## 2016-12-05 DIAGNOSIS — A4151 Sepsis due to Escherichia coli [E. coli]: Secondary | ICD-10-CM | POA: Diagnosis not present

## 2016-12-06 DIAGNOSIS — N390 Urinary tract infection, site not specified: Secondary | ICD-10-CM | POA: Diagnosis not present

## 2016-12-06 DIAGNOSIS — A4151 Sepsis due to Escherichia coli [E. coli]: Secondary | ICD-10-CM | POA: Diagnosis not present

## 2016-12-07 ENCOUNTER — Emergency Department (HOSPITAL_BASED_OUTPATIENT_CLINIC_OR_DEPARTMENT_OTHER): Payer: Medicare Other

## 2016-12-07 ENCOUNTER — Emergency Department (HOSPITAL_BASED_OUTPATIENT_CLINIC_OR_DEPARTMENT_OTHER)
Admission: EM | Admit: 2016-12-07 | Discharge: 2016-12-07 | Disposition: A | Discharge status: Home / Self Care | Payer: Medicare Other | Attending: Emergency Medicine | Admitting: Emergency Medicine

## 2016-12-07 ENCOUNTER — Encounter (HOSPITAL_BASED_OUTPATIENT_CLINIC_OR_DEPARTMENT_OTHER): Payer: Self-pay | Admitting: *Deleted

## 2016-12-07 DIAGNOSIS — Z96651 Presence of right artificial knee joint: Secondary | ICD-10-CM | POA: Insufficient documentation

## 2016-12-07 DIAGNOSIS — E039 Hypothyroidism, unspecified: Secondary | ICD-10-CM | POA: Diagnosis not present

## 2016-12-07 DIAGNOSIS — Y999 Unspecified external cause status: Secondary | ICD-10-CM | POA: Insufficient documentation

## 2016-12-07 DIAGNOSIS — N390 Urinary tract infection, site not specified: Secondary | ICD-10-CM | POA: Diagnosis not present

## 2016-12-07 DIAGNOSIS — R2689 Other abnormalities of gait and mobility: Secondary | ICD-10-CM | POA: Diagnosis not present

## 2016-12-07 DIAGNOSIS — R51 Headache: Secondary | ICD-10-CM | POA: Insufficient documentation

## 2016-12-07 DIAGNOSIS — Z87891 Personal history of nicotine dependence: Secondary | ICD-10-CM | POA: Insufficient documentation

## 2016-12-07 DIAGNOSIS — W1789XA Other fall from one level to another, initial encounter: Secondary | ICD-10-CM | POA: Diagnosis not present

## 2016-12-07 DIAGNOSIS — Y939 Activity, unspecified: Secondary | ICD-10-CM | POA: Insufficient documentation

## 2016-12-07 DIAGNOSIS — G20 Parkinson's disease: Secondary | ICD-10-CM | POA: Diagnosis not present

## 2016-12-07 DIAGNOSIS — Z7901 Long term (current) use of anticoagulants: Secondary | ICD-10-CM | POA: Insufficient documentation

## 2016-12-07 DIAGNOSIS — Y929 Unspecified place or not applicable: Secondary | ICD-10-CM | POA: Insufficient documentation

## 2016-12-07 DIAGNOSIS — Z79899 Other long term (current) drug therapy: Secondary | ICD-10-CM | POA: Insufficient documentation

## 2016-12-07 DIAGNOSIS — M542 Cervicalgia: Secondary | ICD-10-CM | POA: Diagnosis not present

## 2016-12-07 DIAGNOSIS — I10 Essential (primary) hypertension: Secondary | ICD-10-CM | POA: Insufficient documentation

## 2016-12-07 DIAGNOSIS — M25552 Pain in left hip: Secondary | ICD-10-CM

## 2016-12-07 DIAGNOSIS — S098XXA Other specified injuries of head, initial encounter: Secondary | ICD-10-CM | POA: Diagnosis not present

## 2016-12-07 DIAGNOSIS — M25551 Pain in right hip: Secondary | ICD-10-CM | POA: Insufficient documentation

## 2016-12-07 DIAGNOSIS — G4489 Other headache syndrome: Secondary | ICD-10-CM | POA: Diagnosis not present

## 2016-12-07 DIAGNOSIS — A4151 Sepsis due to Escherichia coli [E. coli]: Secondary | ICD-10-CM | POA: Diagnosis not present

## 2016-12-07 DIAGNOSIS — S199XXA Unspecified injury of neck, initial encounter: Secondary | ICD-10-CM | POA: Diagnosis not present

## 2016-12-07 DIAGNOSIS — W19XXXA Unspecified fall, initial encounter: Secondary | ICD-10-CM

## 2016-12-07 DIAGNOSIS — G8911 Acute pain due to trauma: Secondary | ICD-10-CM | POA: Diagnosis not present

## 2016-12-07 DIAGNOSIS — R791 Abnormal coagulation profile: Secondary | ICD-10-CM | POA: Insufficient documentation

## 2016-12-07 DIAGNOSIS — S0990XA Unspecified injury of head, initial encounter: Secondary | ICD-10-CM | POA: Diagnosis not present

## 2016-12-07 DIAGNOSIS — M545 Low back pain: Secondary | ICD-10-CM | POA: Diagnosis not present

## 2016-12-07 DIAGNOSIS — S79912A Unspecified injury of left hip, initial encounter: Secondary | ICD-10-CM | POA: Diagnosis not present

## 2016-12-07 LAB — BASIC METABOLIC PANEL
Anion gap: 6 (ref 5–15)
BUN: 10 mg/dL (ref 6–20)
CALCIUM: 9.1 mg/dL (ref 8.9–10.3)
CHLORIDE: 103 mmol/L (ref 101–111)
CO2: 29 mmol/L (ref 22–32)
Creatinine, Ser: 0.72 mg/dL (ref 0.44–1.00)
GFR calc Af Amer: >60 mL/min (ref >60)
GLUCOSE: 150 mg/dL — AB (ref 65–99)
POTASSIUM: 3.6 mmol/L (ref 3.5–5.1)
Sodium: 138 mmol/L (ref 135–145)

## 2016-12-07 LAB — CBC
HEMATOCRIT: 41.3 % (ref 36.0–46.0)
HEMOGLOBIN: 13.3 g/dL (ref 12.0–15.0)
MCH: 27.7 pg (ref 26.0–34.0)
MCHC: 32.2 g/dL (ref 30.0–36.0)
MCV: 86 fL (ref 78.0–100.0)
Platelets: 445 10*3/uL — ABNORMAL HIGH (ref 150–400)
RBC: 4.8 MIL/uL (ref 3.87–5.11)
RDW: 13.8 % (ref 11.5–15.5)
WBC: 9.3 10*3/uL (ref 4.0–10.5)

## 2016-12-07 LAB — PROTIME-INR
INR: 3.6
Prothrombin Time: 35.6 seconds — ABNORMAL HIGH (ref 11.4–15.2)

## 2016-12-07 MED ORDER — HYDROCODONE-ACETAMINOPHEN 5-325 MG PO TABS
1.0000 | ORAL_TABLET | Freq: Once | ORAL | Status: AC
  Administered 2016-12-07: 1 via ORAL
  Filled 2016-12-07: qty 1

## 2016-12-07 MED ORDER — ACETAMINOPHEN 325 MG PO TABS
650.0000 mg | ORAL_TABLET | Freq: Once | ORAL | Status: AC
  Administered 2016-12-07: 650 mg via ORAL
  Filled 2016-12-07: qty 2

## 2016-12-07 NOTE — Discharge Instructions (Signed)
Your INR today was 3.6  Please hold your next dose of Warfarin, then resume  Please speak with your coumadin team about you elevated INR today

## 2016-12-07 NOTE — ED Notes (Signed)
Returned from xray

## 2016-12-07 NOTE — ED Provider Notes (Signed)
[facility] DEPT [facility] Provider Note   CSN: [id number] Arrival date & time: 12/07/16  0213     History   Chief Complaint Chief Complaint  Patient presents with  . Fall  . Hip Pain    HPI [name] [name] is a 71 y.o. female.  HPI Patient's a 71 year old female presents to [name] from her residence where she fell backwards while in the kitchen making a sandwich.  She states that she struck her head.  She reports head and neck pain.  She also reports pain in her low back and her bilateral hips left greater than right.  She denies loss consciousness.  Mild headache at this time.  Denies weakness or numbness of her upper lower extremities.  Denies chest pain.  No shortness breath.  Denies abdominal pain.  Symptoms are mild to moderate in severity.  She does report a headache now.  She is on Coumadin.  She has a history of Parkinson's disease and walks with a walker.  She states it is somewhat of a frequent occurrence where she falls backwards.   Past Medical History:  Diagnosis Date  . Arthritis   . Chronic insomnia   . Complication of anesthesia    severe itching night of surgery requiring meds- also couldnt swallow- throat "paralyzed""  . Fibromyalgia   . Glaucoma   . Hypercholesterolemia   . Hyperlipidemia   . Hypertension    PCP Dr Cari Caraway- [facility]  05/15/11 with clearance and note on chart,    chest x ray, EKG 12/12 [facility], eccho 7/11 [facility]  . Hypothyroidism    s/p graves disease  . Kidney stone   . PD (Parkinson's disease) ([place])   . Peripheral vascular disease ([place])    PULMONARY EMBOLUS x 2- 2011, 2012/ FOLLOWED BY DR [id number]-[id number] 12/12 [facility]   PT STAES WILL STOP COUMADIN 3/12 and begin LOVONOX 05/17/11 as previously instructed  . Sinus infection    at present- dr Honor Loh PA aware- was seen there and at PCP 05/10/11  . Thyroid disease    Hypothyroidism    Patient Active Problem List   Diagnosis Date Noted  . Recurrent UTI 11/23/2016  . Fall at nursing home 11/23/2016  . PD  (Parkinson's disease) ([place]) 11/23/2016  . Sepsis due to urinary tract infection ([place]) 11/23/2016  . Fibromyalgia 11/23/2016  . Hypercholesterolemia 11/23/2016  . Hypertension 11/23/2016  . Hypothyroidism, adult 11/23/2016  . Elevated CK 11/23/2016  . Pulmonary HTN ([place]) 11/23/2016  . Left ventricular diastolic dysfunction 53/66/4403  . Metabolic encephalopathy 47/42/5956  . History of pulmonary embolus (PE) 2/2 hypercoagulable state 11/23/2016  . Sepsis ([place]) 09/17/2016  . Acute metabolic encephalopathy 38/75/6433  . Cellulitis of right foot 09/17/2016  . Fall   . UTI (urinary tract infection) 04/29/2016  . SIRS (systemic inflammatory response syndrome) ([place]) 04/29/2016  . Hyperlipidemia 04/29/2016  . Hypothyroidism 04/29/2016  . Hematuria 04/05/2016  . Cough 04/05/2016  . Benign essential HTN   . Intractable pain 12/17/2015  . Displaced fracture of fifth cervical vertebra ([place]) 12/17/2015  . Gait disturbance 12/17/2015  . Trochanteric bursitis of both hips 09/13/2015  . Chronic low back pain 06/07/2015  . Fibromyalgia syndrome 05/17/2015  . Thyroid disease 05/17/2015  . Parkinson's disease ([place]) 05/17/2015  . Hypercoagulable state ([place]) 02/07/2012  . History of pulmonary embolism 02/07/2012  . S/P right TKA 05/22/2011    Past Surgical History:  Procedure Laterality Date  . ABDOMINAL HYSTERECTOMY    . APPENDECTOMY    . CATARACT  EXTRACTION Bilateral   . CHOLECYSTECTOMY    . COLONOSCOPY    . EXPLORATORY LAPAROTOMY    . JOINT REPLACEMENT     left knee  7/12  . TOTAL KNEE ARTHROPLASTY  05/22/2011   Procedure: TOTAL KNEE ARTHROPLASTY;  Surgeon: Shelda Pal, MD;  Location: [facility];  Service: Orthopedics;  Laterality: Right;    OB History    No data available       Home Medications    Prior to Admission medications   Medication Sig Start Date End Date Taking? Authorizing Provider  acetaminophen (TYLENOL) 500 MG tablet Take 500 mg by mouth every 6 (six) hours as  needed for mild pain.    [provider]  albuterol (PROVENTIL HFA;VENTOLIN HFA) 108 (90 Base) MCG/ACT inhaler Inhale 2 puffs into the lungs every 6 (six) hours as needed for wheezing or shortness of breath. 04/07/16   Myrlene Broker, MD  atorvastatin (LIPITOR) 10 MG tablet Take 1 tablet (10 mg total) by mouth daily. 08/16/16   [name], Gwenlyn Found, MD  Carbidopa-Levodopa ER (RYTARY) 23.75-95 MG CPCR Take 1 tablet by mouth 3 (three) times daily. 09/07/16   Tat, Octaviano Batty, DO  celecoxib (CELEBREX) 100 MG capsule Take 1 capsule (100 mg total) by mouth 2 (two) times daily. 07/09/16   [name], Gwenlyn Found, MD  cetirizine (ZYRTEC) 10 MG tablet Take 1 tablet (10 mg total) by mouth daily. 04/17/16   Sharlene Dory, DO  Cholecalciferol (VITAMIN D3) 5000 UNITS CAPS Take 5,000 Units by mouth daily.     [provider]  cyanocobalamin 500 MCG tablet Take 500 mcg by mouth daily.    [provider]  cycloSPORINE (RESTASIS) 0.05 % ophthalmic emulsion Place 1 drop into both eyes 2 (two) times daily.    [provider]  DULoxetine (CYMBALTA) 60 MG capsule Take 1 capsule (60 mg total) by mouth daily. 08/31/16   [name], Gwenlyn Found, MD  ertapenem [facility]) IVPB Inject 1 g into the vein daily. Indication:  ESBL bacteremia Last Day of Therapy:  12/10/16 Labs - Once weekly:  CBC/D and BMP, Labs - Every other week:  ESR and CRP 11/28/16 12/10/16  Gherghe, Daylene Katayama, MD  fluticasone (FLONASE) 50 MCG/ACT nasal spray Place 1 spray into both nostrils daily.    [provider]  HYDROcodone-acetaminophen (NORCO/VICODIN) 5-325 MG tablet Take 1 tablet by mouth every 6 (six) hours as needed for moderate pain or severe pain. 11/28/16   Leatha Gilding, MD  magnesium oxide (MAG-OX) 400 (241.3 Mg) MG tablet Take 0.5 tablets (200 mg total) by mouth daily. 12/21/15   Alison Murray, MD  Melatonin 5 MG TABS Take 1 tablet by mouth at bedtime as needed (for sleep).    [provider]    Menthol, Topical Analgesic, (BIOFREEZE EX) Apply 1 application topically daily as needed (for pain).    [provider]  methocarbamol (ROBAXIN) 500 MG tablet Take 500 mg by mouth every 6 (six) hours as needed for muscle spasms.    [provider]  nicotine polacrilex (NICORETTE) 2 MG gum Take 2 mg by mouth as needed for smoking cessation.    [provider]  Omega-3 Fatty Acids (FISH OIL PO) Take 1 capsule by mouth daily.    [provider]  ondansetron (ZOFRAN ODT) 4 MG disintegrating tablet Take 1 tablet (4 mg total) by mouth every 8 (eight) hours as needed for nausea or vomiting. 06/27/16   Saguier, Ramon Dredge, PA-C  Oxycodone  HCl 10 MG TABS Take 1/2 tablet daily as needed for neck pain. If pain persists may take another 1/2 tablet per day Patient taking differently: Take 5 mg by mouth See admin instructions. Take 5 mg daily as needed for neck pain. If pain persists may take another 5 mg per day 05/03/16   Maretta Bees, MD  SYNTHROID 150 MCG tablet Take 1 tablet (150 mcg total) by mouth daily before breakfast. 07/09/16   [name], Gwenlyn Found, MD  telmisartan-hydrochlorothiazide (MICARDIS HCT) 40-12.5 MG tablet Take 1 tablet by mouth daily. 07/09/16   [name], Gwenlyn Found, MD  warfarin (COUMADIN) 4 MG tablet Take 1 tablet (4 mg total) by mouth See admin instructions. Adjust as directed by MD Patient taking differently: Take 4 mg by mouth daily. Adjust as directed by MD 10/09/16   [name], Gwenlyn Found, MD  zolpidem (AMBIEN) 5 MG tablet TAKE 1 TABLET BY MOUTH ONCE DAILY AT BEDTIME AS NEEDED FOR SLEEP 11/07/16   [name], Gwenlyn Found, MD    Family History Family History  Problem Relation Age of Onset  . Prostate cancer Father   . Stroke Father     Social History Social History  Substance Use Topics  . Smoking status: Former Smoker    Packs/day: 1.00    Quit date: 05/10/1989  . Smokeless tobacco: Never Used  . Alcohol use 0.0 oz/week     Comment: one drink once a month      Allergies   Crestor [rosuvastatin calcium]; Erythromycin; Gabapentin; Pregabalin; Carbidopa; Macrobid [nitrofurantoin]; and Losartan   Review of Systems Review of Systems  All other systems reviewed and are negative.    Physical Exam Updated Vital Signs BP 135/64 (BP Location: Left Arm)   Pulse 92   Temp 98 F (36.7 C) (Oral)   Resp 18   Ht 5\' 6"  (1.676 m)   Wt 88.5 kg (195 lb)   SpO2 95%   BMI 31.47 kg/m   Physical Exam  Constitutional: She is oriented to person, place, and time. She appears well-developed and well-nourished. No distress.  HENT:  Head: Normocephalic and atraumatic.  Eyes: EOM are normal.  Neck: Normal range of motion. Neck supple.  Mild cervical and paracervical tenderness without cervical step-off.  Cardiovascular: Normal rate, regular rhythm and normal heart sounds.   Pulmonary/Chest: Effort normal and breath sounds normal.  Abdominal: Soft. She exhibits no distension. There is no tenderness.  Musculoskeletal: Normal range of motion.  Full range of motion of bilateral shoulders, elbows and wrists. Full range of motion of bilateral hips, knees and ankles.  Mild lumbar tenderness.  Neurological: She is alert and oriented to person, place, and time.  Skin: Skin is warm and dry.  Psychiatric: She has a normal mood and affect. Judgment normal.  Nursing note and vitals reviewed.    ED Treatments / Results  Labs (all labs ordered are listed, but only abnormal results are displayed) Labs Reviewed  PROTIME-INR - Abnormal; Notable for the following:       Result Value   Prothrombin Time 35.6 (*)    All other components within normal limits  CBC - Abnormal; Notable for the following:    Platelets 445 (*)    All other components within normal limits  BASIC METABOLIC PANEL - Abnormal; Notable for the following:    Glucose, Bld 150 (*)    All other components within normal limits    EKG  EKG Interpretation None       Radiology Dg Lumbar  Spine Complete  Result Date: 12/07/2016 CLINICAL DATA:  Low back pain after a fall. EXAM: LUMBAR SPINE - COMPLETE 4+ VIEW COMPARISON:  09/09/2015 FINDINGS: Diffuse bone demineralization. Slight anterior subluxation of L4 on L5. This is unchanged since previous study. Otherwise normal alignment. No vertebral compression deformities. No focal bone lesion or bone destruction. Bone cortex appears intact. Degenerative changes with disc space narrowing and endplate hypertrophic changes throughout. Degenerative changes in the facet joints. Vascular calcifications. IMPRESSION: Diffuse bone demineralization and degenerative change. No acute displaced fractures identified. Aortic atherosclerosis. Electronically Signed   By: Lucienne Capers M.D.   On: 12/07/2016 03:40   Ct Head Wo Contrast  Result Date: 12/07/2016 CLINICAL DATA:  Fell 1.5 hours ago. Left-sided headache. Chronic neck pain. Patient is anticoagulated. EXAM: CT HEAD WITHOUT CONTRAST CT CERVICAL SPINE WITHOUT CONTRAST TECHNIQUE: Multidetector CT imaging of the head and cervical spine was performed following the standard protocol without intravenous contrast. Multiplanar CT image reconstructions of the cervical spine were also generated. COMPARISON:  CT head 11/23/2016, CT head and cervical spine 04/29/2016 FINDINGS: CT HEAD FINDINGS Brain: Diffuse cerebral atrophy. Ventricular dilatation consistent with central atrophy. Low-attenuation changes in the deep white matter consistent with small vessel ischemia. Cavum septum pellucidum. No mass effect or midline shift. No abnormal extra-axial fluid collections. Gray-white matter junctions are distinct. Basal cisterns are not effaced. No acute intracranial hemorrhage. Vascular: No hyperdense vessel or unexpected calcification. Skull: Calvarium appears intact. Sinuses/Orbits: Paranasal sinuses and mastoid air cells are not opacified. Other: None. CT CERVICAL SPINE FINDINGS Alignment: Normal. Skull base and  vertebrae: Skull base appears intact. Chronic anterior compression of C5 without change since previous study. No acute vertebral compression deformities identified. No focal bone lesion or bone destruction. Coalition of the posterior elements in the upper cervical spine on the left. This may be congenital or degenerative. Soft tissues and spinal canal: No prevertebral soft tissue swelling. No paraspinal mass or infiltration. Disc levels: Degenerative changes in the cervical spine with narrowed interspaces and endplate hypertrophic changes. Degenerative changes are most prominent at C5-6 and C6-7 levels. Degenerative changes in the facet joints. C1-2 articulation appears intact. Upper chest: Lung apices are clear. Other: None. IMPRESSION: 1. No acute intracranial abnormalities. Chronic atrophy and small vessel ischemic changes. 2. Normal alignment of the cervical spine. Old compression deformity of C5. Degenerative changes. No acute displaced fractures identified. Electronically Signed   By: Lucienne Capers M.D.   On: 12/07/2016 03:31   Ct Cervical Spine Wo Contrast  Result Date: 12/07/2016 CLINICAL DATA:  Fell 1.5 hours ago. Left-sided headache. Chronic neck pain. Patient is anticoagulated. EXAM: CT HEAD WITHOUT CONTRAST CT CERVICAL SPINE WITHOUT CONTRAST TECHNIQUE: Multidetector CT imaging of the head and cervical spine was performed following the standard protocol without intravenous contrast. Multiplanar CT image reconstructions of the cervical spine were also generated. COMPARISON:  CT head 11/23/2016, CT head and cervical spine 04/29/2016 FINDINGS: CT HEAD FINDINGS Brain: Diffuse cerebral atrophy. Ventricular dilatation consistent with central atrophy. Low-attenuation changes in the deep white matter consistent with small vessel ischemia. Cavum septum pellucidum. No mass effect or midline shift. No abnormal extra-axial fluid collections. Gray-white matter junctions are distinct. Basal cisterns are not  effaced. No acute intracranial hemorrhage. Vascular: No hyperdense vessel or unexpected calcification. Skull: Calvarium appears intact. Sinuses/Orbits: Paranasal sinuses and mastoid air cells are not opacified. Other: None. CT CERVICAL SPINE FINDINGS Alignment: Normal. Skull base and vertebrae: Skull base appears intact. Chronic anterior compression of C5 without change since previous study.  No acute vertebral compression deformities identified. No focal bone lesion or bone destruction. Coalition of the posterior elements in the upper cervical spine on the left. This may be congenital or degenerative. Soft tissues and spinal canal: No prevertebral soft tissue swelling. No paraspinal mass or infiltration. Disc levels: Degenerative changes in the cervical spine with narrowed interspaces and endplate hypertrophic changes. Degenerative changes are most prominent at C5-6 and C6-7 levels. Degenerative changes in the facet joints. C1-2 articulation appears intact. Upper chest: Lung apices are clear. Other: None. IMPRESSION: 1. No acute intracranial abnormalities. Chronic atrophy and small vessel ischemic changes. 2. Normal alignment of the cervical spine. Old compression deformity of C5. Degenerative changes. No acute displaced fractures identified. Electronically Signed   By: Lucienne Capers M.D.   On: 12/07/2016 03:31   Dg Hip Unilat W Or Wo Pelvis 2-3 Views Left  Result Date: 12/07/2016 CLINICAL DATA:  Fell 1.5 hours ago.  Bilateral hip pain. EXAM: DG HIP (WITH OR WITHOUT PELVIS) 2-3V LEFT COMPARISON:  04/29/2016 FINDINGS: Degenerative changes in the lower lumbar spine and in both hips. No evidence of acute fracture or dislocation involving the pelvis or the left hip. No focal bone lesion or bone destruction. SI joints and symphysis pubis are not displaced. Visualized sacrum appears intact. Vascular calcifications. IMPRESSION: No acute bony abnormalities.  Degenerative changes in the left hip. Electronically Signed    By: Lucienne Capers M.D.   On: 12/07/2016 03:39    Procedures Procedures (including critical care time)  Medications Ordered in ED Medications  HYDROcodone-acetaminophen (NORCO/VICODIN) 5-325 MG per tablet 1 tablet (1 tablet Oral Given 12/07/16 0238)  acetaminophen (TYLENOL) tablet 650 mg (650 mg Oral Given 12/07/16 0238)     Initial Impression / Assessment and Plan / ED Course  I have reviewed the triage vital signs and the nursing notes.  Pertinent labs & imaging results that were available during my care of the patient were reviewed by me and considered in my medical decision making (see chart for details).   overall well-appearing.  Medication imaging without abnormality.  Patient is aware of her elevated INR 3.6.  I've told her to withhold her next dose of Coumadin.  She'll follow-up with her anticoagulation team.  Overall well-appearing.  Pain treated.  Chest and abdomen benign.  Final Clinical Impressions(s) / ED Diagnoses   Final diagnoses:  Fall, initial encounter  Acute hip pain, left  Acute neck pain  Supratherapeutic INR    New Prescriptions New Prescriptions   No medications on file     Jola Schmidt, MD 12/07/16 [phone]

## 2016-12-07 NOTE — ED Notes (Signed)
Contacted [facility] for transport for patient back to her residence Youth worker)

## 2016-12-07 NOTE — ED Triage Notes (Signed)
Pt reports a fall this am from a standing position onto a carpeted floor presents with HA and bilat hip pain left worse than right. Denies LOC alert and oriented MAE

## 2016-12-08 DIAGNOSIS — N390 Urinary tract infection, site not specified: Secondary | ICD-10-CM | POA: Diagnosis not present

## 2016-12-08 DIAGNOSIS — A4151 Sepsis due to Escherichia coli [E. coli]: Secondary | ICD-10-CM | POA: Diagnosis not present

## 2016-12-10 ENCOUNTER — Telehealth: Payer: Self-pay | Admitting: Family Medicine

## 2016-12-10 NOTE — Telephone Encounter (Signed)
Received long list of potential medication interactions from [facility].  We have tried to get her off Ambien but other meds have not worked for her chronic insomnia and her insurnace does not cover Belsomra, rozerem did not work.  Have her on 5 mg of ambien.  Have advised her in person to not combine this with any other sedating medication

## 2016-12-11 ENCOUNTER — Telehealth: Payer: Self-pay | Admitting: *Deleted

## 2016-12-11 ENCOUNTER — Telehealth: Payer: Self-pay | Admitting: Family Medicine

## 2016-12-11 DIAGNOSIS — A4151 Sepsis due to Escherichia coli [E. coli]: Secondary | ICD-10-CM | POA: Diagnosis not present

## 2016-12-11 DIAGNOSIS — N390 Urinary tract infection, site not specified: Secondary | ICD-10-CM | POA: Diagnosis not present

## 2016-12-11 NOTE — Telephone Encounter (Signed)
Received Physician Orders [x2] from [facility]; forwarded to provider/SLS 10/08

## 2016-12-11 NOTE — Telephone Encounter (Signed)
Caller name: Pricilla Holm Relation to pt: RN from [employer] back number: [phone]    Reason for call:  Requesting verbal orders to remove pic line due to antibiotics being completed, please advise

## 2016-12-11 NOTE — Telephone Encounter (Signed)
Received Physician Orders [x2] from [facility]; forwarded to provider/SLS 10/08

## 2016-12-11 NOTE — Telephone Encounter (Signed)
[name] [name] is calling back still need verbal order to remove pic line

## 2016-12-11 NOTE — Telephone Encounter (Signed)
Per Dr. Sanda Klein Line Can Be Removed. Informed JoAnn with [facility].

## 2016-12-12 ENCOUNTER — Ambulatory Visit: Payer: Medicare Other | Admitting: Neurology

## 2016-12-12 DIAGNOSIS — A4151 Sepsis due to Escherichia coli [E. coli]: Secondary | ICD-10-CM | POA: Diagnosis not present

## 2016-12-12 DIAGNOSIS — N390 Urinary tract infection, site not specified: Secondary | ICD-10-CM | POA: Diagnosis not present

## 2016-12-12 NOTE — Progress Notes (Addendum)
[facility] [address] [place], [place] [address] [phone]  Date:  12/13/2016   Name:  [name] [name]   DOB:  08-30-1945   MRN:  [id number]  PCP:  Darreld Mclean, MD    Chief Complaint: Hospitalization Follow-up (Pt here for hosp f/u visit. )   History of Present Illness:  [name] [name] is a 71 y.o. very pleasant female patient who presents with the following:  Here today for an ER follow-up She fell and was seen in the ED on 10/4, as follows:  Patient's a 71 year old female presents to [name] from her residence where she fell backwards while in the kitchen making a sandwich.  She states that she struck her head.  She reports head and neck pain.  She also reports pain in her low back and her bilateral hips left greater than right.  She denies loss consciousness.  Mild headache at this time.  Denies weakness or numbness of her upper lower extremities.  Denies chest pain.  No shortness breath.  Denies abdominal pain.  Symptoms are mild to moderate in severity.  She does report a headache now.  She is on Coumadin.  She has a history of Parkinson's disease and walks with a walker.  She states it is somewhat of a frequent occurrence where she falls backwards.   However she was also admitted last month with urosepsis:  Admit date: 11/23/2016 Discharge date: 11/28/2016  Admitted From: ILF Disposition:  ILF  Recommendations for Outpatient Follow-up:  1. Follow up with PCP in 1-2 weeks 2. Continue Invaz for 12 additional days 3. Referral has been made to [facility], patient will be contacted by clinic per triage [place]: PT, RN Equipment/Devices: none  Discharge Condition: stable CODE STATUS: DNR Diet recommendation: regular  HPI: Per [name] [name], [name] [name] a 71 y.o.femalewith medical history significant for Parkinson's disease with fibromyalgia resident of independent living,  hypertension with associated left ventricular diastolic dysfunction and mild pulmonary hypertension, dyslipidemia, chronic insomnia, peripheral vascular disease, history of renal calculi, and recurrent UTI ESBL positive with similar bacteremia admissions. According to the chart and family patient has been having gait disturbance worse than baseline with falls and progressive altered mentation for several days which is typical of her presentation with UTI. She fell last night around 11 PM and utilized her Life Alert to summon help. EMS was called and she was transported to the hospital. Upon arrival she was afebrile and hemodynamically stable. She was found to have a significant leukocytosis, elevated CK and appeared clinically dehydrated. While holding in the ER around 7 AM she was found have a rectal temperature 101.1. Subsequent urinalysis was consistent with UTI. Patient has been given an empiric dose of Rocephin IV.  Hospital Course: Discharge Diagnoses:  Principal Problem:   Recurrent UTI Active Problems:   Sepsis ([place])   Fall at nursing home   PD (Parkinson's disease) ([place])   Sepsis due to urinary tract infection ([place])   Fibromyalgia   Hypercholesterolemia   Hypertension   Hypothyroidism, adult   Elevated CK   Pulmonary HTN ([facility])   Left ventricular diastolic dysfunction   Metabolic encephalopathy   History of pulmonary embolus (PE) 2/2 hypercoagulable state   Sepsis due to ESBL E coli UTI with ESBL E. coli bacteremia -Patient met sepsis criteria on admission with fever, leukocytosis, tachycardia and a source.  Started on meropenem empirically given history of ESBL infections,  and she improved.  Urine as well as blood cultures did in fact speciate ESBL E. Coli.  She has surveillance cultures done which have remained negative.  PICC line was placed on 9/25 and patient was discharged home with 12 additional days of antibiotics to complete a 14 day course since negative culture set.   Patient wished to discuss with urology regarding her recurrent urinary tract infections, I have called [facility] and provided with the patient information and they will contact patient following discharge to schedule a follow-up appointment Acute encephalopathy -Metabolic, due to UTI, resolved and she is back to baseline Parkinson's disease -Continue Sinemet Hypertension/pulmonary hypertension/LV diastolic dysfunction -resume home medications on discharge Hypothyroidism -Continue Synthroid, TSH normal at 0.5 Recurrent falls -PT consultrecommended home health PT, this was arranged History of PE/hypercoagulable state -Continue Coumadin   Discharge Instructions  Discharge Instructions    Home infusion instructions [facility] May follow [place] Dosing Protocol; May administer Cathflo as needed to maintain patency of vascular access device.; Flushing of vascular access device: per [facility] Protocol: 0.9% NaCl pre/post medica...    Complete by:  As directed    Instructions:  May follow [place] Dosing Protocol   Instructions:  May administer Cathflo as needed to maintain patency of vascular access device.   Instructions:  Flushing of vascular access device: per [facility] Protocol: 0.9% NaCl pre/post medication administration and prn patency; Heparin 100 u/ml, 34m for implanted ports and Heparin 10u/ml, 530mfor all other central venous catheters.   Instructions:  May follow [facility] Anaphylaxis Protocol for First Dose Administration in the home: 0.9% NaCl at 25-50 ml/hr to maintain IV access for protocol meds. Epinephrine 0.3 ml IV/IM PRN and Benadryl 25-50 IV/IM PRN s/s of anaphylaxis.   Instructions:  [facility] Coordinator (RN) to assist per patient IV care needs in the home PRN.   Her PICC line was removed on monday She has finished her abx  No fevers since she got home She continues to feel tired, "lazy" and wiped out.  No chills noted. She does "hurt  all over cause of that fibromyalgia" but otherwise no body aches She feels like her Parkinson's disease sx are worse since her episode of sepsis She notes that she slipped onto her behind while getting out of bed just yesterday; she put her feet on the carpet and slid down to the floor- she did not hit her head   She will see Dr. TaCarles Colletn about 2 months.  However she feels like her parkinson disease sx are escalating- harder to walk- and would like to see her sooner if she can.  No change to her anti- parkinsons drugs recently   [facility] but nothing further   Noted tachycardia today- she has not noted any palpations or chest tightness  She has continued to lose weight since leaving the hospital for sepsis- pt is pleased that she has lost but I am concerned about her eating.  She states that she is eating well  Lab Results  Component Value Date   TSH 0.505 11/24/2016    BP Readings from Last 3 Encounters:  12/13/16 106/64  12/07/16 135/64  11/28/16 (!) 160/69   Wt Readings from Last 3 Encounters:  12/13/16 196 lb (88.9 kg)  12/07/16 195 lb (88.5 kg)  11/28/16 210 lb 5.1 oz (95.4 kg)    Pulse Readings from Last 3 Encounters:  12/13/16 96  12/07/16 92  11/28/16 80      Patient Active Problem List   Diagnosis Date Noted  . Recurrent UTI 11/23/2016  . Fall at nursing home 11/23/2016  . PD (Parkinson's disease) ([place]) 11/23/2016  . Sepsis due to urinary tract infection ([place]) 11/23/2016  . Fibromyalgia 11/23/2016  . Hypercholesterolemia 11/23/2016  . Hypertension 11/23/2016  . Hypothyroidism, adult 11/23/2016  . Elevated CK 11/23/2016  . Pulmonary HTN ([place]) 11/23/2016  . Left ventricular diastolic dysfunction 96/28/3662  . Metabolic encephalopathy 94/76/5465  . History of pulmonary embolus (PE) 2/2 hypercoagulable state 11/23/2016  . Sepsis ([place]) 09/17/2016  . Acute metabolic encephalopathy 03/54/6568  . Cellulitis of right  foot 09/17/2016  . Fall   . UTI (urinary tract infection) 04/29/2016  . SIRS (systemic inflammatory response syndrome) ([place]) 04/29/2016  . Hyperlipidemia 04/29/2016  . Hypothyroidism 04/29/2016  . Hematuria 04/05/2016  . Cough 04/05/2016  . Benign essential HTN   . Intractable pain 12/17/2015  . Displaced fracture of fifth cervical vertebra ([place]) 12/17/2015  . Gait disturbance 12/17/2015  . Trochanteric bursitis of both hips 09/13/2015  . Chronic low back pain 06/07/2015  . Fibromyalgia syndrome 05/17/2015  . Thyroid disease 05/17/2015  . Parkinson's disease ([place]) 05/17/2015  . Hypercoagulable state ([place]) 02/07/2012  . History of pulmonary embolism 02/07/2012  . S/P right TKA 05/22/2011    Past Medical History:  Diagnosis Date  . Arthritis   . Chronic insomnia   . Complication of anesthesia    severe itching night of surgery requiring meds- also couldnt swallow- throat "paralyzed""  . Fibromyalgia   . Glaucoma   . Hypercholesterolemia   . Hyperlipidemia   . Hypertension    PCP Dr Cari Caraway- [facility]  05/15/11 with clearance and note on chart,    chest x ray, EKG 12/12 [facility], eccho 7/11 [facility]  . Hypothyroidism    s/p graves disease  . Kidney stone   . PD (Parkinson's disease) ([place])   . Peripheral vascular disease ([place])    PULMONARY EMBOLUS x 2- 2011, 2012/ FOLLOWED BY DR [id number] 12/12 [facility]   PT [facility]AES WILL STOP COUMADIN 3/12 and begin LOVONOX 05/17/11 as previously instructed  . Sinus infection    at present- dr Honor Loh PA aware- was seen there and at PCP 05/10/11  . Thyroid disease    Hypothyroidism    Past Surgical History:  Procedure Laterality Date  . ABDOMINAL HYSTERECTOMY    . APPENDECTOMY    . CATARACT EXTRACTION Bilateral   . CHOLECYSTECTOMY    . COLONOSCOPY    . EXPLORATORY LAPAROTOMY    . JOINT REPLACEMENT     left knee  7/12  . TOTAL KNEE ARTHROPLASTY  05/22/2011   Procedure: TOTAL KNEE ARTHROPLASTY;  Surgeon: Mauri Pole, MD;  Location: [facility];  Service:  Orthopedics;  Laterality: Right;    Social History  Substance Use Topics  . Smoking status: Former Smoker    Packs/day: 1.00    Quit date: 05/10/1989  . Smokeless tobacco: Never Used  . Alcohol use 0.0 oz/week     Comment: one drink once a month    Family History  Problem Relation Age of Onset  . Prostate cancer Father   . Stroke Father     Allergies  Allergen Reactions  . Crestor [Rosuvastatin Calcium] Other (See Comments)    Feeling very bad, aching all over.  . Erythromycin Hives  . Gabapentin Other (See Comments)    Blurred vision  . Pregabalin Other (See Comments)  Crossed vision.  . Carbidopa Other (See Comments)    Headaches, nausea, dizziness  . Macrobid [Nitrofurantoin] Hives  . Losartan Itching    Medication list has been reviewed and updated.  Current Outpatient Prescriptions on File Prior to Visit  Medication Sig Dispense Refill  . acetaminophen (TYLENOL) 500 MG tablet Take 500 mg by mouth every 6 (six) hours as needed for mild pain.    Marland Kitchen albuterol (PROVENTIL HFA;VENTOLIN HFA) 108 (90 Base) MCG/ACT inhaler Inhale 2 puffs into the lungs every 6 (six) hours as needed for wheezing or shortness of breath. 1 Inhaler 0  . atorvastatin (LIPITOR) 10 MG tablet Take 1 tablet (10 mg total) by mouth daily. 90 tablet 3  . Carbidopa-Levodopa ER (RYTARY) 23.75-95 MG CPCR Take 1 tablet by mouth 3 (three) times daily. 100 capsule 0  . celecoxib (CELEBREX) 100 MG capsule Take 1 capsule (100 mg total) by mouth 2 (two) times daily. 180 capsule 3  . cetirizine (ZYRTEC) 10 MG tablet Take 1 tablet (10 mg total) by mouth daily. 30 tablet 1  . Cholecalciferol (VITAMIN D3) 5000 UNITS CAPS Take 5,000 Units by mouth daily.     . cyanocobalamin 500 MCG tablet Take 500 mcg by mouth daily.    . cycloSPORINE (RESTASIS) 0.05 % ophthalmic emulsion Place 1 drop into both eyes 2 (two) times daily.    . DULoxetine (CYMBALTA) 60 MG capsule Take 1 capsule (60 mg total) by mouth daily. 90 capsule  3  . fluticasone (FLONASE) 50 MCG/ACT nasal spray Place 1 spray into both nostrils daily.    Marland Kitchen HYDROcodone-acetaminophen (NORCO/VICODIN) 5-325 MG tablet Take 1 tablet by mouth every 6 (six) hours as needed for moderate pain or severe pain. 10 tablet 0  . magnesium oxide (MAG-OX) 400 (241.3 Mg) MG tablet Take 0.5 tablets (200 mg total) by mouth daily. 30 tablet 0  . Melatonin 5 MG TABS Take 1 tablet by mouth at bedtime as needed (for sleep).    . Menthol, Topical Analgesic, (BIOFREEZE EX) Apply 1 application topically daily as needed (for pain).    . methocarbamol (ROBAXIN) 500 MG tablet Take 500 mg by mouth every 6 (six) hours as needed for muscle spasms.    . nicotine polacrilex (NICORETTE) 2 MG gum Take 2 mg by mouth as needed for smoking cessation.    . Omega-3 Fatty Acids (FISH OIL PO) Take 1 capsule by mouth daily.    . ondansetron (ZOFRAN ODT) 4 MG disintegrating tablet Take 1 tablet (4 mg total) by mouth every 8 (eight) hours as needed for nausea or vomiting. 20 tablet 0  . Oxycodone HCl 10 MG TABS Take 1/2 tablet daily as needed for neck pain. If pain persists may take another 1/2 tablet per day (Patient taking differently: Take 5 mg by mouth See admin instructions. Take 5 mg daily as needed for neck pain. If pain persists may take another 5 mg per day) 5 tablet 0  . SYNTHROID 150 MCG tablet Take 1 tablet (150 mcg total) by mouth daily before breakfast. 90 tablet 3  . telmisartan-hydrochlorothiazide (MICARDIS HCT) 40-12.5 MG tablet Take 1 tablet by mouth daily. 90 tablet 3  . warfarin (COUMADIN) 4 MG tablet Take 1 tablet (4 mg total) by mouth See admin instructions. Adjust as directed by MD (Patient taking differently: Take 4 mg by mouth daily. Adjust as directed by MD) 90 tablet 3  . zolpidem (AMBIEN) 5 MG tablet TAKE 1 TABLET BY MOUTH ONCE DAILY AT BEDTIME AS NEEDED FOR  SLEEP 30 tablet 2   No current facility-administered medications on file prior to visit.     Review of Systems:  As  per HPI- otherwise negative.   Physical Examination: Vitals:   12/13/16 1245 12/13/16 1308  BP: 106/64   Pulse: (!) 114 96  Temp: 98.1 F (36.7 C)   SpO2: 96%    Vitals:   12/13/16 1245  Weight: 196 lb (88.9 kg)  Height: '5\' 6"'  (1.676 m)   Body mass index is 31.64 kg/m. Ideal Body Weight: Weight in (lb) to have BMI = 25: 154.6  GEN: WDWN, NAD, Non-toxic, A & O x 3, overweight but has lost.  Seems in good spirits and overall looks well HEENT: Atraumatic, Normocephalic. Neck supple. No masses, No LAD. Ears and Nose: No external deformity. CV: RRR, No M/G/R. No JVD. No thrill. No extra heart sounds. PULM: CTA B, no wheezes, crackles, rhonchi. No retractions. No resp. distress. No accessory muscle use. ABD: S, NT, ND. No rebound. No HSM. EXTR: No c/c/e NEURO Normal gait.  PSYCH: Normally interactive. Conversant. Not depressed or anxious appearing.  Calm demeanor.  Her parkinson's symptoms appear to be worse than before- her gait is more shuffling with smaller steps and her face is more mask- like Here today with her daughter and grand-daughter  Assessment and Plan: Acute pyelonephritis - Plan: Urine Culture  Essential hypertension  Long term (current) use of anticoagulants - Plan: INR/PT  Parkinson disease ([place])  Here today to follow-up from hospital stay for urosepsis, and more recent ER visit for fall Repeat urine culture today- she has completed her abx and her urosepsis sx have resolved.  PICC line is removed She feels like her Parkinson symptoms are worse and I agree- her gait is a lot worse than the last time I saw her.  Will contact her neurologist and see if they might be abel to see her before December  Her BP is soft today- she has lost weight, and was recently quite ill which may be contributing to her lower BP Will decrease her BP med by 50% Need to repeat her INR today Plan for follow-up in 2 weeks to check on her progress, BP, and weight  Signed Lamar Blinks, MD  Received her labs and gave her a call on 10/12- could not reach pt at her home number [facility] and spoke with her- gave urine culture, negative Would like to decrease her coumadin by about 10%; we think she is taking 4 mg a day or 28 mg total per week.  Would like her to take a 1/2 tablet one day of the week, and we will repeat her INR in 2 weeks    Results for orders placed or performed in visit on 12/13/16  Urine Culture  Result Value Ref Range   MICRO NUMBER: [id number]    SPECIMEN QUALITY: ADEQUATE    Sample Source NOT GIVEN    STATUS: FINAL    Result:      Three or more organisms present, each greater than 10,000 cu/mL. May represent normal flora contamination from external genitalia. No further testing is required.  INR/PT  Result Value Ref Range   INR 3.1 (H) 0.8 - 1.0 ratio   Prothrombin Time 33.5 (H) 9.6 - 13.1 sec   Called Maisie again on 10/15-  Reached her and discussed her urine culture She is taking 4 mg of coumadin daily She will decrease one dose to 2 mg and come in for an INR  next week- will order for her as a lab draw only

## 2016-12-13 ENCOUNTER — Ambulatory Visit (INDEPENDENT_AMBULATORY_CARE_PROVIDER_SITE_OTHER): Payer: Medicare Other | Admitting: Family Medicine

## 2016-12-13 ENCOUNTER — Telehealth: Payer: Self-pay | Admitting: *Deleted

## 2016-12-13 VITALS — BP 106/64 | HR 96 | Temp 98.1°F | Ht 66.0 in | Wt 196.0 lb

## 2016-12-13 DIAGNOSIS — Z7901 Long term (current) use of anticoagulants: Secondary | ICD-10-CM | POA: Diagnosis not present

## 2016-12-13 DIAGNOSIS — N10 Acute tubulo-interstitial nephritis: Secondary | ICD-10-CM | POA: Diagnosis not present

## 2016-12-13 DIAGNOSIS — G20 Parkinson's disease: Secondary | ICD-10-CM

## 2016-12-13 DIAGNOSIS — I10 Essential (primary) hypertension: Secondary | ICD-10-CM | POA: Diagnosis not present

## 2016-12-13 LAB — PROTIME-INR
INR: 3.1 ratio — ABNORMAL HIGH (ref 0.8–1.0)
PROTHROMBIN TIME: 33.5 s — AB (ref 9.6–13.1)

## 2016-12-13 NOTE — Patient Instructions (Addendum)
It was good to see you again today- I am sorry that you are still feeling so wiped out from your recnet illness Your blood protein stores are a bit low- be sure to eat enough protein to help build up your strength again  We will culture your urine and also check your INR today I will touch base with Dr. [name] and see if she might be able to bring you into the office sooner Do be sure to wear your non- skid socks since you have slipped on your carpet   Your blood pressure is a bit low- please decrease your blood pressure medication (telmisartan/hctz) to a 1/2 pill daily for the time being  Please see me in about 2 weeks for a recheck visit

## 2016-12-13 NOTE — Telephone Encounter (Signed)
Received duplicate copies of lab results for patient from [facility] and [facility]; forwarded to provider/SLS 10/10

## 2016-12-14 ENCOUNTER — Telehealth: Payer: Self-pay | Admitting: *Deleted

## 2016-12-14 DIAGNOSIS — A4151 Sepsis due to Escherichia coli [E. coli]: Secondary | ICD-10-CM | POA: Diagnosis not present

## 2016-12-14 DIAGNOSIS — N390 Urinary tract infection, site not specified: Secondary | ICD-10-CM | POA: Diagnosis not present

## 2016-12-14 NOTE — Telephone Encounter (Signed)
Received Physician Orders from [facility]; forwarded to provider/SLS 10/11

## 2016-12-15 DIAGNOSIS — N390 Urinary tract infection, site not specified: Secondary | ICD-10-CM | POA: Diagnosis not present

## 2016-12-15 DIAGNOSIS — A4151 Sepsis due to Escherichia coli [E. coli]: Secondary | ICD-10-CM | POA: Diagnosis not present

## 2016-12-15 LAB — URINE CULTURE
MICRO NUMBER: 81129280
SPECIMEN QUALITY:: ADEQUATE

## 2016-12-18 NOTE — Addendum Note (Signed)
Addended by: Lamar Blinks C on: 12/18/2016 05:28 PM   Modules accepted: Orders

## 2016-12-19 DIAGNOSIS — N390 Urinary tract infection, site not specified: Secondary | ICD-10-CM | POA: Diagnosis not present

## 2016-12-19 DIAGNOSIS — A4151 Sepsis due to Escherichia coli [E. coli]: Secondary | ICD-10-CM | POA: Diagnosis not present

## 2016-12-20 DIAGNOSIS — N390 Urinary tract infection, site not specified: Secondary | ICD-10-CM | POA: Diagnosis not present

## 2016-12-20 DIAGNOSIS — A4151 Sepsis due to Escherichia coli [E. coli]: Secondary | ICD-10-CM | POA: Diagnosis not present

## 2016-12-22 DIAGNOSIS — N390 Urinary tract infection, site not specified: Secondary | ICD-10-CM | POA: Diagnosis not present

## 2016-12-22 DIAGNOSIS — A4151 Sepsis due to Escherichia coli [E. coli]: Secondary | ICD-10-CM | POA: Diagnosis not present

## 2016-12-25 DIAGNOSIS — R351 Nocturia: Secondary | ICD-10-CM | POA: Diagnosis not present

## 2016-12-25 DIAGNOSIS — N390 Urinary tract infection, site not specified: Secondary | ICD-10-CM | POA: Diagnosis not present

## 2016-12-25 DIAGNOSIS — R350 Frequency of micturition: Secondary | ICD-10-CM | POA: Diagnosis not present

## 2016-12-25 DIAGNOSIS — N3946 Mixed incontinence: Secondary | ICD-10-CM | POA: Diagnosis not present

## 2016-12-25 NOTE — Progress Notes (Deleted)
[place] at [facility] [address] [place], [place] [address] [phone]  Date:  12/27/2016   Name:  [name] [name]   DOB:  Feb 21, 1946   MRN:  [id number]  PCP:  Darreld Mclean, MD    Chief Complaint: No chief complaint on file.   History of Present Illness:  [name] [name] is a 71 y.o. very pleasant female patient who presents with the following:  Seen by myself on 10/10 following an admission for urospesis and another ER visit for fall:  Here today to follow-up from hospital stay for urosepsis, and more recent ER visit for fall Repeat urine culture today- she has completed her abx and her urosepsis sx have resolved.  PICC line is removed She feels like her Parkinson symptoms are worse and I agree- her gait is a lot worse than the last time I saw her.  Will contact her neurologist and see if they might be abel to see her before December  Her BP is soft today- she has lost weight, and was recently quite ill which may be contributing to her lower BP Will decrease her BP med by 50% Need to repeat her INR today Plan for follow-up in 2 weeks to check on her progress, BP, and weight  Received her labs and gave her a call on 10/12- could not reach pt at her home number [facility] and spoke with her- gave urine culture, negative Would like to decrease her coumadin by about 10%; we think she is taking 4 mg a day or 28 mg total per week.  Would like her to take a 1/2 tablet one day of the week, and we will repeat her INR in 2 weeks   Recheck visit today Patient Active Problem List   Diagnosis Date Noted  . Recurrent UTI 11/23/2016  . Fall at nursing home 11/23/2016  . PD (Parkinson's disease) ([place]) 11/23/2016  . Sepsis due to urinary tract infection ([place]) 11/23/2016  . Fibromyalgia 11/23/2016  . Hypercholesterolemia 11/23/2016  . Hypertension 11/23/2016  . Hypothyroidism, adult 11/23/2016  . Elevated CK 11/23/2016  .  Pulmonary HTN ([place]) 11/23/2016  . Left ventricular diastolic dysfunction 62/69/4854  . Metabolic encephalopathy 62/70/3500  . History of pulmonary embolus (PE) 2/2 hypercoagulable state 11/23/2016  . Sepsis ([place]) 09/17/2016  . Acute metabolic encephalopathy 93/81/8299  . Cellulitis of right foot 09/17/2016  . Fall   . UTI (urinary tract infection) 04/29/2016  . SIRS (systemic inflammatory response syndrome) ([place]) 04/29/2016  . Hyperlipidemia 04/29/2016  . Hypothyroidism 04/29/2016  . Hematuria 04/05/2016  . Cough 04/05/2016  . Benign essential HTN   . Intractable pain 12/17/2015  . Displaced fracture of fifth cervical vertebra ([place]) 12/17/2015  . Gait disturbance 12/17/2015  . Trochanteric bursitis of both hips 09/13/2015  . Chronic low back pain 06/07/2015  . Fibromyalgia syndrome 05/17/2015  . Thyroid disease 05/17/2015  . Parkinson's disease ([place]) 05/17/2015  . Hypercoagulable state ([place]) 02/07/2012  . History of pulmonary embolism 02/07/2012  . S/P right TKA 05/22/2011    Past Medical History:  Diagnosis Date  . Arthritis   . Chronic insomnia   . Complication of anesthesia    severe itching night of surgery requiring meds- also couldnt swallow- throat "paralyzed""  . Fibromyalgia   . Glaucoma   . Hypercholesterolemia   . Hyperlipidemia   . Hypertension    PCP Dr Cari Caraway- [place]  05/15/11 with clearance and note on chart,  chest x ray, EKG 12/12 EPIC, eccho 7/11 EPIC  . Hypothyroidism    s/p graves disease  . Kidney stone   . PD (Parkinson's disease) ([place])   . Peripheral vascular disease ([place])    PULMONARY EMBOLUS x 2- 2011, 2012/ FOLLOWED BY DR ODOGWU-LOV 12/12 EPIC   PT STAES WILL STOP COUMADIN 3/12 and begin LOVONOX 05/17/11 as previously instructed  . Sinus infection    at present- dr Honor Loh PA aware- was seen there and at PCP 05/10/11  . Thyroid disease    Hypothyroidism    Past Surgical History:  Procedure Laterality Date  . ABDOMINAL HYSTERECTOMY    .  APPENDECTOMY    . CATARACT EXTRACTION Bilateral   . CHOLECYSTECTOMY    . COLONOSCOPY    . EXPLORATORY LAPAROTOMY    . JOINT REPLACEMENT     left knee  7/12  . TOTAL KNEE ARTHROPLASTY  05/22/2011   Procedure: TOTAL KNEE ARTHROPLASTY;  Surgeon: Mauri Pole, MD;  Location: [facility];  Service: Orthopedics;  Laterality: Right;    Social History  Substance Use Topics  . Smoking status: Former Smoker    Packs/day: 1.00    Quit date: 05/10/1989  . Smokeless tobacco: Never Used  . Alcohol use 0.0 oz/week     Comment: one drink once a month    Family History  Problem Relation Age of Onset  . Prostate cancer Father   . Stroke Father     Allergies  Allergen Reactions  . Crestor [Rosuvastatin Calcium] Other (See Comments)    Feeling very bad, aching all over.  . Erythromycin Hives  . Gabapentin Other (See Comments)    Blurred vision  . Pregabalin Other (See Comments)    Crossed vision.  . Carbidopa Other (See Comments)    Headaches, nausea, dizziness  . Macrobid [Nitrofurantoin] Hives  . Losartan Itching    Medication list has been reviewed and updated.  Current Outpatient Prescriptions on File Prior to Visit  Medication Sig Dispense Refill  . acetaminophen (TYLENOL) 500 MG tablet Take 500 mg by mouth every 6 (six) hours as needed for mild pain.    Marland Kitchen albuterol (PROVENTIL HFA;VENTOLIN HFA) 108 (90 Base) MCG/ACT inhaler Inhale 2 puffs into the lungs every 6 (six) hours as needed for wheezing or shortness of breath. 1 Inhaler 0  . atorvastatin (LIPITOR) 10 MG tablet Take 1 tablet (10 mg total) by mouth daily. 90 tablet 3  . Carbidopa-Levodopa ER (RYTARY) 23.75-95 MG CPCR Take 1 tablet by mouth 3 (three) times daily. 100 capsule 0  . celecoxib (CELEBREX) 100 MG capsule Take 1 capsule (100 mg total) by mouth 2 (two) times daily. 180 capsule 3  . cetirizine (ZYRTEC) 10 MG tablet Take 1 tablet (10 mg total) by mouth daily. 30 tablet 1  . Cholecalciferol (VITAMIN D3) 5000 UNITS CAPS Take  5,000 Units by mouth daily.     . cyanocobalamin 500 MCG tablet Take 500 mcg by mouth daily.    . cycloSPORINE (RESTASIS) 0.05 % ophthalmic emulsion Place 1 drop into both eyes 2 (two) times daily.    . DULoxetine (CYMBALTA) 60 MG capsule Take 1 capsule (60 mg total) by mouth daily. 90 capsule 3  . fluticasone (FLONASE) 50 MCG/ACT nasal spray Place 1 spray into both nostrils daily.    Marland Kitchen HYDROcodone-acetaminophen (NORCO/VICODIN) 5-325 MG tablet Take 1 tablet by mouth every 6 (six) hours as needed for moderate pain or severe pain. 10 tablet 0  . magnesium oxide (MAG-OX) 400 (  241.3 Mg) MG tablet Take 0.5 tablets (200 mg total) by mouth daily. 30 tablet 0  . Melatonin 5 MG TABS Take 1 tablet by mouth at bedtime as needed (for sleep).    . Menthol, Topical Analgesic, (BIOFREEZE EX) Apply 1 application topically daily as needed (for pain).    . methocarbamol (ROBAXIN) 500 MG tablet Take 500 mg by mouth every 6 (six) hours as needed for muscle spasms.    . nicotine polacrilex (NICORETTE) 2 MG gum Take 2 mg by mouth as needed for smoking cessation.    . Omega-3 Fatty Acids (FISH OIL PO) Take 1 capsule by mouth daily.    . ondansetron (ZOFRAN ODT) 4 MG disintegrating tablet Take 1 tablet (4 mg total) by mouth every 8 (eight) hours as needed for nausea or vomiting. 20 tablet 0  . Oxycodone HCl 10 MG TABS Take 1/2 tablet daily as needed for neck pain. If pain persists may take another 1/2 tablet per day (Patient taking differently: Take 5 mg by mouth See admin instructions. Take 5 mg daily as needed for neck pain. If pain persists may take another 5 mg per day) 5 tablet 0  . SYNTHROID 150 MCG tablet Take 1 tablet (150 mcg total) by mouth daily before breakfast. 90 tablet 3  . telmisartan-hydrochlorothiazide (MICARDIS HCT) 40-12.5 MG tablet Take 1 tablet by mouth daily. 90 tablet 3  . warfarin (COUMADIN) 4 MG tablet Take 1 tablet (4 mg total) by mouth See admin instructions. Adjust as directed by MD (Patient  taking differently: Take 4 mg by mouth daily. Adjust as directed by MD) 90 tablet 3  . zolpidem (AMBIEN) 5 MG tablet TAKE 1 TABLET BY MOUTH ONCE DAILY AT BEDTIME AS NEEDED FOR SLEEP 30 tablet 2   No current facility-administered medications on file prior to visit.     Review of Systems:  As per HPI- otherwise negative.   Physical Examination: There were no vitals filed for this visit. There were no vitals filed for this visit. There is no height or weight on file to calculate BMI. Ideal Body Weight:    GEN: WDWN, NAD, Non-toxic, A & O x 3 HEENT: Atraumatic, Normocephalic. Neck supple. No masses, No LAD. Ears and Nose: No external deformity. CV: RRR, No M/G/R. No JVD. No thrill. No extra heart sounds. PULM: CTA B, no wheezes, crackles, rhonchi. No retractions. No resp. distress. No accessory muscle use. ABD: S, NT, ND, +BS. No rebound. No HSM. EXTR: No c/c/e NEURO Normal gait.  PSYCH: Normally interactive. Conversant. Not depressed or anxious appearing.  Calm demeanor.    Assessment and Plan: ***  Signed Lamar Blinks, MD

## 2016-12-26 DIAGNOSIS — A4151 Sepsis due to Escherichia coli [E. coli]: Secondary | ICD-10-CM | POA: Diagnosis not present

## 2016-12-26 DIAGNOSIS — N390 Urinary tract infection, site not specified: Secondary | ICD-10-CM | POA: Diagnosis not present

## 2016-12-26 NOTE — Progress Notes (Signed)
[place] at [facility] [address], [place], [place] [address] [phone]  Date:  12/28/2016   Name:  [name] [name]   DOB:  [birth year]   MRN:  [id number]  PCP:  Darreld Mclean, MD    Chief Complaint: No chief complaint on file.   History of Present Illness:  [name] [name] is a 71 y.o. very pleasant female patient who presents with the following:  Follow-up visit today- need to check her INR again today I last saw her on 10/10  Patient's a 71 year old female presents to [name] from her residence where she fell backwards while in the kitchen making a sandwich. She states that she struck her head. She reports head and neck pain. She also reports pain in her low back and her bilateral hips left greater than right. She denies loss consciousness. Mild headache at this time. Denies weakness or numbness of her upper lower extremities. Denies chest pain. No shortness breath. Denies abdominal pain. Symptoms are mild to moderate in severity. She does report a headache now. She is on Coumadin. She has a history of Parkinson's disease and walks with a walker. She states it is somewhat of a frequent occurrence where she falls backwards.  She was also admitted last month with urosepsis:  Admit date: 11/23/2016 Discharge date:11/28/2016  Her repeat urine culture was negative   She saw urology recently -they did a bladder scope and started her on a daily abx that she will take "for life," she notes that she feels 'like a new woman" which is great news   She saw Dr. Virgina Norfolk at [facility]- he started her on trimethoprim 100 once a day   She is taking 4 mg of coumadin 5 days, 2 mg the other 2 days  Check INR today as she was a bit high at last check.  Discussed a home INR machine- she would be quite interested in getting one of these  Lab Results  Component Value Date   INR 3.1 (H) 12/13/2016   INR 3.60 12/07/2016   INR 2.79  11/28/2016   PROTIME 32.4 (H) 02/14/2011   PROTIME 36.0 (H) 07/26/2010     Patient Active Problem List   Diagnosis Date Noted  . Recurrent UTI 11/23/2016  . Fall at nursing home 11/23/2016  . PD (Parkinson's disease) ([place]) 11/23/2016  . Sepsis due to urinary tract infection ([place]) 11/23/2016  . Fibromyalgia 11/23/2016  . Hypercholesterolemia 11/23/2016  . Hypertension 11/23/2016  . Hypothyroidism, adult 11/23/2016  . Elevated CK 11/23/2016  . Pulmonary HTN ([place]) 11/23/2016  . Left ventricular diastolic dysfunction 62/37/6283  . Metabolic encephalopathy 15/17/6160  . History of pulmonary embolus (PE) 2/2 hypercoagulable state 11/23/2016  . Sepsis ([place]) 09/17/2016  . Acute metabolic encephalopathy 73/71/0626  . Cellulitis of right foot 09/17/2016  . Fall   . UTI (urinary tract infection) 04/29/2016  . SIRS (systemic inflammatory response syndrome) ([place]) 04/29/2016  . Hyperlipidemia 04/29/2016  . Hypothyroidism 04/29/2016  . Hematuria 04/05/2016  . Cough 04/05/2016  . Benign essential HTN   . Intractable pain 12/17/2015  . Displaced fracture of fifth cervical vertebra ([place]) 12/17/2015  . Gait disturbance 12/17/2015  . Trochanteric bursitis of both hips 09/13/2015  . Chronic low back pain 06/07/2015  . Fibromyalgia syndrome 05/17/2015  . Thyroid disease 05/17/2015  . Parkinson's disease ([place]) 05/17/2015  . Hypercoagulable state ([place]) 02/07/2012  . History of pulmonary embolism 02/07/2012  . S/P right TKA 05/22/2011  Past Medical History:  Diagnosis Date  . Arthritis   . Chronic insomnia   . Complication of anesthesia    severe itching night of surgery requiring meds- also couldnt swallow- throat "paralyzed""  . Fibromyalgia   . Glaucoma   . Hypercholesterolemia   . Hyperlipidemia   . Hypertension    PCP Dr Cari Caraway- [place]  05/15/11 with clearance and note on chart,    chest x ray, EKG 12/12 EPIC, eccho 7/11 EPIC  . Hypothyroidism    s/p graves disease  .  Kidney stone   . PD (Parkinson's disease) ([place])   . Peripheral vascular disease ([place])    PULMONARY EMBOLUS x 2- 2011, 2012/ FOLLOWED BY DR ODOGWU-LOV 12/12 EPIC   PT STAES WILL STOP COUMADIN 3/12 and begin LOVONOX 05/17/11 as previously instructed  . Sinus infection    at present- dr Honor Loh PA aware- was seen there and at PCP 05/10/11  . Thyroid disease    Hypothyroidism    Past Surgical History:  Procedure Laterality Date  . ABDOMINAL HYSTERECTOMY    . APPENDECTOMY    . CATARACT EXTRACTION Bilateral   . CHOLECYSTECTOMY    . COLONOSCOPY    . EXPLORATORY LAPAROTOMY    . JOINT REPLACEMENT     left knee  7/12  . TOTAL KNEE ARTHROPLASTY  05/22/2011   Procedure: TOTAL KNEE ARTHROPLASTY;  Surgeon: Mauri Pole, MD;  Location: [facility];  Service: Orthopedics;  Laterality: Right;    Social History  Substance Use Topics  . Smoking status: Former Smoker    Packs/day: 1.00    Quit date: 05/10/1989  . Smokeless tobacco: Never Used  . Alcohol use 0.0 oz/week     Comment: one drink once a month    Family History  Problem Relation Age of Onset  . Prostate cancer Father   . Stroke Father     Allergies  Allergen Reactions  . Crestor [Rosuvastatin Calcium] Other (See Comments)    Feeling very bad, aching all over.  . Erythromycin Hives  . Gabapentin Other (See Comments)    Blurred vision  . Pregabalin Other (See Comments)    Crossed vision.  . Carbidopa Other (See Comments)    Headaches, nausea, dizziness  . Macrobid [Nitrofurantoin] Hives  . Losartan Itching    Medication list has been reviewed and updated.  Current Outpatient Prescriptions on File Prior to Visit  Medication Sig Dispense Refill  . acetaminophen (TYLENOL) 500 MG tablet Take 500 mg by mouth every 6 (six) hours as needed for mild pain.    Marland Kitchen albuterol (PROVENTIL HFA;VENTOLIN HFA) 108 (90 Base) MCG/ACT inhaler Inhale 2 puffs into the lungs every 6 (six) hours as needed for wheezing or shortness of breath. 1 Inhaler 0   . atorvastatin (LIPITOR) 10 MG tablet Take 1 tablet (10 mg total) by mouth daily. 90 tablet 3  . Carbidopa-Levodopa ER (RYTARY) 23.75-95 MG CPCR Take 1 tablet by mouth 3 (three) times daily. 100 capsule 0  . celecoxib (CELEBREX) 100 MG capsule Take 1 capsule (100 mg total) by mouth 2 (two) times daily. 180 capsule 3  . cetirizine (ZYRTEC) 10 MG tablet Take 1 tablet (10 mg total) by mouth daily. 30 tablet 1  . Cholecalciferol (VITAMIN D3) 5000 UNITS CAPS Take 5,000 Units by mouth daily.     . cyanocobalamin 500 MCG tablet Take 500 mcg by mouth daily.    . cycloSPORINE (RESTASIS) 0.05 % ophthalmic emulsion Place 1 drop into both eyes 2 (two)  times daily.    . DULoxetine (CYMBALTA) 60 MG capsule Take 1 capsule (60 mg total) by mouth daily. 90 capsule 3  . fluticasone (FLONASE) 50 MCG/ACT nasal spray Place 1 spray into both nostrils daily.    Marland Kitchen HYDROcodone-acetaminophen (NORCO/VICODIN) 5-325 MG tablet Take 1 tablet by mouth every 6 (six) hours as needed for moderate pain or severe pain. 10 tablet 0  . magnesium oxide (MAG-OX) 400 (241.3 Mg) MG tablet Take 0.5 tablets (200 mg total) by mouth daily. 30 tablet 0  . Melatonin 5 MG TABS Take 1 tablet by mouth at bedtime as needed (for sleep).    . Menthol, Topical Analgesic, (BIOFREEZE EX) Apply 1 application topically daily as needed (for pain).    . methocarbamol (ROBAXIN) 500 MG tablet Take 500 mg by mouth every 6 (six) hours as needed for muscle spasms.    . nicotine polacrilex (NICORETTE) 2 MG gum Take 2 mg by mouth as needed for smoking cessation.    . Omega-3 Fatty Acids (FISH OIL PO) Take 1 capsule by mouth daily.    . ondansetron (ZOFRAN ODT) 4 MG disintegrating tablet Take 1 tablet (4 mg total) by mouth every 8 (eight) hours as needed for nausea or vomiting. 20 tablet 0  . Oxycodone HCl 10 MG TABS Take 1/2 tablet daily as needed for neck pain. If pain persists may take another 1/2 tablet per day (Patient taking differently: Take 5 mg by mouth See  admin instructions. Take 5 mg daily as needed for neck pain. If pain persists may take another 5 mg per day) 5 tablet 0  . SYNTHROID 150 MCG tablet Take 1 tablet (150 mcg total) by mouth daily before breakfast. 90 tablet 3  . telmisartan-hydrochlorothiazide (MICARDIS HCT) 40-12.5 MG tablet Take 1 tablet by mouth daily. 90 tablet 3  . warfarin (COUMADIN) 4 MG tablet Take 1 tablet (4 mg total) by mouth See admin instructions. Adjust as directed by MD (Patient taking differently: Take 4 mg by mouth daily. Adjust as directed by MD) 90 tablet 3  . zolpidem (AMBIEN) 5 MG tablet TAKE 1 TABLET BY MOUTH ONCE DAILY AT BEDTIME AS NEEDED FOR SLEEP 30 tablet 2   No current facility-administered medications on file prior to visit.     Review of Systems:  As per HPI- otherwise negative.   Physical Examination: Vitals:   12/28/16 1147  BP: 130/60  Pulse: 89  Temp: 98 F (36.7 C)  SpO2: 96%   Vitals:   12/28/16 1147  Weight: 199 lb 9.6 oz (90.5 kg)  Height: 5\' 6"  (1.676 m)   Body mass index is 32.22 kg/m. Ideal Body Weight: Weight in (lb) to have BMI = 25: 154.6  GEN: WDWN, NAD, Non-toxic, A & O x 3 HEENT: Atraumatic, Normocephalic. Neck supple. No masses, No LAD. Ears and Nose: No external deformity. CV: RRR, No M/G/R. No JVD. No thrill. No extra heart sounds. PULM: CTA B, no wheezes, crackles, rhonchi. No retractions. No resp. distress. No accessory muscle use. ABD: S, NT, ND, +BS. No rebound. No HSM. EXTR: No c/c/e NEURO Normal gait.  PSYCH: Normally interactive. Conversant. Not depressed or anxious appearing.  Calm demeanor.   INR today 2.2 Assessment and Plan: Long term (current) use of anticoagulants - Plan: POCT INR  INR check today- in range Continue current regimen Repeat in 3- 4 weeks    Signed Lamar Blinks, MD

## 2016-12-27 ENCOUNTER — Ambulatory Visit: Payer: Medicare Other | Admitting: Family Medicine

## 2016-12-27 ENCOUNTER — Ambulatory Visit: Payer: Medicare Other | Admitting: Neurology

## 2016-12-27 DIAGNOSIS — N390 Urinary tract infection, site not specified: Secondary | ICD-10-CM | POA: Diagnosis not present

## 2016-12-27 DIAGNOSIS — A4151 Sepsis due to Escherichia coli [E. coli]: Secondary | ICD-10-CM | POA: Diagnosis not present

## 2016-12-28 ENCOUNTER — Ambulatory Visit (INDEPENDENT_AMBULATORY_CARE_PROVIDER_SITE_OTHER): Payer: Medicare Other | Admitting: Family Medicine

## 2016-12-28 VITALS — BP 130/60 | HR 89 | Temp 98.0°F | Ht 66.0 in | Wt 199.6 lb

## 2016-12-28 DIAGNOSIS — Z7901 Long term (current) use of anticoagulants: Secondary | ICD-10-CM

## 2016-12-28 LAB — POCT INR: INR: 2.2

## 2016-12-28 NOTE — Patient Instructions (Signed)
Your INR looks perfect today- continue coumadin regimen: 4 mg 5 days of the week 2 mg 2 days of the week  Let's plan to repeat your INR in 3 weeks- nurse visit only is ok!  I am going to try and get you a home INR machine

## 2016-12-29 DIAGNOSIS — A4151 Sepsis due to Escherichia coli [E. coli]: Secondary | ICD-10-CM | POA: Diagnosis not present

## 2016-12-29 DIAGNOSIS — N390 Urinary tract infection, site not specified: Secondary | ICD-10-CM | POA: Diagnosis not present

## 2017-01-01 ENCOUNTER — Telehealth: Payer: Self-pay | Admitting: Family Medicine

## 2017-01-01 NOTE — Telephone Encounter (Signed)
[facility]  is requesting for a call in regarding this patient for more info and unable to provide detail info but to a nurse.   cb- [phone] ext [phone]

## 2017-01-02 DIAGNOSIS — A4151 Sepsis due to Escherichia coli [E. coli]: Secondary | ICD-10-CM | POA: Diagnosis not present

## 2017-01-02 DIAGNOSIS — N390 Urinary tract infection, site not specified: Secondary | ICD-10-CM | POA: Diagnosis not present

## 2017-01-04 ENCOUNTER — Telehealth: Payer: Self-pay | Admitting: *Deleted

## 2017-01-04 DIAGNOSIS — N390 Urinary tract infection, site not specified: Secondary | ICD-10-CM | POA: Diagnosis not present

## 2017-01-04 DIAGNOSIS — A4151 Sepsis due to Escherichia coli [E. coli]: Secondary | ICD-10-CM | POA: Diagnosis not present

## 2017-01-04 NOTE — Telephone Encounter (Signed)
Received Physician Orders from [facility]; forwarded to provider/SLS 11/01

## 2017-01-05 DIAGNOSIS — Z96653 Presence of artificial knee joint, bilateral: Secondary | ICD-10-CM | POA: Diagnosis not present

## 2017-01-05 DIAGNOSIS — M25552 Pain in left hip: Secondary | ICD-10-CM | POA: Diagnosis not present

## 2017-01-05 DIAGNOSIS — Z471 Aftercare following joint replacement surgery: Secondary | ICD-10-CM | POA: Diagnosis not present

## 2017-01-05 DIAGNOSIS — M7062 Trochanteric bursitis, left hip: Secondary | ICD-10-CM | POA: Diagnosis not present

## 2017-01-05 DIAGNOSIS — M7061 Trochanteric bursitis, right hip: Secondary | ICD-10-CM | POA: Diagnosis not present

## 2017-01-05 DIAGNOSIS — M25551 Pain in right hip: Secondary | ICD-10-CM | POA: Diagnosis not present

## 2017-01-08 ENCOUNTER — Telehealth: Payer: Self-pay

## 2017-01-08 NOTE — Telephone Encounter (Signed)
Called [name] [name] at  [facility] to follow up on message left to call him back. After speaking with rep he says he's unsure of why the call was placed. After several minutes on hold [name] [name] came back to inform me that insurance sent back the from due to providers mailing address not being on original forms, however he will resend forms over. [name] [name] informed me that the forms will have to be refilled out once again but states he will input all the information that was saved into his system.   cb- [phone] ext [phone]

## 2017-01-10 DIAGNOSIS — A4151 Sepsis due to Escherichia coli [E. coli]: Secondary | ICD-10-CM | POA: Diagnosis not present

## 2017-01-10 DIAGNOSIS — N390 Urinary tract infection, site not specified: Secondary | ICD-10-CM | POA: Diagnosis not present

## 2017-01-11 ENCOUNTER — Telehealth: Payer: Self-pay | Admitting: *Deleted

## 2017-01-11 NOTE — Telephone Encounter (Signed)
Received Patient Enrollment Form [x2] from [facility], PT/INR Home Monitoring Service; forwarded to provider/SLS 11/08

## 2017-01-17 ENCOUNTER — Ambulatory Visit (INDEPENDENT_AMBULATORY_CARE_PROVIDER_SITE_OTHER): Payer: Medicare Other

## 2017-01-17 DIAGNOSIS — Z7901 Long term (current) use of anticoagulants: Secondary | ICD-10-CM

## 2017-01-17 LAB — POCT INR
INR: 1
INR: 1.2

## 2017-01-17 NOTE — Progress Notes (Addendum)
Pre visit review using our clinic tool,if applicable. No additional management support is needed unless otherwise documented below in the visit note.   Patient in for INR check per order from Dr. Lenna Sciara [name] dated 12/28/16.  States she has not missed any doses nor had any bleeding or bruising.  Patient currently taking Coumadin 4 mg 5 days 5 days and 2 mg 2 days.  INR last visit = 2.2 INR Today = 1.2  Patients goal =2.0-3.0   Per Dr, [name] patient to take 4 mg daily and return in 1 week. Patient states she will be starting Home INR tomorrow so she will call in results.  I have reviewed above and agree with documentation- J [name] MD 01/22/17

## 2017-01-18 ENCOUNTER — Telehealth: Payer: Self-pay | Admitting: Neurology

## 2017-01-18 ENCOUNTER — Ambulatory Visit: Payer: Medicare Other

## 2017-01-18 DIAGNOSIS — Z86718 Personal history of other venous thrombosis and embolism: Secondary | ICD-10-CM | POA: Diagnosis not present

## 2017-01-18 DIAGNOSIS — Z7901 Long term (current) use of anticoagulants: Secondary | ICD-10-CM | POA: Diagnosis not present

## 2017-01-18 MED ORDER — CARBIDOPA-LEVODOPA ER 23.75-95 MG PO CPCR: 1.0000 | ORAL_CAPSULE | Freq: Three times a day (TID) | ORAL | 0 refills | Status: DC

## 2017-01-18 NOTE — Telephone Encounter (Signed)
Pt called and wants to know if she can come pick up some samples of Rytary

## 2017-01-18 NOTE — Telephone Encounter (Signed)
Pt called and wants to know if a prescription for Rytary can be called in for her

## 2017-01-18 NOTE — Telephone Encounter (Signed)
One month of medication sent to pharmacy.

## 2017-01-18 NOTE — Telephone Encounter (Signed)
No 95 mg samples available. Patient made aware.

## 2017-01-18 NOTE — Telephone Encounter (Signed)
Patient called and needed to see if Dr. [name] could call in her medication at [facility] on [address], if there are no samples? She will not have enough to last her until her next appointment. Thanks

## 2017-01-19 ENCOUNTER — Telehealth: Payer: Self-pay | Admitting: *Deleted

## 2017-01-19 DIAGNOSIS — A4151 Sepsis due to Escherichia coli [E. coli]: Secondary | ICD-10-CM | POA: Diagnosis not present

## 2017-01-19 DIAGNOSIS — N390 Urinary tract infection, site not specified: Secondary | ICD-10-CM | POA: Diagnosis not present

## 2017-01-19 NOTE — Telephone Encounter (Signed)
Received out of range PT/INR results from [facility] [1.1]; forwarded to Doc of the Day [provider out of office]/SLS 11/16

## 2017-01-19 NOTE — Telephone Encounter (Signed)
Called pt about her INR; 1.1 at her home machine today We had actually just increased her coumadin dose due to low INR on 11/14-  She is now taking 4 mg daily = 28 mg/ week, for the last 3 days Will have her take 6 mg today, 4 tomorrow and 6 on Sunday and repeat home INR on Monday  She will let me know the result on monday

## 2017-01-23 ENCOUNTER — Ambulatory Visit (INDEPENDENT_AMBULATORY_CARE_PROVIDER_SITE_OTHER): Payer: Medicare Other

## 2017-01-23 ENCOUNTER — Ambulatory Visit: Payer: Medicare Other

## 2017-01-23 DIAGNOSIS — Z7901 Long term (current) use of anticoagulants: Secondary | ICD-10-CM | POA: Diagnosis not present

## 2017-01-23 LAB — POCT INR: INR: 1.9

## 2017-01-23 NOTE — Progress Notes (Signed)
Pre visit review using our clinic tool,if applicable. No additional management support is needed unless otherwise documented below in the visit note.   Patient in for INR check per Dr. Lenna Sciara. [name] patient to take 4 mg daily and 6 mg on Sunday. Patient was supposed to call in INR  on Monday but states she ran out of strips for home monitor.   INR called in on 01/19/17 = 1.1  Patient states she has not missed any doses but has been eating a lot of salads.  INR today = 1.9  Per Dr. Charlett Blake DOD patient to take Coumadin 4 mg daily except Sunday take 6 mg and return one week from today. Appointment scheduled at front desk.

## 2017-01-23 NOTE — Telephone Encounter (Signed)
Noted, and Nurse [Karen] informed/SLS

## 2017-01-24 ENCOUNTER — Encounter: Payer: Self-pay | Admitting: Family Medicine

## 2017-01-24 ENCOUNTER — Telehealth: Payer: Self-pay | Admitting: *Deleted

## 2017-01-24 NOTE — Progress Notes (Signed)
C-Reactive Protein, Quant 4.2 mg/L

## 2017-01-24 NOTE — Telephone Encounter (Signed)
Received Physician Orders from [facility]; forwarded to provider/SLS 11/21

## 2017-01-31 ENCOUNTER — Ambulatory Visit: Payer: Medicare Other

## 2017-01-31 DIAGNOSIS — Z7901 Long term (current) use of anticoagulants: Secondary | ICD-10-CM | POA: Diagnosis not present

## 2017-01-31 DIAGNOSIS — Z86718 Personal history of other venous thrombosis and embolism: Secondary | ICD-10-CM | POA: Diagnosis not present

## 2017-02-01 DIAGNOSIS — Z9181 History of falling: Secondary | ICD-10-CM | POA: Diagnosis not present

## 2017-02-01 DIAGNOSIS — M6281 Muscle weakness (generalized): Secondary | ICD-10-CM | POA: Diagnosis not present

## 2017-02-01 DIAGNOSIS — R260 Ataxic gait: Secondary | ICD-10-CM | POA: Diagnosis not present

## 2017-02-01 DIAGNOSIS — R262 Difficulty in walking, not elsewhere classified: Secondary | ICD-10-CM | POA: Diagnosis not present

## 2017-02-02 ENCOUNTER — Ambulatory Visit: Payer: Medicare Other

## 2017-02-02 DIAGNOSIS — R260 Ataxic gait: Secondary | ICD-10-CM | POA: Diagnosis not present

## 2017-02-02 DIAGNOSIS — Z9181 History of falling: Secondary | ICD-10-CM | POA: Diagnosis not present

## 2017-02-02 DIAGNOSIS — R262 Difficulty in walking, not elsewhere classified: Secondary | ICD-10-CM | POA: Diagnosis not present

## 2017-02-02 DIAGNOSIS — M6281 Muscle weakness (generalized): Secondary | ICD-10-CM | POA: Diagnosis not present

## 2017-02-03 ENCOUNTER — Telehealth: Payer: Self-pay | Admitting: Surgical

## 2017-02-03 NOTE — Telephone Encounter (Signed)
Patient called [facility] on Saturday for her Warfarin dosage. Patient had INR on Friday that was 1.9. After reading the patients chart it looks like the patient takes 4 mg daily and 6 mg on Sunday. Per Dr. Maudie Mercury since the patient is close to range she will not change the dosage and have the patient contact her PCP office on Monday. I relayed the message to Lattie Haw the [facility] nurse to advise the patient to continue her regimen that she is on until contact with her PCP on Monday. Nurse stated that she will contact the patient to let her know.

## 2017-02-05 ENCOUNTER — Telehealth: Payer: Self-pay | Admitting: *Deleted

## 2017-02-05 ENCOUNTER — Telehealth: Payer: Self-pay

## 2017-02-05 NOTE — Telephone Encounter (Signed)
Received PT/INR results from [facility], pt has improved from [1.1] on 01/18/17 to [1.7] on 01/31/17; forwarded to provider/SLS 12/03

## 2017-02-05 NOTE — Telephone Encounter (Signed)
Copied from [place]. Topic: General - Other >> Feb 05, 2017 11:48 AM Damita Dunnings, CMA wrote: Reason for CRM: PA for Ambien 5mg  initiated via Covermymeds; KEY: [id number]. Awaiting determination.

## 2017-02-06 ENCOUNTER — Telehealth: Payer: Self-pay

## 2017-02-06 DIAGNOSIS — M6281 Muscle weakness (generalized): Secondary | ICD-10-CM | POA: Diagnosis not present

## 2017-02-06 DIAGNOSIS — M542 Cervicalgia: Secondary | ICD-10-CM | POA: Diagnosis not present

## 2017-02-06 DIAGNOSIS — Z9181 History of falling: Secondary | ICD-10-CM | POA: Diagnosis not present

## 2017-02-06 DIAGNOSIS — R262 Difficulty in walking, not elsewhere classified: Secondary | ICD-10-CM | POA: Diagnosis not present

## 2017-02-06 DIAGNOSIS — M25511 Pain in right shoulder: Secondary | ICD-10-CM | POA: Diagnosis not present

## 2017-02-06 DIAGNOSIS — R260 Ataxic gait: Secondary | ICD-10-CM | POA: Diagnosis not present

## 2017-02-06 DIAGNOSIS — M25512 Pain in left shoulder: Secondary | ICD-10-CM | POA: Diagnosis not present

## 2017-02-06 NOTE — Telephone Encounter (Signed)
Regarding status of INR checks. States she is checking  them at home. States her last INR last Wednesday was 1.7 Taking Coumadin 4 mg daily. States the company is supposed to call to let provider know results. Patient states she is having checked again tomorrow 02/07/17.

## 2017-02-06 NOTE — Telephone Encounter (Signed)
Copied from [place]. Topic: Quick Communication - See Telephone Encounter >> Feb 06, 2017  6:00 PM Marin Olp L wrote: CRM for notification. See Telephone encounter for: 02/06/17.  [employer] called to confirm PA for Ambien was approved.

## 2017-02-07 ENCOUNTER — Encounter: Payer: Self-pay | Admitting: Family Medicine

## 2017-02-07 ENCOUNTER — Telehealth: Payer: Self-pay | Admitting: *Deleted

## 2017-02-07 DIAGNOSIS — R260 Ataxic gait: Secondary | ICD-10-CM | POA: Diagnosis not present

## 2017-02-07 DIAGNOSIS — M542 Cervicalgia: Secondary | ICD-10-CM | POA: Diagnosis not present

## 2017-02-07 DIAGNOSIS — R262 Difficulty in walking, not elsewhere classified: Secondary | ICD-10-CM | POA: Diagnosis not present

## 2017-02-07 DIAGNOSIS — M6281 Muscle weakness (generalized): Secondary | ICD-10-CM | POA: Diagnosis not present

## 2017-02-07 DIAGNOSIS — M25511 Pain in right shoulder: Secondary | ICD-10-CM | POA: Diagnosis not present

## 2017-02-07 DIAGNOSIS — M25512 Pain in left shoulder: Secondary | ICD-10-CM | POA: Diagnosis not present

## 2017-02-07 NOTE — Telephone Encounter (Signed)
Please call her- we can put her on my schedule for tomorrow and we will check her INR

## 2017-02-07 NOTE — Telephone Encounter (Signed)
Received Missed Visit Notification from [facility]; forwarded to provider/SLS 12/05

## 2017-02-07 NOTE — Telephone Encounter (Signed)
Pt called in to request to speak with nurse, stating that she is returning call. Nurse unavailable. Pt then states that she would like to schedule an INR check for tomorrow morning (02/08/17) advised pt that Im not showing an opening in the morning with nurse. Pt then asked to speak with PCP instead of nurse.    Please call back

## 2017-02-07 NOTE — Telephone Encounter (Signed)
PA approved effective 02/05/2017 through 02/05/2018.

## 2017-02-07 NOTE — Telephone Encounter (Signed)
Called Pt to see how she was doing and discuss phone call from earlier. Pt did not answer, left a VM. Pt is scheduled to come in tomorrow for nurse visit. Will offer her an appointment time to be seen by Dr.[name] (ok per Dr.[name]) if she'd prefer.

## 2017-02-07 NOTE — Progress Notes (Unsigned)
Pt has an appt tomorrow for INR check

## 2017-02-08 ENCOUNTER — Ambulatory Visit (INDEPENDENT_AMBULATORY_CARE_PROVIDER_SITE_OTHER): Payer: Medicare Other

## 2017-02-08 ENCOUNTER — Other Ambulatory Visit: Payer: Self-pay | Admitting: Family Medicine

## 2017-02-08 DIAGNOSIS — Z7901 Long term (current) use of anticoagulants: Secondary | ICD-10-CM | POA: Diagnosis not present

## 2017-02-08 DIAGNOSIS — M542 Cervicalgia: Secondary | ICD-10-CM | POA: Diagnosis not present

## 2017-02-08 DIAGNOSIS — R262 Difficulty in walking, not elsewhere classified: Secondary | ICD-10-CM | POA: Diagnosis not present

## 2017-02-08 DIAGNOSIS — R260 Ataxic gait: Secondary | ICD-10-CM | POA: Diagnosis not present

## 2017-02-08 DIAGNOSIS — M25511 Pain in right shoulder: Secondary | ICD-10-CM | POA: Diagnosis not present

## 2017-02-08 DIAGNOSIS — M6281 Muscle weakness (generalized): Secondary | ICD-10-CM | POA: Diagnosis not present

## 2017-02-08 DIAGNOSIS — M25512 Pain in left shoulder: Secondary | ICD-10-CM | POA: Diagnosis not present

## 2017-02-08 LAB — POCT INR: INR: 2.2

## 2017-02-08 NOTE — Progress Notes (Signed)
79.01Pre visit review using our clinic tool,if applicable. No additional management support is needed unless otherwise documented below in the visit note.   Patient in for INR check per order from Dr. Lenna Sciara [name].  INR on last visit was 1.7 per patient using home device.  INR today = 2.0 Goal is 2.0-3.0  Patient currently taking Coumadin 4mg  daily. No complaints voiced this visit.  Per Dr. Lorelei Pont patient to continue taking Coumadin 4 mg daily and return for INR check in 2 weeks. Patient advised to bring Home INR machine to visit for evaluation. Patient agreed. Appointment scheduled.

## 2017-02-09 ENCOUNTER — Telehealth: Payer: Self-pay | Admitting: Family Medicine

## 2017-02-09 DIAGNOSIS — M6281 Muscle weakness (generalized): Secondary | ICD-10-CM | POA: Diagnosis not present

## 2017-02-09 DIAGNOSIS — R260 Ataxic gait: Secondary | ICD-10-CM | POA: Diagnosis not present

## 2017-02-09 DIAGNOSIS — M542 Cervicalgia: Secondary | ICD-10-CM | POA: Diagnosis not present

## 2017-02-09 DIAGNOSIS — M25512 Pain in left shoulder: Secondary | ICD-10-CM | POA: Diagnosis not present

## 2017-02-09 DIAGNOSIS — M25511 Pain in right shoulder: Secondary | ICD-10-CM | POA: Diagnosis not present

## 2017-02-09 DIAGNOSIS — R262 Difficulty in walking, not elsewhere classified: Secondary | ICD-10-CM | POA: Diagnosis not present

## 2017-02-09 NOTE — Telephone Encounter (Signed)
Pt is requesting refill on Ambien 5mg . Please advise.

## 2017-02-09 NOTE — Telephone Encounter (Signed)
Copied from [place] [phone]. Topic: Quick Communication - See Telephone Encounter >> Feb 09, 2017 12:08 PM Bea Graff, NT wrote: CRM for notification. See Telephone encounter for: Pt needing her Ambien refilled. She uses Product/process development scientist on [address].   02/09/17.

## 2017-02-10 ENCOUNTER — Encounter: Payer: Self-pay | Admitting: Family Medicine

## 2017-02-11 ENCOUNTER — Emergency Department (HOSPITAL_BASED_OUTPATIENT_CLINIC_OR_DEPARTMENT_OTHER): Payer: Medicare Other

## 2017-02-11 ENCOUNTER — Inpatient Hospital Stay (HOSPITAL_BASED_OUTPATIENT_CLINIC_OR_DEPARTMENT_OTHER)
Admission: EM | Admit: 2017-02-11 | Discharge: 2017-02-15 | DRG: 871 | Disposition: A | Discharge status: Skilled Nursing Facility | Payer: Medicare Other | Attending: Family Medicine | Admitting: Family Medicine

## 2017-02-11 ENCOUNTER — Other Ambulatory Visit: Payer: Self-pay

## 2017-02-11 ENCOUNTER — Encounter (HOSPITAL_BASED_OUTPATIENT_CLINIC_OR_DEPARTMENT_OTHER): Payer: Self-pay

## 2017-02-11 DIAGNOSIS — E0500 Thyrotoxicosis with diffuse goiter without thyrotoxic crisis or storm: Secondary | ICD-10-CM | POA: Diagnosis present

## 2017-02-11 DIAGNOSIS — W19XXXA Unspecified fall, initial encounter: Secondary | ICD-10-CM | POA: Diagnosis not present

## 2017-02-11 DIAGNOSIS — Z87442 Personal history of urinary calculi: Secondary | ICD-10-CM

## 2017-02-11 DIAGNOSIS — Z881 Allergy status to other antibiotic agents status: Secondary | ICD-10-CM | POA: Diagnosis not present

## 2017-02-11 DIAGNOSIS — Z8042 Family history of malignant neoplasm of prostate: Secondary | ICD-10-CM

## 2017-02-11 DIAGNOSIS — I10 Essential (primary) hypertension: Secondary | ICD-10-CM | POA: Diagnosis present

## 2017-02-11 DIAGNOSIS — E785 Hyperlipidemia, unspecified: Secondary | ICD-10-CM | POA: Diagnosis present

## 2017-02-11 DIAGNOSIS — Z888 Allergy status to other drugs, medicaments and biological substances status: Secondary | ICD-10-CM

## 2017-02-11 DIAGNOSIS — G9341 Metabolic encephalopathy: Secondary | ICD-10-CM | POA: Diagnosis not present

## 2017-02-11 DIAGNOSIS — G8911 Acute pain due to trauma: Secondary | ICD-10-CM | POA: Diagnosis not present

## 2017-02-11 DIAGNOSIS — R2689 Other abnormalities of gait and mobility: Secondary | ICD-10-CM | POA: Diagnosis not present

## 2017-02-11 DIAGNOSIS — M1990 Unspecified osteoarthritis, unspecified site: Secondary | ICD-10-CM | POA: Diagnosis present

## 2017-02-11 DIAGNOSIS — R748 Abnormal levels of other serum enzymes: Secondary | ICD-10-CM | POA: Diagnosis not present

## 2017-02-11 DIAGNOSIS — Z7901 Long term (current) use of anticoagulants: Secondary | ICD-10-CM

## 2017-02-11 DIAGNOSIS — N390 Urinary tract infection, site not specified: Secondary | ICD-10-CM

## 2017-02-11 DIAGNOSIS — M542 Cervicalgia: Secondary | ICD-10-CM | POA: Diagnosis not present

## 2017-02-11 DIAGNOSIS — R278 Other lack of coordination: Secondary | ICD-10-CM | POA: Diagnosis not present

## 2017-02-11 DIAGNOSIS — W06XXXA Fall from bed, initial encounter: Secondary | ICD-10-CM | POA: Diagnosis present

## 2017-02-11 DIAGNOSIS — Z79899 Other long term (current) drug therapy: Secondary | ICD-10-CM | POA: Diagnosis not present

## 2017-02-11 DIAGNOSIS — G20 Parkinson's disease: Secondary | ICD-10-CM | POA: Diagnosis not present

## 2017-02-11 DIAGNOSIS — Z79891 Long term (current) use of opiate analgesic: Secondary | ICD-10-CM

## 2017-02-11 DIAGNOSIS — M6281 Muscle weakness (generalized): Secondary | ICD-10-CM | POA: Diagnosis not present

## 2017-02-11 DIAGNOSIS — Z87891 Personal history of nicotine dependence: Secondary | ICD-10-CM | POA: Diagnosis not present

## 2017-02-11 DIAGNOSIS — Z1612 Extended spectrum beta lactamase (ESBL) resistance: Secondary | ICD-10-CM | POA: Diagnosis present

## 2017-02-11 DIAGNOSIS — R488 Other symbolic dysfunctions: Secondary | ICD-10-CM | POA: Diagnosis not present

## 2017-02-11 DIAGNOSIS — E876 Hypokalemia: Secondary | ICD-10-CM | POA: Diagnosis present

## 2017-02-11 DIAGNOSIS — I2721 Secondary pulmonary arterial hypertension: Secondary | ICD-10-CM | POA: Diagnosis present

## 2017-02-11 DIAGNOSIS — Z86711 Personal history of pulmonary embolism: Secondary | ICD-10-CM | POA: Diagnosis not present

## 2017-02-11 DIAGNOSIS — S0990XA Unspecified injury of head, initial encounter: Secondary | ICD-10-CM | POA: Diagnosis not present

## 2017-02-11 DIAGNOSIS — M6282 Rhabdomyolysis: Secondary | ICD-10-CM | POA: Diagnosis present

## 2017-02-11 DIAGNOSIS — Z66 Do not resuscitate: Secondary | ICD-10-CM | POA: Diagnosis present

## 2017-02-11 DIAGNOSIS — F5104 Psychophysiologic insomnia: Secondary | ICD-10-CM | POA: Diagnosis present

## 2017-02-11 DIAGNOSIS — Z96653 Presence of artificial knee joint, bilateral: Secondary | ICD-10-CM | POA: Diagnosis present

## 2017-02-11 DIAGNOSIS — R778 Other specified abnormalities of plasma proteins: Secondary | ICD-10-CM | POA: Diagnosis present

## 2017-02-11 DIAGNOSIS — A4151 Sepsis due to Escherichia coli [E. coli]: Secondary | ICD-10-CM | POA: Diagnosis present

## 2017-02-11 DIAGNOSIS — N179 Acute kidney failure, unspecified: Secondary | ICD-10-CM | POA: Diagnosis not present

## 2017-02-11 DIAGNOSIS — R4182 Altered mental status, unspecified: Secondary | ICD-10-CM

## 2017-02-11 DIAGNOSIS — Z823 Family history of stroke: Secondary | ICD-10-CM

## 2017-02-11 DIAGNOSIS — A419 Sepsis, unspecified organism: Secondary | ICD-10-CM | POA: Diagnosis not present

## 2017-02-11 DIAGNOSIS — Y92129 Unspecified place in nursing home as the place of occurrence of the external cause: Secondary | ICD-10-CM

## 2017-02-11 DIAGNOSIS — E039 Hypothyroidism, unspecified: Secondary | ICD-10-CM | POA: Diagnosis present

## 2017-02-11 DIAGNOSIS — Z9841 Cataract extraction status, right eye: Secondary | ICD-10-CM

## 2017-02-11 DIAGNOSIS — E890 Postprocedural hypothyroidism: Secondary | ICD-10-CM | POA: Diagnosis present

## 2017-02-11 DIAGNOSIS — Z90710 Acquired absence of both cervix and uterus: Secondary | ICD-10-CM

## 2017-02-11 DIAGNOSIS — Z9049 Acquired absence of other specified parts of digestive tract: Secondary | ICD-10-CM

## 2017-02-11 DIAGNOSIS — M549 Dorsalgia, unspecified: Secondary | ICD-10-CM | POA: Diagnosis not present

## 2017-02-11 DIAGNOSIS — Z79890 Hormone replacement therapy (postmenopausal): Secondary | ICD-10-CM

## 2017-02-11 DIAGNOSIS — M797 Fibromyalgia: Secondary | ICD-10-CM | POA: Diagnosis present

## 2017-02-11 DIAGNOSIS — Z9842 Cataract extraction status, left eye: Secondary | ICD-10-CM

## 2017-02-11 DIAGNOSIS — R7989 Other specified abnormal findings of blood chemistry: Secondary | ICD-10-CM

## 2017-02-11 DIAGNOSIS — W19XXXD Unspecified fall, subsequent encounter: Secondary | ICD-10-CM | POA: Diagnosis not present

## 2017-02-11 DIAGNOSIS — E7800 Pure hypercholesterolemia, unspecified: Secondary | ICD-10-CM | POA: Diagnosis present

## 2017-02-11 DIAGNOSIS — R6520 Severe sepsis without septic shock: Secondary | ICD-10-CM | POA: Diagnosis not present

## 2017-02-11 DIAGNOSIS — R41841 Cognitive communication deficit: Secondary | ICD-10-CM | POA: Diagnosis not present

## 2017-02-11 DIAGNOSIS — S299XXA Unspecified injury of thorax, initial encounter: Secondary | ICD-10-CM | POA: Diagnosis not present

## 2017-02-11 DIAGNOSIS — I739 Peripheral vascular disease, unspecified: Secondary | ICD-10-CM | POA: Diagnosis present

## 2017-02-11 DIAGNOSIS — S199XXA Unspecified injury of neck, initial encounter: Secondary | ICD-10-CM | POA: Diagnosis not present

## 2017-02-11 LAB — URINALYSIS, ROUTINE W REFLEX MICROSCOPIC
GLUCOSE, UA: NEGATIVE mg/dL
KETONES UR: 15 mg/dL — AB
NITRITE: POSITIVE — AB
PROTEIN: 100 mg/dL — AB
Specific Gravity, Urine: 1.025 (ref 1.005–1.030)
pH: 6 (ref 5.0–8.0)

## 2017-02-11 LAB — CBC WITH DIFFERENTIAL/PLATELET
BASOS ABS: 0 10*3/uL (ref 0.0–0.1)
BASOS PCT: 0 %
Eosinophils Absolute: 0 10*3/uL (ref 0.0–0.7)
Eosinophils Relative: 0 %
HEMATOCRIT: 42.3 % (ref 36.0–46.0)
Hemoglobin: 14.2 g/dL (ref 12.0–15.0)
LYMPHS ABS: 1.3 10*3/uL (ref 0.7–4.0)
Lymphocytes Relative: 6 %
MCH: 28.8 pg (ref 26.0–34.0)
MCHC: 33.6 g/dL (ref 30.0–36.0)
MCV: 85.8 fL (ref 78.0–100.0)
MONOS PCT: 8 %
Monocytes Absolute: 1.7 10*3/uL — ABNORMAL HIGH (ref 0.1–1.0)
NEUTROS ABS: 18.2 10*3/uL — AB (ref 1.7–7.7)
Neutrophils Relative %: 86 %
Platelets: 253 10*3/uL (ref 150–400)
RBC: 4.93 MIL/uL (ref 3.87–5.11)
RDW: 13.7 % (ref 11.5–15.5)
WBC: 21.2 10*3/uL — ABNORMAL HIGH (ref 4.0–10.5)

## 2017-02-11 LAB — COMPREHENSIVE METABOLIC PANEL
ALBUMIN: 2.9 g/dL — AB (ref 3.5–5.0)
ALT: 25 U/L (ref 14–54)
AST: 24 U/L (ref 15–41)
Alkaline Phosphatase: 127 U/L — ABNORMAL HIGH (ref 38–126)
Anion gap: 8 (ref 5–15)
BILIRUBIN TOTAL: 1.3 mg/dL — AB (ref 0.3–1.2)
BUN: 26 mg/dL — AB (ref 6–20)
CHLORIDE: 101 mmol/L (ref 101–111)
CO2: 26 mmol/L (ref 22–32)
Calcium: 8.4 mg/dL — ABNORMAL LOW (ref 8.9–10.3)
Creatinine, Ser: 1.02 mg/dL — ABNORMAL HIGH (ref 0.44–1.00)
GFR calc Af Amer: >60 mL/min (ref >60)
GFR calc non Af Amer: 53 mL/min — ABNORMAL LOW (ref >60)
GLUCOSE: 166 mg/dL — AB (ref 65–99)
POTASSIUM: 3.8 mmol/L (ref 3.5–5.1)
SODIUM: 135 mmol/L (ref 135–145)
TOTAL PROTEIN: 6.2 g/dL — AB (ref 6.5–8.1)

## 2017-02-11 LAB — TROPONIN I: Troponin I: 0.05 ng/mL (ref <0.03)

## 2017-02-11 LAB — URINALYSIS, MICROSCOPIC (REFLEX)

## 2017-02-11 LAB — I-STAT CG4 LACTIC ACID, ED
LACTIC ACID, VENOUS: 1.19 mmol/L (ref 0.5–1.9)
Lactic Acid, Venous: 2.06 mmol/L (ref 0.5–1.9)

## 2017-02-11 LAB — OCCULT BLOOD X 1 CARD TO LAB, STOOL: Fecal Occult Bld: NEGATIVE

## 2017-02-11 LAB — PROTIME-INR
INR: 2.06
Prothrombin Time: 23 seconds — ABNORMAL HIGH (ref 11.4–15.2)

## 2017-02-11 LAB — CK: Total CK: 33 U/L — ABNORMAL LOW (ref 38–234)

## 2017-02-11 MED ORDER — CEFTRIAXONE SODIUM 1 G IJ SOLR
1.0000 g | Freq: Once | INTRAMUSCULAR | Status: AC
  Administered 2017-02-11: 1 g via INTRAVENOUS
  Filled 2017-02-11: qty 10

## 2017-02-11 MED ORDER — ACETAMINOPHEN 325 MG PO TABS
650.0000 mg | ORAL_TABLET | Freq: Once | ORAL | Status: AC
  Administered 2017-02-11: 650 mg via ORAL
  Filled 2017-02-11: qty 2

## 2017-02-11 MED ORDER — SODIUM CHLORIDE 0.9 % IV BOLUS (SEPSIS)
1000.0000 mL | Freq: Once | INTRAVENOUS | Status: AC
  Administered 2017-02-11: 1000 mL via INTRAVENOUS

## 2017-02-11 MED ORDER — SODIUM CHLORIDE 0.9 % IV SOLN
1.0000 g | Freq: Once | INTRAVENOUS | Status: AC
  Administered 2017-02-11: 1 g via INTRAVENOUS
  Filled 2017-02-11: qty 1

## 2017-02-11 NOTE — ED Notes (Signed)
Repeat lactate drawn and sent. [facility] at [facility]

## 2017-02-11 NOTE — ED Notes (Signed)
No changes. Alert, NAD, calm, interactive, resps e/u, speaking in clear complete sentences, no dyspnea noted, skin W&D, VSS, c/o neck pain, (denies: sob, nausea, dizziness or visual changes). Pt to radiology via stretcher. ABT & IVF infusing.

## 2017-02-11 NOTE — ED Notes (Signed)
Dr. Ralene Bathe aware that troponin os 0.05

## 2017-02-11 NOTE — ED Notes (Signed)
Resting, NAD, calm, interactive, resps e/u, no dyspnea, no changes.

## 2017-02-11 NOTE — ED Provider Notes (Signed)
[place] EMERGENCY DEPARTMENT Provider Note   CSN: [id number] Arrival date & time: 02/11/17  2000     History   Chief Complaint Chief Complaint  Patient presents with  . Fall    HPI Brynnlie Unterreiner is a 71 y.o. female.  The history is provided by the EMS personnel and a relative. No language interpreter was used.  Fall    Signora Zucco is a 71 y.o. female who presents to the Emergency Department complaining of fall.  Level V caveat due to altered mental status.  History is provided by EMS and the patient's daughter.  She is a resident of independent living at [facility] and was noted to be confused and not quite herself yesterday.  He was not answering calls from family today and when staff checked on her she was found on the floor near the bed.  Patient told EMS that she had rolled out of bed.  Family states that staff had checked on her earlier in the day but she does have an unknown downtime.  Family reports strong urine smell in the apartment for at least today.    Patient reports head and neck pain.  Daughter reports that at baseline the patient is mildly forgetful but is able to pay her bills and do online checking without assistance.  She communicates well and ambulates with a walker.  She does have a history of recurrent urosepsis.    Past Medical History:  Diagnosis Date  . Arthritis   . Chronic insomnia   . Complication of anesthesia    severe itching night of surgery requiring meds- also couldnt swallow- throat "paralyzed""  . Fibromyalgia   . Glaucoma   . Hypercholesterolemia   . Hyperlipidemia   . Hypertension    PCP Dr Cari Caraway- [facility]  05/15/11 with clearance and note on chart,    chest x ray, EKG 12/12 EPIC, eccho 7/11 EPIC  . Hypothyroidism    s/p graves disease  . Kidney stone   . PD (Parkinson's disease) ([place])   . Peripheral vascular disease ([place])    PULMONARY EMBOLUS x 2- 2011, 2012/ FOLLOWED BY DR [id number]-LOV 12/12 EPIC   PT STAES WILL  STOP COUMADIN 3/12 and begin LOVONOX 05/17/11 as previously instructed  . Sinus infection    at present- dr Honor Loh PA aware- was seen there and at PCP 05/10/11  . Thyroid disease    Hypothyroidism    Patient Active Problem List   Diagnosis Date Noted  . Recurrent UTI 11/23/2016  . Fall at nursing home 11/23/2016  . PD (Parkinson's disease) ([place]) 11/23/2016  . Sepsis due to urinary tract infection ([place]) 11/23/2016  . Fibromyalgia 11/23/2016  . Hypercholesterolemia 11/23/2016  . Hypertension 11/23/2016  . Hypothyroidism, adult 11/23/2016  . Elevated CK 11/23/2016  . Pulmonary HTN ([place]) 11/23/2016  . Left ventricular diastolic dysfunction 70/62/3762  . Metabolic encephalopathy 83/15/1761  . History of pulmonary embolus (PE) 2/2 hypercoagulable state 11/23/2016  . Sepsis ([place]) 09/17/2016  . Acute metabolic encephalopathy 60/73/7106  . Cellulitis of right foot 09/17/2016  . Fall   . UTI (urinary tract infection) 04/29/2016  . SIRS (systemic inflammatory response syndrome) ([place]) 04/29/2016  . Hyperlipidemia 04/29/2016  . Hypothyroidism 04/29/2016  . Hematuria 04/05/2016  . Cough 04/05/2016  . Benign essential HTN   . Intractable pain 12/17/2015  . Displaced fracture of fifth cervical vertebra ([place]) 12/17/2015  . Gait disturbance 12/17/2015  . Trochanteric bursitis of both hips 09/13/2015  . Chronic low back  pain 06/07/2015  . Fibromyalgia syndrome 05/17/2015  . Thyroid disease 05/17/2015  . Parkinson's disease ([place]) 05/17/2015  . Hypercoagulable state ([place]) 02/07/2012  . History of pulmonary embolism 02/07/2012  . S/P right TKA 05/22/2011    Past Surgical History:  Procedure Laterality Date  . ABDOMINAL HYSTERECTOMY    . APPENDECTOMY    . CATARACT EXTRACTION Bilateral   . CHOLECYSTECTOMY    . COLONOSCOPY    . EXPLORATORY LAPAROTOMY    . JOINT REPLACEMENT     left knee  7/12  . TOTAL KNEE ARTHROPLASTY  05/22/2011   Procedure: TOTAL KNEE ARTHROPLASTY;  Surgeon: Mauri Pole, MD;  Location: [facility]S;  Service: Orthopedics;  Laterality: Right;    OB History    No data available       Home Medications    Prior to Admission medications   Medication Sig Start Date End Date Taking? Authorizing Provider  acetaminophen (TYLENOL) 500 MG tablet Take 500 mg by mouth every 6 (six) hours as needed for mild pain.    [provider]  albuterol (PROVENTIL HFA;VENTOLIN HFA) 108 (90 Base) MCG/ACT inhaler Inhale 2 puffs into the lungs every 6 (six) hours as needed for wheezing or shortness of breath. 04/07/16   Hoyt Koch, MD  atorvastatin (LIPITOR) 10 MG tablet Take 1 tablet (10 mg total) by mouth daily. 08/16/16   [name], [name], MD  Carbidopa-Levodopa ER (RYTARY) 23.75-95 MG CPCR Take 1 tablet 3 (three) times daily by mouth. 01/18/17   Tat, Eustace Quail, DO  celecoxib (CELEBREX) 100 MG capsule Take 1 capsule (100 mg total) by mouth 2 (two) times daily. 07/09/16   [name], [name], MD  cetirizine (ZYRTEC) 10 MG tablet Take 1 tablet (10 mg total) by mouth daily. 04/17/16   Shelda Pal, DO  Cholecalciferol (VITAMIN D3) 5000 UNITS CAPS Take 5,000 Units by mouth daily.     [provider]  cyanocobalamin 500 MCG tablet Take 500 mcg by mouth daily.    [provider]  cycloSPORINE (RESTASIS) 0.05 % ophthalmic emulsion Place 1 drop into both eyes 2 (two) times daily.    [provider]  DULoxetine (CYMBALTA) 60 MG capsule Take 1 capsule (60 mg total) by mouth daily. 08/31/16   [name], [name], MD  fluticasone (FLONASE) 50 MCG/ACT nasal spray Place 1 spray into both nostrils daily.    [provider]  HYDROcodone-acetaminophen (NORCO/VICODIN) 5-325 MG tablet Take 1 tablet by mouth every 6 (six) hours as needed for moderate pain or severe pain. 11/28/16   Caren Griffins, MD  magnesium oxide (MAG-OX) 400 (241.3 Mg) MG tablet Take 0.5 tablets (200 mg total) by mouth daily. 12/21/15   Robbie Lis, MD  Melatonin 5  MG TABS Take 1 tablet by mouth at bedtime as needed (for sleep).    [provider]  Menthol, Topical Analgesic, (BIOFREEZE EX) Apply 1 application topically daily as needed (for pain).    [provider]  methocarbamol (ROBAXIN) 500 MG tablet Take 500 mg by mouth every 6 (six) hours as needed for muscle spasms.    [provider]  nicotine polacrilex (NICORETTE) 2 MG gum Take 2 mg by mouth as needed for smoking cessation.    [provider]  Omega-3 Fatty Acids (FISH OIL PO) Take 1 capsule by mouth daily.    [provider]  ondansetron (ZOFRAN ODT) 4 MG disintegrating tablet Take 1 tablet (4 mg total) by mouth every 8 (eight)  hours as needed for nausea or vomiting. 06/27/16   Saguier, Percell Miller, PA-C  Oxycodone HCl 10 MG TABS Take 1/2 tablet daily as needed for neck pain. If pain persists may take another 1/2 tablet per day Patient taking differently: Take 5 mg by mouth See admin instructions. Take 5 mg daily as needed for neck pain. If pain persists may take another 5 mg per day 05/03/16   Jonetta Osgood, MD  SYNTHROID 150 MCG tablet Take 1 tablet (150 mcg total) by mouth daily before breakfast. 07/09/16   [name], [name], MD  telmisartan-hydrochlorothiazide (MICARDIS HCT) 40-12.5 MG tablet Take 1 tablet by mouth daily. 07/09/16   [name], [name], MD  warfarin (COUMADIN) 4 MG tablet Take 1 tablet (4 mg total) by mouth See admin instructions. Adjust as directed by MD Patient taking differently: Take 4 mg by mouth daily. Adjust as directed by MD 10/09/16   [name], [name], MD  zolpidem (AMBIEN) 5 MG tablet TAKE 1 TABLET BY MOUTH ONCE DAILY AT BEDTIME AS NEEDED FOR SLEEP 02/10/17   [name], [name], MD    Family History Family History  Problem Relation Age of Onset  . Prostate cancer Father   . Stroke Father     Social History Social History   Tobacco Use  . Smoking status: Former Smoker    Packs/day: 1.00    Last attempt to quit: 05/10/1989      Years since quitting: 27.7  . Smokeless tobacco: Never Used  Substance Use Topics  . Alcohol use: Yes    Alcohol/week: 0.0 oz    Comment: one drink once a month  . Drug use: No     Allergies   Crestor [rosuvastatin calcium]; Erythromycin; Gabapentin; Pregabalin; Carbidopa; Macrobid [nitrofurantoin]; and Losartan   Review of Systems Review of Systems  All other systems reviewed and are negative.    Physical Exam Updated Vital Signs BP 111/62   Pulse 99   Temp 99.1 F (37.3 C) (Oral)   Resp 18   Wt 90.3 kg (199 lb)   SpO2 94%   BMI 32.12 kg/m   Physical Exam  Constitutional: She appears well-developed and well-nourished.  HENT:  Head: Normocephalic and atraumatic.  Cardiovascular: Normal rate and regular rhythm.  No murmur heard. Pulmonary/Chest: Effort normal and breath sounds normal. No respiratory distress.  Abdominal: Soft. There is no tenderness. There is no rebound and no guarding.  Musculoskeletal: She exhibits no edema or tenderness.  No hip tenderness to palpation.  Neurological: She is alert.  Disoriented to place and time.  Moves all extremities symmetrically but weakly.  Skin: Skin is warm and dry. There is pallor.  Psychiatric: She has a normal mood and affect. Her behavior is normal.  Nursing note and vitals reviewed.    ED Treatments / Results  Labs (all labs ordered are listed, but only abnormal results are displayed) Labs Reviewed  CBC WITH DIFFERENTIAL/PLATELET - Abnormal; Notable for the following components:      Result Value   WBC 21.2 (*)    Neutro Abs 18.2 (*)    Monocytes Absolute 1.7 (*)    All other components within normal limits  URINALYSIS, ROUTINE W REFLEX MICROSCOPIC - Abnormal; Notable for the following components:   Color, Urine AMBER (*)    APPearance CLOUDY (*)    Hgb urine dipstick LARGE (*)    Bilirubin Urine SMALL (*)    Ketones, ur 15 (*)    Protein, ur 100 (*)  Nitrite POSITIVE (*)    Leukocytes, UA  MODERATE (*)    All other components within normal limits  TROPONIN I - Abnormal; Notable for the following components:   Troponin I 0.05 (*)    All other components within normal limits  URINALYSIS, MICROSCOPIC (REFLEX) - Abnormal; Notable for the following components:   Bacteria, UA MANY (*)    Squamous Epithelial / LPF 0-5 (*)    All other components within normal limits  CK - Abnormal; Notable for the following components:   Total CK 33 (*)    All other components within normal limits  COMPREHENSIVE METABOLIC PANEL - Abnormal; Notable for the following components:   Glucose, Bld 166 (*)    BUN 26 (*)    Creatinine, Ser 1.02 (*)    Calcium 8.4 (*)    Total Protein 6.2 (*)    Albumin 2.9 (*)    Alkaline Phosphatase 127 (*)    Total Bilirubin 1.3 (*)    GFR calc non Af Amer 53 (*)    All other components within normal limits  PROTIME-INR - Abnormal; Notable for the following components:   Prothrombin Time 23.0 (*)    All other components within normal limits  I-STAT CG4 LACTIC ACID, ED - Abnormal; Notable for the following components:   Lactic Acid, Venous 2.06 (*)    All other components within normal limits  CULTURE, BLOOD (ROUTINE X 2)  CULTURE, BLOOD (ROUTINE X 2)  URINE CULTURE  OCCULT BLOOD X 1 CARD TO LAB, STOOL  POC OCCULT BLOOD, ED  I-STAT CG4 LACTIC ACID, ED    EKG  EKG Interpretation  Date/Time:  Sunday February 11 2017 21:04:33 EST Ventricular Rate:  96 PR Interval:    QRS Duration: 96 QT Interval:  369 QTC Calculation: 467 R Axis:   75 Text Interpretation:  Sinus rhythm Low voltage, precordial leads Confirmed by Quintella Reichert [phone]) on 02/11/2017 10:04:55 PM       Radiology Dg Chest 1 View  Result Date: 02/11/2017 CLINICAL DATA:  Fall earlier today.  Fever. EXAM: CHEST 1 VIEW COMPARISON:  None. FINDINGS: There is mild scarring in the medial left base. There is no edema or consolidation. Heart size and pulmonary vascularity are normal. No  adenopathy. There is aortic atherosclerosis. No pneumothorax. No bone lesions are evident. IMPRESSION: Mild scarring medial left base. No edema or consolidation. Stable cardiomegaly. There is aortic atherosclerosis. Aortic Atherosclerosis (ICD10-I70.0). Electronically Signed   By: Lowella Grip III M.D.   On: 02/11/2017 20:58   Ct Head Wo Contrast  Result Date: 02/11/2017 CLINICAL DATA:  Fall, and neck pain. History of Parkinson's disease, cervical spine fracture. EXAM: CT HEAD WITHOUT CONTRAST CT CERVICAL SPINE WITHOUT CONTRAST TECHNIQUE: Multidetector CT imaging of the head and cervical spine was performed following the standard protocol without intravenous contrast. Multiplanar CT image reconstructions of the cervical spine were also generated. COMPARISON:  CT HEAD and cervical spine December 07, 2016 FINDINGS: CT HEAD FINDINGS BRAIN: No intraparenchymal hemorrhage, mass effect nor midline shift. Moderate to severe ventriculomegaly, cavum septum pellucidum. Patchy to confluent supratentorial white matter hypodensities. No acute large vascular territory infarcts. No abnormal extra-axial fluid collections. Basal cisterns are patent. VASCULAR: Mild calcific atherosclerosis of the carotid siphons. SKULL: No skull fracture. Severe LEFT temporomandibular osteoarthrosis. No significant scalp soft tissue swelling. SINUSES/ORBITS: The mastoid air-cells and included paranasal sinuses are well-aerated.The included ocular globes and orbital contents are non-suspicious. Status post bilateral ocular lens implants. OTHER: None. CT CERVICAL SPINE  FINDINGS ALIGNMENT: Straightened lordosis. Vertebral bodies in alignment. SKULL BASE AND VERTEBRAE: Old mild C5 compression fracture, as similar to prior MRI multilevel mild cervical spondylosis. Facet fusion is C2 through C6 with accessory interspinous bone C5-6. C1-2 articulation maintained with mild arthropathy. No destructive bony lesions. SOFT TISSUES AND SPINAL CANAL:  Nonacute. Mild calcific atherosclerosis. Status post thyroidectomy. Calcific atherosclerosis included aortic arch vessels. DISC LEVELS: No significant osseous canal stenosis. Severe LEFT C3-4 neural foraminal narrowing. UPPER CHEST: Lung apices are clear. OTHER: None. IMPRESSION: CT HEAD: 1. No acute intracranial process. 2. Moderate to severe parenchymal brain volume loss and moderate chronic small vessel ischemic disease. CT CERVICAL SPINE: 1. No acute fracture or malalignment. 2. Severe LEFT C3-4 neural foraminal narrowing. Electronically Signed   By: Elon Alas M.D.   On: 02/11/2017 21:09   Ct Cervical Spine Wo Contrast  Result Date: 02/11/2017 CLINICAL DATA:  Fall, and neck pain. History of Parkinson's disease, cervical spine fracture. EXAM: CT HEAD WITHOUT CONTRAST CT CERVICAL SPINE WITHOUT CONTRAST TECHNIQUE: Multidetector CT imaging of the head and cervical spine was performed following the standard protocol without intravenous contrast. Multiplanar CT image reconstructions of the cervical spine were also generated. COMPARISON:  CT HEAD and cervical spine December 07, 2016 FINDINGS: CT HEAD FINDINGS BRAIN: No intraparenchymal hemorrhage, mass effect nor midline shift. Moderate to severe ventriculomegaly, cavum septum pellucidum. Patchy to confluent supratentorial white matter hypodensities. No acute large vascular territory infarcts. No abnormal extra-axial fluid collections. Basal cisterns are patent. VASCULAR: Mild calcific atherosclerosis of the carotid siphons. SKULL: No skull fracture. Severe LEFT temporomandibular osteoarthrosis. No significant scalp soft tissue swelling. SINUSES/ORBITS: The mastoid air-cells and included paranasal sinuses are well-aerated.The included ocular globes and orbital contents are non-suspicious. Status post bilateral ocular lens implants. OTHER: None. CT CERVICAL SPINE FINDINGS ALIGNMENT: Straightened lordosis. Vertebral bodies in alignment. SKULL BASE AND  VERTEBRAE: Old mild C5 compression fracture, as similar to prior MRI multilevel mild cervical spondylosis. Facet fusion is C2 through C6 with accessory interspinous bone C5-6. C1-2 articulation maintained with mild arthropathy. No destructive bony lesions. SOFT TISSUES AND SPINAL CANAL: Nonacute. Mild calcific atherosclerosis. Status post thyroidectomy. Calcific atherosclerosis included aortic arch vessels. DISC LEVELS: No significant osseous canal stenosis. Severe LEFT C3-4 neural foraminal narrowing. UPPER CHEST: Lung apices are clear. OTHER: None. IMPRESSION: CT HEAD: 1. No acute intracranial process. 2. Moderate to severe parenchymal brain volume loss and moderate chronic small vessel ischemic disease. CT CERVICAL SPINE: 1. No acute fracture or malalignment. 2. Severe LEFT C3-4 neural foraminal narrowing. Electronically Signed   By: Elon Alas M.D.   On: 02/11/2017 21:09    Procedures Procedures (including critical care time)  Medications Ordered in ED Medications  cefTRIAXone (ROCEPHIN) 1 g in dextrose 5 % 50 mL IVPB (0 g Intravenous Stopped 02/11/17 2058)  sodium chloride 0.9 % bolus 1,000 mL (0 mLs Intravenous Stopped 02/11/17 2122)  meropenem (MERREM) 1 g in sodium chloride 0.9 % 100 mL IVPB (0 g Intravenous Stopped 02/11/17 2145)  acetaminophen (TYLENOL) tablet 650 mg (650 mg Oral Given 02/11/17 2118)     Initial Impression / Assessment and Plan / ED Course  I have reviewed the triage vital signs and the nursing notes.  Pertinent labs & imaging results that were available during my care of the patient were reviewed by me and considered in my medical decision making (see chart for details).    Patient here for evaluation of injuries in the fall with change in mental status.  She  is encephalopathic on examination with generalized weakness.  UA is consistent with UTI she was treated with antibiotics.  She was initially treated with Rocephin but on further chart review she does have a  history of ESBL.  She was given a dose of meropenem to cover for possible resistant infections.  No evidence of significant injuries secondary to her fall.  INR is therapeutic.  Patient and daughter were updated of findings of studies and recommendation for admission for further treatment and they are in agreement with treatment plan.  Discussed with hospitalist, Dr. Maudie Mercury, at [facility] who agrees to accept patient in transfer.  Final Clinical Impressions(s) / ED Diagnoses   Final diagnoses:  Fall, initial encounter  Acute UTI  Sepsis, due to unspecified organism [facility])  Altered mental status, unspecified altered mental status type    ED Discharge Orders    None       Quintella Reichert, MD 02/11/17 2303

## 2017-02-11 NOTE — ED Triage Notes (Signed)
Pt arrived via [facility]. They found pt lying on the floor for an unknown amount of time. [facility] report that pt has been confused intermittently. Pt stating she feel out of bed this AM.

## 2017-02-11 NOTE — Progress Notes (Signed)
71 yo female with hx of parkinsons, apparently more confused has Uti, and slight troponin elevation, (no chest pain)

## 2017-02-11 NOTE — ED Notes (Signed)
Patient transports to CT.

## 2017-02-11 NOTE — ED Notes (Addendum)
Report received from [place], here from the [facility] independent living, last spoke with family this am, arrives for AMS, strong urine malodor, and was on the ground. H/o urosepsis x3. BS = 138. SBP 150s, HR 103. POA notified. Pt alert, NAD, calm, interactive, resps e/u, no dyspnea. EDP into room upon arrival.

## 2017-02-12 ENCOUNTER — Other Ambulatory Visit: Payer: Self-pay

## 2017-02-12 DIAGNOSIS — R778 Other specified abnormalities of plasma proteins: Secondary | ICD-10-CM | POA: Diagnosis present

## 2017-02-12 DIAGNOSIS — G9341 Metabolic encephalopathy: Secondary | ICD-10-CM

## 2017-02-12 DIAGNOSIS — N390 Urinary tract infection, site not specified: Secondary | ICD-10-CM

## 2017-02-12 DIAGNOSIS — N179 Acute kidney failure, unspecified: Secondary | ICD-10-CM

## 2017-02-12 DIAGNOSIS — A419 Sepsis, unspecified organism: Secondary | ICD-10-CM | POA: Diagnosis present

## 2017-02-12 DIAGNOSIS — R7989 Other specified abnormal findings of blood chemistry: Secondary | ICD-10-CM | POA: Diagnosis present

## 2017-02-12 HISTORY — DX: Acute kidney failure, unspecified: N17.9

## 2017-02-12 LAB — TROPONIN I
Troponin I: <0.03 ng/mL (ref <0.03)
Troponin I: <0.03 ng/mL (ref <0.03)

## 2017-02-12 LAB — BASIC METABOLIC PANEL
Anion gap: 13 (ref 5–15)
BUN: 27 mg/dL — AB (ref 6–20)
CHLORIDE: 100 mmol/L — AB (ref 101–111)
CO2: 22 mmol/L (ref 22–32)
Calcium: 8.9 mg/dL (ref 8.9–10.3)
Creatinine, Ser: 0.84 mg/dL (ref 0.44–1.00)
GFR calc Af Amer: >60 mL/min (ref >60)
GLUCOSE: 143 mg/dL — AB (ref 65–99)
POTASSIUM: 3.5 mmol/L (ref 3.5–5.1)
Sodium: 135 mmol/L (ref 135–145)

## 2017-02-12 LAB — PROTIME-INR
INR: 1.74
Prothrombin Time: 20.2 seconds — ABNORMAL HIGH (ref 11.4–15.2)

## 2017-02-12 LAB — CBC
HEMATOCRIT: 41.3 % (ref 36.0–46.0)
Hemoglobin: 13.8 g/dL (ref 12.0–15.0)
MCH: 28.9 pg (ref 26.0–34.0)
MCHC: 33.4 g/dL (ref 30.0–36.0)
MCV: 86.4 fL (ref 78.0–100.0)
PLATELETS: 211 10*3/uL (ref 150–400)
RBC: 4.78 MIL/uL (ref 3.87–5.11)
RDW: 13.8 % (ref 11.5–15.5)
WBC: 20.2 10*3/uL — ABNORMAL HIGH (ref 4.0–10.5)

## 2017-02-12 LAB — MRSA PCR SCREENING: MRSA BY PCR: NEGATIVE

## 2017-02-12 LAB — TSH: TSH: 0.847 u[IU]/mL (ref 0.350–4.500)

## 2017-02-12 MED ORDER — SODIUM CHLORIDE 0.9 % IV SOLN
INTRAVENOUS | Status: DC
  Administered 2017-02-12 – 2017-02-14 (×7): via INTRAVENOUS

## 2017-02-12 MED ORDER — WARFARIN SODIUM 4 MG PO TABS
4.0000 mg | ORAL_TABLET | Freq: Once | ORAL | Status: AC
  Administered 2017-02-12: 4 mg via ORAL
  Filled 2017-02-12: qty 1

## 2017-02-12 MED ORDER — ACETAMINOPHEN 650 MG RE SUPP: 650.0000 mg | Freq: Four times a day (QID) | RECTAL | Status: DC | PRN

## 2017-02-12 MED ORDER — WARFARIN - PHARMACIST DOSING INPATIENT: Freq: Every day | Status: DC

## 2017-02-12 MED ORDER — CYCLOSPORINE 0.05 % OP EMUL
1.0000 [drp] | Freq: Two times a day (BID) | OPHTHALMIC | Status: DC
  Administered 2017-02-12 – 2017-02-15 (×7): 1 [drp] via OPHTHALMIC
  Filled 2017-02-12 (×7): qty 1

## 2017-02-12 MED ORDER — VITAMIN D 1000 UNITS PO TABS
5000.0000 [IU] | ORAL_TABLET | Freq: Every day | ORAL | Status: DC
  Administered 2017-02-12 – 2017-02-15 (×4): 5000 [IU] via ORAL
  Filled 2017-02-12 (×4): qty 5

## 2017-02-12 MED ORDER — IPRATROPIUM-ALBUTEROL 0.5-2.5 (3) MG/3ML IN SOLN: 3.0000 mL | Freq: Four times a day (QID) | RESPIRATORY_TRACT | Status: DC | PRN

## 2017-02-12 MED ORDER — SODIUM CHLORIDE 0.9 % IV SOLN
1.0000 g | Freq: Three times a day (TID) | INTRAVENOUS | Status: DC
  Administered 2017-02-12 – 2017-02-15 (×10): 1 g via INTRAVENOUS
  Filled 2017-02-12 (×12): qty 1

## 2017-02-12 MED ORDER — ACETAMINOPHEN 325 MG PO TABS
650.0000 mg | ORAL_TABLET | Freq: Four times a day (QID) | ORAL | Status: DC | PRN
  Administered 2017-02-12 – 2017-02-14 (×3): 650 mg via ORAL
  Filled 2017-02-12 (×3): qty 2

## 2017-02-12 MED ORDER — LORATADINE 10 MG PO TABS
10.0000 mg | ORAL_TABLET | Freq: Every day | ORAL | Status: DC
  Administered 2017-02-12 – 2017-02-15 (×4): 10 mg via ORAL
  Filled 2017-02-12 (×4): qty 1

## 2017-02-12 MED ORDER — WARFARIN SODIUM 4 MG PO TABS: 4.0000 mg | ORAL_TABLET | Freq: Every day | ORAL | Status: DC

## 2017-02-12 MED ORDER — ATORVASTATIN CALCIUM 10 MG PO TABS
10.0000 mg | ORAL_TABLET | Freq: Every day | ORAL | Status: DC
  Administered 2017-02-12 – 2017-02-15 (×4): 10 mg via ORAL
  Filled 2017-02-12 (×4): qty 1

## 2017-02-12 MED ORDER — LEVOTHYROXINE SODIUM 50 MCG PO TABS
150.0000 ug | ORAL_TABLET | Freq: Every day | ORAL | Status: DC
  Administered 2017-02-12 – 2017-02-15 (×4): 150 ug via ORAL
  Filled 2017-02-12 (×4): qty 1

## 2017-02-12 MED ORDER — ONDANSETRON HCL 4 MG/2ML IJ SOLN: 4.0000 mg | Freq: Four times a day (QID) | INTRAMUSCULAR | Status: DC | PRN

## 2017-02-12 MED ORDER — FLUTICASONE PROPIONATE 50 MCG/ACT NA SUSP
1.0000 | Freq: Every day | NASAL | Status: DC
  Administered 2017-02-12 – 2017-02-15 (×4): 1 via NASAL
  Filled 2017-02-12: qty 16

## 2017-02-12 MED ORDER — CARBIDOPA-LEVODOPA ER 25-100 MG PO TBCR
1.0000 | EXTENDED_RELEASE_TABLET | Freq: Three times a day (TID) | ORAL | Status: DC
  Administered 2017-02-12 – 2017-02-15 (×11): 1 via ORAL
  Filled 2017-02-12 (×13): qty 1

## 2017-02-12 MED ORDER — ONDANSETRON HCL 4 MG PO TABS: 4.0000 mg | ORAL_TABLET | Freq: Four times a day (QID) | ORAL | Status: DC | PRN

## 2017-02-12 NOTE — Progress Notes (Signed)
Pharmacy Antibiotic Note  [name] [name] is a 71 y.o. female admitted on 02/11/2017 with UTI, hx of ESBL.  Pharmacy has been consulted for Meropenem dosing.  Plan: Meropenem 1gm iv q8hr  Height: 5' 6.5" (168.9 cm) Weight: 199 lb 4.7 oz (90.4 kg) IBW/kg (Calculated) : 60.45  Temp (24hrs), Avg:99.6 F (37.6 C), Min:98.3 F (36.8 C), Max:101.4 F (38.6 C)  Recent Labs  Lab 02/11/17 2035 02/11/17 2042 02/11/17 2105 02/11/17 2257 02/12/17 0159  WBC 21.2*  --   --   --  20.2*  CREATININE  --   --  1.02*  --  0.84  LATICACIDVEN  --  2.06*  --  1.19  --     Estimated Creatinine Clearance: 68.3 mL/min (by C-G formula based on SCr of 0.84 mg/dL).    Allergies  Allergen Reactions  . Crestor [Rosuvastatin Calcium] Other (See Comments)    Feeling very bad, aching all over.  . Erythromycin Hives  . Gabapentin Other (See Comments)    Blurred vision  . Pregabalin Other (See Comments)    Crossed vision.  . Carbidopa Other (See Comments)    Headaches, nausea, dizziness  . Macrobid [Nitrofurantoin] Hives  . Losartan Itching    Antimicrobials this admission: Ceftriaxone 02/11/2017 x1 Meropenem 02/11/2017 >>  Dose adjustments this admission: -  Microbiology results: pending  Thank you for allowing pharmacy to be a part of this patient's care.  [name] [name] 02/12/2017 4:20 AM

## 2017-02-12 NOTE — H&P (Addendum)
History and Physical    [name] [name] BZJ:[id number] DOB: Dec 31, 1945 DOA: 02/11/2017  Referring MD/NP/PA: Dr. Ralene Bathe PCP: Lorelei Pont, [name], MD  Patient coming from: [facility] independent living to [facility]   Chief Complaint:Fall  I have personally briefly reviewed patient's old medical records in [place]   HPI: [name] [name] is a 71 y.o. female with medical history significant of HTN, HLD, UTIs, history of ESBL E. Coli, Parkinson's disease, hypothyroidism, fibromyalgia; who presents after having a fall.  History is obtained from the patient and review of records.  Patient notes that she fell out of bed and was unable to get up.  Unclear how long the patient was down. She is a resident of the independent living part of [facility] and normally gets around with the use of a walker.  Family became concerned when the patient did not answer phone calls and when staff checked on her today found her on the floor near her bed confused.  EMS was called immediately.  She was noted to have fallen previously back in September for ESBL E. coli UTI with bacteremia.  Patient currently denies any complaints except for falling out of bed.  She reports taking all of her medications as prescribed.  ED Course: Upon admission into the emergency department patient was noted to be febrile up to 101.4 F, pulse 80-103, respirations 17-27, blood pressure 103/47-132/78, and O2 saturations 93-97%.  WBC was elevated at 21.2, BUN 26, creatinine 1.02, troponin 0.05, and Ck 22.  Urinalysis was positive for nitrites, moderate leukocytes, many bacteria, and TNTC WBCs.  CT scan of the brain showed no acute abnormalities.  Chest x-ray showed some scarring with stable cardiomegaly, but no acute infiltrate. Sepsis protocol has been initiated and patient received empiric antibiotics of ceftriaxone and meropenem given previous history.  Review of Systems  Unable to perform ROS: Mental status change  Musculoskeletal: Positive  for falls.  All other systems reviewed and are negative.   Past Medical History:  Diagnosis Date  . Arthritis   . Chronic insomnia   . Complication of anesthesia    severe itching night of surgery requiring meds- also couldnt swallow- throat "paralyzed""  . Fibromyalgia   . Glaucoma   . Hypercholesterolemia   . Hyperlipidemia   . Hypertension    PCP Dr Cari Caraway- [place]  05/15/11 with clearance and note on chart,    chest x ray, EKG 12/12 EPIC, eccho 7/11 EPIC  . Hypothyroidism    s/p graves disease  . Kidney stone   . PD (Parkinson's disease) ([place])   . Peripheral vascular disease ([place])    PULMONARY EMBOLUS x 2- 2011, 2012/ FOLLOWED BY DR ODOGWU-LOV 12/12 EPIC   PT STAES WILL STOP COUMADIN 3/12 and begin LOVONOX 05/17/11 as previously instructed  . Sinus infection    at present- dr Honor Loh PA aware- was seen there and at PCP 05/10/11  . Thyroid disease    Hypothyroidism    Past Surgical History:  Procedure Laterality Date  . ABDOMINAL HYSTERECTOMY    . APPENDECTOMY    . CATARACT EXTRACTION Bilateral   . CHOLECYSTECTOMY    . COLONOSCOPY    . EXPLORATORY LAPAROTOMY    . JOINT REPLACEMENT     left knee  7/12  . TOTAL KNEE ARTHROPLASTY  05/22/2011   Procedure: TOTAL KNEE ARTHROPLASTY;  Surgeon: Mauri Pole, MD;  Location: [facility] ORS;  Service: Orthopedics;  Laterality: Right;     reports that she quit smoking about 27  years ago. She smoked 1.00 pack per day. she has never used smokeless tobacco. She reports that she drinks alcohol. She reports that she does not use drugs.  Allergies  Allergen Reactions  . Crestor [Rosuvastatin Calcium] Other (See Comments)    Feeling very bad, aching all over.  . Erythromycin Hives  . Gabapentin Other (See Comments)    Blurred vision  . Pregabalin Other (See Comments)    Crossed vision.  . Carbidopa Other (See Comments)    Headaches, nausea, dizziness  . Macrobid [Nitrofurantoin] Hives  . Losartan Itching    Family History  Problem  Relation Age of Onset  . Prostate cancer Father   . Stroke Father     Prior to Admission medications   Medication Sig Start Date End Date Taking? Authorizing Provider  acetaminophen (TYLENOL) 500 MG tablet Take 500 mg by mouth every 6 (six) hours as needed for mild pain.    [provider]  albuterol (PROVENTIL HFA;VENTOLIN HFA) 108 (90 Base) MCG/ACT inhaler Inhale 2 puffs into the lungs every 6 (six) hours as needed for wheezing or shortness of breath. 04/07/16   Hoyt Koch, MD  atorvastatin (LIPITOR) 10 MG tablet Take 1 tablet (10 mg total) by mouth daily. 08/16/16   [name], [name], MD  Carbidopa-Levodopa ER (RYTARY) 23.75-95 MG CPCR Take 1 tablet 3 (three) times daily by mouth. 01/18/17   Tat, Eustace Quail, DO  celecoxib (CELEBREX) 100 MG capsule Take 1 capsule (100 mg total) by mouth 2 (two) times daily. 07/09/16   [name], [name], MD  cetirizine (ZYRTEC) 10 MG tablet Take 1 tablet (10 mg total) by mouth daily. 04/17/16   Shelda Pal, DO  Cholecalciferol (VITAMIN D3) 5000 UNITS CAPS Take 5,000 Units by mouth daily.     [provider]  cyanocobalamin 500 MCG tablet Take 500 mcg by mouth daily.    [provider]  cycloSPORINE (RESTASIS) 0.05 % ophthalmic emulsion Place 1 drop into both eyes 2 (two) times daily.    [provider]  DULoxetine (CYMBALTA) 60 MG capsule Take 1 capsule (60 mg total) by mouth daily. 08/31/16   [name], [name], MD  fluticasone (FLONASE) 50 MCG/ACT nasal spray Place 1 spray into both nostrils daily.    [provider]  HYDROcodone-acetaminophen (NORCO/VICODIN) 5-325 MG tablet Take 1 tablet by mouth every 6 (six) hours as needed for moderate pain or severe pain. 11/28/16   Caren Griffins, MD  magnesium oxide (MAG-OX) 400 (241.3 Mg) MG tablet Take 0.5 tablets (200 mg total) by mouth daily. 12/21/15   Robbie Lis, MD  Melatonin 5 MG TABS Take 1 tablet by mouth at bedtime as needed (for sleep).     [provider]  Menthol, Topical Analgesic, (BIOFREEZE EX) Apply 1 application topically daily as needed (for pain).    [provider]  methocarbamol (ROBAXIN) 500 MG tablet Take 500 mg by mouth every 6 (six) hours as needed for muscle spasms.    [provider]  nicotine polacrilex (NICORETTE) 2 MG gum Take 2 mg by mouth as needed for smoking cessation.    [provider]  Omega-3 Fatty Acids (FISH OIL PO) Take 1 capsule by mouth daily.    [provider]  ondansetron (ZOFRAN ODT) 4 MG disintegrating tablet Take 1 tablet (4 mg total) by mouth every 8 (eight) hours as needed for nausea or vomiting. 06/27/16   Saguier, Percell Miller, PA-C  Oxycodone HCl 10 MG TABS Take 1/2  tablet daily as needed for neck pain. If pain persists may take another 1/2 tablet per day Patient taking differently: Take 5 mg by mouth See admin instructions. Take 5 mg daily as needed for neck pain. If pain persists may take another 5 mg per day 05/03/16   Jonetta Osgood, MD  SYNTHROID 150 MCG tablet Take 1 tablet (150 mcg total) by mouth daily before breakfast. 07/09/16   [name], [name], MD  telmisartan-hydrochlorothiazide (MICARDIS HCT) 40-12.5 MG tablet Take 1 tablet by mouth daily. 07/09/16   [name], [name], MD  warfarin (COUMADIN) 4 MG tablet Take 1 tablet (4 mg total) by mouth See admin instructions. Adjust as directed by MD Patient taking differently: Take 4 mg by mouth daily. Adjust as directed by MD 10/09/16   [name], [name], MD  zolpidem (AMBIEN) 5 MG tablet TAKE 1 TABLET BY MOUTH ONCE DAILY AT BEDTIME AS NEEDED FOR SLEEP 02/10/17   [name], [name], MD    Physical Exam:  Constitutional: Elderly female who appears to be in no acute distress at this time. Vitals:   02/11/17 2200 02/11/17 2230 02/11/17 2245 02/12/17 0001  BP: (!) 114/58 110/61 111/62 (!) 103/47  Pulse: (!) 101 (!) 103 99 80  Resp: (!) 25 20 18 19   Temp:  99.1 F (37.3 C)  98.3 F (36.8 C)    TempSrc:  Oral  Oral  SpO2: 94% 93% 94% 93%  Weight:    90.4 kg (199 lb 4.7 oz)  Height:    5' 6.5" (1.689 m)   Eyes: PERRL, lids and conjunctivae normal ENMT: Mucous membranes are dry. Posterior pharynx clear of any exudate or lesions.Normal dentition.  Neck: normal, supple, no masses, no thyromegaly Respiratory: clear to auscultation bilaterally, no wheezing, no crackles. Normal respiratory effort. No accessory muscle use.  Cardiovascular: Regular rate and rhythm, no murmurs / rubs / gallops. No extremity edema. 2+ pedal pulses. No carotid bruits.  Abdomen: no tenderness, no masses palpated. No hepatosplenomegaly. Bowel sounds positive.  Musculoskeletal: no clubbing / cyanosis. No joint deformity upper and lower extremities. Good ROM, no contractures. Normal muscle tone.  Skin: no rashes, lesions, ulcers. No induration Neurologic: CN 2-12 grossly intact. Sensation intact, DTR normal. Strength 5/5 in all 4.  Psychiatric: Normal judgment and insight. Alert and oriented x 2.  Person and place. Flat affect    Labs on Admission: I have personally reviewed following labs and imaging studies  CBC: Recent Labs  Lab 02/11/17 2035  WBC 21.2*  NEUTROABS 18.2*  HGB 14.2  HCT 42.3  MCV 85.8  PLT 725   Basic Metabolic Panel: Recent Labs  Lab 02/11/17 2105  NA 135  K 3.8  CL 101  CO2 26  GLUCOSE 166*  BUN 26*  CREATININE 1.02*  CALCIUM 8.4*   GFR: Estimated Creatinine Clearance: 56.2 mL/min (A) (by C-G formula based on SCr of 1.02 mg/dL (H)). Liver Function Tests: Recent Labs  Lab 02/11/17 2105  AST 24  ALT 25  ALKPHOS 127*  BILITOT 1.3*  PROT 6.2*  ALBUMIN 2.9*   No results for input(s): LIPASE, AMYLASE in the last 168 hours. No results for input(s): AMMONIA in the last 168 hours. Coagulation Profile: Recent Labs  Lab 02/08/17 1037 02/11/17 2105  INR 2.2 2.06   Cardiac Enzymes: Recent Labs  Lab 02/11/17 2035 02/11/17 2105  CKTOTAL  --  33*  TROPONINI  0.05*  --    BNP (last 3 results) No results for input(s): PROBNP in  the last 8760 hours. HbA1C: No results for input(s): HGBA1C in the last 72 hours. CBG: No results for input(s): GLUCAP in the last 168 hours. Lipid Profile: No results for input(s): CHOL, HDL, LDLCALC, TRIG, CHOLHDL, LDLDIRECT in the last 72 hours. Thyroid Function Tests: No results for input(s): TSH, T4TOTAL, FREET4, T3FREE, THYROIDAB in the last 72 hours. Anemia Panel: No results for input(s): VITAMINB12, FOLATE, FERRITIN, TIBC, IRON, RETICCTPCT in the last 72 hours. Urine analysis:    Component Value Date/Time   COLORURINE AMBER (A) 02/11/2017 2003   APPEARANCEUR CLOUDY (A) 02/11/2017 2003   LABSPEC 1.025 02/11/2017 2003   PHURINE 6.0 02/11/2017 2003   GLUCOSEU NEGATIVE 02/11/2017 2003   HGBUR LARGE (A) 02/11/2017 2003   BILIRUBINUR SMALL (A) 02/11/2017 2003   BILIRUBINUR negative 06/27/2016 1222   KETONESUR 15 (A) 02/11/2017 2003   PROTEINUR 100 (A) 02/11/2017 2003   UROBILINOGEN negative (A) 06/27/2016 1222   UROBILINOGEN 0.2 09/07/2012 1049   NITRITE POSITIVE (A) 02/11/2017 2003   LEUKOCYTESUR MODERATE (A) 02/11/2017 2003   Sepsis Labs: No results found for this or any previous visit (from the past 240 hour(s)).   Radiological Exams on Admission: Dg Chest 1 View  Result Date: 02/11/2017 CLINICAL DATA:  Fall earlier today.  Fever. EXAM: CHEST 1 VIEW COMPARISON:  None. FINDINGS: There is mild scarring in the medial left base. There is no edema or consolidation. Heart size and pulmonary vascularity are normal. No adenopathy. There is aortic atherosclerosis. No pneumothorax. No bone lesions are evident. IMPRESSION: Mild scarring medial left base. No edema or consolidation. Stable cardiomegaly. There is aortic atherosclerosis. Aortic Atherosclerosis (ICD10-I70.0). Electronically Signed   By: Lowella Grip III M.D.   On: 02/11/2017 20:58   Ct Head Wo Contrast  Result Date: 02/11/2017 CLINICAL DATA:   Fall, and neck pain. History of Parkinson's disease, cervical spine fracture. EXAM: CT HEAD WITHOUT CONTRAST CT CERVICAL SPINE WITHOUT CONTRAST TECHNIQUE: Multidetector CT imaging of the head and cervical spine was performed following the standard protocol without intravenous contrast. Multiplanar CT image reconstructions of the cervical spine were also generated. COMPARISON:  CT HEAD and cervical spine December 07, 2016 FINDINGS: CT HEAD FINDINGS BRAIN: No intraparenchymal hemorrhage, mass effect nor midline shift. Moderate to severe ventriculomegaly, cavum septum pellucidum. Patchy to confluent supratentorial white matter hypodensities. No acute large vascular territory infarcts. No abnormal extra-axial fluid collections. Basal cisterns are patent. VASCULAR: Mild calcific atherosclerosis of the carotid siphons. SKULL: No skull fracture. Severe LEFT temporomandibular osteoarthrosis. No significant scalp soft tissue swelling. SINUSES/ORBITS: The mastoid air-cells and included paranasal sinuses are well-aerated.The included ocular globes and orbital contents are non-suspicious. Status post bilateral ocular lens implants. OTHER: None. CT CERVICAL SPINE FINDINGS ALIGNMENT: Straightened lordosis. Vertebral bodies in alignment. SKULL BASE AND VERTEBRAE: Old mild C5 compression fracture, as similar to prior MRI multilevel mild cervical spondylosis. Facet fusion is C2 through C6 with accessory interspinous bone C5-6. C1-2 articulation maintained with mild arthropathy. No destructive bony lesions. SOFT TISSUES AND SPINAL CANAL: Nonacute. Mild calcific atherosclerosis. Status post thyroidectomy. Calcific atherosclerosis included aortic arch vessels. DISC LEVELS: No significant osseous canal stenosis. Severe LEFT C3-4 neural foraminal narrowing. UPPER CHEST: Lung apices are clear. OTHER: None. IMPRESSION: CT HEAD: 1. No acute intracranial process. 2. Moderate to severe parenchymal brain volume loss and moderate chronic small  vessel ischemic disease. CT CERVICAL SPINE: 1. No acute fracture or malalignment. 2. Severe LEFT C3-4 neural foraminal narrowing. Electronically Signed   By: Thana Farr.D.  On: 02/11/2017 21:09   Ct Cervical Spine Wo Contrast  Result Date: 02/11/2017 CLINICAL DATA:  Fall, and neck pain. History of Parkinson's disease, cervical spine fracture. EXAM: CT HEAD WITHOUT CONTRAST CT CERVICAL SPINE WITHOUT CONTRAST TECHNIQUE: Multidetector CT imaging of the head and cervical spine was performed following the standard protocol without intravenous contrast. Multiplanar CT image reconstructions of the cervical spine were also generated. COMPARISON:  CT HEAD and cervical spine December 07, 2016 FINDINGS: CT HEAD FINDINGS BRAIN: No intraparenchymal hemorrhage, mass effect nor midline shift. Moderate to severe ventriculomegaly, cavum septum pellucidum. Patchy to confluent supratentorial white matter hypodensities. No acute large vascular territory infarcts. No abnormal extra-axial fluid collections. Basal cisterns are patent. VASCULAR: Mild calcific atherosclerosis of the carotid siphons. SKULL: No skull fracture. Severe LEFT temporomandibular osteoarthrosis. No significant scalp soft tissue swelling. SINUSES/ORBITS: The mastoid air-cells and included paranasal sinuses are well-aerated.The included ocular globes and orbital contents are non-suspicious. Status post bilateral ocular lens implants. OTHER: None. CT CERVICAL SPINE FINDINGS ALIGNMENT: Straightened lordosis. Vertebral bodies in alignment. SKULL BASE AND VERTEBRAE: Old mild C5 compression fracture, as similar to prior MRI multilevel mild cervical spondylosis. Facet fusion is C2 through C6 with accessory interspinous bone C5-6. C1-2 articulation maintained with mild arthropathy. No destructive bony lesions. SOFT TISSUES AND SPINAL CANAL: Nonacute. Mild calcific atherosclerosis. Status post thyroidectomy. Calcific atherosclerosis included aortic arch vessels.  DISC LEVELS: No significant osseous canal stenosis. Severe LEFT C3-4 neural foraminal narrowing. UPPER CHEST: Lung apices are clear. OTHER: None. IMPRESSION: CT HEAD: 1. No acute intracranial process. 2. Moderate to severe parenchymal brain volume loss and moderate chronic small vessel ischemic disease. CT CERVICAL SPINE: 1. No acute fracture or malalignment. 2. Severe LEFT C3-4 neural foraminal narrowing. Electronically Signed   By: Elon Alas M.D.   On: 02/11/2017 21:09    EKG: Independently reviewed.  Sinus rhythm  Assessment/Plan Sepsis 2/2 urinary tract infection: Acute.  Patient presents with fever up to 101.4 F, tachycardia, and tachypnea.  WBC was elevated at 21.2 and lactic acid noted to be reassuring at 1.19.  Urinalysis showed signs of possible infection and patient with previous history of ESBL. - Admit to telemetry bed - Follow-up blood and urine cultures - continue empiric antibiotics of ceftriaxone and meropenem  Fall at nursing home, acute encephalopathy: Acute.  Patient was down for unknown amount of time, but no signs of rhabdo.  Suspect secondary to acute infection superimposed on gait disturbance with related Parkinson's disease. - Neurochecks - Consider physical therapy  Elevated troponin: Acute.  Initial troponin noted to be 0.05.   - Trend cardiac troponins  Acute kidney injury 2/2 dehydration: Patient baseline creatinine previously 0.6- 0.72, and she presents with a creatinine of 1.02 with BUN of 26 to suggest prerenal cause of symptoms. - Continue IV fluids as tolerated - Recheck BMP in a.m. - Avoiding nephrotoxic agents  History of recurrent PEs, on chronic anticoagulation: Patient INR noted to be therapeutic at 2.06. - continue Coumadin per pharmacy  Essential hypertension/pulmonary artery hypertension/Left ventricular diastolic dysfunction: Patient presents with low normal blood pressures on admission.  Last echocardiogram possibly done sometime in 1991.   She does not show any clear signs of being fluid overloaded at this time and is likely hypovolemic. - Hold Micardis, restart when medically appropriate  Hypothyroidism - Check TSH - Continue levothyroxine  Hyperlipidemia - Continue atorvastatin  Parkinson's disease - Continue Sinemet  DVT prophylaxis: on coumadin   Code Status: DNR Family Communication: none  Disposition Plan:TBD  Consults called: None  Admission status: Inpatient  Norval Morton MD [facility] Pager [phone]   If 7PM-7AM, please contact night-coverage [email or web address] Password [email or web address]  02/12/2017, 1:20 AM

## 2017-02-12 NOTE — Progress Notes (Signed)
ANTICOAGULATION CONSULT NOTE - Initial Consult  Pharmacy Consult for warfarin Indication: pulmonary embolus  Allergies  Allergen Reactions  . Crestor [Rosuvastatin Calcium] Other (See Comments)    Feeling very bad, aching all over.  . Erythromycin Hives  . Gabapentin Other (See Comments)    Blurred vision  . Pregabalin Other (See Comments)    Crossed vision.  . Carbidopa Other (See Comments)    Headaches, nausea, dizziness  . Macrobid [Nitrofurantoin] Hives  . Losartan Itching    Patient Measurements: Height: 5' 6.5" (168.9 cm) Weight: 199 lb 4.7 oz (90.4 kg) IBW/kg (Calculated) : 60.45 Heparin Dosing Weight:   Vital Signs: Temp: 98.3 F (36.8 C) (12/10 0001) Temp Source: Oral (12/10 0001) BP: 103/47 (12/10 0001) Pulse Rate: 80 (12/10 0001)  Labs: Recent Labs    02/11/17 2035 02/11/17 2105 02/12/17 0159  HGB 14.2  --  13.8  HCT 42.3  --  41.3  PLT 253  --  211  LABPROT  --  23.0*  --   INR  --  2.06  --   CREATININE  --  1.02* 0.84  CKTOTAL  --  33*  --   TROPONINI 0.05*  --  <0.03    Estimated Creatinine Clearance: 68.3 mL/min (by C-G formula based on SCr of 0.84 mg/dL).   Medical History: Past Medical History:  Diagnosis Date  . Arthritis   . Chronic insomnia   . Complication of anesthesia    severe itching night of surgery requiring meds- also couldnt swallow- throat "paralyzed""  . Fibromyalgia   . Glaucoma   . Hypercholesterolemia   . Hyperlipidemia   . Hypertension    PCP Dr Cari Caraway- [place]  05/15/11 with clearance and note on chart,    chest x ray, EKG 12/12 EPIC, eccho 7/11 EPIC  . Hypothyroidism    s/p graves disease  . Kidney stone   . PD (Parkinson's disease) ([place])   . Peripheral vascular disease ([place])    PULMONARY EMBOLUS x 2- 2011, 2012/ FOLLOWED BY DR ODOGWU-LOV 12/12 EPIC   PT STAES WILL STOP COUMADIN 3/12 and begin LOVONOX 05/17/11 as previously instructed  . Sinus infection    at present- dr Honor Loh PA aware- was seen there and  at PCP 05/10/11  . Thyroid disease    Hypothyroidism    Medications:  Medications Prior to Admission  Medication Sig Dispense Refill Last Dose  . acetaminophen (TYLENOL) 500 MG tablet Take 500 mg by mouth every 6 (six) hours as needed for mild pain.   Taking  . albuterol (PROVENTIL HFA;VENTOLIN HFA) 108 (90 Base) MCG/ACT inhaler Inhale 2 puffs into the lungs every 6 (six) hours as needed for wheezing or shortness of breath. 1 Inhaler 0 Taking  . atorvastatin (LIPITOR) 10 MG tablet Take 1 tablet (10 mg total) by mouth daily. 90 tablet 3 Taking  . Carbidopa-Levodopa ER (RYTARY) 23.75-95 MG CPCR Take 1 tablet 3 (three) times daily by mouth. 90 capsule 0 Taking  . celecoxib (CELEBREX) 100 MG capsule Take 1 capsule (100 mg total) by mouth 2 (two) times daily. 180 capsule 3 Taking  . cetirizine (ZYRTEC) 10 MG tablet Take 1 tablet (10 mg total) by mouth daily. 30 tablet 1 Taking  . Cholecalciferol (VITAMIN D3) 5000 UNITS CAPS Take 5,000 Units by mouth daily.    Taking  . cyanocobalamin 500 MCG tablet Take 500 mcg by mouth daily.   Taking  . cycloSPORINE (RESTASIS) 0.05 % ophthalmic emulsion Place 1 drop into both eyes  2 (two) times daily.   Taking  . DULoxetine (CYMBALTA) 60 MG capsule Take 1 capsule (60 mg total) by mouth daily. 90 capsule 3 Taking  . fluticasone (FLONASE) 50 MCG/ACT nasal spray Place 1 spray into both nostrils daily.   Taking  . HYDROcodone-acetaminophen (NORCO/VICODIN) 5-325 MG tablet Take 1 tablet by mouth every 6 (six) hours as needed for moderate pain or severe pain. 10 tablet 0 Taking  . magnesium oxide (MAG-OX) 400 (241.3 Mg) MG tablet Take 0.5 tablets (200 mg total) by mouth daily. 30 tablet 0 Taking  . Melatonin 5 MG TABS Take 1 tablet by mouth at bedtime as needed (for sleep).   Taking  . Menthol, Topical Analgesic, (BIOFREEZE EX) Apply 1 application topically daily as needed (for pain).   Taking  . methocarbamol (ROBAXIN) 500 MG tablet Take 500 mg by mouth every 6 (six)  hours as needed for muscle spasms.   Taking  . nicotine polacrilex (NICORETTE) 2 MG gum Take 2 mg by mouth as needed for smoking cessation.   Taking  . Omega-3 Fatty Acids (FISH OIL PO) Take 1 capsule by mouth daily.   Taking  . ondansetron (ZOFRAN ODT) 4 MG disintegrating tablet Take 1 tablet (4 mg total) by mouth every 8 (eight) hours as needed for nausea or vomiting. 20 tablet 0 Taking  . Oxycodone HCl 10 MG TABS Take 1/2 tablet daily as needed for neck pain. If pain persists may take another 1/2 tablet per day (Patient taking differently: Take 5 mg by mouth See admin instructions. Take 5 mg daily as needed for neck pain. If pain persists may take another 5 mg per day) 5 tablet 0 Taking  . SYNTHROID 150 MCG tablet Take 1 tablet (150 mcg total) by mouth daily before breakfast. 90 tablet 3 Taking  . telmisartan-hydrochlorothiazide (MICARDIS HCT) 40-12.5 MG tablet Take 1 tablet by mouth daily. 90 tablet 3 Taking  . warfarin (COUMADIN) 4 MG tablet Take 1 tablet (4 mg total) by mouth See admin instructions. Adjust as directed by MD (Patient taking differently: Take 4 mg by mouth daily. Adjust as directed by MD) 90 tablet 3 Taking  . zolpidem (AMBIEN) 5 MG tablet TAKE 1 TABLET BY MOUTH ONCE DAILY AT BEDTIME AS NEEDED FOR SLEEP 30 tablet 2    Scheduled:  . atorvastatin  10 mg Oral Daily  . Carbidopa-Levodopa ER  1 tablet Oral Q8H  . cholecalciferol  5,000 Units Oral Daily  . cycloSPORINE  1 drop Both Eyes BID  . fluticasone  1 spray Each Nare Daily  . levothyroxine  150 mcg Oral QAC breakfast  . loratadine  10 mg Oral Daily  . Warfarin - Pharmacist Dosing Inpatient   Does not apply q1800    Assessment: Patient with INR at goal on admit.  Med rec. Not complete, however, 12/6 coumadin clinic notes shows daily warfarin dose is 4mg  and INR at goal.  However, still don't know last dose time, therefore will begin warfarin inpatient at 1800   Goal of Therapy:  INR 2-3    Plan:  Continue warfarin  4mg  po daily at 1800 Daily INR  Tyler Deis, Shea Stakes [place] 02/12/2017,4:25 AM

## 2017-02-12 NOTE — Progress Notes (Signed)
ANTICOAGULATION CONSULT NOTE - Follow Up Consult  Pharmacy Consult for warfarin Indication: pulmonary embolus  Allergies  Allergen Reactions  . Crestor [Rosuvastatin Calcium] Other (See Comments)    Feeling very bad, aching all over.  . Erythromycin Hives  . Gabapentin Other (See Comments)    Blurred vision  . Pregabalin Other (See Comments)    Crossed vision.  . Carbidopa Other (See Comments)    Headaches, nausea, dizziness  . Macrobid [Nitrofurantoin] Hives  . Losartan Itching    Patient Measurements: Height: 5' 6.5" (168.9 cm) Weight: 199 lb 1.2 oz (90.3 kg) IBW/kg (Calculated) : 60.45  Vital Signs: Temp: 97.7 F (36.5 C) (12/10 0459) Temp Source: Oral (12/10 0459) BP: 119/77 (12/10 0459) Pulse Rate: 96 (12/10 0459)  Labs: Recent Labs    02/11/17 2035 02/11/17 2105 02/12/17 0159  HGB 14.2  --  13.8  HCT 42.3  --  41.3  PLT 253  --  211  LABPROT  --  23.0*  --   INR  --  2.06  --   CREATININE  --  1.02* 0.84  CKTOTAL  --  33*  --   TROPONINI 0.05*  --  <0.03    Estimated Creatinine Clearance: 68.2 mL/min (by C-G formula based on SCr of 0.84 mg/dL).   Medications:  Scheduled:  . atorvastatin  10 mg Oral Daily  . Carbidopa-Levodopa ER  1 tablet Oral Q8H  . cholecalciferol  5,000 Units Oral Daily  . cycloSPORINE  1 drop Both Eyes BID  . fluticasone  1 spray Each Nare Daily  . levothyroxine  150 mcg Oral QAC breakfast  . loratadine  10 mg Oral Daily  . warfarin  4 mg Oral q1800  . Warfarin - Pharmacist Dosing Inpatient   Does not apply q1800   Infusions:  . sodium chloride 100 mL/hr at 02/12/17 0233  . meropenem [facility]) IV Stopped (02/12/17 1324)    Assessment: 63 yoF brought to ED on 12/9 after falling from bed at independent living facility; likely related to sepsis, UTI, and hx of Parkinson's disease.  She is on chronic warfarin anticoagulation for recurrent PE.  Pharmacy is consulted to continue warfarin dosing inpatient.  Today,  02/12/2017: INR 1.74, subtherapeutic CBC: Hgb and Plt stable/WNL Drug-drug interactions: broad spectrum antibiotics may increase INR   Goal of Therapy:  INR 2-3 Monitor platelets by anticoagulation protocol: Yes   Plan:   Warfarin 4 mg PO x1, early at 1200.  Daily PT/INR.  Monitor for signs and symptoms of bleeding.  Gretta Arab PharmD, BCPS Pager [phone] 02/12/2017 7:50 AM

## 2017-02-12 NOTE — Plan of Care (Signed)
  Progressing Education: Knowledge of General Education information will improve 02/12/2017 0022 - Progressing by Talbert Forest, RN Clinical Measurements: Respiratory complications will improve 02/12/2017 0022 - Progressing by Talbert Forest, RN Cardiovascular complication will be avoided 02/12/2017 0022 - Progressing by Talbert Forest, RN Coping: Level of anxiety will decrease 02/12/2017 0022 - Progressing by Talbert Forest, RN Elimination: Will not experience complications related to urinary retention 02/12/2017 0022 - Progressing by Talbert Forest, RN Pain Managment: General experience of comfort will improve 02/12/2017 0022 - Progressing by Talbert Forest, RN Safety: Ability to remain free from injury will improve 02/12/2017 0022 - Progressing by Talbert Forest, RN Skin Integrity: Risk for impaired skin integrity will decrease 02/12/2017 0022 - Progressing by Talbert Forest, RN Urinary Elimination: Signs and symptoms of infection will decrease 02/12/2017 0022 - Progressing by Talbert Forest, RN

## 2017-02-12 NOTE — Telephone Encounter (Signed)
Medication needs approval.Thanks. 

## 2017-02-13 DIAGNOSIS — W19XXXD Unspecified fall, subsequent encounter: Secondary | ICD-10-CM

## 2017-02-13 LAB — PROTIME-INR
INR: 1.68
PROTHROMBIN TIME: 19.6 s — AB (ref 11.4–15.2)

## 2017-02-13 MED ORDER — WARFARIN SODIUM 6 MG PO TABS
6.0000 mg | ORAL_TABLET | Freq: Once | ORAL | Status: AC
  Administered 2017-02-13: 6 mg via ORAL
  Filled 2017-02-13: qty 1

## 2017-02-13 NOTE — Progress Notes (Signed)
ANTICOAGULATION CONSULT NOTE - Follow Up Consult  Pharmacy Consult for warfarin Indication: hx pulmonary embolus  Allergies  Allergen Reactions  . Crestor [Rosuvastatin Calcium] Other (See Comments)    Feeling very bad, aching all over.  . Erythromycin Hives  . Gabapentin Other (See Comments)    Blurred vision  . Pregabalin Other (See Comments)    Crossed vision.  . Carbidopa Other (See Comments)    Headaches, nausea, dizziness  . Macrobid [Nitrofurantoin] Hives  . Losartan Itching    Patient Measurements: Height: 5' 6.5" (168.9 cm) Weight: 199 lb 4.7 oz (90.4 kg) IBW/kg (Calculated) : 60.45 Heparin Dosing Weight:   Vital Signs: Temp: 98.3 F (36.8 C) (12/11 0455) Temp Source: Oral (12/11 0455) BP: 125/61 (12/11 0455) Pulse Rate: 71 (12/11 0455)  Labs: Recent Labs    02/11/17 2035 02/11/17 2105 02/12/17 0159 02/12/17 0724 02/12/17 1307 02/13/17 0438  HGB 14.2  --  13.8  --   --   --   HCT 42.3  --  41.3  --   --   --   PLT 253  --  211  --   --   --   LABPROT  --  23.0*  --  20.2*  --  19.6*  INR  --  2.06  --  1.74  --  1.68  CREATININE  --  1.02* 0.84  --   --   --   CKTOTAL  --  33*  --   --   --   --   TROPONINI 0.05*  --  <0.03 <0.03 <0.03  --     Estimated Creatinine Clearance: 68.3 mL/min (by C-G formula based on SCr of 0.84 mg/dL).   Medications:  PTA warfarin regimen: 4mg  daily  Assessment: Patient is a 71 y.o F with hx recurrent PE on warfarin PTA, presented to the ED on 02/11/17 for AMS and s/p fall at nursing facility. Head CT with no acute findings. Warfarin resumed on admission.  Today, 02/13/2017: - INR is subtherapeutic at 1.68  - cbc stable - no bleeding documented - drug-drug intxns: being on abx can make pt more sensitive to warfarin (on merrem)  Goal of Therapy:  INR 2-3 Monitor platelets by anticoagulation protocol: Yes   Plan:  - increase warfarin dose to 6 mg PO x1 (1.5x home dose) - daily INR - monitor for s/s  bleeding  [name] [name] P 02/13/2017,12:56 PM

## 2017-02-13 NOTE — Progress Notes (Signed)
PROGRESS NOTE    [name] [name]  FTD:[id number] DOB: 08-19-1945 DOA: 02/11/2017 PCP: Darreld Mclean, MD    Brief Narrative: [name] [name] is a 71 y.o. female with medical history significant of HTN, HLD, UTIs, history of ESBL E. Coli, Parkinson's disease, hypothyroidism, fibromyalgia; who presents after having a fall.  History is obtained from the patient and review of records.  Patient notes that she fell out of bed and was unable to get up.  She is a resident of the independent living part of [facility] and normally gets around with the use of a walker. SHE has a h/o of ESBL bacteremia in the past and referred to medical service for admission for evaluation of ESBL reinfection.       Assessment & Plan:   Principal Problem:   Sepsis secondary to UTI [facility]) Active Problems:   Parkinson's disease ([place])   Benign essential HTN   Hypothyroidism   Fall   Elevated troponin   AKI (acute kidney injury) ([place])   Sepsis secondary to UTI: On admission she was febrile, tachycardic, tachypneic, with leukocytosis.  She was started on meropenam, urine cultures grew E COLI, sensitivities are pending.  Narrow the antibiotics, once the sensitivities are back.  Overnight afebrile and leukocytosis are improving.  Repeat CBC in am.  Lactic acid is 1.1   Fall at independent house;   ?mild rhabdomyolysis.  Vs from deconditioning from parkinson's disease.  Was able to move all extremities on asking.  PT evaluation.   Resume carbidopa and levodopa.    AKI: sec to dehydration:  Improved with fluids.     H/o recurrent PE's: Resume coumadin as per pharmacy. INR sub therapeutic.    Hypothyroidism: tsh wnl.  Resume synthroid.    Hypertension:  Well controlled.          DVT prophylaxis: (COUMADIN) Code Status: DNR.  Family Communication: none at bedside.  Disposition Plan:back to SNF when stable.    Consultants:   None.    Procedures: none.     Antimicrobials:meropenam since admission.    Subjective: No chest pain or sob, no headache, reports feeling weak.   Objective: Vitals:   02/12/17 1414 02/12/17 2024 02/13/17 0455 02/13/17 1518  BP: 129/66 (!) 124/57 125/61 129/61  Pulse: (!) 102 94 71 86  Resp: 18 19 20  (!) 22  Temp: 99.3 F (37.4 C) 98.7 F (37.1 C) 98.3 F (36.8 C) 97.6 F (36.4 C)  TempSrc: Oral Oral Oral Axillary  SpO2: 92% 95% 98% 95%  Weight:   90.4 kg (199 lb 4.7 oz)   Height:        Intake/Output Summary (Last 24 hours) at 02/13/2017 1816 Last data filed at 02/13/2017 1604 Gross per 24 hour  Intake 2906.67 ml  Output 2400 ml  Net 506.67 ml   Filed Weights   02/12/17 0001 02/12/17 0459 02/13/17 0455  Weight: 90.4 kg (199 lb 4.7 oz) 90.3 kg (199 lb 1.2 oz) 90.4 kg (199 lb 4.7 oz)    Examination:  General exam: Appears calm and comfortable not in any distress.  Respiratory system: Clear to auscultation. Respiratory effort normal. Cardiovascular system: S1 & S2 heard, RRR. No JVD, trace pedal edema. Gastrointestinal system: Abdomen is nondistended, soft and nontender. No organomegaly or masses felt. Normal bowel sounds heard. Central nervous system: Alert and oriented. Non focal.  Extremities: Symmetric 5 x 5 power. Skin: No rashes, lesions or ulcers Psychiatry:  Mood & affect appropriate.     Data Reviewed: I  have personally reviewed following labs and imaging studies  CBC: Recent Labs  Lab 02/11/17 2035 02/12/17 0159  WBC 21.2* 20.2*  NEUTROABS 18.2*  --   HGB 14.2 13.8  HCT 42.3 41.3  MCV 85.8 86.4  PLT 253 973   Basic Metabolic Panel: Recent Labs  Lab 02/11/17 2105 02/12/17 0159  NA 135 135  K 3.8 3.5  CL 101 100*  CO2 26 22  GLUCOSE 166* 143*  BUN 26* 27*  CREATININE 1.02* 0.84  CALCIUM 8.4* 8.9   GFR: Estimated Creatinine Clearance: 68.3 mL/min (by C-G formula based on SCr of 0.84 mg/dL). Liver Function Tests: Recent Labs  Lab 02/11/17 2105  AST 24   ALT 25  ALKPHOS 127*  BILITOT 1.3*  PROT 6.2*  ALBUMIN 2.9*   No results for input(s): LIPASE, AMYLASE in the last 168 hours. No results for input(s): AMMONIA in the last 168 hours. Coagulation Profile: Recent Labs  Lab 02/08/17 1037 02/11/17 2105 02/12/17 0724 02/13/17 0438  INR 2.2 2.06 1.74 1.68   Cardiac Enzymes: Recent Labs  Lab 02/11/17 2035 02/11/17 2105 02/12/17 0159 02/12/17 0724 02/12/17 1307  CKTOTAL  --  33*  --   --   --   TROPONINI 0.05*  --  <0.03 <0.03 <0.03   BNP (last 3 results) No results for input(s): PROBNP in the last 8760 hours. HbA1C: No results for input(s): HGBA1C in the last 72 hours. CBG: No results for input(s): GLUCAP in the last 168 hours. Lipid Profile: No results for input(s): CHOL, HDL, LDLCALC, TRIG, CHOLHDL, LDLDIRECT in the last 72 hours. Thyroid Function Tests: Recent Labs    02/12/17 0159  TSH 0.847   Anemia Panel: No results for input(s): VITAMINB12, FOLATE, FERRITIN, TIBC, IRON, RETICCTPCT in the last 72 hours. Sepsis Labs: Recent Labs  Lab 02/11/17 2042 02/11/17 2257  LATICACIDVEN 2.06* 1.19    Recent Results (from the past 240 hour(s))  Urine culture     Status: Abnormal (Preliminary result)   Collection Time: 02/11/17  8:03 PM  Result Value Ref Range Status   Specimen Description URINE, RANDOM  Final   Special Requests NONE  Final   Culture (A)  Final    >=100,000 COLONIES/mL ESCHERICHIA COLI SUSCEPTIBILITIES TO FOLLOW Performed at [facility], [address] [place], [place] [address]    Report Status PENDING  Incomplete  Blood Culture (routine x 2)     Status: None (Preliminary result)   Collection Time: 02/11/17  8:19 PM  Result Value Ref Range Status   Specimen Description BLOOD RIGHT ANTECUBITAL  Final   Special Requests   Final    BOTTLES DRAWN AEROBIC AND ANAEROBIC Blood Culture adequate volume   Culture   Final    NO GROWTH 1 DAY Performed at [facility], [place] [address]  [place], [place] [address]    Report Status PENDING  Incomplete  Blood Culture (routine x 2)     Status: None (Preliminary result)   Collection Time: 02/11/17  8:40 PM  Result Value Ref Range Status   Specimen Description BLOOD LEFT FOREARM UPPER  Final   Special Requests   Final    BOTTLES DRAWN AEROBIC AND ANAEROBIC Blood Culture adequate volume   Culture   Final    NO GROWTH 1 DAY Performed at [facility], [place] [address], [place], [place] [address]    Report Status PENDING  Incomplete  MRSA PCR Screening     Status: None  Collection Time: 02/12/17 12:14 AM  Result Value Ref Range Status   MRSA by PCR NEGATIVE NEGATIVE Final    Comment:        The GeneXpert MRSA Assay (FDA approved for NASAL specimens only), is one component of a comprehensive MRSA colonization surveillance program. It is not intended to diagnose MRSA infection nor to guide or monitor treatment for MRSA infections.          Radiology Studies: Dg Chest 1 View  Result Date: 02/11/2017 CLINICAL DATA:  Fall earlier today.  Fever. EXAM: CHEST 1 VIEW COMPARISON:  None. FINDINGS: There is mild scarring in the medial left base. There is no edema or consolidation. Heart size and pulmonary vascularity are normal. No adenopathy. There is aortic atherosclerosis. No pneumothorax. No bone lesions are evident. IMPRESSION: Mild scarring medial left base. No edema or consolidation. Stable cardiomegaly. There is aortic atherosclerosis. Aortic Atherosclerosis (ICD10-I70.0). Electronically Signed   By: Lowella Grip III M.D.   On: 02/11/2017 20:58   Ct Head Wo Contrast  Result Date: 02/11/2017 CLINICAL DATA:  Fall, and neck pain. History of Parkinson's disease, cervical spine fracture. EXAM: CT HEAD WITHOUT CONTRAST CT CERVICAL SPINE WITHOUT CONTRAST TECHNIQUE: Multidetector CT imaging of the head and cervical spine was performed following the standard protocol without intravenous contrast. Multiplanar CT image  reconstructions of the cervical spine were also generated. COMPARISON:  CT HEAD and cervical spine December 07, 2016 FINDINGS: CT HEAD FINDINGS BRAIN: No intraparenchymal hemorrhage, mass effect nor midline shift. Moderate to severe ventriculomegaly, cavum septum pellucidum. Patchy to confluent supratentorial white matter hypodensities. No acute large vascular territory infarcts. No abnormal extra-axial fluid collections. Basal cisterns are patent. VASCULAR: Mild calcific atherosclerosis of the carotid siphons. SKULL: No skull fracture. Severe LEFT temporomandibular osteoarthrosis. No significant scalp soft tissue swelling. SINUSES/ORBITS: The mastoid air-cells and included paranasal sinuses are well-aerated.The included ocular globes and orbital contents are non-suspicious. Status post bilateral ocular lens implants. OTHER: None. CT CERVICAL SPINE FINDINGS ALIGNMENT: Straightened lordosis. Vertebral bodies in alignment. SKULL BASE AND VERTEBRAE: Old mild C5 compression fracture, as similar to prior MRI multilevel mild cervical spondylosis. Facet fusion is C2 through C6 with accessory interspinous bone C5-6. C1-2 articulation maintained with mild arthropathy. No destructive bony lesions. SOFT TISSUES AND SPINAL CANAL: Nonacute. Mild calcific atherosclerosis. Status post thyroidectomy. Calcific atherosclerosis included aortic arch vessels. DISC LEVELS: No significant osseous canal stenosis. Severe LEFT C3-4 neural foraminal narrowing. UPPER CHEST: Lung apices are clear. OTHER: None. IMPRESSION: CT HEAD: 1. No acute intracranial process. 2. Moderate to severe parenchymal brain volume loss and moderate chronic small vessel ischemic disease. CT CERVICAL SPINE: 1. No acute fracture or malalignment. 2. Severe LEFT C3-4 neural foraminal narrowing. Electronically Signed   By: Elon Alas M.D.   On: 02/11/2017 21:09   Ct Cervical Spine Wo Contrast  Result Date: 02/11/2017 CLINICAL DATA:  Fall, and neck pain. History  of Parkinson's disease, cervical spine fracture. EXAM: CT HEAD WITHOUT CONTRAST CT CERVICAL SPINE WITHOUT CONTRAST TECHNIQUE: Multidetector CT imaging of the head and cervical spine was performed following the standard protocol without intravenous contrast. Multiplanar CT image reconstructions of the cervical spine were also generated. COMPARISON:  CT HEAD and cervical spine December 07, 2016 FINDINGS: CT HEAD FINDINGS BRAIN: No intraparenchymal hemorrhage, mass effect nor midline shift. Moderate to severe ventriculomegaly, cavum septum pellucidum. Patchy to confluent supratentorial white matter hypodensities. No acute large vascular territory infarcts. No abnormal extra-axial fluid collections. Basal cisterns are patent. VASCULAR: Mild calcific atherosclerosis  of the carotid siphons. SKULL: No skull fracture. Severe LEFT temporomandibular osteoarthrosis. No significant scalp soft tissue swelling. SINUSES/ORBITS: The mastoid air-cells and included paranasal sinuses are well-aerated.The included ocular globes and orbital contents are non-suspicious. Status post bilateral ocular lens implants. OTHER: None. CT CERVICAL SPINE FINDINGS ALIGNMENT: Straightened lordosis. Vertebral bodies in alignment. SKULL BASE AND VERTEBRAE: Old mild C5 compression fracture, as similar to prior MRI multilevel mild cervical spondylosis. Facet fusion is C2 through C6 with accessory interspinous bone C5-6. C1-2 articulation maintained with mild arthropathy. No destructive bony lesions. SOFT TISSUES AND SPINAL CANAL: Nonacute. Mild calcific atherosclerosis. Status post thyroidectomy. Calcific atherosclerosis included aortic arch vessels. DISC LEVELS: No significant osseous canal stenosis. Severe LEFT C3-4 neural foraminal narrowing. UPPER CHEST: Lung apices are clear. OTHER: None. IMPRESSION: CT HEAD: 1. No acute intracranial process. 2. Moderate to severe parenchymal brain volume loss and moderate chronic small vessel ischemic disease. CT  CERVICAL SPINE: 1. No acute fracture or malalignment. 2. Severe LEFT C3-4 neural foraminal narrowing. Electronically Signed   By: Elon Alas M.D.   On: 02/11/2017 21:09        Scheduled Meds: . atorvastatin  10 mg Oral Daily  . Carbidopa-Levodopa ER  1 tablet Oral Q8H  . cholecalciferol  5,000 Units Oral Daily  . cycloSPORINE  1 drop Both Eyes BID  . fluticasone  1 spray Each Nare Daily  . levothyroxine  150 mcg Oral QAC breakfast  . loratadine  10 mg Oral Daily  . Warfarin - Pharmacist Dosing Inpatient   Does not apply q1800   Continuous Infusions: . sodium chloride 100 mL/hr at 02/13/17 1732  . meropenem (MERREM) IV Stopped (02/13/17 1634)     LOS: 2 days    Time spent: 35 minutes.     Hosie Poisson, MD [facility] Pager [phone]  If 7PM-7AM, please contact night-coverage [email or web address] Password [facility] 02/13/2017, 6:16 PM

## 2017-02-14 DIAGNOSIS — A419 Sepsis, unspecified organism: Secondary | ICD-10-CM

## 2017-02-14 DIAGNOSIS — N179 Acute kidney failure, unspecified: Secondary | ICD-10-CM

## 2017-02-14 DIAGNOSIS — I10 Essential (primary) hypertension: Secondary | ICD-10-CM

## 2017-02-14 DIAGNOSIS — N390 Urinary tract infection, site not specified: Secondary | ICD-10-CM

## 2017-02-14 DIAGNOSIS — G20 Parkinson's disease: Secondary | ICD-10-CM

## 2017-02-14 DIAGNOSIS — E039 Hypothyroidism, unspecified: Secondary | ICD-10-CM

## 2017-02-14 DIAGNOSIS — R4182 Altered mental status, unspecified: Secondary | ICD-10-CM

## 2017-02-14 DIAGNOSIS — W19XXXA Unspecified fall, initial encounter: Secondary | ICD-10-CM

## 2017-02-14 LAB — CBC
HCT: 40.4 % (ref 36.0–46.0)
Hemoglobin: 12.9 g/dL (ref 12.0–15.0)
MCH: 27.9 pg (ref 26.0–34.0)
MCHC: 31.9 g/dL (ref 30.0–36.0)
MCV: 87.3 fL (ref 78.0–100.0)
PLATELETS: 241 10*3/uL (ref 150–400)
RBC: 4.63 MIL/uL (ref 3.87–5.11)
RDW: 13.7 % (ref 11.5–15.5)
WBC: 7.4 10*3/uL (ref 4.0–10.5)

## 2017-02-14 LAB — BASIC METABOLIC PANEL
Anion gap: 6 (ref 5–15)
BUN: 16 mg/dL (ref 6–20)
CO2: 25 mmol/L (ref 22–32)
Calcium: 8.6 mg/dL — ABNORMAL LOW (ref 8.9–10.3)
Chloride: 110 mmol/L (ref 101–111)
Creatinine, Ser: 0.62 mg/dL (ref 0.44–1.00)
GFR calc Af Amer: >60 mL/min (ref >60)
Glucose, Bld: 120 mg/dL — ABNORMAL HIGH (ref 65–99)
Potassium: 3.2 mmol/L — ABNORMAL LOW (ref 3.5–5.1)
SODIUM: 141 mmol/L (ref 135–145)

## 2017-02-14 LAB — URINE CULTURE

## 2017-02-14 LAB — PROTIME-INR
INR: 1.78
PROTHROMBIN TIME: 20.6 s — AB (ref 11.4–15.2)

## 2017-02-14 MED ORDER — POTASSIUM CHLORIDE CRYS ER 20 MEQ PO TBCR
40.0000 meq | EXTENDED_RELEASE_TABLET | Freq: Once | ORAL | Status: AC
Start: 2017-02-14 — End: 2017-02-14
  Administered 2017-02-14: 40 meq via ORAL
  Filled 2017-02-14: qty 2

## 2017-02-14 MED ORDER — WARFARIN SODIUM 6 MG PO TABS
6.0000 mg | ORAL_TABLET | Freq: Once | ORAL | Status: AC
  Administered 2017-02-14: 6 mg via ORAL
  Filled 2017-02-14: qty 1

## 2017-02-14 NOTE — Care Management Important Message (Addendum)
Important Message  Patient Details IM Letter given to Cookie/Case Manager to present to the Patient Name: Yalanda Soderman MRN: [id number] Date of Birth: Sep 14, 1945   Medicare Important Message Given:  Yes    Eiley, Mcginnity 02/14/2017, 11:40 AMImportant Message  Patient Details  Name: Kamori Kitchens MRN: [id number] Date of Birth: Jul 19, 1945   Medicare Important Message Given:  Yes    Lucas, Exline 02/14/2017, 11:40 AM

## 2017-02-14 NOTE — Evaluation (Signed)
Physical Therapy Evaluation Patient Details Name: [name] [name] MRN: [id number] DOB: 1946-02-17 Today's Date: 02/14/2017   History of Present Illness  71 yo female admitted with sepsis, fall at home. Hx of Parkinson's, fibromyalgia, PE, PVD, recurrent UTI.   Clinical Impression  On eval, pt required Min-Mod assist for mobility. She walked ~100 feet with a RW. LOB x 1 posteriorly with initial standing. Shuffling and festination gait patterns noted during session (hx of Parkinson's). Pt participated and tolerated activity well. However, she did require physical assistance. Pt is from an Ind Living facility. She would need to be at least supervision level assist to consider d/c back to apt alone. At this time, recommendation is for ST re[facility] at [facility]. If pt refuses SNF, recommend HHPT and 24 hour supervision initially until pt returns to baseline.     Follow Up Recommendations SNF    Equipment Recommendations  None recommended by PT    Recommendations for Other Services OT consult     Precautions / Restrictions Precautions Precautions: Fall Precaution Comments: incontinence Restrictions Weight Bearing Restrictions: No      Mobility  Bed Mobility               General bed mobility comments: oob in recliner  Transfers Overall transfer level: Needs assistance Equipment used: Rolling walker (2 wheeled) Transfers: Sit to/from Stand Sit to Stand: Mod assist         General transfer comment: Assist to rise, stabilize, control descent. LOB x 1 posteriorly. As session continued, pt progressed to Min assist. Increased time and cueing for proper hand placement required.   Ambulation/Gait Ambulation/Gait assistance: Min assist Ambulation Distance (Feet): 100 Feet Assistive device: Rolling walker (2 wheeled) Gait Pattern/deviations: Step-through pattern;Festinating;Shuffle     General Gait Details: Assist to stabilize pt throughout distance. Mod cues for pt to correct step  lengths due to shuffle pattern. Intermittent festination noted.   Stairs            Wheelchair Mobility    Modified Rankin (Stroke Patients Only)       Balance Overall balance assessment: Needs assistance         Standing balance support: Bilateral upper extremity supported Standing balance-Leahy Scale: Poor                               Pertinent Vitals/Pain Pain Assessment: No/denies pain    Home Living Family/patient expects to be discharged to:: Private residence     Type of Home: Independent living facility Home Access: Level entry     Home Layout: One level Home Equipment: Bedside commode;Walker - 4 wheels;Walker - 2 wheels      Prior Function Level of Independence: Independent with assistive device(s)         Comments: Reports she walks to dining hall with RW and manages ADLs independently     Hand Dominance        Extremity/Trunk Assessment   Upper Extremity Assessment Upper Extremity Assessment: Defer to OT evaluation    Lower Extremity Assessment Lower Extremity Assessment: Generalized weakness    Cervical / Trunk Assessment Cervical / Trunk Assessment: Normal  Communication   Communication: No difficulties  Cognition Arousal/Alertness: Awake/alert Behavior During Therapy: WFL for tasks assessed/performed Overall Cognitive Status: Within Functional Limits for tasks assessed  General Comments      Exercises     Assessment/Plan    PT Assessment Patient needs continued PT services  PT Problem List Decreased strength;Decreased mobility;Decreased activity tolerance;Decreased balance;Decreased knowledge of use of DME       PT Treatment Interventions Gait training;DME instruction;Functional mobility training;Balance training;Therapeutic activities;Patient/family education;Therapeutic exercise    PT Goals (Current goals can be found in the Care Plan section)   Acute Rehab PT Goals Patient Stated Goal: return to IND living apt PT Goal Formulation: With patient Time For Goal Achievement: 02/28/17 Potential to Achieve Goals: Good    Frequency Min 3X/week   Barriers to discharge        Co-evaluation               AM-PAC PT "6 Clicks" Daily Activity  Outcome Measure Difficulty turning over in bed (including adjusting bedclothes, sheets and blankets)?: Unable Difficulty moving from lying on back to sitting on the side of the bed? : Unable Difficulty sitting down on and standing up from a chair with arms (e.g., wheelchair, bedside commode, etc,.)?: Unable Help needed moving to and from a bed to chair (including a wheelchair)?: A Lot Help needed walking in hospital room?: A Little Help needed climbing 3-5 steps with a railing? : A Lot 6 Click Score: 10    End of Session Equipment Utilized During Treatment: Gait belt Activity Tolerance: Patient tolerated treatment well Patient left: in chair;with call bell/phone within reach;with chair alarm set   PT Visit Diagnosis: Muscle weakness (generalized) (M62.81);Difficulty in walking, not elsewhere classified (R26.2)    Time: 1962-2297 PT Time Calculation (min) (ACUTE ONLY): 28 min   Charges:   PT Evaluation $PT Eval Moderate Complexity: 1 Mod PT Treatments $Gait Training: 8-22 mins   PT G Codes:          Weston Anna, MPT Pager: [phone]

## 2017-02-14 NOTE — Clinical Social Work Note (Signed)
Clinical Social Work Assessment  Patient Details  Name: [name] [name] MRN: [id number] Date of Birth: [birth year]  Date of referral:  02/14/17               Reason for consult:  Facility Placement                Permission sought to share information with:  Family Supports Permission granted to share information::  Yes, Verbal Permission Granted  Name::     daughter [name], son Banker::     Relationship::     Contact Information:     Housing/Transportation Living arrangements for the past 2 months:  Charity fundraiser of Information:  Patient Patient Interpreter Needed:    Criminal Activity/Legal Involvement Pertinent to Current Situation/Hospitalization:  No - Comment as needed Significant Relationships:  Adult Children(grandchildren) Lives with:  Self Do you feel safe going back to the place where you live?  Yes Need for family participation in patient care:  Yes (Comment)(daughter primary decision maker)  Care giving concerns:  Pt from independent living apartment at [facility]. Uses walker at baseline. Currently needing assistance to ambulate and complete ADLs.  Reports she gets [facility] at [facility].    Social Worker assessment / plan:  CSW consulted to assist with SNF referral. Met with pt at bedside - daughter via speaker phone. Discussed recommendation for SNF with both- pt has been to rehab twice in past 1.5 years ([facility], [facility]) and feels she benefited from rehab and was able to return home. States she agrees HHPT that she is receiving would not be enough to benefit her current mobility issues.  Pt was tearful during assessment, feeling she will miss her grandson's birthday party if she goes to SNF. Daughter assured her party will be scheduled so that she can attend and pt was relieved. Pt was oriented however she was very slow to respond and had incongruent affect at times.  Completed FL2 and made referrals to area SNFs. Pt prefers [facility]  again if available, also notes that since daugher lives in [place], those are SNFs would be okay as well. Will provide bed offers.   Employment status:  Retired Forensic scientist:  Medicare PT Recommendations:  [place] / Referral to community resources:  [place]  Patient/Family's Response to care:  appreciative  Patient/Family's Understanding of and Emotional Response to Diagnosis, Current Treatment, and Prognosis:  Pt demonstrated adequate understanding after much prompting- was slow to respond. Emotionally anxious- see above. Seemed more accepting toward end of assessment. 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   Emotional Assessment Appearance:  Appears stated age Attitude/Demeanor/Rapport:  Crying(engaged) Affect (typically observed):  Anxious, Calm Orientation:  Oriented to Self, Oriented to Place, Oriented to Situation Alcohol / Substance use:  Not Applicable Psych involvement (Current and /or in the community):  No (Comment)  Discharge Needs  Concerns to be addressed:  Discharge Planning Concerns Readmission within the last 30 days:  No Current discharge risk:  Dependent with Mobility Barriers to Discharge:  Continued Medical Work up   [employer], LCSW 02/14/2017, 4:26 PM  [phone]

## 2017-02-14 NOTE — Progress Notes (Signed)
ANTICOAGULATION CONSULT NOTE - Follow Up Consult  Pharmacy Consult for warfarin Indication: hx pulmonary embolus  Allergies  Allergen Reactions  . Crestor [Rosuvastatin Calcium] Other (See Comments)    Feeling very bad, aching all over.  . Erythromycin Hives  . Gabapentin Other (See Comments)    Blurred vision  . Pregabalin Other (See Comments)    Crossed vision.  . Carbidopa Other (See Comments)    Headaches, nausea, dizziness  . Macrobid [Nitrofurantoin] Hives  . Losartan Itching    Patient Measurements: Height: 5' 6.5" (168.9 cm) Weight: 202 lb 6.1 oz (91.8 kg) IBW/kg (Calculated) : 60.45 Heparin Dosing Weight:   Vital Signs: Temp: 98 F (36.7 C) (12/12 0513) Temp Source: Oral (12/12 0513) BP: 145/72 (12/12 0513) Pulse Rate: 77 (12/12 0513)  Labs: Recent Labs    02/11/17 2035  02/11/17 2105 02/12/17 0159 02/12/17 0724 02/12/17 1307 02/13/17 0438 02/14/17 0447  HGB 14.2  --   --  13.8  --   --   --  12.9  HCT 42.3  --   --  41.3  --   --   --  40.4  PLT 253  --   --  211  --   --   --  241  LABPROT  --    < > 23.0*  --  20.2*  --  19.6* 20.6*  INR  --    < > 2.06  --  1.74  --  1.68 1.78  CREATININE  --   --  1.02* 0.84  --   --   --  0.62  CKTOTAL  --   --  33*  --   --   --   --   --   TROPONINI 0.05*  --   --  <0.03 <0.03 <0.03  --   --    < > = values in this interval not displayed.    Estimated Creatinine Clearance: 72.2 mL/min (by C-G formula based on SCr of 0.62 mg/dL).   Medications:  PTA warfarin regimen: 4mg  daily  Assessment: Patient is a 71 y.o F with hx recurrent PE on warfarin PTA, presented to the ED on 02/11/17 for AMS and s/p fall at nursing facility. Head CT with no acute findings. Warfarin resumed on admission.  Today, 02/14/2017: - INR is subtherapeutic but up from 1.68 to 1.78 today after dose increased to 6mg  yesterday - cbc relatively stable - no bleeding documented - drug-drug intxns: being on abx can make pt more sensitive  to warfarin (on merrem)  Goal of Therapy:  INR 2-3 Monitor platelets by anticoagulation protocol: Yes   Plan:  - repeat warfarin dose to 6 mg PO x1 (1.5x home dose) - daily INR - monitor for s/s bleeding  Odin Mariani P 02/14/2017,8:13 AM

## 2017-02-14 NOTE — NC FL2 (Signed)
[place] LEVEL OF CARE SCREENING TOOL     IDENTIFICATION  Patient Name: [name] [name] Birthdate: April 22, 1945 Sex: female Admission Date (Current Location): 02/11/2017  [facility] and [place] Number:  Herbalist and Address:  [facility],  [place], [place]      Provider Number: [id number]  Attending Physician Name and Address:  Patrecia Pour, MD  Relative Name and Phone Number:       Current Level of Care: Hospital Recommended Level of Care: [facility] Prior Approval Number:    Date Approved/Denied:   PASRR Number: [id number]  Discharge Plan: SNF    Current Diagnoses: Patient Active Problem List   Diagnosis Date Noted  . Sepsis secondary to UTI ([place]) 02/12/2017  . Elevated troponin 02/12/2017  . AKI (acute kidney injury) ([place]) 02/12/2017  . Recurrent UTI 11/23/2016  . Fall at nursing home 11/23/2016  . PD (Parkinson's disease) ([place]) 11/23/2016  . Sepsis due to urinary tract infection ([place]) 11/23/2016  . Fibromyalgia 11/23/2016  . Hypercholesterolemia 11/23/2016  . Hypertension 11/23/2016  . Hypothyroidism, adult 11/23/2016  . Elevated CK 11/23/2016  . Pulmonary HTN ([place]) 11/23/2016  . Left ventricular diastolic dysfunction 44/05/4740  . Metabolic encephalopathy 59/56/3875  . History of pulmonary embolus (PE) 2/2 hypercoagulable state 11/23/2016  . Sepsis ([place]) 09/17/2016  . Acute metabolic encephalopathy 64/33/2951  . Cellulitis of right foot 09/17/2016  . Fall   . UTI (urinary tract infection) 04/29/2016  . SIRS (systemic inflammatory response syndrome) ([place]) 04/29/2016  . Hyperlipidemia 04/29/2016  . Hypothyroidism 04/29/2016  . Hematuria 04/05/2016  . Cough 04/05/2016  . Benign essential HTN   . Intractable pain 12/17/2015  . Displaced fracture of fifth cervical vertebra ([place]) 12/17/2015  . Gait disturbance 12/17/2015  . Trochanteric bursitis of both hips 09/13/2015  . Chronic low back  pain 06/07/2015  . Fibromyalgia syndrome 05/17/2015  . Thyroid disease 05/17/2015  . Parkinson's disease ([place]) 05/17/2015  . Hypercoagulable state ([place]) 02/07/2012  . History of pulmonary embolism 02/07/2012  . S/P right TKA 05/22/2011    Orientation RESPIRATION BLADDER Height & Weight     Self, Time, Situation, Place  Normal Incontinent Weight: 202 lb 6.1 oz (91.8 kg) Height:  5' 6.5" (168.9 cm)  BEHAVIORAL SYMPTOMS/MOOD NEUROLOGICAL BOWEL NUTRITION STATUS      Incontinent Diet(heart healthy)  AMBULATORY STATUS COMMUNICATION OF NEEDS Skin   Limited Assist Verbally Normal                       Personal Care Assistance Level of Assistance  Bathing, Feeding, Dressing Bathing Assistance: Limited assistance Feeding assistance: Independent Dressing Assistance: Limited assistance     Functional Limitations Info  Sight, Hearing, Speech Sight Info: Adequate Hearing Info: Adequate Speech Info: Adequate    SPECIAL CARE FACTORS FREQUENCY  PT (By licensed PT), OT (By licensed OT)     PT Frequency: 5x OT Frequency: 5x            Contractures Contractures Info: Not present    Additional Factors Info  Code Status, Allergies, Isolation Precautions Code Status Info: DNR Allergies Info: Crestor Rosuvastatin Calcium, Erythromycin, Gabapentin, Pregabalin, Carbidopa, Macrobid Nitrofurantoin, Losartan     Isolation Precautions Info: contact precautions ESBL     Current Medications (02/14/2017):  This is the current hospital active medication list Current Facility-Administered Medications  Medication Dose Route Frequency Provider Last Rate Last Dose  . 0.9 %  sodium chloride infusion  Intravenous Continuous Fuller Plan A, MD 100 mL/hr at 02/14/17 0415    . acetaminophen (TYLENOL) tablet 650 mg  650 mg Oral Q6H PRN Fuller Plan A, MD   650 mg at 02/14/17 1022   Or  . acetaminophen (TYLENOL) suppository 650 mg  650 mg Rectal Q6H PRN Fuller Plan A, MD      .  atorvastatin (LIPITOR) tablet 10 mg  10 mg Oral Daily Tamala Julian, Rondell A, MD   10 mg at 02/14/17 1008  . Carbidopa-Levodopa ER (SINEMET CR) 25-100 MG tablet controlled release 1 tablet  1 tablet Oral Q8H Smith, Rondell A, MD   1 tablet at 02/14/17 1318  . cholecalciferol (VITAMIN D) tablet 5,000 Units  5,000 Units Oral Daily Fuller Plan A, MD   5,000 Units at 02/14/17 1008  . cycloSPORINE (RESTASIS) 0.05 % ophthalmic emulsion 1 drop  1 drop Both Eyes BID Fuller Plan A, MD   1 drop at 02/14/17 1008  . fluticasone (FLONASE) 50 MCG/ACT nasal spray 1 spray  1 spray Each Nare Daily Tamala Julian, Rondell A, MD   1 spray at 02/14/17 1010  . ipratropium-albuterol (DUONEB) 0.5-2.5 (3) MG/3ML nebulizer solution 3 mL  3 mL Nebulization Q6H PRN Smith, Rondell A, MD      . levothyroxine (SYNTHROID, LEVOTHROID) tablet 150 mcg  150 mcg Oral QAC breakfast Fuller Plan A, MD   150 mcg at 02/14/17 9233  . loratadine (CLARITIN) tablet 10 mg  10 mg Oral Daily Tamala Julian, Rondell A, MD   10 mg at 02/14/17 1008  . meropenem (MERREM) 1 g in sodium chloride 0.9 % 100 mL IVPB  1 g Intravenous Q8H Hosie Poisson, MD 200 mL/hr at 02/14/17 1318 1 g at 02/14/17 1318  . ondansetron (ZOFRAN) tablet 4 mg  4 mg Oral Q6H PRN Fuller Plan A, MD       Or  . ondansetron (ZOFRAN) injection 4 mg  4 mg Intravenous Q6H PRN Smith, Rondell A, MD      . warfarin (COUMADIN) tablet 6 mg  6 mg Oral ONCE-1800 Pham, Anh P, RPH      . Warfarin - Pharmacist Dosing Inpatient   Does not apply q1800 Norval Morton, MD         Discharge Medications: Please see discharge summary for a list of discharge medications.  Relevant Imaging Results:  Relevant Lab Results:   Additional Information SS#: [id number]  Nila Nephew, LCSW

## 2017-02-14 NOTE — Progress Notes (Signed)
PROGRESS NOTE  [name] [name]  BSW:[id number] DOB: [birth year] DOA: 02/11/2017 PCP: Darreld Mclean, MD  Brief Narrative: [name] [name] is a 71 y.o. female with a history of PD, recurrent ESBL UTI's, HTN, HLD, fibromyalgia, and hypothyroidism who presented to the ED after a slip and fall while exiting bed at her independent living facility. She usually gets around with a walker and was unable to get up independently. On arrival she appeared confused, febrile to 101.94F with WBC 21.2k and nitrite-positive pyuria on UA. CT head and CXR showed no acute abnormalities. Meropenem was given for UTI and she was admitted. Leukocytosis resolved and fever subsided. Mental status is clearing. Blood cultures have remained negative, though urine culture has confirmed ESBL E. coli. Meropenem is continued per ID recommendations with plans to transition to fosfomycin at discharge. PT has recommended short term rehabilitation prior to return to previous venue.   Assessment & Plan: Principal Problem:   Sepsis secondary to UTI [facility]) Active Problems:   Parkinson's disease ([place])   Benign essential HTN   Hypothyroidism   Fall   Elevated troponin   AKI (acute kidney injury) ([place])  Sepsis secondary to ESBL E. coli UTI: Sepsis resolved.  - Continue meropenem until mental status returns to baseline (improving) - Discussed with ID, Dr. Baxter Flattery who recommends fosfomycin the day after discharge and again 3 days thereafter.   Fall at ILF: No syncope. PD possibly contributing.  - PT/OT recommending SNF at discharge.  - Fall precautions  Parkinson's disease: Chronic, stable. - Resume carbidopa and levodopa.   AKI: Due to dehydration. Improved with IV fluids.   H/o recurrent PE's: INR sub therapeutic.  - Resume coumadin as per pharmacy.  Hypothyroidism: TSH 0.847 - Continue synthroid  Hypertension: Well controlled.  - Heart healthy diet  Hyperlipidemia: Chronic, stable - Continue statin  Hypokalemia: Poor  per oral intake.  - Replace and recheck in AM  DVT prophylaxis: Coumadin Code Status: DNR Family Communication: None at bedside Disposition Plan: DC to SNF once mental status is at baseline, hopefully 12/13.   Consultants:   ID, Dr. Baxter Flattery, by phone 12/12  Procedures:   None  Antimicrobials:  Meropenem  12/10 >>   Ceftriaxone 12/9  Subjective: Feels much more clear than when she came to the hospital. Still slow to respond. No fever, abd pain, N/V/D. Was unsteady with PT.   Objective: Vitals:   02/13/17 2150 02/14/17 0414 02/14/17 0513 02/14/17 1500  BP: (!) 148/66  (!) 145/72 140/70  Pulse: 79  77 72  Resp: (!) 22  20 20   Temp: 99.1 F (37.3 C)  98 F (36.7 C) 98.5 F (36.9 C)  TempSrc: Oral  Oral Oral  SpO2: 97%  98% 100%  Weight:  90.8 kg (200 lb 2.8 oz) 91.8 kg (202 lb 6.1 oz)   Height:        Intake/Output Summary (Last 24 hours) at 02/14/2017 1642 Last data filed at 02/14/2017 1242 Gross per 24 hour  Intake 2913.34 ml  Output 1750 ml  Net 1163.34 ml   Filed Weights   02/13/17 0455 02/14/17 0414 02/14/17 0513  Weight: 90.4 kg (199 lb 4.7 oz) 90.8 kg (200 lb 2.8 oz) 91.8 kg (202 lb 6.1 oz)    Gen: 71 y.o. female in no distress Pulm: Non-labored breathing room air. Clear to auscultation bilaterally.  CV: Regular rate and rhythm. No murmur, rub, or gallop. No JVD, no pedal edema. GI: Abdomen soft, non-tender, non-distended, with normoactive bowel sounds. No organomegaly  or masses felt. Ext: Warm, no deformities Skin: No rashes, lesions or ulcers Neuro: Alert and oriented. No focal neurological deficits. Psych: Judgement and insight appear normal. Affect flattened, slow to respond but responds appropriately.   Data Reviewed: I have personally reviewed following labs and imaging studies  CBC: Recent Labs  Lab 02/11/17 2035 02/12/17 0159 02/14/17 0447  WBC 21.2* 20.2* 7.4  NEUTROABS 18.2*  --   --   HGB 14.2 13.8 12.9  HCT 42.3 41.3 40.4  MCV  85.8 86.4 87.3  PLT 253 211 938   Basic Metabolic Panel: Recent Labs  Lab 02/11/17 2105 02/12/17 0159 02/14/17 0447  NA 135 135 141  K 3.8 3.5 3.2*  CL 101 100* 110  CO2 26 22 25   GLUCOSE 166* 143* 120*  BUN 26* 27* 16  CREATININE 1.02* 0.84 0.62  CALCIUM 8.4* 8.9 8.6*   GFR: Estimated Creatinine Clearance: 72.2 mL/min (by C-G formula based on SCr of 0.62 mg/dL). Liver Function Tests: Recent Labs  Lab 02/11/17 2105  AST 24  ALT 25  ALKPHOS 127*  BILITOT 1.3*  PROT 6.2*  ALBUMIN 2.9*   No results for input(s): LIPASE, AMYLASE in the last 168 hours. No results for input(s): AMMONIA in the last 168 hours. Coagulation Profile: Recent Labs  Lab 02/08/17 1037 02/11/17 2105 02/12/17 0724 02/13/17 0438 02/14/17 0447  INR 2.2 2.06 1.74 1.68 1.78   Cardiac Enzymes: Recent Labs  Lab 02/11/17 2035 02/11/17 2105 02/12/17 0159 02/12/17 0724 02/12/17 1307  CKTOTAL  --  33*  --   --   --   TROPONINI 0.05*  --  <0.03 <0.03 <0.03   BNP (last 3 results) No results for input(s): PROBNP in the last 8760 hours. HbA1C: No results for input(s): HGBA1C in the last 72 hours. CBG: No results for input(s): GLUCAP in the last 168 hours. Lipid Profile: No results for input(s): CHOL, HDL, LDLCALC, TRIG, CHOLHDL, LDLDIRECT in the last 72 hours. Thyroid Function Tests: Recent Labs    02/12/17 0159  TSH 0.847   Anemia Panel: No results for input(s): VITAMINB12, FOLATE, FERRITIN, TIBC, IRON, RETICCTPCT in the last 72 hours. Urine analysis:    Component Value Date/Time   COLORURINE AMBER (A) 02/11/2017 2003   APPEARANCEUR CLOUDY (A) 02/11/2017 2003   LABSPEC 1.025 02/11/2017 2003   PHURINE 6.0 02/11/2017 2003   GLUCOSEU NEGATIVE 02/11/2017 2003   HGBUR LARGE (A) 02/11/2017 2003   BILIRUBINUR SMALL (A) 02/11/2017 2003   BILIRUBINUR negative 06/27/2016 1222   KETONESUR 15 (A) 02/11/2017 2003   PROTEINUR 100 (A) 02/11/2017 2003   UROBILINOGEN negative (A) 06/27/2016 1222     UROBILINOGEN 0.2 09/07/2012 1049   NITRITE POSITIVE (A) 02/11/2017 2003   LEUKOCYTESUR MODERATE (A) 02/11/2017 2003   Recent Results (from the past 240 hour(s))  Urine culture     Status: Abnormal   Collection Time: 02/11/17  8:03 PM  Result Value Ref Range Status   Specimen Description URINE, RANDOM  Final   Special Requests NONE  Final   Culture (A)  Final    >=100,000 COLONIES/mL ESCHERICHIA COLI Confirmed Extended Spectrum Beta-Lactamase Producer (ESBL).  In bloodstream infections from ESBL organisms, carbapenems are preferred over piperacillin/tazobactam. They are shown to have a lower risk of mortality. Performed at [facility], [place] [address] [place], [place] [address]    Report Status 02/14/2017 FINAL  Final   Organism ID, Bacteria ESCHERICHIA COLI (A)  Final      Susceptibility  Escherichia coli - MIC*    AMPICILLIN >=32 RESISTANT Resistant     CEFAZOLIN >=64 RESISTANT Resistant     CEFTRIAXONE >=64 RESISTANT Resistant     CIPROFLOXACIN >=4 RESISTANT Resistant     GENTAMICIN <=1 SENSITIVE Sensitive     IMIPENEM <=0.25 SENSITIVE Sensitive     NITROFURANTOIN <=16 SENSITIVE Sensitive     TRIMETH/SULFA >=320 RESISTANT Resistant     AMPICILLIN/SULBACTAM >=32 RESISTANT Resistant     PIP/TAZO <=4 SENSITIVE Sensitive     Extended ESBL POSITIVE Resistant     * >=100,000 COLONIES/mL ESCHERICHIA COLI  Blood Culture (routine x 2)     Status: None (Preliminary result)   Collection Time: 02/11/17  8:19 PM  Result Value Ref Range Status   Specimen Description BLOOD RIGHT ANTECUBITAL  Final   Special Requests   Final    BOTTLES DRAWN AEROBIC AND ANAEROBIC Blood Culture adequate volume   Culture   Final    NO GROWTH 2 DAYS Performed at [facility], [address] [place], [place] [address]    Report Status PENDING  Incomplete  Blood Culture (routine x 2)     Status: None (Preliminary result)   Collection Time: 02/11/17  8:40 PM  Result Value Ref Range Status    Specimen Description BLOOD LEFT FOREARM UPPER  Final   Special Requests   Final    BOTTLES DRAWN AEROBIC AND ANAEROBIC Blood Culture adequate volume   Culture   Final    NO GROWTH 2 DAYS Performed at [facility], [place] [address] [place], [place] [address]    Report Status PENDING  Incomplete  MRSA PCR Screening     Status: None   Collection Time: 02/12/17 12:14 AM  Result Value Ref Range Status   MRSA by PCR NEGATIVE NEGATIVE Final    Comment:        The GeneXpert MRSA Assay (FDA approved for NASAL specimens only), is one component of a comprehensive MRSA colonization surveillance program. It is not intended to diagnose MRSA infection nor to guide or monitor treatment for MRSA infections.       Radiology Studies: No results found.  Scheduled Meds: . atorvastatin  10 mg Oral Daily  . Carbidopa-Levodopa ER  1 tablet Oral Q8H  . cholecalciferol  5,000 Units Oral Daily  . cycloSPORINE  1 drop Both Eyes BID  . fluticasone  1 spray Each Nare Daily  . levothyroxine  150 mcg Oral QAC breakfast  . loratadine  10 mg Oral Daily  . warfarin  6 mg Oral ONCE-1800  . Warfarin - Pharmacist Dosing Inpatient   Does not apply q1800   Continuous Infusions: . sodium chloride 100 mL/hr at 02/14/17 1529  . meropenem (MERREM) IV Stopped (02/14/17 1529)     LOS: 3 days   Time spent: 25 minutes.  Vance Gather, MD [facility] Pager [phone]  If 7PM-7AM, please contact night-coverage [email or web address] Password [facility] 02/14/2017, 4:42 PM

## 2017-02-14 NOTE — Progress Notes (Signed)
Occupational Therapy Evaluation Patient Details Name: [name] [name] MRN: [id number] DOB: 1945-12-13 Today's Date: 02/14/2017    History of Present Illness 71 yo female admitted with sepsis, fall at home. Hx of Parkinson's, fibromyalgia, PE, PVD, recurrent UTI.    Clinical Impression   Patient presents to OT with decreased ADL independence and safety due to the deficits listed below. She will benefit from skilled OT to maximize function and to facilitate a safe discharge. Patient is reportedly independent with ADLs at baseline. Currently she requires overall moderate assistance with ADLs. OT will follow.    Follow Up Recommendations  SNF    Equipment Recommendations  None recommended by OT    Recommendations for Other Services PT consult     Precautions / Restrictions Precautions Precautions: Fall Precaution Comments: incontinence Restrictions Weight Bearing Restrictions: No      Mobility Bed Mobility               General bed mobility comments: oob in recliner  Transfers Overall transfer level: Needs assistance Equipment used: Rolling walker (2 wheeled) Transfers: Sit to/from [facility] Sit to Stand: Min assist Stand pivot transfers: Min assist       General transfer comment: assist to rise, stabilize, control descent    Balance                                        ADL either performed or assessed with clinical judgement   ADL Overall ADL's : Needs assistance/impaired Eating/Feeding: Independent;Sitting   Grooming: Wash/dry hands;Set up;Sitting           Upper Body Dressing : Minimal assistance;Sitting   Lower Body Dressing: Minimal assistance;Moderate assistance;Sit to/from stand   Toilet Transfer: Minimal assistance;Cueing for safety;Stand-pivot;BSC;RW Toilet Transfer Details (indicate cue type and reason): urgent getting to toilet Toileting- Clothing Manipulation and Hygiene: Moderate assistance;Sit  to/from stand Toileting - Clothing Manipulation Details (indicate cue type and reason): needed assistance with hygiene after bowel movement     Functional mobility during ADLs: Minimal assistance;Rolling walker General ADL Comments: Patient reports independence with ADLs PTA but not independent during this evaluation. Urgent toileting resulting in unsafe transfer and patient unable to perform toilet hygiene after bowel movement.     Vision         Perception     Praxis      Pertinent Vitals/Pain Pain Assessment: No/denies pain     Hand Dominance Right   Extremity/Trunk Assessment Upper Extremity Assessment Upper Extremity Assessment: Overall WFL for tasks assessed   Lower Extremity Assessment Lower Extremity Assessment: Defer to PT evaluation   Cervical / Trunk Assessment Cervical / Trunk Assessment: Normal   Communication Communication Communication: No difficulties   Cognition Arousal/Alertness: Awake/alert Behavior During Therapy: WFL for tasks assessed/performed Overall Cognitive Status: Within Functional Limits for tasks assessed                                     General Comments       Exercises     Shoulder Instructions      Home Living Family/patient expects to be discharged to:: Private residence Living Arrangements: Alone   Type of Home: Independent living facility Home Access: Level entry     Home Layout: One level     Bathroom Shower/Tub: Occupational psychologist:  Standard Bathroom Accessibility: Yes How Accessible: Accessible via walker Home Equipment: Bedside commode;Walker - 4 wheels;Walker - 2 wheels;Shower seat - built in;Grab bars - toilet;Grab bars - tub/shower          Prior Functioning/Environment Level of Independence: Independent with assistive device(s)        Comments: Reports she walks to dining hall with RW and manages ADLs independently        OT Problem List: Decreased  strength;Decreased activity tolerance;Impaired balance (sitting and/or standing);Decreased safety awareness;Decreased knowledge of use of DME or AE      OT Treatment/Interventions: Self-care/ADL training;DME and/or AE instruction;Therapeutic activities;Patient/family education    OT Goals(Current goals can be found in the care plan section) Acute Rehab OT Goals Patient Stated Goal: return to IND living apt OT Goal Formulation: With patient Time For Goal Achievement: 02/28/17 Potential to Achieve Goals: Good ADL Goals Pt Will Perform Upper Body Bathing: with set-up;sitting Pt Will Perform Lower Body Bathing: with supervision;sit to/from stand Pt Will Perform Upper Body Dressing: with set-up;sitting Pt Will Perform Lower Body Dressing: with supervision;sit to/from stand Pt Will Transfer to Toilet: with supervision;stand pivot transfer;bedside commode Pt Will Perform Toileting - Clothing Manipulation and hygiene: with supervision;sit to/from stand  OT Frequency: Min 2X/week   Barriers to D/C: Decreased caregiver support  lives alone       Co-evaluation              AM-PAC PT "6 Clicks" Daily Activity     Outcome Measure Help from another person eating meals?: None Help from another person taking care of personal grooming?: A Little Help from another person toileting, which includes using toliet, bedpan, or urinal?: A Lot Help from another person bathing (including washing, rinsing, drying)?: A Lot Help from another person to put on and taking off regular upper body clothing?: A Little Help from another person to put on and taking off regular lower body clothing?: A Lot 6 Click Score: 16   End of Session Equipment Utilized During Treatment: Rolling walker Nurse Communication: Mobility status  Activity Tolerance: Patient tolerated treatment well Patient left: in chair;with call bell/phone within reach;with chair alarm set  OT Visit Diagnosis: Unsteadiness on feet  (R26.81);Muscle weakness (generalized) (M62.81);Repeated falls (R29.6)                Time: 3151-7616 OT Time Calculation (min): 14 min Charges:  OT General Charges $OT Visit: 1 Visit OT Evaluation $OT Eval Moderate Complexity: 1 Mod G-Codes:      Levan Aloia A Bernell Sigal Feb 25, 2017, 2:26 PM

## 2017-02-14 NOTE — Telephone Encounter (Signed)
done

## 2017-02-14 NOTE — Telephone Encounter (Signed)
FYI. Please advise.

## 2017-02-14 NOTE — Plan of Care (Signed)
Plan of care reviewed. No new updates at this time.

## 2017-02-15 DIAGNOSIS — R748 Abnormal levels of other serum enzymes: Secondary | ICD-10-CM

## 2017-02-15 DIAGNOSIS — R41841 Cognitive communication deficit: Secondary | ICD-10-CM | POA: Diagnosis not present

## 2017-02-15 DIAGNOSIS — R4182 Altered mental status, unspecified: Secondary | ICD-10-CM | POA: Diagnosis not present

## 2017-02-15 DIAGNOSIS — R2689 Other abnormalities of gait and mobility: Secondary | ICD-10-CM | POA: Diagnosis not present

## 2017-02-15 DIAGNOSIS — G20 Parkinson's disease: Secondary | ICD-10-CM | POA: Diagnosis not present

## 2017-02-15 DIAGNOSIS — G8929 Other chronic pain: Secondary | ICD-10-CM | POA: Diagnosis not present

## 2017-02-15 DIAGNOSIS — A4151 Sepsis due to Escherichia coli [E. coli]: Secondary | ICD-10-CM | POA: Diagnosis not present

## 2017-02-15 DIAGNOSIS — E785 Hyperlipidemia, unspecified: Secondary | ICD-10-CM | POA: Diagnosis not present

## 2017-02-15 DIAGNOSIS — E039 Hypothyroidism, unspecified: Secondary | ICD-10-CM | POA: Diagnosis not present

## 2017-02-15 DIAGNOSIS — M549 Dorsalgia, unspecified: Secondary | ICD-10-CM | POA: Diagnosis not present

## 2017-02-15 DIAGNOSIS — N179 Acute kidney failure, unspecified: Secondary | ICD-10-CM | POA: Diagnosis not present

## 2017-02-15 DIAGNOSIS — A419 Sepsis, unspecified organism: Secondary | ICD-10-CM | POA: Diagnosis not present

## 2017-02-15 DIAGNOSIS — M6281 Muscle weakness (generalized): Secondary | ICD-10-CM | POA: Diagnosis not present

## 2017-02-15 DIAGNOSIS — R278 Other lack of coordination: Secondary | ICD-10-CM | POA: Diagnosis not present

## 2017-02-15 DIAGNOSIS — M25511 Pain in right shoulder: Secondary | ICD-10-CM | POA: Diagnosis not present

## 2017-02-15 DIAGNOSIS — I10 Essential (primary) hypertension: Secondary | ICD-10-CM | POA: Diagnosis not present

## 2017-02-15 DIAGNOSIS — R6520 Severe sepsis without septic shock: Secondary | ICD-10-CM | POA: Diagnosis not present

## 2017-02-15 DIAGNOSIS — Z9181 History of falling: Secondary | ICD-10-CM | POA: Diagnosis not present

## 2017-02-15 DIAGNOSIS — Z79899 Other long term (current) drug therapy: Secondary | ICD-10-CM | POA: Diagnosis not present

## 2017-02-15 DIAGNOSIS — R488 Other symbolic dysfunctions: Secondary | ICD-10-CM | POA: Diagnosis not present

## 2017-02-15 DIAGNOSIS — W19XXXA Unspecified fall, initial encounter: Secondary | ICD-10-CM | POA: Diagnosis not present

## 2017-02-15 DIAGNOSIS — M542 Cervicalgia: Secondary | ICD-10-CM | POA: Diagnosis not present

## 2017-02-15 DIAGNOSIS — N390 Urinary tract infection, site not specified: Secondary | ICD-10-CM | POA: Diagnosis not present

## 2017-02-15 DIAGNOSIS — R2681 Unsteadiness on feet: Secondary | ICD-10-CM | POA: Diagnosis not present

## 2017-02-15 DIAGNOSIS — G8911 Acute pain due to trauma: Secondary | ICD-10-CM | POA: Diagnosis not present

## 2017-02-15 LAB — PROTIME-INR
INR: 2.31
Prothrombin Time: 25.2 seconds — ABNORMAL HIGH (ref 11.4–15.2)

## 2017-02-15 MED ORDER — OXYCODONE HCL 10 MG PO TABS: ORAL_TABLET | ORAL | 0 refills | Status: DC

## 2017-02-15 MED ORDER — WARFARIN SODIUM 2 MG PO TABS
2.0000 mg | ORAL_TABLET | Freq: Once | ORAL | Status: AC
  Administered 2017-02-15: 2 mg via ORAL
  Filled 2017-02-15: qty 1

## 2017-02-15 MED ORDER — FOSFOMYCIN TROMETHAMINE 3 G PO PACK: 3.0000 g | PACK | ORAL | 0 refills | Status: AC

## 2017-02-15 MED ORDER — METHOCARBAMOL 500 MG PO TABS: 500.0000 mg | ORAL_TABLET | Freq: Four times a day (QID) | ORAL | 0 refills | Status: DC | PRN

## 2017-02-15 NOTE — Progress Notes (Signed)
Physical Therapy Treatment Patient Details Name: [name] [name] MRN: [id number] DOB: [birth year] Today's Date: 02/15/2017    History of Present Illness 71 yo female admitted with sepsis, fall at home. Hx of Parkinson's, fibromyalgia, PE, PVD, recurrent UTI.     PT Comments    Progressing with mobility. Pt continues to require Min assist for mobility. Continue to recommend ST rehab at SNF to regain PLOF/independence.    Follow Up Recommendations  SNF     Equipment Recommendations  None recommended by PT    Recommendations for Other Services       Precautions / Restrictions Precautions Precautions: Fall Precaution Comments: incontinence Restrictions Weight Bearing Restrictions: No    Mobility  Bed Mobility Overal bed mobility: Needs Assistance Bed Mobility: Supine to Sit;Sit to Supine     Supine to sit: Min assist;HOB elevated Sit to supine: Min assist;HOB elevated   General bed mobility comments: Assist for trunk and LEs. Increased time. Reliance on bedrail was noted.   Transfers Overall transfer level: Needs assistance Equipment used: Rolling walker (2 wheeled) Transfers: Sit to/from Stand Sit to Stand: Min assist;From elevated surface         General transfer comment: Assist to rise, stabilize, control descent. Pt tried multiple times to rise unassisted but was unable.   Ambulation/Gait Ambulation/Gait assistance: Min assist Ambulation Distance (Feet): 115 Feet Assistive device: Rolling walker (2 wheeled) Gait Pattern/deviations: Decreased step length - right;Decreased step length - left;Shuffle;Decreased stride length     General Gait Details: Less cueing required on today. Intermittent shuffle noted. Assist to stabilize throughout distance. Pt tolerated distance well.    Stairs            Wheelchair Mobility    Modified Rankin (Stroke Patients Only)       Balance Overall balance assessment: Needs assistance;History of Falls          Standing balance support: Bilateral upper extremity supported Standing balance-Leahy Scale: Poor                              Cognition Arousal/Alertness: Awake/alert Behavior During Therapy: WFL for tasks assessed/performed Overall Cognitive Status: Impaired/Different from baseline Area of Impairment: Orientation;Problem solving                 Orientation Level: Disoriented to;Time           Problem Solving: Requires verbal cues;Requires tactile cues        Exercises      General Comments        Pertinent Vitals/Pain Pain Assessment: No/denies pain    Home Living                      Prior Function            PT Goals (current goals can now be found in the care plan section) Progress towards PT goals: Progressing toward goals    Frequency    Min 3X/week      PT Plan      Co-evaluation              AM-PAC PT "6 Clicks" Daily Activity  Outcome Measure  Difficulty turning over in bed (including adjusting bedclothes, sheets and blankets)?: Unable Difficulty moving from lying on back to sitting on the side of the bed? : Unable Difficulty sitting down on and standing up from a chair with arms (e.g., wheelchair, bedside commode, etc,.)?: Unable Help  needed moving to and from a bed to chair (including a wheelchair)?: A Little Help needed walking in hospital room?: A Little Help needed climbing 3-5 steps with a railing? : A Lot 6 Click Score: 11    End of Session Equipment Utilized During Treatment: Gait belt Activity Tolerance: Patient tolerated treatment well Patient left: in bed;with bed alarm set;with call bell/phone within reach   PT Visit Diagnosis: Muscle weakness (generalized) (M62.81);Difficulty in walking, not elsewhere classified (R26.2)     Time: 4356-8616 PT Time Calculation (min) (ACUTE ONLY): 21 min  Charges:  $Gait Training: 8-22 mins                    G Codes:          [name] [name],  MPT Pager: [phone]

## 2017-02-15 NOTE — Discharge Summary (Addendum)
Physician Discharge Summary  [name] [name] IPJ:[id number] DOB: Jun 20, 1945 DOA: 02/11/2017  PCP: Darreld Mclean, MD  Admit date: 02/11/2017 Discharge date: 02/15/2017  Admitted From: ILF Disposition: SNF   Recommendations for Outpatient Follow-up:  1. Follow up with PCP in 1-2 weeks 2. Please obtain BMP, CBC, PT/INR in the next week 3. Complete antibiotics course for UTI with fosfomycin on 12/14, and again on 12/17. 4. Resume coumadin, per pharmacy recommendations, will need coumadin 2mg  po around 6pm 12/13, then 4mg  po daily thereafter.   Home Health: N/A Equipment/Devices: Per SNF Discharge Condition: Stable CODE STATUS: DNR Diet recommendation: Heart healthy  Brief/Interim Summary: [name] [name] is a 71 y.o. female with a history of PD, recurrent ESBL UTI's, HTN, HLD, fibromyalgia, and hypothyroidism who presented to the ED after a slip and fall while exiting bed at her independent living facility. She usually gets around with a walker and was unable to get up independently. On arrival she appeared confused, febrile to 101.89F with WBC 21.2k and nitrite-positive pyuria on UA. CT head and CXR showed no acute abnormalities. Meropenem was given for UTI and she was admitted. Leukocytosis resolved and fever subsided. Mental status is clearing. Blood cultures have remained negative, though urine culture has confirmed ESBL E. coli. Meropenem is continued per ID recommendations with plans to transition to fosfomycin at discharge. PT has recommended short term rehabilitation prior to return to previous venue.   Discharge Diagnoses:  Principal Problem:   Sepsis secondary to UTI [facility]) Active Problems:   Parkinson's disease ([place])   Benign essential HTN   Hypothyroidism   Fall   Elevated troponin   AKI (acute kidney injury) ([place])  Sepsis secondary to ESBL E. coli UTI: Sepsis resolved.  - Given meropenem with resolution of acute metabolic encephalopathy. - Discussed with ID, Dr. Baxter Flattery who  recommends fosfomycin the day after discharge and again 3 days thereafter.   Fall at ILF: No syncope. PD possibly contributing.  - PT/OT recommending SNF at discharge.  - Fall precautions  Parkinson's disease: Chronic, stable. - Resume carbidopa and levodopa.   AKI: Due to dehydration. Improved with IV fluids. Cr 0.62 at discharge.  H/o recurrent PE's: INR subtherapeutic.  - Resume coumadin, per pharmacy recommendations, will need coumadin 2mg  po around 6pm 12/13, then 4mg  po daily thereafter.  - Monitor INR  Hypothyroidism: TSH 0.847 - Continue synthroid  Hypertension: Well controlled. - Heart healthy diet  Hyperlipidemia: Chronic, stable - Continue statin  Hypokalemia: Poor per oral intake, improved with replacement.  - Recommend recheck in the next week.  Discharge Instructions Discharge Instructions    Diet - low sodium heart healthy   Complete by:  As directed    Increase activity slowly   Complete by:  As directed      Allergies as of 02/15/2017      Reactions   Crestor [rosuvastatin Calcium] Other (See Comments)   Feeling very bad, aching all over.   Erythromycin Hives   Gabapentin Other (See Comments)   Blurred vision   Pregabalin Other (See Comments)   Crossed vision.   Carbidopa Other (See Comments)   Headaches, nausea, dizziness   Macrobid [nitrofurantoin] Hives   Losartan Itching      Medication List    STOP taking these medications   HYDROcodone-acetaminophen 5-325 MG tablet Commonly known as:  NORCO/VICODIN   zolpidem 5 MG tablet Commonly known as:  AMBIEN     TAKE these medications   acetaminophen 500 MG tablet Commonly known as:  TYLENOL Take 500 mg by mouth every 6 (six) hours as needed for mild pain.   albuterol 108 (90 Base) MCG/ACT inhaler Commonly known as:  PROVENTIL HFA;VENTOLIN HFA Inhale 2 puffs into the lungs every 6 (six) hours as needed for wheezing or shortness of breath.   atorvastatin 10 MG tablet Commonly  known as:  LIPITOR Take 1 tablet (10 mg total) by mouth daily.   BIOFREEZE EX Apply 1 application topically daily as needed (for pain).   Carbidopa-Levodopa ER 23.75-95 MG Cpcr Commonly known as:  RYTARY Take 1 tablet 3 (three) times daily by mouth.   celecoxib 100 MG capsule Commonly known as:  CELEBREX Take 1 capsule (100 mg total) by mouth 2 (two) times daily.   cetirizine 10 MG tablet Commonly known as:  ZYRTEC Take 1 tablet (10 mg total) by mouth daily.   cyanocobalamin 500 MCG tablet Take 500 mcg by mouth daily.   cycloSPORINE 0.05 % ophthalmic emulsion Commonly known as:  RESTASIS Place 1 drop into both eyes 2 (two) times daily.   DULoxetine 60 MG capsule Commonly known as:  CYMBALTA Take 1 capsule (60 mg total) by mouth daily.   FISH OIL PO Take 1 capsule by mouth daily.   fluticasone 50 MCG/ACT nasal spray Commonly known as:  FLONASE Place 1 spray into both nostrils daily.   fosfomycin 3 g Pack Commonly known as:  MONUROL Take 3 g by mouth every 3 (three) days for 2 doses. Start taking on:  02/16/2017   magnesium oxide 400 (241.3 Mg) MG tablet Commonly known as:  MAG-OX Take 0.5 tablets (200 mg total) by mouth daily.   Melatonin 5 MG Tabs Take 1 tablet by mouth at bedtime as needed (for sleep).   methocarbamol 500 MG tablet Commonly known as:  ROBAXIN Take 1 tablet (500 mg total) by mouth every 6 (six) hours as needed for muscle spasms.   nicotine polacrilex 2 MG gum Commonly known as:  NICORETTE Take 2 mg by mouth as needed for smoking cessation.   ondansetron 4 MG disintegrating tablet Commonly known as:  ZOFRAN ODT Take 1 tablet (4 mg total) by mouth every 8 (eight) hours as needed for nausea or vomiting.   Oxycodone HCl 10 MG Tabs Take 1/2 tablet daily as needed for neck pain. If pain persists may take another 1/2 tablet per day What changed:    how much to take  how to take this  when to take this  additional instructions    SYNTHROID 150 MCG tablet Generic drug:  levothyroxine Take 1 tablet (150 mcg total) by mouth daily before breakfast.   telmisartan-hydrochlorothiazide 40-12.5 MG tablet Commonly known as:  MICARDIS HCT Take 1 tablet by mouth daily.   Vitamin D3 5000 units Caps Take 5,000 Units by mouth daily.   warfarin 4 MG tablet Commonly known as:  COUMADIN Take as directed. If you are unsure how to take this medication, talk to your nurse or doctor. Original instructions:  Take 1 tablet (4 mg total) by mouth See admin instructions. Adjust as directed by MD What changed:    when to take this  additional instructions       Contact information for follow-up providers    [name], [name], MD. Schedule an appointment as soon as possible for a visit in 2 week(s).   Specialty:  Family Medicine Contact information: [place] [address] [phone]  Contact information for after-discharge care    Destination    [facility] Follow up.   Service:  Skilled Nursing Contact information: [place] [address] [phone]                 Allergies  Allergen Reactions  . Crestor [Rosuvastatin Calcium] Other (See Comments)    Feeling very bad, aching all over.  . Erythromycin Hives  . Gabapentin Other (See Comments)    Blurred vision  . Pregabalin Other (See Comments)    Crossed vision.  . Carbidopa Other (See Comments)    Headaches, nausea, dizziness  . Macrobid [Nitrofurantoin] Hives  . Losartan Itching    Consultations:  ID, Dr. Baxter Flattery, by phone 12/12  Procedures/Studies: Dg Chest 1 View  Result Date: 02/11/2017 CLINICAL DATA:  Fall earlier today.  Fever. EXAM: CHEST 1 VIEW COMPARISON:  None. FINDINGS: There is mild scarring in the medial left base. There is no edema or consolidation. Heart size and pulmonary vascularity are normal. No adenopathy. There is aortic atherosclerosis. No  pneumothorax. No bone lesions are evident. IMPRESSION: Mild scarring medial left base. No edema or consolidation. Stable cardiomegaly. There is aortic atherosclerosis. Aortic Atherosclerosis (ICD10-I70.0). Electronically Signed   By: Lowella Grip III M.D.   On: 02/11/2017 20:58   Ct Head Wo Contrast  Result Date: 02/11/2017 CLINICAL DATA:  Fall, and neck pain. History of Parkinson's disease, cervical spine fracture. EXAM: CT HEAD WITHOUT CONTRAST CT CERVICAL SPINE WITHOUT CONTRAST TECHNIQUE: Multidetector CT imaging of the head and cervical spine was performed following the standard protocol without intravenous contrast. Multiplanar CT image reconstructions of the cervical spine were also generated. COMPARISON:  CT HEAD and cervical spine December 07, 2016 FINDINGS: CT HEAD FINDINGS BRAIN: No intraparenchymal hemorrhage, mass effect nor midline shift. Moderate to severe ventriculomegaly, cavum septum pellucidum. Patchy to confluent supratentorial white matter hypodensities. No acute large vascular territory infarcts. No abnormal extra-axial fluid collections. Basal cisterns are patent. VASCULAR: Mild calcific atherosclerosis of the carotid siphons. SKULL: No skull fracture. Severe LEFT temporomandibular osteoarthrosis. No significant scalp soft tissue swelling. SINUSES/ORBITS: The mastoid air-cells and included paranasal sinuses are well-aerated.The included ocular globes and orbital contents are non-suspicious. Status post bilateral ocular lens implants. OTHER: None. CT CERVICAL SPINE FINDINGS ALIGNMENT: Straightened lordosis. Vertebral bodies in alignment. SKULL BASE AND VERTEBRAE: Old mild C5 compression fracture, as similar to prior MRI multilevel mild cervical spondylosis. Facet fusion is C2 through C6 with accessory interspinous bone C5-6. C1-2 articulation maintained with mild arthropathy. No destructive bony lesions. SOFT TISSUES AND SPINAL CANAL: Nonacute. Mild calcific atherosclerosis. Status post  thyroidectomy. Calcific atherosclerosis included aortic arch vessels. DISC LEVELS: No significant osseous canal stenosis. Severe LEFT C3-4 neural foraminal narrowing. UPPER CHEST: Lung apices are clear. OTHER: None. IMPRESSION: CT HEAD: 1. No acute intracranial process. 2. Moderate to severe parenchymal brain volume loss and moderate chronic small vessel ischemic disease. CT CERVICAL SPINE: 1. No acute fracture or malalignment. 2. Severe LEFT C3-4 neural foraminal narrowing. Electronically Signed   By: Elon Alas M.D.   On: 02/11/2017 21:09   Ct Cervical Spine Wo Contrast  Result Date: 02/11/2017 CLINICAL DATA:  Fall, and neck pain. History of Parkinson's disease, cervical spine fracture. EXAM: CT HEAD WITHOUT CONTRAST CT CERVICAL SPINE WITHOUT CONTRAST TECHNIQUE: Multidetector CT imaging of the head and cervical spine was performed following the standard protocol without intravenous contrast. Multiplanar CT image reconstructions of the cervical spine were also generated. COMPARISON:  CT HEAD  and cervical spine December 07, 2016 FINDINGS: CT HEAD FINDINGS BRAIN: No intraparenchymal hemorrhage, mass effect nor midline shift. Moderate to severe ventriculomegaly, cavum septum pellucidum. Patchy to confluent supratentorial white matter hypodensities. No acute large vascular territory infarcts. No abnormal extra-axial fluid collections. Basal cisterns are patent. VASCULAR: Mild calcific atherosclerosis of the carotid siphons. SKULL: No skull fracture. Severe LEFT temporomandibular osteoarthrosis. No significant scalp soft tissue swelling. SINUSES/ORBITS: The mastoid air-cells and included paranasal sinuses are well-aerated.The included ocular globes and orbital contents are non-suspicious. Status post bilateral ocular lens implants. OTHER: None. CT CERVICAL SPINE FINDINGS ALIGNMENT: Straightened lordosis. Vertebral bodies in alignment. SKULL BASE AND VERTEBRAE: Old mild C5 compression fracture, as similar to  prior MRI multilevel mild cervical spondylosis. Facet fusion is C2 through C6 with accessory interspinous bone C5-6. C1-2 articulation maintained with mild arthropathy. No destructive bony lesions. SOFT TISSUES AND SPINAL CANAL: Nonacute. Mild calcific atherosclerosis. Status post thyroidectomy. Calcific atherosclerosis included aortic arch vessels. DISC LEVELS: No significant osseous canal stenosis. Severe LEFT C3-4 neural foraminal narrowing. UPPER CHEST: Lung apices are clear. OTHER: None. IMPRESSION: CT HEAD: 1. No acute intracranial process. 2. Moderate to severe parenchymal brain volume loss and moderate chronic small vessel ischemic disease. CT CERVICAL SPINE: 1. No acute fracture or malalignment. 2. Severe LEFT C3-4 neural foraminal narrowing. Electronically Signed   By: Elon Alas M.D.   On: 02/11/2017 21:09   Subjective: No complaints, eating better than before. Mental status is back to baseline. No fevers or diarrhea.   Discharge Exam: Vitals:   02/14/17 2007 02/15/17 0508  BP: (!) 150/70 132/69  Pulse: 73 66  Resp: 19 18  Temp: 98.4 F (36.9 C) 97.7 F (36.5 C)  SpO2: 100% 95%   General: Pt is alert, awake, not in acute distress Cardiovascular: RRR, S1/S2 +, no rubs, no gallops Respiratory: CTA bilaterally, no wheezing, no rhonchi Abdominal: Soft, NT, ND, bowel sounds + Extremities: No edema, no cyanosis Neuro: Alert, oriented, no focal deficits or tremor noted.   Labs: BNP (last 3 results) Recent Labs    09/17/16 2342  BNP 95.6   Basic Metabolic Panel: Recent Labs  Lab 02/11/17 2105 02/12/17 0159 02/14/17 0447  NA 135 135 141  K 3.8 3.5 3.2*  CL 101 100* 110  CO2 26 22 25   GLUCOSE 166* 143* 120*  BUN 26* 27* 16  CREATININE 1.02* 0.84 0.62  CALCIUM 8.4* 8.9 8.6*   Liver Function Tests: Recent Labs  Lab 02/11/17 2105  AST 24  ALT 25  ALKPHOS 127*  BILITOT 1.3*  PROT 6.2*  ALBUMIN 2.9*   No results for input(s): LIPASE, AMYLASE in the last 168  hours. No results for input(s): AMMONIA in the last 168 hours. CBC: Recent Labs  Lab 02/11/17 2035 02/12/17 0159 02/14/17 0447  WBC 21.2* 20.2* 7.4  NEUTROABS 18.2*  --   --   HGB 14.2 13.8 12.9  HCT 42.3 41.3 40.4  MCV 85.8 86.4 87.3  PLT 253 211 241   Cardiac Enzymes: Recent Labs  Lab 02/11/17 2035 02/11/17 2105 02/12/17 0159 02/12/17 0724 02/12/17 1307  CKTOTAL  --  33*  --   --   --   TROPONINI 0.05*  --  <0.03 <0.03 <0.03   BNP: Invalid input(s): POCBNP CBG: No results for input(s): GLUCAP in the last 168 hours. D-Dimer No results for input(s): DDIMER in the last 72 hours. Hgb A1c No results for input(s): HGBA1C in the last 72 hours. Lipid Profile No results for  input(s): CHOL, HDL, LDLCALC, TRIG, CHOLHDL, LDLDIRECT in the last 72 hours. Thyroid function studies No results for input(s): TSH, T4TOTAL, T3FREE, THYROIDAB in the last 72 hours.  Invalid input(s): FREET3 Anemia work up No results for input(s): VITAMINB12, FOLATE, FERRITIN, TIBC, IRON, RETICCTPCT in the last 72 hours. Urinalysis    Component Value Date/Time   COLORURINE AMBER (A) 02/11/2017 2003   APPEARANCEUR CLOUDY (A) 02/11/2017 2003   LABSPEC 1.025 02/11/2017 2003   PHURINE 6.0 02/11/2017 2003   GLUCOSEU NEGATIVE 02/11/2017 2003   HGBUR LARGE (A) 02/11/2017 2003   BILIRUBINUR SMALL (A) 02/11/2017 2003   BILIRUBINUR negative 06/27/2016 1222   KETONESUR 15 (A) 02/11/2017 2003   PROTEINUR 100 (A) 02/11/2017 2003   UROBILINOGEN negative (A) 06/27/2016 1222   UROBILINOGEN 0.2 09/07/2012 1049   NITRITE POSITIVE (A) 02/11/2017 2003   LEUKOCYTESUR MODERATE (A) 02/11/2017 2003    Microbiology Recent Results (from the past 240 hour(s))  Urine culture     Status: Abnormal   Collection Time: 02/11/17  8:03 PM  Result Value Ref Range Status   Specimen Description URINE, RANDOM  Final   Special Requests NONE  Final   Culture (A)  Final    >=100,000 COLONIES/mL ESCHERICHIA COLI Confirmed  Extended Spectrum Beta-Lactamase Producer (ESBL).  In bloodstream infections from ESBL organisms, carbapenems are preferred over piperacillin/tazobactam. They are shown to have a lower risk of mortality. Performed at [facility], [place] [address], [place], [place] [address]    Report Status 02/14/2017 FINAL  Final   Organism ID, Bacteria ESCHERICHIA COLI (A)  Final      Susceptibility   Escherichia coli - MIC*    AMPICILLIN >=32 RESISTANT Resistant     CEFAZOLIN >=64 RESISTANT Resistant     CEFTRIAXONE >=64 RESISTANT Resistant     CIPROFLOXACIN >=4 RESISTANT Resistant     GENTAMICIN <=1 SENSITIVE Sensitive     IMIPENEM <=0.25 SENSITIVE Sensitive     NITROFURANTOIN <=16 SENSITIVE Sensitive     TRIMETH/SULFA >=320 RESISTANT Resistant     AMPICILLIN/SULBACTAM >=32 RESISTANT Resistant     PIP/TAZO <=4 SENSITIVE Sensitive     Extended ESBL POSITIVE Resistant     * >=100,000 COLONIES/mL ESCHERICHIA COLI  Blood Culture (routine x 2)     Status: None (Preliminary result)   Collection Time: 02/11/17  8:19 PM  Result Value Ref Range Status   Specimen Description BLOOD RIGHT ANTECUBITAL  Final   Special Requests   Final    BOTTLES DRAWN AEROBIC AND ANAEROBIC Blood Culture adequate volume   Culture   Final    NO GROWTH 3 DAYS Performed at [facility], [address], [place], [place] [address]    Report Status PENDING  Incomplete  Blood Culture (routine x 2)     Status: None (Preliminary result)   Collection Time: 02/11/17  8:40 PM  Result Value Ref Range Status   Specimen Description BLOOD LEFT FOREARM UPPER  Final   Special Requests   Final    BOTTLES DRAWN AEROBIC AND ANAEROBIC Blood Culture adequate volume   Culture   Final    NO GROWTH 3 DAYS Performed at [facility], [place] [address], [place], [place] [address]    Report Status PENDING  Incomplete  MRSA PCR Screening     Status: None   Collection Time: 02/12/17 12:14 AM  Result Value Ref Range Status   MRSA by  PCR NEGATIVE NEGATIVE Final    Comment:  The GeneXpert MRSA Assay (FDA approved for NASAL specimens only), is one component of a comprehensive MRSA colonization surveillance program. It is not intended to diagnose MRSA infection nor to guide or monitor treatment for MRSA infections.     Time coordinating discharge: Approximately 40 minutes  Vance Gather, MD  [facility] 02/15/2017, 2:13 PM Pager [phone]

## 2017-02-15 NOTE — Progress Notes (Signed)
Provided SNF bed offers to pt and daughter- [facility] and CSW spoke with admissions to confirm. Will facilitate transfer there at DC.  Sharren Bridge, MSW, LCSW Clinical Social Work 02/15/2017 [phone]

## 2017-02-15 NOTE — Clinical Social Work Placement (Addendum)
  Pt discharged and will admit to [facility] [id number] report# [phone]  [facility] transport arranged  All information provided to facility via the [facility]  Pt's daughter aware and agreeable to plan See below for placement details  CLINICAL SOCIAL WORK PLACEMENT  NOTE  Date:  02/15/2017  Patient Details  Name: [name] [name] MRN: [id number] Date of Birth: 1945/03/10  Clinical Social Work is seeking post-discharge placement for this patient at the [place] level of care (*CSW will initial, date and re-position this form in  chart as items are completed):  Yes   Patient/family provided with [facility] Work Department's list of facilities offering this level of care within the geographic area requested by the patient (or if unable, by the patient's family).  Yes   Patient/family informed of their freedom to choose among providers that offer the needed level of care, that participate in Medicare, Medicaid or managed care program needed by the patient, have an available bed and are willing to accept the patient.  Yes   Patient/family informed of [facility]'s ownership interest in [facility] and [facility], as well as of the fact that they are under no obligation to receive care at these facilities.  PASRR submitted to EDS on       PASRR number received on       Existing PASRR number confirmed on 02/14/17     FL2 transmitted to all facilities in geographic area requested by pt/family on 02/14/17     FL2 transmitted to all facilities within larger geographic area on       Patient informed that his/her managed care company has contracts with or will negotiate with certain facilities, including the following:        Yes   Patient/family informed of bed offers received.  Patient chooses bed at [facility]     Physician recommends and patient chooses bed at [facility]    Patient to be transferred to [facility] on 02/15/17.  Patient to be  transferred to facility by [facility]     Patient family notified on 02/15/17 of transfer.  Name of family member notified:  daughter [facility]   PHYSICIAN       Additional Comment:    _______________________________________________ Nila Nephew, LCSW 02/15/2017, 3:25 PM

## 2017-02-15 NOTE — Progress Notes (Addendum)
ANTICOAGULATION CONSULT NOTE - Follow Up Consult  Pharmacy Consult for warfarin Indication: hx pulmonary embolus  Allergies  Allergen Reactions  . Crestor [Rosuvastatin Calcium] Other (See Comments)    Feeling very bad, aching all over.  . Erythromycin Hives  . Gabapentin Other (See Comments)    Blurred vision  . Pregabalin Other (See Comments)    Crossed vision.  . Carbidopa Other (See Comments)    Headaches, nausea, dizziness  . Macrobid [Nitrofurantoin] Hives  . Losartan Itching    Patient Measurements: Height: 5' 6.5" (168.9 cm) Weight: 200 lb 6.4 oz (90.9 kg) IBW/kg (Calculated) : 60.45 Heparin Dosing Weight:   Vital Signs: Temp: 97.7 F (36.5 C) (12/13 0508) Temp Source: Oral (12/13 0508) BP: 132/69 (12/13 0508) Pulse Rate: 66 (12/13 0508)  Labs: Recent Labs    02/12/17 1307 02/13/17 0438 02/14/17 0447 02/15/17 0444  HGB  --   --  12.9  --   HCT  --   --  40.4  --   PLT  --   --  241  --   LABPROT  --  19.6* 20.6* 25.2*  INR  --  1.68 1.78 2.31  CREATININE  --   --  0.62  --   TROPONINI <0.03  --   --   --     Estimated Creatinine Clearance: 71.9 mL/min (by C-G formula based on SCr of 0.62 mg/dL).   Medications:  PTA warfarin regimen: 4mg  daily  Assessment: Patient is a 71 y.o F with hx recurrent PE on warfarin PTA, presented to the ED on 02/11/17 for AMS and s/p fall at nursing facility. Head CT with no acute findings. Warfarin resumed on admission.  Today, 02/15/2017: - INR is therapeutic but increased noticeably from 1.78 to 2.31 today (after dose increased to 6 mg x2 days) - cbc relatively stable - no bleeding documented - drug-drug intxns: being on abx can make pt more sensitive to warfarin (on merrem)  Goal of Therapy:  INR 2-3 Monitor platelets by anticoagulation protocol: Yes   Plan:  -  warfarin dose to 2 mg PO x1 - daily INR - monitor for s/s bleeding ______________________________  Patient's currently on meropenem day #4 for  ESBL Ecoli.  ID recom to transition to fosfomycin x2 doses at discharge.    Antimicrobials this admission:  12/9 Ceftriaxone>>12/10 12/9 Meropenem>>  Microbiology results:  11/23/16 bcx: ESBL ecoli (S= gent, imi, zosyn) 11/23/16 ucx: ESBL ecoli (S= gent, imi, nitro, zosyn)  12/10 BCx x2:  12/10 UCx:  >100K ESBL ecoli (S= gent, imi, nitro, zosyn) FINAL 12/10 MRSA PCR: neg  - scr stable (crcl~72)  Plan: continue with meropenem 1gm IV q8h for now   Lynelle Doctor 02/15/2017,9:02 AM

## 2017-02-16 ENCOUNTER — Telehealth: Payer: Self-pay

## 2017-02-16 DIAGNOSIS — N390 Urinary tract infection, site not specified: Secondary | ICD-10-CM | POA: Diagnosis not present

## 2017-02-16 DIAGNOSIS — I10 Essential (primary) hypertension: Secondary | ICD-10-CM | POA: Diagnosis not present

## 2017-02-16 DIAGNOSIS — G20 Parkinson's disease: Secondary | ICD-10-CM | POA: Diagnosis not present

## 2017-02-16 DIAGNOSIS — Z9181 History of falling: Secondary | ICD-10-CM | POA: Diagnosis not present

## 2017-02-16 NOTE — Telephone Encounter (Signed)
02/16/17   TCM [facility]  Transition Care Management Follow-up Telephone Call  ADMISSION DATE: 02/11/17  DISCHARGE DATE: 02/15/17   How have you been since you were released from the hospital? Patient states she feels fine   Do you understand why you were in the hospital? Yes   Do you understand the discharge instrcutions? Patient states she did not get discharge papers. May have given to [facility].    Items Reviewed:  Medications reviewed: Yes although patient states she dope not take some. Asked patient to bring medications in to OV   Allergies reviewed: Yes   Dietary changes reviewed: Low Sodium Heart Healthy   Referrals reviewed: Appointment scheduled with Dr. Lorelei Pont  Functional Questionnaire:   Activities of Daily Living (ADLs): Patient states she is able to perform all independently  Any patient concerns?  None at this time per patient   Confirmed importance and date/time of follow-up visits scheduled: Yes   Confirmed with patient if condition begins to worsen call PCP or go to the ER.  Yes    Patient was given the office number and encouragred to call back with questions or concerns. Yes

## 2017-02-16 NOTE — Telephone Encounter (Signed)
Pt called in to make nurse aware that they checked her INR at [facility] place today and it is 3.6

## 2017-02-17 LAB — CULTURE, BLOOD (ROUTINE X 2)
Culture: NO GROWTH
Culture: NO GROWTH
SPECIAL REQUESTS: ADEQUATE
Special Requests: ADEQUATE

## 2017-02-19 ENCOUNTER — Telehealth: Payer: Self-pay | Admitting: Family Medicine

## 2017-02-19 ENCOUNTER — Telehealth: Payer: Self-pay

## 2017-02-19 NOTE — Telephone Encounter (Signed)
Patient called in to report INR taken today = 1.7  States on 02/16/17 it was 3.6  Advised I would forward information to Dr. Lorelei Pont.

## 2017-02-19 NOTE — Telephone Encounter (Signed)
Copied from [place]. Topic: Quick Communication - See Telephone Encounter >> Feb 19, 2017  8:25 AM Oneta Rack wrote: CRM for notification. See Telephone encounter for:   02/19/17.  Relation to pt: self Call back number: [phone]  Reason for call:  Patient cancelled her INR check today due to her admitted to [facility] rehab

## 2017-02-19 NOTE — Telephone Encounter (Signed)
Called pt- she has been back on 4 mg/ day for a week now 28 mg/ week INR low today Will increase to 30 mg a week by increasing to 6 mg one day of the week They will repeat her INR in 2 weeks

## 2017-02-20 ENCOUNTER — Ambulatory Visit: Payer: Medicare Other

## 2017-02-20 DIAGNOSIS — R2681 Unsteadiness on feet: Secondary | ICD-10-CM | POA: Diagnosis not present

## 2017-02-20 DIAGNOSIS — G20 Parkinson's disease: Secondary | ICD-10-CM | POA: Diagnosis not present

## 2017-02-20 DIAGNOSIS — M542 Cervicalgia: Secondary | ICD-10-CM | POA: Diagnosis not present

## 2017-02-20 DIAGNOSIS — M25511 Pain in right shoulder: Secondary | ICD-10-CM | POA: Diagnosis not present

## 2017-02-20 DIAGNOSIS — G8929 Other chronic pain: Secondary | ICD-10-CM | POA: Diagnosis not present

## 2017-02-21 DIAGNOSIS — M25511 Pain in right shoulder: Secondary | ICD-10-CM | POA: Diagnosis not present

## 2017-02-21 DIAGNOSIS — I10 Essential (primary) hypertension: Secondary | ICD-10-CM | POA: Diagnosis not present

## 2017-02-21 DIAGNOSIS — M542 Cervicalgia: Secondary | ICD-10-CM | POA: Diagnosis not present

## 2017-02-21 DIAGNOSIS — E785 Hyperlipidemia, unspecified: Secondary | ICD-10-CM | POA: Diagnosis not present

## 2017-02-21 DIAGNOSIS — G20 Parkinson's disease: Secondary | ICD-10-CM | POA: Diagnosis not present

## 2017-02-21 DIAGNOSIS — R2681 Unsteadiness on feet: Secondary | ICD-10-CM | POA: Diagnosis not present

## 2017-02-21 DIAGNOSIS — G8929 Other chronic pain: Secondary | ICD-10-CM | POA: Diagnosis not present

## 2017-02-21 NOTE — Progress Notes (Deleted)
[name] [name] was seen today in the movement disorders clinic for neurologic consultation at the request of [name] [name], [name] Filler, MD.   This patient is accompanied in the office by her child who supplements the history.   Prior records made available to me were reviewed.  The patient first saw Dr. Leta [name] in March, 2016 at which point she was complaining about symptoms of diplopia and weakness.  Because of the diplopia she had acetylcholine receptor antibodies that were performed that were negative.  She has had an MRI and MRA of the brain with and without gadolinium.  I reviewed these images.  These were done on 05/21/2014.  This just revealed mild small vessel disease.  She also had a carotid ultrasound with him in March, 2016 revealed less than 50% stenosis bilaterally.  She followed up with him on 07/23/2014 and started on carbidopa/levodopa 25/100, one tablet 3 times per day (8am, 12pm, 5pm, before the meals).  She called back to him and complained about diffuse psain, headaches, depression and nausea.  Although he was not completely convinced that some of these things were not associated with her fibromyalgia and situational depression (her grandson was leaving for college and he serves as a caregiver), it was decided to taper off of her levodopa on 09/16/2014.   She did this and is now off the medication.  She states today that she initially felt great on the medication but then felt that it caused side effects.  Now that she is off of it, however, she c/o difficulty with walking. They also discussed referrals to pain management and psychiatry.  She presents today for a second opinion.  Pt states that diplopia was her first sx and then she began to notice falling.  This started in 04/2013.  The diplopia was horizontal in nature and she initially thought that it was due to lyrica.  The lyrica was d/c and it initally seemed to get better but then it came back.  It gets better if she closes an eye  (doesn't know if it matters which eye she closes).  She has some degree of diplopia daily but mostly at night.  She sees Dr. Arnoldo [name] at [facility] and was told that nothing was wrong with the eyes.    10/29/14 update:  The patient is following up today.  Overall, she states that she is feeling well.  She really hasn't had significant diplopia since our last visit.  She had her DaT scan on 10/14/2014 and while tracer uptake was present in the bilateral attainment, it was decreased on the right putamen relative to the right caudate, which can be seen in early parkinsonian syndromes.  The patient admits that she is still very slow.  She saw a different ophthalmologist [facility] imaging since our last visit and states that the diagnosis of glaucoma was retracted and everything looked well.  She denies any falls since our last visit no hallucinations.  12/10/14 update:  The patient is accompanied by her daughter who supplements the history.  I started the patient on carbidopa/levodopa 25/100 tid last visit.  Today, she states that she thinks that the medication makes her fibromyalgia worse but admits that it could be the weather.  Admits to a fall yesterday.   She was going into the house and there is an odd shaped stair (L shaped landing) and she fell backwards.   She called 911 and she went to [facility] and she had a CT brain and she states  that it was negative.  Had her INR checked and it was 2.6.  Does state that she was vomiting on way on the way to the hospital so they kept her for a few hours.  Still nauseated some today but has been nauseated even prior to that and wonders if the medication contributes.  She did not take any carbidopa/levodopa today.   03/09/14 update:  The patient follows up today.  Last visit, she complained about multiple side effects of levodopa including the fact that she thought that it made her fibromyalgia worse.  Although I told her that I was not convinced that it caused many of these  side effects, we ultimately decided to try rytary instead.  She started with 95 mg 3 times a day and worked up to 145 mg 3 times a day.  She called me on October 20 to state that she was doing well and then called back on November 11 to state that the medication was causing dizziness and diplopia.  She wanted to decrease the medication.  We explained that sometimes the disease can cause the symptoms and so I had her hold the medication for a few days and she stated that the dizziness and diplopia resolved.  She asked again to go back down to the 95 mg 3 times a day, which we ultimately did.  She feels well going to PT.  She is not exercising otherwise.  She asks me for a referral to pain management for her fibromyalgia.  She has not had falls.  No hallucinations.    07/13/15 update:  The patient follows up today.  This patient is accompanied in the office by her daughter who supplements the history.  She is on Rytary, 95 mg 3 times per day.  She doesn't think that it wears off.  It is very expensive but she didn't qualify for the [facility] program.  She did physical therapy since our last visit.  She was referred to pain management with Dr. Letta Pate.  I reviewed his records and appreciate the input.  She was started on Cymbalta and she states that she is doing well with that and ultram.  She felt great yesterday but doesn't today and attributes that to the rain.  No hallucinations.  Still with diplopia.  Daughter asks about referral to neuro-ophth.  10/21/15 update:  The patient follows up today, unaccompanied today.  She remains on Rytary, 95 mg 3 times per day.  Last visit, she wanted a referral to neuro-ophthalmology for her diplopia.  Her appt is next month.  She has not yet had that appt that she did call me and stated that she wanted to go off of the rytary and back on the older version of levodopa because she thought that the rytary was causing double vision.  We had explained to her that this just did not  make physiologic sense because they all essentially have the same ingredient.  Therefore, she is still on the rytary. She has to cover an eye (either) to see well.   Had a fall from bathroom into bedroom.  She bruised her arms.  One other fall that was very similar.  Has been "on an off" with physical therapy.  Has a stationary bike and rides that 3 times a week in the house.    01/25/16 update:  The patient follows up today.   She is accompanied by her daughter who supplement the history.   Much has happened since our last visit  and I reviewed her records.  She saw Dr. Hassell Done on 11/22/2015.  He repeated an acetylcholine receptor antibody and obtain a CT of the orbits. He sent the patient to see Jerre Simon, orthoptist, to see if prism would be of help with regard to her ocular motility disorder.  She had a carotid ultrasound on 10/28/2015 that demonstrated 1-39% stenosis bilaterally.  She was admitted to the hospital from October 13 to October 16 and then went to inpatient rehabilitation from October 16 to October 27.  She is now in subacute rehab.  She got up in the middle of the night to use the bathroom and lost her balance and fell through the sheet rock.  She sustained a moderately displaced fracture is seen involving the posterior spinous process of C5.  Daughter thinks more confused since fall.  Having trouble getting into phone so had to take passcode off of phone. Trouble paying bills.  Goal is to go back home independently but family doesn't want that.  Family would like assisted living with monitored meds.  Daughter doesn't think that meds were being taken correctly.  CT brain has been negative for bleed.  Saw Dr. Beverly Gust in hospital for brief neuropsych exam and she recommended complete neuropsych testing in 3 months.  Still on Rytary 95 mg tid.    07/12/16 update:  Patient seen today in follow-up.  She is accompanied by her daughter who supplements the history.  I have reviewed records since our last  visit.  She was in the hospital in February with Escherichia coli sepsis secondary to UTI.  She was discharged to [facility] for rehabilitation following that.  She was not there for long and then she went back to [place] where she lived. She has subsequently moved to [place].  She moved there last month.  She lives independently.  She does own pills but daughter prepares pill box.  Meals are prepared.  She does own showers.  She just finished PT.  Daughter states only 1 fall and that was when she had that UTI and she was confused.  She got tangled in her gown and then fell.   Remains on Rytary, and is only on 95 mg 3 times per day.  She was supposed to have repeat neuropsych testing, but canceled that.  It was scheduled for 04/05/2016.  02/22/17 update: Patient seen in follow-up today.  She is accompanied by her daughter who supplements the history.  I have reviewed numerous records since our last visit.  She is admitted to the hospital in September after a fall that occurred after she slid out of bed.  She was septic with UTI.  She was discharged on November 28, 2016.  She was seen back in the emergency room on December 07, 2016 after a fall backwards while making a sandwich in the kitchen.  Her INR was elevated at the time of fall at 3.6.  She did hold her Coumadin after that.  CT head was nonacute.  She was readmitted to the hospital on February 12, 2017.  She fell out of the bed (while awake and attempting to exit the bed) and was unable to get up.  Again, she had a UTI.  She was discharged to [facility] for rehab.  When here last visit, she was complaining about memory change and was supposed to have repeat neurocognitive testing.  She previously had it when in the hospital.  I encouraged her to reschedule that at our office.  She did  not do that.  She remains on Rytary, 95 mg 3 times per day.   PREVIOUS MEDICATIONS: Sinemet  ALLERGIES:   Allergies  Allergen Reactions  . Crestor [Rosuvastatin  Calcium] Other (See Comments)    Feeling very bad, aching all over.  . Erythromycin Hives  . Gabapentin Other (See Comments)    Blurred vision  . Pregabalin Other (See Comments)    Crossed vision.  . Carbidopa Other (See Comments)    Headaches, nausea, dizziness  . Macrobid [Nitrofurantoin] Hives  . Losartan Itching    CURRENT MEDICATIONS:  Outpatient Encounter Medications as of 02/22/2017  Medication Sig  . acetaminophen (TYLENOL) 500 MG tablet Take 500 mg by mouth every 6 (six) hours as needed for mild pain.  Marland Kitchen albuterol (PROVENTIL HFA;VENTOLIN HFA) 108 (90 Base) MCG/ACT inhaler Inhale 2 puffs into the lungs every 6 (six) hours as needed for wheezing or shortness of breath.  Marland Kitchen atorvastatin (LIPITOR) 10 MG tablet Take 1 tablet (10 mg total) by mouth daily.  . Carbidopa-Levodopa ER (RYTARY) 23.75-95 MG CPCR Take 1 tablet 3 (three) times daily by mouth.  . celecoxib (CELEBREX) 100 MG capsule Take 1 capsule (100 mg total) by mouth 2 (two) times daily.  . cetirizine (ZYRTEC) 10 MG tablet Take 1 tablet (10 mg total) by mouth daily.  . Cholecalciferol (VITAMIN D3) 5000 UNITS CAPS Take 5,000 Units by mouth daily.   . cyanocobalamin 500 MCG tablet Take 500 mcg by mouth daily.  . cycloSPORINE (RESTASIS) 0.05 % ophthalmic emulsion Place 1 drop into both eyes 2 (two) times daily.  . DULoxetine (CYMBALTA) 60 MG capsule Take 1 capsule (60 mg total) by mouth daily.  . fluticasone (FLONASE) 50 MCG/ACT nasal spray Place 1 spray into both nostrils daily.  . magnesium oxide (MAG-OX) 400 (241.3 Mg) MG tablet Take 0.5 tablets (200 mg total) by mouth daily.  . Melatonin 5 MG TABS Take 1 tablet by mouth at bedtime as needed (for sleep).  . Menthol, Topical Analgesic, (BIOFREEZE EX) Apply 1 application topically daily as needed (for pain).  . methocarbamol (ROBAXIN) 500 MG tablet Take 1 tablet (500 mg total) by mouth every 6 (six) hours as needed for muscle spasms. (Patient not taking: Reported on  02/16/2017)  . nicotine polacrilex (NICORETTE) 2 MG gum Take 2 mg by mouth as needed for smoking cessation.  . Omega-3 Fatty Acids (FISH OIL PO) Take 1 capsule by mouth daily.  . ondansetron (ZOFRAN ODT) 4 MG disintegrating tablet Take 1 tablet (4 mg total) by mouth every 8 (eight) hours as needed for nausea or vomiting. (Patient not taking: Reported on 02/16/2017)  . Oxycodone HCl 10 MG TABS Take 1/2 tablet daily as needed for neck pain. If pain persists may take another 1/2 tablet per day (Patient not taking: Reported on 02/16/2017)  . SYNTHROID 150 MCG tablet Take 1 tablet (150 mcg total) by mouth daily before breakfast.  . telmisartan-hydrochlorothiazide (MICARDIS HCT) 40-12.5 MG tablet Take 1 tablet by mouth daily.  Marland Kitchen warfarin (COUMADIN) 4 MG tablet Take 1 tablet (4 mg total) by mouth See admin instructions. Adjust as directed by MD (Patient taking differently: Take 4 mg by mouth daily. Adjust as directed by MD)   No facility-administered encounter medications on file as of 02/22/2017.     PAST MEDICAL HISTORY:   Past Medical History:  Diagnosis Date  . Arthritis   . Chronic insomnia   . Complication of anesthesia    severe itching night of surgery requiring meds- also  couldnt swallow- throat "paralyzed""  . Fibromyalgia   . Glaucoma   . Hypercholesterolemia   . Hyperlipidemia   . Hypertension    PCP Dr Cari Caraway- [place]  05/15/11 with clearance and note on chart,    chest x ray, EKG 12/12 [facility], eccho 7/11 [facility]  . Hypothyroidism    s/p graves disease  . Kidney stone   . PD (Parkinson's disease) ([place])   . Peripheral vascular disease ([place])    PULMONARY EMBOLUS x 2- 2011, 2012/ FOLLOWED BY DR [id number] 12/12 [facility]   PT [facility] WILL STOP COUMADIN 3/12 and begin LOVONOX 05/17/11 as previously instructed  . Sinus infection    at present- dr Honor Loh PA aware- was seen there and at PCP 05/10/11  . Thyroid disease    Hypothyroidism    PAST SURGICAL HISTORY:   Past Surgical History:    Procedure Laterality Date  . ABDOMINAL HYSTERECTOMY    . APPENDECTOMY    . CATARACT EXTRACTION Bilateral   . CHOLECYSTECTOMY    . COLONOSCOPY    . EXPLORATORY LAPAROTOMY    . JOINT REPLACEMENT     left knee  7/12  . TOTAL KNEE ARTHROPLASTY  05/22/2011   Procedure: TOTAL KNEE ARTHROPLASTY;  Surgeon: Mauri Pole, MD;  Location: [facility];  Service: Orthopedics;  Laterality: Right;    SOCIAL HISTORY:   Social History   Socioeconomic History  . Marital status: Widowed    Spouse name: Not on file  . Number of children: 2  . Years of education: Not on file  . Highest education level: Not on file  Social Needs  . Financial resource strain: Not on file  . Food insecurity - worry: Not on file  . Food insecurity - inability: Not on file  . Transportation needs - medical: Not on file  . Transportation needs - non-medical: Not on file  Occupational History  . Not on file  Tobacco Use  . Smoking status: Former Smoker    Packs/day: 1.00    Last attempt to quit: 05/10/1989    Years since quitting: 27.8  . Smokeless tobacco: Never Used  Substance and Sexual Activity  . Alcohol use: Yes    Alcohol/week: 0.0 oz    Comment: one drink once a month  . Drug use: No  . Sexual activity: Not on file  Other Topics Concern  . Not on file  Social History Narrative   Lives at home alone   Drinks caffeine occasionally     FAMILY HISTORY:   Family Status  Relation Name Status  . Mother  Deceased at age 74       peritonitis  . Father  Deceased at age 1       stroke, prostate cancer  . Sister  Alive       10 siblings - 1 cerebral palsy  . Brother  Alive    ROS:  A complete 10 system review of systems was obtained and was unremarkable apart from what is mentioned above.  PHYSICAL EXAMINATION:    VITALS:   There were no vitals filed for this visit. Wt Readings from Last 3 Encounters:  02/15/17 200 lb 6.4 oz (90.9 kg)  12/28/16 199 lb 9.6 oz (90.5 kg)  12/13/16 196 lb (88.9 kg)      GEN:  The patient appears stated age and is in NAD. HEENT:  Normocephalic, atraumatic.  CV:  Tachycardic.  Regular rhythm.   Lungs:  CTAB.   Neck/HEME:  There is  a soft right carotid bruit.   There is decreased range of motion of the neck.  She has difficulty turning the head to the right.  Neurological examination:  Orientation:  Montreal Cognitive Assessment  01/25/2016  Visuospatial/ Executive (0/5) 2  Naming (0/3) 3  Attention: Read list of digits (0/2) 2  Attention: Read list of letters (0/1) 0  Attention: Serial 7 subtraction starting at 100 (0/3) 2  Language: Repeat phrase (0/2) 2  Language : Fluency (0/1) 0  Abstraction (0/2) 2  Delayed Recall (0/5) 5  Orientation (0/6) 5  Total 23  Adjusted Score (based on education) 23   Cranial nerves: There is good facial symmetry.  There is facial hypomimia.   The speech is fluent and clear.  There are no square wave jerks.  EOMI.   She has no difficulty with the guttural sounds.  Soft palate rises symmetrically and there is no tongue deviation. Hearing is intact to conversational tone. Sensation: Sensation is intact to light touch throughout. Motor: Strength is 5/5 in the bilateral upper and lower extremities.   Shoulder shrug is equal and symmetric.  There is no pronator drift.  There are no fasciculations noted.   Movement examination: Tone: There is normal tone in the bilateral upper extremities.  The tone in the lower extremities is normal.  Abnormal movements: None, even with distraction procedures Coordination:  There is minimal decreased toe taps on the L Gait and Station: The patient walks with her regular walker today.  She shuffles after she has walked some.  ASSESSMENT/PLAN:  1.  MSA  -  She has some mild cervical dystonia (has difficulty turning the head to the right) and had early diplopia and falls.  Her DaT scan demonstrated decreased uptake of the tracer in the right putamen, but it was very mild.   I am not  convinced that everything that she reports as "side effects" of levodopa are really side effects.  She thought that it made her fibromyalgia worse.  She has also thought that rytary 145 mg tid caused diplopia and she backed down to 95 mg 3 times per day.   ***I discussed with her the fact that I am not sure that this is efficacious at this dose.  To be truthful, I am not sure it would be efficacious at higher dosages, but am definitely not sure of its efficacy at this dose.  -***Multiple falls since our last visit.  I had previously recommended a merry walker (many times) to the patient, but she has not gotten it despite the fact that I have written the prescription many times.  At this point, I told her that I do not think she should be walking at all.  I think she needs to stay seated.  I think that a motorized wheelchair would be appropriate.  She and I discussed this in detail.  She could not manipulate a manual wheelchair because of poor coordination and endurance.  She has not a candidate for a scooter because of inability to mount it and risk of falls.  -would recommend trying to get off ambien as many falls are in the nighttime and her daughter thinks Ambien is contributing.  -was supposed to have neuropsych testing in Jan but patient cancelled.  She had previously had testing in hospital and was recommended she f/u.  Told her to r/s 2.  Fall with fx of C5 spinous process  -did inpatient rehab stay after this.  Now doing subacute rehab  -  talked to her about assisted living facilities.   2.  Fibromyalgia  -seeing Dr. Letta Pate 3.  R carotid bruit  -had carotid u/s in 10/2015 that was normal.  ***I intended to reorder this at our last visit, but did not do that.  Will do that now. 4.  ***

## 2017-02-21 NOTE — Telephone Encounter (Signed)
Patient phoned to ask questions regarding her Coumadin dosage.  I read from latest information I could find which was a TC from Vernie Shanks, LPN on 09/23/80. Instructions given to patient verbatim that TC. She stated she understood and would increase the Coumadin to 6mg  just one day a week. She will take 4mg  the other 6 days. She has an appointment on 03/01/17.

## 2017-02-22 ENCOUNTER — Telehealth: Payer: Self-pay | Admitting: *Deleted

## 2017-02-22 ENCOUNTER — Ambulatory Visit: Payer: Medicare Other | Admitting: Neurology

## 2017-02-22 NOTE — Telephone Encounter (Signed)
Received Physician Orders from [facility]; forwarded to provider/SLS 12/20

## 2017-02-23 DIAGNOSIS — Z9181 History of falling: Secondary | ICD-10-CM | POA: Diagnosis not present

## 2017-02-23 DIAGNOSIS — R262 Difficulty in walking, not elsewhere classified: Secondary | ICD-10-CM | POA: Diagnosis not present

## 2017-02-23 DIAGNOSIS — M25511 Pain in right shoulder: Secondary | ICD-10-CM | POA: Diagnosis not present

## 2017-02-23 DIAGNOSIS — M6281 Muscle weakness (generalized): Secondary | ICD-10-CM | POA: Diagnosis not present

## 2017-02-23 DIAGNOSIS — R260 Ataxic gait: Secondary | ICD-10-CM | POA: Diagnosis not present

## 2017-02-23 DIAGNOSIS — M25512 Pain in left shoulder: Secondary | ICD-10-CM | POA: Diagnosis not present

## 2017-02-23 DIAGNOSIS — M542 Cervicalgia: Secondary | ICD-10-CM | POA: Diagnosis not present

## 2017-02-26 DIAGNOSIS — R262 Difficulty in walking, not elsewhere classified: Secondary | ICD-10-CM | POA: Diagnosis not present

## 2017-02-26 DIAGNOSIS — R260 Ataxic gait: Secondary | ICD-10-CM | POA: Diagnosis not present

## 2017-02-26 DIAGNOSIS — M25512 Pain in left shoulder: Secondary | ICD-10-CM | POA: Diagnosis not present

## 2017-02-26 DIAGNOSIS — M6281 Muscle weakness (generalized): Secondary | ICD-10-CM | POA: Diagnosis not present

## 2017-02-26 DIAGNOSIS — M25511 Pain in right shoulder: Secondary | ICD-10-CM | POA: Diagnosis not present

## 2017-02-26 DIAGNOSIS — M542 Cervicalgia: Secondary | ICD-10-CM | POA: Diagnosis not present

## 2017-02-27 NOTE — Progress Notes (Addendum)
[facility] [place] [facility] [address], [place], [place] [address] [phone]  Date:  03/01/2017   Name:  [name] [name]   DOB:  [birth year]   MRN:  [id number]  PCP:  Darreld Mclean, MD    Chief Complaint: No chief complaint on file.   History of Present Illness:  [name] [name] is a 71 y.o. very pleasant female patient who presents with the following:  Here today for a hospital follow-up visit Recently admitted with a UTI/ sepsis, and a fall as below  Admit date: 02/11/2017 Discharge date: 02/15/2017 Admitted From: ILF Disposition: SNF   Recommendations for Outpatient Follow-up:  1. Follow up with PCP in 1-2 weeks 2. Please obtain BMP, CBC, PT/INR in the next week 3. Complete antibiotics course for UTI with fosfomycin on 12/14, and again on 12/17. 4. Resume coumadin, per pharmacy recommendations, will need coumadin 2mg  po around 6pm 12/13, then 4mg  po daily thereafter.   Home Health: N/A Equipment/Devices: Per SNF Discharge Condition: Stable CODE STATUS: DNR Diet recommendation: Heart healthy  Brief/Interim Summary: [name] [name] a71 y.o.femalewith a history of PD, recurrent ESBL UTI's, HTN, HLD, fibromyalgia, and hypothyroidism who presented to the ED after a slip and fall while exiting bed at her independent living facility. She usually gets around with a walker and was unable to get up independently. On arrival she appeared confused, febrile to 101.4FwithWBC 21.2k and nitrite-positive pyuria on UA. CT head and CXR showed no acute abnormalities. Meropenem was given for UTI and she was admitted. Leukocytosis resolved and fever subsided. Mental status is clearing. Blood cultures have remained negative, though urine culture has confirmed ESBL E. coli. Meropenem is continued per ID recommendations with plans to transition to fosfomycin at discharge. PT has recommended short term rehabilitation prior to return to  previous venue.   Discharge Diagnoses:  Principal Problem:   Sepsis secondary to UTI [facility]) Active Problems:   Parkinson's disease ([place])   Benign essential HTN   Hypothyroidism   Fall   Elevated troponin   AKI (acute kidney injury) ([place]) Sepsis secondary toESBL E. coliUTI:Sepsis resolved.  - Given meropenem with resolution of acute metabolic encephalopathy. - Discussed with ID, Dr. Baxter Flattery who recommends fosfomycin the day after discharge and again 3 days thereafter.  Fall at ILF: No syncope. PD possibly contributing.  - PT/OT recommending SNF at discharge.  - Fall precautions Parkinson's disease: Chronic, stable. -Resume carbidopa and levodopa.  AKI:Due to dehydration. Improved with IV fluids.Cr 0.62 at discharge. H/o recurrent PE's: INR subtherapeutic. -Resume coumadin, per pharmacy recommendations, will need coumadin 2mg  po around 6pm 12/13, then 4mg  po daily thereafter.  - Monitor INR Hypothyroidism:TSH 0.847 - Continue synthroid Hypertension: Well controlled. - Heart healthy diet Hyperlipidemia: Chronic, stable - Continue statin Hypokalemia: Poor per oral intake, improved with replacement.  - Recommend recheck in the next week.  She is currently at the [facility], she is finished with her stay at rehab.  She was in rehab at [facility] until a week ago Her main concern today is that her neck hurts.  She notes that she "broke my neck last year", as below- She did fall down some steps and sustained a fracture of a cervical spinous process in October of 2017 IMPRESSION: Mild diffuse cortical atrophy. Mild chronic ischemic white matter disease. No acute intracranial abnormality seen.  Mild degenerative disc disease is noted at C6-7. Other degenerative changes are noted. Moderately displaced fracture is seen involving the posterior  spinous process of C5. Critical Value/emergent results were called by telephone at the time of interpretation on 12/17/2015 at 9:08 am to  Dr. Leo Grosser , who verbally acknowledged these Results.  Today she notes that her neck is hurting more since she was in the hospital- she fell this time as well and had a repeat CT of her head and neck which was negative as below.  She is getting PT right now for her neck and feels like this makes it hurt morer   IMPRESSION: CT HEAD: 1. No acute intracranial process. 2. Moderate to severe parenchymal brain volume loss and moderate chronic small vessel ischemic disease. CT CERVICAL SPINE: 1. No acute fracture or malalignment. 2. Severe LEFT C3-4 neural foraminal narrowing.  She is not having any urinary sx, no fever or flu like symptoms She is eating ok  She is using some tylenol for her neck that does help. She is taking 1 500 mg tylneol in the evening when her pain is worst- this does help a good bit  She notes aching into both arms but no numbness, tingling or weakness.  She was given some hydrocodone but she used these up- she jut got 6 tablets per her report.   No fever, no abd pain, she does have some burning with urination but states that "it burns but it always does."   She used up her robaxin- some more of this would be helpful to her   She has noted nausea but no vomiting  Patient Active Problem List   Diagnosis Date Noted  . Sepsis secondary to UTI ([place]) 02/12/2017  . Elevated troponin 02/12/2017  . AKI (acute kidney injury) ([place]) 02/12/2017  . Recurrent UTI 11/23/2016  . Fall at nursing home 11/23/2016  . PD (Parkinson's disease) ([place]) 11/23/2016  . Sepsis due to urinary tract infection ([place]) 11/23/2016  . Fibromyalgia 11/23/2016  . Hypercholesterolemia 11/23/2016  . Hypertension 11/23/2016  . Hypothyroidism, adult 11/23/2016  . Elevated CK 11/23/2016  . Pulmonary HTN ([place]) 11/23/2016  . Left ventricular diastolic dysfunction 39/76/7341  . Metabolic encephalopathy 93/79/0240  . History of pulmonary embolus (PE) 2/2 hypercoagulable state 11/23/2016  . Sepsis  ([place]) 09/17/2016  . Acute metabolic encephalopathy 97/35/3299  . Cellulitis of right foot 09/17/2016  . Fall   . UTI (urinary tract infection) 04/29/2016  . SIRS (systemic inflammatory response syndrome) ([place]) 04/29/2016  . Hyperlipidemia 04/29/2016  . Hypothyroidism 04/29/2016  . Hematuria 04/05/2016  . Cough 04/05/2016  . Benign essential HTN   . Intractable pain 12/17/2015  . Displaced fracture of fifth cervical vertebra ([place]) 12/17/2015  . Gait disturbance 12/17/2015  . Trochanteric bursitis of both hips 09/13/2015  . Chronic low back pain 06/07/2015  . Fibromyalgia syndrome 05/17/2015  . Thyroid disease 05/17/2015  . Parkinson's disease ([place]) 05/17/2015  . Hypercoagulable state ([place]) 02/07/2012  . History of pulmonary embolism 02/07/2012  . S/P right TKA 05/22/2011    Past Medical History:  Diagnosis Date  . Arthritis   . Chronic insomnia   . Complication of anesthesia    severe itching night of surgery requiring meds- also couldnt swallow- throat "paralyzed""  . Fibromyalgia   . Glaucoma   . Hypercholesterolemia   . Hyperlipidemia   . Hypertension    PCP Dr Cari Caraway- [place]  05/15/11 with clearance and note on chart,    chest x ray, EKG 12/12 EP[facility], eccho 7/11 [facility]  . Hypothyroidism    s/p graves disease  . Kidney stone   .  PD (Parkinson's disease) ([place])   . Peripheral vascular disease ([place])    PULMONARY EMBOLUS x 2- 2011, 2012/ FOLLOWED BY DR [id number] 12/12 EP[facility]   PT [facility]AES WILL STOP COUMADIN 3/12 and begin LOVONOX 05/17/11 as previously instructed  . Sinus infection    at present- dr Honor Loh PA aware- was seen there and at PCP 05/10/11  . Thyroid disease    Hypothyroidism    Past Surgical History:  Procedure Laterality Date  . ABDOMINAL HYSTERECTOMY    . APPENDECTOMY    . CATARACT EXTRACTION Bilateral   . CHOLECYSTECTOMY    . COLONOSCOPY    . EXPLORATORY LAPAROTOMY    . JOINT REPLACEMENT     left knee  7/12  . TOTAL KNEE ARTHROPLASTY  05/22/2011    Procedure: TOTAL KNEE ARTHROPLASTY;  Surgeon: Mauri Pole, MD;  Location: [facility] ORS;  Service: Orthopedics;  Laterality: Right;    Social History   Tobacco Use  . Smoking status: Former Smoker    Packs/day: 1.00    Last attempt to quit: 05/10/1989    Years since quitting: 27.8  . Smokeless tobacco: Never Used  Substance Use Topics  . Alcohol use: Yes    Alcohol/week: 0.0 oz    Comment: one drink once a month  . Drug use: No    Family History  Problem Relation Age of Onset  . Prostate cancer Father   . Stroke Father     Allergies  Allergen Reactions  . Crestor [Rosuvastatin Calcium] Other (See Comments)    Feeling very bad, aching all over.  . Erythromycin Hives  . Gabapentin Other (See Comments)    Blurred vision  . Pregabalin Other (See Comments)    Crossed vision.  . Carbidopa Other (See Comments)    Headaches, nausea, dizziness  . Macrobid [Nitrofurantoin] Hives  . Losartan Itching    Medication list has been reviewed and updated.  Current Outpatient Medications on File Prior to Visit  Medication Sig Dispense Refill  . acetaminophen (TYLENOL) 500 MG tablet Take 500 mg by mouth every 6 (six) hours as needed for mild pain.    Marland Kitchen albuterol (PROVENTIL HFA;VENTOLIN HFA) 108 (90 Base) MCG/ACT inhaler Inhale 2 puffs into the lungs every 6 (six) hours as needed for wheezing or shortness of breath. 1 Inhaler 0  . atorvastatin (LIPITOR) 10 MG tablet Take 1 tablet (10 mg total) by mouth daily. 90 tablet 3  . Carbidopa-Levodopa ER (RYTARY) 23.75-95 MG CPCR Take 1 tablet 3 (three) times daily by mouth. 90 capsule 0  . celecoxib (CELEBREX) 100 MG capsule Take 1 capsule (100 mg total) by mouth 2 (two) times daily. 180 capsule 3  . cetirizine (ZYRTEC) 10 MG tablet Take 1 tablet (10 mg total) by mouth daily. 30 tablet 1  . Cholecalciferol (VITAMIN D3) 5000 UNITS CAPS Take 5,000 Units by mouth daily.     . cyanocobalamin 500 MCG tablet Take 500 mcg by mouth daily.    . cycloSPORINE  (RESTASIS) 0.05 % ophthalmic emulsion Place 1 drop into both eyes 2 (two) times daily.    . DULoxetine (CYMBALTA) 60 MG capsule Take 1 capsule (60 mg total) by mouth daily. 90 capsule 3  . fluticasone (FLONASE) 50 MCG/ACT nasal spray Place 1 spray into both nostrils daily.    . magnesium oxide (MAG-OX) 400 (241.3 Mg) MG tablet Take 0.5 tablets (200 mg total) by mouth daily. 30 tablet 0  . Melatonin 5 MG TABS Take 1 tablet by mouth at bedtime as  needed (for sleep).    . Menthol, Topical Analgesic, (BIOFREEZE EX) Apply 1 application topically daily as needed (for pain).    . nicotine polacrilex (NICORETTE) 2 MG gum Take 2 mg by mouth as needed for smoking cessation.    . Omega-3 Fatty Acids (FISH OIL PO) Take 1 capsule by mouth daily.    . ondansetron (ZOFRAN ODT) 4 MG disintegrating tablet Take 1 tablet (4 mg total) by mouth every 8 (eight) hours as needed for nausea or vomiting. (Patient not taking: Reported on 02/16/2017) 20 tablet 0  . Oxycodone HCl 10 MG TABS Take 1/2 tablet daily as needed for neck pain. If pain persists may take another 1/2 tablet per day (Patient not taking: Reported on 02/16/2017) 5 tablet 0  . SYNTHROID 150 MCG tablet Take 1 tablet (150 mcg total) by mouth daily before breakfast. 90 tablet 3  . telmisartan-hydrochlorothiazide (MICARDIS HCT) 40-12.5 MG tablet Take 1 tablet by mouth daily. 90 tablet 3  . warfarin (COUMADIN) 4 MG tablet Take 1 tablet (4 mg total) by mouth See admin instructions. Adjust as directed by MD (Patient taking differently: Take 4 mg by mouth daily. Adjust as directed by MD) 90 tablet 3   No current facility-administered medications on file prior to visit.     Review of Systems:  As per HPI- otherwise negative. No fever or chills  Physical Examination: Vitals:   03/01/17 1155  BP: 109/60  Pulse: (!) 110  Resp: 18  Temp: 97.7 F (36.5 C)  SpO2: 99%   Vitals:   03/01/17 1155  Weight: 192 lb 9.6 oz (87.4 kg)  Height: 5\' 6"  (1.676 m)    Body mass index is 31.09 kg/m. Ideal Body Weight: Weight in (lb) to have BMI = 25: 154.6  GEN: WDWN, NAD, Non-toxic, A & O x 3, overweight, looks well and her normal self HEENT: Atraumatic, Normocephalic. Neck supple. No masses, No LAD.  Bilateral TM wnl, oropharynx normal.  PEERL,EOMI.   She has no bony cervical spine tenderness However she does have tenderness in the Paraspinous neck muscles bilaterally, and in her superior trapezius muscles bilaterally No redness or swelling, no skin lesion Her cervical ROM is ok, but a bit restricted to lateral rotation bilaterally, just 45 degrees  Ears and Nose: No external deformity. CV: RRR- pulse rate 80-90 on my exam, No M/G/R. No JVD. No thrill. No extra heart sounds. PULM: CTA B, no wheezes, crackles, rhonchi. No retractions. No resp. distress. No accessory muscle use. ABD: S, NT, ND EXTR: No c/c/e NEURO Normal gait for pt- uses a walker and has some kyphosis  Both upper limbs are very strong to strength testing today PSYCH: Normally interactive. Conversant. Not depressed or anxious appearing.  Calm demeanor.    Assessment and Plan: Long term (current) use of anticoagulants - Plan: CBC, INR/PT, CANCELED: POCT INR  Acute renal insufficiency - Plan: Basic metabolic panel, CBC  Acute cystitis without hematuria - Plan: Urine Culture  Chronic neck pain - Plan: methocarbamol (ROBAXIN) 500 MG tablet, DISCONTINUED: methocarbamol (ROBAXIN) 500 MG tablet  Need for tetanus, diphtheria, and acellular pertussis (Tdap) vaccine - Plan: Tdap vaccine greater than or equal to 7yo IM  Here today to follow-up on a couple of things Recent fall and admission for urosepsis We will repeat her urine culture today to make sure all is cleared Will check her CBC and BMP today as requested by discharge summary Discussed her neck pain in detail with pt.  On exam this  seems to be a muscular issue.  Called radiology who went over and double checked her recent head  and neck CT scans- nothing found of concern.  Will increase her tylenol dose as needed and also refilled her robaxin.   Discussed small possibility of a head bleed that was not seen on original CT from 12/9 and offered to repeat this scan.  However, we also discussed her many recent head and neck CT scans and possible radiation risks For the time being she does not wish to have a repeat scan but will keep me closely apprised as to her progress   Signed Lamar Blinks, MD  Call pt- did she get a tetanus shot?    Received her labs so far, 12/28.  Called- no answer right now.  Will try back  Tried again 12/28- no answer 12/29- Received urine culture final result- will need to try treating with fosfomycin again Called her pharmacy and rx this medication.  Have called pt again but no answer, cannot LM Called again 4pm-  Was able to reach her- let her know that her urine is still infected.  She does not think she received or took the two additional doses of fosfomycin as referenced in her discharge summary: Complete antibiotics course for UTI with fosfomycin on 12/14, and again on 12/17. Let her know that I called in fosfomycin for her to take- please take today.  She will do so and I will call and check on her tomorrow   Results for orders placed or performed in visit on 03/01/17  Urine Culture  Result Value Ref Range   MICRO NUMBER: [id number]    SPECIMEN QUALITY: ADEQUATE    Sample Source NOT GIVEN    STATUS: FINAL    ISOLATE 1: ESBL Escherichia coli (A)       Susceptibility   Esbl escherichia coli - URINE CULTURE, REFLEX    AMOX/CLAVULANIC 16 Intermediate     AMPICILLIN* >=32 Resistant      * Extended spectrum beta-lactamase (ESBL) producingorganisms demonstrate decreased activity withpenicillins, cephalosporins and aztreonam.    AMPICILLIN/SULBACTAM >=32 Resistant     CEFAZOLIN* >=64 Resistant      * Extended spectrum beta-lactamase (ESBL) producingorganisms demonstrate decreased  activity withpenicillins, cephalosporins and aztreonam.For uncomplicated UTI caused by E. coli,K. pneumoniae or P. mirabilis: Cefazolin issusceptible if MIC <32 mcg/mL and predictssusceptible to the oral agents cefaclor, cefdinir,cefpodoxime, cefprozil, cefuroxime, cephalexinand loracarbef.    CEFEPIME >=64 Resistant     CEFTRIAXONE >=64 Resistant     CIPROFLOXACIN >=4 Resistant     LEVOFLOXACIN >=8 Resistant     ERTAPENEM <=0.5 Sensitive     GENTAMICIN <=1 Sensitive     IMIPENEM <=0.25 Sensitive     NITROFURANTOIN <=16 Sensitive     PIP/TAZO <=4 Sensitive     TOBRAMYCIN <=1 Sensitive     TRIMETH/SULFA* >=320 Resistant      * Extended spectrum beta-lactamase (ESBL) producingorganisms demonstrate decreased activity withpenicillins, cephalosporins and aztreonam.For uncomplicated UTI caused by E. coli,K. pneumoniae or P. mirabilis: Cefazolin issusceptible if MIC <32 mcg/mL and predictssusceptible to the oral agents cefaclor, cefdinir,cefpodoxime, cefprozil, cefuroxime, cephalexinand loracarbef.Legend:S = Susceptible  I = IntermediateR = Resistant  NS = Not susceptible* = Not tested  NR = Not reported**NN = See antimicrobic comments  Basic metabolic panel  Result Value Ref Range   Sodium 138 135 - 145 mEq/L   Potassium 4.9 3.5 - 5.1 mEq/L   Chloride 101 96 - 112 mEq/L   CO2 28 19 -  32 mEq/L   Glucose, Bld 123 (H) 70 - 99 mg/dL   BUN 14 6 - 23 mg/dL   Creatinine, Ser 1.06 0.40 - 1.20 mg/dL   Calcium 10.3 8.4 - 10.5 mg/dL   GFR 53.86 (L) >60.00 mL/min  CBC  Result Value Ref Range   WBC 10.5 4.0 - 10.5 K/uL   RBC 5.38 (H) 3.87 - 5.11 Mil/uL   Platelets 545.0 (H) 150.0 - 400.0 K/uL   Hemoglobin 15.4 (H) 12.0 - 15.0 g/dL   HCT 47.4 (H) 36.0 - 46.0 %   MCV 88.1 78.0 - 100.0 fl   MCHC 32.4 30.0 - 36.0 g/dL   RDW 14.9 11.5 - 15.5 %  INR/PT  Result Value Ref Range   INR 2.0 (H) 0.8 - 1.0 ratio   Prothrombin Time 21.5 (H) 9.6 - 13.1 sec

## 2017-02-28 ENCOUNTER — Telehealth: Payer: Self-pay | Admitting: *Deleted

## 2017-02-28 DIAGNOSIS — M25512 Pain in left shoulder: Secondary | ICD-10-CM | POA: Diagnosis not present

## 2017-02-28 DIAGNOSIS — M6281 Muscle weakness (generalized): Secondary | ICD-10-CM | POA: Diagnosis not present

## 2017-02-28 DIAGNOSIS — M25511 Pain in right shoulder: Secondary | ICD-10-CM | POA: Diagnosis not present

## 2017-02-28 DIAGNOSIS — R260 Ataxic gait: Secondary | ICD-10-CM | POA: Diagnosis not present

## 2017-02-28 DIAGNOSIS — M542 Cervicalgia: Secondary | ICD-10-CM | POA: Diagnosis not present

## 2017-02-28 DIAGNOSIS — R262 Difficulty in walking, not elsewhere classified: Secondary | ICD-10-CM | POA: Diagnosis not present

## 2017-02-28 NOTE — Telephone Encounter (Signed)
Received [place]; forwarded to provider/SLS 12/26

## 2017-03-01 ENCOUNTER — Encounter: Payer: Self-pay | Admitting: Family Medicine

## 2017-03-01 ENCOUNTER — Inpatient Hospital Stay: Payer: Medicare Other

## 2017-03-01 ENCOUNTER — Ambulatory Visit (INDEPENDENT_AMBULATORY_CARE_PROVIDER_SITE_OTHER): Payer: Medicare Other | Admitting: Family Medicine

## 2017-03-01 VITALS — BP 109/60 | HR 110 | Temp 97.7°F | Resp 18 | Ht 66.0 in | Wt 192.6 lb

## 2017-03-01 DIAGNOSIS — N289 Disorder of kidney and ureter, unspecified: Secondary | ICD-10-CM

## 2017-03-01 DIAGNOSIS — Z23 Encounter for immunization: Secondary | ICD-10-CM | POA: Diagnosis not present

## 2017-03-01 DIAGNOSIS — R262 Difficulty in walking, not elsewhere classified: Secondary | ICD-10-CM | POA: Diagnosis not present

## 2017-03-01 DIAGNOSIS — G8929 Other chronic pain: Secondary | ICD-10-CM | POA: Diagnosis not present

## 2017-03-01 DIAGNOSIS — N3000 Acute cystitis without hematuria: Secondary | ICD-10-CM

## 2017-03-01 DIAGNOSIS — Z7901 Long term (current) use of anticoagulants: Secondary | ICD-10-CM | POA: Diagnosis not present

## 2017-03-01 DIAGNOSIS — M542 Cervicalgia: Secondary | ICD-10-CM | POA: Diagnosis not present

## 2017-03-01 DIAGNOSIS — M25512 Pain in left shoulder: Secondary | ICD-10-CM | POA: Diagnosis not present

## 2017-03-01 DIAGNOSIS — R260 Ataxic gait: Secondary | ICD-10-CM | POA: Diagnosis not present

## 2017-03-01 DIAGNOSIS — M25511 Pain in right shoulder: Secondary | ICD-10-CM | POA: Diagnosis not present

## 2017-03-01 DIAGNOSIS — M6281 Muscle weakness (generalized): Secondary | ICD-10-CM | POA: Diagnosis not present

## 2017-03-01 LAB — BASIC METABOLIC PANEL
BUN: 14 mg/dL (ref 6–23)
CALCIUM: 10.3 mg/dL (ref 8.4–10.5)
CO2: 28 mEq/L (ref 19–32)
CREATININE: 1.06 mg/dL (ref 0.40–1.20)
Chloride: 101 mEq/L (ref 96–112)
GFR: 53.86 mL/min — AB (ref >60.00)
GLUCOSE: 123 mg/dL — AB (ref 70–99)
POTASSIUM: 4.9 meq/L (ref 3.5–5.1)
Sodium: 138 mEq/L (ref 135–145)

## 2017-03-01 LAB — CBC
HCT: 47.4 % — ABNORMAL HIGH (ref 36.0–46.0)
Hemoglobin: 15.4 g/dL — ABNORMAL HIGH (ref 12.0–15.0)
MCHC: 32.4 g/dL (ref 30.0–36.0)
MCV: 88.1 fl (ref 78.0–100.0)
Platelets: 545 10*3/uL — ABNORMAL HIGH (ref 150.0–400.0)
RBC: 5.38 Mil/uL — ABNORMAL HIGH (ref 3.87–5.11)
RDW: 14.9 % (ref 11.5–15.5)
WBC: 10.5 10*3/uL (ref 4.0–10.5)

## 2017-03-01 LAB — PROTIME-INR
INR: 2 ratio — AB (ref 0.8–1.0)
PROTHROMBIN TIME: 21.5 s — AB (ref 9.6–13.1)

## 2017-03-01 MED ORDER — METHOCARBAMOL 500 MG PO TABS: 500.0000 mg | ORAL_TABLET | Freq: Three times a day (TID) | ORAL | 0 refills | Status: DC | PRN

## 2017-03-01 NOTE — Patient Instructions (Addendum)
It was good to see you today- I will be in touch with your labs and INR as soon as possible For your neck pain, use the robaxin as needed Also, you can increase your tylenol dosage quite a bit. Ok to take up to 2 of the 500 mg tablets 3x a day   Please let me know if any worsening or other change of your symptoms, or if your neck is not getting better soon

## 2017-03-03 LAB — URINE CULTURE
MICRO NUMBER: 81452149
SPECIMEN QUALITY: ADEQUATE

## 2017-03-03 MED ORDER — FOSFOMYCIN TROMETHAMINE 3 G PO PACK: 3.0000 g | PACK | Freq: Once | ORAL | 0 refills | Status: AC

## 2017-03-03 NOTE — Addendum Note (Signed)
Addended by: Lamar Blinks C on: 03/03/2017 01:01 PM   Modules accepted: Orders

## 2017-03-05 ENCOUNTER — Telehealth: Payer: Self-pay | Admitting: *Deleted

## 2017-03-05 DIAGNOSIS — M6281 Muscle weakness (generalized): Secondary | ICD-10-CM | POA: Diagnosis not present

## 2017-03-05 DIAGNOSIS — M25512 Pain in left shoulder: Secondary | ICD-10-CM | POA: Diagnosis not present

## 2017-03-05 DIAGNOSIS — R262 Difficulty in walking, not elsewhere classified: Secondary | ICD-10-CM | POA: Diagnosis not present

## 2017-03-05 DIAGNOSIS — R260 Ataxic gait: Secondary | ICD-10-CM | POA: Diagnosis not present

## 2017-03-05 DIAGNOSIS — M25511 Pain in right shoulder: Secondary | ICD-10-CM | POA: Diagnosis not present

## 2017-03-05 DIAGNOSIS — M542 Cervicalgia: Secondary | ICD-10-CM | POA: Diagnosis not present

## 2017-03-05 NOTE — Telephone Encounter (Signed)
Received OT Plan of Care; forwarded to provider/SLS

## 2017-03-08 DIAGNOSIS — R262 Difficulty in walking, not elsewhere classified: Secondary | ICD-10-CM | POA: Diagnosis not present

## 2017-03-08 DIAGNOSIS — M25511 Pain in right shoulder: Secondary | ICD-10-CM | POA: Diagnosis not present

## 2017-03-08 DIAGNOSIS — M542 Cervicalgia: Secondary | ICD-10-CM | POA: Diagnosis not present

## 2017-03-08 DIAGNOSIS — Z9181 History of falling: Secondary | ICD-10-CM | POA: Diagnosis not present

## 2017-03-08 DIAGNOSIS — M25512 Pain in left shoulder: Secondary | ICD-10-CM | POA: Diagnosis not present

## 2017-03-08 DIAGNOSIS — M6281 Muscle weakness (generalized): Secondary | ICD-10-CM | POA: Diagnosis not present

## 2017-03-08 DIAGNOSIS — R260 Ataxic gait: Secondary | ICD-10-CM | POA: Diagnosis not present

## 2017-03-09 ENCOUNTER — Encounter: Payer: Self-pay | Admitting: Medical

## 2017-03-09 ENCOUNTER — Ambulatory Visit (INDEPENDENT_AMBULATORY_CARE_PROVIDER_SITE_OTHER): Payer: Medicare Other | Admitting: Medical

## 2017-03-09 VITALS — BP 110/72 | HR 110 | Temp 97.9°F | Resp 16 | Ht 66.0 in | Wt 190.0 lb

## 2017-03-09 DIAGNOSIS — A419 Sepsis, unspecified organism: Secondary | ICD-10-CM | POA: Diagnosis not present

## 2017-03-09 DIAGNOSIS — M542 Cervicalgia: Secondary | ICD-10-CM | POA: Diagnosis not present

## 2017-03-09 DIAGNOSIS — N390 Urinary tract infection, site not specified: Secondary | ICD-10-CM | POA: Diagnosis not present

## 2017-03-09 DIAGNOSIS — R300 Dysuria: Secondary | ICD-10-CM | POA: Diagnosis not present

## 2017-03-09 DIAGNOSIS — R197 Diarrhea, unspecified: Secondary | ICD-10-CM

## 2017-03-09 DIAGNOSIS — D72829 Elevated white blood cell count, unspecified: Secondary | ICD-10-CM | POA: Diagnosis not present

## 2017-03-09 DIAGNOSIS — R262 Difficulty in walking, not elsewhere classified: Secondary | ICD-10-CM | POA: Diagnosis not present

## 2017-03-09 DIAGNOSIS — M25512 Pain in left shoulder: Secondary | ICD-10-CM | POA: Diagnosis not present

## 2017-03-09 DIAGNOSIS — M6281 Muscle weakness (generalized): Secondary | ICD-10-CM | POA: Diagnosis not present

## 2017-03-09 DIAGNOSIS — R260 Ataxic gait: Secondary | ICD-10-CM | POA: Diagnosis not present

## 2017-03-09 DIAGNOSIS — M25511 Pain in right shoulder: Secondary | ICD-10-CM | POA: Diagnosis not present

## 2017-03-09 LAB — POC URINALSYSI DIPSTICK (AUTOMATED)
BILIRUBIN UA: NEGATIVE
GLUCOSE UA: NEGATIVE
Leukocytes, UA: NEGATIVE
Nitrite, UA: NEGATIVE
Urobilinogen, UA: NEGATIVE E.U./dL — AB
pH, UA: 6 (ref 5.0–8.0)

## 2017-03-09 LAB — CBC WITH DIFFERENTIAL/PLATELET
BASOS PCT: 1.1 %
Basophils Absolute: 108 cells/uL (ref 0–200)
Eosinophils Absolute: 255 cells/uL (ref 15–500)
Eosinophils Relative: 2.6 %
HEMATOCRIT: 43.5 % (ref 35.0–45.0)
Hemoglobin: 14.6 g/dL (ref 11.7–15.5)
Lymphs Abs: 3175 cells/uL (ref 850–3900)
MCH: 28.6 pg (ref 27.0–33.0)
MCHC: 33.6 g/dL (ref 32.0–36.0)
MCV: 85.3 fL (ref 80.0–100.0)
MPV: 10 fL (ref 7.5–12.5)
Monocytes Relative: 11.7 %
Neutro Abs: 5116 cells/uL (ref 1500–7800)
Neutrophils Relative %: 52.2 %
PLATELETS: 409 10*3/uL — AB (ref 140–400)
RBC: 5.1 10*6/uL (ref 3.80–5.10)
RDW: 13.4 % (ref 11.0–15.0)
TOTAL LYMPHOCYTE: 32.4 %
WBC: 9.8 10*3/uL (ref 3.8–10.8)
WBCMIX: 1147 {cells}/uL — AB (ref 200–950)

## 2017-03-09 NOTE — Progress Notes (Signed)
Subjective:    Patient ID: Decie Verne, female    DOB: Jul 08, 1945, 72 y.o.   MRN: [id number]  HPI  Pt in reporting she does have history of uti in the past.   Some time has severe infection in past. Has been hospitalized various times in past. Early December when admitted had fever of 101.4 and wbc of 21.2.  Treated and discharged.  Pt urine culture was positive and had ecoli. Was sensitive to macrobid and tobramycin(resistant to other oral meds). Just recently pt state Dr Lorelei Pont callled in fosfomycin(but on epic review Dr Bonner Puna ordered) and was using it daily for 3 days up until around Jan 1 st. Then stopped. She states medicine was bothering her stomach. She was taking med daily then realized was supposed to take 1 tab every 3rd day.   Pt just recently started to get burning on urination. Pt states she is not feeling well. Less energy and slight confusion. Processing slowly. In past this happens with her prior infections. No report of any gross motor or sensory function deficits.     Pt had loose stools. Some watery type for last 3 days. Bm 3-4 times a day.      Review of Systems  Constitutional: Positive for fatigue. Negative for chills and fever.  Respiratory: Negative for chest tightness, shortness of breath and wheezing.   Cardiovascular: Negative for chest pain and palpitations.  Gastrointestinal: Negative for abdominal pain.       Some loose stools. Mild watery.   Genitourinary: Positive for dysuria and frequency. Negative for decreased urine volume, difficulty urinating, pelvic pain, urgency, vaginal bleeding and vaginal pain.  Musculoskeletal: Negative for back pain.  Hematological: Negative for adenopathy. Does not bruise/bleed easily.  Psychiatric/Behavioral: Negative for behavioral problems and confusion.   Past Medical History:  Diagnosis Date  . Arthritis   . Chronic insomnia   . Complication of anesthesia    severe itching night of surgery requiring meds- also  couldnt swallow- throat "paralyzed""  . Fibromyalgia   . Glaucoma   . Hypercholesterolemia   . Hyperlipidemia   . Hypertension    PCP Dr Cari Caraway- [place]  05/15/11 with clearance and note on chart,    chest x ray, EKG 12/12 EPIC, eccho 7/11 EPIC  . Hypothyroidism    s/p graves disease  . Kidney stone   . PD (Parkinson's disease) ([place])   . Peripheral vascular disease ([place])    PULMONARY EMBOLUS x 2- 2011, 2012/ FOLLOWED BY DR ODOGWU-LOV 12/12 EPIC   PT STAES WILL STOP COUMADIN 3/12 and begin LOVONOX 05/17/11 as previously instructed  . Sinus infection    at present- dr Honor Loh PA aware- was seen there and at PCP 05/10/11  . Thyroid disease    Hypothyroidism     Social History   Socioeconomic History  . Marital status: Widowed    Spouse name: Not on file  . Number of children: 2  . Years of education: Not on file  . Highest education level: Not on file  Social Needs  . Financial resource strain: Not on file  . Food insecurity - worry: Not on file  . Food insecurity - inability: Not on file  . Transportation needs - medical: Not on file  . Transportation needs - non-medical: Not on file  Occupational History  . Not on file  Tobacco Use  . Smoking status: Former Smoker    Packs/day: 1.00    Last attempt to quit: 05/10/1989  Years since quitting: 27.8  . Smokeless tobacco: Never Used  Substance and Sexual Activity  . Alcohol use: Yes    Alcohol/week: 0.0 oz    Comment: one drink once a month  . Drug use: No  . Sexual activity: Not on file  Other Topics Concern  . Not on file  Social History Narrative   Lives at home alone   Drinks caffeine occasionally     Past Surgical History:  Procedure Laterality Date  . ABDOMINAL HYSTERECTOMY    . APPENDECTOMY    . CATARACT EXTRACTION Bilateral   . CHOLECYSTECTOMY    . COLONOSCOPY    . EXPLORATORY LAPAROTOMY    . JOINT REPLACEMENT     left knee  7/12  . TOTAL KNEE ARTHROPLASTY  05/22/2011   Procedure: TOTAL KNEE  ARTHROPLASTY;  Surgeon: Mauri Pole, MD;  Location: [facility];  Service: Orthopedics;  Laterality: Right;    Family History  Problem Relation Age of Onset  . Prostate cancer Father   . Stroke Father     Allergies  Allergen Reactions  . Crestor [Rosuvastatin Calcium] Other (See Comments)    Feeling very bad, aching all over.  . Erythromycin Hives  . Gabapentin Other (See Comments)    Blurred vision  . Pregabalin Other (See Comments)    Crossed vision.  . Carbidopa Other (See Comments)    Headaches, nausea, dizziness  . Macrobid [Nitrofurantoin] Hives  . Losartan Itching    Current Outpatient Medications on File Prior to Visit  Medication Sig Dispense Refill  . acetaminophen (TYLENOL) 500 MG tablet Take 500 mg by mouth every 6 (six) hours as needed for mild pain.    Marland Kitchen albuterol (PROVENTIL HFA;VENTOLIN HFA) 108 (90 Base) MCG/ACT inhaler Inhale 2 puffs into the lungs every 6 (six) hours as needed for wheezing or shortness of breath. 1 Inhaler 0  . atorvastatin (LIPITOR) 10 MG tablet Take 1 tablet (10 mg total) by mouth daily. 90 tablet 3  . Carbidopa-Levodopa ER (RYTARY) 23.75-95 MG CPCR Take 1 tablet 3 (three) times daily by mouth. 90 capsule 0  . celecoxib (CELEBREX) 100 MG capsule Take 1 capsule (100 mg total) by mouth 2 (two) times daily. 180 capsule 3  . cetirizine (ZYRTEC) 10 MG tablet Take 1 tablet (10 mg total) by mouth daily. 30 tablet 1  . Cholecalciferol (VITAMIN D3) 5000 UNITS CAPS Take 5,000 Units by mouth daily.     . cyanocobalamin 500 MCG tablet Take 500 mcg by mouth daily.    . cycloSPORINE (RESTASIS) 0.05 % ophthalmic emulsion Place 1 drop into both eyes 2 (two) times daily.    . DULoxetine (CYMBALTA) 60 MG capsule Take 1 capsule (60 mg total) by mouth daily. 90 capsule 3  . fluticasone (FLONASE) 50 MCG/ACT nasal spray Place 1 spray into both nostrils daily.    . magnesium oxide (MAG-OX) 400 (241.3 Mg) MG tablet Take 0.5 tablets (200 mg total) by mouth daily. 30  tablet 0  . Melatonin 5 MG TABS Take 1 tablet by mouth at bedtime as needed (for sleep).    . Menthol, Topical Analgesic, (BIOFREEZE EX) Apply 1 application topically daily as needed (for pain).    . methocarbamol (ROBAXIN) 500 MG tablet Take 1 tablet (500 mg total) by mouth every 8 (eight) hours as needed for muscle spasms. 30 tablet 0  . nicotine polacrilex (NICORETTE) 2 MG gum Take 2 mg by mouth as needed for smoking cessation.    . Omega-3 Fatty Acids (  FISH OIL PO) Take 1 capsule by mouth daily.    . ondansetron (ZOFRAN ODT) 4 MG disintegrating tablet Take 1 tablet (4 mg total) by mouth every 8 (eight) hours as needed for nausea or vomiting. 20 tablet 0  . Oxycodone HCl 10 MG TABS Take 1/2 tablet daily as needed for neck pain. If pain persists may take another 1/2 tablet per day 5 tablet 0  . SYNTHROID 150 MCG tablet Take 1 tablet (150 mcg total) by mouth daily before breakfast. 90 tablet 3  . telmisartan-hydrochlorothiazide (MICARDIS HCT) 40-12.5 MG tablet Take 1 tablet by mouth daily. 90 tablet 3  . warfarin (COUMADIN) 4 MG tablet Take 1 tablet (4 mg total) by mouth See admin instructions. Adjust as directed by MD (Patient taking differently: Take 4 mg by mouth daily. Adjust as directed by MD) 90 tablet 3   No current facility-administered medications on file prior to visit.     BP 110/72   Pulse (!) 110   Temp 97.9 F (36.6 C) (Oral)   Resp 16   Ht 5\' 6"  (1.676 m)   Wt 190 lb (86.2 kg)   SpO2 99%   BMI 30.67 kg/m       Objective:   Physical Exam   General Appearance- Not in acute distress.  HEENT Eyes- Scleraeral/Conjuntiva-bilat- Not Yellow. Mouth & Throat- Normal.  Chest and Lung Exam Auscultation: Breath sounds:-Normal. Adventitious sounds:- No Adventitious sounds.  Cardiovascular Auscultation:Rythm - Regular. Heart Sounds -Normal heart sounds.  Abdomen Inspection:-Inspection Normal.  Palpation/Perucssion: Palpation and Percussion of the abdomen reveal- Non  Tender, No Rebound tenderness, No rigidity(Guarding) and No Palpable abdominal masses.  Liver:-Normal.  Spleen:- Normal.   Back- no cva tendnerness     Assessment & Plan:  You do have recent urinary infection with resistant bacteria. But senstitive to mycin antibitoic. Use monurol today. Then might repeat on Tuesday when culture results are in. Might need iv antibiotics. If symptoms worsen or change then ED evaluation.  Please collect stool panel studies and turn in on Monday. Use probiotics otc. Bland foods and hydrate well.  Can contact you on my chart with results. You can notify me as well how you are feeling.  Follow up on Tuesday or as needed  Nickolis Diel, [place], [place]

## 2017-03-09 NOTE — Patient Instructions (Addendum)
You do have recent urinary infection with resistant bacteria. But senstitive to mycin antibitoic. Use monurol today. Then might repeat on Tuesday when culture results are in. Might need iv antibiotics. If symptoms worsen or change then ED evaluation.  Please collect stool panel studies and turn in on Monday. Use probiotics otc. Bland foods and hydrate well.  Can contact you on my chart with results. You can notify me as well how you are feeling.  Follow up on Tuesday or as needed

## 2017-03-10 LAB — COMPREHENSIVE METABOLIC PANEL
AG RATIO: 1.4 (calc) (ref 1.0–2.5)
ALKALINE PHOSPHATASE (APISO): 123 U/L (ref 33–130)
ALT: 26 U/L (ref 6–29)
AST: 24 U/L (ref 10–35)
Albumin: 4.2 g/dL (ref 3.6–5.1)
BILIRUBIN TOTAL: 0.5 mg/dL (ref 0.2–1.2)
BUN / CREAT RATIO: 13 (calc) (ref 6–22)
BUN: 13 mg/dL (ref 7–25)
CALCIUM: 10.5 mg/dL — AB (ref 8.6–10.4)
CO2: 30 mmol/L (ref 20–32)
Chloride: 103 mmol/L (ref 98–110)
Creat: 0.98 mg/dL — ABNORMAL HIGH (ref 0.60–0.93)
GLUCOSE: 139 mg/dL — AB (ref 65–99)
Globulin: 3 g/dL (calc) (ref 1.9–3.7)
Potassium: 5.1 mmol/L (ref 3.5–5.3)
Sodium: 143 mmol/L (ref 135–146)
Total Protein: 7.2 g/dL (ref 6.1–8.1)

## 2017-03-10 LAB — URINE CULTURE
MICRO NUMBER: 90015041
SPECIMEN QUALITY: ADEQUATE

## 2017-03-12 DIAGNOSIS — M6281 Muscle weakness (generalized): Secondary | ICD-10-CM | POA: Diagnosis not present

## 2017-03-12 DIAGNOSIS — R260 Ataxic gait: Secondary | ICD-10-CM | POA: Diagnosis not present

## 2017-03-12 DIAGNOSIS — M25511 Pain in right shoulder: Secondary | ICD-10-CM | POA: Diagnosis not present

## 2017-03-12 DIAGNOSIS — M542 Cervicalgia: Secondary | ICD-10-CM | POA: Diagnosis not present

## 2017-03-12 DIAGNOSIS — R262 Difficulty in walking, not elsewhere classified: Secondary | ICD-10-CM | POA: Diagnosis not present

## 2017-03-12 DIAGNOSIS — M25512 Pain in left shoulder: Secondary | ICD-10-CM | POA: Diagnosis not present

## 2017-03-13 ENCOUNTER — Encounter: Payer: Self-pay | Admitting: Family Medicine

## 2017-03-14 ENCOUNTER — Telehealth: Payer: Self-pay | Admitting: Medical

## 2017-03-14 DIAGNOSIS — R260 Ataxic gait: Secondary | ICD-10-CM | POA: Diagnosis not present

## 2017-03-14 DIAGNOSIS — M542 Cervicalgia: Secondary | ICD-10-CM | POA: Diagnosis not present

## 2017-03-14 DIAGNOSIS — M6281 Muscle weakness (generalized): Secondary | ICD-10-CM | POA: Diagnosis not present

## 2017-03-14 DIAGNOSIS — Z87440 Personal history of urinary (tract) infections: Secondary | ICD-10-CM

## 2017-03-14 DIAGNOSIS — M25511 Pain in right shoulder: Secondary | ICD-10-CM | POA: Diagnosis not present

## 2017-03-14 DIAGNOSIS — M25512 Pain in left shoulder: Secondary | ICD-10-CM | POA: Diagnosis not present

## 2017-03-14 DIAGNOSIS — N3000 Acute cystitis without hematuria: Secondary | ICD-10-CM

## 2017-03-14 DIAGNOSIS — R262 Difficulty in walking, not elsewhere classified: Secondary | ICD-10-CM | POA: Diagnosis not present

## 2017-03-14 NOTE — Telephone Encounter (Signed)
Urine culture order placed.

## 2017-03-14 NOTE — Telephone Encounter (Signed)
I did talk with Dr. Lorelei Pont and reviewed the urine culture results with her.  Discussed with her the fact that [name] [name]/patient told Rod Holler that she was feeling better.  Also showed Dr. Lorelei Pont daughter's email which he expressed concern about potential residual infection.  Dr. Lorelei Pont thinks that she does not need an appointment but she can come in to give another urine sample that we will culture.  Her last culture did not indicate need for antibiotics.  But with her severe UTI history repeat culture is reasonable.  We will also give a couple of other sterile cups for the future as well.  Please notify patient and try to give them a time/appointment in the lab if necessary.

## 2017-03-14 NOTE — Telephone Encounter (Signed)
Per chart pt talked with Rod Holler and reported she was better. Culture did not grow out defiinite infection but normal flora or contamination.   In light of these results if daughter is concerned would try to get her in with Dr. Lorelei Pont tomorrow. (with her history maybe urine has changed and repeat sample would be beneficial  Let me know if pt able to come in with Dr. Lorelei Pont tomorrow.

## 2017-03-14 NOTE — Telephone Encounter (Signed)
[facility] and spoke with her- [name] is looking better, she seems to be thinking normally and feeling well They will not bring her in for a recheck or a repeat urine at this time However we may want to consider vaginal estrogen for her due to frequent UTI- they may bring [name] [name] in at their convenience for a pelvic exam and to discuss this idea

## 2017-03-14 NOTE — Telephone Encounter (Signed)
Spoke with patients Daughter MONICA Jack Quarto [phone] she states if [name] feels that it is necessary to have patient come in for another visit with her & repeat urine. Daughter states that she would like clarification from [name] regarding antibiotic that was given. Please advise patient & daughter. Thanks

## 2017-03-15 ENCOUNTER — Other Ambulatory Visit: Payer: Medicare Other

## 2017-03-15 ENCOUNTER — Telehealth: Payer: Self-pay | Admitting: *Deleted

## 2017-03-15 DIAGNOSIS — R260 Ataxic gait: Secondary | ICD-10-CM | POA: Diagnosis not present

## 2017-03-15 DIAGNOSIS — M542 Cervicalgia: Secondary | ICD-10-CM | POA: Diagnosis not present

## 2017-03-15 DIAGNOSIS — M25512 Pain in left shoulder: Secondary | ICD-10-CM | POA: Diagnosis not present

## 2017-03-15 DIAGNOSIS — R262 Difficulty in walking, not elsewhere classified: Secondary | ICD-10-CM | POA: Diagnosis not present

## 2017-03-15 DIAGNOSIS — M6281 Muscle weakness (generalized): Secondary | ICD-10-CM | POA: Diagnosis not present

## 2017-03-15 DIAGNOSIS — M25511 Pain in right shoulder: Secondary | ICD-10-CM | POA: Diagnosis not present

## 2017-03-15 NOTE — Telephone Encounter (Signed)
Received Courtesy Copy of [facility] lab results; forwarded to provider/SLS

## 2017-03-20 DIAGNOSIS — M6281 Muscle weakness (generalized): Secondary | ICD-10-CM | POA: Diagnosis not present

## 2017-03-20 DIAGNOSIS — R262 Difficulty in walking, not elsewhere classified: Secondary | ICD-10-CM | POA: Diagnosis not present

## 2017-03-20 DIAGNOSIS — M542 Cervicalgia: Secondary | ICD-10-CM | POA: Diagnosis not present

## 2017-03-20 DIAGNOSIS — M25512 Pain in left shoulder: Secondary | ICD-10-CM | POA: Diagnosis not present

## 2017-03-20 DIAGNOSIS — M25511 Pain in right shoulder: Secondary | ICD-10-CM | POA: Diagnosis not present

## 2017-03-20 DIAGNOSIS — R260 Ataxic gait: Secondary | ICD-10-CM | POA: Diagnosis not present

## 2017-03-22 ENCOUNTER — Ambulatory Visit (INDEPENDENT_AMBULATORY_CARE_PROVIDER_SITE_OTHER): Payer: Medicare Other | Admitting: Medical

## 2017-03-22 ENCOUNTER — Encounter: Payer: Self-pay | Admitting: Medical

## 2017-03-22 VITALS — BP 117/57 | HR 96 | Temp 97.8°F | Resp 16 | Ht 66.0 in | Wt 192.6 lb

## 2017-03-22 DIAGNOSIS — M25512 Pain in left shoulder: Secondary | ICD-10-CM | POA: Diagnosis not present

## 2017-03-22 DIAGNOSIS — R197 Diarrhea, unspecified: Secondary | ICD-10-CM

## 2017-03-22 DIAGNOSIS — M6281 Muscle weakness (generalized): Secondary | ICD-10-CM | POA: Diagnosis not present

## 2017-03-22 DIAGNOSIS — R260 Ataxic gait: Secondary | ICD-10-CM | POA: Diagnosis not present

## 2017-03-22 DIAGNOSIS — R262 Difficulty in walking, not elsewhere classified: Secondary | ICD-10-CM | POA: Diagnosis not present

## 2017-03-22 DIAGNOSIS — Z7901 Long term (current) use of anticoagulants: Secondary | ICD-10-CM | POA: Diagnosis not present

## 2017-03-22 DIAGNOSIS — M25511 Pain in right shoulder: Secondary | ICD-10-CM | POA: Diagnosis not present

## 2017-03-22 DIAGNOSIS — M542 Cervicalgia: Secondary | ICD-10-CM | POA: Diagnosis not present

## 2017-03-22 LAB — POCT INR: INR: 2.2

## 2017-03-22 MED ORDER — CYCLOBENZAPRINE HCL 5 MG PO TABS
5.0000 mg | ORAL_TABLET | Freq: Every day | ORAL | 0 refills | Status: DC
Start: 2017-03-22 — End: 2017-03-27

## 2017-03-22 NOTE — Progress Notes (Signed)
Subjective:    Patient ID: [name] [name], female    DOB: 07-19-1945, 72 y.o.   MRN: [id number]  HPI  Pt in for some recent diarrhea. Pt states upset stomach for 4 days. About 3 loose stools a day. Stools were watery.Pt has no nausea or vomiting past 24 hours. 2 days ago did vomit.  Today she reports a little bit of improvement.  No fevers, chills or sweats.  She does point out that last time I saw her she had some loose stools.  Had advised her to turn in stool panel kit but she states that her symptoms resolved.  However just 4 days ago symptoms recurred  Pt also report some neck pain. Oct 2017 fell and hit head. Ever since then she had neck pain. At end of day neck feels tight. Pt was on celebrex in past but was taken off by her pcp.Marland Kitchen Hx of compression fracture and degenerative changes. Pt has been using biofreeze and seems to help.  She has been on Robaxin and she thought that this was not helping much.  She does report remote history of being on Flexeril in the past and she thought that worked better.  Pt inr today 2.2.    Review of Systems  Constitutional: Negative for chills, fatigue and fever.  Respiratory: Negative for cough, chest tightness, shortness of breath and wheezing.   Cardiovascular: Negative for chest pain and palpitations.  Gastrointestinal: Positive for diarrhea, nausea and vomiting. Negative for abdominal pain.       No nausea vomiting today but she has had that.  Genitourinary: Negative for difficulty urinating, dysuria, pelvic pain and urgency.  Musculoskeletal: Positive for neck pain. Negative for back pain, gait problem and neck stiffness.  Skin: Negative for rash.  Neurological: Negative for dizziness, weakness, numbness and headaches.  Hematological: Negative for adenopathy.  Psychiatric/Behavioral: Negative for behavioral problems, confusion, hallucinations, self-injury and sleep disturbance. The patient is not nervous/anxious.     Past Medical History:    Diagnosis Date  . Arthritis   . Chronic insomnia   . Complication of anesthesia    severe itching night of surgery requiring meds- also couldnt swallow- throat "paralyzed""  . Fibromyalgia   . Glaucoma   . Hypercholesterolemia   . Hyperlipidemia   . Hypertension    PCP Dr Cari Caraway- [place]  05/15/11 with clearance and note on chart,    chest x ray, EKG 12/12 EPIC, eccho 7/11 EPIC  . Hypothyroidism    s/p graves disease  . Kidney stone   . PD (Parkinson's disease) ([place])   . Peripheral vascular disease ([place])    PULMONARY EMBOLUS x 2- 2011, 2012/ FOLLOWED BY DR ODOGWU-LOV 12/12 EPIC   PT STAES WILL STOP COUMADIN 3/12 and begin LOVONOX 05/17/11 as previously instructed  . Sinus infection    at present- dr Honor Loh PA aware- was seen there and at PCP 05/10/11  . Thyroid disease    Hypothyroidism     Social History   Socioeconomic History  . Marital status: Widowed    Spouse name: Not on file  . Number of children: 2  . Years of education: Not on file  . Highest education level: Not on file  Social Needs  . Financial resource strain: Not on file  . Food insecurity - worry: Not on file  . Food insecurity - inability: Not on file  . Transportation needs - medical: Not on file  . Transportation needs - non-medical: Not on file  Occupational History  . Not on file  Tobacco Use  . Smoking status: Former Smoker    Packs/day: 1.00    Last attempt to quit: 05/10/1989    Years since quitting: 27.8  . Smokeless tobacco: Never Used  Substance and Sexual Activity  . Alcohol use: Yes    Alcohol/week: 0.0 oz    Comment: one drink once a month  . Drug use: No  . Sexual activity: Not on file  Other Topics Concern  . Not on file  Social History Narrative   Lives at home alone   Drinks caffeine occasionally     Past Surgical History:  Procedure Laterality Date  . ABDOMINAL HYSTERECTOMY    . APPENDECTOMY    . CATARACT EXTRACTION Bilateral   . CHOLECYSTECTOMY    . COLONOSCOPY    .  EXPLORATORY LAPAROTOMY    . JOINT REPLACEMENT     left knee  7/12  . TOTAL KNEE ARTHROPLASTY  05/22/2011   Procedure: TOTAL KNEE ARTHROPLASTY;  Surgeon: Mauri Pole, MD;  Location: [facility];  Service: Orthopedics;  Laterality: Right;    Family History  Problem Relation Age of Onset  . Prostate cancer Father   . Stroke Father     Allergies  Allergen Reactions  . Crestor [Rosuvastatin Calcium] Other (See Comments)    Feeling very bad, aching all over.  . Erythromycin Hives  . Gabapentin Other (See Comments)    Blurred vision  . Pregabalin Other (See Comments)    Crossed vision.  . Carbidopa Other (See Comments)    Headaches, nausea, dizziness  . Macrobid [Nitrofurantoin] Hives  . Losartan Itching    Current Outpatient Medications on File Prior to Visit  Medication Sig Dispense Refill  . acetaminophen (TYLENOL) 500 MG tablet Take 500 mg by mouth every 6 (six) hours as needed for mild pain.    Marland Kitchen albuterol (PROVENTIL HFA;VENTOLIN HFA) 108 (90 Base) MCG/ACT inhaler Inhale 2 puffs into the lungs every 6 (six) hours as needed for wheezing or shortness of breath. 1 Inhaler 0  . atorvastatin (LIPITOR) 10 MG tablet Take 1 tablet (10 mg total) by mouth daily. 90 tablet 3  . Carbidopa-Levodopa ER (RYTARY) 23.75-95 MG CPCR Take 1 tablet 3 (three) times daily by mouth. 90 capsule 0  . celecoxib (CELEBREX) 100 MG capsule Take 1 capsule (100 mg total) by mouth 2 (two) times daily. 180 capsule 3  . cetirizine (ZYRTEC) 10 MG tablet Take 1 tablet (10 mg total) by mouth daily. 30 tablet 1  . Cholecalciferol (VITAMIN D3) 5000 UNITS CAPS Take 5,000 Units by mouth daily.     . cyanocobalamin 500 MCG tablet Take 500 mcg by mouth daily.    . cycloSPORINE (RESTASIS) 0.05 % ophthalmic emulsion Place 1 drop into both eyes 2 (two) times daily.    . DULoxetine (CYMBALTA) 60 MG capsule Take 1 capsule (60 mg total) by mouth daily. 90 capsule 3  . fluticasone (FLONASE) 50 MCG/ACT nasal spray Place 1 spray into  both nostrils daily.    . magnesium oxide (MAG-OX) 400 (241.3 Mg) MG tablet Take 0.5 tablets (200 mg total) by mouth daily. 30 tablet 0  . Melatonin 5 MG TABS Take 1 tablet by mouth at bedtime as needed (for sleep).    . Menthol, Topical Analgesic, (BIOFREEZE EX) Apply 1 application topically daily as needed (for pain).    . methocarbamol (ROBAXIN) 500 MG tablet Take 1 tablet (500 mg total) by mouth every 8 (eight) hours as  needed for muscle spasms. 30 tablet 0  . nicotine polacrilex (NICORETTE) 2 MG gum Take 2 mg by mouth as needed for smoking cessation.    . Omega-3 Fatty Acids (FISH OIL PO) Take 1 capsule by mouth daily.    . ondansetron (ZOFRAN ODT) 4 MG disintegrating tablet Take 1 tablet (4 mg total) by mouth every 8 (eight) hours as needed for nausea or vomiting. 20 tablet 0  . Oxycodone HCl 10 MG TABS Take 1/2 tablet daily as needed for neck pain. If pain persists may take another 1/2 tablet per day 5 tablet 0  . SYNTHROID 150 MCG tablet Take 1 tablet (150 mcg total) by mouth daily before breakfast. 90 tablet 3  . telmisartan-hydrochlorothiazide (MICARDIS HCT) 40-12.5 MG tablet Take 1 tablet by mouth daily. 90 tablet 3  . warfarin (COUMADIN) 4 MG tablet Take 1 tablet (4 mg total) by mouth See admin instructions. Adjust as directed by MD (Patient taking differently: Take 4 mg by mouth daily. Adjust as directed by MD) 90 tablet 3   No current facility-administered medications on file prior to visit.     BP (!) 117/57   Pulse 96   Temp 97.8 F (36.6 C) (Oral)   Resp 16   Ht _0  (1.676 m)   Wt 192 lb 9.6 oz (87.4 kg)   SpO2 96%   BMI 31.09 kg/m       Objective:   Physical Exam   General Mental Status- Alert. General Appearance- Not in acute distress.   Skin General: Color- Normal Color. Moisture- Normal Moisture.  Does not feel dry  Neck Carotid Arteries- Normal color. Moisture- Normal Moisture. No carotid bruits. No JVD. Bilateral tenderness to palpation over both  upper trapezius regions.  Tenderness seems to be at the insertion sites into the occipital region.  Muscles do feel tight.  Chest and Lung Exam Auscultation: Breath Sounds:-Normal.  Cardiovascular Auscultation:Rythm- Regular. Murmurs & Other Heart Sounds:Auscultation of the heart reveals- No Murmurs.  Abdomen Inspection:-Inspeection Normal. Palpation/Percussion:Note:No mass. Palpation and Percussion of the abdomen reveal- Non Tender, Non Distended + BS, no rebound or guarding.    Neurologic Cranial Nerve exam:- CN III-XII intact(No nystagmus), symmetric smile. .Strength:- 5/5 equal and symmetric strength both upper and lower extremities.     Assessment & Plan:  Your diarrhea recently improved since I last saw you but then reoccurred over the last 4 days.  I want you to make sure you stay well-hydrated.  I recommend you use propel fitness water.  Eat bland healthy foods.  Continue Imodium.  If your diarrhea persists by Monday then turn in stool panel kit.  We will go by the lab and get the kit today.  It would be better for you to return the study tomorrow but I understand you do not have a ride back up here.  For your history of neck pain, I am going to prescribe you Flexeril 5 mg tab to use at night only.  I want you to go ahead and stop Robaxin as both her muscle relaxants.  Be cautious with use of Flexeril at night when ambulating.  Also for your back pain you can try some lidocaine patches.  Follow-up in 7 days or as needed.  Sakshi Sermons, [place]ercell Miller, PA-C

## 2017-03-22 NOTE — Patient Instructions (Addendum)
Your diarrhea recently improved since I last saw you but then reoccurred over the last 4 days.  I want you to make sure you stay well-hydrated.  I recommend you use propel fitness water.  Eat bland healthy foods.  Continue Imodium.  If your diarrhea persists by Monday then turn in stool panel kit.  We will go by the lab and get the kit today.  It would be better for you to return the study tomorrow but I understand you do not have a ride back up here.  For your history of neck pain, I am going to prescribe you Flexeril 5 mg tab to use at night only.  I want you to go ahead and stop Robaxin as both her muscle relaxants.  Be cautious with use of Flexeril at night when ambulating.  Also for your back pain you can try some lidocaine patches.  Follow-up in 7 days or as needed. 

## 2017-03-23 DIAGNOSIS — M6281 Muscle weakness (generalized): Secondary | ICD-10-CM | POA: Diagnosis not present

## 2017-03-23 DIAGNOSIS — R260 Ataxic gait: Secondary | ICD-10-CM | POA: Diagnosis not present

## 2017-03-23 DIAGNOSIS — M25511 Pain in right shoulder: Secondary | ICD-10-CM | POA: Diagnosis not present

## 2017-03-23 DIAGNOSIS — R262 Difficulty in walking, not elsewhere classified: Secondary | ICD-10-CM | POA: Diagnosis not present

## 2017-03-23 DIAGNOSIS — M542 Cervicalgia: Secondary | ICD-10-CM | POA: Diagnosis not present

## 2017-03-23 DIAGNOSIS — M25512 Pain in left shoulder: Secondary | ICD-10-CM | POA: Diagnosis not present

## 2017-03-26 ENCOUNTER — Telehealth: Payer: Self-pay | Admitting: Family Medicine

## 2017-03-26 DIAGNOSIS — R260 Ataxic gait: Secondary | ICD-10-CM | POA: Diagnosis not present

## 2017-03-26 DIAGNOSIS — M542 Cervicalgia: Secondary | ICD-10-CM | POA: Diagnosis not present

## 2017-03-26 DIAGNOSIS — M25512 Pain in left shoulder: Secondary | ICD-10-CM | POA: Diagnosis not present

## 2017-03-26 DIAGNOSIS — R262 Difficulty in walking, not elsewhere classified: Secondary | ICD-10-CM | POA: Diagnosis not present

## 2017-03-26 DIAGNOSIS — M6281 Muscle weakness (generalized): Secondary | ICD-10-CM | POA: Diagnosis not present

## 2017-03-26 DIAGNOSIS — M25511 Pain in right shoulder: Secondary | ICD-10-CM | POA: Diagnosis not present

## 2017-03-26 NOTE — Telephone Encounter (Signed)
[name] [name] at pharmacy called in to be advised. She said that they received a Rx for cyclobenzaprine. She would like to be sure that it is okay to fill medication? She said that pt is currently on other medications that indicate that she has a heart concern she said that this medication is not good for pts to take if so.   Please advise.    CB: [phone]

## 2017-03-27 ENCOUNTER — Encounter: Payer: Self-pay | Admitting: Family Medicine

## 2017-03-27 NOTE — Telephone Encounter (Signed)
Called the pharmacy- pharmacist is concerned that flexeril is contraindicated with heart contractility issues and pt does have CHF.  We will go ahead and cancel this rx. I called pt to let her know, but no answer and no machine.  Will send her a mychart message

## 2017-03-28 DIAGNOSIS — R260 Ataxic gait: Secondary | ICD-10-CM | POA: Diagnosis not present

## 2017-03-28 DIAGNOSIS — M542 Cervicalgia: Secondary | ICD-10-CM | POA: Diagnosis not present

## 2017-03-28 DIAGNOSIS — M6281 Muscle weakness (generalized): Secondary | ICD-10-CM | POA: Diagnosis not present

## 2017-03-28 DIAGNOSIS — M25512 Pain in left shoulder: Secondary | ICD-10-CM | POA: Diagnosis not present

## 2017-03-28 DIAGNOSIS — R262 Difficulty in walking, not elsewhere classified: Secondary | ICD-10-CM | POA: Diagnosis not present

## 2017-03-28 DIAGNOSIS — M25511 Pain in right shoulder: Secondary | ICD-10-CM | POA: Diagnosis not present

## 2017-03-30 DIAGNOSIS — M542 Cervicalgia: Secondary | ICD-10-CM | POA: Diagnosis not present

## 2017-03-30 DIAGNOSIS — M6281 Muscle weakness (generalized): Secondary | ICD-10-CM | POA: Diagnosis not present

## 2017-03-30 DIAGNOSIS — M25511 Pain in right shoulder: Secondary | ICD-10-CM | POA: Diagnosis not present

## 2017-03-30 DIAGNOSIS — R262 Difficulty in walking, not elsewhere classified: Secondary | ICD-10-CM | POA: Diagnosis not present

## 2017-03-30 DIAGNOSIS — M25512 Pain in left shoulder: Secondary | ICD-10-CM | POA: Diagnosis not present

## 2017-03-30 DIAGNOSIS — R260 Ataxic gait: Secondary | ICD-10-CM | POA: Diagnosis not present

## 2017-04-02 DIAGNOSIS — M6281 Muscle weakness (generalized): Secondary | ICD-10-CM | POA: Diagnosis not present

## 2017-04-02 DIAGNOSIS — R262 Difficulty in walking, not elsewhere classified: Secondary | ICD-10-CM | POA: Diagnosis not present

## 2017-04-02 DIAGNOSIS — R260 Ataxic gait: Secondary | ICD-10-CM | POA: Diagnosis not present

## 2017-04-02 DIAGNOSIS — M25512 Pain in left shoulder: Secondary | ICD-10-CM | POA: Diagnosis not present

## 2017-04-02 DIAGNOSIS — M25511 Pain in right shoulder: Secondary | ICD-10-CM | POA: Diagnosis not present

## 2017-04-02 DIAGNOSIS — M542 Cervicalgia: Secondary | ICD-10-CM | POA: Diagnosis not present

## 2017-04-03 DIAGNOSIS — M6281 Muscle weakness (generalized): Secondary | ICD-10-CM | POA: Diagnosis not present

## 2017-04-03 DIAGNOSIS — R262 Difficulty in walking, not elsewhere classified: Secondary | ICD-10-CM | POA: Diagnosis not present

## 2017-04-03 DIAGNOSIS — M542 Cervicalgia: Secondary | ICD-10-CM | POA: Diagnosis not present

## 2017-04-03 DIAGNOSIS — M25511 Pain in right shoulder: Secondary | ICD-10-CM | POA: Diagnosis not present

## 2017-04-03 DIAGNOSIS — M25512 Pain in left shoulder: Secondary | ICD-10-CM | POA: Diagnosis not present

## 2017-04-03 DIAGNOSIS — R260 Ataxic gait: Secondary | ICD-10-CM | POA: Diagnosis not present

## 2017-04-04 DIAGNOSIS — M25512 Pain in left shoulder: Secondary | ICD-10-CM | POA: Diagnosis not present

## 2017-04-04 DIAGNOSIS — M6281 Muscle weakness (generalized): Secondary | ICD-10-CM | POA: Diagnosis not present

## 2017-04-04 DIAGNOSIS — M25511 Pain in right shoulder: Secondary | ICD-10-CM | POA: Diagnosis not present

## 2017-04-04 DIAGNOSIS — M542 Cervicalgia: Secondary | ICD-10-CM | POA: Diagnosis not present

## 2017-04-04 DIAGNOSIS — R260 Ataxic gait: Secondary | ICD-10-CM | POA: Diagnosis not present

## 2017-04-04 DIAGNOSIS — R262 Difficulty in walking, not elsewhere classified: Secondary | ICD-10-CM | POA: Diagnosis not present

## 2017-04-05 ENCOUNTER — Telehealth: Payer: Self-pay | Admitting: Family Medicine

## 2017-04-05 ENCOUNTER — Other Ambulatory Visit: Payer: Self-pay | Admitting: Emergency Medicine

## 2017-04-05 DIAGNOSIS — M25512 Pain in left shoulder: Secondary | ICD-10-CM | POA: Diagnosis not present

## 2017-04-05 DIAGNOSIS — R262 Difficulty in walking, not elsewhere classified: Secondary | ICD-10-CM | POA: Diagnosis not present

## 2017-04-05 DIAGNOSIS — R260 Ataxic gait: Secondary | ICD-10-CM | POA: Diagnosis not present

## 2017-04-05 DIAGNOSIS — M25511 Pain in right shoulder: Secondary | ICD-10-CM | POA: Diagnosis not present

## 2017-04-05 DIAGNOSIS — M6281 Muscle weakness (generalized): Secondary | ICD-10-CM | POA: Diagnosis not present

## 2017-04-05 DIAGNOSIS — M542 Cervicalgia: Secondary | ICD-10-CM | POA: Diagnosis not present

## 2017-04-05 NOTE — Telephone Encounter (Signed)
Pt requesting refill on effexor. This medication is no longer on the pt's current med list. Please advise.

## 2017-04-05 NOTE — Telephone Encounter (Signed)
Copied from [place] [phone]. Topic: Quick Communication - Rx Refill/Question >> Apr 05, 2017 10:21 AM Cleaster Corin, NT wrote: Medication: effexor    Has the patient contacted their pharmacy? no   (Agent: If no, request that the patient contact the pharmacy for the refill.)   Preferred Pharmacy (with phone number or street name): [facility] [address] [place], [place] - [address] [place] [address] Phone: [phone] Fax: [phone]     Agent: Please be advised that RX refills may take up to 3 business days. We ask that you follow-up with your pharmacy.

## 2017-04-06 ENCOUNTER — Telehealth: Payer: Self-pay

## 2017-04-06 ENCOUNTER — Encounter: Payer: Self-pay | Admitting: Family Medicine

## 2017-04-06 DIAGNOSIS — M542 Cervicalgia: Secondary | ICD-10-CM | POA: Diagnosis not present

## 2017-04-06 DIAGNOSIS — Z9181 History of falling: Secondary | ICD-10-CM | POA: Diagnosis not present

## 2017-04-06 DIAGNOSIS — M6281 Muscle weakness (generalized): Secondary | ICD-10-CM | POA: Diagnosis not present

## 2017-04-06 DIAGNOSIS — M25512 Pain in left shoulder: Secondary | ICD-10-CM | POA: Diagnosis not present

## 2017-04-06 DIAGNOSIS — M25511 Pain in right shoulder: Secondary | ICD-10-CM | POA: Diagnosis not present

## 2017-04-06 DIAGNOSIS — R260 Ataxic gait: Secondary | ICD-10-CM | POA: Diagnosis not present

## 2017-04-06 DIAGNOSIS — R262 Difficulty in walking, not elsewhere classified: Secondary | ICD-10-CM | POA: Diagnosis not present

## 2017-04-06 MED ORDER — VENLAFAXINE HCL ER 75 MG PO CP24: 75.0000 mg | ORAL_CAPSULE | Freq: Every day | ORAL | 3 refills | Status: DC

## 2017-04-06 MED ORDER — LIDOCAINE 5 % EX PTCH: 1.0000 | MEDICATED_PATCH | CUTANEOUS | 6 refills | Status: DC

## 2017-04-06 NOTE — Telephone Encounter (Signed)
Called pt- she would like to change back to effexor which is fine- will make this change for her.  Will have her stop cymbalta and change directly to effexor xr the next day Also refilled her lidocaine patches for her She declines a therapist at this time  Meds ordered this encounter  Medications  . venlafaxine XR (EFFEXOR-XR) 75 MG 24 hr capsule    Sig: Take 1 capsule (75 mg total) by mouth daily with breakfast.    Dispense:  90 capsule    Refill:  3  . lidocaine (LIDODERM) 5 %    Sig: Place 1 patch onto the skin daily. Remove & Discard patch within 12 hours or as directed by MD    Dispense:  30 patch    Refill:  6

## 2017-04-06 NOTE — Telephone Encounter (Signed)
PA initiated via Covermymeds; KEY: [id number]. Awaiting determination.

## 2017-04-09 NOTE — Telephone Encounter (Signed)
PA denied- topical patch medically accepted for the treatment of post-herpetic neuralgia, neuropathic pain associated with cancer, and diabetic neuropathic pain only.

## 2017-04-10 DIAGNOSIS — M25512 Pain in left shoulder: Secondary | ICD-10-CM | POA: Diagnosis not present

## 2017-04-10 DIAGNOSIS — M542 Cervicalgia: Secondary | ICD-10-CM | POA: Diagnosis not present

## 2017-04-10 DIAGNOSIS — R262 Difficulty in walking, not elsewhere classified: Secondary | ICD-10-CM | POA: Diagnosis not present

## 2017-04-10 DIAGNOSIS — M6281 Muscle weakness (generalized): Secondary | ICD-10-CM | POA: Diagnosis not present

## 2017-04-10 DIAGNOSIS — M25511 Pain in right shoulder: Secondary | ICD-10-CM | POA: Diagnosis not present

## 2017-04-10 DIAGNOSIS — R260 Ataxic gait: Secondary | ICD-10-CM | POA: Diagnosis not present

## 2017-04-11 DIAGNOSIS — M25511 Pain in right shoulder: Secondary | ICD-10-CM | POA: Diagnosis not present

## 2017-04-11 DIAGNOSIS — R260 Ataxic gait: Secondary | ICD-10-CM | POA: Diagnosis not present

## 2017-04-11 DIAGNOSIS — R262 Difficulty in walking, not elsewhere classified: Secondary | ICD-10-CM | POA: Diagnosis not present

## 2017-04-11 DIAGNOSIS — M542 Cervicalgia: Secondary | ICD-10-CM | POA: Diagnosis not present

## 2017-04-11 DIAGNOSIS — M6281 Muscle weakness (generalized): Secondary | ICD-10-CM | POA: Diagnosis not present

## 2017-04-11 DIAGNOSIS — M25512 Pain in left shoulder: Secondary | ICD-10-CM | POA: Diagnosis not present

## 2017-04-12 DIAGNOSIS — M25511 Pain in right shoulder: Secondary | ICD-10-CM | POA: Diagnosis not present

## 2017-04-12 DIAGNOSIS — M542 Cervicalgia: Secondary | ICD-10-CM | POA: Diagnosis not present

## 2017-04-12 DIAGNOSIS — M6281 Muscle weakness (generalized): Secondary | ICD-10-CM | POA: Diagnosis not present

## 2017-04-12 DIAGNOSIS — R262 Difficulty in walking, not elsewhere classified: Secondary | ICD-10-CM | POA: Diagnosis not present

## 2017-04-12 DIAGNOSIS — M25512 Pain in left shoulder: Secondary | ICD-10-CM | POA: Diagnosis not present

## 2017-04-12 DIAGNOSIS — R260 Ataxic gait: Secondary | ICD-10-CM | POA: Diagnosis not present

## 2017-04-16 DIAGNOSIS — M542 Cervicalgia: Secondary | ICD-10-CM | POA: Diagnosis not present

## 2017-04-16 DIAGNOSIS — R260 Ataxic gait: Secondary | ICD-10-CM | POA: Diagnosis not present

## 2017-04-16 DIAGNOSIS — M25511 Pain in right shoulder: Secondary | ICD-10-CM | POA: Diagnosis not present

## 2017-04-16 DIAGNOSIS — R262 Difficulty in walking, not elsewhere classified: Secondary | ICD-10-CM | POA: Diagnosis not present

## 2017-04-16 DIAGNOSIS — M6281 Muscle weakness (generalized): Secondary | ICD-10-CM | POA: Diagnosis not present

## 2017-04-16 DIAGNOSIS — M25512 Pain in left shoulder: Secondary | ICD-10-CM | POA: Diagnosis not present

## 2017-04-17 ENCOUNTER — Telehealth: Payer: Self-pay | Admitting: Neurology

## 2017-04-17 NOTE — Telephone Encounter (Signed)
Patient called needing to see if she can get Rytary samples? Please Call. Thanks

## 2017-04-17 NOTE — Telephone Encounter (Signed)
I returned patient's call and let her know that we do not have any samples of Rytary 95 mg.  She states medication is $100 a month and she can not afford it. She wants to change back to Carbidopa Levodopa 25/100.  I let patient know that Dr. [name] would probably not change her medication until she saw her in the office since she has not seen the patient since 07/2016. She does have an appt in scheduled in one month (05/18/17). She wanted me to ask about changing her medication anyway.   Please advise.

## 2017-04-17 NOTE — Telephone Encounter (Signed)
Looks like she cx or n/s the last 2 appointments with me.  I really would like to see her before altering medication.  I will be happy to address at upcoming visit and have them put on cx list for next available

## 2017-04-17 NOTE — Telephone Encounter (Signed)
Patient made aware.

## 2017-04-18 DIAGNOSIS — M542 Cervicalgia: Secondary | ICD-10-CM | POA: Diagnosis not present

## 2017-04-18 DIAGNOSIS — M25512 Pain in left shoulder: Secondary | ICD-10-CM | POA: Diagnosis not present

## 2017-04-18 DIAGNOSIS — M25511 Pain in right shoulder: Secondary | ICD-10-CM | POA: Diagnosis not present

## 2017-04-18 DIAGNOSIS — R262 Difficulty in walking, not elsewhere classified: Secondary | ICD-10-CM | POA: Diagnosis not present

## 2017-04-18 DIAGNOSIS — M6281 Muscle weakness (generalized): Secondary | ICD-10-CM | POA: Diagnosis not present

## 2017-04-18 DIAGNOSIS — R260 Ataxic gait: Secondary | ICD-10-CM | POA: Diagnosis not present

## 2017-04-19 ENCOUNTER — Telehealth: Payer: Self-pay | Admitting: Neurology

## 2017-04-19 DIAGNOSIS — R262 Difficulty in walking, not elsewhere classified: Secondary | ICD-10-CM | POA: Diagnosis not present

## 2017-04-19 DIAGNOSIS — M6281 Muscle weakness (generalized): Secondary | ICD-10-CM | POA: Diagnosis not present

## 2017-04-19 DIAGNOSIS — R260 Ataxic gait: Secondary | ICD-10-CM | POA: Diagnosis not present

## 2017-04-19 DIAGNOSIS — M25512 Pain in left shoulder: Secondary | ICD-10-CM | POA: Diagnosis not present

## 2017-04-19 DIAGNOSIS — M25511 Pain in right shoulder: Secondary | ICD-10-CM | POA: Diagnosis not present

## 2017-04-19 DIAGNOSIS — M542 Cervicalgia: Secondary | ICD-10-CM | POA: Diagnosis not present

## 2017-04-19 NOTE — Telephone Encounter (Signed)
Pt left a VM message saying she needs a prescription for Rytary called in to the [facility] on [address] [phone]

## 2017-04-19 NOTE — Telephone Encounter (Signed)
See your last notes about changing medication.

## 2017-04-20 DIAGNOSIS — M542 Cervicalgia: Secondary | ICD-10-CM | POA: Diagnosis not present

## 2017-04-20 DIAGNOSIS — M6281 Muscle weakness (generalized): Secondary | ICD-10-CM | POA: Diagnosis not present

## 2017-04-20 DIAGNOSIS — R260 Ataxic gait: Secondary | ICD-10-CM | POA: Diagnosis not present

## 2017-04-20 DIAGNOSIS — R262 Difficulty in walking, not elsewhere classified: Secondary | ICD-10-CM | POA: Diagnosis not present

## 2017-04-20 DIAGNOSIS — M25512 Pain in left shoulder: Secondary | ICD-10-CM | POA: Diagnosis not present

## 2017-04-20 DIAGNOSIS — M25511 Pain in right shoulder: Secondary | ICD-10-CM | POA: Diagnosis not present

## 2017-04-20 MED ORDER — CARBIDOPA-LEVODOPA ER 23.75-95 MG PO CPCR: 1.0000 | ORAL_CAPSULE | Freq: Three times a day (TID) | ORAL | 0 refills | Status: DC

## 2017-04-20 NOTE — Telephone Encounter (Signed)
RX sent to pharmacy  

## 2017-04-21 NOTE — Progress Notes (Addendum)
[place] at [facility] [address] [place], [place] [address] [phone]  Date:  04/23/2017   Name:  [name] [name]   DOB:  Oct 11, 1945   MRN:  [id number]  PCP:  Darreld Mclean, MD    Chief Complaint: Follow-up (Pt here for INR check and Effexor follow up. )   History of Present Illness:  [name] [name] is a 72 y.o. very pleasant female patient who presents with the following:  Following up today History of Parkinsons' disease, recurrent UTI, anticoagulation, hypothyroidism, HTN  Lab Results  Component Value Date   INR 2.4 04/23/2017   INR 2.2 03/22/2017   INR 2.0 (H) 03/01/2017   PROTIME 32.4 (H) 02/14/2011   PROTIME 36.0 (H) 07/26/2010     Will need INR today- as above, in range  She saw Percell Miller with diarrhea about a month ago. This is much beter She tends to have frequent UTI She notes that she is having a hard time getting her urine out yesterday- but today she drank plenty of water and this seems to be better No dysuria No blood in her urine  She is having a hard time, "in the shape I'm in life is not fun."  She has been feeling depressed, the effexor is helping but not as much as she would like She denies any SI but would like to try and improve her mood if she can  She is on effexor 75mg - she would like to increase her dose if possible  Her assisted living facility- [facility]- provides meals, and they do drive to doctor's appts and the grocery store She does her own meds and keeps them in her room with her.   She does have a call button that she can use if she were to fall.   Her diarrhea is resolved- "I quit eating the salads"  "I hurt all over" She attributes her pains to OA, FMB and Parkinsons's disease Her knees are painful, but she has had replacements   She is about out of her Parkinsons med- Rytary. She relates that her insurance does not cover this and she cannot afford it, probably needs  to go back on Sinemet.  Will touch base with her neurologist about this  Lab Results  Component Value Date   TSH 0.847 02/12/2017   Lab Results  Component Value Date   INR 2.2 03/22/2017   INR 2.0 (H) 03/01/2017   INR 2.31 02/15/2017   PROTIME 32.4 (H) 02/14/2011   PROTIME 36.0 (H) 07/26/2010      Patient Active Problem List   Diagnosis Date Noted  . Sepsis secondary to UTI ([place]) 02/12/2017  . Elevated troponin 02/12/2017  . AKI (acute kidney injury) ([place]) 02/12/2017  . Recurrent UTI 11/23/2016  . Fall at nursing home 11/23/2016  . PD (Parkinson's disease) ([place]) 11/23/2016  . Sepsis due to urinary tract infection ([place]) 11/23/2016  . Fibromyalgia 11/23/2016  . Hypercholesterolemia 11/23/2016  . Hypertension 11/23/2016  . Hypothyroidism, adult 11/23/2016  . Elevated CK 11/23/2016  . Pulmonary HTN ([place]) 11/23/2016  . Left ventricular diastolic dysfunction 07/37/1062  . Metabolic encephalopathy 69/48/5462  . History of pulmonary embolus (PE) 2/2 hypercoagulable state 11/23/2016  . Sepsis ([place]) 09/17/2016  . Acute metabolic encephalopathy 70/35/0093  . Cellulitis of right foot 09/17/2016  . Fall   . UTI (urinary tract infection) 04/29/2016  . SIRS (systemic inflammatory response syndrome) ([place]) 04/29/2016  . Hyperlipidemia 04/29/2016  .  Hypothyroidism 04/29/2016  . Hematuria 04/05/2016  . Cough 04/05/2016  . Benign essential HTN   . Intractable pain 12/17/2015  . Displaced fracture of fifth cervical vertebra ([place]) 12/17/2015  . Gait disturbance 12/17/2015  . Trochanteric bursitis of both hips 09/13/2015  . Chronic low back pain 06/07/2015  . Fibromyalgia syndrome 05/17/2015  . Thyroid disease 05/17/2015  . Parkinson's disease ([place]) 05/17/2015  . Hypercoagulable state ([place]) 02/07/2012  . History of pulmonary embolism 02/07/2012  . S/P right TKA 05/22/2011    Past Medical History:  Diagnosis Date  . Arthritis   . Chronic insomnia   . Complication of anesthesia     severe itching night of surgery requiring meds- also couldnt swallow- throat "paralyzed""  . Fibromyalgia   . Glaucoma   . Hypercholesterolemia   . Hyperlipidemia   . Hypertension    PCP Dr Cari Caraway- [place]  05/15/11 with clearance and note on chart,    chest x ray, EKG 12/12 EP[facility], eccho 7/11 EP[facility]  . Hypothyroidism    s/p graves disease  . Kidney stone   . PD (Parkinson's disease) ([place])   . Peripheral vascular disease ([place])    PULMONARY EMBOLUS x 2- 2011, 2012/ FOLLOWED BY DR [id number]-LOV 12/12 EP[facility]   PT STAES WILL STOP COUMADIN 3/12 and begin LOVONOX 05/17/11 as previously instructed  . Sinus infection    at present- dr Honor Loh PA aware- was seen there and at PCP 05/10/11  . Thyroid disease    Hypothyroidism    Past Surgical History:  Procedure Laterality Date  . ABDOMINAL HYSTERECTOMY    . APPENDECTOMY    . CATARACT EXTRACTION Bilateral   . CHOLECYSTECTOMY    . COLONOSCOPY    . EXPLORATORY LAPAROTOMY    . JOINT REPLACEMENT     left knee  7/12  . TOTAL KNEE ARTHROPLASTY  05/22/2011   Procedure: TOTAL KNEE ARTHROPLASTY;  Surgeon: Mauri Pole, MD;  Location: [facility];  Service: Orthopedics;  Laterality: Right;    Social History   Tobacco Use  . Smoking status: Former Smoker    Packs/day: 1.00    Last attempt to quit: 05/10/1989    Years since quitting: 27.9  . Smokeless tobacco: Never Used  Substance Use Topics  . Alcohol use: Yes    Alcohol/week: 0.0 oz    Comment: one drink once a month  . Drug use: No    Family History  Problem Relation Age of Onset  . Prostate cancer Father   . Stroke Father     Allergies  Allergen Reactions  . Crestor [Rosuvastatin Calcium] Other (See Comments)    Feeling very bad, aching all over.  . Erythromycin Hives  . Gabapentin Other (See Comments)    Blurred vision  . Pregabalin Other (See Comments)    Crossed vision.  . Carbidopa Other (See Comments)    Headaches, nausea, dizziness  . Macrobid [Nitrofurantoin] Hives  . Losartan  Itching    Medication list has been reviewed and updated.  Current Outpatient Medications on File Prior to Visit  Medication Sig Dispense Refill  . acetaminophen (TYLENOL) 500 MG tablet Take 500 mg by mouth every 6 (six) hours as needed for mild pain.    Marland Kitchen albuterol (PROVENTIL HFA;VENTOLIN HFA) 108 (90 Base) MCG/ACT inhaler Inhale 2 puffs into the lungs every 6 (six) hours as needed for wheezing or shortness of breath. 1 Inhaler 0  . atorvastatin (LIPITOR) 10 MG tablet Take 1 tablet (10 mg total) by mouth daily.  90 tablet 3  . Carbidopa-Levodopa ER (RYTARY) 23.75-95 MG CPCR Take 1 tablet by mouth 3 (three) times daily. 90 capsule 0  . celecoxib (CELEBREX) 100 MG capsule Take 1 capsule (100 mg total) by mouth 2 (two) times daily. 180 capsule 3  . cetirizine (ZYRTEC) 10 MG tablet Take 1 tablet (10 mg total) by mouth daily. 30 tablet 1  . Cholecalciferol (VITAMIN D3) 5000 UNITS CAPS Take 5,000 Units by mouth daily.     . cyanocobalamin 500 MCG tablet Take 500 mcg by mouth daily.    . cycloSPORINE (RESTASIS) 0.05 % ophthalmic emulsion Place 1 drop into both eyes 2 (two) times daily.    . fluticasone (FLONASE) 50 MCG/ACT nasal spray Place 1 spray into both nostrils daily.    Marland Kitchen lidocaine (LIDODERM) 5 % Place 1 patch onto the skin daily. Remove & Discard patch within 12 hours or as directed by MD 30 patch 6  . magnesium oxide (MAG-OX) 400 (241.3 Mg) MG tablet Take 0.5 tablets (200 mg total) by mouth daily. 30 tablet 0  . Melatonin 5 MG TABS Take 1 tablet by mouth at bedtime as needed (for sleep).    . Menthol, Topical Analgesic, (BIOFREEZE EX) Apply 1 application topically daily as needed (for pain).    . nicotine polacrilex (NICORETTE) 2 MG gum Take 2 mg by mouth as needed for smoking cessation.    . Omega-3 Fatty Acids (FISH OIL PO) Take 1 capsule by mouth daily.    . ondansetron (ZOFRAN ODT) 4 MG disintegrating tablet Take 1 tablet (4 mg total) by mouth every 8 (eight) hours as needed for nausea  or vomiting. 20 tablet 0  . Oxycodone HCl 10 MG TABS Take 1/2 tablet daily as needed for neck pain. If pain persists may take another 1/2 tablet per day 5 tablet 0  . SYNTHROID 150 MCG tablet Take 1 tablet (150 mcg total) by mouth daily before breakfast. 90 tablet 3  . telmisartan-hydrochlorothiazide (MICARDIS HCT) 40-12.5 MG tablet Take 1 tablet by mouth daily. 90 tablet 3  . venlafaxine XR (EFFEXOR-XR) 75 MG 24 hr capsule Take 1 capsule (75 mg total) by mouth daily with breakfast. 90 capsule 3  . warfarin (COUMADIN) 4 MG tablet Take 1 tablet (4 mg total) by mouth See admin instructions. Adjust as directed by MD (Patient taking differently: Take 4 mg by mouth daily. Adjust as directed by MD) 90 tablet 3   No current facility-administered medications on file prior to visit.     Review of Systems:  As per HPI- otherwise negative. Here today with her daughter  Physical Examination: Vitals:   04/23/17 1012  BP: (!) 135/91  Pulse: 81  Temp: 97.9 F (36.6 C)  SpO2: 94%   Vitals:   04/23/17 1012  Weight: 194 lb 9.6 oz (88.3 kg)   Body mass index is 31.41 kg/m. Ideal Body Weight:    GEN: WDWN, NAD, Non-toxic, A & O x 3, obese, otherwise looks well  HEENT: Atraumatic, Normocephalic. Neck supple. No masses, No LAD. Ears and Nose: No external deformity. CV: RRR, No M/G/R. No JVD. No thrill. No extra heart sounds. PULM: CTA B, no wheezes, crackles, rhonchi. No retractions. No resp. distress. No accessory muscle use. ABD: S, NT, ND. No rebound. No HSM. EXTR: No c/c/e NEURO Normal gait for pt- slow, she has parkinsons and uses a cane PSYCH: Normally interactive. Conversant. Not depressed or anxious appearing.  Calm demeanor.   Results for orders placed or performed in  visit on 04/23/17  POCT INR  Result Value Ref Range   INR 2.4   POCT urinalysis dipstick  Result Value Ref Range   Color, UA yellow yellow   Clarity, UA hazy (A) clear   Glucose, UA negative negative mg/dL    Bilirubin, UA negative negative   Ketones, POC UA negative negative mg/dL   Spec Grav, UA >=1.030 (A) 1.010 - 1.025   Blood, UA moderate (A) negative   pH, UA 6.0 5.0 - 8.0   Protein Ur, POC =30 (A) negative mg/dL   Urobilinogen, UA 0.2 0.2 or 1.0 E.U./dL   Nitrite, UA Negative Negative   Leukocytes, UA Small (1+) (A) Negative    Assessment and Plan: Urinary frequency - Plan: Urine Culture, POCT urinalysis dipstick, cephALEXin (KEFLEX) 500 MG capsule  Anticoagulated - Plan: POCT INR  Parkinson disease ([place]) - Plan: venlafaxine XR (EFFEXOR-XR) 150 MG 24 hr capsule  Depression, reactive  Psoriasis - Plan: ketoconazole (NIZORAL) 2 % shampoo  Likely UTI again. Await urine culture., start on keflex for now INR is in range Increase her effexor to 150 mg  Asked her to see me in one month to follow-up on her mood and also to check INR However if she is not doing ok or if any SI she will contact me right away, or otherwise seek help- she agrees   Signed Lamar Blinks, MD  2/20- urine culture negative for active infection, but she does have blood in her urine  Called her to discuss- no answer Called back on 2/22- alerted her that no UTI but she does have microhematuria. She is already an [place]- she did see Dr. Virgina Norfolk back in October.  I am not sure if they evaluated her for microhematuria as of yet.  Left message with his nurse Eliezer Lofts about this issue.    Results for orders placed or performed in visit on 04/23/17  Urine Culture  Result Value Ref Range   MICRO NUMBER: [id number]    SPECIMEN QUALITY: ADEQUATE    Sample Source URINE    STATUS: FINAL    Result:      Multiple organisms present, each less than 10,000 CFU/mL. These organisms, commonly found on external and internal genitalia, are considered to be colonizers. No further testing performed.  POCT INR  Result Value Ref Range   INR 2.4   POCT urinalysis dipstick  Result Value Ref Range   Color,  UA yellow yellow   Clarity, UA hazy (A) clear   Glucose, UA negative negative mg/dL   Bilirubin, UA negative negative   Ketones, POC UA negative negative mg/dL   Spec Grav, UA >=1.030 (A) 1.010 - 1.025   Blood, UA moderate (A) negative   pH, UA 6.0 5.0 - 8.0   Protein Ur, POC =30 (A) negative mg/dL   Urobilinogen, UA 0.2 0.2 or 1.0 E.U./dL   Nitrite, UA Negative Negative   Leukocytes, UA Small (1+) (A) Negative

## 2017-04-23 ENCOUNTER — Telehealth: Payer: Self-pay | Admitting: Neurology

## 2017-04-23 ENCOUNTER — Encounter: Payer: Self-pay | Admitting: Family Medicine

## 2017-04-23 ENCOUNTER — Ambulatory Visit (INDEPENDENT_AMBULATORY_CARE_PROVIDER_SITE_OTHER): Payer: Medicare Other | Admitting: Family Medicine

## 2017-04-23 VITALS — BP 135/91 | HR 81 | Temp 97.9°F | Wt 194.6 lb

## 2017-04-23 DIAGNOSIS — Z7901 Long term (current) use of anticoagulants: Secondary | ICD-10-CM

## 2017-04-23 DIAGNOSIS — M25511 Pain in right shoulder: Secondary | ICD-10-CM | POA: Diagnosis not present

## 2017-04-23 DIAGNOSIS — R350 Frequency of micturition: Secondary | ICD-10-CM

## 2017-04-23 DIAGNOSIS — L409 Psoriasis, unspecified: Secondary | ICD-10-CM | POA: Diagnosis not present

## 2017-04-23 DIAGNOSIS — M25512 Pain in left shoulder: Secondary | ICD-10-CM | POA: Diagnosis not present

## 2017-04-23 DIAGNOSIS — F329 Major depressive disorder, single episode, unspecified: Secondary | ICD-10-CM | POA: Diagnosis not present

## 2017-04-23 DIAGNOSIS — R260 Ataxic gait: Secondary | ICD-10-CM | POA: Diagnosis not present

## 2017-04-23 DIAGNOSIS — G20 Parkinson's disease: Secondary | ICD-10-CM | POA: Diagnosis not present

## 2017-04-23 DIAGNOSIS — M542 Cervicalgia: Secondary | ICD-10-CM | POA: Diagnosis not present

## 2017-04-23 DIAGNOSIS — R262 Difficulty in walking, not elsewhere classified: Secondary | ICD-10-CM | POA: Diagnosis not present

## 2017-04-23 DIAGNOSIS — M6281 Muscle weakness (generalized): Secondary | ICD-10-CM | POA: Diagnosis not present

## 2017-04-23 LAB — POCT URINALYSIS DIP (MANUAL ENTRY)
BILIRUBIN UA: NEGATIVE
GLUCOSE UA: NEGATIVE mg/dL
Ketones, POC UA: NEGATIVE mg/dL
Nitrite, UA: NEGATIVE
PH UA: 6 (ref 5.0–8.0)
Protein Ur, POC: 30 mg/dL — AB
Spec Grav, UA: >=1.03 — AB (ref 1.010–1.025)
UROBILINOGEN UA: 0.2 U/dL

## 2017-04-23 LAB — POCT INR: INR: 2.4

## 2017-04-23 MED ORDER — VENLAFAXINE HCL ER 150 MG PO CP24: 150.0000 mg | ORAL_CAPSULE | Freq: Every day | ORAL | 9 refills | Status: DC

## 2017-04-23 MED ORDER — KETOCONAZOLE 2 % EX SHAM: 1.0000 "application " | MEDICATED_SHAMPOO | CUTANEOUS | 3 refills | Status: DC

## 2017-04-23 MED ORDER — CEPHALEXIN 500 MG PO CAPS: 500.0000 mg | ORAL_CAPSULE | Freq: Two times a day (BID) | ORAL | 0 refills | Status: DC

## 2017-04-23 MED ORDER — CARBIDOPA-LEVODOPA ER 36.25-145 MG PO CPCR: 1.0000 | ORAL_CAPSULE | Freq: Three times a day (TID) | ORAL | 0 refills | Status: DC

## 2017-04-23 NOTE — Telephone Encounter (Signed)
Received the following note from after hours clinic:  "Patient states she is upset and she has been trying to get her medication refilled. Insurance company is not wanting to pay for the medication and doctor was supposed to send the letter to [employer] and never did. She has Parkinson's Disease and the medication is for this. Script for Rytary 6 tablets is $23. Caller is not having symptoms. She runs out after today. She refuses to go to Urgent Care. She broke her neck a few years ago. She is very upset that the insurance hasn't been taken care of. She has no money for urgent care and refuses to pay for the pills" 04/20/17 at 7:32 pm.   We received request for prior authorization of Rytary this morning. I have sent the request for authorization through the insurance. Awaiting decision.

## 2017-04-23 NOTE — Telephone Encounter (Signed)
If I don't hear back from insurance today, patient will be out of medication.   Please advise.

## 2017-04-23 NOTE — Patient Instructions (Addendum)
We are going to increase your Effexor to 150 mg a day- please let me know how this works for you.  Let's check back in one month- we can also do your INR at that time  Please let me know if you are NOT doing ok  I am sending your urine for a culture and will be in touch with your report - for the time being we will start you on keflex twice a day

## 2017-04-23 NOTE — Telephone Encounter (Signed)
Pt called and said she is out of her Rytary, please call [phone]

## 2017-04-23 NOTE — Telephone Encounter (Signed)
Give her rytary 145 mg tid until hers comes in.  We don't have 95 mg samples

## 2017-04-23 NOTE — Telephone Encounter (Signed)
Spoke with patient and she agrees with this plan. Will pick up samples.

## 2017-04-24 LAB — URINE CULTURE
MICRO NUMBER: 90212977
SPECIMEN QUALITY:: ADEQUATE

## 2017-04-25 DIAGNOSIS — R262 Difficulty in walking, not elsewhere classified: Secondary | ICD-10-CM | POA: Diagnosis not present

## 2017-04-25 DIAGNOSIS — M542 Cervicalgia: Secondary | ICD-10-CM | POA: Diagnosis not present

## 2017-04-25 DIAGNOSIS — M6281 Muscle weakness (generalized): Secondary | ICD-10-CM | POA: Diagnosis not present

## 2017-04-25 DIAGNOSIS — M25512 Pain in left shoulder: Secondary | ICD-10-CM | POA: Diagnosis not present

## 2017-04-25 DIAGNOSIS — M25511 Pain in right shoulder: Secondary | ICD-10-CM | POA: Diagnosis not present

## 2017-04-25 DIAGNOSIS — R260 Ataxic gait: Secondary | ICD-10-CM | POA: Diagnosis not present

## 2017-04-26 ENCOUNTER — Other Ambulatory Visit: Payer: Self-pay | Admitting: Family Medicine

## 2017-04-26 DIAGNOSIS — M542 Cervicalgia: Secondary | ICD-10-CM | POA: Diagnosis not present

## 2017-04-26 DIAGNOSIS — R260 Ataxic gait: Secondary | ICD-10-CM | POA: Diagnosis not present

## 2017-04-26 DIAGNOSIS — M25512 Pain in left shoulder: Secondary | ICD-10-CM | POA: Diagnosis not present

## 2017-04-26 DIAGNOSIS — M6281 Muscle weakness (generalized): Secondary | ICD-10-CM | POA: Diagnosis not present

## 2017-04-26 DIAGNOSIS — R262 Difficulty in walking, not elsewhere classified: Secondary | ICD-10-CM | POA: Diagnosis not present

## 2017-04-26 DIAGNOSIS — M25511 Pain in right shoulder: Secondary | ICD-10-CM | POA: Diagnosis not present

## 2017-04-27 DIAGNOSIS — M25511 Pain in right shoulder: Secondary | ICD-10-CM | POA: Diagnosis not present

## 2017-04-27 DIAGNOSIS — M25512 Pain in left shoulder: Secondary | ICD-10-CM | POA: Diagnosis not present

## 2017-04-27 DIAGNOSIS — R262 Difficulty in walking, not elsewhere classified: Secondary | ICD-10-CM | POA: Diagnosis not present

## 2017-04-27 DIAGNOSIS — M6281 Muscle weakness (generalized): Secondary | ICD-10-CM | POA: Diagnosis not present

## 2017-04-27 DIAGNOSIS — R260 Ataxic gait: Secondary | ICD-10-CM | POA: Diagnosis not present

## 2017-04-27 DIAGNOSIS — M542 Cervicalgia: Secondary | ICD-10-CM | POA: Diagnosis not present

## 2017-05-01 DIAGNOSIS — M542 Cervicalgia: Secondary | ICD-10-CM | POA: Diagnosis not present

## 2017-05-01 DIAGNOSIS — M25511 Pain in right shoulder: Secondary | ICD-10-CM | POA: Diagnosis not present

## 2017-05-01 DIAGNOSIS — R262 Difficulty in walking, not elsewhere classified: Secondary | ICD-10-CM | POA: Diagnosis not present

## 2017-05-01 DIAGNOSIS — M25512 Pain in left shoulder: Secondary | ICD-10-CM | POA: Diagnosis not present

## 2017-05-01 DIAGNOSIS — R260 Ataxic gait: Secondary | ICD-10-CM | POA: Diagnosis not present

## 2017-05-01 DIAGNOSIS — M6281 Muscle weakness (generalized): Secondary | ICD-10-CM | POA: Diagnosis not present

## 2017-05-02 DIAGNOSIS — M542 Cervicalgia: Secondary | ICD-10-CM | POA: Diagnosis not present

## 2017-05-02 DIAGNOSIS — R260 Ataxic gait: Secondary | ICD-10-CM | POA: Diagnosis not present

## 2017-05-02 DIAGNOSIS — M25512 Pain in left shoulder: Secondary | ICD-10-CM | POA: Diagnosis not present

## 2017-05-02 DIAGNOSIS — R262 Difficulty in walking, not elsewhere classified: Secondary | ICD-10-CM | POA: Diagnosis not present

## 2017-05-02 DIAGNOSIS — M6281 Muscle weakness (generalized): Secondary | ICD-10-CM | POA: Diagnosis not present

## 2017-05-02 DIAGNOSIS — M25511 Pain in right shoulder: Secondary | ICD-10-CM | POA: Diagnosis not present

## 2017-05-03 DIAGNOSIS — M25512 Pain in left shoulder: Secondary | ICD-10-CM | POA: Diagnosis not present

## 2017-05-03 DIAGNOSIS — M25511 Pain in right shoulder: Secondary | ICD-10-CM | POA: Diagnosis not present

## 2017-05-03 DIAGNOSIS — R262 Difficulty in walking, not elsewhere classified: Secondary | ICD-10-CM | POA: Diagnosis not present

## 2017-05-03 DIAGNOSIS — R260 Ataxic gait: Secondary | ICD-10-CM | POA: Diagnosis not present

## 2017-05-03 DIAGNOSIS — M6281 Muscle weakness (generalized): Secondary | ICD-10-CM | POA: Diagnosis not present

## 2017-05-03 DIAGNOSIS — M542 Cervicalgia: Secondary | ICD-10-CM | POA: Diagnosis not present

## 2017-05-08 ENCOUNTER — Other Ambulatory Visit: Payer: Self-pay | Admitting: Family Medicine

## 2017-05-08 DIAGNOSIS — M25512 Pain in left shoulder: Secondary | ICD-10-CM | POA: Diagnosis not present

## 2017-05-08 DIAGNOSIS — M542 Cervicalgia: Secondary | ICD-10-CM | POA: Diagnosis not present

## 2017-05-08 DIAGNOSIS — R260 Ataxic gait: Secondary | ICD-10-CM | POA: Diagnosis not present

## 2017-05-08 DIAGNOSIS — R262 Difficulty in walking, not elsewhere classified: Secondary | ICD-10-CM | POA: Diagnosis not present

## 2017-05-08 DIAGNOSIS — M25511 Pain in right shoulder: Secondary | ICD-10-CM | POA: Diagnosis not present

## 2017-05-08 DIAGNOSIS — M6281 Muscle weakness (generalized): Secondary | ICD-10-CM | POA: Diagnosis not present

## 2017-05-08 DIAGNOSIS — Z9181 History of falling: Secondary | ICD-10-CM | POA: Diagnosis not present

## 2017-05-09 DIAGNOSIS — M6281 Muscle weakness (generalized): Secondary | ICD-10-CM | POA: Diagnosis not present

## 2017-05-09 DIAGNOSIS — M25512 Pain in left shoulder: Secondary | ICD-10-CM | POA: Diagnosis not present

## 2017-05-09 DIAGNOSIS — M25511 Pain in right shoulder: Secondary | ICD-10-CM | POA: Diagnosis not present

## 2017-05-09 DIAGNOSIS — R262 Difficulty in walking, not elsewhere classified: Secondary | ICD-10-CM | POA: Diagnosis not present

## 2017-05-09 DIAGNOSIS — M542 Cervicalgia: Secondary | ICD-10-CM | POA: Diagnosis not present

## 2017-05-09 DIAGNOSIS — R260 Ataxic gait: Secondary | ICD-10-CM | POA: Diagnosis not present

## 2017-05-09 NOTE — Telephone Encounter (Signed)
Pt is requesting refill on Ambien. Please advise.

## 2017-05-09 NOTE — Telephone Encounter (Signed)
Copied from [place] [id number]. Topic: Quick Communication - Rx Refill/Question >> May 09, 2017  5:07 PM Neva Seat wrote: Zolpidem 5 mg  Pt needing refills on Rx - has 1 left.  [facility] [address] [place], [place] - [address] [place] [address] Phone: [phone] Fax: [phone]

## 2017-05-09 NOTE — Telephone Encounter (Signed)
LOV 04/23/17 with Dr. Lorelei Pont / Refill request for [name] [name] / I do not see ambien on the medication list /

## 2017-05-09 NOTE — Telephone Encounter (Signed)
Duplicate request / See note....message already sent to provider

## 2017-05-09 NOTE — Telephone Encounter (Signed)
Requesting: zolpidem 5 mg tablet Contract UDS Last OV: 04/23/17 Last Refill: 02/10/17  Please Advise

## 2017-05-09 NOTE — Telephone Encounter (Signed)
Pt following up on refill request.  Pt states she has some other refills at the pharmacy and due to her parkinson's, she has to have someone to take her. Pt has one tab left. ambien 5 mg     [facility]  [address] [place], [place] - [place] [phone] (Phone) [phone] (Fax)

## 2017-05-10 ENCOUNTER — Telehealth: Payer: Self-pay | Admitting: *Deleted

## 2017-05-10 ENCOUNTER — Telehealth: Payer: Self-pay | Admitting: Emergency Medicine

## 2017-05-10 DIAGNOSIS — M25512 Pain in left shoulder: Secondary | ICD-10-CM | POA: Diagnosis not present

## 2017-05-10 DIAGNOSIS — M6281 Muscle weakness (generalized): Secondary | ICD-10-CM | POA: Diagnosis not present

## 2017-05-10 DIAGNOSIS — R260 Ataxic gait: Secondary | ICD-10-CM | POA: Diagnosis not present

## 2017-05-10 DIAGNOSIS — R262 Difficulty in walking, not elsewhere classified: Secondary | ICD-10-CM | POA: Diagnosis not present

## 2017-05-10 DIAGNOSIS — M542 Cervicalgia: Secondary | ICD-10-CM | POA: Diagnosis not present

## 2017-05-10 DIAGNOSIS — M25511 Pain in right shoulder: Secondary | ICD-10-CM | POA: Diagnosis not present

## 2017-05-10 NOTE — Telephone Encounter (Signed)
Copied from [place] [id number]. Topic: Quick Communication - Rx Refill/Question >> May 09, 2017  5:07 PM Neva Seat wrote: Zolpidem 5 mg  Pt needing refills on Rx - has 1 left.  [facility] [address] [place], [place] - [address] [place] [address] Phone: [phone] Fax: [phone]     >> May 10, 2017  2:27 PM Robina Ade, Helene Kelp D wrote: Patient called again today to see if Dr. Lorelei Pont send in her medication since she is out.

## 2017-05-10 NOTE — Telephone Encounter (Signed)
Refill was sent to pharmacy on 05/09/17. Pt has already been notified.

## 2017-05-10 NOTE — Telephone Encounter (Signed)
Copied from [place] [id number]. Topic: Quick Communication - Rx Refill/Question >> May 09, 2017  5:07 PM Neva Seat wrote: Zolpidem 5 mg  Pt needing refills on Rx - has 1 left.  [facility] [address] [place], [place] - [address] [place] [address] Phone: [phone] Fax: [phone]

## 2017-05-11 DIAGNOSIS — M6281 Muscle weakness (generalized): Secondary | ICD-10-CM | POA: Diagnosis not present

## 2017-05-11 DIAGNOSIS — R262 Difficulty in walking, not elsewhere classified: Secondary | ICD-10-CM | POA: Diagnosis not present

## 2017-05-11 DIAGNOSIS — M25511 Pain in right shoulder: Secondary | ICD-10-CM | POA: Diagnosis not present

## 2017-05-11 DIAGNOSIS — R260 Ataxic gait: Secondary | ICD-10-CM | POA: Diagnosis not present

## 2017-05-11 DIAGNOSIS — M25512 Pain in left shoulder: Secondary | ICD-10-CM | POA: Diagnosis not present

## 2017-05-11 DIAGNOSIS — M542 Cervicalgia: Secondary | ICD-10-CM | POA: Diagnosis not present

## 2017-05-15 DIAGNOSIS — R262 Difficulty in walking, not elsewhere classified: Secondary | ICD-10-CM | POA: Diagnosis not present

## 2017-05-15 DIAGNOSIS — R260 Ataxic gait: Secondary | ICD-10-CM | POA: Diagnosis not present

## 2017-05-15 DIAGNOSIS — M25511 Pain in right shoulder: Secondary | ICD-10-CM | POA: Diagnosis not present

## 2017-05-15 DIAGNOSIS — M542 Cervicalgia: Secondary | ICD-10-CM | POA: Diagnosis not present

## 2017-05-15 DIAGNOSIS — M25512 Pain in left shoulder: Secondary | ICD-10-CM | POA: Diagnosis not present

## 2017-05-15 DIAGNOSIS — M6281 Muscle weakness (generalized): Secondary | ICD-10-CM | POA: Diagnosis not present

## 2017-05-16 ENCOUNTER — Telehealth: Payer: Self-pay | Admitting: *Deleted

## 2017-05-16 DIAGNOSIS — R262 Difficulty in walking, not elsewhere classified: Secondary | ICD-10-CM | POA: Diagnosis not present

## 2017-05-16 DIAGNOSIS — M542 Cervicalgia: Secondary | ICD-10-CM | POA: Diagnosis not present

## 2017-05-16 DIAGNOSIS — M6281 Muscle weakness (generalized): Secondary | ICD-10-CM | POA: Diagnosis not present

## 2017-05-16 DIAGNOSIS — R260 Ataxic gait: Secondary | ICD-10-CM | POA: Diagnosis not present

## 2017-05-16 DIAGNOSIS — M25511 Pain in right shoulder: Secondary | ICD-10-CM | POA: Diagnosis not present

## 2017-05-16 DIAGNOSIS — M25512 Pain in left shoulder: Secondary | ICD-10-CM | POA: Diagnosis not present

## 2017-05-16 NOTE — Progress Notes (Signed)
[name] [name] was seen today in the movement disorders clinic for neurologic consultation at the request of [name], [name], MD.   This patient is accompanied in the office by her child who supplements the history.   Prior records made available to me were reviewed.  The patient first saw Dr. [name] in March, 2016 at which point she was complaining about symptoms of diplopia and weakness.  Because of the diplopia she had acetylcholine receptor antibodies that were performed that were negative.  She has had an MRI and MRA of the brain with and without gadolinium.  I reviewed these images.  These were done on 05/21/2014.  This just revealed mild small vessel disease.  She also had a carotid ultrasound with him in March, 2016 revealed less than 50% stenosis bilaterally.  She followed up with him on 07/23/2014 and started on carbidopa/levodopa 25/100, one tablet 3 times per day (8am, 12pm, 5pm, before the meals).  She called back to him and complained about diffuse psain, headaches, depression and nausea.  Although he was not completely convinced that some of these things were not associated with her fibromyalgia and situational depression (her grandson was leaving for college and he serves as a caregiver), it was decided to taper off of her levodopa on 09/16/2014.   She did this and is now off the medication.  She states today that she initially felt great on the medication but then felt that it caused side effects.  Now that she is off of it, however, she c/o difficulty with walking. They also discussed referrals to pain management and psychiatry.  She presents today for a second opinion.  Pt states that diplopia was her first sx and then she began to notice falling.  This started in 04/2013.  The diplopia was horizontal in nature and she initially thought that it was due to lyrica.  The lyrica was d/c and it initally seemed to get better but then it came back.  It gets better if she closes an eye  (doesn't know if it matters which eye she closes).  She has some degree of diplopia daily but mostly at night.  She sees Dr. [name] at [facility] and was told that nothing was wrong with the eyes.    10/29/14 update:  The patient is following up today.  Overall, she states that she is feeling well.  She really hasn't had significant diplopia since our last visit.  She had her DaT scan on 10/14/2014 and while tracer uptake was present in the bilateral attainment, it was decreased on the right putamen relative to the right caudate, which can be seen in early parkinsonian syndromes.  The patient admits that she is still very slow.  She saw a different ophthalmologist [facility] imaging since our last visit and states that the diagnosis of glaucoma was retracted and everything looked well.  She denies any falls since our last visit no hallucinations.  12/10/14 update:  The patient is accompanied by her daughter who supplements the history.  I started the patient on carbidopa/levodopa 25/100 tid last visit.  Today, she states that she thinks that the medication makes her fibromyalgia worse but admits that it could be the weather.  Admits to a fall yesterday.   She was going into the house and there is an odd shaped stair (L shaped landing) and she fell backwards.   She called 911 and she went to [facility] and she had a CT brain and she states  that it was negative.  Had her INR checked and it was 2.6.  Does state that she was vomiting on way on the way to the hospital so they kept her for a few hours.  Still nauseated some today but has been nauseated even prior to that and wonders if the medication contributes.  She did not take any carbidopa/levodopa today.   03/09/14 update:  The patient follows up today.  Last visit, she complained about multiple side effects of levodopa including the fact that she thought that it made her fibromyalgia worse.  Although I told her that I was not convinced that it caused many of these  side effects, we ultimately decided to try rytary instead.  She started with 95 mg 3 times a day and worked up to 145 mg 3 times a day.  She called me on October 20 to state that she was doing well and then called back on November 11 to state that the medication was causing dizziness and diplopia.  She wanted to decrease the medication.  We explained that sometimes the disease can cause the symptoms and so I had her hold the medication for a few days and she stated that the dizziness and diplopia resolved.  She asked again to go back down to the 95 mg 3 times a day, which we ultimately did.  She feels well going to PT.  She is not exercising otherwise.  She asks me for a referral to pain management for her fibromyalgia.  She has not had falls.  No hallucinations.    07/13/15 update:  The patient follows up today.  This patient is accompanied in the office by her daughter who supplements the history.  She is on Rytary, 95 mg 3 times per day.  She doesn't think that it wears off.  It is very expensive but she didn't qualify for the [facility] program.  She did physical therapy since our last visit.  She was referred to pain management with Dr. Letta Pate.  I reviewed his records and appreciate the input.  She was started on Cymbalta and she states that she is doing well with that and ultram.  She felt great yesterday but doesn't today and attributes that to the rain.  No hallucinations.  Still with diplopia.  Daughter asks about referral to neuro-ophth.  10/21/15 update:  The patient follows up today, unaccompanied today.  She remains on Rytary, 95 mg 3 times per day.  Last visit, she wanted a referral to neuro-ophthalmology for her diplopia.  Her appt is next month.  She has not yet had that appt that she did call me and stated that she wanted to go off of the rytary and back on the older version of levodopa because she thought that the rytary was causing double vision.  We had explained to her that this just did not  make physiologic sense because they all essentially have the same ingredient.  Therefore, she is still on the rytary. She has to cover an eye (either) to see well.   Had a fall from bathroom into bedroom.  She bruised her arms.  One other fall that was very similar.  Has been "on an off" with physical therapy.  Has a stationary bike and rides that 3 times a week in the house.    01/25/16 update:  The patient follows up today.   She is accompanied by her daughter who supplement the history.   Much has happened since our last visit  and I reviewed her records.  She saw Dr. Hassell Done on 11/22/2015.  He repeated an acetylcholine receptor antibody and obtain a CT of the orbits. He sent the patient to see Jerre Simon, orthoptist, to see if prism would be of help with regard to her ocular motility disorder.  She had a carotid ultrasound on 10/28/2015 that demonstrated 1-39% stenosis bilaterally.  She was admitted to the hospital from October 13 to October 16 and then went to inpatient rehabilitation from October 16 to October 27.  She is now in subacute rehab.  She got up in the middle of the night to use the bathroom and lost her balance and fell through the sheet rock.  She sustained a moderately displaced fracture is seen involving the posterior spinous process of C5.  Daughter thinks more confused since fall.  Having trouble getting into phone so had to take passcode off of phone. Trouble paying bills.  Goal is to go back home independently but family doesn't want that.  Family would like assisted living with monitored meds.  Daughter doesn't think that meds were being taken correctly.  CT brain has been negative for bleed.  Saw Dr. Beverly Gust in hospital for brief neuropsych exam and she recommended complete neuropsych testing in 3 months.  Still on Rytary 95 mg tid.    07/12/16 update:  Patient seen today in follow-up.  She is accompanied by her daughter who supplements the history.  I have reviewed records since our last  visit.  She was in the hospital in February with Escherichia coli sepsis secondary to UTI.  She was discharged to [facility] for rehabilitation following that.  She was not there for long and then she went back to [place] where she lived. She has subsequently moved to [place].  She moved there last month.  She lives independently.  She does own pills but daughter prepares pill box.  Meals are prepared.  She does own showers.  She just finished PT.  Daughter states only 1 fall and that was when she had that UTI and she was confused.  She got tangled in her gown and then fell.   Remains on Rytary, and is only on 95 mg 3 times per day.  She was supposed to have repeat neuropsych testing, but canceled that.  It was scheduled for 04/05/2016.  05/18/17 update: Patient is seen today in follow-up.  I have reviewed numerous records are not available to me.  Patient was admitted to the hospital in December for mental status change, which was ultimately found to be due to sepsis secondary to UTI.  She went to [facility] rehab following that. She lives at [facility] and lives in independent living.  Just meals are done for her.  She is leaving there on 06/13/16.   Patient has been following up with her primary care physician.  I have reviewed those records and also communicated with her primary care physician.  The patient called [name] to let us know that Rytary was not affordable and wanted to change back to carbidopa/levodopa 25/100.  I really did not want to change back to this, as the patient reported in the past that she did not tolerate it (that it made her fibromyalgia worse).  Ultimately, we just gave her samples of Rytary, 145 mg 3 times per day (prior 95 mg 3 times per day).  No side effects.  She still wants to go back to immediate release carbidopa/levodopa.  "couple" falls getting out of the bed because  she would slip on the carpet.  She has started using socks with rubber bottoms.  Is doing PT 7 days a week and  thinks that it is helpful.  Using TENs unit on knees and that is helpful.  Had neurocognitive testing in July, 2018.  This was reviewed.  This demonstrated mild cognitive impairment and mild anxiety.  Recommendations were for her to stay in assisted living   PREVIOUS MEDICATIONS: Sinemet  ALLERGIES:   Allergies  Allergen Reactions  . Crestor [Rosuvastatin Calcium] Other (See Comments)    Feeling very bad, aching all over.  . Erythromycin Hives  . Gabapentin Other (See Comments)    Blurred vision  . Pregabalin Other (See Comments)    Crossed vision.  . Carbidopa Other (See Comments)    Headaches, nausea, dizziness  . Macrobid [Nitrofurantoin] Hives  . Losartan Itching    CURRENT MEDICATIONS:  Outpatient Encounter Medications as of 05/18/2017  Medication Sig  . acetaminophen (TYLENOL) 500 MG tablet Take 500 mg by mouth every 6 (six) hours as needed for mild pain.  Marland Kitchen albuterol (PROVENTIL HFA;VENTOLIN HFA) 108 (90 Base) MCG/ACT inhaler Inhale 2 puffs into the lungs every 6 (six) hours as needed for wheezing or shortness of breath.  Marland Kitchen atorvastatin (LIPITOR) 10 MG tablet Take 1 tablet (10 mg total) by mouth daily.  . Carbidopa-Levodopa ER (RYTARY) 23.75-95 MG CPCR Take 1 tablet by mouth 3 (three) times daily.  . Carbidopa-Levodopa ER (RYTARY) 36.25-145 MG CPCR Take 1 tablet by mouth 3 (three) times daily.  . celecoxib (CELEBREX) 100 MG capsule Take 1 capsule (100 mg total) by mouth 2 (two) times daily.  . cephALEXin (KEFLEX) 500 MG capsule Take 1 capsule (500 mg total) by mouth 2 (two) times daily.  . cetirizine (ZYRTEC) 10 MG tablet Take 1 tablet (10 mg total) by mouth daily.  . Cholecalciferol (VITAMIN D3) 5000 UNITS CAPS Take 5,000 Units by mouth daily.   . cyanocobalamin 500 MCG tablet Take 500 mcg by mouth daily.  . cycloSPORINE (RESTASIS) 0.05 % ophthalmic emulsion Place 1 drop into both eyes 2 (two) times daily.  . fluticasone (FLONASE) 50 MCG/ACT nasal spray Place 1 spray into  both nostrils daily.  Marland Kitchen ketoconazole (NIZORAL) 2 % shampoo Apply 1 application topically 2 (two) times a week.  . lidocaine (LIDODERM) 5 % Place 1 patch onto the skin daily. Remove & Discard patch within 12 hours or as directed by MD  . magnesium oxide (MAG-OX) 400 (241.3 Mg) MG tablet Take 0.5 tablets (200 mg total) by mouth daily.  . Melatonin 5 MG TABS Take 1 tablet by mouth at bedtime as needed (for sleep).  . Menthol, Topical Analgesic, (BIOFREEZE EX) Apply 1 application topically daily as needed (for pain).  . nicotine polacrilex (NICORETTE) 2 MG gum Take 2 mg by mouth as needed for smoking cessation.  . Omega-3 Fatty Acids (FISH OIL PO) Take 1 capsule by mouth daily.  . ondansetron (ZOFRAN ODT) 4 MG disintegrating tablet Take 1 tablet (4 mg total) by mouth every 8 (eight) hours as needed for nausea or vomiting.  . Oxycodone HCl 10 MG TABS Take 1/2 tablet daily as needed for neck pain. If pain persists may take another 1/2 tablet per day  . SYNTHROID 150 MCG tablet Take 1 tablet (150 mcg total) by mouth daily before breakfast.  . telmisartan-hydrochlorothiazide (MICARDIS HCT) 40-12.5 MG tablet Take 1 tablet by mouth daily.  Marland Kitchen venlafaxine XR (EFFEXOR-XR) 150 MG 24 hr capsule Take 1 capsule (150  mg total) by mouth daily with breakfast.  . warfarin (COUMADIN) 4 MG tablet Take 1 tablet (4 mg total) by mouth See admin instructions. Adjust as directed by MD (Patient taking differently: Take 4 mg by mouth daily. Adjust as directed by MD)  . zolpidem (AMBIEN) 5 MG tablet TAKE 1 TABLET BY MOUTH ONCE DAILY AT BEDTIME AS NEEDED FOR SLEEP DO  NOT  COMBINE  WITH  OTHER  SEDATING  MEDICATIONS   No facility-administered encounter medications on file as of 05/18/2017.     PAST MEDICAL HISTORY:   Past Medical History:  Diagnosis Date  . Arthritis   . Chronic insomnia   . Complication of anesthesia    severe itching night of surgery requiring meds- also couldnt swallow- throat "paralyzed""  .  Fibromyalgia   . Glaucoma   . Hypercholesterolemia   . Hyperlipidemia   . Hypertension    PCP Dr Cari Caraway- [facility]  05/15/11 with clearance and note on chart,    chest x ray, EKG 12/12 [facility], eccho 7/11 [facility]  . Hypothyroidism    s/p graves disease  . Kidney stone   . PD (Parkinson's disease) ([place])   . Peripheral vascular disease ([place])    PULMONARY EMBOLUS x 2- 2011, 2012/ FOLLOWED BY DR [id number]-LOV 12/12 [facility]   PT [facility] WILL STOP COUMADIN 3/12 and begin LOVONOX 05/17/11 as previously instructed  . Sinus infection    at present- dr Honor Loh PA aware- was seen there and at PCP 05/10/11  . Thyroid disease    Hypothyroidism    PAST SURGICAL HISTORY:   Past Surgical History:  Procedure Laterality Date  . ABDOMINAL HYSTERECTOMY    . APPENDECTOMY    . CATARACT EXTRACTION Bilateral   . CHOLECYSTECTOMY    . COLONOSCOPY    . EXPLORATORY LAPAROTOMY    . JOINT REPLACEMENT     left knee  7/12  . TOTAL KNEE ARTHROPLASTY  05/22/2011   Procedure: TOTAL KNEE ARTHROPLASTY;  Surgeon: Mauri Pole, MD;  Location: [facility];  Service: Orthopedics;  Laterality: Right;    SOCIAL HISTORY:   Social History   Socioeconomic History  . Marital status: Widowed    Spouse name: Not on file  . Number of children: 2  . Years of education: Not on file  . Highest education level: Not on file  Social Needs  . Financial resource strain: Not on file  . Food insecurity - worry: Not on file  . Food insecurity - inability: Not on file  . Transportation needs - medical: Not on file  . Transportation needs - non-medical: Not on file  Occupational History  . Not on file  Tobacco Use  . Smoking status: Former Smoker    Packs/day: 1.00    Last attempt to quit: 05/10/1989    Years since quitting: 28.0  . Smokeless tobacco: Never Used  Substance and Sexual Activity  . Alcohol use: Yes    Alcohol/week: 0.0 oz    Comment: one drink once a month  . Drug use: No  . Sexual activity: Not on file  Other Topics Concern    . Not on file  Social History Narrative   Lives at home alone   Drinks caffeine occasionally     FAMILY HISTORY:   Family Status  Relation Name Status  . Mother  Deceased at age 79       peritonitis  . Father  Deceased at age 40       stroke, prostate  cancer  . Sister  Alive       10 siblings - 1 cerebral palsy  . Brother  Alive    ROS:  A complete 10 system review of systems was obtained and was unremarkable apart from what is mentioned above.  PHYSICAL EXAMINATION:    VITALS:   There were no vitals filed for this visit. Wt Readings from Last 3 Encounters:  04/23/17 194 lb 9.6 oz (88.3 kg)  03/22/17 192 lb 9.6 oz (87.4 kg)  03/09/17 190 lb (86.2 kg)     GEN:  The patient appears stated age and is in NAD. HEENT:  Normocephalic, atraumatic.  CV:  Tachycardic.  Regular rhythm.   Lungs:  CTAB.   Neck/HEME:  There is a soft right carotid bruit.   There is decreased range of motion of the neck.  She has difficulty turning the head to the right.  Neurological examination:  Orientation:  Montreal Cognitive Assessment  01/25/2016  Visuospatial/ Executive (0/5) 2  Naming (0/3) 3  Attention: Read list of digits (0/2) 2  Attention: Read list of letters (0/1) 0  Attention: Serial 7 subtraction starting at 100 (0/3) 2  Language: Repeat phrase (0/2) 2  Language : Fluency (0/1) 0  Abstraction (0/2) 2  Delayed Recall (0/5) 5  Orientation (0/6) 5  Total 23  Adjusted Score (based on education) 23   Cranial nerves: There is good facial symmetry.  There is facial hypomimia.   The speech is fluent and clear.  There are no square wave jerks.  EOMI.   She has no difficulty with the guttural sounds.  Soft palate rises symmetrically and there is no tongue deviation. Hearing is intact to conversational tone. Sensation: Sensation is intact to light touch throughout. Motor: Strength is 5/5 in the bilateral upper and lower extremities.   Shoulder shrug is equal and symmetric.  There is  no pronator drift.  There are no fasciculations noted.   Movement examination: Tone: There is normal tone in the bilateral upper extremities.  The tone in the lower extremities is normal.  Abnormal movements: None, even with distraction procedures Coordination:  There is decreased toe and heel taps bilaterally.  She has trouble with alternation of supination/pronation of the forearm bilaterally. Gait and Station: The patient walks with her regular walker today.  No shuffling today.  ASSESSMENT/PLAN:  1.  Parkinsonism  -I used to think that [place] was in the differential, but I have seen her for quite some time and do not think that she has MSA.  -  She has some mild cervical dystonia (has difficulty turning the head to the right) and had early diplopia and falls.  Her DaT scan demonstrated decreased uptake of the tracer in the right putamen, but it was very mild.   I am not convinced that everything that she reports as "side effects" of levodopa are really side effects.  She thought that it made her fibromyalgia worse.  She has also thought that rytary 145 mg tid caused diplopia and she backed down to 95 mg 3 times per day.   She ultimately was given samples since our last visit on Rytary 145 mg and she had no diplopia.  She also had no side effects.  Regardless, she wants to go back to immediate release carbidopa/levodopa because of cost.  I reminded her that she thought she had side effects with this (although it was worsening of fibromyalgia and I do not think that this is a side effect).  She understands that and would like to.  We will give her Cardopa/levodopa 25/100, 1 tablet 3 times per day.  -Explained again that current walker does not prevent falls backwards.  She knows this and have discussed it several times in the past.  -Patient asks about going back home, which she already has scheduled.  I do not recommend this I recommend she continue in assisted living.  She is already in independent  assisted living.  -would recommend trying to get off ambien as many falls are in the nighttime and her daughter thinks Ambien is contributing.  -asks me about driving and I told her I don't want her to do that. 2.  Fall with fx of C5 spinous process  -did inpatient rehab stay after this.  Now doing subacute rehab  -talked to her about assisted living facilities.   2.  Fibromyalgia  -seeing Dr. Letta Pate 3.  Mild cognitive impairment  -Neuropsych testing done in July 2018 did not show evidence of dementia. 4.  Follow up is anticipated in the next few months, sooner should new neurologic issues arise.  Much greater than 50% of this visit was spent in counseling and coordinating care.  Total face to face time:  25 min

## 2017-05-16 NOTE — Telephone Encounter (Addendum)
Received Physician Orders from [place] [x2]; forwarded to provider/SLS 03/13

## 2017-05-17 DIAGNOSIS — M6281 Muscle weakness (generalized): Secondary | ICD-10-CM | POA: Diagnosis not present

## 2017-05-17 DIAGNOSIS — M25511 Pain in right shoulder: Secondary | ICD-10-CM | POA: Diagnosis not present

## 2017-05-17 DIAGNOSIS — R262 Difficulty in walking, not elsewhere classified: Secondary | ICD-10-CM | POA: Diagnosis not present

## 2017-05-17 DIAGNOSIS — R260 Ataxic gait: Secondary | ICD-10-CM | POA: Diagnosis not present

## 2017-05-17 DIAGNOSIS — M25512 Pain in left shoulder: Secondary | ICD-10-CM | POA: Diagnosis not present

## 2017-05-17 DIAGNOSIS — M542 Cervicalgia: Secondary | ICD-10-CM | POA: Diagnosis not present

## 2017-05-18 ENCOUNTER — Ambulatory Visit (INDEPENDENT_AMBULATORY_CARE_PROVIDER_SITE_OTHER): Payer: Medicare Other | Admitting: Neurology

## 2017-05-18 ENCOUNTER — Encounter: Payer: Self-pay | Admitting: Neurology

## 2017-05-18 ENCOUNTER — Telehealth: Payer: Self-pay | Admitting: Family Medicine

## 2017-05-18 VITALS — BP 124/80 | HR 95 | Ht 65.5 in | Wt 199.0 lb

## 2017-05-18 DIAGNOSIS — G20 Parkinson's disease: Secondary | ICD-10-CM

## 2017-05-18 DIAGNOSIS — G3184 Mild cognitive impairment, so stated: Secondary | ICD-10-CM | POA: Diagnosis not present

## 2017-05-18 DIAGNOSIS — R3121 Asymptomatic microscopic hematuria: Secondary | ICD-10-CM

## 2017-05-18 MED ORDER — CARBIDOPA-LEVODOPA 25-100 MG PO TABS: 1.0000 | ORAL_TABLET | Freq: Three times a day (TID) | ORAL | 1 refills | Status: DC

## 2017-05-18 NOTE — Patient Instructions (Signed)
Stop Rytary  Start Carbidopa Levodopa 25/100 IR - one tablet three times daily

## 2017-05-18 NOTE — Telephone Encounter (Signed)
Called to advise that I will order a renal CT.  She states understanding

## 2017-05-21 ENCOUNTER — Other Ambulatory Visit: Payer: Self-pay | Admitting: Family Medicine

## 2017-05-21 DIAGNOSIS — M25511 Pain in right shoulder: Secondary | ICD-10-CM | POA: Diagnosis not present

## 2017-05-21 DIAGNOSIS — M6281 Muscle weakness (generalized): Secondary | ICD-10-CM | POA: Diagnosis not present

## 2017-05-21 DIAGNOSIS — M542 Cervicalgia: Secondary | ICD-10-CM | POA: Diagnosis not present

## 2017-05-21 DIAGNOSIS — R262 Difficulty in walking, not elsewhere classified: Secondary | ICD-10-CM | POA: Diagnosis not present

## 2017-05-21 DIAGNOSIS — R260 Ataxic gait: Secondary | ICD-10-CM | POA: Diagnosis not present

## 2017-05-21 DIAGNOSIS — Z5181 Encounter for therapeutic drug level monitoring: Secondary | ICD-10-CM

## 2017-05-21 DIAGNOSIS — M25512 Pain in left shoulder: Secondary | ICD-10-CM | POA: Diagnosis not present

## 2017-05-22 DIAGNOSIS — R262 Difficulty in walking, not elsewhere classified: Secondary | ICD-10-CM | POA: Diagnosis not present

## 2017-05-22 DIAGNOSIS — M542 Cervicalgia: Secondary | ICD-10-CM | POA: Diagnosis not present

## 2017-05-22 DIAGNOSIS — R260 Ataxic gait: Secondary | ICD-10-CM | POA: Diagnosis not present

## 2017-05-22 DIAGNOSIS — M25511 Pain in right shoulder: Secondary | ICD-10-CM | POA: Diagnosis not present

## 2017-05-22 DIAGNOSIS — M25512 Pain in left shoulder: Secondary | ICD-10-CM | POA: Diagnosis not present

## 2017-05-22 DIAGNOSIS — M6281 Muscle weakness (generalized): Secondary | ICD-10-CM | POA: Diagnosis not present

## 2017-05-23 ENCOUNTER — Other Ambulatory Visit: Payer: Self-pay | Admitting: Emergency Medicine

## 2017-05-23 ENCOUNTER — Other Ambulatory Visit (INDEPENDENT_AMBULATORY_CARE_PROVIDER_SITE_OTHER): Payer: Medicare Other

## 2017-05-23 DIAGNOSIS — M6281 Muscle weakness (generalized): Secondary | ICD-10-CM | POA: Diagnosis not present

## 2017-05-23 DIAGNOSIS — R260 Ataxic gait: Secondary | ICD-10-CM | POA: Diagnosis not present

## 2017-05-23 DIAGNOSIS — Z86711 Personal history of pulmonary embolism: Secondary | ICD-10-CM

## 2017-05-23 DIAGNOSIS — R262 Difficulty in walking, not elsewhere classified: Secondary | ICD-10-CM | POA: Diagnosis not present

## 2017-05-23 DIAGNOSIS — M25511 Pain in right shoulder: Secondary | ICD-10-CM | POA: Diagnosis not present

## 2017-05-23 DIAGNOSIS — M25512 Pain in left shoulder: Secondary | ICD-10-CM | POA: Diagnosis not present

## 2017-05-23 DIAGNOSIS — Z5181 Encounter for therapeutic drug level monitoring: Secondary | ICD-10-CM | POA: Diagnosis not present

## 2017-05-23 DIAGNOSIS — M542 Cervicalgia: Secondary | ICD-10-CM | POA: Diagnosis not present

## 2017-05-23 LAB — BASIC METABOLIC PANEL
BUN: 18 mg/dL (ref 6–23)
CALCIUM: 10.7 mg/dL — AB (ref 8.4–10.5)
CHLORIDE: 105 meq/L (ref 96–112)
CO2: 29 mEq/L (ref 19–32)
CREATININE: 0.99 mg/dL (ref 0.40–1.20)
GFR: 58.24 mL/min — ABNORMAL LOW (ref >60.00)
Glucose, Bld: 150 mg/dL — ABNORMAL HIGH (ref 70–99)
Potassium: 5.6 mEq/L — ABNORMAL HIGH (ref 3.5–5.1)
Sodium: 144 mEq/L (ref 135–145)

## 2017-05-24 DIAGNOSIS — M542 Cervicalgia: Secondary | ICD-10-CM | POA: Diagnosis not present

## 2017-05-24 DIAGNOSIS — M25512 Pain in left shoulder: Secondary | ICD-10-CM | POA: Diagnosis not present

## 2017-05-24 DIAGNOSIS — R260 Ataxic gait: Secondary | ICD-10-CM | POA: Diagnosis not present

## 2017-05-24 DIAGNOSIS — M25511 Pain in right shoulder: Secondary | ICD-10-CM | POA: Diagnosis not present

## 2017-05-24 DIAGNOSIS — R262 Difficulty in walking, not elsewhere classified: Secondary | ICD-10-CM | POA: Diagnosis not present

## 2017-05-24 DIAGNOSIS — M6281 Muscle weakness (generalized): Secondary | ICD-10-CM | POA: Diagnosis not present

## 2017-05-25 ENCOUNTER — Other Ambulatory Visit (INDEPENDENT_AMBULATORY_CARE_PROVIDER_SITE_OTHER): Payer: Medicare Other

## 2017-05-25 ENCOUNTER — Ambulatory Visit (HOSPITAL_BASED_OUTPATIENT_CLINIC_OR_DEPARTMENT_OTHER)
Admission: RE | Admit: 2017-05-25 | Discharge: 2017-05-25 | Disposition: A | Discharge status: Home / Self Care | Payer: Medicare Other | Source: Ambulatory Visit | Attending: Family Medicine | Admitting: Family Medicine

## 2017-05-25 ENCOUNTER — Encounter (HOSPITAL_BASED_OUTPATIENT_CLINIC_OR_DEPARTMENT_OTHER): Payer: Self-pay

## 2017-05-25 DIAGNOSIS — Z86711 Personal history of pulmonary embolism: Secondary | ICD-10-CM | POA: Diagnosis not present

## 2017-05-25 DIAGNOSIS — N202 Calculus of kidney with calculus of ureter: Secondary | ICD-10-CM | POA: Diagnosis not present

## 2017-05-25 DIAGNOSIS — N200 Calculus of kidney: Secondary | ICD-10-CM | POA: Diagnosis not present

## 2017-05-25 DIAGNOSIS — D3501 Benign neoplasm of right adrenal gland: Secondary | ICD-10-CM | POA: Diagnosis not present

## 2017-05-25 DIAGNOSIS — R3121 Asymptomatic microscopic hematuria: Secondary | ICD-10-CM

## 2017-05-25 DIAGNOSIS — K760 Fatty (change of) liver, not elsewhere classified: Secondary | ICD-10-CM | POA: Diagnosis not present

## 2017-05-25 LAB — PROTIME-INR
INR: 2.1 ratio — AB (ref 0.8–1.0)
PROTHROMBIN TIME: 22.4 s — AB (ref 9.6–13.1)

## 2017-05-25 MED ORDER — IOPAMIDOL (ISOVUE-300) INJECTION 61%
100.0000 mL | Freq: Once | INTRAVENOUS | Status: AC | PRN
  Administered 2017-05-25: 100 mL via INTRAVENOUS

## 2017-05-25 MED ORDER — IOPAMIDOL (ISOVUE-300) INJECTION 61%
30.0000 mL | Freq: Once | INTRAVENOUS | Status: AC | PRN
  Administered 2017-05-25: 30 mL via INTRAVENOUS

## 2017-05-27 NOTE — Progress Notes (Addendum)
[place] at [facility] [address] [place], [place] [address] [phone]  Date:  05/28/2017   Name:  [name] [name]   DOB:  05-28-1945   MRN:  [id number]  PCP:  Darreld Mclean, MD    Chief Complaint: Follow-up (CT scan, abdomen friday) and Abdominal Pain (lower abdominal, started this morning)   History of Present Illness:  [name] [name] is a 71 y.o. very pleasant female patient who presents with the following:  Following up today We just did a CT of her abd/ pelvis due to complete evaluation for microhematuria that we need to discuss- it was benign, as follows.  Will fax to Dr. Mikle Bosworth office.  Discussed results with pt- she does have a couple of right sided stones, non obstructing  Ct Abdomen Pelvis W Wo Contrast  Result Date: 05/25/2017 CLINICAL DATA:  Hematuria. EXAM: CT ABDOMEN AND PELVIS WITHOUT AND WITH CONTRAST TECHNIQUE: Multidetector CT imaging of the abdomen and pelvis was performed following the standard protocol before and following the bolus administration of intravenous contrast. CONTRAST:  71mL ISOVUE-300 IOPAMIDOL (ISOVUE-300) INJECTION 61%, 153mL ISOVUE-300 IOPAMIDOL (ISOVUE-300) INJECTION 61% COMPARISON:  CT scan 09/07/2012. FINDINGS: Lower chest: The lung bases are clear of acute process. No pleural effusion or pulmonary lesions. The heart is normal in size. No pericardial effusion. Coronary artery and aortic calcifications are noted. The distal esophagus and aorta are unremarkable. Hepatobiliary: Diffuse fatty infiltration of the liver. No focal hepatic lesions or intrahepatic biliary dilatation. The portal and hepatic veins are patent. The gallbladder is surgically absent. No common bile duct dilatation. Pancreas: No mass, inflammation or ductal dilatation. Mild to moderate atrophy. Spleen: Normal size.  No focal lesions. Adrenals/Urinary Tract: Stable small right adrenal gland nodules consistent with benign  adenomas. The left adrenal gland is normal. Small right renal calculi. The largest calculus measures 4.5 mm in the upper pole region. There is also a 5.5 mm mid right ureteral calculus located at the level of the iliac crest but this is not causing any significant hydroureteronephrosis. No distal right ureteral calculi and no bladder calculi. No left-sided renal or ureteral calculi. After contrast administration both kidneys demonstrate normal enhancement/perfusion. No worrisome renal lesions. No collecting system abnormalities. No ureteral lesions and no bladder lesions. Stomach/Bowel: The stomach, duodenum, small bowel and colon are grossly normal without oral contrast. No inflammatory changes, mass lesions or obstructive findings. Vascular/Lymphatic: Advanced atherosclerotic calcifications involving the aorta, iliac arteries and branch vessels but no aneurysm. Small scattered mesenteric and retroperitoneal lymph nodes but no mass or lymphadenopathy. Reproductive: The uterus is surgically absent. The right ovary is still present and appears normal. I do not see the left ovary. Other: No pelvic mass or adenopathy. No free pelvic fluid collections. No inguinal mass or adenopathy. No abdominal wall hernia or subcutaneous lesions. Musculoskeletal: No significant bony findings. Degenerative changes involving the spine, SI joints and hips. IMPRESSION: 1. Right-sided renal calculi and 5.5 x 4.5 mm nonobstructing mid right ureteral calculus. 2. No left-sided renal or ureteral calculi. 3. No worrisome renal or bladder lesions. 4. No abdominal/pelvic mass lesions or adenopathy. 5. Diffuse and fairly marked fatty infiltration of the liver. 6. Stable right adrenal gland adenomas. Electronically Signed   By: Marijo Sanes M.D.   On: 05/25/2017 14:49   She also saw Dr. [name] about 10 days ago: 1.  Parkinsonism             -I used to think  that [place] was in the differential, but I have seen her for quite some time and do not  think that she has MSA.             -  She has some mild cervical dystonia (has difficulty turning the head to the right) and had early diplopia and falls.  Her DaT scan demonstrated decreased uptake of the tracer in the right putamen, but it was very mild.   I am not convinced that everything that she reports as "side effects" of levodopa are really side effects.  She thought that it made her fibromyalgia worse.  She has also thought that rytary 145 mg tid caused diplopia and she backed down to 95 mg 3 times per day.   She ultimately was given samples since our last visit on Rytary 145 mg and she had no diplopia.  She also had no side effects.  Regardless, she wants to go back to immediate release carbidopa/levodopa because of cost.  I reminded her that she thought she had side effects with this (although it was worsening of fibromyalgia and I do not think that this is a side effect).  She understands that and would like to.  We will give her Cardopa/levodopa 25/100, 1 tablet 3 times per day.             -Explained again that current walker does not prevent falls backwards.  She knows this and have discussed it several times in the past.             -Patient asks about going back home, which she already has scheduled.  I do not recommend this I recommend she continue in assisted living.  She is already in independent assisted living.             -would recommend trying to get off ambien as many falls are in the nighttime and her daughter thinks Ambien is contributing.             -asks me about driving and I told her I don't want her to do that. 2.  Fall with fx of C5 spinous process             -did inpatient rehab stay after this.  Now doing subacute rehab             -talked to her about assisted living facilities.   2.  Fibromyalgia             -seeing Dr. Letta Pate 3.  Mild cognitive impairment             -Neuropsych testing done in July 2018 did not show evidence of dementia.   She however did  notice onset of lower abd pain this am on the way here- about an hour ago It seemed to start after she ate a biscuit No vomiting No diarrhea No urinary sx at the moment   She is not able to get her home INR to work any longer so she has been coming in to see [name] for INR checks.  Has been in range at last several checks  Lab Results  Component Value Date   INR 2.1 (H) 05/25/2017   INR 2.4 04/23/2017   INR 2.2 03/22/2017   PROTIME 32.4 (H) 02/14/2011   PROTIME 36.0 (H) 07/26/2010    She is on generic ambien 5 mg but does not feel that it works.  She has been taking 7.5 mg some of the time  in order to sleep. Will change her to brand only and will see how this works for her  Here today with her grandson  Pulse Readings from Last 3 Encounters:  05/28/17 (!) 106  05/18/17 95  04/23/17 81    Patient Active Problem List   Diagnosis Date Noted  . Sepsis secondary to UTI ([place]) 02/12/2017  . Elevated troponin 02/12/2017  . AKI (acute kidney injury) ([place]) 02/12/2017  . Recurrent UTI 11/23/2016  . Fall at nursing home 11/23/2016  . PD (Parkinson's disease) ([place]) 11/23/2016  . Sepsis due to urinary tract infection ([place]) 11/23/2016  . Fibromyalgia 11/23/2016  . Hypercholesterolemia 11/23/2016  . Hypertension 11/23/2016  . Hypothyroidism, adult 11/23/2016  . Elevated CK 11/23/2016  . Pulmonary HTN ([place]) 11/23/2016  . Left ventricular diastolic dysfunction 92/42/6834  . Metabolic encephalopathy 19/62/2297  . History of pulmonary embolus (PE) 2/2 hypercoagulable state 11/23/2016  . Sepsis ([place]) 09/17/2016  . Acute metabolic encephalopathy 98/92/1194  . Cellulitis of right foot 09/17/2016  . Fall   . UTI (urinary tract infection) 04/29/2016  . SIRS (systemic inflammatory response syndrome) ([place]) 04/29/2016  . Hyperlipidemia 04/29/2016  . Hypothyroidism 04/29/2016  . Hematuria 04/05/2016  . Cough 04/05/2016  . Benign essential HTN   . Intractable pain 12/17/2015  . Displaced  fracture of fifth cervical vertebra ([place]) 12/17/2015  . Gait disturbance 12/17/2015  . Trochanteric bursitis of both hips 09/13/2015  . Chronic low back pain 06/07/2015  . Fibromyalgia syndrome 05/17/2015  . Thyroid disease 05/17/2015  . Parkinson's disease ([place]) 05/17/2015  . Hypercoagulable state ([place]) 02/07/2012  . History of pulmonary embolism 02/07/2012  . S/P right TKA 05/22/2011    Past Medical History:  Diagnosis Date  . Arthritis   . Chronic insomnia   . Complication of anesthesia    severe itching night of surgery requiring meds- also couldnt swallow- throat "paralyzed""  . Fibromyalgia   . Glaucoma   . Hypercholesterolemia   . Hyperlipidemia   . Hypertension    PCP Dr Cari Caraway- [place]  05/15/11 with clearance and note on chart,    chest x ray, EKG 12/12 EP[facility], eccho 7/11 EP[facility]  . Hypothyroidism    s/p graves disease  . Kidney stone   . PD (Parkinson's disease) ([place])   . Peripheral vascular disease ([place])    PULMONARY EMBOLUS x 2- 2011, 2012/ FOLLOWED BY DR [id number] 12/12 EP[facility]   PT [facility] WILL STOP COUMADIN 3/12 and begin LOVONOX 05/17/11 as previously instructed  . Sinus infection    at present- dr Honor Loh PA aware- was seen there and at PCP 05/10/11  . Thyroid disease    Hypothyroidism    Past Surgical History:  Procedure Laterality Date  . ABDOMINAL HYSTERECTOMY    . APPENDECTOMY    . CATARACT EXTRACTION Bilateral   . CHOLECYSTECTOMY    . COLONOSCOPY    . EXPLORATORY LAPAROTOMY    . JOINT REPLACEMENT     left knee  7/12  . TOTAL KNEE ARTHROPLASTY  05/22/2011   Procedure: TOTAL KNEE ARTHROPLASTY;  Surgeon: Mauri Pole, MD;  Location: [facility];  Service: Orthopedics;  Laterality: Right;    Social History   Tobacco Use  . Smoking status: Former Smoker    Packs/day: 1.00    Last attempt to quit: 05/10/1989    Years since quitting: 28.0  . Smokeless tobacco: Never Used  Substance Use Topics  . Alcohol use: Yes    Alcohol/week: 0.0 oz    Comment: one  drink  once a month  . Drug use: No    Family History  Problem Relation Age of Onset  . Prostate cancer Father   . Stroke Father     Allergies  Allergen Reactions  . Crestor [Rosuvastatin Calcium] Other (See Comments)    Feeling very bad, aching all over.  . Erythromycin Hives  . Gabapentin Other (See Comments)    Blurred vision  . Pregabalin Other (See Comments)    Crossed vision.  . Carbidopa Other (See Comments)    Headaches, nausea, dizziness  . Macrobid [Nitrofurantoin] Hives  . Losartan Itching    Medication list has been reviewed and updated.  Current Outpatient Medications on File Prior to Visit  Medication Sig Dispense Refill  . acetaminophen (TYLENOL) 500 MG tablet Take 500 mg by mouth every 6 (six) hours as needed for mild pain.    Marland Kitchen albuterol (PROVENTIL HFA;VENTOLIN HFA) 108 (90 Base) MCG/ACT inhaler Inhale 2 puffs into the lungs every 6 (six) hours as needed for wheezing or shortness of breath. 1 Inhaler 0  . atorvastatin (LIPITOR) 10 MG tablet Take 1 tablet (10 mg total) by mouth daily. 90 tablet 3  . carbidopa-levodopa (SINEMET IR) 25-100 MG tablet Take 1 tablet by mouth 3 (three) times daily. 270 tablet 1  . celecoxib (CELEBREX) 100 MG capsule Take 1 capsule (100 mg total) by mouth 2 (two) times daily. 180 capsule 3  . cetirizine (ZYRTEC) 10 MG tablet Take 1 tablet (10 mg total) by mouth daily. 30 tablet 1  . Cholecalciferol (VITAMIN D3) 5000 UNITS CAPS Take 5,000 Units by mouth daily.     . cyanocobalamin 500 MCG tablet Take 500 mcg by mouth daily.    . cycloSPORINE (RESTASIS) 0.05 % ophthalmic emulsion Place 1 drop into both eyes 2 (two) times daily.    . fluticasone (FLONASE) 50 MCG/ACT nasal spray Place 1 spray into both nostrils daily.    Marland Kitchen ketoconazole (NIZORAL) 2 % shampoo Apply 1 application topically 2 (two) times a week. 120 mL 3  . lidocaine (LIDODERM) 5 % Place 1 patch onto the skin daily. Remove & Discard patch within 12 hours or as directed by MD 30  patch 6  . magnesium oxide (MAG-OX) 400 (241.3 Mg) MG tablet Take 0.5 tablets (200 mg total) by mouth daily. 30 tablet 0  . Melatonin 5 MG TABS Take 1 tablet by mouth at bedtime as needed (for sleep).    . Menthol, Topical Analgesic, (BIOFREEZE EX) Apply 1 application topically daily as needed (for pain).    . nicotine polacrilex (NICORETTE) 2 MG gum Take 2 mg by mouth as needed for smoking cessation.    . Omega-3 Fatty Acids (FISH OIL PO) Take 1 capsule by mouth daily.    . ondansetron (ZOFRAN ODT) 4 MG disintegrating tablet Take 1 tablet (4 mg total) by mouth every 8 (eight) hours as needed for nausea or vomiting. 20 tablet 0  . Oxycodone HCl 10 MG TABS Take 1/2 tablet daily as needed for neck pain. If pain persists may take another 1/2 tablet per day 5 tablet 0  . SYNTHROID 150 MCG tablet Take 1 tablet (150 mcg total) by mouth daily before breakfast. 90 tablet 3  . telmisartan-hydrochlorothiazide (MICARDIS HCT) 40-12.5 MG tablet Take 1 tablet by mouth daily. 90 tablet 3  . venlafaxine XR (EFFEXOR-XR) 150 MG 24 hr capsule Take 1 capsule (150 mg total) by mouth daily with breakfast. 30 capsule 9  . warfarin (COUMADIN) 4 MG tablet  Take 1 tablet (4 mg total) by mouth See admin instructions. Adjust as directed by MD (Patient taking differently: Take 4 mg by mouth daily. Adjust as directed by MD) 90 tablet 3  . zolpidem (AMBIEN) 5 MG tablet TAKE 1 TABLET BY MOUTH ONCE DAILY AT BEDTIME AS NEEDED FOR SLEEP DO  NOT  COMBINE  WITH  OTHER  SEDATING  MEDICATIONS 30 tablet 2   No current facility-administered medications on file prior to visit.     Review of Systems:  As per HPI- otherwise negative.   Physical Examination: Vitals:   05/28/17 1255  BP: 131/82  Pulse: (!) 106  Resp: 18  SpO2: 94%   Vitals:   05/28/17 1255  Weight: 195 lb 3.2 oz (88.5 kg)  Height: 5' 5.5" (1.664 m)   Body mass index is 31.99 kg/m. Ideal Body Weight: Weight in (lb) to have BMI = 25: 152.2  GEN: WDWN, NAD,  Non-toxic, A & O x 3, looks well, mild parkinsonism is apparent HEENT: Atraumatic, Normocephalic. Neck supple. No masses, No LAD. Ears and Nose: No external deformity. CV: RRR, No M/G/R. No JVD. No thrill. No extra heart sounds. PULM: CTA B, no wheezes, crackles, rhonchi. No retractions. No resp. distress. No accessory muscle use. ABD: S,  ND, +BS. No rebound. No HSM. Notes mild tenderness in the suprapubic area but not an acute abdomen on exam EXTR: No c/c/e NEURO Normal gait for pt, slow, uses a walker.  Able to get on exam table with assistance  PSYCH: Normally interactive. Conversant. Not depressed or anxious appearing.  Calm demeanor.   Results for orders placed or performed in visit on 05/28/17  POCT urinalysis dipstick  Result Value Ref Range   Color, UA orange (A) yellow   Clarity, UA cloudy (A) clear   Glucose, UA negative negative mg/dL   Bilirubin, UA negative negative   Ketones, POC UA negative negative mg/dL   Spec Grav, UA >=1.030 (A) 1.010 - 1.025   Blood, UA negative negative   pH, UA 6.0 5.0 - 8.0   Protein Ur, POC negative negative mg/dL   Urobilinogen, UA 0.2 0.2 or 1.0 E.U./dL   Nitrite, UA Negative Negative   Leukocytes, UA Negative Negative    Assessment and Plan: Hyperkalemia - Plan: Comprehensive metabolic panel  Hypercalcemia - Plan: PTH, intact (no Ca)  Microhematuria  Suprapubic pain - Plan: CBC, Urine Culture, POCT urinalysis dipstick  Insomnia, unspecified type - Plan: zolpidem (AMBIEN) 5 MG tablet  Following up today Need to recheck her K and Ca, will also get PTH in case her calcium is still high  She notes suprapubic pain that just started about an hour ago.  Her exam is not acute.  Will get a CBC for her today, also check urine.  Pain may be due to recently imaged right urteteral stone.  She will seek emergency care if this gets worse or continues while labs are pending     Signed Lamar Blinks, MD  Received her labs  No  leukocytosis Potasium and calcium are back to normal Await urine culture 4/2- urine culture negative Her PTH is slightly high.  Will ask endocrinology if she needs to be evaluated for hyperparathyroidism Called pt: LMOM, no UTI.  I am working out if she needs to see endocrinology and will get back with her asap   4/5 Heard back from endocrinology as below, called pt but did not reach. Will have to try back   I would suggest to  stop her HCTZ first and also check a vitamin D to make sure not low. If PTH/Calcium still abnormal after being off HCTZ for ~1 mo and vitamin D normal, yes, we need to see her. Alternatively, you can refer her and we will do all of the above before rechecking.  Hope this helps.   Called pt again 4/8 and was able to reach her.  Explained as above- will call in palin telmisartan without HCTZ for her to take for one month, and will order a calcium/ PTH and vit D for her to have done as a lab visit only in one month She states understanding   BP Readings from Last 3 Encounters:  05/28/17 131/82  05/18/17 124/80  04/23/17 (!) 135/91     Results for orders placed or performed in visit on 05/28/17  Urine Culture  Result Value Ref Range   MICRO NUMBER: [id number]    SPECIMEN QUALITY: ADEQUATE    Sample Source NOT GIVEN    STATUS: FINAL    ISOLATE 1:      Multiple organisms present, each less than 10,000 CFU/mL. These organisms, commonly found on external and internal genitalia, are considered to be colonizers. No further testing performed.  PTH, intact (no Ca)  Result Value Ref Range   PTH 70 (H) 14 - 64 pg/mL  CBC  Result Value Ref Range   WBC 7.5 4.0 - 10.5 K/uL   RBC 5.29 (H) 3.87 - 5.11 Mil/uL   Platelets 332.0 150.0 - 400.0 K/uL   Hemoglobin 15.2 (H) 12.0 - 15.0 g/dL   HCT 46.4 (H) 36.0 - 46.0 %   MCV 87.7 78.0 - 100.0 fl   MCHC 32.8 30.0 - 36.0 g/dL   RDW 13.3 11.5 - 15.5 %  Comprehensive metabolic panel  Result Value Ref Range   Sodium 140 135 - 145  mEq/L   Potassium 4.4 3.5 - 5.1 mEq/L   Chloride 104 96 - 112 mEq/L   CO2 29 19 - 32 mEq/L   Glucose, Bld 169 (H) 70 - 99 mg/dL   BUN 16 6 - 23 mg/dL   Creatinine, Ser 0.86 0.40 - 1.20 mg/dL   Total Bilirubin 0.6 0.2 - 1.2 mg/dL   Alkaline Phosphatase 106 39 - 117 U/L   AST 30 0 - 37 U/L   ALT 10 0 - 35 U/L   Total Protein 7.4 6.0 - 8.3 g/dL   Albumin 3.8 3.5 - 5.2 g/dL   Calcium 9.9 8.4 - 10.5 mg/dL   GFR 68.51 >60.00 mL/min  POCT urinalysis dipstick  Result Value Ref Range   Color, UA orange (A) yellow   Clarity, UA cloudy (A) clear   Glucose, UA negative negative mg/dL   Bilirubin, UA negative negative   Ketones, POC UA negative negative mg/dL   Spec Grav, UA >=1.030 (A) 1.010 - 1.025   Blood, UA negative negative   pH, UA 6.0 5.0 - 8.0   Protein Ur, POC negative negative mg/dL   Urobilinogen, UA 0.2 0.2 or 1.0 E.U./dL   Nitrite, UA Negative Negative   Leukocytes, UA Negative Negative

## 2017-05-28 ENCOUNTER — Ambulatory Visit (INDEPENDENT_AMBULATORY_CARE_PROVIDER_SITE_OTHER): Payer: Medicare Other | Admitting: Family Medicine

## 2017-05-28 ENCOUNTER — Telehealth: Payer: Self-pay | Admitting: *Deleted

## 2017-05-28 ENCOUNTER — Ambulatory Visit: Payer: Medicare Other | Admitting: Family Medicine

## 2017-05-28 ENCOUNTER — Encounter: Payer: Self-pay | Admitting: Family Medicine

## 2017-05-28 VITALS — BP 131/82 | HR 96 | Resp 18 | Ht 65.5 in | Wt 195.2 lb

## 2017-05-28 DIAGNOSIS — G4700 Insomnia, unspecified: Secondary | ICD-10-CM | POA: Diagnosis not present

## 2017-05-28 DIAGNOSIS — L400 Psoriasis vulgaris: Secondary | ICD-10-CM | POA: Diagnosis not present

## 2017-05-28 DIAGNOSIS — E875 Hyperkalemia: Secondary | ICD-10-CM

## 2017-05-28 DIAGNOSIS — L218 Other seborrheic dermatitis: Secondary | ICD-10-CM | POA: Diagnosis not present

## 2017-05-28 DIAGNOSIS — R3129 Other microscopic hematuria: Secondary | ICD-10-CM

## 2017-05-28 DIAGNOSIS — R102 Pelvic and perineal pain: Secondary | ICD-10-CM

## 2017-05-28 DIAGNOSIS — Z85828 Personal history of other malignant neoplasm of skin: Secondary | ICD-10-CM | POA: Diagnosis not present

## 2017-05-28 LAB — CBC
HEMATOCRIT: 46.4 % — AB (ref 36.0–46.0)
Hemoglobin: 15.2 g/dL — ABNORMAL HIGH (ref 12.0–15.0)
MCHC: 32.8 g/dL (ref 30.0–36.0)
MCV: 87.7 fl (ref 78.0–100.0)
Platelets: 332 10*3/uL (ref 150.0–400.0)
RBC: 5.29 Mil/uL — AB (ref 3.87–5.11)
RDW: 13.3 % (ref 11.5–15.5)
WBC: 7.5 10*3/uL (ref 4.0–10.5)

## 2017-05-28 LAB — COMPREHENSIVE METABOLIC PANEL
ALBUMIN: 3.8 g/dL (ref 3.5–5.2)
ALT: 10 U/L (ref 0–35)
AST: 30 U/L (ref 0–37)
Alkaline Phosphatase: 106 U/L (ref 39–117)
BUN: 16 mg/dL (ref 6–23)
CO2: 29 mEq/L (ref 19–32)
CREATININE: 0.86 mg/dL (ref 0.40–1.20)
Calcium: 9.9 mg/dL (ref 8.4–10.5)
Chloride: 104 mEq/L (ref 96–112)
GFR: 68.51 mL/min (ref >60.00)
Glucose, Bld: 169 mg/dL — ABNORMAL HIGH (ref 70–99)
Potassium: 4.4 mEq/L (ref 3.5–5.1)
SODIUM: 140 meq/L (ref 135–145)
TOTAL PROTEIN: 7.4 g/dL (ref 6.0–8.3)
Total Bilirubin: 0.6 mg/dL (ref 0.2–1.2)

## 2017-05-28 LAB — POCT URINALYSIS DIP (MANUAL ENTRY)
BILIRUBIN UA: NEGATIVE mg/dL
Bilirubin, UA: NEGATIVE
Blood, UA: NEGATIVE
Glucose, UA: NEGATIVE mg/dL
Leukocytes, UA: NEGATIVE
Nitrite, UA: NEGATIVE
PH UA: 6 (ref 5.0–8.0)
PROTEIN UA: NEGATIVE mg/dL
Spec Grav, UA: >=1.03 — AB (ref 1.010–1.025)
Urobilinogen, UA: 0.2 E.U./dL

## 2017-05-28 MED ORDER — ZOLPIDEM TARTRATE 5 MG PO TABS: 5.0000 mg | ORAL_TABLET | Freq: Every evening | ORAL | 1 refills | Status: DC | PRN

## 2017-05-28 NOTE — Patient Instructions (Signed)
Urine so far looks ok- I am going to send it for a culture and will also get blood today.  Will give you a call If your abdominal pain continues into tomorrow please call me- ER if it gets bad

## 2017-05-28 NOTE — Telephone Encounter (Signed)
Received Physician Orders/Plan of Care from [facility]; forwarded to provider/SLS 03/25

## 2017-05-29 LAB — URINE CULTURE
MICRO NUMBER: 90369759
SPECIMEN QUALITY: ADEQUATE

## 2017-05-29 LAB — PARATHYROID HORMONE, INTACT (NO CA): PTH: 70 pg/mL — ABNORMAL HIGH (ref 14–64)

## 2017-05-30 DIAGNOSIS — M25511 Pain in right shoulder: Secondary | ICD-10-CM | POA: Diagnosis not present

## 2017-05-30 DIAGNOSIS — R260 Ataxic gait: Secondary | ICD-10-CM | POA: Diagnosis not present

## 2017-05-30 DIAGNOSIS — M542 Cervicalgia: Secondary | ICD-10-CM | POA: Diagnosis not present

## 2017-05-30 DIAGNOSIS — M25512 Pain in left shoulder: Secondary | ICD-10-CM | POA: Diagnosis not present

## 2017-05-30 DIAGNOSIS — M6281 Muscle weakness (generalized): Secondary | ICD-10-CM | POA: Diagnosis not present

## 2017-05-30 DIAGNOSIS — R262 Difficulty in walking, not elsewhere classified: Secondary | ICD-10-CM | POA: Diagnosis not present

## 2017-06-01 ENCOUNTER — Telehealth: Payer: Self-pay | Admitting: *Deleted

## 2017-06-01 DIAGNOSIS — M25511 Pain in right shoulder: Secondary | ICD-10-CM | POA: Diagnosis not present

## 2017-06-01 DIAGNOSIS — M6281 Muscle weakness (generalized): Secondary | ICD-10-CM | POA: Diagnosis not present

## 2017-06-01 DIAGNOSIS — M542 Cervicalgia: Secondary | ICD-10-CM | POA: Diagnosis not present

## 2017-06-01 DIAGNOSIS — R262 Difficulty in walking, not elsewhere classified: Secondary | ICD-10-CM | POA: Diagnosis not present

## 2017-06-01 DIAGNOSIS — M25512 Pain in left shoulder: Secondary | ICD-10-CM | POA: Diagnosis not present

## 2017-06-01 DIAGNOSIS — R260 Ataxic gait: Secondary | ICD-10-CM | POA: Diagnosis not present

## 2017-06-01 NOTE — Telephone Encounter (Signed)
Received Physician Orders/Plan of Care from [facility]; forwarded to provider/SLS 03/29

## 2017-06-04 DIAGNOSIS — R262 Difficulty in walking, not elsewhere classified: Secondary | ICD-10-CM | POA: Diagnosis not present

## 2017-06-04 DIAGNOSIS — M542 Cervicalgia: Secondary | ICD-10-CM | POA: Diagnosis not present

## 2017-06-04 DIAGNOSIS — Z9181 History of falling: Secondary | ICD-10-CM | POA: Diagnosis not present

## 2017-06-04 DIAGNOSIS — R260 Ataxic gait: Secondary | ICD-10-CM | POA: Diagnosis not present

## 2017-06-04 DIAGNOSIS — M25512 Pain in left shoulder: Secondary | ICD-10-CM | POA: Diagnosis not present

## 2017-06-04 DIAGNOSIS — M25511 Pain in right shoulder: Secondary | ICD-10-CM | POA: Diagnosis not present

## 2017-06-04 DIAGNOSIS — M6281 Muscle weakness (generalized): Secondary | ICD-10-CM | POA: Diagnosis not present

## 2017-06-06 DIAGNOSIS — M25511 Pain in right shoulder: Secondary | ICD-10-CM | POA: Diagnosis not present

## 2017-06-06 DIAGNOSIS — M25512 Pain in left shoulder: Secondary | ICD-10-CM | POA: Diagnosis not present

## 2017-06-06 DIAGNOSIS — M542 Cervicalgia: Secondary | ICD-10-CM | POA: Diagnosis not present

## 2017-06-06 DIAGNOSIS — M6281 Muscle weakness (generalized): Secondary | ICD-10-CM | POA: Diagnosis not present

## 2017-06-06 DIAGNOSIS — R262 Difficulty in walking, not elsewhere classified: Secondary | ICD-10-CM | POA: Diagnosis not present

## 2017-06-06 DIAGNOSIS — R260 Ataxic gait: Secondary | ICD-10-CM | POA: Diagnosis not present

## 2017-06-07 DIAGNOSIS — M25512 Pain in left shoulder: Secondary | ICD-10-CM | POA: Diagnosis not present

## 2017-06-07 DIAGNOSIS — M542 Cervicalgia: Secondary | ICD-10-CM | POA: Diagnosis not present

## 2017-06-07 DIAGNOSIS — R260 Ataxic gait: Secondary | ICD-10-CM | POA: Diagnosis not present

## 2017-06-07 DIAGNOSIS — R262 Difficulty in walking, not elsewhere classified: Secondary | ICD-10-CM | POA: Diagnosis not present

## 2017-06-07 DIAGNOSIS — M6281 Muscle weakness (generalized): Secondary | ICD-10-CM | POA: Diagnosis not present

## 2017-06-07 DIAGNOSIS — M25511 Pain in right shoulder: Secondary | ICD-10-CM | POA: Diagnosis not present

## 2017-06-11 MED ORDER — TELMISARTAN 40 MG PO TABS: 40.0000 mg | ORAL_TABLET | Freq: Every day | ORAL | 0 refills | Status: DC

## 2017-06-11 NOTE — Addendum Note (Signed)
Addended by: Lamar Blinks C on: 06/11/2017 05:27 PM   Modules accepted: Orders

## 2017-06-13 DIAGNOSIS — R260 Ataxic gait: Secondary | ICD-10-CM | POA: Diagnosis not present

## 2017-06-13 DIAGNOSIS — R262 Difficulty in walking, not elsewhere classified: Secondary | ICD-10-CM | POA: Diagnosis not present

## 2017-06-13 DIAGNOSIS — M542 Cervicalgia: Secondary | ICD-10-CM | POA: Diagnosis not present

## 2017-06-13 DIAGNOSIS — M25562 Pain in left knee: Secondary | ICD-10-CM | POA: Diagnosis not present

## 2017-06-13 DIAGNOSIS — M6281 Muscle weakness (generalized): Secondary | ICD-10-CM | POA: Diagnosis not present

## 2017-06-13 DIAGNOSIS — M25511 Pain in right shoulder: Secondary | ICD-10-CM | POA: Diagnosis not present

## 2017-06-13 DIAGNOSIS — M7061 Trochanteric bursitis, right hip: Secondary | ICD-10-CM | POA: Diagnosis not present

## 2017-06-13 DIAGNOSIS — M25512 Pain in left shoulder: Secondary | ICD-10-CM | POA: Diagnosis not present

## 2017-06-13 DIAGNOSIS — M7062 Trochanteric bursitis, left hip: Secondary | ICD-10-CM | POA: Diagnosis not present

## 2017-06-14 DIAGNOSIS — M6281 Muscle weakness (generalized): Secondary | ICD-10-CM | POA: Diagnosis not present

## 2017-06-14 DIAGNOSIS — M542 Cervicalgia: Secondary | ICD-10-CM | POA: Diagnosis not present

## 2017-06-14 DIAGNOSIS — R262 Difficulty in walking, not elsewhere classified: Secondary | ICD-10-CM | POA: Diagnosis not present

## 2017-06-14 DIAGNOSIS — M25511 Pain in right shoulder: Secondary | ICD-10-CM | POA: Diagnosis not present

## 2017-06-14 DIAGNOSIS — R260 Ataxic gait: Secondary | ICD-10-CM | POA: Diagnosis not present

## 2017-06-14 DIAGNOSIS — M25512 Pain in left shoulder: Secondary | ICD-10-CM | POA: Diagnosis not present

## 2017-06-15 DIAGNOSIS — R262 Difficulty in walking, not elsewhere classified: Secondary | ICD-10-CM | POA: Diagnosis not present

## 2017-06-15 DIAGNOSIS — M6281 Muscle weakness (generalized): Secondary | ICD-10-CM | POA: Diagnosis not present

## 2017-06-15 DIAGNOSIS — R260 Ataxic gait: Secondary | ICD-10-CM | POA: Diagnosis not present

## 2017-06-15 DIAGNOSIS — M25512 Pain in left shoulder: Secondary | ICD-10-CM | POA: Diagnosis not present

## 2017-06-15 DIAGNOSIS — M542 Cervicalgia: Secondary | ICD-10-CM | POA: Diagnosis not present

## 2017-06-15 DIAGNOSIS — M25511 Pain in right shoulder: Secondary | ICD-10-CM | POA: Diagnosis not present

## 2017-06-15 MED FILL — HYDROCODON-APAP 2.5-325: 2.5-325 | 7 days supply | Qty: 14 | Fill #0

## 2017-06-19 DIAGNOSIS — R260 Ataxic gait: Secondary | ICD-10-CM | POA: Diagnosis not present

## 2017-06-19 DIAGNOSIS — M6281 Muscle weakness (generalized): Secondary | ICD-10-CM | POA: Diagnosis not present

## 2017-06-19 DIAGNOSIS — R262 Difficulty in walking, not elsewhere classified: Secondary | ICD-10-CM | POA: Diagnosis not present

## 2017-06-19 DIAGNOSIS — M542 Cervicalgia: Secondary | ICD-10-CM | POA: Diagnosis not present

## 2017-06-19 DIAGNOSIS — M25511 Pain in right shoulder: Secondary | ICD-10-CM | POA: Diagnosis not present

## 2017-06-19 DIAGNOSIS — M25512 Pain in left shoulder: Secondary | ICD-10-CM | POA: Diagnosis not present

## 2017-06-20 DIAGNOSIS — R260 Ataxic gait: Secondary | ICD-10-CM | POA: Diagnosis not present

## 2017-06-20 DIAGNOSIS — M542 Cervicalgia: Secondary | ICD-10-CM | POA: Diagnosis not present

## 2017-06-20 DIAGNOSIS — M25512 Pain in left shoulder: Secondary | ICD-10-CM | POA: Diagnosis not present

## 2017-06-20 DIAGNOSIS — R262 Difficulty in walking, not elsewhere classified: Secondary | ICD-10-CM | POA: Diagnosis not present

## 2017-06-20 DIAGNOSIS — M6281 Muscle weakness (generalized): Secondary | ICD-10-CM | POA: Diagnosis not present

## 2017-06-20 DIAGNOSIS — M25511 Pain in right shoulder: Secondary | ICD-10-CM | POA: Diagnosis not present

## 2017-06-25 DIAGNOSIS — M6281 Muscle weakness (generalized): Secondary | ICD-10-CM | POA: Diagnosis not present

## 2017-06-25 DIAGNOSIS — M542 Cervicalgia: Secondary | ICD-10-CM | POA: Diagnosis not present

## 2017-06-25 DIAGNOSIS — M25511 Pain in right shoulder: Secondary | ICD-10-CM | POA: Diagnosis not present

## 2017-06-25 DIAGNOSIS — M25512 Pain in left shoulder: Secondary | ICD-10-CM | POA: Diagnosis not present

## 2017-06-25 DIAGNOSIS — R262 Difficulty in walking, not elsewhere classified: Secondary | ICD-10-CM | POA: Diagnosis not present

## 2017-06-25 DIAGNOSIS — R260 Ataxic gait: Secondary | ICD-10-CM | POA: Diagnosis not present

## 2017-06-27 ENCOUNTER — Ambulatory Visit: Payer: Medicare Other | Admitting: Neurology

## 2017-06-27 DIAGNOSIS — R260 Ataxic gait: Secondary | ICD-10-CM | POA: Diagnosis not present

## 2017-06-27 DIAGNOSIS — R262 Difficulty in walking, not elsewhere classified: Secondary | ICD-10-CM | POA: Diagnosis not present

## 2017-06-27 DIAGNOSIS — M6281 Muscle weakness (generalized): Secondary | ICD-10-CM | POA: Diagnosis not present

## 2017-06-27 DIAGNOSIS — M25511 Pain in right shoulder: Secondary | ICD-10-CM | POA: Diagnosis not present

## 2017-06-27 DIAGNOSIS — M542 Cervicalgia: Secondary | ICD-10-CM | POA: Diagnosis not present

## 2017-06-27 DIAGNOSIS — M25512 Pain in left shoulder: Secondary | ICD-10-CM | POA: Diagnosis not present

## 2017-06-28 ENCOUNTER — Other Ambulatory Visit: Payer: Self-pay | Admitting: Family Medicine

## 2017-06-29 DIAGNOSIS — M542 Cervicalgia: Secondary | ICD-10-CM | POA: Diagnosis not present

## 2017-06-29 DIAGNOSIS — M6281 Muscle weakness (generalized): Secondary | ICD-10-CM | POA: Diagnosis not present

## 2017-06-29 DIAGNOSIS — M25511 Pain in right shoulder: Secondary | ICD-10-CM | POA: Diagnosis not present

## 2017-06-29 DIAGNOSIS — R260 Ataxic gait: Secondary | ICD-10-CM | POA: Diagnosis not present

## 2017-06-29 DIAGNOSIS — M25512 Pain in left shoulder: Secondary | ICD-10-CM | POA: Diagnosis not present

## 2017-06-29 DIAGNOSIS — R262 Difficulty in walking, not elsewhere classified: Secondary | ICD-10-CM | POA: Diagnosis not present

## 2017-07-02 DIAGNOSIS — S064X0A Epidural hemorrhage without loss of consciousness, initial encounter: Secondary | ICD-10-CM | POA: Diagnosis not present

## 2017-07-02 DIAGNOSIS — S0990XA Unspecified injury of head, initial encounter: Secondary | ICD-10-CM | POA: Diagnosis not present

## 2017-07-03 ENCOUNTER — Emergency Department (HOSPITAL_COMMUNITY): Payer: Medicare Other

## 2017-07-03 ENCOUNTER — Encounter (HOSPITAL_COMMUNITY): Payer: Self-pay | Admitting: Emergency Medicine

## 2017-07-03 ENCOUNTER — Emergency Department (HOSPITAL_COMMUNITY)
Admission: EM | Admit: 2017-07-03 | Discharge: 2017-07-03 | Disposition: A | Discharge status: Home / Self Care | Payer: Medicare Other | Attending: Emergency Medicine | Admitting: Emergency Medicine

## 2017-07-03 ENCOUNTER — Other Ambulatory Visit: Payer: Self-pay

## 2017-07-03 DIAGNOSIS — E039 Hypothyroidism, unspecified: Secondary | ICD-10-CM | POA: Diagnosis not present

## 2017-07-03 DIAGNOSIS — W19XXXA Unspecified fall, initial encounter: Secondary | ICD-10-CM

## 2017-07-03 DIAGNOSIS — Z7901 Long term (current) use of anticoagulants: Secondary | ICD-10-CM | POA: Insufficient documentation

## 2017-07-03 DIAGNOSIS — Z87891 Personal history of nicotine dependence: Secondary | ICD-10-CM | POA: Diagnosis not present

## 2017-07-03 DIAGNOSIS — G20 Parkinson's disease: Secondary | ICD-10-CM | POA: Diagnosis not present

## 2017-07-03 DIAGNOSIS — S8391XA Sprain of unspecified site of right knee, initial encounter: Secondary | ICD-10-CM | POA: Diagnosis not present

## 2017-07-03 DIAGNOSIS — Y999 Unspecified external cause status: Secondary | ICD-10-CM | POA: Diagnosis not present

## 2017-07-03 DIAGNOSIS — S8991XA Unspecified injury of right lower leg, initial encounter: Secondary | ICD-10-CM | POA: Diagnosis not present

## 2017-07-03 DIAGNOSIS — M542 Cervicalgia: Secondary | ICD-10-CM | POA: Diagnosis not present

## 2017-07-03 DIAGNOSIS — Y92012 Bathroom of single-family (private) house as the place of occurrence of the external cause: Secondary | ICD-10-CM | POA: Insufficient documentation

## 2017-07-03 DIAGNOSIS — S0990XA Unspecified injury of head, initial encounter: Secondary | ICD-10-CM | POA: Diagnosis not present

## 2017-07-03 DIAGNOSIS — Y9301 Activity, walking, marching and hiking: Secondary | ICD-10-CM | POA: Insufficient documentation

## 2017-07-03 DIAGNOSIS — W01198A Fall on same level from slipping, tripping and stumbling with subsequent striking against other object, initial encounter: Secondary | ICD-10-CM | POA: Insufficient documentation

## 2017-07-03 DIAGNOSIS — I10 Essential (primary) hypertension: Secondary | ICD-10-CM | POA: Insufficient documentation

## 2017-07-03 DIAGNOSIS — Z79899 Other long term (current) drug therapy: Secondary | ICD-10-CM | POA: Diagnosis not present

## 2017-07-03 DIAGNOSIS — S199XXA Unspecified injury of neck, initial encounter: Secondary | ICD-10-CM | POA: Diagnosis not present

## 2017-07-03 DIAGNOSIS — S86911A Strain of unspecified muscle(s) and tendon(s) at lower leg level, right leg, initial encounter: Secondary | ICD-10-CM | POA: Diagnosis not present

## 2017-07-03 LAB — PROTIME-INR
INR: 2.23
Prothrombin Time: 24.5 seconds — ABNORMAL HIGH (ref 11.4–15.2)

## 2017-07-03 MED ORDER — HYDROCODONE-ACETAMINOPHEN 5-325 MG PO TABS
1.0000 | ORAL_TABLET | Freq: Once | ORAL | Status: AC
  Administered 2017-07-03: 1 via ORAL
  Filled 2017-07-03: qty 1
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
## 2017-07-03 NOTE — ED Provider Notes (Signed)
[facility] EMERGENCY DEPARTMENT Provider Note   CSN: [id number] Arrival date & time: 07/03/17  0059   History   Chief Complaint Chief Complaint  Patient presents with  . Fall    HPI [name] [name] is a 72 y.o. female.  The history is provided by the patient.  Fall  This is a new problem. Episode onset: Prior to arrival. The problem occurs constantly. The problem has not changed since onset.Associated symptoms include headaches. Pertinent negatives include no chest pain, no abdominal pain and no shortness of breath. The symptoms are aggravated by walking. The symptoms are relieved by rest.  Patient with history of fibromyalgia, Parkinson's, PE on Coumadin presents status/post fall She reports she has walking to the bathroom putting her hand on a chair and fell.  She reports striking her head.  No LOC.  She reports headache.  She also reports right knee pain.  She is having worsening neck pain as well.  No chest or abdominal pain.  No other new back pain.  No other new trauma extremity,  Past Medical History:  Diagnosis Date  . Arthritis   . Chronic insomnia   . Complication of anesthesia    severe itching night of surgery requiring meds- also couldnt swallow- throat "paralyzed""  . Fibromyalgia   . Glaucoma   . Hypercholesterolemia   . Hyperlipidemia   . Hypertension    PCP Dr Cari Caraway- [place]  05/15/11 with clearance and note on chart,    chest x ray, EKG 12/12 [facility], eccho 7/11 [facility]  . Hypothyroidism    s/p graves disease  . Kidney stone   . PD (Parkinson's disease) ([place])   . Peripheral vascular disease ([place])    PULMONARY EMBOLUS x 2- 2011, 2012/ FOLLOWED BY DR [id number]-LOV 12/12 [facility]   PT STAES WILL STOP COUMADIN 3/12 and begin LOVONOX 05/17/11 as previously instructed  . Sinus infection    at present- dr Honor Loh PA aware- was seen there and at PCP 05/10/11  . Thyroid disease    Hypothyroidism    Patient Active Problem List   Diagnosis Date Noted  .  Sepsis secondary to UTI ([place]) 02/12/2017  . Elevated troponin 02/12/2017  . AKI (acute kidney injury) ([place]) 02/12/2017  . Recurrent UTI 11/23/2016  . Fall at nursing home 11/23/2016  . PD (Parkinson's disease) ([place]) 11/23/2016  . Sepsis due to urinary tract infection ([place]) 11/23/2016  . Fibromyalgia 11/23/2016  . Hypercholesterolemia 11/23/2016  . Hypertension 11/23/2016  . Hypothyroidism, adult 11/23/2016  . Elevated CK 11/23/2016  . Pulmonary HTN ([place]) 11/23/2016  . Left ventricular diastolic dysfunction 03/47/4259  . Metabolic encephalopathy 56/38/7564  . History of pulmonary embolus (PE) 2/2 hypercoagulable state 11/23/2016  . Sepsis ([place]) 09/17/2016  . Acute metabolic encephalopathy 33/29/5188  . Cellulitis of right foot 09/17/2016  . Fall   . UTI (urinary tract infection) 04/29/2016  . SIRS (systemic inflammatory response syndrome) ([place]) 04/29/2016  . Hyperlipidemia 04/29/2016  . Hypothyroidism 04/29/2016  . Hematuria 04/05/2016  . Cough 04/05/2016  . Benign essential HTN   . Intractable pain 12/17/2015  . Displaced fracture of fifth cervical vertebra ([place]) 12/17/2015  . Gait disturbance 12/17/2015  . Trochanteric bursitis of both hips 09/13/2015  . Chronic low back pain 06/07/2015  . Fibromyalgia syndrome 05/17/2015  . Thyroid disease 05/17/2015  . Parkinson's disease ([place]) 05/17/2015  . Hypercoagulable state ([place]) 02/07/2012  . History of pulmonary embolism 02/07/2012  . S/P right TKA 05/22/2011    Past Surgical  History:  Procedure Laterality Date  . ABDOMINAL HYSTERECTOMY    . APPENDECTOMY    . CATARACT EXTRACTION Bilateral   . CHOLECYSTECTOMY    . COLONOSCOPY    . EXPLORATORY LAPAROTOMY    . JOINT REPLACEMENT     left knee  7/12  . TOTAL KNEE ARTHROPLASTY  05/22/2011   Procedure: TOTAL KNEE ARTHROPLASTY;  Surgeon: Mauri Pole, MD;  Location: [facility];  Service: Orthopedics;  Laterality: Right;     OB History   None      Home Medications    Prior  to Admission medications   Medication Sig Start Date End Date Taking? Authorizing Provider  acetaminophen (TYLENOL) 500 MG tablet Take 500 mg by mouth every 6 (six) hours as needed for mild pain.    [provider]  albuterol (PROVENTIL HFA;VENTOLIN HFA) 108 (90 Base) MCG/ACT inhaler Inhale 2 puffs into the lungs every 6 (six) hours as needed for wheezing or shortness of breath. 04/07/16   Hoyt Koch, MD  atorvastatin (LIPITOR) 10 MG tablet Take 1 tablet (10 mg total) by mouth daily. 08/16/16   [name], [name], MD  carbidopa-levodopa (SINEMET IR) 25-100 MG tablet Take 1 tablet by mouth 3 (three) times daily. 05/18/17   Tat, Eustace Quail, DO  celecoxib (CELEBREX) 100 MG capsule Take 1 capsule (100 mg total) by mouth 2 (two) times daily. 07/09/16   [name], [name], MD  cetirizine (ZYRTEC) 10 MG tablet Take 1 tablet (10 mg total) by mouth daily. 04/17/16   Shelda Pal, DO  Cholecalciferol (VITAMIN D3) 5000 UNITS CAPS Take 5,000 Units by mouth daily.     [provider]  cyanocobalamin 500 MCG tablet Take 500 mcg by mouth daily.    [provider]  cycloSPORINE (RESTASIS) 0.05 % ophthalmic emulsion Place 1 drop into both eyes 2 (two) times daily.    [provider]  fluticasone (FLONASE) 50 MCG/ACT nasal spray Place 1 spray into both nostrils daily.    [provider]  ketoconazole (NIZORAL) 2 % shampoo Apply 1 application topically 2 (two) times a week. 04/23/17   [name], [name], MD  lidocaine (LIDODERM) 5 % Place 1 patch onto the skin daily. Remove & Discard patch within 12 hours or as directed by MD 04/06/17   [name], [name], MD  magnesium oxide (MAG-OX) 400 (241.3 Mg) MG tablet Take 0.5 tablets (200 mg total) by mouth daily. 12/21/15   Robbie Lis, MD  Melatonin 5 MG TABS Take 1 tablet by mouth at bedtime as needed (for sleep).    [provider]  Menthol, Topical Analgesic, (BIOFREEZE EX) Apply 1 application topically  daily as needed (for pain).    [provider]  nicotine polacrilex (NICORETTE) 2 MG gum Take 2 mg by mouth as needed for smoking cessation.    [provider]  Omega-3 Fatty Acids (FISH OIL PO) Take 1 capsule by mouth daily.    [provider]  ondansetron (ZOFRAN ODT) 4 MG disintegrating tablet Take 1 tablet (4 mg total) by mouth every 8 (eight) hours as needed for nausea or vomiting. 06/27/16   Saguier, Percell Miller, PA-C  Oxycodone HCl 10 MG TABS Take 1/2 tablet daily as needed for neck pain. If pain persists may take another 1/2 tablet per day 02/15/17   Patrecia Pour, MD  SYNTHROID 150 MCG tablet Take 1 tablet (150 mcg total) by mouth daily before breakfast. 07/09/16   [name], [name], MD  telmisartan (  MICARDIS) 40 MG tablet TAKE 1 TABLET BY MOUTH ONCE DAILY 06/28/17   [name], [name], MD  telmisartan-hydrochlorothiazide (MICARDIS HCT) 40-12.5 MG tablet Take 1 tablet by mouth daily. 07/09/16   [name], [name], MD  venlafaxine XR (EFFEXOR-XR) 150 MG 24 hr capsule Take 1 capsule (150 mg total) by mouth daily with breakfast. 04/23/17   [name], [name], MD  warfarin (COUMADIN) 4 MG tablet Take 1 tablet (4 mg total) by mouth See admin instructions. Adjust as directed by MD Patient taking differently: Take 4 mg by mouth daily. Adjust as directed by MD 10/09/16   [name], [name], MD  zolpidem (AMBIEN) 5 MG tablet Take 1 tablet (5 mg total) by mouth at bedtime as needed for sleep. Pt requests brand only. Do not combine with any other sedating substance 05/28/17   [name], [name], MD    Family History Family History  Problem Relation Age of Onset  . Prostate cancer Father   . Stroke Father     Social History Social History   Tobacco Use  . Smoking status: Former Smoker    Packs/day: 1.00    Last attempt to quit: 05/10/1989    Years since quitting: 28.1  . Smokeless tobacco: Never Used  Substance Use Topics  . Alcohol use: Yes    Alcohol/week: 0.0 oz     Comment: one drink once a month  . Drug use: No     Allergies   Crestor [rosuvastatin calcium]; Erythromycin; Gabapentin; Pregabalin; Carbidopa; Macrobid [nitrofurantoin]; and Losartan   Review of Systems Review of Systems  Constitutional: Negative for fever.  Respiratory: Negative for shortness of breath.   Cardiovascular: Negative for chest pain.  Gastrointestinal: Negative for abdominal pain.  Musculoskeletal: Positive for neck pain. Negative for back pain.  Neurological: Positive for headaches.  All other systems reviewed and are negative.    Physical Exam Updated Vital Signs BP (!) 159/78   Pulse 79   Temp 97.9 F (36.6 C) (Oral)   Resp 19   Ht 1.676 m (5\' 6" )   Wt 87.1 kg (192 lb)   SpO2 97%   BMI 30.99 kg/m   Physical Exam  CONSTITUTIONAL: Elderly, flat affect HEAD: Posterior scalp hematoma, no active bleeding no other signs of trauma EYES: EOMI/PERRL ENMT: Mucous membranes moist NECK: supple no meningeal signs SPINE/BACK: Cervical spine tenderness, no thoracic or lumbar tenderness, no bruising/crepitance/stepoffs noted to spine CV: S1/S2 noted, no murmurs/rubs/gallops noted LUNGS: Lungs are clear to auscultation bilaterally, no apparent distress Chest-no focal chest wall tenderness or crepitus ABDOMEN: soft, nontender GU:no cva tenderness NEURO: Pt is awake/alert/appropriate, moves all extremitiesx4.  No facial droop.  GCS equals 15 EXTREMITIES: pulses normal/equal, full ROM, mild tenderness to right knee, pelvis stable, All other extremities/joints palpated/ranged and nontender SKIN: warm, color normal PSYCH: Flat affect  ED Treatments / Results  Labs (all labs ordered are listed, but only abnormal results are displayed) Labs Reviewed  PROTIME-INR - Abnormal; Notable for the following components:      Result Value   Prothrombin Time 24.5 (*)    All other components within normal limits    EKG None  Radiology Ct Head Wo Contrast  Result  Date: 07/03/2017 CLINICAL DATA:  Fall, hit head on ground with hematoma EXAM: CT HEAD WITHOUT CONTRAST CT CERVICAL SPINE WITHOUT CONTRAST TECHNIQUE: Multidetector CT imaging of the head and cervical spine was performed following the standard protocol without intravenous contrast. Multiplanar CT image reconstructions of the cervical spine were also generated.  COMPARISON:  02/11/2017, 12/07/2016 FINDINGS: CT HEAD FINDINGS Brain: No acute territorial infarction, hemorrhage or intracranial mass is visualized. Mild to moderate atrophy. Moderate small vessel ischemic changes of the white matter. Stable enlarged ventricles likely related to atrophy. Vascular: No hyperdense vessels.  Carotid vascular calcification. Skull: Normal. Negative for fracture or focal lesion. Sinuses/Orbits: Mild mucosal thickening in the ethmoid sinuses. No acute orbital abnormality. Other: Small right posterior parietal scalp hematoma CT CERVICAL SPINE FINDINGS Alignment: Straightening of the cervical spine. Facet alignment within normal limits. Skull base and vertebrae: Mild chronic compression of C5. No acute fracture is seen. Soft tissues and spinal canal: No prevertebral fluid or swelling. No visible canal hematoma. Disc levels: Mild diffuse degenerative changes with moderate degenerative changes at C6-C7. facet fusion C2 through C6. Marked left foraminal narrowing at C3-C4. Upper chest: Negative. Other: None IMPRESSION: 1. No CT evidence for acute intracranial abnormality. Atrophy and small vessel ischemic changes of the white matter 2. Straightening of the cervical spine without acute osseous abnormality. Chronic compression of C5. Electronically Signed   By: Donavan Foil M.D.   On: 07/03/2017 03:52   Ct Cervical Spine Wo Contrast  Result Date: 07/03/2017 CLINICAL DATA:  Fall, hit head on ground with hematoma EXAM: CT HEAD WITHOUT CONTRAST CT CERVICAL SPINE WITHOUT CONTRAST TECHNIQUE: Multidetector CT imaging of the head and cervical  spine was performed following the standard protocol without intravenous contrast. Multiplanar CT image reconstructions of the cervical spine were also generated. COMPARISON:  02/11/2017, 12/07/2016 FINDINGS: CT HEAD FINDINGS Brain: No acute territorial infarction, hemorrhage or intracranial mass is visualized. Mild to moderate atrophy. Moderate small vessel ischemic changes of the white matter. Stable enlarged ventricles likely related to atrophy. Vascular: No hyperdense vessels.  Carotid vascular calcification. Skull: Normal. Negative for fracture or focal lesion. Sinuses/Orbits: Mild mucosal thickening in the ethmoid sinuses. No acute orbital abnormality. Other: Small right posterior parietal scalp hematoma CT CERVICAL SPINE FINDINGS Alignment: Straightening of the cervical spine. Facet alignment within normal limits. Skull base and vertebrae: Mild chronic compression of C5. No acute fracture is seen. Soft tissues and spinal canal: No prevertebral fluid or swelling. No visible canal hematoma. Disc levels: Mild diffuse degenerative changes with moderate degenerative changes at C6-C7. facet fusion C2 through C6. Marked left foraminal narrowing at C3-C4. Upper chest: Negative. Other: None IMPRESSION: 1. No CT evidence for acute intracranial abnormality. Atrophy and small vessel ischemic changes of the white matter 2. Straightening of the cervical spine without acute osseous abnormality. Chronic compression of C5. Electronically Signed   By: Donavan Foil M.D.   On: 07/03/2017 03:52   Dg Knee Complete 4 Views Right  Result Date: 07/03/2017 CLINICAL DATA:  Fall EXAM: RIGHT KNEE - COMPLETE 4+ VIEW COMPARISON:  None. FINDINGS: Status post right knee replacement. No fracture or malalignment. Vascular calcification. IMPRESSION: Status post right knee replacement. No definite acute osseous abnormality. Electronically Signed   By: Donavan Foil M.D.   On: 07/03/2017 03:04    Procedures Procedures  Medications  Ordered in ED Medications  HYDROcodone-acetaminophen (NORCO/VICODIN) 5-325 MG per tablet 1 tablet (1 tablet Oral Given 07/03/17 0217)     Initial Impression / Assessment and Plan / ED Course  I have reviewed the triage vital signs and the nursing notes.  Pertinent labs & imaging results that were available during my care of the patient were reviewed by me and considered in my medical decision making (see chart for details).     She is now feeling improved.  After eating some crackers she was able to ambulate and feels improved.  She denies any new dizziness She feels comfortable for discharge.  All imaging is negative.  Coumadin is therapeutic.  We discussed strict return precautions  Final Clinical Impressions(s) / ED Diagnoses   Final diagnoses:  Fall, initial encounter  Injury of head, initial encounter  Knee strain, right, initial encounter    ED Discharge Orders    None       Ripley Fraise, MD 07/03/17 [phone]

## 2017-07-03 NOTE — ED Notes (Signed)
Patient transported to X-ray and CT 

## 2017-07-03 NOTE — Discharge Instructions (Addendum)

## 2017-07-03 NOTE — ED Notes (Signed)
Ambulated with pt. In the hallway. She complained of feeling "extremely dizziness". Made it about 10 feet. Gait was very unsteady. Will atempt to ambulate again with walker.

## 2017-07-04 DIAGNOSIS — R260 Ataxic gait: Secondary | ICD-10-CM | POA: Diagnosis not present

## 2017-07-04 DIAGNOSIS — R262 Difficulty in walking, not elsewhere classified: Secondary | ICD-10-CM | POA: Diagnosis not present

## 2017-07-04 DIAGNOSIS — M542 Cervicalgia: Secondary | ICD-10-CM | POA: Diagnosis not present

## 2017-07-04 DIAGNOSIS — M6281 Muscle weakness (generalized): Secondary | ICD-10-CM | POA: Diagnosis not present

## 2017-07-04 DIAGNOSIS — M25512 Pain in left shoulder: Secondary | ICD-10-CM | POA: Diagnosis not present

## 2017-07-04 DIAGNOSIS — M25511 Pain in right shoulder: Secondary | ICD-10-CM | POA: Diagnosis not present

## 2017-07-04 DIAGNOSIS — Z9181 History of falling: Secondary | ICD-10-CM | POA: Diagnosis not present

## 2017-07-05 DIAGNOSIS — R260 Ataxic gait: Secondary | ICD-10-CM | POA: Diagnosis not present

## 2017-07-05 DIAGNOSIS — M542 Cervicalgia: Secondary | ICD-10-CM | POA: Diagnosis not present

## 2017-07-05 DIAGNOSIS — R262 Difficulty in walking, not elsewhere classified: Secondary | ICD-10-CM | POA: Diagnosis not present

## 2017-07-05 DIAGNOSIS — M25512 Pain in left shoulder: Secondary | ICD-10-CM | POA: Diagnosis not present

## 2017-07-05 DIAGNOSIS — M25511 Pain in right shoulder: Secondary | ICD-10-CM | POA: Diagnosis not present

## 2017-07-05 DIAGNOSIS — M6281 Muscle weakness (generalized): Secondary | ICD-10-CM | POA: Diagnosis not present

## 2017-07-06 DIAGNOSIS — R262 Difficulty in walking, not elsewhere classified: Secondary | ICD-10-CM | POA: Diagnosis not present

## 2017-07-06 DIAGNOSIS — M25511 Pain in right shoulder: Secondary | ICD-10-CM | POA: Diagnosis not present

## 2017-07-06 DIAGNOSIS — M542 Cervicalgia: Secondary | ICD-10-CM | POA: Diagnosis not present

## 2017-07-06 DIAGNOSIS — R260 Ataxic gait: Secondary | ICD-10-CM | POA: Diagnosis not present

## 2017-07-06 DIAGNOSIS — M6281 Muscle weakness (generalized): Secondary | ICD-10-CM | POA: Diagnosis not present

## 2017-07-06 DIAGNOSIS — M25512 Pain in left shoulder: Secondary | ICD-10-CM | POA: Diagnosis not present

## 2017-07-10 DIAGNOSIS — R260 Ataxic gait: Secondary | ICD-10-CM | POA: Diagnosis not present

## 2017-07-10 DIAGNOSIS — M25512 Pain in left shoulder: Secondary | ICD-10-CM | POA: Diagnosis not present

## 2017-07-10 DIAGNOSIS — M25511 Pain in right shoulder: Secondary | ICD-10-CM | POA: Diagnosis not present

## 2017-07-10 DIAGNOSIS — M6281 Muscle weakness (generalized): Secondary | ICD-10-CM | POA: Diagnosis not present

## 2017-07-10 DIAGNOSIS — M542 Cervicalgia: Secondary | ICD-10-CM | POA: Diagnosis not present

## 2017-07-10 DIAGNOSIS — R262 Difficulty in walking, not elsewhere classified: Secondary | ICD-10-CM | POA: Diagnosis not present

## 2017-07-11 DIAGNOSIS — M25512 Pain in left shoulder: Secondary | ICD-10-CM | POA: Diagnosis not present

## 2017-07-11 DIAGNOSIS — M25511 Pain in right shoulder: Secondary | ICD-10-CM | POA: Diagnosis not present

## 2017-07-11 DIAGNOSIS — R260 Ataxic gait: Secondary | ICD-10-CM | POA: Diagnosis not present

## 2017-07-11 DIAGNOSIS — R262 Difficulty in walking, not elsewhere classified: Secondary | ICD-10-CM | POA: Diagnosis not present

## 2017-07-11 DIAGNOSIS — M6281 Muscle weakness (generalized): Secondary | ICD-10-CM | POA: Diagnosis not present

## 2017-07-11 DIAGNOSIS — M542 Cervicalgia: Secondary | ICD-10-CM | POA: Diagnosis not present

## 2017-07-12 ENCOUNTER — Telehealth: Payer: Self-pay | Admitting: Family Medicine

## 2017-07-12 NOTE — Telephone Encounter (Signed)
Copied from [place] [id number]. Topic: Quick Communication - See Telephone Encounter >> Jul 12, 2017  7:10 PM [name] wrote: CRM for notification. See Telephone encounter for: 07/12/17. Patient stated that her preferred pharmacy [facility] on [address] [phone] did not receive her prescription telmisartan (MICARDIS) 40 MG tablet.  Please send again.

## 2017-07-13 DIAGNOSIS — M542 Cervicalgia: Secondary | ICD-10-CM | POA: Diagnosis not present

## 2017-07-13 DIAGNOSIS — R262 Difficulty in walking, not elsewhere classified: Secondary | ICD-10-CM | POA: Diagnosis not present

## 2017-07-13 DIAGNOSIS — M6281 Muscle weakness (generalized): Secondary | ICD-10-CM | POA: Diagnosis not present

## 2017-07-13 DIAGNOSIS — R260 Ataxic gait: Secondary | ICD-10-CM | POA: Diagnosis not present

## 2017-07-13 DIAGNOSIS — M25512 Pain in left shoulder: Secondary | ICD-10-CM | POA: Diagnosis not present

## 2017-07-13 DIAGNOSIS — M25511 Pain in right shoulder: Secondary | ICD-10-CM | POA: Diagnosis not present

## 2017-07-13 MED ORDER — TELMISARTAN 40 MG PO TABS: 40.0000 mg | ORAL_TABLET | Freq: Every day | ORAL | 0 refills | Status: DC

## 2017-07-13 NOTE — Telephone Encounter (Signed)
Prescription for Telmisartan (Micardis) 40 mg tab resent to pharmacy. Original prescription sent on 06/28/17 #30 was not received by the pharmacy.

## 2017-07-17 ENCOUNTER — Telehealth: Payer: Self-pay | Admitting: *Deleted

## 2017-07-17 DIAGNOSIS — R260 Ataxic gait: Secondary | ICD-10-CM | POA: Diagnosis not present

## 2017-07-17 DIAGNOSIS — M25511 Pain in right shoulder: Secondary | ICD-10-CM | POA: Diagnosis not present

## 2017-07-17 DIAGNOSIS — R262 Difficulty in walking, not elsewhere classified: Secondary | ICD-10-CM | POA: Diagnosis not present

## 2017-07-17 DIAGNOSIS — M542 Cervicalgia: Secondary | ICD-10-CM | POA: Diagnosis not present

## 2017-07-17 DIAGNOSIS — M25512 Pain in left shoulder: Secondary | ICD-10-CM | POA: Diagnosis not present

## 2017-07-17 DIAGNOSIS — M6281 Muscle weakness (generalized): Secondary | ICD-10-CM | POA: Diagnosis not present

## 2017-07-17 NOTE — Telephone Encounter (Signed)
Received Physician Orders/OT Plan of Care from [facility]; forwarded to provider/SLS 05/14

## 2017-07-18 DIAGNOSIS — M25511 Pain in right shoulder: Secondary | ICD-10-CM | POA: Diagnosis not present

## 2017-07-18 DIAGNOSIS — M25512 Pain in left shoulder: Secondary | ICD-10-CM | POA: Diagnosis not present

## 2017-07-18 DIAGNOSIS — R260 Ataxic gait: Secondary | ICD-10-CM | POA: Diagnosis not present

## 2017-07-18 DIAGNOSIS — M6281 Muscle weakness (generalized): Secondary | ICD-10-CM | POA: Diagnosis not present

## 2017-07-18 DIAGNOSIS — R262 Difficulty in walking, not elsewhere classified: Secondary | ICD-10-CM | POA: Diagnosis not present

## 2017-07-18 DIAGNOSIS — M542 Cervicalgia: Secondary | ICD-10-CM | POA: Diagnosis not present

## 2017-07-19 DIAGNOSIS — M542 Cervicalgia: Secondary | ICD-10-CM | POA: Diagnosis not present

## 2017-07-19 DIAGNOSIS — M25511 Pain in right shoulder: Secondary | ICD-10-CM | POA: Diagnosis not present

## 2017-07-19 DIAGNOSIS — M6281 Muscle weakness (generalized): Secondary | ICD-10-CM | POA: Diagnosis not present

## 2017-07-19 DIAGNOSIS — R262 Difficulty in walking, not elsewhere classified: Secondary | ICD-10-CM | POA: Diagnosis not present

## 2017-07-19 DIAGNOSIS — M25512 Pain in left shoulder: Secondary | ICD-10-CM | POA: Diagnosis not present

## 2017-07-19 DIAGNOSIS — R260 Ataxic gait: Secondary | ICD-10-CM | POA: Diagnosis not present

## 2017-07-20 DIAGNOSIS — R260 Ataxic gait: Secondary | ICD-10-CM | POA: Diagnosis not present

## 2017-07-20 DIAGNOSIS — M542 Cervicalgia: Secondary | ICD-10-CM | POA: Diagnosis not present

## 2017-07-20 DIAGNOSIS — M6281 Muscle weakness (generalized): Secondary | ICD-10-CM | POA: Diagnosis not present

## 2017-07-20 DIAGNOSIS — M25511 Pain in right shoulder: Secondary | ICD-10-CM | POA: Diagnosis not present

## 2017-07-20 DIAGNOSIS — M25512 Pain in left shoulder: Secondary | ICD-10-CM | POA: Diagnosis not present

## 2017-07-20 DIAGNOSIS — R262 Difficulty in walking, not elsewhere classified: Secondary | ICD-10-CM | POA: Diagnosis not present

## 2017-07-23 DIAGNOSIS — M25511 Pain in right shoulder: Secondary | ICD-10-CM | POA: Diagnosis not present

## 2017-07-23 DIAGNOSIS — M542 Cervicalgia: Secondary | ICD-10-CM | POA: Diagnosis not present

## 2017-07-23 DIAGNOSIS — R262 Difficulty in walking, not elsewhere classified: Secondary | ICD-10-CM | POA: Diagnosis not present

## 2017-07-23 DIAGNOSIS — M25512 Pain in left shoulder: Secondary | ICD-10-CM | POA: Diagnosis not present

## 2017-07-23 DIAGNOSIS — M6281 Muscle weakness (generalized): Secondary | ICD-10-CM | POA: Diagnosis not present

## 2017-07-23 DIAGNOSIS — R260 Ataxic gait: Secondary | ICD-10-CM | POA: Diagnosis not present

## 2017-07-24 ENCOUNTER — Other Ambulatory Visit: Payer: Self-pay

## 2017-07-24 MED ORDER — TELMISARTAN 40 MG PO TABS: 40.0000 mg | ORAL_TABLET | Freq: Every day | ORAL | 1 refills | Status: DC

## 2017-07-27 DIAGNOSIS — M6281 Muscle weakness (generalized): Secondary | ICD-10-CM | POA: Diagnosis not present

## 2017-07-27 DIAGNOSIS — R262 Difficulty in walking, not elsewhere classified: Secondary | ICD-10-CM | POA: Diagnosis not present

## 2017-07-27 DIAGNOSIS — R260 Ataxic gait: Secondary | ICD-10-CM | POA: Diagnosis not present

## 2017-07-27 DIAGNOSIS — M25512 Pain in left shoulder: Secondary | ICD-10-CM | POA: Diagnosis not present

## 2017-07-27 DIAGNOSIS — M25511 Pain in right shoulder: Secondary | ICD-10-CM | POA: Diagnosis not present

## 2017-07-27 DIAGNOSIS — M542 Cervicalgia: Secondary | ICD-10-CM | POA: Diagnosis not present

## 2017-07-30 DIAGNOSIS — M25511 Pain in right shoulder: Secondary | ICD-10-CM | POA: Diagnosis not present

## 2017-07-30 DIAGNOSIS — M6281 Muscle weakness (generalized): Secondary | ICD-10-CM | POA: Diagnosis not present

## 2017-07-30 DIAGNOSIS — R260 Ataxic gait: Secondary | ICD-10-CM | POA: Diagnosis not present

## 2017-07-30 DIAGNOSIS — R262 Difficulty in walking, not elsewhere classified: Secondary | ICD-10-CM | POA: Diagnosis not present

## 2017-07-30 DIAGNOSIS — M542 Cervicalgia: Secondary | ICD-10-CM | POA: Diagnosis not present

## 2017-07-30 DIAGNOSIS — M25512 Pain in left shoulder: Secondary | ICD-10-CM | POA: Diagnosis not present

## 2017-08-01 DIAGNOSIS — M542 Cervicalgia: Secondary | ICD-10-CM | POA: Diagnosis not present

## 2017-08-01 DIAGNOSIS — M6281 Muscle weakness (generalized): Secondary | ICD-10-CM | POA: Diagnosis not present

## 2017-08-01 DIAGNOSIS — M25512 Pain in left shoulder: Secondary | ICD-10-CM | POA: Diagnosis not present

## 2017-08-01 DIAGNOSIS — R260 Ataxic gait: Secondary | ICD-10-CM | POA: Diagnosis not present

## 2017-08-01 DIAGNOSIS — R262 Difficulty in walking, not elsewhere classified: Secondary | ICD-10-CM | POA: Diagnosis not present

## 2017-08-01 DIAGNOSIS — M25511 Pain in right shoulder: Secondary | ICD-10-CM | POA: Diagnosis not present

## 2017-08-03 DIAGNOSIS — M25551 Pain in right hip: Secondary | ICD-10-CM | POA: Diagnosis not present

## 2017-08-03 DIAGNOSIS — M7061 Trochanteric bursitis, right hip: Secondary | ICD-10-CM | POA: Diagnosis not present

## 2017-08-03 DIAGNOSIS — M25552 Pain in left hip: Secondary | ICD-10-CM | POA: Diagnosis not present

## 2017-08-03 DIAGNOSIS — M7062 Trochanteric bursitis, left hip: Secondary | ICD-10-CM | POA: Diagnosis not present

## 2017-08-06 DIAGNOSIS — R262 Difficulty in walking, not elsewhere classified: Secondary | ICD-10-CM | POA: Diagnosis not present

## 2017-08-06 DIAGNOSIS — M25511 Pain in right shoulder: Secondary | ICD-10-CM | POA: Diagnosis not present

## 2017-08-06 DIAGNOSIS — Z9181 History of falling: Secondary | ICD-10-CM | POA: Diagnosis not present

## 2017-08-06 DIAGNOSIS — M25512 Pain in left shoulder: Secondary | ICD-10-CM | POA: Diagnosis not present

## 2017-08-06 DIAGNOSIS — M6281 Muscle weakness (generalized): Secondary | ICD-10-CM | POA: Diagnosis not present

## 2017-08-06 DIAGNOSIS — M542 Cervicalgia: Secondary | ICD-10-CM | POA: Diagnosis not present

## 2017-08-06 DIAGNOSIS — R260 Ataxic gait: Secondary | ICD-10-CM | POA: Diagnosis not present

## 2017-08-07 DIAGNOSIS — M25512 Pain in left shoulder: Secondary | ICD-10-CM | POA: Diagnosis not present

## 2017-08-07 DIAGNOSIS — M25511 Pain in right shoulder: Secondary | ICD-10-CM | POA: Diagnosis not present

## 2017-08-07 DIAGNOSIS — R262 Difficulty in walking, not elsewhere classified: Secondary | ICD-10-CM | POA: Diagnosis not present

## 2017-08-07 DIAGNOSIS — M542 Cervicalgia: Secondary | ICD-10-CM | POA: Diagnosis not present

## 2017-08-07 DIAGNOSIS — R260 Ataxic gait: Secondary | ICD-10-CM | POA: Diagnosis not present

## 2017-08-07 DIAGNOSIS — M6281 Muscle weakness (generalized): Secondary | ICD-10-CM | POA: Diagnosis not present

## 2017-08-08 DIAGNOSIS — M6281 Muscle weakness (generalized): Secondary | ICD-10-CM | POA: Diagnosis not present

## 2017-08-08 DIAGNOSIS — R260 Ataxic gait: Secondary | ICD-10-CM | POA: Diagnosis not present

## 2017-08-08 DIAGNOSIS — M25512 Pain in left shoulder: Secondary | ICD-10-CM | POA: Diagnosis not present

## 2017-08-08 DIAGNOSIS — R262 Difficulty in walking, not elsewhere classified: Secondary | ICD-10-CM | POA: Diagnosis not present

## 2017-08-08 DIAGNOSIS — M25511 Pain in right shoulder: Secondary | ICD-10-CM | POA: Diagnosis not present

## 2017-08-08 DIAGNOSIS — M542 Cervicalgia: Secondary | ICD-10-CM | POA: Diagnosis not present

## 2017-08-09 ENCOUNTER — Encounter: Payer: Self-pay | Admitting: Family Medicine

## 2017-08-10 DIAGNOSIS — R260 Ataxic gait: Secondary | ICD-10-CM | POA: Diagnosis not present

## 2017-08-10 DIAGNOSIS — M542 Cervicalgia: Secondary | ICD-10-CM | POA: Diagnosis not present

## 2017-08-10 DIAGNOSIS — M25511 Pain in right shoulder: Secondary | ICD-10-CM | POA: Diagnosis not present

## 2017-08-10 DIAGNOSIS — M6281 Muscle weakness (generalized): Secondary | ICD-10-CM | POA: Diagnosis not present

## 2017-08-10 DIAGNOSIS — R262 Difficulty in walking, not elsewhere classified: Secondary | ICD-10-CM | POA: Diagnosis not present

## 2017-08-10 DIAGNOSIS — M25512 Pain in left shoulder: Secondary | ICD-10-CM | POA: Diagnosis not present

## 2017-08-10 MED ORDER — SYNTHROID 150 MCG PO TABS: 150.0000 ug | ORAL_TABLET | Freq: Every day | ORAL | 1 refills | Status: DC

## 2017-08-14 ENCOUNTER — Other Ambulatory Visit (INDEPENDENT_AMBULATORY_CARE_PROVIDER_SITE_OTHER): Payer: Medicare Other

## 2017-08-14 ENCOUNTER — Ambulatory Visit (INDEPENDENT_AMBULATORY_CARE_PROVIDER_SITE_OTHER): Payer: Medicare Other

## 2017-08-14 DIAGNOSIS — R300 Dysuria: Secondary | ICD-10-CM

## 2017-08-14 DIAGNOSIS — Z7901 Long term (current) use of anticoagulants: Secondary | ICD-10-CM

## 2017-08-14 LAB — POCT INR: INR: 1.7 — AB (ref 2.0–3.0)

## 2017-08-14 LAB — VITAMIN D 25 HYDROXY (VIT D DEFICIENCY, FRACTURES): VITD: 44.14 ng/mL (ref 30.00–100.00)

## 2017-08-14 NOTE — Progress Notes (Signed)
Pre visit review using our clinic tool,if applicable. No additional management support is needed unless otherwise documented below in the visit note.   Pt here for INR check   Goal INR = 2.0-3.0  Last INR = 2.23 on 07/03/17  Pt currently takes Coumadin 4 mg daily  Patient states she no longer has INR checked at home. Will return to this office to have checks.  Pt denies recent antibiotics, no dietary changes and no unusual bruising / bleeding.No upcoming procedures per patient.  INR today = 1.7  Pt advised per Dr. Charlett Blake DOD patient to continue same dosage of Coumadin and return in 2 weeks.  Patient agreed appointment scheduled.

## 2017-08-14 NOTE — Addendum Note (Signed)
Addended by: Harl Bowie on: 08/14/2017 03:41 PM   Modules accepted: Orders

## 2017-08-15 LAB — PTH, INTACT AND CALCIUM
CALCIUM: 9.6 mg/dL (ref 8.6–10.4)
PTH: 78 pg/mL — AB (ref 14–64)

## 2017-08-15 LAB — URINALYSIS
BILIRUBIN URINE: NEGATIVE
KETONES UR: NEGATIVE
LEUKOCYTES UA: NEGATIVE
Nitrite: NEGATIVE
PH: 6.5 (ref 5.0–8.0)
Specific Gravity, Urine: 1.02 (ref 1.000–1.030)
Total Protein, Urine: NEGATIVE
URINE GLUCOSE: NEGATIVE
Urobilinogen, UA: 0.2 (ref 0.0–1.0)

## 2017-08-16 DIAGNOSIS — M6281 Muscle weakness (generalized): Secondary | ICD-10-CM | POA: Diagnosis not present

## 2017-08-16 DIAGNOSIS — R260 Ataxic gait: Secondary | ICD-10-CM | POA: Diagnosis not present

## 2017-08-16 DIAGNOSIS — M542 Cervicalgia: Secondary | ICD-10-CM | POA: Diagnosis not present

## 2017-08-16 DIAGNOSIS — M25511 Pain in right shoulder: Secondary | ICD-10-CM | POA: Diagnosis not present

## 2017-08-16 DIAGNOSIS — R262 Difficulty in walking, not elsewhere classified: Secondary | ICD-10-CM | POA: Diagnosis not present

## 2017-08-16 DIAGNOSIS — M25512 Pain in left shoulder: Secondary | ICD-10-CM | POA: Diagnosis not present

## 2017-08-16 LAB — URINE CULTURE
MICRO NUMBER:: 90699485
SPECIMEN QUALITY: ADEQUATE

## 2017-08-17 ENCOUNTER — Telehealth: Payer: Self-pay

## 2017-08-17 ENCOUNTER — Encounter: Payer: Self-pay | Admitting: Family Medicine

## 2017-08-17 DIAGNOSIS — E213 Hyperparathyroidism, unspecified: Secondary | ICD-10-CM

## 2017-08-17 DIAGNOSIS — N3000 Acute cystitis without hematuria: Secondary | ICD-10-CM

## 2017-08-17 DIAGNOSIS — R262 Difficulty in walking, not elsewhere classified: Secondary | ICD-10-CM | POA: Diagnosis not present

## 2017-08-17 DIAGNOSIS — M25511 Pain in right shoulder: Secondary | ICD-10-CM | POA: Diagnosis not present

## 2017-08-17 DIAGNOSIS — M542 Cervicalgia: Secondary | ICD-10-CM | POA: Diagnosis not present

## 2017-08-17 DIAGNOSIS — R260 Ataxic gait: Secondary | ICD-10-CM | POA: Diagnosis not present

## 2017-08-17 DIAGNOSIS — M6281 Muscle weakness (generalized): Secondary | ICD-10-CM | POA: Diagnosis not present

## 2017-08-17 DIAGNOSIS — M25512 Pain in left shoulder: Secondary | ICD-10-CM | POA: Diagnosis not present

## 2017-08-17 MED ORDER — FOSFOMYCIN TROMETHAMINE 3 G PO PACK: 3.0000 g | PACK | Freq: Once | ORAL | 0 refills | Status: DC

## 2017-08-17 MED ORDER — FOSFOMYCIN TROMETHAMINE 3 G PO PACK: 3.0000 g | PACK | Freq: Once | ORAL | 0 refills | Status: AC

## 2017-08-17 MED FILL — MONUROL 3 GM SACHET: 3 | 1 days supply | Qty: 1 | Fill #0

## 2017-08-17 NOTE — Telephone Encounter (Signed)
PA approved. Effective 08/17/2017 to 08/18/2018.

## 2017-08-17 NOTE — Telephone Encounter (Signed)
I had thought that Augmentin was called in but this does not appear to be the case Allergic to macrobid  Will treat with monurol which I was able to find [facility]

## 2017-08-17 NOTE — Telephone Encounter (Signed)
PA initiated via Covermymeds; KEY: [id number]. Awaiting determination.

## 2017-08-17 NOTE — Telephone Encounter (Signed)
Rx re-sent in to different pharmacy per pt. Preference.

## 2017-08-18 NOTE — Telephone Encounter (Signed)
Called and spoke with pt.  She got the fosfomycin for UTI and is feeling fine.  Went over her labs- vit D is ok However it does look like she has hyperparathyroidism.  Will refer to endocrinology - she states understanding and agreement with plan  Results for orders placed or performed in visit on 08/14/17  Urine Culture  Result Value Ref Range   MICRO NUMBER: [id number]    SPECIMEN QUALITY: ADEQUATE    Sample Source URINE    STATUS: FINAL    ISOLATE 1: ESBL Escherichia coli (A)       Susceptibility   Esbl escherichia coli - URINE CULTURE, REFLEX    AMOX/CLAVULANIC 16 Intermediate     AMPICILLIN* >=32 Resistant      * Extended spectrum beta-lactamase (ESBL) producingorganisms demonstrate decreased activity withpenicillins, cephalosporins and aztreonam.    AMPICILLIN/SULBACTAM >=32 Resistant     CEFAZOLIN* >=64 Resistant      * Extended spectrum beta-lactamase (ESBL) producingorganisms demonstrate decreased activity withpenicillins, cephalosporins and aztreonam.For uncomplicated UTI caused by E. coli,K. pneumoniae or P. mirabilis: Cefazolin issusceptible if MIC <32 mcg/mL and predictssusceptible to the oral agents cefaclor, cefdinir,cefpodoxime, cefprozil, cefuroxime, cephalexinand loracarbef.    CEFEPIME  Resistant     CEFTRIAXONE >=64 Resistant     CIPROFLOXACIN >=4 Resistant     LEVOFLOXACIN >=8 Resistant     ERTAPENEM <=0.5 Sensitive     GENTAMICIN <=1 Sensitive     IMIPENEM <=0.25 Sensitive     NITROFURANTOIN <=16 Sensitive     PIP/TAZO <=4 Sensitive     TOBRAMYCIN <=1 Sensitive     TRIMETH/SULFA* >=320 Resistant      * Extended spectrum beta-lactamase (ESBL) producingorganisms demonstrate decreased activity withpenicillins, cephalosporins and aztreonam.For uncomplicated UTI caused by E. coli,K. pneumoniae or P. mirabilis: Cefazolin issusceptible if MIC <32 mcg/mL and predictssusceptible to the oral agents cefaclor, cefdinir,cefpodoxime, cefprozil, cefuroxime, cephalexinand  loracarbef.Legend:S = Susceptible  I = IntermediateR = Resistant  NS = Not susceptible* = Not tested  NR = Not reported**NN = See antimicrobic comments  Vitamin D (25 hydroxy)  Result Value Ref Range   VITD 44.14 30.00 - 100.00 ng/mL  PTH, Intact and Calcium  Result Value Ref Range   PTH 78 (H) 14 - 64 pg/mL   Calcium 9.6 8.6 - 10.4 mg/dL  Urinalysis  Result Value Ref Range   Color, Urine YELLOW Yellow;Lt. Yellow   APPearance CLEAR Clear   Specific Gravity, Urine 1.020 1.000 - 1.030   pH 6.5 5.0 - 8.0   Total Protein, Urine NEGATIVE Negative   Urine Glucose NEGATIVE Negative   Ketones, ur NEGATIVE Negative   Bilirubin Urine NEGATIVE Negative   Hgb urine dipstick TRACE-INTACT (A) Negative   Urobilinogen, UA 0.2 0.0 - 1.0   Leukocytes, UA NEGATIVE Negative   Nitrite NEGATIVE Negative

## 2017-08-20 ENCOUNTER — Encounter: Payer: Self-pay | Admitting: Endocrinology

## 2017-08-21 MED ORDER — AMOXICILLIN-POT CLAVULANATE 875-125 MG PO TABS: 1.0000 | ORAL_TABLET | Freq: Two times a day (BID) | ORAL | 0 refills | Status: DC

## 2017-08-21 NOTE — Addendum Note (Signed)
Addended by: Magdalene Molly A on: 08/21/2017 05:29 PM   Modules accepted: Orders

## 2017-08-22 DIAGNOSIS — M25511 Pain in right shoulder: Secondary | ICD-10-CM | POA: Diagnosis not present

## 2017-08-22 DIAGNOSIS — R260 Ataxic gait: Secondary | ICD-10-CM | POA: Diagnosis not present

## 2017-08-22 DIAGNOSIS — M25512 Pain in left shoulder: Secondary | ICD-10-CM | POA: Diagnosis not present

## 2017-08-22 DIAGNOSIS — M542 Cervicalgia: Secondary | ICD-10-CM | POA: Diagnosis not present

## 2017-08-22 DIAGNOSIS — M6281 Muscle weakness (generalized): Secondary | ICD-10-CM | POA: Diagnosis not present

## 2017-08-22 DIAGNOSIS — R262 Difficulty in walking, not elsewhere classified: Secondary | ICD-10-CM | POA: Diagnosis not present

## 2017-08-24 DIAGNOSIS — R262 Difficulty in walking, not elsewhere classified: Secondary | ICD-10-CM | POA: Diagnosis not present

## 2017-08-24 DIAGNOSIS — M6281 Muscle weakness (generalized): Secondary | ICD-10-CM | POA: Diagnosis not present

## 2017-08-24 DIAGNOSIS — M25511 Pain in right shoulder: Secondary | ICD-10-CM | POA: Diagnosis not present

## 2017-08-24 DIAGNOSIS — R260 Ataxic gait: Secondary | ICD-10-CM | POA: Diagnosis not present

## 2017-08-24 DIAGNOSIS — M542 Cervicalgia: Secondary | ICD-10-CM | POA: Diagnosis not present

## 2017-08-24 DIAGNOSIS — M25512 Pain in left shoulder: Secondary | ICD-10-CM | POA: Diagnosis not present

## 2017-08-28 ENCOUNTER — Ambulatory Visit (INDEPENDENT_AMBULATORY_CARE_PROVIDER_SITE_OTHER): Payer: Medicare Other

## 2017-08-28 DIAGNOSIS — Z7901 Long term (current) use of anticoagulants: Secondary | ICD-10-CM | POA: Diagnosis not present

## 2017-08-28 LAB — POCT INR: INR: 2.2 (ref 2.0–3.0)

## 2017-08-28 NOTE — Progress Notes (Signed)
Pre visit review using our clinic tool,if applicable. No additional management support is needed unless otherwise documented below in the visit note.   Pt here for INR check per order from Lamar Blinks M.D.  Goal INR = 2.0-3.0  Last INR = 1.7  Pt currently takes Coumadin 4 mg daily  Pt denies recent antibiotics, no dietary changes and no unusual bruising / bleeding.  INR today = 2.2  Pt advised per Dr. Charlett Blake DOD, to continue taking Coumadin 4 mg daily and return for INR check in 1 month, patient agreed Appointment scheduled.

## 2017-08-29 DIAGNOSIS — R260 Ataxic gait: Secondary | ICD-10-CM | POA: Diagnosis not present

## 2017-08-29 DIAGNOSIS — M6281 Muscle weakness (generalized): Secondary | ICD-10-CM | POA: Diagnosis not present

## 2017-08-29 DIAGNOSIS — R262 Difficulty in walking, not elsewhere classified: Secondary | ICD-10-CM | POA: Diagnosis not present

## 2017-08-29 DIAGNOSIS — M542 Cervicalgia: Secondary | ICD-10-CM | POA: Diagnosis not present

## 2017-08-29 DIAGNOSIS — M25511 Pain in right shoulder: Secondary | ICD-10-CM | POA: Diagnosis not present

## 2017-08-29 DIAGNOSIS — M25512 Pain in left shoulder: Secondary | ICD-10-CM | POA: Diagnosis not present

## 2017-08-31 DIAGNOSIS — M6281 Muscle weakness (generalized): Secondary | ICD-10-CM | POA: Diagnosis not present

## 2017-08-31 DIAGNOSIS — R262 Difficulty in walking, not elsewhere classified: Secondary | ICD-10-CM | POA: Diagnosis not present

## 2017-08-31 DIAGNOSIS — M25511 Pain in right shoulder: Secondary | ICD-10-CM | POA: Diagnosis not present

## 2017-08-31 DIAGNOSIS — R260 Ataxic gait: Secondary | ICD-10-CM | POA: Diagnosis not present

## 2017-08-31 DIAGNOSIS — M25512 Pain in left shoulder: Secondary | ICD-10-CM | POA: Diagnosis not present

## 2017-08-31 DIAGNOSIS — M542 Cervicalgia: Secondary | ICD-10-CM | POA: Diagnosis not present

## 2017-09-07 ENCOUNTER — Telehealth: Payer: Self-pay | Admitting: Family Medicine

## 2017-09-07 DIAGNOSIS — R262 Difficulty in walking, not elsewhere classified: Secondary | ICD-10-CM | POA: Diagnosis not present

## 2017-09-07 DIAGNOSIS — M542 Cervicalgia: Secondary | ICD-10-CM | POA: Diagnosis not present

## 2017-09-07 DIAGNOSIS — R260 Ataxic gait: Secondary | ICD-10-CM | POA: Diagnosis not present

## 2017-09-07 DIAGNOSIS — G20 Parkinson's disease: Secondary | ICD-10-CM | POA: Diagnosis not present

## 2017-09-07 DIAGNOSIS — M6281 Muscle weakness (generalized): Secondary | ICD-10-CM | POA: Diagnosis not present

## 2017-09-07 DIAGNOSIS — M25511 Pain in right shoulder: Secondary | ICD-10-CM | POA: Diagnosis not present

## 2017-09-07 DIAGNOSIS — M25512 Pain in left shoulder: Secondary | ICD-10-CM | POA: Diagnosis not present

## 2017-09-07 NOTE — Telephone Encounter (Signed)
Copied from [place] [id number]. Topic: Quick Communication - Rx Refill/Question >> Sep 07, 2017  4:31 PM Tye [place] wrote: Medication: SYNTHROID 150 MCG tablet [[id number]]   Pharmacy called b/c the pt has been on the Rx above but the new Rx that was sent over today was for the Brand name and not Generic and the pharmacy wants to be sure before filling

## 2017-09-10 DIAGNOSIS — M542 Cervicalgia: Secondary | ICD-10-CM | POA: Diagnosis not present

## 2017-09-10 DIAGNOSIS — M6281 Muscle weakness (generalized): Secondary | ICD-10-CM | POA: Diagnosis not present

## 2017-09-10 DIAGNOSIS — M25512 Pain in left shoulder: Secondary | ICD-10-CM | POA: Diagnosis not present

## 2017-09-10 DIAGNOSIS — R262 Difficulty in walking, not elsewhere classified: Secondary | ICD-10-CM | POA: Diagnosis not present

## 2017-09-10 DIAGNOSIS — M25511 Pain in right shoulder: Secondary | ICD-10-CM | POA: Diagnosis not present

## 2017-09-10 DIAGNOSIS — R260 Ataxic gait: Secondary | ICD-10-CM | POA: Diagnosis not present

## 2017-09-10 NOTE — Telephone Encounter (Signed)
Please review and notify pharmacy if Synthroid brand is recommended.

## 2017-09-10 NOTE — Telephone Encounter (Signed)
Pharmacy calling again, request call back [phone].

## 2017-09-11 DIAGNOSIS — M25512 Pain in left shoulder: Secondary | ICD-10-CM | POA: Diagnosis not present

## 2017-09-11 DIAGNOSIS — M25511 Pain in right shoulder: Secondary | ICD-10-CM | POA: Diagnosis not present

## 2017-09-11 DIAGNOSIS — M542 Cervicalgia: Secondary | ICD-10-CM | POA: Diagnosis not present

## 2017-09-11 DIAGNOSIS — R262 Difficulty in walking, not elsewhere classified: Secondary | ICD-10-CM | POA: Diagnosis not present

## 2017-09-11 DIAGNOSIS — M6281 Muscle weakness (generalized): Secondary | ICD-10-CM | POA: Diagnosis not present

## 2017-09-11 DIAGNOSIS — R260 Ataxic gait: Secondary | ICD-10-CM | POA: Diagnosis not present

## 2017-09-11 NOTE — Telephone Encounter (Signed)
Called pharmacy back, all is taken care of

## 2017-09-14 DIAGNOSIS — M25511 Pain in right shoulder: Secondary | ICD-10-CM | POA: Diagnosis not present

## 2017-09-14 DIAGNOSIS — R262 Difficulty in walking, not elsewhere classified: Secondary | ICD-10-CM | POA: Diagnosis not present

## 2017-09-14 DIAGNOSIS — M25512 Pain in left shoulder: Secondary | ICD-10-CM | POA: Diagnosis not present

## 2017-09-14 DIAGNOSIS — R260 Ataxic gait: Secondary | ICD-10-CM | POA: Diagnosis not present

## 2017-09-14 DIAGNOSIS — M542 Cervicalgia: Secondary | ICD-10-CM | POA: Diagnosis not present

## 2017-09-14 DIAGNOSIS — M6281 Muscle weakness (generalized): Secondary | ICD-10-CM | POA: Diagnosis not present

## 2017-09-17 ENCOUNTER — Other Ambulatory Visit: Payer: Self-pay | Admitting: Family Medicine

## 2017-09-17 DIAGNOSIS — M6281 Muscle weakness (generalized): Secondary | ICD-10-CM | POA: Diagnosis not present

## 2017-09-17 DIAGNOSIS — M25511 Pain in right shoulder: Secondary | ICD-10-CM | POA: Diagnosis not present

## 2017-09-17 DIAGNOSIS — R260 Ataxic gait: Secondary | ICD-10-CM | POA: Diagnosis not present

## 2017-09-17 DIAGNOSIS — M542 Cervicalgia: Secondary | ICD-10-CM | POA: Diagnosis not present

## 2017-09-17 DIAGNOSIS — R262 Difficulty in walking, not elsewhere classified: Secondary | ICD-10-CM | POA: Diagnosis not present

## 2017-09-17 DIAGNOSIS — M25512 Pain in left shoulder: Secondary | ICD-10-CM | POA: Diagnosis not present

## 2017-09-17 NOTE — Telephone Encounter (Signed)
Requesting:Ambien Contract:none, needs csc QDI:YMEB, needs uds Last Visit:05/28/17 Next Visit:09/27/17 Last Refill:05/28/17 1 refill  Please Advise

## 2017-09-18 ENCOUNTER — Telehealth: Payer: Self-pay | Admitting: *Deleted

## 2017-09-18 NOTE — Telephone Encounter (Signed)
Received Physician Orders from [facility] [x2]; forwarded to provider/SLS 07/16

## 2017-09-25 DIAGNOSIS — M25512 Pain in left shoulder: Secondary | ICD-10-CM | POA: Diagnosis not present

## 2017-09-25 DIAGNOSIS — R262 Difficulty in walking, not elsewhere classified: Secondary | ICD-10-CM | POA: Diagnosis not present

## 2017-09-25 DIAGNOSIS — R260 Ataxic gait: Secondary | ICD-10-CM | POA: Diagnosis not present

## 2017-09-25 DIAGNOSIS — M25511 Pain in right shoulder: Secondary | ICD-10-CM | POA: Diagnosis not present

## 2017-09-25 DIAGNOSIS — M6281 Muscle weakness (generalized): Secondary | ICD-10-CM | POA: Diagnosis not present

## 2017-09-25 DIAGNOSIS — M542 Cervicalgia: Secondary | ICD-10-CM | POA: Diagnosis not present

## 2017-09-25 NOTE — Progress Notes (Signed)
[name] [name] was seen today in the movement disorders clinic for neurologic consultation at the request of [name], [name], MD.   This patient is accompanied in the office by her child who supplements the history.   Prior records made available to me were reviewed.  The patient first saw Dr. Leta [name] in March, 2016 at which point she was complaining about symptoms of diplopia and weakness.  Because of the diplopia she had acetylcholine receptor antibodies that were performed that were negative.  She has had an MRI and MRA of the brain with and without gadolinium.  I reviewed these images.  These were done on 05/21/2014.  This just revealed mild small vessel disease.  She also had a carotid ultrasound with him in March, 2016 revealed less than 50% stenosis bilaterally.  She followed up with him on 07/23/2014 and started on carbidopa/levodopa 25/100, one tablet 3 times per day (8am, 12pm, 5pm, before the meals).  She called back to him and complained about diffuse psain, headaches, depression and nausea.  Although he was not completely convinced that some of these things were not associated with her fibromyalgia and situational depression (her grandson was leaving for college and he serves as a caregiver), it was decided to taper off of her levodopa on 09/16/2014.   She did this and is now off the medication.  She states today that she initially felt great on the medication but then felt that it caused side effects.  Now that she is off of it, however, she c/o difficulty with walking. They also discussed referrals to pain management and psychiatry.  She presents today for a second opinion.  Pt states that diplopia was her first sx and then she began to notice falling.  This started in 04/2013.  The diplopia was horizontal in nature and she initially thought that it was due to lyrica.  The lyrica was d/c and it initally seemed to get better but then it came back.  It gets better if she closes an eye  (doesn't know if it matters which eye she closes).  She has some degree of diplopia daily but mostly at night.  She sees Dr. Arnoldo [name] at [facility] and was told that nothing was wrong with the eyes.    10/29/14 update:  The patient is following up today.  Overall, she states that she is feeling well.  She really hasn't had significant diplopia since our last visit.  She had her DaT scan on 10/14/2014 and while tracer uptake was present in the bilateral attainment, it was decreased on the right putamen relative to the right caudate, which can be seen in early parkinsonian syndromes.  The patient admits that she is still very slow.  She saw a different ophthalmologist [facility] imaging since our last visit and states that the diagnosis of glaucoma was retracted and everything looked well.  She denies any falls since our last visit no hallucinations.  12/10/14 update:  The patient is accompanied by her daughter who supplements the history.  I started the patient on carbidopa/levodopa 25/100 tid last visit.  Today, she states that she thinks that the medication makes her fibromyalgia worse but admits that it could be the weather.  Admits to a fall yesterday.   She was going into the house and there is an odd shaped stair (L shaped landing) and she fell backwards.   She called 911 and she went to [facility] and she had a CT brain and she states  that it was negative.  Had her INR checked and it was 2.6.  Does state that she was vomiting on way on the way to the hospital so they kept her for a few hours.  Still nauseated some today but has been nauseated even prior to that and wonders if the medication contributes.  She did not take any carbidopa/levodopa today.   03/09/14 update:  The patient follows up today.  Last visit, she complained about multiple side effects of levodopa including the fact that she thought that it made her fibromyalgia worse.  Although I told her that I was not convinced that it caused many of these  side effects, we ultimately decided to try rytary instead.  She started with 95 mg 3 times a day and worked up to 145 mg 3 times a day.  She called me on October 20 to state that she was doing well and then called back on November 11 to state that the medication was causing dizziness and diplopia.  She wanted to decrease the medication.  We explained that sometimes the disease can cause the symptoms and so I had her hold the medication for a few days and she stated that the dizziness and diplopia resolved.  She asked again to go back down to the 95 mg 3 times a day, which we ultimately did.  She feels well going to PT.  She is not exercising otherwise.  She asks me for a referral to pain management for her fibromyalgia.  She has not had falls.  No hallucinations.    07/13/15 update:  The patient follows up today.  This patient is accompanied in the office by her daughter who supplements the history.  She is on Rytary, 95 mg 3 times per day.  She doesn't think that it wears off.  It is very expensive but she didn't qualify for the [facility] program.  She did physical therapy since our last visit.  She was referred to pain management with Dr. Letta Pate.  I reviewed his records and appreciate the input.  She was started on Cymbalta and she states that she is doing well with that and ultram.  She felt great yesterday but doesn't today and attributes that to the rain.  No hallucinations.  Still with diplopia.  Daughter asks about referral to neuro-ophth.  10/21/15 update:  The patient follows up today, unaccompanied today.  She remains on Rytary, 95 mg 3 times per day.  Last visit, she wanted a referral to neuro-ophthalmology for her diplopia.  Her appt is next month.  She has not yet had that appt that she did call me and stated that she wanted to go off of the rytary and back on the older version of levodopa because she thought that the rytary was causing double vision.  We had explained to her that this just did not  make physiologic sense because they all essentially have the same ingredient.  Therefore, she is still on the rytary. She has to cover an eye (either) to see well.   Had a fall from bathroom into bedroom.  She bruised her arms.  One other fall that was very similar.  Has been "on an off" with physical therapy.  Has a stationary bike and rides that 3 times a week in the house.    01/25/16 update:  The patient follows up today.   She is accompanied by her daughter who supplement the history.   Much has happened since our last visit  and I reviewed her records.  She saw Dr. Hassell Done on 11/22/2015.  He repeated an acetylcholine receptor antibody and obtain a CT of the orbits. He sent the patient to see Jerre Simon, orthoptist, to see if prism would be of help with regard to her ocular motility disorder.  She had a carotid ultrasound on 10/28/2015 that demonstrated 1-39% stenosis bilaterally.  She was admitted to the hospital from October 13 to October 16 and then went to inpatient rehabilitation from October 16 to October 27.  She is now in subacute rehab.  She got up in the middle of the night to use the bathroom and lost her balance and fell through the sheet rock.  She sustained a moderately displaced fracture is seen involving the posterior spinous process of C5.  Daughter thinks more confused since fall.  Having trouble getting into phone so had to take passcode off of phone. Trouble paying bills.  Goal is to go back home independently but family doesn't want that.  Family would like assisted living with monitored meds.  Daughter doesn't think that meds were being taken correctly.  CT brain has been negative for bleed.  Saw Dr. Beverly Gust in hospital for brief neuropsych exam and she recommended complete neuropsych testing in 3 months.  Still on Rytary 95 mg tid.    07/12/16 update:  Patient seen today in follow-up.  She is accompanied by her daughter who supplements the history.  I have reviewed records since our last  visit.  She was in the hospital in February with Escherichia coli sepsis secondary to UTI.  She was discharged to [facility] for rehabilitation following that.  She was not there for long and then she went back to [place] where she lived. She has subsequently moved to [place].  She moved there last month.  She lives independently.  She does own pills but daughter prepares pill box.  Meals are prepared.  She does own showers.  She just finished PT.  Daughter states only 1 fall and that was when she had that UTI and she was confused.  She got tangled in her gown and then fell.   Remains on Rytary, and is only on 95 mg 3 times per day.  She was supposed to have repeat neuropsych testing, but canceled that.  It was scheduled for 04/05/2016.  05/18/17 update: Patient is seen today in follow-up.  I have reviewed numerous records are not available to me.  Patient was admitted to the hospital in December for mental status change, which was ultimately found to be due to sepsis secondary to UTI.  She went to [facility] [place] rehab following that. She lives at [place] and lives in independent living.  Just meals are done for her.  She is leaving there on 06/13/16.   Patient has been following up with her primary care physician.  I have reviewed those records and also communicated with her primary care physician.  The patient called [name] to let us know that Rytary was not affordable and wanted to change back to carbidopa/levodopa 25/100.  I really did not want to change back to this, as the patient reported in the past that she did not tolerate it (that it made her fibromyalgia worse).  Ultimately, we just gave her samples of Rytary, 145 mg 3 times per day (prior 95 mg 3 times per day).  No side effects.  She still wants to go back to immediate release carbidopa/levodopa.  "couple" falls getting out of the bed because  she would slip on the carpet.  She has started using socks with rubber bottoms.  Is doing PT 7 days a week and  thinks that it is helpful.  Using TENs unit on knees and that is helpful.  Had neurocognitive testing in July, 2018.  This was reviewed.  This demonstrated mild cognitive impairment and mild anxiety.  Recommendations were for her to stay in assisted living  10/09/17 update:  Pt seen in f/u for PD.  This patient is accompanied in the office by her child who supplements the history.  She wanted to change last visit from rytary back to carbidopa/levodopa 25/100 IR, 1 po tid which we did.  She was in the ED on 07/03/17 after a fall.  The records that were made available to me were reviewed.  She was going into her bathroom when it happened.  She hit her head.  CT done in the ER was negative.  She reports that she had 5 falls that week.  She reports that she is in PT at independent living.  She reports that they graduated her but then she started back.  She stopped a few weeks ago after fell and hit tailbone.  Had a recent UTI and usually notes falls with UTI.  Also noted that when she was using the Rollator falls were worse.  She is back to using the traditional walker.  In addition, the fall that happened in the bathroom was because of not having a raised toilet seat.  She has one at her home but not where she lives at [place].  Her son was supposed to bring one to her.     PREVIOUS MEDICATIONS: Sinemet; rytary  ALLERGIES:   Allergies  Allergen Reactions  . Crestor [Rosuvastatin Calcium] Other (See Comments)    Feeling very bad, aching all over.  . Erythromycin Hives  . Gabapentin Other (See Comments)    Blurred vision  . Pregabalin Other (See Comments)    Crossed vision.  . Carbidopa Other (See Comments)    Headaches, nausea, dizziness  . Macrobid [Nitrofurantoin] Hives  . Losartan Itching    CURRENT MEDICATIONS:  Outpatient Encounter Medications as of 10/09/2017  Medication Sig  . acetaminophen (TYLENOL) 500 MG tablet Take 500 mg by mouth every 6 (six) hours as needed for mild pain.  Marland Kitchen  atorvastatin (LIPITOR) 10 MG tablet Take 1 tablet (10 mg total) by mouth daily.  . carbidopa-levodopa (SINEMET IR) 25-100 MG tablet Take 1 tablet by mouth 3 (three) times daily.  . Cholecalciferol (VITAMIN D3) 5000 UNITS CAPS Take 5,000 Units by mouth daily.   . cyanocobalamin 500 MCG tablet Take 500 mcg by mouth daily.  Marland Kitchen ketoconazole (NIZORAL) 2 % shampoo Apply 1 application topically 2 (two) times a week.  . magnesium oxide (MAG-OX) 400 (241.3 Mg) MG tablet Take 0.5 tablets (200 mg total) by mouth daily.  . Melatonin 5 MG TABS Take 1 tablet by mouth at bedtime as needed (for sleep).  . Menthol, Topical Analgesic, (BIOFREEZE EX) Apply 1 application topically daily as needed (for pain).  . nicotine polacrilex (NICORETTE) 2 MG gum Take 2 mg by mouth as needed for smoking cessation.  Marland Kitchen SYNTHROID 150 MCG tablet Take 1 tablet (150 mcg total) by mouth daily before breakfast.  . telmisartan (MICARDIS) 40 MG tablet Take 1 tablet (40 mg total) by mouth daily.  Marland Kitchen venlafaxine XR (EFFEXOR-XR) 150 MG 24 hr capsule Take 1 capsule (150 mg total) by mouth daily with breakfast.  .  warfarin (COUMADIN) 4 MG tablet Take 1 tablet (4 mg total) by mouth See admin instructions. Adjust as directed by MD (Patient taking differently: Take 4 mg by mouth daily. Adjust as directed by MD)  . zolpidem (AMBIEN) 5 MG tablet Take 1 tablet (5 mg total) by mouth at bedtime as needed for sleep. Pt requests brand only. Do not combine with any other sedating substance  . [DISCONTINUED] albuterol (PROVENTIL HFA;VENTOLIN HFA) 108 (90 Base) MCG/ACT inhaler Inhale 2 puffs into the lungs every 6 (six) hours as needed for wheezing or shortness of breath.  . [DISCONTINUED] amoxicillin-clavulanate (AUGMENTIN) 875-125 MG tablet Take 1 tablet by mouth 2 (two) times daily.  . [DISCONTINUED] celecoxib (CELEBREX) 100 MG capsule Take 1 capsule (100 mg total) by mouth 2 (two) times daily.  . [DISCONTINUED] cetirizine (ZYRTEC) 10 MG tablet Take 1  tablet (10 mg total) by mouth daily.  . [DISCONTINUED] cycloSPORINE (RESTASIS) 0.05 % ophthalmic emulsion Place 1 drop into both eyes 2 (two) times daily.  . [DISCONTINUED] fluticasone (FLONASE) 50 MCG/ACT nasal spray Place 1 spray into both nostrils daily.  . [DISCONTINUED] lidocaine (LIDODERM) 5 % Place 1 patch onto the skin daily. Remove & Discard patch within 12 hours or as directed by MD  . [DISCONTINUED] Omega-3 Fatty Acids (FISH OIL PO) Take 1 capsule by mouth daily.  . [DISCONTINUED] ondansetron (ZOFRAN ODT) 4 MG disintegrating tablet Take 1 tablet (4 mg total) by mouth every 8 (eight) hours as needed for nausea or vomiting.  . [DISCONTINUED] Oxycodone HCl 10 MG TABS Take 1/2 tablet daily as needed for neck pain. If pain persists may take another 1/2 tablet per day  . [DISCONTINUED] tamsulosin (FLOMAX) 0.4 MG CAPS capsule Take 1 capsule (0.4 mg total) by mouth daily for 14 days.  . [DISCONTINUED] telmisartan-hydrochlorothiazide (MICARDIS HCT) 40-12.5 MG tablet Take 1 tablet by mouth daily.  . [DISCONTINUED] zolpidem (AMBIEN) 5 MG tablet TAKE 1 TABLET BY MOUTH ONCE DAILY AT BEDTIME AS NEEDED FOR SLEEP (DO  NOT  COMBINE  WITH  OTHER  SEDATING  MEDICATIONS)   No facility-administered encounter medications on file as of 10/09/2017.     PAST MEDICAL HISTORY:   Past Medical History:  Diagnosis Date  . Arthritis   . Chronic insomnia   . Complication of anesthesia    severe itching night of surgery requiring meds- also couldnt swallow- throat "paralyzed""  . Fibromyalgia   . Glaucoma   . Hypercholesterolemia   . Hyperlipidemia   . Hypertension    PCP Dr Cari Caraway- [facility] [place] [facility]  05/15/11 with clearance and note on chart,    chest x ray, EKG 12/12 [facility], eccho 7/11 [facility]  . Hypothyroidism    s/p graves disease  . Kidney stone   . PD (Parkinson's disease) ([place])   . Peripheral vascular disease ([place])    PULMONARY EMBOLUS x 2- 2011, 2012/ FOLLOWED BY DR [id number]-LOV 12/12 [facility]   PT STAES WILL STOP  COUMADIN 3/12 and begin LOVONOX 05/17/11 as previously instructed  . Prediabetes   . Sinus infection    at present- dr Honor Loh PA aware- was seen there and at PCP 05/10/11  . Thyroid disease    Hypothyroidism    PAST SURGICAL HISTORY:   Past Surgical History:  Procedure Laterality Date  . ABDOMINAL HYSTERECTOMY    . APPENDECTOMY    . CATARACT EXTRACTION Bilateral   . CHOLECYSTECTOMY    . COLONOSCOPY    . EXPLORATORY LAPAROTOMY    . JOINT REPLACEMENT  left knee  7/12  . TOTAL KNEE ARTHROPLASTY  05/22/2011   Procedure: TOTAL KNEE ARTHROPLASTY;  Surgeon: Mauri Pole, MD;  Location: [facility];  Service: Orthopedics;  Laterality: Right;    SOCIAL HISTORY:   Social History   Socioeconomic History  . Marital status: Widowed    Spouse name: Not on file  . Number of children: 2  . Years of education: Not on file  . Highest education level: Not on file  Occupational History  . Not on file  Social Needs  . Financial resource strain: Not on file  . Food insecurity:    Worry: Not on file    Inability: Not on file  . Transportation needs:    Medical: Not on file    Non-medical: Not on file  Tobacco Use  . Smoking status: Former Smoker    Packs/day: 1.00    Last attempt to quit: 05/10/1989    Years since quitting: 28.4  . Smokeless tobacco: Never Used  Substance and Sexual Activity  . Alcohol use: Yes    Alcohol/week: 0.0 oz    Comment: one drink once a month  . Drug use: No  . Sexual activity: Not on file  Lifestyle  . Physical activity:    Days per week: Not on file    Minutes per session: Not on file  . Stress: Not on file  Relationships  . Social connections:    Talks on phone: Not on file    Gets together: Not on file    Attends religious service: Not on file    Active member of club or organization: Not on file    Attends meetings of clubs or organizations: Not on file    Relationship status: Not on file  . Intimate partner violence:    Fear of current or ex  partner: Not on file    Emotionally abused: Not on file    Physically abused: Not on file    Forced sexual activity: Not on file  Other Topics Concern  . Not on file  Social History Narrative   Lives at home alone   Drinks caffeine occasionally     FAMILY HISTORY:   Family Status  Relation Name Status  . Mother  Deceased at age 69       peritonitis  . Father  Deceased at age 67       stroke, prostate cancer  . Sister  Alive       10 siblings - 1 cerebral palsy  . Brother  Alive    ROS:  A complete 10 system review of systems was obtained and was unremarkable apart from what is mentioned above.  PHYSICAL EXAMINATION:    VITALS:   Vitals:   10/09/17 1121  BP: 120/76  Pulse: (!) 118  SpO2: 96%  Weight: 196 lb (88.9 kg)  Height: 5\' 6"  (1.676 m)   Wt Readings from Last 3 Encounters:  10/09/17 196 lb (88.9 kg)  07/03/17 192 lb (87.1 kg)  05/28/17 195 lb 3.2 oz (88.5 kg)     GEN:  The patient appears stated age and is in NAD. HEENT:  Normocephalic, atraumatic.  CV:  Tachycardic.  Regular rhythm.   Lungs:  CTAB.   Neck/HEME:  There is a soft right carotid bruit.   There is decreased range of motion of the neck.  She has difficulty turning the head to the right.  Head is turned to the left and slightly in the flexed position.  Neurological examination:  Orientation:  Montreal Cognitive Assessment  01/25/2016  Visuospatial/ Executive (0/5) 2  Naming (0/3) 3  Attention: Read list of digits (0/2) 2  Attention: Read list of letters (0/1) 0  Attention: Serial 7 subtraction starting at 100 (0/3) 2  Language: Repeat phrase (0/2) 2  Language : Fluency (0/1) 0  Abstraction (0/2) 2  Delayed Recall (0/5) 5  Orientation (0/6) 5  Total 23  Adjusted Score (based on education) 23   :  Orientation: The patient is alert and oriented x3. Cranial nerves: There is good facial symmetry. The speech is fluent and clear. Soft palate rises symmetrically and there is no tongue  deviation. Hearing is intact to conversational tone. Sensation: Sensation is intact to light touch throughout Motor: Strength is 5/5 in the bilateral upper and lower extremities.   Shoulder shrug is equal and symmetric.  There is no pronator drift.  Movement examination: Tone: There is mild increased tone in the right upper extremity. Abnormal movements: None Coordination:  There is mild decremation with RAM's, seen mostly with finger taps bilaterally. Gait and Station: The patient has  difficulty arising out of a deep-seated chair without the use of the hands.  She pushes off of the chair.  She actually walks fairly well with a walker.   ASSESSMENT/PLAN:  1.  Parkinsonism  -I used to think that [place] was in the differential, but I have seen her for quite some time and do not think that she has MSA.  -  She has some mild cervical dystonia (has difficulty turning the head to the right) and had early diplopia and falls.  Her DaT scan demonstrated decreased uptake of the tracer in the right putamen, but it was very mild.   I am not convinced that everything that she reports as "side effects" of levodopa are really side effects.  She was originally taking Rytary because of that, but ultimately went back to levodopa without issues.  She asked me about going back to Rytary because she thought it worked slightly better.  However, I just think she needs to increase the levodopa.  She will take carbidopa/levodopa 25/100, 2 tablets in the morning, 1 in the afternoon and 1 in the evening.  -Explained again that current walker does not prevent falls backwards.  She knows this and have discussed it several times in the past.  She has changed back to her walker to a traditional walker from a Rollator and that has helped.  -Recommended raised toilet seat.  Recommended working with physical therapy on getting off and on recumbent bike.  -Discussed safety.  Discussed importance of safe, cardiovascular exercise. 2.   Fall with fx of C5 spinous process  -did inpatient rehab stay after this.  Now doing subacute rehab 3.  Fibromyalgia  -Stable and no longer seeing Dr. Letta Pate. 3.  Mild cognitive impairment  -Neuropsych testing done in July 2018 did not show evidence of dementia. 4.  cervical dystonia  -she is interested in botox.  Discussed risk, benefits, and side effects which included but were not limited to dysphagia.  We will try to get her scheduled. 5.  Follow-up in the next 5 months, sooner should new neurologic issues arise.  Greater than 50% of the 25-minute visit was spent in counseling with patient and her daughter.

## 2017-09-27 ENCOUNTER — Telehealth: Payer: Self-pay

## 2017-09-27 ENCOUNTER — Other Ambulatory Visit: Payer: Medicare Other

## 2017-09-27 ENCOUNTER — Encounter: Payer: Self-pay | Admitting: Family Medicine

## 2017-09-27 ENCOUNTER — Ambulatory Visit (INDEPENDENT_AMBULATORY_CARE_PROVIDER_SITE_OTHER): Payer: Medicare Other

## 2017-09-27 DIAGNOSIS — E039 Hypothyroidism, unspecified: Secondary | ICD-10-CM

## 2017-09-27 DIAGNOSIS — M6281 Muscle weakness (generalized): Secondary | ICD-10-CM | POA: Diagnosis not present

## 2017-09-27 DIAGNOSIS — R739 Hyperglycemia, unspecified: Secondary | ICD-10-CM | POA: Diagnosis not present

## 2017-09-27 DIAGNOSIS — M542 Cervicalgia: Secondary | ICD-10-CM | POA: Diagnosis not present

## 2017-09-27 DIAGNOSIS — R262 Difficulty in walking, not elsewhere classified: Secondary | ICD-10-CM | POA: Diagnosis not present

## 2017-09-27 DIAGNOSIS — M25512 Pain in left shoulder: Secondary | ICD-10-CM | POA: Diagnosis not present

## 2017-09-27 DIAGNOSIS — I10 Essential (primary) hypertension: Secondary | ICD-10-CM

## 2017-09-27 DIAGNOSIS — R260 Ataxic gait: Secondary | ICD-10-CM | POA: Diagnosis not present

## 2017-09-27 DIAGNOSIS — Z7901 Long term (current) use of anticoagulants: Secondary | ICD-10-CM

## 2017-09-27 DIAGNOSIS — M25511 Pain in right shoulder: Secondary | ICD-10-CM | POA: Diagnosis not present

## 2017-09-27 LAB — HEMOGLOBIN A1C: Hgb A1c MFr Bld: 6.4 % (ref 4.6–6.5)

## 2017-09-27 NOTE — Telephone Encounter (Addendum)
Patient came in for INR. Still having problems with machine so patient had to have A1c drawn.   Will call patient with instructions once results come in and provider review

## 2017-09-27 NOTE — Progress Notes (Signed)
Pre visit review using our clinic tool,if applicable. No additional management support is needed unless otherwise documented below in the visit note.   Patient came in for INR. Problem with INR machine sio patient had lab drawn (A1c).

## 2017-09-28 DIAGNOSIS — M25512 Pain in left shoulder: Secondary | ICD-10-CM | POA: Diagnosis not present

## 2017-09-28 DIAGNOSIS — R260 Ataxic gait: Secondary | ICD-10-CM | POA: Diagnosis not present

## 2017-09-28 DIAGNOSIS — R262 Difficulty in walking, not elsewhere classified: Secondary | ICD-10-CM | POA: Diagnosis not present

## 2017-09-28 DIAGNOSIS — M25511 Pain in right shoulder: Secondary | ICD-10-CM | POA: Diagnosis not present

## 2017-09-28 DIAGNOSIS — M542 Cervicalgia: Secondary | ICD-10-CM | POA: Diagnosis not present

## 2017-09-28 DIAGNOSIS — M6281 Muscle weakness (generalized): Secondary | ICD-10-CM | POA: Diagnosis not present

## 2017-09-29 ENCOUNTER — Telehealth: Payer: Self-pay | Admitting: Family Medicine

## 2017-09-29 DIAGNOSIS — Z7901 Long term (current) use of anticoagulants: Secondary | ICD-10-CM

## 2017-09-29 NOTE — Telephone Encounter (Signed)
Called her - it seems that because our POC INR machine was broken she ended up in the lab and had an A1c drawn for some reason instead?  In any case, her A1c is in the pre-diabetes range.  She admits that she is eating dessert daily at her new place at the [facility]- she will cut down on sweets and we will repeat her A1c in 6 months She will come in next week for an INR- I placed a standing lab order for her  [name] [name]

## 2017-10-01 ENCOUNTER — Encounter: Payer: Self-pay | Admitting: Family Medicine

## 2017-10-01 ENCOUNTER — Telehealth: Payer: Self-pay

## 2017-10-01 NOTE — Telephone Encounter (Addendum)
Called patient to see if we could re-scheduled lab for INR. Incorrect lab was drawn when patient was in last week. Left message on patients answering machine for return call to schedule.

## 2017-10-02 ENCOUNTER — Other Ambulatory Visit (INDEPENDENT_AMBULATORY_CARE_PROVIDER_SITE_OTHER): Payer: Medicare Other

## 2017-10-02 ENCOUNTER — Telehealth: Payer: Self-pay | Admitting: *Deleted

## 2017-10-02 ENCOUNTER — Ambulatory Visit: Payer: Medicare Other

## 2017-10-02 ENCOUNTER — Ambulatory Visit (INDEPENDENT_AMBULATORY_CARE_PROVIDER_SITE_OTHER): Payer: Medicare Other | Admitting: Family Medicine

## 2017-10-02 ENCOUNTER — Encounter: Payer: Self-pay | Admitting: Family Medicine

## 2017-10-02 VITALS — BP 149/90 | HR 90 | Temp 98.1°F | Resp 18 | Ht 65.5 in

## 2017-10-02 DIAGNOSIS — R1030 Lower abdominal pain, unspecified: Secondary | ICD-10-CM | POA: Diagnosis not present

## 2017-10-02 DIAGNOSIS — R319 Hematuria, unspecified: Secondary | ICD-10-CM | POA: Diagnosis not present

## 2017-10-02 DIAGNOSIS — Z87442 Personal history of urinary calculi: Secondary | ICD-10-CM

## 2017-10-02 DIAGNOSIS — Z7901 Long term (current) use of anticoagulants: Secondary | ICD-10-CM | POA: Diagnosis not present

## 2017-10-02 DIAGNOSIS — N390 Urinary tract infection, site not specified: Secondary | ICD-10-CM | POA: Diagnosis not present

## 2017-10-02 DIAGNOSIS — R3911 Hesitancy of micturition: Secondary | ICD-10-CM | POA: Diagnosis not present

## 2017-10-02 LAB — BASIC METABOLIC PANEL
BUN: 11 mg/dL (ref 6–23)
CO2: 26 mEq/L (ref 19–32)
Calcium: 9.9 mg/dL (ref 8.4–10.5)
Chloride: 100 mEq/L (ref 96–112)
Creatinine, Ser: 0.78 mg/dL (ref 0.40–1.20)
GFR: 76.61 mL/min (ref >60.00)
GLUCOSE: 120 mg/dL — AB (ref 70–99)
POTASSIUM: 3.7 meq/L (ref 3.5–5.1)
SODIUM: 137 meq/L (ref 135–145)

## 2017-10-02 LAB — PROTIME-INR
INR: 2 ratio — AB (ref 0.8–1.0)
PROTHROMBIN TIME: 22.9 s — AB (ref 9.6–13.1)

## 2017-10-02 LAB — POC URINALSYSI DIPSTICK (AUTOMATED)
Glucose, UA: POSITIVE — AB
Nitrite, UA: POSITIVE
PROTEIN UA: POSITIVE — AB
Urobilinogen, UA: 4 E.U./dL — AB
pH, UA: 5 (ref 5.0–8.0)

## 2017-10-02 MED ORDER — TAMSULOSIN HCL 0.4 MG PO CAPS: 0.4000 mg | ORAL_CAPSULE | Freq: Every day | ORAL | 0 refills | Status: DC

## 2017-10-02 MED ORDER — CEPHALEXIN 500 MG PO CAPS: 500.0000 mg | ORAL_CAPSULE | Freq: Two times a day (BID) | ORAL | 0 refills | Status: DC

## 2017-10-02 MED ORDER — CEPHALEXIN 500 MG PO CAPS: 500.0000 mg | ORAL_CAPSULE | Freq: Two times a day (BID) | ORAL | 0 refills | Status: AC

## 2017-10-02 MED FILL — TAMSULOSIN HCL 0.4 MG CAP: 0.4 | 14 days supply | Qty: 14 | Fill #0

## 2017-10-02 MED FILL — CEPHALEXIN 500 MG CAPSULE: 500 | 5 days supply | Qty: 10 | Fill #0

## 2017-10-02 NOTE — Telephone Encounter (Signed)
Per verbal from PCP, cancelled cephalexin and flomax RXs at [facility].  PCP has re-sent prescriptions.

## 2017-10-02 NOTE — Telephone Encounter (Signed)
Called patietn to verify she had seen orders Dr. Lorelei Pont had e-mailed her. Left message on answering machine.

## 2017-10-02 NOTE — Patient Instructions (Signed)
Wonderful to meet you! Please get lab drawn before leaving Keflex 2x per day for 5 days Short course of flomax to help urinary hesitancy CT scan to be scheduled Increase fluids If pain worsens, fever develops - do not hesitate to contact our office right away

## 2017-10-02 NOTE — Progress Notes (Signed)
Subjective:    Patient ID: [name] [name], female    DOB: 1945/12/08, 72 y.o.   MRN: [id number]  HPI Patient presents to clinic c/o painful urination and right sided flank pain. Notes she did have a slight fever last night. She started taking AZO yesterday, which has only minimally helped her symptoms.   Her past medical history reviewed and she does have a history of recurrent UTI. She had a CT of ABD/PELVIS done 05/2017 which showed:  IMPRESSION: 1. Right-sided renal calculi and 5.5 x 4.5 mm nonobstructing mid right ureteral calculus. 2. No left-sided renal or ureteral calculi. 3. No worrisome renal or bladder lesions. 4. No abdominal/pelvic mass lesions or adenopathy. 5. Diffuse and fairly marked fatty infiltration of the liver. 6. Stable right adrenal gland adenomas.  She also is chronically on coumadin for anticoagulation and had INR drawn today, 7.30.19, results of this test are pending.  Lab Results  Component Value Date   INR 2.2 08/28/2017   INR 1.7 (A) 08/14/2017   INR 2.23 07/03/2017   PROTIME 32.4 (H) 02/14/2011   PROTIME 36.0 (H) 07/26/2010   Patient Active Problem List   Diagnosis Date Noted  . Sepsis secondary to UTI ([place]) 02/12/2017  . Elevated troponin 02/12/2017  . AKI (acute kidney injury) ([place]) 02/12/2017  . Recurrent UTI 11/23/2016  . Fall at nursing home 11/23/2016  . PD (Parkinson's disease) ([place]) 11/23/2016  . Sepsis due to urinary tract infection ([place]) 11/23/2016  . Fibromyalgia 11/23/2016  . Hypercholesterolemia 11/23/2016  . Hypertension 11/23/2016  . Hypothyroidism, adult 11/23/2016  . Elevated CK 11/23/2016  . Pulmonary HTN ([place]) 11/23/2016  . Left ventricular diastolic dysfunction 38/18/2993  . Metabolic encephalopathy 71/69/6789  . History of pulmonary embolus (PE) 2/2 hypercoagulable state 11/23/2016  . Sepsis ([place]) 09/17/2016  . Acute metabolic encephalopathy 38/12/1749  . Cellulitis of right foot 09/17/2016  . Fall   . UTI (urinary  tract infection) 04/29/2016  . SIRS (systemic inflammatory response syndrome) ([place]) 04/29/2016  . Hyperlipidemia 04/29/2016  . Hypothyroidism 04/29/2016  . Hematuria 04/05/2016  . Cough 04/05/2016  . Benign essential HTN   . Intractable pain 12/17/2015  . Displaced fracture of fifth cervical vertebra ([place]) 12/17/2015  . Gait disturbance 12/17/2015  . Trochanteric bursitis of both hips 09/13/2015  . Chronic low back pain 06/07/2015  . Fibromyalgia syndrome 05/17/2015  . Thyroid disease 05/17/2015  . Parkinson's disease ([place]) 05/17/2015  . Hypercoagulable state ([place]) 02/07/2012  . History of pulmonary embolism 02/07/2012  . S/P right TKA 05/22/2011     Review of Systems  Constitutional: Positive for fatigue and fever. Negative for activity change, appetite change, chills, diaphoresis and unexpected weight change.  HENT: Negative for congestion, ear pain, nosebleeds, postnasal drip, sinus pain and sore throat.   Respiratory: Negative for apnea, cough, chest tightness, shortness of breath and wheezing.   Cardiovascular: Negative for chest pain, palpitations and leg swelling.  Gastrointestinal: Positive for abdominal pain and nausea. Negative for constipation, diarrhea and vomiting.       Mild nausea last night  Endocrine: Negative for cold intolerance and heat intolerance.  Genitourinary: Positive for difficulty urinating, dysuria and flank pain. Negative for urgency.  Skin: Negative for color change, pallor and rash.  Neurological: Negative for dizziness, tremors and headaches.       Objective:   Physical Exam  Constitutional: She is oriented to person, place, and time. She appears well-developed. No distress.  HENT:  Head: Normocephalic and atraumatic.  Eyes: Conjunctivae and  EOM are normal. Right eye exhibits no discharge. Left eye exhibits no discharge. No scleral icterus.  Neck: Normal range of motion. Neck supple. No tracheal deviation present.  Cardiovascular: Normal  rate, regular rhythm and normal heart sounds.  Pulmonary/Chest: Effort normal and breath sounds normal. No respiratory distress. She has no wheezes. She has no rales. She exhibits no tenderness.  Abdominal: Soft. Bowel sounds are normal. There is tenderness. There is no rebound and no guarding.  Suprapubic ABD tenderness. Right flank tenderness with mild tapping of area.   Musculoskeletal:  Gait is slow, uses walker. This is baseline for patient.   Neurological: She is alert and oriented to person, place, and time.  Skin: Skin is warm and dry. Capillary refill takes less than 2 seconds. No erythema. No pallor.  Psychiatric: She has a normal mood and affect. Her behavior is normal. Thought content normal.  Nursing note and vitals reviewed.  Blood pressure (!) 149/90, pulse 90, temperature 98.1 F (36.7 C), temperature source Oral, resp. rate 18, height 5' 5.5" (1.664 m).  Urine Dipstick results are skewed due to AZO. Patient attempted x2 to give another urine sample in clinic but was not successful.         Assessment & Plan:  UTI - Will treat with keflex course. Reports allergy to macrobid.  Increase fluids. May continue to use AZO 3 times per day as needed for bladder pain for next 3 days  Lower ABD pain and Hx of Renal stone - Past CT did show Right-sided renal calculi and 5.5 x 4.5 mm nonobstructing mid right ureteral calculus. We will get new CT scan to evaluate possible changes due to acute pain today. BMET ordered to eval kidney function.  Urinary hesitancy - Most likely related to UTI and possible worsening of kidney stone, will trial short course of flomax to see if this helps.  If CT scan shows worsening of renal calculi will consider referral to Urology for further management.

## 2017-10-02 NOTE — Telephone Encounter (Signed)
Received Physician Orders/PT Plan of Care from [facility]; forwarded to provider/SLS 07/30

## 2017-10-05 ENCOUNTER — Telehealth: Payer: Self-pay | Admitting: *Deleted

## 2017-10-05 NOTE — Telephone Encounter (Signed)
Spoke with pt. She states she is unable to schedule the CT scan at this time. "I don't really think it is a kidney stone, I am able to pee like crazy now. I am just going to treat this UTI."  Pt states she will call us back if symptoms do not improve or gets worse.

## 2017-10-05 NOTE — Telephone Encounter (Signed)
Ok, thank you

## 2017-10-05 NOTE — Telephone Encounter (Signed)
-----   Message from Jodelle Green, FNP sent at 10/05/2017  8:02 AM EDT ----- Regarding: FW: ct renal Hi Eda Magnussen,  I got this note about them not being able to schedule the patients CT scan - If you could follow up on this I would appreciate it!  Thanks,  Philis Nettle  ----- Message ----- From: Katha Hamming Sent: 10/04/2017   1:02 PM To: Thersa Salt, FNP Subject: ct renal                                       We have left messages for Marvelyn to call back to schedule her CT.  No return calls.   Thanks, Hoyle Sauer

## 2017-10-09 ENCOUNTER — Ambulatory Visit (INDEPENDENT_AMBULATORY_CARE_PROVIDER_SITE_OTHER): Payer: Medicare Other | Admitting: Neurology

## 2017-10-09 ENCOUNTER — Encounter: Payer: Self-pay | Admitting: Neurology

## 2017-10-09 VITALS — BP 120/76 | HR 118 | Ht 66.0 in | Wt 196.0 lb

## 2017-10-09 DIAGNOSIS — G243 Spasmodic torticollis: Secondary | ICD-10-CM

## 2017-10-09 DIAGNOSIS — G20 Parkinson's disease: Secondary | ICD-10-CM

## 2017-10-09 MED ORDER — CARBIDOPA-LEVODOPA 25-100 MG PO TABS: ORAL_TABLET | ORAL | 1 refills | Status: DC

## 2017-10-09 NOTE — Patient Instructions (Addendum)
Increase Carbidopa Levodopa 25/100 - 2 in the morning, 1 in the afternoon, and 1 in the evening.   We will work on [employer] authorization. We have scheduled you for our next available clinic and will let you know if there is a problem with your insurance prior to this appointment.

## 2017-10-10 DIAGNOSIS — R262 Difficulty in walking, not elsewhere classified: Secondary | ICD-10-CM | POA: Diagnosis not present

## 2017-10-10 DIAGNOSIS — M542 Cervicalgia: Secondary | ICD-10-CM | POA: Diagnosis not present

## 2017-10-10 DIAGNOSIS — M25512 Pain in left shoulder: Secondary | ICD-10-CM | POA: Diagnosis not present

## 2017-10-10 DIAGNOSIS — R260 Ataxic gait: Secondary | ICD-10-CM | POA: Diagnosis not present

## 2017-10-10 DIAGNOSIS — M25511 Pain in right shoulder: Secondary | ICD-10-CM | POA: Diagnosis not present

## 2017-10-10 DIAGNOSIS — M6281 Muscle weakness (generalized): Secondary | ICD-10-CM | POA: Diagnosis not present

## 2017-10-10 DIAGNOSIS — G20 Parkinson's disease: Secondary | ICD-10-CM | POA: Diagnosis not present

## 2017-10-12 DIAGNOSIS — M25512 Pain in left shoulder: Secondary | ICD-10-CM | POA: Diagnosis not present

## 2017-10-12 DIAGNOSIS — M6281 Muscle weakness (generalized): Secondary | ICD-10-CM | POA: Diagnosis not present

## 2017-10-12 DIAGNOSIS — R260 Ataxic gait: Secondary | ICD-10-CM | POA: Diagnosis not present

## 2017-10-12 DIAGNOSIS — M542 Cervicalgia: Secondary | ICD-10-CM | POA: Diagnosis not present

## 2017-10-12 DIAGNOSIS — R000 Tachycardia, unspecified: Secondary | ICD-10-CM | POA: Diagnosis not present

## 2017-10-12 DIAGNOSIS — M25511 Pain in right shoulder: Secondary | ICD-10-CM | POA: Diagnosis not present

## 2017-10-12 DIAGNOSIS — R110 Nausea: Secondary | ICD-10-CM | POA: Diagnosis not present

## 2017-10-12 DIAGNOSIS — S0990XA Unspecified injury of head, initial encounter: Secondary | ICD-10-CM | POA: Diagnosis not present

## 2017-10-12 DIAGNOSIS — R262 Difficulty in walking, not elsewhere classified: Secondary | ICD-10-CM | POA: Diagnosis not present

## 2017-10-12 DIAGNOSIS — I959 Hypotension, unspecified: Secondary | ICD-10-CM | POA: Diagnosis not present

## 2017-10-13 ENCOUNTER — Inpatient Hospital Stay (HOSPITAL_COMMUNITY)
Admission: EM | Admit: 2017-10-13 | Discharge: 2017-10-17 | DRG: 690 | Disposition: A | Discharge status: Home / Self Care | Payer: Medicare Other | Attending: Internal Medicine | Admitting: Internal Medicine

## 2017-10-13 ENCOUNTER — Encounter (HOSPITAL_COMMUNITY): Payer: Self-pay

## 2017-10-13 ENCOUNTER — Emergency Department (HOSPITAL_COMMUNITY): Payer: Medicare Other

## 2017-10-13 ENCOUNTER — Other Ambulatory Visit: Payer: Self-pay

## 2017-10-13 DIAGNOSIS — R42 Dizziness and giddiness: Secondary | ICD-10-CM | POA: Diagnosis not present

## 2017-10-13 DIAGNOSIS — E0500 Thyrotoxicosis with diffuse goiter without thyrotoxic crisis or storm: Secondary | ICD-10-CM | POA: Diagnosis present

## 2017-10-13 DIAGNOSIS — N390 Urinary tract infection, site not specified: Secondary | ICD-10-CM | POA: Diagnosis present

## 2017-10-13 DIAGNOSIS — D3501 Benign neoplasm of right adrenal gland: Secondary | ICD-10-CM | POA: Diagnosis present

## 2017-10-13 DIAGNOSIS — S0990XA Unspecified injury of head, initial encounter: Secondary | ICD-10-CM | POA: Diagnosis present

## 2017-10-13 DIAGNOSIS — E039 Hypothyroidism, unspecified: Secondary | ICD-10-CM | POA: Diagnosis present

## 2017-10-13 DIAGNOSIS — W1830XA Fall on same level, unspecified, initial encounter: Secondary | ICD-10-CM | POA: Diagnosis present

## 2017-10-13 DIAGNOSIS — S12400A Unspecified displaced fracture of fifth cervical vertebra, initial encounter for closed fracture: Secondary | ICD-10-CM | POA: Diagnosis present

## 2017-10-13 DIAGNOSIS — R269 Unspecified abnormalities of gait and mobility: Secondary | ICD-10-CM | POA: Diagnosis not present

## 2017-10-13 DIAGNOSIS — M1990 Unspecified osteoarthritis, unspecified site: Secondary | ICD-10-CM | POA: Diagnosis present

## 2017-10-13 DIAGNOSIS — Z8673 Personal history of transient ischemic attack (TIA), and cerebral infarction without residual deficits: Secondary | ICD-10-CM

## 2017-10-13 DIAGNOSIS — Y92003 Bedroom of unspecified non-institutional (private) residence as the place of occurrence of the external cause: Secondary | ICD-10-CM | POA: Diagnosis not present

## 2017-10-13 DIAGNOSIS — Z7901 Long term (current) use of anticoagulants: Secondary | ICD-10-CM

## 2017-10-13 DIAGNOSIS — H409 Unspecified glaucoma: Secondary | ICD-10-CM

## 2017-10-13 DIAGNOSIS — R7303 Prediabetes: Secondary | ICD-10-CM | POA: Diagnosis present

## 2017-10-13 DIAGNOSIS — Z79899 Other long term (current) drug therapy: Secondary | ICD-10-CM

## 2017-10-13 DIAGNOSIS — E079 Disorder of thyroid, unspecified: Secondary | ICD-10-CM | POA: Diagnosis present

## 2017-10-13 DIAGNOSIS — I7389 Other specified peripheral vascular diseases: Secondary | ICD-10-CM | POA: Diagnosis present

## 2017-10-13 DIAGNOSIS — I10 Essential (primary) hypertension: Secondary | ICD-10-CM | POA: Diagnosis not present

## 2017-10-13 DIAGNOSIS — M797 Fibromyalgia: Secondary | ICD-10-CM | POA: Diagnosis not present

## 2017-10-13 DIAGNOSIS — G3184 Mild cognitive impairment, so stated: Secondary | ICD-10-CM | POA: Diagnosis present

## 2017-10-13 DIAGNOSIS — Z87440 Personal history of urinary (tract) infections: Secondary | ICD-10-CM

## 2017-10-13 DIAGNOSIS — Z86711 Personal history of pulmonary embolism: Secondary | ICD-10-CM | POA: Diagnosis not present

## 2017-10-13 DIAGNOSIS — G459 Transient cerebral ischemic attack, unspecified: Secondary | ICD-10-CM | POA: Diagnosis present

## 2017-10-13 DIAGNOSIS — Z66 Do not resuscitate: Secondary | ICD-10-CM | POA: Diagnosis present

## 2017-10-13 DIAGNOSIS — G20 Parkinson's disease: Secondary | ICD-10-CM | POA: Diagnosis present

## 2017-10-13 DIAGNOSIS — F329 Major depressive disorder, single episode, unspecified: Secondary | ICD-10-CM | POA: Diagnosis present

## 2017-10-13 DIAGNOSIS — N3000 Acute cystitis without hematuria: Secondary | ICD-10-CM | POA: Diagnosis not present

## 2017-10-13 DIAGNOSIS — Z96653 Presence of artificial knee joint, bilateral: Secondary | ICD-10-CM | POA: Diagnosis present

## 2017-10-13 DIAGNOSIS — S199XXA Unspecified injury of neck, initial encounter: Secondary | ICD-10-CM | POA: Diagnosis not present

## 2017-10-13 DIAGNOSIS — E7800 Pure hypercholesterolemia, unspecified: Secondary | ICD-10-CM | POA: Diagnosis present

## 2017-10-13 DIAGNOSIS — N200 Calculus of kidney: Secondary | ICD-10-CM | POA: Diagnosis present

## 2017-10-13 DIAGNOSIS — Z87442 Personal history of urinary calculi: Secondary | ICD-10-CM

## 2017-10-13 DIAGNOSIS — Z79890 Hormone replacement therapy (postmenopausal): Secondary | ICD-10-CM

## 2017-10-13 DIAGNOSIS — Z87898 Personal history of other specified conditions: Secondary | ICD-10-CM | POA: Diagnosis not present

## 2017-10-13 DIAGNOSIS — F5104 Psychophysiologic insomnia: Secondary | ICD-10-CM | POA: Diagnosis present

## 2017-10-13 DIAGNOSIS — Z1612 Extended spectrum beta lactamase (ESBL) resistance: Secondary | ICD-10-CM | POA: Diagnosis not present

## 2017-10-13 DIAGNOSIS — Z87891 Personal history of nicotine dependence: Secondary | ICD-10-CM

## 2017-10-13 DIAGNOSIS — E785 Hyperlipidemia, unspecified: Secondary | ICD-10-CM | POA: Diagnosis present

## 2017-10-13 DIAGNOSIS — B9629 Other Escherichia coli [E. coli] as the cause of diseases classified elsewhere: Secondary | ICD-10-CM | POA: Diagnosis not present

## 2017-10-13 DIAGNOSIS — Z888 Allergy status to other drugs, medicaments and biological substances status: Secondary | ICD-10-CM

## 2017-10-13 HISTORY — DX: Personal history of other specified conditions: Z87.898

## 2017-10-13 HISTORY — DX: Unspecified glaucoma: H40.9

## 2017-10-13 HISTORY — DX: Personal history of transient ischemic attack (TIA), and cerebral infarction without residual deficits: Z86.73

## 2017-10-13 LAB — URINALYSIS, ROUTINE W REFLEX MICROSCOPIC
BILIRUBIN URINE: NEGATIVE
Glucose, UA: NEGATIVE mg/dL
Ketones, ur: NEGATIVE mg/dL
LEUKOCYTES UA: NEGATIVE
Nitrite: POSITIVE — AB
PROTEIN: NEGATIVE mg/dL
Specific Gravity, Urine: 1.01 (ref 1.005–1.030)
pH: 6 (ref 5.0–8.0)

## 2017-10-13 LAB — CBC
HEMATOCRIT: 41.8 % (ref 36.0–46.0)
Hemoglobin: 13.5 g/dL (ref 12.0–15.0)
MCH: 29.6 pg (ref 26.0–34.0)
MCHC: 32.3 g/dL (ref 30.0–36.0)
MCV: 91.7 fL (ref 78.0–100.0)
PLATELETS: 251 10*3/uL (ref 150–400)
RBC: 4.56 MIL/uL (ref 3.87–5.11)
RDW: 12.6 % (ref 11.5–15.5)
WBC: 7.1 10*3/uL (ref 4.0–10.5)

## 2017-10-13 LAB — COMPREHENSIVE METABOLIC PANEL
ALBUMIN: 3.2 g/dL — AB (ref 3.5–5.0)
ALT: 6 U/L (ref 0–44)
ANION GAP: 9 (ref 5–15)
AST: 22 U/L (ref 15–41)
Alkaline Phosphatase: 115 U/L (ref 38–126)
BILIRUBIN TOTAL: 0.7 mg/dL (ref 0.3–1.2)
BUN: 10 mg/dL (ref 8–23)
CO2: 23 mmol/L (ref 22–32)
Calcium: 8.8 mg/dL — ABNORMAL LOW (ref 8.9–10.3)
Chloride: 106 mmol/L (ref 98–111)
Creatinine, Ser: 0.77 mg/dL (ref 0.44–1.00)
GFR calc Af Amer: >60 mL/min (ref >60)
GFR calc non Af Amer: >60 mL/min (ref >60)
GLUCOSE: 141 mg/dL — AB (ref 70–99)
POTASSIUM: 4.3 mmol/L (ref 3.5–5.1)
Sodium: 138 mmol/L (ref 135–145)
Total Protein: 5.6 g/dL — ABNORMAL LOW (ref 6.5–8.1)

## 2017-10-13 LAB — GLUCOSE, CAPILLARY
Glucose-Capillary: 172 mg/dL — ABNORMAL HIGH (ref 70–99)
Glucose-Capillary: 121 mg/dL — ABNORMAL HIGH (ref 70–99)

## 2017-10-13 LAB — PROTIME-INR
INR: 2.22
PROTHROMBIN TIME: 24.4 s — AB (ref 11.4–15.2)

## 2017-10-13 MED ORDER — CARBIDOPA-LEVODOPA 25-100 MG PO TABS
2.0000 | ORAL_TABLET | Freq: Every day | ORAL | Status: DC
  Administered 2017-10-13 – 2017-10-17 (×5): 2 via ORAL
  Filled 2017-10-13 (×5): qty 2

## 2017-10-13 MED ORDER — MAGNESIUM OXIDE 400 (241.3 MG) MG PO TABS
200.0000 mg | ORAL_TABLET | Freq: Every day | ORAL | Status: DC
  Administered 2017-10-13 – 2017-10-17 (×5): 200 mg via ORAL
  Filled 2017-10-13 (×5): qty 1

## 2017-10-13 MED ORDER — FENTANYL CITRATE (PF) 100 MCG/2ML IJ SOLN
50.0000 ug | Freq: Once | INTRAMUSCULAR | Status: AC
  Administered 2017-10-13: 50 ug via INTRAVENOUS
  Filled 2017-10-13: qty 2

## 2017-10-13 MED ORDER — SODIUM CHLORIDE 0.9 % IV SOLN
INTRAVENOUS | Status: DC
  Administered 2017-10-13 – 2017-10-14 (×3): via INTRAVENOUS

## 2017-10-13 MED ORDER — NICOTINE POLACRILEX 2 MG MT GUM
2.0000 mg | CHEWING_GUM | OROMUCOSAL | Status: DC | PRN
Start: 2017-10-13 — End: 2017-10-17
  Filled 2017-10-13: qty 1

## 2017-10-13 MED ORDER — WARFARIN SODIUM 4 MG PO TABS
4.0000 mg | ORAL_TABLET | Freq: Once | ORAL | Status: AC
  Administered 2017-10-13: 4 mg via ORAL
  Filled 2017-10-13: qty 1

## 2017-10-13 MED ORDER — WARFARIN - PHYSICIAN DOSING INPATIENT: Freq: Every day | Status: DC

## 2017-10-13 MED ORDER — PHENAZOPYRIDINE HCL 100 MG PO TABS
100.0000 mg | ORAL_TABLET | Freq: Two times a day (BID) | ORAL | Status: DC | PRN
  Administered 2017-10-14: 100 mg via ORAL
  Filled 2017-10-13 (×3): qty 1

## 2017-10-13 MED ORDER — VENLAFAXINE HCL ER 75 MG PO CP24
150.0000 mg | ORAL_CAPSULE | Freq: Every day | ORAL | Status: DC
  Administered 2017-10-13 – 2017-10-17 (×5): 150 mg via ORAL
  Filled 2017-10-13 (×5): qty 2

## 2017-10-13 MED ORDER — ZOLPIDEM TARTRATE 5 MG PO TABS
5.0000 mg | ORAL_TABLET | Freq: Every evening | ORAL | Status: DC | PRN
  Administered 2017-10-13 – 2017-10-16 (×4): 5 mg via ORAL
  Filled 2017-10-13 (×4): qty 1

## 2017-10-13 MED ORDER — ACETAMINOPHEN 325 MG PO TABS
650.0000 mg | ORAL_TABLET | Freq: Four times a day (QID) | ORAL | Status: DC | PRN
  Administered 2017-10-13: 650 mg via ORAL
  Filled 2017-10-13: qty 2

## 2017-10-13 MED ORDER — IRBESARTAN 300 MG PO TABS
150.0000 mg | ORAL_TABLET | Freq: Every day | ORAL | Status: DC
  Administered 2017-10-13 – 2017-10-14 (×2): 150 mg via ORAL
  Filled 2017-10-13 (×2): qty 1

## 2017-10-13 MED ORDER — KETOCONAZOLE 2 % EX SHAM: 1.0000 "application " | MEDICATED_SHAMPOO | CUTANEOUS | Status: DC

## 2017-10-13 MED ORDER — SODIUM CHLORIDE 0.9 % IV BOLUS
1000.0000 mL | Freq: Once | INTRAVENOUS | Status: AC
  Administered 2017-10-13: 1000 mL via INTRAVENOUS

## 2017-10-13 MED ORDER — PIPERACILLIN-TAZOBACTAM 3.375 G IVPB 30 MIN
3.3750 g | Freq: Once | INTRAVENOUS | Status: AC
  Administered 2017-10-13: 3.375 g via INTRAVENOUS
  Filled 2017-10-13: qty 50

## 2017-10-13 MED ORDER — WARFARIN SODIUM 4 MG PO TABS: 4.0000 mg | ORAL_TABLET | Freq: Every day | ORAL | Status: DC

## 2017-10-13 MED ORDER — LEVOTHYROXINE SODIUM 75 MCG PO TABS
150.0000 ug | ORAL_TABLET | Freq: Every day | ORAL | Status: DC
  Administered 2017-10-13 – 2017-10-17 (×5): 150 ug via ORAL
  Filled 2017-10-13 (×5): qty 2

## 2017-10-13 MED ORDER — SODIUM CHLORIDE 0.9 % IV SOLN
1.0000 g | Freq: Three times a day (TID) | INTRAVENOUS | Status: DC
  Administered 2017-10-13 – 2017-10-16 (×11): 1 g via INTRAVENOUS
  Filled 2017-10-13 (×12): qty 1

## 2017-10-13 MED ORDER — ONDANSETRON HCL 4 MG/2ML IJ SOLN: 4.0000 mg | Freq: Three times a day (TID) | INTRAMUSCULAR | Status: DC | PRN

## 2017-10-13 MED ORDER — HYDROCODONE-ACETAMINOPHEN 5-325 MG PO TABS
1.0000 | ORAL_TABLET | ORAL | Status: DC | PRN
  Administered 2017-10-13: 1 via ORAL
  Administered 2017-10-13: 2 via ORAL
  Administered 2017-10-13: 1 via ORAL
  Administered 2017-10-14: 2 via ORAL
  Administered 2017-10-15 – 2017-10-16 (×2): 1 via ORAL
  Filled 2017-10-13: qty 2
  Filled 2017-10-13: qty 1
  Filled 2017-10-13: qty 2
  Filled 2017-10-13 (×3): qty 1

## 2017-10-13 MED ORDER — VITAMIN D 1000 UNITS PO TABS
5000.0000 [IU] | ORAL_TABLET | Freq: Every day | ORAL | Status: DC
  Administered 2017-10-13 – 2017-10-17 (×5): 5000 [IU] via ORAL
  Filled 2017-10-13 (×6): qty 5

## 2017-10-13 MED ORDER — SENNOSIDES-DOCUSATE SODIUM 8.6-50 MG PO TABS: 1.0000 | ORAL_TABLET | Freq: Every evening | ORAL | Status: DC | PRN

## 2017-10-13 MED ORDER — CARBIDOPA-LEVODOPA 25-100 MG PO TABS
1.0000 | ORAL_TABLET | Freq: Every day | ORAL | Status: DC
  Administered 2017-10-13 – 2017-10-16 (×4): 1 via ORAL
  Filled 2017-10-13 (×4): qty 1

## 2017-10-13 MED ORDER — ENSURE ENLIVE PO LIQD
237.0000 mL | Freq: Two times a day (BID) | ORAL | Status: DC
  Administered 2017-10-13 – 2017-10-17 (×9): 237 mL via ORAL

## 2017-10-13 MED ORDER — KETOROLAC TROMETHAMINE 15 MG/ML IJ SOLN: 15.0000 mg | Freq: Four times a day (QID) | INTRAMUSCULAR | Status: DC | PRN

## 2017-10-13 MED ORDER — ATORVASTATIN CALCIUM 10 MG PO TABS
10.0000 mg | ORAL_TABLET | Freq: Every day | ORAL | Status: DC
  Administered 2017-10-13 – 2017-10-17 (×5): 10 mg via ORAL
  Filled 2017-10-13 (×5): qty 1

## 2017-10-13 MED ORDER — BISACODYL 10 MG RE SUPP: 10.0000 mg | Freq: Every day | RECTAL | Status: DC | PRN

## 2017-10-13 MED ORDER — HYDRALAZINE HCL 20 MG/ML IJ SOLN: 5.0000 mg | INTRAMUSCULAR | Status: DC | PRN

## 2017-10-13 NOTE — Progress Notes (Signed)
Nutrition Brief Note  Patient identified on the Malnutrition Screening Tool (MST) Report  Wt Readings from Last 15 Encounters:  10/13/17 92 kg  10/09/17 88.9 kg  07/03/17 87.1 kg  05/28/17 88.5 kg  05/18/17 90.3 kg  04/23/17 88.3 kg  03/22/17 87.4 kg  03/09/17 86.2 kg  03/01/17 87.4 kg  02/15/17 90.9 kg  12/28/16 90.5 kg  12/13/16 88.9 kg  12/07/16 88.5 kg  11/28/16 95.4 kg  10/26/16 90.5 kg   Patient with PMH significant for HTN, HLD, Parkinson's Disease, PVD, mild cognitive impairment with diagnosis of dementia, TIA, and recurrent UTI's. Presents this admission with UTI and generalized weakness. Pt denies any loss in appetite. She typically eats 2 meals with multiple snacks/day. She denies any recent wt loss and does not wish to have supplementation. No skin breakdown recorded.   Body mass index is 32.74 kg/m. Patient meets criteria for obese based on current BMI.   Current diet order is HH/CM.  Labs and medications reviewed.   No nutrition interventions warranted at this time. If nutrition issues arise, please consult RD.   Mariana Single RD, LDN Clinical Nutrition Pager # [phone]

## 2017-10-13 NOTE — H&P (Signed)
History and Physical    [name] [name] IBB:[id number] DOB: September 10, 1945 DOA: 10/13/2017   PCP: Darreld Mclean, MD   Patient coming from:  Home    Chief Complaint:  Headache and nausea   HPI: [name] [name] is a 72 y.o. female with medical history significant for hypertension, hyperlipidemia, hypothyroidism, Parkinson's disease, peripheral vascular disease, angina, mild cognitive impairment without a diagnosis of dementia, history of pulmonary embolism with hypercoagulable state, on chronic Coumadin, remote history of TIA, prediabetes, as well as a history of recurrent UTIs, "about twice a week ", last one with cultures positive for ESBL, treated with oral antibiotics for 2 weeks without any improvement, now presenting today with generalized weakness, dizziness since the diagnosis of UTI.  The patient attempted to get out of bed and use the bathroom, feeling weak, and falling over into a dresser, but denying loss of consciousness.  The patient remembers the fall.  She also reported along with the symptoms of burning with urination, taking twoPyridium, with some relief.  The patient denies any syncope, presyncope, vertigo, Denies fevers, chills, night sweats, new vision changes, or mucositis. Denies any respiratory complaints. Denies any chest pain or palpitations. Denies lower extremity swelling.   Denies abdominal pain, no diarrhea or constipation. Appetite is normal.  Denies abnormal skin rashes Denies any bleeding issues such as epistaxis, hematemesis, gross hematuria or hematochezia.  The patient smokes, but is controlled with nicotine gum, no alcohol or recreational drug use.   ED Course:  BP (!) 142/60 (BP Location: Left Arm)   Pulse 65   Temp 98 F (36.7 C) (Oral)   Resp 19   Ht 5\' 6"  (1.676 m)   Wt 92 kg   SpO2 98%   BMI 32.74 kg/m   Glucose 141 White count 7.1, the rest of the CBC is normal. Urine with positive nitrite Urine culture pending CT of the head negative for acute  intracranial abnormalities, no acute fracture of the cervical spine. Renal stone study on 10/13/2017 without acute abnormality to explain the symptoms, known right adrenal adenomas measuring up to 1.9 cm, known nonobstructing right renal stones up to 5 mm in size. PT and INR 24.4-2.22 respectively Last hemoglobin A1c was 6.4 in July 2019  Review of Systems:  As per HPI otherwise all other systems reviewed and are negative  Past Medical History:  Diagnosis Date  . Arthritis   . Chronic insomnia   . Complication of anesthesia    severe itching night of surgery requiring meds- also couldnt swallow- throat "paralyzed""  . Fibromyalgia   . Glaucoma   . Hypercholesterolemia   . Hyperlipidemia   . Hypertension    PCP Dr Cari Caraway- [facility]  05/15/11 with clearance and note on chart,    chest x ray, EKG 12/12 EP[facility], eccho 7/11 [facility]  . Hypothyroidism    s/p graves disease  . Kidney stone   . PD (Parkinson's disease) ([place])   . Peripheral vascular disease ([place])    PULMONARY EMBOLUS x 2- 2011, 2012/ FOLLOWED BY DR ODOGWU-LOV 12/12 EPIC   PT STAES WILL STOP COUMADIN 3/12 and begin LOVONOX 05/17/11 as previously instructed  . Prediabetes   . Sinus infection    at present- dr Honor Loh PA aware- was seen there and at PCP 05/10/11  . Thyroid disease    Hypothyroidism    Past Surgical History:  Procedure Laterality Date  . ABDOMINAL HYSTERECTOMY    . APPENDECTOMY    . CATARACT EXTRACTION Bilateral   .  CHOLECYSTECTOMY    . COLONOSCOPY    . EXPLORATORY LAPAROTOMY    . JOINT REPLACEMENT     left knee  7/12  . TOTAL KNEE ARTHROPLASTY  05/22/2011   Procedure: TOTAL KNEE ARTHROPLASTY;  Surgeon: Mauri Pole, MD;  Location: [facility];  Service: Orthopedics;  Laterality: Right;    Social History Social History   Socioeconomic History  . Marital status: Widowed    Spouse name: Not on file  . Number of children: 2  . Years of education: Not on file  . Highest education level: Not on file    Occupational History  . Not on file  Social Needs  . Financial resource strain: Not on file  . Food insecurity:    Worry: Not on file    Inability: Not on file  . Transportation needs:    Medical: Not on file    Non-medical: Not on file  Tobacco Use  . Smoking status: Former Smoker    Packs/day: 1.00    Last attempt to quit: 05/10/1989    Years since quitting: 28.4  . Smokeless tobacco: Never Used  Substance and Sexual Activity  . Alcohol use: Yes    Alcohol/week: 0.0 standard drinks    Comment: one drink once a month  . Drug use: No  . Sexual activity: Not on file  Lifestyle  . Physical activity:    Days per week: Not on file    Minutes per session: Not on file  . Stress: Not on file  Relationships  . Social connections:    Talks on phone: Not on file    Gets together: Not on file    Attends religious service: Not on file    Active member of club or organization: Not on file    Attends meetings of clubs or organizations: Not on file    Relationship status: Not on file  . Intimate partner violence:    Fear of current or ex partner: Not on file    Emotionally abused: Not on file    Physically abused: Not on file    Forced sexual activity: Not on file  Other Topics Concern  . Not on file  Social History Narrative   Lives at home alone   Drinks caffeine occasionally      Allergies  Allergen Reactions  . Crestor [Rosuvastatin Calcium] Other (See Comments)    Feeling very bad, aching all over.  . Erythromycin Hives  . Gabapentin Other (See Comments)    Blurred vision  . Pregabalin Other (See Comments)    Crossed vision.  Marland Kitchen Macrobid [Nitrofurantoin] Hives  . Losartan Itching    Family History  Problem Relation Age of Onset  . Prostate cancer Father   . Stroke Father        Prior to Admission medications   Medication Sig Start Date End Date Taking? Authorizing Provider  acetaminophen (TYLENOL) 500 MG tablet Take 500 mg by mouth every 6 (six) hours as  needed for mild pain.   Yes [provider]  atorvastatin (LIPITOR) 10 MG tablet Take 1 tablet (10 mg total) by mouth daily. 08/16/16  Yes [name], [name], MD  carbidopa-levodopa (SINEMET IR) 25-100 MG tablet 2 in the morning, 1 in the afternoon, 1 in the evening Patient taking differently: Take 1-2 tablets by mouth See admin instructions. Take 2 tablets in the morning then take 1 tablet  in the afternoon and take 1 tablet  in the evening 10/09/17  Yes Tat,  Eustace Quail, DO  Cholecalciferol (VITAMIN D3) 5000 UNITS CAPS Take 5,000 Units by mouth daily.    Yes [provider]  cyanocobalamin 500 MCG tablet Take 500 mcg by mouth daily.   Yes [provider]  ketoconazole (NIZORAL) 2 % shampoo Apply 1 application topically 2 (two) times a week. 04/23/17  Yes [name], [name], MD  magnesium oxide (MAG-OX) 400 (241.3 Mg) MG tablet Take 0.5 tablets (200 mg total) by mouth daily. 12/21/15  Yes Robbie Lis, MD  Melatonin 5 MG TABS Take 1 tablet by mouth at bedtime as needed (for sleep).   Yes [provider]  Menthol, Topical Analgesic, (BIOFREEZE EX) Apply 1 application topically daily as needed (for pain).   Yes [provider]  nicotine polacrilex (NICORETTE) 2 MG gum Take 2 mg by mouth as needed for smoking cessation.   Yes [provider]  SYNTHROID 150 MCG tablet Take 1 tablet (150 mcg total) by mouth daily before breakfast. 08/10/17  Yes [name], [name], MD  telmisartan (MICARDIS) 40 MG tablet Take 1 tablet (40 mg total) by mouth daily. 07/24/17  Yes [name], [name], MD  venlafaxine XR (EFFEXOR-XR) 150 MG 24 hr capsule Take 1 capsule (150 mg total) by mouth daily with breakfast. 04/23/17  Yes [name], [name], MD  warfarin (COUMADIN) 4 MG tablet Take 1 tablet (4 mg total) by mouth See admin instructions. Adjust as directed by MD Patient taking differently: Take 4 mg by mouth daily. Adjust as directed by MD 10/09/16  Yes [name], [name], MD    zolpidem (AMBIEN) 5 MG tablet Take 1 tablet (5 mg total) by mouth at bedtime as needed for sleep. Pt requests brand only. Do not combine with any other sedating substance 05/28/17  Yes [name], [name], MD     Physical Exam:  Vitals:   10/13/17 0230 10/13/17 0400 10/13/17 0515 10/13/17 0615  BP: 131/70 126/64 125/64 (!) 142/60  Pulse: 85 73 80 65  Resp: 20 15 14 19   Temp:    98 F (36.7 C)  TempSrc:    Oral  SpO2: 96% 96%  98%  Weight:    92 kg  Height:    5\' 6"  (1.676 m)   Constitutional: NAD, calm, comfortable, appears younger than stated age Eyes: PERRL, lids and conjunctivae normal ENMT: Mucous membranes are moist, without exudate or lesions  Neck: normal, supple, no masses, no thyromegaly Respiratory: clear to auscultation bilaterally, no wheezing, no crackles. Normal respiratory effort  Cardiovascular: Regular rate and rhythm,  murmur, rubs or gallops. No extremity edema. 2+ pedal pulses. No carotid bruits.  Abdomen: Soft, there is suprapubic tenderness, negative CVAT, No hepatosplenomegaly. Bowel sounds positive.  Musculoskeletal: no clubbing / cyanosis. Moves all extremities Skin: no jaundice, No lesions.  Neurologic: Sensation intact  Strength equal in all extremities.  Patient has a history of hard of hearing and glaucoma.  Known Parkinson's disease. Psychiatric:   Alert and oriented x 3. Normal mood.     Labs on Admission: I have personally reviewed following labs and imaging studies  CBC: Recent Labs  Lab 10/13/17 0134  WBC 7.1  HGB 13.5  HCT 41.8  MCV 91.7  PLT 408    Basic Metabolic Panel: Recent Labs  Lab 10/13/17 0134  NA 138  K 4.3  CL 106  CO2 23  GLUCOSE 141*  BUN 10  CREATININE 0.77  CALCIUM 8.8*    GFR: Estimated Creatinine Clearance: 70.5 mL/min (by C-G formula based  on SCr of 0.77 mg/dL).  Liver Function Tests: Recent Labs  Lab 10/13/17 0134  AST 22  ALT 6  ALKPHOS 115  BILITOT 0.7  PROT 5.6*  ALBUMIN 3.2*   No results  for input(s): LIPASE, AMYLASE in the last 168 hours. No results for input(s): AMMONIA in the last 168 hours.  Coagulation Profile: Recent Labs  Lab 10/13/17 0134  INR 2.22    Cardiac Enzymes: No results for input(s): CKTOTAL, CKMB, CKMBINDEX, TROPONINI in the last 168 hours.  BNP (last 3 results) No results for input(s): PROBNP in the last 8760 hours.  HbA1C: No results for input(s): HGBA1C in the last 72 hours.  CBG: No results for input(s): GLUCAP in the last 168 hours.  Lipid Profile: No results for input(s): CHOL, HDL, LDLCALC, TRIG, CHOLHDL, LDLDIRECT in the last 72 hours.  Thyroid Function Tests: No results for input(s): TSH, T4TOTAL, FREET4, T3FREE, THYROIDAB in the last 72 hours.  Anemia Panel: No results for input(s): VITAMINB12, FOLATE, FERRITIN, TIBC, IRON, RETICCTPCT in the last 72 hours.  Urine analysis:    Component Value Date/Time   COLORURINE AMBER (A) 10/13/2017 0126   APPEARANCEUR CLEAR 10/13/2017 0126   LABSPEC 1.010 10/13/2017 0126   PHURINE 6.0 10/13/2017 0126   GLUCOSEU NEGATIVE 10/13/2017 0126   GLUCOSEU NEGATIVE 08/14/2017 1541   HGBUR LARGE (A) 10/13/2017 0126   BILIRUBINUR NEGATIVE 10/13/2017 0126   BILIRUBINUR 3+ 10/02/2017 1031   KETONESUR NEGATIVE 10/13/2017 0126   PROTEINUR NEGATIVE 10/13/2017 0126   UROBILINOGEN 4.0 (A) 10/02/2017 1031   UROBILINOGEN 0.2 08/14/2017 1541   NITRITE POSITIVE (A) 10/13/2017 0126   LEUKOCYTESUR NEGATIVE 10/13/2017 0126    Sepsis Labs: @LABRCNTIP (procalcitonin:4,lacticidven:4) )No results found for this or any previous visit (from the past 240 hour(s)).   Radiological Exams on Admission: Ct Head Wo Contrast  Result Date: 10/13/2017 CLINICAL DATA:  Head trauma while on anti coagulation therapy EXAM: CT HEAD WITHOUT CONTRAST CT CERVICAL SPINE WITHOUT CONTRAST TECHNIQUE: Multidetector CT imaging of the head and cervical spine was performed following the standard protocol without intravenous contrast.  Multiplanar CT image reconstructions of the cervical spine were also generated. COMPARISON:  CT head and cervical spine 07/03/2017 FINDINGS: CT HEAD FINDINGS Brain: There is no mass, hemorrhage or extra-axial collection. There is generalized atrophy without lobar predilection. There is no acute or chronic infarction. There is hypoattenuation of the periventricular white matter, most commonly indicating chronic ischemic microangiopathy. Vascular: No abnormal hyperdensity of the major intracranial arteries or dural venous sinuses. No intracranial atherosclerosis. Skull: The visualized skull base, calvarium and extracranial soft tissues are normal. Sinuses/Orbits: No fluid levels or advanced mucosal thickening of the visualized paranasal sinuses. No mastoid or middle ear effusion. The orbits are normal. CT CERVICAL SPINE FINDINGS Alignment: No static subluxation. Facets are aligned. Occipital condyles are normally positioned. Skull base and vertebrae: No acute fracture. Multilevel height loss, greatest at the C5 inferior endplate. Soft tissues and spinal canal: No prevertebral fluid or swelling. No visible canal hematoma. Disc levels: There is left facet fusion from C2-C6. Upper chest: No pneumothorax, pulmonary nodule or pleural effusion. Other: Normal visualized paraspinal cervical soft tissues. IMPRESSION: 1. No acute intracranial abnormality. 2. No acute fracture of the cervical spine. 3. Advanced chronic microvascular disease and generalized atrophy of the brain. 4. Left C2-C6 left facet fusion. Electronically Signed   By: Ulyses Jarred M.D.   On: 10/13/2017 02:24   Ct Cervical Spine Wo Contrast  Result Date: 10/13/2017 CLINICAL DATA:  Head trauma  while on anti coagulation therapy EXAM: CT HEAD WITHOUT CONTRAST CT CERVICAL SPINE WITHOUT CONTRAST TECHNIQUE: Multidetector CT imaging of the head and cervical spine was performed following the standard protocol without intravenous contrast. Multiplanar CT image  reconstructions of the cervical spine were also generated. COMPARISON:  CT head and cervical spine 07/03/2017 FINDINGS: CT HEAD FINDINGS Brain: There is no mass, hemorrhage or extra-axial collection. There is generalized atrophy without lobar predilection. There is no acute or chronic infarction. There is hypoattenuation of the periventricular white matter, most commonly indicating chronic ischemic microangiopathy. Vascular: No abnormal hyperdensity of the major intracranial arteries or dural venous sinuses. No intracranial atherosclerosis. Skull: The visualized skull base, calvarium and extracranial soft tissues are normal. Sinuses/Orbits: No fluid levels or advanced mucosal thickening of the visualized paranasal sinuses. No mastoid or middle ear effusion. The orbits are normal. CT CERVICAL SPINE FINDINGS Alignment: No static subluxation. Facets are aligned. Occipital condyles are normally positioned. Skull base and vertebrae: No acute fracture. Multilevel height loss, greatest at the C5 inferior endplate. Soft tissues and spinal canal: No prevertebral fluid or swelling. No visible canal hematoma. Disc levels: There is left facet fusion from C2-C6. Upper chest: No pneumothorax, pulmonary nodule or pleural effusion. Other: Normal visualized paraspinal cervical soft tissues. IMPRESSION: 1. No acute intracranial abnormality. 2. No acute fracture of the cervical spine. 3. Advanced chronic microvascular disease and generalized atrophy of the brain. 4. Left C2-C6 left facet fusion. Electronically Signed   By: Ulyses Jarred M.D.   On: 10/13/2017 02:24   Ct Renal Stone Study  Result Date: 10/13/2017 CLINICAL DATA:  Status post fall, with generalized weakness and dizziness. Recently diagnosed with urinary tract infection. Headache and nausea. EXAM: CT ABDOMEN AND PELVIS WITHOUT CONTRAST TECHNIQUE: Multidetector CT imaging of the abdomen and pelvis was performed following the standard protocol without IV contrast.  COMPARISON:  CT of the abdomen and pelvis performed 05/25/2017 FINDINGS: Lower chest: Minimal bibasilar atelectasis is noted. Scattered coronary artery calcifications are seen. Hepatobiliary: The liver is unremarkable in appearance. The patient is status post cholecystectomy, with clips noted at the gallbladder fossa. The common bile duct remains normal in caliber. Pancreas: The pancreas is within normal limits. Spleen: The spleen is unremarkable in appearance. Adrenals/Urinary Tract: Right adrenal adenomas measure up to 1.9 cm in size. The left adrenal gland is unremarkable. Nonobstructing right renal stones measure up to 5 mm in size. Nonspecific perinephric stranding is noted bilaterally. There is no evidence of hydronephrosis. No obstructing ureteral stones are seen. Stomach/Bowel: The stomach is unremarkable in appearance. The small bowel is within normal limits. The patient is status post appendectomy. The colon is unremarkable in appearance. Vascular/Lymphatic: Diffuse calcification is seen along the abdominal aorta and its branches. The abdominal aorta is otherwise grossly unremarkable. The inferior vena cava is grossly unremarkable. No retroperitoneal lymphadenopathy is seen. No pelvic sidewall lymphadenopathy is identified. Reproductive: The bladder is mildly distended. Mild soft tissue inflammation about the bladder appears relatively stable. The patient is status post hysterectomy. No suspicious adnexal masses are seen. Other: No additional soft tissue abnormalities are seen. Musculoskeletal: No acute osseous abnormalities are identified. Vacuum phenomenon is noted at the lower lumbar spine. The visualized musculature is unremarkable in appearance. IMPRESSION: 1. No acute abnormality seen to explain the patient's symptoms. 2. Right adrenal adenomas measure up to 1.9 cm in size. 3. Scattered coronary artery calcifications seen. 4. Nonobstructing right renal stones measure up to 5 mm in size. Aortic  Atherosclerosis (ICD10-I70.0). Electronically Signed  By: Garald Balding M.D.   On: 10/13/2017 02:24    EKG: Independently reviewed.  Assessment/Plan Principal Problem:   Recurrent UTI Active Problems:   History of pulmonary embolism   Fibromyalgia syndrome   Thyroid disease   Parkinson's disease ([place])   Displaced fracture of fifth cervical vertebra ([facility])   Gait disturbance   Benign essential HTN   Hyperlipidemia   Hypercholesterolemia   Glaucoma   Transient cerebral ischemic attack   History of prediabetes    Recurrent UTI, with recent history of ESBL E. coli in urine culture in June 01/2018, treated with fosfomycin, likely nonsensitive.  Symptoms include symptoms of dysuria frequency, urgency UA + nitritesin urine. WBC is normal at 7.1. Received IV meropenem 1 g IV by pharmacy, and is to continue every 8 hours.Renal stone study on 10/13/2017 without acute abnormality to explain the symptoms, known right adrenal adenomas measuring up to 1.9 cm, known nonobstructing right renal stones up to 5 mm in size. Patient has been admitted for observation telemetry by night team  F/u urine culture Continue Merrem IV every 8 hours, she may need other oral coverage Follow CBC in am  IVF at 100 cc an hour Pyridium, as needed  Generalized weakness, lightheadedness, with subsequent fall, likely due to above.  No syncope or presyncope.  With negative CT of the head, no acute fracture of the cervical spine.  Currently, her vital signs are stable, she is afebrile.  She is receiving IV fluids. Continue IV fluids, IV antibiotics, Obtain PT and OT Avoid sedative overdose, and narcotic   Hypertension BP  142/60 Pulse 65  Continue home anti-hypertensive medications   Hyperlipidemia Continue home statins  Hypothyroidism: Continue home Synthroid  Glaucoma Continue eyedrops  Parkinson's disease with gait disturbance  Depression Continue Effexor, Sinemet OT/PT   Hisptry of PE/ HYpercoagulable  state History of TIA, remote Continue Chronic Coumadin per pharmacy  Prediadiabetes Current blood sugar level is 141 Lab Results  Component Value Date   HGBA1C 6.4 09/27/2017  CBG twice daily    DVT prophylaxis: Coumadin per pharmacy Code Status:    DNR Family Communication:  Discussed with patient Disposition Plan: Expect patient to be discharged to home after condition improves Consults called:    None Admission status: Telemetry obs   Sharene Butters, PA-C [facility]   Amion text  [phone]   10/13/2017, 6:48 AM

## 2017-10-13 NOTE — Progress Notes (Signed)
ANTICOAGULATION CONSULT NOTE - Initial Consult  Pharmacy Consult for warfarin Indication: pulmonary embolus, patient on chronic warfarin  Allergies  Allergen Reactions  . Crestor [Rosuvastatin Calcium] Other (See Comments)    Feeling very bad, aching all over.  . Erythromycin Hives  . Gabapentin Other (See Comments)    Blurred vision  . Pregabalin Other (See Comments)    Crossed vision.  Marland Kitchen Macrobid [Nitrofurantoin] Hives  . Losartan Itching    Patient Measurements: Height: 5\' 6"  (167.6 cm) Weight: 202 lb 13.2 oz (92 kg) IBW/kg (Calculated) : 59.3   Vital Signs: Temp: 98 F (36.7 C) (08/10 0615) Temp Source: Oral (08/10 0615) BP: 142/60 (08/10 0615) Pulse Rate: 65 (08/10 0615)  Labs: Recent Labs    10/13/17 0134  HGB 13.5  HCT 41.8  PLT 251  LABPROT 24.4*  INR 2.22  CREATININE 0.77    Estimated Creatinine Clearance: 70.5 mL/min (by C-G formula based on SCr of 0.77 mg/dL).   Medical History: Past Medical History:  Diagnosis Date  . Arthritis   . Chronic insomnia   . Complication of anesthesia    severe itching night of surgery requiring meds- also couldnt swallow- throat "paralyzed""  . Fibromyalgia   . Glaucoma   . Hypercholesterolemia   . Hyperlipidemia   . Hypertension    PCP Dr Cari Caraway- [place]  05/15/11 with clearance and note on chart,    chest x ray, EKG 12/12 EPIC, eccho 7/11 EPIC  . Hypothyroidism    s/p graves disease  . Kidney stone   . PD (Parkinson's disease) ([place])   . Peripheral vascular disease ([place])    PULMONARY EMBOLUS x 2- 2011, 2012/ FOLLOWED BY DR ODOGWU-LOV 12/12 EPIC   PT STAES WILL STOP COUMADIN 3/12 and begin LOVONOX 05/17/11 as previously instructed  . Prediabetes   . Sinus infection    at present- dr Honor Loh PA aware- was seen there and at PCP 05/10/11  . Thyroid disease    Hypothyroidism    Medications:  Scheduled:  . atorvastatin  10 mg Oral Daily  . carbidopa-levodopa  1 tablet Oral Q1400  . carbidopa-levodopa  1  tablet Oral QHS  . carbidopa-levodopa  2 tablet Oral QAC breakfast  . cholecalciferol  5,000 Units Oral Daily  . feeding supplement (ENSURE ENLIVE)  237 mL Oral BID BM  . irbesartan  150 mg Oral Daily  . levothyroxine  150 mcg Oral QAC breakfast  . magnesium oxide  200 mg Oral Daily  . venlafaxine XR  150 mg Oral Q breakfast  . Warfarin - Physician Dosing Inpatient   Does not apply q1800    Assessment: Patient [name] [name] is a 72 y/o female with a PMH of Pulmonary embolus with a hypercoagulable state, HTN, HLD, hypothyroidism, parkinson's disesase, angina and mild cognitive impairment. She is admitted for a UTI workup.  Patient is on warfarin PTA 4mg  daily.  Pharmacy consulted to dose warfarin during her stay at the hospital.  Patient's INR is within goal on 8/10. Will continue PTA dose for tonight  Goal of Therapy:  INR 2-3 Monitor platelets by anticoagulation protocol: Yes   Plan:  Warfarin 4mg  PO x1 tonight Daily INR and CBC Monitor for signs and symptoms of bleeding.  Thank you for allowing pharmacy to be a part of this patient's care.  Tamela Gammon, PharmD PGY1 Pharmacy Resident Direct Phone: [phone] 10/13/2017 8:44 AM

## 2017-10-13 NOTE — ED Triage Notes (Addendum)
Pt BIB [facility] for eval of fall w/ HA and nausea. Pt reports she took last dose of antibiotics for UTI today, but has been persistently weak and dizzy since diagnosis of UTI.  Pt reports she attempted to get OOB and use bathroom, felt weak, fell over into dresser and slid down. Pt denies LOC, remembers fall, endorses dizziness. Pt reports burning w/ urination for which she took 2 pyridium this afternoon. Pt currently anticoagulated on coumadin for hx of PE

## 2017-10-13 NOTE — Progress Notes (Signed)
Pharmacy Antibiotic Note  Shineka Auble is a 72 y.o. female admitted on 10/13/2017 with UTI w/ recent h/o ESBL urine cx.  Pharmacy has been consulted for Merrem dosing.  Plan: Merrem 1g IV Q8H.  Height: 5\' 6"  (167.6 cm) Weight: 196 lb 3.4 oz (89 kg) IBW/kg (Calculated) : 59.3  Temp (24hrs), Avg:97.9 F (36.6 C), Min:97.9 F (36.6 C), Max:97.9 F (36.6 C)  Recent Labs  Lab 10/13/17 0134  WBC 7.1  CREATININE 0.77    Estimated Creatinine Clearance: 69.3 mL/min (by C-G formula based on SCr of 0.77 mg/dL).    Allergies  Allergen Reactions  . Crestor [Rosuvastatin Calcium] Other (See Comments)    Feeling very bad, aching all over.  . Erythromycin Hives  . Gabapentin Other (See Comments)    Blurred vision  . Pregabalin Other (See Comments)    Crossed vision.  Marland Kitchen Macrobid [Nitrofurantoin] Hives  . Losartan Itching     Thank you for allowing pharmacy to be a part of this patient's care.  Wynona Neat, PharmD, BCPS  10/13/2017 5:20 AM

## 2017-10-13 NOTE — Evaluation (Addendum)
Occupational Therapy Evaluation Patient Details Name: [name] [name] MRN: [id number] DOB: 08-23-1945 Today's Date: 10/13/2017    History of Present Illness 72 y.o. female with medical history significant for hypertension, hyperlipidemia, hypothyroidism, Parkinson's disease, peripheral vascular disease, angina, mild cognitive impairment without a diagnosis of dementia, history of pulmonary embolism with hypercoagulable state, on chronic Coumadin, remote history of TIA, prediabetes, as well as a history of recurrent UTIs presents with generalized weakness, dizziness since the diagnosis of UTI.  The patient attempted to get out of bed and use the bathroom, feeling weak, and falling over into a dresser, but denying loss of consciousness.   Clinical Impression   Pt admitted with the above diagnoses and presents with below problem list. Pt will benefit from continued acute OT to address the below listed deficits and maximize independence with basic ADLs prior to d/c home. PTA pt was mod I with ADLs. Pt reports she has recently restarted PT/OT to address balance deficits. Pt is currently min guard to min A with LB ADLs and functional transfers/mobility.       Follow Up Recommendations  Home health OT, Intermittent supervision OOB/mobility   Equipment Recommendations  None recommended by OT    Recommendations for Other Services PT consult     Precautions / Restrictions Precautions Precautions: Fall Precaution Comments: "I tend to fall backwards." Restrictions Weight Bearing Restrictions: No      Mobility Bed Mobility Overal bed mobility: Needs Assistance Bed Mobility: Supine to Sit;Sit to Supine     Supine to sit: Min assist Sit to supine: Min guard   General bed mobility comments: assist to fully powerup  Transfers Overall transfer level: Needs assistance Equipment used: Rolling walker (2 wheeled) Transfers: Sit to/from Stand Sit to Stand: Min guard;Min assist         General  transfer comment: min A on intial stand. Pt noted to pull up on rw with both hands with therpist stabilizing rw.    Balance Overall balance assessment: Needs assistance Sitting-balance support: No upper extremity supported;Feet supported Sitting balance-Leahy Scale: Fair     Standing balance support: Bilateral upper extremity supported;No upper extremity supported;During functional activity Standing balance-Leahy Scale: Poor Standing balance comment: able to standing to wash hands at sink, external support for mobility and dynamic balance                           ADL either performed or assessed with clinical judgement   ADL Overall ADL's : Needs assistance/impaired Eating/Feeding: Set up;Sitting   Grooming: Min guard;Standing   Upper Body Bathing: Set up;Sitting   Lower Body Bathing: Minimal assistance;Sit to/from stand;Min guard   Upper Body Dressing : Set up;Sitting   Lower Body Dressing: Min guard;Minimal assistance;Sit to/from stand Lower Body Dressing Details (indicate cue type and reason): assist to don socks 2/2 back pain Toilet Transfer: Min guard;Ambulation;RW;Comfort height toilet;Grab bars   Toileting- Clothing Manipulation and Hygiene: Min guard;Sit to/from stand   Tub/ Shower Transfer: Min guard;Ambulation;3 in 1;Rolling walker   Functional mobility during ADLs: Min guard;Minimal assistance;Rolling walker General ADL Comments: Pt completed bed mobility, functional mobility in the room, toilet transfer, pericare and grooming task as detailed above     Vision         Perception     Praxis      Pertinent Vitals/Pain Pain Assessment: Faces Faces Pain Scale: Hurts little more Pain Location: back Pain Descriptors / Indicators: Aching Pain Intervention(s): Monitored during session;Limited activity  within patient's tolerance;Repositioned     Hand Dominance Right   Extremity/Trunk Assessment Upper Extremity Assessment Upper Extremity  Assessment: Generalized weakness   Lower Extremity Assessment Lower Extremity Assessment: Defer to PT evaluation       Communication Communication Communication: No difficulties   Cognition Arousal/Alertness: Awake/alert Behavior During Therapy: WFL for tasks assessed/performed;Flat affect Overall Cognitive Status: History of cognitive impairments - at baseline                                     General Comments       Exercises     Shoulder Instructions      Home Living Family/patient expects to be discharged to:: Private residence Living Arrangements: Alone Available Help at Discharge: Family;Available PRN/intermittently Type of Home: Independent living facility Home Access: Level entry     Home Layout: One level     Bathroom Shower/Tub: Occupational psychologist: Standard Bathroom Accessibility: Yes How Accessible: Accessible via walker Home Equipment: Bedside commode;Walker - 4 wheels;Walker - 2 wheels;Shower seat - built in;Grab bars - toilet;Grab bars - tub/shower          Prior Functioning/Environment Level of Independence: Independent with assistive device(s)        Comments: Reports she walks to dining hall with RW and manages ADLs independently        OT Problem List: Impaired balance (sitting and/or standing);Decreased knowledge of use of DME or AE;Decreased knowledge of precautions;Pain      OT Treatment/Interventions: Self-care/ADL training;DME and/or AE instruction;Therapeutic activities;Patient/family education;Balance training    OT Goals(Current goals can be found in the care plan section) Acute Rehab OT Goals Patient Stated Goal: go to my therapy appointments next week OT Goal Formulation: With patient Time For Goal Achievement: 10/20/17 Potential to Achieve Goals: Good ADL Goals Pt Will Perform Lower Body Bathing: with supervision;sit to/from stand Pt Will Perform Lower Body Dressing: with supervision;sit  to/from stand Pt Will Transfer to Toilet: with supervision;ambulating Pt Will Perform Toileting - Clothing Manipulation and hygiene: with supervision;sit to/from stand Pt Will Perform Tub/Shower Transfer: with supervision;ambulating;shower seat;rolling walker  OT Frequency: Min 2X/week   Barriers to D/C:            Co-evaluation              AM-PAC PT "6 Clicks" Daily Activity     Outcome Measure Help from another person eating meals?: None Help from another person taking care of personal grooming?: None Help from another person toileting, which includes using toliet, bedpan, or urinal?: A Little Help from another person bathing (including washing, rinsing, drying)?: A Little Help from another person to put on and taking off regular upper body clothing?: None Help from another person to put on and taking off regular lower body clothing?: A Little 6 Click Score: 21   End of Session Equipment Utilized During Treatment: Rolling walker  Activity Tolerance: Patient tolerated treatment well Patient left: in bed;with call bell/phone within reach;with bed alarm set  OT Visit Diagnosis: Other abnormalities of gait and mobility (R26.89);Pain;Muscle weakness (generalized) (M62.81)                Time: 3664-4034 OT Time Calculation (min): 22 min Charges:  OT General Charges $OT Visit: 1 Visit OT Evaluation $OT Eval Low Complexity: 1 Low    Hortencia Pilar 10/13/2017, 2:05 PM

## 2017-10-13 NOTE — ED Notes (Signed)
Report to [employer] opportunity for questions provided

## 2017-10-13 NOTE — ED Provider Notes (Signed)
[facility]ERG[facility] DEPARTMENT Provider Note   CSN: [id number] Arrival date & time: 10/13/17  0030     History   Chief Complaint Chief Complaint  Patient presents with  . Fall    HPI [name] [name] is a 72 y.o. female.  HPI Patient is a 72 year old female is brought to the emergency department after a fall today.  She was getting up this evening from her bed to go use the restroom when she fell backwards and struck her head against the bedside table.  She denies significant headache.  Denies significant neck pain.  Denies weakness of her arms or legs.  She is on Coumadin.  She reports she is currently being treated for urinary tract infection.  She is on her second course of antibiotics and continues to have some urinary frequency.  She has had UTIs complicated by ureteral stones before.  She denies significant abdominal pain or flank pain at this time.  She states that she felt lightheaded and dizzy today.  No fevers or chills.  She is had nausea and vomiting through the evening.  No bloody vomit.  No coffee-ground emesis described.  Denies diarrhea.  She continues to have some dysuria and urinary frequency.  Symptoms are mild to moderate in severity.  She denies pain in her hips knees or ankles.  Denies pain in her shoulders elbows or wrists.   Past Medical History:  Diagnosis Date  . Arthritis   . Chronic insomnia   . Complication of anesthesia    severe itching night of surgery requiring meds- also couldnt swallow- throat "paralyzed""  . Fibromyalgia   . Glaucoma   . Hypercholesterolemia   . Hyperlipidemia   . Hypertension    PCP Dr Cari Caraway- [place]  05/15/11 with clearance and note on chart,    chest x ray, EKG 12/12 EP[facility], eccho 7/11 EP[facility]  . Hypothyroidism    s/p graves disease  . Kidney stone   . PD (Parkinson's disease) ([place])   . Peripheral vascular disease ([place])    PULMONARY EMBOLUS x 2- 2011, 2012/ FOLLOWED BY DR [id number] 12/12 [facility]   PT STAES  WILL STOP COUMADIN 3/12 and begin LOVONOX 05/17/11 as previously instructed  . Prediabetes   . Sinus infection    at present- dr Honor Loh PA aware- was seen there and at PCP 05/10/11  . Thyroid disease    Hypothyroidism    Patient Active Problem List   Diagnosis Date Noted  . Sepsis secondary to UTI ([place]) 02/12/2017  . Elevated troponin 02/12/2017  . AKI (acute kidney injury) ([place]) 02/12/2017  . Recurrent UTI 11/23/2016  . Fall at nursing home 11/23/2016  . PD (Parkinson's disease) ([place]) 11/23/2016  . Sepsis due to urinary tract infection ([place]) 11/23/2016  . Fibromyalgia 11/23/2016  . Hypercholesterolemia 11/23/2016  . Hypertension 11/23/2016  . Hypothyroidism, adult 11/23/2016  . Elevated CK 11/23/2016  . Pulmonary HTN ([place]) 11/23/2016  . Left ventricular diastolic dysfunction 78/29/5621  . Metabolic encephalopathy 30/86/5784  . History of pulmonary embolus (PE) 2/2 hypercoagulable state 11/23/2016  . Sepsis ([place]) 09/17/2016  . Acute metabolic encephalopathy 69/62/9528  . Cellulitis of right foot 09/17/2016  . Fall   . UTI (urinary tract infection) 04/29/2016  . SIRS (systemic inflammatory response syndrome) ([place]) 04/29/2016  . Hyperlipidemia 04/29/2016  . Hypothyroidism 04/29/2016  . Hematuria 04/05/2016  . Cough 04/05/2016  . Benign essential HTN   . Intractable pain 12/17/2015  . Displaced fracture of fifth cervical vertebra ([place])  12/17/2015  . Gait disturbance 12/17/2015  . Trochanteric bursitis of both hips 09/13/2015  . Chronic low back pain 06/07/2015  . Fibromyalgia syndrome 05/17/2015  . Thyroid disease 05/17/2015  . Parkinson's disease ([place]) 05/17/2015  . Hypercoagulable state ([place]) 02/07/2012  . History of pulmonary embolism 02/07/2012  . S/P right TKA 05/22/2011    Past Surgical History:  Procedure Laterality Date  . ABDOMINAL HYSTERECTOMY    . APPENDECTOMY    . CATARACT EXTRACTION Bilateral   . CHOLECYSTECTOMY    . COLONOSCOPY    . EXPLORATORY  LAPAROTOMY    . JOINT REPLACEMENT     left knee  7/12  . TOTAL KNEE ARTHROPLASTY  05/22/2011   Procedure: TOTAL KNEE ARTHROPLASTY;  Surgeon: Mauri Pole, MD;  Location: [facility];  Service: Orthopedics;  Laterality: Right;     OB History   None      Home Medications    Prior to Admission medications   Medication Sig Start Date End Date Taking? Authorizing Provider  acetaminophen (TYLENOL) 500 MG tablet Take 500 mg by mouth every 6 (six) hours as needed for mild pain.    [provider]  atorvastatin (LIPITOR) 10 MG tablet Take 1 tablet (10 mg total) by mouth daily. 08/16/16   [name], [name], MD  carbidopa-levodopa (SINEMET IR) 25-100 MG tablet 2 in the morning, 1 in the afternoon, 1 in the evening 10/09/17   Tat, Eustace Quail, DO  Cholecalciferol (VITAMIN D3) 5000 UNITS CAPS Take 5,000 Units by mouth daily.     [provider]  cyanocobalamin 500 MCG tablet Take 500 mcg by mouth daily.    [provider]  ketoconazole (NIZORAL) 2 % shampoo Apply 1 application topically 2 (two) times a week. 04/23/17   [name], [name], MD  magnesium oxide (MAG-OX) 400 (241.3 Mg) MG tablet Take 0.5 tablets (200 mg total) by mouth daily. 12/21/15   Robbie Lis, MD  Melatonin 5 MG TABS Take 1 tablet by mouth at bedtime as needed (for sleep).    [provider]  Menthol, Topical Analgesic, (BIOFREEZE EX) Apply 1 application topically daily as needed (for pain).    [provider]  nicotine polacrilex (NICORETTE) 2 MG gum Take 2 mg by mouth as needed for smoking cessation.    [provider]  SYNTHROID 150 MCG tablet Take 1 tablet (150 mcg total) by mouth daily before breakfast. 08/10/17   [name], [name], MD  telmisartan (MICARDIS) 40 MG tablet Take 1 tablet (40 mg total) by mouth daily. 07/24/17   [name], [name], MD  venlafaxine XR (EFFEXOR-XR) 150 MG 24 hr capsule Take 1 capsule (150 mg total) by mouth daily with breakfast. 04/23/17   [name],  [name], MD  warfarin (COUMADIN) 4 MG tablet Take 1 tablet (4 mg total) by mouth See admin instructions. Adjust as directed by MD Patient taking differently: Take 4 mg by mouth daily. Adjust as directed by MD 10/09/16   [name], [name], MD  zolpidem (AMBIEN) 5 MG tablet Take 1 tablet (5 mg total) by mouth at bedtime as needed for sleep. Pt requests brand only. Do not combine with any other sedating substance 05/28/17   [name], [name], MD    Family History Family History  Problem Relation Age of Onset  . Prostate cancer Father   . Stroke Father     Social History Social History   Tobacco Use  . Smoking status: Former Smoker    Packs/day: 1.00  Last attempt to quit: 05/10/1989    Years since quitting: 28.4  . Smokeless tobacco: Never Used  Substance Use Topics  . Alcohol use: Yes    Alcohol/week: 0.0 standard drinks    Comment: one drink once a month  . Drug use: No     Allergies   Crestor [rosuvastatin calcium]; Erythromycin; Gabapentin; Pregabalin; Carbidopa; Macrobid [nitrofurantoin]; and Losartan   Review of Systems Review of Systems  All other systems reviewed and are negative.    Physical Exam Updated Vital Signs BP 132/64 (BP Location: Right Arm)   Pulse 85   Temp 97.9 F (36.6 C) (Oral)   Resp 20   Ht 5\' 6"  (1.676 m)   Wt 89 kg   SpO2 94%   BMI 31.67 kg/m   Physical Exam  Constitutional: She is oriented to person, place, and time. She appears well-developed and well-nourished. No distress.  HENT:  Head: Normocephalic and atraumatic.  Eyes: EOM are normal.  Neck: Neck supple.  No significant cervical spine tenderness or cervical step-off appreciated  Cardiovascular: Normal rate, regular rhythm and normal heart sounds.  Pulmonary/Chest: Effort normal and breath sounds normal.  Abdominal: Soft. She exhibits no distension. There is no tenderness.  Musculoskeletal: Normal range of motion.  Full range of motion of bilateral shoulders, elbows,  wrist.  Full range of motion of bilateral hips, knees, ankles.  5 out of 5 strength in bilateral upper and lower extremity major muscle groups.  Neurological: She is alert and oriented to person, place, and time.  Skin: Skin is warm and dry.  Psychiatric: She has a normal mood and affect. Judgment normal.  Nursing note and vitals reviewed.    ED Treatments / Results  Labs (all labs ordered are listed, but only abnormal results are displayed) Labs Reviewed  COMPREHENSIVE METABOLIC PANEL - Abnormal; Notable for the following components:      Result Value   Glucose, Bld 141 (*)    Calcium 8.8 (*)    Total Protein 5.6 (*)    Albumin 3.2 (*)    All other components within normal limits  URINALYSIS, ROUTINE W REFLEX MICROSCOPIC - Abnormal; Notable for the following components:   Color, Urine AMBER (*)    Hgb urine dipstick LARGE (*)    Nitrite POSITIVE (*)    RBC / HPF >50 (*)    Bacteria, UA RARE (*)    All other components within normal limits  PROTIME-INR - Abnormal; Notable for the following components:   Prothrombin Time 24.4 (*)    All other components within normal limits  URINE CULTURE  CBC    EKG None  Radiology Ct Head Wo Contrast  Result Date: 10/13/2017 CLINICAL DATA:  Head trauma while on anti coagulation therapy EXAM: CT HEAD WITHOUT CONTRAST CT CERVICAL SPINE WITHOUT CONTRAST TECHNIQUE: Multidetector CT imaging of the head and cervical spine was performed following the standard protocol without intravenous contrast. Multiplanar CT image reconstructions of the cervical spine were also generated. COMPARISON:  CT head and cervical spine 07/03/2017 FINDINGS: CT HEAD FINDINGS Brain: There is no mass, hemorrhage or extra-axial collection. There is generalized atrophy without lobar predilection. There is no acute or chronic infarction. There is hypoattenuation of the periventricular white matter, most commonly indicating chronic ischemic microangiopathy. Vascular: No abnormal  hyperdensity of the major intracranial arteries or dural venous sinuses. No intracranial atherosclerosis. Skull: The visualized skull base, calvarium and extracranial soft tissues are normal. Sinuses/Orbits: No fluid levels or advanced mucosal thickening of the  visualized paranasal sinuses. No mastoid or middle ear effusion. The orbits are normal. CT CERVICAL SPINE FINDINGS Alignment: No static subluxation. Facets are aligned. Occipital condyles are normally positioned. Skull base and vertebrae: No acute fracture. Multilevel height loss, greatest at the C5 inferior endplate. Soft tissues and spinal canal: No prevertebral fluid or swelling. No visible canal hematoma. Disc levels: There is left facet fusion from C2-C6. Upper chest: No pneumothorax, pulmonary nodule or pleural effusion. Other: Normal visualized paraspinal cervical soft tissues. IMPRESSION: 1. No acute intracranial abnormality. 2. No acute fracture of the cervical spine. 3. Advanced chronic microvascular disease and generalized atrophy of the brain. 4. Left C2-C6 left facet fusion. Electronically Signed   By: Ulyses Jarred M.D.   On: 10/13/2017 02:24   Ct Cervical Spine Wo Contrast  Result Date: 10/13/2017 CLINICAL DATA:  Head trauma while on anti coagulation therapy EXAM: CT HEAD WITHOUT CONTRAST CT CERVICAL SPINE WITHOUT CONTRAST TECHNIQUE: Multidetector CT imaging of the head and cervical spine was performed following the standard protocol without intravenous contrast. Multiplanar CT image reconstructions of the cervical spine were also generated. COMPARISON:  CT head and cervical spine 07/03/2017 FINDINGS: CT HEAD FINDINGS Brain: There is no mass, hemorrhage or extra-axial collection. There is generalized atrophy without lobar predilection. There is no acute or chronic infarction. There is hypoattenuation of the periventricular white matter, most commonly indicating chronic ischemic microangiopathy. Vascular: No abnormal hyperdensity of the  major intracranial arteries or dural venous sinuses. No intracranial atherosclerosis. Skull: The visualized skull base, calvarium and extracranial soft tissues are normal. Sinuses/Orbits: No fluid levels or advanced mucosal thickening of the visualized paranasal sinuses. No mastoid or middle ear effusion. The orbits are normal. CT CERVICAL SPINE FINDINGS Alignment: No static subluxation. Facets are aligned. Occipital condyles are normally positioned. Skull base and vertebrae: No acute fracture. Multilevel height loss, greatest at the C5 inferior endplate. Soft tissues and spinal canal: No prevertebral fluid or swelling. No visible canal hematoma. Disc levels: There is left facet fusion from C2-C6. Upper chest: No pneumothorax, pulmonary nodule or pleural effusion. Other: Normal visualized paraspinal cervical soft tissues. IMPRESSION: 1. No acute intracranial abnormality. 2. No acute fracture of the cervical spine. 3. Advanced chronic microvascular disease and generalized atrophy of the brain. 4. Left C2-C6 left facet fusion. Electronically Signed   By: Ulyses Jarred M.D.   On: 10/13/2017 02:24   Ct Renal Stone Study  Result Date: 10/13/2017 CLINICAL DATA:  Status post fall, with generalized weakness and dizziness. Recently diagnosed with urinary tract infection. Headache and nausea. EXAM: CT ABDOMEN AND PELVIS WITHOUT CONTRAST TECHNIQUE: Multidetector CT imaging of the abdomen and pelvis was performed following the standard protocol without IV contrast. COMPARISON:  CT of the abdomen and pelvis performed 05/25/2017 FINDINGS: Lower chest: Minimal bibasilar atelectasis is noted. Scattered coronary artery calcifications are seen. Hepatobiliary: The liver is unremarkable in appearance. The patient is status post cholecystectomy, with clips noted at the gallbladder fossa. The common bile duct remains normal in caliber. Pancreas: The pancreas is within normal limits. Spleen: The spleen is unremarkable in appearance.  Adrenals/Urinary Tract: Right adrenal adenomas measure up to 1.9 cm in size. The left adrenal gland is unremarkable. Nonobstructing right renal stones measure up to 5 mm in size. Nonspecific perinephric stranding is noted bilaterally. There is no evidence of hydronephrosis. No obstructing ureteral stones are seen. Stomach/Bowel: The stomach is unremarkable in appearance. The small bowel is within normal limits. The patient is status post appendectomy. The colon is unremarkable  in appearance. Vascular/Lymphatic: Diffuse calcification is seen along the abdominal aorta and its branches. The abdominal aorta is otherwise grossly unremarkable. The inferior vena cava is grossly unremarkable. No retroperitoneal lymphadenopathy is seen. No pelvic sidewall lymphadenopathy is identified. Reproductive: The bladder is mildly distended. Mild soft tissue inflammation about the bladder appears relatively stable. The patient is status post hysterectomy. No suspicious adnexal masses are seen. Other: No additional soft tissue abnormalities are seen. Musculoskeletal: No acute osseous abnormalities are identified. Vacuum phenomenon is noted at the lower lumbar spine. The visualized musculature is unremarkable in appearance. IMPRESSION: 1. No acute abnormality seen to explain the patient's symptoms. 2. Right adrenal adenomas measure up to 1.9 cm in size. 3. Scattered coronary artery calcifications seen. 4. Nonobstructing right renal stones measure up to 5 mm in size. Aortic Atherosclerosis (ICD10-I70.0). Electronically Signed   By: Garald Balding M.D.   On: 10/13/2017 02:24    Procedures Procedures (including critical care time)  Medications Ordered in ED Medications  sodium chloride 0.9 % bolus 1,000 mL (has no administration in time range)  fentaNYL (SUBLIMAZE) injection 50 mcg (has no administration in time range)     Initial Impression / Assessment and Plan / ED Course  I have reviewed the triage vital signs and the  nursing notes.  Pertinent labs & imaging results that were available during my care of the patient were reviewed by me and considered in my medical decision making (see chart for details).    Culture results of her urine from June 2019 -E. Coli   AMOX/CLAVULANIC 16  Intermediate    AMPICILLIN >=32  Resistant1    AMPICILLIN/SULBACTAM >=32  Resistant    CEFAZOLIN >=64  Resistant2    CEFEPIME  Resistant    CEFTRIAXONE >=64  Resistant    CIPROFLOXACIN >=4  Resistant    ERTAPENEM <=0.5  Sensitive    GENTAMICIN <=1  Sensitive    IMIPENEM <=0.25  Sensitive    LEVOFLOXACIN >=8  Resistant    NITROFURANTOIN <=16  Sensitive    PIP/TAZO <=4  Sensitive    TOBRAMYCIN <=1  Sensitive    TRIMETH/SULFA >=320  Resistant3         1 Extended spectrum beta-lactamase (ESBL) producing      Patient with evidence of persistent UTI and a history of ESBL producing bacteria.  She will be admitted to the hospital for IV antibiotics.  CT imaging without acute traumatic pathology.   Final Clinical Impressions(s) / ED Diagnoses   Final diagnoses:  Injury of head, initial encounter  Acute cystitis without hematuria    ED Discharge Orders    None       Jola Schmidt, MD 10/13/17 [phone]

## 2017-10-13 NOTE — Care Management (Signed)
This is a no charge note  Pending admission per Dr. Griselda Miner  72 year old lady with a past medical history of UTI with ESBL, hypertension, hyperlipidemia, hypothyroidism, Parkinson's disease, PVD, PE on Coumadin, who presents with UTI.  Patient was treated with oral antibiotics for 2 weeks without any improvement.  Urinalysis positive for UTI.  Zosyn was given in ED, will switch to meropenem.  Patient is placed on telemetry bed of observation.   Ivor Costa, MD  [facility] Pager [phone]  If 7PM-7AM, please contact night-coverage [email or web address] Password [email or web address] 10/13/2017, 5:09 AM

## 2017-10-13 NOTE — Progress Notes (Signed)
[name] [name] was admitted to 5w15 via stretcher.  The patient was transferred from the stretcher to the bed without incident.  Due to the patient's history of falls and anticoagulation with coumadin, she was placed on a low bed.  Admission booklet given.  Call bell and telephone are within reach.  Explained to patient how to use call bell and telephone; patient indicated understanding.  Bed is in the lowest position and floor mats are in place.  The patient is alert and oriented x4 and reports having neck pain and a headache.  Tylenol 650mg  PO given.  Patient denies any additional needs at this time.  Will continue to monitor.

## 2017-10-13 NOTE — Discharge Instructions (Addendum)
Stop your current antibiotic.   Start the [facility] as prescribed   Information on my medicine - Coumadin   (Warfarin)  This medication education was reviewed with me or my healthcare representative as part of my discharge preparation.  The pharmacist that spoke with me during my hospital stay was:  Saundra Shelling, [facility]  Why was Coumadin prescribed for you? Coumadin was prescribed for you because you have a blood clot or a medical condition that can cause an increased risk of forming blood clots. Blood clots can cause serious health problems by blocking the flow of blood to the heart, lung, or brain. Coumadin can prevent harmful blood clots from forming. As a reminder your indication for Coumadin is:   Pulmonary Embolism Treatment  What test will check on my response to Coumadin? While on Coumadin (warfarin) you will need to have an INR test regularly to ensure that your dose is keeping you in the desired range. The INR (international normalized ratio) number is calculated from the result of the laboratory test called prothrombin time (PT).  If an INR APPOINTMENT HAS NOT ALREADY BEEN MADE FOR YOU please schedule an appointment to have this lab work done by your health care provider within 7 days. Your INR goal is usually a number between:  2 to 3 or your provider may give you a more narrow range like 2-2.5.  Ask your health care provider during an office visit what your goal INR is.  What  do you need to  know  About  COUMADIN? Take Coumadin (warfarin) exactly as prescribed by your healthcare provider about the same time each day.  DO NOT stop taking without talking to the doctor who prescribed the medication.  Stopping without other blood clot prevention medication to take the place of Coumadin may increase your risk of developing a new clot or stroke.  Get refills before you run out.  What do you do if you miss a dose? If you miss a dose, take it as soon as you remember on the same day then  continue your regularly scheduled regimen the next day.  Do not take two doses of Coumadin at the same time.  Important Safety Information A possible side effect of Coumadin (Warfarin) is an increased risk of bleeding. You should call your healthcare provider right away if you experience any of the following: ? Bleeding from an injury or your nose that does not stop. ? Unusual colored urine (red or dark brown) or unusual colored stools (red or black). ? Unusual bruising for unknown reasons. ? A serious fall or if you hit your head (even if there is no bleeding).  Some foods or medicines interact with Coumadin (warfarin) and might alter your response to warfarin. To help avoid this: ? Eat a balanced diet, maintaining a consistent amount of Vitamin K. ? Notify your provider about major diet changes you plan to make. ? Avoid alcohol or limit your intake to 1 drink for women and 2 drinks for men per day. (1 drink is 5 oz. wine, 12 oz. beer, or 1.5 oz. liquor.)  Make sure that ANY health care provider who prescribes medication for you knows that you are taking Coumadin (warfarin).  Also make sure the healthcare provider who is monitoring your Coumadin knows when you have started a new medication including herbals and non-prescription products.  Coumadin (Warfarin)  Major Drug Interactions  Increased Warfarin Effect Decreased Warfarin Effect  Alcohol (large quantities) Antibiotics (esp. Septra/Bactrim, Flagyl, Cipro)  Amiodarone (Cordarone) Aspirin (ASA) Cimetidine (Tagamet) Megestrol (Megace) NSAIDs (ibuprofen, naproxen, etc.) Piroxicam (Feldene) Propafenone (Rythmol SR) Propranolol (Inderal) Isoniazid (INH) Posaconazole (Noxafil) Barbiturates (Phenobarbital) Carbamazepine (Tegretol) Chlordiazepoxide (Librium) Cholestyramine (Questran) Griseofulvin Oral Contraceptives Rifampin Sucralfate (Carafate) Vitamin K   Coumadin (Warfarin) Major Herbal Interactions  Increased Warfarin  Effect Decreased Warfarin Effect  Garlic Ginseng Ginkgo biloba Coenzyme Q10 Green tea St. Johns wort    Coumadin (Warfarin) FOOD Interactions  Eat a consistent number of servings per week of foods HIGH in Vitamin K (1 serving =  cup)  Collards (cooked, or boiled & drained) Kale (cooked, or boiled & drained) Mustard greens (cooked, or boiled & drained) Parsley *serving size only =  cup Spinach (cooked, or boiled & drained) Swiss chard (cooked, or boiled & drained) Turnip greens (cooked, or boiled & drained)  Eat a consistent number of servings per week of foods MEDIUM-HIGH in Vitamin K (1 serving = 1 cup)  Asparagus (cooked, or boiled & drained) Broccoli (cooked, boiled & drained, or raw & chopped) Brussel sprouts (cooked, or boiled & drained) *serving size only =  cup Lettuce, raw (green leaf, endive, romaine) Spinach, raw Turnip greens, raw & chopped   These websites have more information on Coumadin (warfarin):  FailFactory.se; VeganReport.com.au;

## 2017-10-14 LAB — GLUCOSE, CAPILLARY
GLUCOSE-CAPILLARY: 115 mg/dL — AB (ref 70–99)
Glucose-Capillary: 112 mg/dL — ABNORMAL HIGH (ref 70–99)
Glucose-Capillary: 163 mg/dL — ABNORMAL HIGH (ref 70–99)
Glucose-Capillary: 99 mg/dL (ref 70–99)

## 2017-10-14 LAB — CBC
HCT: 40.3 % (ref 36.0–46.0)
Hemoglobin: 12.8 g/dL (ref 12.0–15.0)
MCH: 29.1 pg (ref 26.0–34.0)
MCHC: 31.8 g/dL (ref 30.0–36.0)
MCV: 91.6 fL (ref 78.0–100.0)
PLATELETS: 229 10*3/uL (ref 150–400)
RBC: 4.4 MIL/uL (ref 3.87–5.11)
RDW: 12.5 % (ref 11.5–15.5)
WBC: 5.6 10*3/uL (ref 4.0–10.5)

## 2017-10-14 LAB — BASIC METABOLIC PANEL
ANION GAP: 6 (ref 5–15)
BUN: 9 mg/dL (ref 8–23)
CALCIUM: 8.6 mg/dL — AB (ref 8.9–10.3)
CO2: 27 mmol/L (ref 22–32)
CREATININE: 0.79 mg/dL (ref 0.44–1.00)
Chloride: 107 mmol/L (ref 98–111)
GLUCOSE: 119 mg/dL — AB (ref 70–99)
Potassium: 4.3 mmol/L (ref 3.5–5.1)
Sodium: 140 mmol/L (ref 135–145)

## 2017-10-14 LAB — PROTIME-INR
INR: 2.11
PROTHROMBIN TIME: 23.4 s — AB (ref 11.4–15.2)

## 2017-10-14 MED ORDER — WARFARIN SODIUM 4 MG PO TABS
4.0000 mg | ORAL_TABLET | Freq: Every day | ORAL | Status: DC
  Administered 2017-10-14: 4 mg via ORAL
  Filled 2017-10-14 (×2): qty 1

## 2017-10-14 MED ORDER — WARFARIN - PHARMACIST DOSING INPATIENT
Freq: Every day | Status: DC
  Administered 2017-10-16: 17:00:00

## 2017-10-14 MED ORDER — TRIAMCINOLONE ACETONIDE 0.025 % EX CREA
TOPICAL_CREAM | Freq: Two times a day (BID) | CUTANEOUS | Status: DC
  Administered 2017-10-14 – 2017-10-16 (×5): via TOPICAL
  Filled 2017-10-14: qty 15

## 2017-10-14 NOTE — Progress Notes (Signed)
PROGRESS NOTE    [name] [name]  YIR:[id number] DOB: [birth year] DOA: 10/13/2017 PCP: Darreld Mclean, MD   Brief Narrative:  72 year old female with a history of pulmonary hypertension, PE with hypercoagulable state, parkinsons, HTN, presents with a recurrent urinary tract infection.  In early June she was noted to have an ESBL positive E. coli and was treated with oral fosfomycin.  It does not appear that the fosfomycin was able to completely clear her infection she now presents with symptoms for many weeks and urinary evaluation consistent with recurrent/persistent infection.  Started on broad-spectrum antibiotics meropenem.  Assessment & Plan:   Principal Problem:   Recurrent UTI Active Problems:   History of pulmonary embolism   Fibromyalgia syndrome   Thyroid disease   Parkinson's disease ([place])   Displaced fracture of fifth cervical vertebra (HCC)   Gait disturbance   Benign essential HTN   Hyperlipidemia   Hypercholesterolemia   Glaucoma   Transient cerebral ischemic attack   History of prediabetes   Recurrent UTI- recent history of ESBL E. coli in urine culture in June 01/2018, unsuccessful treatment with fosfomycin. WBC is normal at 7.1. Renal stone study on 10/13/2017 without acute abnormality, known right adrenal adenomas measuring up to 1.9 cm, known nonobstructing right renal stones up to 5 mm in size. -IV meropenem per pharmacy to explain the symptoms -Pyridium, patient requesting Kenalog cream over the region-reports this has helped with irritation in the past, Will prescribe low strenght) -Follow-up blood and urine culture results  Generalized weakness, lightheadedness, with subsequent fall, likely due to above.  No LOC.  With negative CT of the head, no acute fracture of the cervical spine.   - D/c IVF - Obtain PT and OT-supervision intermittent, HHPT, return to  ALF.  Hypertension BP -  soft to stable - home ARB held  Hyperlipidemia Continue home  statins  Hypothyroidism: Continue home Synthroid  Glaucoma Continue eyedrops  Parkinson's disease with gait disturbance  Depression Continue Effexor, Sinemet  Hisptry of PE/ HYpercoagulable state, History of TIA, remote Continue Chronic Coumadin per pharmacy  Prediadiabetes Current blood sugar level is 141  DVT prophylaxis: Coumadin per pharm Code Status:DNR Family Communication: none at bedside Disposition Plan: Pending blood and urine culture results   Consultants:   none  Procedures: none  Antimicrobials: Meropenem 8/10 >>  Subjective: Doing okay today except for burning with urination.  Requesting Kenalog cream for vulvar region.  Reports this has helped in the past.  Objective: Vitals:   10/13/17 2151 10/14/17 0500 10/14/17 0617 10/14/17 1515  BP: (!) 121/59  (!) 142/72 95/68  Pulse: 78  81 89  Resp: 18  18 18   Temp: 98 F (36.7 C)  97.8 F (36.6 C) 98.1 F (36.7 C)  TempSrc: Oral  Oral Oral  SpO2: 95%  93% 96%  Weight:  95.7 kg    Height:        Intake/Output Summary (Last 24 hours) at 10/14/2017 1939 Last data filed at 10/14/2017 0626 Gross per 24 hour  Intake 1274.4 ml  Output 1700 ml  Net -425.6 ml   Filed Weights   10/13/17 0036 10/13/17 0615 10/14/17 0500  Weight: 89 kg 92 kg 95.7 kg    Examination:  General exam: Appears calm and comfortable  Respiratory system: Clear to auscultation. Respiratory effort normal. Cardiovascular system: S1 & S2 heard, RRR. No JVD, murmurs, rubs, gallops or clicks. No pedal edema. Gastrointestinal system: Abdomen is nondistended, soft and nontender. No organomegaly or masses felt. Normal  bowel sounds heard. Central nervous system: Alert and oriented. No focal neurological deficits. Extremities: Symmetric 5 x 5 power. Skin: No rashes, lesions or ulcers Psychiatry: Judgement and insight appear normal. Mood & affect appropriate.     Data Reviewed: I have personally reviewed following labs and imaging  studies  CBC: Recent Labs  Lab 10/13/17 0134 10/14/17 0440  WBC 7.1 5.6  HGB 13.5 12.8  HCT 41.8 40.3  MCV 91.7 91.6  PLT 251 425   Basic Metabolic Panel: Recent Labs  Lab 10/13/17 0134 10/14/17 0440  NA 138 140  K 4.3 4.3  CL 106 107  CO2 23 27  GLUCOSE 141* 119*  BUN 10 9  CREATININE 0.77 0.79  CALCIUM 8.8* 8.6*   GFR: Estimated Creatinine Clearance: 72 mL/min (by C-G formula based on SCr of 0.79 mg/dL). Liver Function Tests: Recent Labs  Lab 10/13/17 0134  AST 22  ALT 6  ALKPHOS 115  BILITOT 0.7  PROT 5.6*  ALBUMIN 3.2*   Coagulation Profile: Recent Labs  Lab 10/13/17 0134 10/14/17 0440  INR 2.22 2.11   CBG: Recent Labs  Lab 10/13/17 1650 10/14/17 0020 10/14/17 0613 10/14/17 1209 10/14/17 1818  GLUCAP 121* 112* 99 163* 115*    Recent Results (from the past 240 hour(s))  Urine culture     Status: Abnormal (Preliminary result)   Collection Time: 10/13/17  2:49 AM  Result Value Ref Range Status   Specimen Description URINE, CLEAN CATCH  Final   Special Requests NONE  Final   Culture (A)  Final    >=100,000 COLONIES/mL ESCHERICHIA COLI SUSCEPTIBILITIES TO FOLLOW Performed at [facility], [place] [address] [place], [place] [address]    Report Status PENDING  Incomplete  Culture, blood (Routine X 2) w Reflex to ID Panel     Status: None (Preliminary result)   Collection Time: 10/13/17  5:20 AM  Result Value Ref Range Status   Specimen Description BLOOD RIGHT HAND  Final   Special Requests   Final    BOTTLES DRAWN AEROBIC AND ANAEROBIC Blood Culture adequate volume   Culture   Final    NO GROWTH 1 DAY Performed at [facility], [place] [address], [place], [place] [address]    Report Status PENDING  Incomplete  Culture, blood (Routine X 2) w Reflex to ID Panel     Status: None (Preliminary result)   Collection Time: 10/13/17  5:30 AM  Result Value Ref Range Status   Specimen Description BLOOD LEFT HAND  Final   Special Requests    Final    BOTTLES DRAWN AEROBIC AND ANAEROBIC Blood Culture adequate volume   Culture   Final    NO GROWTH 1 DAY Performed at [facility], [place] [address] [place], [place] [address]    Report Status PENDING  Incomplete     Radiology Studies: Ct Head Wo Contrast  Result Date: 10/13/2017 CLINICAL DATA:  Head trauma while on anti coagulation therapy EXAM: CT HEAD WITHOUT CONTRAST CT CERVICAL SPINE WITHOUT CONTRAST TECHNIQUE: Multidetector CT imaging of the head and cervical spine was performed following the standard protocol without intravenous contrast. Multiplanar CT image reconstructions of the cervical spine were also generated. COMPARISON:  CT head and cervical spine 07/03/2017 FINDINGS: CT HEAD FINDINGS Brain: There is no mass, hemorrhage or extra-axial collection. There is generalized atrophy without lobar predilection. There is no acute or chronic infarction. There is hypoattenuation of the periventricular white matter, most commonly indicating chronic  ischemic microangiopathy. Vascular: No abnormal hyperdensity of the major intracranial arteries or dural venous sinuses. No intracranial atherosclerosis. Skull: The visualized skull base, calvarium and extracranial soft tissues are normal. Sinuses/Orbits: No fluid levels or advanced mucosal thickening of the visualized paranasal sinuses. No mastoid or middle ear effusion. The orbits are normal. CT CERVICAL SPINE FINDINGS Alignment: No static subluxation. Facets are aligned. Occipital condyles are normally positioned. Skull base and vertebrae: No acute fracture. Multilevel height loss, greatest at the C5 inferior endplate. Soft tissues and spinal canal: No prevertebral fluid or swelling. No visible canal hematoma. Disc levels: There is left facet fusion from C2-C6. Upper chest: No pneumothorax, pulmonary nodule or pleural effusion. Other: Normal visualized paraspinal cervical soft tissues. IMPRESSION: 1. No acute intracranial abnormality. 2. No  acute fracture of the cervical spine. 3. Advanced chronic microvascular disease and generalized atrophy of the brain. 4. Left C2-C6 left facet fusion. Electronically Signed   By: Ulyses Jarred M.D.   On: 10/13/2017 02:24   Ct Cervical Spine Wo Contrast  Result Date: 10/13/2017 CLINICAL DATA:  Head trauma while on anti coagulation therapy EXAM: CT HEAD WITHOUT CONTRAST CT CERVICAL SPINE WITHOUT CONTRAST TECHNIQUE: Multidetector CT imaging of the head and cervical spine was performed following the standard protocol without intravenous contrast. Multiplanar CT image reconstructions of the cervical spine were also generated. COMPARISON:  CT head and cervical spine 07/03/2017 FINDINGS: CT HEAD FINDINGS Brain: There is no mass, hemorrhage or extra-axial collection. There is generalized atrophy without lobar predilection. There is no acute or chronic infarction. There is hypoattenuation of the periventricular white matter, most commonly indicating chronic ischemic microangiopathy. Vascular: No abnormal hyperdensity of the major intracranial arteries or dural venous sinuses. No intracranial atherosclerosis. Skull: The visualized skull base, calvarium and extracranial soft tissues are normal. Sinuses/Orbits: No fluid levels or advanced mucosal thickening of the visualized paranasal sinuses. No mastoid or middle ear effusion. The orbits are normal. CT CERVICAL SPINE FINDINGS Alignment: No static subluxation. Facets are aligned. Occipital condyles are normally positioned. Skull base and vertebrae: No acute fracture. Multilevel height loss, greatest at the C5 inferior endplate. Soft tissues and spinal canal: No prevertebral fluid or swelling. No visible canal hematoma. Disc levels: There is left facet fusion from C2-C6. Upper chest: No pneumothorax, pulmonary nodule or pleural effusion. Other: Normal visualized paraspinal cervical soft tissues. IMPRESSION: 1. No acute intracranial abnormality. 2. No acute fracture of the  cervical spine. 3. Advanced chronic microvascular disease and generalized atrophy of the brain. 4. Left C2-C6 left facet fusion. Electronically Signed   By: Ulyses Jarred M.D.   On: 10/13/2017 02:24   Ct Renal Stone Study  Result Date: 10/13/2017 CLINICAL DATA:  Status post fall, with generalized weakness and dizziness. Recently diagnosed with urinary tract infection. Headache and nausea. EXAM: CT ABDOMEN AND PELVIS WITHOUT CONTRAST TECHNIQUE: Multidetector CT imaging of the abdomen and pelvis was performed following the standard protocol without IV contrast. COMPARISON:  CT of the abdomen and pelvis performed 05/25/2017 FINDINGS: Lower chest: Minimal bibasilar atelectasis is noted. Scattered coronary artery calcifications are seen. Hepatobiliary: The liver is unremarkable in appearance. The patient is status post cholecystectomy, with clips noted at the gallbladder fossa. The common bile duct remains normal in caliber. Pancreas: The pancreas is within normal limits. Spleen: The spleen is unremarkable in appearance. Adrenals/Urinary Tract: Right adrenal adenomas measure up to 1.9 cm in size. The left adrenal gland is unremarkable. Nonobstructing right renal stones measure up to 5 mm in size. Nonspecific perinephric  stranding is noted bilaterally. There is no evidence of hydronephrosis. No obstructing ureteral stones are seen. Stomach/Bowel: The stomach is unremarkable in appearance. The small bowel is within normal limits. The patient is status post appendectomy. The colon is unremarkable in appearance. Vascular/Lymphatic: Diffuse calcification is seen along the abdominal aorta and its branches. The abdominal aorta is otherwise grossly unremarkable. The inferior vena cava is grossly unremarkable. No retroperitoneal lymphadenopathy is seen. No pelvic sidewall lymphadenopathy is identified. Reproductive: The bladder is mildly distended. Mild soft tissue inflammation about the bladder appears relatively stable. The  patient is status post hysterectomy. No suspicious adnexal masses are seen. Other: No additional soft tissue abnormalities are seen. Musculoskeletal: No acute osseous abnormalities are identified. Vacuum phenomenon is noted at the lower lumbar spine. The visualized musculature is unremarkable in appearance. IMPRESSION: 1. No acute abnormality seen to explain the patient's symptoms. 2. Right adrenal adenomas measure up to 1.9 cm in size. 3. Scattered coronary artery calcifications seen. 4. Nonobstructing right renal stones measure up to 5 mm in size. Aortic Atherosclerosis (ICD10-I70.0). Electronically Signed   By: Garald Balding M.D.   On: 10/13/2017 02:24    Scheduled Meds: . atorvastatin  10 mg Oral Daily  . carbidopa-levodopa  1 tablet Oral Q1400  . carbidopa-levodopa  1 tablet Oral QHS  . carbidopa-levodopa  2 tablet Oral QAC breakfast  . cholecalciferol  5,000 Units Oral Daily  . feeding supplement (ENSURE ENLIVE)  237 mL Oral BID BM  . irbesartan  150 mg Oral Daily  . levothyroxine  150 mcg Oral QAC breakfast  . magnesium oxide  200 mg Oral Daily  . venlafaxine XR  150 mg Oral Q breakfast  . warfarin  4 mg Oral q1800  . Warfarin - Pharmacist Dosing Inpatient   Does not apply q1800   Continuous Infusions: . meropenem (MERREM) IV 1 g (10/14/17 1406)     LOS: 1 day   Bethena Roys, MD [facility] Pager [phone]  If 7PM-7AM, please contact night-coverage [email or web address] Password [facility] 10/14/2017, 7:39 PM

## 2017-10-14 NOTE — Evaluation (Signed)
Physical Therapy Evaluation Patient Details Name: [name] [name] MRN: [id number] DOB: May 01, 1945 Today's Date: 10/14/2017   History of Present Illness  72 y.o. female with medical history significant for hypertension, hyperlipidemia, hypothyroidism, Parkinson's disease, peripheral vascular disease, angina, mild cognitive impairment without a diagnosis of dementia, history of pulmonary embolism with hypercoagulable state, on chronic Coumadin, remote history of TIA, prediabetes, as well as a history of recurrent UTIs presents with generalized weakness, dizziness since the diagnosis of UTI.  The patient attempted to get out of bed and use the bathroom, feeling weak, and falling over into a dresser, but denying loss of consciousness.  Clinical Impression  Pt admitted with above diagnosis. Pt currently with functional limitations due to the deficits listed below (see PT Problem List). Pt is currently min A for bed mobility, and transfers to RW, and min guard for ambulation of 180 feet with RW. Pt reports working with OT/PT at her independent living facility on her balance and PT recommends she returns to that program at d/c.   Pt will benefit from skilled acute PT to increase their independence and safety with mobility until d/c.       Follow Up Recommendations Supervision - Intermittent;Home health PT(return to therapy program at her ALF)          Precautions / Restrictions Precautions Precautions: Fall Precaution Comments: multiple falls, mostly sliding out of bed with posterior lean  Restrictions Weight Bearing Restrictions: No      Mobility  Bed Mobility Overal bed mobility: Needs Assistance Bed Mobility: Supine to Sit     Supine to sit: Min assist     General bed mobility comments: pt pulled against PT with limited assist, minA for pad scoot of hips to EoB  Transfers Overall transfer level: Needs assistance Equipment used: Rolling walker (2 wheeled) Transfers: Sit to/from  Stand Sit to Stand: Min guard;Min assist         General transfer comment: min guard for safety, vc for hand placement, increased effort in powerup and steadying   Ambulation/Gait Ambulation/Gait assistance: Min guard Gait Distance (Feet): 180 Feet Assistive device: Rolling walker (2 wheeled) Gait Pattern/deviations: Step-through pattern;Shuffle;Decreased stride length;Festinating Gait velocity: WFL in straight aways,decreased with turns and close quarters Gait velocity interpretation: <1.8 ft/sec, indicate of risk for recurrent falls General Gait Details: min guard for safety, steady gait in straight line in hallway, increased time and festination with turning and walking between bed and wall to get to recliner.       Balance Overall balance assessment: Needs assistance Sitting-balance support: No upper extremity supported;Feet supported Sitting balance-Leahy Scale: Fair     Standing balance support: Bilateral upper extremity supported;No upper extremity supported;During functional activity Standing balance-Leahy Scale: Poor Standing balance comment: requires RW                             Pertinent Vitals/Pain Pain Assessment: 0-10 Pain Score: 4  Pain Location: back Pain Descriptors / Indicators: Aching Pain Intervention(s): Limited activity within patient's tolerance;Monitored during session;Repositioned    Home Living Family/patient expects to be discharged to:: Private residence Living Arrangements: Alone Available Help at Discharge: Family;Available PRN/intermittently Type of Home: Independent living facility Home Access: Level entry     Home Layout: One level Home Equipment: Bedside commode;Walker - 4 wheels;Walker - 2 wheels;Shower seat - built in;Grab bars - toilet;Grab bars - tub/shower      Prior Function Level of Independence: Independent with assistive device(s)  Comments: Reports she walks to dining hall with RW and manages ADLs  independently     Hand Dominance   Dominant Hand: Right    Extremity/Trunk Assessment   Upper Extremity Assessment Upper Extremity Assessment: Generalized weakness    Lower Extremity Assessment Lower Extremity Assessment: Generalized weakness       Communication   Communication: No difficulties  Cognition Arousal/Alertness: Awake/alert Behavior During Therapy: WFL for tasks assessed/performed;Flat affect Overall Cognitive Status: History of cognitive impairments - at baseline                                        General Comments General comments (skin integrity, edema, etc.): VSS        Assessment/Plan    PT Assessment Patient needs continued PT services  PT Problem List Decreased balance;Decreased mobility;Decreased cognition;Decreased activity tolerance;Decreased strength       PT Treatment Interventions DME instruction;Gait training;Stair training;Functional mobility training;Therapeutic activities;Therapeutic exercise;Balance training;Cognitive remediation;Patient/family education;Neuromuscular re-education    PT Goals (Current goals can be found in the Care Plan section)  Acute Rehab PT Goals Patient Stated Goal: go to my therapy appointments next week PT Goal Formulation: With patient Time For Goal Achievement: 10/28/17 Potential to Achieve Goals: Fair    Frequency Min 3X/week    AM-PAC PT "6 Clicks" Daily Activity  Outcome Measure Difficulty turning over in bed (including adjusting bedclothes, sheets and blankets)?: A Little Difficulty moving from lying on back to sitting on the side of the bed? : Unable Difficulty sitting down on and standing up from a chair with arms (e.g., wheelchair, bedside commode, etc,.)?: Unable Help needed moving to and from a bed to chair (including a wheelchair)?: A Little Help needed walking in hospital room?: A Little Help needed climbing 3-5 steps with a railing? : A Lot 6 Click Score: 13    End of  Session Equipment Utilized During Treatment: Gait belt Activity Tolerance: Patient tolerated treatment well Patient left: in chair;with call bell/phone within reach;with chair alarm set Nurse Communication: Mobility status PT Visit Diagnosis: Other abnormalities of gait and mobility (R26.89);Muscle weakness (generalized) (M62.81);Repeated falls (R29.6);History of falling (Z91.81);Difficulty in walking, not elsewhere classified (R26.2);Other symptoms and signs involving the nervous system (X83.382)    Time: 5053-9767 PT Time Calculation (min) (ACUTE ONLY): 32 min   Charges:     PT Treatments $Gait Training: 8-22 mins $Therapeutic Activity: 8-22 mins        Terez Freimark B. Migdalia Dk PT, DPT Acute Rehabilitation  [phone] Pager [phone]    [place] 10/14/2017, 6:10 PM

## 2017-10-14 NOTE — Progress Notes (Signed)
ANTICOAGULATION CONSULT NOTE - Initial Consult  Pharmacy Consult for warfarin Indication: pulmonary embolus, patient on chronic warfarin  Allergies  Allergen Reactions  . Crestor [Rosuvastatin Calcium] Other (See Comments)    Feeling very bad, aching all over.  . Erythromycin Hives  . Gabapentin Other (See Comments)    Blurred vision  . Pregabalin Other (See Comments)    Crossed vision.  Marland Kitchen Macrobid [Nitrofurantoin] Hives  . Losartan Itching    Patient Measurements: Height: 5\' 6"  (167.6 cm) Weight: 211 lb (95.7 kg) IBW/kg (Calculated) : 59.3   Vital Signs: Temp: 97.8 F (36.6 C) (08/11 0617) Temp Source: Oral (08/11 0617) BP: 142/72 (08/11 0617) Pulse Rate: 81 (08/11 0617)  Labs: Recent Labs    10/13/17 0134 10/14/17 0440  HGB 13.5 12.8  HCT 41.8 40.3  PLT 251 229  LABPROT 24.4* 23.4*  INR 2.22 2.11  CREATININE 0.77 0.79    Estimated Creatinine Clearance: 72 mL/min (by C-G formula based on SCr of 0.79 mg/dL).   Medical History: Past Medical History:  Diagnosis Date  . Arthritis   . Chronic insomnia   . Complication of anesthesia    severe itching night of surgery requiring meds- also couldnt swallow- throat "paralyzed""  . Fibromyalgia   . Glaucoma   . Hypercholesterolemia   . Hyperlipidemia   . Hypertension    PCP Dr Cari Caraway- [place]  05/15/11 with clearance and note on chart,    chest x ray, EKG 12/12 EP[facility], eccho 7/11 EP[facility]  . Hypothyroidism    s/p graves disease  . Kidney stone   . PD (Parkinson's disease) ([place])   . Peripheral vascular disease ([place])    PULMONARY EMBOLUS x 2- 2011, 2012/ FOLLOWED BY DR [id number] 12/12 EPIC   PT STAES WILL STOP COUMADIN 3/12 and begin LOVONOX 05/17/11 as previously instructed  . Prediabetes   . Sinus infection    at present- dr Honor Loh PA aware- was seen there and at PCP 05/10/11  . Thyroid disease    Hypothyroidism    Medications:  Scheduled:  . atorvastatin  10 mg Oral Daily  . carbidopa-levodopa  1 tablet  Oral Q1400  . carbidopa-levodopa  1 tablet Oral QHS  . carbidopa-levodopa  2 tablet Oral QAC breakfast  . cholecalciferol  5,000 Units Oral Daily  . feeding supplement (ENSURE ENLIVE)  237 mL Oral BID BM  . irbesartan  150 mg Oral Daily  . levothyroxine  150 mcg Oral QAC breakfast  . magnesium oxide  200 mg Oral Daily  . venlafaxine XR  150 mg Oral Q breakfast  . Warfarin - Physician Dosing Inpatient   Does not apply q1800    Assessment: Patient [name] [name] is a 72 y/o female with a PMH of Pulmonary embolus with a hypercoagulable state, HTN, HLD, hypothyroidism, parkinson's disesase, angina and mild cognitive impairment. She is admitted for a UTI workup.  Patient is on warfarin PTA 4mg  daily.  Pharmacy consulted to dose warfarin during her stay at the hospital.  Patient's INR is therapeutic again. We will continue her home dose and change her INR to MWF.  Goal of Therapy:  INR 2-3 Monitor platelets by anticoagulation protocol: Yes   Plan:  Warfarin 4mg  PO qday Change INR to MWF  Onnie Boer, PharmD, BCIDP, AAHIVP, CPP Infectious Disease Pharmacist Pager: [phone] 10/14/2017 12:41 PM

## 2017-10-15 ENCOUNTER — Ambulatory Visit: Payer: Medicare Other | Admitting: Endocrinology

## 2017-10-15 ENCOUNTER — Telehealth: Payer: Self-pay

## 2017-10-15 DIAGNOSIS — G20 Parkinson's disease: Secondary | ICD-10-CM

## 2017-10-15 DIAGNOSIS — Z1612 Extended spectrum beta lactamase (ESBL) resistance: Secondary | ICD-10-CM

## 2017-10-15 DIAGNOSIS — M797 Fibromyalgia: Secondary | ICD-10-CM

## 2017-10-15 DIAGNOSIS — I10 Essential (primary) hypertension: Secondary | ICD-10-CM

## 2017-10-15 DIAGNOSIS — B9629 Other Escherichia coli [E. coli] as the cause of diseases classified elsewhere: Secondary | ICD-10-CM

## 2017-10-15 DIAGNOSIS — R269 Unspecified abnormalities of gait and mobility: Secondary | ICD-10-CM

## 2017-10-15 DIAGNOSIS — N390 Urinary tract infection, site not specified: Secondary | ICD-10-CM

## 2017-10-15 DIAGNOSIS — Z86711 Personal history of pulmonary embolism: Secondary | ICD-10-CM

## 2017-10-15 LAB — PROTIME-INR
INR: 1.73
Prothrombin Time: 20.1 seconds — ABNORMAL HIGH (ref 11.4–15.2)

## 2017-10-15 LAB — GLUCOSE, CAPILLARY
GLUCOSE-CAPILLARY: 104 mg/dL — AB (ref 70–99)
GLUCOSE-CAPILLARY: 111 mg/dL — AB (ref 70–99)
GLUCOSE-CAPILLARY: 158 mg/dL — AB (ref 70–99)
Glucose-Capillary: 109 mg/dL — ABNORMAL HIGH (ref 70–99)

## 2017-10-15 LAB — URINE CULTURE: Culture: >=100000 — AB

## 2017-10-15 MED ORDER — WARFARIN SODIUM 6 MG PO TABS
6.0000 mg | ORAL_TABLET | Freq: Once | ORAL | Status: AC
  Administered 2017-10-15: 6 mg via ORAL
  Filled 2017-10-15: qty 1

## 2017-10-15 MED ORDER — SODIUM CHLORIDE 0.9 % IV SOLN
INTRAVENOUS | Status: DC | PRN
  Administered 2017-10-16: 500 mL via INTRAVENOUS

## 2017-10-15 NOTE — Progress Notes (Addendum)
PROGRESS NOTE    [name] [name]  ZSW:[id number] DOB: 01-08-1946 DOA: 10/13/2017 PCP: Darreld Mclean, MD   Brief Narrative:  72 year old female with a history of pulmonary hypertension, PE with hypercoagulable state, parkinsons, HTN, presents with a recurrent urinary tract infection.  In early June she was noted to have an ESBL positive E. Coli and was treated then. Again recently she developed UTI symptoms per the patient and was treated with oral fosfomycin for last 14 days PTA and po keflex for the last 5 days PTA.  She now presents with LUTS for many weeks and urinary evaluation consistent with recurrent/persistent infection.  Started on broad-spectrum antibiotics meropenem.  Assessment & Plan:   Principal Problem:   Recurrent UTI Active Problems:   History of pulmonary embolism   Fibromyalgia syndrome   Thyroid disease   Parkinson's disease ([place])   Displaced fracture of fifth cervical vertebra (HCC)   Gait disturbance   Benign essential HTN   Hyperlipidemia   Hypercholesterolemia   Glaucoma   Transient cerebral ischemic attack   History of prediabetes   Recurrent UTI- recent history of ESBL E. coli in urine culture in June 2019, and again recently started on Rx per PCP w/ 14 days fosfomycin and 5 day po keflex w the fosfomycin at the end.  Still had LUTS and was admitted and started on IV meropenem.  - have d/w ID phone consultation, they recommend 5d of IV Merrem , will cover this infection. Now on D#3/5 of Merrem - note that there was no objective evidence this time of actual infection (UTI 0-5 wbc's rare bacteria / no fever/ normal serum WBC).  Patient asking if her Parkinson's may be causing some of these problems.  Parkinson's is known to cause sig bladder/ voiding problems, but not sure if this could be the real issue.  She saw urology 1 time Dr McDiarmid last year, have scheduled her for f/u visit 10/25/17 at 10 am.  Could Parkinson's be causing her bladder symptoms and the  ESBL EColi is a colonizer?     Generalized weakness, lightheadedness, with subsequent fall, likely due to above.  No LOC.  With negative CT of the head, no acute fracture of the cervical spine.   - D/c IVF - seen by PT who recommends OK to return to  ALF w/ HHPT.    Hypertension BP -  soft to stable - home ARB held, resume when needed. BP's good  Hyperlipidemia Continue home statins  Hypothyroidism: Continue home Synthroid  Glaucoma Continue eyedrops  Parkinson's disease with gait disturbance  Depression Continue Effexor, Sinemet  Hisptry of PE/ HYpercoagulable state, History of TIA, remote Continue Chronic Coumadin per pharmacy  Prediadiabetes Current blood sugar level is [place] MD [place] pgr [phone] 07/29/2017, 9:25 AM    DVT prophylaxis: Coumadin per pharm Code Status: DNR Family Communication: none at bedside Disposition Plan: need 2-3 more days IV Merrem to complete course    Consultants:   none  Procedures: none  Antimicrobials: Meropenem 8/10 >>  Subjective: Doing okay today except for burning with urination.  Requesting Kenalog cream for vulvar region.  Reports this has helped in the past.  Objective: Vitals:   10/14/17 1515 10/14/17 2225 10/15/17 0500 10/15/17 0630  BP: 95/68 128/70  138/66  Pulse: 89 87  82  Resp: 18 17  18   Temp: 98.1 F (36.7 C) 98 F (36.7 C)  97.9 F (36.6 C)  TempSrc: Oral Oral  Oral  SpO2: 96% 94%  94%  Weight:   95.7 kg   Height:        Intake/Output Summary (Last 24 hours) at 10/15/2017 1222 Last data filed at 10/15/2017 2119 Gross per 24 hour  Intake 200 ml  Output 800 ml  Net -600 ml   Filed Weights   10/13/17 0615 10/14/17 0500 10/15/17 0500  Weight: 92 kg 95.7 kg 95.7 kg    Examination:  General exam: Appears calm and comfortable  Respiratory system: Clear to auscultation. Respiratory effort normal. Cardiovascular system: S1 & S2 heard, RRR. No JVD, murmurs,  rubs, gallops or clicks. No pedal edema. Gastrointestinal system: Abdomen is nondistended, soft and nontender. No organomegaly or masses felt. Normal bowel sounds heard. Central nervous system: Alert and oriented. No focal neurological deficits. Extremities: Symmetric 5 x 5 power. Skin: No rashes, lesions or ulcers Psychiatry: Judgement and insight appear normal. Mood & affect appropriate.     Data Reviewed: I have personally reviewed following labs and imaging studies  CBC: Recent Labs  Lab 10/13/17 0134 10/14/17 0440  WBC 7.1 5.6  HGB 13.5 12.8  HCT 41.8 40.3  MCV 91.7 91.6  PLT 251 417   Basic Metabolic Panel: Recent Labs  Lab 10/13/17 0134 10/14/17 0440  NA 138 140  K 4.3 4.3  CL 106 107  CO2 23 27  GLUCOSE 141* 119*  BUN 10 9  CREATININE 0.77 0.79  CALCIUM 8.8* 8.6*   GFR: Estimated Creatinine Clearance: 72 mL/min (by C-G formula based on SCr of 0.79 mg/dL). Liver Function Tests: Recent Labs  Lab 10/13/17 0134  AST 22  ALT 6  ALKPHOS 115  BILITOT 0.7  PROT 5.6*  ALBUMIN 3.2*   Coagulation Profile: Recent Labs  Lab 10/13/17 0134 10/14/17 0440 10/15/17 0441  INR 2.22 2.11 1.73   CBG: Recent Labs  Lab 10/14/17 0613 10/14/17 1209 10/14/17 1818 10/15/17 0025 10/15/17 0631  GLUCAP 99 163* 115* 111* 109*    Recent Results (from the past 240 hour(s))  Urine culture     Status: Abnormal   Collection Time: 10/13/17  2:49 AM  Result Value Ref Range Status   Specimen Description URINE, CLEAN CATCH  Final   Special Requests   Final    NONE Performed at [facility], [place] [address], [place], [place] [address]    Culture (A)  Final    >=100,000 COLONIES/mL ESCHERICHIA COLI Confirmed Extended Spectrum Beta-Lactamase Producer (ESBL).  In bloodstream infections from ESBL organisms, carbapenems are preferred over piperacillin/tazobactam. They are shown to have a lower risk of mortality.    Report Status 10/15/2017 FINAL  Final   Organism ID,  Bacteria ESCHERICHIA COLI (A)  Final      Susceptibility   Escherichia coli - MIC*    AMPICILLIN >=32 RESISTANT Resistant     CEFAZOLIN >=64 RESISTANT Resistant     CEFTRIAXONE >=64 RESISTANT Resistant     CIPROFLOXACIN >=4 RESISTANT Resistant     GENTAMICIN <=1 SENSITIVE Sensitive     IMIPENEM <=0.25 SENSITIVE Sensitive     NITROFURANTOIN <=16 SENSITIVE Sensitive     TRIMETH/SULFA >=320 RESISTANT Resistant     AMPICILLIN/SULBACTAM >=32 RESISTANT Resistant     PIP/TAZO <=4 SENSITIVE Sensitive     Extended ESBL POSITIVE Resistant     * >=100,000 COLONIES/mL ESCHERICHIA COLI  Culture, blood (Routine X 2) w Reflex to ID Panel     Status: None (Preliminary result)   Collection Time: 10/13/17  5:20 AM  Result  Value Ref Range Status   Specimen Description BLOOD RIGHT HAND  Final   Special Requests   Final    BOTTLES DRAWN AEROBIC AND ANAEROBIC Blood Culture adequate volume   Culture   Final    NO GROWTH 2 DAYS Performed at [facility], [address] [place], [place] [address]    Report Status PENDING  Incomplete  Culture, blood (Routine X 2) w Reflex to ID Panel     Status: None (Preliminary result)   Collection Time: 10/13/17  5:30 AM  Result Value Ref Range Status   Specimen Description BLOOD LEFT HAND  Final   Special Requests   Final    BOTTLES DRAWN AEROBIC AND ANAEROBIC Blood Culture adequate volume   Culture   Final    NO GROWTH 2 DAYS Performed at [facility], [place] [address], [place], [place] [address]    Report Status PENDING  Incomplete     Radiology Studies: No results found.  Scheduled Meds: . atorvastatin  10 mg Oral Daily  . carbidopa-levodopa  1 tablet Oral Q1400  . carbidopa-levodopa  1 tablet Oral QHS  . carbidopa-levodopa  2 tablet Oral QAC breakfast  . cholecalciferol  5,000 Units Oral Daily  . feeding supplement (ENSURE ENLIVE)  237 mL Oral BID BM  . levothyroxine  150 mcg Oral QAC breakfast  . magnesium oxide  200 mg Oral Daily  .  triamcinolone   Topical BID  . venlafaxine XR  150 mg Oral Q breakfast  . warfarin  6 mg Oral ONCE-1800  . Warfarin - Pharmacist Dosing Inpatient   Does not apply q1800   Continuous Infusions: . meropenem (MERREM) IV 1 g (10/15/17 0619)     LOS: 2 days

## 2017-10-15 NOTE — Progress Notes (Signed)
ANTICOAGULATION & ANTIBIOTIC CONSULT NOTE  Pharmacy Consult:  Coumadin + Merrem Indication: pulmonary embolus  + ESBL E.coli UTI  Allergies  Allergen Reactions  . Crestor [Rosuvastatin Calcium] Other (See Comments)    Feeling very bad, aching all over.  . Erythromycin Hives  . Gabapentin Other (See Comments)    Blurred vision  . Pregabalin Other (See Comments)    Crossed vision.  Marland Kitchen Macrobid [Nitrofurantoin] Hives  . Losartan Itching    Patient Measurements: Height: 5\' 6"  (167.6 cm) Weight: 211 lb (95.7 kg) IBW/kg (Calculated) : 59.3  Vital Signs: Temp: 97.9 F (36.6 C) (08/12 0630) Temp Source: Oral (08/12 0630) BP: 138/66 (08/12 0630) Pulse Rate: 82 (08/12 0630)  Labs: Recent Labs    10/13/17 0134 10/14/17 0440 10/15/17 0441  HGB 13.5 12.8  --   HCT 41.8 40.3  --   PLT 251 229  --   LABPROT 24.4* 23.4* 20.1*  INR 2.22 2.11 1.73  CREATININE 0.77 0.79  --     Estimated Creatinine Clearance: 72 mL/min (by C-G formula based on SCr of 0.79 mg/dL).   Assessment: 32 YOF with a PMH of pulmonary embolus and hypercoagulable state to continue on Coumadin from PTA.  INR sub-therapeutic today; no bleeding reported.  Home Coumadin dose:  4mg  PO daily    Pharmacy also managing Merrem for ESBL E.coli UTI.  Renal function stable, febrile, WBC WNL.  Merrem 8/9 >>  8/10 UCx - ESBL E.coli 8/10 BCx - NGTD   Goal of Therapy:  INR 2-3 Monitor platelets by anticoagulation protocol: Yes  Resolution of infection    Plan:  Coumadin 6mg  PO today Daily PT / INR  Continue Merrem 1gm IV Q8H Pharmacy will sign off and follow peripherally.  Thank you for the consult!   Ritisha Deitrick D. Mina Marble, PharmD, BCPS, [place] 10/15/2017, 8:22 AM

## 2017-10-15 NOTE — Telephone Encounter (Signed)
Ok done

## 2017-10-15 NOTE — Progress Notes (Signed)
Occupational Therapy Treatment Patient Details Name: [name] [name] MRN: [id number] DOB: 1945-10-16 Today's Date: 10/15/2017    History of present illness 72 y.o. female with medical history significant for hypertension, hyperlipidemia, hypothyroidism, Parkinson's disease, peripheral vascular disease, angina, mild cognitive impairment without a diagnosis of dementia, history of pulmonary embolism with hypercoagulable state, on chronic Coumadin, remote history of TIA, prediabetes, as well as a history of recurrent UTIs presents with generalized weakness, dizziness since the diagnosis of UTI.  The patient attempted to get out of bed and use the bathroom, feeling weak, and falling over into a dresser, but denying loss of consciousness.   OT comments  Pt making progress toward goals. Mobilizing with RW with [place] A. Requires Min A for LB ADL. Discussion regarding falls, which pt states usually occur from bed - falls posteriorly and her feet slide out from under her. Pt states she does not have anything on her feet as socks are difficult to put on. Pt also talked about recurrent UTIs and was questioning if wearing briefs/pads could have anything to do with recurrence.  In order to safely DC back to Independent Living apt, pt will have to be at a modified independent level. If pt is not safely at a modified independent level, she may need to go to short term rehab at [facility]. Will continue to follow acutely.   Follow Up Recommendations  Home health OT ; pending progress   Equipment Recommendations  None recommended by OT    Recommendations for Other Services      Precautions / Restrictions Precautions Precautions: Fall Precaution Comments: multiple falls, mostly sliding out of bed with posterior lean        Mobility Bed Mobility Overal bed mobility: Needs Assistance Bed Mobility: Supine to Sit     Supine to sit: Min assist Sit to supine: Supervision   General bed mobility comments: Against  to sit EOB. Pt staes she is usually able to get OOB safely on her own at home  Transfers Overall transfer level: Needs assistance Equipment used: Rolling walker (2 wheeled) Transfers: Sit to/from [facility] Sit to Stand: Supervision Stand pivot transfers: Min guard            Balance Overall balance assessment: History of Falls;Needs assistance   Sitting balance-Leahy Scale: Fair Sitting balance - Comments: 1 LOB EOB toward R     Standing balance-Leahy Scale: Poor Standing balance comment: dependent on external support                           ADL either performed or assessed with clinical judgement   ADL Overall ADL's : Needs assistance/impaired Eating/Feeding: Set up;Sitting   Grooming: Min guard;Standing               Lower Body Dressing: Minimal assistance;Sit to/from stand Lower Body Dressing Details (indicate cue type and reason): difficulty donning socks Toilet Transfer: Min guard;RW;Ambulation           Functional mobility during ADLs: Min guard;Rolling walker;Cueing for safety General ADL Comments: Pt states most of her falls occur in the bedroom falling on the floor from her bed. Pt has a call alert system and scoots her bottom to the call button. Educated pt on importance of WEARING the call button in case she falls somewhere other than the bedroom and is unable to scoot to the button due to injury. Pt verbalized understanding. Pt states she does her own medication management. So  in law states she has made mistakes before with medications.      Vision   Vision Assessment?: Vision impaired- to be further tested in functional context Additional Comments: reports occasional horizontal diplopia. States it usually occurs at night. She closes 1 eye to compensate      Perception     Praxis      Cognition Arousal/Alertness: Awake/alert Behavior During Therapy: WFL for tasks assessed/performed;Flat affect Overall Cognitive  Status: No family/caregiver present to determine baseline cognitive functioning                                 General Comments: Will further assess        Exercises     Shoulder Instructions       General Comments      Pertinent Vitals/ Pain       Pain Assessment: 0-10 Pain Score: 4  Pain Location: back Pain Descriptors / Indicators: Aching Pain Intervention(s): Limited activity within patient's tolerance  Home Living                                          Prior Functioning/Environment              Frequency  Min 3X/week        Progress Toward Goals  OT Goals(current goals can now be found in the care plan section)  Progress towards OT goals: Progressing toward goals  Acute Rehab OT Goals Patient Stated Goal: to go back to her apt OT Goal Formulation: With patient Time For Goal Achievement: 10/20/17 Potential to Achieve Goals: Good ADL Goals Pt Will Perform Lower Body Bathing: with supervision;sit to/from stand Pt Will Perform Lower Body Dressing: with supervision;sit to/from stand Pt Will Transfer to Toilet: with supervision;ambulating Pt Will Perform Toileting - Clothing Manipulation and hygiene: with supervision;sit to/from stand Pt Will Perform Tub/Shower Transfer: with supervision;ambulating;shower seat;rolling walker  Plan Discharge plan remains appropriate;Frequency needs to be updated    Co-evaluation                 AM-PAC PT "6 Clicks" Daily Activity     Outcome Measure   Help from another person eating meals?: None Help from another person taking care of personal grooming?: None Help from another person toileting, which includes using toliet, bedpan, or urinal?: A Little Help from another person bathing (including washing, rinsing, drying)?: A Little Help from another person to put on and taking off regular upper body clothing?: None Help from another person to put on and taking off regular lower  body clothing?: A Little 6 Click Score: 21    End of Session Equipment Utilized During Treatment: Rolling walker;Gait belt  OT Visit Diagnosis: Other abnormalities of gait and mobility (R26.89);Pain;Muscle weakness (generalized) (M62.81);History of falling (Z91.81) Pain - part of body: (back)   Activity Tolerance Patient tolerated treatment well   Patient Left in bed;with call bell/phone within reach;with bed alarm set;with family/visitor present   Nurse Communication Mobility status        Time: 2951-8841 OT Time Calculation (min): 40 min  Charges: OT General Charges $OT Visit: 1 Visit OT Treatments $Self Care/Home Management : 38-52 mins  Maurie Boettcher, OT/L  OT Clinical Specialist [phone]    [facility] 10/15/2017, 5:03 PM

## 2017-10-15 NOTE — Telephone Encounter (Signed)
Dr. Jonnie Finner from the ED calling, requesting for Dr. Lorelei Pont to place a referral for pt. to a urologist, as pt. has had recurrent UTI, cystitis, for which she was admitted this time for on 8/10. Pt. stated she saw a urologist at one point, but does not remember who, and it was difficult to find in her pt. History. Author told Dr. Jonnie Finner that message would be relayed to Dr. Lorelei Pont.

## 2017-10-16 DIAGNOSIS — E7800 Pure hypercholesterolemia, unspecified: Secondary | ICD-10-CM

## 2017-10-16 DIAGNOSIS — N3000 Acute cystitis without hematuria: Principal | ICD-10-CM

## 2017-10-16 DIAGNOSIS — Z87898 Personal history of other specified conditions: Secondary | ICD-10-CM

## 2017-10-16 LAB — BASIC METABOLIC PANEL
ANION GAP: 13 (ref 5–15)
BUN: 13 mg/dL (ref 8–23)
CALCIUM: 9.3 mg/dL (ref 8.9–10.3)
CO2: 25 mmol/L (ref 22–32)
Chloride: 101 mmol/L (ref 98–111)
Creatinine, Ser: 0.61 mg/dL (ref 0.44–1.00)
GFR calc Af Amer: >60 mL/min (ref >60)
GLUCOSE: 114 mg/dL — AB (ref 70–99)
POTASSIUM: 4.2 mmol/L (ref 3.5–5.1)
Sodium: 139 mmol/L (ref 135–145)

## 2017-10-16 LAB — GLUCOSE, CAPILLARY: Glucose-Capillary: 108 mg/dL — ABNORMAL HIGH (ref 70–99)

## 2017-10-16 LAB — PROTIME-INR
INR: 1.54
PROTHROMBIN TIME: 18.3 s — AB (ref 11.4–15.2)

## 2017-10-16 MED ORDER — WARFARIN SODIUM 6 MG PO TABS
6.0000 mg | ORAL_TABLET | Freq: Once | ORAL | Status: AC
Start: 2017-10-16 — End: 2017-10-16
  Administered 2017-10-16: 6 mg via ORAL
  Filled 2017-10-16: qty 1

## 2017-10-16 MED ORDER — SODIUM CHLORIDE 0.9 % IV SOLN
1.0000 g | Freq: Three times a day (TID) | INTRAVENOUS | Status: DC
  Administered 2017-10-16 – 2017-10-17 (×2): 1 g via INTRAVENOUS
  Filled 2017-10-16 (×3): qty 1

## 2017-10-16 NOTE — Progress Notes (Signed)
ANTICOAGULATION CONSULT NOTE  Pharmacy Consult:  Coumadin Indication: pulmonary embolus and hypercoagulable state   Allergies  Allergen Reactions  . Crestor [Rosuvastatin Calcium] Other (See Comments)    Feeling very bad, aching all over.  . Erythromycin Hives  . Gabapentin Other (See Comments)    Blurred vision  . Pregabalin Other (See Comments)    Crossed vision.  Marland Kitchen Macrobid [Nitrofurantoin] Hives  . Losartan Itching    Patient Measurements: Height: 5\' 6"  (167.6 cm) Weight: 202 lb (91.6 kg) IBW/kg (Calculated) : 59.3  Vital Signs: Temp: 98 F (36.7 C) (08/13 0611) BP: 141/52 (08/13 0611) Pulse Rate: 85 (08/13 0611)  Labs: Recent Labs    10/14/17 0440 10/15/17 0441 10/16/17 0323  HGB 12.8  --   --   HCT 40.3  --   --   PLT 229  --   --   LABPROT 23.4* 20.1* 18.3*  INR 2.11 1.73 1.54  CREATININE 0.79  --  0.61    Estimated Creatinine Clearance: 70.3 mL/min (by C-G formula based on SCr of 0.61 mg/dL).   Assessment: 78 YOF with a PMH of pulmonary embolus and hypercoagulable state to continue on Coumadin from PTA.  INR sub-therapeutic today; no bleeding reported.  Home Coumadin dose:  4mg  PO daily  Goal of Therapy:  INR 2-3 Monitor platelets by anticoagulation protocol: Yes    Plan:  Repeat Coumadin 6 mg PO today Daily PT / INR   Renold Genta, PharmD, BCPS Clinical Pharmacist Clinical phone for 10/16/2017 until 3p is [phone] Please check AMION for all Pharmacist numbers by unit 10/16/2017 1:14 PM

## 2017-10-16 NOTE — Progress Notes (Addendum)
PROGRESS NOTE    [name] [name]  POE:[id number] DOB: 1945-10-06 DOA: 10/13/2017 PCP: Darreld Mclean, MD   Brief Narrative:  72 year old female with a history of pulmonary hypertension, PE with hypercoagulable state, parkinsons, HTN, presents with a recurrent urinary tract infection.  In early June she was noted to have an ESBL positive E. Coli and was treated then. Again recently she developed UTI symptoms per the patient and was treated with oral fosfomycin for last 14 days PTA and po keflex for the last 5 days PTA.  She now presents with LUTS for many weeks and urinary evaluation consistent with recurrent/persistent infection.  Started on broad-spectrum antibiotics meropenem.  Assessment & Plan:   Principal Problem:   Recurrent UTI Active Problems:   History of pulmonary embolism   Fibromyalgia syndrome   Thyroid disease   Parkinson's disease ([place])   Displaced fracture of fifth cervical vertebra (HCC)   Gait disturbance   Benign essential HTN   Hyperlipidemia   Hypercholesterolemia   Glaucoma   Transient cerebral ischemic attack   History of prediabetes   Recurrent UTI- recent history of ESBL E. coli in urine culture in June 2019, and again recently started on Rx per PCP w/ 14 days fosfomycin and 5 day po keflex w the fosfomycin at the end.  Still had LUTS and was admitted and started on IV meropenem.  - spoke w ID, recommended 5d of IV meropenem, tomorrow is final day - ok for dc after 2nd (afternoon) dose of meropenem tomorrow - note that there was no objective evidence of actual infection (0-5 wbc's rare bacteria / no fever/ normal serum WBC) - patient asking if her Parkinson's may be causing some of these problems.  Parkinson's is known to cause sig bladder/ voiding problems, but not sure if this could be the real issue - pt saw Dr McDiarmid urology last year for voiding issues. I have scheduled her for f/u visit 10/25/17 at 10 am, see if bladder symptoms could be neurogenic  and not infectious  Generalized weakness, lightheadedness, with subsequent fall, likely due to above.  No LOC.  With negative CT of the head, no acute fracture of the cervical spine.   - D/c IVF - seen by PT who recommends OK to return to  ALF w/ HHPT.    Hypertension BP -  soft to stable - home ARB held, resume when needed. BP's good  Hyperlipidemia Continue home statins  Hypothyroidism: Continue home Synthroid  Glaucoma Continue eyedrops  Parkinson's disease with gait disturbance  Depression Continue Effexor, Sinemet  Hisptry of PE/ HYpercoagulable state, History of TIA, remote Continue Chronic Coumadin per pharmacy  Prediadiabetes Current blood sugar level is [place] MD [facility] pgr [phone] 07/29/2017, 9:25 AM    DVT prophylaxis: Coumadin per pharm Code Status: DNR Family Communication: none at bedside Disposition Plan: ok for dc Wed if stable, will return to her ALF   Consultants:   none  Procedures: none  Antimicrobials: Meropenem 8/10 >>  Subjective: Doing okay today , walked 240 feet w/ PT in the halls and w/ RW  Objective: Vitals:   10/15/17 2051 10/16/17 0500 10/16/17 0611 10/16/17 1326  BP: 127/71  (!) 141/52 (!) 138/58  Pulse: 88  85 88  Resp: 17  18 17   Temp: 98 F (36.7 C)  98 F (36.7 C) 98.7 F (37.1 C)  TempSrc: Oral   Oral  SpO2: 96%  94% 94%  Weight:  91.6 kg  Height:        Intake/Output Summary (Last 24 hours) at 10/16/2017 1610 Last data filed at 10/16/2017 1500 Gross per 24 hour  Intake 1122.02 ml  Output 2250 ml  Net -1127.98 ml   Filed Weights   10/14/17 0500 10/15/17 0500 10/16/17 0500  Weight: 95.7 kg 95.7 kg 91.6 kg    Examination:  General exam: Appears calm and comfortable  Respiratory system: Clear to auscultation. Respiratory effort normal. Cardiovascular system: S1 & S2 heard, RRR. No JVD, murmurs, rubs, gallops or clicks. No pedal edema. Gastrointestinal system:  Abdomen is nondistended, soft and nontender. No organomegaly or masses felt. Normal bowel sounds heard. Central nervous system: Alert and oriented. No focal neurological deficits. Extremities: Symmetric 5 x 5 power. Skin: No rashes, lesions or ulcers Psychiatry: Judgement and insight appear normal. Mood & affect appropriate.     Data Reviewed: I have personally reviewed following labs and imaging studies  CBC: Recent Labs  Lab 10/13/17 0134 10/14/17 0440  WBC 7.1 5.6  HGB 13.5 12.8  HCT 41.8 40.3  MCV 91.7 91.6  PLT 251 921   Basic Metabolic Panel: Recent Labs  Lab 10/13/17 0134 10/14/17 0440 10/16/17 0323  NA 138 140 139  K 4.3 4.3 4.2  CL 106 107 101  CO2 23 27 25   GLUCOSE 141* 119* 114*  BUN 10 9 13   CREATININE 0.77 0.79 0.61  CALCIUM 8.8* 8.6* 9.3   GFR: Estimated Creatinine Clearance: 70.3 mL/min (by C-G formula based on SCr of 0.61 mg/dL). Liver Function Tests: Recent Labs  Lab 10/13/17 0134  AST 22  ALT 6  ALKPHOS 115  BILITOT 0.7  PROT 5.6*  ALBUMIN 3.2*   Coagulation Profile: Recent Labs  Lab 10/13/17 0134 10/14/17 0440 10/15/17 0441 10/16/17 0323  INR 2.22 2.11 1.73 1.54   CBG: Recent Labs  Lab 10/15/17 0025 10/15/17 0631 10/15/17 1227 10/15/17 1647 10/16/17 0758  GLUCAP 111* 109* 158* 104* 108*    Recent Results (from the past 240 hour(s))  Urine culture     Status: Abnormal   Collection Time: 10/13/17  2:49 AM  Result Value Ref Range Status   Specimen Description URINE, CLEAN CATCH  Final   Special Requests   Final    NONE Performed at  [facility], [place] [address] [place], [place] [address]    Culture (A)  Final    >=100,000 COLONIES/mL ESCHERICHIA COLI Confirmed Extended Spectrum Beta-Lactamase Producer (ESBL).  In bloodstream infections from ESBL organisms, carbapenems are preferred over piperacillin/tazobactam. They are shown to have a lower risk of mortality.    Report Status 10/15/2017 FINAL  Final   Organism  ID, Bacteria ESCHERICHIA COLI (A)  Final      Susceptibility   Escherichia coli - MIC*    AMPICILLIN >=32 RESISTANT Resistant     CEFAZOLIN >=64 RESISTANT Resistant     CEFTRIAXONE >=64 RESISTANT Resistant     CIPROFLOXACIN >=4 RESISTANT Resistant     GENTAMICIN <=1 SENSITIVE Sensitive     IMIPENEM <=0.25 SENSITIVE Sensitive     NITROFURANTOIN <=16 SENSITIVE Sensitive     TRIMETH/SULFA >=320 RESISTANT Resistant     AMPICILLIN/SULBACTAM >=32 RESISTANT Resistant     PIP/TAZO <=4 SENSITIVE Sensitive     Extended ESBL POSITIVE Resistant     * >=100,000 COLONIES/mL ESCHERICHIA COLI  Culture, blood (Routine X 2) w Reflex to ID Panel     Status: None (Preliminary result)   Collection Time: 10/13/17  5:20 AM  Result  Value Ref Range Status   Specimen Description BLOOD RIGHT HAND  Final   Special Requests   Final    BOTTLES DRAWN AEROBIC AND ANAEROBIC Blood Culture adequate volume   Culture   Final    NO GROWTH 3 DAYS Performed at [facility], [address] [place], [place] [address]    Report Status PENDING  Incomplete  Culture, blood (Routine X 2) w Reflex to ID Panel     Status: None (Preliminary result)   Collection Time: 10/13/17  5:30 AM  Result Value Ref Range Status   Specimen Description BLOOD LEFT HAND  Final   Special Requests   Final    BOTTLES DRAWN AEROBIC AND ANAEROBIC Blood Culture adequate volume   Culture   Final    NO GROWTH 3 DAYS Performed at [facility], [place] [address], [place], [place] [address]    Report Status PENDING  Incomplete     Radiology Studies: No results found.  Scheduled Meds: . atorvastatin  10 mg Oral Daily  . carbidopa-levodopa  1 tablet Oral Q1400  . carbidopa-levodopa  1 tablet Oral QHS  . carbidopa-levodopa  2 tablet Oral QAC breakfast  . cholecalciferol  5,000 Units Oral Daily  . feeding supplement (ENSURE ENLIVE)  237 mL Oral BID BM  . levothyroxine  150 mcg Oral QAC breakfast  . magnesium oxide  200 mg Oral Daily  .  triamcinolone   Topical BID  . venlafaxine XR  150 mg Oral Q breakfast  . warfarin  6 mg Oral ONCE-1800  . Warfarin - Pharmacist Dosing Inpatient   Does not apply q1800   Continuous Infusions: . sodium chloride Stopped (10/16/17 1334)  . meropenem (MERREM) IV 1 g (10/16/17 1334)     LOS: 3 days

## 2017-10-16 NOTE — Progress Notes (Signed)
Physical Therapy Treatment Patient Details Name: [name] [name] MRN: [id number] DOB: Jun 29, 1945 Today's Date: 10/16/2017    History of Present Illness 72 y.o. female with medical history significant for hypertension, hyperlipidemia, hypothyroidism, Parkinson's disease, peripheral vascular disease, angina, mild cognitive impairment without a diagnosis of dementia, history of pulmonary embolism with hypercoagulable state, on chronic Coumadin, remote history of TIA, prediabetes, as well as a history of recurrent UTIs presents with generalized weakness, dizziness since the diagnosis of UTI.  The patient attempted to get out of bed and use the bathroom, feeling weak, and falling over into a dresser, but denying loss of consciousness.    PT Comments    Patient progressing well with mobility, improved overall activity tolerance with hallway ambulation today. Occasional cues for increased cadence to improve balance with good receptivity. Will continue to see and progress as tolerated. Current POC remains appropriate.   Follow Up Recommendations  Supervision - Intermittent;Home health PT(return to therapy program at her ALF)     Equipment Recommendations  None recommended by PT    Recommendations for Other Services       Precautions / Restrictions Precautions Precautions: Fall Precaution Comments: multiple falls, mostly sliding out of bed with posterior lean     Mobility  Bed Mobility Overal bed mobility: Needs Assistance Bed Mobility: Supine to Sit     Supine to sit: Min assist Sit to supine: Supervision   General bed mobility comments: Increased time and effort to perform, min assist for posterior LOB when attempting to elevate trunk to EOB  Transfers Overall transfer level: Needs assistance Equipment used: Rolling walker (2 wheeled) Transfers: Sit to/from Stand Sit to Stand: Supervision         General transfer comment: Supervision for safety, one cue for hand  placement  Ambulation/Gait Ambulation/Gait assistance: Supervision Gait Distance (Feet): 240 Feet Assistive device: Rolling walker (2 wheeled) Gait Pattern/deviations: Step-through pattern;Shuffle;Decreased stride length;Festinating   Gait velocity interpretation: 1.31 - 2.62 ft/sec, indicative of limited community ambulator General Gait Details: improved stability with ambulation, ocasional VCs for increased cadence, patient then able to internalize and self correct speed throughout remainder of ambulation   Stairs             Wheelchair Mobility    Modified Rankin (Stroke Patients Only)       Balance Overall balance assessment: History of Falls;Needs assistance   Sitting balance-Leahy Scale: Fair Sitting balance - Comments: posterior LOB initially while sitting EOB   Standing balance support: Bilateral upper extremity supported Standing balance-Leahy Scale: Poor Standing balance comment: reliance on RW                            Cognition Arousal/Alertness: Awake/alert Behavior During Therapy: WFL for tasks assessed/performed;Flat affect Overall Cognitive Status: No family/caregiver present to determine baseline cognitive functioning                                        Exercises      General Comments        Pertinent Vitals/Pain Pain Assessment: 0-10 Faces Pain Scale: Hurts little more Pain Location: back Pain Descriptors / Indicators: Sore Pain Intervention(s): Monitored during session    Home Living                      Prior Function  PT Goals (current goals can now be found in the care plan section) Acute Rehab PT Goals Patient Stated Goal: to go back to her apt PT Goal Formulation: With patient Time For Goal Achievement: 10/28/17 Potential to Achieve Goals: Fair Progress towards PT goals: Progressing toward goals    Frequency    Min 3X/week      PT Plan Current plan remains  appropriate    Co-evaluation              AM-PAC PT "6 Clicks" Daily Activity  Outcome Measure  Difficulty turning over in bed (including adjusting bedclothes, sheets and blankets)?: A Little Difficulty moving from lying on back to sitting on the side of the bed? : Unable Difficulty sitting down on and standing up from a chair with arms (e.g., wheelchair, bedside commode, etc,.)?: A Little Help needed moving to and from a bed to chair (including a wheelchair)?: A Little Help needed walking in hospital room?: A Little Help needed climbing 3-5 steps with a railing? : A Lot 6 Click Score: 15    End of Session Equipment Utilized During Treatment: Gait belt Activity Tolerance: Patient tolerated treatment well Patient left: in chair;with call bell/phone within reach;with chair alarm set Nurse Communication: Mobility status PT Visit Diagnosis: Other abnormalities of gait and mobility (R26.89);Muscle weakness (generalized) (M62.81);Repeated falls (R29.6);History of falling (Z91.81);Difficulty in walking, not elsewhere classified (R26.2);Other symptoms and signs involving the nervous system (R29.898)     Time: 1660-6301 PT Time Calculation (min) (ACUTE ONLY): 18 min  Charges:  $Gait Training: 8-22 mins                     Alben Deeds, PT DPT  Board Certified Neurologic Specialist [place] 10/16/2017, 4:18 PM

## 2017-10-17 DIAGNOSIS — N390 Urinary tract infection, site not specified: Secondary | ICD-10-CM

## 2017-10-17 LAB — PROTIME-INR
INR: 1.58
Prothrombin Time: 18.8 seconds — ABNORMAL HIGH (ref 11.4–15.2)

## 2017-10-17 LAB — GLUCOSE, CAPILLARY: Glucose-Capillary: 160 mg/dL — ABNORMAL HIGH (ref 70–99)

## 2017-10-17 MED ORDER — SODIUM CHLORIDE 0.9 % IV SOLN
1.0000 g | Freq: Once | INTRAVENOUS | Status: AC
  Administered 2017-10-17: 1000 mg via INTRAVENOUS
  Filled 2017-10-17: qty 1

## 2017-10-17 MED ORDER — ENOXAPARIN SODIUM 100 MG/ML ~~LOC~~ SOLN
1.0000 mg/kg | Freq: Once | SUBCUTANEOUS | Status: AC
  Administered 2017-10-17: 90 mg via SUBCUTANEOUS
  Filled 2017-10-17: qty 1

## 2017-10-17 MED ORDER — WARFARIN SODIUM 6 MG PO TABS
6.0000 mg | ORAL_TABLET | Freq: Once | ORAL | Status: DC
  Filled 2017-10-17: qty 1

## 2017-10-17 NOTE — Progress Notes (Signed)
ANTICOAGULATION CONSULT NOTE  Pharmacy Consult:  Coumadin Indication: pulmonary embolus and hypercoagulable state   Allergies  Allergen Reactions  . Crestor [Rosuvastatin Calcium] Other (See Comments)    Feeling very bad, aching all over.  . Erythromycin Hives  . Gabapentin Other (See Comments)    Blurred vision  . Pregabalin Other (See Comments)    Crossed vision.  Marland Kitchen Macrobid [Nitrofurantoin] Hives  . Losartan Itching    Patient Measurements: Height: 5\' 6"  (167.6 cm) Weight: 202 lb (91.6 kg) IBW/kg (Calculated) : 59.3  Vital Signs: Temp: 98.2 F (36.8 C) (08/14 0523) Temp Source: Oral (08/14 0523) BP: 144/73 (08/14 0523) Pulse Rate: 82 (08/14 0523)  Labs: Recent Labs    10/15/17 0441 10/16/17 0323 10/17/17 0451  LABPROT 20.1* 18.3* 18.8*  INR 1.73 1.54 1.58  CREATININE  --  0.61  --     Estimated Creatinine Clearance: 70.3 mL/min (by C-G formula based on SCr of 0.61 mg/dL).   Assessment: 89 YOF with a PMH of pulmonary embolus and hypercoagulable state to continue on Coumadin from PTA.  INR sub-therapeutic at 1.58 and has trended down since admit - this could be due to consumption of Ensure which contains vitamin K, no other DDI noted; no bleeding reported.  Home Coumadin dose:  4mg  PO daily  Goal of Therapy:  INR 2-3 Monitor platelets by anticoagulation protocol: Yes    Plan:  Repeat Coumadin 6 mg PO today Daily PT / INR Monitor for s/sx of bleeding   Renold Genta, PharmD, BCPS Clinical Pharmacist Clinical phone for 10/17/2017 until 3p is [phone] Please check AMION for all Pharmacist numbers by unit 10/17/2017 8:55 AM

## 2017-10-17 NOTE — Discharge Summary (Signed)
[name] [name], is a 72 y.o. female  DOB 1945/04/16  MRN [id number].  Admission date:  10/13/2017  Admitting Physician  Lady Deutscher, MD  Discharge Date:  10/17/2017   Primary MD  [name], [name], MD  Recommendations for primary care physician for things to follow:  -Check CBC, BMP, INR during next visit   Admission Diagnosis  Acute cystitis without hematuria [N30.00] Injury of head, initial encounter [S09.90XA]   Discharge Diagnosis  Acute cystitis without hematuria [N30.00] Injury of head, initial encounter [S09.90XA]    Principal Problem:   Recurrent UTI Active Problems:   History of pulmonary embolism   Fibromyalgia syndrome   Thyroid disease   Parkinson's disease ([place])   Displaced fracture of fifth cervical vertebra (HCC)   Gait disturbance   Benign essential HTN   Hyperlipidemia   Hypercholesterolemia   Glaucoma   Transient cerebral ischemic attack   History of prediabetes   Acute cystitis without hematuria      Past Medical History:  Diagnosis Date  . Arthritis   . Chronic insomnia   . Complication of anesthesia    severe itching night of surgery requiring meds- also couldnt swallow- throat "paralyzed""  . Fibromyalgia   . Glaucoma   . Hypercholesterolemia   . Hyperlipidemia   . Hypertension    PCP Dr Cari Caraway- [place]  05/15/11 with clearance and note on chart,    chest x ray, EKG 12/12 EP[facility], eccho 7/11 [facility]  . Hypothyroidism    s/p graves disease  . Kidney stone   . PD (Parkinson's disease) ([place])   . Peripheral vascular disease ([place])    PULMONARY EMBOLUS x 2- 2011, 2012/ FOLLOWED BY DR ODOGWU-LOV 12/12 [facility]   PT STAES WILL STOP COUMADIN 3/12 and begin LOVONOX 05/17/11 as previously instructed  . Prediabetes   . Sinus infection    at present- dr Honor Loh PA aware- was seen there and at PCP 05/10/11  . Thyroid disease    Hypothyroidism    Past Surgical History:    Procedure Laterality Date  . ABDOMINAL HYSTERECTOMY    . APPENDECTOMY    . CATARACT EXTRACTION Bilateral   . CHOLECYSTECTOMY    . COLONOSCOPY    . EXPLORATORY LAPAROTOMY    . JOINT REPLACEMENT     left knee  7/12  . TOTAL KNEE ARTHROPLASTY  05/22/2011   Procedure: TOTAL KNEE ARTHROPLASTY;  Surgeon: Mauri Pole, MD;  Location: [facility];  Service: Orthopedics;  Laterality: Right;       History of present illness and  Hospital Course:     Kindly see H&P for history of present illness and admission details, please review complete Labs, Consult reports and Test reports for all details in brief  HPI  from the history and physical done on the day of admission 10/13/2017   HPI: [name] [name] is a 72 y.o. female with medical history significant for hypertension, hyperlipidemia, hypothyroidism, Parkinson's disease, peripheral vascular disease, angina, mild cognitive impairment without a diagnosis of dementia, history of  pulmonary embolism with hypercoagulable state, on chronic Coumadin, remote history of TIA, prediabetes, as well as a history of recurrent UTIs, "about twice a week ", last one with cultures positive for ESBL, treated with oral antibiotics for 2 weeks without any improvement, now presenting today with generalized weakness, dizziness since the diagnosis of UTI.  The patient attempted to get out of bed and use the bathroom, feeling weak, and falling over into a dresser, but denying loss of consciousness.  The patient remembers the fall.  She also reported along with the symptoms of burning with urination, taking twoPyridium, with some relief.  The patient denies any syncope, presyncope, vertigo, Denies fevers, chills, night sweats, new vision changes, or mucositis. Denies any respiratory complaints. Denies any chest pain or palpitations. Denies lower extremity swelling.   Denies abdominal pain, no diarrhea or constipation. Appetite is normal.  Denies abnormal skin rashes Denies any  bleeding issues such as epistaxis, hematemesis, gross hematuria or hematochezia.  The patient smokes, but is controlled with nicotine gum, no alcohol or recreational drug use.   ED Course:  BP (!) 142/60 (BP Location: Left Arm)   Pulse 65   Temp 98 F (36.7 C) (Oral)   Resp 19   Ht 5\' 6"  (1.676 m)   Wt 92 kg   SpO2 98%   BMI 32.74 kg/m   Glucose 141 White count 7.1, the rest of the CBC is normal. Urine with positive nitrite Urine culture pending CT of the head negative for acute intracranial abnormalities, no acute fracture of the cervical spine. Renal stone study on 10/13/2017 without acute abnormality to explain the symptoms, known right adrenal adenomas measuring up to 1.9 cm, known nonobstructing right renal stones up to 5 mm in size. PT and INR 24.4-2.22 respectively Last hemoglobin A1c was 6.4 in July 2019   Hospital Course   72 year old female with a history of pulmonary hypertension, PE with hypercoagulable state, parkinsons, HTN, presents with a recurrent urinary tract infection. In early June she was noted to have an ESBL positive E. Coli and was treated then. Again recently she developed UTI symptoms per the patient and was treated with oral fosfomycin for last 14 days PTA and po keflex for the last 5 days PTA. She now presents with LUTS for many weeks and urinary evaluation consistent with recurrent/persistent infection.  Started on broad-spectrum antibiotics meropenem.  RecurrentUTI - recent history of ESBL E. coli in urine culture in June 2019, and again recently started on Rx per PCP w/ 14 days fosfomycin and 5 day po keflex w the fosfomycin at the end.  Still had LUTS and was admitted and started on IV meropenem.  - dr Melvia Heaps spoke w ID, recommended 5d of IV meropenem, today is day #5 - ok for dc after 2nd (afternoon) dose of meropenem tomorrow - note that there was no objective evidence of actual infection (0-5 wbc's rare bacteria / no fever/ normal serum WBC) -  patient asking if her Parkinson's may be causing some of these problems.  Parkinson's is known to cause sig bladder/ voiding problems, but not sure if this could be the real issue - pt saw Dr McDiarmid urology last year for voiding issues, was  scheduled her for f/u visit 10/25/17 at 10 am, see if bladder symptoms could be neurogenic and not infectious  Generalized weakness, lightheadedness, with subsequent fall, likely due to above.  No LOC. With negative CT of the head, no acute fracture of the cervical spine.  -  seen by PT who recommends OK to return to  ALF w/ HHPT.    HypertensionBP -  soft to stable - home ARB held, resume when needed. BP's good, will continue to hold on discharge  Hyperlipidemia Continue home statins  Hypothyroidism: Continue home Synthroid  Glaucoma Continue eyedrops  Parkinson's diseasewith gait disturbance Depression Continue Effexor,Sinemet  Hisptry of PE/ HYpercoagulable state, History of TIA, remote Continue Chronic Coumadin, INR 1.58 on discharge, she received 1 day or so of therapeutic Lovenox before discharge  PrediadiabetesCurrent blood sugar level is141  Discharge Condition:  Stable   Follow UP  Follow-up Information    [name], [name], MD.   Specialty:  Family Medicine Contact information: [address] [place] [address] [phone]        Bjorn Loser, MD Follow up on 10/25/2017.   Specialty:  Urology Why:  WE have scheduled you for a f/u appt w/ urologist Dr Nicki Reaper MacDiarmid 10/25/17 at [place] information: [place] [address] [phone]             Discharge Instructions  and  Discharge Medications     Discharge Instructions    Diet - low sodium heart healthy   Complete by:  As directed    Discharge instructions   Complete by:  As directed    Follow with Primary MD [name], [name], MD in 7 days   Get CBC, CMP,INR checked  by Primary MD next  visit.    Activity: As tolerated with Full fall precautions use walker/cane & assistance as needed   Disposition Home    Diet: Heart Healthy ** , with feeding assistance and aspiration precautions.  For Heart failure patients - Check your Weight same time everyday, if you gain over 2 pounds, or you develop in leg swelling, experience more shortness of breath or chest pain, call your Primary MD immediately. Follow Cardiac Low Salt Diet and 1.5 lit/day fluid restriction.   On your next visit with your primary care physician please Get Medicines reviewed and adjusted.   Please request your Prim.MD to go over all Hospital Tests and Procedure/Radiological results at the follow up, please get all Hospital records sent to your Prim MD by signing hospital release before you go home.   If you experience worsening of your admission symptoms, develop shortness of breath, life threatening emergency, suicidal or homicidal thoughts you must seek medical attention immediately by calling 911 or calling your MD immediately  if symptoms less severe.  You Must read complete instructions/literature along with all the possible adverse reactions/side effects for all the Medicines you take and that have been prescribed to you. Take any new Medicines after you have completely understood and accpet all the possible adverse reactions/side effects.   Do not drive, operating heavy machinery, perform activities at heights, swimming or participation in water activities or provide baby sitting services if your were admitted for syncope or siezures until you have seen by Primary MD or a Neurologist and advised to do so again.  Do not drive when taking Pain medications.    Do not take more than prescribed Pain, Sleep and Anxiety Medications  Special Instructions: If you have smoked or chewed Tobacco  in the last 2 yrs please stop smoking, stop any regular Alcohol  and or any Recreational drug use.  Wear Seat belts  while driving.   Please note  You were cared for by a hospitalist during your hospital stay. If  you have any questions about your discharge medications or the care you received while you were in the hospital after you are discharged, you can call the unit and asked to speak with the hospitalist on call if the hospitalist that took care of you is not available. Once you are discharged, your primary care physician will handle any further medical issues. Please note that NO REFILLS for any discharge medications will be authorized once you are discharged, as it is imperative that you return to your primary care physician (or establish a relationship with a primary care physician if you do not have one) for your aftercare needs so that they can reassess your need for medications and monitor your lab values.   Increase activity slowly   Complete by:  As directed    Increase activity slowly   Complete by:  As directed      Allergies as of 10/17/2017      Reactions   Crestor [rosuvastatin Calcium] Other (See Comments)   Feeling very bad, aching all over.   Erythromycin Hives   Gabapentin Other (See Comments)   Blurred vision   Pregabalin Other (See Comments)   Crossed vision.   Macrobid [nitrofurantoin] Hives   Losartan Itching      Medication List    STOP taking these medications   telmisartan 40 MG tablet Commonly known as:  MICARDIS     TAKE these medications   acetaminophen 500 MG tablet Commonly known as:  TYLENOL Take 500 mg by mouth every 6 (six) hours as needed for mild pain.   atorvastatin 10 MG tablet Commonly known as:  LIPITOR Take 1 tablet (10 mg total) by mouth daily.   BIOFREEZE EX Apply 1 application topically daily as needed (for pain).   carbidopa-levodopa 25-100 MG tablet Commonly known as:  SINEMET IR 2 in the morning, 1 in the afternoon, 1 in the evening What changed:    how much to take  how to take this  when to take this  additional instructions    ketoconazole 2 % shampoo Commonly known as:  NIZORAL Apply 1 application topically 2 (two) times a week.   magnesium oxide 400 (241.3 Mg) MG tablet Commonly known as:  MAG-OX Take 0.5 tablets (200 mg total) by mouth daily.   Melatonin 5 MG Tabs Take 1 tablet by mouth at bedtime as needed (for sleep).   nicotine polacrilex 2 MG gum Commonly known as:  NICORETTE Take 2 mg by mouth as needed for smoking cessation.   SYNTHROID 150 MCG tablet Generic drug:  levothyroxine Take 1 tablet (150 mcg total) by mouth daily before breakfast.   venlafaxine XR 150 MG 24 hr capsule Commonly known as:  EFFEXOR-XR Take 1 capsule (150 mg total) by mouth daily with breakfast.   vitamin B-12 500 MCG tablet Commonly known as:  CYANOCOBALAMIN Take 500 mcg by mouth daily.   Vitamin D3 5000 units Caps Take 5,000 Units by mouth daily.   warfarin 4 MG tablet Commonly known as:  COUMADIN Take as directed. If you are unsure how to take this medication, talk to your nurse or doctor. Original instructions:  Take 1 tablet (4 mg total) by mouth See admin instructions. Adjust as directed by MD What changed:  when to take this   zolpidem 5 MG tablet Commonly known as:  AMBIEN Take 1 tablet (5 mg total) by mouth at bedtime as needed for sleep. Pt requests brand only. Do not combine with any other sedating substance  Diet and Activity recommendation: See Discharge Instructions above   Consults obtained -  None   Major procedures and Radiology Reports - PLEASE review detailed and final reports for all details, in brief -      Ct Head Wo Contrast  Result Date: 10/13/2017 CLINICAL DATA:  Head trauma while on anti coagulation therapy EXAM: CT HEAD WITHOUT CONTRAST CT CERVICAL SPINE WITHOUT CONTRAST TECHNIQUE: Multidetector CT imaging of the head and cervical spine was performed following the standard protocol without intravenous contrast. Multiplanar CT image reconstructions of the cervical  spine were also generated. COMPARISON:  CT head and cervical spine 07/03/2017 FINDINGS: CT HEAD FINDINGS Brain: There is no mass, hemorrhage or extra-axial collection. There is generalized atrophy without lobar predilection. There is no acute or chronic infarction. There is hypoattenuation of the periventricular white matter, most commonly indicating chronic ischemic microangiopathy. Vascular: No abnormal hyperdensity of the major intracranial arteries or dural venous sinuses. No intracranial atherosclerosis. Skull: The visualized skull base, calvarium and extracranial soft tissues are normal. Sinuses/Orbits: No fluid levels or advanced mucosal thickening of the visualized paranasal sinuses. No mastoid or middle ear effusion. The orbits are normal. CT CERVICAL SPINE FINDINGS Alignment: No static subluxation. Facets are aligned. Occipital condyles are normally positioned. Skull base and vertebrae: No acute fracture. Multilevel height loss, greatest at the C5 inferior endplate. Soft tissues and spinal canal: No prevertebral fluid or swelling. No visible canal hematoma. Disc levels: There is left facet fusion from C2-C6. Upper chest: No pneumothorax, pulmonary nodule or pleural effusion. Other: Normal visualized paraspinal cervical soft tissues. IMPRESSION: 1. No acute intracranial abnormality. 2. No acute fracture of the cervical spine. 3. Advanced chronic microvascular disease and generalized atrophy of the brain. 4. Left C2-C6 left facet fusion. Electronically Signed   By: Ulyses Jarred M.D.   On: 10/13/2017 02:24   Ct Cervical Spine Wo Contrast  Result Date: 10/13/2017 CLINICAL DATA:  Head trauma while on anti coagulation therapy EXAM: CT HEAD WITHOUT CONTRAST CT CERVICAL SPINE WITHOUT CONTRAST TECHNIQUE: Multidetector CT imaging of the head and cervical spine was performed following the standard protocol without intravenous contrast. Multiplanar CT image reconstructions of the cervical spine were also  generated. COMPARISON:  CT head and cervical spine 07/03/2017 FINDINGS: CT HEAD FINDINGS Brain: There is no mass, hemorrhage or extra-axial collection. There is generalized atrophy without lobar predilection. There is no acute or chronic infarction. There is hypoattenuation of the periventricular white matter, most commonly indicating chronic ischemic microangiopathy. Vascular: No abnormal hyperdensity of the major intracranial arteries or dural venous sinuses. No intracranial atherosclerosis. Skull: The visualized skull base, calvarium and extracranial soft tissues are normal. Sinuses/Orbits: No fluid levels or advanced mucosal thickening of the visualized paranasal sinuses. No mastoid or middle ear effusion. The orbits are normal. CT CERVICAL SPINE FINDINGS Alignment: No static subluxation. Facets are aligned. Occipital condyles are normally positioned. Skull base and vertebrae: No acute fracture. Multilevel height loss, greatest at the C5 inferior endplate. Soft tissues and spinal canal: No prevertebral fluid or swelling. No visible canal hematoma. Disc levels: There is left facet fusion from C2-C6. Upper chest: No pneumothorax, pulmonary nodule or pleural effusion. Other: Normal visualized paraspinal cervical soft tissues. IMPRESSION: 1. No acute intracranial abnormality. 2. No acute fracture of the cervical spine. 3. Advanced chronic microvascular disease and generalized atrophy of the brain. 4. Left C2-C6 left facet fusion. Electronically Signed   By: Ulyses Jarred M.D.   On: 10/13/2017 02:24   Ct Renal Stone Study  Result Date: 10/13/2017 CLINICAL DATA:  Status post fall, with generalized weakness and dizziness. Recently diagnosed with urinary tract infection. Headache and nausea. EXAM: CT ABDOMEN AND PELVIS WITHOUT CONTRAST TECHNIQUE: Multidetector CT imaging of the abdomen and pelvis was performed following the standard protocol without IV contrast. COMPARISON:  CT of the abdomen and pelvis performed  05/25/2017 FINDINGS: Lower chest: Minimal bibasilar atelectasis is noted. Scattered coronary artery calcifications are seen. Hepatobiliary: The liver is unremarkable in appearance. The patient is status post cholecystectomy, with clips noted at the gallbladder fossa. The common bile duct remains normal in caliber. Pancreas: The pancreas is within normal limits. Spleen: The spleen is unremarkable in appearance. Adrenals/Urinary Tract: Right adrenal adenomas measure up to 1.9 cm in size. The left adrenal gland is unremarkable. Nonobstructing right renal stones measure up to 5 mm in size. Nonspecific perinephric stranding is noted bilaterally. There is no evidence of hydronephrosis. No obstructing ureteral stones are seen. Stomach/Bowel: The stomach is unremarkable in appearance. The small bowel is within normal limits. The patient is status post appendectomy. The colon is unremarkable in appearance. Vascular/Lymphatic: Diffuse calcification is seen along the abdominal aorta and its branches. The abdominal aorta is otherwise grossly unremarkable. The inferior vena cava is grossly unremarkable. No retroperitoneal lymphadenopathy is seen. No pelvic sidewall lymphadenopathy is identified. Reproductive: The bladder is mildly distended. Mild soft tissue inflammation about the bladder appears relatively stable. The patient is status post hysterectomy. No suspicious adnexal masses are seen. Other: No additional soft tissue abnormalities are seen. Musculoskeletal: No acute osseous abnormalities are identified. Vacuum phenomenon is noted at the lower lumbar spine. The visualized musculature is unremarkable in appearance. IMPRESSION: 1. No acute abnormality seen to explain the patient's symptoms. 2. Right adrenal adenomas measure up to 1.9 cm in size. 3. Scattered coronary artery calcifications seen. 4. Nonobstructing right renal stones measure up to 5 mm in size. Aortic Atherosclerosis (ICD10-I70.0). Electronically Signed   By:  Garald Balding M.D.   On: 10/13/2017 02:24    Micro Results    Recent Results (from the past 240 hour(s))  Urine culture     Status: Abnormal   Collection Time: 10/13/17  2:49 AM  Result Value Ref Range Status   Specimen Description URINE, CLEAN CATCH  Final   Special Requests   Final    NONE Performed at [facility], [address], [place], [place] [address]    Culture (A)  Final    >=100,000 COLONIES/mL ESCHERICHIA COLI Confirmed Extended Spectrum Beta-Lactamase Producer (ESBL).  In bloodstream infections from ESBL organisms, carbapenems are preferred over piperacillin/tazobactam. They are shown to have a lower risk of mortality.    Report Status 10/15/2017 FINAL  Final   Organism ID, Bacteria ESCHERICHIA COLI (A)  Final      Susceptibility   Escherichia coli - MIC*    AMPICILLIN >=32 RESISTANT Resistant     CEFAZOLIN >=64 RESISTANT Resistant     CEFTRIAXONE >=64 RESISTANT Resistant     CIPROFLOXACIN >=4 RESISTANT Resistant     GENTAMICIN <=1 SENSITIVE Sensitive     IMIPENEM <=0.25 SENSITIVE Sensitive     NITROFURANTOIN <=16 SENSITIVE Sensitive     TRIMETH/SULFA >=320 RESISTANT Resistant     AMPICILLIN/SULBACTAM >=32 RESISTANT Resistant     PIP/TAZO <=4 SENSITIVE Sensitive     Extended ESBL POSITIVE Resistant     * >=100,000 COLONIES/mL ESCHERICHIA COLI  Culture, blood (Routine X 2) w Reflex to ID Panel     Status: None (Preliminary  result)   Collection Time: 10/13/17  5:20 AM  Result Value Ref Range Status   Specimen Description BLOOD RIGHT HAND  Final   Special Requests   Final    BOTTLES DRAWN AEROBIC AND ANAEROBIC Blood Culture adequate volume   Culture   Final    NO GROWTH 3 DAYS Performed at [facility], [address] [place], [place] [address]    Report Status PENDING  Incomplete  Culture, blood (Routine X 2) w Reflex to ID Panel     Status: None (Preliminary result)   Collection Time: 10/13/17  5:30 AM  Result Value Ref Range Status    Specimen Description BLOOD LEFT HAND  Final   Special Requests   Final    BOTTLES DRAWN AEROBIC AND ANAEROBIC Blood Culture adequate volume   Culture   Final    NO GROWTH 3 DAYS Performed at [facility], [place] [address], [place], [place] [address]    Report Status PENDING  Incomplete       Today   Subjective:   [name] [name] today has no headache,no chest or abdominal pain,no new weakness tingling or numbness, feels much better  today.   Objective:   Blood pressure (!) 144/73, pulse 82, temperature 98.2 F (36.8 C), temperature source Oral, resp. rate 18, height 5\' 6"  (1.676 m), weight 91.6 kg, SpO2 94 %.   Intake/Output Summary (Last 24 hours) at 10/17/2017 1117 Last data filed at 10/17/2017 0600 Gross per 24 hour  Intake 1375.02 ml  Output 1950 ml  Net -574.98 ml    Exam Awake Alert, Oriented x 3, No new F.N deficits, Normal affect Symmetrical Chest wall movement, Good air movement bilaterally, CTAB RRR,No Gallops,Rubs or new Murmurs, No Parasternal Heave +ve B.Sounds, Abd Soft, Non tender, No rebound -guarding or rigidity. No Cyanosis, Clubbing or edema, No new Rash or bruise  Data Review   CBC w Diff:  Lab Results  Component Value Date   WBC 5.6 10/14/2017   HGB 12.8 10/14/2017   HGB 13.1 02/07/2012   HCT 40.3 10/14/2017   HCT 39.6 02/07/2012   PLT 229 10/14/2017   PLT 348 02/07/2012   LYMPHOPCT 6 02/11/2017   LYMPHOPCT 32.8 02/07/2012   MONOPCT 11.7 03/09/2017   MONOPCT 9.7 02/07/2012   EOSPCT 2.6 03/09/2017   EOSPCT 5.6 02/07/2012   BASOPCT 1.1 03/09/2017   BASOPCT 0.7 02/07/2012    CMP:  Lab Results  Component Value Date   NA 139 10/16/2017   NA 145 12/01/2016   K 4.2 10/16/2017   CL 101 10/16/2017   CO2 25 10/16/2017   BUN 13 10/16/2017   BUN 10 12/01/2016   CREATININE 0.61 10/16/2017   CREATININE 0.98 (H) 03/09/2017   GLU 106 12/01/2016   PROT 5.6 (L) 10/13/2017   ALBUMIN 3.2 (L) 10/13/2017   BILITOT 0.7 10/13/2017   ALKPHOS  115 10/13/2017   AST 22 10/13/2017   ALT 6 10/13/2017  .   Total Time in preparing paper work, data evaluation and todays exam - 20 minutes  Phillips Climes M.D on 10/17/2017 at 11:17 AM  [facility]   Office  [phone]

## 2017-10-18 ENCOUNTER — Telehealth: Payer: Self-pay

## 2017-10-18 LAB — CULTURE, BLOOD (ROUTINE X 2)
CULTURE: NO GROWTH
Culture: NO GROWTH
SPECIAL REQUESTS: ADEQUATE
SPECIAL REQUESTS: ADEQUATE

## 2017-10-18 NOTE — Telephone Encounter (Signed)
10/18/17   Transition Care Management Follow-up Telephone Call  ADMISSION DATE: 10/13/17  DISCHARGE DATE: 10/17/17  PATIENT TO HAVE BMP,CBC AND INR DRAWN during hospital follow up visit.   How have you been since you were released from the hospital?  Feeling weak per patient.   Do you understand why you were in the hospital? Yes     Do you understand the discharge instrcutions? Yes    Items Reviewed:  Medications reviewed: Yes   Allergies reviewed:Yes   Dietary changes reviewed: Low sodium Heart healthy   Referrals reviewed: Appointment scheduled    Functional Questionnaire:  Activities of Daily Living (ADLs): Patient states she can perform all independently.  Any patient concerns? Concerned about discontinuing Micardis.   Confirmed importance and date/time of follow-up visits scheduled:Yes   Confirmed with patient if condition begins to worsen call PCP or go to the ER. Yes   Patient was given the office number and encouragred to call back with questions or concerns. Yes

## 2017-10-22 ENCOUNTER — Telehealth: Payer: Self-pay | Admitting: *Deleted

## 2017-10-22 NOTE — Telephone Encounter (Signed)
Received Physician Orders from [facility]; forwarded to provider/SLS 08/19

## 2017-10-23 NOTE — Progress Notes (Signed)
[place] [facility] [place], [place], [place] [address] [phone]  Date:  10/24/2017   Name:  [name] [name]   DOB:  [birth year]   MRN:  [id number]  PCP:  Darreld Mclean, MD    Chief Complaint: Hospitalization Follow-up (feeling much better, UTI, brought sample)   History of Present Illness:  [name] [name] is a 72 y.o. very pleasant female patient who presents with the following:  Hospital follow-up today History of TIA, PD, hyperlipidemia, HTN, hypothyroidism, hypercoagulable state  Recnetly admitted as below: Admission date:  10/13/2017  Admitting Physician  Lady Deutscher, MD Discharge Date:  10/17/2017  Primary MD  [name], [name], MD  Recommendations for primary care physician for things to follow:  -Check CBC, BMP, INR during next visit  Admission Diagnosis  Acute cystitis without hematuria [N30.00] Injury of head, initial encounter [S09.90XA]  Discharge Diagnosis  Acute cystitis without hematuria [N30.00] Injury of head, initial encounter [S09.90XA]    Principal Problem:   Recurrent UTI Active Problems:   History of pulmonary embolism   Fibromyalgia syndrome   Thyroid disease   Parkinson's disease ([place])   Displaced fracture of fifth cervical vertebra (HCC)   Gait disturbance   Benign essential HTN   Hyperlipidemia   Hypercholesterolemia   Glaucoma   Transient cerebral ischemic attack   History of prediabetes   Acute cystitis without hematuria  History of present illness and  Hospital Course:    HPI  from the history and physical done on the day of admission 10/13/2017  PVX:YIAXK Garrickis a 72 y.o.femalewith medical history significant forhypertension, hyperlipidemia, hypothyroidism, Parkinson's disease, peripheral vascular disease,angina, mild cognitive impairment without a diagnosis of dementia,history of pulmonary embolism with hypercoagulable state, on chronic Coumadin, remote  history of TIA, prediabetes, as well as a history ofrecurrent UTIs,"about twice a week ", last one withcultures positive forESBL, treated with oral antibiotics for 2 weeks without any improvement, now presenting today with generalized weakness, dizziness since the diagnosis of UTI. The patient attempted to get out of bed and use the bathroom, feeling weak, and falling over into a dresser, but denying loss of consciousness. The patient remembers the fall. She also reported along with the symptoms of burning with urination, taking twoPyridium, with some relief. The patient denies any syncope, presyncope, vertigo, Denies fevers, chills, night sweats,newvision changes, or mucositis. Denies any respiratory complaints. Denies any chest pain or palpitations. Denies lower extremity swelling. Denies abdominal pain, no diarrhea or constipation. Appetite is normal. Denies abnormal skin rashes Denies any bleeding issues such as epistaxis, hematemesis, grosshematuria or hematochezia.The patient smokes, but is controlled with nicotine gum, no alcohol or recreational drug use.  ED Course:BP (!) 142/60 (BP Location: Left Arm)  Pulse 65  Temp 98 F (36.7 C) (Oral)  Resp 19  Ht 5\' 6"  (1.676 m)  Wt 92 kg  SpO2 98%  BMI 32.74 kg/m  Glucose 141 White count 7.1, the rest of the CBC is normal. Urine with positive nitrite Urine culture pending CT of the head negative for acute intracranial abnormalities, no acute fracture of the cervical spine. Renal stone study on 10/13/2017 without acute abnormality to explain the symptoms, known right adrenal adenomas measuring up to 1.9 cm, known nonobstructing right renal stones up to 5 mm in size. PT and INR 24.4-2.22 respectively Last hemoglobin A1c was 6.4 in July 2019  Hospital Course  72 year old female with a history of pulmonary hypertension, PE with  hypercoagulable state, parkinsons, HTN, presents with a recurrent urinary tract infection. In  early June she was noted to have an ESBL positive E. Coli and was treated then. Again recently she developed UTI symptoms per the patient and was treated with oral fosfomycin for last 14 days PTA and po keflex for the last 5 days PTA. She now presents with LUTS for many weeks and urinary evaluation consistent with recurrent/persistent infection. Started on broad-spectrum antibiotics meropenem. RecurrentUTI - recent history of ESBL E. coli in urine culture in June 2019, and again recently started on Rx per PCP w/ 14 days fosfomycin and 5 day po keflex w the fosfomycin at the end. Still had LUTS and was admitted and started on IV meropenem.  -dr Melvia Heaps spoke w ID,recommended5d of IV meropenem, today is day #5 - ok for dcafter 2nd (afternoon) dose of meropenem tomorrow - note that there was no objective evidenceofactual infection(0-5 wbc's rare bacteria / no fever/ normal serum WBC) - patient asking if her Parkinson's may be causing some of these problems. Parkinson's is known to cause sig bladder/ voiding problems, but not sure if this could be the real issue - pt sawDr McDiarmidurologylast year for voiding issues, was  scheduled her for f/u visit 10/25/17 at 10 am, see if bladder symptoms could be neurogenic and not infectious Generalized weakness, lightheadedness, with subsequent fall, likely due to above. No LOC. With negative CT of the head, no acute fracture of the cervical spine.  - seen by PT who recommends OK to return to ALF w/ HHPT.  HypertensionBP -soft to stable - home ARB held(telmisartan), resume when needed. BP's good, will continue to hold on discharge Hyperlipidemia Continue home statins Hypothyroidism: Continue home Synthroid Glaucoma Continue eyedrops Parkinson's diseasewith gait disturbance Depression Continue Effexor,Sinemet Hisptry of PE/ HYpercoagulable state, History of TIA, remote Continue Chronic Coumadin, INR 1.58 on discharge, she received 1  day or so of therapeutic Lovenox before discharge PrediadiabetesCurrent blood sugar level is141     Follow-up Information    [name], [name], MD.   Specialty:  Family Medicine Contact information: [address] [place] [address] [phone]        Bjorn Loser, MD Follow up on 10/25/2017.   Specialty:  Urology Why:  WE have scheduled you for a f/u appt w/ urologist Dr Nicki Reaper MacDiarmid 10/25/17 at Leal information: [place] [address] [phone]            She notes that she is feeling "fine" and that she seems to have recovered well No urinary burning or hematuria She stopping using urine leakage pads and this seems to be helping with her urinary sx She has a urology appt coming up  She is back on coumadin now- 4 mg each day, has taken this dose for the last wek Her appetite is not as good as it was in the past, but no belly pain No fever She has completed her abx- she stayed inpt until her IV abx were done, she was not sent home with any oral abx   We increased her venlafaxine to 150 recently and this is working well for her, she would like to change to a 90 day rx   She was supposed to see endocrinology for likely hyperparathryoidism but had to delay as she was in the hospital on the date of her appt- she will follow up with them asap.  Her calcium was ok on most recent labs  She was on telmisartan 40 mg,but his was held during admission  Lab Results  Component Value Date   HGBA1C 6.4 09/27/2017   Her colonoscopy was due in 2016- she reports that her previous screening was normal  Wt Readings from Last 3 Encounters:  10/24/17 198 lb (89.8 kg)  10/16/17 202 lb (91.6 kg)  10/09/17 196 lb (88.9 kg)   Tayanna lives at [facility] which she does not love, but it is ok [name] helps out as much as she can   BP Readings from Last 3 Encounters:  10/24/17 126/80  10/17/17 (!) 144/73  10/09/17  120/76     Patient Active Problem List   Diagnosis Date Noted  . Acute cystitis without hematuria   . Glaucoma 10/13/2017  . Transient cerebral ischemic attack 10/13/2017  . History of prediabetes 10/13/2017  . Sepsis secondary to UTI ([place]) 02/12/2017  . Elevated troponin 02/12/2017  . AKI (acute kidney injury) ([place]) 02/12/2017  . Recurrent UTI 11/23/2016  . Fall at nursing home 11/23/2016  . PD (Parkinson's disease) ([place]) 11/23/2016  . Sepsis due to urinary tract infection ([place]) 11/23/2016  . Fibromyalgia 11/23/2016  . Hypercholesterolemia 11/23/2016  . Hypertension 11/23/2016  . Hypothyroidism, adult 11/23/2016  . Elevated CK 11/23/2016  . Pulmonary HTN ([place]) 11/23/2016  . Left ventricular diastolic dysfunction 05/39/7673  . Metabolic encephalopathy 41/93/7902  . History of pulmonary embolus (PE) 2/2 hypercoagulable state 11/23/2016  . Sepsis ([place]) 09/17/2016  . Acute metabolic encephalopathy 40/97/3532  . Cellulitis of right foot 09/17/2016  . Fall   . SIRS (systemic inflammatory response syndrome) ([place]) 04/29/2016  . Hyperlipidemia 04/29/2016  . Hypothyroidism 04/29/2016  . Hematuria 04/05/2016  . Cough 04/05/2016  . Benign essential HTN   . Intractable pain 12/17/2015  . Displaced fracture of fifth cervical vertebra ([place]) 12/17/2015  . Gait disturbance 12/17/2015  . Trochanteric bursitis of both hips 09/13/2015  . Chronic low back pain 06/07/2015  . Fibromyalgia syndrome 05/17/2015  . Thyroid disease 05/17/2015  . Parkinson's disease ([place]) 05/17/2015  . Hypercoagulable state ([place]) 02/07/2012  . History of pulmonary embolism 02/07/2012  . S/P right TKA 05/22/2011    Past Medical History:  Diagnosis Date  . Arthritis   . Chronic insomnia   . Complication of anesthesia    severe itching night of surgery requiring meds- also couldnt swallow- throat "paralyzed""  . Fibromyalgia   . Glaucoma   . Hypercholesterolemia   . Hyperlipidemia   . Hypertension    PCP  Dr Cari Caraway- [place]  05/15/11 with clearance and note on chart,    chest x ray, EKG 12/12 [facility], eccho 7/11 [facility]  . Hypothyroidism    s/p graves disease  . Kidney stone   . PD (Parkinson's disease) ([place])   . Peripheral vascular disease ([place])    PULMONARY EMBOLUS x 2- 2011, 2012/ FOLLOWED BY DR [id number]-LOV 12/12 [facility]   PT STAES WILL STOP COUMADIN 3/12 and begin LOVONOX 05/17/11 as previously instructed  . Prediabetes   . Sinus infection    at present- dr Honor Loh PA aware- was seen there and at PCP 05/10/11  . Thyroid disease    Hypothyroidism    Past Surgical History:  Procedure Laterality Date  . ABDOMINAL HYSTERECTOMY    . APPENDECTOMY    . CATARACT EXTRACTION Bilateral   . CHOLECYSTECTOMY    . COLONOSCOPY    . EXPLORATORY LAPAROTOMY    . JOINT REPLACEMENT     left knee  7/12  .  TOTAL KNEE ARTHROPLASTY  05/22/2011   Procedure: TOTAL KNEE ARTHROPLASTY;  Surgeon: Mauri Pole, MD;  Location: [facility];  Service: Orthopedics;  Laterality: Right;    Social History   Tobacco Use  . Smoking status: Former Smoker    Packs/day: 1.00    Last attempt to quit: 05/10/1989    Years since quitting: 28.4  . Smokeless tobacco: Never Used  Substance Use Topics  . Alcohol use: Yes    Alcohol/week: 0.0 standard drinks    Comment: one drink once a month  . Drug use: No    Family History  Problem Relation Age of Onset  . Prostate cancer Father   . Stroke Father     Allergies  Allergen Reactions  . Crestor [Rosuvastatin Calcium] Other (See Comments)    Feeling very bad, aching all over.  . Erythromycin Hives  . Gabapentin Other (See Comments)    Blurred vision  . Pregabalin Other (See Comments)    Crossed vision.  Marland Kitchen Macrobid [Nitrofurantoin] Hives  . Losartan Itching    Medication list has been reviewed and updated.  Current Outpatient Medications on File Prior to Visit  Medication Sig Dispense Refill  . acetaminophen (TYLENOL) 500 MG tablet Take 500 mg by mouth every 6 (six) hours  as needed for mild pain.    Marland Kitchen atorvastatin (LIPITOR) 10 MG tablet Take 1 tablet (10 mg total) by mouth daily. 90 tablet 3  . carbidopa-levodopa (SINEMET IR) 25-100 MG tablet 2 in the morning, 1 in the afternoon, 1 in the evening (Patient taking differently: Take 1-2 tablets by mouth See admin instructions. Take 2 tablets in the morning then take 1 tablet  in the afternoon and take 1 tablet  in the evening) 360 tablet 1  . Cholecalciferol (VITAMIN D3) 5000 UNITS CAPS Take 5,000 Units by mouth daily.     . cyanocobalamin 500 MCG tablet Take 500 mcg by mouth daily.    Marland Kitchen ketoconazole (NIZORAL) 2 % shampoo Apply 1 application topically 2 (two) times a week. 120 mL 3  . magnesium oxide (MAG-OX) 400 (241.3 Mg) MG tablet Take 0.5 tablets (200 mg total) by mouth daily. 30 tablet 0  . Melatonin 5 MG TABS Take 1 tablet by mouth at bedtime as needed (for sleep).    . Menthol, Topical Analgesic, (BIOFREEZE EX) Apply 1 application topically daily as needed (for pain).    . nicotine polacrilex (NICORETTE) 2 MG gum Take 2 mg by mouth as needed for smoking cessation.    Marland Kitchen SYNTHROID 150 MCG tablet Take 1 tablet (150 mcg total) by mouth daily before breakfast. 90 tablet 1  . warfarin (COUMADIN) 4 MG tablet Take 1 tablet (4 mg total) by mouth See admin instructions. Adjust as directed by MD (Patient taking differently: Take 4 mg by mouth daily. Adjust as directed by MD) 90 tablet 3  . zolpidem (AMBIEN) 5 MG tablet Take 1 tablet (5 mg total) by mouth at bedtime as needed for sleep. Pt requests brand only. Do not combine with any other sedating substance 30 tablet 1   No current facility-administered medications on file prior to visit.     Review of Systems:  As per HPI- otherwise negative. No fever or chills No CP or SOB    Physical Examination: Vitals:   10/24/17 1050  BP: 126/80  Pulse: 94  Resp: 16  Temp: 98.2 F (36.8 C)  SpO2: 96%   Vitals:   10/24/17 1050  Weight: 198 lb (89.8 kg)  Height: 5'  6" (1.676 m)   Body mass index is 31.96 kg/m. Ideal Body Weight: Weight in (lb) to have BMI = 25: 154.6  GEN: WDWN, NAD, Non-toxic, A & O x 3, overweight, looks well  HEENT: Atraumatic, Normocephalic. Neck supple. No masses, No LAD.  Bilateral TM wnl, oropharynx normal.  PEERL,EOMI.   Ears and Nose: No external deformity. CV: RRR, No M/G/R. No JVD. No thrill. No extra heart sounds. PULM: CTA B, no wheezes, crackles, rhonchi. No retractions. No resp. distress. No accessory muscle use. ABD: S, NT, ND, +BS. No rebound. No HSM. EXTR: No c/c/e NEURO Normal gait for pt- slow, wide based, uses walker PSYCH: Normally interactive. Conversant. Not depressed or anxious appearing.  Calm demeanor.    Assessment and Plan: Hospital discharge follow-up  Recurrent UTI - Plan: Urine Culture  Essential hypertension - Plan: CBC, Basic metabolic panel  Hypercoagulable state ([place]) - Plan: INR/PT  Parkinson disease ([place]) - Plan: venlafaxine XR (EFFEXOR-XR) 150 MG 24 hr capsule  Following up from recent hospital admit today She is feeling quite well We will re-culture her urine to make sure infection is cleared Seeing urology tomorrow about recurrent UTI Her BP is ok, will continue to hold telmisartan for now Ordered cologuard as she is due for screening  She is back on her coumadin- check INR today Refilled effexor  Signed Lamar Blinks, MD   Received her INR and called pt at 5pm: Lab Results  Component Value Date   INR 1.5 (A) 10/24/2017   INR 1.58 10/17/2017   INR 1.54 10/16/2017   PROTIME 32.4 (H) 02/14/2011   PROTIME 36.0 (H) 07/26/2010   She is taking 4 mg daily = 28 mg a week.  Increase by 15%- will have her increase to 1.5 tablets 2 days a week and repeat INR in one week   Called pt and went over this, she understands Received the rest of her labs- ok.  Message to pt   Results for orders placed or performed in visit on 10/24/17  CBC  Result Value Ref Range   WBC 6.5 4.0 -  10.5 K/uL   RBC 4.85 3.87 - 5.11 Mil/uL   Platelets 321.0 150.0 - 400.0 K/uL   Hemoglobin 14.2 12.0 - 15.0 g/dL   HCT 42.7 36.0 - 46.0 %   MCV 88.1 78.0 - 100.0 fl   MCHC 33.3 30.0 - 36.0 g/dL   RDW 13.0 11.5 - 32.5 %  Basic metabolic panel  Result Value Ref Range   Sodium 142 135 - 145 mEq/L   Potassium 4.9 3.5 - 5.1 mEq/L   Chloride 104 96 - 112 mEq/L   CO2 29 19 - 32 mEq/L   Glucose, Bld 116 (H) 70 - 99 mg/dL   BUN 10 6 - 23 mg/dL   Creatinine, Ser 0.69 0.40 - 1.20 mg/dL   Calcium 10.0 8.4 - 10.5 mg/dL   GFR 88.24 >60.00 mL/min  POCT INR  Result Value Ref Range   INR 1.5 (A) 2.0 - 3.0

## 2017-10-24 ENCOUNTER — Ambulatory Visit (INDEPENDENT_AMBULATORY_CARE_PROVIDER_SITE_OTHER): Payer: Medicare Other | Admitting: Family Medicine

## 2017-10-24 ENCOUNTER — Inpatient Hospital Stay: Payer: Medicare Other | Admitting: Family Medicine

## 2017-10-24 ENCOUNTER — Encounter: Payer: Self-pay | Admitting: Family Medicine

## 2017-10-24 ENCOUNTER — Other Ambulatory Visit: Payer: Self-pay | Admitting: Medical

## 2017-10-24 VITALS — BP 126/80 | HR 94 | Temp 98.2°F | Resp 16 | Ht 66.0 in | Wt 198.0 lb

## 2017-10-24 DIAGNOSIS — Z7901 Long term (current) use of anticoagulants: Secondary | ICD-10-CM

## 2017-10-24 DIAGNOSIS — D6859 Other primary thrombophilia: Secondary | ICD-10-CM | POA: Diagnosis not present

## 2017-10-24 DIAGNOSIS — I10 Essential (primary) hypertension: Secondary | ICD-10-CM

## 2017-10-24 DIAGNOSIS — N390 Urinary tract infection, site not specified: Secondary | ICD-10-CM | POA: Diagnosis not present

## 2017-10-24 DIAGNOSIS — G20 Parkinson's disease: Secondary | ICD-10-CM

## 2017-10-24 DIAGNOSIS — Z09 Encounter for follow-up examination after completed treatment for conditions other than malignant neoplasm: Secondary | ICD-10-CM

## 2017-10-24 DIAGNOSIS — Z87440 Personal history of urinary (tract) infections: Secondary | ICD-10-CM | POA: Diagnosis not present

## 2017-10-24 LAB — BASIC METABOLIC PANEL
BUN: 10 mg/dL (ref 6–23)
CALCIUM: 10 mg/dL (ref 8.4–10.5)
CHLORIDE: 104 meq/L (ref 96–112)
CO2: 29 meq/L (ref 19–32)
Creatinine, Ser: 0.69 mg/dL (ref 0.40–1.20)
GFR: 88.24 mL/min (ref >60.00)
GLUCOSE: 116 mg/dL — AB (ref 70–99)
Potassium: 4.9 mEq/L (ref 3.5–5.1)
SODIUM: 142 meq/L (ref 135–145)

## 2017-10-24 LAB — CBC
HEMATOCRIT: 42.7 % (ref 36.0–46.0)
Hemoglobin: 14.2 g/dL (ref 12.0–15.0)
MCHC: 33.3 g/dL (ref 30.0–36.0)
MCV: 88.1 fl (ref 78.0–100.0)
Platelets: 321 10*3/uL (ref 150.0–400.0)
RBC: 4.85 Mil/uL (ref 3.87–5.11)
RDW: 13 % (ref 11.5–15.5)
WBC: 6.5 10*3/uL (ref 4.0–10.5)

## 2017-10-24 LAB — POCT INR: INR: 1.5 — AB (ref 2.0–3.0)

## 2017-10-24 MED ORDER — VENLAFAXINE HCL ER 150 MG PO CP24: 150.0000 mg | ORAL_CAPSULE | Freq: Every day | ORAL | 3 refills | Status: DC

## 2017-10-24 NOTE — Patient Instructions (Signed)
Good to see you today- you are looking great!   I will be in touch with your labs asap We re-cultured your urine today Your blood pressure looks fine- continue to NOT take the telmisartan at this time We will set you up for colon cancer screening- Cologuard home kit  Please see me in about 2 months and take care

## 2017-10-25 DIAGNOSIS — N3946 Mixed incontinence: Secondary | ICD-10-CM | POA: Diagnosis not present

## 2017-10-25 LAB — URINE CULTURE
MICRO NUMBER:: 90996705
SPECIMEN QUALITY: ADEQUATE

## 2017-10-26 DIAGNOSIS — G20 Parkinson's disease: Secondary | ICD-10-CM | POA: Diagnosis not present

## 2017-10-26 DIAGNOSIS — R260 Ataxic gait: Secondary | ICD-10-CM | POA: Diagnosis not present

## 2017-10-26 DIAGNOSIS — M545 Low back pain: Secondary | ICD-10-CM | POA: Diagnosis not present

## 2017-10-26 DIAGNOSIS — M25511 Pain in right shoulder: Secondary | ICD-10-CM | POA: Diagnosis not present

## 2017-10-26 DIAGNOSIS — M542 Cervicalgia: Secondary | ICD-10-CM | POA: Diagnosis not present

## 2017-10-26 DIAGNOSIS — R262 Difficulty in walking, not elsewhere classified: Secondary | ICD-10-CM | POA: Diagnosis not present

## 2017-10-26 DIAGNOSIS — M6281 Muscle weakness (generalized): Secondary | ICD-10-CM | POA: Diagnosis not present

## 2017-10-26 DIAGNOSIS — M25512 Pain in left shoulder: Secondary | ICD-10-CM | POA: Diagnosis not present

## 2017-10-29 DIAGNOSIS — M545 Low back pain: Secondary | ICD-10-CM | POA: Diagnosis not present

## 2017-10-29 DIAGNOSIS — M542 Cervicalgia: Secondary | ICD-10-CM | POA: Diagnosis not present

## 2017-10-29 DIAGNOSIS — M25511 Pain in right shoulder: Secondary | ICD-10-CM | POA: Diagnosis not present

## 2017-10-29 DIAGNOSIS — M25512 Pain in left shoulder: Secondary | ICD-10-CM | POA: Diagnosis not present

## 2017-10-29 DIAGNOSIS — M6281 Muscle weakness (generalized): Secondary | ICD-10-CM | POA: Diagnosis not present

## 2017-10-29 DIAGNOSIS — R260 Ataxic gait: Secondary | ICD-10-CM | POA: Diagnosis not present

## 2017-10-30 ENCOUNTER — Telehealth: Payer: Self-pay | Admitting: Neurology

## 2017-10-30 DIAGNOSIS — M542 Cervicalgia: Secondary | ICD-10-CM | POA: Diagnosis not present

## 2017-10-30 DIAGNOSIS — M6281 Muscle weakness (generalized): Secondary | ICD-10-CM | POA: Diagnosis not present

## 2017-10-30 DIAGNOSIS — R260 Ataxic gait: Secondary | ICD-10-CM | POA: Diagnosis not present

## 2017-10-30 DIAGNOSIS — M25511 Pain in right shoulder: Secondary | ICD-10-CM | POA: Diagnosis not present

## 2017-10-30 DIAGNOSIS — M25512 Pain in left shoulder: Secondary | ICD-10-CM | POA: Diagnosis not present

## 2017-10-30 DIAGNOSIS — M545 Low back pain: Secondary | ICD-10-CM | POA: Diagnosis not present

## 2017-10-30 NOTE — Telephone Encounter (Signed)
Received Botox authorization for patient. She is already scheduled.

## 2017-10-31 ENCOUNTER — Other Ambulatory Visit: Payer: Self-pay | Admitting: Family Medicine

## 2017-11-01 ENCOUNTER — Other Ambulatory Visit: Payer: Self-pay | Admitting: Family Medicine

## 2017-11-01 ENCOUNTER — Telehealth: Payer: Self-pay

## 2017-11-01 ENCOUNTER — Ambulatory Visit (INDEPENDENT_AMBULATORY_CARE_PROVIDER_SITE_OTHER): Payer: Medicare Other

## 2017-11-01 DIAGNOSIS — G20A1 Parkinson's disease without dyskinesia, without mention of fluctuations: Secondary | ICD-10-CM

## 2017-11-01 DIAGNOSIS — Z7901 Long term (current) use of anticoagulants: Secondary | ICD-10-CM | POA: Diagnosis not present

## 2017-11-01 DIAGNOSIS — G20 Parkinson's disease: Secondary | ICD-10-CM

## 2017-11-01 LAB — POCT INR: INR: 1.9 — AB (ref 2.0–3.0)

## 2017-11-01 MED ORDER — VENLAFAXINE HCL ER 150 MG PO CP24
150.0000 mg | ORAL_CAPSULE | Freq: Every day | ORAL | 3 refills | Status: DC
Start: 2017-11-01 — End: 2018-05-15

## 2017-11-01 MED ORDER — ATORVASTATIN CALCIUM 10 MG PO TABS: 10.0000 mg | ORAL_TABLET | Freq: Every day | ORAL | 1 refills | Status: DC

## 2017-11-01 NOTE — Telephone Encounter (Signed)
Called patient to verify directions on Coumadin. Patient to take 4 mg on 4 days and 6 mg on 3 days. Appointment scheduled for 1 week for re-check. Mailed copy of patient instructions also.

## 2017-11-01 NOTE — Patient Instructions (Addendum)
Patient advised per Dr. Lorelei Pont patient to increase to 6 mg an addittional day(3 days) and 4 mg on 4 days. Return for INR re check 1 week.

## 2017-11-01 NOTE — Progress Notes (Signed)
I have reviewed associated INR check note and agree

## 2017-11-01 NOTE — Progress Notes (Addendum)
Pre visit review using our clinic tool,if applicable. No additional management support is needed unless otherwise documented below in the visit note.   Pt here for INR check per order from Dr. Lenna Sciara. [name] dated 10/24/17.  Goal INR = 2.0-3.0  Last INR =1.5  Pt currently takes Coumadin 4 mg 5 days per week and 6 mg 2 days per week.  Pt denies recent antibiotics, no dietary changes and no unusual bruising / bleeding.  INR today = 1.9  Patient advised per Dr. Lorelei Pont patient to increase to 6 mg an addittional day(3days) and 4 mg on 4 days. Return for INR re check 1 week.  Appointment scheduled.

## 2017-11-01 NOTE — Addendum Note (Signed)
Addended by: Bunnie Domino on: 11/01/2017 12:21 PM   Modules accepted: Orders

## 2017-11-02 DIAGNOSIS — M542 Cervicalgia: Secondary | ICD-10-CM | POA: Diagnosis not present

## 2017-11-02 DIAGNOSIS — M25511 Pain in right shoulder: Secondary | ICD-10-CM | POA: Diagnosis not present

## 2017-11-02 DIAGNOSIS — M25512 Pain in left shoulder: Secondary | ICD-10-CM | POA: Diagnosis not present

## 2017-11-02 DIAGNOSIS — M6281 Muscle weakness (generalized): Secondary | ICD-10-CM | POA: Diagnosis not present

## 2017-11-02 DIAGNOSIS — R260 Ataxic gait: Secondary | ICD-10-CM | POA: Diagnosis not present

## 2017-11-02 DIAGNOSIS — M545 Low back pain: Secondary | ICD-10-CM | POA: Diagnosis not present

## 2017-11-06 DIAGNOSIS — M25511 Pain in right shoulder: Secondary | ICD-10-CM | POA: Diagnosis not present

## 2017-11-06 DIAGNOSIS — M545 Low back pain: Secondary | ICD-10-CM | POA: Diagnosis not present

## 2017-11-06 DIAGNOSIS — G20 Parkinson's disease: Secondary | ICD-10-CM | POA: Diagnosis not present

## 2017-11-06 DIAGNOSIS — M542 Cervicalgia: Secondary | ICD-10-CM | POA: Diagnosis not present

## 2017-11-06 DIAGNOSIS — M6281 Muscle weakness (generalized): Secondary | ICD-10-CM | POA: Diagnosis not present

## 2017-11-06 DIAGNOSIS — R262 Difficulty in walking, not elsewhere classified: Secondary | ICD-10-CM | POA: Diagnosis not present

## 2017-11-06 DIAGNOSIS — R260 Ataxic gait: Secondary | ICD-10-CM | POA: Diagnosis not present

## 2017-11-06 DIAGNOSIS — M25512 Pain in left shoulder: Secondary | ICD-10-CM | POA: Diagnosis not present

## 2017-11-07 ENCOUNTER — Telehealth: Payer: Self-pay | Admitting: *Deleted

## 2017-11-07 ENCOUNTER — Encounter: Payer: Self-pay | Admitting: Family Medicine

## 2017-11-07 DIAGNOSIS — M542 Cervicalgia: Secondary | ICD-10-CM | POA: Diagnosis not present

## 2017-11-07 DIAGNOSIS — I10 Essential (primary) hypertension: Secondary | ICD-10-CM

## 2017-11-07 DIAGNOSIS — M25512 Pain in left shoulder: Secondary | ICD-10-CM | POA: Diagnosis not present

## 2017-11-07 DIAGNOSIS — R260 Ataxic gait: Secondary | ICD-10-CM | POA: Diagnosis not present

## 2017-11-07 DIAGNOSIS — M545 Low back pain: Secondary | ICD-10-CM | POA: Diagnosis not present

## 2017-11-07 DIAGNOSIS — M25511 Pain in right shoulder: Secondary | ICD-10-CM | POA: Diagnosis not present

## 2017-11-07 DIAGNOSIS — M6281 Muscle weakness (generalized): Secondary | ICD-10-CM | POA: Diagnosis not present

## 2017-11-07 NOTE — Telephone Encounter (Signed)
Received Physician Orders/PT POC from [facility]; forwarded to provider/SLS 09/04

## 2017-11-07 NOTE — Telephone Encounter (Signed)
Dose patient need an appointment?

## 2017-11-08 ENCOUNTER — Ambulatory Visit (INDEPENDENT_AMBULATORY_CARE_PROVIDER_SITE_OTHER): Payer: Medicare Other

## 2017-11-08 ENCOUNTER — Other Ambulatory Visit: Payer: Self-pay | Admitting: Family Medicine

## 2017-11-08 ENCOUNTER — Other Ambulatory Visit: Payer: Self-pay

## 2017-11-08 ENCOUNTER — Telehealth: Payer: Self-pay | Admitting: Family Medicine

## 2017-11-08 DIAGNOSIS — Z7901 Long term (current) use of anticoagulants: Secondary | ICD-10-CM

## 2017-11-08 LAB — POCT INR: INR: 2.9 (ref 2.0–3.0)

## 2017-11-08 MED ORDER — HYDROCHLOROTHIAZIDE 12.5 MG PO CAPS: 12.5000 mg | ORAL_CAPSULE | Freq: Every day | ORAL | 3 refills | Status: DC

## 2017-11-08 MED FILL — HYDROCHLOROTHIAZIDE 12.5 MG: 12.5 | 90 days supply | Qty: 90 | Fill #0

## 2017-11-08 NOTE — Telephone Encounter (Unsigned)
Copied from [place] [id number]. Topic: Quick Communication - Rx Refill/Question >> Nov 08, 2017  2:47 PM Mcneil, Ja-Kwan wrote: Medication: warfarin (COUMADIN) 4 MG tablet  Has the patient contacted their pharmacy? yes   Preferred Pharmacy (with phone number or street name): [place], [place] [phone] (Phone) [phone] (Fax)  Agent: Please be advised that RX refills may take up to 3 business days. We ask that you follow-up with your pharmacy.

## 2017-11-08 NOTE — Telephone Encounter (Signed)
I spoke with pt today- she had been on HCTZ in the past for her BP.  This was stopped due to her BP being low after a recentt illness Today her BP is up again and she is having some mild edema of her ankles She has measured her home BP in the 150s/90s Recnet renal function and lytes normal rx for hctz 12.5 given today  She will come in for an INR check and BP check in 3 weeks   Meds ordered this encounter  Medications  . hydrochlorothiazide (MICROZIDE) 12.5 MG capsule    Sig: Take 1 capsule (12.5 mg total) by mouth daily.    Dispense:  90 capsule    Refill:  3

## 2017-11-08 NOTE — Progress Notes (Addendum)
Pre visit review using our clinic tool,if applicable. No additional management support is needed unless otherwise documented below in the visit note.   Pt here for INR check per  Goal INR =2.0-3.0  Last INR = 1.9  Pt currently takes Coumadin   Pt denies recent antibiotics, no dietary changes and no unusual bruising / bleeding. Patient has edema in right foot and ankle. States her daughter contacted Dr. Lorelei Pont regarding this.   INR today = 2.9  Pt advised per Dr. Lorelei Pont continue Coumadin as previously ordered. Return for INR check in 3 weeks. Dr. Lorelei Pont discussed edema and Blood Pressure with patient. Started HCTZ 12.5 mg. Patient to return for BP check on same day as INR check. Appointment scheduled.  I have reviewed the above note and agree with documentation. I also personally spoke with this pt at the time of her visit J [name] MD

## 2017-11-08 NOTE — Patient Instructions (Signed)
Pt advised per Dr. Lorelei Pont continue Coumadin as previously ordered. Return for INR check in 3 weeks. Dr. Lorelei Pont discussed edema and Blood Pressure with patient. Started HCTZ 12.5 mg. Patient to return for BP check on same day as INR check. Appointment scheduled.

## 2017-11-09 DIAGNOSIS — M25512 Pain in left shoulder: Secondary | ICD-10-CM | POA: Diagnosis not present

## 2017-11-09 DIAGNOSIS — M25511 Pain in right shoulder: Secondary | ICD-10-CM | POA: Diagnosis not present

## 2017-11-09 DIAGNOSIS — M6281 Muscle weakness (generalized): Secondary | ICD-10-CM | POA: Diagnosis not present

## 2017-11-09 DIAGNOSIS — R260 Ataxic gait: Secondary | ICD-10-CM | POA: Diagnosis not present

## 2017-11-09 DIAGNOSIS — M542 Cervicalgia: Secondary | ICD-10-CM | POA: Diagnosis not present

## 2017-11-09 DIAGNOSIS — M545 Low back pain: Secondary | ICD-10-CM | POA: Diagnosis not present

## 2017-11-11 ENCOUNTER — Other Ambulatory Visit: Payer: Self-pay | Admitting: Family Medicine

## 2017-11-13 DIAGNOSIS — M545 Low back pain: Secondary | ICD-10-CM | POA: Diagnosis not present

## 2017-11-13 DIAGNOSIS — M25512 Pain in left shoulder: Secondary | ICD-10-CM | POA: Diagnosis not present

## 2017-11-13 DIAGNOSIS — M25511 Pain in right shoulder: Secondary | ICD-10-CM | POA: Diagnosis not present

## 2017-11-13 DIAGNOSIS — M6281 Muscle weakness (generalized): Secondary | ICD-10-CM | POA: Diagnosis not present

## 2017-11-13 DIAGNOSIS — R260 Ataxic gait: Secondary | ICD-10-CM | POA: Diagnosis not present

## 2017-11-13 DIAGNOSIS — M542 Cervicalgia: Secondary | ICD-10-CM | POA: Diagnosis not present

## 2017-11-14 DIAGNOSIS — M7061 Trochanteric bursitis, right hip: Secondary | ICD-10-CM | POA: Diagnosis not present

## 2017-11-14 DIAGNOSIS — M25512 Pain in left shoulder: Secondary | ICD-10-CM | POA: Diagnosis not present

## 2017-11-14 DIAGNOSIS — R260 Ataxic gait: Secondary | ICD-10-CM | POA: Diagnosis not present

## 2017-11-14 DIAGNOSIS — M542 Cervicalgia: Secondary | ICD-10-CM | POA: Diagnosis not present

## 2017-11-14 DIAGNOSIS — M25552 Pain in left hip: Secondary | ICD-10-CM | POA: Diagnosis not present

## 2017-11-14 DIAGNOSIS — M6281 Muscle weakness (generalized): Secondary | ICD-10-CM | POA: Diagnosis not present

## 2017-11-14 DIAGNOSIS — M25551 Pain in right hip: Secondary | ICD-10-CM | POA: Diagnosis not present

## 2017-11-14 DIAGNOSIS — M545 Low back pain: Secondary | ICD-10-CM | POA: Diagnosis not present

## 2017-11-14 DIAGNOSIS — M7062 Trochanteric bursitis, left hip: Secondary | ICD-10-CM | POA: Diagnosis not present

## 2017-11-14 DIAGNOSIS — M25511 Pain in right shoulder: Secondary | ICD-10-CM | POA: Diagnosis not present

## 2017-11-16 DIAGNOSIS — M545 Low back pain: Secondary | ICD-10-CM | POA: Diagnosis not present

## 2017-11-16 DIAGNOSIS — M25511 Pain in right shoulder: Secondary | ICD-10-CM | POA: Diagnosis not present

## 2017-11-16 DIAGNOSIS — M25512 Pain in left shoulder: Secondary | ICD-10-CM | POA: Diagnosis not present

## 2017-11-16 DIAGNOSIS — M6281 Muscle weakness (generalized): Secondary | ICD-10-CM | POA: Diagnosis not present

## 2017-11-16 DIAGNOSIS — R260 Ataxic gait: Secondary | ICD-10-CM | POA: Diagnosis not present

## 2017-11-16 DIAGNOSIS — M542 Cervicalgia: Secondary | ICD-10-CM | POA: Diagnosis not present

## 2017-11-19 DIAGNOSIS — M6281 Muscle weakness (generalized): Secondary | ICD-10-CM | POA: Diagnosis not present

## 2017-11-19 DIAGNOSIS — M25511 Pain in right shoulder: Secondary | ICD-10-CM | POA: Diagnosis not present

## 2017-11-19 DIAGNOSIS — M545 Low back pain: Secondary | ICD-10-CM | POA: Diagnosis not present

## 2017-11-19 DIAGNOSIS — M25512 Pain in left shoulder: Secondary | ICD-10-CM | POA: Diagnosis not present

## 2017-11-19 DIAGNOSIS — R260 Ataxic gait: Secondary | ICD-10-CM | POA: Diagnosis not present

## 2017-11-19 DIAGNOSIS — M542 Cervicalgia: Secondary | ICD-10-CM | POA: Diagnosis not present

## 2017-11-20 DIAGNOSIS — M25511 Pain in right shoulder: Secondary | ICD-10-CM | POA: Diagnosis not present

## 2017-11-20 DIAGNOSIS — M545 Low back pain: Secondary | ICD-10-CM | POA: Diagnosis not present

## 2017-11-20 DIAGNOSIS — M6281 Muscle weakness (generalized): Secondary | ICD-10-CM | POA: Diagnosis not present

## 2017-11-20 DIAGNOSIS — M25512 Pain in left shoulder: Secondary | ICD-10-CM | POA: Diagnosis not present

## 2017-11-20 DIAGNOSIS — M542 Cervicalgia: Secondary | ICD-10-CM | POA: Diagnosis not present

## 2017-11-20 DIAGNOSIS — R260 Ataxic gait: Secondary | ICD-10-CM | POA: Diagnosis not present

## 2017-11-21 DIAGNOSIS — M542 Cervicalgia: Secondary | ICD-10-CM | POA: Diagnosis not present

## 2017-11-21 DIAGNOSIS — M25512 Pain in left shoulder: Secondary | ICD-10-CM | POA: Diagnosis not present

## 2017-11-21 DIAGNOSIS — M6281 Muscle weakness (generalized): Secondary | ICD-10-CM | POA: Diagnosis not present

## 2017-11-21 DIAGNOSIS — M545 Low back pain: Secondary | ICD-10-CM | POA: Diagnosis not present

## 2017-11-21 DIAGNOSIS — M25511 Pain in right shoulder: Secondary | ICD-10-CM | POA: Diagnosis not present

## 2017-11-21 DIAGNOSIS — R260 Ataxic gait: Secondary | ICD-10-CM | POA: Diagnosis not present

## 2017-11-22 DIAGNOSIS — M25512 Pain in left shoulder: Secondary | ICD-10-CM | POA: Diagnosis not present

## 2017-11-22 DIAGNOSIS — R260 Ataxic gait: Secondary | ICD-10-CM | POA: Diagnosis not present

## 2017-11-22 DIAGNOSIS — M545 Low back pain: Secondary | ICD-10-CM | POA: Diagnosis not present

## 2017-11-22 DIAGNOSIS — M25511 Pain in right shoulder: Secondary | ICD-10-CM | POA: Diagnosis not present

## 2017-11-22 DIAGNOSIS — M6281 Muscle weakness (generalized): Secondary | ICD-10-CM | POA: Diagnosis not present

## 2017-11-22 DIAGNOSIS — M542 Cervicalgia: Secondary | ICD-10-CM | POA: Diagnosis not present

## 2017-11-27 DIAGNOSIS — M25511 Pain in right shoulder: Secondary | ICD-10-CM | POA: Diagnosis not present

## 2017-11-27 DIAGNOSIS — M25512 Pain in left shoulder: Secondary | ICD-10-CM | POA: Diagnosis not present

## 2017-11-27 DIAGNOSIS — M542 Cervicalgia: Secondary | ICD-10-CM | POA: Diagnosis not present

## 2017-11-27 DIAGNOSIS — M6281 Muscle weakness (generalized): Secondary | ICD-10-CM | POA: Diagnosis not present

## 2017-11-27 DIAGNOSIS — M545 Low back pain: Secondary | ICD-10-CM | POA: Diagnosis not present

## 2017-11-27 DIAGNOSIS — R260 Ataxic gait: Secondary | ICD-10-CM | POA: Diagnosis not present

## 2017-11-28 DIAGNOSIS — M545 Low back pain: Secondary | ICD-10-CM | POA: Diagnosis not present

## 2017-11-28 DIAGNOSIS — M6281 Muscle weakness (generalized): Secondary | ICD-10-CM | POA: Diagnosis not present

## 2017-11-28 DIAGNOSIS — R260 Ataxic gait: Secondary | ICD-10-CM | POA: Diagnosis not present

## 2017-11-28 DIAGNOSIS — M25512 Pain in left shoulder: Secondary | ICD-10-CM | POA: Diagnosis not present

## 2017-11-28 DIAGNOSIS — M542 Cervicalgia: Secondary | ICD-10-CM | POA: Diagnosis not present

## 2017-11-28 DIAGNOSIS — M25511 Pain in right shoulder: Secondary | ICD-10-CM | POA: Diagnosis not present

## 2017-11-29 ENCOUNTER — Ambulatory Visit (INDEPENDENT_AMBULATORY_CARE_PROVIDER_SITE_OTHER): Payer: Medicare Other | Admitting: Family Medicine

## 2017-11-29 VITALS — BP 158/93 | HR 85

## 2017-11-29 DIAGNOSIS — Z23 Encounter for immunization: Secondary | ICD-10-CM

## 2017-11-29 DIAGNOSIS — Z7901 Long term (current) use of anticoagulants: Secondary | ICD-10-CM

## 2017-11-29 DIAGNOSIS — I10 Essential (primary) hypertension: Secondary | ICD-10-CM

## 2017-11-29 NOTE — Progress Notes (Addendum)
Pre visit review using our clinic tool,if applicable. No additional management support is needed unless otherwise documented below in the visit note.   Pt here for INR check and BP check per Dr Janett Billow. [name].  Goal INR =2.0-3.0  Last INR =2.9  BP = 158/93 P= 85  Pt currently takes Coumadin 4mg  4days per week and 6 mg 3 days per week   Pt denies recent antibiotics, no dietary changes and no unusual bruising / bleeding.  INR today = 3.0  Pt advised per  Continue taking  Coumadin as currently taking then return in 1 month for office visit for BP follow up and INR with provider. Appointment scheduled for 12/20/17 @ 10:30 am.   I have reviewed the above nurse visit note by Ms. Eulas Post and agree with her documentation

## 2017-11-30 DIAGNOSIS — R260 Ataxic gait: Secondary | ICD-10-CM | POA: Diagnosis not present

## 2017-11-30 DIAGNOSIS — M25511 Pain in right shoulder: Secondary | ICD-10-CM | POA: Diagnosis not present

## 2017-11-30 DIAGNOSIS — M545 Low back pain: Secondary | ICD-10-CM | POA: Diagnosis not present

## 2017-11-30 DIAGNOSIS — M25512 Pain in left shoulder: Secondary | ICD-10-CM | POA: Diagnosis not present

## 2017-11-30 DIAGNOSIS — M6281 Muscle weakness (generalized): Secondary | ICD-10-CM | POA: Diagnosis not present

## 2017-11-30 DIAGNOSIS — M542 Cervicalgia: Secondary | ICD-10-CM | POA: Diagnosis not present

## 2017-11-30 LAB — POCT INR: INR: 3 (ref 2.0–3.0)

## 2017-11-30 NOTE — Addendum Note (Signed)
Addended by: Bunnie Domino on: 11/30/2017 11:23 AM   Modules accepted: Orders

## 2017-12-03 DIAGNOSIS — M545 Low back pain: Secondary | ICD-10-CM | POA: Diagnosis not present

## 2017-12-03 DIAGNOSIS — M25512 Pain in left shoulder: Secondary | ICD-10-CM | POA: Diagnosis not present

## 2017-12-03 DIAGNOSIS — M6281 Muscle weakness (generalized): Secondary | ICD-10-CM | POA: Diagnosis not present

## 2017-12-03 DIAGNOSIS — M25511 Pain in right shoulder: Secondary | ICD-10-CM | POA: Diagnosis not present

## 2017-12-03 DIAGNOSIS — M542 Cervicalgia: Secondary | ICD-10-CM | POA: Diagnosis not present

## 2017-12-03 DIAGNOSIS — R260 Ataxic gait: Secondary | ICD-10-CM | POA: Diagnosis not present

## 2017-12-04 DIAGNOSIS — M542 Cervicalgia: Secondary | ICD-10-CM | POA: Diagnosis not present

## 2017-12-04 DIAGNOSIS — R260 Ataxic gait: Secondary | ICD-10-CM | POA: Diagnosis not present

## 2017-12-04 DIAGNOSIS — G20 Parkinson's disease: Secondary | ICD-10-CM | POA: Diagnosis not present

## 2017-12-04 DIAGNOSIS — M545 Low back pain: Secondary | ICD-10-CM | POA: Diagnosis not present

## 2017-12-04 DIAGNOSIS — R262 Difficulty in walking, not elsewhere classified: Secondary | ICD-10-CM | POA: Diagnosis not present

## 2017-12-04 DIAGNOSIS — M6281 Muscle weakness (generalized): Secondary | ICD-10-CM | POA: Diagnosis not present

## 2017-12-04 DIAGNOSIS — M25512 Pain in left shoulder: Secondary | ICD-10-CM | POA: Diagnosis not present

## 2017-12-04 DIAGNOSIS — M25511 Pain in right shoulder: Secondary | ICD-10-CM | POA: Diagnosis not present

## 2017-12-05 DIAGNOSIS — M25512 Pain in left shoulder: Secondary | ICD-10-CM | POA: Diagnosis not present

## 2017-12-05 DIAGNOSIS — R260 Ataxic gait: Secondary | ICD-10-CM | POA: Diagnosis not present

## 2017-12-05 DIAGNOSIS — M545 Low back pain: Secondary | ICD-10-CM | POA: Diagnosis not present

## 2017-12-05 DIAGNOSIS — M25511 Pain in right shoulder: Secondary | ICD-10-CM | POA: Diagnosis not present

## 2017-12-05 DIAGNOSIS — M6281 Muscle weakness (generalized): Secondary | ICD-10-CM | POA: Diagnosis not present

## 2017-12-05 DIAGNOSIS — M542 Cervicalgia: Secondary | ICD-10-CM | POA: Diagnosis not present

## 2017-12-06 DIAGNOSIS — M6281 Muscle weakness (generalized): Secondary | ICD-10-CM | POA: Diagnosis not present

## 2017-12-06 DIAGNOSIS — M25511 Pain in right shoulder: Secondary | ICD-10-CM | POA: Diagnosis not present

## 2017-12-06 DIAGNOSIS — M542 Cervicalgia: Secondary | ICD-10-CM | POA: Diagnosis not present

## 2017-12-06 DIAGNOSIS — M25512 Pain in left shoulder: Secondary | ICD-10-CM | POA: Diagnosis not present

## 2017-12-06 DIAGNOSIS — R260 Ataxic gait: Secondary | ICD-10-CM | POA: Diagnosis not present

## 2017-12-06 DIAGNOSIS — M545 Low back pain: Secondary | ICD-10-CM | POA: Diagnosis not present

## 2017-12-09 ENCOUNTER — Other Ambulatory Visit: Payer: Self-pay

## 2017-12-09 ENCOUNTER — Encounter (HOSPITAL_BASED_OUTPATIENT_CLINIC_OR_DEPARTMENT_OTHER): Payer: Self-pay | Admitting: Emergency Medicine

## 2017-12-09 ENCOUNTER — Emergency Department (HOSPITAL_BASED_OUTPATIENT_CLINIC_OR_DEPARTMENT_OTHER)
Admission: EM | Admit: 2017-12-09 | Discharge: 2017-12-09 | Disposition: A | Discharge status: Home / Self Care | Payer: Medicare Other | Attending: Emergency Medicine | Admitting: Emergency Medicine

## 2017-12-09 DIAGNOSIS — E039 Hypothyroidism, unspecified: Secondary | ICD-10-CM | POA: Diagnosis not present

## 2017-12-09 DIAGNOSIS — Z79899 Other long term (current) drug therapy: Secondary | ICD-10-CM | POA: Diagnosis not present

## 2017-12-09 DIAGNOSIS — Z96653 Presence of artificial knee joint, bilateral: Secondary | ICD-10-CM | POA: Diagnosis not present

## 2017-12-09 DIAGNOSIS — R197 Diarrhea, unspecified: Secondary | ICD-10-CM | POA: Insufficient documentation

## 2017-12-09 DIAGNOSIS — K625 Hemorrhage of anus and rectum: Secondary | ICD-10-CM

## 2017-12-09 DIAGNOSIS — Z87891 Personal history of nicotine dependence: Secondary | ICD-10-CM | POA: Diagnosis not present

## 2017-12-09 DIAGNOSIS — I10 Essential (primary) hypertension: Secondary | ICD-10-CM | POA: Insufficient documentation

## 2017-12-09 DIAGNOSIS — G20 Parkinson's disease: Secondary | ICD-10-CM | POA: Diagnosis not present

## 2017-12-09 DIAGNOSIS — Z7901 Long term (current) use of anticoagulants: Secondary | ICD-10-CM | POA: Diagnosis not present

## 2017-12-09 LAB — COMPREHENSIVE METABOLIC PANEL
ALK PHOS: 117 U/L (ref 38–126)
ALT: 10 U/L (ref 0–44)
AST: 25 U/L (ref 15–41)
Albumin: 3.8 g/dL (ref 3.5–5.0)
Anion gap: 10 (ref 5–15)
BILIRUBIN TOTAL: 0.8 mg/dL (ref 0.3–1.2)
BUN: 16 mg/dL (ref 8–23)
CO2: 26 mmol/L (ref 22–32)
CREATININE: 0.75 mg/dL (ref 0.44–1.00)
Calcium: 9.3 mg/dL (ref 8.9–10.3)
Chloride: 101 mmol/L (ref 98–111)
GFR calc Af Amer: >60 mL/min (ref >60)
Glucose, Bld: 131 mg/dL — ABNORMAL HIGH (ref 70–99)
Potassium: 3.3 mmol/L — ABNORMAL LOW (ref 3.5–5.1)
Sodium: 137 mmol/L (ref 135–145)
TOTAL PROTEIN: 7 g/dL (ref 6.5–8.1)

## 2017-12-09 LAB — CBC
HCT: 46.3 % — ABNORMAL HIGH (ref 36.0–46.0)
Hemoglobin: 15.7 g/dL — ABNORMAL HIGH (ref 12.0–15.0)
MCH: 29 pg (ref 26.0–34.0)
MCHC: 33.9 g/dL (ref 30.0–36.0)
MCV: 85.4 fL (ref 78.0–100.0)
PLATELETS: 240 10*3/uL (ref 150–400)
RBC: 5.42 MIL/uL — ABNORMAL HIGH (ref 3.87–5.11)
RDW: 12.8 % (ref 11.5–15.5)
WBC: 12.2 10*3/uL — AB (ref 4.0–10.5)

## 2017-12-09 LAB — PROTIME-INR
INR: 2.78
Prothrombin Time: 29.1 seconds — ABNORMAL HIGH (ref 11.4–15.2)

## 2017-12-09 LAB — OCCULT BLOOD X 1 CARD TO LAB, STOOL: FECAL OCCULT BLD: POSITIVE — AB

## 2017-12-09 MED ORDER — SODIUM CHLORIDE 0.9 % IV BOLUS
1000.0000 mL | Freq: Once | INTRAVENOUS | Status: AC
  Administered 2017-12-09: 1000 mL via INTRAVENOUS

## 2017-12-09 NOTE — Discharge Instructions (Addendum)
It was our pleasure to provide your ER care today - we hope that you feel better.  Rest. Drink adequate fluids.  Your coumadin level/INR today is 2.8, your blood count/hemoglobin is normal.   Hold/do not take your coumadin for the next day - if bleeding resolved, resume your normal coumadin dose then.   Follow up with your doctor/gi doctor in the next few days - call office tomorrow to arrange follow up.  Return to [facility] ER if worse, recurrent/heavy bleeding, or other concern.

## 2017-12-09 NOTE — ED Provider Notes (Signed)
[facility]ER[facility] DEPARTMENT Provider Note   CSN: [id number] Arrival date & time: 12/09/17  1505     History   Chief Complaint Chief Complaint  Patient presents with  . Rectal Bleeding  . Diarrhea    HPI [name] [name] is a 72 y.o. female.  Patient c/o diarrhea onset yesterday, several episodes, and today noted bright red blood per rectum. Symptoms episodic, moderate. No hx gi bleed. No hx diverticula, or pud. Is on coumadin as hx PE. No other abnormal bruising or bleeding. No faintness or dizziness. No rectal pain. No abd pain. No vomiting.   The history is provided by the patient.  Rectal Bleeding  Associated symptoms: no abdominal pain, no epistaxis, no fever and no vomiting   Diarrhea   Pertinent negatives include no abdominal pain, no vomiting and no headaches.    Past Medical History:  Diagnosis Date  . Arthritis   . Chronic insomnia   . Complication of anesthesia    severe itching night of surgery requiring meds- also couldnt swallow- throat "paralyzed""  . Fibromyalgia   . Glaucoma   . Hypercholesterolemia   . Hyperlipidemia   . Hypertension    PCP Dr Cari Caraway- [facility]  05/15/11 with clearance and note on chart,    chest x ray, EKG 12/12 EPIC, eccho 7/11 EPIC  . Hypothyroidism    s/p graves disease  . Kidney stone   . PD (Parkinson's disease) ([place])   . Peripheral vascular disease ([place])    PULMONARY EMBOLUS x 2- 2011, 2012/ FOLLOWED BY DR ODOGWU-LOV 12/12 EPIC   PT STAES WILL STOP COUMADIN 3/12 and begin LOVONOX 05/17/11 as previously instructed  . Prediabetes   . Sinus infection    at present- dr Honor Loh PA aware- was seen there and at PCP 05/10/11  . Thyroid disease    Hypothyroidism    Patient Active Problem List   Diagnosis Date Noted  . Acute cystitis without hematuria   . Glaucoma 10/13/2017  . Transient cerebral ischemic attack 10/13/2017  . History of prediabetes 10/13/2017  . Sepsis secondary to UTI ([place]) 02/12/2017  . Elevated  troponin 02/12/2017  . AKI (acute kidney injury) ([place]) 02/12/2017  . Recurrent UTI 11/23/2016  . Fall at nursing home 11/23/2016  . PD (Parkinson's disease) ([place]) 11/23/2016  . Sepsis due to urinary tract infection ([place]) 11/23/2016  . Fibromyalgia 11/23/2016  . Hypercholesterolemia 11/23/2016  . Hypertension 11/23/2016  . Hypothyroidism, adult 11/23/2016  . Elevated CK 11/23/2016  . Pulmonary HTN ([place]) 11/23/2016  . Left ventricular diastolic dysfunction 40/98/1191  . Metabolic encephalopathy 47/82/9562  . History of pulmonary embolus (PE) 2/2 hypercoagulable state 11/23/2016  . Sepsis ([place]) 09/17/2016  . Acute metabolic encephalopathy 13/10/6576  . Cellulitis of right foot 09/17/2016  . Fall   . SIRS (systemic inflammatory response syndrome) ([place]) 04/29/2016  . Hyperlipidemia 04/29/2016  . Hypothyroidism 04/29/2016  . Hematuria 04/05/2016  . Cough 04/05/2016  . Benign essential HTN   . Intractable pain 12/17/2015  . Displaced fracture of fifth cervical vertebra ([place]) 12/17/2015  . Gait disturbance 12/17/2015  . Trochanteric bursitis of both hips 09/13/2015  . Chronic low back pain 06/07/2015  . Fibromyalgia syndrome 05/17/2015  . Thyroid disease 05/17/2015  . Parkinson's disease ([place]) 05/17/2015  . Hypercoagulable state ([place]) 02/07/2012  . History of pulmonary embolism 02/07/2012  . S/P right TKA 05/22/2011    Past Surgical History:  Procedure Laterality Date  . ABDOMINAL HYSTERECTOMY    . APPENDECTOMY    .  CATARACT EXTRACTION Bilateral   . CHOLECYSTECTOMY    . COLONOSCOPY    . EXPLORATORY LAPAROTOMY    . JOINT REPLACEMENT     left knee  7/12  . TOTAL KNEE ARTHROPLASTY  05/22/2011   Procedure: TOTAL KNEE ARTHROPLASTY;  Surgeon: Mauri Pole, MD;  Location: [facility];  Service: Orthopedics;  Laterality: Right;     OB History   None      Home Medications    Prior to Admission medications   Medication Sig Start Date End Date Taking? Authorizing Provider    acetaminophen (TYLENOL) 500 MG tablet Take 500 mg by mouth every 6 (six) hours as needed for mild pain.    [provider]  atorvastatin (LIPITOR) 10 MG tablet Take 1 tablet (10 mg total) by mouth daily. 11/01/17   [name], [name], MD  carbidopa-levodopa (SINEMET IR) 25-100 MG tablet 2 in the morning, 1 in the afternoon, 1 in the evening Patient taking differently: Take 1-2 tablets by mouth See admin instructions. Take 2 tablets in the morning then take 1 tablet  in the afternoon and take 1 tablet  in the evening 10/09/17   Tat, Rebecca S, DO  Cholecalciferol (VITAMIN D3) 5000 UNITS CAPS Take 5,000 Units by mouth daily.     [provider]  cyanocobalamin 500 MCG tablet Take 500 mcg by mouth daily.    [provider]  hydrochlorothiazide (MICROZIDE) 12.5 MG capsule Take 1 capsule (12.5 mg total) by mouth daily. 11/08/17   [name], [name], MD  ketoconazole (NIZORAL) 2 % shampoo Apply 1 application topically 2 (two) times a week. 04/23/17   [name], [name], MD  magnesium oxide (MAG-OX) 400 (241.3 Mg) MG tablet Take 0.5 tablets (200 mg total) by mouth daily. 12/21/15   Robbie Lis, MD  Melatonin 5 MG TABS Take 1 tablet by mouth at bedtime as needed (for sleep).    [provider]  Menthol, Topical Analgesic, (BIOFREEZE EX) Apply 1 application topically daily as needed (for pain).    [provider]  nicotine polacrilex (NICORETTE) 2 MG gum Take 2 mg by mouth as needed for smoking cessation.    [provider]  ondansetron (ZOFRAN-ODT) 4 MG disintegrating tablet DISSOLVE 1 TABLET IN MOUTH EVERY 8 HOURS AS NEEDED FOR NAUSEA AND VOMITING 10/24/17   [name], [name], MD  SYNTHROID 150 MCG tablet Take 1 tablet (150 mcg total) by mouth daily before breakfast. 08/10/17   [name], [name], MD  venlafaxine XR (EFFEXOR-XR) 150 MG 24 hr capsule Take 1 capsule (150 mg total) by mouth daily with breakfast. 11/01/17   [name], [name], MD  warfarin  (COUMADIN) 4 MG tablet USE AS DIRECTED BY MD 11/08/17   [name], [name], MD  warfarin (COUMADIN) 4 MG tablet Take 1 tablet (4 mg total) by mouth daily. Adjust as directed by MD 11/12/17   [name], [name], MD  zolpidem (AMBIEN) 5 MG tablet Take 1 tablet (5 mg total) by mouth at bedtime as needed for sleep. Pt requests brand only. Do not combine with any other sedating substance 05/28/17   [name], [name], MD    Family History Family History  Problem Relation Age of Onset  . Prostate cancer Father   . Stroke Father     Social History Social History   Tobacco Use  . Smoking status: Former Smoker    Packs/day: 1.00    Last attempt to quit: 05/10/1989    Years since quitting: 28.6  .  Smokeless tobacco: Never Used  Substance Use Topics  . Alcohol use: Yes    Alcohol/week: 0.0 standard drinks    Comment: one drink once a month  . Drug use: No     Allergies   Crestor [rosuvastatin calcium]; Erythromycin; Gabapentin; Pregabalin; Macrobid [nitrofurantoin]; and Losartan   Review of Systems Review of Systems  Constitutional: Negative for fever.  HENT: Negative for nosebleeds.   Eyes: Negative for redness.  Respiratory: Negative for shortness of breath.   Cardiovascular: Negative for chest pain.  Gastrointestinal: Positive for diarrhea and hematochezia. Negative for abdominal pain and vomiting.  Genitourinary: Negative for flank pain.  Musculoskeletal: Negative for back pain.  Skin: Negative for rash.  Neurological: Negative for syncope and headaches.  Hematological:       On coumadin, no other abnormal bruising/bleeding.   Psychiatric/Behavioral: Negative for confusion.     Physical Exam Updated Vital Signs BP 123/71   Pulse (!) 102   Temp 98.2 F (36.8 C) (Oral)   Resp 18   Ht 1.664 m (5' 5.5")   Wt 88.5 kg   SpO2 94%   BMI 31.96 kg/m   Physical Exam  Constitutional: She appears well-developed and well-nourished.  HENT:  Mouth/Throat: Oropharynx is clear and  moist.  Eyes: Conjunctivae are normal. No scleral icterus.  Neck: Neck supple. No tracheal deviation present.  Cardiovascular: Normal rate, regular rhythm, normal heart sounds and intact distal pulses. Exam reveals no gallop and no friction rub.  No murmur heard. Pulmonary/Chest: Effort normal and breath sounds normal. No respiratory distress.  Abdominal: Soft. Normal appearance and bowel sounds are normal. She exhibits no distension and no mass. There is no tenderness. There is no guarding.  Genitourinary:  Genitourinary Comments: Rectal  - no mass felt, no external hemorrhoids or fissure noted. Scant amount red blood on rectal, heme pos.   Musculoskeletal: She exhibits no edema.  Neurological: She is alert.  Skin: Skin is warm and dry. No rash noted.  Psychiatric: She has a normal mood and affect.  Nursing note and vitals reviewed.    ED Treatments / Results  Labs (all labs ordered are listed, but only abnormal results are displayed) Results for orders placed or performed during the hospital encounter of 12/09/17  Comprehensive metabolic panel  Result Value Ref Range   Sodium 137 135 - 145 mmol/L   Potassium 3.3 (L) 3.5 - 5.1 mmol/L   Chloride 101 98 - 111 mmol/L   CO2 26 22 - 32 mmol/L   Glucose, Bld 131 (H) 70 - 99 mg/dL   BUN 16 8 - 23 mg/dL   Creatinine, Ser 0.75 0.44 - 1.00 mg/dL   Calcium 9.3 8.9 - 10.3 mg/dL   Total Protein 7.0 6.5 - 8.1 g/dL   Albumin 3.8 3.5 - 5.0 g/dL   AST 25 15 - 41 U/L   ALT 10 0 - 44 U/L   Alkaline Phosphatase 117 38 - 126 U/L   Total Bilirubin 0.8 0.3 - 1.2 mg/dL   GFR calc non Af Amer >60 >60 mL/min   GFR calc Af Amer >60 >60 mL/min   Anion gap 10 5 - 15  CBC  Result Value Ref Range   WBC 12.2 (H) 4.0 - 10.5 K/uL   RBC 5.42 (H) 3.87 - 5.11 MIL/uL   Hemoglobin 15.7 (H) 12.0 - 15.0 g/dL   HCT 46.3 (H) 36.0 - 46.0 %   MCV 85.4 78.0 - 100.0 fL   MCH 29.0 26.0 - 34.0 pg  MCHC 33.9 30.0 - 36.0 g/dL   RDW 12.8 11.5 - 15.5 %   Platelets  240 150 - 400 K/uL  Protime-INR - (order if Patient is taking Coumadin / Warfarin)  Result Value Ref Range   Prothrombin Time 29.1 (H) 11.4 - 15.2 seconds   INR 2.78   Occult blood card to lab, stool  Result Value Ref Range   Fecal Occult Bld POSITIVE (A) NEGATIVE    EKG None  Radiology No results found.  Procedures Procedures (including critical care time)  Medications Ordered in ED Medications - No data to display   Initial Impression / Assessment and Plan / ED Course  I have reviewed the triage vital signs and the nursing notes.  Pertinent labs & imaging results that were available during my care of the patient were reviewed by me and considered in my medical decision making (see chart for details).  Labs sent. 1 liter ns iv.   Reviewed nursing notes and prior charts for additional history.   Labs reviewed  - hgb normal. Bun normal. inr 2.8 - pt takes coumadin in Am, did not take today - pt instructed to hold/not take, todays dose.   Pt with brbpr post episodes diarrhea. No recurrent/active bleeding while in ED.  No orthostatic drop in bp. No faintness or dizziness.   During approx 3 hr observation in ED, pt asymptomatic, no recurrent bleeding.   Pt currently appears stable for d/c.   Has seen [facility] in past - rec close f/u pcp and gi.   Return precautions provided.     Final Clinical Impressions(s) / ED Diagnoses   Final diagnoses:  None    ED Discharge Orders    None       Lajean Saver, MD 12/09/17 [phone]

## 2017-12-09 NOTE — ED Notes (Signed)
Pt laying flat for orthostatic vitals.  

## 2017-12-09 NOTE — ED Triage Notes (Signed)
Pt c/o diarrhea and rectal bleeding onset yesterday. Pt is taking coumadin.

## 2017-12-12 DIAGNOSIS — R260 Ataxic gait: Secondary | ICD-10-CM | POA: Diagnosis not present

## 2017-12-12 DIAGNOSIS — M542 Cervicalgia: Secondary | ICD-10-CM | POA: Diagnosis not present

## 2017-12-12 DIAGNOSIS — M545 Low back pain: Secondary | ICD-10-CM | POA: Diagnosis not present

## 2017-12-12 DIAGNOSIS — M6281 Muscle weakness (generalized): Secondary | ICD-10-CM | POA: Diagnosis not present

## 2017-12-12 DIAGNOSIS — M25511 Pain in right shoulder: Secondary | ICD-10-CM | POA: Diagnosis not present

## 2017-12-12 DIAGNOSIS — M25512 Pain in left shoulder: Secondary | ICD-10-CM | POA: Diagnosis not present

## 2017-12-13 ENCOUNTER — Ambulatory Visit (HOSPITAL_BASED_OUTPATIENT_CLINIC_OR_DEPARTMENT_OTHER)
Admission: RE | Admit: 2017-12-13 | Discharge: 2017-12-13 | Disposition: A | Discharge status: Home / Self Care | Payer: Medicare Other | Source: Ambulatory Visit | Attending: Family Medicine | Admitting: Family Medicine

## 2017-12-13 DIAGNOSIS — Z780 Asymptomatic menopausal state: Secondary | ICD-10-CM | POA: Insufficient documentation

## 2017-12-13 DIAGNOSIS — Z13820 Encounter for screening for osteoporosis: Secondary | ICD-10-CM | POA: Insufficient documentation

## 2017-12-13 DIAGNOSIS — M85852 Other specified disorders of bone density and structure, left thigh: Secondary | ICD-10-CM | POA: Diagnosis not present

## 2017-12-13 DIAGNOSIS — Z87891 Personal history of nicotine dependence: Secondary | ICD-10-CM | POA: Diagnosis not present

## 2017-12-13 DIAGNOSIS — Z1239 Encounter for other screening for malignant neoplasm of breast: Secondary | ICD-10-CM

## 2017-12-13 DIAGNOSIS — E2839 Other primary ovarian failure: Secondary | ICD-10-CM

## 2017-12-13 DIAGNOSIS — Z1231 Encounter for screening mammogram for malignant neoplasm of breast: Secondary | ICD-10-CM | POA: Diagnosis not present

## 2017-12-14 DIAGNOSIS — R260 Ataxic gait: Secondary | ICD-10-CM | POA: Diagnosis not present

## 2017-12-14 DIAGNOSIS — M25512 Pain in left shoulder: Secondary | ICD-10-CM | POA: Diagnosis not present

## 2017-12-14 DIAGNOSIS — M25511 Pain in right shoulder: Secondary | ICD-10-CM | POA: Diagnosis not present

## 2017-12-14 DIAGNOSIS — M6281 Muscle weakness (generalized): Secondary | ICD-10-CM | POA: Diagnosis not present

## 2017-12-14 DIAGNOSIS — M545 Low back pain: Secondary | ICD-10-CM | POA: Diagnosis not present

## 2017-12-14 DIAGNOSIS — M542 Cervicalgia: Secondary | ICD-10-CM | POA: Diagnosis not present

## 2017-12-18 ENCOUNTER — Encounter: Payer: Self-pay | Admitting: Family Medicine

## 2017-12-18 DIAGNOSIS — M545 Low back pain: Secondary | ICD-10-CM | POA: Diagnosis not present

## 2017-12-18 DIAGNOSIS — R260 Ataxic gait: Secondary | ICD-10-CM | POA: Diagnosis not present

## 2017-12-18 DIAGNOSIS — M6281 Muscle weakness (generalized): Secondary | ICD-10-CM | POA: Diagnosis not present

## 2017-12-18 DIAGNOSIS — M25511 Pain in right shoulder: Secondary | ICD-10-CM | POA: Diagnosis not present

## 2017-12-18 DIAGNOSIS — M8580 Other specified disorders of bone density and structure, unspecified site: Secondary | ICD-10-CM

## 2017-12-18 DIAGNOSIS — M542 Cervicalgia: Secondary | ICD-10-CM | POA: Diagnosis not present

## 2017-12-18 DIAGNOSIS — M25512 Pain in left shoulder: Secondary | ICD-10-CM | POA: Diagnosis not present

## 2017-12-18 HISTORY — DX: Other specified disorders of bone density and structure, unspecified site: M85.80

## 2017-12-18 NOTE — Progress Notes (Signed)
[facility] at [facility] [address], [place], [place] [address] [phone]  Date:  12/20/2017   Name:  [name] [name]   DOB:  [birth year]   MRN:  [id number]  PCP:  Darreld Mclean, MD    Chief Complaint: Blood Pressure Check (would like to restart bp medication, has been running high at home, headache) and INR check   History of Present Illness:  [name] [name] is a 72 y.o. very pleasant female patient who presents with the following: She was in the ER on 10/6 with rectal bleeding following several episodes of diarrhea.  She was evaluated and released to [facility] Following up on her BP and INR today History of Parkinson's disease, hypercoagulability Lab Results  Component Value Date   INR 1.8 (A) 12/20/2017   INR 2.78 12/09/2017   INR 3.0 11/30/2017   PROTIME 32.4 (H) 02/14/2011   PROTIME 36.0 (H) 07/26/2010   I last saw her in August when she was getting out of the hospital following UTI and related fall at home   lipitor Sinemet hctz Magnesium Synthroid 150 effexor xr Coumadin She is on chronic keflex - per urology- for the last  2-3 months. She has not had any further UTI since using this daily  Reviewed most recent urology note from August of this year- Dr. Matilde Sprang.  Daily Keflex, he had requested re-assess in 8 weeks  No further blood in her stool, diarrhea is resolved  current coumadin regimen  6 mg 3x a week 4 mg 4x a week  Total weekly dose = 34  Will adjust by 2 mg as INR is low today =  36 mg a day   Lab Results  Component Value Date   TSH 0.04 (L) 12/20/2017   BP Readings from Last 3 Encounters:  12/20/17 (!) 144/80  12/09/17 (!) 156/82  11/29/17 (!) 158/93   Her BP was 156/89 yesterday She stooped using hctz due to urinary sx  Nazanin is worried about her BP and would like to go back on BP med-  Will try amlodipine for her at 2.5 mg   She is moving to a new assisted living facility next week  and is excited about this change   Kaylani's daughter is with her today and contributes to visit, also her nearly 68 yo grandson Keslie does note that her eyes will cross more easily/ frequently over the last couple of weeks. She is able to put them straight again and her vision is ok as long as her eyes are not crossed. She is seeing Dr. [name] next week and will be sure to mention this to her  I will also send Dr. [name] a message about this finding   Jeffifer also notes that she is nearly out of her [place]. We have tried to get her off this medication but have not been successful. Pt is not able to sleep without it  Ok to refill today, [place] reviewed as below [place]:  11/22/2017  1  09/17/2017  Zolpidem Tartrate 5 Mg Tablet  30.00 30 Je Cop  [id number]  [facility] ([id number])  2/2 0.25 LME Medicare  [place]  10/24/2017  1  09/17/2017  Zolpidem Tartrate 5 Mg Tablet  30.00 30 Je Cop  [id number]  [facility] ([id number])  1/2 0.25 LME Medicare  [place]  09/24/2017  1  09/17/2017  Zolpidem Tartrate 5 Mg Tablet  30.00 30 Je Cop  [id number]  [facility] ([id number])  0/2  0.25 LME Medicare  [place]  08/28/2017  1  05/09/2017  Zolpidem Tartrate 5 Mg Tablet  30.00 30 Je Cop  [id number]  [facility] ([id number])  2/2 0.25 LME Medicare  [place]  07/31/2017  1  05/09/2017  Zolpidem Tartrate 5 Mg Tablet  30.00 30 Je Cop  [id number]  [facility] ([id number])  1/2 0.25 LME Medicare  [address]  07/04/2017  1  05/28/2017  Zolpidem Tartrate 5 Mg Tablet  30.00 30 Je Cop  [id number]  [facility] ([id number])  1/1 0.25 LME Medicare  [place]  06/15/2017  1  06/13/2017  Hydrocodone-Acetamin 2.5-325  14.00 7 Ma Bab  [id number]  Med ([id number])  0/0        Patient Active Problem List   Diagnosis Date Noted  . Osteopenia 12/18/2017  . Glaucoma 10/13/2017  . Transient cerebral ischemic attack 10/13/2017  . History of prediabetes 10/13/2017  . Sepsis secondary to UTI ([place]) 02/12/2017  . AKI (acute kidney injury) ([place]) 02/12/2017  . Recurrent UTI 11/23/2016  . PD (Parkinson's disease) ([place]) 11/23/2016  . Fibromyalgia 11/23/2016  . Hypercholesterolemia 11/23/2016   . Hypertension 11/23/2016  . Hypothyroidism, adult 11/23/2016  . Pulmonary HTN ([place]) 11/23/2016  . Left ventricular diastolic dysfunction 00/17/4944  . History of pulmonary embolus (PE) 2/2 hypercoagulable state 11/23/2016  . Sepsis ([place]) 09/17/2016  . SIRS (systemic inflammatory response syndrome) ([place]) 04/29/2016  . Hyperlipidemia 04/29/2016  . Hypothyroidism 04/29/2016  . Hematuria 04/05/2016  . Benign essential HTN   . Displaced fracture of fifth cervical vertebra ([place]) 12/17/2015  . Gait disturbance 12/17/2015  . Trochanteric bursitis of both hips 09/13/2015  . Chronic low back pain 06/07/2015  . Thyroid disease 05/17/2015  . Parkinson's disease ([place]) 05/17/2015  . S/P right TKA 05/22/2011    Past Medical History:  Diagnosis Date  . Arthritis   . Chronic insomnia   . Complication of anesthesia    severe itching night of surgery requiring meds- also couldnt swallow- throat "paralyzed""  . Fibromyalgia   . Glaucoma   . Hypercholesterolemia   . Hyperlipidemia   . Hypertension    PCP Dr Cari Caraway- [place] [facility]  05/15/11 with clearance and note on chart,    chest x ray, EKG 12/12 EPIC, eccho 7/11 EPIC  . Hypothyroidism    s/p graves disease  . Kidney stone   . PD (Parkinson's disease) ([place])   . Peripheral vascular disease ([place])    PULMONARY EMBOLUS x 2- 2011, 2012/ FOLLOWED BY DR ODOGWU-LOV 12/12 EPIC   PT STAES WILL STOP COUMADIN 3/12 and begin LOVONOX 05/17/11 as previously instructed  . Prediabetes   . Sinus infection    at present- dr Honor Loh PA aware- was seen there and at PCP 05/10/11  . Thyroid disease    Hypothyroidism    Past Surgical History:  Procedure Laterality Date  . ABDOMINAL HYSTERECTOMY    . APPENDECTOMY    . CATARACT EXTRACTION Bilateral   . CHOLECYSTECTOMY    . COLONOSCOPY    . EXPLORATORY LAPAROTOMY    . JOINT REPLACEMENT     left knee  7/12  . TOTAL KNEE ARTHROPLASTY  05/22/2011   Procedure: TOTAL KNEE ARTHROPLASTY;  Surgeon: Mauri Pole, MD;   Location: [facility];  Service: Orthopedics;  Laterality: Right;    Social History   Tobacco Use  . Smoking status: Former Smoker    Packs/day: 1.00    Last attempt to quit: 05/10/1989    Years since quitting: 28.6  . Smokeless tobacco: Never Used  Substance Use Topics  . Alcohol use: Yes    Alcohol/week: 0.0 standard drinks    Comment: one drink once a month  . Drug use: No    Family History  Problem Relation Age of Onset  . Prostate cancer Father   . Stroke Father     Allergies  Allergen Reactions  . Crestor [Rosuvastatin Calcium] Other (See Comments)    Feeling very bad, aching all over.  . Erythromycin Hives  . Gabapentin Other (See Comments)    Blurred vision  . Pregabalin Other (See Comments)    Crossed vision.  . Pregabalin Other (See Comments)    Crossed vision. Unknown   . Macrobid [Nitrofurantoin] Hives  . Losartan Itching    Medication list has been reviewed and updated.  Current Outpatient Medications on File Prior to Visit  Medication Sig Dispense Refill  . acetaminophen (TYLENOL) 500 MG tablet Take 500 mg by mouth every 6 (six) hours as needed for mild pain.    Marland Kitchen atorvastatin (LIPITOR) 10 MG tablet Take 1 tablet (10 mg total) by mouth daily. 90 tablet 1  . carbidopa-levodopa (SINEMET IR) 25-100 MG tablet 2 in the morning, 1 in the afternoon, 1 in the evening (Patient taking differently: Take 1-2 tablets by mouth See admin instructions. Take 2 tablets in the morning then take 1 tablet  in the afternoon and take 1 tablet  in the evening) 360 tablet 1  . cephALEXin (KEFLEX) 250 MG capsule Take 250 mg by mouth daily.  11  . Cholecalciferol (VITAMIN D3) 5000 UNITS CAPS Take 5,000 Units by mouth daily.     . cyanocobalamin 500 MCG tablet Take 500 mcg by mouth daily.    Marland Kitchen ketoconazole (NIZORAL) 2 % shampoo Apply 1 application topically 2 (two) times a week. 120 mL 3  . magnesium oxide (MAG-OX) 400 (241.3 Mg) MG tablet Take 0.5 tablets (200 mg total) by mouth  daily. 30 tablet 0  . Melatonin 5 MG TABS Take 1 tablet by mouth at bedtime as needed (for sleep).    . Menthol, Topical Analgesic, (BIOFREEZE EX) Apply 1 application topically daily as needed (for pain).    . nicotine polacrilex (NICORETTE) 2 MG gum Take 2 mg by mouth as needed for smoking cessation.    . ondansetron (ZOFRAN-ODT) 4 MG disintegrating tablet DISSOLVE 1 TABLET IN MOUTH EVERY 8 HOURS AS NEEDED FOR NAUSEA AND VOMITING 90 tablet 0  . venlafaxine XR (EFFEXOR-XR) 150 MG 24 hr capsule Take 1 capsule (150 mg total) by mouth daily with breakfast. 90 capsule 3  . warfarin (COUMADIN) 4 MG tablet USE AS DIRECTED BY MD 90 tablet 0  . warfarin (COUMADIN) 4 MG tablet Take 1 tablet (4 mg total) by mouth daily. Adjust as directed by MD 90 tablet 1   No current facility-administered medications on file prior to visit.     Review of Systems:  As per HPI- otherwise negative. No fever or chills Overall she is feeling pretty ok    Physical Examination: Vitals:   12/20/17 1035  BP: (!) 144/80  Pulse: 95  Temp: 97.7 F (36.5 C)  SpO2: 97%   Vitals:   12/20/17 1035  Weight: 197 lb 6.4 oz (89.5 kg)  Height: 5' 5.5" (1.664 m)   Body mass index is 32.35 kg/m. Ideal Body Weight: Weight in (lb) to have BMI = 25: 152.2  GEN: WDWN, NAD, Non-toxic, A & O x 3, looks her normal self, obese HEENT: Atraumatic, Normocephalic. Neck supple. No  masses, No LAD.  Pt demonstrates her eyes crossing but can correct this easily Ears and Nose: No external deformity. CV: RRR, No M/G/R. No JVD. No thrill. No extra heart sounds. PULM: CTA B, no wheezes, crackles, rhonchi. No retractions. No resp. distress. No accessory muscle use. ABD: S, NT, ND EXTR: No c/c/e NEURO Normal gait for pt- typical of PD, she uses a walker  PSYCH: Normally interactive. Conversant. Not depressed or anxious appearing.  Calm demeanor.   Wt Readings from Last 3 Encounters:  12/20/17 197 lb 6.4 oz (89.5 kg)  12/09/17 195 lb  (88.5 kg)  10/24/17 198 lb (89.8 kg)    Assessment and Plan: Medication monitoring encounter - Plan: POCT INR  Essential hypertension - Plan: Basic metabolic panel, CBC, amLODipine (NORVASC) 2.5 MG tablet  Long term (current) use of anticoagulants - Plan: POCT INR  Parkinson disease ([place])  Hypercoagulable state ([place])  Hypothyroidism, adult - Plan: TSH, levothyroxine (SYNTHROID, LEVOTHROID) 125 MCG tablet  Insomnia, unspecified type - Plan: zolpidem (AMBIEN) 5 MG tablet  Following up today Her INR is low- adjusted coumadin up as per pt instructions Start amlodipine 2.5 mg for her elevated BP She will come in for a nurse visit in one week to check her INR and BP  Lab Results  Component Value Date   INR 1.8 (A) 12/20/2017   INR 2.78 12/09/2017   INR 3.0 11/30/2017   PROTIME 32.4 (H) 02/14/2011   PROTIME 36.0 (H) 07/26/2010   Otherwise await her labs for follow-up plans   Signed Lamar Blinks, MD  Called pt to clarify our coumadin dose as there was a typo on her pt instructions (now corrected) Also need to decrease her thyroid replacement dose as TSH is low Decrease from 150 to 125, recheck in one month    Results for orders placed or performed in visit on 12/20/17  TSH  Result Value Ref Range   TSH 0.04 (L) 0.35 - 4.50 uIU/mL  Basic metabolic panel  Result Value Ref Range   Sodium 141 135 - 145 mEq/L   Potassium 3.8 3.5 - 5.1 mEq/L   Chloride 103 96 - 112 mEq/L   CO2 30 19 - 32 mEq/L   Glucose, Bld 113 (H) 70 - 99 mg/dL   BUN 11 6 - 23 mg/dL   Creatinine, Ser 0.70 0.40 - 1.20 mg/dL   Calcium 9.5 8.4 - 10.5 mg/dL   GFR 86.75 >60.00 mL/min  CBC  Result Value Ref Range   WBC 7.0 4.0 - 10.5 K/uL   RBC 5.21 (H) 3.87 - 5.11 Mil/uL   Platelets 284.0 150.0 - 400.0 K/uL   Hemoglobin 14.9 12.0 - 15.0 g/dL   HCT 45.1 36.0 - 46.0 %   MCV 86.7 78.0 - 100.0 fl   MCHC 33.1 30.0 - 36.0 g/dL   RDW 13.4 11.5 - 15.5 %  POCT INR  Result Value Ref Range   INR 1.8 (A)  2.0 - 3.0

## 2017-12-20 ENCOUNTER — Encounter: Payer: Self-pay | Admitting: Family Medicine

## 2017-12-20 ENCOUNTER — Ambulatory Visit (INDEPENDENT_AMBULATORY_CARE_PROVIDER_SITE_OTHER): Payer: Medicare Other | Admitting: Family Medicine

## 2017-12-20 VITALS — BP 144/80 | HR 95 | Temp 97.7°F | Ht 65.5 in | Wt 197.4 lb

## 2017-12-20 DIAGNOSIS — G20 Parkinson's disease: Secondary | ICD-10-CM

## 2017-12-20 DIAGNOSIS — E039 Hypothyroidism, unspecified: Secondary | ICD-10-CM

## 2017-12-20 DIAGNOSIS — Z5181 Encounter for therapeutic drug level monitoring: Secondary | ICD-10-CM

## 2017-12-20 DIAGNOSIS — I10 Essential (primary) hypertension: Secondary | ICD-10-CM | POA: Diagnosis not present

## 2017-12-20 DIAGNOSIS — D6859 Other primary thrombophilia: Secondary | ICD-10-CM

## 2017-12-20 DIAGNOSIS — Z7901 Long term (current) use of anticoagulants: Secondary | ICD-10-CM

## 2017-12-20 DIAGNOSIS — G4700 Insomnia, unspecified: Secondary | ICD-10-CM | POA: Diagnosis not present

## 2017-12-20 DIAGNOSIS — G20A1 Parkinson's disease without dyskinesia, without mention of fluctuations: Secondary | ICD-10-CM

## 2017-12-20 LAB — TSH: TSH: 0.04 u[IU]/mL — AB (ref 0.35–4.50)

## 2017-12-20 LAB — CBC
HCT: 45.1 % (ref 36.0–46.0)
HEMOGLOBIN: 14.9 g/dL (ref 12.0–15.0)
MCHC: 33.1 g/dL (ref 30.0–36.0)
MCV: 86.7 fl (ref 78.0–100.0)
PLATELETS: 284 10*3/uL (ref 150.0–400.0)
RBC: 5.21 Mil/uL — AB (ref 3.87–5.11)
RDW: 13.4 % (ref 11.5–15.5)
WBC: 7 10*3/uL (ref 4.0–10.5)

## 2017-12-20 LAB — BASIC METABOLIC PANEL
BUN: 11 mg/dL (ref 6–23)
CALCIUM: 9.5 mg/dL (ref 8.4–10.5)
CHLORIDE: 103 meq/L (ref 96–112)
CO2: 30 meq/L (ref 19–32)
Creatinine, Ser: 0.7 mg/dL (ref 0.40–1.20)
GFR: 86.75 mL/min (ref >60.00)
Glucose, Bld: 113 mg/dL — ABNORMAL HIGH (ref 70–99)
POTASSIUM: 3.8 meq/L (ref 3.5–5.1)
SODIUM: 141 meq/L (ref 135–145)

## 2017-12-20 LAB — POCT INR: INR: 1.8 — AB (ref 2.0–3.0)

## 2017-12-20 MED ORDER — LEVOTHYROXINE SODIUM 125 MCG PO TABS: 125.0000 ug | ORAL_TABLET | Freq: Every day | ORAL | 3 refills | Status: DC

## 2017-12-20 MED ORDER — AMLODIPINE BESYLATE 2.5 MG PO TABS: 2.5000 mg | ORAL_TABLET | Freq: Every day | ORAL | 6 refills | Status: DC

## 2017-12-20 MED ORDER — ZOLPIDEM TARTRATE 5 MG PO TABS: 5.0000 mg | ORAL_TABLET | Freq: Every evening | ORAL | 2 refills | Status: DC | PRN

## 2017-12-20 NOTE — Patient Instructions (Addendum)
It was good to see you today!   Your INR is a bit low Please adjust your coumadin as follows: Take 6 mg 4 days a week 4 mg 3 days a week This will be a total weekly dose of 36 mg  For your blood pressure we will add amlodipine 2.5 mg per day   Please come back in one week for an INR and BP check I will also check your thyroid, other routine labs today

## 2017-12-21 ENCOUNTER — Telehealth: Payer: Self-pay

## 2017-12-21 NOTE — Telephone Encounter (Signed)
Copied from [place] [id number]. Topic: Quick Communication - See Telephone Encounter >> Dec 21, 2017  9:32 AM Hewitt Shorts wrote: [name] [name] is calling to get a new rx on the ambien rx -because the rx that was received says to fill as brand name but the pharmacy called the patient and she wants to have the generic   Best number to pharmacy is [phone]

## 2017-12-21 NOTE — Telephone Encounter (Signed)
Generic is fine, called pharmacy

## 2017-12-25 ENCOUNTER — Telehealth: Payer: Self-pay | Admitting: *Deleted

## 2017-12-25 DIAGNOSIS — M545 Low back pain: Secondary | ICD-10-CM | POA: Diagnosis not present

## 2017-12-25 DIAGNOSIS — M6281 Muscle weakness (generalized): Secondary | ICD-10-CM | POA: Diagnosis not present

## 2017-12-25 DIAGNOSIS — M25512 Pain in left shoulder: Secondary | ICD-10-CM | POA: Diagnosis not present

## 2017-12-25 DIAGNOSIS — R260 Ataxic gait: Secondary | ICD-10-CM | POA: Diagnosis not present

## 2017-12-25 DIAGNOSIS — M25511 Pain in right shoulder: Secondary | ICD-10-CM | POA: Diagnosis not present

## 2017-12-25 DIAGNOSIS — M542 Cervicalgia: Secondary | ICD-10-CM | POA: Diagnosis not present

## 2017-12-25 NOTE — Telephone Encounter (Signed)
Received Cologuard Order Cancellation: [id number]; order has been changed to Suspended for Inactivity, the order will be reactivated if patient returns their sample w/i 365 days of the Initial order. [employer] has made several attempts to reach patient by phone and letter unsuccessfully; forwarded to provider/SLS 10/22

## 2017-12-27 ENCOUNTER — Ambulatory Visit (INDEPENDENT_AMBULATORY_CARE_PROVIDER_SITE_OTHER): Payer: Medicare Other

## 2017-12-27 VITALS — BP 141/85 | HR 92

## 2017-12-27 DIAGNOSIS — Z7901 Long term (current) use of anticoagulants: Secondary | ICD-10-CM | POA: Diagnosis not present

## 2017-12-27 LAB — POCT INR: INR: 2.7 (ref 2.0–3.0)

## 2017-12-27 NOTE — Progress Notes (Signed)
Pre visit review using our clinic tool,if applicable. No additional management support is needed unless otherwise documented below in the visit note.   Pt here for INR check per order from Dr. Janett Billow [name]  Goal INR = 2.0-3.0  Last INR = 1.8  Pt currently takes Coumadin 6 mg 4 days and 4 mg 3 days  Pt denies recent antibiotics, no dietary changes and no unusual bruising / bleeding.  INR today = 2.7  BP today =141/85 P=92  Patient states she has not taken BP medication this morning.  Pt advised per Dr. Lorelei Pont to continue taking Coumadin as ordered and return for INR  Check in 3 weeks. Appointment scheduled.

## 2017-12-27 NOTE — Progress Notes (Signed)
I have reviewed documentation and agree

## 2017-12-28 ENCOUNTER — Ambulatory Visit (INDEPENDENT_AMBULATORY_CARE_PROVIDER_SITE_OTHER): Payer: Medicare Other | Admitting: Neurology

## 2017-12-28 DIAGNOSIS — G243 Spasmodic torticollis: Secondary | ICD-10-CM

## 2017-12-28 MED ORDER — ONABOTULINUMTOXINA 100 UNITS IJ SOLR
150.0000 [IU] | Freq: Once | INTRAMUSCULAR | Status: AC
  Administered 2017-12-28: 150 [IU] via INTRAMUSCULAR

## 2017-12-28 NOTE — Procedures (Signed)
[facility]   Procedure Note Botox  Attending: Dr. Wells Guiles Brodric Schauer  Preoperative Diagnosis(es): Cervical Dystonia  Result History  Onset of effect: n/a  Duration of Benefit: n/a  Adverse Effects: n/a   Consent obtained from: The patient Benefits discussed included, but were not limited to decreased muscle tightness, increased joint range of motion, and decreased pain.  Risk discussed included, but were not limited pain and discomfort, bleeding, bruising, excessive weakness, venous thrombosis, muscle atrophy and dysphagia.  A copy of the patient medication guide was given to the patient which explains the blackbox warning.  Patients identity and treatment sites confirmed Yes.  .  Details of Procedure: Skin was cleaned with alcohol.  A 30 gauge, 33mm  needle was introduced to the target muscle, except for posterior splenius where 27 gauge, 1.5 inch needle used.   Prior to injection, the needle plunger was aspirated to make sure the needle was not within a blood vessel.  There was no blood retrieved on aspiration.    Following is a summary of the muscles injected  And the amount of Botulinum toxin used:   Dilution 0.9% preservative free saline mixed with 100 u Botox type A to make 10 U per 0.1cc  Injections  Location Left  Right Units Number of sites        Sternocleidomastoid  40 40 1  Splenius Capitus, posterior approach 70  70 1  Splenius Capitus, lateral approach 40  40 1  Levator Scapulae      Trapezius            TOTAL UNITS:   150    Agent: Botulinum Type A ( Onobotulinum Toxin type A ).  1 vials of Botox were used, each containing 100 units and freshly diluted with 1 mL of sterile, non-preserved saline   Total injected (Units): 150  Total wasted (Units): none wasted   Pt tolerated procedure well without complications.   Reinjection is anticipated in 3 months.

## 2018-01-15 ENCOUNTER — Ambulatory Visit: Payer: Medicare Other | Admitting: Endocrinology

## 2018-01-17 ENCOUNTER — Ambulatory Visit (INDEPENDENT_AMBULATORY_CARE_PROVIDER_SITE_OTHER): Payer: Medicare Other

## 2018-01-17 DIAGNOSIS — Z7901 Long term (current) use of anticoagulants: Secondary | ICD-10-CM

## 2018-01-17 LAB — POCT INR: INR: 4.3 — AB (ref 2.0–3.0)

## 2018-01-17 NOTE — Patient Instructions (Signed)
Patient to  Hold next dose of Coumadin then take Coumadin 4 mg 5 days per week and 6 mg 2 days per week. Return in 1 week for INR check

## 2018-01-17 NOTE — Progress Notes (Signed)
Pre visit review using our clinic tool,if applicable. No additional management support is needed unless otherwise documented below in the visit note.   Pt here for INR check per order from Dre. J. [name].  Goal INR = 2.0-3.0  Last INR = 2.7  Pt currently takes Coumadin 6 mg 4 days and 4 mg 3 days  Pt  states no dietary changes and no unusual bruising / bleeding.  INR today = 4.3  Pt advised per Dr. Lorelei Pont patient to hold 1 dose (tomorrow) then take 4 mg 5 days per week except 6 mg  2 days per week.  and return for INR check in 1 week.

## 2018-01-25 ENCOUNTER — Ambulatory Visit (INDEPENDENT_AMBULATORY_CARE_PROVIDER_SITE_OTHER): Payer: Medicare Other

## 2018-01-25 DIAGNOSIS — Z7901 Long term (current) use of anticoagulants: Secondary | ICD-10-CM | POA: Diagnosis not present

## 2018-01-25 LAB — POCT INR: INR: 3.4 — AB (ref 2.0–3.0)

## 2018-01-25 NOTE — Progress Notes (Addendum)
Pre visit review using our clinic tool,if applicable. No additional management support is needed unless otherwise documented below in the visit note.   Pt here for INR check per Lamar Blinks, MD  Goal INR = 2.0-3.0 Last INR = 4.3  Pt currently takes Coumadin 4 mg 5 days per week and 6 mg 2 days per week.  Pt denies recent antibiotics, no dietary changes and no unusual bruising / bleeding.  INR today = 3.4  Pt advised per Mackie Pai PA-C-DOD Take Coumadin 4 mg today 2 mg on Saturday and Sundy the resume previous schedule on Monday.  Return for INR recheck on next Friday.   This is the advise after confirming with patient that her INR was 3.4 today.   Mackie Pai, PA-C

## 2018-02-04 ENCOUNTER — Ambulatory Visit: Payer: Medicare Other

## 2018-02-07 ENCOUNTER — Ambulatory Visit (INDEPENDENT_AMBULATORY_CARE_PROVIDER_SITE_OTHER): Payer: Medicare Other

## 2018-02-07 DIAGNOSIS — Z7901 Long term (current) use of anticoagulants: Secondary | ICD-10-CM

## 2018-02-07 LAB — POCT INR: INR: 4.1 — AB (ref 2.0–3.0)

## 2018-02-07 NOTE — Progress Notes (Signed)
I confirmed with karen that pt was also told to hold one dose of coumadin as per my instructions

## 2018-02-07 NOTE — Progress Notes (Addendum)
Pre visit review using our clinic tool,if applicable. No additional management support is needed unless otherwise documented below in the visit note.   Pt here for INR check per order from E. Saguier dated 01/25/18  Goal INR = 2.0-3.0  Last INR =3.4  Pt currently takes Coumadin Coumadin 4 mg 5 days per week and 6 mg 2 days per week.  Pt denies recent antibiotics, no dietary changes and no unusual bruising / bleeding.  INR today = 4.1  Pt advised per Dr. Lorelei Pont to hold today's dose of Coumadin  then take Coumadin 4 mg for 6 days then 6 mg 1 day and return for INR in 1 week.  Patient agreed appointment scheduled.

## 2018-02-11 ENCOUNTER — Other Ambulatory Visit: Payer: Self-pay

## 2018-02-11 ENCOUNTER — Emergency Department (HOSPITAL_BASED_OUTPATIENT_CLINIC_OR_DEPARTMENT_OTHER)
Admission: EM | Admit: 2018-02-11 | Discharge: 2018-02-12 | Disposition: A | Discharge status: Home / Self Care | Payer: Medicare Other | Attending: Emergency Medicine | Admitting: Emergency Medicine

## 2018-02-11 ENCOUNTER — Encounter (HOSPITAL_BASED_OUTPATIENT_CLINIC_OR_DEPARTMENT_OTHER): Payer: Self-pay | Admitting: Emergency Medicine

## 2018-02-11 ENCOUNTER — Emergency Department (HOSPITAL_BASED_OUTPATIENT_CLINIC_OR_DEPARTMENT_OTHER): Payer: Medicare Other

## 2018-02-11 ENCOUNTER — Ambulatory Visit: Payer: Medicare Other

## 2018-02-11 DIAGNOSIS — E039 Hypothyroidism, unspecified: Secondary | ICD-10-CM | POA: Insufficient documentation

## 2018-02-11 DIAGNOSIS — M069 Rheumatoid arthritis, unspecified: Secondary | ICD-10-CM | POA: Diagnosis not present

## 2018-02-11 DIAGNOSIS — I10 Essential (primary) hypertension: Secondary | ICD-10-CM | POA: Insufficient documentation

## 2018-02-11 DIAGNOSIS — R410 Disorientation, unspecified: Secondary | ICD-10-CM | POA: Insufficient documentation

## 2018-02-11 DIAGNOSIS — Z7901 Long term (current) use of anticoagulants: Secondary | ICD-10-CM | POA: Insufficient documentation

## 2018-02-11 DIAGNOSIS — Z87891 Personal history of nicotine dependence: Secondary | ICD-10-CM | POA: Insufficient documentation

## 2018-02-11 DIAGNOSIS — E860 Dehydration: Secondary | ICD-10-CM | POA: Diagnosis not present

## 2018-02-11 DIAGNOSIS — Z96651 Presence of right artificial knee joint: Secondary | ICD-10-CM | POA: Diagnosis not present

## 2018-02-11 DIAGNOSIS — Z79899 Other long term (current) drug therapy: Secondary | ICD-10-CM | POA: Insufficient documentation

## 2018-02-11 DIAGNOSIS — R462 Strange and inexplicable behavior: Secondary | ICD-10-CM | POA: Diagnosis present

## 2018-02-11 LAB — URINALYSIS, ROUTINE W REFLEX MICROSCOPIC
GLUCOSE, UA: NEGATIVE mg/dL
Hgb urine dipstick: NEGATIVE
Ketones, ur: NEGATIVE mg/dL
LEUKOCYTES UA: NEGATIVE
Nitrite: NEGATIVE
PH: 6 (ref 5.0–8.0)
Protein, ur: NEGATIVE mg/dL
SPECIFIC GRAVITY, URINE: 1.025 (ref 1.005–1.030)

## 2018-02-11 LAB — COMPREHENSIVE METABOLIC PANEL
ALBUMIN: 4.1 g/dL (ref 3.5–5.0)
ALT: 22 U/L (ref 0–44)
AST: 26 U/L (ref 15–41)
Alkaline Phosphatase: 107 U/L (ref 38–126)
Anion gap: 9 (ref 5–15)
BUN: 11 mg/dL (ref 8–23)
CO2: 26 mmol/L (ref 22–32)
CREATININE: 0.6 mg/dL (ref 0.44–1.00)
Calcium: 9.5 mg/dL (ref 8.9–10.3)
Chloride: 104 mmol/L (ref 98–111)
GFR calc Af Amer: >60 mL/min (ref >60)
GFR calc non Af Amer: >60 mL/min (ref >60)
GLUCOSE: 108 mg/dL — AB (ref 70–99)
POTASSIUM: 3.3 mmol/L — AB (ref 3.5–5.1)
Sodium: 139 mmol/L (ref 135–145)
Total Bilirubin: 0.9 mg/dL (ref 0.3–1.2)
Total Protein: 7.2 g/dL (ref 6.5–8.1)

## 2018-02-11 LAB — CBC WITH DIFFERENTIAL/PLATELET
ABS IMMATURE GRANULOCYTES: 0.04 10*3/uL (ref 0.00–0.07)
BASOS ABS: 0.1 10*3/uL (ref 0.0–0.1)
Basophils Relative: 1 %
EOS ABS: 0.1 10*3/uL (ref 0.0–0.5)
Eosinophils Relative: 1 %
HCT: 44 % (ref 36.0–46.0)
HEMOGLOBIN: 13.7 g/dL (ref 12.0–15.0)
IMMATURE GRANULOCYTES: 1 %
LYMPHS ABS: 2.2 10*3/uL (ref 0.7–4.0)
LYMPHS PCT: 26 %
MCH: 27.8 pg (ref 26.0–34.0)
MCHC: 31.1 g/dL (ref 30.0–36.0)
MCV: 89.2 fL (ref 80.0–100.0)
Monocytes Absolute: 0.7 10*3/uL (ref 0.1–1.0)
Monocytes Relative: 9 %
NEUTROS ABS: 5.2 10*3/uL (ref 1.7–7.7)
NEUTROS PCT: 62 %
NRBC: 0 % (ref 0.0–0.2)
Platelets: 359 10*3/uL (ref 150–400)
RBC: 4.93 MIL/uL (ref 3.87–5.11)
RDW: 14 % (ref 11.5–15.5)
WBC: 8.3 10*3/uL (ref 4.0–10.5)

## 2018-02-11 LAB — TROPONIN I: Troponin I: <0.03 ng/mL (ref <0.03)

## 2018-02-11 LAB — PROTIME-INR
INR: 1.91
Prothrombin Time: 21.7 seconds — ABNORMAL HIGH (ref 11.4–15.2)

## 2018-02-11 MED ORDER — SODIUM CHLORIDE 0.9 % IV BOLUS
1000.0000 mL | Freq: Once | INTRAVENOUS | Status: AC
  Administered 2018-02-11: 1000 mL via INTRAVENOUS

## 2018-02-11 NOTE — Discharge Instructions (Signed)
Avoid using the CBD cream in case that may be causing some of your symptoms.  Make sure you were drinking plenty of fluids every day.  If you develop fever, vomiting or worsening confusion return.

## 2018-02-11 NOTE — ED Provider Notes (Signed)
[facility] EMERGENCY DEPARTMENT Provider Note   CSN: [id number] Arrival date & time: 02/11/18  1834     History   Chief Complaint Chief Complaint  Patient presents with  . Dysuria    HPI [name] [name] is a 72 y.o. female.  Patient is a 72 year old female with a history of Parkinson's disease, peripheral vascular disease, prior PE on Coumadin, thyroid disease, hypertension who is presenting today with her family for 24 hours of unusual behavior.  Daughter states yesterday she seemed normal but today patient has not been wanting to eat has had some nausea and had complained of some urinary frequency.  She denies any abdominal pain, chest pain, shortness of breath.  She does admit to having falls often because of her balance issues last fall was approximately 1 week ago.  She does continue to take Coumadin and states has a hard time controlling her INR.  Last INR was supratherapeutic.  She has not recently been on any antibiotics but states she gets urinary tract infections frequently and they were concerned that that might be what is happening.  Daughter states today when she was talking to her she was pressing the buttons on her phone and the daughter asked her why and she said it was because she was trying to get a hold of her.  Even though they were in the same room.  Patient denies any hallucinations.  She has not had any leg pain or swelling.  She does admit to starting a new CBD cream but no other medications.  The history is provided by the patient and a relative.    Past Medical History:  Diagnosis Date  . Arthritis   . Chronic insomnia   . Complication of anesthesia    severe itching night of surgery requiring meds- also couldnt swallow- throat "paralyzed""  . Fibromyalgia   . Glaucoma   . Hypercholesterolemia   . Hyperlipidemia   . Hypertension    PCP Dr Cari Caraway- [place]  05/15/11 with clearance and note on chart,    chest x ray, EKG 12/12 [facility], eccho 7/11 [facility]    . Hypothyroidism    s/p graves disease  . Kidney stone   . PD (Parkinson's disease) ([place])   . Peripheral vascular disease ([place])    PULMONARY EMBOLUS x 2- 2011, 2012/ FOLLOWED BY DR [id number] 12/12 [facility]   PT STAES WILL STOP COUMADIN 3/12 and begin LOVONOX 05/17/11 as previously instructed  . Prediabetes   . Sinus infection    at present- dr Honor Loh PA aware- was seen there and at PCP 05/10/11  . Thyroid disease    Hypothyroidism    Patient Active Problem List   Diagnosis Date Noted  . Osteopenia 12/18/2017  . Glaucoma 10/13/2017  . Transient cerebral ischemic attack 10/13/2017  . History of prediabetes 10/13/2017  . Sepsis secondary to UTI ([place]) 02/12/2017  . AKI (acute kidney injury) ([place]) 02/12/2017  . Recurrent UTI 11/23/2016  . PD (Parkinson's disease) ([place]) 11/23/2016  . Fibromyalgia 11/23/2016  . Hypercholesterolemia 11/23/2016  . Hypertension 11/23/2016  . Hypothyroidism, adult 11/23/2016  . Pulmonary HTN ([place]) 11/23/2016  . Left ventricular diastolic dysfunction 84/66/5993  . History of pulmonary embolus (PE) 2/2 hypercoagulable state 11/23/2016  . Sepsis ([place]) 09/17/2016  . SIRS (systemic inflammatory response syndrome) ([place]) 04/29/2016  . Hyperlipidemia 04/29/2016  . Hypothyroidism 04/29/2016  . Hematuria 04/05/2016  . Benign essential HTN   . Displaced fracture of fifth cervical vertebra ([place]) 12/17/2015  . Gait  disturbance 12/17/2015  . Trochanteric bursitis of both hips 09/13/2015  . Chronic low back pain 06/07/2015  . Thyroid disease 05/17/2015  . Parkinson's disease ([place]) 05/17/2015  . S/P right TKA 05/22/2011    Past Surgical History:  Procedure Laterality Date  . ABDOMINAL HYSTERECTOMY    . APPENDECTOMY    . CATARACT EXTRACTION Bilateral   . CHOLECYSTECTOMY    . COLONOSCOPY    . EXPLORATORY LAPAROTOMY    . JOINT REPLACEMENT     left knee  7/12  . TOTAL KNEE ARTHROPLASTY  05/22/2011   Procedure: TOTAL KNEE ARTHROPLASTY;  Surgeon: Mauri Pole, MD;   Location: [facility] ORS;  Service: Orthopedics;  Laterality: Right;     OB History   None      Home Medications    Prior to Admission medications   Medication Sig Start Date End Date Taking? Authorizing Provider  acetaminophen (TYLENOL) 500 MG tablet Take 500 mg by mouth every 6 (six) hours as needed for mild pain.    [provider]  amLODipine (NORVASC) 2.5 MG tablet Take 1 tablet (2.5 mg total) by mouth daily. 12/20/17   [name], [name], MD  atorvastatin (LIPITOR) 10 MG tablet Take 1 tablet (10 mg total) by mouth daily. 11/01/17   [name], [name], MD  carbidopa-levodopa (SINEMET IR) 25-100 MG tablet 2 in the morning, 1 in the afternoon, 1 in the evening Patient taking differently: Take 1-2 tablets by mouth See admin instructions. Take 2 tablets in the morning then take 1 tablet  in the afternoon and take 1 tablet  in the evening 10/09/17   Tat, Rebecca S, DO  cephALEXin (KEFLEX) 250 MG capsule Take 250 mg by mouth daily. 11/28/17   [provider]  Cholecalciferol (VITAMIN D3) 5000 UNITS CAPS Take 5,000 Units by mouth daily.     [provider]  cyanocobalamin 500 MCG tablet Take 500 mcg by mouth daily.    [provider]  ketoconazole (NIZORAL) 2 % shampoo Apply 1 application topically 2 (two) times a week. 04/23/17   [name], [name], MD  levothyroxine (SYNTHROID, LEVOTHROID) 125 MCG tablet Take 1 tablet (125 mcg total) by mouth daily. 12/20/17   [name], [name], MD  magnesium oxide (MAG-OX) 400 (241.3 Mg) MG tablet Take 0.5 tablets (200 mg total) by mouth daily. 12/21/15   Robbie Lis, MD  Melatonin 5 MG TABS Take 1 tablet by mouth at bedtime as needed (for sleep).    [provider]  Menthol, Topical Analgesic, (BIOFREEZE EX) Apply 1 application topically daily as needed (for pain).    [provider]  nicotine polacrilex (NICORETTE) 2 MG gum Take 2 mg by mouth as needed for smoking cessation.    [provider]   ondansetron (ZOFRAN-ODT) 4 MG disintegrating tablet DISSOLVE 1 TABLET IN MOUTH EVERY 8 HOURS AS NEEDED FOR NAUSEA AND VOMITING 10/24/17   [name], [name], MD  venlafaxine XR (EFFEXOR-XR) 150 MG 24 hr capsule Take 1 capsule (150 mg total) by mouth daily with breakfast. 11/01/17   [name], [name], MD  warfarin (COUMADIN) 4 MG tablet USE AS DIRECTED BY MD 11/08/17   [name], [name], MD  warfarin (COUMADIN) 4 MG tablet Take 1 tablet (4 mg total) by mouth daily. Adjust as directed by MD 11/12/17   [name], [name], MD  zolpidem (AMBIEN) 5 MG tablet Take 1 tablet (5 mg total) by mouth at bedtime as needed for sleep. Pt requests brand only. Do not  combine with any other sedating substance 12/20/17   [name], [name], MD    Family History Family History  Problem Relation Age of Onset  . Prostate cancer Father   . Stroke Father     Social History Social History   Tobacco Use  . Smoking status: Former Smoker    Packs/day: 1.00    Last attempt to quit: 05/10/1989    Years since quitting: 28.7  . Smokeless tobacco: Never Used  Substance Use Topics  . Alcohol use: Yes    Alcohol/week: 0.0 standard drinks    Comment: one drink once a month  . Drug use: No     Allergies   Crestor [rosuvastatin calcium]; Erythromycin; Gabapentin; Pregabalin; Pregabalin; Macrobid [nitrofurantoin]; and Losartan   Review of Systems Review of Systems  All other systems reviewed and are negative.    Physical Exam Updated Vital Signs BP (!) 143/75   Pulse 69   Temp 97.9 F (36.6 C) (Oral)   Resp 16   Ht 5' 6.5" (1.689 m)   Wt 84.4 kg   SpO2 95%   BMI 29.57 kg/m   Physical Exam  Constitutional: She is oriented to person, place, and time. She appears well-developed and well-nourished. No distress.  HENT:  Head: Normocephalic and atraumatic.  Mouth/Throat: Oropharynx is clear and moist.  Dry mucous membranes  Eyes: Pupils are equal, round, and reactive to light. Conjunctivae and EOM are  normal.  Pupils are 3 mm and reactive bilaterally  Neck: Normal range of motion. Neck supple.  Cardiovascular: Normal rate, regular rhythm and intact distal pulses.  No murmur heard. Pulmonary/Chest: Effort normal and breath sounds normal. No respiratory distress. She has no wheezes. She has no rales.  Abdominal: Soft. She exhibits no distension. There is no tenderness. There is no rebound and no guarding.  Musculoskeletal: Normal range of motion. She exhibits no edema or tenderness.  Neurological: She is alert and oriented to person, place, and time. She has normal strength. No sensory deficit.  Intention tremor noted.  Shuffling gait.  Skin: Skin is warm and dry. No rash noted. No erythema.  Psychiatric: She has a normal mood and affect. Her behavior is normal.  Nursing note and vitals reviewed.    ED Treatments / Results  Labs (all labs ordered are listed, but only abnormal results are displayed) Labs Reviewed  URINALYSIS, ROUTINE W REFLEX MICROSCOPIC - Abnormal; Notable for the following components:      Result Value   APPearance CLOUDY (*)    Bilirubin Urine SMALL (*)    All other components within normal limits  COMPREHENSIVE METABOLIC PANEL - Abnormal; Notable for the following components:   Potassium 3.3 (*)    Glucose, Bld 108 (*)    All other components within normal limits  PROTIME-INR - Abnormal; Notable for the following components:   Prothrombin Time 21.7 (*)    All other components within normal limits  URINE CULTURE  CBC WITH DIFFERENTIAL/PLATELET  TROPONIN I    EKG EKG Interpretation  Date/Time:  Monday February 11 2018 21:52:13 EST Ventricular Rate:  76 PR Interval:    QRS Duration: 87 QT Interval:  384 QTC Calculation: 432 R Axis:   58 Text Interpretation:  Sinus rhythm Abnormal R-wave progression, early transition No significant change since last tracing Confirmed by Blanchie Dessert [phone]) on 02/11/2018 10:49:03 PM   Radiology Dg Chest 2  View  Result Date: 02/11/2018 CLINICAL DATA:  Confusion EXAM: CHEST - 2 VIEW COMPARISON:  02/11/2017 FINDINGS:  The heart size and mediastinal contours are within normal limits. Both lungs are clear. The visualized skeletal structures are unremarkable. IMPRESSION: No active cardiopulmonary disease. Electronically Signed   By: Donavan Foil M.D.   On: 02/11/2018 22:17   Ct Head Wo Contrast  Result Date: 02/11/2018 CLINICAL DATA:  Confusion, vision changes. History of hypertension, hyperlipidemia and Parkinson's. EXAM: CT HEAD WITHOUT CONTRAST TECHNIQUE: Contiguous axial images were obtained from the base of the skull through the vertex without intravenous contrast. COMPARISON:  CT HEAD October 13, 2017 FINDINGS: BRAIN: No intraparenchymal hemorrhage, mass effect nor midline shift. Moderate to severe ventriculomegaly, cavum septum pellucidum. Sulcal effacement at the convexities with narrowed callosum angle. Patchy supratentorial white matter hypodensities. No acute large vascular territory infarcts. No abnormal extra-axial fluid collections. Basal cisterns are patent. VASCULAR: Mild calcific atherosclerosis of the carotid siphons. SKULL: No skull fracture. Severe LEFT temporomandibular osteoarthrosis. No significant scalp soft tissue swelling. SINUSES/ORBITS: Trace paranasal sinus mucosal thickening. Mastoid air cells are well aerated.The included ocular globes and orbital contents are non-suspicious. Status post bilateral ocular lens implants. OTHER: None. IMPRESSION: 1. No acute intracranial process. 2. Stable ventriculomegaly with image findings of chronic communicating hydrocephalus. 3. Moderate chronic small vessel ischemic changes. Electronically Signed   By: Elon Alas M.D.   On: 02/11/2018 22:18    Procedures Procedures (including critical care time)  Medications Ordered in ED Medications  sodium chloride 0.9 % bolus 1,000 mL (1,000 mLs Intravenous New Bag/Given 02/11/18 2224)      Initial Impression / Assessment and Plan / ED Course  I have reviewed the triage vital signs and the nursing notes.  Pertinent labs & imaging results that were available during my care of the patient were reviewed by me and considered in my medical decision making (see chart for details).     Early female presenting today with symptoms consistent with some mild delirium and possible dehydration as she has not been eating or drinking today because of some nausea.  Family was concerned she may have a urinary tract infection due to a prior history of similar symptoms.  Today patient has no evidence of UTI.  She is awake and alert on exam and is neurovascularly intact at this time.  Patient does have a shuffling gait and an intention tremor consistent with Parkinson's disease.  CT is negative for any type of acute bleed due to falls and being on Coumadin.  Coumadin today is subtherapeutic at 1.9.  Labs show no evidence of acute coronary pathology.  Unchanged EKG and normal troponin.  Renal function within normal limits and normal electrolytes.  No evidence of anemia.  Test x-ray is also within normal limits.  Patient feels improved after IV fluids.  Recommended that she hold off from using CBD cream in case that could be causing some of her symptoms.  Patient on head CT does show chronic communicating hydrocephalus however this is unchanged and she has follow-up with her neurologist this week.  Final Clinical Impressions(s) / ED Diagnoses   Final diagnoses:  Delirium  Dehydration    ED Discharge Orders    None       Blanchie Dessert, MD 02/11/18 2355

## 2018-02-11 NOTE — ED Notes (Signed)
Patient transported to CT 

## 2018-02-11 NOTE — ED Triage Notes (Signed)
Pt reports dysuria and urinary frequency with low abd pain that started today.  Sts she has UTIs "all the time."

## 2018-02-12 ENCOUNTER — Ambulatory Visit: Payer: Medicare Other

## 2018-02-12 NOTE — Progress Notes (Signed)
[name] [name] was seen today in the movement disorders clinic for neurologic consultation at the request of [name], [name], MD.   This patient is accompanied in the office by her child who supplements the history.   Prior records made available to me were reviewed.  The patient first saw Dr. [name] in March, 2016 at which point she was complaining about symptoms of diplopia and weakness.  Because of the diplopia she had acetylcholine receptor antibodies that were performed that were negative.  She has had an MRI and MRA of the brain with and without gadolinium.  I reviewed these images.  These were done on 05/21/2014.  This just revealed mild small vessel disease.  She also had a carotid ultrasound with him in March, 2016 revealed less than 50% stenosis bilaterally.  She followed up with him on 07/23/2014 and started on carbidopa/levodopa 25/100, one tablet 3 times per day (8am, 12pm, 5pm, before the meals).  She called back to him and complained about diffuse psain, headaches, depression and nausea.  Although he was not completely convinced that some of these things were not associated with her fibromyalgia and situational depression (her grandson was leaving for college and he serves as a caregiver), it was decided to taper off of her levodopa on 09/16/2014.   She did this and is now off the medication.  She states today that she initially felt great on the medication but then felt that it caused side effects.  Now that she is off of it, however, she c/o difficulty with walking. They also discussed referrals to pain management and psychiatry.  She presents today for a second opinion.  Pt states that diplopia was her first sx and then she began to notice falling.  This started in 04/2013.  The diplopia was horizontal in nature and she initially thought that it was due to lyrica.  The lyrica was d/c and it initally seemed to get better but then it came back.  It gets better if she closes an eye  (doesn't know if it matters which eye she closes).  She has some degree of diplopia daily but mostly at night.  She sees Dr. [name] at [facility] and was told that nothing was wrong with the eyes.    10/29/14 update:  The patient is following up today.  Overall, she states that she is feeling well.  She really hasn't had significant diplopia since our last visit.  She had her DaT scan on 10/14/2014 and while tracer uptake was present in the bilateral attainment, it was decreased on the right putamen relative to the right caudate, which can be seen in early parkinsonian syndromes.  The patient admits that she is still very slow.  She saw a different ophthalmologist [facility] imaging since our last visit and states that the diagnosis of glaucoma was retracted and everything looked well.  She denies any falls since our last visit no hallucinations.  12/10/14 update:  The patient is accompanied by her daughter who supplements the history.  I started the patient on carbidopa/levodopa 25/100 tid last visit.  Today, she states that she thinks that the medication makes her fibromyalgia worse but admits that it could be the weather.  Admits to a fall yesterday.   She was going into the house and there is an odd shaped stair (L shaped landing) and she fell backwards.   She called 911 and she went to [facility] and she had a CT brain and she states  that it was negative.  Had her INR checked and it was 2.6.  Does state that she was vomiting on way on the way to the hospital so they kept her for a few hours.  Still nauseated some today but has been nauseated even prior to that and wonders if the medication contributes.  She did not take any carbidopa/levodopa today.   03/09/14 update:  The patient follows up today.  Last visit, she complained about multiple side effects of levodopa including the fact that she thought that it made her fibromyalgia worse.  Although I told her that I was not convinced that it caused many of these  side effects, we ultimately decided to try rytary instead.  She started with 95 mg 3 times a day and worked up to 145 mg 3 times a day.  She called me on October 20 to state that she was doing well and then called back on November 11 to state that the medication was causing dizziness and diplopia.  She wanted to decrease the medication.  We explained that sometimes the disease can cause the symptoms and so I had her hold the medication for a few days and she stated that the dizziness and diplopia resolved.  She asked again to go back down to the 95 mg 3 times a day, which we ultimately did.  She feels well going to PT.  She is not exercising otherwise.  She asks me for a referral to pain management for her fibromyalgia.  She has not had falls.  No hallucinations.    07/13/15 update:  The patient follows up today.  This patient is accompanied in the office by her daughter who supplements the history.  She is on Rytary, 95 mg 3 times per day.  She doesn't think that it wears off.  It is very expensive but she didn't qualify for the [facility] program.  She did physical therapy since our last visit.  She was referred to pain management with Dr. Letta Pate.  I reviewed his records and appreciate the input.  She was started on Cymbalta and she states that she is doing well with that and ultram.  She felt great yesterday but doesn't today and attributes that to the rain.  No hallucinations.  Still with diplopia.  Daughter asks about referral to neuro-ophth.  10/21/15 update:  The patient follows up today, unaccompanied today.  She remains on Rytary, 95 mg 3 times per day.  Last visit, she wanted a referral to neuro-ophthalmology for her diplopia.  Her appt is next month.  She has not yet had that appt that she did call me and stated that she wanted to go off of the rytary and back on the older version of levodopa because she thought that the rytary was causing double vision.  We had explained to her that this just did not  make physiologic sense because they all essentially have the same ingredient.  Therefore, she is still on the rytary. She has to cover an eye (either) to see well.   Had a fall from bathroom into bedroom.  She bruised her arms.  One other fall that was very similar.  Has been "on an off" with physical therapy.  Has a stationary bike and rides that 3 times a week in the house.    01/25/16 update:  The patient follows up today.   She is accompanied by her daughter who supplement the history.   Much has happened since our last visit  and I reviewed her records.  She saw Dr. Hassell Done on 11/22/2015.  He repeated an acetylcholine receptor antibody and obtain a CT of the orbits. He sent the patient to see Jerre Simon, orthoptist, to see if prism would be of help with regard to her ocular motility disorder.  She had a carotid ultrasound on 10/28/2015 that demonstrated 1-39% stenosis bilaterally.  She was admitted to the hospital from October 13 to October 16 and then went to inpatient rehabilitation from October 16 to October 27.  She is now in subacute rehab.  She got up in the middle of the night to use the bathroom and lost her balance and fell through the sheet rock.  She sustained a moderately displaced fracture is seen involving the posterior spinous process of C5.  Daughter thinks more confused since fall.  Having trouble getting into phone so had to take passcode off of phone. Trouble paying bills.  Goal is to go back home independently but family doesn't want that.  Family would like assisted living with monitored meds.  Daughter doesn't think that meds were being taken correctly.  CT brain has been negative for bleed.  Saw Dr. Beverly Gust in hospital for brief neuropsych exam and she recommended complete neuropsych testing in 3 months.  Still on Rytary 95 mg tid.    07/12/16 update:  Patient seen today in follow-up.  She is accompanied by her daughter who supplements the history.  I have reviewed records since our last  visit.  She was in the hospital in February with Escherichia coli sepsis secondary to UTI.  She was discharged to [facility] for rehabilitation following that.  She was not there for long and then she went back to [place] where she lived. She has subsequently moved to [place].  She moved there last month.  She lives independently.  She does own pills but daughter prepares pill box.  Meals are prepared.  She does own showers.  She just finished PT.  Daughter states only 1 fall and that was when she had that UTI and she was confused.  She got tangled in her gown and then fell.   Remains on Rytary, and is only on 95 mg 3 times per day.  She was supposed to have repeat neuropsych testing, but canceled that.  It was scheduled for 04/05/2016.  05/18/17 update: Patient is seen today in follow-up.  I have reviewed numerous records are not available to me.  Patient was admitted to the hospital in December for mental status change, which was ultimately found to be due to sepsis secondary to UTI.  She went to [facility] rehab following that. She lives at [place] and lives in independent living.  Just meals are done for her.  She is leaving there on 06/13/16.   Patient has been following up with her primary care physician.  I have reviewed those records and also communicated with her primary care physician.  The patient called [name] to let us know that Rytary was not affordable and wanted to change back to carbidopa/levodopa 25/100.  I really did not want to change back to this, as the patient reported in the past that she did not tolerate it (that it made her fibromyalgia worse).  Ultimately, we just gave her samples of Rytary, 145 mg 3 times per day (prior 95 mg 3 times per day).  No side effects.  She still wants to go back to immediate release carbidopa/levodopa.  "couple" falls getting out of the bed because  she would slip on the carpet.  She has started using socks with rubber bottoms.  Is doing PT 7 days a week and  thinks that it is helpful.  Using TENs unit on knees and that is helpful.  Had neurocognitive testing in July, 2018.  This was reviewed.  This demonstrated mild cognitive impairment and mild anxiety.  Recommendations were for her to stay in assisted living  10/09/17 update:  Pt seen in f/u for PD.  This patient is accompanied in the office by her child who supplements the history.  She wanted to change last visit from rytary back to carbidopa/levodopa 25/100 IR, 1 po tid which we did.  She was in the ED on 07/03/17 after a fall.  The records that were made available to me were reviewed.  She was going into her bathroom when it happened.  She hit her head.  CT done in the ER was negative.  She reports that she had 5 falls that week.  She reports that she is in PT at independent living.  She reports that they graduated her but then she started back.  She stopped a few weeks ago after fell and hit tailbone.  Had a recent UTI and usually notes falls with UTI.  Also noted that when she was using the Rollator falls were worse.  She is back to using the traditional walker.  In addition, the fall that happened in the bathroom was because of not having a raised toilet seat.  She has one at her home but not where she lives at [place].  Her son was supposed to bring one to her.    02/14/18 update: Patient is seen today in follow-up for parkinsonism, accompanied by her friend who supplements the history.  Her levodopa was increased last visit so that she is now taking carbidopa/levodopa 25/100, 2 tablets in the morning, 1 in the afternoon and 1 in the evening.  She received Botox for cervical dystonia on December 28, 2017.  She reports that "that stuff is ideal."  "just put me in a tub of it and I will be good."  She has had multiple falls.  She moved to a new place with wood floors and she thinks that is the cause.    Patient went to the emergency room on February 11, 2018 with confusion.  I did review those ER records.   Overall, emergency room work-up was negative.  They recommended that she hold off on the CBD cream that she was using, as felt it could be contributing to her symptoms.  They did do a CT brain which I reviewed.  Ventricles were somewhat enlarged compared to atrophy.     PREVIOUS MEDICATIONS: Sinemet; rytary  ALLERGIES:   Allergies  Allergen Reactions  . Crestor [Rosuvastatin Calcium] Other (See Comments)    Feeling very bad, aching all over.  . Erythromycin Hives  . Gabapentin Other (See Comments)    Blurred vision  . Pregabalin Other (See Comments)    Crossed vision.  . Pregabalin Other (See Comments)    Crossed vision. Unknown   . Macrobid [Nitrofurantoin] Hives  . Losartan Itching    CURRENT MEDICATIONS:  Outpatient Encounter Medications as of 02/14/2018  Medication Sig  . acetaminophen (TYLENOL) 500 MG tablet Take 500 mg by mouth every 6 (six) hours as needed for mild pain.  Marland Kitchen amLODipine (NORVASC) 2.5 MG tablet Take 1 tablet (2.5 mg total) by mouth daily.  Marland Kitchen atorvastatin (LIPITOR) 10 MG tablet Take  1 tablet (10 mg total) by mouth daily.  . carbidopa-levodopa (SINEMET IR) 25-100 MG tablet 2 in the morning, 1 in the afternoon, 1 in the evening (Patient taking differently: Take 1-2 tablets by mouth See admin instructions. Take 2 tablets in the morning then take 1 tablet  in the afternoon and take 1 tablet  in the evening)  . cephALEXin (KEFLEX) 250 MG capsule Take 250 mg by mouth daily.  . Cholecalciferol (VITAMIN D3) 5000 UNITS CAPS Take 5,000 Units by mouth daily.   . cyanocobalamin 500 MCG tablet Take 500 mcg by mouth daily.  Marland Kitchen ketoconazole (NIZORAL) 2 % shampoo Apply 1 application topically 2 (two) times a week.  . levothyroxine (SYNTHROID, LEVOTHROID) 125 MCG tablet Take 1 tablet (125 mcg total) by mouth daily.  . magnesium oxide (MAG-OX) 400 (241.3 Mg) MG tablet Take 0.5 tablets (200 mg total) by mouth daily.  . Melatonin 5 MG TABS Take 1 tablet by mouth at bedtime as  needed (for sleep).  . Menthol, Topical Analgesic, (BIOFREEZE EX) Apply 1 application topically daily as needed (for pain).  . nicotine polacrilex (NICORETTE) 2 MG gum Take 2 mg by mouth as needed for smoking cessation.  . ondansetron (ZOFRAN-ODT) 4 MG disintegrating tablet DISSOLVE 1 TABLET IN MOUTH EVERY 8 HOURS AS NEEDED FOR NAUSEA AND VOMITING  . venlafaxine XR (EFFEXOR-XR) 150 MG 24 hr capsule Take 1 capsule (150 mg total) by mouth daily with breakfast.  . warfarin (COUMADIN) 4 MG tablet USE AS DIRECTED BY MD  . warfarin (COUMADIN) 4 MG tablet Take 1 tablet (4 mg total) by mouth daily. Adjust as directed by MD  . zolpidem (AMBIEN) 5 MG tablet Take 1 tablet (5 mg total) by mouth at bedtime as needed for sleep. Pt requests brand only. Do not combine with any other sedating substance   No facility-administered encounter medications on file as of 02/14/2018.     PAST MEDICAL HISTORY:   Past Medical History:  Diagnosis Date  . Arthritis   . Chronic insomnia   . Complication of anesthesia    severe itching night of surgery requiring meds- also couldnt swallow- throat "paralyzed""  . Fibromyalgia   . Glaucoma   . Hypercholesterolemia   . Hyperlipidemia   . Hypertension    PCP Dr Cari Caraway- [facility]  05/15/11 with clearance and note on chart,    chest x ray, EKG 12/12 EP[facility], eccho 7/11 EP[facility]  . Hypothyroidism    s/p graves disease  . Kidney stone   . PD (Parkinson's disease) ([place])   . Peripheral vascular disease ([place])    PULMONARY EMBOLUS x 2- 2011, 2012/ FOLLOWED BY DR [id number] 12/12 [facility]   PT [facility] WILL STOP COUMADIN 3/12 and begin LOVONOX 05/17/11 as previously instructed  . Prediabetes   . Sinus infection    at present- dr Honor Loh PA aware- was seen there and at PCP 05/10/11  . Thyroid disease    Hypothyroidism    PAST SURGICAL HISTORY:   Past Surgical History:  Procedure Laterality Date  . ABDOMINAL HYSTERECTOMY    . APPENDECTOMY    . CATARACT EXTRACTION Bilateral   .  CHOLECYSTECTOMY    . COLONOSCOPY    . EXPLORATORY LAPAROTOMY    . JOINT REPLACEMENT     left knee  7/12  . TOTAL KNEE ARTHROPLASTY  05/22/2011   Procedure: TOTAL KNEE ARTHROPLASTY;  Surgeon: Mauri Pole, MD;  Location: [facility];  Service: Orthopedics;  Laterality: Right;    SOCIAL HISTORY:  Social History   Socioeconomic History  . Marital status: Widowed    Spouse name: Not on file  . Number of children: 2  . Years of education: Not on file  . Highest education level: Not on file  Occupational History  . Not on file  Social Needs  . Financial resource strain: Not on file  . Food insecurity:    Worry: Not on file    Inability: Not on file  . Transportation needs:    Medical: Not on file    Non-medical: Not on file  Tobacco Use  . Smoking status: Former Smoker    Packs/day: 1.00    Last attempt to quit: 05/10/1989    Years since quitting: 28.7  . Smokeless tobacco: Never Used  Substance and Sexual Activity  . Alcohol use: Yes    Alcohol/week: 0.0 standard drinks    Comment: one drink once a month  . Drug use: No  . Sexual activity: Not on file  Lifestyle  . Physical activity:    Days per week: Not on file    Minutes per session: Not on file  . Stress: Not on file  Relationships  . Social connections:    Talks on phone: Not on file    Gets together: Not on file    Attends religious service: Not on file    Active member of club or organization: Not on file    Attends meetings of clubs or organizations: Not on file    Relationship status: Not on file  . Intimate partner violence:    Fear of current or ex partner: Not on file    Emotionally abused: Not on file    Physically abused: Not on file    Forced sexual activity: Not on file  Other Topics Concern  . Not on file  Social History Narrative   Lives at home alone   Drinks caffeine occasionally     FAMILY HISTORY:   Family Status  Relation Name Status  . Mother  Deceased at age 108       peritonitis  .  Father  Deceased at age 44       stroke, prostate cancer  . Sister  Alive       10 siblings - 1 cerebral palsy  . Brother  Alive    ROS:  Review of Systems  Constitutional: Negative.   HENT: Negative.   Eyes: Negative.   Cardiovascular: Negative.   Gastrointestinal: Negative.   Genitourinary: Negative.        No daytime loss of bladder control.  Wears depends at night for some nighttime incontinence   Musculoskeletal: Positive for joint pain (knees).  Skin: Negative.      PHYSICAL EXAMINATION:    VITALS:   Vitals:   02/14/18 1103  BP: 140/74  Weight: 191 lb (86.6 kg)  Height: 5\' 6"  (1.676 m)   Wt Readings from Last 3 Encounters:  02/14/18 191 lb (86.6 kg)  02/11/18 186 lb (84.4 kg)  12/20/17 197 lb 6.4 oz (89.5 kg)     GEN:  The patient appears stated age and is in NAD. HEENT:  Normocephalic, atraumatic.  CV:  Tachycardic.  Regular rhythm.   Lungs:  CTAB.   Neck/HEME:  There is a soft right carotid bruit.   Head is flexed  Neurological examination:  Orientation:  She is alert and oriented x 3 today.  Able to provide detailed hx (daughter not here today) Montreal Cognitive Assessment  01/25/2016  Visuospatial/ Executive (0/5) 2  Naming (0/3) 3  Attention: Read list of digits (0/2) 2  Attention: Read list of letters (0/1) 0  Attention: Serial 7 subtraction starting at 100 (0/3) 2  Language: Repeat phrase (0/2) 2  Language : Fluency (0/1) 0  Abstraction (0/2) 2  Delayed Recall (0/5) 5  Orientation (0/6) 5  Total 23  Adjusted Score (based on education) 23   :  Orientation: The patient is alert and oriented x3. Cranial nerves: There is good facial symmetry. The speech is fluent and clear. Soft palate rises symmetrically and there is no tongue deviation. Hearing is intact to conversational tone. Sensation: Sensation is intact to light touch throughout Motor: Strength is 5/5 in the bilateral upper and lower extremities.   Shoulder shrug is equal and  symmetric.  There is no pronator drift.  Movement examination: Tone: There is mild increased tone in the right upper extremity. Abnormal movements: None Coordination:  There is mild decremation with RAM's, seen mostly with finger taps bilaterally. Gait and Station: The patient is using her walker today.  She is much slower than in the past but states that it is because of knee pain and the cold weather.  She is stooped.     ASSESSMENT/PLAN:  1.  Parkinsonism  -    She will take carbidopa/levodopa 25/100, 2 tablets in the morning, 1 in the afternoon and 1 in the evening.  -Explained again that current walker does not prevent falls backwards.  She knows this and have discussed it several times in the past.  She has changed back to her walker to a traditional walker from a Rollator and that has helped.  -RX for Physical therapy where she lives  -reviewed CT of the brain with her.  I don't think that clinical picture is consistent with [facility] but opted to schedule high volume LP  -safety discussed at length 2.  Fall with fx of C5 spinous process  -did inpatient rehab stay after this.  Now doing subacute rehab 3.  Fibromyalgia  -Stable and no longer seeing Dr. Letta Pate. 3.  Mild cognitive impairment  -Neuropsych testing done in July 2018 did not show evidence of dementia. 4.  cervical dystonia  -Had first Botox on December 28, 2017.  Thinks that it was very helpful. 5.  Follow up is anticipated in the next few months, sooner should new neurologic issues arise.  Much greater than 50% of this visit was spent in counseling and coordinating care.  Total face to face time:  25 min

## 2018-02-13 ENCOUNTER — Encounter: Payer: Self-pay | Admitting: Family Medicine

## 2018-02-13 LAB — URINE CULTURE

## 2018-02-14 ENCOUNTER — Ambulatory Visit (INDEPENDENT_AMBULATORY_CARE_PROVIDER_SITE_OTHER): Payer: Medicare Other | Admitting: Neurology

## 2018-02-14 ENCOUNTER — Encounter: Payer: Self-pay | Admitting: Neurology

## 2018-02-14 VITALS — BP 140/74 | Ht 66.0 in | Wt 191.0 lb

## 2018-02-14 DIAGNOSIS — G20 Parkinson's disease: Secondary | ICD-10-CM | POA: Diagnosis not present

## 2018-02-14 DIAGNOSIS — Z5181 Encounter for therapeutic drug level monitoring: Secondary | ICD-10-CM

## 2018-02-14 DIAGNOSIS — G243 Spasmodic torticollis: Secondary | ICD-10-CM | POA: Diagnosis not present

## 2018-02-14 DIAGNOSIS — G919 Hydrocephalus, unspecified: Secondary | ICD-10-CM | POA: Diagnosis not present

## 2018-02-14 NOTE — Addendum Note (Signed)
Addended byAnnamaria Helling on: 02/14/2018 03:35 PM   Modules accepted: Orders

## 2018-02-14 NOTE — Patient Instructions (Signed)
1. Lumbar puncture scheduled at [facility] on 02/21/18 at 1:00 pm. Please arrive at 9:00 am. You will be evaluated by a physical therapist first. Go to [facility]. You will need a driver. Nothing to eat or drink after midnight. Expect to be there several hours.  *STOP COUMADIN 4 DAYS PRIOR TO PROCEDURE*  If this date/time does not work you will need to call [facility] at [phone] to reschedule directly. You will need to let them know this is coordinated with physical therapy.

## 2018-02-15 ENCOUNTER — Other Ambulatory Visit: Payer: Self-pay | Admitting: Radiology

## 2018-02-20 ENCOUNTER — Ambulatory Visit (INDEPENDENT_AMBULATORY_CARE_PROVIDER_SITE_OTHER): Payer: Medicare Other | Admitting: Endocrinology

## 2018-02-20 ENCOUNTER — Encounter: Payer: Self-pay | Admitting: Endocrinology

## 2018-02-20 VITALS — BP 116/62 | HR 109 | Ht 66.0 in | Wt 188.2 lb

## 2018-02-20 DIAGNOSIS — E890 Postprocedural hypothyroidism: Secondary | ICD-10-CM | POA: Diagnosis not present

## 2018-02-20 DIAGNOSIS — D3501 Benign neoplasm of right adrenal gland: Secondary | ICD-10-CM

## 2018-02-20 DIAGNOSIS — E213 Hyperparathyroidism, unspecified: Secondary | ICD-10-CM

## 2018-02-20 DIAGNOSIS — M85852 Other specified disorders of bone density and structure, left thigh: Secondary | ICD-10-CM | POA: Diagnosis not present

## 2018-02-20 HISTORY — DX: Benign neoplasm of right adrenal gland: D35.01

## 2018-02-20 LAB — COMPREHENSIVE METABOLIC PANEL
ALT: 12 U/L (ref 0–35)
AST: 21 U/L (ref 0–37)
Albumin: 4.2 g/dL (ref 3.5–5.2)
Alkaline Phosphatase: 112 U/L (ref 39–117)
BILIRUBIN TOTAL: 0.9 mg/dL (ref 0.2–1.2)
BUN: 9 mg/dL (ref 6–23)
CO2: 31 mEq/L (ref 19–32)
CREATININE: 0.83 mg/dL (ref 0.40–1.20)
Calcium: 10 mg/dL (ref 8.4–10.5)
Chloride: 102 mEq/L (ref 96–112)
GFR: 71.24 mL/min (ref >60.00)
Glucose, Bld: 168 mg/dL — ABNORMAL HIGH (ref 70–99)
Potassium: 3.8 mEq/L (ref 3.5–5.1)
Sodium: 141 mEq/L (ref 135–145)
Total Protein: 7.2 g/dL (ref 6.0–8.3)

## 2018-02-20 LAB — T4, FREE: Free T4: 0.88 ng/dL (ref 0.60–1.60)

## 2018-02-20 LAB — VITAMIN D 25 HYDROXY (VIT D DEFICIENCY, FRACTURES): VITD: 67.9 ng/mL (ref 30.00–100.00)

## 2018-02-20 LAB — TSH: TSH: 0.6 u[IU]/mL (ref 0.35–4.50)

## 2018-02-20 NOTE — Progress Notes (Addendum)
Patient ID: [name] [name], female   DOB: 11/14/1945, 72 y.o.   MRN: [id number]          Chief complaint: Evaluation of parathyroid function  History of Present Illness:  Referring physician:  In 05/23/17 she had a high calcium of 10.7 probably done on a routine test Her PCP evaluated her with parathyroid hormone level and this was done from [facility] and mildly increased, result was 70 with normal <64 Repeat PTH in June was 78 However her calcium has been consistently normal subsequently She is not taking any calcium supplements Has been on vitamin D supplementation for her previously low level, 5000 units dosage daily  Patient has had a history of kidney stones and she thinks she passed 1 in 2018 Her abdominal CT scan does show small right renal stones in October 2019  She has no history of known fractures, unusual back pain or any known malignancies  Lab Results  Component Value Date   CALCIUM 9.5 02/11/2018   CALCIUM 9.5 12/20/2017   CALCIUM 9.3 12/09/2017   CALCIUM 10.0 10/24/2017   CALCIUM 9.3 10/16/2017   CALCIUM 8.6 (L) 10/14/2017   CALCIUM 8.8 (L) 10/13/2017   CALCIUM 9.9 10/02/2017   CALCIUM 9.6 08/14/2017     Prior serologic and radiologic studies have included:  Lab Results  Component Value Date   PTH 78 (H) 08/14/2017   CALCIUM 9.5 02/11/2018   PHOS 3.2 04/29/2016   25 (OH) Vitamin D level:  Lab Results  Component Value Date   VD25OH 44.14 08/14/2017   VD25OH 43.21 11/24/2015   BONE DENSITY: Last evaluated in 10/19 showing lowest T score of -1.5 at the left femoral neck 10-year probability of fracture is 10.3% and hip fracture 1.9%      Allergies as of 02/20/2018      Reactions   Crestor [rosuvastatin Calcium] Other (See Comments)   Feeling very bad, aching all over.   Erythromycin Hives   Gabapentin Other (See Comments)   Blurred vision   Pregabalin Other (See Comments)   Crossed vision.   Pregabalin Other (See Comments)   Crossed  vision. Unknown   Macrobid [nitrofurantoin] Hives   Losartan Itching      Medication List       Accurate as of February 20, 2018  2:27 PM. Always use your most recent med list.        acetaminophen 500 MG tablet Commonly known as:  TYLENOL Take 500 mg by mouth every 6 (six) hours as needed for mild pain.   amLODipine 2.5 MG tablet Commonly known as:  NORVASC Take 1 tablet (2.5 mg total) by mouth daily.   atorvastatin 10 MG tablet Commonly known as:  LIPITOR Take 1 tablet (10 mg total) by mouth daily.   BIOFREEZE EX Apply 1 application topically daily as needed (for pain).   carbidopa-levodopa 25-100 MG tablet Commonly known as:  SINEMET IR 2 in the morning, 1 in the afternoon, 1 in the evening   cephALEXin 250 MG capsule Commonly known as:  KEFLEX Take 250 mg by mouth daily.   ketoconazole 2 % shampoo Commonly known as:  NIZORAL Apply 1 application topically 2 (two) times a week.   levothyroxine 125 MCG tablet Commonly known as:  SYNTHROID, LEVOTHROID Take 1 tablet (125 mcg total) by mouth daily.   magnesium oxide 400 (241.3 Mg) MG tablet Commonly known as:  MAG-OX Take 0.5 tablets (200 mg total) by mouth daily.   Melatonin 5 MG Tabs Take  1 tablet by mouth at bedtime as needed (for sleep).   nicotine polacrilex 2 MG gum Commonly known as:  NICORETTE Take 2 mg by mouth as needed for smoking cessation.   ondansetron 4 MG disintegrating tablet Commonly known as:  ZOFRAN-ODT DISSOLVE 1 TABLET IN MOUTH EVERY 8 HOURS AS NEEDED FOR NAUSEA AND VOMITING   venlafaxine XR 150 MG 24 hr capsule Commonly known as:  EFFEXOR-XR Take 1 capsule (150 mg total) by mouth daily with breakfast.   vitamin B-12 500 MCG tablet Commonly known as:  CYANOCOBALAMIN Take 500 mcg by mouth daily.   Vitamin D3 125 MCG (5000 UT) Caps Take 5,000 Units by mouth daily.   warfarin 4 MG tablet Commonly known as:  COUMADIN Take as directed by the anticoagulation clinic. If you are  unsure how to take this medication, talk to your nurse or doctor. Original instructions:  USE AS DIRECTED BY MD   warfarin 4 MG tablet Commonly known as:  COUMADIN Take as directed by the anticoagulation clinic. If you are unsure how to take this medication, talk to your nurse or doctor. Original instructions:  Take 1 tablet (4 mg total) by mouth daily. Adjust as directed by MD   zolpidem 5 MG tablet Commonly known as:  AMBIEN Take 1 tablet (5 mg total) by mouth at bedtime as needed for sleep. Pt requests brand only. Do not combine with any other sedating substance       Allergies:  Allergies  Allergen Reactions  . Crestor [Rosuvastatin Calcium] Other (See Comments)    Feeling very bad, aching all over.  . Erythromycin Hives  . Gabapentin Other (See Comments)    Blurred vision  . Pregabalin Other (See Comments)    Crossed vision.  . Pregabalin Other (See Comments)    Crossed vision. Unknown   . Macrobid [Nitrofurantoin] Hives  . Losartan Itching    Past Medical History:  Diagnosis Date  . Arthritis   . Chronic insomnia   . Complication of anesthesia    severe itching night of surgery requiring meds- also couldnt swallow- throat "paralyzed""  . Fibromyalgia   . Glaucoma   . Hypercholesterolemia   . Hyperlipidemia   . Hypertension    PCP Dr Cari Caraway- [place]  05/15/11 with clearance and note on chart,    chest x ray, EKG 12/12 [facility], eccho 7/11 [facility]  . Hypothyroidism    s/p graves disease  . Kidney stone   . PD (Parkinson's disease) ([place])   . Peripheral vascular disease ([place])    PULMONARY EMBOLUS x 2- 2011, 2012/ FOLLOWED BY DR [id number]-LOV 12/12 [facility]   PT [facility]AES WILL STOP COUMADIN 3/12 and begin LOVONOX 05/17/11 as previously instructed  . Prediabetes   . Sinus infection    at present- dr Honor Loh PA aware- was seen there and at PCP 05/10/11  . Thyroid disease    Hypothyroidism    Past Surgical History:  Procedure Laterality Date  . ABDOMINAL HYSTERECTOMY    .  APPENDECTOMY    . CATARACT EXTRACTION Bilateral   . CHOLECYSTECTOMY    . COLONOSCOPY    . EXPLORATORY LAPAROTOMY    . JOINT REPLACEMENT     left knee  7/12  . TOTAL KNEE ARTHROPLASTY  05/22/2011   Procedure: TOTAL KNEE ARTHROPLASTY;  Surgeon: Mauri Pole, MD;  Location: [facility];  Service: Orthopedics;  Laterality: Right;    Family History  Problem Relation Age of Onset  . Prostate cancer Father   .  Stroke Father     Social History:  reports that she quit smoking about 28 years ago. She smoked 1.00 pack per day. She has never used smokeless tobacco. She reports current alcohol use. She reports that she does not use drugs.  Review of Systems  Constitutional: Negative for weight loss.  HENT: Negative for headaches.   Respiratory: Negative for shortness of breath.   Cardiovascular: Negative for leg swelling.  Gastrointestinal: Negative for constipation, diarrhea and abdominal pain.  Endocrine: Negative for fatigue.  Genitourinary: Negative for dysuria.  Musculoskeletal: Positive for back pain.       She has mild chronic back pain  Skin: Negative for rash.  Neurological: Positive for balance difficulty. Negative for weakness and tremors.   HYPOTHYROIDISM: This was diagnosed after thyroid ablation in 1991 for Graves' disease  She is now taking 125 mcg levothyroxine prescribed by her PCP Previously was taking 150 mcg that was reduced in 10/19 when TSH was very low She does not usually feel any different with the dosage changes  Lab Results  Component Value Date   TSH 0.04 (L) 12/20/2017   TSH 0.847 02/12/2017   TSH 0.505 11/24/2016   FREET4 1.23 (H) 11/23/2016   FREET4 1.64 09/11/2009   FREET4 1.41 12/12/2008   She has an incidental 1.9 cm right adrenal adenoma on her last CT scan    EXAM:  BP 116/62 (BP Location: Right Arm, Patient Position: Sitting, Cuff Size: Large)   Pulse (!) 109   Ht 5\' 6"  (1.676 m)   Wt 188 lb 3.2 oz (85.4 kg)   SpO2 95%   BMI 30.38 kg/m    GENERAL: Averagely built with mild obesity  No pallor, clubbing, lymphadenopathy or peripheral edema.    Skin:  no rash or pigmentation.  EYES:  Externally normal, no swelling  ENT: Oral mucosa and tongue normal.  THYROID:  Not palpable.   HEART:  Normal  S1 and S2; no murmur or click.  CHEST:  Normal shape Lungs:   Vescicular breath sounds heard equally.  No crepitations/ wheeze.  ABDOMEN:  No distention.  Liver and spleen not palpable.  No other mass or tenderness.  NEUROLOGICAL: .No rigidity of upper extremities present Reflexes are slightly brisk bilaterally at biceps.  SPINE AND JOINTS: Has mild kyphosis of the dorsal spine.  No tenderness Peripheral joints appear normal  Assessment/Plan:   She has multiple endocrine problems as follows Previous radiological studies, lab results, notes from multiple providers were reviewed in detail obtaining the history  HYPERCALCEMIA:  She has had a high calcium level only once in 3/19 Also has previously had high PTH levels done from [facility] and not clear if this is accurate  She is appearing asymptomatic with persistently normal calcium levels subsequently Also she has not had any recent activity of her nephrolithiasis with only finding of small kidney stones in October However not clear if her kidney stones were calcium containing or whether she has had any hypercalciuria Currently not on any calcium supplements but on high-dose OTC vitamin D Has reportedly had vitamin D deficiency previously  Recommendations: Will check PTH from [facility] Check urine calcium over 24 hours If she has significant excess urine calcium may consider HCTZ rather than parathyroid surgery Also will recheck her vitamin D level today in follow-up  Follow-up bone density every 2 years for osteopenia  HYPOTHYROIDISM: She has had a recent reduction of her levothyroxine dose down to 125 mcg and needs follow-up thyroid labs which will  be checked  today  Right adrenal adenoma: This is likely an incidental finding and clinically she has no evidence of cortisol excess, history of labile hypertension or hypokalemia to suggest a functional lesion  Elayne Snare 02/20/2018, 2:27 PM

## 2018-02-21 ENCOUNTER — Ambulatory Visit (HOSPITAL_COMMUNITY): Payer: Medicare Other

## 2018-02-21 ENCOUNTER — Ambulatory Visit (HOSPITAL_COMMUNITY)
Admission: RE | Admit: 2018-02-21 | Discharge: 2018-02-21 | Disposition: A | Discharge status: Home / Self Care | Payer: Medicare Other | Source: Ambulatory Visit | Attending: Neurology | Admitting: Neurology

## 2018-02-21 ENCOUNTER — Other Ambulatory Visit: Payer: Self-pay

## 2018-02-21 DIAGNOSIS — G971 Other reaction to spinal and lumbar puncture: Secondary | ICD-10-CM | POA: Diagnosis not present

## 2018-02-21 DIAGNOSIS — G919 Hydrocephalus, unspecified: Secondary | ICD-10-CM | POA: Diagnosis not present

## 2018-02-21 DIAGNOSIS — Z0389 Encounter for observation for other suspected diseases and conditions ruled out: Secondary | ICD-10-CM | POA: Diagnosis not present

## 2018-02-21 LAB — GLUCOSE, CSF: Glucose, CSF: 63 mg/dL (ref 40–70)

## 2018-02-21 LAB — CSF CELL COUNT WITH DIFFERENTIAL
RBC Count, CSF: 2 /mm3 — ABNORMAL HIGH
Tube #: 3
WBC, CSF: 1 /mm3 (ref 0–5)

## 2018-02-21 LAB — PROTIME-INR
INR: 1.24
Prothrombin Time: 15.5 seconds — ABNORMAL HIGH (ref 11.4–15.2)

## 2018-02-21 LAB — PROTEIN, CSF: Total  Protein, CSF: 31 mg/dL (ref 15–45)

## 2018-02-21 LAB — PARATHYROID HORMONE, INTACT (NO CA): PTH: 57 pg/mL (ref 15–65)

## 2018-02-21 MED ORDER — LIDOCAINE HCL (PF) 1 % IJ SOLN
5.0000 mL | Freq: Once | INTRAMUSCULAR | Status: AC
  Administered 2018-02-21: 5 mL via INTRADERMAL

## 2018-02-21 NOTE — Progress Notes (Signed)
Contacted Dr. [place] Crane regarding when pt can resume Coumadin. OK to resume in am.  Also said pt. Can go home now.

## 2018-02-21 NOTE — Discharge Instructions (Signed)
lp  Lumbar Puncture, Care After This sheet gives you information about how to care for yourself after your procedure. Your health care provider may also give you more specific instructions. If you have problems or questions, contact your health care provider. What can I expect after the procedure? After the procedure, it is common to have:  Mild discomfort or pain at the puncture site.  A mild headache that is relieved with pain medicines. Follow these instructions at home: Activity   Lie down flat or rest for as long as directed by your health care provider.  Return to your normal activities as told by your health care provider. Ask your health care provider what activities are safe for you.  Avoid lifting anything heavier than 10 lb (4.5 kg) for at least 12 hours after the procedure.  Do not drive for 24 hours if you were given a medicine to help you relax (sedative) during your procedure.  Do not drive or use heavy machinery while taking prescription pain medicine. Puncture site care  Remove or change your bandage (dressing) as told by your health care provider.  Check your puncture area every day for signs of infection. Check for: ? More pain. ? Redness or swelling. ? Fluid or blood leaking from the puncture site. ? Warmth. ? Pus or a bad smell. General instructions  Take over-the-counter and prescription medicines only as told by your health care provider.  Drink enough fluids to keep your urine clear or pale yellow. Your health care provider may recommend drinking caffeine to prevent a headache.  Keep all follow-up visits as told by your health care provider. This is important. Contact a health care provider if:  You have fever or chills.  You have nausea or vomiting.  You have a headache that lasts for more than 2 days or does not get better with medicine. Get help right away if:  You develop any of the following in your  legs: ? Weakness. ? Numbness. ? Tingling.  You are unable to control when you urinate or have a bowel movement (incontinence).  You have signs of infection around your puncture site, such as: ? More pain. ? Redness or swelling. ? Fluid or blood leakage. ? Warmth. ? Pus or a bad smell.  You are dizzy or you feel like you might faint.  You have a severe headache, especially when you sit or stand. Summary  A lumbar puncture is a procedure in which a small needle is inserted into the lower back to remove fluid that surrounds the brain and spinal cord.  After this procedure, it is common to have a headache and pain around the needle insertion area.  Lying flat, staying hydrated, and drinking caffeine can help prevent headaches.  Monitor your needle insertion site for signs of infection, including warmth, fluid, or more pain.  Get help right away if you develop leg weakness, leg numbness, incontinence, or severe headaches. This information is not intended to replace advice given to you by your health care provider. Make sure you discuss any questions you have with your health care provider. Document Released: 02/25/2013 Document Revised: 04/05/2016 Document Reviewed: 04/05/2016 Elsevier Interactive Patient Education  2019 [employer].

## 2018-02-21 NOTE — Procedures (Signed)
Procedure: Large Vol LP with Fluoro guidance in conjunction with PT Testing. Specimen: CSF, 30 mL.   To Lab Bleeding: Minimal. Complications: None immediate. Patient   -Condition: Stable.  -Disposition:  To Short Stay for further PT testing.  Then D/c to home later if recovery uneventful  Full Radiology report to follow under IMAGING

## 2018-02-22 ENCOUNTER — Telehealth: Payer: Self-pay | Admitting: Neurology

## 2018-02-22 NOTE — Telephone Encounter (Signed)
Jade, let patient know that I got physical therapy assessment from her lumbar puncture.  This reported "some mild improvements in coordination testing, similar assistance level and static standing balance scores, similar cognitive presentation.  However, significant improvement in gait speed and step length during gait.  TUG score/time also dropped greater than 10 seconds, which is significant improvement."  Physical therapist stated that she was still a high fall risk and would benefit from home therapy for balance and gait training.  Jade, let the patient know that I am not sure that this was a big enough improvement to proceed with shunt (generally these are more than would be considered mild improvement), but we certainly could get a neurosurgical opinion if she would like.  If she would like, please refer to Dr. Vertell Limber.  Also, refer to therapy as is recommended, if she is agreeable.

## 2018-02-22 NOTE — Telephone Encounter (Signed)
Patient called back and left vm. I called back and left another message for her to call me back.

## 2018-02-22 NOTE — Telephone Encounter (Signed)
Left message on machine for patient to call back.

## 2018-02-24 LAB — CSF CULTURE W GRAM STAIN: Culture: NO GROWTH

## 2018-02-24 LAB — CSF CULTURE

## 2018-02-25 NOTE — Telephone Encounter (Signed)
Pt would like Jade to call her pls

## 2018-02-25 NOTE — Telephone Encounter (Signed)
Spoke with patient and made her aware of results/recommendations. She wants to hold on referral for now. She states she didn't feel like it made a difference. She will call if she changes her mind. She is currently undergoing PT right now.

## 2018-03-04 ENCOUNTER — Ambulatory Visit (INDEPENDENT_AMBULATORY_CARE_PROVIDER_SITE_OTHER): Payer: Medicare Other

## 2018-03-04 DIAGNOSIS — Z7901 Long term (current) use of anticoagulants: Secondary | ICD-10-CM

## 2018-03-04 LAB — PROTIME-INR: INR: 1.6 — AB (ref 0.9–1.1)

## 2018-03-04 LAB — POCT INR: INR: 1.6 — AB (ref 2.0–3.0)

## 2018-03-04 NOTE — Progress Notes (Signed)
I discussed this case with Vernie Shanks, RN as below.  I agree with her documentation

## 2018-03-04 NOTE — Patient Instructions (Signed)
Coumadin 4 mg 6 days per week and 6 mg 1 day per week.  Return for INR check in 1 week.

## 2018-03-04 NOTE — Progress Notes (Signed)
Pre visit review using our clinic tool,if applicable. No additional management support is needed unless otherwise documented below in the visit note.   Pt here for INR check per order from Dr. Lorelei Pont.   Goal INR = 2.0-3.0  Last INR = 1.9 on 02/11/18  *Pt currently takes Coumadin  4 mg daily.  Pt denies recent antibiotics, no dietary changes and no unusual bruising / bleeding.  INR today = 1.6  Pt advised per Dr. Lorelei Pont take Coumadin 4 mg 6 days per week and 6 mg 1 day. Return for INR check in 1 week.

## 2018-03-12 ENCOUNTER — Other Ambulatory Visit: Payer: Self-pay | Admitting: Family Medicine

## 2018-03-12 ENCOUNTER — Telehealth: Payer: Self-pay | Admitting: Neurology

## 2018-03-12 MED ORDER — CARBIDOPA-LEVODOPA 25-100 MG PO TABS: ORAL_TABLET | ORAL | 1 refills | Status: DC

## 2018-03-12 NOTE — Telephone Encounter (Signed)
Patient would like for [name] to call in the carbidopa levodopa in to the [facility] she is out of medication

## 2018-03-12 NOTE — Telephone Encounter (Signed)
Copied from [place] [id number]. Topic: Quick Communication - Rx Refill/Question >> Mar 12, 2018 12:26 PM Bea Graff, NT wrote: Medication: carbidopa-levodopa (SINEMET IR) 25-100 MG tablet  Has the patient contacted their pharmacy? Yes.   (Agent: If no, request that the patient contact the pharmacy for the refill.) (Agent: If yes, when and what did the pharmacy advise?)  Preferred Pharmacy (with phone number or street name): [place], [place] [phone] (Phone) [phone] (Fax)    Agent: Please be advised that RX refills may take up to 3 business days. We ask that you follow-up with your pharmacy.

## 2018-03-12 NOTE — Telephone Encounter (Signed)
RX sent as requested 

## 2018-03-13 ENCOUNTER — Ambulatory Visit (INDEPENDENT_AMBULATORY_CARE_PROVIDER_SITE_OTHER): Payer: Medicare Other

## 2018-03-13 DIAGNOSIS — Z7901 Long term (current) use of anticoagulants: Secondary | ICD-10-CM | POA: Diagnosis not present

## 2018-03-13 LAB — POCT INR: INR: 2.4 (ref 2.0–3.0)

## 2018-03-13 NOTE — Progress Notes (Signed)
I have reviewed the above note by Vernie Shanks, and agree with her documentation  Denny Peon MD

## 2018-03-13 NOTE — Progress Notes (Signed)
Pre visit review using our clinic tool,if applicable. No additional management support is needed unless otherwise documented below in the visit note.   Pt here for INR check per order from Lamar Blinks, M.D.  Goal INR = 2.0-3.0  Last INR = 1.6  Pt currently takes Coumadin 4 mg 6 days per week and 6 mg 1 day per week.  Pt denies recent antibiotics, no dietary changes and no unusual bruising / bleeding.  INR today = 2.4  Pt advised per Dr. Lorelei Pont continue Coumadin as is and return in 3 weeks for INR re-check. Appointment scheduled.

## 2018-03-15 ENCOUNTER — Other Ambulatory Visit: Payer: Self-pay | Admitting: Family Medicine

## 2018-03-15 DIAGNOSIS — G4700 Insomnia, unspecified: Secondary | ICD-10-CM

## 2018-03-16 NOTE — Telephone Encounter (Signed)
02/16/2018  1   12/20/2017  Zolpidem Tartrate 5 Mg Tablet  30.00 30 Je Cop  [id number]  [id number]  2/2 0.25 LME Medicare  [place]  01/20/2018  1   12/20/2017  Zolpidem Tartrate 5 Mg Tablet  30.00 30 Je Cop  [id number]  [facility] ([id number])  1/2 0.25 LME Medicare  [place]  12/21/2017  1   12/20/2017  Zolpidem Tartrate 5 Mg Tablet  30.00 30 Je Cop  [id number]  [facility] ([id number])  0/2 0.25 LME Medicare  [place]  11/22/2017  1   09/17/2017  Zolpidem Tartrate 5 Mg Tablet  30.00 30 Je Cop  [id number]  [id number]  2/2 0.25 LME Medicare  [place]  10/24/2017  1   09/17/2017  Zolpidem Tartrate 5 Mg Tablet  30.00 30 Je Cop  [id number]  [id number]  1/2 0.25 LME Medicare  [place]  09/24/2017  1   09/17/2017  Zolpidem Tartrate 5 Mg Tablet  30.00 30 Je Cop  [id number]  [id number]  0/2 0.25 LME Medicare  [place]  08/28/2017  1   05/09/2017  Zolpidem Tartrate 5 Mg Tablet  30.00 30 Je Cop  [id number]  [facility] ([id number])  2/2 0.25 LME Medicare  [place]  07/31/2017  1   05/09/2017  Zolpidem Tartrate 5 Mg Tablet  30.00 30 Je Cop  [id number]  [id number]  1/2      Last visit with me in October NCCSR ok as above Can refill We have tried and failed to get her off [place]

## 2018-03-19 DIAGNOSIS — I10 Essential (primary) hypertension: Secondary | ICD-10-CM | POA: Diagnosis not present

## 2018-03-19 DIAGNOSIS — R110 Nausea: Secondary | ICD-10-CM | POA: Diagnosis not present

## 2018-03-19 DIAGNOSIS — M549 Dorsalgia, unspecified: Secondary | ICD-10-CM | POA: Diagnosis not present

## 2018-03-19 DIAGNOSIS — W0110XA Fall on same level from slipping, tripping and stumbling with subsequent striking against unspecified object, initial encounter: Secondary | ICD-10-CM | POA: Diagnosis not present

## 2018-03-19 DIAGNOSIS — I4891 Unspecified atrial fibrillation: Secondary | ICD-10-CM | POA: Diagnosis not present

## 2018-03-19 DIAGNOSIS — S0003XA Contusion of scalp, initial encounter: Secondary | ICD-10-CM | POA: Diagnosis not present

## 2018-03-19 DIAGNOSIS — Y998 Other external cause status: Secondary | ICD-10-CM | POA: Diagnosis not present

## 2018-03-19 DIAGNOSIS — S0990XA Unspecified injury of head, initial encounter: Secondary | ICD-10-CM | POA: Diagnosis not present

## 2018-03-19 DIAGNOSIS — W19XXXA Unspecified fall, initial encounter: Secondary | ICD-10-CM | POA: Diagnosis not present

## 2018-03-19 DIAGNOSIS — R52 Pain, unspecified: Secondary | ICD-10-CM | POA: Diagnosis not present

## 2018-03-19 DIAGNOSIS — S0093XA Contusion of unspecified part of head, initial encounter: Secondary | ICD-10-CM | POA: Diagnosis not present

## 2018-03-20 DIAGNOSIS — Y9301 Activity, walking, marching and hiking: Secondary | ICD-10-CM | POA: Diagnosis not present

## 2018-03-20 DIAGNOSIS — I951 Orthostatic hypotension: Secondary | ICD-10-CM | POA: Diagnosis not present

## 2018-03-20 DIAGNOSIS — M25561 Pain in right knee: Secondary | ICD-10-CM | POA: Diagnosis not present

## 2018-03-20 DIAGNOSIS — Z23 Encounter for immunization: Secondary | ICD-10-CM | POA: Diagnosis not present

## 2018-03-20 DIAGNOSIS — Z66 Do not resuscitate: Secondary | ICD-10-CM | POA: Diagnosis not present

## 2018-03-20 DIAGNOSIS — M25551 Pain in right hip: Secondary | ICD-10-CM | POA: Diagnosis not present

## 2018-03-20 DIAGNOSIS — R2681 Unsteadiness on feet: Secondary | ICD-10-CM | POA: Diagnosis not present

## 2018-03-20 DIAGNOSIS — R41840 Attention and concentration deficit: Secondary | ICD-10-CM | POA: Diagnosis not present

## 2018-03-20 DIAGNOSIS — S0990XA Unspecified injury of head, initial encounter: Secondary | ICD-10-CM | POA: Diagnosis not present

## 2018-03-20 DIAGNOSIS — W1839XA Other fall on same level, initial encounter: Secondary | ICD-10-CM | POA: Diagnosis not present

## 2018-03-20 DIAGNOSIS — S0003XA Contusion of scalp, initial encounter: Secondary | ICD-10-CM | POA: Diagnosis not present

## 2018-03-20 DIAGNOSIS — D6859 Other primary thrombophilia: Secondary | ICD-10-CM | POA: Diagnosis not present

## 2018-03-20 DIAGNOSIS — S51002A Unspecified open wound of left elbow, initial encounter: Secondary | ICD-10-CM | POA: Diagnosis not present

## 2018-03-20 DIAGNOSIS — E039 Hypothyroidism, unspecified: Secondary | ICD-10-CM | POA: Diagnosis not present

## 2018-03-20 DIAGNOSIS — R51 Headache: Secondary | ICD-10-CM | POA: Diagnosis not present

## 2018-03-20 DIAGNOSIS — S79911A Unspecified injury of right hip, initial encounter: Secondary | ICD-10-CM | POA: Diagnosis not present

## 2018-03-20 DIAGNOSIS — S199XXA Unspecified injury of neck, initial encounter: Secondary | ICD-10-CM | POA: Diagnosis not present

## 2018-03-20 DIAGNOSIS — S51001A Unspecified open wound of right elbow, initial encounter: Secondary | ICD-10-CM | POA: Diagnosis not present

## 2018-03-20 DIAGNOSIS — E860 Dehydration: Secondary | ICD-10-CM | POA: Diagnosis not present

## 2018-03-23 DIAGNOSIS — S199XXA Unspecified injury of neck, initial encounter: Secondary | ICD-10-CM | POA: Diagnosis not present

## 2018-03-23 DIAGNOSIS — Y9301 Activity, walking, marching and hiking: Secondary | ICD-10-CM | POA: Diagnosis not present

## 2018-03-23 DIAGNOSIS — R41840 Attention and concentration deficit: Secondary | ICD-10-CM | POA: Diagnosis not present

## 2018-03-23 DIAGNOSIS — I679 Cerebrovascular disease, unspecified: Secondary | ICD-10-CM | POA: Diagnosis present

## 2018-03-23 DIAGNOSIS — E785 Hyperlipidemia, unspecified: Secondary | ICD-10-CM | POA: Diagnosis present

## 2018-03-23 DIAGNOSIS — G20 Parkinson's disease: Secondary | ICD-10-CM | POA: Diagnosis not present

## 2018-03-23 DIAGNOSIS — Z9181 History of falling: Secondary | ICD-10-CM | POA: Diagnosis present

## 2018-03-23 DIAGNOSIS — S0083XA Contusion of other part of head, initial encounter: Secondary | ICD-10-CM | POA: Diagnosis not present

## 2018-03-23 DIAGNOSIS — E0500 Thyrotoxicosis with diffuse goiter without thyrotoxic crisis or storm: Secondary | ICD-10-CM | POA: Diagnosis not present

## 2018-03-23 DIAGNOSIS — R2681 Unsteadiness on feet: Secondary | ICD-10-CM | POA: Diagnosis not present

## 2018-03-23 DIAGNOSIS — Z743 Need for continuous supervision: Secondary | ICD-10-CM | POA: Diagnosis not present

## 2018-03-23 DIAGNOSIS — Z7901 Long term (current) use of anticoagulants: Secondary | ICD-10-CM | POA: Diagnosis not present

## 2018-03-23 DIAGNOSIS — Z881 Allergy status to other antibiotic agents status: Secondary | ICD-10-CM | POA: Diagnosis not present

## 2018-03-23 DIAGNOSIS — E876 Hypokalemia: Secondary | ICD-10-CM | POA: Diagnosis not present

## 2018-03-23 DIAGNOSIS — R001 Bradycardia, unspecified: Secondary | ICD-10-CM | POA: Diagnosis not present

## 2018-03-23 DIAGNOSIS — Z7982 Long term (current) use of aspirin: Secondary | ICD-10-CM | POA: Diagnosis not present

## 2018-03-23 DIAGNOSIS — S0093XS Contusion of unspecified part of head, sequela: Secondary | ICD-10-CM | POA: Diagnosis present

## 2018-03-23 DIAGNOSIS — S0003XA Contusion of scalp, initial encounter: Secondary | ICD-10-CM | POA: Diagnosis not present

## 2018-03-23 DIAGNOSIS — H9190 Unspecified hearing loss, unspecified ear: Secondary | ICD-10-CM | POA: Diagnosis present

## 2018-03-23 DIAGNOSIS — M5489 Other dorsalgia: Secondary | ICD-10-CM | POA: Diagnosis not present

## 2018-03-23 DIAGNOSIS — I959 Hypotension, unspecified: Secondary | ICD-10-CM | POA: Diagnosis not present

## 2018-03-23 DIAGNOSIS — R296 Repeated falls: Secondary | ICD-10-CM | POA: Diagnosis present

## 2018-03-23 DIAGNOSIS — D6859 Other primary thrombophilia: Secondary | ICD-10-CM | POA: Diagnosis present

## 2018-03-23 DIAGNOSIS — Z86711 Personal history of pulmonary embolism: Secondary | ICD-10-CM | POA: Diagnosis not present

## 2018-03-23 DIAGNOSIS — W19XXXA Unspecified fall, initial encounter: Secondary | ICD-10-CM | POA: Diagnosis not present

## 2018-03-23 DIAGNOSIS — G3184 Mild cognitive impairment, so stated: Secondary | ICD-10-CM | POA: Diagnosis not present

## 2018-03-23 DIAGNOSIS — S51002A Unspecified open wound of left elbow, initial encounter: Secondary | ICD-10-CM | POA: Diagnosis not present

## 2018-03-23 DIAGNOSIS — Z888 Allergy status to other drugs, medicaments and biological substances status: Secondary | ICD-10-CM | POA: Diagnosis not present

## 2018-03-23 DIAGNOSIS — Z96651 Presence of right artificial knee joint: Secondary | ICD-10-CM | POA: Diagnosis present

## 2018-03-23 DIAGNOSIS — R52 Pain, unspecified: Secondary | ICD-10-CM | POA: Diagnosis not present

## 2018-03-23 DIAGNOSIS — W1839XA Other fall on same level, initial encounter: Secondary | ICD-10-CM | POA: Diagnosis not present

## 2018-03-23 DIAGNOSIS — I951 Orthostatic hypotension: Secondary | ICD-10-CM | POA: Diagnosis present

## 2018-03-23 DIAGNOSIS — E860 Dehydration: Secondary | ICD-10-CM | POA: Diagnosis present

## 2018-03-23 DIAGNOSIS — Z66 Do not resuscitate: Secondary | ICD-10-CM | POA: Diagnosis not present

## 2018-03-23 DIAGNOSIS — S51001A Unspecified open wound of right elbow, initial encounter: Secondary | ICD-10-CM | POA: Diagnosis not present

## 2018-03-23 DIAGNOSIS — I2699 Other pulmonary embolism without acute cor pulmonale: Secondary | ICD-10-CM | POA: Diagnosis not present

## 2018-03-23 DIAGNOSIS — S79911A Unspecified injury of right hip, initial encounter: Secondary | ICD-10-CM | POA: Diagnosis not present

## 2018-03-23 DIAGNOSIS — M25551 Pain in right hip: Secondary | ICD-10-CM | POA: Diagnosis not present

## 2018-03-23 DIAGNOSIS — M4692 Unspecified inflammatory spondylopathy, cervical region: Secondary | ICD-10-CM | POA: Diagnosis present

## 2018-03-23 DIAGNOSIS — S0990XA Unspecified injury of head, initial encounter: Secondary | ICD-10-CM | POA: Diagnosis not present

## 2018-03-23 DIAGNOSIS — Z79899 Other long term (current) drug therapy: Secondary | ICD-10-CM | POA: Diagnosis not present

## 2018-03-23 DIAGNOSIS — R531 Weakness: Secondary | ICD-10-CM | POA: Diagnosis not present

## 2018-03-23 DIAGNOSIS — I10 Essential (primary) hypertension: Secondary | ICD-10-CM | POA: Diagnosis not present

## 2018-03-23 DIAGNOSIS — G248 Other dystonia: Secondary | ICD-10-CM | POA: Diagnosis present

## 2018-03-23 DIAGNOSIS — G249 Dystonia, unspecified: Secondary | ICD-10-CM | POA: Diagnosis present

## 2018-03-23 DIAGNOSIS — E039 Hypothyroidism, unspecified: Secondary | ICD-10-CM | POA: Diagnosis not present

## 2018-03-23 DIAGNOSIS — M62838 Other muscle spasm: Secondary | ICD-10-CM | POA: Diagnosis present

## 2018-03-23 DIAGNOSIS — Z886 Allergy status to analgesic agent status: Secondary | ICD-10-CM | POA: Diagnosis not present

## 2018-03-23 DIAGNOSIS — Z79890 Hormone replacement therapy (postmenopausal): Secondary | ICD-10-CM | POA: Diagnosis not present

## 2018-03-23 DIAGNOSIS — J45909 Unspecified asthma, uncomplicated: Secondary | ICD-10-CM | POA: Diagnosis present

## 2018-03-23 DIAGNOSIS — R279 Unspecified lack of coordination: Secondary | ICD-10-CM | POA: Diagnosis not present

## 2018-03-23 DIAGNOSIS — R51 Headache: Secondary | ICD-10-CM | POA: Diagnosis not present

## 2018-03-23 DIAGNOSIS — M25561 Pain in right knee: Secondary | ICD-10-CM | POA: Diagnosis not present

## 2018-03-23 DIAGNOSIS — R0902 Hypoxemia: Secondary | ICD-10-CM | POA: Diagnosis not present

## 2018-03-23 DIAGNOSIS — Z23 Encounter for immunization: Secondary | ICD-10-CM | POA: Diagnosis not present

## 2018-03-26 DIAGNOSIS — G248 Other dystonia: Secondary | ICD-10-CM | POA: Diagnosis present

## 2018-03-26 DIAGNOSIS — E7849 Other hyperlipidemia: Secondary | ICD-10-CM | POA: Diagnosis not present

## 2018-03-26 DIAGNOSIS — S0093XS Contusion of unspecified part of head, sequela: Secondary | ICD-10-CM | POA: Diagnosis present

## 2018-03-26 DIAGNOSIS — E039 Hypothyroidism, unspecified: Secondary | ICD-10-CM | POA: Diagnosis present

## 2018-03-26 DIAGNOSIS — R531 Weakness: Secondary | ICD-10-CM | POA: Diagnosis not present

## 2018-03-26 DIAGNOSIS — F320 Major depressive disorder, single episode, mild: Secondary | ICD-10-CM | POA: Diagnosis not present

## 2018-03-26 DIAGNOSIS — I10 Essential (primary) hypertension: Secondary | ICD-10-CM | POA: Diagnosis not present

## 2018-03-26 DIAGNOSIS — Z9181 History of falling: Secondary | ICD-10-CM | POA: Diagnosis present

## 2018-03-26 DIAGNOSIS — Z743 Need for continuous supervision: Secondary | ICD-10-CM | POA: Diagnosis not present

## 2018-03-26 DIAGNOSIS — Z7901 Long term (current) use of anticoagulants: Secondary | ICD-10-CM | POA: Diagnosis not present

## 2018-03-26 DIAGNOSIS — I959 Hypotension, unspecified: Secondary | ICD-10-CM | POA: Diagnosis not present

## 2018-03-26 DIAGNOSIS — E038 Other specified hypothyroidism: Secondary | ICD-10-CM | POA: Diagnosis not present

## 2018-03-26 DIAGNOSIS — S0083XA Contusion of other part of head, initial encounter: Secondary | ICD-10-CM | POA: Diagnosis not present

## 2018-03-26 DIAGNOSIS — E0500 Thyrotoxicosis with diffuse goiter without thyrotoxic crisis or storm: Secondary | ICD-10-CM | POA: Diagnosis present

## 2018-03-26 DIAGNOSIS — J45909 Unspecified asthma, uncomplicated: Secondary | ICD-10-CM | POA: Diagnosis present

## 2018-03-26 DIAGNOSIS — M4692 Unspecified inflammatory spondylopathy, cervical region: Secondary | ICD-10-CM | POA: Diagnosis present

## 2018-03-26 DIAGNOSIS — W19XXXA Unspecified fall, initial encounter: Secondary | ICD-10-CM | POA: Diagnosis not present

## 2018-03-26 DIAGNOSIS — E785 Hyperlipidemia, unspecified: Secondary | ICD-10-CM | POA: Diagnosis present

## 2018-03-26 DIAGNOSIS — I2699 Other pulmonary embolism without acute cor pulmonale: Secondary | ICD-10-CM | POA: Diagnosis not present

## 2018-03-26 DIAGNOSIS — I679 Cerebrovascular disease, unspecified: Secondary | ICD-10-CM | POA: Diagnosis present

## 2018-03-26 DIAGNOSIS — W1789XA Other fall from one level to another, initial encounter: Secondary | ICD-10-CM | POA: Diagnosis not present

## 2018-03-26 DIAGNOSIS — G20 Parkinson's disease: Secondary | ICD-10-CM | POA: Diagnosis not present

## 2018-03-26 DIAGNOSIS — R279 Unspecified lack of coordination: Secondary | ICD-10-CM | POA: Diagnosis not present

## 2018-03-26 MED ORDER — WARFARIN SODIUM 5 MG PO TABS
5.00 | ORAL_TABLET | ORAL | Status: DC
Start: ? — End: 2018-03-26

## 2018-03-26 MED ORDER — LEVOTHYROXINE SODIUM 125 MCG PO TABS
125.00 | ORAL_TABLET | ORAL | Status: DC
Start: 2018-03-27 — End: 2018-03-26

## 2018-03-26 MED ORDER — ALUMINUM-MAGNESIUM-SIMETHICONE 200-200-20 MG/5ML PO SUSP
30.00 | ORAL | Status: DC
Start: ? — End: 2018-03-26

## 2018-03-26 MED ORDER — PROMETHAZINE HCL 25 MG PO TABS
25.00 | ORAL_TABLET | ORAL | Status: DC
Start: ? — End: 2018-03-26

## 2018-03-26 MED ORDER — ATORVASTATIN CALCIUM 10 MG PO TABS
10.00 | ORAL_TABLET | ORAL | Status: DC
Start: 2018-03-26 — End: 2018-03-26

## 2018-03-26 MED ORDER — GUAIFENESIN-DM 100-10 MG/5ML PO SYRP
5.00 | ORAL_SOLUTION | ORAL | Status: DC
Start: ? — End: 2018-03-26

## 2018-03-26 MED ORDER — VENLAFAXINE HCL ER 150 MG PO CP24
150.00 | ORAL_CAPSULE | ORAL | Status: DC
Start: 2018-03-27 — End: 2018-03-26

## 2018-03-26 MED ORDER — BISACODYL 5 MG PO TBEC
10.00 | DELAYED_RELEASE_TABLET | ORAL | Status: DC
Start: ? — End: 2018-03-26

## 2018-03-26 MED ORDER — MAGNESIUM OXIDE 400 MG PO TABS
400.00 | ORAL_TABLET | ORAL | Status: DC
Start: 2018-03-27 — End: 2018-03-26

## 2018-03-26 MED ORDER — ZOLPIDEM TARTRATE 5 MG PO TABS
5.00 | ORAL_TABLET | ORAL | Status: DC
Start: ? — End: 2018-03-26

## 2018-03-26 MED ORDER — ASPIRIN EC 81 MG PO TBEC
81.00 | DELAYED_RELEASE_TABLET | ORAL | Status: DC
Start: 2018-03-27 — End: 2018-03-26

## 2018-03-26 MED ORDER — AMLODIPINE BESYLATE 2.5 MG PO TABS
2.50 | ORAL_TABLET | ORAL | Status: DC
Start: 2018-03-27 — End: 2018-03-26

## 2018-03-26 MED ORDER — CARBIDOPA-LEVODOPA 25-100 MG PO TABS
2.00 | ORAL_TABLET | ORAL | Status: DC
Start: 2018-03-26 — End: 2018-03-26

## 2018-03-26 MED ORDER — ACETAMINOPHEN 325 MG PO TABS
650.00 | ORAL_TABLET | ORAL | Status: DC
Start: ? — End: 2018-03-26

## 2018-03-29 DIAGNOSIS — I10 Essential (primary) hypertension: Secondary | ICD-10-CM | POA: Diagnosis not present

## 2018-03-29 DIAGNOSIS — G20 Parkinson's disease: Secondary | ICD-10-CM | POA: Diagnosis not present

## 2018-03-29 DIAGNOSIS — W1789XA Other fall from one level to another, initial encounter: Secondary | ICD-10-CM | POA: Diagnosis not present

## 2018-03-29 DIAGNOSIS — I2699 Other pulmonary embolism without acute cor pulmonale: Secondary | ICD-10-CM | POA: Diagnosis not present

## 2018-04-01 ENCOUNTER — Encounter: Payer: Self-pay | Admitting: Family Medicine

## 2018-04-03 ENCOUNTER — Ambulatory Visit: Payer: Medicare Other

## 2018-04-05 ENCOUNTER — Ambulatory Visit: Payer: Medicare Other | Admitting: Neurology

## 2018-04-12 DIAGNOSIS — I10 Essential (primary) hypertension: Secondary | ICD-10-CM | POA: Diagnosis not present

## 2018-04-12 DIAGNOSIS — E7849 Other hyperlipidemia: Secondary | ICD-10-CM | POA: Diagnosis not present

## 2018-04-12 DIAGNOSIS — I2699 Other pulmonary embolism without acute cor pulmonale: Secondary | ICD-10-CM | POA: Diagnosis not present

## 2018-04-12 DIAGNOSIS — F320 Major depressive disorder, single episode, mild: Secondary | ICD-10-CM | POA: Diagnosis not present

## 2018-04-12 DIAGNOSIS — G20 Parkinson's disease: Secondary | ICD-10-CM | POA: Diagnosis not present

## 2018-04-12 DIAGNOSIS — E038 Other specified hypothyroidism: Secondary | ICD-10-CM | POA: Diagnosis not present

## 2018-04-18 ENCOUNTER — Telehealth: Payer: Self-pay | Admitting: Family Medicine

## 2018-04-18 DIAGNOSIS — Z96651 Presence of right artificial knee joint: Secondary | ICD-10-CM | POA: Diagnosis not present

## 2018-04-18 DIAGNOSIS — G249 Dystonia, unspecified: Secondary | ICD-10-CM | POA: Diagnosis not present

## 2018-04-18 DIAGNOSIS — G20 Parkinson's disease: Secondary | ICD-10-CM | POA: Diagnosis not present

## 2018-04-18 DIAGNOSIS — R2689 Other abnormalities of gait and mobility: Secondary | ICD-10-CM | POA: Diagnosis not present

## 2018-04-18 DIAGNOSIS — F0280 Dementia in other diseases classified elsewhere without behavioral disturbance: Secondary | ICD-10-CM | POA: Diagnosis not present

## 2018-04-18 DIAGNOSIS — M47812 Spondylosis without myelopathy or radiculopathy, cervical region: Secondary | ICD-10-CM | POA: Diagnosis not present

## 2018-04-18 DIAGNOSIS — I10 Essential (primary) hypertension: Secondary | ICD-10-CM | POA: Diagnosis not present

## 2018-04-18 DIAGNOSIS — F330 Major depressive disorder, recurrent, mild: Secondary | ICD-10-CM | POA: Diagnosis not present

## 2018-04-18 DIAGNOSIS — M0510 Rheumatoid lung disease with rheumatoid arthritis of unspecified site: Secondary | ICD-10-CM | POA: Diagnosis not present

## 2018-04-18 DIAGNOSIS — Z86711 Personal history of pulmonary embolism: Secondary | ICD-10-CM | POA: Diagnosis not present

## 2018-04-18 DIAGNOSIS — Z7901 Long term (current) use of anticoagulants: Secondary | ICD-10-CM | POA: Diagnosis not present

## 2018-04-18 DIAGNOSIS — Z9181 History of falling: Secondary | ICD-10-CM | POA: Diagnosis not present

## 2018-04-18 NOTE — Telephone Encounter (Signed)
Copied from [place] [id number]. Topic: Quick Communication - Home Health Verbal Orders >> Apr 18, 2018  2:11 PM Leward Quan A wrote: Caller/Agency: Constance Haw / [facility] Number: [phone] ok to LM Requesting OT/PT/Skilled Nursing/Social Work: PT Frequency: 2 wk 3

## 2018-04-19 DIAGNOSIS — F0280 Dementia in other diseases classified elsewhere without behavioral disturbance: Secondary | ICD-10-CM | POA: Diagnosis not present

## 2018-04-19 DIAGNOSIS — G20 Parkinson's disease: Secondary | ICD-10-CM | POA: Diagnosis not present

## 2018-04-19 DIAGNOSIS — M0510 Rheumatoid lung disease with rheumatoid arthritis of unspecified site: Secondary | ICD-10-CM | POA: Diagnosis not present

## 2018-04-19 DIAGNOSIS — R2689 Other abnormalities of gait and mobility: Secondary | ICD-10-CM | POA: Diagnosis not present

## 2018-04-19 DIAGNOSIS — M47812 Spondylosis without myelopathy or radiculopathy, cervical region: Secondary | ICD-10-CM | POA: Diagnosis not present

## 2018-04-19 DIAGNOSIS — G249 Dystonia, unspecified: Secondary | ICD-10-CM | POA: Diagnosis not present

## 2018-04-22 ENCOUNTER — Telehealth: Payer: Self-pay | Admitting: Family Medicine

## 2018-04-22 ENCOUNTER — Ambulatory Visit: Payer: Medicare Other | Admitting: Family Medicine

## 2018-04-22 DIAGNOSIS — M0510 Rheumatoid lung disease with rheumatoid arthritis of unspecified site: Secondary | ICD-10-CM | POA: Diagnosis not present

## 2018-04-22 DIAGNOSIS — G249 Dystonia, unspecified: Secondary | ICD-10-CM | POA: Diagnosis not present

## 2018-04-22 DIAGNOSIS — M47812 Spondylosis without myelopathy or radiculopathy, cervical region: Secondary | ICD-10-CM | POA: Diagnosis not present

## 2018-04-22 DIAGNOSIS — R2689 Other abnormalities of gait and mobility: Secondary | ICD-10-CM | POA: Diagnosis not present

## 2018-04-22 DIAGNOSIS — F0280 Dementia in other diseases classified elsewhere without behavioral disturbance: Secondary | ICD-10-CM | POA: Diagnosis not present

## 2018-04-22 DIAGNOSIS — G20 Parkinson's disease: Secondary | ICD-10-CM | POA: Diagnosis not present

## 2018-04-22 NOTE — Telephone Encounter (Signed)
Copied from [place] [phone]. Topic: Quick Communication - Home Health Verbal Orders >> Apr 22, 2018  4:06 PM Sheran Luz wrote: Caller/Agency: Will, [facility]  Callback Number: [phone] Requesting OT Frequency: 1xw for 3w

## 2018-04-23 DIAGNOSIS — G20 Parkinson's disease: Secondary | ICD-10-CM | POA: Diagnosis not present

## 2018-04-23 DIAGNOSIS — M47812 Spondylosis without myelopathy or radiculopathy, cervical region: Secondary | ICD-10-CM | POA: Diagnosis not present

## 2018-04-23 DIAGNOSIS — G249 Dystonia, unspecified: Secondary | ICD-10-CM | POA: Diagnosis not present

## 2018-04-23 DIAGNOSIS — R2689 Other abnormalities of gait and mobility: Secondary | ICD-10-CM | POA: Diagnosis not present

## 2018-04-23 DIAGNOSIS — F0280 Dementia in other diseases classified elsewhere without behavioral disturbance: Secondary | ICD-10-CM | POA: Diagnosis not present

## 2018-04-23 DIAGNOSIS — M0510 Rheumatoid lung disease with rheumatoid arthritis of unspecified site: Secondary | ICD-10-CM | POA: Diagnosis not present

## 2018-04-23 NOTE — Telephone Encounter (Signed)
Called and gave VO to Will. 

## 2018-04-23 NOTE — Progress Notes (Signed)
[place] [facility] [address], [place], [place] [address] [phone] [id number]  Date:  04/25/2018   Name:  [name] [name]   DOB:  Jul 17, 1945   MRN:  [id number]  PCP:  Darreld Mclean, MD    Chief Complaint: Rehabilitation Follow Up and INR check   History of Present Illness:  [name] [name] is a 73 y.o. very pleasant female patient who presents with the following:  Following up today from rehab History of Parkinson's disease, Coumadin for PE x2 and presumed hypercoagulable state, hypertension, hypothyroidism She was admitted at [facility] towards the end of January following a fall at home  Admit date: 03/23/2018 Discharge date: 03/26/2018  Hospital Course:  73y/o female with a history of Parkinson's disease, diplopia, and cervical dystonia. She lives at the independent living facility at [facility]. Patient is on long-term anticoagulation with warfarin for PE and hypercoagulable state. On the 15th of January, patient fell at home while cooking. Patient had spilled water on the floor, slipped on it and hit her head on the tile floor. Sustained a posterior head hematoma. She was seen and discharged from the ED the next day. The morning of 1/18 around 11 AM son went to patient's home. He found patient on the floor in a pool of vomitus. Patient fell forward and hit her head. She was then admitted from the emergency room. Patient was found to be hypotensive and her blood pressure medications were adjusted during her hospital stay. Today she is stable for SNF admission to continue physical therapy.  BP Readings from Last 3 Encounters:  04/25/18 122/80  02/21/18 (!) 144/59  02/20/18 116/62   She went to rehab at [facility] She was there for about 2 weeks, home for about one week now She is back at [facility], in her independent living apartment All seems to be ok per patient and her daughter.  She has her walker  with her today.  She states that she is determined not to have any more falls She is finally off [place]!  This is great news We will not go back on this  They have some care for her at night right now-her daughter has someone to sit with her in the evenings which is when she seems to be most apt to fall  Here today her daughter and grand-son  Her INR is in range today - she is taking coumadin 4 mg daily  Her daughter would be happy to help with her INR at home, if we could set this up  She will reshceudle with Dr. [name] - her appt got canceled due to her being in the hospital  She continues to take her medication for Parkinson's disease  Amlodipine 2.5 Lipitor 10 Sinemet Levothyroxine Effexor Coumadin  Lab Results  Component Value Date   TSH 0.60 02/20/2018    Patient Active Problem List   Diagnosis Date Noted  . Adenoma of right adrenal gland 02/20/2018  . Osteopenia 12/18/2017  . Glaucoma 10/13/2017  . Transient cerebral ischemic attack 10/13/2017  . History of prediabetes 10/13/2017  . Sepsis secondary to UTI ([place]) 02/12/2017  . AKI (acute kidney injury) ([place]) 02/12/2017  . Recurrent UTI 11/23/2016  . PD (Parkinson's disease) ([place]) 11/23/2016  . Fibromyalgia 11/23/2016  . Hypercholesterolemia 11/23/2016  . Hypertension 11/23/2016  . Hypothyroidism, adult 11/23/2016  . Pulmonary HTN ([place]) 11/23/2016  . Left ventricular diastolic dysfunction 75/64/3329  . History of  pulmonary embolus (PE) 2/2 hypercoagulable state 11/23/2016  . Sepsis ([place]) 09/17/2016  . SIRS (systemic inflammatory response syndrome) ([place]) 04/29/2016  . Hyperlipidemia 04/29/2016  . Hypothyroidism 04/29/2016  . Hematuria 04/05/2016  . Benign essential HTN   . Displaced fracture of fifth cervical vertebra ([place]) 12/17/2015  . Gait disturbance 12/17/2015  . Trochanteric bursitis of both hips 09/13/2015  . Chronic low back pain 06/07/2015  . Thyroid disease 05/17/2015  . Parkinson's disease ([place])  05/17/2015  . S/P right TKA 05/22/2011    Past Medical History:  Diagnosis Date  . Arthritis   . Chronic insomnia   . Complication of anesthesia    severe itching night of surgery requiring meds- also couldnt swallow- throat "paralyzed""  . Fibromyalgia   . Glaucoma   . Hypercholesterolemia   . Hyperlipidemia   . Hypertension    PCP Dr Cari Caraway- [place]  05/15/11 with clearance and note on chart,    chest x ray, EKG 12/12 EPIC, eccho 7/11 EPIC  . Hypothyroidism    s/p graves disease  . Kidney stone   . PD (Parkinson's disease) ([place])   . Peripheral vascular disease ([place])    PULMONARY EMBOLUS x 2- 2011, 2012/ FOLLOWED BY DR [id number] 12/12 EPIC   PT STAES WILL STOP COUMADIN 3/12 and begin LOVONOX 05/17/11 as previously instructed  . Prediabetes   . Sinus infection    at present- dr Honor Loh PA aware- was seen there and at PCP 05/10/11  . Thyroid disease    Hypothyroidism    Past Surgical History:  Procedure Laterality Date  . ABDOMINAL HYSTERECTOMY    . APPENDECTOMY    . CATARACT EXTRACTION Bilateral   . CHOLECYSTECTOMY    . COLONOSCOPY    . EXPLORATORY LAPAROTOMY    . JOINT REPLACEMENT     left knee  7/12  . TOTAL KNEE ARTHROPLASTY  05/22/2011   Procedure: TOTAL KNEE ARTHROPLASTY;  Surgeon: Mauri Pole, MD;  Location: [facility] ORS;  Service: Orthopedics;  Laterality: Right;    Social History   Tobacco Use  . Smoking status: Former Smoker    Packs/day: 1.00    Last attempt to quit: 05/10/1989    Years since quitting: 28.9  . Smokeless tobacco: Never Used  Substance Use Topics  . Alcohol use: Yes    Alcohol/week: 0.0 standard drinks    Comment: one drink once a month  . Drug use: No    Family History  Problem Relation Age of Onset  . Prostate cancer Father   . Stroke Father     Allergies  Allergen Reactions  . Crestor [Rosuvastatin Calcium] Other (See Comments)    Feeling very bad, aching all over.  . Erythromycin Hives  . Gabapentin Other (See Comments)     Blurred vision  . Pregabalin Other (See Comments)    Crossed vision.  . Pregabalin Other (See Comments)    Crossed vision. Unknown   . Macrobid [Nitrofurantoin] Hives  . Losartan Itching    Medication list has been reviewed and updated.  Current Outpatient Medications on File Prior to Visit  Medication Sig Dispense Refill  . acetaminophen (TYLENOL) 500 MG tablet Take 500 mg by mouth every 6 (six) hours as needed for mild pain.    Marland Kitchen amLODipine (NORVASC) 2.5 MG tablet Take 1 tablet (2.5 mg total) by mouth daily. 30 tablet 6  . atorvastatin (LIPITOR) 10 MG tablet Take 1 tablet (10 mg total) by mouth daily. 90 tablet 1  . carbidopa-levodopa (SINEMET  IR) 25-100 MG tablet 2 in the morning, 1 in the afternoon, 1 in the evening 360 tablet 1  . cephALEXin (KEFLEX) 250 MG capsule Take 250 mg by mouth daily.  11  . Cholecalciferol (VITAMIN D3) 5000 UNITS CAPS Take 5,000 Units by mouth daily.     . cyanocobalamin 500 MCG tablet Take 500 mcg by mouth daily.    Marland Kitchen ketoconazole (NIZORAL) 2 % shampoo Apply 1 application topically 2 (two) times a week. 120 mL 3  . levothyroxine (SYNTHROID, LEVOTHROID) 125 MCG tablet Take 1 tablet (125 mcg total) by mouth daily. 30 tablet 3  . magnesium oxide (MAG-OX) 400 (241.3 Mg) MG tablet Take 0.5 tablets (200 mg total) by mouth daily. 30 tablet 0  . Melatonin 5 MG TABS Take 1 tablet by mouth at bedtime as needed (for sleep).    . Menthol, Topical Analgesic, (BIOFREEZE EX) Apply 1 application topically daily as needed (for pain).    . nicotine polacrilex (NICORETTE) 2 MG gum Take 2 mg by mouth as needed for smoking cessation.    . ondansetron (ZOFRAN-ODT) 4 MG disintegrating tablet DISSOLVE 1 TABLET IN MOUTH EVERY 8 HOURS AS NEEDED FOR NAUSEA AND VOMITING 90 tablet 0  . venlafaxine XR (EFFEXOR-XR) 150 MG 24 hr capsule Take 1 capsule (150 mg total) by mouth daily with breakfast. 90 capsule 3  . warfarin (COUMADIN) 4 MG tablet USE AS DIRECTED BY MD 90 tablet 0  .  warfarin (COUMADIN) 4 MG tablet Take 1 tablet (4 mg total) by mouth daily. Adjust as directed by MD 90 tablet 1  . zolpidem (AMBIEN) 5 MG tablet TAKE 1 TABLET BY MOUTH AT BEDTIME AS NEEDED FOR SLEEP 30 tablet 2   No current facility-administered medications on file prior to visit.     Review of Systems:  As per HPI- otherwise negative. No fever or chills She feels as though her Parkinson symptoms are stable Feels well at this time   Physical Examination: Vitals:   04/25/18 1100  BP: 122/80  Pulse: 94  Resp: 16  Temp: 98.2 F (36.8 C)  SpO2: 95%   Vitals:   04/25/18 1100  Weight: 181 lb (82.1 kg)  Height: 5\' 6"  (1.676 m)   Body mass index is 29.21 kg/m. Ideal Body Weight: Weight in (lb) to have BMI = 25: 154.6  GEN: WDWN, NAD, Non-toxic, A & O x 3, overweight, looks well HEENT: Atraumatic, Normocephalic. Neck supple. No masses, No LAD. Ears and Nose: No external deformity. CV: RRR, No M/G/R. No JVD. No thrill. No extra heart sounds. PULM: CTA B, no wheezes, crackles, rhonchi. No retractions. No resp. distress. No accessory muscle use. ABD: S, NT, ND EXTR: No c/c/e NEURO Normal gait for patient- typical of Parkinson's disease, very slow.  She uses a walker She has good strength of bilateral upper and lower extremities today PSYCH: Normally interactive. Conversant. Not depressed or anxious appearing.  Calm demeanor.   Results for orders placed or performed in visit on 04/25/18  POCT INR  Result Value Ref Range   INR 2.2 2.0 - 3.0    Assessment and Plan: Anticoagulant long-term use - Plan: POCT INR  Hospital discharge follow-up  Hypothyroidism, adult  Essential hypertension  Parkinson disease ([place])  Hypercoagulable state ([place])  Following up from hospitalization and rehab stay.  [place] feels like she is doing very well.  She has had no further falls, and has stopped Ambien.  I had tried to get her to stop Ambien for  quite some time, but she had always insisted  she could not sleep without it.  I am very pleased that she is no longer taking it, and advised her that we will not restart this medication  INR is in range today, continue current Coumadin dose.  They are interested in home INR testing, her daughter can help with this.  This would be easier than bringing her into the office all the time.  I will fill out paperwork for her to get a home machine If they cannot get a home machine in a timely fashion, asked her for a repeat INR here in 4 weeks  Continue Sinemet as prescribed for her Parkinson's disease Blood pressure looks fine on lower dose of medication Recent TSH was in range Lab Results  Component Value Date   TSH 0.60 02/20/2018     Plan to check back here in 4 to 5 months  Signed Lamar Blinks, MD

## 2018-04-25 ENCOUNTER — Encounter: Payer: Self-pay | Admitting: Family Medicine

## 2018-04-25 ENCOUNTER — Ambulatory Visit (INDEPENDENT_AMBULATORY_CARE_PROVIDER_SITE_OTHER): Payer: Medicare Other | Admitting: Family Medicine

## 2018-04-25 VITALS — BP 122/80 | HR 94 | Temp 98.2°F | Resp 16 | Ht 66.0 in | Wt 181.0 lb

## 2018-04-25 DIAGNOSIS — Z09 Encounter for follow-up examination after completed treatment for conditions other than malignant neoplasm: Secondary | ICD-10-CM

## 2018-04-25 DIAGNOSIS — D6859 Other primary thrombophilia: Secondary | ICD-10-CM | POA: Diagnosis not present

## 2018-04-25 DIAGNOSIS — I10 Essential (primary) hypertension: Secondary | ICD-10-CM | POA: Diagnosis not present

## 2018-04-25 DIAGNOSIS — G249 Dystonia, unspecified: Secondary | ICD-10-CM | POA: Diagnosis not present

## 2018-04-25 DIAGNOSIS — R2689 Other abnormalities of gait and mobility: Secondary | ICD-10-CM | POA: Diagnosis not present

## 2018-04-25 DIAGNOSIS — E039 Hypothyroidism, unspecified: Secondary | ICD-10-CM

## 2018-04-25 DIAGNOSIS — M0510 Rheumatoid lung disease with rheumatoid arthritis of unspecified site: Secondary | ICD-10-CM | POA: Diagnosis not present

## 2018-04-25 DIAGNOSIS — F0280 Dementia in other diseases classified elsewhere without behavioral disturbance: Secondary | ICD-10-CM | POA: Diagnosis not present

## 2018-04-25 DIAGNOSIS — Z7901 Long term (current) use of anticoagulants: Secondary | ICD-10-CM | POA: Diagnosis not present

## 2018-04-25 DIAGNOSIS — M47812 Spondylosis without myelopathy or radiculopathy, cervical region: Secondary | ICD-10-CM | POA: Diagnosis not present

## 2018-04-25 DIAGNOSIS — G20 Parkinson's disease: Secondary | ICD-10-CM

## 2018-04-25 LAB — POCT INR: INR: 2.2 (ref 2.0–3.0)

## 2018-04-25 NOTE — Patient Instructions (Addendum)
Your blood pressure looks fine on current dose of amlodipine INR is in the correct range, continue your current dose of Coumadin I am going to try to get you set up with a home INR machine through a program called [facility].  However, if we cannot get this done in the next month please come in for an INR in about 4 weeks.  Otherwise please see me in 4 to 5 months  I am so glad that you are no longer taking Ambien!  Please follow-up with Dr. [name] as scheduled Let me know if I can do anything to help  Please schedule your Medicare wellness exam on the way out, perhaps you can have this done and visit with me at the same time

## 2018-04-26 ENCOUNTER — Telehealth: Payer: Self-pay | Admitting: *Deleted

## 2018-04-26 NOTE — Telephone Encounter (Signed)
Received Home Health Certification and Plan of Care; forwarded to provider/SLS 02/21

## 2018-04-26 NOTE — Telephone Encounter (Signed)
Verbal orders given  

## 2018-04-29 DIAGNOSIS — G20 Parkinson's disease: Secondary | ICD-10-CM | POA: Diagnosis not present

## 2018-04-29 DIAGNOSIS — G249 Dystonia, unspecified: Secondary | ICD-10-CM | POA: Diagnosis not present

## 2018-04-29 DIAGNOSIS — F0280 Dementia in other diseases classified elsewhere without behavioral disturbance: Secondary | ICD-10-CM | POA: Diagnosis not present

## 2018-04-29 DIAGNOSIS — M0510 Rheumatoid lung disease with rheumatoid arthritis of unspecified site: Secondary | ICD-10-CM | POA: Diagnosis not present

## 2018-04-29 DIAGNOSIS — M47812 Spondylosis without myelopathy or radiculopathy, cervical region: Secondary | ICD-10-CM | POA: Diagnosis not present

## 2018-04-29 DIAGNOSIS — R2689 Other abnormalities of gait and mobility: Secondary | ICD-10-CM | POA: Diagnosis not present

## 2018-04-30 DIAGNOSIS — M0510 Rheumatoid lung disease with rheumatoid arthritis of unspecified site: Secondary | ICD-10-CM | POA: Diagnosis not present

## 2018-04-30 DIAGNOSIS — F0280 Dementia in other diseases classified elsewhere without behavioral disturbance: Secondary | ICD-10-CM | POA: Diagnosis not present

## 2018-04-30 DIAGNOSIS — M47812 Spondylosis without myelopathy or radiculopathy, cervical region: Secondary | ICD-10-CM | POA: Diagnosis not present

## 2018-04-30 DIAGNOSIS — G20 Parkinson's disease: Secondary | ICD-10-CM | POA: Diagnosis not present

## 2018-04-30 DIAGNOSIS — R2689 Other abnormalities of gait and mobility: Secondary | ICD-10-CM | POA: Diagnosis not present

## 2018-04-30 DIAGNOSIS — G249 Dystonia, unspecified: Secondary | ICD-10-CM | POA: Diagnosis not present

## 2018-05-02 ENCOUNTER — Telehealth: Payer: Self-pay

## 2018-05-02 NOTE — Telephone Encounter (Signed)
Copied from [place] [id number]. Topic: Quick Communication - See Telephone Encounter >> Apr 29, 2018 10:29 AM Rayann Heman wrote: CRM for notification. See Telephone encounter for: 04/29/18. Clint calling from [facility]  and stated that he needs demographics and insurance information so that he can get home INR order in. Please advise [phone]

## 2018-05-02 NOTE — Telephone Encounter (Signed)
Taken care of

## 2018-05-06 ENCOUNTER — Other Ambulatory Visit: Payer: Self-pay | Admitting: Family Medicine

## 2018-05-06 ENCOUNTER — Telehealth: Payer: Self-pay | Admitting: Family Medicine

## 2018-05-06 DIAGNOSIS — R2689 Other abnormalities of gait and mobility: Secondary | ICD-10-CM | POA: Diagnosis not present

## 2018-05-06 DIAGNOSIS — G249 Dystonia, unspecified: Secondary | ICD-10-CM | POA: Diagnosis not present

## 2018-05-06 DIAGNOSIS — G20 Parkinson's disease: Secondary | ICD-10-CM | POA: Diagnosis not present

## 2018-05-06 DIAGNOSIS — M47812 Spondylosis without myelopathy or radiculopathy, cervical region: Secondary | ICD-10-CM | POA: Diagnosis not present

## 2018-05-06 DIAGNOSIS — F0280 Dementia in other diseases classified elsewhere without behavioral disturbance: Secondary | ICD-10-CM | POA: Diagnosis not present

## 2018-05-06 DIAGNOSIS — I10 Essential (primary) hypertension: Secondary | ICD-10-CM

## 2018-05-06 DIAGNOSIS — E039 Hypothyroidism, unspecified: Secondary | ICD-10-CM

## 2018-05-06 DIAGNOSIS — M0510 Rheumatoid lung disease with rheumatoid arthritis of unspecified site: Secondary | ICD-10-CM | POA: Diagnosis not present

## 2018-05-06 NOTE — Telephone Encounter (Signed)
Copied from [place] [id number]. Topic: Quick Communication - Home Health Verbal Orders >> May 06, 2018 11:57 AM Bea Graff, NT wrote: Caller/AgencyConstance Haw, [facility] Callback Number: [phone] Requesting OT/PT/Skilled Nursing/Social Work: PT Frequency: 2 times a week for 3 weeks, 1 time a week for 1 week

## 2018-05-07 NOTE — Telephone Encounter (Signed)
Verbal orders given  

## 2018-05-08 ENCOUNTER — Telehealth: Payer: Self-pay | Admitting: *Deleted

## 2018-05-08 ENCOUNTER — Telehealth: Payer: Self-pay | Admitting: Neurology

## 2018-05-08 DIAGNOSIS — G249 Dystonia, unspecified: Secondary | ICD-10-CM | POA: Diagnosis not present

## 2018-05-08 DIAGNOSIS — R2689 Other abnormalities of gait and mobility: Secondary | ICD-10-CM | POA: Diagnosis not present

## 2018-05-08 DIAGNOSIS — M47812 Spondylosis without myelopathy or radiculopathy, cervical region: Secondary | ICD-10-CM | POA: Diagnosis not present

## 2018-05-08 DIAGNOSIS — F0280 Dementia in other diseases classified elsewhere without behavioral disturbance: Secondary | ICD-10-CM | POA: Diagnosis not present

## 2018-05-08 DIAGNOSIS — G20 Parkinson's disease: Secondary | ICD-10-CM | POA: Diagnosis not present

## 2018-05-08 DIAGNOSIS — M0510 Rheumatoid lung disease with rheumatoid arthritis of unspecified site: Secondary | ICD-10-CM | POA: Diagnosis not present

## 2018-05-08 NOTE — Telephone Encounter (Signed)
Received phone call (voicemail) that patient is now using [place]. This has been changed in the system.

## 2018-05-08 NOTE — Telephone Encounter (Signed)
Received Physician Orders from [facility] [place]; forwarded to provider/SLS 03/04

## 2018-05-09 ENCOUNTER — Other Ambulatory Visit: Payer: Self-pay | Admitting: Neurology

## 2018-05-09 MED ORDER — CARBIDOPA-LEVODOPA 25-100 MG PO TABS: ORAL_TABLET | ORAL | 1 refills | Status: DC

## 2018-05-15 ENCOUNTER — Other Ambulatory Visit: Payer: Self-pay | Admitting: Family Medicine

## 2018-05-15 DIAGNOSIS — G20 Parkinson's disease: Secondary | ICD-10-CM

## 2018-05-15 DIAGNOSIS — M47812 Spondylosis without myelopathy or radiculopathy, cervical region: Secondary | ICD-10-CM | POA: Diagnosis not present

## 2018-05-15 DIAGNOSIS — M0510 Rheumatoid lung disease with rheumatoid arthritis of unspecified site: Secondary | ICD-10-CM | POA: Diagnosis not present

## 2018-05-15 DIAGNOSIS — F0280 Dementia in other diseases classified elsewhere without behavioral disturbance: Secondary | ICD-10-CM | POA: Diagnosis not present

## 2018-05-15 DIAGNOSIS — E039 Hypothyroidism, unspecified: Secondary | ICD-10-CM

## 2018-05-15 DIAGNOSIS — R2689 Other abnormalities of gait and mobility: Secondary | ICD-10-CM | POA: Diagnosis not present

## 2018-05-15 DIAGNOSIS — G249 Dystonia, unspecified: Secondary | ICD-10-CM | POA: Diagnosis not present

## 2018-05-15 DIAGNOSIS — I10 Essential (primary) hypertension: Secondary | ICD-10-CM

## 2018-05-15 MED ORDER — LEVOTHYROXINE SODIUM 125 MCG PO TABS: 125.0000 ug | ORAL_TABLET | Freq: Every day | ORAL | 5 refills | Status: DC

## 2018-05-15 MED ORDER — AMLODIPINE BESYLATE 2.5 MG PO TABS: 2.5000 mg | ORAL_TABLET | Freq: Every day | ORAL | 1 refills | Status: DC

## 2018-05-15 NOTE — Telephone Encounter (Signed)
Copied from [place] [id number]. Topic: Quick Communication - Rx Refill/Question >> May 15, 2018  9:14 AM Sheppard Coil, [employer] L wrote: Medication:  venlafaxine XR (EFFEXOR-XR) 150 MG 24 hr capsule amLODipine (NORVASC) 2.5 MG tablet  atorvastatin (LIPITOR) 10 MG tablet magnesium oxide (MAG-OX) 400 (241.3 Mg) MG tablet warfarin (COUMADIN) 4 MG tablet levothyroxine (SYNTHROID, LEVOTHROID) 125 MCG tablet  Has the patient contacted their pharmacy? Yes - pharmacy calling.  States they sent a fax on 05/10/2018 and haven't heard a response (Agent: If no, request that the patient contact the pharmacy for the refill.) (Agent: If yes, when and what did the pharmacy advise?)  Preferred Pharmacy (with phone number or street name): [facility], [place] [phone] (Phone) [phone] (Fax)  Agent: Please be advised that RX refills may take up to 3 business days. We ask that you follow-up with your pharmacy.

## 2018-05-15 NOTE — Telephone Encounter (Signed)
Routed incorrectly,

## 2018-05-15 NOTE — Telephone Encounter (Signed)
Requested medication (s) are due for refill today - patient is changing pharmacy  Requested medication (s) are on the active medication list -yes  Future visit scheduled -yes  Last refill and notes to clinic:  Venlafaxine XR - (lab protocol fail)  Last filled 11/01/17 3 RF                                                 Warfarin- (non delegated) Last filled 11/08/17                                                 Mag OX - (outside provider) Last filled 12/21/15                                                 Atorvastatin- ( lab protocol fail) Last filled 05/06/18 Patient is changing pharmacy- she needs her prescriptions forwarded- some do not pass protocol- or are non delegated- sent for PCP review. Patient is up to date with her appointments    Requested Prescriptions  Pending Prescriptions Disp Refills   venlafaxine XR (EFFEXOR-XR) 150 MG 24 hr capsule 90 capsule 3    Sig: Take 1 capsule (150 mg total) by mouth daily with breakfast.     Psychiatry: Antidepressants - SNRI - desvenlafaxine & venlafaxine Failed - 05/15/2018  9:33 AM      Failed - LDL in normal range and within 360 days    LDL Cholesterol  Date Value Ref Range Status  10/26/2016 79 0 - 99 mg/dL Final         Failed - Total Cholesterol in normal range and within 360 days    Cholesterol  Date Value Ref Range Status  10/26/2016 161 0 - 200 mg/dL Final    Comment:    ATP III Classification       Desirable:  < 200 mg/dL               Borderline High:  200 - 239 mg/dL          High:  > = 240 mg/dL         Failed - Triglycerides in normal range and within 360 days    Triglycerides  Date Value Ref Range Status  10/26/2016 145.0 0.0 - 149.0 mg/dL Final    Comment:    Normal:  <150 mg/dLBorderline High:  150 - 199 mg/dL         Passed - Last BP in normal range    BP Readings from Last 1 Encounters:  04/25/18 122/80         Passed - Valid encounter within last 6 months    Recent Outpatient Visits          2 weeks ago  Anticoagulant long-term use   Archivist at [facility], [name], MD   4 months ago Medication monitoring Loss adjuster, chartered at [place], [name], MD   5 months ago Need for immunization against influenza   [name], [name], MD   6 months ago Hospital discharge follow-up   [facility] [place], [place]   7 months ago Urinary tract infection with hematuria, site unspecified   Archivist at [employer], FNP      Future Appointments            In 1 week Dennis Bast, RN [employer] at [employer], [place]   In 3 months [name], [name], MD [employer] at [employer] [facility], [facility]          warfarin (COUMADIN) 4 MG tablet 90 tablet 1    Sig: Take 1 tablet (4 mg total) by mouth daily. Adjust as directed by MD     Hematology:  Anticoagulants - warfarin Failed - 05/15/2018  9:33 AM      Failed - This refill cannot be delegated      Failed - If the patient is managed by Coumadin Clinic - route to their Pool. If not, forward to the provider.      Passed - INR in normal range and within 30 days    INR  Date Value Ref Range Status  04/25/2018 2.2 2.0 - 3.0 Final  03/04/2018 1.6 (A) 0.9 - 1.1 Final  02/14/2011 2.70 2.00 - 3.50 Final    Comment:    INR is useful only to assess adequacy of anticoagulation with coumadin when comparing results from different labs. It should not be used to estimate bleeding risk or presence/abscense of coagulopathy in patients not on coumadin. Expected INR ranges for  nontherapeutic patients is 0.88 - 1.12.         Passed - Valid encounter within last 3 months    Recent Outpatient Visits          2 weeks ago Anticoagulant long-term use   Archivist at [employer], MD   4 months ago Medication monitoring Loss adjuster, chartered at [place], MD   5 months ago Need for immunization against influenza   Archivist at [place], MD   6 months ago Hospital discharge follow-up   [facility] at [place], MD   7 months ago Urinary tract infection with hematuria, site unspecified   Archivist at [place], FNP      Future Appointments            In 1 week Vevelyn Royals, Parthenia Ames, [place] at [employer] [facility], [place]   In 3 months [name], [name], MD [place] at [employer] [facility], [facility]          magnesium oxide (MAG-OX) 400 (241.3 Mg) MG tablet 30 tablet 0    Sig: Take 0.5 tablets (200 mg total) by mouth daily.     Endocrinology:  Minerals - Magnesium Supplementation Failed - 05/15/2018  9:33 AM      Failed - Mg Level in normal range and within 360 days    Magnesium  Date Value Ref Range Status  11/23/2016 2.0 1.7 - 2.4 mg/dL Final         Passed - Valid encounter within last 12 months    Recent Outpatient Visits  2 weeks ago Anticoagulant long-term use   Archivist at [place], MD   4 months ago Medication monitoring Loss adjuster, chartered at [place], MD   5 months ago Need for immunization against influenza   Archivist at [place], MD   6 months ago Hospital discharge follow-up   [facility] at [place], MD   7 months ago Urinary tract infection with hematuria, site unspecified   Archivist at [place], FNP      Future Appointments             In 1 week Dennis Bast, RN [employer] at [employer], [place]   In 3 months [name], [name], MD [place] at [employer] [facility], [facility]          atorvastatin (LIPITOR) 10 MG tablet 90 tablet 1    Sig: Take 1 tablet (10 mg total) by mouth daily.     Cardiovascular:  Antilipid - Statins Failed - 05/15/2018  9:33 AM      Failed - Total Cholesterol in normal range and within 360 days    Cholesterol  Date Value Ref Range Status  10/26/2016 161 0 - 200 mg/dL Final    Comment:    ATP III Classification       Desirable:  < 200 mg/dL               Borderline High:  200 - 239 mg/dL          High:  > = 240 mg/dL         Failed - LDL in normal range and within 360 days    LDL Cholesterol  Date Value Ref Range Status  10/26/2016 79 0 - 99 mg/dL Final         Failed - HDL in normal range and within 360 days    HDL  Date Value Ref Range Status  10/26/2016 53.10 >39.00 mg/dL Final         Failed - Triglycerides in normal range and within 360 days    Triglycerides  Date Value Ref Range Status  10/26/2016 145.0 0.0 - 149.0 mg/dL Final    Comment:    Normal:  <150 mg/dLBorderline High:  150 - 199 mg/dL         Passed - Patient is not pregnant      Passed - Valid encounter within last 12 months    Recent Outpatient Visits          2 weeks ago Anticoagulant long-term use   Archivist at [employer], [name], MD   4 months ago Medication monitoring Loss adjuster, chartered at [place], [name], MD   5 months ago Need for immunization against influenza   Archivist at [place], [name], MD   6 months ago Hospital discharge follow-up   [facility] at [place], MD   7 months ago Urinary tract infection with hematuria, site unspecified   Archivist  at [employer] [facility] [place], Jacquelynn Cree, [place]      Future Appointments  In 1 week Dennis Bast, RN [employer] at [employer], [place]   In 3 months [name], [name], MD [employer] at [employer], [place]         Signed Prescriptions Disp Refills   levothyroxine (SYNTHROID, LEVOTHROID) 125 MCG tablet 30 tablet 5    Sig: Take 1 tablet (125 mcg total) by mouth daily.     Endocrinology:  Hypothyroid Agents Failed - 05/15/2018  9:33 AM      Failed - TSH needs to be rechecked within 3 months after an abnormal result. Refill until TSH is due.      Passed - TSH in normal range and within 360 days    TSH  Date Value Ref Range Status  02/20/2018 0.60 0.35 - 4.50 uIU/mL Final         Passed - Valid encounter within last 12 months    Recent Outpatient Visits          2 weeks ago Anticoagulant long-term use   Archivist at [place], MD   4 months ago Medication monitoring Loss adjuster, chartered at [place], MD   5 months ago Need for immunization against influenza   Archivist at [place], MD   6 months ago Hospital discharge follow-up   [facility] at [place], MD   7 months ago Urinary tract infection with hematuria, site unspecified   Archivist at [place], FNP      Future Appointments            In 1 week Vevelyn Royals, Parthenia Ames, [place] at [employer] [facility], [place]   In 3 months [name], [name], MD [place] at [employer], [facility]          amLODipine (NORVASC) 2.5 MG tablet 90 tablet 1    Sig: Take 1 tablet (2.5 mg total) by mouth daily.     Cardiovascular:  Calcium Channel Blockers Passed - 05/15/2018  9:33 AM      Passed  - Last BP in normal range    BP Readings from Last 1 Encounters:  04/25/18 122/80         Passed - Valid encounter within last 6 months    Recent Outpatient Visits          2 weeks ago Anticoagulant long-term use   Archivist at [employer], [name], MD   4 months ago Medication monitoring Loss adjuster, chartered at [place], MD   5 months ago Need for immunization against influenza   Archivist at [place], MD   6 months ago Hospital discharge follow-up   [facility] at [place], MD   7 months ago Urinary tract infection with hematuria, site unspecified   Archivist at [place], FNP      Future Appointments            In 1 week Vevelyn Royals, Parthenia Ames, [place] at [employer], [place]   In 3 months [name], [name], MD [employer], [facility]            Requested Prescriptions  Pending Prescriptions Disp Refills   venlafaxine XR (EFFEXOR-XR) 150 MG 24 hr capsule 90 capsule 3    Sig: Take 1 capsule (150 mg total) by mouth daily with breakfast.     Psychiatry: Antidepressants - SNRI - desvenlafaxine & venlafaxine Failed - 05/15/2018  9:33 AM      Failed - LDL in normal range and within 360 days    LDL Cholesterol  Date Value Ref Range Status  10/26/2016 79 0 - 99 mg/dL Final         Failed - Total Cholesterol in normal range and within 360 days    Cholesterol  Date Value Ref Range Status  10/26/2016 161 0 - 200 mg/dL Final    Comment:    ATP III Classification       Desirable:  < 200 mg/dL               Borderline High:  200 - 239 mg/dL          High:  > = 240 mg/dL         Failed - Triglycerides in normal range and within 360 days    Triglycerides  Date Value Ref Range Status   10/26/2016 145.0 0.0 - 149.0 mg/dL Final    Comment:    Normal:  <150 mg/dLBorderline High:  150 - 199 mg/dL         Passed - Last BP in normal range    BP Readings from Last 1 Encounters:  04/25/18 122/80         Passed - Valid encounter within last 6 months    Recent Outpatient Visits          2 weeks ago Anticoagulant long-term use   Archivist at [employer], [name], MD   4 months ago Medication monitoring encounter   Archivist at [place], [name], MD   5 months ago Need for immunization against influenza   Archivist at [place], [name], MD   6 months ago Hospital discharge follow-up   [place] at [place], [name], MD   7 months ago Urinary tract infection with hematuria, site unspecified   Archivist at [place], FNP      Future Appointments            In 1 week Vevelyn Royals, Parthenia Ames, RN [employer] at [employer], [place]   In 3 months [name], [name], MD [place] at [employer] [facility], [facility]          warfarin (COUMADIN) 4 MG tablet 90 tablet 1    Sig: Take 1 tablet (4 mg total) by mouth daily. Adjust as directed by MD     Hematology:  Anticoagulants - warfarin Failed - 05/15/2018  9:33 AM      Failed - This refill cannot be delegated      Failed - If the patient is managed by Coumadin Clinic - route to their Pool. If not, forward to the provider.      Passed - INR in normal range and within 30 days    INR  Date Value Ref Range Status  04/25/2018 2.2 2.0 - 3.0 Final  03/04/2018 1.6 (A) 0.9 -  1.1 Final  02/14/2011 2.70 2.00 - 3.50 Final    Comment:    INR is useful only to assess adequacy of anticoagulation with coumadin when comparing results from different labs. It should not be used to estimate bleeding  risk or presence/abscense of coagulopathy in patients not on coumadin. Expected INR ranges for  nontherapeutic patients is 0.88 - 1.12.         Passed - Valid encounter within last 3 months    Recent Outpatient Visits          2 weeks ago Anticoagulant long-term use   Archivist at [place], MD   4 months ago Medication monitoring Loss adjuster, chartered at [place], MD   5 months ago Need for immunization against influenza   Archivist at [place], MD   6 months ago Hospital discharge follow-up   [facility] at [place], MD   7 months ago Urinary tract infection with hematuria, site unspecified   Archivist at [place], FNP      Future Appointments            In 1 week Vevelyn Royals, Parthenia Ames, [place] at [employer], [place]   In 3 months [name], [name], MD [place] at [employer] [facility], [facility]          magnesium oxide (MAG-OX) 400 (241.3 Mg) MG tablet 30 tablet 0    Sig: Take 0.5 tablets (200 mg total) by mouth daily.     Endocrinology:  Minerals - Magnesium Supplementation Failed - 05/15/2018  9:33 AM      Failed - Mg Level in normal range and within 360 days    Magnesium  Date Value Ref Range Status  11/23/2016 2.0 1.7 - 2.4 mg/dL Final         Passed - Valid encounter within last 12 months    Recent Outpatient Visits          2 weeks ago Anticoagulant long-term use   Archivist at [place], MD   4 months ago Medication monitoring Loss adjuster, chartered at [place], MD   5 months ago Need for immunization against influenza   Archivist at [place], MD   6 months ago Hospital discharge follow-up   [facility] at [place], MD   7 months ago Urinary tract infection with hematuria, site unspecified   Archivist at [place], FNP      Future Appointments            In 1 week Vevelyn Royals, Parthenia Ames, [place] at [employer] [facility], [place]   In 3 months [name], [name], MD [place] at [employer] [facility], [facility]          atorvastatin (LIPITOR) 10 MG tablet 90 tablet 1    Sig: Take 1 tablet (10 mg total) by mouth daily.     Cardiovascular:  Antilipid - Statins Failed - 05/15/2018  9:33 AM      Failed - Total Cholesterol in normal range and within  360 days    Cholesterol  Date Value Ref Range Status  10/26/2016 161 0 - 200 mg/dL Final    Comment:    ATP III Classification       Desirable:  < 200 mg/dL               Borderline High:  200 - 239 mg/dL          High:  > = 240 mg/dL         Failed - LDL in normal range and within 360 days    LDL Cholesterol  Date Value Ref Range Status  10/26/2016 79 0 - 99 mg/dL Final         Failed - HDL in normal range and within 360 days    HDL  Date Value Ref Range Status  10/26/2016 53.10 >39.00 mg/dL Final         Failed - Triglycerides in normal range and within 360 days    Triglycerides  Date Value Ref Range Status  10/26/2016 145.0 0.0 - 149.0 mg/dL Final    Comment:    Normal:  <150 mg/dLBorderline High:  150 - 199 mg/dL         Passed - Patient is not pregnant      Passed - Valid encounter within last 12 months    Recent Outpatient Visits          2 weeks ago Anticoagulant long-term use   Archivist at [employer], [name], MD   4 months ago Medication monitoring encounter   Archivist at [place], [name], MD   5 months ago Need for immunization  against influenza   Archivist at [place], [name], MD   6 months ago Hospital discharge follow-up   [place] [facility] [place], [name], MD   7 months ago Urinary tract infection with hematuria, site unspecified   Archivist at [place], FNP      Future Appointments            In 1 week Vevelyn Royals, Parthenia Ames, [place] at [employer], [place]   In 3 months [name], [name], MD [employer] at [employer], [place]         Signed Prescriptions Disp Refills   levothyroxine (SYNTHROID, LEVOTHROID) 125 MCG tablet 30 tablet 5    Sig: Take 1 tablet (125 mcg total) by mouth daily.     Endocrinology:  Hypothyroid Agents Failed - 05/15/2018  9:33 AM      Failed - TSH needs to be rechecked within 3 months after an abnormal result. Refill until TSH is due.      Passed - TSH in normal range and within 360 days    TSH  Date Value Ref Range Status  02/20/2018 0.60 0.35 - 4.50 uIU/mL Final         Passed - Valid encounter within last 12 months    Recent Outpatient Visits          2 weeks ago Anticoagulant long-term use   Archivist at [place], MD   4 months ago Medication monitoring Loss adjuster, chartered at [place], MD   5 months ago Need for immunization against influenza  Archivist at [place], MD   6 months ago Hospital discharge follow-up   [facility] at [place], MD   7 months ago Urinary tract infection with hematuria, site unspecified   Archivist at [place], FNP      Future Appointments            In 1 week Dennis Bast, RN [employer] at  [employer], [place]   In 3 months [name], [name], MD [place] at [employer] [facility], [facility]          amLODipine (NORVASC) 2.5 MG tablet 90 tablet 1    Sig: Take 1 tablet (2.5 mg total) by mouth daily.     Cardiovascular:  Calcium Channel Blockers Passed - 05/15/2018  9:33 AM      Passed - Last BP in normal range    BP Readings from Last 1 Encounters:  04/25/18 122/80         Passed - Valid encounter within last 6 months    Recent Outpatient Visits          2 weeks ago Anticoagulant long-term use   Archivist at [employer], [name], MD   4 months ago Medication monitoring Loss adjuster, chartered at [place], MD   5 months ago Need for immunization against influenza   Archivist at [place], MD   6 months ago Hospital discharge follow-up   [facility] at [place], MD   7 months ago Urinary tract infection with hematuria, site unspecified   Archivist at [place], FNP      Future Appointments            In 1 week Vevelyn Royals, Parthenia Ames, [place] at [employer] [facility], [place]   In 3 months [name], [name], MD [employer] at [employer], [place]

## 2018-05-17 ENCOUNTER — Telehealth: Payer: Self-pay | Admitting: Family Medicine

## 2018-05-17 MED ORDER — VENLAFAXINE HCL ER 150 MG PO CP24: 150.0000 mg | ORAL_CAPSULE | Freq: Every day | ORAL | 1 refills | Status: DC

## 2018-05-17 MED ORDER — MAGNESIUM OXIDE 400 (241.3 MG) MG PO TABS: 200.0000 mg | ORAL_TABLET | Freq: Every day | ORAL | 1 refills | Status: DC

## 2018-05-17 MED ORDER — WARFARIN SODIUM 4 MG PO TABS: ORAL_TABLET | ORAL | 1 refills | Status: DC

## 2018-05-17 NOTE — Telephone Encounter (Signed)
Copied from [place] [phone]. Topic: Quick Communication - Home Health Verbal Orders >> May 17, 2018  3:20 PM Scherrie Gerlach wrote: Caller/Agency: [facility] Callback Number: [phone] There is a missed PT visit for today.  Unable to reach pt and she will not answer her phone

## 2018-05-18 DIAGNOSIS — Z86711 Personal history of pulmonary embolism: Secondary | ICD-10-CM | POA: Diagnosis not present

## 2018-05-18 DIAGNOSIS — F330 Major depressive disorder, recurrent, mild: Secondary | ICD-10-CM | POA: Diagnosis not present

## 2018-05-18 DIAGNOSIS — G249 Dystonia, unspecified: Secondary | ICD-10-CM | POA: Diagnosis not present

## 2018-05-18 DIAGNOSIS — G20 Parkinson's disease: Secondary | ICD-10-CM | POA: Diagnosis not present

## 2018-05-18 DIAGNOSIS — F0280 Dementia in other diseases classified elsewhere without behavioral disturbance: Secondary | ICD-10-CM | POA: Diagnosis not present

## 2018-05-18 DIAGNOSIS — Z7901 Long term (current) use of anticoagulants: Secondary | ICD-10-CM | POA: Diagnosis not present

## 2018-05-18 DIAGNOSIS — Z9181 History of falling: Secondary | ICD-10-CM | POA: Diagnosis not present

## 2018-05-18 DIAGNOSIS — M47812 Spondylosis without myelopathy or radiculopathy, cervical region: Secondary | ICD-10-CM | POA: Diagnosis not present

## 2018-05-18 DIAGNOSIS — Z96651 Presence of right artificial knee joint: Secondary | ICD-10-CM | POA: Diagnosis not present

## 2018-05-18 DIAGNOSIS — I10 Essential (primary) hypertension: Secondary | ICD-10-CM | POA: Diagnosis not present

## 2018-05-18 DIAGNOSIS — R2689 Other abnormalities of gait and mobility: Secondary | ICD-10-CM | POA: Diagnosis not present

## 2018-05-18 DIAGNOSIS — M0510 Rheumatoid lung disease with rheumatoid arthritis of unspecified site: Secondary | ICD-10-CM | POA: Diagnosis not present

## 2018-05-20 DIAGNOSIS — G249 Dystonia, unspecified: Secondary | ICD-10-CM | POA: Diagnosis not present

## 2018-05-20 DIAGNOSIS — M47812 Spondylosis without myelopathy or radiculopathy, cervical region: Secondary | ICD-10-CM | POA: Diagnosis not present

## 2018-05-20 DIAGNOSIS — F0280 Dementia in other diseases classified elsewhere without behavioral disturbance: Secondary | ICD-10-CM | POA: Diagnosis not present

## 2018-05-20 DIAGNOSIS — R2689 Other abnormalities of gait and mobility: Secondary | ICD-10-CM | POA: Diagnosis not present

## 2018-05-20 DIAGNOSIS — G20 Parkinson's disease: Secondary | ICD-10-CM | POA: Diagnosis not present

## 2018-05-20 DIAGNOSIS — M0510 Rheumatoid lung disease with rheumatoid arthritis of unspecified site: Secondary | ICD-10-CM | POA: Diagnosis not present

## 2018-05-24 ENCOUNTER — Ambulatory Visit: Payer: Medicare Other | Admitting: *Deleted

## 2018-05-30 ENCOUNTER — Telehealth: Payer: Self-pay | Admitting: *Deleted

## 2018-05-30 NOTE — Telephone Encounter (Signed)
Received Missed Visit Report from [facility]; forwarded to provider/SLS 03/26

## 2018-05-31 ENCOUNTER — Encounter: Payer: Self-pay | Admitting: Neurology

## 2018-05-31 NOTE — Progress Notes (Signed)
Botox approval valid through 1/03/07/2019. Patient to use buy and bill.

## 2018-06-06 ENCOUNTER — Telehealth: Payer: Self-pay | Admitting: *Deleted

## 2018-06-06 DIAGNOSIS — M47812 Spondylosis without myelopathy or radiculopathy, cervical region: Secondary | ICD-10-CM | POA: Diagnosis not present

## 2018-06-06 DIAGNOSIS — M0510 Rheumatoid lung disease with rheumatoid arthritis of unspecified site: Secondary | ICD-10-CM | POA: Diagnosis not present

## 2018-06-06 DIAGNOSIS — R2689 Other abnormalities of gait and mobility: Secondary | ICD-10-CM | POA: Diagnosis not present

## 2018-06-06 DIAGNOSIS — G249 Dystonia, unspecified: Secondary | ICD-10-CM | POA: Diagnosis not present

## 2018-06-06 DIAGNOSIS — F0280 Dementia in other diseases classified elsewhere without behavioral disturbance: Secondary | ICD-10-CM | POA: Diagnosis not present

## 2018-06-06 DIAGNOSIS — G20 Parkinson's disease: Secondary | ICD-10-CM | POA: Diagnosis not present

## 2018-06-06 NOTE — Telephone Encounter (Signed)
Received PT/INR results from [facility]; forwarded to provider/SLS 04/02

## 2018-06-10 ENCOUNTER — Telehealth: Payer: Self-pay | Admitting: *Deleted

## 2018-06-10 NOTE — Telephone Encounter (Signed)
Received Home Health Discharge-Transfer Summary for review from [facility]; forwarded to provider/SLS 04/06

## 2018-06-11 LAB — PROTIME-INR

## 2018-06-12 ENCOUNTER — Telehealth: Payer: Self-pay | Admitting: *Deleted

## 2018-06-12 NOTE — Telephone Encounter (Signed)
Received PT/INR results from [facility]; forwarded to provider/SLS 04/08

## 2018-06-13 ENCOUNTER — Telehealth: Payer: Self-pay | Admitting: *Deleted

## 2018-06-13 ENCOUNTER — Telehealth: Payer: Self-pay

## 2018-06-13 ENCOUNTER — Encounter: Payer: Self-pay | Admitting: Family Medicine

## 2018-06-13 NOTE — Telephone Encounter (Signed)
Called patient to discuss her INR Received report from MD INR, value on 4/6 was 1.8 Previous 2 checks were both 2.5 She notes that she forgot to take a dose last week She is taking 4 mg daily She will recheck on Saturday- wishes to see how her INR looks prior to doing a med adjustment She will let me know how her INR looks over mychart when she does her Saturday check.  If out of range I will adjust for her

## 2018-06-13 NOTE — Telephone Encounter (Signed)
Patient called in with INR result. States it was 1.8 on 06/08/18. Patient states she may have missed 1 pill. Says her last 2 results before that was 2.5 and 2.5. Advised I would forward information to Dr. Lorelei Pont.

## 2018-06-13 NOTE — Telephone Encounter (Signed)
Received PT/INR results from [facility] PT/INR Self Testing Service; forwarded to provider/SLS 04/09

## 2018-06-20 ENCOUNTER — Ambulatory Visit (INDEPENDENT_AMBULATORY_CARE_PROVIDER_SITE_OTHER): Payer: Medicare Other

## 2018-06-20 ENCOUNTER — Other Ambulatory Visit: Payer: Self-pay

## 2018-06-20 DIAGNOSIS — Z7901 Long term (current) use of anticoagulants: Secondary | ICD-10-CM

## 2018-06-20 LAB — POCT INR: INR: 2.5 (ref 2.0–3.0)

## 2018-06-20 NOTE — Progress Notes (Addendum)
Pre visit review using our clinic tool,if applicable. No additional management support is needed unless otherwise documented below in the visit note.   Pt here for INR check per order from Dr. Lenna Sciara. [name].  Goal INR = 2.0-3.0 Last INR = 1.8  Pt currently takes Coumadin 4 mg daily.  Pt denies recent antibiotics, no dietary changes and no unusual bruising / bleeding.  INR today = 2.5  Pt advised per  Dr. Lorelei Pont to continue Coumadin 4 mg daily, Report next INR.   Reviewed and agree- Dennie Bible MD

## 2018-06-29 DIAGNOSIS — Z86718 Personal history of other venous thrombosis and embolism: Secondary | ICD-10-CM | POA: Diagnosis not present

## 2018-06-29 DIAGNOSIS — Z7901 Long term (current) use of anticoagulants: Secondary | ICD-10-CM | POA: Diagnosis not present

## 2018-07-05 ENCOUNTER — Ambulatory Visit: Payer: Medicare Other | Admitting: Neurology

## 2018-07-05 ENCOUNTER — Telehealth: Payer: Self-pay | Admitting: *Deleted

## 2018-07-05 NOTE — Telephone Encounter (Signed)
Received PT/INR results from [facility]; forwarded to provider/SLS 05/01

## 2018-07-08 NOTE — Progress Notes (Deleted)
Virtual Visit via Video Note  I connected with patient on 07/09/18 at  1:00 PM EDT by a video enabled telemedicine application and verified that I am speaking with the correct person using two identifiers.   THIS ENCOUNTER IS A VIRTUAL VISIT DUE TO COVID-19 - PATIENT WAS NOT SEEN IN THE OFFICE. PATIENT HAS CONSENTED TO VIRTUAL VISIT / TELEMEDICINE VISIT   Location of patient: home  Location of provider: office  I discussed the limitations of evaluation and management by telemedicine and the availability of in person appointments. The patient expressed understanding and agreed to proceed.   Subjective:   [name] [name] is a 73 y.o. female who presents for an Initial Medicare Annual Wellness Visit.  Review of Systems   No ROS.  Medicare Wellness Virtual Visit. UTA vital signs.  Additional risk factors are reflected in the social history.    Sleep patterns: Home Safety/Smoke Alarms: Feels safe in home. Smoke alarms in place.     Female:    Mammo- 12/13/17       Dexa scan- 12/13/17       [facility]-    Objective:     Unable to assess vitals. This visit is enabled though telemedicine due to Covid 19.   Advanced Directives 02/11/2018 12/09/2017 10/13/2017 10/13/2017 02/12/2017 02/11/2017 12/07/2016  Does Patient Have a Medical Advance Directive? Yes Yes Yes Yes Yes No No  Type of Advance Directive - Healthcare Power of [place];Living will Out of facility DNR (pink MOST or yellow form) [place] of facility DNR (pink MOST or yellow form)  Does patient want to make changes to medical advance directive? No - Patient declined No - Patient declined No - Patient declined - No - Patient declined - -  Copy of [place] in Chart? - - No - copy requested - No - copy requested - -  Would patient like information on creating a medical advance directive? - - - - - - -    Current Medications (verified) Outpatient Encounter  Medications as of 07/09/2018  Medication Sig  . acetaminophen (TYLENOL) 500 MG tablet Take 500 mg by mouth every 6 (six) hours as needed for mild pain.  Marland Kitchen amLODipine (NORVASC) 2.5 MG tablet Take 1 tablet (2.5 mg total) by mouth daily.  Marland Kitchen atorvastatin (LIPITOR) 10 MG tablet Take 1 tablet by mouth once daily  . carbidopa-levodopa (SINEMET IR) 25-100 MG tablet 2 in the morning, 1 in the afternoon, 1 in the evening  . cephALEXin (KEFLEX) 250 MG capsule Take 250 mg by mouth daily.  . Cholecalciferol (VITAMIN D3) 5000 UNITS CAPS Take 5,000 Units by mouth daily.   . cyanocobalamin 500 MCG tablet Take 500 mcg by mouth daily.  Marland Kitchen ketoconazole (NIZORAL) 2 % shampoo Apply 1 application topically 2 (two) times a week.  . levothyroxine (SYNTHROID, LEVOTHROID) 125 MCG tablet Take 1 tablet (125 mcg total) by mouth daily.  . magnesium oxide (MAG-OX) 400 (241.3 Mg) MG tablet Take 0.5 tablets (200 mg total) by mouth daily.  . Melatonin 5 MG TABS Take 1 tablet by mouth at bedtime as needed (for sleep).  . Menthol, Topical Analgesic, (BIOFREEZE EX) Apply 1 application topically daily as needed (for pain).  . nicotine polacrilex (NICORETTE) 2 MG gum Take 2 mg by mouth as needed for smoking cessation.  . ondansetron (ZOFRAN-ODT) 4 MG disintegrating tablet DISSOLVE 1 TABLET IN MOUTH EVERY 8 HOURS AS NEEDED FOR NAUSEA AND VOMITING  .  venlafaxine XR (EFFEXOR-XR) 150 MG 24 hr capsule Take 1 capsule (150 mg total) by mouth daily with breakfast.  . warfarin (COUMADIN) 4 MG tablet USE AS DIRECTED BY MD  . warfarin (COUMADIN) 4 MG tablet TAKE AS DIRECTED BY COUMADIN CLINIC   No facility-administered encounter medications on file as of 07/09/2018.     Allergies (verified) Crestor [rosuvastatin calcium]; Erythromycin; Gabapentin; Pregabalin; Pregabalin; Macrobid [nitrofurantoin]; and Losartan   History: Past Medical History:  Diagnosis Date  . Arthritis   . Chronic insomnia   . Complication of anesthesia    severe  itching night of surgery requiring meds- also couldnt swallow- throat "paralyzed""  . Fibromyalgia   . Glaucoma   . Hypercholesterolemia   . Hyperlipidemia   . Hypertension    PCP Dr Cari Caraway- [place]  05/15/11 with clearance and note on chart,    chest x ray, EKG 12/12 [facility], eccho 7/11 [facility]  . Hypothyroidism    s/p graves disease  . Kidney stone   . PD (Parkinson's disease) ([place])   . Peripheral vascular disease ([place])    PULMONARY EMBOLUS x 2- 2011, 2012/ FOLLOWED BY DR [id number] 12/12 [facility]   PT [facility]AES WILL STOP COUMADIN 3/12 and begin LOVONOX 05/17/11 as previously instructed  . Prediabetes   . Sinus infection    at present- dr Honor Loh PA aware- was seen there and at PCP 05/10/11  . Thyroid disease    Hypothyroidism   Past Surgical History:  Procedure Laterality Date  . ABDOMINAL HYSTERECTOMY    . APPENDECTOMY    . CATARACT EXTRACTION Bilateral   . CHOLECYSTECTOMY    . COLONOSCOPY    . EXPLORATORY LAPAROTOMY    . JOINT REPLACEMENT     left knee  7/12  . TOTAL KNEE ARTHROPLASTY  05/22/2011   Procedure: TOTAL KNEE ARTHROPLASTY;  Surgeon: Mauri Pole, MD;  Location: [facility];  Service: Orthopedics;  Laterality: Right;   Family History  Problem Relation Age of Onset  . Prostate cancer Father   . Stroke Father    Social History   Socioeconomic History  . Marital status: Widowed    Spouse name: Not on file  . Number of children: 2  . Years of education: Not on file  . Highest education level: Not on file  Occupational History  . Not on file  Social Needs  . Financial resource strain: Not on file  . Food insecurity:    Worry: Not on file    Inability: Not on file  . Transportation needs:    Medical: Not on file    Non-medical: Not on file  Tobacco Use  . Smoking status: Former Smoker    Packs/day: 1.00    Last attempt to quit: 05/10/1989    Years since quitting: 29.1  . Smokeless tobacco: Never Used  Substance and Sexual Activity  . Alcohol use: Yes    Alcohol/week:  0.0 standard drinks    Comment: one drink once a month  . Drug use: No  . Sexual activity: Not on file  Lifestyle  . Physical activity:    Days per week: Not on file    Minutes per session: Not on file  . Stress: Not on file  Relationships  . Social connections:    Talks on phone: Not on file    Gets together: Not on file    Attends religious service: Not on file    Active member of club or organization: Not on file  Attends meetings of clubs or organizations: Not on file    Relationship status: Not on file  Other Topics Concern  . Not on file  Social History Narrative   Lives at home alone   Drinks caffeine occasionally     Tobacco Counseling Counseling given: Not Answered   Clinical Intake:                        Activities of Daily Living In your present state of health, do you have any difficulty performing the following activities: 10/13/2017  Hearing? Y  Vision? N  Difficulty concentrating or making decisions? N  Walking or climbing stairs? Y  Dressing or bathing? N  Doing errands, shopping? N  Comment "I don't run any"  Some recent data might be hidden     Immunizations and Health Maintenance Immunization History  Administered Date(s) Administered  . Influenza, High Dose Seasonal PF 11/24/2015, 10/26/2016, 11/29/2017  . Pneumococcal Conjugate-13 01/06/2014  . Pneumococcal Polysaccharide-23 01/07/2008  . Tdap 09/02/2010, 03/01/2017  . Zoster 12/20/2010   Health Maintenance Due  Topic Date Due  . COLONOSCOPY  03/07/1993  . PNA vac Low Risk Adult (2 of 2 - PPSV23) 01/07/2015    Patient Care Team: [name], [name], MD as PCP - General (Family Medicine)  Indicate any recent Medical Services you may have received from other than [facility] providers in the past year (date may be approximate).     Assessment:   This is a routine wellness examination for [place]. Physical assessment deferred to PCP.  Hearing/Vision screen Unable to assess.  This visit is enabled though telemedicine due to Covid 19.  Dietary issues and exercise activities discussed:   Diet (meal preparation, eat out, water intake, caffeinated beverages, dairy products, fruits and vegetables): {Desc; diets:16563} Breakfast: Lunch:  Dinner:      Goals   None    Depression Screen PHQ 2/9 Scores 09/29/2016 06/07/2015 05/17/2015  PHQ - 2 Score 0 1 1  PHQ- 9 Score 0 - 6    Fall Risk Fall Risk  02/14/2018 10/09/2017 05/18/2017 09/29/2016 07/12/2016  Falls in the past year? 1 Yes Yes Yes Yes  Comment - - - - -  Number falls in past yr: 1 2 or more 2 or more 2 or more 2 or more  Comment - - - - -  Injury with Fall? 0 No No Yes Yes  Comment - - - - -  Risk Factor Category  - High Fall Risk High Fall Risk - High Fall Risk  Risk for fall due to : History of fall(s);Impaired balance/gait;Impaired mobility - - - -  Risk for fall due to: Comment - - - - -  Follow up Falls evaluation completed Falls evaluation completed Falls evaluation completed - Falls evaluation completed  Comment - - - - -    Cognitive Function: MMSE - Mini Mental State Exam 05/18/2017 07/12/2016 05/27/2014  Not completed: Refused Refused -  Orientation to time - - 5  Orientation to Place - - 5  Registration - - 3  Attention/ Calculation - - 4  Recall - - 3  Language- name 2 objects - - 2  Language- repeat - - 1  Language- follow 3 step command - - 3  Language- read & follow direction - - 1  Write a sentence - - 1  Copy design - - 0  Total score - - 28   Montreal Cognitive Assessment  01/25/2016  Visuospatial/  Executive (0/5) 2  Naming (0/3) 3  Attention: Read list of digits (0/2) 2  Attention: Read list of letters (0/1) 0  Attention: Serial 7 subtraction starting at 100 (0/3) 2  Language: Repeat phrase (0/2) 2  Language : Fluency (0/1) 0  Abstraction (0/2) 2  Delayed Recall (0/5) 5  Orientation (0/6) 5  Total 23  Adjusted Score (based on education) 23      Screening Tests Health  Maintenance  Topic Date Due  . COLONOSCOPY  03/07/1993  . PNA vac Low Risk Adult (2 of 2 - PPSV23) 01/07/2015  . INFLUENZA VACCINE  10/05/2018  . TETANUS/TDAP  03/02/2027  . DEXA SCAN  Completed  . Hepatitis C Screening  Completed      Plan:   ***  I have personally reviewed and noted the following in the patient's chart:   . Medical and social history . Use of alcohol, tobacco or illicit drugs  . Current medications and supplements . Functional ability and status . Nutritional status . Physical activity . Advanced directives . List of other physicians . Hospitalizations, surgeries, and ER visits in previous 12 months . Vitals . Screenings to include cognitive, depression, and falls . Referrals and appointments  In addition, I have reviewed and discussed with patient certain preventive protocols, quality metrics, and best practice recommendations. A written personalized care plan for preventive services as well as general preventive health recommendations were provided to patient.     Shela Nevin, [place]   07/08/2018

## 2018-07-09 ENCOUNTER — Other Ambulatory Visit: Payer: Self-pay

## 2018-07-09 ENCOUNTER — Ambulatory Visit: Payer: Medicare Other | Admitting: *Deleted

## 2018-07-09 ENCOUNTER — Encounter: Payer: Self-pay | Admitting: *Deleted

## 2018-07-09 ENCOUNTER — Ambulatory Visit (INDEPENDENT_AMBULATORY_CARE_PROVIDER_SITE_OTHER): Payer: Medicare Other | Admitting: *Deleted

## 2018-07-09 DIAGNOSIS — Z0000 Encounter for general adult medical examination without abnormal findings: Secondary | ICD-10-CM | POA: Diagnosis not present

## 2018-07-09 NOTE — Patient Instructions (Signed)
Please schedule your next medicare wellness visit with me in 1 yr.  Continue to eat heart healthy diet (full of fruits, vegetables, whole grains, lean protein, water--limit salt, fat, and sugar intake) and increase physical activity as tolerated.  Continue doing brain stimulating activities (puzzles, reading, adult coloring books, staying active) to keep memory sharp.   Bring a copy of your living will and/or healthcare power of attorney to your next office visit.   [name] [name] , Thank you for taking time to come for your Medicare Wellness Visit. I appreciate your ongoing commitment to your health goals. Please review the following plan we discussed and let me know if I can assist you in the future.   These are the goals we discussed: Goals    . DIET - EAT MORE FRUITS AND VEGETABLES       This is a list of the screening recommended for you and due dates:  Health Maintenance  Topic Date Due  . Colon Cancer Screening  03/07/1993  . Pneumonia vaccines (2 of 2 - PPSV23) 01/07/2015  . Flu Shot  10/05/2018  . Tetanus Vaccine  03/02/2027  . DEXA scan (bone density measurement)  Completed  .  Hepatitis C: One time screening is recommended by Center for Disease Control  (CDC) for  adults born from [birth year] through 1967.   Completed    Health Maintenance After Age 15 After age 48, you are at a higher risk for certain long-term diseases and infections as well as injuries from falls. Falls are a major cause of broken bones and head injuries in people who are older than age 75. Getting regular preventive care can help to keep you healthy and well. Preventive care includes getting regular testing and making lifestyle changes as recommended by your health care provider. Talk with your health care provider about:  Which screenings and tests you should have. A screening is a test that checks for a disease when you have no symptoms.  A diet and exercise plan that is right for you. What should I know  about screenings and tests to prevent falls? Screening and testing are the best ways to find a health problem early. Early diagnosis and treatment give you the best chance of managing medical conditions that are common after age 75. Certain conditions and lifestyle choices may make you more likely to have a fall. Your health care provider may recommend:  Regular vision checks. Poor vision and conditions such as cataracts can make you more likely to have a fall. If you wear glasses, make sure to get your prescription updated if your vision changes.  Medicine review. Work with your health care provider to regularly review all of the medicines you are taking, including over-the-counter medicines. Ask your health care provider about any side effects that may make you more likely to have a fall. Tell your health care provider if any medicines that you take make you feel dizzy or sleepy.  Osteoporosis screening. Osteoporosis is a condition that causes the bones to get weaker. This can make the bones weak and cause them to break more easily.  Blood pressure screening. Blood pressure changes and medicines to control blood pressure can make you feel dizzy.  Strength and balance checks. Your health care provider may recommend certain tests to check your strength and balance while standing, walking, or changing positions.  Foot health exam. Foot pain and numbness, as well as not wearing proper footwear, can make you more likely to have a  fall.  Depression screening. You may be more likely to have a fall if you have a fear of falling, feel emotionally low, or feel unable to do activities that you used to do.  Alcohol use screening. Using too much alcohol can affect your balance and may make you more likely to have a fall. What actions can I take to lower my risk of falls? General instructions  Talk with your health care provider about your risks for falling. Tell your health care provider if: ? You fall.  Be sure to tell your health care provider about all falls, even ones that seem minor. ? You feel dizzy, sleepy, or off-balance.  Take over-the-counter and prescription medicines only as told by your health care provider. These include any supplements.  Eat a healthy diet and maintain a healthy weight. A healthy diet includes low-fat dairy products, low-fat (lean) meats, and fiber from whole grains, beans, and lots of fruits and vegetables. Home safety  Remove any tripping hazards, such as rugs, cords, and clutter.  Install safety equipment such as grab bars in bathrooms and safety rails on stairs.  Keep rooms and walkways well-lit. Activity   Follow a regular exercise program to stay fit. This will help you maintain your balance. Ask your health care provider what types of exercise are appropriate for you.  If you need a cane or walker, use it as recommended by your health care provider.  Wear supportive shoes that have nonskid soles. Lifestyle  Do not drink alcohol if your health care provider tells you not to drink.  If you drink alcohol, limit how much you have: ? 0-1 drink a day for women. ? 0-2 drinks a day for men.  Be aware of how much alcohol is in your drink. In the [place] one drink equals one typical bottle of beer (12 oz), one-half glass of wine (5 oz), or one shot of hard liquor (1 oz).  Do not use any products that contain nicotine or tobacco, such as cigarettes and e-cigarettes. If you need help quitting, ask your health care provider. Summary  Having a healthy lifestyle and getting preventive care can help to protect your health and wellness after age 75.  Screening and testing are the best way to find a health problem early and help you avoid having a fall. Early diagnosis and treatment give you the best chance for managing medical conditions that are more common for people who are older than age 75.  Falls are a major cause of broken bones and head injuries in  people who are older than age 75. Take precautions to prevent a fall at home.  Work with your health care provider to learn what changes you can make to improve your health and wellness and to prevent falls. This information is not intended to replace advice given to you by your health care provider. Make sure you discuss any questions you have with your health care provider. Document Released: 01/03/2017 Document Revised: 01/03/2017 Document Reviewed: 01/03/2017 Elsevier Interactive Patient Education  2019 [employer].

## 2018-07-09 NOTE — Progress Notes (Addendum)
Virtual Visit via Video Note  I connected with patient on 07/09/18 at  2:00 PM EDT by a video enabled telemedicine application and verified that I am speaking with the correct person using two identifiers.  Video failed where pt could only see and hear me so we finished via phone.   THIS ENCOUNTER IS A VIRTUAL VISIT DUE TO COVID-19 - PATIENT WAS NOT SEEN IN THE OFFICE. PATIENT HAS CONSENTED TO VIRTUAL VISIT / TELEMEDICINE VISIT   Location of patient: home  Location of provider: office  I discussed the limitations of evaluation and management by telemedicine and the availability of in person appointments. The patient expressed understanding and agreed to proceed.   Subjective:   [name] [name] is a 73 y.o. female who presents for an Initial Medicare Annual Wellness Visit.  Review of Systems   No ROS.  Medicare Wellness Virtual Visit.  Visual/audio telehealth visit, UTA vital signs.   See social history for additional risk factors. Cardiac Risk Factors include: advanced age (>37men, >18 women);dyslipidemia;hypertensionLives alone in 1 story apt. In [employer] .Safety bracelet.  Sleep patterns:  Home Safety/Smoke Alarms: Feels safe in home. Smoke alarms in place.   Female:   Mammo- 12/13/17       Dexa scan-  12/13/17      [facility]- declines at this time.     Objective:      Advanced Directives 07/09/2018 02/11/2018 12/09/2017 10/13/2017 10/13/2017 02/12/2017 02/11/2017  Does Patient Have a Medical Advance Directive? Yes Yes Yes Yes Yes Yes No  Type of Paramedic of [place];Living will - Healthcare Power of [place];Living will Out of facility DNR (pink MOST or yellow form) [place] -  Does patient want to make changes to medical advance directive? No - Patient declined No - Patient declined No - Patient declined No - Patient declined - No - Patient declined -  Copy of [place] in Chart? No  - copy requested - - No - copy requested - No - copy requested -  Would patient like information on creating a medical advance directive? - - - - - - -    Current Medications (verified) Outpatient Encounter Medications as of 07/09/2018  Medication Sig  . acetaminophen (TYLENOL) 500 MG tablet Take 500 mg by mouth every 6 (six) hours as needed for mild pain.  Marland Kitchen amLODipine (NORVASC) 2.5 MG tablet Take 1 tablet (2.5 mg total) by mouth daily.  Marland Kitchen atorvastatin (LIPITOR) 10 MG tablet Take 1 tablet by mouth once daily  . carbidopa-levodopa (SINEMET IR) 25-100 MG tablet 2 in the morning, 1 in the afternoon, 1 in the evening  . cephALEXin (KEFLEX) 250 MG capsule Take 250 mg by mouth daily.  . Cholecalciferol (VITAMIN D3) 5000 UNITS CAPS Take 5,000 Units by mouth daily.   . cyanocobalamin 500 MCG tablet Take 500 mcg by mouth daily.  Marland Kitchen ketoconazole (NIZORAL) 2 % shampoo Apply 1 application topically 2 (two) times a week.  . levothyroxine (SYNTHROID, LEVOTHROID) 125 MCG tablet Take 1 tablet (125 mcg total) by mouth daily.  . magnesium oxide (MAG-OX) 400 (241.3 Mg) MG tablet Take 0.5 tablets (200 mg total) by mouth daily.  . Melatonin 5 MG TABS Take 1 tablet by mouth at bedtime as needed (for sleep).  . Menthol, Topical Analgesic, (BIOFREEZE EX) Apply 1 application topically daily as needed (for pain).  . nicotine polacrilex (NICORETTE) 2 MG gum Take 2 mg by mouth as needed for  smoking cessation.  Marland Kitchen venlafaxine XR (EFFEXOR-XR) 150 MG 24 hr capsule Take 1 capsule (150 mg total) by mouth daily with breakfast.  . warfarin (COUMADIN) 4 MG tablet TAKE AS DIRECTED BY COUMADIN CLINIC  . ondansetron (ZOFRAN-ODT) 4 MG disintegrating tablet DISSOLVE 1 TABLET IN MOUTH EVERY 8 HOURS AS NEEDED FOR NAUSEA AND VOMITING (Patient not taking: Reported on 07/09/2018)  . [DISCONTINUED] warfarin (COUMADIN) 4 MG tablet USE AS DIRECTED BY MD   No facility-administered encounter medications on file as of 07/09/2018.     Allergies  (verified) Crestor [rosuvastatin calcium]; Erythromycin; Gabapentin; Pregabalin; Pregabalin; Macrobid [nitrofurantoin]; and Losartan   History: Past Medical History:  Diagnosis Date  . Arthritis   . Chronic insomnia   . Complication of anesthesia    severe itching night of surgery requiring meds- also couldnt swallow- throat "paralyzed""  . Fibromyalgia   . Glaucoma   . Hypercholesterolemia   . Hyperlipidemia   . Hypertension    PCP Dr Cari Caraway- [place]  05/15/11 with clearance and note on chart,    chest x ray, EKG 12/12 [facility], eccho 7/11 [facility]  . Hypothyroidism    s/p graves disease  . Kidney stone   . PD (Parkinson's disease) ([place])   . Peripheral vascular disease ([place])    PULMONARY EMBOLUS x 2- 2011, 2012/ FOLLOWED BY DR [id number] 12/12 [facility]   PT [facility]AES WILL STOP COUMADIN 3/12 and begin LOVONOX 05/17/11 as previously instructed  . Prediabetes   . Sinus infection    at present- dr Honor Loh PA aware- was seen there and at PCP 05/10/11  . Thyroid disease    Hypothyroidism   Past Surgical History:  Procedure Laterality Date  . ABDOMINAL HYSTERECTOMY    . APPENDECTOMY    . CATARACT EXTRACTION Bilateral   . CHOLECYSTECTOMY    . COLONOSCOPY    . EXPLORATORY LAPAROTOMY    . JOINT REPLACEMENT     left knee  7/12  . TOTAL KNEE ARTHROPLASTY  05/22/2011   Procedure: TOTAL KNEE ARTHROPLASTY;  Surgeon: Mauri Pole, MD;  Location: [facility];  Service: Orthopedics;  Laterality: Right;   Family History  Problem Relation Age of Onset  . Prostate cancer Father   . Stroke Father    Social History   Socioeconomic History  . Marital status: Widowed    Spouse name: Not on file  . Number of children: 2  . Years of education: Not on file  . Highest education level: Not on file  Occupational History  . Not on file  Social Needs  . Financial resource strain: Not on file  . Food insecurity:    Worry: Not on file    Inability: Not on file  . Transportation needs:    Medical: Not on file     Non-medical: Not on file  Tobacco Use  . Smoking status: Former Smoker    Packs/day: 1.00    Last attempt to quit: 05/10/1989    Years since quitting: 29.1  . Smokeless tobacco: Never Used  Substance and Sexual Activity  . Alcohol use: Yes    Alcohol/week: 0.0 standard drinks    Comment: one drink once a month  . Drug use: No  . Sexual activity: Not on file  Lifestyle  . Physical activity:    Days per week: Not on file    Minutes per session: Not on file  . Stress: Not on file  Relationships  . Social connections:    Talks on phone: Not on  file    Gets together: Not on file    Attends religious service: Not on file    Active member of club or organization: Not on file    Attends meetings of clubs or organizations: Not on file    Relationship status: Not on file  Other Topics Concern  . Not on file  Social History Narrative   Lives at home alone   Drinks caffeine occasionally     Tobacco Counseling Counseling given: Not Answered   Clinical Intake:     Pain : No/denies pain                  Activities of Daily Living In your present state of health, do you have any difficulty performing the following activities: 07/09/2018 10/13/2017  Hearing? N Y  Vision? N N  Difficulty concentrating or making decisions? N N  Walking or climbing stairs? N Y  Dressing or bathing? N N  Doing errands, shopping? Y N  Comment no longer drives "I don't run anyChief Executive Officer and eating ? Y -  Using the Toilet? N -  In the past six months, have you accidently leaked urine? Y -  Comment Wears diaper -  Do you have problems with loss of bowel control? N -  Managing your Medications? N -  Managing your Finances? N -  Some recent data might be hidden     Immunizations and Health Maintenance Immunization History  Administered Date(s) Administered  . Influenza, High Dose Seasonal PF 11/24/2015, 10/26/2016, 11/29/2017  . Pneumococcal Conjugate-13 01/06/2014  .  Pneumococcal Polysaccharide-23 01/07/2008  . Tdap 09/02/2010, 03/01/2017  . Zoster 12/20/2010   Health Maintenance Due  Topic Date Due  . COLONOSCOPY  03/07/1993  . PNA vac Low Risk Adult (2 of 2 - PPSV23) 01/07/2015    Patient Care Team: [name], [name], MD as PCP - General (Family Medicine)  Indicate any recent Medical Services you may have received from other than [facility] providers in the past year (date may be approximate).     Assessment:   This is a routine wellness examination for [place]. Physical assessment deferred to PCP.  Hearing/Vision screen Unable to assess. This visit is enabled though telemedicine due to Covid 19.  Dietary issues and exercise activities discussed: Current Exercise Habits: The patient does not participate in regular exercise at present, Exercise limited by: None identified Diet (meal preparation, eat out, water intake, caffeinated beverages, dairy products, fruits and vegetables): in general, an "unhealthy" diet, on average, 2 meals per day  DRinks a lot of water each day.   Goals    . DIET - EAT MORE FRUITS AND VEGETABLES      Depression Screen PHQ 2/9 Scores 07/09/2018 09/29/2016 06/07/2015 05/17/2015  PHQ - 2 Score 0 0 1 1  PHQ- 9 Score - 0 - 6    Fall Risk Fall Risk  07/09/2018 02/14/2018 10/09/2017 05/18/2017 09/29/2016  Falls in the past year? 1 1 Yes Yes Yes  Comment - - - - -  Number falls in past yr: 1 1 2  or more 2 or more 2 or more  Comment - - - - -  Injury with Fall? 0 0 No No Yes  Comment - - - - -  Risk Factor Category  - - High Fall Risk High Fall Risk -  Risk for fall due to : History of fall(s);Impaired balance/gait History of fall(s);Impaired balance/gait;Impaired mobility - - -  Risk for fall due to:  Comment - - - - -  Follow up - Falls evaluation completed Falls evaluation completed Falls evaluation completed -  Comment - - - - -     Cognitive Function: Ad8 score reviewed for issues:  Issues making decisions:no  Less  interest in hobbies / activities:no  Repeats questions, stories (family complaining):no  Trouble using ordinary gadgets (microwave, computer, phone):no  Forgets the month or year: no  Mismanaging finances: no  Remembering appts:no  Daily problems with thinking and/or memory:no Ad8 score is=0     MMSE - Mini Mental State Exam 05/18/2017 07/12/2016 05/27/2014  Not completed: Refused Refused -  Orientation to time - - 5  Orientation to Place - - 5  Registration - - 3  Attention/ Calculation - - 4  Recall - - 3  Language- name 2 objects - - 2  Language- repeat - - 1  Language- follow 3 step command - - 3  Language- read & follow direction - - 1  Write a sentence - - 1  Copy design - - 0  Total score - - 28   Montreal Cognitive Assessment  01/25/2016  Visuospatial/ Executive (0/5) 2  Naming (0/3) 3  Attention: Read list of digits (0/2) 2  Attention: Read list of letters (0/1) 0  Attention: Serial 7 subtraction starting at 100 (0/3) 2  Language: Repeat phrase (0/2) 2  Language : Fluency (0/1) 0  Abstraction (0/2) 2  Delayed Recall (0/5) 5  Orientation (0/6) 5  Total 23  Adjusted Score (based on education) 23      Screening Tests Health Maintenance  Topic Date Due  . COLONOSCOPY  03/07/1993  . PNA vac Low Risk Adult (2 of 2 - PPSV23) 01/07/2015  . INFLUENZA VACCINE  10/05/2018  . TETANUS/TDAP  03/02/2027  . DEXA SCAN  Completed  . Hepatitis C Screening  Completed     Plan:    Please schedule your next medicare wellness visit with me in 1 yr.  Continue to eat heart healthy diet (full of fruits, vegetables, whole grains, lean protein, water--limit salt, fat, and sugar intake) and increase physical activity as tolerated.  Continue doing brain stimulating activities (puzzles, reading, adult coloring books, staying active) to keep memory sharp.   Bring a copy of your living will and/or healthcare power of attorney to your next office visit.    I have personally  reviewed and noted the following in the patient's chart:   . Medical and social history . Use of alcohol, tobacco or illicit drugs  . Current medications and supplements . Functional ability and status . Nutritional status . Physical activity . Advanced directives . List of other physicians . Hospitalizations, surgeries, and ER visits in previous 12 months . Vitals . Screenings to include cognitive, depression, and falls . Referrals and appointments  In addition, I have reviewed and discussed with patient certain preventive protocols, quality metrics, and best practice recommendations. A written personalized care plan for preventive services as well as general preventive health recommendations were provided to patient.     Shela Nevin, [place]   07/09/2018    I have reviewed MWE note by Ms. Vevelyn Royals and agree with her documentation Denny Peon MD

## 2018-07-10 ENCOUNTER — Ambulatory Visit (INDEPENDENT_AMBULATORY_CARE_PROVIDER_SITE_OTHER): Payer: Medicare Other

## 2018-07-10 ENCOUNTER — Other Ambulatory Visit: Payer: Self-pay

## 2018-07-10 DIAGNOSIS — Z7901 Long term (current) use of anticoagulants: Secondary | ICD-10-CM

## 2018-07-10 LAB — POCT INR: INR: 1.7 — AB (ref 2.0–3.0)

## 2018-07-10 NOTE — Progress Notes (Signed)
Pre visit review using our clinic tool,if applicable. No additional management support is needed unless otherwise documented below in the visit note.   Pt here for INR check per order from Dr. Lorelei Pont.  Goal INR = 2.0-3.0  Last INR = 2.45  Pt currently takes Coumadin 4 mg daily  Pt denies recent antibiotics, no dietary changes and no unusual bruising / bleeding. States she may have missed a dose yesterday. Patient having problems with her home machine. Codes did not match on machine and strip.   INR today = 1.7  Pt advised per Dr. Nani Ravens DOD. Take Coumadin 8 mg today and resume to 4 mg daily tomorrow. Return for INR check in 1 week. Appointment scheduled.

## 2018-07-17 ENCOUNTER — Other Ambulatory Visit: Payer: Self-pay

## 2018-07-17 ENCOUNTER — Ambulatory Visit (INDEPENDENT_AMBULATORY_CARE_PROVIDER_SITE_OTHER): Payer: Medicare Other

## 2018-07-17 ENCOUNTER — Encounter: Payer: Self-pay | Admitting: Family Medicine

## 2018-07-17 DIAGNOSIS — Z7901 Long term (current) use of anticoagulants: Secondary | ICD-10-CM | POA: Diagnosis not present

## 2018-07-17 LAB — POCT INR
INR: 1.8 — AB (ref 2.0–3.0)
INR: 2 (ref 2.0–3.0)
INR: 2.5 (ref 2.0–3.0)
INR: 2.5 (ref 2.0–3.0)

## 2018-07-17 NOTE — Progress Notes (Addendum)
Pt here for INR check per Dr. Lorelei Pont  Goal INR = 2.0-3.0  Last INR =1.7  Pt currently takes Coumadin 4 mg daily.  Pt denies dietary changes and no unusual bruising / bleeding. She has been taking cephalexin for uti.   INR today = 2.0  Pt advised per Dr. Lorelei Pont continue current dose recheck two weeks due to antibiotics   Pt here for INR check per  Goal INR =  Last INR =  Pt currently takes Coumadin   Pt denies recent antibiotics, no dietary changes and no unusual bruising / bleeding.  INR today =   Pt advised per

## 2018-07-26 ENCOUNTER — Telehealth: Payer: Self-pay

## 2018-07-26 NOTE — Telephone Encounter (Signed)
Getting INR tested, home inr if she wants to continue [employer]? [name] [name] INR therpaist [phone]

## 2018-07-31 ENCOUNTER — Other Ambulatory Visit: Payer: Self-pay

## 2018-07-31 ENCOUNTER — Ambulatory Visit (INDEPENDENT_AMBULATORY_CARE_PROVIDER_SITE_OTHER): Payer: Medicare Other

## 2018-07-31 DIAGNOSIS — Z7901 Long term (current) use of anticoagulants: Secondary | ICD-10-CM

## 2018-07-31 LAB — POCT INR: INR: 1.9 — AB (ref 2.0–3.0)

## 2018-07-31 NOTE — Progress Notes (Addendum)
Pt here for INR check per Dr. Lorelei Pont  Goal INR =2.0-3.0  Last INR =2.0  Pt currently takes Coumadin 4 mg daily  Pt takes daily cephalexin for recurrent utis, no dietary changes and no unusual bruising / bleeding.  INR today = 1.9, patient states she has not taken her coumadin today yet. Patient usually takes dose around 10:00.   Pt advised per Dr Lorelei Pont, pcp recheck in one week. continue current dose and follow up for recheck in one week.    Note reviewed, agree.  Will defer dose adjustment as she did not take yet today and close to range

## 2018-08-01 ENCOUNTER — Telehealth: Payer: Self-pay | Admitting: Family Medicine

## 2018-08-01 NOTE — Telephone Encounter (Signed)
Current coumadin 4 mg daily  = 28 mg a week

## 2018-08-03 NOTE — Telephone Encounter (Signed)
Gave her a call.  She is now taking low-dose Keflex every day, I think per her urologist.  She has been doing this for about 2 months now.  As her INR is still low with chronic Keflex, will have her increase weekly Coumadin slightly.  We will have her take 4 mg daily, except take 6 mg 1 day/week. = 30 mg total Recheck INR this week

## 2018-08-07 ENCOUNTER — Other Ambulatory Visit: Payer: Self-pay

## 2018-08-07 ENCOUNTER — Ambulatory Visit (INDEPENDENT_AMBULATORY_CARE_PROVIDER_SITE_OTHER): Payer: Medicare Other

## 2018-08-07 DIAGNOSIS — Z7901 Long term (current) use of anticoagulants: Secondary | ICD-10-CM

## 2018-08-07 LAB — POCT INR: INR: 2.6 (ref 2.0–3.0)

## 2018-08-07 NOTE — Progress Notes (Signed)
Noted. Agree with above.  [place], DO 08/07/18 4:44 PM

## 2018-08-07 NOTE — Progress Notes (Signed)
Pre visit review using our clinic review tool, if applicable. No additional management support is needed unless otherwise documented below in the visit note.  Pt here for INR check per Dr. Lorelei Pont PCP  Goal INR =2.0-3.0  Last INR =1.9  Pt currently takes Coumadin 6 mg on mondays and 4 mg every day the rest of the week.   Pt is on long term antibiotics-cephalexin,  no dietary changes and no unusual bruising / bleeding.   INR today = 2.6  Pt advised per Dr. Nani Ravens DOD, continue current regimen keep follow up with pcp in June.

## 2018-08-14 DIAGNOSIS — H04123 Dry eye syndrome of bilateral lacrimal glands: Secondary | ICD-10-CM | POA: Diagnosis not present

## 2018-08-15 ENCOUNTER — Ambulatory Visit: Payer: Medicare Other | Admitting: Neurology

## 2018-08-15 ENCOUNTER — Telehealth: Payer: Self-pay | Admitting: Neurology

## 2018-08-15 DIAGNOSIS — Z029 Encounter for administrative examinations, unspecified: Secondary | ICD-10-CM

## 2018-08-15 NOTE — Telephone Encounter (Signed)
Called spoke with patient daughter [name] [name] she was informed of date scheduled for 11/29/18 @2 :30 pm

## 2018-08-15 NOTE — Telephone Encounter (Signed)
So 11/29/18 is ok to keep her there? If so I will let her know that

## 2018-08-15 NOTE — Telephone Encounter (Signed)
Please be aware When can patient R/S for?

## 2018-08-15 NOTE — Telephone Encounter (Signed)
Pt will not be able to make it to her botox appt today because her driver's car broke down. She would like to r/s to a sooner date than August. Pls call her at her daughter's [place] phone [phone]. Pt's phone is out of service.

## 2018-08-15 NOTE — Telephone Encounter (Signed)
She can be r/s at her next appointment slot.  Unfortunately, botox slots are really limited.  Looks like she cancelled her Jan appointment for botox as well.  We schedule these a very long time in advance because of the complexity of the schedule

## 2018-08-15 NOTE — Telephone Encounter (Signed)
yes

## 2018-08-19 LAB — POCT INR: INR: 2.1 (ref 2.0–3.0)

## 2018-08-20 ENCOUNTER — Encounter: Payer: Self-pay | Admitting: Neurology

## 2018-08-21 DIAGNOSIS — M7061 Trochanteric bursitis, right hip: Secondary | ICD-10-CM | POA: Diagnosis not present

## 2018-08-21 DIAGNOSIS — M7062 Trochanteric bursitis, left hip: Secondary | ICD-10-CM | POA: Diagnosis not present

## 2018-08-21 DIAGNOSIS — M25551 Pain in right hip: Secondary | ICD-10-CM | POA: Diagnosis not present

## 2018-08-21 DIAGNOSIS — M25552 Pain in left hip: Secondary | ICD-10-CM | POA: Diagnosis not present

## 2018-08-23 ENCOUNTER — Ambulatory Visit: Payer: Medicare Other | Admitting: *Deleted

## 2018-08-26 ENCOUNTER — Ambulatory Visit: Payer: Medicare Other | Admitting: Family Medicine

## 2018-08-26 LAB — POCT INR: INR: 2.1 (ref 2.0–3.0)

## 2018-08-28 DIAGNOSIS — R7303 Prediabetes: Secondary | ICD-10-CM | POA: Insufficient documentation

## 2018-08-28 NOTE — Progress Notes (Addendum)
[place] [facility] [place], [place], [place] [address] [phone]  Date:  08/29/2018   Name:  [name] [name]   DOB:  [birth year]   MRN:  [id number]  PCP:  Darreld Mclean, MD    Chief Complaint: INR CHECK (4 month follow up, refills cephalexin) and lab work   History of Present Illness:  Hazeline Charnley is a 73 y.o. very pleasant female patient who presents with the following:  Here today for a periodic follow-up visit History of TIA, HTN, hypothyroidism, parkinson's disease/ gait difficulty, PE due to hypercoagulable state, hyperlipidemia, pre-diabetes   She is on coumadin and manages this at home with a home INR - would like to check INR here today  She lives at [facility], in independent living.  [name] is involved in her care  Colon cancer screening: she has cologuard kit at home- encouraged her to do this asap  Pneumovax- would like a booster as she had pneumovax at age 54  Amlodipine 2.5 lipitor Sinemet Keflex for chronic UTI Levothyroxine 125 effexor Coumadin she is taking 6 mg once a week, 3m 6 days  She is taking daily keflex 250 per her urologist for chronic/ recurrent UTI.  She wonders if I can refill this for her today   She is overall feeling well, except she will fall on occasion She would like an electric scooter- she would like a note from me to help her get this  She is not checking her glucose at home  Lab Results  Component Value Date   HGBA1C 6.4 09/27/2017     Lab Results  Component Value Date   INR 2.1 08/29/2018   INR 2.6 08/07/2018   INR 1.9 (A) 07/31/2018   PROTIME 32.4 (H) 02/14/2011   PROTIME 36.0 (H) 07/26/2010    Lab Results  Component Value Date   TSH 0.60 02/20/2018   Lab Results  Component Value Date   HGBA1C 6.4 09/27/2017     Patient Active Problem List   Diagnosis Date Noted  . Pre-diabetes 08/28/2018  . Adenoma of right adrenal gland  02/20/2018  . Osteopenia 12/18/2017  . Glaucoma 10/13/2017  . Transient cerebral ischemic attack 10/13/2017  . History of prediabetes 10/13/2017  . AKI (acute kidney injury) ([facility] 02/12/2017  . Recurrent UTI 11/23/2016  . Fibromyalgia 11/23/2016  . Pulmonary HTN ([facility] 11/23/2016  . Left ventricular diastolic dysfunction 062/95/2841 . History of pulmonary embolus (PE) 2/2 hypercoagulable state 11/23/2016  . Sepsis ([facility] 09/17/2016  . SIRS (systemic inflammatory response syndrome) ([facility] 04/29/2016  . Hyperlipidemia 04/29/2016  . Hypothyroidism 04/29/2016  . Hematuria 04/05/2016  . Benign essential HTN   . Displaced fracture of fifth cervical vertebra ([facility] 12/17/2015  . Gait disturbance 12/17/2015  . Trochanteric bursitis of both hips 09/13/2015  . Chronic low back pain 06/07/2015  . Parkinson's disease ([facility] 05/17/2015  . S/P right TKA 05/22/2011    Past Medical History:  Diagnosis Date  . Arthritis   . Chronic insomnia   . Complication of anesthesia    severe itching night of surgery requiring meds- also couldnt swallow- throat "paralyzed""  . Fibromyalgia   . Glaucoma   . Hypercholesterolemia   . Hyperlipidemia   . Hypertension    [place] [facility] 05/15[place]11 with clearance and note on chart,    chest x ray, EKG 12/12 EPIC, eccho 7/11 EPIC  . Hypothyroidism  s/p graves disease  . Kidney stone   . PD (Parkinson's disease) ([place])   . Peripheral vascular disease ([place])    PULMONARY EMBOLUS x 2- 2011, 2012/ FOLLOWED BY DR ODOGWU-LOV 12/12 EPIC   PT STAES WILL STOP COUMADIN 3/12 and begin LOVONOX 05/17/11 as previously instructed  . Prediabetes   . Sinus infection    at present- dr Honor Loh PA aware- was seen there and at PCP 05/10/11  . Thyroid disease    Hypothyroidism    Past Surgical History:  Procedure Laterality Date  . ABDOMINAL HYSTERECTOMY    . APPENDECTOMY    . CATARACT EXTRACTION Bilateral   . CHOLECYSTECTOMY    . COLONOSCOPY    . EXPLORATORY LAPAROTOMY     . JOINT REPLACEMENT     left knee  7/12  . TOTAL KNEE ARTHROPLASTY  05/22/2011   Procedure: TOTAL KNEE ARTHROPLASTY;  Surgeon: Mauri Pole, MD;  Location: [facility];  Service: Orthopedics;  Laterality: Right;    Social History   Tobacco Use  . Smoking status: Former Smoker    Packs/day: 1.00    Quit date: 05/10/1989    Years since quitting: 29.3  . Smokeless tobacco: Never Used  Substance Use Topics  . Alcohol use: Yes    Alcohol/week: 0.0 standard drinks    Comment: one drink once a month  . Drug use: No    Family History  Problem Relation Age of Onset  . Prostate cancer Father   . Stroke Father     Allergies  Allergen Reactions  . Crestor [Rosuvastatin Calcium] Other (See Comments)    Feeling very bad, aching all over.  . Erythromycin Hives  . Gabapentin Other (See Comments)    Blurred vision  . Pregabalin Other (See Comments)    Crossed vision.  . Pregabalin Other (See Comments)    Crossed vision. Unknown   . Macrobid [Nitrofurantoin] Hives  . Losartan Itching    Medication list has been reviewed and updated.  Current Outpatient Medications on File Prior to Visit  Medication Sig Dispense Refill  . acetaminophen (TYLENOL) 500 MG tablet Take 500 mg by mouth every 6 (six) hours as needed for mild pain.    Marland Kitchen amLODipine (NORVASC) 2.5 MG tablet Take 1 tablet (2.5 mg total) by mouth daily. 90 tablet 1  . atorvastatin (LIPITOR) 10 MG tablet Take 1 tablet by mouth once daily 90 tablet 1  . carbidopa-levodopa (SINEMET IR) 25-100 MG tablet 2 in the morning, 1 in the afternoon, 1 in the evening 360 tablet 1  . cephALEXin (KEFLEX) 250 MG capsule Take 250 mg by mouth daily.  11  . Cholecalciferol (VITAMIN D3) 5000 UNITS CAPS Take 5,000 Units by mouth daily.     . cyanocobalamin 500 MCG tablet Take 500 mcg by mouth daily.    Marland Kitchen ketoconazole (NIZORAL) 2 % shampoo Apply 1 application topically 2 (two) times a week. 120 mL 3  . levothyroxine (SYNTHROID, LEVOTHROID) 125 MCG  tablet Take 1 tablet (125 mcg total) by mouth daily. 30 tablet 5  . magnesium oxide (MAG-OX) 400 (241.3 Mg) MG tablet Take 0.5 tablets (200 mg total) by mouth daily. 45 tablet 1  . Melatonin 5 MG TABS Take 1 tablet by mouth at bedtime as needed (for sleep).    . Menthol, Topical Analgesic, (BIOFREEZE EX) Apply 1 application topically daily as needed (for pain).    . nicotine polacrilex (NICORETTE) 2 MG gum Take 2 mg by mouth as needed for smoking cessation.    Marland Kitchen  ondansetron (ZOFRAN-ODT) 4 MG disintegrating tablet DISSOLVE 1 TABLET IN MOUTH EVERY 8 HOURS AS NEEDED FOR NAUSEA AND VOMITING 90 tablet 0  . venlafaxine XR (EFFEXOR-XR) 150 MG 24 hr capsule Take 1 capsule (150 mg total) by mouth daily with breakfast. 90 capsule 1  . warfarin (COUMADIN) 4 MG tablet TAKE AS DIRECTED BY COUMADIN CLINIC 90 tablet 1   No current facility-administered medications on file prior to visit.     Review of Systems:  As per HPI- otherwise negative. No fever or chills She has felt well   Physical Examination: Vitals:   08/29/18 1404  BP: (!) 146/80  Pulse: 96  Resp: 18  Temp: 97.8 F (36.6 C)  SpO2: 97%   Vitals:   08/29/18 1404  Weight: 177 lb (80.3 kg)  Height: _0  (1.676 m)   Body mass index is 28.57 kg/m. Ideal Body Weight: Weight in (lb) to have BMI = 25: 154.6  GEN: WDWN, NAD, Non-toxic, A & O x 3, overweight, appears well, her normal self HEENT: Atraumatic, Normocephalic. Neck supple. No masses, No LAD. Ears and Nose: No external deformity. CV: RRR, No M/G/R. No JVD. No thrill. No extra heart sounds. PULM: CTA B, no wheezes, crackles, rhonchi. No retractions. No resp. distress. No accessory muscle use. ABD: S, NT, ND, +BS. No rebound. No HSM. EXTR: No c/c/e NEURO Normal gait for pt- uses a walker PSYCH: Normally interactive. Conversant. Not depressed or anxious appearing.  Calm demeanor.    Assessment and Plan:   ICD-10-CM   1. Encounter for current long-term use of  anticoagulants  Z79.01 POCT INR    CANCELED: Protime-INR  2. Parkinson disease (HCC)  G20 venlafaxine XR (EFFEXOR-XR) 150 MG 24 hr capsule  3. Essential hypertension  I10 CBC    Comprehensive metabolic panel    amLODipine (NORVASC) 2.5 MG tablet  4. Hypothyroidism, adult  E03.9 TSH  5. Mixed hyperlipidemia  E78.2 Lipid panel    atorvastatin (LIPITOR) 10 MG tablet  6. Pre-diabetes  R73.03 Hemoglobin A1c  7. Immunization due  Z23 Pneumococcal polysaccharide vaccine 23-valent greater than or equal to 2yo subcutaneous/IM  8. Recurrent UTI  N39.0 cephALEXin (KEFLEX) 250 MG capsule  9. History of pulmonary embolus (PE) 2/2 hypercoagulable state  Z86.711 warfarin (COUMADIN) 4 MG tablet   Routine follow-up visit today INR is therapeutic Did other refills for her today BP is under reasonable control  Will plan further follow- up pending labs.   Follow-up: No follow-ups on file.  Meds ordered this encounter  Medications  . cephALEXin (KEFLEX) 250 MG capsule    Sig: Take 1 capsule (250 mg total) by mouth daily.    Dispense:  90 capsule    Refill:  3  . amLODipine (NORVASC) 2.5 MG tablet    Sig: Take 1 tablet (2.5 mg total) by mouth daily.    Dispense:  90 tablet    Refill:  3  . atorvastatin (LIPITOR) 10 MG tablet    Sig: Take 1 tablet (10 mg total) by mouth daily.    Dispense:  90 tablet    Refill:  3  . venlafaxine XR (EFFEXOR-XR) 150 MG 24 hr capsule    Sig: Take 1 capsule (150 mg total) by mouth daily with breakfast.    Dispense:  90 capsule    Refill:  3  . warfarin (COUMADIN) 4 MG tablet    Sig: TAKE AS DIRECTED BY COUMADIN CLINIC    Dispense:  90 tablet    Refill:  1   Orders Placed This Encounter  Procedures  . Pneumococcal polysaccharide vaccine 23-valent greater than or equal to 2yo subcutaneous/IM  . CBC  . Comprehensive metabolic panel  . Hemoglobin A1c  . Lipid panel  . TSH  . POCT INR     Signed Lamar Blinks, MD  Called pt 6/28 and was able to reach  her Labs are ok except her Alk phos is up and TSH is too low Decrease levothyroxine from 125 to 100 mcg Recheck in one month Will have her come in the office in a month as lab visits are hard to come by   Results for orders placed or performed in visit on 08/29/18  POCT INR  Result Value Ref Range   INR 2.1 2.0 - 3.0   Results for orders placed or performed in visit on 08/29/18  CBC  Result Value Ref Range   WBC 9.4 4.0 - 10.5 K/uL   RBC 5.36 (H) 3.87 - 5.11 Mil/uL   Platelets 318.0 150.0 - 400.0 K/uL   Hemoglobin 15.5 (H) 12.0 - 15.0 g/dL   HCT 47.8 (H) 36.0 - 46.0 %   MCV 89.2 78.0 - 100.0 fl   MCHC 32.5 30.0 - 36.0 g/dL   RDW 14.0 11.5 - 15.5 %  Comprehensive metabolic panel  Result Value Ref Range   Sodium 143 135 - 145 mEq/L   Potassium 4.6 3.5 - 5.1 mEq/L   Chloride 104 96 - 112 mEq/L   CO2 30 19 - 32 mEq/L   Glucose, Bld 129 (H) 70 - 99 mg/dL   BUN 15 6 - 23 mg/dL   Creatinine, Ser 0.75 0.40 - 1.20 mg/dL   Total Bilirubin 0.7 0.2 - 1.2 mg/dL   Alkaline Phosphatase 158 (H) 39 - 117 U/L   AST 14 0 - 37 U/L   ALT 5 0 - 35 U/L   Total Protein 6.6 6.0 - 8.3 g/dL   Albumin 4.1 3.5 - 5.2 g/dL   Calcium 9.1 8.4 - 10.5 mg/dL   GFR 75.24 >60.00 mL/min  Hemoglobin A1c  Result Value Ref Range   Hgb A1c MFr Bld 5.6 4.6 - 6.5 %  Lipid panel  Result Value Ref Range   Cholesterol 154 0 - 200 mg/dL   Triglycerides 149.0 0.0 - 149.0 mg/dL   HDL 59.80 >39.00 mg/dL   VLDL 29.8 0.0 - 40.0 mg/dL   LDL Cholesterol 64 0 - 99 mg/dL   Total CHOL/HDL Ratio 3    NonHDL 93.82   TSH  Result Value Ref Range   TSH 0.12 (L) 0.35 - 4.50 uIU/mL  POCT INR  Result Value Ref Range   INR 2.1 2.0 - 3.0

## 2018-08-29 ENCOUNTER — Encounter: Payer: Self-pay | Admitting: Family Medicine

## 2018-08-29 ENCOUNTER — Other Ambulatory Visit: Payer: Self-pay

## 2018-08-29 ENCOUNTER — Ambulatory Visit (INDEPENDENT_AMBULATORY_CARE_PROVIDER_SITE_OTHER): Payer: Medicare Other | Admitting: Family Medicine

## 2018-08-29 VITALS — BP 122/80 | HR 96 | Temp 97.8°F | Resp 18 | Ht 66.0 in | Wt 177.0 lb

## 2018-08-29 DIAGNOSIS — N390 Urinary tract infection, site not specified: Secondary | ICD-10-CM

## 2018-08-29 DIAGNOSIS — G20 Parkinson's disease: Secondary | ICD-10-CM

## 2018-08-29 DIAGNOSIS — Z7901 Long term (current) use of anticoagulants: Secondary | ICD-10-CM

## 2018-08-29 DIAGNOSIS — R7303 Prediabetes: Secondary | ICD-10-CM | POA: Diagnosis not present

## 2018-08-29 DIAGNOSIS — G20A1 Parkinson's disease without dyskinesia, without mention of fluctuations: Secondary | ICD-10-CM

## 2018-08-29 DIAGNOSIS — E782 Mixed hyperlipidemia: Secondary | ICD-10-CM

## 2018-08-29 DIAGNOSIS — Z23 Encounter for immunization: Secondary | ICD-10-CM | POA: Diagnosis not present

## 2018-08-29 DIAGNOSIS — E039 Hypothyroidism, unspecified: Secondary | ICD-10-CM | POA: Diagnosis not present

## 2018-08-29 DIAGNOSIS — I10 Essential (primary) hypertension: Secondary | ICD-10-CM

## 2018-08-29 DIAGNOSIS — Z86711 Personal history of pulmonary embolism: Secondary | ICD-10-CM | POA: Diagnosis not present

## 2018-08-29 LAB — POCT INR: INR: 2.1 (ref 2.0–3.0)

## 2018-08-29 MED ORDER — CEPHALEXIN 250 MG PO CAPS: 250.0000 mg | ORAL_CAPSULE | Freq: Every day | ORAL | 3 refills | Status: DC

## 2018-08-29 MED ORDER — ATORVASTATIN CALCIUM 10 MG PO TABS: 10.0000 mg | ORAL_TABLET | Freq: Every day | ORAL | 3 refills | Status: DC

## 2018-08-29 MED ORDER — VENLAFAXINE HCL ER 150 MG PO CP24: 150.0000 mg | ORAL_CAPSULE | Freq: Every day | ORAL | 3 refills | Status: DC

## 2018-08-29 MED ORDER — AMLODIPINE BESYLATE 2.5 MG PO TABS: 2.5000 mg | ORAL_TABLET | Freq: Every day | ORAL | 3 refills | Status: DC

## 2018-08-29 MED ORDER — WARFARIN SODIUM 4 MG PO TABS: ORAL_TABLET | ORAL | 1 refills | Status: DC

## 2018-08-29 NOTE — Patient Instructions (Addendum)
Good to see you today!   I will be in touch with your labs INR in range today- recheck in 3-4 weeks I gave you a note so you can get a scooter- please let me know if you need any other paperwork for this  Your blood pressure is a bit high today- we will monitor and may need to increase your amlodipine if this continues  You got a pneumonia booster today Please complete your cologuard kit I would also suggest that you get the shingles vaccine at your drug store

## 2018-08-30 LAB — COMPREHENSIVE METABOLIC PANEL
ALT: 5 U/L (ref 0–35)
AST: 14 U/L (ref 0–37)
Albumin: 4.1 g/dL (ref 3.5–5.2)
Alkaline Phosphatase: 158 U/L — ABNORMAL HIGH (ref 39–117)
BUN: 15 mg/dL (ref 6–23)
CO2: 30 mEq/L (ref 19–32)
Calcium: 9.1 mg/dL (ref 8.4–10.5)
Chloride: 104 mEq/L (ref 96–112)
Creatinine, Ser: 0.75 mg/dL (ref 0.40–1.20)
GFR: 75.24 mL/min (ref >60.00)
Glucose, Bld: 129 mg/dL — ABNORMAL HIGH (ref 70–99)
Potassium: 4.6 mEq/L (ref 3.5–5.1)
Sodium: 143 mEq/L (ref 135–145)
Total Bilirubin: 0.7 mg/dL (ref 0.2–1.2)
Total Protein: 6.6 g/dL (ref 6.0–8.3)

## 2018-08-30 LAB — LIPID PANEL
Cholesterol: 154 mg/dL (ref 0–200)
HDL: 59.8 mg/dL (ref >39.00)
LDL Cholesterol: 64 mg/dL (ref 0–99)
NonHDL: 93.82
Total CHOL/HDL Ratio: 3
Triglycerides: 149 mg/dL (ref 0.0–149.0)
VLDL: 29.8 mg/dL (ref 0.0–40.0)

## 2018-08-30 LAB — CBC
HCT: 47.8 % — ABNORMAL HIGH (ref 36.0–46.0)
Hemoglobin: 15.5 g/dL — ABNORMAL HIGH (ref 12.0–15.0)
MCHC: 32.5 g/dL (ref 30.0–36.0)
MCV: 89.2 fl (ref 78.0–100.0)
Platelets: 318 10*3/uL (ref 150.0–400.0)
RBC: 5.36 Mil/uL — ABNORMAL HIGH (ref 3.87–5.11)
RDW: 14 % (ref 11.5–15.5)
WBC: 9.4 10*3/uL (ref 4.0–10.5)

## 2018-08-30 LAB — TSH: TSH: 0.12 u[IU]/mL — ABNORMAL LOW (ref 0.35–4.50)

## 2018-08-30 LAB — HEMOGLOBIN A1C: Hgb A1c MFr Bld: 5.6 % (ref 4.6–6.5)

## 2018-09-01 MED ORDER — LEVOTHYROXINE SODIUM 100 MCG PO TABS: 100.0000 ug | ORAL_TABLET | Freq: Every day | ORAL | 3 refills | Status: DC

## 2018-09-01 NOTE — Addendum Note (Signed)
Addended by: Lamar Blinks C on: 09/01/2018 05:31 PM   Modules accepted: Orders

## 2018-09-04 ENCOUNTER — Encounter: Payer: Self-pay | Admitting: Family Medicine

## 2018-09-11 DIAGNOSIS — Z7901 Long term (current) use of anticoagulants: Secondary | ICD-10-CM | POA: Diagnosis not present

## 2018-09-11 DIAGNOSIS — Z86718 Personal history of other venous thrombosis and embolism: Secondary | ICD-10-CM | POA: Diagnosis not present

## 2018-09-13 ENCOUNTER — Telehealth: Payer: Self-pay

## 2018-09-13 NOTE — Telephone Encounter (Signed)
Left message to schedule appointment in detailed voicemail. She is in next week to see pcp, gave her the option to call back to schedule or wait until she is in office at check out to schedule next visit.

## 2018-09-13 NOTE — Telephone Encounter (Signed)
-----   Message from Darreld Mclean, MD sent at 09/01/2018  5:31 PM EDT ----- Please call and schedule for an OV in about one month for follow-up and labs  TY

## 2018-09-14 NOTE — Progress Notes (Addendum)
[place] at [facility] [address] [place], [place] [address] [phone]  Date:  09/16/2018   Name:  [name] [name]   DOB:  07/09/1945   MRN:  [id number]  PCP:  Darreld Mclean, MD    Chief Complaint: Mobility assessment (Pt here for mobility assessment for power wheel chair. Pt states she has multiple falls in her home. Currently walks with a walker. Pt brings note with phone # [phone], option 8. Ext [phone] attn: [place]) and history of pulmonary embolism (Pt's last INR was 08/29/18 in office . Pt states home INR reading last Monday was 1.6.)   History of Present Illness:  [name] [name] is a 73 y.o. very pleasant female patient who presents with the following:  Following up in the office today to discuss a mobility scooter Patient with history of TIA, Parkinson's disease with gait disturbance, pulmonary embolism due to hypercoagulable state, chronic anticoagulation, hypertension  She would like to get an electric scooter to help her around the house due to frequent falls due to her parkinsons' disease  She estimates that she has fallen 12 times over the last month.  Thankfully she has not been seriously injured.  She is using a cane or rolling walker but continues to fall   Also needs INR today Current coumadin- taking 4 mg daily, except 6 mg on Monday  She did not yet drop her synthroid from 125 to 100 as she did not yet get the new rx - needs me to sent to mail away pharmacy for her  Otherwise no acute concerns today   Lab Results  Component Value Date   INR 2.3 09/16/2018   INR 2.1 08/29/2018   INR 2.1 08/26/2018   PROTIME 32.4 (H) 02/14/2011   PROTIME 36.0 (H) 07/26/2010   Lab Results  Component Value Date   TSH 0.12 (L) 08/29/2018   [facility] [place]  I called twice and left a message with my cell number.  Will wait to hear back   Patient Active Problem List   Diagnosis  Date Noted  . Pre-diabetes 08/28/2018  . Adenoma of right adrenal gland 02/20/2018  . Osteopenia 12/18/2017  . Glaucoma 10/13/2017  . Transient cerebral ischemic attack 10/13/2017  . History of prediabetes 10/13/2017  . AKI (acute kidney injury) ([place]) 02/12/2017  . Recurrent UTI 11/23/2016  . Fibromyalgia 11/23/2016  . Pulmonary HTN ([place]) 11/23/2016  . Left ventricular diastolic dysfunction 31/59/4585  . History of pulmonary embolus (PE) 2/2 hypercoagulable state 11/23/2016  . Sepsis ([place]) 09/17/2016  . SIRS (systemic inflammatory response syndrome) ([place]) 04/29/2016  . Hyperlipidemia 04/29/2016  . Hypothyroidism 04/29/2016  . Hematuria 04/05/2016  . Benign essential HTN   . Displaced fracture of fifth cervical vertebra ([place]) 12/17/2015  . Gait disturbance 12/17/2015  . Trochanteric bursitis of both hips 09/13/2015  . Chronic low back pain 06/07/2015  . Parkinson's disease ([place]) 05/17/2015  . S/P right TKA 05/22/2011    Past Medical History:  Diagnosis Date  . Arthritis   . Chronic insomnia   . Complication of anesthesia    severe itching night of surgery requiring meds- also couldnt swallow- throat "paralyzed""  . Fibromyalgia   . Glaucoma   . Hypercholesterolemia   . Hyperlipidemia   . Hypertension    PCP Dr Cari Caraway- [place] [facility]  05/15/11 with clearance and note on chart,    chest x ray, EKG 12/12 EPIC, eccho 7/11  EPIC  . Hypothyroidism    s/p graves disease  . Kidney stone   . PD (Parkinson's disease) ([place])   . Peripheral vascular disease ([place])    PULMONARY EMBOLUS x 2- 2011, 2012/ FOLLOWED BY DR ODOGWU-LOV 12/12 EPIC   PT STAES WILL STOP COUMADIN 3/12 and begin LOVONOX 05/17/11 as previously instructed  . Prediabetes   . Sinus infection    at present- dr Honor Loh PA aware- was seen there and at PCP 05/10/11  . Thyroid disease    Hypothyroidism    Past Surgical History:  Procedure Laterality Date  . ABDOMINAL HYSTERECTOMY    . APPENDECTOMY    . CATARACT EXTRACTION  Bilateral   . CHOLECYSTECTOMY    . COLONOSCOPY    . EXPLORATORY LAPAROTOMY    . JOINT REPLACEMENT     left knee  7/12  . TOTAL KNEE ARTHROPLASTY  05/22/2011   Procedure: TOTAL KNEE ARTHROPLASTY;  Surgeon: Mauri Pole, MD;  Location: [facility];  Service: Orthopedics;  Laterality: Right;    Social History   Tobacco Use  . Smoking status: Former Smoker    Packs/day: 1.00    Quit date: 05/10/1989    Years since quitting: 29.3  . Smokeless tobacco: Never Used  Substance Use Topics  . Alcohol use: Yes    Alcohol/week: 0.0 standard drinks    Comment: one drink once a month  . Drug use: No    Family History  Problem Relation Age of Onset  . Prostate cancer Father   . Stroke Father     Allergies  Allergen Reactions  . Crestor [Rosuvastatin Calcium] Other (See Comments)    Feeling very bad, aching all over.  . Erythromycin Hives  . Gabapentin Other (See Comments)    Blurred vision  . Pregabalin Other (See Comments)    Crossed vision.  . Pregabalin Other (See Comments)    Crossed vision. Unknown   . Macrobid [Nitrofurantoin] Hives  . Losartan Itching    Medication list has been reviewed and updated.  Current Outpatient Medications on File Prior to Visit  Medication Sig Dispense Refill  . acetaminophen (TYLENOL) 500 MG tablet Take 500 mg by mouth every 6 (six) hours as needed for mild pain.    Marland Kitchen amLODipine (NORVASC) 2.5 MG tablet Take 1 tablet (2.5 mg total) by mouth daily. 90 tablet 3  . atorvastatin (LIPITOR) 10 MG tablet Take 1 tablet (10 mg total) by mouth daily. 90 tablet 3  . carbidopa-levodopa (SINEMET IR) 25-100 MG tablet 2 in the morning, 1 in the afternoon, 1 in the evening 360 tablet 1  . cephALEXin (KEFLEX) 250 MG capsule Take 1 capsule (250 mg total) by mouth daily. 90 capsule 3  . Cholecalciferol (VITAMIN D3) 5000 UNITS CAPS Take 5,000 Units by mouth daily.     . cyanocobalamin 500 MCG tablet Take 500 mcg by mouth daily.    Marland Kitchen ketoconazole (NIZORAL) 2 %  shampoo Apply 1 application topically 2 (two) times a week. 120 mL 3  . magnesium oxide (MAG-OX) 400 (241.3 Mg) MG tablet Take 0.5 tablets (200 mg total) by mouth daily. 45 tablet 1  . Melatonin 5 MG TABS Take 1 tablet by mouth at bedtime as needed (for sleep).    . Menthol, Topical Analgesic, (BIOFREEZE EX) Apply 1 application topically daily as needed (for pain).    Marland Kitchen venlafaxine XR (EFFEXOR-XR) 150 MG 24 hr capsule Take 1 capsule (150 mg total) by mouth daily with breakfast. 90 capsule 3  .  warfarin (COUMADIN) 4 MG tablet TAKE AS DIRECTED BY COUMADIN CLINIC 90 tablet 1   No current facility-administered medications on file prior to visit.     Review of Systems:  As per HPI- otherwise negative. .no fever or chills No chest pain or SOB   Physical Examination: Vitals:   09/16/18 1511  BP: 140/86  Pulse: 86  Resp: 16  Temp: 98.3 F (36.8 C)  SpO2: 98%   Vitals:   09/16/18 1511  Weight: 176 lb (79.8 kg)  Height: 5\' 6"  (1.676 m)   Body mass index is 28.41 kg/m. Ideal Body Weight: Weight in (lb) to have BMI = 25: 154.6  GEN: WDWN, NAD, Non-toxic, A & O x 3, looks well, her normal self HEENT: Atraumatic, Normocephalic. Neck supple. No masses, No LAD. Ears and Nose: No external deformity. CV: RRR, No M/G/R. No JVD. No thrill. No extra heart sounds. PULM: CTA B, no wheezes, crackles, rhonchi. No retractions. No resp. distress. No accessory muscle use. EXTR: No c/c/e NEURO typical gait for pt- she walks slowly with a shuffling gait, depends on her walker  Normal strength of both UE, she does not have a pronounced tremor PSYCH: Normally interactive. Conversant. Not depressed or anxious appearing.  Calm demeanor.   Results for orders placed or performed in visit on 09/16/18  POCT INR  Result Value Ref Range   INR 2.3 2.0 - 3.0    Assessment and Plan:   ICD-10-CM   1. Mobility impaired  Z74.09   2. History of pulmonary embolus (PE) 2/2 hypercoagulable state  Z86.711 POCT  INR  3. Hypothyroidism, adult  E03.9 levothyroxine (SYNTHROID) 100 MCG tablet  4. Parkinson's disease ([place])  G20   5. Gait disorder  R26.9    Visit today for mobility assessment, in peson A power mobility device is a medical necessity for this pt to complete her in home mobility related ADLs   Changed to 100 mcg of levothyroxine today- she will come back in one month for TSH check INR is in range- continue current coumadin and recheck in one month   Follow-up: No follow-ups on file.  Meds ordered this encounter  Medications  . levothyroxine (SYNTHROID) 100 MCG tablet    Sig: Take 1 tablet (100 mcg total) by mouth daily.    Dispense:  30 tablet    Refill:  3   Orders Placed This Encounter  Procedures  . POCT INR      Signed Lamar Blinks, MD

## 2018-09-16 ENCOUNTER — Ambulatory Visit (INDEPENDENT_AMBULATORY_CARE_PROVIDER_SITE_OTHER): Payer: Medicare Other | Admitting: Family Medicine

## 2018-09-16 ENCOUNTER — Encounter: Payer: Self-pay | Admitting: Family Medicine

## 2018-09-16 ENCOUNTER — Other Ambulatory Visit: Payer: Self-pay

## 2018-09-16 VITALS — BP 140/86 | HR 86 | Temp 98.3°F | Resp 16 | Ht 66.0 in | Wt 176.0 lb

## 2018-09-16 DIAGNOSIS — E039 Hypothyroidism, unspecified: Secondary | ICD-10-CM

## 2018-09-16 DIAGNOSIS — Z86711 Personal history of pulmonary embolism: Secondary | ICD-10-CM | POA: Diagnosis not present

## 2018-09-16 DIAGNOSIS — R269 Unspecified abnormalities of gait and mobility: Secondary | ICD-10-CM

## 2018-09-16 DIAGNOSIS — G20 Parkinson's disease: Secondary | ICD-10-CM | POA: Diagnosis not present

## 2018-09-16 DIAGNOSIS — Z7409 Other reduced mobility: Secondary | ICD-10-CM

## 2018-09-16 LAB — POCT INR: INR: 2.3 (ref 2.0–3.0)

## 2018-09-16 MED ORDER — LEVOTHYROXINE SODIUM 100 MCG PO TABS: 100.0000 ug | ORAL_TABLET | Freq: Every day | ORAL | 3 refills | Status: DC

## 2018-09-16 NOTE — Patient Instructions (Signed)
INR is fine today- continue current coumadin dosage I called the mobility company twice- no answer so far.  I have left a message with [place] and will continue to try and reach her

## 2018-09-19 ENCOUNTER — Telehealth: Payer: Self-pay | Admitting: *Deleted

## 2018-09-19 NOTE — Telephone Encounter (Signed)
Received fax from [employer] requesting office note from 7/13 visit. Note faxed to [phone].

## 2018-10-01 ENCOUNTER — Telehealth: Payer: Self-pay

## 2018-10-01 NOTE — Telephone Encounter (Signed)
Copied from [place] [phone] [id number]. Topic: General - Inquiry >> Sep 30, 2018 11:06 AM Richardo Priest, NT wrote: Reason for CRM: Kennyth Lose, from [employer], called in to check on status of PA form she faxed to [phone], for Dr.[name] to fill out and return to fax [phone]. Please attention fax to [place]. Call back for Kennyth Lose is [phone]. Kennyth Lose states she will fax again to backup fax.

## 2018-10-02 NOTE — Telephone Encounter (Signed)
I called jackie back- she just needs the power mobility device evaluation form.  We faxed this but apparently it did not work Will fax to both [phone] and [phone]

## 2018-10-17 ENCOUNTER — Telehealth: Payer: Self-pay

## 2018-10-17 ENCOUNTER — Telehealth: Payer: Self-pay | Admitting: Family Medicine

## 2018-10-17 DIAGNOSIS — E039 Hypothyroidism, unspecified: Secondary | ICD-10-CM

## 2018-10-17 DIAGNOSIS — Z7901 Long term (current) use of anticoagulants: Secondary | ICD-10-CM

## 2018-10-17 NOTE — Telephone Encounter (Signed)
Called pt to make lab appt she stated she needed to call her daughter and see when she could get a ride. I informed her to call back a few days in advance due to limited schedule

## 2018-10-17 NOTE — Telephone Encounter (Signed)
Received fax regarding patient call. Patient called stating she needs labs for her thyroid tomorrow afternoon. No orders are placed. Please advise?

## 2018-10-18 ENCOUNTER — Other Ambulatory Visit: Payer: Medicare Other

## 2018-10-18 NOTE — Telephone Encounter (Signed)
LM informing pt of 1:30 lab appt today

## 2018-10-18 NOTE — Telephone Encounter (Signed)
Pt not able to come in today. She will check with her daughter and call back for lab appt.

## 2018-10-18 NOTE — Telephone Encounter (Signed)
Could you schedule this patient for a lab only appointment? Thank you!

## 2018-10-19 DIAGNOSIS — Z7901 Long term (current) use of anticoagulants: Secondary | ICD-10-CM | POA: Diagnosis not present

## 2018-10-19 DIAGNOSIS — Z86718 Personal history of other venous thrombosis and embolism: Secondary | ICD-10-CM | POA: Diagnosis not present

## 2018-10-23 ENCOUNTER — Other Ambulatory Visit: Payer: Self-pay | Admitting: Family Medicine

## 2018-10-29 ENCOUNTER — Other Ambulatory Visit (INDEPENDENT_AMBULATORY_CARE_PROVIDER_SITE_OTHER): Payer: Medicare Other

## 2018-10-29 ENCOUNTER — Other Ambulatory Visit: Payer: Self-pay

## 2018-10-29 DIAGNOSIS — E039 Hypothyroidism, unspecified: Secondary | ICD-10-CM | POA: Diagnosis not present

## 2018-10-29 DIAGNOSIS — Z7901 Long term (current) use of anticoagulants: Secondary | ICD-10-CM

## 2018-10-29 LAB — TSH: TSH: 2.15 u[IU]/mL (ref 0.35–4.50)

## 2018-10-29 LAB — PROTIME-INR
INR: 3.7 ratio — ABNORMAL HIGH (ref 0.8–1.0)
Prothrombin Time: 42.4 s — ABNORMAL HIGH (ref 9.6–13.1)

## 2018-10-31 ENCOUNTER — Telehealth: Payer: Self-pay | Admitting: Family Medicine

## 2018-10-31 DIAGNOSIS — G20 Parkinson's disease: Secondary | ICD-10-CM

## 2018-10-31 DIAGNOSIS — E039 Hypothyroidism, unspecified: Secondary | ICD-10-CM

## 2018-10-31 LAB — POCT INR: INR: 2.6 — AB (ref <=1.1)

## 2018-10-31 MED ORDER — MAGNESIUM OXIDE 400 MG PO TABS: 0.5000 | ORAL_TABLET | Freq: Every day | ORAL | 3 refills | Status: DC

## 2018-10-31 MED ORDER — LEVOTHYROXINE SODIUM 100 MCG PO TABS: 100.0000 ug | ORAL_TABLET | Freq: Every day | ORAL | 3 refills | Status: DC

## 2018-10-31 MED ORDER — VENLAFAXINE HCL ER 150 MG PO CP24: 150.0000 mg | ORAL_CAPSULE | Freq: Every day | ORAL | 3 refills | Status: DC

## 2018-10-31 NOTE — Telephone Encounter (Signed)
Received her INR, called pt to discuss  Results for orders placed or performed in visit on 10/29/18  Protime-INR  Result Value Ref Range   INR 3.7 (H) 0.8 - 1.0 ratio   Prothrombin Time 42.4 (H) 9.6 - 13.1 sec  TSH  Result Value Ref Range   TSH 2.15 0.35 - 4.50 uIU/mL    She checked her INR this am and it was 2.3-  She is taking 4 mg daily except one day she takes 1.5 tablets  She accidentally did this twice earlier in the week which is likely why her INR was elevated Back to normal today Go back to typical coumadin regimen, recheck home INR in one week

## 2018-11-07 ENCOUNTER — Encounter: Payer: Self-pay | Admitting: Family Medicine

## 2018-11-07 LAB — POCT INR: INR: 1.6 — AB (ref <=1.1)

## 2018-11-08 NOTE — Telephone Encounter (Signed)
Called her, had to [facility].  Will try again

## 2018-11-08 NOTE — Telephone Encounter (Signed)
Called and reached her She is taking 4 mg coumadin every day INR 1.6 4x7 = 28 mg per week Will have her increase to 32 mg- 4 mg 5x, 6 mg 2 days- and recheck in one week  She states understanding JC

## 2018-11-08 NOTE — Telephone Encounter (Signed)
Pt returning PCP call. Please advise.

## 2018-11-13 ENCOUNTER — Telehealth: Payer: Self-pay

## 2018-11-13 ENCOUNTER — Encounter: Payer: Self-pay | Admitting: Family Medicine

## 2018-11-13 NOTE — Telephone Encounter (Signed)
Copied from [place] [id number]. Topic: General - Call Back - No Documentation >> Nov 12, 2018  3:47 PM Erick Blinks wrote: Reason for CRM: Pt has INR results, requesting call back from nurse. Please advise Best contact: [phone] >> Nov 13, 2018 12:16 PM Celene Kras A wrote: Pt called and is requesting an update. Please advise.

## 2018-11-13 NOTE — Telephone Encounter (Signed)
See other encounter sent to provider.

## 2018-11-15 ENCOUNTER — Encounter: Payer: Self-pay | Admitting: Family Medicine

## 2018-11-22 DIAGNOSIS — Z7901 Long term (current) use of anticoagulants: Secondary | ICD-10-CM | POA: Diagnosis not present

## 2018-11-22 DIAGNOSIS — Z86718 Personal history of other venous thrombosis and embolism: Secondary | ICD-10-CM | POA: Diagnosis not present

## 2018-11-22 LAB — POCT INR: INR: 1.9 — AB (ref 2.0–3.0)

## 2018-11-29 ENCOUNTER — Ambulatory Visit (INDEPENDENT_AMBULATORY_CARE_PROVIDER_SITE_OTHER): Payer: Medicare Other | Admitting: Neurology

## 2018-11-29 ENCOUNTER — Other Ambulatory Visit: Payer: Self-pay

## 2018-11-29 DIAGNOSIS — G243 Spasmodic torticollis: Secondary | ICD-10-CM | POA: Diagnosis not present

## 2018-11-29 LAB — POCT INR: INR: 2.1 — AB (ref <=1.1)

## 2018-11-29 MED ORDER — ONABOTULINUMTOXINA 100 UNITS IJ SOLR
150.0000 [IU] | Freq: Once | INTRAMUSCULAR | Status: AC
  Administered 2018-11-29: 150 [IU] via INTRAMUSCULAR

## 2018-11-29 NOTE — Procedures (Signed)
[facility]   Procedure Note Botox  Attending: Dr. Wells Guiles [name] [name]  Preoperative Diagnosis(es): Cervical Dystonia  Result History  Onset of effect: n/a  Duration of Benefit: n/a  Adverse Effects: n/a   Consent obtained from: The patient Benefits discussed included, but were not limited to decreased muscle tightness, increased joint range of motion, and decreased pain.  Risk discussed included, but were not limited pain and discomfort, bleeding, bruising, excessive weakness, venous thrombosis, muscle atrophy and dysphagia.  A copy of the patient medication guide was given to the patient which explains the blackbox warning.  Patients identity and treatment sites confirmed Yes.  .  Details of Procedure: Skin was cleaned with alcohol.  A 30 gauge, 69mm  needle was introduced to the target muscle, except for posterior splenius where 27 gauge, 1.5 inch needle used.   Prior to injection, the needle plunger was aspirated to make sure the needle was not within a blood vessel.  There was no blood retrieved on aspiration.    Following is a summary of the muscles injected  And the amount of Botulinum toxin used:   Dilution 0.9% preservative free saline mixed with 100 u Botox type A to make 10 U per 0.1cc  Injections  Location Left  Right Units Number of sites        Sternocleidomastoid  40 40 1  Splenius Capitus, posterior approach 70  70 1  Splenius Capitus, lateral approach 40  40 1  Levator Scapulae      Trapezius            TOTAL UNITS:   150    Agent: Botulinum Type A ( Onobotulinum Toxin type A ).  1 vials of Botox were used, each containing 100 units and freshly diluted with 1 mL of sterile, non-preserved saline   Total injected (Units): 150  Total wasted (Units): 0   Pt tolerated procedure well without complications.   Reinjection is anticipated in 3 months.

## 2018-12-01 ENCOUNTER — Encounter: Payer: Self-pay | Admitting: Family Medicine

## 2018-12-01 ENCOUNTER — Telehealth: Payer: Self-pay | Admitting: Family Medicine

## 2018-12-01 NOTE — Telephone Encounter (Signed)
Received fax from PT/INR service that most recent INR on 9/18 was 1.9 Called her to go over and adjust coumadin.  LMOM- not sure if she has already rechecked her INR.  mychart message to pt

## 2018-12-02 ENCOUNTER — Encounter: Payer: Self-pay | Admitting: Family Medicine

## 2018-12-02 NOTE — Telephone Encounter (Signed)
Called her back-    INR 1.9 10 days ago- I received fax   Taking coumadin 4 mg 5 days and 6 mg 2 days = 32/w She increased her coumadin on her own to 4 mg 4 days and 6 mg 3 days = 34/w  She rechecked her INR this past satuday and it was 2.1 Continue this regimen  [place] job

## 2018-12-05 ENCOUNTER — Encounter: Payer: Self-pay | Admitting: Family Medicine

## 2018-12-11 ENCOUNTER — Other Ambulatory Visit: Payer: Self-pay | Admitting: Family Medicine

## 2018-12-11 DIAGNOSIS — N390 Urinary tract infection, site not specified: Secondary | ICD-10-CM

## 2018-12-11 MED ORDER — CEPHALEXIN 250 MG PO CAPS: 250.0000 mg | ORAL_CAPSULE | Freq: Every day | ORAL | 3 refills | Status: DC

## 2018-12-11 NOTE — Telephone Encounter (Signed)
Requested medication (s) are due for refill today: yes  Requested medication (s) are on the active medication list: yes  Last refill:  08/29/2018  Future visit scheduled: no  Notes to clinic:  Patient is no longer at [facility] and script can not be transferred   Requested Prescriptions  Pending Prescriptions Disp Refills   cephALEXin (KEFLEX) 250 MG capsule 90 capsule 3    Sig: Take 1 capsule (250 mg total) by mouth daily.     Off-Protocol Failed - 12/11/2018  2:41 PM      Failed - Medication not assigned to a protocol, review manually.      Passed - Valid encounter within last 12 months    Recent Outpatient Visits          2 months ago Mobility impaired   Archivist at [place], MD   3 months ago Encounter for current long-term use of anticoagulants   Archivist at [place], MD   7 months ago Anticoagulant long-term use   Archivist at [place], MD   11 months ago Medication monitoring Loss adjuster, chartered at [place], [name], MD   1 year ago Need for immunization against influenza   Archivist at [place], [name], MD

## 2018-12-11 NOTE — Telephone Encounter (Signed)
Copied from [place] [id number]. Topic: Quick Communication - Rx Refill/Question >> Dec 11, 2018  2:31 PM Leward Quan A wrote: Medication: cephALEXin (KEFLEX) 250 MG capsule   Patient no longer use [facility] was not transferable  Has the patient contacted their pharmacy? Yes.   (Agent: If no, request that the patient contact the pharmacy for the refill.) (Agent: If yes, when and what did the pharmacy advise?)  Preferred Pharmacy (with phone number or street name): [place], [place] [phone] (Phone) [phone] (Fax)    Agent: Please be advised that RX refills may take up to 3 business days. We ask that you follow-up with your pharmacy.

## 2018-12-25 DIAGNOSIS — Z86718 Personal history of other venous thrombosis and embolism: Secondary | ICD-10-CM | POA: Diagnosis not present

## 2018-12-25 DIAGNOSIS — Z7901 Long term (current) use of anticoagulants: Secondary | ICD-10-CM | POA: Diagnosis not present

## 2019-01-03 ENCOUNTER — Ambulatory Visit: Payer: Medicare Other | Admitting: Medical

## 2019-01-03 ENCOUNTER — Other Ambulatory Visit: Payer: Self-pay

## 2019-01-03 ENCOUNTER — Inpatient Hospital Stay (HOSPITAL_COMMUNITY)
Admission: EM | Admit: 2019-01-03 | Discharge: 2019-01-07 | DRG: 689 | Disposition: A | Discharge status: Skilled Nursing Facility | Payer: Medicare Other | Attending: Family Medicine | Admitting: Family Medicine

## 2019-01-03 ENCOUNTER — Encounter (HOSPITAL_COMMUNITY): Payer: Self-pay | Admitting: Pharmacy Technician

## 2019-01-03 ENCOUNTER — Emergency Department (HOSPITAL_COMMUNITY): Payer: Medicare Other

## 2019-01-03 DIAGNOSIS — N390 Urinary tract infection, site not specified: Secondary | ICD-10-CM | POA: Diagnosis not present

## 2019-01-03 DIAGNOSIS — Z9049 Acquired absence of other specified parts of digestive tract: Secondary | ICD-10-CM

## 2019-01-03 DIAGNOSIS — Z8619 Personal history of other infectious and parasitic diseases: Secondary | ICD-10-CM

## 2019-01-03 DIAGNOSIS — I10 Essential (primary) hypertension: Secondary | ICD-10-CM | POA: Diagnosis not present

## 2019-01-03 DIAGNOSIS — M069 Rheumatoid arthritis, unspecified: Secondary | ICD-10-CM | POA: Diagnosis present

## 2019-01-03 DIAGNOSIS — G20 Parkinson's disease: Secondary | ICD-10-CM | POA: Diagnosis present

## 2019-01-03 DIAGNOSIS — E871 Hypo-osmolality and hyponatremia: Secondary | ICD-10-CM

## 2019-01-03 DIAGNOSIS — H409 Unspecified glaucoma: Secondary | ICD-10-CM | POA: Diagnosis present

## 2019-01-03 DIAGNOSIS — Z87891 Personal history of nicotine dependence: Secondary | ICD-10-CM

## 2019-01-03 DIAGNOSIS — Z823 Family history of stroke: Secondary | ICD-10-CM

## 2019-01-03 DIAGNOSIS — R791 Abnormal coagulation profile: Secondary | ICD-10-CM

## 2019-01-03 DIAGNOSIS — R4020 Unspecified coma: Secondary | ICD-10-CM | POA: Diagnosis not present

## 2019-01-03 DIAGNOSIS — D6859 Other primary thrombophilia: Secondary | ICD-10-CM | POA: Diagnosis present

## 2019-01-03 DIAGNOSIS — M545 Low back pain: Secondary | ICD-10-CM | POA: Diagnosis not present

## 2019-01-03 DIAGNOSIS — Z888 Allergy status to other drugs, medicaments and biological substances status: Secondary | ICD-10-CM

## 2019-01-03 DIAGNOSIS — Z03818 Encounter for observation for suspected exposure to other biological agents ruled out: Secondary | ICD-10-CM | POA: Diagnosis not present

## 2019-01-03 DIAGNOSIS — Z66 Do not resuscitate: Secondary | ICD-10-CM | POA: Diagnosis present

## 2019-01-03 DIAGNOSIS — M797 Fibromyalgia: Secondary | ICD-10-CM | POA: Diagnosis present

## 2019-01-03 DIAGNOSIS — N179 Acute kidney failure, unspecified: Secondary | ICD-10-CM | POA: Diagnosis not present

## 2019-01-03 DIAGNOSIS — Z96653 Presence of artificial knee joint, bilateral: Secondary | ICD-10-CM | POA: Diagnosis present

## 2019-01-03 DIAGNOSIS — W19XXXA Unspecified fall, initial encounter: Secondary | ICD-10-CM | POA: Diagnosis not present

## 2019-01-03 DIAGNOSIS — M1990 Unspecified osteoarthritis, unspecified site: Secondary | ICD-10-CM | POA: Diagnosis present

## 2019-01-03 DIAGNOSIS — G9341 Metabolic encephalopathy: Secondary | ICD-10-CM | POA: Diagnosis present

## 2019-01-03 DIAGNOSIS — Z8673 Personal history of transient ischemic attack (TIA), and cerebral infarction without residual deficits: Secondary | ICD-10-CM

## 2019-01-03 DIAGNOSIS — E785 Hyperlipidemia, unspecified: Secondary | ICD-10-CM | POA: Diagnosis present

## 2019-01-03 DIAGNOSIS — R410 Disorientation, unspecified: Secondary | ICD-10-CM | POA: Diagnosis not present

## 2019-01-03 DIAGNOSIS — F5104 Psychophysiologic insomnia: Secondary | ICD-10-CM | POA: Diagnosis present

## 2019-01-03 DIAGNOSIS — R300 Dysuria: Secondary | ICD-10-CM | POA: Diagnosis not present

## 2019-01-03 DIAGNOSIS — Z79890 Hormone replacement therapy (postmenopausal): Secondary | ICD-10-CM

## 2019-01-03 DIAGNOSIS — S3993XA Unspecified injury of pelvis, initial encounter: Secondary | ICD-10-CM | POA: Diagnosis not present

## 2019-01-03 DIAGNOSIS — M25551 Pain in right hip: Secondary | ICD-10-CM | POA: Diagnosis not present

## 2019-01-03 DIAGNOSIS — R7303 Prediabetes: Secondary | ICD-10-CM | POA: Diagnosis present

## 2019-01-03 DIAGNOSIS — M25552 Pain in left hip: Secondary | ICD-10-CM | POA: Diagnosis not present

## 2019-01-03 DIAGNOSIS — Z90710 Acquired absence of both cervix and uterus: Secondary | ICD-10-CM

## 2019-01-03 DIAGNOSIS — Z86711 Personal history of pulmonary embolism: Secondary | ICD-10-CM | POA: Diagnosis not present

## 2019-01-03 DIAGNOSIS — I739 Peripheral vascular disease, unspecified: Secondary | ICD-10-CM | POA: Diagnosis present

## 2019-01-03 DIAGNOSIS — R296 Repeated falls: Secondary | ICD-10-CM | POA: Diagnosis present

## 2019-01-03 DIAGNOSIS — E7800 Pure hypercholesterolemia, unspecified: Secondary | ICD-10-CM | POA: Diagnosis present

## 2019-01-03 DIAGNOSIS — Z79899 Other long term (current) drug therapy: Secondary | ICD-10-CM

## 2019-01-03 DIAGNOSIS — Z20828 Contact with and (suspected) exposure to other viral communicable diseases: Secondary | ICD-10-CM | POA: Diagnosis present

## 2019-01-03 DIAGNOSIS — Z87442 Personal history of urinary calculi: Secondary | ICD-10-CM

## 2019-01-03 DIAGNOSIS — E039 Hypothyroidism, unspecified: Secondary | ICD-10-CM | POA: Diagnosis present

## 2019-01-03 DIAGNOSIS — Z7901 Long term (current) use of anticoagulants: Secondary | ICD-10-CM

## 2019-01-03 DIAGNOSIS — E861 Hypovolemia: Secondary | ICD-10-CM | POA: Diagnosis present

## 2019-01-03 DIAGNOSIS — E876 Hypokalemia: Secondary | ICD-10-CM | POA: Diagnosis present

## 2019-01-03 DIAGNOSIS — Z8042 Family history of malignant neoplasm of prostate: Secondary | ICD-10-CM

## 2019-01-03 DIAGNOSIS — Z9842 Cataract extraction status, left eye: Secondary | ICD-10-CM

## 2019-01-03 DIAGNOSIS — Z881 Allergy status to other antibiotic agents status: Secondary | ICD-10-CM

## 2019-01-03 DIAGNOSIS — Z87440 Personal history of urinary (tract) infections: Secondary | ICD-10-CM

## 2019-01-03 DIAGNOSIS — Z9841 Cataract extraction status, right eye: Secondary | ICD-10-CM

## 2019-01-03 HISTORY — DX: Abnormal coagulation profile: R79.1

## 2019-01-03 HISTORY — DX: Hypo-osmolality and hyponatremia: E87.1

## 2019-01-03 LAB — BASIC METABOLIC PANEL
Anion gap: 12 (ref 5–15)
BUN: 29 mg/dL — ABNORMAL HIGH (ref 8–23)
CO2: 25 mmol/L (ref 22–32)
Calcium: 9.3 mg/dL (ref 8.9–10.3)
Chloride: 94 mmol/L — ABNORMAL LOW (ref 98–111)
Creatinine, Ser: 1.32 mg/dL — ABNORMAL HIGH (ref 0.44–1.00)
GFR calc Af Amer: 46 mL/min — ABNORMAL LOW (ref >60)
GFR calc non Af Amer: 39 mL/min — ABNORMAL LOW (ref >60)
Glucose, Bld: 94 mg/dL (ref 70–99)
Potassium: 4.5 mmol/L (ref 3.5–5.1)
Sodium: 131 mmol/L — ABNORMAL LOW (ref 135–145)

## 2019-01-03 LAB — URINALYSIS, ROUTINE W REFLEX MICROSCOPIC
Bilirubin Urine: NEGATIVE
Glucose, UA: NEGATIVE mg/dL
Ketones, ur: NEGATIVE mg/dL
Nitrite: POSITIVE — AB
Protein, ur: NEGATIVE mg/dL
Specific Gravity, Urine: <1.005 — ABNORMAL LOW (ref 1.005–1.030)
pH: 6 (ref 5.0–8.0)

## 2019-01-03 LAB — CBC
HCT: 41.5 % (ref 36.0–46.0)
Hemoglobin: 13.8 g/dL (ref 12.0–15.0)
MCH: 29.6 pg (ref 26.0–34.0)
MCHC: 33.3 g/dL (ref 30.0–36.0)
MCV: 88.9 fL (ref 80.0–100.0)
Platelets: 200 10*3/uL (ref 150–400)
RBC: 4.67 MIL/uL (ref 3.87–5.11)
RDW: 12.7 % (ref 11.5–15.5)
WBC: 22.6 10*3/uL — ABNORMAL HIGH (ref 4.0–10.5)
nRBC: 0 % (ref 0.0–0.2)

## 2019-01-03 LAB — URINALYSIS, MICROSCOPIC (REFLEX)

## 2019-01-03 LAB — HEPATIC FUNCTION PANEL
ALT: 5 U/L (ref 0–44)
AST: 38 U/L (ref 15–41)
Albumin: 3 g/dL — ABNORMAL LOW (ref 3.5–5.0)
Alkaline Phosphatase: 131 U/L — ABNORMAL HIGH (ref 38–126)
Bilirubin, Direct: 0.6 mg/dL — ABNORMAL HIGH (ref 0.0–0.2)
Indirect Bilirubin: 1.3 mg/dL — ABNORMAL HIGH (ref 0.3–0.9)
Total Bilirubin: 1.9 mg/dL — ABNORMAL HIGH (ref 0.3–1.2)
Total Protein: 7 g/dL (ref 6.5–8.1)

## 2019-01-03 LAB — LACTIC ACID, PLASMA
Lactic Acid, Venous: 1.5 mmol/L (ref 0.5–1.9)
Lactic Acid, Venous: 1.3 mmol/L (ref 0.5–1.9)

## 2019-01-03 LAB — PROTIME-INR
INR: 1.7 — ABNORMAL HIGH (ref 0.8–1.2)
Prothrombin Time: 19.8 seconds — ABNORMAL HIGH (ref 11.4–15.2)

## 2019-01-03 MED ORDER — SODIUM CHLORIDE 0.9 % IV SOLN
INTRAVENOUS | Status: AC
  Administered 2019-01-03: 23:00:00 via INTRAVENOUS

## 2019-01-03 MED ORDER — ACETAMINOPHEN 325 MG PO TABS
650.0000 mg | ORAL_TABLET | Freq: Four times a day (QID) | ORAL | Status: DC | PRN
  Administered 2019-01-04 – 2019-01-05 (×2): 650 mg via ORAL
  Filled 2019-01-03 (×2): qty 2

## 2019-01-03 MED ORDER — CARBIDOPA-LEVODOPA 25-100 MG PO TABS
2.0000 | ORAL_TABLET | Freq: Every day | ORAL | Status: DC
  Administered 2019-01-04 – 2019-01-07 (×4): 2 via ORAL
  Filled 2019-01-03 (×5): qty 2

## 2019-01-03 MED ORDER — PIPERACILLIN-TAZOBACTAM 3.375 G IVPB 30 MIN
3.3750 g | Freq: Once | INTRAVENOUS | Status: AC
  Administered 2019-01-03: 3.375 g via INTRAVENOUS
  Filled 2019-01-03: qty 50

## 2019-01-03 MED ORDER — ONDANSETRON HCL 4 MG/2ML IJ SOLN: 4.0000 mg | Freq: Four times a day (QID) | INTRAMUSCULAR | Status: DC | PRN

## 2019-01-03 MED ORDER — WARFARIN - PHARMACIST DOSING INPATIENT
Freq: Every day | Status: DC
  Administered 2019-01-05: 18:00:00

## 2019-01-03 MED ORDER — VENLAFAXINE HCL ER 150 MG PO CP24
150.0000 mg | ORAL_CAPSULE | Freq: Every day | ORAL | Status: DC
  Administered 2019-01-04 – 2019-01-07 (×4): 150 mg via ORAL
  Filled 2019-01-03 (×5): qty 1

## 2019-01-03 MED ORDER — CARBIDOPA-LEVODOPA 25-100 MG PO TABS
1.0000 | ORAL_TABLET | ORAL | Status: DC
  Administered 2019-01-03 – 2019-01-07 (×8): 1 via ORAL
  Filled 2019-01-03 (×9): qty 1

## 2019-01-03 MED ORDER — ONDANSETRON HCL 4 MG PO TABS: 4.0000 mg | ORAL_TABLET | Freq: Four times a day (QID) | ORAL | Status: DC | PRN

## 2019-01-03 MED ORDER — LEVOTHYROXINE SODIUM 100 MCG PO TABS
100.0000 ug | ORAL_TABLET | Freq: Every day | ORAL | Status: DC
  Administered 2019-01-04 – 2019-01-07 (×4): 100 ug via ORAL
  Filled 2019-01-03 (×4): qty 1

## 2019-01-03 MED ORDER — MELATONIN 3 MG PO TABS
4.5000 mg | ORAL_TABLET | Freq: Every evening | ORAL | Status: DC | PRN
  Administered 2019-01-06 (×2): 4.5 mg via ORAL
  Filled 2019-01-03 (×4): qty 1.5

## 2019-01-03 MED ORDER — ATORVASTATIN CALCIUM 10 MG PO TABS
10.0000 mg | ORAL_TABLET | Freq: Every day | ORAL | Status: DC
  Administered 2019-01-04 – 2019-01-07 (×4): 10 mg via ORAL
  Filled 2019-01-03 (×4): qty 1

## 2019-01-03 MED ORDER — SODIUM CHLORIDE 0.9 % IV SOLN
1.0000 g | Freq: Two times a day (BID) | INTRAVENOUS | Status: DC
  Administered 2019-01-03 – 2019-01-05 (×4): 1 g via INTRAVENOUS
  Filled 2019-01-03 (×5): qty 1

## 2019-01-03 MED ORDER — WARFARIN SODIUM 6 MG PO TABS: 6.0000 mg | ORAL_TABLET | Freq: Once | ORAL | Status: DC

## 2019-01-03 MED ORDER — NICOTINE POLACRILEX 2 MG MT GUM
2.0000 mg | CHEWING_GUM | OROMUCOSAL | Status: DC | PRN
  Administered 2019-01-04 – 2019-01-05 (×3): 2 mg via ORAL
  Filled 2019-01-03 (×5): qty 1

## 2019-01-03 MED ORDER — LACTATED RINGERS IV BOLUS
1000.0000 mL | Freq: Once | INTRAVENOUS | Status: AC
  Administered 2019-01-03: 1000 mL via INTRAVENOUS

## 2019-01-03 MED ORDER — WARFARIN SODIUM 6 MG PO TABS
6.0000 mg | ORAL_TABLET | Freq: Once | ORAL | Status: AC
  Administered 2019-01-03: 6 mg via ORAL
  Filled 2019-01-03 (×2): qty 1

## 2019-01-03 MED ORDER — ACETAMINOPHEN 650 MG RE SUPP: 650.0000 mg | Freq: Four times a day (QID) | RECTAL | Status: DC | PRN

## 2019-01-03 MED ORDER — HEPARIN (PORCINE) 25000 UT/250ML-% IV SOLN
1200.0000 [IU]/h | INTRAVENOUS | Status: DC
  Administered 2019-01-03: 1050 [IU]/h via INTRAVENOUS
  Filled 2019-01-03: qty 250

## 2019-01-03 MED ORDER — POLYETHYLENE GLYCOL 3350 17 G PO PACK: 17.0000 g | PACK | Freq: Every day | ORAL | Status: DC | PRN

## 2019-01-03 NOTE — ED Provider Notes (Signed)
[facility] EMERGENCY DEPARTMENT Provider Note   CSN: [id number] Arrival date & time: 01/03/19  1532     History   Chief Complaint Chief Complaint  Patient presents with  . Altered Mental Status  . Fall    HPI Bhavna Lomax is a 73 y.o. female.     73 year old female with past medical history below including Parkinson's disease, PVD, recurrent UTI, TIA, PE on Coumadin who presents with dysuria and multiple falls.  Patient states that she is chronically on Keflex to prevent UTIs.  She reports that recently she has had pain with urination and pain across her lower back.  She has had several falls over the past several weeks, denies any head injury but states that her hip joints hurt because of the falls.  Most recent fall was this morning onto her knees.  Son reported to EMS that patient has been more confused than normal.  She denies any fevers.  She reports generalized abdominal discomfort, no vomiting.  She had some diarrhea a few days ago that resolved.  She has a chronic intermittent cough, denies any shortness of breath.  The history is provided by the patient and a relative.  Altered Mental Status Fall    Past Medical History:  Diagnosis Date  . Arthritis   . Chronic insomnia   . Complication of anesthesia    severe itching night of surgery requiring meds- also couldnt swallow- throat "paralyzed""  . Fibromyalgia   . Glaucoma   . Hypercholesterolemia   . Hyperlipidemia   . Hypertension    PCP Dr Cari Caraway- [place]  05/15/11 with clearance and note on chart,    chest x ray, EKG 12/12 EPIC, eccho 7/11 EPIC  . Hypothyroidism    s/p graves disease  . Kidney stone   . PD (Parkinson's disease) ([place])   . Peripheral vascular disease ([place])    PULMONARY EMBOLUS x 2- 2011, 2012/ FOLLOWED BY DR [id number] 12/12 EP[facility]   PT STAES WILL STOP COUMADIN 3/12 and begin LOVONOX 05/17/11 as previously instructed  . Prediabetes   . Sinus infection    at present- dr  Honor Loh PA aware- was seen there and at PCP 05/10/11  . Thyroid disease    Hypothyroidism    Patient Active Problem List   Diagnosis Date Noted  . Pre-diabetes 08/28/2018  . Adenoma of right adrenal gland 02/20/2018  . Osteopenia 12/18/2017  . Glaucoma 10/13/2017  . Transient cerebral ischemic attack 10/13/2017  . History of prediabetes 10/13/2017  . AKI (acute kidney injury) ([place]) 02/12/2017  . Recurrent UTI 11/23/2016  . Fibromyalgia 11/23/2016  . Pulmonary HTN ([place]) 11/23/2016  . Left ventricular diastolic dysfunction 0000000  . History of pulmonary embolus (PE) 2/2 hypercoagulable state 11/23/2016  . Sepsis ([place]) 09/17/2016  . SIRS (systemic inflammatory response syndrome) ([place]) 04/29/2016  . Hyperlipidemia 04/29/2016  . Hypothyroidism 04/29/2016  . Hematuria 04/05/2016  . Benign essential HTN   . Displaced fracture of fifth cervical vertebra ([place]) 12/17/2015  . Gait disturbance 12/17/2015  . Trochanteric bursitis of both hips 09/13/2015  . Chronic low back pain 06/07/2015  . Parkinson's disease ([place]) 05/17/2015  . S/P right TKA 05/22/2011    Past Surgical History:  Procedure Laterality Date  . ABDOMINAL HYSTERECTOMY    . APPENDECTOMY    . CATARACT EXTRACTION Bilateral   . CHOLECYSTECTOMY    . COLONOSCOPY    . EXPLORATORY LAPAROTOMY    . JOINT REPLACEMENT     left knee  7/12  . TOTAL KNEE ARTHROPLASTY  05/22/2011   Procedure: TOTAL KNEE ARTHROPLASTY;  Surgeon: Mauri Pole, MD;  Location: [facility] ORS;  Service: Orthopedics;  Laterality: Right;     OB History   No obstetric history on file.      Home Medications    Prior to Admission medications   Medication Sig Start Date End Date Taking? Authorizing Provider  acetaminophen (TYLENOL) 500 MG tablet Take 500 mg by mouth every 6 (six) hours as needed for mild pain.    [provider]  amLODipine (NORVASC) 2.5 MG tablet Take 1 tablet (2.5 mg total) by mouth daily. 08/29/18   [name], [name], MD   atorvastatin (LIPITOR) 10 MG tablet Take 1 tablet (10 mg total) by mouth daily. 08/29/18   [name], [name], MD  carbidopa-levodopa (SINEMET IR) 25-100 MG tablet 2 in the morning, 1 in the afternoon, 1 in the evening 05/09/18   Tat, Rebecca S, DO  cephALEXin (KEFLEX) 250 MG capsule Take 1 capsule (250 mg total) by mouth daily. 12/11/18   [name], [name], MD  Cholecalciferol (VITAMIN D3) 5000 UNITS CAPS Take 5,000 Units by mouth daily.     [provider]  cyanocobalamin 500 MCG tablet Take 500 mcg by mouth daily.    [provider]  ketoconazole (NIZORAL) 2 % shampoo Apply 1 application topically 2 (two) times a week. 04/23/17   [name], [name], MD  levothyroxine (SYNTHROID) 100 MCG tablet Take 1 tablet (100 mcg total) by mouth daily. 10/31/18   [name], [name], MD  magnesium oxide (MAG-OX) 400 MG tablet Take 0.5 tablets (200 mg total) by mouth daily. 10/31/18   [name], [name], MD  Melatonin 5 MG TABS Take 1 tablet by mouth at bedtime as needed (for sleep).    [provider]  Menthol, Topical Analgesic, (BIOFREEZE EX) Apply 1 application topically daily as needed (for pain).    [provider]  venlafaxine XR (EFFEXOR-XR) 150 MG 24 hr capsule Take 1 capsule (150 mg total) by mouth daily with breakfast. 10/31/18   [name], [name], MD  warfarin (COUMADIN) 4 MG tablet TAKE AS DIRECTED BY COUMADIN CLINIC 08/29/18   [name], [name], MD    Family History Family History  Problem Relation Age of Onset  . Prostate cancer Father   . Stroke Father     Social History Social History   Tobacco Use  . Smoking status: Former Smoker    Packs/day: 1.00    Quit date: 05/10/1989    Years since quitting: 29.6  . Smokeless tobacco: Never Used  Substance Use Topics  . Alcohol use: Yes    Alcohol/week: 0.0 standard drinks    Comment: one drink once a month  . Drug use: No     Allergies   Crestor [rosuvastatin calcium], Erythromycin, Gabapentin,  Pregabalin, Pregabalin, Macrobid [nitrofurantoin], and Losartan   Review of Systems Review of Systems All other systems reviewed and are negative except that which was mentioned in HPI   Physical Exam Updated Vital Signs BP (!) 134/57 (BP Location: Left Arm)   Pulse 82   Temp 98.4 F (36.9 C) (Oral)   Resp 20   Physical Exam Vitals signs and nursing note reviewed.  Constitutional:      General: She is not in acute distress.    Appearance: She is well-developed. She is not ill-appearing.  HENT:     Head: Normocephalic and atraumatic.  Eyes:     Conjunctiva/sclera: Conjunctivae normal.  Pupils: Pupils are equal, round, and reactive to light.  Neck:     Musculoskeletal: Neck supple.  Cardiovascular:     Rate and Rhythm: Normal rate and regular rhythm.     Heart sounds: Normal heart sounds. No murmur.  Pulmonary:     Effort: Pulmonary effort is normal.     Breath sounds: Normal breath sounds.  Abdominal:     General: Bowel sounds are normal. There is no distension.     Palpations: Abdomen is soft.     Tenderness: There is no abdominal tenderness.  Musculoskeletal:     Comments: Pain w/ ROM b/l hips but no deformity  Skin:    General: Skin is warm and dry.  Neurological:     Mental Status: She is alert and oriented to person, place, and time.     Comments: Fluent speech  Psychiatric:     Comments: Flat affect      ED Treatments / Results  Labs (all labs ordered are listed, but only abnormal results are displayed) Labs Reviewed  URINALYSIS, ROUTINE W REFLEX MICROSCOPIC - Abnormal; Notable for the following components:      Result Value   Specific Gravity, Urine <1.005 (*)    Hgb urine dipstick LARGE (*)    Nitrite POSITIVE (*)    Leukocytes,Ua TRACE (*)    All other components within normal limits  CBC - Abnormal; Notable for the following components:   WBC 22.6 (*)    All other components within normal limits  BASIC METABOLIC PANEL - Abnormal; Notable  for the following components:   Sodium 131 (*)    Chloride 94 (*)    BUN 29 (*)    Creatinine, Ser 1.32 (*)    GFR calc non Af Amer 39 (*)    GFR calc Af Amer 46 (*)    All other components within normal limits  HEPATIC FUNCTION PANEL - Abnormal; Notable for the following components:   Albumin 3.0 (*)    Alkaline Phosphatase 131 (*)    Total Bilirubin 1.9 (*)    Bilirubin, Direct 0.6 (*)    Indirect Bilirubin 1.3 (*)    All other components within normal limits  PROTIME-INR - Abnormal; Notable for the following components:   Prothrombin Time 19.8 (*)    INR 1.7 (*)    All other components within normal limits  URINALYSIS, MICROSCOPIC (REFLEX) - Abnormal; Notable for the following components:   Bacteria, UA FEW (*)    All other components within normal limits  URINE CULTURE  CULTURE, BLOOD (ROUTINE X 2)  CULTURE, BLOOD (ROUTINE X 2)  SARS CORONAVIRUS 2 (TAT 6-24 HRS)  LACTIC ACID, PLASMA  LACTIC ACID, PLASMA  HEPARIN LEVEL (UNFRACTIONATED)  CBC  COMPREHENSIVE METABOLIC PANEL  CBC WITH DIFFERENTIAL/PLATELET    EKG None  Radiology Dg Pelvis 1-2 Views  Result Date: 01/03/2019 CLINICAL DATA:  Pt states that she has fallen "a few times over the past couple of weeks." She states she fell again today and complains of bilateral hip pain "in the joints." Pt also states "my kids tell me I have a UTI," and she says she has Parkinson's. EXAM: PELVIS - 1-2 VIEW COMPARISON:  12/17/2015 FINDINGS: No fracture or bone lesion. Hip joints are normally spaced and aligned minor marginal osteophytes from the bases of the femoral heads. No other degenerative change. SI joints and symphysis pubis are normally spaced and aligned. Skeletal structures are demineralized. Surrounding soft tissues are unremarkable. IMPRESSION: No fracture dislocation or  acute finding Electronically Signed   By: Lajean Manes M.D.   On: 01/03/2019 18:57   Ct Head Wo Contrast  Result Date: 01/03/2019 CLINICAL DATA:   Altered LOC, multiple falls EXAM: CT HEAD WITHOUT CONTRAST TECHNIQUE: Contiguous axial images were obtained from the base of the skull through the vertex without intravenous contrast. COMPARISON:  CT 02/11/2018 FINDINGS: Brain: No acute territorial infarction, hemorrhage, or intracranial mass. Mild atrophy. Stable ventricular enlargement out of proportion to atrophy. Moderate hypodensity in the white matter consistent with chronic small vessel ischemic change. Vascular: No hyperdense vessels.  Carotid vascular calcification Skull: Normal. Negative for fracture or focal lesion. Sinuses/Orbits: Mucosal thickening in the ethmoid sinuses Other: None IMPRESSION: 1. No CT evidence for acute intracranial abnormality. 2. Atrophy. Stable ventriculomegaly and moderate chronic small vessel ischemic changes of the white matter Electronically Signed   By: Donavan Foil M.D.   On: 01/03/2019 19:22    Procedures Procedures (including critical care time)  Medications Ordered in ED Medications - No data to display   Initial Impression / Assessment and Plan / ED Course  I have reviewed the triage vital signs and the nursing notes.  Pertinent labs & imaging results that were available during my care of the patient were reviewed by me and considered in my medical decision making (see chart for details).       Pt alert, non-toxic on exam, reassuring VS. lab work notable for white count of 22.6, sodium 131, creatinine 1.32, INR 1.7, UA nitrate positive with some WBCs and bacteria.  Head CT negative acute.  [place]vis x-ray negative for acute injury.  Given her urinary symptoms, leukocytosis, and altered mental status, I suspect complicated UTI especially given her history.  Chart review shows previous urine culture w/ ESBL.  Gave dose of Zosyn and discussed admission with [facility], Dr. Myna Hidalgo.  Final Clinical Impressions(s) / ED Diagnoses   Final diagnoses:  Complicated UTI (urinary tract infection)  AKI (acute kidney  injury) [facility])    ED Discharge Orders    None       Konnar Ben, Wenda Overland, MD 01/03/19 2342

## 2019-01-03 NOTE — ED Notes (Signed)
pts gown and linens changed due to urinary incontinence

## 2019-01-03 NOTE — H&P (Signed)
History and Physical    Muna Versteeg [id number] DOB: Dec 03, 1945 DOA: 01/03/2019  PCP: Darreld Mclean, MD   Patient coming from: [facility]   Chief Complaint: Dysuria, bilateral flank pain, increased confusion   HPI: [name] [name] is a 73 y.o. female with medical history significant for Parkinson's disease, recurrent UTIs on daily Keflex, history of recurrent PE on warfarin, hypothyroidism, and hypertension, now presenting to the emergency department for evaluation of dysuria, bilateral flank pain, and increased confusion.  Patient reports that she developed dysuria and bilateral flank pain 1 week ago, her daughter at the bedside states that she has been complaining of this for closer to 2 weeks.  She has had some nausea and decreased appetite associated with this, and was having loose stools couple days ago.  She reports a chronic cough but denies any shortness of breath, fevers, chills, rhinorrhea, or sore throat.  ED Course: Upon arrival to the ED, patient is found to be afebrile, saturating well on room air, and with remaining vitals also normal.  Chemistry panel is notable for sodium 131, BUN 29, and creatinine 1.32, up from 0.75 in June.  CBC features a leukocytosis to 22,600.  Lactic acid is reassuringly normal.  INR is subtherapeutic at 1.7.  Blood and urine cultures were collected in the emergency department, 1 L of IV fluids was given, patient was treated with Zosyn, and Covid testing is in process.  Review of Systems:  All other systems reviewed and apart from HPI, are negative.  Past Medical History:  Diagnosis Date  . Arthritis   . Chronic insomnia   . Complication of anesthesia    severe itching night of surgery requiring meds- also couldnt swallow- throat "paralyzed""  . Fibromyalgia   . Glaucoma   . Hypercholesterolemia   . Hyperlipidemia   . Hypertension    PCP Dr Cari Caraway- [place]  05/15/11 with clearance and note on chart,    chest x  ray, EKG 12/12 [facility], eccho 7/11 [facility]  . Hypothyroidism    s/p graves disease  . Kidney stone   . PD (Parkinson's disease) ([place])   . Peripheral vascular disease ([place])    PULMONARY EMBOLUS x 2- 2011, 2012/ FOLLOWED BY DR [id number] 12/12 [facility]   PT STAES WILL STOP COUMADIN 3/12 and begin LOVONOX 05/17/11 as previously instructed  . Prediabetes   . Sinus infection    at present- dr Honor Loh PA aware- was seen there and at PCP 05/10/11  . Thyroid disease    Hypothyroidism    Past Surgical History:  Procedure Laterality Date  . ABDOMINAL HYSTERECTOMY    . APPENDECTOMY    . CATARACT EXTRACTION Bilateral   . CHOLECYSTECTOMY    . COLONOSCOPY    . EXPLORATORY LAPAROTOMY    . JOINT REPLACEMENT     left knee  7/12  . TOTAL KNEE ARTHROPLASTY  05/22/2011   Procedure: TOTAL KNEE ARTHROPLASTY;  Surgeon: Mauri Pole, MD;  Location: [facility];  Service: Orthopedics;  Laterality: Right;     reports that she quit smoking about 29 years ago. She smoked 1.00 pack per day. She has never used smokeless tobacco. She reports current alcohol use. She reports that she does not use drugs.  Allergies  Allergen Reactions  . Crestor [Rosuvastatin Calcium] Other (See Comments)    Feeling very bad, aching all over.  . Erythromycin Hives  . Gabapentin Other (See Comments)    Blurred vision  . Pregabalin Other (See Comments)  Crossed vision.  . Pregabalin Other (See Comments)    Crossed vision. Unknown   . Macrobid [Nitrofurantoin] Hives  . Losartan Itching    Family History  Problem Relation Age of Onset  . Prostate cancer Father   . Stroke Father      Prior to Admission medications   Medication Sig Start Date End Date Taking? Authorizing Provider  acetaminophen (TYLENOL) 500 MG tablet Take 500 mg by mouth every 6 (six) hours as needed for mild pain.   Yes [provider]  amLODipine (NORVASC) 2.5 MG tablet Take 1 tablet (2.5 mg total) by mouth daily. 08/29/18  Yes [name], [name], MD   atorvastatin (LIPITOR) 10 MG tablet Take 1 tablet (10 mg total) by mouth daily. 08/29/18  Yes [name], [name], MD  carbidopa-levodopa (SINEMET IR) 25-100 MG tablet 2 in the morning, 1 in the afternoon, 1 in the evening 05/09/18  Yes Tat, Rebecca S, DO  cephALEXin (KEFLEX) 250 MG capsule Take 1 capsule (250 mg total) by mouth daily. 12/11/18  Yes [name], [name], MD  Cholecalciferol (VITAMIN D3) 5000 UNITS CAPS Take 5,000 Units by mouth daily.    Yes [provider]  cyanocobalamin 500 MCG tablet Take 500 mcg by mouth daily.   Yes [provider]  levothyroxine (SYNTHROID) 100 MCG tablet Take 1 tablet (100 mcg total) by mouth daily. 10/31/18  Yes [name], [name], MD  magnesium oxide (MAG-OX) 400 MG tablet Take 0.5 tablets (200 mg total) by mouth daily. 10/31/18  Yes [name], [name], MD  Melatonin 5 MG TABS Take 1 tablet by mouth at bedtime as needed (for sleep).   Yes [provider]  Menthol, Topical Analgesic, (BIOFREEZE EX) Apply 1 application topically daily as needed (for pain).   Yes [provider]  venlafaxine XR (EFFEXOR-XR) 150 MG 24 hr capsule Take 1 capsule (150 mg total) by mouth daily with breakfast. 10/31/18  Yes [name], [name], MD  warfarin (COUMADIN) 4 MG tablet TAKE AS DIRECTED BY COUMADIN CLINIC 08/29/18  Yes [name], [name], MD  ketoconazole (NIZORAL) 2 % shampoo Apply 1 application topically 2 (two) times a week. 04/23/17   CoplandGay Filler, MD    Physical Exam: Vitals:   01/03/19 1541 01/03/19 1815 01/03/19 1830 01/03/19 2030  BP: (!) 134/57 (!) 115/52  114/61  Pulse: 82 74  69  Resp: 20   18  Temp: 98.4 F (36.9 C)     TempSrc: Oral     SpO2:  97%  96%  Weight:   79.4 kg   Height:   5\' 6"  (1.676 m)     Constitutional: NAD, calm  Eyes: PERTLA, lids and conjunctivae normal ENMT: Mucous membranes are moist. Posterior pharynx clear of any exudate or lesions.   Neck: normal, supple, no masses, no thyromegaly  Respiratory: no wheezing, no crackles. Normal respiratory effort. No accessory muscle use.  Cardiovascular: S1 & S2 heard, regular rate and rhythm. No extremity edema.   Abdomen: No distension, no tenderness, soft. Bowel sounds active.  Musculoskeletal: no clubbing / cyanosis. No joint deformity upper and lower extremities.  Skin: no significant rashes, lesions, ulcers. Warm, dry, well-perfused. Neurologic: No gross facial asymmetry. Sensation intact. Moving all extremities.  Psychiatric:  Alert and oriented to person, place, and situation. Calm, cooperative.     Labs on Admission: I have personally reviewed following labs and imaging studies  CBC: Recent Labs  Lab 01/03/19 1555  WBC 22.6*  HGB 13.8  HCT  41.5  MCV 88.9  PLT [id number]   Basic Metabolic Panel: Recent Labs  Lab 01/03/19 1555  NA 131*  K 4.5  CL 94*  CO2 25  GLUCOSE 94  BUN 29*  CREATININE 1.32*  CALCIUM 9.3   GFR: Estimated Creatinine Clearance: 39.1 mL/min (A) (by C-G formula based on SCr of 1.32 mg/dL (H)). Liver Function Tests: Recent Labs  Lab 01/03/19 1747  AST 38  ALT 5  ALKPHOS 131*  BILITOT 1.9*  PROT 7.0  ALBUMIN 3.0*   No results for input(s): LIPASE, AMYLASE in the last 168 hours. No results for input(s): AMMONIA in the last 168 hours. Coagulation Profile: Recent Labs  Lab 01/03/19 1747  INR 1.7*   Cardiac Enzymes: No results for input(s): CKTOTAL, CKMB, CKMBINDEX, TROPONINI in the last 168 hours. BNP (last 3 results) No results for input(s): PROBNP in the last 8760 hours. HbA1C: No results for input(s): HGBA1C in the last 72 hours. CBG: No results for input(s): GLUCAP in the last 168 hours. Lipid Profile: No results for input(s): CHOL, HDL, LDLCALC, TRIG, CHOLHDL, LDLDIRECT in the last 72 hours. Thyroid Function Tests: No results for input(s): TSH, T4TOTAL, FREET4, T3FREE, THYROIDAB in the last 72 hours. Anemia Panel: No results for input(s): VITAMINB12, FOLATE, FERRITIN, TIBC,  IRON, RETICCTPCT in the last 72 hours. Urine analysis:    Component Value Date/Time   COLORURINE YELLOW 01/03/2019 1752   APPEARANCEUR CLEAR 01/03/2019 1752   LABSPEC <1.005 (L) 01/03/2019 1752   PHURINE 6.0 01/03/2019 1752   GLUCOSEU NEGATIVE 01/03/2019 1752   GLUCOSEU NEGATIVE 08/14/2017 1541   HGBUR LARGE (A) 01/03/2019 1752   BILIRUBINUR NEGATIVE 01/03/2019 1752   BILIRUBINUR 3+ 10/02/2017 1031   [place] 01/03/2019 1752   PROTEINUR NEGATIVE 01/03/2019 1752   UROBILINOGEN 4.0 (A) 10/02/2017 1031   UROBILINOGEN 0.2 08/14/2017 1541   NITRITE POSITIVE (A) 01/03/2019 1752   LEUKOCYTESUR TRACE (A) 01/03/2019 1752   Sepsis Labs: @LABRCNTIP (procalcitonin:4,lacticidven:4) )No results found for this or any previous visit (from the past 240 hour(s)).   Radiological Exams on Admission: Dg Pelvis 1-2 Views  Result Date: 01/03/2019 CLINICAL DATA:  Pt states that she has fallen "a few times over the past couple of weeks." She states she fell again today and complains of bilateral hip pain "in the joints." Pt also states "my kids tell me I have a UTI," and she says she has Parkinson's. EXAM: PELVIS - 1-2 VIEW COMPARISON:  12/17/2015 FINDINGS: No fracture or bone lesion. Hip joints are normally spaced and aligned minor marginal osteophytes from the bases of the femoral heads. No other degenerative change. SI joints and symphysis pubis are normally spaced and aligned. Skeletal structures are demineralized. Surrounding soft tissues are unremarkable. IMPRESSION: No fracture dislocation or acute finding Electronically Signed   By: Lajean Manes M.D.   On: 01/03/2019 18:57   Ct Head Wo Contrast  Result Date: 01/03/2019 CLINICAL DATA:  Altered LOC, multiple falls EXAM: CT HEAD WITHOUT CONTRAST TECHNIQUE: Contiguous axial images were obtained from the base of the skull through the vertex without intravenous contrast. COMPARISON:  CT 02/11/2018 FINDINGS: Brain: No acute territorial infarction,  hemorrhage, or intracranial mass. Mild atrophy. Stable ventricular enlargement out of proportion to atrophy. Moderate hypodensity in the white matter consistent with chronic small vessel ischemic change. Vascular: No hyperdense vessels.  Carotid vascular calcification Skull: Normal. Negative for fracture or focal lesion. Sinuses/Orbits: Mucosal thickening in the ethmoid sinuses Other: None IMPRESSION: 1. No CT evidence for acute intracranial abnormality.  2. Atrophy. Stable ventriculomegaly and moderate chronic small vessel ischemic changes of the white matter Electronically Signed   By: Donavan Foil M.D.   On: 01/03/2019 19:22    EKG: Not performed.   Assessment/Plan   1. UTI  - Patient is on Kefelx chronically for recurrent UTI's and presents with increased confusion, dysuria, and back pain, and she is found to have leukocytosis and UA compatible with infection  - She has hx of ESBL in urine  - Blood and urine cultures collected in ED  - Start meropenem, follow cultures and clinical course    2. Acute encephalopathy  - Presents with several days of increased confusion, dysuria, back pain  - Likely secondary to UTI, no acute findings on head CT  - Treat underlying cause, expand workup if fails to improve as expected with treatment of UTI    3. AKI  - SCr is 1.32 on admission, up from 0.75 in June 2020  - Likely prerenal azotemia in setting of decreased appetite  - She was given a liter of IVF in ED and will be continued on NS infusion overnight  - Renally-dose medications, avoid nephrotoxins, and repeat chem panel in am   4. Hyponatremia  - Serum sodium is 131 in setting of hypovolemia  - She was given a liter of IVF in ED and will be continued on NS infusion overnight  - Repeat chem panel in am    5. Parkinson's disease  - Continue Sinemet    6. Hypothyroidism  - Continue Synthroid   7. History of recurrent PE  - INR is 1.7 in ED, no evidence for acute VTE  - Continue warfarin  with pharmacy assistance, bridge with Lovenox    PPE: mask, face shield  DVT prophylaxis: warfarin, Lovenox while INR subtherapeutic  Code Status: DNR, confirmed with family  Family Communication: Daughter updated at bedside Consults called: None  Admission status: Observation     Vianne Bulls, MD [facility] Pager [phone]  If 7PM-7AM, please contact night-coverage [email or web address] Password [email or web address]  01/03/2019, 8:44 PM

## 2019-01-03 NOTE — ED Triage Notes (Addendum)
Pt bib ems from [place] [facility],  with urinary symptoms. Complains of back pain and dysuria. Pt also with multiple falls in the last few weeks. Fell this morning onto her knees. No deformity noted. Pt is on coumadin. Per ems, son states pt seems more confused than normal. Son also states that pt with hx of urosepsis. CBG 114, 97.9, 124/62, 74 HR.

## 2019-01-03 NOTE — Plan of Care (Signed)
  Problem: Education: Goal: Knowledge of General Education information will improve Description Including pain rating scale, medication(s)/side effects and non-pharmacologic comfort measures Outcome: Progressing   

## 2019-01-03 NOTE — Progress Notes (Signed)
ANTICOAGULATION CONSULT NOTE - Follow Up Consult  Pharmacy Consult for Heparin + Coumadin Indication: Hx recurrent PE  Allergies  Allergen Reactions  . Crestor [Rosuvastatin Calcium] Other (See Comments)    Feeling very bad, aching all over.  . Erythromycin Hives  . Gabapentin Other (See Comments)    Blurred vision  . Pregabalin Other (See Comments)    Crossed vision.  . Pregabalin Other (See Comments)    Crossed vision. Unknown   . Macrobid [Nitrofurantoin] Hives  . Losartan Itching    Patient Measurements: Height: 5\' 6"  (167.6 cm) Weight: 175 lb (79.4 kg) IBW/kg (Calculated) : 59.3 Heparin Dosing Weight: 75 kg  Vital Signs: Temp: 98.4 F (36.9 C) (10/30 1541) Temp Source: Oral (10/30 1541) BP: 114/61 (10/30 2030) Pulse Rate: 69 (10/30 2030)  Labs: Recent Labs    01/03/19 1555 01/03/19 1747  HGB 13.8  --   HCT 41.5  --   PLT 200  --   LABPROT  --  19.8*  INR  --  1.7*  CREATININE 1.32*  --     Estimated Creatinine Clearance: 39.1 mL/min (A) (by C-G formula based on SCr of 1.32 mg/dL (H)).   Medications:  Infusions:  . heparin    . meropenem (MERREM) IV      Assessment: 73 yo female on chronic Coumadin for hx recurrent PE admitted with subtherapeutic INR.  Pharmacy asked to bridge with IV heparin and continue Coumadin.  Goal of Therapy:  INR 2-3 Heparin level 0.3-0.7 units/ml Monitor platelets by anticoagulation protocol: Yes   Plan:  1. Start IV Heparin 1050 units/hr. 2. Check heparin level 8 hrs after gtt starts. 3. Coumadin 6 mg po x 1 tonight. 4. Daily heparin level, INR and CBC.  Marguerite Olea, [facility] Clinical Pharmacist Phone [phone]  01/03/2019 9:15 PM

## 2019-01-03 NOTE — Progress Notes (Signed)
Pharmacy Antibiotic Note  [name] [name] is a 73 y.o. female admitted on 01/03/2019 with UTI, hx ESBL.  Pharmacy has been consulted for meropenem dosing.  Plan: 1. Meropenem 1g q 12 hrs. 2. F/u renal function, cultures and clinical course.  Height: 5\' 6"  (167.6 cm) Weight: 175 lb (79.4 kg) IBW/kg (Calculated) : 59.3  Temp (24hrs), Avg:98.4 F (36.9 C), Min:98.4 F (36.9 C), Max:98.4 F (36.9 C)  Recent Labs  Lab 01/03/19 1555 01/03/19 1820 01/03/19 2032  WBC 22.6*  --   --   CREATININE 1.32*  --   --   LATICACIDVEN  --  1.5 1.3    Estimated Creatinine Clearance: 39.1 mL/min (A) (by C-G formula based on SCr of 1.32 mg/dL (H)).    Allergies  Allergen Reactions  . Crestor [Rosuvastatin Calcium] Other (See Comments)    Feeling very bad, aching all over.  . Erythromycin Hives  . Gabapentin Other (See Comments)    Blurred vision  . Pregabalin Other (See Comments)    Crossed vision.  . Pregabalin Other (See Comments)    Crossed vision. Unknown   . Macrobid [Nitrofurantoin] Hives  . Losartan Itching   Antimicrobials this admission:  Meropenem 10/30 >   Dose adjustments this admission:   Microbiology results:  10/30 BCx x 2 >  10/30 UCx > 10/30 COVID >   Thank you for allowing pharmacy to be a part of this patient's care.  Marguerite Olea, [facility] Clinical Pharmacist Phone [phone]  01/03/2019 9:13 PM

## 2019-01-03 NOTE — Progress Notes (Signed)
New Admission Note: ? Arrival Method: via stretcher  Mental Orientation: A/O x 4 Telemetry: none Assessment: Completed Skin: Refer to flowsheet IV: Right AC Pain: 0/10 Tubes: Safety Measures: Safety Fall Prevention Plan discussed with patient. Admission: Completed [place] Orientation: Patient has been orientated to the room, unit and the staff. Family: Orders have been reviewed and are being implemented. Will continue to monitor the patient. Call light has been placed within reach and bed alarm has been activated.  ? [employer], [place]

## 2019-01-04 DIAGNOSIS — G9341 Metabolic encephalopathy: Secondary | ICD-10-CM | POA: Diagnosis present

## 2019-01-04 DIAGNOSIS — Z1612 Extended spectrum beta lactamase (ESBL) resistance: Secondary | ICD-10-CM | POA: Diagnosis present

## 2019-01-04 DIAGNOSIS — D6859 Other primary thrombophilia: Secondary | ICD-10-CM | POA: Diagnosis present

## 2019-01-04 DIAGNOSIS — G9340 Encephalopathy, unspecified: Secondary | ICD-10-CM | POA: Diagnosis present

## 2019-01-04 DIAGNOSIS — M0689 Other specified rheumatoid arthritis, multiple sites: Secondary | ICD-10-CM | POA: Diagnosis present

## 2019-01-04 DIAGNOSIS — Z7901 Long term (current) use of anticoagulants: Secondary | ICD-10-CM | POA: Diagnosis not present

## 2019-01-04 DIAGNOSIS — Z86711 Personal history of pulmonary embolism: Secondary | ICD-10-CM | POA: Diagnosis not present

## 2019-01-04 DIAGNOSIS — M4692 Unspecified inflammatory spondylopathy, cervical region: Secondary | ICD-10-CM | POA: Diagnosis present

## 2019-01-04 DIAGNOSIS — E7800 Pure hypercholesterolemia, unspecified: Secondary | ICD-10-CM | POA: Diagnosis present

## 2019-01-04 DIAGNOSIS — F5104 Psychophysiologic insomnia: Secondary | ICD-10-CM | POA: Diagnosis present

## 2019-01-04 DIAGNOSIS — H409 Unspecified glaucoma: Secondary | ICD-10-CM | POA: Diagnosis present

## 2019-01-04 DIAGNOSIS — Z87442 Personal history of urinary calculi: Secondary | ICD-10-CM | POA: Diagnosis not present

## 2019-01-04 DIAGNOSIS — I5032 Chronic diastolic (congestive) heart failure: Secondary | ICD-10-CM | POA: Diagnosis present

## 2019-01-04 DIAGNOSIS — R7303 Prediabetes: Secondary | ICD-10-CM | POA: Diagnosis present

## 2019-01-04 DIAGNOSIS — N179 Acute kidney failure, unspecified: Secondary | ICD-10-CM | POA: Diagnosis present

## 2019-01-04 DIAGNOSIS — M797 Fibromyalgia: Secondary | ICD-10-CM | POA: Diagnosis present

## 2019-01-04 DIAGNOSIS — Z87440 Personal history of urinary (tract) infections: Secondary | ICD-10-CM | POA: Diagnosis not present

## 2019-01-04 DIAGNOSIS — Z881 Allergy status to other antibiotic agents status: Secondary | ICD-10-CM | POA: Diagnosis not present

## 2019-01-04 DIAGNOSIS — Z8619 Personal history of other infectious and parasitic diseases: Secondary | ICD-10-CM | POA: Diagnosis not present

## 2019-01-04 DIAGNOSIS — Z96651 Presence of right artificial knee joint: Secondary | ICD-10-CM | POA: Diagnosis present

## 2019-01-04 DIAGNOSIS — R5381 Other malaise: Secondary | ICD-10-CM | POA: Diagnosis not present

## 2019-01-04 DIAGNOSIS — Z66 Do not resuscitate: Secondary | ICD-10-CM | POA: Diagnosis present

## 2019-01-04 DIAGNOSIS — I10 Essential (primary) hypertension: Secondary | ICD-10-CM | POA: Diagnosis present

## 2019-01-04 DIAGNOSIS — Z20828 Contact with and (suspected) exposure to other viral communicable diseases: Secondary | ICD-10-CM | POA: Diagnosis present

## 2019-01-04 DIAGNOSIS — Z743 Need for continuous supervision: Secondary | ICD-10-CM | POA: Diagnosis not present

## 2019-01-04 DIAGNOSIS — M1990 Unspecified osteoarthritis, unspecified site: Secondary | ICD-10-CM | POA: Diagnosis present

## 2019-01-04 DIAGNOSIS — N390 Urinary tract infection, site not specified: Secondary | ICD-10-CM | POA: Diagnosis not present

## 2019-01-04 DIAGNOSIS — Z888 Allergy status to other drugs, medicaments and biological substances status: Secondary | ICD-10-CM | POA: Diagnosis not present

## 2019-01-04 DIAGNOSIS — E871 Hypo-osmolality and hyponatremia: Secondary | ICD-10-CM | POA: Diagnosis present

## 2019-01-04 DIAGNOSIS — E785 Hyperlipidemia, unspecified: Secondary | ICD-10-CM | POA: Diagnosis present

## 2019-01-04 DIAGNOSIS — G20 Parkinson's disease: Secondary | ICD-10-CM | POA: Diagnosis present

## 2019-01-04 DIAGNOSIS — E039 Hypothyroidism, unspecified: Secondary | ICD-10-CM | POA: Diagnosis present

## 2019-01-04 DIAGNOSIS — R279 Unspecified lack of coordination: Secondary | ICD-10-CM | POA: Diagnosis not present

## 2019-01-04 HISTORY — DX: Metabolic encephalopathy: G93.41

## 2019-01-04 LAB — CBC WITH DIFFERENTIAL/PLATELET
Abs Immature Granulocytes: 0.08 10*3/uL — ABNORMAL HIGH (ref 0.00–0.07)
Basophils Absolute: 0.1 10*3/uL (ref 0.0–0.1)
Basophils Relative: 0 %
Eosinophils Absolute: 0.1 10*3/uL (ref 0.0–0.5)
Eosinophils Relative: 1 %
HCT: 37.8 % (ref 36.0–46.0)
Hemoglobin: 12.4 g/dL (ref 12.0–15.0)
Immature Granulocytes: 1 %
Lymphocytes Relative: 6 %
Lymphs Abs: 1 10*3/uL (ref 0.7–4.0)
MCH: 29 pg (ref 26.0–34.0)
MCHC: 32.8 g/dL (ref 30.0–36.0)
MCV: 88.5 fL (ref 80.0–100.0)
Monocytes Absolute: 1.5 10*3/uL — ABNORMAL HIGH (ref 0.1–1.0)
Monocytes Relative: 10 %
Neutro Abs: 12.5 10*3/uL — ABNORMAL HIGH (ref 1.7–7.7)
Neutrophils Relative %: 82 %
Platelets: 220 10*3/uL (ref 150–400)
RBC: 4.27 MIL/uL (ref 3.87–5.11)
RDW: 12.7 % (ref 11.5–15.5)
WBC: 15.2 10*3/uL — ABNORMAL HIGH (ref 4.0–10.5)
nRBC: 0 % (ref 0.0–0.2)

## 2019-01-04 LAB — COMPREHENSIVE METABOLIC PANEL
ALT: 5 U/L (ref 0–44)
AST: 41 U/L (ref 15–41)
Albumin: 2.3 g/dL — ABNORMAL LOW (ref 3.5–5.0)
Alkaline Phosphatase: 103 U/L (ref 38–126)
Anion gap: 13 (ref 5–15)
BUN: 26 mg/dL — ABNORMAL HIGH (ref 8–23)
CO2: 22 mmol/L (ref 22–32)
Calcium: 8.5 mg/dL — ABNORMAL LOW (ref 8.9–10.3)
Chloride: 99 mmol/L (ref 98–111)
Creatinine, Ser: 1.08 mg/dL — ABNORMAL HIGH (ref 0.44–1.00)
GFR calc Af Amer: 58 mL/min — ABNORMAL LOW (ref >60)
GFR calc non Af Amer: 50 mL/min — ABNORMAL LOW (ref >60)
Glucose, Bld: 85 mg/dL (ref 70–99)
Potassium: 3.3 mmol/L — ABNORMAL LOW (ref 3.5–5.1)
Sodium: 134 mmol/L — ABNORMAL LOW (ref 135–145)
Total Bilirubin: 1.1 mg/dL (ref 0.3–1.2)
Total Protein: 5.6 g/dL — ABNORMAL LOW (ref 6.5–8.1)

## 2019-01-04 LAB — SARS CORONAVIRUS 2 (TAT 6-24 HRS): SARS Coronavirus 2: NEGATIVE

## 2019-01-04 LAB — PROTIME-INR
INR: 2.1 — ABNORMAL HIGH (ref 0.8–1.2)
Prothrombin Time: 22.9 seconds — ABNORMAL HIGH (ref 11.4–15.2)

## 2019-01-04 LAB — HEPARIN LEVEL (UNFRACTIONATED): Heparin Unfractionated: <0.1 IU/mL — ABNORMAL LOW (ref 0.30–0.70)

## 2019-01-04 MED ORDER — ADULT MULTIVITAMIN W/MINERALS CH
1.0000 | ORAL_TABLET | Freq: Every day | ORAL | Status: DC
  Administered 2019-01-04 – 2019-01-07 (×4): 1 via ORAL
  Filled 2019-01-04 (×4): qty 1

## 2019-01-04 MED ORDER — WARFARIN SODIUM 6 MG PO TABS
6.0000 mg | ORAL_TABLET | Freq: Once | ORAL | Status: AC
  Administered 2019-01-04: 6 mg via ORAL
  Filled 2019-01-04: qty 1

## 2019-01-04 MED ORDER — ENSURE ENLIVE PO LIQD
237.0000 mL | Freq: Two times a day (BID) | ORAL | Status: DC
  Administered 2019-01-04 – 2019-01-07 (×7): 237 mL via ORAL

## 2019-01-04 MED ORDER — POTASSIUM CHLORIDE CRYS ER 20 MEQ PO TBCR
40.0000 meq | EXTENDED_RELEASE_TABLET | ORAL | Status: AC
  Administered 2019-01-04 (×2): 40 meq via ORAL
  Filled 2019-01-04 (×2): qty 2

## 2019-01-04 NOTE — Progress Notes (Signed)
PROGRESS NOTE    [name] [name]  [id number] DOB: [birth year] DOA: 01/03/2019 PCP: Darreld Mclean, MD   Brief Narrative:  [name] [name] is a 73 y.o. female with medical history significant for Parkinson's disease, recurrent UTIs on daily Keflex, history of recurrent PE on warfarin, hypothyroidism, and hypertension who presented to ED for evaluation of dysuria, bilateral flank pain, and increased confusion.  Flank pain and dysuria has been going on for 1 to 2 weeks associated with some nausea and decreased appetite and some loose stools.  She reports chronic cough but no fever or shortness of breath.  Upon arrival to the ED, patient is found to be afebrile, saturating well on room air, and with remaining vitals also normal.  Chemistry panel is notable for sodium 131, BUN 29, and creatinine 1.32, up from 0.75 in June.  CBC features a leukocytosis to 22,600.  Lactic acid is reassuringly normal.  INR was subtherapeutic at 1.7.  Blood and urine cultures were collected in the emergency department, 1 L of IV fluids was given, patient was treated with Zosyn, and Covid testing was negative.  She was admitted by hospital service for possible UTI causing acute encephalopathy.  Assessment & Plan:   Principal Problem:   Complicated UTI (urinary tract infection) Active Problems:   Parkinson's disease ([place])   Benign essential HTN   Hypothyroidism   History of pulmonary embolus (PE) 2/2 hypercoagulable state   AKI (acute kidney injury) ([place])   Subtherapeutic international normalized ratio (INR)   Hyponatremia   UTI: UA positive with leukoesterase and nitrites.  History of ESBL infection in the past.  Continue to treat Merrem and wait for final cultures.  Acute metabolic encephalopathy: Likely due to UTI.  Patient is completely alert and oriented on my evaluation today.  Treat underlying infection.  AKI: Creatinine improved to 1.08.  We will continue gentle hydration.   Repeat la in the morning.   Hypokalemia: 3.3.  Replace orally and recheck in the morning.  Hypovolemic hyponatremia: Sodium improved from 131 to 134.  Continue gentle hydration.  Generalized weakness: We will consult PT OT to assess.  Parkinson's disease: Continue Sinemet  Hypothyroidism: Continue Synthroid.  History of recurrent PE: On Coumadin.  INR now therapeutic.  Discontinue heparin.  DVT prophylaxis: Coumadin Code Status: DNR Family Communication:  None present at bedside.  Plan of care discussed with patient in length and he verbalized understanding and agreed with it. Disposition Plan: Potential discharge tomorrow.  Estimated body mass index is 28.25 kg/m as calculated from the following:   Height as of this encounter: 5\' 6"  (1.676 m).   Weight as of this encounter: 79.4 kg.      Nutritional status:               Consultants:   None  Procedures:   None  Antimicrobials:   Merrem   Subjective: Seen and examined.  Continues to complain of generalized body ache.  Did not complain of flank pain or any other complaint.  Alert and oriented.  Objective: Vitals:   01/03/19 2115 01/03/19 2218 01/04/19 0543 01/04/19 0900  BP: (!) 115/58 130/69 (!) 115/52 115/60  Pulse: 63 74 80 82  Resp:  18 18 18   Temp:  98.5 F (36.9 C) 98.5 F (36.9 C) 98.3 F (36.8 C)  TempSrc:  Oral Oral Oral  SpO2: 96% 100% 92% 92%  Weight:      Height:        Intake/Output Summary (Last 24 hours)  at 01/04/2019 1317 Last data filed at 01/04/2019 1130 Gross per 24 hour  Intake 1419.9 ml  Output 1001 ml  Net 418.9 ml   Filed Weights   01/03/19 1830  Weight: 79.4 kg    Examination:  General exam: Appears calm and comfortable  Respiratory system: Clear to auscultation. Respiratory effort normal. Cardiovascular system: S1 & S2 heard, RRR. No JVD, murmurs, rubs, gallops or clicks. No pedal edema. Gastrointestinal system: Abdomen is nondistended, soft and nontender. No organomegaly or masses  felt. Normal bowel sounds heard. Central nervous system: Alert and oriented. No focal neurological deficits. Extremities: Symmetric 5 x 5 power. Skin: No rashes, lesions or ulcers Psychiatry: Judgement and insight appear normal. Mood & affect appropriate.    Data Reviewed: I have personally reviewed following labs and imaging studies  CBC: Recent Labs  Lab 01/03/19 1555 01/04/19 0601  WBC 22.6* 15.2*  NEUTROABS  --  12.5*  HGB 13.8 12.4  HCT 41.5 37.8  MCV 88.9 88.5  PLT 200 [id number]   Basic Metabolic Panel: Recent Labs  Lab 01/03/19 1555 01/04/19 0601  NA 131* 134*  K 4.5 3.3*  CL 94* 99  CO2 25 22  GLUCOSE 94 85  BUN 29* 26*  CREATININE 1.32* 1.08*  CALCIUM 9.3 8.5*   GFR: Estimated Creatinine Clearance: 47.8 mL/min (A) (by C-G formula based on SCr of 1.08 mg/dL (H)). Liver Function Tests: Recent Labs  Lab 01/03/19 1747 01/04/19 0601  AST 38 41  ALT 5 5  ALKPHOS 131* 103  BILITOT 1.9* 1.1  PROT 7.0 5.6*  ALBUMIN 3.0* 2.3*   No results for input(s): LIPASE, AMYLASE in the last 168 hours. No results for input(s): AMMONIA in the last 168 hours. Coagulation Profile: Recent Labs  Lab 01/03/19 1747 01/04/19 0601  INR 1.7* 2.1*   Cardiac Enzymes: No results for input(s): CKTOTAL, CKMB, CKMBINDEX, TROPONINI in the last 168 hours. BNP (last 3 results) No results for input(s): PROBNP in the last 8760 hours. HbA1C: No results for input(s): HGBA1C in the last 72 hours. CBG: No results for input(s): GLUCAP in the last 168 hours. Lipid Profile: No results for input(s): CHOL, HDL, LDLCALC, TRIG, CHOLHDL, LDLDIRECT in the last 72 hours. Thyroid Function Tests: No results for input(s): TSH, T4TOTAL, FREET4, T3FREE, THYROIDAB in the last 72 hours. Anemia Panel: No results for input(s): VITAMINB12, FOLATE, FERRITIN, TIBC, IRON, RETICCTPCT in the last 72 hours. Sepsis Labs: Recent Labs  Lab 01/03/19 1820 01/03/19 2032  LATICACIDVEN 1.5 1.3    Recent Results  (from the past 240 hour(s))  Culture, blood (routine x 2)     Status: None (Preliminary result)   Collection Time: 01/03/19  6:31 PM   Specimen: BLOOD  Result Value Ref Range Status   Specimen Description BLOOD RIGHT ANTECUBITAL  Final   Special Requests   Final    BOTTLES DRAWN AEROBIC AND ANAEROBIC Blood Culture results may not be optimal due to an inadequate volume of blood received in culture bottles   Culture   Final    NO GROWTH < 24 HOURS Performed at [facility] [address], [place], [place] [address]    Report Status PENDING  Incomplete  SARS CORONAVIRUS 2 (TAT 6-24 HRS) Nasopharyngeal Nasopharyngeal Swab     Status: None   Collection Time: 01/03/19  8:23 PM   Specimen: Nasopharyngeal Swab  Result Value Ref Range Status   SARS Coronavirus 2 NEGATIVE NEGATIVE Final    Comment: (NOTE) SARS-CoV-2 target nucleic acids  are NOT DETECTED. The SARS-CoV-2 RNA is generally detectable in upper and lower respiratory specimens during the acute phase of infection. Negative results do not preclude SARS-CoV-2 infection, do not rule out co-infections with other pathogens, and should not be used as the sole basis for treatment or other patient management decisions. Negative results must be combined with clinical observations, patient history, and epidemiological information. The expected result is Negative. Fact Sheet for Patients: SugarRoll.be Fact Sheet for Healthcare Providers: https://www.woods-mathews.com/ This test is not yet approved or cleared by the [place] FDA and  has been authorized for detection and/or diagnosis of SARS-CoV-2 by FDA under an Emergency Use Authorization (EUA). This EUA will remain  in effect (meaning this test can be used) for the duration of the COVID-19 declaration under Section 56 4(b)(1) of the Act, 21 U.S.C. section 360bbb-3(b)(1), unless the authorization is terminated or revoked sooner.  Performed at [facility], [place] [address], [place], [place] [address]   Culture, blood (routine x 2)     Status: None (Preliminary result)   Collection Time: 01/03/19  9:08 PM   Specimen: Site Not Specified; Blood  Result Value Ref Range Status   Specimen Description SITE NOT SPECIFIED  Final   Special Requests   Final    BOTTLES DRAWN AEROBIC AND ANAEROBIC Blood Culture adequate volume   Culture   Final    NO GROWTH < 12 HOURS Performed at [facility], [place] [address], [place], [place] [address]    Report Status PENDING  Incomplete      Radiology Studies: Dg Pelvis 1-2 Views  Result Date: 01/03/2019 CLINICAL DATA:  Pt states that she has fallen "a few times over the past couple of weeks." She states she fell again today and complains of bilateral hip pain "in the joints." Pt also states "my kids tell me I have a UTI," and she says she has Parkinson's. EXAM: PELVIS - 1-2 VIEW COMPARISON:  12/17/2015 FINDINGS: No fracture or bone lesion. Hip joints are normally spaced and aligned minor marginal osteophytes from the bases of the femoral heads. No other degenerative change. SI joints and symphysis pubis are normally spaced and aligned. Skeletal structures are demineralized. Surrounding soft tissues are unremarkable. IMPRESSION: No fracture dislocation or acute finding Electronically Signed   By: Lajean Manes M.D.   On: 01/03/2019 18:57   Ct Head Wo Contrast  Result Date: 01/03/2019 CLINICAL DATA:  Altered LOC, multiple falls EXAM: CT HEAD WITHOUT CONTRAST TECHNIQUE: Contiguous axial images were obtained from the base of the skull through the vertex without intravenous contrast. COMPARISON:  CT 02/11/2018 FINDINGS: Brain: No acute territorial infarction, hemorrhage, or intracranial mass. Mild atrophy. Stable ventricular enlargement out of proportion to atrophy. Moderate hypodensity in the white matter consistent with chronic small vessel ischemic change. Vascular: No hyperdense  vessels.  Carotid vascular calcification Skull: Normal. Negative for fracture or focal lesion. Sinuses/Orbits: Mucosal thickening in the ethmoid sinuses Other: None IMPRESSION: 1. No CT evidence for acute intracranial abnormality. 2. Atrophy. Stable ventriculomegaly and moderate chronic small vessel ischemic changes of the white matter Electronically Signed   By: Donavan Foil M.D.   On: 01/03/2019 19:22    Scheduled Meds: . atorvastatin  10 mg Oral q1800  . carbidopa-levodopa  1 tablet Oral 2 times per day  . carbidopa-levodopa  2 tablet Oral Q breakfast  . levothyroxine  100 mcg Oral Daily  . potassium chloride  40 mEq Oral Q4H  . venlafaxine XR  150 mg Oral Q breakfast  . Warfarin - Pharmacist Dosing Inpatient   Does not apply q1800   Continuous Infusions: . meropenem (MERREM) IV 1 g (01/04/19 1017)     LOS: 0 days   Time spent: 32 minutes   Darliss Cheney, MD [facility]  01/04/2019, 1:17 PM   To contact the attending provider between 7A-7P or the covering provider during after hours 7P-7A, please log into the web site [email or web address] and use password [email or web address].

## 2019-01-04 NOTE — Evaluation (Signed)
Physical Therapy Evaluation Patient Details Name: [name] [name] MRN: [id number] DOB: October 01, 1945 Today's Date: 01/04/2019   History of Present Illness  [name] [name] is a 73 y.o. female with medical history significant for Parkinson's disease, recurrent UTIs on daily Keflex, history of recurrent PE on warfarin, hypothyroidism, and hypertension who presented to ED for evaluation of dysuria, bilateral flank pain, and increased confusion.  Flank pain and dysuria has been going on for 1 to 2 weeks associated with some nausea and decreased appetite and some loose stools.  She reports chronic cough but no fever or shortness of breath.  Admit for UTI and encephalopathy.   Clinical Impression  Pt admitted with above diagnosis. Pt was able to ambulate with min to mod assist due to incr cues and assist for proximity to RW.  Pt with weakness and will need ST SNF prior to d/c back to A living.  Will follow acutely.  Pt currently with functional limitations due to the deficits listed below (see PT Problem List). Pt will benefit from skilled PT to increase their independence and safety with mobility to allow discharge to the venue listed below.      Follow Up Recommendations SNF;Supervision/Assistance - 24 hour    Equipment Recommendations  None recommended by PT    Recommendations for Other Services       Precautions / Restrictions Precautions Precautions: Fall Restrictions Weight Bearing Restrictions: No      Mobility  Bed Mobility Overal bed mobility: Needs Assistance Bed Mobility: Supine to Sit     Supine to sit: Mod assist     General bed mobility comments: assist for LEs and elevation of trunk taking incr time to come to EOB and use of pad  Transfers Overall transfer level: Needs assistance Equipment used: Rolling walker (2 wheeled) Transfers: Sit to/from Stand Sit to Stand: Mod assist;From elevated surface         General transfer comment: Needed assist to power up and steady  once up. Cues for hand placement  Ambulation/Gait Ambulation/Gait assistance: Min assist;Mod assist Gait Distance (Feet): 20 Feet Assistive device: Rolling walker (2 wheeled) Gait Pattern/deviations: Decreased stride length;Step-to pattern;Decreased step length - right;Decreased step length - left;Shuffle;Trunk flexed;Wide base of support   Gait velocity interpretation: <1.31 ft/sec, indicative of household ambulator General Gait Details: Pt with Parkinsons gait. Difficulty intiiating steps and then festinating gait.  Needed cues to stay close to rW, to steer rw andsequence steps and RW.   Stairs            Wheelchair Mobility    Modified Rankin (Stroke Patients Only)       Balance Overall balance assessment: Needs assistance Sitting-balance support: No upper extremity supported;Feet supported Sitting balance-Leahy Scale: Fair     Standing balance support: Bilateral upper extremity supported;During functional activity Standing balance-Leahy Scale: Poor Standing balance comment: relies on RW for support as well as external support                             Pertinent Vitals/Pain Pain Assessment: No/denies pain    Home Living Family/patient expects to be discharged to:: Assisted living Living Arrangements: Alone Available Help at Discharge: Family;Available PRN/intermittently Type of Home: Assisted living Home Access: Level entry     Home Layout: One level Home Equipment: Walker - 4 wheels;Bedside commode;Wheelchair - manual      Prior Function Level of Independence: Independent with assistive device(s)  Comments: Reports she walks to dining hall with RW and manages ADLs independently     Hand Dominance   Dominant Hand: Right    Extremity/Trunk Assessment   Upper Extremity Assessment Upper Extremity Assessment: Defer to OT evaluation    Lower Extremity Assessment Lower Extremity Assessment: Generalized weakness    Cervical /  Trunk Assessment Cervical / Trunk Assessment: Kyphotic  Communication   Communication: No difficulties  Cognition Arousal/Alertness: Awake/alert Behavior During Therapy: WFL for tasks assessed/performed Overall Cognitive Status: Within Functional Limits for tasks assessed                                        General Comments      Exercises     Assessment/Plan    PT Assessment Patient needs continued PT services  PT Problem List Decreased activity tolerance;Decreased balance;Decreased mobility;Decreased knowledge of use of DME;Decreased safety awareness;Decreased knowledge of precautions;Obesity;Decreased strength       PT Treatment Interventions DME instruction;Gait training;Functional mobility training;Therapeutic activities;Balance training;Therapeutic exercise;Patient/family education    PT Goals (Current goals can be found in the Care Plan section)  Acute Rehab PT Goals Patient Stated Goal: to go to A living after rehab PT Goal Formulation: With patient Time For Goal Achievement: 01/18/19 Potential to Achieve Goals: Good    Frequency Min 3X/week   Barriers to discharge        Co-evaluation               AM-PAC PT "6 Clicks" Mobility  Outcome Measure Help needed turning from your back to your side while in a flat bed without using bedrails?: A Lot Help needed moving from lying on your back to sitting on the side of a flat bed without using bedrails?: A Lot Help needed moving to and from a bed to a chair (including a wheelchair)?: A Lot Help needed standing up from a chair using your arms (e.g., wheelchair or bedside chair)?: A Lot Help needed to walk in hospital room?: A Lot Help needed climbing 3-5 steps with a railing? : Total 6 Click Score: 11    End of Session Equipment Utilized During Treatment: Gait belt Activity Tolerance: Patient limited by fatigue Patient left: in chair;with call bell/phone within reach;with chair alarm  set Nurse Communication: Mobility status PT Visit Diagnosis: Unsteadiness on feet (R26.81);Muscle weakness (generalized) (M62.81)    Time: [id number] PT Time Calculation (min) (ACUTE ONLY): 23 min   Charges:   PT Evaluation $PT Eval Moderate Complexity: 1 Mod PT Treatments $Gait Training: 8-22 mins        Cleston Lautner W,PT Acute Rehabilitation Services Pager:  [phone]  Office:  [place] 01/04/2019, 4:35 PM

## 2019-01-04 NOTE — Progress Notes (Signed)
ANTICOAGULATION CONSULT NOTE - Follow Up Consult  Pharmacy Consult for Coumadin Indication: Hx recurrent PE  Allergies  Allergen Reactions  . Crestor [Rosuvastatin Calcium] Other (See Comments)    Feeling very bad, aching all over.  . Erythromycin Hives  . Gabapentin Other (See Comments)    Blurred vision  . Pregabalin Other (See Comments)    Crossed vision.  . Pregabalin Other (See Comments)    Crossed vision. Unknown   . Macrobid [Nitrofurantoin] Hives  . Losartan Itching    Patient Measurements: Height: 5\' 6"  (167.6 cm) Weight: 175 lb (79.4 kg) IBW/kg (Calculated) : 59.3 Heparin Dosing Weight: 75 kg  Vital Signs: Temp: 98.5 F (36.9 C) (10/31 0543) Temp Source: Oral (10/31 0543) BP: 115/52 (10/31 0543) Pulse Rate: 80 (10/31 0543)  Labs: Recent Labs    01/03/19 1555 01/03/19 1747 01/04/19 0601  HGB 13.8  --  12.4  HCT 41.5  --  37.8  PLT 200  --  220  LABPROT  --  19.8*  --   INR  --  1.7*  --   HEPARINUNFRC  --   --  <0.10*  CREATININE 1.32*  --  1.08*    Estimated Creatinine Clearance: 47.8 mL/min (A) (by C-G formula based on SCr of 1.08 mg/dL (H)).   Medications:  Infusions:  . heparin 1,050 Units/hr (01/03/19 2142)  . meropenem (MERREM) IV 1 g (01/03/19 2302)    Assessment: 73 yo female on chronic Coumadin (4mg  PO daily) for hx recurrent PE admitted with slightly subtherapeutic INR (1.7). Was started on heparin due to subtherapeutic INR on 10/30. On 10/31, INR is therapeutic at 2.1. Per MD, discontinue heparin due to therapeutic INR.   Pharmacy asked to bridge with IV heparin and continue Coumadin.  Goal of Therapy:  INR 2-3 Monitor platelets by anticoagulation protocol: Yes   Plan:  1. Discontinue IV Heparin to 1200 units/hr 2. Give 6mg  po x 1 tonight. 3. Daily monitor for s/sx of bleeding, INR and CBC.  Acey Lav, PharmD  PGY1 Acute Care Pharmacy Resident [phone]  01/04/2019 8:14 AM

## 2019-01-04 NOTE — Plan of Care (Signed)
  Problem: Education: Goal: Knowledge of General Education information will improve Description Including pain rating scale, medication(s)/side effects and non-pharmacologic comfort measures Outcome: Progressing   Problem: Activity: Goal: Risk for activity intolerance will decrease Outcome: Progressing   Problem: Safety: Goal: Ability to remain free from injury will improve Outcome: Progressing   

## 2019-01-04 NOTE — Progress Notes (Signed)
Initial Nutrition Assessment  DOCUMENTATION CODES:   Not applicable  INTERVENTION:  -Ensure Enlive po BID, each supplement provides 350 kcal and 20 grams of protein -MVI daily   NUTRITION DIAGNOSIS:   Increased nutrient needs related to acute illness as evidenced by estimated needs(patient eating 0-50% of meals).   GOAL:   Patient will meet greater than or equal to 90% of their needs   MONITOR:   PO intake, Supplement acceptance, Labs, I & O's, Weight trends  REASON FOR ASSESSMENT:   Malnutrition Screening Tool    ASSESSMENT:  RD working remotely.  73 year old female with medical history significant for Parkinson's disease, recurrent UTIs, hx of recurrent PE on warfarin, hypothyroidism, HTN, fibromyalgia who presented to ED for evaluation of dysuria, bilateral flank pain, increased confusion, with nausea and decreased appetite over the past 2 weeks.  Patient admitted with complicated UTI; acute metabolic encephalopathy  Unable to obtain nutrition history at this time. Patient is a resident of [facility]. Per chart review patient is on HH/CM diet and eating 0-50% of meals.   Weight trending down over the past year; noted 20 lb (10.3%) weight loss since October 2019 which is insignificant for time frame, but concerning considering advanced age. Will provide Ensure to aid with calorie/protein needs and recommend daily MVI. 09/16/18 79.8 kg  08/29/18 80.3 kg  04/25/18 82.1 kg  02/21/18 77.1 kg  02/20/18 85.4 kg  02/14/18 86.6 kg  02/11/18 84.4 kg  12/20/17 89.5 kg  12/09/17 88.5 kg    Medications reviewed and include: KCl, Effexor, Merrem Labs: Na 134 K 3.3   NUTRITION - FOCUSED PHYSICAL EXAM: Unable to complete at this time, RD working remotely.  Diet Order:   Diet Order            Diet heart healthy/carb modified Room service appropriate? Yes; Fluid consistency: Thin  Diet effective now              EDUCATION NEEDS:   No education  needs have been identified at this time  Skin:  Skin Assessment: Reviewed RN Assessment  Last BM:  10/30  Height:   Ht Readings from Last 1 Encounters:  01/03/19 5\' 6"  (1.676 m)    Weight:   Wt Readings from Last 1 Encounters:  01/03/19 79.4 kg    Ideal Body Weight:  59.1 kg  BMI:  Body mass index is 28.25 kg/m.  Estimated Nutritional Needs:   Kcal:  1600-1800  Protein:  80-90  Fluid:  >/= 1.6 L/day  Lajuan Lines, RD, LDN Clinical Nutrition [place] Telephone [phone] After Hours/Weekend Pager: [phone]

## 2019-01-05 LAB — COMPREHENSIVE METABOLIC PANEL
ALT: 7 U/L (ref 0–44)
AST: 35 U/L (ref 15–41)
Albumin: 2.2 g/dL — ABNORMAL LOW (ref 3.5–5.0)
Alkaline Phosphatase: 106 U/L (ref 38–126)
Anion gap: 9 (ref 5–15)
BUN: 18 mg/dL (ref 8–23)
CO2: 25 mmol/L (ref 22–32)
Calcium: 8.9 mg/dL (ref 8.9–10.3)
Chloride: 104 mmol/L (ref 98–111)
Creatinine, Ser: 0.85 mg/dL (ref 0.44–1.00)
GFR calc Af Amer: >60 mL/min (ref >60)
GFR calc non Af Amer: >60 mL/min (ref >60)
Glucose, Bld: 125 mg/dL — ABNORMAL HIGH (ref 70–99)
Potassium: 4.2 mmol/L (ref 3.5–5.1)
Sodium: 138 mmol/L (ref 135–145)
Total Bilirubin: 0.4 mg/dL (ref 0.3–1.2)
Total Protein: 5.7 g/dL — ABNORMAL LOW (ref 6.5–8.1)

## 2019-01-05 LAB — CBC
HCT: 40.2 % (ref 36.0–46.0)
Hemoglobin: 13.2 g/dL (ref 12.0–15.0)
MCH: 29.1 pg (ref 26.0–34.0)
MCHC: 32.8 g/dL (ref 30.0–36.0)
MCV: 88.7 fL (ref 80.0–100.0)
Platelets: 218 10*3/uL (ref 150–400)
RBC: 4.53 MIL/uL (ref 3.87–5.11)
RDW: 12.7 % (ref 11.5–15.5)
WBC: 8.6 10*3/uL (ref 4.0–10.5)
nRBC: 0 % (ref 0.0–0.2)

## 2019-01-05 LAB — URINE CULTURE

## 2019-01-05 LAB — PROTIME-INR
INR: 2.8 — ABNORMAL HIGH (ref 0.8–1.2)
Prothrombin Time: 28.9 seconds — ABNORMAL HIGH (ref 11.4–15.2)

## 2019-01-05 LAB — MAGNESIUM: Magnesium: 1.7 mg/dL (ref 1.7–2.4)

## 2019-01-05 MED ORDER — SODIUM CHLORIDE 0.9 % IV SOLN
1.0000 g | Freq: Three times a day (TID) | INTRAVENOUS | Status: DC
  Administered 2019-01-05 – 2019-01-07 (×7): 1 g via INTRAVENOUS
  Filled 2019-01-05 (×8): qty 1

## 2019-01-05 MED ORDER — WARFARIN SODIUM 2 MG PO TABS
2.0000 mg | ORAL_TABLET | Freq: Once | ORAL | Status: AC
  Administered 2019-01-05: 2 mg via ORAL
  Filled 2019-01-05: qty 1

## 2019-01-05 NOTE — NC FL2 (Signed)
[place] LEVEL OF CARE SCREENING TOOL     IDENTIFICATION  Patient Name: [name] [name] Birthdate: 1945/11/04 Sex: female Admission Date (Current Location): 01/03/2019  [facility] and [place] Number:  Herbalist and Address:  [facility], [place] [address], [place], [place] [address]      Provider Number: [id number]  Attending Physician Name and Address:  Darliss Cheney, MD  Relative Name and Phone Number:  Ramyia, Ruple, [phone]    Current Level of Care: Hospital Recommended Level of Care: [place] Prior Approval Number:    Date Approved/Denied:   PASRR Number: [id number]  Discharge Plan: SNF    Current Diagnoses: Patient Active Problem List   Diagnosis Date Noted  . Acute metabolic encephalopathy [id number]  . Complicated UTI (urinary tract infection) 01/03/2019  . Subtherapeutic international normalized ratio (INR) 01/03/2019  . Hyponatremia 01/03/2019  . Pre-diabetes 08/28/2018  . Adenoma of right adrenal gland 02/20/2018  . Osteopenia 12/18/2017  . Glaucoma 10/13/2017  . Transient cerebral ischemic attack 10/13/2017  . History of prediabetes 10/13/2017  . AKI (acute kidney injury) ([place]) 02/12/2017  . Recurrent UTI 11/23/2016  . Fibromyalgia 11/23/2016  . Pulmonary HTN ([place]) 11/23/2016  . Left ventricular diastolic dysfunction 0000000  . History of pulmonary embolus (PE) 2/2 hypercoagulable state 11/23/2016  . Sepsis ([place]) 09/17/2016  . SIRS (systemic inflammatory response syndrome) ([place]) 04/29/2016  . Hyperlipidemia 04/29/2016  . Hypothyroidism 04/29/2016  . Hematuria 04/05/2016  . Benign essential HTN   . Displaced fracture of fifth cervical vertebra ([place]) 12/17/2015  . Gait disturbance 12/17/2015  . Trochanteric bursitis of both hips 09/13/2015  . Chronic low back pain 06/07/2015  . Parkinson's disease ([place]) 05/17/2015  . S/P right TKA 05/22/2011    Orientation RESPIRATION  BLADDER Height & Weight     Self, Time, Situation, Place  Normal Incontinent, External catheter Weight: 175 lb (79.4 kg) Height:  5\' 6"  (167.6 cm)  BEHAVIORAL SYMPTOMS/MOOD NEUROLOGICAL BOWEL NUTRITION STATUS      Continent Diet(heart healthy/carb modified diet, thin liquids)  AMBULATORY STATUS COMMUNICATION OF NEEDS Skin   Limited Assist Verbally Bruising, Skin abrasions(bruising on right/left leg and skin tear on right elbow)                       Personal Care Assistance Level of Assistance  Bathing, Feeding, Dressing, Total care Bathing Assistance: Limited assistance Feeding assistance: Independent Dressing Assistance: Limited assistance Total Care Assistance: Limited assistance   Functional Limitations Info  Sight, Hearing, Speech Sight Info: Impaired Hearing Info: Impaired Speech Info: Adequate    SPECIAL CARE FACTORS FREQUENCY  PT (By licensed PT), OT (By licensed OT)     PT Frequency: 5x/wk OT Frequency: 5x/wk            Contractures Contractures Info: Not present    Additional Factors Info  Code Status, Allergies Code Status Info: DNR Allergies Info: Crestor (Rosuvastatin Calcium), Erythromycin, Gabapentin, Pregabalin, Pregabalin, Macrobid (Nitrofurantoin), Losartan           Current Medications (01/05/2019):  This is the current hospital active medication list Current Facility-Administered Medications  Medication Dose Route Frequency Provider Last Rate Last Dose  . acetaminophen (TYLENOL) tablet 650 mg  650 mg Oral Q6H PRN Opyd, Ilene Qua, MD   650 mg at 01/04/19 1812   Or  . acetaminophen (TYLENOL) suppository 650 mg  650 mg Rectal Q6H PRN Opyd, Ilene Qua, MD      .  atorvastatin (LIPITOR) tablet 10 mg  10 mg Oral q1800 Opyd, Ilene Qua, MD   10 mg at 01/04/19 1757  . carbidopa-levodopa (SINEMET IR) 25-100 MG per tablet immediate release 1 tablet  1 tablet Oral 2 times per day Vianne Bulls, MD   1 tablet at 01/04/19 2154  . carbidopa-levodopa  (SINEMET IR) 25-100 MG per tablet immediate release 2 tablet  2 tablet Oral Q breakfast Opyd, Ilene Qua, MD   2 tablet at 01/05/19 0843  . feeding supplement (ENSURE ENLIVE) (ENSURE ENLIVE) liquid 237 mL  237 mL Oral BID BM Darliss Cheney, MD   237 mL at 01/05/19 0844  . levothyroxine (SYNTHROID) tablet 100 mcg  100 mcg Oral Daily Opyd, Ilene Qua, MD   100 mcg at 01/05/19 0631  . Melatonin TABS 4.5 mg  4.5 mg Oral QHS PRN Opyd, Ilene Qua, MD      . meropenem (MERREM) 1 g in sodium chloride 0.9 % 100 mL IVPB  1 g Intravenous Q12H Carney, [name] C, RPH 200 mL/hr at 01/05/19 0924 1 g at 01/05/19 0924  . multivitamin with minerals tablet 1 tablet  1 tablet Oral Daily Darliss Cheney, MD   1 tablet at 01/05/19 0843  . nicotine polacrilex (NICORETTE) gum 2 mg  2 mg Oral PRN Lovey Newcomer T, NP   2 mg at 01/05/19 0844  . ondansetron (ZOFRAN) tablet 4 mg  4 mg Oral Q6H PRN Opyd, Ilene Qua, MD       Or  . ondansetron (ZOFRAN) injection 4 mg  4 mg Intravenous Q6H PRN Opyd, Ilene Qua, MD      . polyethylene glycol (MIRALAX / GLYCOLAX) packet 17 g  17 g Oral Daily PRN Opyd, Ilene Qua, MD      . venlafaxine XR (EFFEXOR-XR) 24 hr capsule 150 mg  150 mg Oral Q breakfast Opyd, Ilene Qua, MD   150 mg at 01/05/19 0844  . Warfarin - Pharmacist Dosing Inpatient   Does not apply q1800 Carney, [name], [facility]         Discharge Medications: Please see discharge summary for a list of discharge medications.  Relevant Imaging Results:  Relevant Lab Results:   Additional Information SSN: [id number]; COVID Negtaive on 01/03/2019; going to need ertapenem 1 gm daily for 5 days starting 11/02  Savian Mazon B Shereece Wellborn, LCSWA

## 2019-01-05 NOTE — TOC Initial Note (Signed)
Transition of Care (TOC) - Initial/Assessment Note    Patient Details  Name: [name] [name] MRN: [id number] Date of Birth: 11/29/1945  Transition of Care (TOC) CM/SW Contact:     B , LCSWA Phone Number: 01/05/2019, 3:02 PM  Clinical Narrative:                  CSW met with the patient at bedside. CSW introduced herself and explained her role. CSW shared therapy recommendation. Patient is agreeable to going to SNF at [facility]. She did not have any additional concerns or questions.   CSW faxed the patient to [facility] and is awaiting confirmation of bed offer.   CSW will continue to follow and assist with disposition planning.   Expected Discharge Plan: Skilled Nursing Facility Barriers to Discharge: Continued Medical Work up   Patient Goals and CMS Choice Patient states their goals for this hospitalization and ongoing recovery are:: Pt wants to go to SNF at [facility] CMS Medicare.gov Compare Post Acute Care list provided to:: Patient Choice offered to / list presented to : Patient  Expected Discharge Plan and Services Expected Discharge Plan: Skilled Nursing Facility In-house Referral: Clinical Social Work Discharge Planning Services: NA Post Acute Care Choice: Skilled Nursing Facility Living arrangements for the past 2 months: Assisted Living Facility                 DME Arranged: N/A DME Agency: NA       HH Arranged: NA HH Agency: NA        Prior Living Arrangements/Services Living arrangements for the past 2 months: Assisted Living Facility Lives with:: Facility Resident Patient language and need for interpreter reviewed:: No Do you feel safe going back to the place where you live?: Yes      Need for Family Participation in Patient Care: No (Comment) Care giver support system in place?: Yes (comment)   Criminal Activity/Legal Involvement Pertinent to Current Situation/Hospitalization: No - Comment as needed  Activities of  Daily Living Home Assistive Devices/Equipment: Walker (specify type), Eyeglasses, Other (Comment)(Porta potty) ADL Screening (condition at time of admission) Patient's cognitive ability adequate to safely complete daily activities?: Yes Is the patient deaf or have difficulty hearing?: Yes Does the patient have difficulty seeing, even when wearing glasses/contacts?: No Does the patient have difficulty concentrating, remembering, or making decisions?: No Patient able to express need for assistance with ADLs?: Yes Does the patient have difficulty dressing or bathing?: Yes Independently performs ADLs?: Yes (appropriate for developmental age) Does the patient have difficulty walking or climbing stairs?: Yes Weakness of Legs: Both Weakness of Arms/Hands: None  Permission Sought/Granted Permission sought to share information with : Case Manager Permission granted to share information with : Yes, Verbal Permission Granted     Permission granted to share info w AGENCY: [facility]        Emotional Assessment Appearance:: Appears stated age Attitude/Demeanor/Rapport: Engaged Affect (typically observed): Calm Orientation: : Oriented to Self, Oriented to Place, Oriented to  Time, Oriented to Situation Alcohol / Substance Use: Not Applicable Psych Involvement: No (comment)  Admission diagnosis:  AKI (acute kidney injury) (HCC) [N17.9] Complicated UTI (urinary tract infection) [N39.0] Patient Active Problem List   Diagnosis Date Noted  . Acute metabolic encephalopathy 01/04/2019  . Complicated UTI (urinary tract infection) 01/03/2019  . Subtherapeutic international normalized ratio (INR) 01/03/2019  . Hyponatremia 01/03/2019  . Pre-diabetes 08/28/2018  . Adenoma of right adrenal gland 02/20/2018  . Osteopenia 12/18/2017  . Glaucoma 10/13/2017  .   Transient cerebral ischemic attack 10/13/2017  . History of prediabetes 10/13/2017  . AKI (acute kidney injury) ([place]) 02/12/2017  .  Recurrent UTI 11/23/2016  . Fibromyalgia 11/23/2016  . Pulmonary HTN ([place]) 11/23/2016  . Left ventricular diastolic dysfunction 32/95/1884  . History of pulmonary embolus (PE) 2/2 hypercoagulable state 11/23/2016  . Sepsis ([place]) 09/17/2016  . SIRS (systemic inflammatory response syndrome) ([place]) 04/29/2016  . Hyperlipidemia 04/29/2016  . Hypothyroidism 04/29/2016  . Hematuria 04/05/2016  . Benign essential HTN   . Displaced fracture of fifth cervical vertebra ([place]) 12/17/2015  . Gait disturbance 12/17/2015  . Trochanteric bursitis of both hips 09/13/2015  . Chronic low back pain 06/07/2015  . Parkinson's disease ([place]) 05/17/2015  . S/P right TKA 05/22/2011   PCP:  Darreld Mclean, MD Pharmacy:   [facility], [place] [address] Phone: [phone] Fax: [phone]     Social Determinants of Health (SDOH) Interventions    Readmission Risk Interventions No flowsheet data found.

## 2019-01-05 NOTE — Plan of Care (Signed)
  Problem: Education: Goal: Knowledge of General Education information will improve Description: Including pain rating scale, medication(s)/side effects and non-pharmacologic comfort measures Outcome: Progressing   Problem: Activity: Goal: Risk for activity intolerance will decrease Outcome: Progressing   Problem: Skin Integrity: Goal: Risk for impaired skin integrity will decrease Outcome: Progressing   

## 2019-01-05 NOTE — Progress Notes (Signed)
ANTICOAGULATION CONSULT NOTE - Follow Up Consult  Pharmacy Consult for Coumadin Indication: Hx recurrent PE  Allergies  Allergen Reactions  . Crestor [Rosuvastatin Calcium] Other (See Comments)    Feeling very bad, aching all over.  . Erythromycin Hives  . Gabapentin Other (See Comments)    Blurred vision  . Pregabalin Other (See Comments)    Crossed vision.  . Pregabalin Other (See Comments)    Crossed vision. Unknown   . Macrobid [Nitrofurantoin] Hives  . Losartan Itching    Patient Measurements: Height: 5\' 6"  (167.6 cm) Weight: 175 lb (79.4 kg) IBW/kg (Calculated) : 59.3 Heparin Dosing Weight: 75 kg  Vital Signs: Temp: 98.8 F (37.1 C) (11/01 0840) Temp Source: Oral (11/01 0840) BP: 124/60 (11/01 0840) Pulse Rate: 77 (11/01 0840)  Labs: Recent Labs    01/03/19 1555 01/03/19 1747 01/04/19 0601 01/05/19 1154  HGB 13.8  --  12.4 13.2  HCT 41.5  --  37.8 40.2  PLT 200  --  220 218  LABPROT  --  19.8* 22.9* 28.9*  INR  --  1.7* 2.1* 2.8*  HEPARINUNFRC  --   --  <0.10*  --   CREATININE 1.32*  --  1.08* 0.85    Estimated Creatinine Clearance: 60.8 mL/min (by C-G formula based on SCr of 0.85 mg/dL).   Medications:  Infusions:  . meropenem (MERREM) IV 1 g (01/05/19 [id number])    Assessment: 73 yo female on chronic Coumadin (4mg  PO daily) for hx recurrent PE admitted with slightly subtherapeutic INR (1.7). Today (11/1), INR is therapeutic at 2.8.  Pharmacy asked dose Coumadin.  Goal of Therapy:  INR 2-3 Monitor platelets by anticoagulation protocol: Yes   Plan:  Give Warfarin 2 mg po x 1 tonight.    - Daily monitor for s/sx of bleeding, INR and CBC.  Acey Lav, PharmD  PGY1 Acute Care Pharmacy Resident [phone]  01/05/2019 1:04 PM

## 2019-01-05 NOTE — Progress Notes (Signed)
PHARMACY NOTE:  ANTIMICROBIAL RENAL DOSAGE ADJUSTMENT  Current antimicrobial regimen includes a mismatch between antimicrobial dosage and estimated renal function.  As per policy approved by the Pharmacy & Therapeutics and Medical Executive Committees, the antimicrobial dosage will be adjusted accordingly.  Current antimicrobial dosage:  Meropenem 1 gm q12hr  Indication: ESBL UTI   Renal Function: Improved from Scr 1.30 to 0.85 (Estimated CrCl 60.8 mL/min)  Estimated Creatinine Clearance: 60.8 mL/min (by C-G formula based on SCr of 0.85 mg/dL).    Antimicrobial dosage has been changed to:  Meropenem 1 gm q8hr   Thank you for allowing pharmacy to be a part of this patient's care.  Acey Lav, PharmD  PGY1 Acute Care Pharmacy Resident [phone]  01/05/2019 1:14 PM

## 2019-01-05 NOTE — Progress Notes (Signed)
PROGRESS NOTE    [name] [name]  [id number] DOB: 1946-01-01 DOA: 01/03/2019 PCP: Darreld Mclean, MD   Brief Narrative:  [name] [name] is a 73 y.o. female with medical history significant for Parkinson's disease, recurrent UTIs on daily Keflex, history of recurrent PE on warfarin, hypothyroidism, and hypertension who presented to ED for evaluation of dysuria, bilateral flank pain, and increased confusion.  Flank pain and dysuria has been going on for 1 to 2 weeks associated with some nausea and decreased appetite and some loose stools.  She reports chronic cough but no fever or shortness of breath.  Upon arrival to the ED, patient is found to be afebrile, saturating well on room air, and with remaining vitals also normal.  Chemistry panel is notable for sodium 131, BUN 29, and creatinine 1.32, up from 0.75 in June.  CBC features a leukocytosis to 22,600.  Lactic acid is reassuringly normal.  INR was subtherapeutic at 1.7.  Blood and urine cultures were collected in the emergency department, 1 L of IV fluids was given, patient was treated with Zosyn, and Covid testing was negative.  She was admitted by hospital service for possible UTI causing acute encephalopathy.  Assessment & Plan:   Principal Problem:   Complicated UTI (urinary tract infection) Active Problems:   Parkinson's disease ([place])   Benign essential HTN   Hypothyroidism   History of pulmonary embolus (PE) 2/2 hypercoagulable state   AKI (acute kidney injury) ([place])   Subtherapeutic international normalized ratio (INR)   Hyponatremia   Acute metabolic encephalopathy   UTI: UA positive with leukoesterase and nitrites.  History of ESBL infection in the past.  Urine culture from this hospitalization is still negative thus far.  Continue to treat Merrem and wait for final cultures.  Acute metabolic encephalopathy: Likely due to UTI.  Patient is completely alert and oriented on my evaluation today.  Treat underlying  infection.  AKI: Resolved  Hypokalemia: Resolved  Hypovolemic hyponatremia: Resolved  Generalized weakness: PT OT recommends SNF.  Social worker on board.  Parkinson's disease: Continue Sinemet  Hypothyroidism: Continue Synthroid.  History of recurrent PE: On Coumadin.  INR therapeutic.  DVT prophylaxis: Coumadin Code Status: DNR Family Communication:  None present at bedside.  Plan of care discussed with patient in length and he verbalized understanding and agreed with it. Disposition Plan: Potential discharge tomorrow.  Estimated body mass index is 28.25 kg/m as calculated from the following:   Height as of this encounter: 5\' 6"  (1.676 m).   Weight as of this encounter: 79.4 kg.      Nutritional status:  Nutrition Problem: Increased nutrient needs Etiology: acute illness   Signs/Symptoms: estimated needs(patient eating 0-50% of meals)   Interventions: Ensure Enlive (each supplement provides 350kcal and 20 grams of protein), MVI    Consultants:   None  Procedures:   None  Antimicrobials:   Merrem   Subjective: Seen and examined.  Continues to complain of generalized body ache but improving.  No new complaint.  Objective: Vitals:   01/04/19 1817 01/04/19 1955 01/05/19 0412 01/05/19 0840  BP: 125/63 123/63 (!) 142/68 124/60  Pulse: 94 86 71 77  Resp: 18 18 18 18   Temp: 98.5 F (36.9 C) 98.2 F (36.8 C) 97.6 F (36.4 C) 98.8 F (37.1 C)  TempSrc: Oral Oral Oral Oral  SpO2: 94% 95% 97% 95%  Weight:      Height:        Intake/Output Summary (Last 24 hours) at 01/05/2019 1515 Last  data filed at 01/05/2019 1300 Gross per 24 hour  Intake 1610 ml  Output 2501 ml  Net -891 ml   Filed Weights   01/03/19 1830  Weight: 79.4 kg    Examination:  General exam: Appears calm and comfortable  Respiratory system: Clear to auscultation. Respiratory effort normal. Cardiovascular system: S1 & S2 heard, RRR. No JVD, murmurs, rubs, gallops or clicks.  No pedal edema. Gastrointestinal system: Abdomen is nondistended, soft and nontender. No organomegaly or masses felt. Normal bowel sounds heard. Central nervous system: Alert and oriented. No focal neurological deficits. Extremities: Symmetric 5 x 5 power. Skin: No rashes, lesions or ulcers.  Psychiatry: Judgement and insight appear normal. Mood & affect appropriate.    Data Reviewed: I have personally reviewed following labs and imaging studies  CBC: Recent Labs  Lab 01/03/19 1555 01/04/19 0601 01/05/19 1154  WBC 22.6* 15.2* 8.6  NEUTROABS  --  12.5*  --   HGB 13.8 12.4 13.2  HCT 41.5 37.8 40.2  MCV 88.9 88.5 88.7  PLT 200 220 [id number]   Basic Metabolic Panel: Recent Labs  Lab 01/03/19 1555 01/04/19 0601 01/05/19 1154  NA 131* 134* 138  K 4.5 3.3* 4.2  CL 94* 99 104  CO2 25 22 25   GLUCOSE 94 85 125*  BUN 29* 26* 18  CREATININE 1.32* 1.08* 0.85  CALCIUM 9.3 8.5* 8.9  MG  --   --  1.7   GFR: Estimated Creatinine Clearance: 60.8 mL/min (by C-G formula based on SCr of 0.85 mg/dL). Liver Function Tests: Recent Labs  Lab 01/03/19 1747 01/04/19 0601 01/05/19 1154  AST 38 41 35  ALT 5 5 7   ALKPHOS 131* 103 106  BILITOT 1.9* 1.1 0.4  PROT 7.0 5.6* 5.7*  ALBUMIN 3.0* 2.3* 2.2*   No results for input(s): LIPASE, AMYLASE in the last 168 hours. No results for input(s): AMMONIA in the last 168 hours. Coagulation Profile: Recent Labs  Lab 01/03/19 1747 01/04/19 0601 01/05/19 1154  INR 1.7* 2.1* 2.8*   Cardiac Enzymes: No results for input(s): CKTOTAL, CKMB, CKMBINDEX, TROPONINI in the last 168 hours. BNP (last 3 results) No results for input(s): PROBNP in the last 8760 hours. HbA1C: No results for input(s): HGBA1C in the last 72 hours. CBG: No results for input(s): GLUCAP in the last 168 hours. Lipid Profile: No results for input(s): CHOL, HDL, LDLCALC, TRIG, CHOLHDL, LDLDIRECT in the last 72 hours. Thyroid Function Tests: No results for input(s): TSH, T4TOTAL,  FREET4, T3FREE, THYROIDAB in the last 72 hours. Anemia Panel: No results for input(s): VITAMINB12, FOLATE, FERRITIN, TIBC, IRON, RETICCTPCT in the last 72 hours. Sepsis Labs: Recent Labs  Lab 01/03/19 1820 01/03/19 2032  LATICACIDVEN 1.5 1.3    Recent Results (from the past 240 hour(s))  Urine culture     Status: Abnormal   Collection Time: 01/03/19  5:52 PM   Specimen: Urine, Clean Catch  Result Value Ref Range Status   Specimen Description URINE, CLEAN CATCH  Final   Special Requests   Final    NONE Performed at [facility], [place] [address] [name], [place] [address]    Culture MULTIPLE SPECIES PRESENT, SUGGEST RECOLLECTION (A)  Final   Report Status 01/05/2019 FINAL  Final  Culture, blood (routine x 2)     Status: None (Preliminary result)   Collection Time: 01/03/19  6:31 PM   Specimen: BLOOD  Result Value Ref Range Status   Specimen Description BLOOD RIGHT ANTECUBITAL  Final   Special  Requests   Final    BOTTLES DRAWN AEROBIC AND ANAEROBIC Blood Culture results may not be optimal due to an inadequate volume of blood received in culture bottles   Culture   Final    NO GROWTH 2 DAYS Performed at [facility], [place] [address] [place], [place] [address]    Report Status PENDING  Incomplete  SARS CORONAVIRUS 2 (TAT 6-24 HRS) Nasopharyngeal Nasopharyngeal Swab     Status: None   Collection Time: 01/03/19  8:23 PM   Specimen: Nasopharyngeal Swab  Result Value Ref Range Status   SARS Coronavirus 2 NEGATIVE NEGATIVE Final    Comment: (NOTE) SARS-CoV-2 target nucleic acids are NOT DETECTED. The SARS-CoV-2 RNA is generally detectable in upper and lower respiratory specimens during the acute phase of infection. Negative results do not preclude SARS-CoV-2 infection, do not rule out co-infections with other pathogens, and should not be used as the sole basis for treatment or other patient management decisions. Negative results must be combined with clinical  observations, patient history, and epidemiological information. The expected result is Negative. Fact Sheet for Patients: SugarRoll.be Fact Sheet for Healthcare Providers: https://www.woods-mathews.com/ This test is not yet approved or cleared by the [place] FDA and  has been authorized for detection and/or diagnosis of SARS-CoV-2 by FDA under an Emergency Use Authorization (EUA). This EUA will remain  in effect (meaning this test can be used) for the duration of the COVID-19 declaration under Section 56 4(b)(1) of the Act, 21 U.S.C. section 360bbb-3(b)(1), unless the authorization is terminated or revoked sooner. Performed at [facility], [place] [address], [place], [place] [address]   Culture, blood (routine x 2)     Status: None (Preliminary result)   Collection Time: 01/03/19  9:08 PM   Specimen: Site Not Specified; Blood  Result Value Ref Range Status   Specimen Description SITE NOT SPECIFIED  Final   Special Requests   Final    BOTTLES DRAWN AEROBIC AND ANAEROBIC Blood Culture adequate volume   Culture   Final    NO GROWTH 2 DAYS Performed at [facility], [place] [address], [place], [place] [address]    Report Status PENDING  Incomplete      Radiology Studies: Dg Pelvis 1-2 Views  Result Date: 01/03/2019 CLINICAL DATA:  Pt states that she has fallen "a few times over the past couple of weeks." She states she fell again today and complains of bilateral hip pain "in the joints." Pt also states "my kids tell me I have a UTI," and she says she has Parkinson's. EXAM: PELVIS - 1-2 VIEW COMPARISON:  12/17/2015 FINDINGS: No fracture or bone lesion. Hip joints are normally spaced and aligned minor marginal osteophytes from the bases of the femoral heads. No other degenerative change. SI joints and symphysis pubis are normally spaced and aligned. Skeletal structures are demineralized. Surrounding soft tissues are unremarkable.  IMPRESSION: No fracture dislocation or acute finding Electronically Signed   By: Lajean Manes M.D.   On: 01/03/2019 18:57   Ct Head Wo Contrast  Result Date: 01/03/2019 CLINICAL DATA:  Altered LOC, multiple falls EXAM: CT HEAD WITHOUT CONTRAST TECHNIQUE: Contiguous axial images were obtained from the base of the skull through the vertex without intravenous contrast. COMPARISON:  CT 02/11/2018 FINDINGS: Brain: No acute territorial infarction, hemorrhage, or intracranial mass. Mild atrophy. Stable ventricular enlargement out of proportion to atrophy. Moderate hypodensity in the white matter consistent with chronic small vessel ischemic change. Vascular: No  hyperdense vessels.  Carotid vascular calcification Skull: Normal. Negative for fracture or focal lesion. Sinuses/Orbits: Mucosal thickening in the ethmoid sinuses Other: None IMPRESSION: 1. No CT evidence for acute intracranial abnormality. 2. Atrophy. Stable ventriculomegaly and moderate chronic small vessel ischemic changes of the white matter Electronically Signed   By: Donavan Foil M.D.   On: 01/03/2019 19:22    Scheduled Meds:  atorvastatin  10 mg Oral q1800   carbidopa-levodopa  1 tablet Oral 2 times per day   carbidopa-levodopa  2 tablet Oral Q breakfast   feeding supplement (ENSURE ENLIVE)  237 mL Oral BID BM   levothyroxine  100 mcg Oral Daily   multivitamin with minerals  1 tablet Oral Daily   venlafaxine XR  150 mg Oral Q breakfast   warfarin  2 mg Oral ONCE-1800   Warfarin - Pharmacist Dosing Inpatient   Does not apply q1800   Continuous Infusions:  meropenem (MERREM) IV       LOS: 1 day   Time spent: 26 minutes   Darliss Cheney, MD [facility]  01/05/2019, 3:15 PM   To contact the attending provider between 7A-7P or the covering provider during after hours 7P-7A, please log into the web site [email or web address] and use password [email or web address].

## 2019-01-06 LAB — COMPREHENSIVE METABOLIC PANEL
ALT: 6 U/L (ref 0–44)
AST: 25 U/L (ref 15–41)
Albumin: 2.1 g/dL — ABNORMAL LOW (ref 3.5–5.0)
Alkaline Phosphatase: 96 U/L (ref 38–126)
Anion gap: 10 (ref 5–15)
BUN: 18 mg/dL (ref 8–23)
CO2: 26 mmol/L (ref 22–32)
Calcium: 8.9 mg/dL (ref 8.9–10.3)
Chloride: 103 mmol/L (ref 98–111)
Creatinine, Ser: 0.85 mg/dL (ref 0.44–1.00)
GFR calc Af Amer: >60 mL/min (ref >60)
GFR calc non Af Amer: >60 mL/min (ref >60)
Glucose, Bld: 125 mg/dL — ABNORMAL HIGH (ref 70–99)
Potassium: 4.4 mmol/L (ref 3.5–5.1)
Sodium: 139 mmol/L (ref 135–145)
Total Bilirubin: 0.4 mg/dL (ref 0.3–1.2)
Total Protein: 5.5 g/dL — ABNORMAL LOW (ref 6.5–8.1)

## 2019-01-06 LAB — CBC
HCT: 41.8 % (ref 36.0–46.0)
Hemoglobin: 13.5 g/dL (ref 12.0–15.0)
MCH: 28.8 pg (ref 26.0–34.0)
MCHC: 32.3 g/dL (ref 30.0–36.0)
MCV: 89.3 fL (ref 80.0–100.0)
Platelets: 254 10*3/uL (ref 150–400)
RBC: 4.68 MIL/uL (ref 3.87–5.11)
RDW: 12.8 % (ref 11.5–15.5)
WBC: 9.9 10*3/uL (ref 4.0–10.5)
nRBC: 0 % (ref 0.0–0.2)

## 2019-01-06 LAB — PROTIME-INR
INR: 2.4 — ABNORMAL HIGH (ref 0.8–1.2)
Prothrombin Time: 26 seconds — ABNORMAL HIGH (ref 11.4–15.2)

## 2019-01-06 MED ORDER — WARFARIN SODIUM 4 MG PO TABS
4.0000 mg | ORAL_TABLET | Freq: Once | ORAL | Status: AC
  Administered 2019-01-06: 4 mg via ORAL
  Filled 2019-01-06: qty 1

## 2019-01-06 NOTE — Progress Notes (Signed)
ANTICOAGULATION CONSULT NOTE - Follow Up Consult  Pharmacy Consult for Coumadin Indication: Hx recurrent PE  Allergies  Allergen Reactions  . Crestor [Rosuvastatin Calcium] Other (See Comments)    Feeling very bad, aching all over.  . Erythromycin Hives  . Gabapentin Other (See Comments)    Blurred vision  . Pregabalin Other (See Comments)    Crossed vision.  . Pregabalin Other (See Comments)    Crossed vision. Unknown   . Macrobid [Nitrofurantoin] Hives  . Losartan Itching    Patient Measurements: Height: 5\' 6"  (167.6 cm) Weight: 175 lb (79.4 kg) IBW/kg (Calculated) : 59.3 Heparin Dosing Weight: 75 kg  Vital Signs: Temp: 98 F (36.7 C) (11/02 0849) Temp Source: Oral (11/02 0849) BP: 146/67 (11/02 0849) Pulse Rate: 71 (11/02 0849)  Labs: Recent Labs    01/04/19 0601 01/05/19 1154 01/06/19 0612 01/06/19 0806  HGB 12.4 13.2 13.5  --   HCT 37.8 40.2 41.8  --   PLT 220 218 254  --   LABPROT 22.9* 28.9*  --  26.0*  INR 2.1* 2.8*  --  2.4*  HEPARINUNFRC <0.10*  --   --   --   CREATININE 1.08* 0.85 0.85  --     Estimated Creatinine Clearance: 60.8 mL/min (by C-G formula based on SCr of 0.85 mg/dL).   Medications:  Infusions:  . meropenem (MERREM) IV 1 g (01/06/19 0243)    Assessment: 73 yo female on chronic Coumadin (4mg  PO daily) for hx recurrent PE admitted with slightly subtherapeutic INR (1.7) on admission. Today (11/2), INR is therapeutic at 2.4.  Pharmacy asked dose Coumadin.  Goal of Therapy:  INR 2-3 Monitor platelets by anticoagulation protocol: Yes   Plan:  Give Warfarin 4 mg po x 1 tonight.    - Daily monitor for s/sx of bleeding, INR and CBC.  Eryk Beavers A. Levada Dy, PharmD, BCPS, FNKF Clinical Pharmacist [place] Please utilize Amion for appropriate phone number to reach the unit pharmacist ([place])    01/06/2019 9:09 AM

## 2019-01-06 NOTE — Progress Notes (Signed)
Pharmacy Antibiotic Note  [name] [name] is a 73 y.o. female admitted on 01/03/2019 with UTI, hx ESBL.  Pharmacy has been consulted for meropenem dosing.  Plan: 1.Continue Meropenem 1g q 8 hrs. 2. F/u renal function, cultures and clinical course.  Height: 5\' 6"  (167.6 cm) Weight: 175 lb (79.4 kg) IBW/kg (Calculated) : 59.3  Temp (24hrs), Avg:98.2 F (36.8 C), Min:97.8 F (36.6 C), Max:98.7 F (37.1 C)  Recent Labs  Lab 01/03/19 1555 01/03/19 1820 01/03/19 2032 01/04/19 0601 01/05/19 1154 01/06/19 0612  WBC 22.6*  --   --  15.2* 8.6 9.9  CREATININE 1.32*  --   --  1.08* 0.85 0.85  LATICACIDVEN  --  1.5 1.3  --   --   --     Estimated Creatinine Clearance: 60.8 mL/min (by C-G formula based on SCr of 0.85 mg/dL).    Allergies  Allergen Reactions  . Crestor [Rosuvastatin Calcium] Other (See Comments)    Feeling very bad, aching all over.  . Erythromycin Hives  . Gabapentin Other (See Comments)    Blurred vision  . Pregabalin Other (See Comments)    Crossed vision.  . Pregabalin Other (See Comments)    Crossed vision. Unknown   . Macrobid [Nitrofurantoin] Hives  . Losartan Itching   Antimicrobials this admission:  Meropenem 10/30 >   Dose adjustments this admission:   Microbiology results:  10/30 BCx x 2 > NGTD 10/30 UCx > multiple species 10/30 COVID > negative  Benson Porcaro A. Levada Dy, PharmD, BCPS, FNKF Clinical Pharmacist [place] Please utilize Amion for appropriate phone number to reach the unit pharmacist ([place])    01/06/2019 9:14 AM

## 2019-01-06 NOTE — Progress Notes (Signed)
PROGRESS NOTE    [name] [name]  [id number] DOB: [birth year] DOA: 01/03/2019 PCP: Darreld Mclean, MD   Brief Narrative:  [name] [name] is a 73 y.o. female with medical history significant for Parkinson's disease, recurrent UTIs on daily Keflex, history of recurrent PE on warfarin, hypothyroidism, and hypertension who presented to ED for evaluation of dysuria, bilateral flank pain, and increased confusion.  Flank pain and dysuria has been going on for 1 to 2 weeks associated with some nausea and decreased appetite and some loose stools.  She reports chronic cough but no fever or shortness of breath.  Upon arrival to the ED, patient is found to be afebrile, saturating well on room air, and with remaining vitals also normal.  Chemistry panel is notable for sodium 131, BUN 29, and creatinine 1.32, up from 0.75 in June.  CBC features a leukocytosis to 22,600.  Lactic acid is reassuringly normal.  INR was subtherapeutic at 1.7.  Blood and urine cultures were collected in the emergency department, 1 L of IV fluids was given, patient was treated with Zosyn, and Covid testing was negative.  She was admitted by hospital service for possible UTI causing acute encephalopathy.  Assessment & Plan:   Principal Problem:   Complicated UTI (urinary tract infection) Active Problems:   Parkinson's disease ([place])   Benign essential HTN   Hypothyroidism   History of pulmonary embolus (PE) 2/2 hypercoagulable state   AKI (acute kidney injury) ([place])   Subtherapeutic international normalized ratio (INR)   Hyponatremia   Acute metabolic encephalopathy   UTI: UA positive with leukoesterase and nitrites.  History of ESBL infection in the past.  Urine culture from this hospitalization showing multiple organism, likely contamination.  Repeat urine culture will not yield anything since patient has been on antibiotics for 3 days.  Continue to treat with Merrem empirically for possible ESBL E. coli UTI.  Acute  metabolic encephalopathy: Likely due to UTI.  Patient is completely alert and oriented on my evaluation today.  Treat underlying infection.  AKI: Resolved  Hypokalemia: Resolved  Hypovolemic hyponatremia: Resolved  Generalized weakness: PT OT recommends SNF.  Social worker on board.  Her rehab facility requires repeat COVID-19 test.  Will order one today.  Parkinson's disease: Continue Sinemet  Hypothyroidism: Continue Synthroid.  History of recurrent PE: On Coumadin.  INR therapeutic.  DVT prophylaxis: Coumadin Code Status: DNR Family Communication:  None present at bedside.  Plan of care discussed with patient in length and he verbalized understanding and agreed with it. Disposition Plan: Potential discharge tomorrow, pending repeat negative COVID-19 test  Estimated body mass index is 28.25 kg/m as calculated from the following:   Height as of this encounter: 5\' 6"  (1.676 m).   Weight as of this encounter: 79.4 kg.      Nutritional status:  Nutrition Problem: Increased nutrient needs Etiology: acute illness   Signs/Symptoms: estimated needs(patient eating 0-50% of meals)   Interventions: Ensure Enlive (each supplement provides 350kcal and 20 grams of protein), MVI    Consultants:   None  Procedures:   None  Antimicrobials:   Merrem   Subjective: Seen and examined.  Continues to complain of body ache but she states that this is her baseline body ache due to fibromyalgia.  No other complaint.  Objective: Vitals:   01/05/19 1710 01/05/19 2241 01/06/19 0459 01/06/19 0849  BP: 132/65 (!) 149/76 133/69 (!) 146/67  Pulse: 79 87 66 71  Resp: 18 18 16 18   Temp: 98.1 F (36.7  C) 98.7 F (37.1 C) 97.8 F (36.6 C) 98 F (36.7 C)  TempSrc: Oral Oral Oral Oral  SpO2: 94% 95% 93% 96%  Weight:      Height:        Intake/Output Summary (Last 24 hours) at 01/06/2019 1153 Last data filed at 01/06/2019 0849 Gross per 24 hour  Intake 1121.11 ml  Output 2800  ml  Net -1678.89 ml   Filed Weights   01/03/19 1830  Weight: 79.4 kg    Examination:  General exam: Appears calm and comfortable  Respiratory system: Clear to auscultation. Respiratory effort normal. Cardiovascular system: S1 & S2 heard, RRR. No JVD, murmurs, rubs, gallops or clicks. No pedal edema. Gastrointestinal system: Abdomen is nondistended, soft and nontender. No organomegaly or masses felt. Normal bowel sounds heard. Central nervous system: Alert and oriented. No focal neurological deficits. Extremities: Symmetric 5 x 5 power. Skin: No rashes, lesions or ulcers.  Psychiatry: Judgement and insight appear normal. Mood & affect appropriate.   Data Reviewed: I have personally reviewed following labs and imaging studies  CBC: Recent Labs  Lab 01/03/19 1555 01/04/19 0601 01/05/19 1154 01/06/19 0612  WBC 22.6* 15.2* 8.6 9.9  NEUTROABS  --  12.5*  --   --   HGB 13.8 12.4 13.2 13.5  HCT 41.5 37.8 40.2 41.8  MCV 88.9 88.5 88.7 89.3  PLT 200 220 218 0000000   Basic Metabolic Panel: Recent Labs  Lab 01/03/19 1555 01/04/19 0601 01/05/19 1154 01/06/19 0612  NA 131* 134* 138 139  K 4.5 3.3* 4.2 4.4  CL 94* 99 104 103  CO2 25 22 25 26   GLUCOSE 94 85 125* 125*  BUN 29* 26* 18 18  CREATININE 1.32* 1.08* 0.85 0.85  CALCIUM 9.3 8.5* 8.9 8.9  MG  --   --  1.7  --    GFR: Estimated Creatinine Clearance: 60.8 mL/min (by C-G formula based on SCr of 0.85 mg/dL). Liver Function Tests: Recent Labs  Lab 01/03/19 1747 01/04/19 0601 01/05/19 1154 01/06/19 0612  AST 38 41 35 25  ALT 5 5 7 6   ALKPHOS 131* 103 106 96  BILITOT 1.9* 1.1 0.4 0.4  PROT 7.0 5.6* 5.7* 5.5*  ALBUMIN 3.0* 2.3* 2.2* 2.1*   No results for input(s): LIPASE, AMYLASE in the last 168 hours. No results for input(s): AMMONIA in the last 168 hours. Coagulation Profile: Recent Labs  Lab 01/03/19 1747 01/04/19 0601 01/05/19 1154 01/06/19 0806  INR 1.7* 2.1* 2.8* 2.4*   Cardiac Enzymes: No results for  input(s): CKTOTAL, CKMB, CKMBINDEX, TROPONINI in the last 168 hours. BNP (last 3 results) No results for input(s): PROBNP in the last 8760 hours. HbA1C: No results for input(s): HGBA1C in the last 72 hours. CBG: No results for input(s): GLUCAP in the last 168 hours. Lipid Profile: No results for input(s): CHOL, HDL, LDLCALC, TRIG, CHOLHDL, LDLDIRECT in the last 72 hours. Thyroid Function Tests: No results for input(s): TSH, T4TOTAL, FREET4, T3FREE, THYROIDAB in the last 72 hours. Anemia Panel: No results for input(s): VITAMINB12, FOLATE, FERRITIN, TIBC, IRON, RETICCTPCT in the last 72 hours. Sepsis Labs: Recent Labs  Lab 01/03/19 1820 01/03/19 2032  LATICACIDVEN 1.5 1.3    Recent Results (from the past 240 hour(s))  Urine culture     Status: Abnormal   Collection Time: 01/03/19  5:52 PM   Specimen: Urine, Clean Catch  Result Value Ref Range Status   Specimen Description URINE, CLEAN CATCH  Final   Special Requests  Final    NONE Performed at [facility], [place] [address] [place], [place] [address]    Culture MULTIPLE SPECIES PRESENT, SUGGEST RECOLLECTION (A)  Final   Report Status 01/05/2019 FINAL  Final  Culture, blood (routine x 2)     Status: None (Preliminary result)   Collection Time: 01/03/19  6:31 PM   Specimen: BLOOD  Result Value Ref Range Status   Specimen Description BLOOD RIGHT ANTECUBITAL  Final   Special Requests   Final    BOTTLES DRAWN AEROBIC AND ANAEROBIC Blood Culture results may not be optimal due to an inadequate volume of blood received in culture bottles   Culture   Final    NO GROWTH 3 DAYS Performed at [facility], [place] [address] [place], [place] [address]    Report Status PENDING  Incomplete  SARS CORONAVIRUS 2 (TAT 6-24 HRS) Nasopharyngeal Nasopharyngeal Swab     Status: None   Collection Time: 01/03/19  8:23 PM   Specimen: Nasopharyngeal Swab  Result Value Ref Range Status   SARS Coronavirus 2 NEGATIVE NEGATIVE Final     Comment: (NOTE) SARS-CoV-2 target nucleic acids are NOT DETECTED. The SARS-CoV-2 RNA is generally detectable in upper and lower respiratory specimens during the acute phase of infection. Negative results do not preclude SARS-CoV-2 infection, do not rule out co-infections with other pathogens, and should not be used as the sole basis for treatment or other patient management decisions. Negative results must be combined with clinical observations, patient history, and epidemiological information. The expected result is Negative. Fact Sheet for Patients: SugarRoll.be Fact Sheet for Healthcare Providers: https://www.woods-mathews.com/ This test is not yet approved or cleared by the [place] FDA and  has been authorized for detection and/or diagnosis of SARS-CoV-2 by FDA under an Emergency Use Authorization (EUA). This EUA will remain  in effect (meaning this test can be used) for the duration of the COVID-19 declaration under Section 56 4(b)(1) of the Act, 21 U.S.C. section 360bbb-3(b)(1), unless the authorization is terminated or revoked sooner. Performed at [facility], [place] [address], [place], [place] [address]   Culture, blood (routine x 2)     Status: None (Preliminary result)   Collection Time: 01/03/19  9:08 PM   Specimen: Site Not Specified; Blood  Result Value Ref Range Status   Specimen Description SITE NOT SPECIFIED  Final   Special Requests   Final    BOTTLES DRAWN AEROBIC AND ANAEROBIC Blood Culture adequate volume   Culture   Final    NO GROWTH 3 DAYS Performed at [facility], [place] [address], [place], [place] [address]    Report Status PENDING  Incomplete      Radiology Studies: No results found.  Scheduled Meds: . atorvastatin  10 mg Oral q1800  . carbidopa-levodopa  1 tablet Oral 2 times per day  . carbidopa-levodopa  2 tablet Oral Q breakfast  . feeding supplement (ENSURE ENLIVE)  237 mL Oral BID BM   . levothyroxine  100 mcg Oral Daily  . multivitamin with minerals  1 tablet Oral Daily  . venlafaxine XR  150 mg Oral Q breakfast  . warfarin  4 mg Oral ONCE-1800  . Warfarin - Pharmacist Dosing Inpatient   Does not apply q1800   Continuous Infusions: . meropenem (MERREM) IV 1 g (01/06/19 0937)     LOS: 2 days   Time spent: 25 minutes   Darliss Cheney, MD [facility]  01/06/2019, 11:53 AM  To contact the attending provider between 7A-7P or the covering provider during after hours 7P-7A, please log into the web site [email or web address] and use password [email or web address].

## 2019-01-06 NOTE — Evaluation (Signed)
Occupational Therapy Evaluation Patient Details Name: [name] [name] MRN: [id number] DOB: March 15, 1945 Today's Date: 01/06/2019    History of Present Illness [name] [name] is a 73 y.o. female with medical history significant for Parkinson's disease, recurrent UTIs on daily Keflex, history of recurrent PE on warfarin, hypothyroidism, and hypertension who presented to ED for evaluation of dysuria, bilateral flank pain, and increased confusion.  Flank pain and dysuria has been going on for 1 to 2 weeks associated with some nausea and decreased appetite and some loose stools.  She reports chronic cough but no fever or shortness of breath.  Admit for UTI and encephalopathy.    Clinical Impression   Patient is a 73 year old female that resides in an Assisted Living facility, however reports she does not need assistance for basic ADLs. Pt utilizes a rolling walker for ambulation and functional transfers. This session pt require min cues and moderate assistance for bed mobility, min A for functional transfer to bedside chair with min cues for safety with body mechanics. Pt tends to let go with right arm to reach for chair arms vs holding onto walker until fully aligned with chair.     Follow Up Recommendations  SNF    Equipment Recommendations  Other (comment)(defer to future venue)       Precautions / Restrictions Precautions Precautions: Fall Restrictions Weight Bearing Restrictions: No      Mobility Bed Mobility Overal bed mobility: Needs Assistance Bed Mobility: Supine to Sit     Supine to sit: Mod assist     General bed mobility comments: assist for LEs and elevation of trunk taking incr time to come to EOB and use of pad  Transfers Overall transfer level: Needs assistance Equipment used: Rolling walker (2 wheeled) Transfers: Sit to/from Stand Sit to Stand: Min assist;From elevated surface         General transfer comment: Needed assist to power up and steady once up. Cues for  hand placement    Balance Overall balance assessment: Needs assistance Sitting-balance support: No upper extremity supported;Feet supported Sitting balance-Leahy Scale: Fair     Standing balance support: Bilateral upper extremity supported;During functional activity Standing balance-Leahy Scale: Poor Standing balance comment: relies on RW for support as well as external support                           ADL either performed or assessed with clinical judgement   ADL Overall ADL's : Needs assistance/impaired Eating/Feeding: Independent;Bed level   Grooming: Set up;Sitting   Upper Body Bathing: Min guard;Sitting   Lower Body Bathing: Moderate assistance;Sit to/from stand   Upper Body Dressing : Min guard;Sitting   Lower Body Dressing: Moderate assistance;Sit to/from stand   Toilet Transfer: Minimal assistance;Ambulation;Comfort height toilet;RW   Toileting- Clothing Manipulation and Hygiene: Minimal assistance;Sit to/from stand               Vision Baseline Vision/History: Wears glasses Wears Glasses: At all times              Pertinent Vitals/Pain Pain Assessment: 0-10 Pain Score: 9  Pain Location: bilateral knees Pain Intervention(s): Limited activity within patient's tolerance;Monitored during session;Other (comment)("they won't give me pain meds" per patient)     Hand Dominance Right   Extremity/Trunk Assessment Upper Extremity Assessment Upper Extremity Assessment: Generalized weakness   Lower Extremity Assessment Lower Extremity Assessment: Defer to PT evaluation   Cervical / Trunk Assessment Cervical / Trunk Assessment: Kyphotic  Communication Communication Communication: No difficulties   Cognition Arousal/Alertness: Awake/alert Behavior During Therapy: WFL for tasks assessed/performed Overall Cognitive Status: Within Functional Limits for tasks assessed                                                Home  Living Family/patient expects to be discharged to:: Assisted living Living Arrangements: Alone Available Help at Discharge: Family;Available PRN/intermittently Type of Home: Assisted living Home Access: Level entry     Home Layout: One level     Bathroom Shower/Tub: Occupational psychologist: Standard     Home Equipment: Environmental consultant - 4 wheels;Bedside commode;Wheelchair - [employer] - 2 wheels          Prior Functioning/Environment Level of Independence: Independent with assistive device(s)        Comments: (reports manages all ADLs independently)        OT Problem List: Decreased strength;Decreased activity tolerance;Impaired balance (sitting and/or standing);Decreased safety awareness;Pain      OT Treatment/Interventions: Self-care/ADL training;Therapeutic exercise;Energy conservation;DME and/or AE instruction;Therapeutic activities;Patient/family education;Balance training    OT Goals(Current goals can be found in the care plan section) Acute Rehab OT Goals Patient Stated Goal: to get stronger OT Goal Formulation: With patient Time For Goal Achievement: 01/20/19 Potential to Achieve Goals: Good  OT Frequency: Min 2X/week    AM-PAC OT "6 Clicks" Daily Activity     Outcome Measure Help from another person eating meals?: None Help from another person taking care of personal grooming?: A Little Help from another person toileting, which includes using toliet, bedpan, or urinal?: A Little Help from another person bathing (including washing, rinsing, drying)?: A Lot Help from another person to put on and taking off regular upper body clothing?: A Little Help from another person to put on and taking off regular lower body clothing?: A Lot 6 Click Score: 17   End of Session Equipment Utilized During Treatment: Gait belt;Rolling walker Nurse Communication: Mobility status  Activity Tolerance: Patient tolerated treatment well Patient left: in chair;with call  bell/phone within reach;with chair alarm set  OT Visit Diagnosis: Unsteadiness on feet (R26.81);Other abnormalities of gait and mobility (R26.89);Muscle weakness (generalized) (M62.81);Pain Pain - Right/Left: (bilateral) Pain - part of body: Knee                Time: 1206-1232 OT Time Calculation (min): 26 min Charges:  OT General Charges $OT Visit: 1 Visit OT Evaluation $OT Eval Moderate Complexity: 1 Mod OT Treatments $Self Care/Home Management : 8-22 mins  Shon Millet OT OT office: [place] 01/06/2019, 1:33 PM

## 2019-01-06 NOTE — Plan of Care (Signed)

## 2019-01-07 DIAGNOSIS — E039 Hypothyroidism, unspecified: Secondary | ICD-10-CM | POA: Diagnosis not present

## 2019-01-07 DIAGNOSIS — G243 Spasmodic torticollis: Secondary | ICD-10-CM | POA: Diagnosis not present

## 2019-01-07 DIAGNOSIS — M0689 Other specified rheumatoid arthritis, multiple sites: Secondary | ICD-10-CM | POA: Diagnosis present

## 2019-01-07 DIAGNOSIS — E7849 Other hyperlipidemia: Secondary | ICD-10-CM | POA: Diagnosis not present

## 2019-01-07 DIAGNOSIS — E038 Other specified hypothyroidism: Secondary | ICD-10-CM | POA: Diagnosis not present

## 2019-01-07 DIAGNOSIS — R5381 Other malaise: Secondary | ICD-10-CM | POA: Diagnosis not present

## 2019-01-07 DIAGNOSIS — I10 Essential (primary) hypertension: Secondary | ICD-10-CM | POA: Diagnosis not present

## 2019-01-07 DIAGNOSIS — G20 Parkinson's disease: Secondary | ICD-10-CM | POA: Diagnosis not present

## 2019-01-07 DIAGNOSIS — N390 Urinary tract infection, site not specified: Secondary | ICD-10-CM | POA: Diagnosis not present

## 2019-01-07 DIAGNOSIS — Z743 Need for continuous supervision: Secondary | ICD-10-CM | POA: Diagnosis not present

## 2019-01-07 DIAGNOSIS — Z96651 Presence of right artificial knee joint: Secondary | ICD-10-CM | POA: Diagnosis present

## 2019-01-07 DIAGNOSIS — F320 Major depressive disorder, single episode, mild: Secondary | ICD-10-CM | POA: Diagnosis not present

## 2019-01-07 DIAGNOSIS — M79674 Pain in right toe(s): Secondary | ICD-10-CM | POA: Diagnosis not present

## 2019-01-07 DIAGNOSIS — Z1612 Extended spectrum beta lactamase (ESBL) resistance: Secondary | ICD-10-CM | POA: Diagnosis present

## 2019-01-07 DIAGNOSIS — B9689 Other specified bacterial agents as the cause of diseases classified elsewhere: Secondary | ICD-10-CM | POA: Diagnosis not present

## 2019-01-07 DIAGNOSIS — J45998 Other asthma: Secondary | ICD-10-CM | POA: Diagnosis not present

## 2019-01-07 DIAGNOSIS — R279 Unspecified lack of coordination: Secondary | ICD-10-CM | POA: Diagnosis not present

## 2019-01-07 DIAGNOSIS — M797 Fibromyalgia: Secondary | ICD-10-CM | POA: Diagnosis present

## 2019-01-07 DIAGNOSIS — I5032 Chronic diastolic (congestive) heart failure: Secondary | ICD-10-CM | POA: Diagnosis present

## 2019-01-07 DIAGNOSIS — G9340 Encephalopathy, unspecified: Secondary | ICD-10-CM | POA: Diagnosis present

## 2019-01-07 DIAGNOSIS — M4692 Unspecified inflammatory spondylopathy, cervical region: Secondary | ICD-10-CM | POA: Diagnosis present

## 2019-01-07 DIAGNOSIS — B351 Tinea unguium: Secondary | ICD-10-CM | POA: Diagnosis not present

## 2019-01-07 DIAGNOSIS — I2699 Other pulmonary embolism without acute cor pulmonale: Secondary | ICD-10-CM | POA: Diagnosis not present

## 2019-01-07 LAB — CBC
HCT: 40.1 % (ref 36.0–46.0)
Hemoglobin: 13.2 g/dL (ref 12.0–15.0)
MCH: 28.8 pg (ref 26.0–34.0)
MCHC: 32.9 g/dL (ref 30.0–36.0)
MCV: 87.4 fL (ref 80.0–100.0)
Platelets: 288 10*3/uL (ref 150–400)
RBC: 4.59 MIL/uL (ref 3.87–5.11)
RDW: 12.8 % (ref 11.5–15.5)
WBC: 10.1 10*3/uL (ref 4.0–10.5)
nRBC: 0 % (ref 0.0–0.2)

## 2019-01-07 LAB — PROTIME-INR
INR: 2.3 — ABNORMAL HIGH (ref 0.8–1.2)
Prothrombin Time: 24.7 seconds — ABNORMAL HIGH (ref 11.4–15.2)

## 2019-01-07 LAB — NOVEL CORONAVIRUS, NAA (HOSP ORDER, SEND-OUT TO REF LAB; TAT 18-24 HRS): SARS-CoV-2, NAA: NOT DETECTED

## 2019-01-07 MED ORDER — WARFARIN SODIUM 4 MG PO TABS
4.0000 mg | ORAL_TABLET | Freq: Once | ORAL | Status: AC
  Administered 2019-01-07: 4 mg via ORAL
  Filled 2019-01-07: qty 1

## 2019-01-07 MED ORDER — ERTAPENEM IV (FOR PTA / DISCHARGE USE ONLY): 1.0000 g | INTRAVENOUS | 0 refills | Status: DC

## 2019-01-07 NOTE — Progress Notes (Signed)
Patient was taken off contact precaution for ESBL per Infection Disease request Tamsen Meek.

## 2019-01-07 NOTE — TOC Transition Note (Signed)
Transition of Care [facility]) - CM/SW Discharge Note *Discharged to [facility] via ambulance   Patient Details  Name: [name] [name] MRN: [id number] Date of Birth: 1945-12-26  [facility] CM/SW Contact:  Sable Feil, LCSW Phone Number: 01/07/2019, 3:26 PM   Clinical Narrative:  Patient medically stable for discharge today and is going to [facility]. Patient aware of her readiness for discharge and when asked, informed CSW that she had already contacted her daughter today regarding her discharge. Patient advised that non-emergency transport will take her to the facility. Discharge clinicals transmitted to facility.     Final next level of care: Skilled Nursing Facility([facility]) Barriers to Discharge: No Barriers Identified   Patient Goals and CMS Choice Patient states their goals for this hospitalization and ongoing recovery are:: Patient wants to get stronger and return to her home CMS Medicare.gov Compare Post Acute Care list provided to:: Other (Comment Required)(Patient from [facility] and requested their SNF) Choice offered to / list presented to : Patient  Discharge Placement   Existing PASRR number confirmed : 01/05/19          Patient chooses bed at: [facility] Patient to be transferred to facility by: Ambulance Name of family member notified: Patient contacted her daughter [place] Citizen Patient and family notified of of transfer: 01/07/19  Discharge Plan and Services In-house Referral: Clinical Social Work Discharge Planning Services: NA Post Acute Care Choice: [place]          DME Arranged: N/A DME Agency: NA       HH Arranged: NA HH Agency: NA        Social Determinants of Health (SDOH) Interventions  No SDOH interventions needed prior to discharge.    Readmission Risk Interventions No flowsheet data found.

## 2019-01-07 NOTE — Progress Notes (Signed)
[name] [name] to be discharged to [facility] per MD order. Patient verbalized understanding.  Skin clean, dry and intact without evidence of skin break down, no evidence of skin tears noted. IV catheter intact. Site without signs and symptoms of complications. Dressing and pressure applied. Pt denies pain at the site currently. No complaints noted.  Patient free of lines, drains, and wounds.   Discharge packet assembled. An After Visit Summary (AVS) was printed and given to the EMS personnel. Patient escorted via stretcher and discharged to [facility] via ambulance. Report called to accepting facility; all questions and concerns addressed.   Suzy Bouchard, RN

## 2019-01-07 NOTE — Progress Notes (Signed)
PHARMACY CONSULT NOTE FOR:  OUTPATIENT  PARENTERAL ANTIBIOTIC THERAPY (OPAT)  Indication: ESBL UTI Regimen: Ertapenem 1 gram iv Q 24  End date: 01/10/19  IV antibiotic discharge orders are pended. To discharging provider:  please sign these orders via discharge navigator,  Select New Orders & click on the button choice - Manage This Unsigned Work.     Thank you for allowing pharmacy to be a part of this patient's care.  Tad Moore 01/07/2019, 8:57 AM

## 2019-01-07 NOTE — Discharge Summary (Signed)
Physician Discharge Summary  [name] [name] ZDG:[id number] DOB: Aug 13, 1945 DOA: 01/03/2019  PCP: Darreld Mclean, MD  Admit date: 01/03/2019 Discharge date: 01/07/2019  Admitted From: [place] home  Disposition:  [facility]   Recommendations for Outpatient Follow-up:  1. Follow up with PCP in 1-2 weeks 2. Please obtain BMP/CBC in one week 3. Please follow up on the following pending results:  Home Health: None Equipment/Devices: None  Discharge Condition: Stable CODE STATUS: DNR Diet recommendation: Cardiac  Subjective: Patient seen and examined.  No complaints other than her chronic generalized body aches.  Brief/Interim Summary: [name] [name] a 73 y.o.femalewith medical history significant forParkinson's disease, recurrent UTIs on daily Keflex, history of recurrent PE on warfarin, hypothyroidism, and hypertension who presented to ED for evaluation of dysuria, bilateral flank pain, and increased confusion.  Flank pain and dysuria has been going on for 1 to 2 weeks associated with some nausea and decreased appetite and some loose stools.  She reports chronic cough but no fever or shortness of breath.  Upon arrival to the ED, patient is found to be afebrile, saturating well on room air, and with remaining vitals also normal. Chemistry panel is notable for sodium 131, BUN 29, and creatinine 1.32, up from 0.75 in June. CBC features a leukocytosis to 22,600. Lactic acid is reassuringly normal. INR was subtherapeutic at 1.7. Blood and urine cultures were collected in the emergency department, 1 L of IV fluids was given, patient was treated with Zosyn, and Covid testing was negative.  She was admitted by hospital service for possible UTI causing acute metabolic encephalopathy.  She was then started on Merrem due to previous history of ESBL E. coli UTI.  Patient's confusion improved within 24 hours and she remained alert and oriented  at her baseline for rest of the hospitalization.  Patient continued to complain of generalized body ache which was improving on daily basis and although she still has some generalized weakness but that is at baseline and is chronic due to her rheumatoid arthritis.  Her urine culture unfortunately turned out to be growing multiple organisms suggesting contamination.  By this time, patient had already received 3 days of IV antibiotics so it was not ideal to reflect her urine culture since it might have given [name] another false negative.  She was seen by PT OT and recommended skilled nursing facility discharge which has been arranged for the patient.  In lieu of patient having strong history of ESBL E. coli UTI in the past and the fact that she has only received 4 days of IV Merrem, I am discharging patient on 3 more days of IV ertapenem to complete a 7-day course empirically.  Discharge Diagnoses:  Principal Problem:   Complicated UTI (urinary tract infection) Active Problems:   Parkinson's disease ([place])   Benign essential HTN   Hypothyroidism   History of pulmonary embolus (PE) 2/2 hypercoagulable state   AKI (acute kidney injury) ([place])   Subtherapeutic international normalized ratio (INR)   Hyponatremia   Acute metabolic encephalopathy    Discharge Instructions  Discharge Instructions    Discharge patient   Complete by: As directed    Discharge disposition: 03-Skilled [place]   Discharge patient date: 01/07/2019   Home infusion instructions [place] May follow [place] Dosing Protocol; May administer Cathflo as needed to maintain patency of vascular access device.; [place]ushing of vascular access device: per [facility] Protocol: 0.9% NaCl pre/post medica...   Complete by: As  directed    Instructions: May follow [place] Dosing Protocol   Instructions: May administer Cathflo as needed to maintain patency of vascular access device.   Instructions: Flushing of vascular access  device: per [facility] Protocol: 0.9% NaCl pre/post medication administration and prn patency; Heparin 100 u/ml, 30m for implanted ports and Heparin 10u/ml, 551mfor all other central venous catheters.   Instructions: May follow [facility] Anaphylaxis Protocol for First Dose Administration in the home: 0.9% NaCl at 25-50 ml/hr to maintain IV access for protocol meds. Epinephrine 0.3 ml IV/IM PRN and Benadryl 25-50 IV/IM PRN s/s of anaphylaxis.   Instructions: AdLyfordnfusion Coordinator (RN) to assist per patient IV care needs in the home PRN.   Home infusion instructions [facility] May follow A[place]osing Protocol; May administer Cathflo as needed to maintain patency of vascular access device.; Flushing of vascular access device: per [facility]: 0.9% NaCl pre/post medica...   Complete by: As directed    Instructions: May follow ACR[place] Fieldsosing Protocol   Instructions: May administer Cathflo as needed to maintain patency of vascular access device.   Instructions: [place]ushing of vascular access device: per [facility]ocol: 0.9% NaCl pre/post medication administration and prn patency; Heparin 100 u/ml, 92m60mor implanted ports and Heparin 10u/ml, 92ml50mr all other central venous catheters.   Instructions: May follow [facility] Anaphylaxis Protocol for First Dose Administration in the home: 0.9% NaCl at 25-50 ml/hr to maintain IV access for protocol meds. Epinephrine 0.3 ml IV/IM PRN and Benadryl 25-50 IV/IM PRN s/s of anaphylaxis.   Instructions: AdvaBondvilleusion Coordinator (RN) to assist per patient IV care needs in the home PRN.     Allergies as of 01/07/2019      Reactions   Crestor [rosuvastatin Calcium] Other (See Comments)   Feeling very bad, aching all over.   Erythromycin Hives   Gabapentin Other (See Comments)   Blurred vision   Pregabalin Other (See Comments)   Crossed vision.   Pregabalin Other (See Comments)   Crossed vision. Unknown   Macrobid [nitrofurantoin] Hives    Losartan Itching      Medication List    TAKE these medications   acetaminophen 500 MG tablet Commonly known as: TYLENOL Take 500 mg by mouth every 6 (six) hours as needed for mild pain.   amLODipine 2.5 MG tablet Commonly known as: NORVASC Take 1 tablet (2.5 mg total) by mouth daily.   atorvastatin 10 MG tablet Commonly known as: LIPITOR Take 1 tablet (10 mg total) by mouth daily.   BIOFREEZE EX Apply 1 application topically daily as needed (for pain).   carbidopa-levodopa 25-100 MG tablet Commonly known as: SINEMET IR 2 in the morning, 1 in the afternoon, 1 in the evening What changed:   how much to take  how to take this  when to take this  additional instructions   cephALEXin 250 MG capsule Commonly known as: KEFLEX Take 1 capsule (250 mg total) by mouth daily.   ertapenem  IVPB Commonly known as: INVANZ Inject 1 g into the vein daily. Indication:  ESBL UTI Last Day of Therapy:  01/10/19 Labs - Once weekly:  CBC/D and BMP, Labs - Every other week:  ESR and CRP   levothyroxine 100 MCG tablet Commonly known as: SYNTHROID Take 1 tablet (100 mcg total) by mouth daily.   magnesium oxide 400 MG tablet Commonly known as: MAG-OX Take 0.5 tablets (200 mg total) by mouth daily.   Melatonin 5  MG Tabs Take 1 tablet by mouth at bedtime as needed (for sleep).   venlafaxine XR 150 MG 24 hr capsule Commonly known as: EFFEXOR-XR Take 1 capsule (150 mg total) by mouth daily with breakfast.   vitamin B-12 500 MCG tablet Commonly known as: CYANOCOBALAMIN Take 500 mcg by mouth daily.   Vitamin D3 125 MCG (5000 UT) Caps Take 5,000 Units by mouth daily.   warfarin 4 MG tablet Commonly known as: COUMADIN Take as directed. If you are unsure how to take this medication, talk to your nurse or doctor. Original instructions: TAKE AS DIRECTED BY COUMADIN CLINIC What changed:   how much to take  how to take this  when to take this  additional instructions             Home Infusion Instuctions  (From admission, onward)         Start     Ordered   01/07/19 0000  Home infusion instructions [facility] May follow [place] Dosing Protocol; May administer Cathflo as needed to maintain patency of vascular access device.; Flushing of vascular access device: per [facility] Protocol: 0.9% NaCl pre/post medica...    Question Answer Comment  Instructions May follow [place] Dosing Protocol   Instructions May administer Cathflo as needed to maintain patency of vascular access device.   Instructions Flushing of vascular access device: per [facility] Protocol: 0.9% NaCl pre/post medication administration and prn patency; Heparin 100 u/ml, 60m for implanted ports and Heparin 10u/ml, 574mfor all other central venous catheters.   Instructions May follow [facility] Anaphylaxis Protocol for First Dose Administration in the home: 0.9% NaCl at 25-50 ml/hr to maintain IV access for protocol meds. Epinephrine 0.3 ml IV/IM PRN and Benadryl 25-50 IV/IM PRN s/s of anaphylaxis.   Instructions [facility] Infusion Coordinator (RN) to assist per patient IV care needs in the home PRN.      01/07/19 0829   01/07/19 0000  Home infusion instructions [facility] May follow AC[place]well[facility] Millsosing Protocol; May administer Cathflo as needed to maintain patency of vascular access device.; Flushing of vascular access device: per [facility]: 0.9% NaCl pre/post medica...    Question Answer Comment  Instructions May follow [place] Protocol   Instructions May administer Cathflo as needed to maintain patency of vascular access device.   Instructions Flushing of vascular access device: per [facility]ocol: 0.9% NaCl pre/post medication administration and prn patency; Heparin 100 u/ml, 83m7mor implanted ports and Heparin 10u/ml, 83ml34mr all other central venous catheters.   Instructions May follow [facility] Anaphylaxis Protocol for First Dose Administration in the home: 0.9% NaCl at  25-50 ml/hr to maintain IV access for protocol meds. Epinephrine 0.3 ml IV/IM PRN and Benadryl 25-50 IV/IM PRN s/s of anaphylaxis.   Instructions [facility] Infusion Coordinator (RN) to assist per patient IV care needs in the home PRN.      01/07/19 0859         Follow-up Information    [name], JessGay Filler Follow up in 1 week(s).   Specialty: Family Medicine Contact information: [address] [phone]      Allergies  Allergen Reactions  . Crestor [Rosuvastatin Calcium] Other (See Comments)    Feeling very bad, aching all over.  . Erythromycin Hives  . Gabapentin Other (See Comments)    Blurred vision  . Pregabalin Other (See Comments)  Crossed vision.  . Pregabalin Other (See Comments)    Crossed vision. Unknown   . Macrobid [Nitrofurantoin] Hives  . Losartan Itching    Consultations: None   Procedures/Studies: Dg Pelvis 1-2 Views  Result Date: 01/03/2019 CLINICAL DATA:  Pt states that she has fallen "a few times over the past couple of weeks." She states she fell again today and complains of bilateral hip pain "in the joints." Pt also states "my kids tell me I have a UTI," and she says she has Parkinson's. EXAM: PELVIS - 1-2 VIEW COMPARISON:  12/17/2015 FINDINGS: No fracture or bone lesion. Hip joints are normally spaced and aligned minor marginal osteophytes from the bases of the femoral heads. No other degenerative change. SI joints and symphysis pubis are normally spaced and aligned. Skeletal structures are demineralized. Surrounding soft tissues are unremarkable. IMPRESSION: No fracture dislocation or acute finding Electronically Signed   By: Lajean Manes M.D.   On: 01/03/2019 18:57   Ct Head Wo Contrast  Result Date: 01/03/2019 CLINICAL DATA:  Altered LOC, multiple falls EXAM: CT HEAD WITHOUT CONTRAST TECHNIQUE: Contiguous axial images were obtained from the base of the skull through the vertex without  intravenous contrast. COMPARISON:  CT 02/11/2018 FINDINGS: Brain: No acute territorial infarction, hemorrhage, or intracranial mass. Mild atrophy. Stable ventricular enlargement out of proportion to atrophy. Moderate hypodensity in the white matter consistent with chronic small vessel ischemic change. Vascular: No hyperdense vessels.  Carotid vascular calcification Skull: Normal. Negative for fracture or focal lesion. Sinuses/Orbits: Mucosal thickening in the ethmoid sinuses Other: None IMPRESSION: 1. No CT evidence for acute intracranial abnormality. 2. Atrophy. Stable ventriculomegaly and moderate chronic small vessel ischemic changes of the white matter Electronically Signed   By: Donavan Foil M.D.   On: 01/03/2019 19:22     Discharge Exam: Vitals:   01/06/19 2116 01/07/19 0651  BP: (!) 142/60 (!) 144/70  Pulse: 76 77  Resp: 16 18  Temp: 97.6 F (36.4 C) 97.8 F (36.6 C)  SpO2: 96% 95%   Vitals:   01/06/19 0849 01/06/19 1542 01/06/19 2116 01/07/19 0651  BP: (!) 146/67 124/75 (!) 142/60 (!) 144/70  Pulse: 71 74 76 77  Resp: '18 18 16 18  ' Temp: 98 F (36.7 C)  97.6 F (36.4 C) 97.8 F (36.6 C)  TempSrc: Oral  Oral Oral  SpO2: 96% 96% 96% 95%  Weight:      Height:        General: Pt is alert, awake, not in acute distress Cardiovascular: RRR, S1/S2 +, no rubs, no gallops Respiratory: CTA bilaterally, no wheezing, no rhonchi Abdominal: Soft, NT, ND, bowel sounds + Extremities: no edema, no cyanosis    The results of significant diagnostics from this hospitalization (including imaging, microbiology, ancillary and laboratory) are listed below for reference.     Microbiology: Recent Results (from the past 240 hour(s))  Urine culture     Status: Abnormal   Collection Time: 01/03/19  5:52 PM   Specimen: Urine, Clean Catch  Result Value Ref Range Status   Specimen Description URINE, CLEAN CATCH  Final   Special Requests   Final    NONE Performed at [facility],  [address], [place], [place] [address]    Culture MULTIPLE SPECIES PRESENT, SUGGEST RECOLLECTION (A)  Final   Report Status 01/05/2019 FINAL  Final  Culture, blood (routine x 2)     Status: None (Preliminary result)   Collection Time: 01/03/19  6:31 PM  Specimen: BLOOD  Result Value Ref Range Status   Specimen Description BLOOD RIGHT ANTECUBITAL  Final   Special Requests   Final    BOTTLES DRAWN AEROBIC AND ANAEROBIC Blood Culture results may not be optimal due to an inadequate volume of blood received in culture bottles   Culture   Final    NO GROWTH 3 DAYS Performed at [facility], [place] [address], [place], [place] [address]    Report Status PENDING  Incomplete  SARS CORONAVIRUS 2 (TAT 6-24 HRS) Nasopharyngeal Nasopharyngeal Swab     Status: None   Collection Time: 01/03/19  8:23 PM   Specimen: Nasopharyngeal Swab  Result Value Ref Range Status   SARS Coronavirus 2 NEGATIVE NEGATIVE Final    Comment: (NOTE) SARS-CoV-2 target nucleic acids are NOT DETECTED. The SARS-CoV-2 RNA is generally detectable in upper and lower respiratory specimens during the acute phase of infection. Negative results do not preclude SARS-CoV-2 infection, do not rule out co-infections with other pathogens, and should not be used as the sole basis for treatment or other patient management decisions. Negative results must be combined with clinical observations, patient history, and epidemiological information. The expected result is Negative. Fact Sheet for Patients: SugarRoll.be Fact Sheet for Healthcare Providers: https://www.woods-mathews.com/ This test is not yet approved or cleared by the [place] FDA and  has been authorized for detection and/or diagnosis of SARS-CoV-2 by FDA under an Emergency Use Authorization (EUA). This EUA will remain  in effect (meaning this test can be used) for the duration of the COVID-19 declaration under Section  56 4(b)(1) of the Act, 21 U.S.C. section 360bbb-3(b)(1), unless the authorization is terminated or revoked sooner. Performed at [facility], [place] [address] [place], [place] [address]   Culture, blood (routine x 2)     Status: None (Preliminary result)   Collection Time: 01/03/19  9:08 PM   Specimen: Site Not Specified; Blood  Result Value Ref Range Status   Specimen Description SITE NOT SPECIFIED  Final   Special Requests   Final    BOTTLES DRAWN AEROBIC AND ANAEROBIC Blood Culture adequate volume   Culture   Final    NO GROWTH 3 DAYS Performed at [facility], [place] [address], [place],  [address]    Report Status PENDING  Incomplete  Novel Coronavirus, NAA (Hosp order, Send-out to Ref Lab; TAT 18-24 hrs     Status: None   Collection Time: 01/06/19 12:45 PM   Specimen: Nasopharyngeal Swab; Respiratory  Result Value Ref Range Status   SARS-CoV-2, NAA NOT DETECTED NOT DETECTED Final    Comment: (NOTE) This nucleic acid amplification test was developed and its performance characteristics determined by [employer]. Nucleic acid amplification tests include PCR and TMA. This test has not been FDA cleared or approved. This test has been authorized by FDA under an Emergency Use Authorization (EUA). This test is only authorized for the duration of time the declaration that circumstances exist justifying the authorization of the emergency use of in vitro diagnostic tests for detection of SARS-CoV-2 virus and/or diagnosis of COVID-19 infection under section 564(b)(1) of the Act, 21 U.S.C. 366QHU-7(M) (1), unless the authorization is terminated or revoked sooner. When diagnostic testing is negative, the possibility of a false negative result should be considered in the context of a patient's recent exposures and the presence of clinical signs and symptoms consistent with COVID-19. An individual without symptoms of COVID- 19 and who is not shedding SARS-CoV-2  vi rus would expect to have a negative (not detected) result in this assay. Performed At: [facility] [address] [place], [place] [id number] Rush Farmer MD ZY:[id number]    [place]  Final    Comment: Performed at [facility], [place] [address] [place], [place] [address]     Labs: BNP (last 3 results) No results for input(s): BNP in the last 8760 hours. Basic Metabolic Panel: Recent Labs  Lab 01/03/19 1555 01/04/19 0601 01/05/19 1154 01/06/19 0612  NA 131* 134* 138 139  K 4.5 3.3* 4.2 4.4  CL 94* 99 104 103  CO2 '25 22 25 26  ' GLUCOSE 94 85 125* 125*  BUN 29* 26* 18 18  CREATININE 1.32* 1.08* 0.85 0.85  CALCIUM 9.3 8.5* 8.9 8.9  MG  --   --  1.7  --    Liver Function Tests: Recent Labs  Lab 01/03/19 1747 01/04/19 0601 01/05/19 1154 01/06/19 0612  AST 38 41 35 25  ALT '5 5 7 6  ' ALKPHOS 131* 103 106 96  BILITOT 1.9* 1.1 0.4 0.4  PROT 7.0 5.6* 5.7* 5.5*  ALBUMIN 3.0* 2.3* 2.2* 2.1*   No results for input(s): LIPASE, AMYLASE in the last 168 hours. No results for input(s): AMMONIA in the last 168 hours. CBC: Recent Labs  Lab 01/03/19 1555 01/04/19 0601 01/05/19 1154 01/06/19 0612 01/07/19 0557  WBC 22.6* 15.2* 8.6 9.9 10.1  NEUTROABS  --  12.5*  --   --   --   HGB 13.8 12.4 13.2 13.5 13.2  HCT 41.5 37.8 40.2 41.8 40.1  MCV 88.9 88.5 88.7 89.3 87.4  PLT 200 220 218 254 288   Cardiac Enzymes: No results for input(s): CKTOTAL, CKMB, CKMBINDEX, TROPONINI in the last 168 hours. BNP: Invalid input(s): POCBNP CBG: No results for input(s): GLUCAP in the last 168 hours. D-Dimer No results for input(s): DDIMER in the last 72 hours. Hgb A1c No results for input(s): HGBA1C in the last 72 hours. Lipid Profile No results for input(s): CHOL, HDL, LDLCALC, TRIG, CHOLHDL, LDLDIRECT in the last 72 hours. Thyroid function studies No results for input(s): TSH, T4TOTAL, T3FREE, THYROIDAB in the last 72 hours.  Invalid  input(s): FREET3 Anemia work up No results for input(s): VITAMINB12, FOLATE, FERRITIN, TIBC, IRON, RETICCTPCT in the last 72 hours. Urinalysis    Component Value Date/Time   COLORURINE YELLOW 01/03/2019 1752   APPEARANCEUR CLEAR 01/03/2019 1752   LABSPEC <1.005 (L) 01/03/2019 1752   PHURINE 6.0 01/03/2019 1752   GLUCOSEU NEGATIVE 01/03/2019 1752   GLUCOSEU NEGATIVE 08/14/2017 1541   HGBUR LARGE (A) 01/03/2019 1752   BILIRUBINUR NEGATIVE 01/03/2019 1752   BILIRUBINUR 3+ 10/02/2017 1031   KETONESUR NEGATIVE 01/03/2019 1752   PROTEINUR NEGATIVE 01/03/2019 1752   UROBILINOGEN 4.0 (A) 10/02/2017 1031   UROBILINOGEN 0.2 08/14/2017 1541   NITRITE POSITIVE (A) 01/03/2019 1752   LEUKOCYTESUR TRACE (A) 01/03/2019 1752   Sepsis Labs Invalid input(s): PROCALCITONIN,  WBC,  LACTICIDVEN Microbiology Recent Results (from the past 240 hour(s))  Urine culture     Status: Abnormal   Collection Time: 01/03/19  5:52 PM   Specimen: Urine, Clean Catch  Result Value Ref Range Status   Specimen Description URINE, CLEAN CATCH  Final   Special Requests   Final    NONE Performed at [facility], [place] [address], [place], [place] [address]    Culture MULTIPLE SPECIES PRESENT, SUGGEST RECOLLECTION (A)  Final   Report Status 01/05/2019  FINAL  Final  Culture, blood (routine x 2)     Status: None (Preliminary result)   Collection Time: 01/03/19  6:31 PM   Specimen: BLOOD  Result Value Ref Range Status   Specimen Description BLOOD RIGHT ANTECUBITAL  Final   Special Requests   Final    BOTTLES DRAWN AEROBIC AND ANAEROBIC Blood Culture results may not be optimal due to an inadequate volume of blood received in culture bottles   Culture   Final    NO GROWTH 3 DAYS Performed at [facility], [place] [address] [place], [place] [address]    Report Status PENDING  Incomplete  SARS CORONAVIRUS 2 (TAT 6-24 HRS) Nasopharyngeal Nasopharyngeal Swab     Status: None   Collection Time: 01/03/19  8:23 PM    Specimen: Nasopharyngeal Swab  Result Value Ref Range Status   SARS Coronavirus 2 NEGATIVE NEGATIVE Final    Comment: (NOTE) SARS-CoV-2 target nucleic acids are NOT DETECTED. The SARS-CoV-2 RNA is generally detectable in upper and lower respiratory specimens during the acute phase of infection. Negative results do not preclude SARS-CoV-2 infection, do not rule out co-infections with other pathogens, and should not be used as the sole basis for treatment or other patient management decisions. Negative results must be combined with clinical observations, patient history, and epidemiological information. The expected result is Negative. Fact Sheet for Patients: SugarRoll.be Fact Sheet for Healthcare Providers: https://www.woods-mathews.com/ This test is not yet approved or cleared by the [place] FDA and  has been authorized for detection and/or diagnosis of SARS-CoV-2 by FDA under an Emergency Use Authorization (EUA). This EUA will remain  in effect (meaning this test can be used) for the duration of the COVID-19 declaration under Section 56 4(b)(1) of the Act, 21 U.S.C. section 360bbb-3(b)(1), unless the authorization is terminated or revoked sooner. Performed at [facility], [place] [address], [place], [place] [address]   Culture, blood (routine x 2)     Status: None (Preliminary result)   Collection Time: 01/03/19  9:08 PM   Specimen: Site Not Specified; Blood  Result Value Ref Range Status   Specimen Description SITE NOT SPECIFIED  Final   Special Requests   Final    BOTTLES DRAWN AEROBIC AND ANAEROBIC Blood Culture adequate volume   Culture   Final    NO GROWTH 3 DAYS Performed at [facility], [place] [address] [place], [place] [address]    Report Status PENDING  Incomplete  Novel Coronavirus, NAA (Hosp order, Send-out to Ref Lab; TAT 18-24 hrs     Status: None   Collection Time: 01/06/19 12:45 PM   Specimen:  Nasopharyngeal Swab; Respiratory  Result Value Ref Range Status   SARS-CoV-2, NAA NOT DETECTED NOT DETECTED Final    Comment: (NOTE) This nucleic acid amplification test was developed and its performance characteristics determined by [employer]. Nucleic acid amplification tests include PCR and TMA. This test has not been FDA cleared or approved. This test has been authorized by FDA under an Emergency Use Authorization (EUA). This test is only authorized for the duration of time the declaration that circumstances exist justifying the authorization of the emergency use of in vitro diagnostic tests for detection of SARS-CoV-2 virus and/or diagnosis of COVID-19 infection under section 564(b)(1) of the Act, 21 U.S.C. 751WCH-8(N) (1), unless the authorization is terminated or revoked sooner. When diagnostic testing is negative, the possibility of a false negative result should be considered in the context of a  patient's recent exposures and the presence of clinical signs and symptoms consistent with COVID-19. An individual without symptoms of COVID- 19 and who is not shedding SARS-CoV-2 vi rus would expect to have a negative (not detected) result in this assay. Performed At: [facility] [address] [place], [place] [id number] Rush Farmer MD DX:[id number]    [place]  Final    Comment: Performed at [facility], [place] [address], [place], [place] [address]     Time coordinating discharge: Over 30 minutes  SIGNED:   Darliss Cheney, MD  [facility] 01/07/2019, 9:02 AM  If 7PM-7AM, please contact night-coverage [email or web address] Password [email or web address]

## 2019-01-07 NOTE — Plan of Care (Signed)
  Problem: Education: Goal: Knowledge of General Education information will improve Description: Including pain rating scale, medication(s)/side effects and non-pharmacologic comfort measures Outcome: Adequate for Discharge   Problem: Health Behavior/Discharge Planning: Goal: Ability to manage health-related needs will improve Outcome: Adequate for Discharge   Problem: Clinical Measurements: Goal: Ability to maintain clinical measurements within normal limits will improve Outcome: Adequate for Discharge   Problem: Clinical Measurements: Goal: Will remain free from infection Outcome: Adequate for Discharge   Problem: Clinical Measurements: Goal: Diagnostic test results will improve Outcome: Adequate for Discharge   Problem: Clinical Measurements: Goal: Respiratory complications will improve Outcome: Adequate for Discharge   Problem: Clinical Measurements: Goal: Cardiovascular complication will be avoided Outcome: Adequate for Discharge   Problem: Activity: Goal: Risk for activity intolerance will decrease Outcome: Adequate for Discharge   Problem: Nutrition: Goal: Adequate nutrition will be maintained Outcome: Adequate for Discharge   Problem: Coping: Goal: Level of anxiety will decrease Outcome: Adequate for Discharge   Problem: Pain Managment: Goal: General experience of comfort will improve Outcome: Adequate for Discharge   Problem: Safety: Goal: Ability to remain free from injury will improve Outcome: Adequate for Discharge   Problem: Safety: Goal: Ability to remain free from injury will improve Outcome: Adequate for Discharge   Problem: Skin Integrity: Goal: Risk for impaired skin integrity will decrease Outcome: Adequate for Discharge

## 2019-01-07 NOTE — Discharge Instructions (Signed)
Urinary Tract Infection, Adult A urinary tract infection (UTI) is an infection of any part of the urinary tract. The urinary tract includes:  The kidneys.  The ureters.  The bladder.  The urethra. These organs make, store, and get rid of pee (urine) in the body. What are the causes? This is caused by germs (bacteria) in your genital area. These germs grow and cause swelling (inflammation) of your urinary tract. What increases the risk? You are more likely to develop this condition if:  You have a small, thin tube (catheter) to drain pee.  You cannot control when you pee or poop (incontinence).  You are female, and: ? You use these methods to prevent pregnancy: ? A medicine that kills sperm (spermicide). ? A device that blocks sperm (diaphragm). ? You have low levels of a female hormone (estrogen). ? You are pregnant.  You have genes that add to your risk.  You are sexually active.  You take antibiotic medicines.  You have trouble peeing because of: ? A prostate that is bigger than normal, if you are female. ? A blockage in the part of your body that drains pee from the bladder (urethra). ? A kidney stone. ? A nerve condition that affects your bladder (neurogenic bladder). ? Not getting enough to drink. ? Not peeing often enough.  You have other conditions, such as: ? Diabetes. ? A weak disease-fighting system (immune system). ? Sickle cell disease. ? Gout. ? Injury of the spine. What are the signs or symptoms? Symptoms of this condition include:  Needing to pee right away (urgently).  Peeing often.  Peeing small amounts often.  Pain or burning when peeing.  Blood in the pee.  Pee that smells bad or not like normal.  Trouble peeing.  Pee that is cloudy.  Fluid coming from the vagina, if you are female.  Pain in the belly or lower back. Other symptoms include:  Throwing up (vomiting).  No urge to eat.  Feeling mixed up (confused).  Being tired  and grouchy (irritable).  A fever.  Watery poop (diarrhea). How is this treated? This condition may be treated with:  Antibiotic medicine.  Other medicines.  Drinking enough water. Follow these instructions at home:  Medicines  Take over-the-counter and prescription medicines only as told by your doctor.  If you were prescribed an antibiotic medicine, take it as told by your doctor. Do not stop taking it even if you start to feel better. General instructions  Make sure you: ? Pee until your bladder is empty. ? Do not hold pee for a long time. ? Empty your bladder after sex. ? Wipe from front to back after pooping if you are a female. Use each tissue one time when you wipe.  Drink enough fluid to keep your pee pale yellow.  Keep all follow-up visits as told by your doctor. This is important. Contact a doctor if:  You do not get better after 1-2 days.  Your symptoms go away and then come back. Get help right away if:  You have very bad back pain.  You have very bad pain in your lower belly.  You have a fever.  You are sick to your stomach (nauseous).  You are throwing up. Summary  A urinary tract infection (UTI) is an infection of any part of the urinary tract.  This condition is caused by germs in your genital area.  There are many risk factors for a UTI. These include having a small, thin   tube to drain pee and not being able to control when you pee or poop.  Treatment includes antibiotic medicines for germs.  Drink enough fluid to keep your pee pale yellow. This information is not intended to replace advice given to you by your health care provider. Make sure you discuss any questions you have with your health care provider. Document Released: 08/09/2007 Document Revised: 02/07/2018 Document Reviewed: 08/30/2017 Elsevier Patient Education  2020 [employer]  

## 2019-01-07 NOTE — Progress Notes (Signed)
PT Cancellation Note  Patient Details Name: [name] [name] MRN: [id number] DOB: 14-Jan-1946   Cancelled Treatment:    Reason Eval/Treat Not Completed: Other (comment). Pt with d/c orders, pt packed and RN reports awaiting [facility] transport to SNF at this time.  Will hold off at this time.   Galen Manila 01/07/2019, 2:01 PM

## 2019-01-07 NOTE — Progress Notes (Signed)
ANTICOAGULATION CONSULT NOTE - Follow Up Consult  Pharmacy Consult for Coumadin Indication: Hx recurrent PE  Allergies  Allergen Reactions  . Crestor [Rosuvastatin Calcium] Other (See Comments)    Feeling very bad, aching all over.  . Erythromycin Hives  . Gabapentin Other (See Comments)    Blurred vision  . Pregabalin Other (See Comments)    Crossed vision.  . Pregabalin Other (See Comments)    Crossed vision. Unknown   . Macrobid [Nitrofurantoin] Hives  . Losartan Itching    Patient Measurements: Height: 5\' 6"  (167.6 cm) Weight: 175 lb (79.4 kg) IBW/kg (Calculated) : 59.3 Heparin Dosing Weight: 75 kg  Vital Signs: Temp: 97.8 F (36.6 C) (11/03 0651) Temp Source: Oral (11/03 0651) BP: 144/70 (11/03 0651) Pulse Rate: 77 (11/03 0651)  Labs: Recent Labs    01/05/19 1154 01/06/19 0612 01/06/19 0806 01/07/19 0557  HGB 13.2 13.5  --  13.2  HCT 40.2 41.8  --  40.1  PLT 218 254  --  288  LABPROT 28.9*  --  26.0* 24.7*  INR 2.8*  --  2.4* 2.3*  CREATININE 0.85 0.85  --   --     Estimated Creatinine Clearance: 60.8 mL/min (by C-G formula based on SCr of 0.85 mg/dL).   Medications:  Infusions:  . meropenem (MERREM) IV 1 g (01/07/19 0109)    Assessment: 73 yo female on chronic Coumadin (4mg  PO daily) for hx recurrent PE admitted with slightly subtherapeutic INR (1.7) on admission. Today, INR is therapeutic at 2.3.    Goal of Therapy:  INR 2-3 Monitor platelets by anticoagulation protocol: Yes   Plan:  Repeat  Warfarin 4 mg po x 1 tonight. Daily monitor for s/sx of bleeding, INR and CBC.  Thank you Anette Guarneri, PharmD Please utilize Amion for appropriate phone number to reach the unit pharmacist ([place])    01/07/2019 7:44 AM

## 2019-01-08 DIAGNOSIS — N390 Urinary tract infection, site not specified: Secondary | ICD-10-CM | POA: Diagnosis not present

## 2019-01-08 DIAGNOSIS — B9689 Other specified bacterial agents as the cause of diseases classified elsewhere: Secondary | ICD-10-CM | POA: Diagnosis not present

## 2019-01-08 DIAGNOSIS — G20 Parkinson's disease: Secondary | ICD-10-CM | POA: Diagnosis not present

## 2019-01-08 DIAGNOSIS — I2699 Other pulmonary embolism without acute cor pulmonale: Secondary | ICD-10-CM | POA: Diagnosis not present

## 2019-01-08 LAB — CULTURE, BLOOD (ROUTINE X 2)
Culture: NO GROWTH
Culture: NO GROWTH
Special Requests: ADEQUATE

## 2019-01-10 DIAGNOSIS — J45998 Other asthma: Secondary | ICD-10-CM | POA: Diagnosis not present

## 2019-01-10 DIAGNOSIS — R5381 Other malaise: Secondary | ICD-10-CM | POA: Diagnosis not present

## 2019-01-10 DIAGNOSIS — F320 Major depressive disorder, single episode, mild: Secondary | ICD-10-CM | POA: Diagnosis not present

## 2019-01-10 DIAGNOSIS — G243 Spasmodic torticollis: Secondary | ICD-10-CM | POA: Diagnosis not present

## 2019-01-10 DIAGNOSIS — E7849 Other hyperlipidemia: Secondary | ICD-10-CM | POA: Diagnosis not present

## 2019-01-10 DIAGNOSIS — N390 Urinary tract infection, site not specified: Secondary | ICD-10-CM | POA: Diagnosis not present

## 2019-01-10 DIAGNOSIS — I10 Essential (primary) hypertension: Secondary | ICD-10-CM | POA: Diagnosis not present

## 2019-01-10 DIAGNOSIS — I2699 Other pulmonary embolism without acute cor pulmonale: Secondary | ICD-10-CM | POA: Diagnosis not present

## 2019-01-10 DIAGNOSIS — E038 Other specified hypothyroidism: Secondary | ICD-10-CM | POA: Diagnosis not present

## 2019-01-10 DIAGNOSIS — G20 Parkinson's disease: Secondary | ICD-10-CM | POA: Diagnosis not present

## 2019-01-15 DIAGNOSIS — N390 Urinary tract infection, site not specified: Secondary | ICD-10-CM | POA: Diagnosis not present

## 2019-01-15 DIAGNOSIS — E038 Other specified hypothyroidism: Secondary | ICD-10-CM | POA: Diagnosis not present

## 2019-01-16 ENCOUNTER — Encounter: Payer: Self-pay | Admitting: Family Medicine

## 2019-01-24 DIAGNOSIS — I2699 Other pulmonary embolism without acute cor pulmonale: Secondary | ICD-10-CM | POA: Diagnosis not present

## 2019-01-24 DIAGNOSIS — I10 Essential (primary) hypertension: Secondary | ICD-10-CM | POA: Diagnosis not present

## 2019-01-24 DIAGNOSIS — G20 Parkinson's disease: Secondary | ICD-10-CM | POA: Diagnosis not present

## 2019-01-24 DIAGNOSIS — E039 Hypothyroidism, unspecified: Secondary | ICD-10-CM | POA: Diagnosis not present

## 2019-01-28 NOTE — Progress Notes (Addendum)
[facility] [place], [place], [place] [address] [phone]  Date:  01/29/2019   Name:  [name] [name]   DOB:  11/11/1945   MRN:  [id number]  PCP:  Darreld Mclean, MD    Chief Complaint: Hospitalization Follow-up (uti), Flu Vaccine, and Coagulation Disorder   History of Present Illness:  [name] [name] is a 73 y.o. very pleasant female patient who presents with the following:  Here today for a hospital follow-up visit She has a somewhat complex medical history, including Parkinson's disease, recurrent UTI, pulmonary embolism due to hypercoagulable state on chronic Coumadin, gait disturbance, TIA, hypertension She has a home INR machine which she uses to monitor her Coumadin  She was recently admitted for 3 days with a complicated UTI  Admit date: 01/03/2019 Discharge date: 01/07/2019 Admitted From: [facility] home Disposition:  [facility] Recommendations for Outpatient Follow-up:  1. Follow up with PCP in 1-2 weeks 2. Please obtain BMP/CBC in one week 3. Please follow up on the following pending results: Brief/Interim Summary: [name] [name] a 73 y.o.femalewith medical history significant forParkinson's disease, recurrent UTIs on daily Keflex, history of recurrent PE on warfarin, hypothyroidism, and hypertension who presented to ED for evaluation of dysuria, bilateral flank pain, and increased confusion. Flank pain and dysuria has been going on for 1 to 2 weeks associated with some nausea and decreased appetite and some loose stools. She reports chronic cough but no fever or shortness of breath.  Upon arrival to the ED, patient is found to be afebrile, saturating well on room air, and with remaining vitals also normal. Chemistry panel is notable for sodium 131, BUN 29, and creatinine 1.32, up from 0.75 in June. CBC features a leukocytosis to 22,600.  Lactic acid is reassuringly normal. INR was subtherapeutic at 1.7. Blood and urine cultures were collected in the emergency department, 1 L of IV fluids was given, patient was treated with Zosyn, and Covid testing was negative. She was admitted by hospital service for possible UTI causing acute metabolic encephalopathy.  She was then started on Merrem due to previous history of ESBL E. coli UTI.  Patient's confusion improved within 24 hours and she remained alert and oriented at her baseline for rest of the hospitalization.  Patient continued to complain of generalized body ache which was improving on daily basis and although she still has some generalized weakness but that is at baseline and is chronic due to her rheumatoid arthritis.  Her urine culture unfortunately turned out to be growing multiple organisms suggesting contamination.  By this time, patient had already received 3 days of IV antibiotics so it was not ideal to reflect her urine culture since it might have given [name] another false negative.  She was seen by PT OT and recommended skilled nursing facility discharge which has been arranged for the patient.  In lieu of patient having strong history of ESBL E. coli UTI in the past and the fact that she has only received 4 days of IV Merrem, I am discharging patient on 3 more days of IV ertapenem to complete a 7-day course empirically.  Discharge Diagnoses:  Principal Problem:   Complicated UTI (urinary tract infection) Active Problems:   Parkinson's disease ([place])   Benign essential HTN   Hypothyroidism   History of pulmonary embolus (PE) 2/2 hypercoagulable state   AKI (acute kidney injury) ([place])   Subtherapeutic international normalized ratio (  INR)   Hyponatremia   Acute metabolic encephalopathy   She is still taking keflex for UTI prophylaxis- 250 daily  She continues to feel "yucky"-she cannot describe in greater detail, but still does not feel quite well No fever She may notice  some mild dysuria off and on  Her appetite is not great She lost some weight per her report No belly pain No cough  No shortness of breath  Her daughter drove her to clinic today   Wt Readings from Last 3 Encounters:  01/29/19 182 lb (82.6 kg)  01/03/19 175 lb (79.4 kg)  09/16/18 176 lb (79.8 kg)     Patient Active Problem List   Diagnosis Date Noted  . Acute metabolic encephalopathy 53/74/8270  . Complicated UTI (urinary tract infection) 01/03/2019  . Subtherapeutic international normalized ratio (INR) 01/03/2019  . Hyponatremia 01/03/2019  . Pre-diabetes 08/28/2018  . Adenoma of right adrenal gland 02/20/2018  . Osteopenia 12/18/2017  . Glaucoma 10/13/2017  . Transient cerebral ischemic attack 10/13/2017  . History of prediabetes 10/13/2017  . AKI (acute kidney injury) ([place]) 02/12/2017  . Recurrent UTI 11/23/2016  . Fibromyalgia 11/23/2016  . Pulmonary HTN ([place]) 11/23/2016  . Left ventricular diastolic dysfunction 78/67/5449  . History of pulmonary embolus (PE) 2/2 hypercoagulable state 11/23/2016  . Sepsis ([place]) 09/17/2016  . SIRS (systemic inflammatory response syndrome) ([place]) 04/29/2016  . Hyperlipidemia 04/29/2016  . Hypothyroidism 04/29/2016  . Hematuria 04/05/2016  . Benign essential HTN   . Displaced fracture of fifth cervical vertebra ([place]) 12/17/2015  . Gait disturbance 12/17/2015  . Trochanteric bursitis of both hips 09/13/2015  . Chronic low back pain 06/07/2015  . Parkinson's disease ([place]) 05/17/2015  . S/P right TKA 05/22/2011    Past Medical History:  Diagnosis Date  . Arthritis   . Chronic insomnia   . Complication of anesthesia    severe itching night of surgery requiring meds- also couldnt swallow- throat "paralyzed""  . Fibromyalgia   . Glaucoma   . Hypercholesterolemia   . Hyperlipidemia   . Hypertension    PCP Dr Cari Caraway- [place]  05/15/11 with clearance and note on chart,    chest x ray, EKG 12/12 [facility], eccho 7/11 [facility]  .  Hypothyroidism    s/p graves disease  . Kidney stone   . PD (Parkinson's disease) ([place])   . Peripheral vascular disease ([place])    PULMONARY EMBOLUS x 2- 2011, 2012/ FOLLOWED BY DR [id number]-LOV 12/12 [facility]   PT STAES WILL STOP COUMADIN 3/12 and begin LOVONOX 05/17/11 as previously instructed  . Prediabetes   . Sinus infection    at present- dr Honor Loh PA aware- was seen there and at PCP 05/10/11  . Thyroid disease    Hypothyroidism    Past Surgical History:  Procedure Laterality Date  . ABDOMINAL HYSTERECTOMY    . APPENDECTOMY    . CATARACT EXTRACTION Bilateral   . CHOLECYSTECTOMY    . COLONOSCOPY    . EXPLORATORY LAPAROTOMY    . JOINT REPLACEMENT     left knee  7/12  . TOTAL KNEE ARTHROPLASTY  05/22/2011   Procedure: TOTAL KNEE ARTHROPLASTY;  Surgeon: Mauri Pole, MD;  Location: [facility];  Service: Orthopedics;  Laterality: Right;    Social History   Tobacco Use  . Smoking status: Former Smoker    Packs/day: 1.00    Quit date: 05/10/1989    Years since quitting: 29.7  . Smokeless tobacco: Never Used  Substance Use Topics  . Alcohol  use: Yes    Alcohol/week: 0.0 standard drinks    Comment: one drink once a month  . Drug use: No    Family History  Problem Relation Age of Onset  . Prostate cancer Father   . Stroke Father     Allergies  Allergen Reactions  . Crestor [Rosuvastatin Calcium] Other (See Comments)    Feeling very bad, aching all over.  . Erythromycin Hives  . Gabapentin Other (See Comments)    Blurred vision  . Pregabalin Other (See Comments)    Crossed vision.  . Pregabalin Other (See Comments)    Crossed vision. Unknown   . Macrobid [Nitrofurantoin] Hives  . Losartan Itching    Medication list has been reviewed and updated.  Current Outpatient Medications on File Prior to Visit  Medication Sig Dispense Refill  . acetaminophen (TYLENOL) 500 MG tablet Take 500 mg by mouth every 6 (six) hours as needed for mild pain.    Marland Kitchen amLODipine (NORVASC) 2.5 MG  tablet Take 1 tablet (2.5 mg total) by mouth daily. 90 tablet 3  . atorvastatin (LIPITOR) 10 MG tablet Take 1 tablet (10 mg total) by mouth daily. 90 tablet 3  . carbidopa-levodopa (SINEMET IR) 25-100 MG tablet 2 in the morning, 1 in the afternoon, 1 in the evening (Patient taking differently: Take 1 tablet by mouth See admin instructions. Take 2 tablets by mouth in the morning, then take 1 tablet by mouth in the afternoon, 1 tablet by mouth in the evening) 360 tablet 1  . cephALEXin (KEFLEX) 250 MG capsule Take 1 capsule (250 mg total) by mouth daily. 90 capsule 3  . Cholecalciferol (VITAMIN D3) 5000 UNITS CAPS Take 5,000 Units by mouth daily.     . cyanocobalamin 500 MCG tablet Take 500 mcg by mouth daily.    . ertapenem (INVANZ) IVPB Inject 1 g into the vein daily. Indication:  ESBL UTI Last Day of Therapy:  01/10/19 Labs - Once weekly:  CBC/D and BMP, Labs - Every other week:  ESR and CRP 4 Units 0  . levothyroxine (SYNTHROID) 100 MCG tablet Take 1 tablet (100 mcg total) by mouth daily. 90 tablet 3  . magnesium oxide (MAG-OX) 400 MG tablet Take 0.5 tablets (200 mg total) by mouth daily. 45 tablet 3  . Melatonin 5 MG TABS Take 1 tablet by mouth at bedtime as needed (for sleep).    . Menthol, Topical Analgesic, (BIOFREEZE EX) Apply 1 application topically daily as needed (for pain).    Marland Kitchen venlafaxine XR (EFFEXOR-XR) 150 MG 24 hr capsule Take 1 capsule (150 mg total) by mouth daily with breakfast. 90 capsule 3  . warfarin (COUMADIN) 4 MG tablet TAKE AS DIRECTED BY COUMADIN CLINIC (Patient taking differently: Take 4 mg by mouth daily. ) 90 tablet 1   No current facility-administered medications on file prior to visit.     Review of Systems:  As per HPI- otherwise negative.  No fever or chills Physical Examination: Vitals:   01/29/19 1403  BP: 136/82  Pulse: 92  Resp: 17  Temp: (!) 96.6 F (35.9 C)  SpO2: 94%   Vitals:   01/29/19 1403  Weight: 182 lb (82.6 kg)  Height: '5\' 6"'  (1.676  m)   Body mass index is 29.38 kg/m. Ideal Body Weight: Weight in (lb) to have BMI = 25: 154.6  GEN: WDWN, NAD, Non-toxic, A & O x 3, overweight, appears her normal self HEENT: Atraumatic, Normocephalic. Neck supple. No masses, No LAD. Ears and  Nose: No external deformity. CV: RRR, No M/G/R. No JVD. No thrill. No extra heart sounds. PULM: CTA B, no wheezes, crackles, rhonchi. No retractions. No resp. distress. No accessory muscle use. ABD: S, NT, ND EXTR: No c/c/e NEURO Normal gait for patient, she has Parkinson's disease and uses a walker PSYCH: Normally interactive. Conversant. Not depressed or anxious appearing.  Calm demeanor.   Results for orders placed or performed in visit on 01/29/19  POCT INR  Result Value Ref Range   INR 1.6 (A) 2.0 - 3.0  POCT urinalysis dipstick  Result Value Ref Range   Color, UA yellow yellow   Clarity, UA cloudy (A) clear   Glucose, UA negative negative mg/dL   Bilirubin, UA negative negative   Ketones, POC UA negative negative mg/dL   Spec Grav, UA 1.015 1.010 - 1.025   Blood, UA trace-intact (A) negative   pH, UA 6.5 5.0 - 8.0   Protein Ur, POC =30 (A) negative mg/dL   Urobilinogen, UA 0.2 0.2 or 1.0 E.U./dL   Nitrite, UA Negative Negative   Leukocytes, UA Large (3+) (A) Negative    Assessment and Plan: Hospital discharge follow-up - Plan: POCT INR, Basic metabolic panel, CBC, CANCELED: CBC, CANCELED: Basic metabolic panel  Parkinson disease ([facility])  Gait disorder  Essential hypertension  Complicated UTI (urinary tract infection) - Plan: POCT urinalysis dipstick, Urine Microscopic Only, CANCELED: Urine Microscopic Only  History of pulmonary embolus (PE) 2/2 hypercoagulable state  Encounter for current long-term use of anticoagulants - Plan: POCT INR, CBC, CANCELED: CBC  Microhematuria - Plan: Urine culture, Urine Microscopic Only, CANCELED: Urine culture, CANCELED: Urine Microscopic Only  History of weight change  Diastolic  dysfunction  Here today for hospital follow-up visit Melessa was recently admitted with a complicated UTI.  Unfortunately her urine culture in the hospital grew mixed flora.  I will repeat her culture today.  Also she has microhematuria, will perform urine microscope exam. Follow-up on CBC and BMP today Point-of-care INR slightly subtherapeutic She is taking 4 mg of coumadin 7 days a week  7x4-= 28 mg weekly Increase by approximately 10%- take 6 mg once weekly and 4 mg 6 days = 30 mg weekly Given her risk of falls, would rather have her INR slightly too low than too high She will check her home INR in 1 week and will alert me if out of range  Flu shot given today I also gave her a letter for her pharmacist clearing her for shingles vaccine Will plan further follow- up pending labs.   This visit occurred during the SARS-CoV-2 public health emergency.  Safety protocols were in place, including screening questions prior to the visit, additional usage of staff PPE, and extensive cleaning of exam room while observing appropriate contact time as indicated for disinfecting solutions.    Signed Lamar Blinks, MD  Received her labs 11/27- called pt She does have an e coli UTI Will start on augmentin 500 BID for 10 days- called in now, please start today if possible Otherwise labs are ok Lab message to pt    Results for orders placed or performed in visit on 01/29/19  Urine culture   Specimen: Blood  Result Value Ref Range   MICRO NUMBER: [id number]    SPECIMEN QUALITY: Adequate    Sample Source NOT GIVEN    STATUS: FINAL    ISOLATE 1: ESBL Escherichia coli (A)       Susceptibility   Esbl escherichia coli - URINE CULTURE,  REFLEX    AMOX/CLAVULANIC 8 Sensitive     AMPICILLIN* >=32 Resistant      * Extended spectrum beta-lactamase (ESBL) producingorganisms demonstrate decreased activity withpenicillins, cephalosporins and aztreonam.    AMPICILLIN/SULBACTAM >=32 Resistant      CEFAZOLIN* >=64 Resistant      * Extended spectrum beta-lactamase (ESBL) producingorganisms demonstrate decreased activity withpenicillins, cephalosporins and aztreonam.For uncomplicated UTI caused by E. coli,K. pneumoniae or P. mirabilis: Cefazolin issusceptible if MIC <32 mcg/mL and predictssusceptible to the oral agents cefaclor, cefdinir,cefpodoxime, cefprozil, cefuroxime, cephalexinand loracarbef.    CEFEPIME >=64 Resistant     CEFTRIAXONE >=64 Resistant     CIPROFLOXACIN >=4 Resistant     LEVOFLOXACIN >=8 Resistant     ERTAPENEM <=0.5 Sensitive     GENTAMICIN <=1 Sensitive     IMIPENEM <=0.25 Sensitive     NITROFURANTOIN <=16 Sensitive     PIP/TAZO >=128 Resistant     TOBRAMYCIN <=1 Sensitive     TRIMETH/SULFA* >=320 Resistant      * Extended spectrum beta-lactamase (ESBL) producingorganisms demonstrate decreased activity withpenicillins, cephalosporins and aztreonam.For uncomplicated UTI caused by E. coli,K. pneumoniae or P. mirabilis: Cefazolin issusceptible if MIC <32 mcg/mL and predictssusceptible to the oral agents cefaclor, cefdinir,cefpodoxime, cefprozil, cefuroxime, cephalexinand loracarbef.Legend:S = Susceptible  I = IntermediateR = Resistant  NS = Not susceptible* = Not tested  NR = Not reported**NN = See antimicrobic comments  Basic metabolic panel  Result Value Ref Range   Glucose, Bld 87 65 - 99 mg/dL   BUN 9 7 - 25 mg/dL   Creat 0.70 0.60 - 0.93 mg/dL   BUN/Creatinine Ratio NOT APPLICABLE 6 - 22 (calc)   Sodium 142 135 - 146 mmol/L   Potassium 4.0 3.5 - 5.3 mmol/L   Chloride 103 98 - 110 mmol/L   CO2 26 20 - 32 mmol/L   Calcium 9.6 8.6 - 10.4 mg/dL  CBC  Result Value Ref Range   WBC 7.7 3.8 - 10.8 Thousand/uL   RBC 4.79 3.80 - 5.10 Million/uL   Hemoglobin 13.9 11.7 - 15.5 g/dL   HCT 40.9 35.0 - 45.0 %   MCV 85.4 80.0 - 100.0 fL   MCH 29.0 27.0 - 33.0 pg   MCHC 34.0 32.0 - 36.0 g/dL   RDW 12.3 11.0 - 15.0 %   Platelets 348 140 - 400 Thousand/uL   MPV 9.6 7.5 -  12.5 fL  Urine Microscopic Only  Result Value Ref Range   WBC, UA > OR = 60 (A) 0 - 5 /HPF   RBC / HPF 0-2 0 - 2 /HPF   Squamous Epithelial / LPF NONE SEEN < OR = 5 /HPF   Bacteria, UA MANY (A) NONE SEEN /HPF   Hyaline Cast NONE SEEN NONE SEEN /LPF  POCT INR  Result Value Ref Range   INR 1.6 (A) 2.0 - 3.0  POCT urinalysis dipstick  Result Value Ref Range   Color, UA yellow yellow   Clarity, UA cloudy (A) clear   Glucose, UA negative negative mg/dL   Bilirubin, UA negative negative   Ketones, POC UA negative negative mg/dL   Spec Grav, UA 1.015 1.010 - 1.025   Blood, UA trace-intact (A) negative   pH, UA 6.5 5.0 - 8.0   Protein Ur, POC =30 (A) negative mg/dL   Urobilinogen, UA 0.2 0.2 or 1.0 E.U./dL   Nitrite, UA Negative Negative   Leukocytes, UA Large (3+) (A) Negative

## 2019-01-29 ENCOUNTER — Encounter: Payer: Self-pay | Admitting: Family Medicine

## 2019-01-29 ENCOUNTER — Telehealth: Payer: Self-pay | Admitting: Family Medicine

## 2019-01-29 ENCOUNTER — Other Ambulatory Visit: Payer: Self-pay

## 2019-01-29 ENCOUNTER — Ambulatory Visit (INDEPENDENT_AMBULATORY_CARE_PROVIDER_SITE_OTHER): Payer: Medicare Other | Admitting: Family Medicine

## 2019-01-29 VITALS — BP 136/82 | HR 92 | Temp 96.6°F | Resp 17 | Ht 66.0 in | Wt 182.0 lb

## 2019-01-29 DIAGNOSIS — R296 Repeated falls: Secondary | ICD-10-CM | POA: Diagnosis not present

## 2019-01-29 DIAGNOSIS — I5032 Chronic diastolic (congestive) heart failure: Secondary | ICD-10-CM | POA: Diagnosis not present

## 2019-01-29 DIAGNOSIS — E039 Hypothyroidism, unspecified: Secondary | ICD-10-CM | POA: Diagnosis not present

## 2019-01-29 DIAGNOSIS — R3129 Other microscopic hematuria: Secondary | ICD-10-CM | POA: Diagnosis not present

## 2019-01-29 DIAGNOSIS — I110 Hypertensive heart disease with heart failure: Secondary | ICD-10-CM | POA: Diagnosis not present

## 2019-01-29 DIAGNOSIS — G243 Spasmodic torticollis: Secondary | ICD-10-CM | POA: Diagnosis not present

## 2019-01-29 DIAGNOSIS — M0689 Other specified rheumatoid arthritis, multiple sites: Secondary | ICD-10-CM | POA: Diagnosis not present

## 2019-01-29 DIAGNOSIS — M6281 Muscle weakness (generalized): Secondary | ICD-10-CM | POA: Diagnosis not present

## 2019-01-29 DIAGNOSIS — Z7901 Long term (current) use of anticoagulants: Secondary | ICD-10-CM

## 2019-01-29 DIAGNOSIS — G20 Parkinson's disease: Secondary | ICD-10-CM | POA: Diagnosis not present

## 2019-01-29 DIAGNOSIS — Z09 Encounter for follow-up examination after completed treatment for conditions other than malignant neoplasm: Secondary | ICD-10-CM

## 2019-01-29 DIAGNOSIS — R269 Unspecified abnormalities of gait and mobility: Secondary | ICD-10-CM | POA: Diagnosis not present

## 2019-01-29 DIAGNOSIS — N390 Urinary tract infection, site not specified: Secondary | ICD-10-CM | POA: Diagnosis not present

## 2019-01-29 DIAGNOSIS — M797 Fibromyalgia: Secondary | ICD-10-CM | POA: Diagnosis not present

## 2019-01-29 DIAGNOSIS — Z20828 Contact with and (suspected) exposure to other viral communicable diseases: Secondary | ICD-10-CM | POA: Diagnosis not present

## 2019-01-29 DIAGNOSIS — Z96653 Presence of artificial knee joint, bilateral: Secondary | ICD-10-CM | POA: Diagnosis not present

## 2019-01-29 DIAGNOSIS — M4692 Unspecified inflammatory spondylopathy, cervical region: Secondary | ICD-10-CM | POA: Diagnosis not present

## 2019-01-29 DIAGNOSIS — F330 Major depressive disorder, recurrent, mild: Secondary | ICD-10-CM | POA: Diagnosis not present

## 2019-01-29 DIAGNOSIS — I10 Essential (primary) hypertension: Secondary | ICD-10-CM

## 2019-01-29 DIAGNOSIS — Z87440 Personal history of urinary (tract) infections: Secondary | ICD-10-CM | POA: Diagnosis not present

## 2019-01-29 DIAGNOSIS — D519 Vitamin B12 deficiency anemia, unspecified: Secondary | ICD-10-CM | POA: Diagnosis not present

## 2019-01-29 DIAGNOSIS — Z86711 Personal history of pulmonary embolism: Secondary | ICD-10-CM | POA: Diagnosis not present

## 2019-01-29 LAB — POCT URINALYSIS DIP (MANUAL ENTRY)
Bilirubin, UA: NEGATIVE
Glucose, UA: NEGATIVE mg/dL
Ketones, POC UA: NEGATIVE mg/dL
Nitrite, UA: NEGATIVE
Protein Ur, POC: 30 mg/dL — AB
Spec Grav, UA: 1.015 (ref 1.010–1.025)
Urobilinogen, UA: 0.2 E.U./dL
pH, UA: 6.5 (ref 5.0–8.0)

## 2019-01-29 LAB — POCT INR: INR: 1.6 — AB (ref 2.0–3.0)

## 2019-01-29 NOTE — Telephone Encounter (Signed)
Verbal orders given over the phone.  

## 2019-01-29 NOTE — Patient Instructions (Addendum)
It was good to see you again today!    I will be in touch with your labs and urine culture asap  Your INR is a bit too low- please adjust your coumadin as follows  Take 4 mg 6 days of the week Take 6 mg (1.5 tablets) one day of the week  Recheck INR in about one week   Take care, let me know if you have any concerns otherwise

## 2019-01-29 NOTE — Telephone Encounter (Signed)
Copied from [place] [id number]. Topic: General - Other >> Jan 29, 2019 12:08 PM Keene Breath wrote: Reason for CRM: Called to request verbal orders for home health PT, 2wk3, 1wk1 starting next week.  Call for any questions at [phone]

## 2019-01-31 ENCOUNTER — Encounter: Payer: Self-pay | Admitting: Family Medicine

## 2019-01-31 LAB — BASIC METABOLIC PANEL
BUN: 9 mg/dL (ref 7–25)
CO2: 26 mmol/L (ref 20–32)
Calcium: 9.6 mg/dL (ref 8.6–10.4)
Chloride: 103 mmol/L (ref 98–110)
Creat: 0.7 mg/dL (ref 0.60–0.93)
Glucose, Bld: 87 mg/dL (ref 65–99)
Potassium: 4 mmol/L (ref 3.5–5.3)
Sodium: 142 mmol/L (ref 135–146)

## 2019-01-31 LAB — URINE CULTURE
MICRO NUMBER:: 1139377
SPECIMEN QUALITY:: ADEQUATE

## 2019-01-31 LAB — CBC
HCT: 40.9 % (ref 35.0–45.0)
Hemoglobin: 13.9 g/dL (ref 11.7–15.5)
MCH: 29 pg (ref 27.0–33.0)
MCHC: 34 g/dL (ref 32.0–36.0)
MCV: 85.4 fL (ref 80.0–100.0)
MPV: 9.6 fL (ref 7.5–12.5)
Platelets: 348 10*3/uL (ref 140–400)
RBC: 4.79 10*6/uL (ref 3.80–5.10)
RDW: 12.3 % (ref 11.0–15.0)
WBC: 7.7 10*3/uL (ref 3.8–10.8)

## 2019-01-31 LAB — URINALYSIS, MICROSCOPIC ONLY
Hyaline Cast: NONE SEEN /LPF
Squamous Epithelial / HPF: NONE SEEN /HPF (ref <=5)
WBC, UA: >=60 /HPF — AB (ref 0–5)

## 2019-01-31 MED ORDER — AMOXICILLIN-POT CLAVULANATE 500-125 MG PO TABS: 1.0000 | ORAL_TABLET | Freq: Three times a day (TID) | ORAL | 0 refills | Status: DC

## 2019-01-31 MED ORDER — AMOXICILLIN-POT CLAVULANATE 500-125 MG PO TABS: 1.0000 | ORAL_TABLET | Freq: Two times a day (BID) | ORAL | 0 refills | Status: DC

## 2019-01-31 NOTE — Addendum Note (Signed)
Addended by: Lamar Blinks C on: 01/31/2019 04:08 PM   Modules accepted: Orders

## 2019-02-04 ENCOUNTER — Encounter: Payer: Self-pay | Admitting: Neurology

## 2019-02-06 ENCOUNTER — Telehealth: Payer: Self-pay | Admitting: Family Medicine

## 2019-02-06 NOTE — Telephone Encounter (Signed)
Called and gave ok  

## 2019-02-06 NOTE — Telephone Encounter (Signed)
[name] [name], from [facility], called stating the pts nursing evaluation has been delayed. Please advise.    [place]

## 2019-02-07 ENCOUNTER — Telehealth: Payer: Self-pay | Admitting: *Deleted

## 2019-02-07 NOTE — Telephone Encounter (Signed)
Left detailed message that it is ok to proceed with OT as requested below.  Please advise if you have any additional recommendation?  Copied from [place] [id number]. Topic: Quick Communication - Home Health Verbal Orders >> Feb 07, 2019 11:50 AM Virl Axe D wrote: Caller/Agency: [facility] Callback Number: [phone] VM Requesting OT/PT/Skilled Nursing/Social Work/Speech Therapy: OT Frequency: 2 week 3/ 1 week 1 Beginning 02/09/19

## 2019-02-07 NOTE — Telephone Encounter (Signed)
Ok thank you 

## 2019-02-12 ENCOUNTER — Telehealth: Payer: Self-pay | Admitting: Family Medicine

## 2019-02-12 NOTE — Telephone Encounter (Signed)
FYI

## 2019-02-12 NOTE — Telephone Encounter (Signed)
[name] [name] from [facility] called stating she did a nurse evaluation on patient today, patient does not need nurse visits., just to continue with the PT.  Call back [phone]

## 2019-02-28 DIAGNOSIS — E039 Hypothyroidism, unspecified: Secondary | ICD-10-CM | POA: Diagnosis not present

## 2019-02-28 DIAGNOSIS — I5032 Chronic diastolic (congestive) heart failure: Secondary | ICD-10-CM | POA: Diagnosis not present

## 2019-02-28 DIAGNOSIS — Z86711 Personal history of pulmonary embolism: Secondary | ICD-10-CM | POA: Diagnosis not present

## 2019-02-28 DIAGNOSIS — Z87440 Personal history of urinary (tract) infections: Secondary | ICD-10-CM | POA: Diagnosis not present

## 2019-02-28 DIAGNOSIS — D519 Vitamin B12 deficiency anemia, unspecified: Secondary | ICD-10-CM | POA: Diagnosis not present

## 2019-02-28 DIAGNOSIS — M797 Fibromyalgia: Secondary | ICD-10-CM | POA: Diagnosis not present

## 2019-02-28 DIAGNOSIS — G243 Spasmodic torticollis: Secondary | ICD-10-CM | POA: Diagnosis not present

## 2019-02-28 DIAGNOSIS — Z20828 Contact with and (suspected) exposure to other viral communicable diseases: Secondary | ICD-10-CM | POA: Diagnosis not present

## 2019-02-28 DIAGNOSIS — F330 Major depressive disorder, recurrent, mild: Secondary | ICD-10-CM | POA: Diagnosis not present

## 2019-02-28 DIAGNOSIS — M0689 Other specified rheumatoid arthritis, multiple sites: Secondary | ICD-10-CM | POA: Diagnosis not present

## 2019-02-28 DIAGNOSIS — R296 Repeated falls: Secondary | ICD-10-CM | POA: Diagnosis not present

## 2019-02-28 DIAGNOSIS — I110 Hypertensive heart disease with heart failure: Secondary | ICD-10-CM | POA: Diagnosis not present

## 2019-02-28 DIAGNOSIS — Z7901 Long term (current) use of anticoagulants: Secondary | ICD-10-CM | POA: Diagnosis not present

## 2019-02-28 DIAGNOSIS — Z96653 Presence of artificial knee joint, bilateral: Secondary | ICD-10-CM | POA: Diagnosis not present

## 2019-02-28 DIAGNOSIS — M6281 Muscle weakness (generalized): Secondary | ICD-10-CM | POA: Diagnosis not present

## 2019-02-28 DIAGNOSIS — G20 Parkinson's disease: Secondary | ICD-10-CM | POA: Diagnosis not present

## 2019-02-28 DIAGNOSIS — M4692 Unspecified inflammatory spondylopathy, cervical region: Secondary | ICD-10-CM | POA: Diagnosis not present

## 2019-03-03 ENCOUNTER — Telehealth: Payer: Self-pay | Admitting: Family Medicine

## 2019-03-03 DIAGNOSIS — I5032 Chronic diastolic (congestive) heart failure: Secondary | ICD-10-CM | POA: Diagnosis not present

## 2019-03-03 DIAGNOSIS — M4692 Unspecified inflammatory spondylopathy, cervical region: Secondary | ICD-10-CM | POA: Diagnosis not present

## 2019-03-03 DIAGNOSIS — N390 Urinary tract infection, site not specified: Secondary | ICD-10-CM

## 2019-03-03 DIAGNOSIS — G20 Parkinson's disease: Secondary | ICD-10-CM | POA: Diagnosis not present

## 2019-03-03 DIAGNOSIS — M0689 Other specified rheumatoid arthritis, multiple sites: Secondary | ICD-10-CM | POA: Diagnosis not present

## 2019-03-03 DIAGNOSIS — G243 Spasmodic torticollis: Secondary | ICD-10-CM | POA: Diagnosis not present

## 2019-03-03 DIAGNOSIS — I110 Hypertensive heart disease with heart failure: Secondary | ICD-10-CM | POA: Diagnosis not present

## 2019-03-03 MED ORDER — AMOXICILLIN-POT CLAVULANATE 500-125 MG PO TABS: 1.0000 | ORAL_TABLET | Freq: Two times a day (BID) | ORAL | 0 refills | Status: DC

## 2019-03-03 NOTE — Telephone Encounter (Signed)
Called her back - LMOM Sent in abx rx However with recent resistant UTI, please collect a sample at home prior to abx start, place in fridge and bring to [name] at clinic tomorrow for culture

## 2019-03-03 NOTE — Telephone Encounter (Signed)
PT Mark with [facility] called in today and did report the patient had a fall on Friday night (02/28/2019). She slipped on hardwood floors getting out of bed, did not have proper shoes/gripper socks on. Patient and PT report no injuries. Advise if any followup needed.

## 2019-03-03 NOTE — Telephone Encounter (Signed)
Patient is calling to request if Dr. Lorelei Pont can call in more antibotics for her. The UTI has returned from 2-3 weeks ago.  Preferred Pharmacy- [facility] [address].

## 2019-03-04 ENCOUNTER — Other Ambulatory Visit: Payer: Medicare Other

## 2019-03-04 ENCOUNTER — Other Ambulatory Visit: Payer: Self-pay

## 2019-03-04 DIAGNOSIS — N390 Urinary tract infection, site not specified: Secondary | ICD-10-CM

## 2019-03-05 ENCOUNTER — Telehealth: Payer: Self-pay | Admitting: Family Medicine

## 2019-03-05 NOTE — Telephone Encounter (Signed)
Copied from [place] [id number]. Topic: General - Other >> Mar 05, 2019  4:24 PM Keene Breath wrote: Reason for CRM: Patient called to ask for her urine test results.  Please call to discuss at [phone]

## 2019-03-05 NOTE — Telephone Encounter (Signed)
Patient had urine culture-will take a couple of days for bacteria to grow if any. Will call patients with results when we have them.

## 2019-03-06 ENCOUNTER — Ambulatory Visit: Payer: Medicare Other | Admitting: Neurology

## 2019-03-06 DIAGNOSIS — Z86718 Personal history of other venous thrombosis and embolism: Secondary | ICD-10-CM | POA: Diagnosis not present

## 2019-03-06 DIAGNOSIS — Z7901 Long term (current) use of anticoagulants: Secondary | ICD-10-CM | POA: Diagnosis not present

## 2019-03-06 LAB — URINE CULTURE
MICRO NUMBER:: 1236547
SPECIMEN QUALITY:: ADEQUATE

## 2019-03-09 ENCOUNTER — Encounter: Payer: Self-pay | Admitting: Family Medicine

## 2019-03-10 DIAGNOSIS — G243 Spasmodic torticollis: Secondary | ICD-10-CM | POA: Diagnosis not present

## 2019-03-10 DIAGNOSIS — I110 Hypertensive heart disease with heart failure: Secondary | ICD-10-CM | POA: Diagnosis not present

## 2019-03-10 DIAGNOSIS — M4692 Unspecified inflammatory spondylopathy, cervical region: Secondary | ICD-10-CM | POA: Diagnosis not present

## 2019-03-10 DIAGNOSIS — I5032 Chronic diastolic (congestive) heart failure: Secondary | ICD-10-CM | POA: Diagnosis not present

## 2019-03-10 DIAGNOSIS — G20 Parkinson's disease: Secondary | ICD-10-CM | POA: Diagnosis not present

## 2019-03-10 DIAGNOSIS — M0689 Other specified rheumatoid arthritis, multiple sites: Secondary | ICD-10-CM | POA: Diagnosis not present

## 2019-03-11 DIAGNOSIS — M4692 Unspecified inflammatory spondylopathy, cervical region: Secondary | ICD-10-CM | POA: Diagnosis not present

## 2019-03-11 DIAGNOSIS — I5032 Chronic diastolic (congestive) heart failure: Secondary | ICD-10-CM | POA: Diagnosis not present

## 2019-03-11 DIAGNOSIS — G20 Parkinson's disease: Secondary | ICD-10-CM | POA: Diagnosis not present

## 2019-03-11 DIAGNOSIS — M0689 Other specified rheumatoid arthritis, multiple sites: Secondary | ICD-10-CM | POA: Diagnosis not present

## 2019-03-11 DIAGNOSIS — I110 Hypertensive heart disease with heart failure: Secondary | ICD-10-CM | POA: Diagnosis not present

## 2019-03-11 DIAGNOSIS — G243 Spasmodic torticollis: Secondary | ICD-10-CM | POA: Diagnosis not present

## 2019-03-12 ENCOUNTER — Telehealth: Payer: Self-pay | Admitting: Family Medicine

## 2019-03-12 DIAGNOSIS — U071 COVID-19: Secondary | ICD-10-CM | POA: Diagnosis not present

## 2019-03-12 DIAGNOSIS — E876 Hypokalemia: Secondary | ICD-10-CM | POA: Diagnosis not present

## 2019-03-12 DIAGNOSIS — Z209 Contact with and (suspected) exposure to unspecified communicable disease: Secondary | ICD-10-CM | POA: Diagnosis not present

## 2019-03-12 DIAGNOSIS — E785 Hyperlipidemia, unspecified: Secondary | ICD-10-CM | POA: Diagnosis not present

## 2019-03-12 DIAGNOSIS — R110 Nausea: Secondary | ICD-10-CM | POA: Diagnosis not present

## 2019-03-12 DIAGNOSIS — R509 Fever, unspecified: Secondary | ICD-10-CM | POA: Diagnosis not present

## 2019-03-12 DIAGNOSIS — M4692 Unspecified inflammatory spondylopathy, cervical region: Secondary | ICD-10-CM | POA: Diagnosis not present

## 2019-03-12 DIAGNOSIS — J1282 Pneumonia due to coronavirus disease 2019: Secondary | ICD-10-CM | POA: Diagnosis not present

## 2019-03-12 DIAGNOSIS — R0602 Shortness of breath: Secondary | ICD-10-CM | POA: Diagnosis not present

## 2019-03-12 DIAGNOSIS — R05 Cough: Secondary | ICD-10-CM | POA: Diagnosis not present

## 2019-03-12 DIAGNOSIS — M0689 Other specified rheumatoid arthritis, multiple sites: Secondary | ICD-10-CM | POA: Diagnosis not present

## 2019-03-12 DIAGNOSIS — I10 Essential (primary) hypertension: Secondary | ICD-10-CM | POA: Diagnosis not present

## 2019-03-12 DIAGNOSIS — G243 Spasmodic torticollis: Secondary | ICD-10-CM | POA: Diagnosis not present

## 2019-03-12 DIAGNOSIS — J029 Acute pharyngitis, unspecified: Secondary | ICD-10-CM | POA: Diagnosis not present

## 2019-03-12 DIAGNOSIS — R112 Nausea with vomiting, unspecified: Secondary | ICD-10-CM | POA: Diagnosis not present

## 2019-03-12 DIAGNOSIS — I110 Hypertensive heart disease with heart failure: Secondary | ICD-10-CM | POA: Diagnosis not present

## 2019-03-12 DIAGNOSIS — R1111 Vomiting without nausea: Secondary | ICD-10-CM | POA: Diagnosis not present

## 2019-03-12 DIAGNOSIS — I5032 Chronic diastolic (congestive) heart failure: Secondary | ICD-10-CM | POA: Diagnosis not present

## 2019-03-12 DIAGNOSIS — G20 Parkinson's disease: Secondary | ICD-10-CM | POA: Diagnosis not present

## 2019-03-12 DIAGNOSIS — N390 Urinary tract infection, site not specified: Secondary | ICD-10-CM | POA: Diagnosis not present

## 2019-03-12 NOTE — Telephone Encounter (Signed)
Verbal orders given  

## 2019-03-12 NOTE — Telephone Encounter (Signed)
Home Health Verbal Orders - Caller/Agency: Ailene Ravel from [facility] Callback Number: [phone], OK to leave a message Requesting:  Nursing  Frequency: evaluation (social worker noticed pt may not be taking medication correctly)

## 2019-03-13 DIAGNOSIS — J1282 Pneumonia due to coronavirus disease 2019: Secondary | ICD-10-CM | POA: Diagnosis present

## 2019-03-13 DIAGNOSIS — T148XXA Other injury of unspecified body region, initial encounter: Secondary | ICD-10-CM | POA: Diagnosis not present

## 2019-03-13 DIAGNOSIS — Z87891 Personal history of nicotine dependence: Secondary | ICD-10-CM | POA: Diagnosis not present

## 2019-03-13 DIAGNOSIS — M6281 Muscle weakness (generalized): Secondary | ICD-10-CM | POA: Diagnosis not present

## 2019-03-13 DIAGNOSIS — R8281 Pyuria: Secondary | ICD-10-CM | POA: Diagnosis not present

## 2019-03-13 DIAGNOSIS — Z8619 Personal history of other infectious and parasitic diseases: Secondary | ICD-10-CM | POA: Diagnosis not present

## 2019-03-13 DIAGNOSIS — Z7982 Long term (current) use of aspirin: Secondary | ICD-10-CM | POA: Diagnosis not present

## 2019-03-13 DIAGNOSIS — F329 Major depressive disorder, single episode, unspecified: Secondary | ICD-10-CM | POA: Diagnosis not present

## 2019-03-13 DIAGNOSIS — Z743 Need for continuous supervision: Secondary | ICD-10-CM | POA: Diagnosis not present

## 2019-03-13 DIAGNOSIS — E876 Hypokalemia: Secondary | ICD-10-CM | POA: Diagnosis present

## 2019-03-13 DIAGNOSIS — E785 Hyperlipidemia, unspecified: Secondary | ICD-10-CM | POA: Diagnosis present

## 2019-03-13 DIAGNOSIS — R2681 Unsteadiness on feet: Secondary | ICD-10-CM | POA: Diagnosis not present

## 2019-03-13 DIAGNOSIS — F0280 Dementia in other diseases classified elsewhere without behavioral disturbance: Secondary | ICD-10-CM | POA: Diagnosis present

## 2019-03-13 DIAGNOSIS — Z87440 Personal history of urinary (tract) infections: Secondary | ICD-10-CM | POA: Diagnosis not present

## 2019-03-13 DIAGNOSIS — Z9049 Acquired absence of other specified parts of digestive tract: Secondary | ICD-10-CM | POA: Diagnosis not present

## 2019-03-13 DIAGNOSIS — R4182 Altered mental status, unspecified: Secondary | ICD-10-CM | POA: Diagnosis not present

## 2019-03-13 DIAGNOSIS — F0390 Unspecified dementia without behavioral disturbance: Secondary | ICD-10-CM | POA: Diagnosis not present

## 2019-03-13 DIAGNOSIS — D688 Other specified coagulation defects: Secondary | ICD-10-CM | POA: Diagnosis not present

## 2019-03-13 DIAGNOSIS — E039 Hypothyroidism, unspecified: Secondary | ICD-10-CM | POA: Diagnosis not present

## 2019-03-13 DIAGNOSIS — I10 Essential (primary) hypertension: Secondary | ICD-10-CM | POA: Diagnosis not present

## 2019-03-13 DIAGNOSIS — E46 Unspecified protein-calorie malnutrition: Secondary | ICD-10-CM | POA: Diagnosis not present

## 2019-03-13 DIAGNOSIS — M069 Rheumatoid arthritis, unspecified: Secondary | ICD-10-CM | POA: Diagnosis present

## 2019-03-13 DIAGNOSIS — G20 Parkinson's disease: Secondary | ICD-10-CM | POA: Diagnosis not present

## 2019-03-13 DIAGNOSIS — Z7901 Long term (current) use of anticoagulants: Secondary | ICD-10-CM | POA: Diagnosis not present

## 2019-03-13 DIAGNOSIS — Z96659 Presence of unspecified artificial knee joint: Secondary | ICD-10-CM | POA: Diagnosis present

## 2019-03-13 DIAGNOSIS — R531 Weakness: Secondary | ICD-10-CM | POA: Diagnosis not present

## 2019-03-13 DIAGNOSIS — U071 COVID-19: Secondary | ICD-10-CM | POA: Diagnosis not present

## 2019-03-13 DIAGNOSIS — F3289 Other specified depressive episodes: Secondary | ICD-10-CM | POA: Diagnosis not present

## 2019-03-13 DIAGNOSIS — R627 Adult failure to thrive: Secondary | ICD-10-CM | POA: Diagnosis not present

## 2019-03-13 DIAGNOSIS — R279 Unspecified lack of coordination: Secondary | ICD-10-CM | POA: Diagnosis not present

## 2019-03-13 DIAGNOSIS — Z90710 Acquired absence of both cervix and uterus: Secondary | ICD-10-CM | POA: Diagnosis not present

## 2019-03-13 DIAGNOSIS — Z1612 Extended spectrum beta lactamase (ESBL) resistance: Secondary | ICD-10-CM | POA: Diagnosis not present

## 2019-03-13 DIAGNOSIS — Z66 Do not resuscitate: Secondary | ICD-10-CM | POA: Diagnosis present

## 2019-03-13 DIAGNOSIS — Z86711 Personal history of pulmonary embolism: Secondary | ICD-10-CM | POA: Diagnosis not present

## 2019-03-14 ENCOUNTER — Ambulatory Visit: Payer: Medicare Other | Admitting: Neurology

## 2019-03-17 DIAGNOSIS — Z743 Need for continuous supervision: Secondary | ICD-10-CM | POA: Diagnosis not present

## 2019-03-17 DIAGNOSIS — N390 Urinary tract infection, site not specified: Secondary | ICD-10-CM | POA: Diagnosis not present

## 2019-03-17 DIAGNOSIS — R4182 Altered mental status, unspecified: Secondary | ICD-10-CM | POA: Diagnosis not present

## 2019-03-17 DIAGNOSIS — R531 Weakness: Secondary | ICD-10-CM | POA: Diagnosis not present

## 2019-03-17 DIAGNOSIS — T148XXA Other injury of unspecified body region, initial encounter: Secondary | ICD-10-CM | POA: Diagnosis not present

## 2019-03-17 DIAGNOSIS — F3289 Other specified depressive episodes: Secondary | ICD-10-CM | POA: Diagnosis not present

## 2019-03-17 DIAGNOSIS — M6281 Muscle weakness (generalized): Secondary | ICD-10-CM | POA: Diagnosis not present

## 2019-03-17 DIAGNOSIS — I10 Essential (primary) hypertension: Secondary | ICD-10-CM | POA: Diagnosis not present

## 2019-03-17 DIAGNOSIS — R279 Unspecified lack of coordination: Secondary | ICD-10-CM | POA: Diagnosis not present

## 2019-03-17 DIAGNOSIS — E039 Hypothyroidism, unspecified: Secondary | ICD-10-CM | POA: Diagnosis not present

## 2019-03-17 DIAGNOSIS — E46 Unspecified protein-calorie malnutrition: Secondary | ICD-10-CM | POA: Diagnosis not present

## 2019-03-17 DIAGNOSIS — U071 COVID-19: Secondary | ICD-10-CM | POA: Diagnosis not present

## 2019-03-17 DIAGNOSIS — G20 Parkinson's disease: Secondary | ICD-10-CM | POA: Diagnosis not present

## 2019-03-17 DIAGNOSIS — R627 Adult failure to thrive: Secondary | ICD-10-CM | POA: Diagnosis not present

## 2019-03-17 DIAGNOSIS — Z7901 Long term (current) use of anticoagulants: Secondary | ICD-10-CM | POA: Diagnosis not present

## 2019-03-17 DIAGNOSIS — R2681 Unsteadiness on feet: Secondary | ICD-10-CM | POA: Diagnosis not present

## 2019-03-17 DIAGNOSIS — I2782 Chronic pulmonary embolism: Secondary | ICD-10-CM | POA: Diagnosis not present

## 2019-03-17 DIAGNOSIS — F0390 Unspecified dementia without behavioral disturbance: Secondary | ICD-10-CM | POA: Diagnosis not present

## 2019-03-17 MED ORDER — AMLODIPINE BESYLATE 2.5 MG PO TABS
2.50 | ORAL_TABLET | ORAL | Status: DC
Start: 2019-03-18 — End: 2019-03-17

## 2019-03-17 MED ORDER — BENZONATATE 100 MG PO CAPS
100.00 | ORAL_CAPSULE | ORAL | Status: DC
Start: ? — End: 2019-03-17

## 2019-03-17 MED ORDER — SODIUM CHLORIDE 0.9 % IV SOLN
INTRAVENOUS | Status: DC
Start: ? — End: 2019-03-17

## 2019-03-17 MED ORDER — ONDANSETRON HCL 4 MG/2ML IJ SOLN
4.00 | INTRAMUSCULAR | Status: DC
Start: ? — End: 2019-03-17

## 2019-03-17 MED ORDER — ACETAMINOPHEN 325 MG PO TABS
650.00 | ORAL_TABLET | ORAL | Status: DC
Start: ? — End: 2019-03-17

## 2019-03-17 MED ORDER — VENLAFAXINE HCL ER 150 MG PO CP24
150.00 | ORAL_CAPSULE | ORAL | Status: DC
Start: 2019-03-18 — End: 2019-03-17

## 2019-03-17 MED ORDER — MAGNESIUM OXIDE 400 MG PO TABS
400.00 | ORAL_TABLET | ORAL | Status: DC
Start: 2019-03-18 — End: 2019-03-17

## 2019-03-17 MED ORDER — CARBIDOPA-LEVODOPA 25-100 MG PO TABS
2.00 | ORAL_TABLET | ORAL | Status: DC
Start: 2019-03-17 — End: 2019-03-17

## 2019-03-17 MED ORDER — ATORVASTATIN CALCIUM 10 MG PO TABS
10.00 | ORAL_TABLET | ORAL | Status: DC
Start: 2019-03-17 — End: 2019-03-17

## 2019-03-17 MED ORDER — WARFARIN SODIUM 5 MG PO TABS
5.00 | ORAL_TABLET | ORAL | Status: DC
Start: 2019-03-17 — End: 2019-03-17

## 2019-03-17 MED ORDER — LEVOTHYROXINE SODIUM 125 MCG PO TABS
125.00 | ORAL_TABLET | ORAL | Status: DC
Start: 2019-03-18 — End: 2019-03-17

## 2019-03-17 MED ORDER — ASPIRIN 81 MG PO TBEC
81.00 | DELAYED_RELEASE_TABLET | ORAL | Status: DC
Start: 2019-03-18 — End: 2019-03-17

## 2019-03-19 ENCOUNTER — Encounter: Payer: Self-pay | Admitting: *Deleted

## 2019-03-19 DIAGNOSIS — U071 COVID-19: Secondary | ICD-10-CM | POA: Diagnosis not present

## 2019-03-19 DIAGNOSIS — G20 Parkinson's disease: Secondary | ICD-10-CM | POA: Diagnosis not present

## 2019-03-19 DIAGNOSIS — I2782 Chronic pulmonary embolism: Secondary | ICD-10-CM | POA: Diagnosis not present

## 2019-03-19 DIAGNOSIS — I10 Essential (primary) hypertension: Secondary | ICD-10-CM | POA: Diagnosis not present

## 2019-03-19 NOTE — Progress Notes (Addendum)
Abbe[place]ail, [place] 03/19/2019 BV-[id number] Benefit Verification 11/29/2018 02/21/2019 [facility] [place] Gu[place] Tat In Progress [employer]  [employer] replied For Medicare Part B (note this is in a table format and did not carry over to this document well. Look at scanned document in Media for more clarity. PATIENT COVERAGE - MAJOR MEDICAL BENEFITS ICD Code(s) : G24.3   # of BOTOX Units: 300 BV Result PA/Pre-D Required Copay Coinsurance [id number] Covered - Per Insurer Guidelines  No $0.00 20% BOTOX (R1071) Buy and Bill Covered - Per Insurer Guidelines  No $0.00 20% BOTOX (G5247) Specialty Pharmacy Not Covered - Insurer Guidelines Not Met  Not Eligible  For AARP supplemental ins the following was verified: The medical plan's renewal date is 03/06/2020. This plan runs on a calendar year basis. The local Medicare Administrative Contractor's published Local Coverage Article [place] [phone], lists the diagnosis G24.3, CPT code [phone] and HCPCS code [phone].

## 2019-03-21 DIAGNOSIS — I2782 Chronic pulmonary embolism: Secondary | ICD-10-CM | POA: Diagnosis not present

## 2019-03-21 DIAGNOSIS — I10 Essential (primary) hypertension: Secondary | ICD-10-CM | POA: Diagnosis not present

## 2019-03-21 DIAGNOSIS — G20 Parkinson's disease: Secondary | ICD-10-CM | POA: Diagnosis not present

## 2019-03-21 DIAGNOSIS — U071 COVID-19: Secondary | ICD-10-CM | POA: Diagnosis not present

## 2019-03-27 DIAGNOSIS — Z7901 Long term (current) use of anticoagulants: Secondary | ICD-10-CM | POA: Diagnosis not present

## 2019-03-27 IMAGING — DX DG CHEST 1V PORT
1 series · 1 of 1 positions shown · non-contrast
Comparison: Chest x-ray 04/29/2016.

CLINICAL DATA: 71-year-old female with history of fever and altered
level of consciousness.

EXAM:
PORTABLE CHEST 1 VIEW

[chest ap]
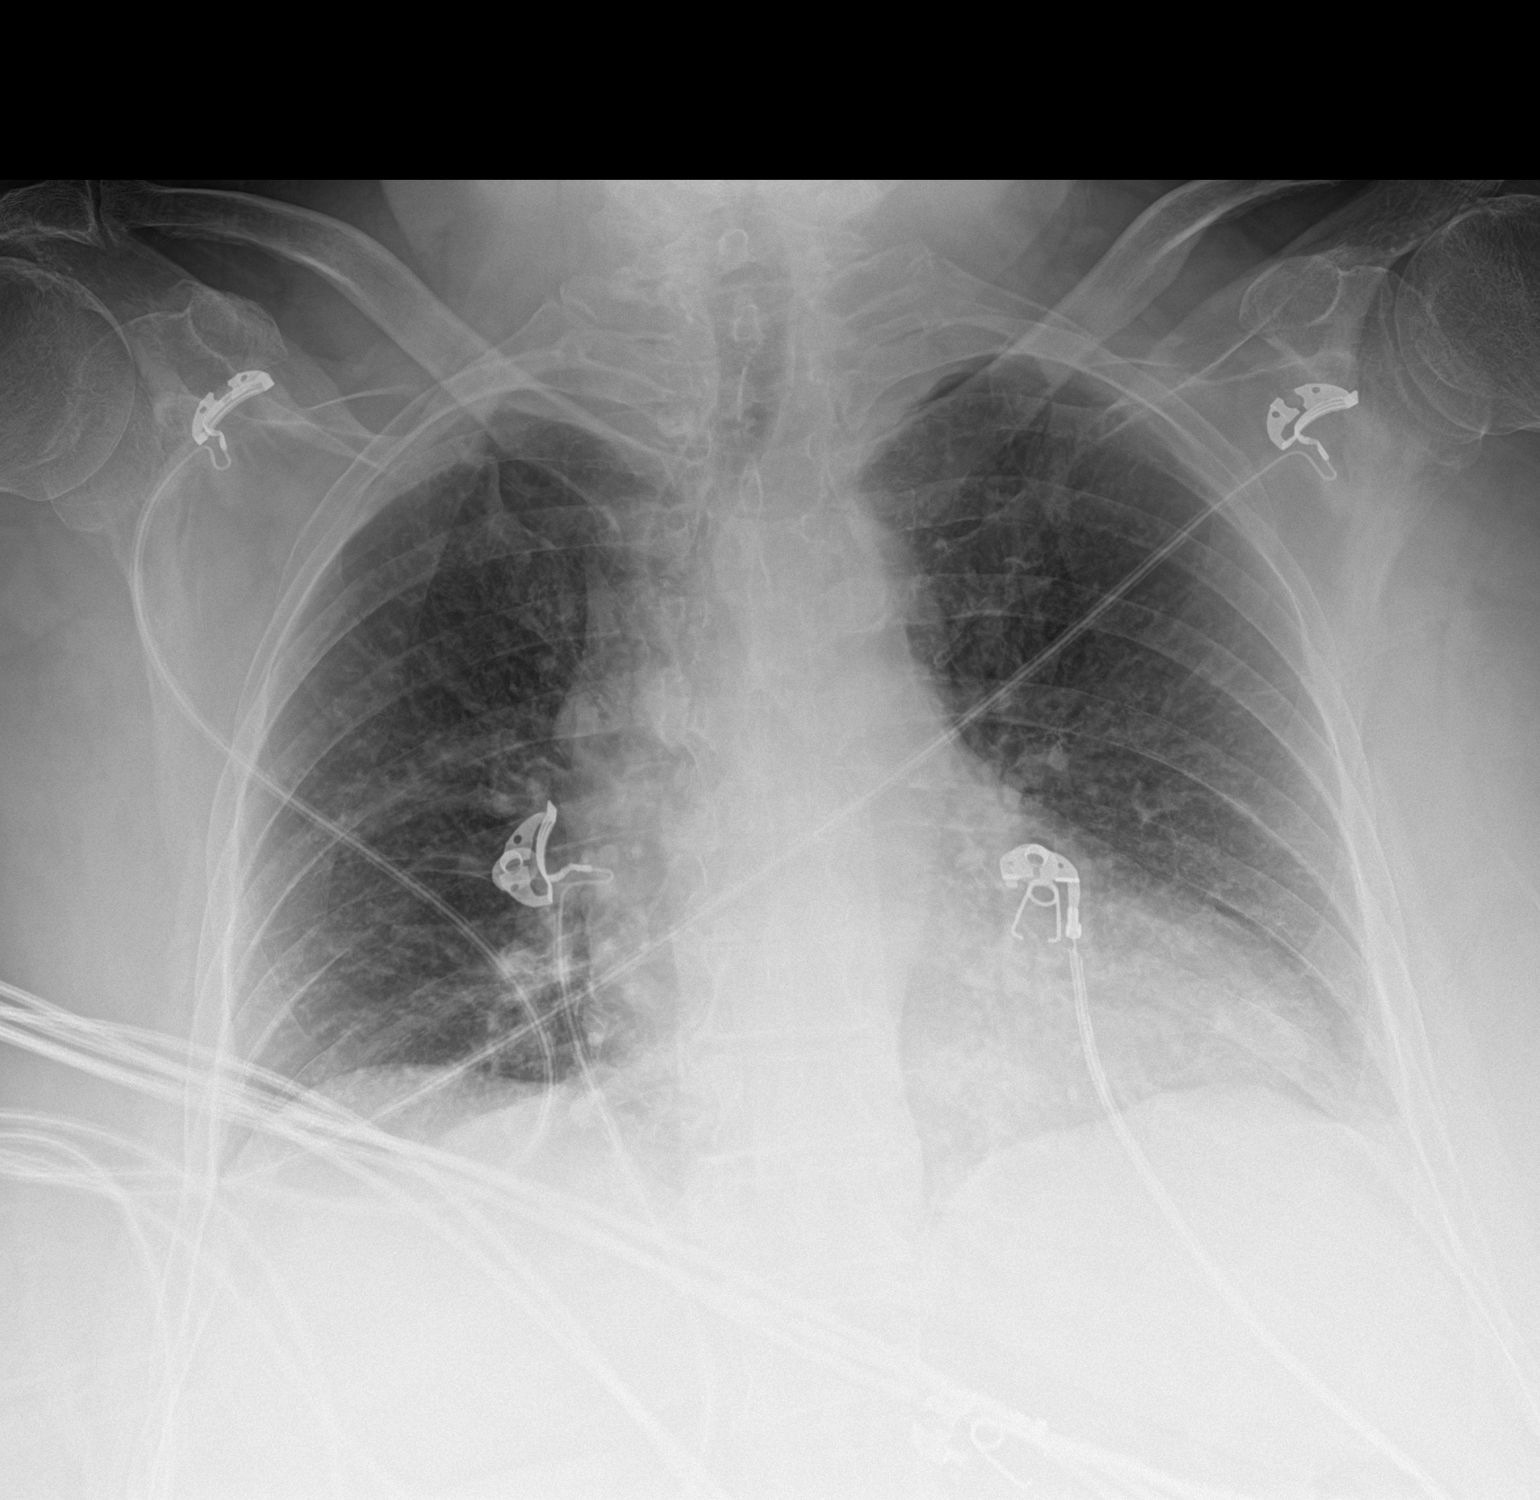

[1 of 1 positions shown; findings below may reference images not displayed]

FINDINGS: Lung volumes are low. Diffuse peribronchial cuffing and widespread
interstitial prominence. No confluent consolidative airspace
disease. No pleural effusions. No cephalization of the pulmonary
vasculature. Heart size is upper limits of normal. The patient is
rotated to the left on today's exam, resulting in distortion of the
mediastinal contours and reduced diagnostic sensitivity and
specificity for mediastinal pathology. Atherosclerosis in the
thoracic aorta.
IMPRESSION: 1. Diffuse peribronchial cuffing, concerning for an acute
bronchitis.
2. Aortic atherosclerosis.

## 2019-03-27 IMAGING — CT CT HEAD W/O CM
3 of 4 series · 13 of 47 positions shown, 15 images · non-contrast
Comparison: 04/29/2016 head CT.

CLINICAL DATA: Fall.  Confusion.

EXAM:
CT HEAD WITHOUT CONTRAST
TECHNIQUE: Contiguous axial images were obtained from the base of the skull
through the vertex without intravenous contrast.

[Series 3: head without · axial · non-contrast · 0.48mm/px · z∈[-140,-20]mm · 7 of 33 slices shown, 9 images]
[im 5/33  brain]
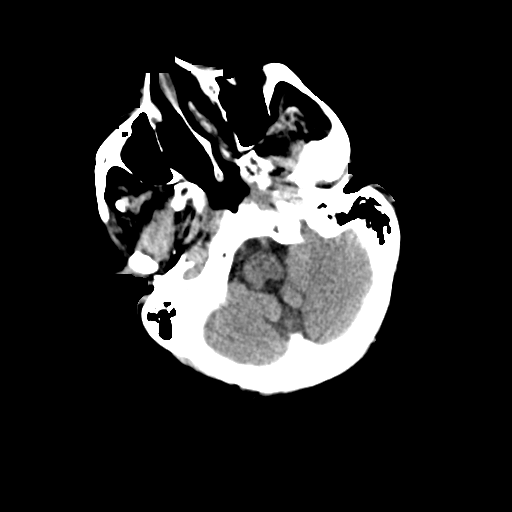
[im 5/33  bone]
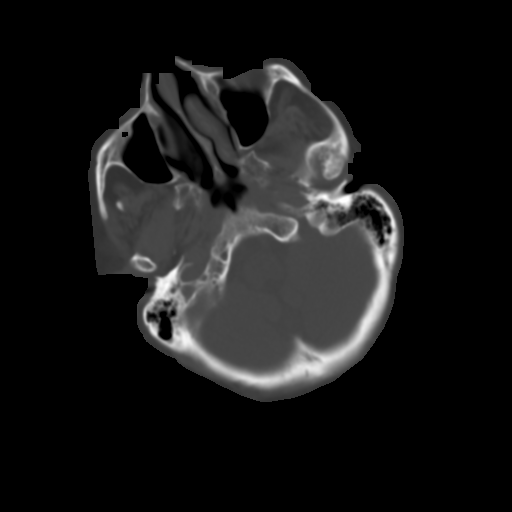
[im 9/33  brain]
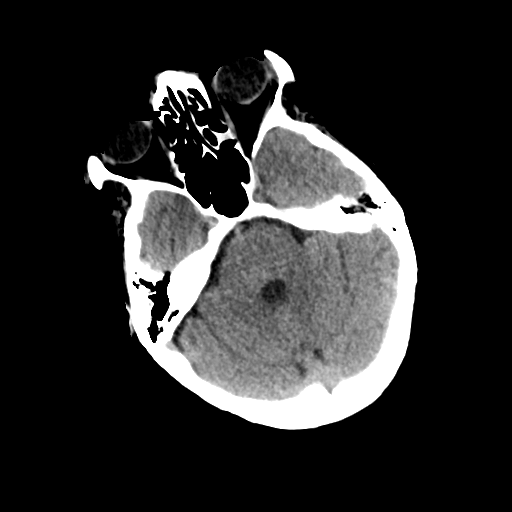
[im 13/33  brain]
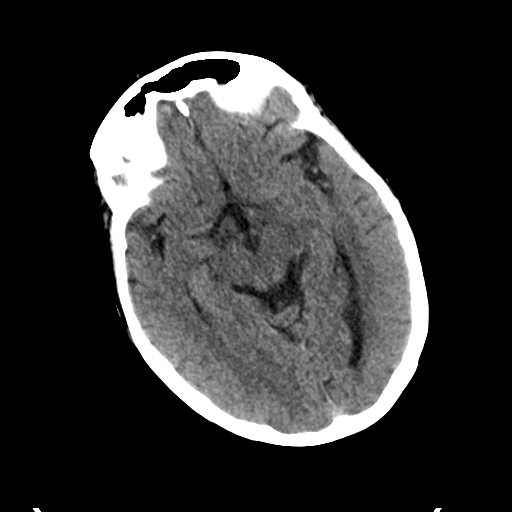
[im 17/33  brain]
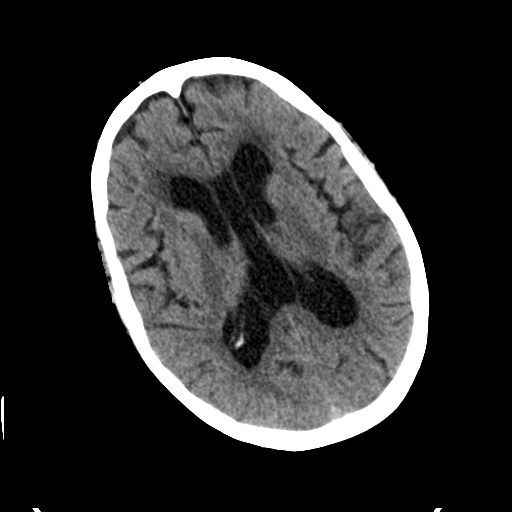
[im 21/33  brain]
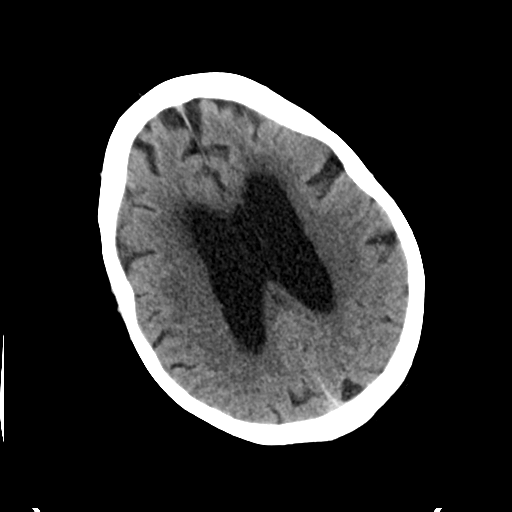
[im 21/33  bone]
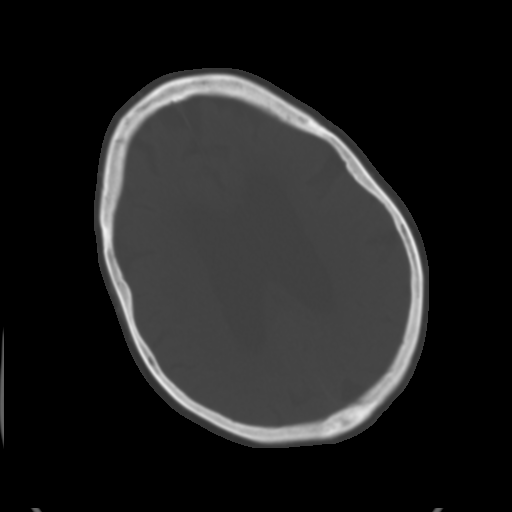
[im 25/33  brain]
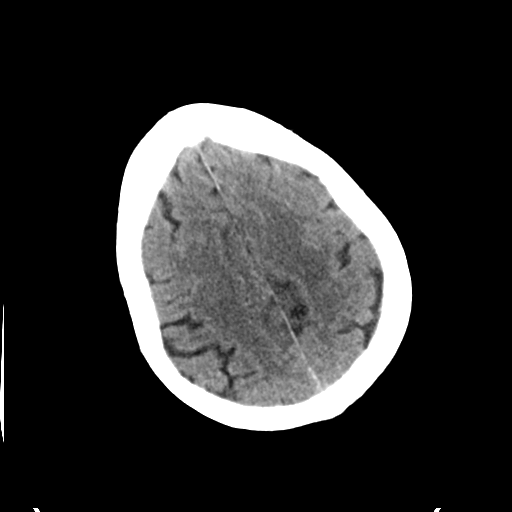
[im 29/33  brain]
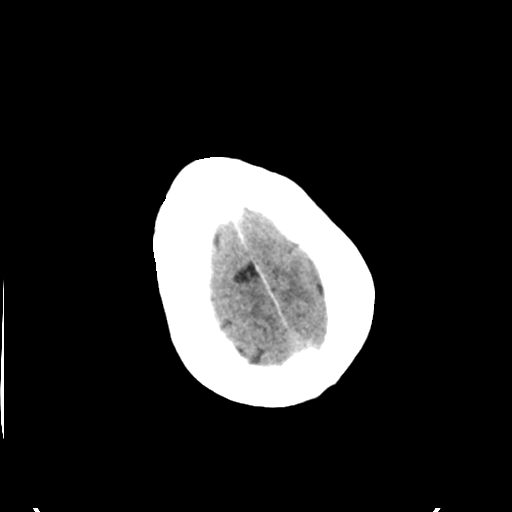

[Series 5: head without cor · coronal · non-contrast · 0.32mm/px · 3 of 70 slices shown]
[im 24/70  brain]
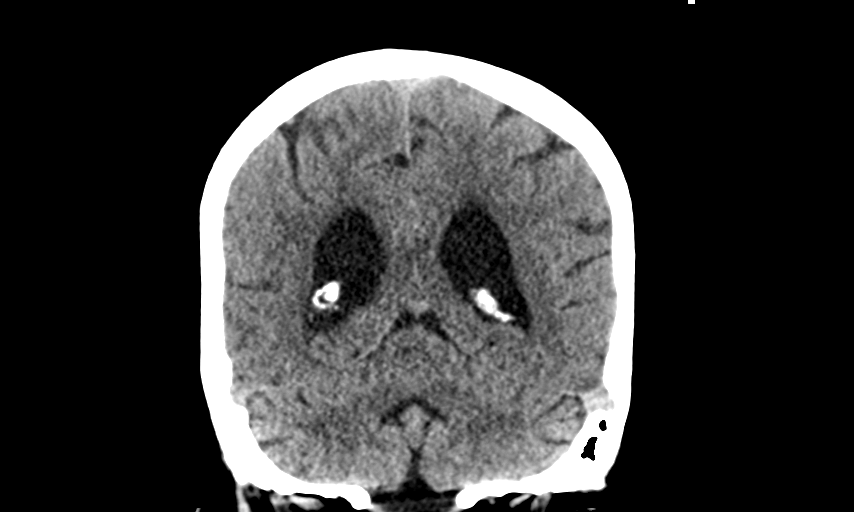
[im 31/70  brain]
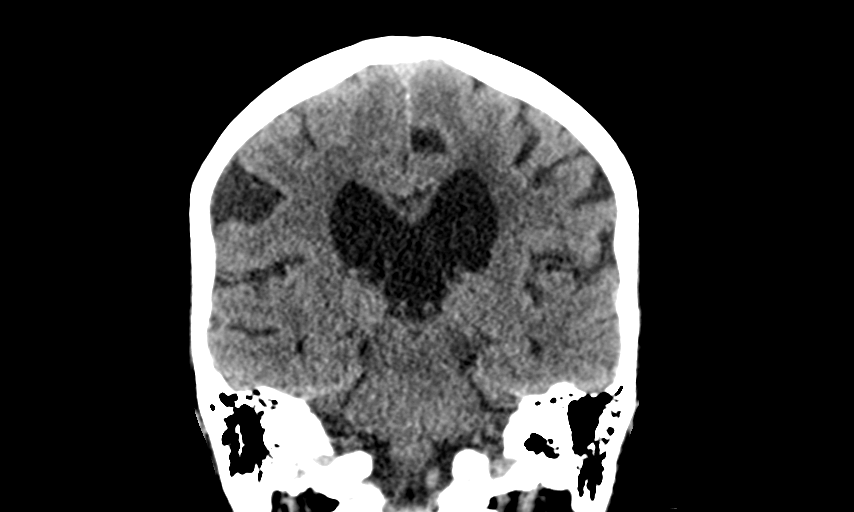
[im 39/70  brain]
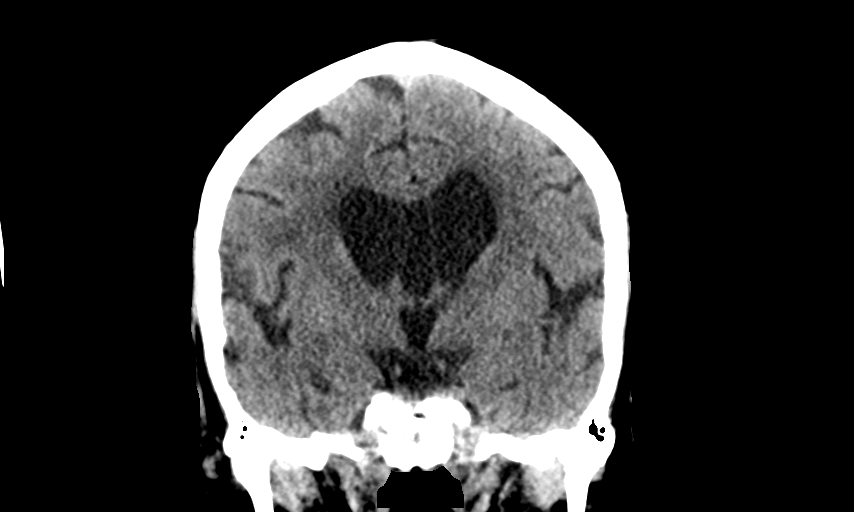

[Series 6: head without sag · sagittal · non-contrast · 0.32mm/px · 3 of 55 slices shown]
[im 19/55  brain]
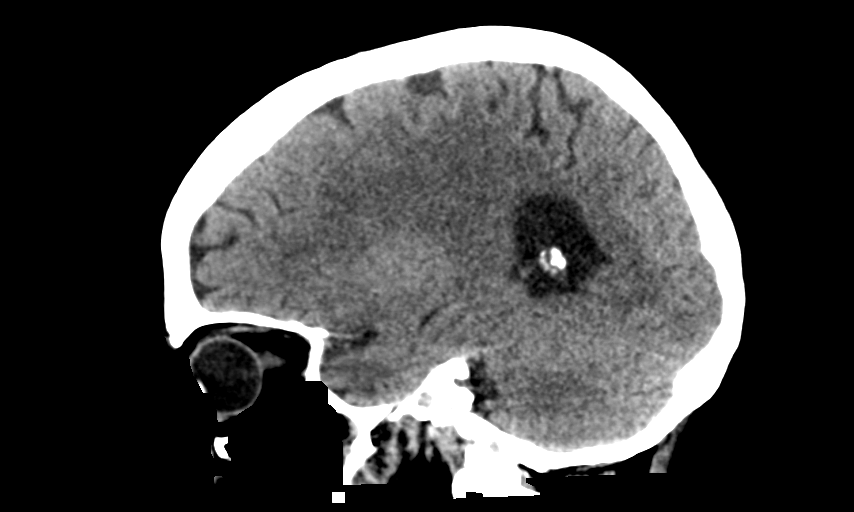
[im 28/55  brain]
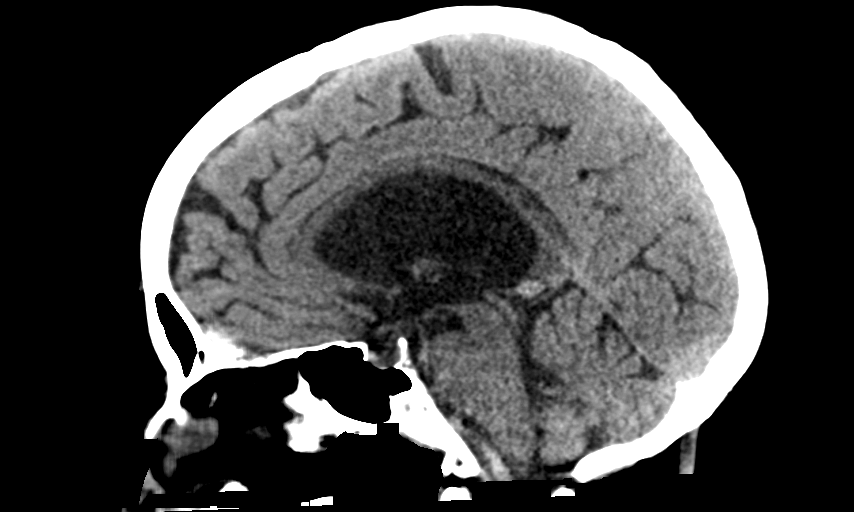
[im 37/55  brain]
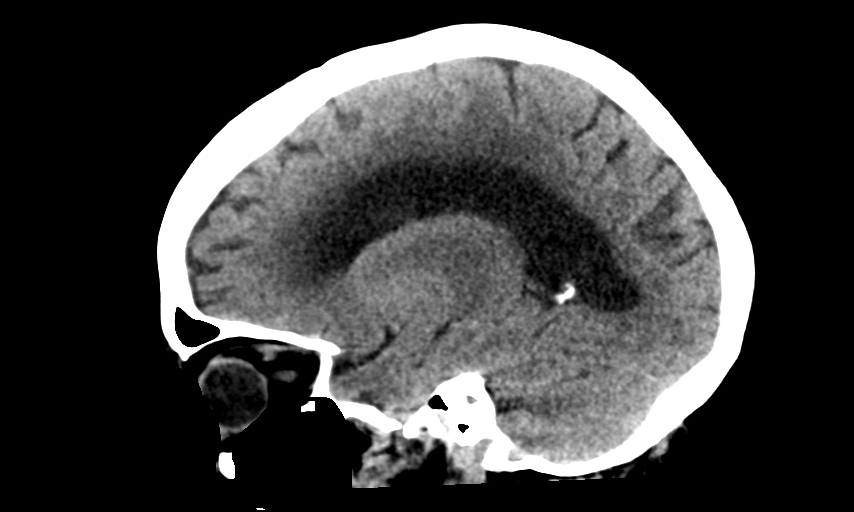

[13 of 47 positions shown; findings below may reference images not displayed]

FINDINGS: Brain: No evidence of parenchymal hemorrhage or extra-axial fluid
collection. No mass lesion, mass effect, or midline shift. No CT
evidence of acute infarction. Nonspecific moderate subcortical and
periventricular white matter hypodensity, most in keeping with
chronic small vessel ischemic change. Generalized cerebral volume
loss. Cavum septum pellucidum et vergae. No ventriculomegaly.

Vascular: No acute abnormality.

Skull: No evidence of calvarial fracture.

Sinuses/Orbits: No fluid levels. Mild mucoperiosteal thickening and
partial opacification of the ethmoidal air cells.

Other:  The mastoid air cells are unopacified.
IMPRESSION: 1. No evidence of acute intracranial abnormality. No evidence of
calvarial fracture.
2. Generalized cerebral volume loss and moderate chronic small
vessel ischemic change .

## 2019-03-28 ENCOUNTER — Ambulatory Visit: Payer: Medicare Other | Admitting: Neurology

## 2019-04-07 DIAGNOSIS — N390 Urinary tract infection, site not specified: Secondary | ICD-10-CM | POA: Diagnosis not present

## 2019-04-16 DIAGNOSIS — D649 Anemia, unspecified: Secondary | ICD-10-CM | POA: Diagnosis not present

## 2019-04-16 DIAGNOSIS — E7849 Other hyperlipidemia: Secondary | ICD-10-CM | POA: Diagnosis not present

## 2019-04-16 DIAGNOSIS — I2699 Other pulmonary embolism without acute cor pulmonale: Secondary | ICD-10-CM | POA: Diagnosis not present

## 2019-04-16 DIAGNOSIS — E038 Other specified hypothyroidism: Secondary | ICD-10-CM | POA: Diagnosis not present

## 2019-04-16 DIAGNOSIS — F320 Major depressive disorder, single episode, mild: Secondary | ICD-10-CM | POA: Diagnosis not present

## 2019-04-16 DIAGNOSIS — Z7901 Long term (current) use of anticoagulants: Secondary | ICD-10-CM | POA: Diagnosis not present

## 2019-04-16 DIAGNOSIS — R4189 Other symptoms and signs involving cognitive functions and awareness: Secondary | ICD-10-CM | POA: Diagnosis not present

## 2019-04-16 DIAGNOSIS — I10 Essential (primary) hypertension: Secondary | ICD-10-CM | POA: Diagnosis not present

## 2019-04-16 DIAGNOSIS — G20 Parkinson's disease: Secondary | ICD-10-CM | POA: Diagnosis not present

## 2019-04-17 DIAGNOSIS — Z139 Encounter for screening, unspecified: Secondary | ICD-10-CM | POA: Diagnosis not present

## 2019-04-17 DIAGNOSIS — I10 Essential (primary) hypertension: Secondary | ICD-10-CM | POA: Diagnosis not present

## 2019-04-17 DIAGNOSIS — D649 Anemia, unspecified: Secondary | ICD-10-CM | POA: Diagnosis not present

## 2019-04-17 DIAGNOSIS — Z7901 Long term (current) use of anticoagulants: Secondary | ICD-10-CM | POA: Diagnosis not present

## 2019-04-17 DIAGNOSIS — E039 Hypothyroidism, unspecified: Secondary | ICD-10-CM | POA: Diagnosis not present

## 2019-04-18 ENCOUNTER — Ambulatory Visit: Payer: Medicare Other | Admitting: Neurology

## 2019-04-22 ENCOUNTER — Other Ambulatory Visit: Payer: Self-pay | Admitting: Family Medicine

## 2019-04-22 DIAGNOSIS — E782 Mixed hyperlipidemia: Secondary | ICD-10-CM

## 2019-04-22 DIAGNOSIS — I10 Essential (primary) hypertension: Secondary | ICD-10-CM

## 2019-04-22 DIAGNOSIS — Z7901 Long term (current) use of anticoagulants: Secondary | ICD-10-CM | POA: Diagnosis not present

## 2019-04-24 DIAGNOSIS — Z86711 Personal history of pulmonary embolism: Secondary | ICD-10-CM | POA: Diagnosis not present

## 2019-04-24 DIAGNOSIS — E785 Hyperlipidemia, unspecified: Secondary | ICD-10-CM | POA: Diagnosis not present

## 2019-04-25 ENCOUNTER — Ambulatory Visit: Payer: Medicare Other | Admitting: Neurology

## 2019-04-29 DIAGNOSIS — Z86711 Personal history of pulmonary embolism: Secondary | ICD-10-CM | POA: Diagnosis not present

## 2019-05-02 DIAGNOSIS — Z86711 Personal history of pulmonary embolism: Secondary | ICD-10-CM | POA: Diagnosis not present

## 2019-05-04 DIAGNOSIS — Z23 Encounter for immunization: Secondary | ICD-10-CM | POA: Diagnosis not present

## 2019-05-05 NOTE — Progress Notes (Signed)
[name] [name] was seen today in follow up for parkinsonism. Son accompanies the patient and supplements the history.   Patient actually has not been seen since 2019 (with the exception of for Botox).  She has no showed and canceled multiple appointments.  My previous records were reviewed prior to todays visit as well as outside records available to me.  She has had multiple hospitalizations since last visit and an extensive number of records were reviewed.  She was in the hospital in January, 2020 due to multiple falls.  She was in the hospital in November for complicated urinary tract infection.  She is back in the hospital in January, 2021 due to increasing weakness and failure to thrive.  Her INR was subtherapeutic on admission.  Chest x-ray on admission showed multifocal infiltrates.  Patient was discharged to subacute nursing facility.  Her daughter emailed me this morning stating that she cannot be here and brought up some concerns about the patient's memory.  Patient has moved the patient to an assisted living, as they were concerned that the patient was not taking her medications properly and not eating.  Son brings up today that social worker actually got involved with that move because of the conditions in which she was living.  Family feels that she has some confusion in the evening.    Pt states today that meals are given to her, meds are distributed to her, someone helps her dress.  She manages the remainder of the day on her own.  No one assists with the bathroom.  She admits to walking within the home.  Pt denies memory loss but she admits that family tells her that memory is not good.  She cannot state what things specifically she has trouble with.  She admits to hallucinations.  She sees a dog.  She knows it isn't real.  It doesn't scare her.  It has been present x 6 months.  No other hallucinations.  Happens daily.   Current prescribed movement disorder medications: Carbidopa/levodopa  25/100, 2 tablets in the morning, 1 in the afternoon and 1 in the evening was last RX by me (patient states that she is on 3 po tid but last d/c summary from hospital in Jan says 2 po tid - pt unaware of when it was increased from when I prescribed it or why) -  We called facility x 2 for updated med list while she was here but wasn't received.    ALLERGIES:   Allergies  Allergen Reactions  . Crestor [Rosuvastatin Calcium] Other (See Comments)    Feeling very bad, aching all over.  . Erythromycin Hives  . Gabapentin Other (See Comments)    Blurred vision  . Pregabalin Other (See Comments)    Crossed vision.  . Pregabalin Other (See Comments)    Crossed vision. Unknown   . Macrobid [Nitrofurantoin] Hives  . Losartan Itching    CURRENT MEDICATIONS:  Outpatient Encounter Medications as of 05/06/2019  Medication Sig  . acetaminophen (TYLENOL) 500 MG tablet Take 500 mg by mouth every 6 (six) hours as needed for mild pain.  Marland Kitchen amLODipine (NORVASC) 2.5 MG tablet TAKE 1 TABLET BY MOUTH ONCE DAILY  . atorvastatin (LIPITOR) 10 MG tablet TAKE 1 TABLET BY MOUTH ONCE DAILY  . carbidopa-levodopa (SINEMET IR) 25-100 MG tablet Take 1 tablet by mouth See admin instructions. Take 2 tablets by mouth in the morning, then take 1 tablet by mouth in the afternoon, 1 tablet by mouth in  the evening  . cephALEXin (KEFLEX) 250 MG capsule Take 1 capsule (250 mg total) by mouth daily.  . Cholecalciferol (VITAMIN D3) 5000 UNITS CAPS Take 5,000 Units by mouth daily.   . cyanocobalamin 500 MCG tablet Take 500 mcg by mouth daily.  . ertapenem (INVANZ) IVPB Inject 1 g into the vein daily. Indication:  ESBL UTI Last Day of Therapy:  01/10/19 Labs - Once weekly:  CBC/D and BMP, Labs - Every other week:  ESR and CRP  . levothyroxine (SYNTHROID) 100 MCG tablet Take 1 tablet (100 mcg total) by mouth daily.  . magnesium oxide (MAG-OX) 400 MG tablet Take 0.5 tablets (200 mg total) by mouth daily.  . Melatonin 5 MG TABS  Take 1 tablet by mouth at bedtime as needed (for sleep).  . Menthol, Topical Analgesic, (BIOFREEZE EX) Apply 1 application topically daily as needed (for pain).  Marland Kitchen venlafaxine XR (EFFEXOR-XR) 150 MG 24 hr capsule Take 1 capsule (150 mg total) by mouth daily with breakfast.  . warfarin (COUMADIN) 4 MG tablet TAKE AS DIRECTED BY COUMADIN CLINIC (Patient taking differently: Take 4 mg by mouth daily. )  . [DISCONTINUED] amoxicillin-clavulanate (AUGMENTIN) 500-125 MG tablet Take 1 tablet (500 mg total) by mouth 2 (two) times daily. (Patient not taking: Reported on 05/06/2019)   No facility-administered encounter medications on file as of 05/06/2019.    PHYSICAL EXAMINATION:    VITALS:   Vitals:   05/06/19 1110  BP: (!) 142/84  Pulse: (!) 105  SpO2: 95%  Weight: 181 lb (82.1 kg)  Height: 5' 6.5" (1.689 m)    GEN:  The patient appears stated age and is in NAD. HEENT:  Normocephalic, atraumatic.  The mucous membranes are moist. The superficial temporal arteries are without ropiness or tenderness. CV:  Tachy.  Regular.   Lungs:  CTAB Neck/HEME:  There are no carotid bruits bilaterally.  Neurological examination:  Orientation: The patient is alert and oriented x3. Cranial nerves: There is good facial symmetry with mild facial hypomimia. The speech is fluent and clear. Soft palate rises symmetrically and there is no tongue deviation. Hearing is intact to conversational tone. Sensation: Sensation is intact to light touch throughout Motor: Strength is at least antigravity x4.  Movement examination: Tone: There is normal tone in the UE/LE Abnormal movements: none Coordination:  There is no decremation with RAM's, but she is slow with all rapid alternating movements, especially on the left.  She has more apraxic on the left. Gait and Station: Not tested, as I asked the patient to stay seated.   ASSESSMENT/PLAN:  1.  Parkinsonism             -It is somewhat unclear the dose of levodopa, but I  am going to reduce it back from what appears to be 2 tablets 3 times per day to which she was previously on when I saw her, which was carbidopa/levodopa 25/100, 2 tablets in the morning, 1 in the afternoon and 1 in evening.  This is primarily because of hallucinations, and because of the fact that I asked her not to walk any longer (we have discussed this previously).  -Called assisted-living several times for updated list of medications.  We did not receive it.  I will continue to try to get a copy.  -Discussed the importance of compliance with follow-up visits.  Until today, I actually had not seen her since 2019 because of multiple no-shows and cancellations.  Discussed that for her overall health and wellness, I  really need to see her on a fairly regular basis, even if that means every 5 or 6 months.  -discussed that she needs to stay seated at all times (Wheelchair)  -discussed that I do not think that assisted living is high enough level of care, unless they get daytime caregivers for her.  Discussed with patient and her son that they need to discuss this as a family. 2.    Memory change             -Neuropsych testing done in July 2018 did not show evidence of dementia.  Family has noticed things have deteriorated since that time.  She and I discussed repeating neurocognitive testing.  She was agreeable and I will refer.  I do think that depression is influencing her memory, and she does not disagree with that either. 3.  cervical dystonia             -Jointly decided to no longer proceed with Botox.  She was not able to get here consistently and Botox just does not work when given inconsistently. 4.  F/u 5 months  Total time spent on today's visit was 45 minutes, including both face-to-face time and nonface-to-face time.  Time included that spent on review of records (prior notes available to me/labs/imaging if pertinent), discussing treatment and goals, answering patient's questions and  coordinating care.  Cc:  [name], [name], MD

## 2019-05-06 ENCOUNTER — Ambulatory Visit (INDEPENDENT_AMBULATORY_CARE_PROVIDER_SITE_OTHER): Payer: Medicare Other | Admitting: Neurology

## 2019-05-06 ENCOUNTER — Other Ambulatory Visit: Payer: Self-pay

## 2019-05-06 ENCOUNTER — Encounter: Payer: Self-pay | Admitting: Neurology

## 2019-05-06 VITALS — BP 142/84 | HR 105 | Ht 66.5 in | Wt 181.0 lb

## 2019-05-06 DIAGNOSIS — G243 Spasmodic torticollis: Secondary | ICD-10-CM

## 2019-05-06 DIAGNOSIS — R413 Other amnesia: Secondary | ICD-10-CM

## 2019-05-06 DIAGNOSIS — Z86711 Personal history of pulmonary embolism: Secondary | ICD-10-CM | POA: Diagnosis not present

## 2019-05-06 DIAGNOSIS — G20 Parkinson's disease: Secondary | ICD-10-CM

## 2019-05-06 MED ORDER — CARBIDOPA-LEVODOPA 25-100 MG PO TABS: 1.0000 | ORAL_TABLET | ORAL | 1 refills | Status: DC

## 2019-05-06 NOTE — Patient Instructions (Addendum)
1.  Decrease carbidopa/levodopa 25/100 so that you are taking 2 tablets at 7am/1 at 11am/1 at 4pm 2.  Please discuss living situation with family 3.  Stay seated.  No walking unless someone is directly there.  You have been referred for a neurocognitive evaluation in our office.   The evaluation has two parts.   . The first part of the evaluation is a clinical interview with the neuropsychologist (Dr. Melvyn Novas or Dr. Nicole Kindred). Please bring someone with you to this appointment if possible, as it is helpful for the doctor to hear from both you and another adult who knows you well.   . The second part of the evaluation is testing with the doctor's technician Hinton Dyer or Maudie Mercury). The testing includes a variety of tasks- mostly question-and-answer, some paper-and-pencil. There is nothing you need to do to prepare for this appointment, but having a good night's sleep prior to the testing, taking medications as you normally would, and bringing eyeglasses and hearing aids (if you wear them), is advised. Please make sure that you wear a mask to the appointment.  Please note: We have to reserve several hours of the neuropsychologist's time and the psychometrician's time for your evaluation appointment. As such, please note that there is a No-Show fee of $100. If you are unable to attend any of your appointments, please contact our office as soon as possible to reschedule.

## 2019-05-13 DIAGNOSIS — B351 Tinea unguium: Secondary | ICD-10-CM | POA: Diagnosis not present

## 2019-05-13 DIAGNOSIS — M79675 Pain in left toe(s): Secondary | ICD-10-CM | POA: Diagnosis not present

## 2019-05-13 DIAGNOSIS — M79674 Pain in right toe(s): Secondary | ICD-10-CM | POA: Diagnosis not present

## 2019-05-15 ENCOUNTER — Other Ambulatory Visit: Payer: Self-pay | Admitting: Family Medicine

## 2019-05-15 DIAGNOSIS — Z86711 Personal history of pulmonary embolism: Secondary | ICD-10-CM | POA: Diagnosis not present

## 2019-05-20 DIAGNOSIS — I10 Essential (primary) hypertension: Secondary | ICD-10-CM | POA: Diagnosis not present

## 2019-05-20 DIAGNOSIS — I2699 Other pulmonary embolism without acute cor pulmonale: Secondary | ICD-10-CM | POA: Diagnosis not present

## 2019-05-20 DIAGNOSIS — E039 Hypothyroidism, unspecified: Secondary | ICD-10-CM | POA: Diagnosis not present

## 2019-05-20 DIAGNOSIS — G20 Parkinson's disease: Secondary | ICD-10-CM | POA: Diagnosis not present

## 2019-05-21 DIAGNOSIS — Z7901 Long term (current) use of anticoagulants: Secondary | ICD-10-CM | POA: Diagnosis not present

## 2019-05-23 ENCOUNTER — Encounter: Payer: Self-pay | Admitting: Family Medicine

## 2019-05-23 LAB — PROTIME-INR: INR: 2.7 — AB (ref 0.9–1.1)

## 2019-05-26 DIAGNOSIS — I10 Essential (primary) hypertension: Secondary | ICD-10-CM | POA: Diagnosis not present

## 2019-05-26 DIAGNOSIS — Z7901 Long term (current) use of anticoagulants: Secondary | ICD-10-CM | POA: Diagnosis not present

## 2019-05-26 DIAGNOSIS — N390 Urinary tract infection, site not specified: Secondary | ICD-10-CM | POA: Diagnosis not present

## 2019-05-26 DIAGNOSIS — G20 Parkinson's disease: Secondary | ICD-10-CM | POA: Diagnosis not present

## 2019-05-26 DIAGNOSIS — E039 Hypothyroidism, unspecified: Secondary | ICD-10-CM | POA: Diagnosis not present

## 2019-05-26 DIAGNOSIS — J45909 Unspecified asthma, uncomplicated: Secondary | ICD-10-CM | POA: Diagnosis not present

## 2019-06-03 DIAGNOSIS — Z86711 Personal history of pulmonary embolism: Secondary | ICD-10-CM | POA: Diagnosis not present

## 2019-06-05 ENCOUNTER — Ambulatory Visit: Payer: Medicare Other | Admitting: Neurology

## 2019-06-10 DIAGNOSIS — Z86711 Personal history of pulmonary embolism: Secondary | ICD-10-CM | POA: Diagnosis not present

## 2019-06-11 DIAGNOSIS — T148XXA Other injury of unspecified body region, initial encounter: Secondary | ICD-10-CM | POA: Diagnosis not present

## 2019-06-11 DIAGNOSIS — L298 Other pruritus: Secondary | ICD-10-CM | POA: Diagnosis not present

## 2019-06-17 DIAGNOSIS — Z86711 Personal history of pulmonary embolism: Secondary | ICD-10-CM | POA: Diagnosis not present

## 2019-06-24 DIAGNOSIS — Z86711 Personal history of pulmonary embolism: Secondary | ICD-10-CM | POA: Diagnosis not present

## 2019-06-26 DIAGNOSIS — I10 Essential (primary) hypertension: Secondary | ICD-10-CM | POA: Diagnosis not present

## 2019-06-26 DIAGNOSIS — J45909 Unspecified asthma, uncomplicated: Secondary | ICD-10-CM | POA: Diagnosis not present

## 2019-06-26 DIAGNOSIS — G20 Parkinson's disease: Secondary | ICD-10-CM | POA: Diagnosis not present

## 2019-06-26 DIAGNOSIS — E039 Hypothyroidism, unspecified: Secondary | ICD-10-CM | POA: Diagnosis not present

## 2019-07-01 DIAGNOSIS — Z86711 Personal history of pulmonary embolism: Secondary | ICD-10-CM | POA: Diagnosis not present

## 2019-07-01 DIAGNOSIS — R030 Elevated blood-pressure reading, without diagnosis of hypertension: Secondary | ICD-10-CM | POA: Diagnosis not present

## 2019-07-01 DIAGNOSIS — N390 Urinary tract infection, site not specified: Secondary | ICD-10-CM | POA: Diagnosis not present

## 2019-07-02 DIAGNOSIS — I10 Essential (primary) hypertension: Secondary | ICD-10-CM | POA: Diagnosis not present

## 2019-07-02 DIAGNOSIS — R5383 Other fatigue: Secondary | ICD-10-CM | POA: Diagnosis not present

## 2019-07-02 DIAGNOSIS — R42 Dizziness and giddiness: Secondary | ICD-10-CM | POA: Diagnosis not present

## 2019-07-02 DIAGNOSIS — Z23 Encounter for immunization: Secondary | ICD-10-CM | POA: Diagnosis not present

## 2019-07-02 DIAGNOSIS — B372 Candidiasis of skin and nail: Secondary | ICD-10-CM | POA: Diagnosis not present

## 2019-07-08 DIAGNOSIS — Z7901 Long term (current) use of anticoagulants: Secondary | ICD-10-CM | POA: Diagnosis not present

## 2019-07-09 DIAGNOSIS — N3000 Acute cystitis without hematuria: Secondary | ICD-10-CM | POA: Diagnosis not present

## 2019-07-09 DIAGNOSIS — G20 Parkinson's disease: Secondary | ICD-10-CM | POA: Diagnosis not present

## 2019-07-11 DIAGNOSIS — G20 Parkinson's disease: Secondary | ICD-10-CM | POA: Diagnosis not present

## 2019-07-11 DIAGNOSIS — N3000 Acute cystitis without hematuria: Secondary | ICD-10-CM | POA: Diagnosis not present

## 2019-07-11 DIAGNOSIS — Z9181 History of falling: Secondary | ICD-10-CM | POA: Diagnosis not present

## 2019-07-11 DIAGNOSIS — Z7901 Long term (current) use of anticoagulants: Secondary | ICD-10-CM | POA: Diagnosis not present

## 2019-07-11 DIAGNOSIS — Z7982 Long term (current) use of aspirin: Secondary | ICD-10-CM | POA: Diagnosis not present

## 2019-07-11 DIAGNOSIS — G243 Spasmodic torticollis: Secondary | ICD-10-CM | POA: Diagnosis not present

## 2019-07-11 DIAGNOSIS — E039 Hypothyroidism, unspecified: Secondary | ICD-10-CM | POA: Diagnosis not present

## 2019-07-11 DIAGNOSIS — I10 Essential (primary) hypertension: Secondary | ICD-10-CM | POA: Diagnosis not present

## 2019-07-11 DIAGNOSIS — B9620 Unspecified Escherichia coli [E. coli] as the cause of diseases classified elsewhere: Secondary | ICD-10-CM | POA: Diagnosis not present

## 2019-07-11 DIAGNOSIS — Z86711 Personal history of pulmonary embolism: Secondary | ICD-10-CM | POA: Diagnosis not present

## 2019-07-11 DIAGNOSIS — Z8616 Personal history of COVID-19: Secondary | ICD-10-CM | POA: Diagnosis not present

## 2019-07-11 DIAGNOSIS — B964 Proteus (mirabilis) (morganii) as the cause of diseases classified elsewhere: Secondary | ICD-10-CM | POA: Diagnosis not present

## 2019-07-11 DIAGNOSIS — H9190 Unspecified hearing loss, unspecified ear: Secondary | ICD-10-CM | POA: Diagnosis not present

## 2019-07-11 DIAGNOSIS — E785 Hyperlipidemia, unspecified: Secondary | ICD-10-CM | POA: Diagnosis not present

## 2019-07-11 DIAGNOSIS — J45909 Unspecified asthma, uncomplicated: Secondary | ICD-10-CM | POA: Diagnosis not present

## 2019-07-14 DIAGNOSIS — Z86711 Personal history of pulmonary embolism: Secondary | ICD-10-CM | POA: Diagnosis not present

## 2019-07-15 DIAGNOSIS — B351 Tinea unguium: Secondary | ICD-10-CM | POA: Diagnosis not present

## 2019-07-15 DIAGNOSIS — M79674 Pain in right toe(s): Secondary | ICD-10-CM | POA: Diagnosis not present

## 2019-07-15 DIAGNOSIS — M79675 Pain in left toe(s): Secondary | ICD-10-CM | POA: Diagnosis not present

## 2019-07-16 DIAGNOSIS — Z86711 Personal history of pulmonary embolism: Secondary | ICD-10-CM | POA: Diagnosis not present

## 2019-07-17 DIAGNOSIS — N3000 Acute cystitis without hematuria: Secondary | ICD-10-CM | POA: Diagnosis not present

## 2019-07-17 DIAGNOSIS — B9620 Unspecified Escherichia coli [E. coli] as the cause of diseases classified elsewhere: Secondary | ICD-10-CM | POA: Diagnosis not present

## 2019-07-17 DIAGNOSIS — G243 Spasmodic torticollis: Secondary | ICD-10-CM | POA: Diagnosis not present

## 2019-07-17 DIAGNOSIS — G20 Parkinson's disease: Secondary | ICD-10-CM | POA: Diagnosis not present

## 2019-07-17 DIAGNOSIS — I10 Essential (primary) hypertension: Secondary | ICD-10-CM | POA: Diagnosis not present

## 2019-07-17 DIAGNOSIS — B964 Proteus (mirabilis) (morganii) as the cause of diseases classified elsewhere: Secondary | ICD-10-CM | POA: Diagnosis not present

## 2019-07-21 DIAGNOSIS — G243 Spasmodic torticollis: Secondary | ICD-10-CM | POA: Diagnosis not present

## 2019-07-21 DIAGNOSIS — B964 Proteus (mirabilis) (morganii) as the cause of diseases classified elsewhere: Secondary | ICD-10-CM | POA: Diagnosis not present

## 2019-07-21 DIAGNOSIS — Z86711 Personal history of pulmonary embolism: Secondary | ICD-10-CM | POA: Diagnosis not present

## 2019-07-21 DIAGNOSIS — I10 Essential (primary) hypertension: Secondary | ICD-10-CM | POA: Diagnosis not present

## 2019-07-21 DIAGNOSIS — B9620 Unspecified Escherichia coli [E. coli] as the cause of diseases classified elsewhere: Secondary | ICD-10-CM | POA: Diagnosis not present

## 2019-07-21 DIAGNOSIS — G20 Parkinson's disease: Secondary | ICD-10-CM | POA: Diagnosis not present

## 2019-07-21 DIAGNOSIS — N3000 Acute cystitis without hematuria: Secondary | ICD-10-CM | POA: Diagnosis not present

## 2019-07-24 DIAGNOSIS — B964 Proteus (mirabilis) (morganii) as the cause of diseases classified elsewhere: Secondary | ICD-10-CM | POA: Diagnosis not present

## 2019-07-24 DIAGNOSIS — G20 Parkinson's disease: Secondary | ICD-10-CM | POA: Diagnosis not present

## 2019-07-24 DIAGNOSIS — N3000 Acute cystitis without hematuria: Secondary | ICD-10-CM | POA: Diagnosis not present

## 2019-07-24 DIAGNOSIS — B9620 Unspecified Escherichia coli [E. coli] as the cause of diseases classified elsewhere: Secondary | ICD-10-CM | POA: Diagnosis not present

## 2019-07-24 DIAGNOSIS — Z86711 Personal history of pulmonary embolism: Secondary | ICD-10-CM | POA: Diagnosis not present

## 2019-07-24 DIAGNOSIS — I10 Essential (primary) hypertension: Secondary | ICD-10-CM | POA: Diagnosis not present

## 2019-07-24 DIAGNOSIS — G243 Spasmodic torticollis: Secondary | ICD-10-CM | POA: Diagnosis not present

## 2019-07-29 DIAGNOSIS — Z86711 Personal history of pulmonary embolism: Secondary | ICD-10-CM | POA: Diagnosis not present

## 2019-07-30 DIAGNOSIS — N3000 Acute cystitis without hematuria: Secondary | ICD-10-CM | POA: Diagnosis not present

## 2019-07-30 DIAGNOSIS — I10 Essential (primary) hypertension: Secondary | ICD-10-CM | POA: Diagnosis not present

## 2019-07-30 DIAGNOSIS — Z87448 Personal history of other diseases of urinary system: Secondary | ICD-10-CM | POA: Diagnosis not present

## 2019-07-30 DIAGNOSIS — G243 Spasmodic torticollis: Secondary | ICD-10-CM | POA: Diagnosis not present

## 2019-07-30 DIAGNOSIS — G20 Parkinson's disease: Secondary | ICD-10-CM | POA: Diagnosis not present

## 2019-07-30 DIAGNOSIS — B964 Proteus (mirabilis) (morganii) as the cause of diseases classified elsewhere: Secondary | ICD-10-CM | POA: Diagnosis not present

## 2019-07-30 DIAGNOSIS — B9620 Unspecified Escherichia coli [E. coli] as the cause of diseases classified elsewhere: Secondary | ICD-10-CM | POA: Diagnosis not present

## 2019-07-31 DIAGNOSIS — G243 Spasmodic torticollis: Secondary | ICD-10-CM | POA: Diagnosis not present

## 2019-07-31 DIAGNOSIS — B9620 Unspecified Escherichia coli [E. coli] as the cause of diseases classified elsewhere: Secondary | ICD-10-CM | POA: Diagnosis not present

## 2019-07-31 DIAGNOSIS — G20 Parkinson's disease: Secondary | ICD-10-CM | POA: Diagnosis not present

## 2019-07-31 DIAGNOSIS — B964 Proteus (mirabilis) (morganii) as the cause of diseases classified elsewhere: Secondary | ICD-10-CM | POA: Diagnosis not present

## 2019-07-31 DIAGNOSIS — Z86711 Personal history of pulmonary embolism: Secondary | ICD-10-CM | POA: Diagnosis not present

## 2019-07-31 DIAGNOSIS — I10 Essential (primary) hypertension: Secondary | ICD-10-CM | POA: Diagnosis not present

## 2019-07-31 DIAGNOSIS — N3000 Acute cystitis without hematuria: Secondary | ICD-10-CM | POA: Diagnosis not present

## 2019-08-01 DIAGNOSIS — E039 Hypothyroidism, unspecified: Secondary | ICD-10-CM | POA: Diagnosis not present

## 2019-08-01 DIAGNOSIS — I10 Essential (primary) hypertension: Secondary | ICD-10-CM | POA: Diagnosis not present

## 2019-08-01 DIAGNOSIS — J45909 Unspecified asthma, uncomplicated: Secondary | ICD-10-CM | POA: Diagnosis not present

## 2019-08-01 DIAGNOSIS — G20 Parkinson's disease: Secondary | ICD-10-CM | POA: Diagnosis not present

## 2019-08-05 DIAGNOSIS — Z86711 Personal history of pulmonary embolism: Secondary | ICD-10-CM | POA: Diagnosis not present

## 2019-08-07 DIAGNOSIS — B964 Proteus (mirabilis) (morganii) as the cause of diseases classified elsewhere: Secondary | ICD-10-CM | POA: Diagnosis not present

## 2019-08-07 DIAGNOSIS — G20 Parkinson's disease: Secondary | ICD-10-CM | POA: Diagnosis not present

## 2019-08-07 DIAGNOSIS — I10 Essential (primary) hypertension: Secondary | ICD-10-CM | POA: Diagnosis not present

## 2019-08-07 DIAGNOSIS — B9620 Unspecified Escherichia coli [E. coli] as the cause of diseases classified elsewhere: Secondary | ICD-10-CM | POA: Diagnosis not present

## 2019-08-07 DIAGNOSIS — N3000 Acute cystitis without hematuria: Secondary | ICD-10-CM | POA: Diagnosis not present

## 2019-08-07 DIAGNOSIS — G243 Spasmodic torticollis: Secondary | ICD-10-CM | POA: Diagnosis not present

## 2019-08-10 DIAGNOSIS — G20 Parkinson's disease: Secondary | ICD-10-CM | POA: Diagnosis not present

## 2019-08-12 DIAGNOSIS — Z86711 Personal history of pulmonary embolism: Secondary | ICD-10-CM | POA: Diagnosis not present

## 2019-08-19 DIAGNOSIS — Z86711 Personal history of pulmonary embolism: Secondary | ICD-10-CM | POA: Diagnosis not present

## 2019-09-09 DIAGNOSIS — Z86711 Personal history of pulmonary embolism: Secondary | ICD-10-CM | POA: Diagnosis not present

## 2019-09-10 NOTE — Progress Notes (Signed)
Pt did not answer x3. LVM for pt to reschedule.

## 2019-09-11 ENCOUNTER — Other Ambulatory Visit: Payer: Self-pay

## 2019-09-11 ENCOUNTER — Ambulatory Visit: Payer: Medicare Other | Admitting: *Deleted

## 2019-09-11 DIAGNOSIS — G9389 Other specified disorders of brain: Secondary | ICD-10-CM | POA: Diagnosis not present

## 2019-09-11 DIAGNOSIS — S6991XA Unspecified injury of right wrist, hand and finger(s), initial encounter: Secondary | ICD-10-CM | POA: Diagnosis not present

## 2019-09-11 DIAGNOSIS — S42022A Displaced fracture of shaft of left clavicle, initial encounter for closed fracture: Secondary | ICD-10-CM | POA: Diagnosis not present

## 2019-09-11 DIAGNOSIS — S60011A Contusion of right thumb without damage to nail, initial encounter: Secondary | ICD-10-CM | POA: Diagnosis not present

## 2019-09-11 DIAGNOSIS — S0990XA Unspecified injury of head, initial encounter: Secondary | ICD-10-CM | POA: Diagnosis not present

## 2019-09-11 DIAGNOSIS — J3489 Other specified disorders of nose and nasal sinuses: Secondary | ICD-10-CM | POA: Diagnosis not present

## 2019-09-11 DIAGNOSIS — I7090 Unspecified atherosclerosis: Secondary | ICD-10-CM | POA: Diagnosis not present

## 2019-09-11 DIAGNOSIS — W19XXXA Unspecified fall, initial encounter: Secondary | ICD-10-CM | POA: Diagnosis not present

## 2019-09-11 DIAGNOSIS — Z8679 Personal history of other diseases of the circulatory system: Secondary | ICD-10-CM | POA: Diagnosis not present

## 2019-09-11 DIAGNOSIS — S199XXA Unspecified injury of neck, initial encounter: Secondary | ICD-10-CM | POA: Diagnosis not present

## 2019-09-11 DIAGNOSIS — I6789 Other cerebrovascular disease: Secondary | ICD-10-CM | POA: Diagnosis not present

## 2019-09-11 DIAGNOSIS — N390 Urinary tract infection, site not specified: Secondary | ICD-10-CM | POA: Diagnosis not present

## 2019-09-11 DIAGNOSIS — R52 Pain, unspecified: Secondary | ICD-10-CM | POA: Diagnosis not present

## 2019-09-11 DIAGNOSIS — R609 Edema, unspecified: Secondary | ICD-10-CM | POA: Diagnosis not present

## 2019-09-11 DIAGNOSIS — M79603 Pain in arm, unspecified: Secondary | ICD-10-CM | POA: Diagnosis not present

## 2019-09-12 DIAGNOSIS — Z743 Need for continuous supervision: Secondary | ICD-10-CM | POA: Diagnosis not present

## 2019-09-12 DIAGNOSIS — R279 Unspecified lack of coordination: Secondary | ICD-10-CM | POA: Diagnosis not present

## 2019-09-12 DIAGNOSIS — R5381 Other malaise: Secondary | ICD-10-CM | POA: Diagnosis not present

## 2019-09-15 DIAGNOSIS — M4692 Unspecified inflammatory spondylopathy, cervical region: Secondary | ICD-10-CM | POA: Diagnosis present

## 2019-09-15 DIAGNOSIS — R296 Repeated falls: Secondary | ICD-10-CM | POA: Diagnosis present

## 2019-09-15 DIAGNOSIS — K5909 Other constipation: Secondary | ICD-10-CM | POA: Diagnosis not present

## 2019-09-15 DIAGNOSIS — E038 Other specified hypothyroidism: Secondary | ICD-10-CM | POA: Diagnosis not present

## 2019-09-15 DIAGNOSIS — I10 Essential (primary) hypertension: Secondary | ICD-10-CM | POA: Diagnosis not present

## 2019-09-15 DIAGNOSIS — G20 Parkinson's disease: Secondary | ICD-10-CM | POA: Diagnosis not present

## 2019-09-15 DIAGNOSIS — N182 Chronic kidney disease, stage 2 (mild): Secondary | ICD-10-CM | POA: Diagnosis not present

## 2019-09-15 DIAGNOSIS — M0689 Other specified rheumatoid arthritis, multiple sites: Secondary | ICD-10-CM | POA: Diagnosis present

## 2019-09-15 DIAGNOSIS — S42002A Fracture of unspecified part of left clavicle, initial encounter for closed fracture: Secondary | ICD-10-CM | POA: Diagnosis not present

## 2019-09-15 DIAGNOSIS — Z1612 Extended spectrum beta lactamase (ESBL) resistance: Secondary | ICD-10-CM | POA: Diagnosis present

## 2019-09-15 DIAGNOSIS — B9620 Unspecified Escherichia coli [E. coli] as the cause of diseases classified elsewhere: Secondary | ICD-10-CM | POA: Diagnosis not present

## 2019-09-15 DIAGNOSIS — I2699 Other pulmonary embolism without acute cor pulmonale: Secondary | ICD-10-CM | POA: Diagnosis not present

## 2019-09-15 DIAGNOSIS — F064 Anxiety disorder due to known physiological condition: Secondary | ICD-10-CM | POA: Diagnosis not present

## 2019-09-15 DIAGNOSIS — S42002D Fracture of unspecified part of left clavicle, subsequent encounter for fracture with routine healing: Secondary | ICD-10-CM | POA: Diagnosis present

## 2019-09-15 DIAGNOSIS — R5381 Other malaise: Secondary | ICD-10-CM | POA: Diagnosis not present

## 2019-09-15 DIAGNOSIS — D6489 Other specified anemias: Secondary | ICD-10-CM | POA: Diagnosis not present

## 2019-09-15 DIAGNOSIS — N390 Urinary tract infection, site not specified: Secondary | ICD-10-CM | POA: Diagnosis not present

## 2019-09-15 DIAGNOSIS — J45998 Other asthma: Secondary | ICD-10-CM | POA: Diagnosis not present

## 2019-09-15 DIAGNOSIS — F320 Major depressive disorder, single episode, mild: Secondary | ICD-10-CM | POA: Diagnosis not present

## 2019-09-15 DIAGNOSIS — I5032 Chronic diastolic (congestive) heart failure: Secondary | ICD-10-CM | POA: Diagnosis present

## 2019-09-15 DIAGNOSIS — M797 Fibromyalgia: Secondary | ICD-10-CM | POA: Diagnosis present

## 2019-09-15 DIAGNOSIS — E7849 Other hyperlipidemia: Secondary | ICD-10-CM | POA: Diagnosis not present

## 2019-09-15 DIAGNOSIS — R4189 Other symptoms and signs involving cognitive functions and awareness: Secondary | ICD-10-CM | POA: Diagnosis not present

## 2019-09-19 DIAGNOSIS — I2699 Other pulmonary embolism without acute cor pulmonale: Secondary | ICD-10-CM | POA: Diagnosis not present

## 2019-09-19 DIAGNOSIS — I10 Essential (primary) hypertension: Secondary | ICD-10-CM | POA: Diagnosis not present

## 2019-09-19 DIAGNOSIS — E038 Other specified hypothyroidism: Secondary | ICD-10-CM | POA: Diagnosis not present

## 2019-09-19 DIAGNOSIS — F064 Anxiety disorder due to known physiological condition: Secondary | ICD-10-CM | POA: Diagnosis not present

## 2019-09-19 DIAGNOSIS — E7849 Other hyperlipidemia: Secondary | ICD-10-CM | POA: Diagnosis not present

## 2019-09-19 DIAGNOSIS — R4189 Other symptoms and signs involving cognitive functions and awareness: Secondary | ICD-10-CM | POA: Diagnosis not present

## 2019-09-19 DIAGNOSIS — R5381 Other malaise: Secondary | ICD-10-CM | POA: Diagnosis not present

## 2019-09-19 DIAGNOSIS — G20 Parkinson's disease: Secondary | ICD-10-CM | POA: Diagnosis not present

## 2019-09-19 DIAGNOSIS — F320 Major depressive disorder, single episode, mild: Secondary | ICD-10-CM | POA: Diagnosis not present

## 2019-09-19 DIAGNOSIS — S42002A Fracture of unspecified part of left clavicle, initial encounter for closed fracture: Secondary | ICD-10-CM | POA: Diagnosis not present

## 2019-09-19 DIAGNOSIS — J45998 Other asthma: Secondary | ICD-10-CM | POA: Diagnosis not present

## 2019-09-19 DIAGNOSIS — K5909 Other constipation: Secondary | ICD-10-CM | POA: Diagnosis not present

## 2019-09-22 DIAGNOSIS — S42002A Fracture of unspecified part of left clavicle, initial encounter for closed fracture: Secondary | ICD-10-CM | POA: Diagnosis not present

## 2019-09-22 DIAGNOSIS — N390 Urinary tract infection, site not specified: Secondary | ICD-10-CM | POA: Diagnosis not present

## 2019-09-22 DIAGNOSIS — I2699 Other pulmonary embolism without acute cor pulmonale: Secondary | ICD-10-CM | POA: Diagnosis not present

## 2019-09-23 ENCOUNTER — Telehealth: Payer: Self-pay | Admitting: Family Medicine

## 2019-09-23 NOTE — Telephone Encounter (Signed)
Left message for patient to call back and schedule Medicare Annual Wellness Visit (AWV) with Nurse Health Advisor   This should be a 5 MINUTE VISIT.  Last AWV 07/09/18

## 2019-09-24 DIAGNOSIS — N390 Urinary tract infection, site not specified: Secondary | ICD-10-CM | POA: Diagnosis not present

## 2019-09-24 DIAGNOSIS — S42002A Fracture of unspecified part of left clavicle, initial encounter for closed fracture: Secondary | ICD-10-CM | POA: Diagnosis not present

## 2019-09-24 DIAGNOSIS — B9620 Unspecified Escherichia coli [E. coli] as the cause of diseases classified elsewhere: Secondary | ICD-10-CM | POA: Diagnosis not present

## 2019-09-26 DIAGNOSIS — F320 Major depressive disorder, single episode, mild: Secondary | ICD-10-CM | POA: Diagnosis not present

## 2019-09-26 DIAGNOSIS — I2699 Other pulmonary embolism without acute cor pulmonale: Secondary | ICD-10-CM | POA: Diagnosis not present

## 2019-09-26 DIAGNOSIS — I10 Essential (primary) hypertension: Secondary | ICD-10-CM | POA: Diagnosis not present

## 2019-09-26 DIAGNOSIS — G20 Parkinson's disease: Secondary | ICD-10-CM | POA: Diagnosis not present

## 2019-09-26 DIAGNOSIS — E7849 Other hyperlipidemia: Secondary | ICD-10-CM | POA: Diagnosis not present

## 2019-09-26 DIAGNOSIS — N182 Chronic kidney disease, stage 2 (mild): Secondary | ICD-10-CM | POA: Diagnosis not present

## 2019-09-26 DIAGNOSIS — E038 Other specified hypothyroidism: Secondary | ICD-10-CM | POA: Diagnosis not present

## 2019-09-26 DIAGNOSIS — D6489 Other specified anemias: Secondary | ICD-10-CM | POA: Diagnosis not present

## 2019-09-26 DIAGNOSIS — S42002A Fracture of unspecified part of left clavicle, initial encounter for closed fracture: Secondary | ICD-10-CM | POA: Diagnosis not present

## 2019-09-30 DIAGNOSIS — M79675 Pain in left toe(s): Secondary | ICD-10-CM | POA: Diagnosis not present

## 2019-09-30 DIAGNOSIS — M797 Fibromyalgia: Secondary | ICD-10-CM | POA: Diagnosis not present

## 2019-09-30 DIAGNOSIS — D649 Anemia, unspecified: Secondary | ICD-10-CM | POA: Diagnosis not present

## 2019-09-30 DIAGNOSIS — M79674 Pain in right toe(s): Secondary | ICD-10-CM | POA: Diagnosis not present

## 2019-09-30 DIAGNOSIS — E785 Hyperlipidemia, unspecified: Secondary | ICD-10-CM | POA: Diagnosis not present

## 2019-09-30 DIAGNOSIS — M47812 Spondylosis without myelopathy or radiculopathy, cervical region: Secondary | ICD-10-CM | POA: Diagnosis not present

## 2019-09-30 DIAGNOSIS — Z96659 Presence of unspecified artificial knee joint: Secondary | ICD-10-CM | POA: Diagnosis not present

## 2019-09-30 DIAGNOSIS — K5901 Slow transit constipation: Secondary | ICD-10-CM | POA: Diagnosis not present

## 2019-09-30 DIAGNOSIS — I110 Hypertensive heart disease with heart failure: Secondary | ICD-10-CM | POA: Diagnosis not present

## 2019-09-30 DIAGNOSIS — I252 Old myocardial infarction: Secondary | ICD-10-CM | POA: Diagnosis not present

## 2019-09-30 DIAGNOSIS — G243 Spasmodic torticollis: Secondary | ICD-10-CM | POA: Diagnosis not present

## 2019-09-30 DIAGNOSIS — E039 Hypothyroidism, unspecified: Secondary | ICD-10-CM | POA: Diagnosis not present

## 2019-09-30 DIAGNOSIS — Z9181 History of falling: Secondary | ICD-10-CM | POA: Diagnosis not present

## 2019-09-30 DIAGNOSIS — M069 Rheumatoid arthritis, unspecified: Secondary | ICD-10-CM | POA: Diagnosis not present

## 2019-09-30 DIAGNOSIS — F329 Major depressive disorder, single episode, unspecified: Secondary | ICD-10-CM | POA: Diagnosis not present

## 2019-09-30 DIAGNOSIS — R4182 Altered mental status, unspecified: Secondary | ICD-10-CM | POA: Diagnosis not present

## 2019-09-30 DIAGNOSIS — G20 Parkinson's disease: Secondary | ICD-10-CM | POA: Diagnosis not present

## 2019-09-30 DIAGNOSIS — Z8616 Personal history of COVID-19: Secondary | ICD-10-CM | POA: Diagnosis not present

## 2019-09-30 DIAGNOSIS — H9190 Unspecified hearing loss, unspecified ear: Secondary | ICD-10-CM | POA: Diagnosis not present

## 2019-09-30 DIAGNOSIS — H409 Unspecified glaucoma: Secondary | ICD-10-CM | POA: Diagnosis not present

## 2019-09-30 DIAGNOSIS — Z87440 Personal history of urinary (tract) infections: Secondary | ICD-10-CM | POA: Diagnosis not present

## 2019-09-30 DIAGNOSIS — B351 Tinea unguium: Secondary | ICD-10-CM | POA: Diagnosis not present

## 2019-09-30 DIAGNOSIS — I509 Heart failure, unspecified: Secondary | ICD-10-CM | POA: Diagnosis not present

## 2019-09-30 DIAGNOSIS — S42002D Fracture of unspecified part of left clavicle, subsequent encounter for fracture with routine healing: Secondary | ICD-10-CM | POA: Diagnosis not present

## 2019-09-30 DIAGNOSIS — M170 Bilateral primary osteoarthritis of knee: Secondary | ICD-10-CM | POA: Diagnosis not present

## 2019-09-30 DIAGNOSIS — J45909 Unspecified asthma, uncomplicated: Secondary | ICD-10-CM | POA: Diagnosis not present

## 2019-09-30 DIAGNOSIS — Z87891 Personal history of nicotine dependence: Secondary | ICD-10-CM | POA: Diagnosis not present

## 2019-09-30 DIAGNOSIS — Z86711 Personal history of pulmonary embolism: Secondary | ICD-10-CM | POA: Diagnosis not present

## 2019-09-30 NOTE — Progress Notes (Deleted)
[facility] [address] [place], [place] [address] [phone]  Date:  10/08/2019   Name:  Vaanya Shambaugh   DOB:  07-15-1945   MRN:  [id number]  PCP:  Darreld Mclean, MD    Chief Complaint: No chief complaint on file.   History of Present Illness:  Brandilyn Nanninga is a 74 y.o. very pleasant female patient who presents with the following:  Medicare pt here today for a CPE Last seen by myself in November  She has a somewhat complex medical history, including Parkinson's disease, recurrent UTI, pulmonary embolism due to hypercoagulable state on chronic Coumadin with home INR machine, gait disturbance, TIA, hypertension, hypothyroidism  covid series: Labs:  Due today  Can schedule dexa for 10/21 mammo ?2019 Colon cancer:   Seen in the ER on 7/8 with UTI and confusion, fall- she broke her left clavicle  Saw her neurologist Dr Tat in March-  1.  Parkinsonism -It is somewhat unclear the dose of levodopa, but I am going to reduce it back from what appears to be 2 tablets 3 times per day to which she was previously on when I saw her, which was carbidopa/levodopa 25/100, 2 tablets in the morning, 1 in the afternoon and 1 in evening.  This is primarily because of hallucinations, and because of the fact that I asked her not to walk any longer (we have discussed this previously).             -Called assisted-living several times for updated list of medications.  We did not receive it.  I will continue to try to get a copy.             -Discussed the importance of compliance with follow-up visits.  Until today, I actually had not seen her since 2019 because of multiple no-shows and cancellations.  Discussed that for her overall health and wellness, I really need to see her on a fairly regular basis, even if that means every 5 or 6 months.             -discussed that she needs to stay seated at all times (Wheelchair)              -discussed that I do not think that assisted living is high enough level of care, unless they get daytime caregivers for her.  Discussed with patient and her son that they need to discuss this as a family. 2.   Memory change -Neuropsych testing done in July 2018 did not show evidence of dementia.  Family has noticed things have deteriorated since that time.  She and I discussed repeating neurocognitive testing.  She was agreeable and I will refer.  I do think that depression is influencing her memory, and she does not disagree with that either. 3. cervical dystonia -Jointly decided to no longer proceed with Botox.  She was not able to get here consistently and Botox just does not work when given inconsistently.  Patient Active Problem List   Diagnosis Date Noted  . Acute metabolic encephalopathy 00/37/0488  . Complicated UTI (urinary tract infection) 01/03/2019  . Subtherapeutic international normalized ratio (INR) 01/03/2019  . Hyponatremia 01/03/2019  . Pre-diabetes 08/28/2018  . Adenoma of right adrenal gland 02/20/2018  . Osteopenia 12/18/2017  . Glaucoma 10/13/2017  . Transient cerebral ischemic attack 10/13/2017  . History of prediabetes 10/13/2017  . AKI (acute kidney injury) ([place]) 02/12/2017  . Recurrent  UTI 11/23/2016  . Fibromyalgia 11/23/2016  . Pulmonary HTN ([place]) 11/23/2016  . Left ventricular diastolic dysfunction 63/81/7711  . History of pulmonary embolus (PE) 2/2 hypercoagulable state 11/23/2016  . Sepsis ([place]) 09/17/2016  . SIRS (systemic inflammatory response syndrome) ([place]) 04/29/2016  . Hyperlipidemia 04/29/2016  . Hypothyroidism 04/29/2016  . Hematuria 04/05/2016  . Benign essential HTN   . Displaced fracture of fifth cervical vertebra ([place]) 12/17/2015  . Gait disturbance 12/17/2015  . Trochanteric bursitis of both hips 09/13/2015  . Chronic low back pain 06/07/2015  . Parkinson's disease ([place]) 05/17/2015  . S/P right TKA  05/22/2011    Past Medical History:  Diagnosis Date  . Arthritis   . Chronic insomnia   . Complication of anesthesia    severe itching night of surgery requiring meds- also couldnt swallow- throat "paralyzed""  . Fibromyalgia   . Glaucoma   . Hypercholesterolemia   . Hyperlipidemia   . Hypertension    PCP Dr Cari Caraway- [place]  05/15/11 with clearance and note on chart,    chest x ray, EKG 12/12 [facility], eccho 7/11 [facility]  . Hypothyroidism    s/p graves disease  . Kidney stone   . PD (Parkinson's disease) ([place])   . Peripheral vascular disease ([place])    PULMONARY EMBOLUS x 2- 2011, 2012/ FOLLOWED BY DR [id number] 12/12 [facility]   PT [facility] WILL STOP COUMADIN 3/12 and begin LOVONOX 05/17/11 as previously instructed  . Prediabetes   . Sinus infection    at present- dr Honor Loh PA aware- was seen there and at PCP 05/10/11  . Thyroid disease    Hypothyroidism    Past Surgical History:  Procedure Laterality Date  . ABDOMINAL HYSTERECTOMY    . APPENDECTOMY    . CATARACT EXTRACTION Bilateral   . CHOLECYSTECTOMY    . COLONOSCOPY    . EXPLORATORY LAPAROTOMY    . JOINT REPLACEMENT     left knee  7/12  . TOTAL KNEE ARTHROPLASTY  05/22/2011   Procedure: TOTAL KNEE ARTHROPLASTY;  Surgeon: Mauri Pole, MD;  Location: [facility];  Service: Orthopedics;  Laterality: Right;    Social History   Tobacco Use  . Smoking status: Former Smoker    Packs/day: 1.00    Quit date: 05/10/1989    Years since quitting: 30.4  . Smokeless tobacco: Never Used  Vaping Use  . Vaping Use: Never used  Substance Use Topics  . Alcohol use: Yes    Alcohol/week: 0.0 standard drinks    Comment: one drink once a month  . Drug use: No    Family History  Problem Relation Age of Onset  . Prostate cancer Father   . Stroke Father     Allergies  Allergen Reactions  . Crestor [Rosuvastatin Calcium] Other (See Comments)    Feeling very bad, aching all over.  . Erythromycin Hives  . Gabapentin Other (See Comments)     Blurred vision  . Pregabalin Other (See Comments)    Crossed vision.  . Pregabalin Other (See Comments)    Crossed vision. Unknown   . Macrobid [Nitrofurantoin] Hives  . Losartan Itching    Medication list has been reviewed and updated.  Current Outpatient Medications on File Prior to Visit  Medication Sig Dispense Refill  . acetaminophen (TYLENOL) 500 MG tablet Take 500 mg by mouth every 6 (six) hours as needed for mild pain.    Marland Kitchen amLODipine (NORVASC) 2.5 MG tablet TAKE 1 TABLET BY MOUTH ONCE DAILY 30 tablet 3  .  atorvastatin (LIPITOR) 10 MG tablet TAKE 1 TABLET BY MOUTH ONCE DAILY 30 tablet 3  . carbidopa-levodopa (SINEMET IR) 25-100 MG tablet Take 1 tablet by mouth See admin instructions. Take 2 tablets at 7am/1 at 11am/1 at 4pm 270 tablet 1  . cephALEXin (KEFLEX) 250 MG capsule Take 1 capsule (250 mg total) by mouth daily. 90 capsule 3  . Cholecalciferol (VITAMIN D3) 5000 UNITS CAPS Take 5,000 Units by mouth daily.     . cyanocobalamin 500 MCG tablet Take 500 mcg by mouth daily.    . ertapenem (INVANZ) IVPB Inject 1 g into the vein daily. Indication:  ESBL UTI Last Day of Therapy:  01/10/19 Labs - Once weekly:  CBC/D and BMP, Labs - Every other week:  ESR and CRP 4 Units 0  . levothyroxine (SYNTHROID) 100 MCG tablet Take 1 tablet (100 mcg total) by mouth daily. 90 tablet 3  . magnesium oxide (MAG-OX) 400 MG tablet Take 0.5 tablets (200 mg total) by mouth daily. 45 tablet 3  . Melatonin 5 MG TABS Take 1 tablet by mouth at bedtime as needed (for sleep).    . Menthol, Topical Analgesic, (BIOFREEZE EX) Apply 1 application topically daily as needed (for pain).    Marland Kitchen venlafaxine XR (EFFEXOR-XR) 150 MG 24 hr capsule Take 1 capsule (150 mg total) by mouth daily with breakfast. 90 capsule 3  . warfarin (COUMADIN) 4 MG tablet TAKE 1 TABLET BY MOUTH ONCE DAILY ADJUST AS DIRECTED BY MD 30 tablet 3   No current facility-administered medications on file prior to visit.    Review of  Systems:  As per HPI- otherwise negative.   Physical Examination: There were no vitals filed for this visit. There were no vitals filed for this visit. There is no height or weight on file to calculate BMI. Ideal Body Weight:    GEN: no acute distress. HEENT: Atraumatic, Normocephalic.  Ears and Nose: No external deformity. CV: RRR, No M/G/R. No JVD. No thrill. No extra heart sounds. PULM: CTA B, no wheezes, crackles, rhonchi. No retractions. No resp. distress. No accessory muscle use. ABD: S, NT, ND, +BS. No rebound. No HSM. EXTR: No c/c/e PSYCH: Normally interactive. Conversant.    Assessment and Plan: *** This visit occurred during the SARS-CoV-2 public health emergency.  Safety protocols were in place, including screening questions prior to the visit, additional usage of staff PPE, and extensive cleaning of exam room while observing appropriate contact time as indicated for disinfecting solutions.    Signed Lamar Blinks, MD

## 2019-10-01 DIAGNOSIS — S42002D Fracture of unspecified part of left clavicle, subsequent encounter for fracture with routine healing: Secondary | ICD-10-CM | POA: Diagnosis not present

## 2019-10-01 DIAGNOSIS — I509 Heart failure, unspecified: Secondary | ICD-10-CM | POA: Diagnosis not present

## 2019-10-01 DIAGNOSIS — J45909 Unspecified asthma, uncomplicated: Secondary | ICD-10-CM | POA: Diagnosis not present

## 2019-10-01 DIAGNOSIS — R4182 Altered mental status, unspecified: Secondary | ICD-10-CM | POA: Diagnosis not present

## 2019-10-01 DIAGNOSIS — R5381 Other malaise: Secondary | ICD-10-CM | POA: Diagnosis not present

## 2019-10-01 DIAGNOSIS — I2699 Other pulmonary embolism without acute cor pulmonale: Secondary | ICD-10-CM | POA: Diagnosis not present

## 2019-10-01 DIAGNOSIS — S42002A Fracture of unspecified part of left clavicle, initial encounter for closed fracture: Secondary | ICD-10-CM | POA: Diagnosis not present

## 2019-10-01 DIAGNOSIS — G20 Parkinson's disease: Secondary | ICD-10-CM | POA: Diagnosis not present

## 2019-10-01 DIAGNOSIS — I110 Hypertensive heart disease with heart failure: Secondary | ICD-10-CM | POA: Diagnosis not present

## 2019-10-04 DIAGNOSIS — I110 Hypertensive heart disease with heart failure: Secondary | ICD-10-CM | POA: Diagnosis not present

## 2019-10-04 DIAGNOSIS — I509 Heart failure, unspecified: Secondary | ICD-10-CM | POA: Diagnosis not present

## 2019-10-04 DIAGNOSIS — S42002D Fracture of unspecified part of left clavicle, subsequent encounter for fracture with routine healing: Secondary | ICD-10-CM | POA: Diagnosis not present

## 2019-10-04 DIAGNOSIS — J45909 Unspecified asthma, uncomplicated: Secondary | ICD-10-CM | POA: Diagnosis not present

## 2019-10-04 DIAGNOSIS — R4182 Altered mental status, unspecified: Secondary | ICD-10-CM | POA: Diagnosis not present

## 2019-10-04 DIAGNOSIS — G20 Parkinson's disease: Secondary | ICD-10-CM | POA: Diagnosis not present

## 2019-10-07 DIAGNOSIS — Z86711 Personal history of pulmonary embolism: Secondary | ICD-10-CM | POA: Diagnosis not present

## 2019-10-08 ENCOUNTER — Encounter: Payer: Medicare Other | Admitting: Family Medicine

## 2019-10-08 DIAGNOSIS — R4182 Altered mental status, unspecified: Secondary | ICD-10-CM | POA: Diagnosis not present

## 2019-10-08 DIAGNOSIS — I509 Heart failure, unspecified: Secondary | ICD-10-CM | POA: Diagnosis not present

## 2019-10-08 DIAGNOSIS — S42002D Fracture of unspecified part of left clavicle, subsequent encounter for fracture with routine healing: Secondary | ICD-10-CM | POA: Diagnosis not present

## 2019-10-08 DIAGNOSIS — J45909 Unspecified asthma, uncomplicated: Secondary | ICD-10-CM | POA: Diagnosis not present

## 2019-10-08 DIAGNOSIS — G20 Parkinson's disease: Secondary | ICD-10-CM | POA: Diagnosis not present

## 2019-10-08 DIAGNOSIS — I110 Hypertensive heart disease with heart failure: Secondary | ICD-10-CM | POA: Diagnosis not present

## 2019-10-09 NOTE — Progress Notes (Signed)
Assessment/Plan:   1.  Parkinsonism, atypical state  -Continue carbidopa/levodopa 25/100, 2/1/1  -Discussed again the importance of staying seated at all times (wheelchair).  -Discussed the importance of regular visits here at the office.  Discussed level of care at home. 2.  Memory change  -We had referred for neurocognitive testing at our office, but unfortunately when we called to schedule, they did not return the phone calls.  Memory seems much worse today and not sure that all is mood related.  She is in a better but not perfect living situation and i'm worried that more care may be needed.  Daughter and patient agreeable to rescheduling neurocognitive testing.   Subjective:   [name] [name] was seen today in follow up for Parkinsonism.  My previous records were reviewed prior to todays visit as well as outside records available to me.  This patient is accompanied in the office by her daughter who supplements the history. Pt was told to decrease her dose of levodopa.  In regards to hallucinations, she reports that she is not having them at all. I also asked her again to not walk unless somebody was directly with her.  We referred her for neurocognitive testing.  Our office called multiple times to make the appointment, but we were only able to leave messages and no one responded to the messages.  Daughter states that they have changed the phone number to her daughters.  Pts daughter states that memory is not good but pt states that it is good.  She is in assisted living.  They assist with dressing, meals, meds.  Pt reports falls - has gotten up and not asked for help.  Clavicle fracture in July.  Not locking WC.  Swallowing is good.  Daughter states that she is having frequent UTI's which seem to make falls worse.    Current prescribed movement disorder medications: Carbidopa/levodopa 25/100, 2 tablets at 7 AM/1 tablet at 11 AM/1 tablet at 4 PM (decreased last visit)   ALLERGIES:     Allergies  Allergen Reactions  . Crestor [Rosuvastatin Calcium] Other (See Comments)    Feeling very bad, aching all over.  . Erythromycin Hives  . Gabapentin Other (See Comments)    Blurred vision  . Pregabalin Other (See Comments)    Crossed vision.  . Pregabalin Other (See Comments)    Crossed vision. Unknown   . Macrobid [Nitrofurantoin] Hives  . Losartan Itching    CURRENT MEDICATIONS:  Outpatient Encounter Medications as of 10/14/2019  Medication Sig  . acetaminophen (TYLENOL) 500 MG tablet Take 500 mg by mouth every 6 (six) hours as needed for mild pain.  Marland Kitchen amLODipine (NORVASC) 2.5 MG tablet TAKE 1 TABLET BY MOUTH ONCE DAILY  . aspirin EC 81 MG tablet Take 81 mg by mouth daily. Swallow whole.  Marland Kitchen atorvastatin (LIPITOR) 10 MG tablet TAKE 1 TABLET BY MOUTH ONCE DAILY  . carbidopa-levodopa (SINEMET IR) 25-100 MG tablet Take 1 tablet by mouth See admin instructions. Take 2 tablets at 7am/1 at 11am/1 at 4pm (Patient taking differently: Take 1 tablet by mouth in the morning and at bedtime. Take 2 tabs at 7am and 1 tab at 11am and 4pm)  . Cranberry 400 MG CAPS Take 1 capsule by mouth daily.  Marland Kitchen HYDROcodone-acetaminophen (NORCO/VICODIN) 5-325 MG tablet Take 1 tablet by mouth every 6 (six) hours as needed for moderate pain.  Marland Kitchen levothyroxine (SYNTHROID) 125 MCG tablet Take 125 mcg by mouth daily before breakfast.  . Magnesium  400 MG TABS Take 1 tablet by mouth daily.  . ondansetron (ZOFRAN) 4 MG tablet Take 4 mg by mouth every 6 (six) hours as needed for nausea or vomiting.  . senna-docusate (SENNA-PLUS) 8.6-50 MG tablet Take 1 tablet by mouth 3 (three) times daily.  Marland Kitchen venlafaxine XR (EFFEXOR-XR) 150 MG 24 hr capsule Take 1 capsule (150 mg total) by mouth daily with breakfast.  . warfarin (COUMADIN) 6 MG tablet Take 6 mg by mouth daily.  . [DISCONTINUED] cephALEXin (KEFLEX) 250 MG capsule Take 1 capsule (250 mg total) by mouth daily. (Patient not taking: Reported on 10/14/2019)  .  [DISCONTINUED] Cholecalciferol (VITAMIN D3) 5000 UNITS CAPS Take 5,000 Units by mouth daily.  (Patient not taking: Reported on 10/14/2019)  . [DISCONTINUED] cyanocobalamin 500 MCG tablet Take 500 mcg by mouth daily. (Patient not taking: Reported on 10/14/2019)  . [DISCONTINUED] ertapenem (INVANZ) IVPB Inject 1 g into the vein daily. Indication:  ESBL UTI Last Day of Therapy:  01/10/19 Labs - Once weekly:  CBC/D and BMP, Labs - Every other week:  ESR and CRP (Patient not taking: Reported on 10/14/2019)  . [DISCONTINUED] levothyroxine (SYNTHROID) 100 MCG tablet Take 1 tablet (100 mcg total) by mouth daily.  . [DISCONTINUED] magnesium oxide (MAG-OX) 400 MG tablet Take 0.5 tablets (200 mg total) by mouth daily.  . [DISCONTINUED] Melatonin 5 MG TABS Take 1 tablet by mouth at bedtime as needed (for sleep). (Patient not taking: Reported on 10/14/2019)  . [DISCONTINUED] Menthol, Topical Analgesic, (BIOFREEZE EX) Apply 1 application topically daily as needed (for pain). (Patient not taking: Reported on 10/14/2019)  . [DISCONTINUED] warfarin (COUMADIN) 4 MG tablet TAKE 1 TABLET BY MOUTH ONCE DAILY ADJUST AS DIRECTED BY MD   No facility-administered encounter medications on file as of 10/14/2019.    Objective:   PHYSICAL EXAMINATION:    VITALS:   Vitals:   10/14/19 1258  BP: 134/81  Pulse: 88  SpO2: 97%  Weight: 185 lb (83.9 kg)  Height: '5\' 6"'  (1.676 m)    GEN:  The patient appears stated age and is in NAD.  Seems to be angry/frustrated HEENT:  Normocephalic, atraumatic.  The mucous membranes are moist. The superficial temporal arteries are without ropiness or tenderness. CV:  RRR Lungs:  CTAB Neck/HEME:  There are no carotid bruits bilaterally.  Neurological examination:  Orientation: The patient is alert and oriented to person and place Cranial nerves: There is good facial symmetry with facial hypomimia.  No square wave jerks.  The speech is fluent and clear. Soft palate rises symmetrically and  there is no tongue deviation. Hearing is intact to conversational tone. Sensation: Sensation is intact to light touch throughout Motor: Strength is at least antigravity x4.  Movement examination: Tone: There is mod increased tone in the rue Abnormal movements: there is no tremor Coordination:  There is apraxia with motor commands on the L especially.  She has decremation and slowness with all rapid alternating movements in the upper extremities. Gait and Station: In wheelchair.  Not tested.  I have reviewed and interpreted the following labs independently    Chemistry      Component Value Date/Time   NA 142 01/29/2019 1517   NA 145 12/01/2016 0000   K 4.0 01/29/2019 1517   CL 103 01/29/2019 1517   CO2 26 01/29/2019 1517   BUN 9 01/29/2019 1517   BUN 10 12/01/2016 0000   CREATININE 0.70 01/29/2019 1517   GLU 106 12/01/2016 0000  Component Value Date/Time   CALCIUM 9.6 01/29/2019 1517   ALKPHOS 96 01/06/2019 0612   AST 25 01/06/2019 0612   ALT 6 01/06/2019 0612   BILITOT 0.4 01/06/2019 0612       Lab Results  Component Value Date   WBC 7.7 01/29/2019   HGB 13.9 01/29/2019   HCT 40.9 01/29/2019   MCV 85.4 01/29/2019   PLT 348 01/29/2019    Lab Results  Component Value Date   TSH 2.15 10/29/2018     Total time spent on today's visit was 30 minutes, including both face-to-face time and nonface-to-face time.  Time included that spent on review of records (prior notes available to me/labs/imaging if pertinent), discussing treatment and goals, answering patient's questions and coordinating care.  Cc:  [name], [name], MD

## 2019-10-10 DIAGNOSIS — S42002D Fracture of unspecified part of left clavicle, subsequent encounter for fracture with routine healing: Secondary | ICD-10-CM | POA: Diagnosis not present

## 2019-10-14 ENCOUNTER — Ambulatory Visit (INDEPENDENT_AMBULATORY_CARE_PROVIDER_SITE_OTHER): Payer: Medicare Other | Admitting: Neurology

## 2019-10-14 ENCOUNTER — Other Ambulatory Visit: Payer: Self-pay

## 2019-10-14 ENCOUNTER — Encounter: Payer: Self-pay | Admitting: Neurology

## 2019-10-14 VITALS — BP 134/81 | HR 88 | Ht 66.0 in | Wt 185.0 lb

## 2019-10-14 DIAGNOSIS — R413 Other amnesia: Secondary | ICD-10-CM

## 2019-10-14 DIAGNOSIS — G20 Parkinson's disease: Secondary | ICD-10-CM | POA: Diagnosis not present

## 2019-10-14 DIAGNOSIS — Z86711 Personal history of pulmonary embolism: Secondary | ICD-10-CM | POA: Diagnosis not present

## 2019-10-14 NOTE — Patient Instructions (Signed)
You have been referred for a neurocognitive evaluation in our office.   The evaluation has two parts.   . The first part of the evaluation is a clinical interview with the neuropsychologist (Dr. Merz or Dr. Stewart). Please bring someone with you to this appointment if possible, as it is helpful for the doctor to hear from both you and another adult who knows you well.   . The second part of the evaluation is testing with the doctor's technician (Dana or Kim). The testing includes a variety of tasks- mostly question-and-answer, some paper-and-pencil. There is nothing you need to do to prepare for this appointment, but having a good night's sleep prior to the testing, taking medications as you normally would, and bringing eyeglasses and hearing aids (if you wear them), is advised. Please make sure that you wear a mask to the appointment.  Please note: We have to reserve several hours of the neuropsychologist's time and the psychometrician's time for your evaluation appointment. As such, please note that there is a No-Show fee of $100. If you are unable to attend any of your appointments, please contact our office as soon as possible to reschedule.  

## 2019-10-16 DIAGNOSIS — J45909 Unspecified asthma, uncomplicated: Secondary | ICD-10-CM | POA: Diagnosis not present

## 2019-10-16 DIAGNOSIS — G20 Parkinson's disease: Secondary | ICD-10-CM | POA: Diagnosis not present

## 2019-10-16 DIAGNOSIS — I110 Hypertensive heart disease with heart failure: Secondary | ICD-10-CM | POA: Diagnosis not present

## 2019-10-16 DIAGNOSIS — R4182 Altered mental status, unspecified: Secondary | ICD-10-CM | POA: Diagnosis not present

## 2019-10-16 DIAGNOSIS — S42002D Fracture of unspecified part of left clavicle, subsequent encounter for fracture with routine healing: Secondary | ICD-10-CM | POA: Diagnosis not present

## 2019-10-16 DIAGNOSIS — I509 Heart failure, unspecified: Secondary | ICD-10-CM | POA: Diagnosis not present

## 2019-10-16 DIAGNOSIS — E876 Hypokalemia: Secondary | ICD-10-CM | POA: Diagnosis not present

## 2019-10-16 DIAGNOSIS — Z7901 Long term (current) use of anticoagulants: Secondary | ICD-10-CM | POA: Diagnosis not present

## 2019-10-17 DIAGNOSIS — S42002D Fracture of unspecified part of left clavicle, subsequent encounter for fracture with routine healing: Secondary | ICD-10-CM | POA: Diagnosis not present

## 2019-10-17 DIAGNOSIS — R4182 Altered mental status, unspecified: Secondary | ICD-10-CM | POA: Diagnosis not present

## 2019-10-17 DIAGNOSIS — I110 Hypertensive heart disease with heart failure: Secondary | ICD-10-CM | POA: Diagnosis not present

## 2019-10-17 DIAGNOSIS — I509 Heart failure, unspecified: Secondary | ICD-10-CM | POA: Diagnosis not present

## 2019-10-17 DIAGNOSIS — G20 Parkinson's disease: Secondary | ICD-10-CM | POA: Diagnosis not present

## 2019-10-17 DIAGNOSIS — J45909 Unspecified asthma, uncomplicated: Secondary | ICD-10-CM | POA: Diagnosis not present

## 2019-10-21 DIAGNOSIS — Z86711 Personal history of pulmonary embolism: Secondary | ICD-10-CM | POA: Diagnosis not present

## 2019-10-21 DIAGNOSIS — E039 Hypothyroidism, unspecified: Secondary | ICD-10-CM | POA: Diagnosis not present

## 2019-10-23 DIAGNOSIS — G20 Parkinson's disease: Secondary | ICD-10-CM | POA: Diagnosis not present

## 2019-10-23 DIAGNOSIS — S42002D Fracture of unspecified part of left clavicle, subsequent encounter for fracture with routine healing: Secondary | ICD-10-CM | POA: Diagnosis not present

## 2019-10-23 DIAGNOSIS — I110 Hypertensive heart disease with heart failure: Secondary | ICD-10-CM | POA: Diagnosis not present

## 2019-10-23 DIAGNOSIS — R4182 Altered mental status, unspecified: Secondary | ICD-10-CM | POA: Diagnosis not present

## 2019-10-23 DIAGNOSIS — I509 Heart failure, unspecified: Secondary | ICD-10-CM | POA: Diagnosis not present

## 2019-10-23 DIAGNOSIS — J45909 Unspecified asthma, uncomplicated: Secondary | ICD-10-CM | POA: Diagnosis not present

## 2019-10-27 ENCOUNTER — Encounter: Payer: Medicare Other | Admitting: Family Medicine

## 2019-10-27 DIAGNOSIS — I110 Hypertensive heart disease with heart failure: Secondary | ICD-10-CM | POA: Diagnosis not present

## 2019-10-27 DIAGNOSIS — G20 Parkinson's disease: Secondary | ICD-10-CM | POA: Diagnosis not present

## 2019-10-27 DIAGNOSIS — J45909 Unspecified asthma, uncomplicated: Secondary | ICD-10-CM | POA: Diagnosis not present

## 2019-10-27 DIAGNOSIS — S42002D Fracture of unspecified part of left clavicle, subsequent encounter for fracture with routine healing: Secondary | ICD-10-CM | POA: Diagnosis not present

## 2019-10-27 DIAGNOSIS — I509 Heart failure, unspecified: Secondary | ICD-10-CM | POA: Diagnosis not present

## 2019-10-27 DIAGNOSIS — R4182 Altered mental status, unspecified: Secondary | ICD-10-CM | POA: Diagnosis not present

## 2019-10-28 DIAGNOSIS — Z86711 Personal history of pulmonary embolism: Secondary | ICD-10-CM | POA: Diagnosis not present

## 2019-10-30 DIAGNOSIS — S42002D Fracture of unspecified part of left clavicle, subsequent encounter for fracture with routine healing: Secondary | ICD-10-CM | POA: Diagnosis not present

## 2019-10-31 DIAGNOSIS — J45909 Unspecified asthma, uncomplicated: Secondary | ICD-10-CM | POA: Diagnosis not present

## 2019-10-31 DIAGNOSIS — I10 Essential (primary) hypertension: Secondary | ICD-10-CM | POA: Diagnosis not present

## 2019-10-31 DIAGNOSIS — E039 Hypothyroidism, unspecified: Secondary | ICD-10-CM | POA: Diagnosis not present

## 2019-10-31 DIAGNOSIS — G20 Parkinson's disease: Secondary | ICD-10-CM | POA: Diagnosis not present

## 2019-11-02 NOTE — Patient Instructions (Addendum)
It was great to see you again today!  I will be in touch with your labs asap We ordered a mammogram for you today- please have done asap Please see me in about 6 months and take care

## 2019-11-02 NOTE — Progress Notes (Addendum)
[facility], [place], [place] [address] [phone]  Date:  11/03/2019   Name:  [name] [name]   DOB:  [birth year]   MRN:  [id number]  PCP:  Darreld Mclean, MD    Chief Complaint: Annual Exam (both covid shots-moderna unable to remember dates)   History of Present Illness:  [name] [name] is a 74 y.o. very pleasant female patient who presents with the following:  Patient here today for physical exam Last seen by myself in November of last year  She has a somewhat complex medical history, including Parkinson's disease, recurrent UTI, pulmonary embolism due to hypercoagulable state on chronic Coumadin, gait disturbance, TIA, hypertension She has a home INR machine which she uses to monitor her Coumadin  She was seen in the ER in July after suffering a fall at [facility] independent living, she sustained a clavicular fracture.  She moved to [facility] which is more of an assisted living They are helping to manage her meds and take care of her INR checks and coumadin adjustment; there is a physician on staff  Her clavicular fracture is healing well Weight has been stable, though she does not much like the food at her new home  Wt Readings from Last 3 Encounters:  11/03/19 185 lb (83.9 kg)  10/14/19 185 lb (83.9 kg)  05/06/19 181 lb (82.1 kg)    She just recently saw her neurologist, Dr. [name] for follow-up on 10/14/19.  She was concerned that Jalaiyah's memory is worsening, she is encouraged to stay seated due to risk of falls They are trying to set her up for a neurocognitive evaluation- this will be done towards the end of Septmber 1.  Parkinsonism, atypical state             -Continue carbidopa/levodopa 25/100, 2/1/1             -Discussed again the importance of staying seated at all times (wheelchair).             -Discussed the importance of regular visits here at the office.  Discussed  level of care at home. 2.  Memory change             -We had referred for neurocognitive testing at our office, but unfortunately when we called to schedule, they did not return the phone calls.  Memory seems much worse today and not sure that all is mood related.  She is in a better but not perfect living situation and i'm worried that more care may be needed.  Daughter and patient agreeable to rescheduling neurocognitive testing.  COVID-19 series- did 2 doses on Moderna we think she finished up in March or April.  We discussed timing of booster Shingrix-did have Zostavax Mammogram is due- will order for her  Colon cancer screening- they think done 5 years ago, patient prefers to DC screening at this time given her other health issues, which is reasonable Patient Active Problem List   Diagnosis Date Noted  . Acute metabolic encephalopathy 40/76/8088  . Complicated UTI (urinary tract infection) 01/03/2019  . Subtherapeutic international normalized ratio (INR) 01/03/2019  . Hyponatremia 01/03/2019  . Pre-diabetes 08/28/2018  . Adenoma of right adrenal gland 02/20/2018  . Osteopenia 12/18/2017  . Glaucoma 10/13/2017  . Transient cerebral ischemic attack 10/13/2017  . History of prediabetes 10/13/2017  . AKI (acute kidney injury) ([place]) 02/12/2017  . Recurrent UTI 11/23/2016  .  Fibromyalgia 11/23/2016  . Pulmonary HTN ([place]) 11/23/2016  . Left ventricular diastolic dysfunction 81/77/1165  . History of pulmonary embolus (PE) 2/2 hypercoagulable state 11/23/2016  . Sepsis ([place]) 09/17/2016  . SIRS (systemic inflammatory response syndrome) ([place]) 04/29/2016  . Hyperlipidemia 04/29/2016  . Hypothyroidism 04/29/2016  . Hematuria 04/05/2016  . Benign essential HTN   . Displaced fracture of fifth cervical vertebra ([place]) 12/17/2015  . Gait disturbance 12/17/2015  . Trochanteric bursitis of both hips 09/13/2015  . Chronic low back pain 06/07/2015  . Parkinson's disease ([place]) 05/17/2015  . S/P  right TKA 05/22/2011    Past Medical History:  Diagnosis Date  . Arthritis   . Chronic insomnia   . Complication of anesthesia    severe itching night of surgery requiring meds- also couldnt swallow- throat "paralyzed""  . Fibromyalgia   . Glaucoma   . Hypercholesterolemia   . Hyperlipidemia   . Hypertension    PCP Dr Cari Caraway- [place]  05/15/11 with clearance and note on chart,    chest x ray, EKG 12/12 [facility], eccho 7/11 [facility]  . Hypothyroidism    s/p graves disease  . Kidney stone   . PD (Parkinson's disease) ([place])   . Peripheral vascular disease ([place])    PULMONARY EMBOLUS x 2- 2011, 2012/ FOLLOWED BY DR [id number] 12/12 [facility]   PT STAES WILL STOP COUMADIN 3/12 and begin LOVONOX 05/17/11 as previously instructed  . Prediabetes   . Sinus infection    at present- dr Honor Loh PA aware- was seen there and at PCP 05/10/11  . Thyroid disease    Hypothyroidism    Past Surgical History:  Procedure Laterality Date  . ABDOMINAL HYSTERECTOMY    . APPENDECTOMY    . CATARACT EXTRACTION Bilateral   . CHOLECYSTECTOMY    . COLONOSCOPY    . EXPLORATORY LAPAROTOMY    . JOINT REPLACEMENT     left knee  7/12  . TOTAL KNEE ARTHROPLASTY  05/22/2011   Procedure: TOTAL KNEE ARTHROPLASTY;  Surgeon: Mauri Pole, MD;  Location: [facility];  Service: Orthopedics;  Laterality: Right;    Social History   Tobacco Use  . Smoking status: Former Smoker    Packs/day: 1.00    Quit date: 05/10/1989    Years since quitting: 30.5  . Smokeless tobacco: Never Used  Vaping Use  . Vaping Use: Never used  Substance Use Topics  . Alcohol use: Yes    Alcohol/week: 0.0 standard drinks    Comment: one drink once a month  . Drug use: No    Family History  Problem Relation Age of Onset  . Prostate cancer Father   . Stroke Father     Allergies  Allergen Reactions  . Crestor [Rosuvastatin Calcium] Other (See Comments)    Feeling very bad, aching all over.  . Erythromycin Hives  . Gabapentin Other (See  Comments)    Blurred vision  . Pregabalin Other (See Comments)    Crossed vision.  . Pregabalin Other (See Comments)    Crossed vision. Unknown   . Macrobid [Nitrofurantoin] Hives  . Losartan Itching    Medication list has been reviewed and updated.  Current Outpatient Medications on File Prior to Visit  Medication Sig Dispense Refill  . acetaminophen (TYLENOL) 500 MG tablet Take 500 mg by mouth every 6 (six) hours as needed for mild pain.    Marland Kitchen amLODipine (NORVASC) 2.5 MG tablet TAKE 1 TABLET BY MOUTH ONCE DAILY 30 tablet 3  . aspirin EC  81 MG tablet Take 81 mg by mouth daily. Swallow whole.    Marland Kitchen atorvastatin (LIPITOR) 10 MG tablet TAKE 1 TABLET BY MOUTH ONCE DAILY 30 tablet 3  . carbidopa-levodopa (SINEMET IR) 25-100 MG tablet Take 1 tablet by mouth See admin instructions. Take 2 tablets at 7am/1 at 11am/1 at 4pm (Patient taking differently: Take 1 tablet by mouth in the morning and at bedtime. Take 2 tabs at 7am and 1 tab at 11am and 4pm) 270 tablet 1  . Cranberry 400 MG CAPS Take 1 capsule by mouth daily.    Marland Kitchen HYDROcodone-acetaminophen (NORCO/VICODIN) 5-325 MG tablet Take 1 tablet by mouth every 6 (six) hours as needed for moderate pain.    Marland Kitchen levothyroxine (SYNTHROID) 125 MCG tablet Take 125 mcg by mouth daily before breakfast.    . Magnesium 400 MG TABS Take 1 tablet by mouth daily.    . ondansetron (ZOFRAN) 4 MG tablet Take 4 mg by mouth every 6 (six) hours as needed for nausea or vomiting.    . senna-docusate (SENNA-PLUS) 8.6-50 MG tablet Take 1 tablet by mouth 3 (three) times daily.    Marland Kitchen venlafaxine XR (EFFEXOR-XR) 150 MG 24 hr capsule Take 1 capsule (150 mg total) by mouth daily with breakfast. 90 capsule 3  . warfarin (COUMADIN) 6 MG tablet Take 6 mg by mouth daily.     No current facility-administered medications on file prior to visit.    Review of Systems:  As per HPI- otherwise negative.   Physical Examination: Vitals:   11/03/19 1329  BP: 124/80  Pulse: 84   Resp: 15  SpO2: 97%   Vitals:   11/03/19 1329  Weight: 185 lb (83.9 kg)  Height: _0  (1.676 m)   Body mass index is 29.86 kg/m. Ideal Body Weight: Weight in (lb) to have BMI = 25: 154.6  GEN: no acute distress.  Overweight, seated in wheelchair.  Accompanied by her daughter HEENT: Atraumatic, Normocephalic.   Some dry skin on face and ear canals today, otherwise normal TM within normal limits bilaterally. PEERL Ears and Nose: No external deformity. CV: RRR, No M/G/R. No JVD. No thrill. No extra heart sounds. PULM: CTA B, no wheezes, crackles, rhonchi. No retractions. No resp. distress. No accessory muscle use. ABD: S, NT, ND. No rebound. No HSM. EXTR: No c/c/e PSYCH: Normally interactive. Conversant.  Patient is alert and interactive, she does defer to her daughter for answers to more complex questions   Assessment and Plan: Memory loss - Plan: TSH  Encounter for screening mammogram for malignant neoplasm of breast - Plan: MM 3D SCREEN BREAST BILATERAL  Parkinson disease ([place])  Essential hypertension - Plan: CBC, Comprehensive metabolic panel, TSH  Mixed hyperlipidemia - Plan: Lipid panel  Pre-diabetes - Plan: Hemoglobin A1c  Patient here today for a follow-up visit History of memory loss and Parkinson disease.  She is followed by neurology, will check a TSH for her today Also needs a lipid panel A1c, CBC, c-Met Discussed immunizations and timing of COVID-19 booster Patient chooses to stop colon cancer screen at this time which is appropriate Order mammogram  Will plan further follow- up pending labs. Plan to visit in 6 months assuming all is well This visit occurred during the SARS-CoV-2 public health emergency.  Safety protocols were in place, including screening questions prior to the visit, additional usage of staff PPE, and extensive cleaning of exam room while observing appropriate contact time as indicated for disinfecting solutions.    Signed Janett Billow  Ellena Kamen, MD  Received her labs as below 8/31- message to pt  Results for orders placed or performed in visit on 11/03/19  CBC  Result Value Ref Range   WBC 8.8 3.8 - 10.8 Thousand/uL   RBC 5.04 3.80 - 5.10 Million/uL   Hemoglobin 14.3 11.7 - 15.5 g/dL   HCT 43.2 35 - 45 %   MCV 85.7 80.0 - 100.0 fL   MCH 28.4 27.0 - 33.0 pg   MCHC 33.1 32.0 - 36.0 g/dL   RDW 12.6 11.0 - 15.0 %   Platelets 340 140 - 400 Thousand/uL   MPV 9.8 7.5 - 12.5 fL  Comprehensive metabolic panel  Result Value Ref Range   Glucose, Bld 191 (H) 65 - 99 mg/dL   BUN 10 7 - 25 mg/dL   Creat 0.63 0.60 - 0.93 mg/dL   BUN/Creatinine Ratio NOT APPLICABLE 6 - 22 (calc)   Sodium 139 135 - 146 mmol/L   Potassium 4.3 3.5 - 5.3 mmol/L   Chloride 101 98 - 110 mmol/L   CO2 27 20 - 32 mmol/L   Calcium 9.4 8.6 - 10.4 mg/dL   Total Protein 6.5 6.1 - 8.1 g/dL   Albumin 3.9 3.6 - 5.1 g/dL   Globulin 2.6 1.9 - 3.7 g/dL (calc)   AG Ratio 1.5 1.0 - 2.5 (calc)   Total Bilirubin 0.3 0.2 - 1.2 mg/dL   Alkaline phosphatase (APISO) 207 (H) 37 - 153 U/L   AST 13 10 - 35 U/L   ALT 5 (L) 6 - 29 U/L  Lipid panel  Result Value Ref Range   Cholesterol 183 <200 mg/dL   HDL 53 > OR = 50 mg/dL   Triglycerides 232 (H) <150 mg/dL   LDL Cholesterol (Calc) 95 mg/dL (calc)   Total CHOL/HDL Ratio 3.5 <5.0 (calc)   Non-HDL Cholesterol (Calc) 130 (H) <130 mg/dL (calc)  TSH  Result Value Ref Range   TSH 1.22 0.40 - 4.50 mIU/L  Hemoglobin A1c  Result Value Ref Range   Hgb A1c MFr Bld 6.1 (H) <5.7 % of total Hgb   Mean Plasma Glucose 128 (calc)   eAG (mmol/L) 7.1 (calc)

## 2019-11-03 ENCOUNTER — Ambulatory Visit (HOSPITAL_BASED_OUTPATIENT_CLINIC_OR_DEPARTMENT_OTHER)
Admission: RE | Admit: 2019-11-03 | Discharge: 2019-11-03 | Disposition: A | Discharge status: Home / Self Care | Payer: Medicare Other | Source: Ambulatory Visit | Attending: Family Medicine | Admitting: Family Medicine

## 2019-11-03 ENCOUNTER — Encounter: Payer: Self-pay | Admitting: Family Medicine

## 2019-11-03 ENCOUNTER — Encounter (HOSPITAL_BASED_OUTPATIENT_CLINIC_OR_DEPARTMENT_OTHER): Payer: Self-pay

## 2019-11-03 ENCOUNTER — Ambulatory Visit (INDEPENDENT_AMBULATORY_CARE_PROVIDER_SITE_OTHER): Payer: Medicare Other | Admitting: Family Medicine

## 2019-11-03 ENCOUNTER — Other Ambulatory Visit: Payer: Self-pay

## 2019-11-03 VITALS — BP 124/80 | HR 84 | Resp 15 | Ht 66.0 in | Wt 185.0 lb

## 2019-11-03 DIAGNOSIS — I10 Essential (primary) hypertension: Secondary | ICD-10-CM | POA: Diagnosis not present

## 2019-11-03 DIAGNOSIS — R413 Other amnesia: Secondary | ICD-10-CM | POA: Diagnosis not present

## 2019-11-03 DIAGNOSIS — E782 Mixed hyperlipidemia: Secondary | ICD-10-CM

## 2019-11-03 DIAGNOSIS — G20 Parkinson's disease: Secondary | ICD-10-CM

## 2019-11-03 DIAGNOSIS — R7303 Prediabetes: Secondary | ICD-10-CM | POA: Diagnosis not present

## 2019-11-03 DIAGNOSIS — Z1231 Encounter for screening mammogram for malignant neoplasm of breast: Secondary | ICD-10-CM

## 2019-11-03 DIAGNOSIS — Z86711 Personal history of pulmonary embolism: Secondary | ICD-10-CM | POA: Diagnosis not present

## 2019-11-03 DIAGNOSIS — G20A1 Parkinson's disease without dyskinesia, without mention of fluctuations: Secondary | ICD-10-CM

## 2019-11-04 ENCOUNTER — Encounter: Payer: Self-pay | Admitting: Family Medicine

## 2019-11-04 LAB — CBC
HCT: 43.2 % (ref 35.0–45.0)
Hemoglobin: 14.3 g/dL (ref 11.7–15.5)
MCH: 28.4 pg (ref 27.0–33.0)
MCHC: 33.1 g/dL (ref 32.0–36.0)
MCV: 85.7 fL (ref 80.0–100.0)
MPV: 9.8 fL (ref 7.5–12.5)
Platelets: 340 10*3/uL (ref 140–400)
RBC: 5.04 10*6/uL (ref 3.80–5.10)
RDW: 12.6 % (ref 11.0–15.0)
WBC: 8.8 10*3/uL (ref 3.8–10.8)

## 2019-11-04 LAB — COMPREHENSIVE METABOLIC PANEL
AG Ratio: 1.5 (calc) (ref 1.0–2.5)
ALT: 5 U/L — ABNORMAL LOW (ref 6–29)
AST: 13 U/L (ref 10–35)
Albumin: 3.9 g/dL (ref 3.6–5.1)
Alkaline phosphatase (APISO): 207 U/L — ABNORMAL HIGH (ref 37–153)
BUN: 10 mg/dL (ref 7–25)
CO2: 27 mmol/L (ref 20–32)
Calcium: 9.4 mg/dL (ref 8.6–10.4)
Chloride: 101 mmol/L (ref 98–110)
Creat: 0.63 mg/dL (ref 0.60–0.93)
Globulin: 2.6 g/dL (calc) (ref 1.9–3.7)
Glucose, Bld: 191 mg/dL — ABNORMAL HIGH (ref 65–99)
Potassium: 4.3 mmol/L (ref 3.5–5.3)
Sodium: 139 mmol/L (ref 135–146)
Total Bilirubin: 0.3 mg/dL (ref 0.2–1.2)
Total Protein: 6.5 g/dL (ref 6.1–8.1)

## 2019-11-04 LAB — TSH: TSH: 1.22 mIU/L (ref 0.40–4.50)

## 2019-11-04 LAB — LIPID PANEL
Cholesterol: 183 mg/dL (ref <200)
HDL: 53 mg/dL (ref >=50)
LDL Cholesterol (Calc): 95 mg/dL (calc)
Non-HDL Cholesterol (Calc): 130 mg/dL (calc) — ABNORMAL HIGH (ref <130)
Total CHOL/HDL Ratio: 3.5 (calc) (ref <5.0)
Triglycerides: 232 mg/dL — ABNORMAL HIGH (ref <150)

## 2019-11-04 LAB — HEMOGLOBIN A1C
Hgb A1c MFr Bld: 6.1 % of total Hgb — ABNORMAL HIGH (ref <5.7)
Mean Plasma Glucose: 128 (calc)
eAG (mmol/L): 7.1 (calc)

## 2019-11-11 DIAGNOSIS — Z86711 Personal history of pulmonary embolism: Secondary | ICD-10-CM | POA: Diagnosis not present

## 2019-11-18 DIAGNOSIS — Z7901 Long term (current) use of anticoagulants: Secondary | ICD-10-CM | POA: Diagnosis not present

## 2019-11-25 DIAGNOSIS — Z86711 Personal history of pulmonary embolism: Secondary | ICD-10-CM | POA: Diagnosis not present

## 2019-12-01 ENCOUNTER — Encounter: Payer: Medicare Other | Admitting: Psychology

## 2019-12-02 DIAGNOSIS — Z86711 Personal history of pulmonary embolism: Secondary | ICD-10-CM | POA: Diagnosis not present

## 2019-12-03 DIAGNOSIS — D6489 Other specified anemias: Secondary | ICD-10-CM | POA: Diagnosis not present

## 2019-12-03 DIAGNOSIS — F064 Anxiety disorder due to known physiological condition: Secondary | ICD-10-CM | POA: Diagnosis not present

## 2019-12-03 DIAGNOSIS — F320 Major depressive disorder, single episode, mild: Secondary | ICD-10-CM | POA: Diagnosis not present

## 2019-12-03 DIAGNOSIS — K5901 Slow transit constipation: Secondary | ICD-10-CM | POA: Diagnosis not present

## 2019-12-03 DIAGNOSIS — J45998 Other asthma: Secondary | ICD-10-CM | POA: Diagnosis not present

## 2019-12-03 DIAGNOSIS — N182 Chronic kidney disease, stage 2 (mild): Secondary | ICD-10-CM | POA: Diagnosis not present

## 2019-12-03 DIAGNOSIS — G20 Parkinson's disease: Secondary | ICD-10-CM | POA: Diagnosis not present

## 2019-12-03 DIAGNOSIS — E7849 Other hyperlipidemia: Secondary | ICD-10-CM | POA: Diagnosis not present

## 2019-12-03 DIAGNOSIS — E038 Other specified hypothyroidism: Secondary | ICD-10-CM | POA: Diagnosis not present

## 2019-12-03 DIAGNOSIS — I10 Essential (primary) hypertension: Secondary | ICD-10-CM | POA: Diagnosis not present

## 2019-12-08 ENCOUNTER — Encounter: Payer: Medicare Other | Admitting: Psychology

## 2019-12-09 DIAGNOSIS — Z86711 Personal history of pulmonary embolism: Secondary | ICD-10-CM | POA: Diagnosis not present

## 2019-12-09 DIAGNOSIS — Z7901 Long term (current) use of anticoagulants: Secondary | ICD-10-CM | POA: Diagnosis not present

## 2019-12-10 DIAGNOSIS — Z7901 Long term (current) use of anticoagulants: Secondary | ICD-10-CM | POA: Diagnosis not present

## 2019-12-12 DIAGNOSIS — R071 Chest pain on breathing: Secondary | ICD-10-CM | POA: Diagnosis not present

## 2019-12-15 DIAGNOSIS — M5136 Other intervertebral disc degeneration, lumbar region: Secondary | ICD-10-CM | POA: Diagnosis not present

## 2019-12-16 DIAGNOSIS — W19XXXA Unspecified fall, initial encounter: Secondary | ICD-10-CM | POA: Diagnosis not present

## 2019-12-16 DIAGNOSIS — R5381 Other malaise: Secondary | ICD-10-CM | POA: Diagnosis not present

## 2019-12-16 DIAGNOSIS — Z7901 Long term (current) use of anticoagulants: Secondary | ICD-10-CM | POA: Diagnosis not present

## 2019-12-16 DIAGNOSIS — Z86711 Personal history of pulmonary embolism: Secondary | ICD-10-CM | POA: Diagnosis not present

## 2019-12-19 DIAGNOSIS — Z7982 Long term (current) use of aspirin: Secondary | ICD-10-CM | POA: Diagnosis not present

## 2019-12-19 DIAGNOSIS — E039 Hypothyroidism, unspecified: Secondary | ICD-10-CM | POA: Diagnosis not present

## 2019-12-19 DIAGNOSIS — Z87891 Personal history of nicotine dependence: Secondary | ICD-10-CM | POA: Diagnosis not present

## 2019-12-19 DIAGNOSIS — Z9181 History of falling: Secondary | ICD-10-CM | POA: Diagnosis not present

## 2019-12-19 DIAGNOSIS — Z96659 Presence of unspecified artificial knee joint: Secondary | ICD-10-CM | POA: Diagnosis not present

## 2019-12-19 DIAGNOSIS — J45909 Unspecified asthma, uncomplicated: Secondary | ICD-10-CM | POA: Diagnosis not present

## 2019-12-19 DIAGNOSIS — Z86711 Personal history of pulmonary embolism: Secondary | ICD-10-CM | POA: Diagnosis not present

## 2019-12-19 DIAGNOSIS — H9190 Unspecified hearing loss, unspecified ear: Secondary | ICD-10-CM | POA: Diagnosis not present

## 2019-12-19 DIAGNOSIS — Z8616 Personal history of COVID-19: Secondary | ICD-10-CM | POA: Diagnosis not present

## 2019-12-19 DIAGNOSIS — K5901 Slow transit constipation: Secondary | ICD-10-CM | POA: Diagnosis not present

## 2019-12-19 DIAGNOSIS — E785 Hyperlipidemia, unspecified: Secondary | ICD-10-CM | POA: Diagnosis not present

## 2019-12-19 DIAGNOSIS — I10 Essential (primary) hypertension: Secondary | ICD-10-CM | POA: Diagnosis not present

## 2019-12-19 DIAGNOSIS — R4182 Altered mental status, unspecified: Secondary | ICD-10-CM | POA: Diagnosis not present

## 2019-12-19 DIAGNOSIS — Z87440 Personal history of urinary (tract) infections: Secondary | ICD-10-CM | POA: Diagnosis not present

## 2019-12-19 DIAGNOSIS — G20 Parkinson's disease: Secondary | ICD-10-CM | POA: Diagnosis not present

## 2019-12-19 DIAGNOSIS — F32A Depression, unspecified: Secondary | ICD-10-CM | POA: Diagnosis not present

## 2019-12-19 DIAGNOSIS — G243 Spasmodic torticollis: Secondary | ICD-10-CM | POA: Diagnosis not present

## 2019-12-19 DIAGNOSIS — Z7901 Long term (current) use of anticoagulants: Secondary | ICD-10-CM | POA: Diagnosis not present

## 2019-12-23 DIAGNOSIS — Z86711 Personal history of pulmonary embolism: Secondary | ICD-10-CM | POA: Diagnosis not present

## 2019-12-25 DIAGNOSIS — G243 Spasmodic torticollis: Secondary | ICD-10-CM | POA: Diagnosis not present

## 2019-12-25 DIAGNOSIS — E039 Hypothyroidism, unspecified: Secondary | ICD-10-CM | POA: Diagnosis not present

## 2019-12-25 DIAGNOSIS — R4182 Altered mental status, unspecified: Secondary | ICD-10-CM | POA: Diagnosis not present

## 2019-12-25 DIAGNOSIS — Z23 Encounter for immunization: Secondary | ICD-10-CM | POA: Diagnosis not present

## 2019-12-25 DIAGNOSIS — I10 Essential (primary) hypertension: Secondary | ICD-10-CM | POA: Diagnosis not present

## 2019-12-25 DIAGNOSIS — G20 Parkinson's disease: Secondary | ICD-10-CM | POA: Diagnosis not present

## 2019-12-25 DIAGNOSIS — J45909 Unspecified asthma, uncomplicated: Secondary | ICD-10-CM | POA: Diagnosis not present

## 2019-12-26 DIAGNOSIS — R4182 Altered mental status, unspecified: Secondary | ICD-10-CM | POA: Diagnosis not present

## 2019-12-26 DIAGNOSIS — E039 Hypothyroidism, unspecified: Secondary | ICD-10-CM | POA: Diagnosis not present

## 2019-12-26 DIAGNOSIS — J45909 Unspecified asthma, uncomplicated: Secondary | ICD-10-CM | POA: Diagnosis not present

## 2019-12-26 DIAGNOSIS — G20 Parkinson's disease: Secondary | ICD-10-CM | POA: Diagnosis not present

## 2019-12-26 DIAGNOSIS — I10 Essential (primary) hypertension: Secondary | ICD-10-CM | POA: Diagnosis not present

## 2019-12-26 DIAGNOSIS — G243 Spasmodic torticollis: Secondary | ICD-10-CM | POA: Diagnosis not present

## 2019-12-30 DIAGNOSIS — R4182 Altered mental status, unspecified: Secondary | ICD-10-CM | POA: Diagnosis not present

## 2019-12-30 DIAGNOSIS — G243 Spasmodic torticollis: Secondary | ICD-10-CM | POA: Diagnosis not present

## 2019-12-30 DIAGNOSIS — G20 Parkinson's disease: Secondary | ICD-10-CM | POA: Diagnosis not present

## 2019-12-30 DIAGNOSIS — I10 Essential (primary) hypertension: Secondary | ICD-10-CM | POA: Diagnosis not present

## 2019-12-30 DIAGNOSIS — E039 Hypothyroidism, unspecified: Secondary | ICD-10-CM | POA: Diagnosis not present

## 2019-12-30 DIAGNOSIS — Z86711 Personal history of pulmonary embolism: Secondary | ICD-10-CM | POA: Diagnosis not present

## 2019-12-30 DIAGNOSIS — J45909 Unspecified asthma, uncomplicated: Secondary | ICD-10-CM | POA: Diagnosis not present

## 2020-01-02 DIAGNOSIS — K5901 Slow transit constipation: Secondary | ICD-10-CM | POA: Diagnosis not present

## 2020-01-02 DIAGNOSIS — I10 Essential (primary) hypertension: Secondary | ICD-10-CM | POA: Diagnosis not present

## 2020-01-02 DIAGNOSIS — J45998 Other asthma: Secondary | ICD-10-CM | POA: Diagnosis not present

## 2020-01-02 DIAGNOSIS — R4182 Altered mental status, unspecified: Secondary | ICD-10-CM | POA: Diagnosis not present

## 2020-01-02 DIAGNOSIS — N182 Chronic kidney disease, stage 2 (mild): Secondary | ICD-10-CM | POA: Diagnosis not present

## 2020-01-02 DIAGNOSIS — M25571 Pain in right ankle and joints of right foot: Secondary | ICD-10-CM | POA: Diagnosis not present

## 2020-01-02 DIAGNOSIS — F064 Anxiety disorder due to known physiological condition: Secondary | ICD-10-CM | POA: Diagnosis not present

## 2020-01-02 DIAGNOSIS — E039 Hypothyroidism, unspecified: Secondary | ICD-10-CM | POA: Diagnosis not present

## 2020-01-02 DIAGNOSIS — J45909 Unspecified asthma, uncomplicated: Secondary | ICD-10-CM | POA: Diagnosis not present

## 2020-01-02 DIAGNOSIS — E785 Hyperlipidemia, unspecified: Secondary | ICD-10-CM | POA: Diagnosis not present

## 2020-01-02 DIAGNOSIS — G243 Spasmodic torticollis: Secondary | ICD-10-CM | POA: Diagnosis not present

## 2020-01-02 DIAGNOSIS — G20 Parkinson's disease: Secondary | ICD-10-CM | POA: Diagnosis not present

## 2020-01-02 DIAGNOSIS — F329 Major depressive disorder, single episode, unspecified: Secondary | ICD-10-CM | POA: Diagnosis not present

## 2020-01-02 DIAGNOSIS — D649 Anemia, unspecified: Secondary | ICD-10-CM | POA: Diagnosis not present

## 2020-01-06 DIAGNOSIS — Z86711 Personal history of pulmonary embolism: Secondary | ICD-10-CM | POA: Diagnosis not present

## 2020-01-07 DIAGNOSIS — G243 Spasmodic torticollis: Secondary | ICD-10-CM | POA: Diagnosis not present

## 2020-01-07 DIAGNOSIS — M25471 Effusion, right ankle: Secondary | ICD-10-CM | POA: Diagnosis not present

## 2020-01-07 DIAGNOSIS — M25571 Pain in right ankle and joints of right foot: Secondary | ICD-10-CM | POA: Diagnosis not present

## 2020-01-07 DIAGNOSIS — E039 Hypothyroidism, unspecified: Secondary | ICD-10-CM | POA: Diagnosis not present

## 2020-01-07 DIAGNOSIS — J45909 Unspecified asthma, uncomplicated: Secondary | ICD-10-CM | POA: Diagnosis not present

## 2020-01-07 DIAGNOSIS — I10 Essential (primary) hypertension: Secondary | ICD-10-CM | POA: Diagnosis not present

## 2020-01-07 DIAGNOSIS — R4182 Altered mental status, unspecified: Secondary | ICD-10-CM | POA: Diagnosis not present

## 2020-01-07 DIAGNOSIS — G20 Parkinson's disease: Secondary | ICD-10-CM | POA: Diagnosis not present

## 2020-01-09 DIAGNOSIS — I10 Essential (primary) hypertension: Secondary | ICD-10-CM | POA: Diagnosis not present

## 2020-01-09 DIAGNOSIS — J45909 Unspecified asthma, uncomplicated: Secondary | ICD-10-CM | POA: Diagnosis not present

## 2020-01-09 DIAGNOSIS — R4182 Altered mental status, unspecified: Secondary | ICD-10-CM | POA: Diagnosis not present

## 2020-01-09 DIAGNOSIS — G20 Parkinson's disease: Secondary | ICD-10-CM | POA: Diagnosis not present

## 2020-01-09 DIAGNOSIS — G243 Spasmodic torticollis: Secondary | ICD-10-CM | POA: Diagnosis not present

## 2020-01-09 DIAGNOSIS — E039 Hypothyroidism, unspecified: Secondary | ICD-10-CM | POA: Diagnosis not present

## 2020-01-12 ENCOUNTER — Encounter: Payer: Medicare Other | Admitting: Psychology

## 2020-01-13 DIAGNOSIS — Z86711 Personal history of pulmonary embolism: Secondary | ICD-10-CM | POA: Diagnosis not present

## 2020-01-13 DIAGNOSIS — I10 Essential (primary) hypertension: Secondary | ICD-10-CM | POA: Diagnosis not present

## 2020-01-13 DIAGNOSIS — G20 Parkinson's disease: Secondary | ICD-10-CM | POA: Diagnosis not present

## 2020-01-13 DIAGNOSIS — G243 Spasmodic torticollis: Secondary | ICD-10-CM | POA: Diagnosis not present

## 2020-01-13 DIAGNOSIS — J45909 Unspecified asthma, uncomplicated: Secondary | ICD-10-CM | POA: Diagnosis not present

## 2020-01-13 DIAGNOSIS — R4182 Altered mental status, unspecified: Secondary | ICD-10-CM | POA: Diagnosis not present

## 2020-01-13 DIAGNOSIS — E039 Hypothyroidism, unspecified: Secondary | ICD-10-CM | POA: Diagnosis not present

## 2020-01-18 DIAGNOSIS — Z87891 Personal history of nicotine dependence: Secondary | ICD-10-CM | POA: Diagnosis not present

## 2020-01-18 DIAGNOSIS — R4182 Altered mental status, unspecified: Secondary | ICD-10-CM | POA: Diagnosis not present

## 2020-01-18 DIAGNOSIS — Z7982 Long term (current) use of aspirin: Secondary | ICD-10-CM | POA: Diagnosis not present

## 2020-01-18 DIAGNOSIS — Z86711 Personal history of pulmonary embolism: Secondary | ICD-10-CM | POA: Diagnosis not present

## 2020-01-18 DIAGNOSIS — G243 Spasmodic torticollis: Secondary | ICD-10-CM | POA: Diagnosis not present

## 2020-01-18 DIAGNOSIS — H9190 Unspecified hearing loss, unspecified ear: Secondary | ICD-10-CM | POA: Diagnosis not present

## 2020-01-18 DIAGNOSIS — E039 Hypothyroidism, unspecified: Secondary | ICD-10-CM | POA: Diagnosis not present

## 2020-01-18 DIAGNOSIS — Z96659 Presence of unspecified artificial knee joint: Secondary | ICD-10-CM | POA: Diagnosis not present

## 2020-01-18 DIAGNOSIS — Z7901 Long term (current) use of anticoagulants: Secondary | ICD-10-CM | POA: Diagnosis not present

## 2020-01-18 DIAGNOSIS — K5901 Slow transit constipation: Secondary | ICD-10-CM | POA: Diagnosis not present

## 2020-01-18 DIAGNOSIS — Z87440 Personal history of urinary (tract) infections: Secondary | ICD-10-CM | POA: Diagnosis not present

## 2020-01-18 DIAGNOSIS — G20 Parkinson's disease: Secondary | ICD-10-CM | POA: Diagnosis not present

## 2020-01-18 DIAGNOSIS — F32A Depression, unspecified: Secondary | ICD-10-CM | POA: Diagnosis not present

## 2020-01-18 DIAGNOSIS — Z9181 History of falling: Secondary | ICD-10-CM | POA: Diagnosis not present

## 2020-01-18 DIAGNOSIS — J45909 Unspecified asthma, uncomplicated: Secondary | ICD-10-CM | POA: Diagnosis not present

## 2020-01-18 DIAGNOSIS — Z8616 Personal history of COVID-19: Secondary | ICD-10-CM | POA: Diagnosis not present

## 2020-01-18 DIAGNOSIS — E785 Hyperlipidemia, unspecified: Secondary | ICD-10-CM | POA: Diagnosis not present

## 2020-01-18 DIAGNOSIS — I10 Essential (primary) hypertension: Secondary | ICD-10-CM | POA: Diagnosis not present

## 2020-01-19 ENCOUNTER — Encounter: Payer: Medicare Other | Admitting: Psychology

## 2020-01-20 DIAGNOSIS — Z86711 Personal history of pulmonary embolism: Secondary | ICD-10-CM | POA: Diagnosis not present

## 2020-01-21 DIAGNOSIS — Z7901 Long term (current) use of anticoagulants: Secondary | ICD-10-CM | POA: Diagnosis not present

## 2020-01-23 DIAGNOSIS — R4182 Altered mental status, unspecified: Secondary | ICD-10-CM | POA: Diagnosis not present

## 2020-01-23 DIAGNOSIS — Z7901 Long term (current) use of anticoagulants: Secondary | ICD-10-CM | POA: Diagnosis not present

## 2020-01-23 DIAGNOSIS — E039 Hypothyroidism, unspecified: Secondary | ICD-10-CM | POA: Diagnosis not present

## 2020-01-23 DIAGNOSIS — G243 Spasmodic torticollis: Secondary | ICD-10-CM | POA: Diagnosis not present

## 2020-01-23 DIAGNOSIS — I10 Essential (primary) hypertension: Secondary | ICD-10-CM | POA: Diagnosis not present

## 2020-01-23 DIAGNOSIS — G20 Parkinson's disease: Secondary | ICD-10-CM | POA: Diagnosis not present

## 2020-01-23 DIAGNOSIS — J45909 Unspecified asthma, uncomplicated: Secondary | ICD-10-CM | POA: Diagnosis not present

## 2020-01-27 DIAGNOSIS — Z86711 Personal history of pulmonary embolism: Secondary | ICD-10-CM | POA: Diagnosis not present

## 2020-01-28 DIAGNOSIS — J45909 Unspecified asthma, uncomplicated: Secondary | ICD-10-CM | POA: Diagnosis not present

## 2020-01-28 DIAGNOSIS — R4182 Altered mental status, unspecified: Secondary | ICD-10-CM | POA: Diagnosis not present

## 2020-01-28 DIAGNOSIS — G20 Parkinson's disease: Secondary | ICD-10-CM | POA: Diagnosis not present

## 2020-01-28 DIAGNOSIS — G243 Spasmodic torticollis: Secondary | ICD-10-CM | POA: Diagnosis not present

## 2020-01-28 DIAGNOSIS — E039 Hypothyroidism, unspecified: Secondary | ICD-10-CM | POA: Diagnosis not present

## 2020-01-28 DIAGNOSIS — I10 Essential (primary) hypertension: Secondary | ICD-10-CM | POA: Diagnosis not present

## 2020-02-03 DIAGNOSIS — F3289 Other specified depressive episodes: Secondary | ICD-10-CM | POA: Diagnosis not present

## 2020-02-03 DIAGNOSIS — J45998 Other asthma: Secondary | ICD-10-CM | POA: Diagnosis not present

## 2020-02-03 DIAGNOSIS — G20 Parkinson's disease: Secondary | ICD-10-CM | POA: Diagnosis not present

## 2020-02-03 DIAGNOSIS — D6489 Other specified anemias: Secondary | ICD-10-CM | POA: Diagnosis not present

## 2020-02-03 DIAGNOSIS — Z86711 Personal history of pulmonary embolism: Secondary | ICD-10-CM | POA: Diagnosis not present

## 2020-02-03 DIAGNOSIS — N182 Chronic kidney disease, stage 2 (mild): Secondary | ICD-10-CM | POA: Diagnosis not present

## 2020-02-03 DIAGNOSIS — I10 Essential (primary) hypertension: Secondary | ICD-10-CM | POA: Diagnosis not present

## 2020-02-03 DIAGNOSIS — E7849 Other hyperlipidemia: Secondary | ICD-10-CM | POA: Diagnosis not present

## 2020-02-03 DIAGNOSIS — F064 Anxiety disorder due to known physiological condition: Secondary | ICD-10-CM | POA: Diagnosis not present

## 2020-02-03 DIAGNOSIS — K5901 Slow transit constipation: Secondary | ICD-10-CM | POA: Diagnosis not present

## 2020-02-03 DIAGNOSIS — E038 Other specified hypothyroidism: Secondary | ICD-10-CM | POA: Diagnosis not present

## 2020-02-05 ENCOUNTER — Telehealth: Payer: Self-pay | Admitting: Family Medicine

## 2020-02-05 NOTE — Progress Notes (Signed)
  Chronic Care Management   Outreach Note  02/05/2020 Name: [name] [name] MRN: [id number] DOB: 1946-02-11  Referred by: Darreld Mclean, MD Reason for referral : No chief complaint on file.   An unsuccessful telephone outreach was attempted today. The patient was referred to the pharmacist for assistance with care management and care coordination.   Follow Up Plan:   [name] [employer] Scheduler

## 2020-02-10 DIAGNOSIS — Z86711 Personal history of pulmonary embolism: Secondary | ICD-10-CM | POA: Diagnosis not present

## 2020-02-17 DIAGNOSIS — Z86711 Personal history of pulmonary embolism: Secondary | ICD-10-CM | POA: Diagnosis not present

## 2020-02-19 ENCOUNTER — Telehealth: Payer: Self-pay | Admitting: Family Medicine

## 2020-02-19 NOTE — Progress Notes (Signed)
  Chronic Care Management   Outreach Note  02/19/2020 Name: [name] [name] MRN: [id number] DOB: 1945-11-01  Referred by: Darreld Mclean, MD Reason for referral : No chief complaint on file.   A second unsuccessful telephone outreach was attempted today. The patient was referred to pharmacist for assistance with care management and care coordination.  Follow Up Plan:   [name] [employer] Scheduler

## 2020-02-23 ENCOUNTER — Other Ambulatory Visit: Payer: Self-pay

## 2020-02-23 ENCOUNTER — Ambulatory Visit: Payer: Medicare Other | Admitting: Psychology

## 2020-02-23 ENCOUNTER — Encounter: Payer: Self-pay | Admitting: Psychology

## 2020-02-23 ENCOUNTER — Ambulatory Visit (INDEPENDENT_AMBULATORY_CARE_PROVIDER_SITE_OTHER): Payer: Medicare Other | Admitting: Psychology

## 2020-02-23 DIAGNOSIS — F0390 Unspecified dementia without behavioral disturbance: Secondary | ICD-10-CM

## 2020-02-23 DIAGNOSIS — R4189 Other symptoms and signs involving cognitive functions and awareness: Secondary | ICD-10-CM

## 2020-02-23 DIAGNOSIS — G20 Parkinson's disease: Secondary | ICD-10-CM

## 2020-02-23 HISTORY — DX: Unspecified dementia, unspecified severity, without behavioral disturbance, psychotic disturbance, mood disturbance, and anxiety: F03.90

## 2020-02-23 NOTE — Progress Notes (Signed)
   Psychometrician Note   Cognitive testing was administered to [name] [name] by Milana Kidney, B.S. (psychometrist) under the supervision of Dr. Christia Reading, Ph.D., licensed psychologist on 02/23/20. [name] [name] did not appear overtly distressed by the testing session per behavioral observation or responses across self-report questionnaires. Dr. Christia Reading, Ph.D. checked in with [name] [name] as needed to manage any distress related to testing procedures (if applicable). Rest breaks were offered.    The battery of tests administered was selected by Dr. Christia Reading, Ph.D. with consideration to [name] [name] current level of functioning, the nature of her symptoms, emotional and behavioral responses during interview, level of literacy, observed level of motivation/effort, and the nature of the referral question. This battery was communicated to the psychometrist. Communication between Dr. Christia Reading, Ph.D. and the psychometrist was ongoing throughout the evaluation and Dr. Christia Reading, Ph.D. was immediately accessible at all times. Dr. Christia Reading, Ph.D. provided supervision to the psychometrist on the date of this service to the extent necessary to assure the quality of all services provided.    [name] [name] will return within approximately 1-2 weeks for an interactive feedback session with Dr. Melvyn Novas at which time her test performances, clinical impressions, and treatment recommendations will be reviewed in detail. [name] [name] understands she can contact our office should she require our assistance before this time.  A total of 125 minutes of billable time were spent face-to-face with [name] [name] by the psychometrist. This includes both test administration and scoring time. Billing for these services is reflected in the clinical report generated by Dr. Christia Reading, Ph.D..  This note reflects time spent with the psychometrician and does not include test scores or any clinical  interpretations made by Dr. Melvyn Novas. The full report will follow in a separate note.

## 2020-02-23 NOTE — Progress Notes (Signed)
NEUROPSYCHOLOGICAL EVALUATION [place]. [place] Department of Neurology  Date of Evaluation: February 23, 2020  Reason for Referral:   Antonisha Waskey is a 74 y.o. right-handed Caucasian female referred by Alonza Bogus, D.O., to characterize her current cognitive functioning and assist with diagnostic clarity and treatment planning in the context of subjective cognitive decline, history of atypical Parkinson's disease, and frequent falling behaviors.   Assessment and Plan:   Clinical Impression(s): Ms. Stauch pattern of performance is suggestive of diffuse cognitive impairment, often in the exceptionally low to well below average normative ranges relative to age-matched peers. Basic attention was intact and represented her lone strength. She also performed in the below average range across a task assessing safety and judgment. While she did exhibit a relative strength across memory tasks, performances (well below average to below average) were still below expectation and representative of cognitive impairment. Significant deficits were seen across processing speed, complex attention, executive functioning, receptive and expressive language, and visuospatial abilities. While Ms. Criswell denied cognitive concerns or non-physical functional limitations, she currently resides in an assisted living facility and her daughter noted her mother having significant trouble completing instrumental activities of daily living (ADLs) independently. This, coupled with evidence for significant cognitive dysfunction described above, suggests that she meets criteria for a Major Neurocognitive Disorder ("dementia") at the present time.   Relative to her previous evaluation, performance declines were seen across every test and assessed cognitive domain. This was true even of basic attention, which represented her lone strength across the current evaluation. Given the widespread degree of  cognitive decline, the presence of a neurodegenerative illness seems extremely likely. However, identifying that illness remains challenging. The primary neurological conditions which result in significant balance instability and frequent falls include progressive supranuclear palsy (PSP), hydrocephalus, and multisystem atrophy (MSA), particularly the olivopontocerebellar subtype. Regarding the former, Ms. Eland does display characteristics of this condition, namely early on executive dysfunction (based upon her initial neuropsychological evaluation) which has progressed towards widespread dysfunction, as well as diminished facial expression, minimal to no blinking behaviors, and frequent falls in which she most often falls backwards. However, I did not witness characteristic gaze palsy. Specific to hydrocephalus, prior neuroimaging suggested ventriculomegaly and this condition, if left untreated, can certainly progress towards dementia. However, there is no report of incontinence or personality changes which are common characteristics of this condition. MSA, while rare, should also remain on her differential. Dr. Si Raider expressed previous concerns regarding this condition and recurring UTIs could be a relevant symptom. Her pattern of cognitive deficits would be consistent with these three conditions which generally have a nonspecific frontal-subcortical pattern across testing. Alzheimer's disease appears unlikely at the present time. While I cannot rule out something like Lewy body dementia, prior hallucinations seem related to medication side effects and the extent of her balance instability appears more pronounced than what is typically seen. Continued medical monitoring will be important moving forward.   Recommendations: I do not have record of Ms. Delorey completing a brain MRI. Unless there is a medical reason for her to not do so (e.g., pacemaker or significant claustrophobia), I would recommend that  she be referred for this form of imaging. In particular, it would be helpful to purposefully look for anatomical signs specific to PSP and MSA, rather than just assessing small vessel ischemic changes and the extent of atrophy.  Prior CT scans have suggested the presence of ventriculomegaly. If hydrocephalus is strongly considered by her medical team, Ms. Falkner could  discuss the pros and cons of undergoing a lumbar puncture with her neurologist to obtain further diagnostic clarity.   Based upon current test findings, I feel that Ms. Hannis current placement in assisted living is quite appropriate. She would likely have significant difficulties managing medications and personal finances independently. I agree with her family's decision to have her abstain from all driving pursuits. I would strongly encourage her to utilize her wheelchair in all instances to limit the risk of future falls.  It will be important for Ms. Grauberger to have another person with her when in situations where she may need to process information, weigh the pros and cons of different options, and make decisions, in order to ensure that she fully understands and recalls all information to be considered.  If not already done, Ms. Franko and her family may want to discuss her wishes regarding durable power of attorney and medical decision making, so that she can have input into these choices. Additionally, they may wish to discuss future plans for caretaking and seek out community options for in home/residential care should they become necessary.  When learning new information, she would benefit from information being broken up into small, manageable pieces. However, given the extent of other cognitive deficits, it would be wise to present all information important to remember in written format. This should be placed in a highly visible and commonly frequented location in her residence to promote recall.  Ms. Lauro had difficulty  with receptive language, particularly when presented in complex sentence structures. Presenting information it in short, concrete sentences will be helpful to ensure her comprehension.  To address problems with executive dysfunction, she may wish to consider:   -Avoiding external distractions when needing to concentrate   -Limiting exposure to fast paced environments with multiple sensory demands   -Writing down complicated information and using checklists   -Attempting and completing one task at a time (i.e., no multi-tasking)   -Verbalizing aloud each step of a task to maintain focus   -Taking frequent breaks during the completion of steps/tasks to avoid fatigue   -Reducing the amount of information considered at one time  Review of Records:   Ms. Lanese completed a comprehensive neuropsychological evaluation Tarry Kos, Psy.D.) on 09/07/2016. Per background notes from this evaluation, she previously saw her neurologist on11/21/2017; performance on a brief cognitive screening instrument ([place]) was 23/30. Ms. Edgecombe had fallen on 12/17/2015 when shegot up in the middle of the night to use the bathroom. She reportedly lost her balance and fell through sheet rock head-first. She denied loss in consciousness but did sustain a moderately displaced fracture involving the posterior spinous process of C5. She was hospitalized from10/13-10/16 and in inpatient rehabilitation from10/16-10/27. Prior to this fall, Ms. Heikkila was managing all instrumental ADLs and was driving even though she was told not to due to vision impairment. She reported cognitive symptoms immediately following this injury but felt she was able to return to her cognitive baseline when meeting with Dr. Si Raider. Ms. Quigley daughter [name] did note that mild cognitive issues were present prior to the fall. Old medication bottles with lots of medications in them were found when the family went in her home while she was in the  hospital; it appeared that she was not taking her medications as prescribed and did not have a good system in place.   She experienced pneumonia twice during 2018 and also was hospitalized from 04/29/2016-05/03/2016 for sepsis due to E Coli UTI. She moved to  [facility] (no longer [facility]) and reported a positive experience. She denied cognitive concerns when meeting with Dr. Si Raider, noting that she had cognitive testing done in March when she was in [facility] and was told she is "as sharp as a tack". Since seeing her neurologist on 07/12/2016, Ms. Marquina reported only one fall when she was trying to roll out of bed and caught the night stand. She did not hit the floor, nor her head. She reported faithfully using her walker at that time. She was not driving but was managing her medications, finances, and appointments independently with no reported problems.  Results of the neuropsychological evaluation were largely within normal limits. She demonstrated intact attention, processing speed, language, visual spatial construction and both verbal and nonverbal memory. She did demonstrate a relative weakness in executive functioning. Mental flexibility was impaired and phonemic verbal fluency was below expectation. Based on these test results, she did not meet criteria for a dementia syndrome. However, a diagnosis of mild cognitive impairment (single domain) was said to be warranted. It was said to be possible that executive dysfunction was related to her history of head injury. However, it was also noted that her cognitive profile (i.e. mild executive dysfunction with intact memory, no dementia) is very common in multisystem atrophy. Evidence for Alzheimer's disease was not suggested.  Ms. Roseman was seen by [facility] Wells Guiles Tat, D.O.) on 05/06/2019 and carries a diagnosis of an atypical parkinsonism state. Dr. [name] noted that Ms. Desantiago had not been seen since 2019 (with  the exception of for Botox) and has no showed and canceled multiple appointments. Per medical records, she had multiple hospitalizations since her last appointment. She was in the hospital in November for complicated urinary tract infection, as well as January 2020 due to multiple falls. She returned to the hospital in January 2021 due to increasing weakness and failure to thrive. Her INR was subtherapeutic on admission and chest x-ray on admission showed multifocal infiltrates. Ms. Vary was discharged to subacute nursing facility. Her daughter noted concerns surrounding memory dysfunction and moved Ms. Maris to an assisted living facility as there were concerns that she was not eating or taking her medications properly. Her son noted that a Education officer, museum actually got involved with that move because of the conditions in which she was living. Ms. Sprick stated that meals are provided to her, medications are distributed to her, and someone helps her dress. She manages the remainder of the day on her own. Ms. Manseau denied memory loss but did admit that family members tell her that her memory is poor. She additionally acknowledged ongoing visual hallucinations (i.e., she sees a dog) which happen daily. When meeting with Dr. [name] on 10/14/2019, Dr. [name] noted that Ms. Cassell had another fall and broke her clavicle in July; there are reports that she has been getting up without locking her wheelchair or asking for help. Swallowing difficulties were not reported and her daughter noted frequent UTIs. Dr. [name] decreased carbidopa/levodopa dosage. Ultimately, Ms. Laskowski was referred for a repeat comprehensive neuropsychological evaluation to characterize her cognitive abilities and to assist with diagnostic clarity and treatment planning.   Head CT on 10/13/2017 revealed advanced chronic microvascular disease and generalized brain atrophy. Head CT on 02/11/2018 revealed stable ventriculomegaly with image findings of  chronic communicating hydrocephalus, as well as moderate chronic small vessel ischemic changes. Head CT on 01/03/2019 in the context of multiple falls revealed stable ventriculomegaly and moderate chronic small vessel ischemic changes  but no acute intracranial abnormality.   Past Medical History:  Diagnosis Date  . Acute metabolic encephalopathy 23/30/0762  . Adenoma of right adrenal gland 02/20/2018  . AKI (acute kidney injury) 02/12/2017  . Arthritis   . Atypical parkinsonism state 05/17/2015  . Benign essential hypertension   . Complication of anesthesia    severe itching night of surgery requiring meds- also couldnt swallow- throat "paralyzed""  . Displaced fracture of fifth cervical vertebra 12/17/2015  . Fibromyalgia   . Gait disturbance 12/17/2015  . Glaucoma 10/13/2017  . Hematuria 04/05/2016  . History of prediabetes 10/13/2017  . History of pulmonary embolus (PE) 2/2 hypercoagulable state 11/23/2016  . History of sepsis 09/17/2016  . History of TIA (transient ischemic attack) 10/13/2017  . Hypercholesterolemia   . Hyperlipidemia   . Hyponatremia 01/03/2019  . Hypothyroidism    s/p graves disease  . Kidney stone   . Left ventricular diastolic dysfunction 26/33/3545  . Osteopenia 12/18/2017  . Peripheral vascular disease    PULMONARY EMBOLUS x 2- 2011, 2012/ FOLLOWED BY DR [id number] 12/12 EPIC   PT STAES WILL STOP COUMADIN 3/12 and begin LOVONOX 05/17/11 as previously instructed  . Pulmonary hypertension 11/23/2016  . Recurrent UTI 11/23/2016  . S/P right TKA 05/22/2011  . Sinus infection   . SIRS (systemic inflammatory response syndrome) 04/29/2016  . Subtherapeutic international normalized ratio (INR) 01/03/2019  . Trochanteric bursitis of both hips 09/13/2015    Past Surgical History:  Procedure Laterality Date  . ABDOMINAL HYSTERECTOMY    . APPENDECTOMY    . CATARACT EXTRACTION Bilateral   . CHOLECYSTECTOMY    . COLONOSCOPY    . EXPLORATORY LAPAROTOMY    .  JOINT REPLACEMENT     left knee  7/12  . TOTAL KNEE ARTHROPLASTY  05/22/2011   Procedure: TOTAL KNEE ARTHROPLASTY;  Surgeon: Mauri Pole, MD;  Location: [facility];  Service: Orthopedics;  Laterality: Right;    Current Outpatient Medications:  .  acetaminophen (TYLENOL) 500 MG tablet, Take 500 mg by mouth every 6 (six) hours as needed for mild pain., Disp: , Rfl:  .  amLODipine (NORVASC) 2.5 MG tablet, TAKE 1 TABLET BY MOUTH ONCE DAILY, Disp: 30 tablet, Rfl: 3 .  aspirin EC 81 MG tablet, Take 81 mg by mouth daily. Swallow whole., Disp: , Rfl:  .  atorvastatin (LIPITOR) 10 MG tablet, TAKE 1 TABLET BY MOUTH ONCE DAILY, Disp: 30 tablet, Rfl: 3 .  carbidopa-levodopa (SINEMET IR) 25-100 MG tablet, Take 1 tablet by mouth See admin instructions. Take 2 tablets at 7am/1 at 11am/1 at 4pm (Patient taking differently: Take 1 tablet by mouth in the morning and at bedtime. Take 2 tabs at 7am and 1 tab at 11am and 4pm), Disp: 270 tablet, Rfl: 1 .  Cranberry 400 MG CAPS, Take 1 capsule by mouth daily., Disp: , Rfl:  .  HYDROcodone-acetaminophen (NORCO/VICODIN) 5-325 MG tablet, Take 1 tablet by mouth every 6 (six) hours as needed for moderate pain., Disp: , Rfl:  .  levothyroxine (SYNTHROID) 125 MCG tablet, Take 125 mcg by mouth daily before breakfast., Disp: , Rfl:  .  Magnesium 400 MG TABS, Take 1 tablet by mouth daily., Disp: , Rfl:  .  ondansetron (ZOFRAN) 4 MG tablet, Take 4 mg by mouth every 6 (six) hours as needed for nausea or vomiting., Disp: , Rfl:  .  senna-docusate (SENNA-PLUS) 8.6-50 MG tablet, Take 1 tablet by mouth 3 (three) times daily., Disp: , Rfl:  .  venlafaxine  XR (EFFEXOR-XR) 150 MG 24 hr capsule, Take 1 capsule (150 mg total) by mouth daily with breakfast., Disp: 90 capsule, Rfl: 3 .  warfarin (COUMADIN) 6 MG tablet, Take 6 mg by mouth daily., Disp: , Rfl:   Clinical Interview:   The following information was obtained during a clinical interview with Ms. Aydelotte and her daughter prior to  cognitive testing.  Cognitive Symptoms: Decreased short-term memory: Denied. However, her daughter reported significant memory concerns, particularly surrounding her mother's ability to recall previous conversations and the names of familiar individuals. Memory concerns were said to date back at least four years but do not seem to be progressively worsening over time. Her daughter further noted that cognitive concerns appear to come and go.  Decreased long-term memory: Denied.  Decreased attention/concentration: Denied. Her daughter reported significant difficulties with sustained attention and increased distractibility.  Reduced processing speed: Denied. Her daughter reported diminished processing speed.  Difficulties with executive functions: Denied. Trouble with impulsivity or overt personality changes were denied.  Difficulties with emotion regulation: Denied. Difficulties with receptive language: Denied. Difficulties with word finding: Denied. Her daughter reported ongoing word-finding difficulties.  Decreased visuoperceptual ability: Endorsed. For example, Ms. Bonneau described instances where she will go to place something on a surface and either miss this surface or place the object on the surface's edge. This was said to be ongoing for the past six months but appeared largely unchanged.   Difficulties completing ADLs: Endorsed. Ms. Kren currently resides in an assisted living facility where meals and medications are managed and provided to her. Her daughter reported that her mother does not manage her finances personally. She also does not drive due to a combination of visual acuity and cognitive concerns.   Additional Medical History: History of traumatic brain injury/concussion: Denied. Her daughter reported that her mother has experienced numerous falls since 2017, including several where she hit her head. She was unclear if her mother was ever formally diagnosed with a concussion.  Despite Ms. Ticas denying a prior concussive injury, potential cognitive deficits due to a concussive injury was a primary reason for her completing a previous neuropsychological evaluation with Dr. Si Raider in 2018.  History of stroke: Denied. Medical records suggest a prior history of a TIA but no cerebral infarct.  History of seizure activity: Denied. History of known exposure to toxins: Denied. Symptoms of chronic pain: Denied. However, medical records suggest a history of fibromyalgia and several areas where Ms. Reedy previously reported significant chronic pain symptoms.  Experience of frequent headaches/migraines: Denied. Frequent instances of dizziness/vertigo: Denied.  Sensory changes: Ms. Kaster reported poor visual acuity; however, this is improved while wearing corrective lenses. Medical records suggest a history of glaucoma. She also reported a history of hearing loss and that she "needs" to wear hearing aids but doesn't have any. Her daughter clarified that she had been prescribed hearing aids in the past, but did not wear them and is unclear where these may be presently. Ms. Bittinger further reported an increased sense of smell.  Balance/coordination difficulties: Endorsed (see above). Her most recent fall was said to have occurred one month previously. Ms. Knutson reported commonly falling backwards and that she often feels unsteady on her feet. One side of the body was not said to be worse than the other. Balance instability was said to be progressively worsening over time.   Other motor difficulties: Denied. No tremors were reported.   Sleep History: Estimated hours obtained each night: 7-8 hours.  Difficulties falling asleep:  Denied. Difficulties staying asleep: Denied. Feels rested and refreshed upon awakening: Endorsed.  History of snoring: Denied. History of waking up gasping for air: Denied. Witnessed breath cessation while asleep: Denied.  History of vivid dreaming:  Denied. Excessive movement while asleep: Denied. Instances of acting out her dreams: Denied.  Psychiatric/Behavioral Health History: Depression: Ms. Bobak described her current mood as "fine" and denied to her knowledge ever being diagnosed with depression in the past. Her daughter stated that there is a longstanding history of depression and that she currently is prescribed Effexor to treat these symptoms. She denied previously working with a English as a second language teacher. Current or remote suicidal ideation, intent, or plan was denied.  Anxiety: Denied. However, per the results of her previous neuropsychological evaluation, mild symptoms of generalized anxiety were said to be present at that time.  Mania: Denied. Trauma History: Denied. Visual/auditory hallucinations: Denied. However, medical records suggest that Ms. Crumrine was experiencing daily fully-formed hallucinations (i.e., seeing a dog in her environment). Her daughter noted that these seem to have dissipated once levodopa medications were decreased. She was unsure if they had occurred since.  Delusional thoughts: Denied.  Tobacco: Denied. She reportedly quit in 1991.  Alcohol: She denied current alcohol use as well as a history of alcohol abuse or dependence.  Recreational drugs: Denied. Caffeine: Denied.   Family History: Problem Relation Age of Onset  . Prostate cancer Father   . Stroke Father   . Memory loss Sister    This information was confirmed by Ms. Dorthula Rue.  Academic/Vocational History: Highest level of educational attainment: 12 years. 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 No relative weaknesses were identified.  History of developmental delay: Denied. History of grade repetition: Denied. Enrollment in special education courses: Denied. History of LD/ADHD: Denied.  Employment: Retired. 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  Evaluation Results:   Behavioral Observations: Ms. Lirette was accompanied by her daughter, arrived to her appointment on time, and was appropriately dressed and groomed. She appeared alert and oriented. Eye contact was strong throughout the interview with minimal to no blinking nor change in facial expression. She was pushed in a wheelchair by her daughter. As such, gait abnormalities were unable to be directly witnessed. Horizontal/vertical gaze palsy was not identified. Gross motor functioning appeared intact upon informal observation and no abnormal movements (e.g., tremors) were noted. Her affect was somewhat flat and she was quick to deny any report of cognitive concerns or prior medical history. In fact, she very regularly contradicted what is known in her medical history and appeared to be a very poor historian. It was unclear if this was a conscious effort to deny ongoing concerns or if she truly did not remember these events in her past. Spontaneous speech was fluent and word finding difficulties were not observed during the clinical interview. Thought processes were coherent and normal in content. Insight into her cognitive difficulties appeared very poor as she denied all cognitive concerns despite significant impairment across testing. It is possible that she was actively downplaying these difficulties; however, she also exhibited very poor insight regarding her medical history. During testing, her affect was noted to be quite flat. Occasional word finding difficulties were noted. Sustained attention was largely adequate. However, she did require instructions to be repeated often  and appeared to have comprehension difficulties at times. This was also noted across questionnaires to the extent that Ms. Sebastiano required questionnaires be read aloud  to her as she did not comprehend how to answer these forms. At several points during testing, she additionally appeared to forget task instructions in the midst of performing the task at hand.  Task engagement was adequate and she persisted when challenged. Overall, Ms. Vanzee was cooperative with the clinical interview and subsequent testing procedures.   Adequacy of Effort: The validity of neuropsychological testing is limited by the extent to which the individual being tested may be assumed to have exerted adequate effort during testing. Ms. Benningfield expressed her intention to perform to the best of her abilities and exhibited adequate task engagement and persistence. Scores across stand-alone and embedded performance validity measures were within expectation. As such, the results of the current evaluation are believed to be a valid representation of Ms. Lilja current cognitive functioning.  Test Results: Ms. Millis was disoriented to time during the current evaluation. She was unable to state the current year ("76"), date, or time.   Intellectual abilities based upon educational and vocational attainment were estimated to be in the average range. Premorbid abilities were estimated to be within the average range based upon a single-word reading test.   Processing speed was mildly variable, ranging from the exceptionally low to below average normative ranges. Basic attention was average. More complex attention (e.g., working memory) was exceptionally low to well below average. Executive functioning was exceptionally low to well below average. She did perform in the below average range across a task assessing safety and judgment.  Assessed receptive language abilities were exceptionally low. She had trouble understanding sequencing (e.g., point to X before pointing to Y), as well as complex sentence structure and multi-step commands. As stated above, she further had noted trouble understanding  numerous cognitive tasks and mood-related questionnaires. Assessed expressive language (e.g., verbal fluency and confrontation naming) was exceptionally low to well below average.     Assessed visuospatial/visuoconstructional abilities were exceptionally low to well below average. Points were lost on her drawing of a clock due to numerical spacing abnormalities and the inability to draw the clock hands. Points were lost on her drawing of a complex figure due to noted visual distortions, spatial irregularities, and the omission of numerous aspects.    Learning (i.e., encoding) of novel verbal information was below average. Spontaneous delayed recall (i.e., retrieval) of previously learned information was well below average to below average. Retention rates were 62% across a story learning task, 50-67% across a list learning task, and 50% across a complex figure drawing task. Performance across recognition tasks was variable, suggesting some evidence for information consolidation.   Results of emotional screening instruments suggested that recent symptoms of generalized anxiety were in the minimal range, while symptoms of depression were within the mild range. A screening instrument assessing recent sleep quality suggested the presence of minimal sleep dysfunction.  Tables of Scores:   Note: This summary of test scores accompanies the interpretive report and should not be considered in isolation without reference to the appropriate sections in the text. Descriptors are based on appropriate normative data and may be adjusted based on clinical judgment. The terms "impaired" and "within normal limits (WNL)" are used when a more specific level of functioning cannot be determined. Descriptors refer to the current evaluation only.         Effort Testing:    [facility]   July  2018 Current    Dot Counting Test: --- --- --- Within Expectation  WAIS-IV Reliable Digit Span: --- --- --- Within Expectation   CVLT-III Forced Choice Recognition: --- --- --- Within Expectation        Orientation:       Raw Score Raw Score Percentile   NAB Orientation, Form 1 --- 24/29 --- ---        Cognitive Screening:             Raw Score Raw Score Percentile   SLUMS: --- 15/30 --- ---        Intellectual Functioning:             Standard Score Standard Score Percentile   Test of Premorbid Functioning: 102 99 47 Average        Memory:            Wechsler Memory Scale (WMS-IV):                       Raw Score Raw Score (Scaled Score) Percentile     Logical Memory I 35/53 17/53 (6) 9 Below Average    Logical Memory II 21/39 8/39 (7) 16 Below Average    Logical Memory Recognition 19/23 13/23 3-9 Well Below Average        [place] Verbal Learning Test (CVLT-III) Brief Form: Raw Score Raw Score (Scaled/Standard Score) Percentile     Total Trials 1-4 25/36 20/36 (81) 10 Below Average    Short-Delay Free Recall 7/9 5/9 (6) 9 Below Average    Long-Delay Free Recall 7/9 3/9 (4) 2 Well Below Average    Long-Delay Cued Recall 7/9 4/9 (4) 2 Well Below Average      Recognition Hits 9/9 8/9 (10) 50 Average      False Positive Errors 0 4 (3) 1 Exceptionally Low               Raw Score Raw Score (Scaled Score) Percentile   RBANS Figure Copy: 17/20 12/20 (3) 1 Exceptionally Low  RBANS Figure Recall: 15/20 6/20 (6) 9 Below Average        Attention/Executive Function:            Trail Making Test (TMT): Raw Score Raw Score (T Score) Percentile     Part A 46 secs.,  0 errors 92 secs.,  2 errors (21) <1 Exceptionally Low    Part B Discontinued Discontinued --- Impaired          Scaled Score Scaled Score Percentile   WAIS-IV Coding: --- 4 2 Well Below Average         Scaled Score Scaled Score Percentile   WAIS-IV Digit Span: --- 4 2 Well Below Average    Forward 13 10 50 Average    Backward 6 2 <1 Exceptionally Low    Sequencing --- 4 2 Well Below Average         Scaled Score Scaled Score  Percentile   WAIS-IV Similarities: 7 5 5  Well Below Average        D-KEFS Color-Word Interference Test: Raw Score Raw Score (Scaled Score) Percentile     Color Naming --- 51 secs. (3) 1 Exceptionally Low    Word Reading --- 33 secs. (6) 9 Below Average    Inhibition --- 88 secs. (8) 25 Average      Total Errors --- 34 errors (1) <1 Exceptionally Low    Inhibition/Switching --- Discontinued --- Impaired  Total Errors --- --- --- ---        D-KEFS Verbal Fluency Test: Raw Score Raw Score (Scaled Score) Percentile     Letter Total Correct --- 6 (1) <1 Exceptionally Low    Category Total Correct --- 16 (3) 1 Exceptionally Low    Category Switching Total Correct --- 4 (1) <1 Exceptionally Low    Category Switching Accuracy --- 3 (2) <1 Exceptionally Low      Total Set Loss Errors --- 4 (8) 25 Average      Total Repetition Errors --- 6 (7) 16 Below Average        NAB Executive Functions Module, Form 1: T Score T Score Percentile     Judgment --- 37 9 Below Average        Language:            Verbal Fluency Test: Raw Score Raw Score (T Score) Percentile     Phonemic Fluency (FAS) 15 6 (19) <1 Exceptionally Low    Animal Fluency 12 5 (17) <1 Exceptionally Low         NAB Language Module, Form 1: T Score T Score Percentile     Auditory Comprehension --- 25 1 Exceptionally Low    Naming 31/31 (59) 25/31 (35) 7 Well Below Average        Visuospatial/Visuoconstruction:       Raw Score Raw Score Percentile   Clock Drawing: WNL 6/10 --- Impaired        NAB Spatial Module, Form 1: T Score T Score Percentile     Visual Discrimination --- 27 1 Exceptionally Low         Scaled Score Scaled Score Percentile   WAIS-IV Block Design: 9 4 2  Well Below Average        Mood and Personality:       Raw Score Raw Score Percentile   Geriatric Depression Scale: --- 11 --- Mild  Geriatric Anxiety Scale: --- 10 --- Minimal    Somatic --- 5 --- Minimal    Cognitive --- 4 --- Mild    Affective  --- 1 --- Minimal        Additional Questionnaires:       Raw Score Raw Score Percentile   PROMIS Sleep Disturbance Questionnaire: --- 8 --- None to Slight   Informed Consent and Coding/Compliance:   Ms. Skelton was provided with a verbal description of the nature and purpose of the present neuropsychological evaluation. Also reviewed were the foreseeable risks and/or discomforts and benefits of the procedure, limits of confidentiality, and mandatory reporting requirements of this provider. The patient was given the opportunity to ask questions and receive answers about the evaluation. Oral consent to participate was provided by the patient.   This evaluation was conducted by Christia Reading, Ph.D., licensed clinical neuropsychologist. Ms. Lyvers completed a 40 minute comprehensive clinical interview with Dr. Melvyn Novas, billed as one unit [phone], and 125 minutes of cognitive testing and scoring, billed as one unit [phone] and three additional units [id number]. Psychometrist Milana Kidney, B.S., assisted Dr. Melvyn Novas with test administration and scoring procedures. As a separate and discrete service, Dr. Melvyn Novas spent a total of 180 minutes in interpretation and report writing billed as one unit [phone] and two units 96133.

## 2020-02-24 DIAGNOSIS — Z86711 Personal history of pulmonary embolism: Secondary | ICD-10-CM | POA: Diagnosis not present

## 2020-02-25 DIAGNOSIS — I10 Essential (primary) hypertension: Secondary | ICD-10-CM | POA: Diagnosis not present

## 2020-02-25 DIAGNOSIS — R600 Localized edema: Secondary | ICD-10-CM | POA: Diagnosis not present

## 2020-02-25 DIAGNOSIS — Z789 Other specified health status: Secondary | ICD-10-CM | POA: Diagnosis not present

## 2020-03-01 DIAGNOSIS — M79675 Pain in left toe(s): Secondary | ICD-10-CM | POA: Diagnosis not present

## 2020-03-01 DIAGNOSIS — B351 Tinea unguium: Secondary | ICD-10-CM | POA: Diagnosis not present

## 2020-03-01 DIAGNOSIS — M79674 Pain in right toe(s): Secondary | ICD-10-CM | POA: Diagnosis not present

## 2020-03-02 DIAGNOSIS — Z86711 Personal history of pulmonary embolism: Secondary | ICD-10-CM | POA: Diagnosis not present

## 2020-03-04 DIAGNOSIS — E039 Hypothyroidism, unspecified: Secondary | ICD-10-CM | POA: Diagnosis not present

## 2020-03-04 DIAGNOSIS — J45909 Unspecified asthma, uncomplicated: Secondary | ICD-10-CM | POA: Diagnosis not present

## 2020-03-04 DIAGNOSIS — G20 Parkinson's disease: Secondary | ICD-10-CM | POA: Diagnosis not present

## 2020-03-04 DIAGNOSIS — I10 Essential (primary) hypertension: Secondary | ICD-10-CM | POA: Diagnosis not present

## 2020-03-08 ENCOUNTER — Encounter: Payer: Medicare Other | Admitting: Psychology

## 2020-03-16 ENCOUNTER — Other Ambulatory Visit: Payer: Self-pay

## 2020-03-16 ENCOUNTER — Ambulatory Visit (INDEPENDENT_AMBULATORY_CARE_PROVIDER_SITE_OTHER): Payer: Medicare Other | Admitting: Psychology

## 2020-03-16 DIAGNOSIS — G20 Parkinson's disease: Secondary | ICD-10-CM

## 2020-03-16 DIAGNOSIS — F0390 Unspecified dementia without behavioral disturbance: Secondary | ICD-10-CM

## 2020-03-16 NOTE — Patient Instructions (Signed)
I do not have record of Ms. Sovine completing a brain MRI. Unless there is a medical reason for her to not do so (e.g., pacemaker or significant claustrophobia), I would recommend that she be referred for this form of imaging. In particular, it would be helpful to purposefully look for anatomical signs specific to PSP and MSA, rather than just assessing small vessel ischemic changes and the extent of atrophy.  Prior CT scans have suggested the presence of ventriculomegaly. If hydrocephalus is strongly considered by her medical team, Ms. Lubas could discuss the pros and cons of undergoing a lumbar puncture with her neurologist to obtain further diagnostic clarity.   Based upon current test findings, I feel that Ms. Kyllo current placement in assisted living is quite appropriate. She would likely have significant difficulties managing medications and personal finances independently. I agree with her family's decision to have her abstain from all driving pursuits. I would strongly encourage her to utilize her wheelchair in all instances to limit the risk of future falls.  It will be important for Ms. Scruggs to have another person with her when in situations where she may need to process information, weigh the pros and cons of different options, and make decisions, in order to ensure that she fully understands and recalls all information to be considered.  If not already done, Ms. Shimp and her family may want to discuss her wishes regarding durable power of attorney and medical decision making, so that she can have input into these choices. Additionally, they may wish to discuss future plans for caretaking and seek out community options for in home/residential care should they become necessary.  When learning new information, she would benefit from information being broken up into small, manageable pieces. However, given the extent of other cognitive deficits, it would be wise to present all  information important to remember in written format. This should be placed in a highly visible and commonly frequented location in her residence to promote recall.  Ms. Wente had difficulty with receptive language, particularly when presented in complex sentence structures. Presenting information it in short, concrete sentences will be helpful to ensure her comprehension.  To address problems with executive dysfunction, she may wish to consider:   -Avoiding external distractions when needing to concentrate   -Limiting exposure to fast paced environments with multiple sensory demands   -Writing down complicated information and using checklists   -Attempting and completing one task at a time (i.e., no multi-tasking)   -Verbalizing aloud each step of a task to maintain focus   -Taking frequent breaks during the completion of steps/tasks to avoid fatigue   -Reducing the amount of information considered at one time

## 2020-03-16 NOTE — Progress Notes (Signed)
   Neuropsychology Feedback Session [name] [name]. [facility]  Reason for Referral:   [name] [name] a 75 y.o. right-handed Caucasian female referred by Alonza Bogus, D.O.,to characterize hercurrent cognitive functioning and assist with diagnostic clarity and treatment planning in the context of subjective cognitive decline, history of atypical Parkinson's disease, and frequent falling behaviors.   Feedback:   [name] [name] completed a comprehensive neuropsychological evaluation on 02/23/2020. Please refer to that encounter for the full report and recommendations. Briefly, results suggested diffuse cognitive impairment, often in the exceptionally low to well below average normative ranges relative to age-matched peers. Basic attention was intact and represented her lone strength. She also performed in the below average range across a task assessing safety and judgment. Relative to her previous evaluation, performance declines were seen across every test and assessed cognitive domain. Given the widespread degree of cognitive decline, the presence of a neurodegenerative illness seems extremely likely. However, identifying that illness remains challenging. The primary neurological conditions which result in significant balance instability and frequent falls include progressive supranuclear palsy (PSP), hydrocephalus, and multisystem atrophy (MSA), particularly the olivopontocerebellar subtype. However, her match with these conditions is inconsistent. Continued medical monitoring will be important moving forward.   [name] [name] was accompanied by her daughter during the current feedback session. Content of the current session focused on the results of her neuropsychological evaluation. [name] [name] and her daughter were given the opportunity to ask questions and their questions were answered. They were encouraged to reach out should additional questions arise. A copy of  her report was provided at the conclusion of the visit.      Less than 16 minutes were spent conducting the current feedback session with [name] [name].

## 2020-05-02 NOTE — Progress Notes (Deleted)
[place] at [facility] [address], [place], [place] [address] [phone]  Date:  05/05/2020   Name:  [name] [name]   DOB:  [birth year]   MRN:  [id number]  PCP:  Darreld Mclean, MD    Chief Complaint: No chief complaint on file.   History of Present Illness:  [name] [name] is a 75 y.o. very pleasant female patient who presents with the following:  Patient here today for 47-monthfollow-up visit Last seen by myself in August  She has a somewhat complex medical history, including Parkinson's disease, recurrent UTI, pulmonary embolism due to hypercoagulable state on chronic Coumadin, gait disturbance, TIA, hypertension She has a home INR machine which she uses to monitor her Coumadin  Last summer she had a fall, sustained a clavicular fracture.  She moved to a new assisted living location which offers more services  COVID-19 series Mammogram up-to-date DEXA scan up-to-date  She is seeing neurology as well as neuropsychiatry-most recent visit with neuropsychiatry in January of this year.  She is noted to have diffuse cognitive impairment and has been declining  Amlodipine Baby aspirin Atorvastatin Sinemet Levothyroxine Effexor Coumadin  Lab Results  Component Value Date   TSH 1.22 11/03/2019   ?  How is it going with home Coumadin checks  Noted elevated alk phosphatase at last labs, will recheck as well as GGT today Patient Active Problem List   Diagnosis Date Noted  . Major neurocognitive disorder 02/23/2020  . Complicated UTI (urinary tract infection) 01/03/2019  . Subtherapeutic international normalized ratio (INR) 01/03/2019  . Hyponatremia 01/03/2019  . Adenoma of right adrenal gland 02/20/2018  . Osteopenia 12/18/2017  . Glaucoma 10/13/2017  . History of TIA (transient ischemic attack) 10/13/2017  . History of prediabetes 10/13/2017  . Recurrent UTI 11/23/2016  . Fibromyalgia 11/23/2016  . Pulmonary  hypertension 11/23/2016  . Left ventricular diastolic dysfunction 080/88/1103 . History of pulmonary embolus (PE) 2/2 hypercoagulable state 11/23/2016  . History of sepsis 09/17/2016  . SIRS (systemic inflammatory response syndrome) 04/29/2016  . Hyperlipidemia 04/29/2016  . Hypothyroidism 04/29/2016  . Hematuria 04/05/2016  . Benign essential hypertension   . Gait disturbance 12/17/2015  . Trochanteric bursitis of both hips 09/13/2015  . Chronic low back pain 06/07/2015  . Atypical parkinsonism state 05/17/2015  . S/P right TKA 05/22/2011    Past Medical History:  Diagnosis Date  . Acute metabolic encephalopathy 115/94/5859 . Adenoma of right adrenal gland 02/20/2018  . AKI (acute kidney injury) 02/12/2017  . Arthritis   . Atypical parkinsonism state 05/17/2015  . Benign essential hypertension   . Complication of anesthesia    severe itching night of surgery requiring meds- also couldnt swallow- throat "paralyzed""  . Displaced fracture of fifth cervical vertebra 12/17/2015  . Fibromyalgia   . Gait disturbance 12/17/2015  . Glaucoma 10/13/2017  . Hematuria 04/05/2016  . History of prediabetes 10/13/2017  . History of pulmonary embolus (PE) 2/2 hypercoagulable state 11/23/2016  . History of sepsis 09/17/2016  . History of TIA (transient ischemic attack) 10/13/2017  . Hypercholesterolemia   . Hyperlipidemia   . Hyponatremia 01/03/2019  . Hypothyroidism    s/p graves disease  . Kidney stone   . Left ventricular diastolic dysfunction 029/24/4628 . Major neurocognitive disorder 02/23/2020  . Osteopenia 12/18/2017  . Peripheral vascular disease    PULMONARY EMBOLUS x 2- 2011, 2012/ FOLLOWED BY DR [id number] 12/12 EPIC   PT [facility] WILL  STOP COUMADIN 3/12 and begin LOVONOX 05/17/11 as previously instructed  . Pulmonary hypertension 11/23/2016  . Recurrent UTI 11/23/2016  . S/P right TKA 05/22/2011  . Sinus infection   . SIRS (systemic inflammatory response syndrome) 04/29/2016   . Subtherapeutic international normalized ratio (INR) 01/03/2019  . Trochanteric bursitis of both hips 09/13/2015    Past Surgical History:  Procedure Laterality Date  . ABDOMINAL HYSTERECTOMY    . APPENDECTOMY    . CATARACT EXTRACTION Bilateral   . CHOLECYSTECTOMY    . COLONOSCOPY    . EXPLORATORY LAPAROTOMY    . JOINT REPLACEMENT     left knee  7/12  . TOTAL KNEE ARTHROPLASTY  05/22/2011   Procedure: TOTAL KNEE ARTHROPLASTY;  Surgeon: Matthew D Olin, MD;  Location: [facility];  Service: Orthopedics;  Laterality: Right;    Social History   Tobacco Use  . Smoking status: Former Smoker    Packs/day: 1.00    Quit date: 05/10/1989    Years since quitting: 31.0  . Smokeless tobacco: Never Used  Vaping Use  . Vaping Use: Never used  Substance Use Topics  . Alcohol use: Not Currently  . Drug use: No    Family History  Problem Relation Age of Onset  . Prostate cancer Father   . Stroke Father   . Memory loss Sister     Allergies  Allergen Reactions  . Crestor [Rosuvastatin Calcium] Other (See Comments)    Feeling very bad, aching all over.  . Erythromycin Hives  . Gabapentin Other (See Comments)    Blurred vision  . Pregabalin Other (See Comments)    Crossed vision.  . Pregabalin Other (See Comments)    Crossed vision. Unknown   . Macrobid [Nitrofurantoin] Hives  . Losartan Itching    Medication list has been reviewed and updated.  Current Outpatient Medications on File Prior to Visit  Medication Sig Dispense Refill  . acetaminophen (TYLENOL) 500 MG tablet Take 500 mg by mouth every 6 (six) hours as needed for mild pain.    . amLODipine (NORVASC) 2.5 MG tablet TAKE 1 TABLET BY MOUTH ONCE DAILY 30 tablet 3  . aspirin EC 81 MG tablet Take 81 mg by mouth daily. Swallow whole.    . atorvastatin (LIPITOR) 10 MG tablet TAKE 1 TABLET BY MOUTH ONCE DAILY 30 tablet 3  . carbidopa-levodopa (SINEMET IR) 25-100 MG tablet Take 1 tablet by mouth See admin instructions. Take 2  tablets at 7am/1 at 11am/1 at 4pm (Patient taking differently: Take 1 tablet by mouth in the morning and at bedtime. Take 2 tabs at 7am and 1 tab at 11am and 4pm) 270 tablet 1  . Cranberry 400 MG CAPS Take 1 capsule by mouth daily.    . HYDROcodone-acetaminophen (NORCO/VICODIN) 5-325 MG tablet Take 1 tablet by mouth every 6 (six) hours as needed for moderate pain.    . levothyroxine (SYNTHROID) 125 MCG tablet Take 125 mcg by mouth daily before breakfast.    . Magnesium 400 MG TABS Take 1 tablet by mouth daily.    . ondansetron (ZOFRAN) 4 MG tablet Take 4 mg by mouth every 6 (six) hours as needed for nausea or vomiting.    . senna-docusate (SENNA-PLUS) 8.6-50 MG tablet Take 1 tablet by mouth 3 (three) times daily.    . venlafaxine XR (EFFEXOR-XR) 150 MG 24 hr capsule Take 1 capsule (150 mg total) by mouth daily with breakfast. 90 capsule 3  . warfarin (COUMADIN) 6 MG tablet Take 6 mg   by mouth daily.     No current facility-administered medications on file prior to visit.    Review of Systems:  As per HPI- otherwise negative.   Physical Examination: There were no vitals filed for this visit. There were no vitals filed for this visit. There is no height or weight on file to calculate BMI. Ideal Body Weight:    GEN: no acute distress. HEENT: Atraumatic, Normocephalic.  Ears and Nose: No external deformity. CV: RRR, No M/G/R. No JVD. No thrill. No extra heart sounds. PULM: CTA B, no wheezes, crackles, rhonchi. No retractions. No resp. distress. No accessory muscle use. ABD: S, NT, ND, +BS. No rebound. No HSM. EXTR: No c/c/e PSYCH: Normally interactive. Conversant.    Assessment and Plan: *** This visit occurred during the SARS-CoV-2 public health emergency.  Safety protocols were in place, including screening questions prior to the visit, additional usage of staff PPE, and extensive cleaning of exam room while observing appropriate contact time as indicated for disinfecting solutions.     Signed  , MD  

## 2020-05-05 ENCOUNTER — Ambulatory Visit: Payer: Medicare Other | Admitting: Family Medicine

## 2020-05-05 DIAGNOSIS — R7303 Prediabetes: Secondary | ICD-10-CM

## 2020-05-05 DIAGNOSIS — R748 Abnormal levels of other serum enzymes: Secondary | ICD-10-CM

## 2020-05-05 DIAGNOSIS — E782 Mixed hyperlipidemia: Secondary | ICD-10-CM

## 2020-05-05 DIAGNOSIS — E039 Hypothyroidism, unspecified: Secondary | ICD-10-CM

## 2020-05-05 DIAGNOSIS — I10 Essential (primary) hypertension: Secondary | ICD-10-CM

## 2020-05-05 DIAGNOSIS — G20 Parkinson's disease: Secondary | ICD-10-CM

## 2020-05-23 NOTE — Patient Instructions (Incomplete)
Good to see you again today  

## 2020-05-23 NOTE — Progress Notes (Deleted)
[place] at [facility] [address] [place], [place] [address] [phone]  Date:  05/27/2020   Name:  [name] [name]   DOB:  Jun 01, 1945   MRN:  [id number]  PCP:  Darreld Mclean, MD    Chief Complaint: No chief complaint on file.   History of Present Illness:  Lavada Langsam is a 75 y.o. very pleasant female patient who presents with the following:  6 month follow-up visit today Last seen by myself in August  She has a somewhat complex medical history, including Parkinson's disease, recurrent UTI, pulmonary embolism due to hypercoagulable state on chronic Coumadin, gait disturbance, TIA, hypertension She has a home INR machine which she uses to monitor her Coumadin  covid series shingrix  Patient Active Problem List   Diagnosis Date Noted  . Major neurocognitive disorder 02/23/2020  . Complicated UTI (urinary tract infection) 01/03/2019  . Subtherapeutic international normalized ratio (INR) 01/03/2019  . Hyponatremia 01/03/2019  . Adenoma of right adrenal gland 02/20/2018  . Osteopenia 12/18/2017  . Glaucoma 10/13/2017  . History of TIA (transient ischemic attack) 10/13/2017  . History of prediabetes 10/13/2017  . Recurrent UTI 11/23/2016  . Fibromyalgia 11/23/2016  . Pulmonary hypertension 11/23/2016  . Left ventricular diastolic dysfunction 79/89/2119  . History of pulmonary embolus (PE) 2/2 hypercoagulable state 11/23/2016  . History of sepsis 09/17/2016  . SIRS (systemic inflammatory response syndrome) 04/29/2016  . Hyperlipidemia 04/29/2016  . Hypothyroidism 04/29/2016  . Hematuria 04/05/2016  . Benign essential hypertension   . Gait disturbance 12/17/2015  . Trochanteric bursitis of both hips 09/13/2015  . Chronic low back pain 06/07/2015  . Atypical parkinsonism state 05/17/2015  . S/P right TKA 05/22/2011    Past Medical History:  Diagnosis Date  . Acute metabolic encephalopathy 41/74/0814  .  Adenoma of right adrenal gland 02/20/2018  . AKI (acute kidney injury) 02/12/2017  . Arthritis   . Atypical parkinsonism state 05/17/2015  . Benign essential hypertension   . Complication of anesthesia    severe itching night of surgery requiring meds- also couldnt swallow- throat "paralyzed""  . Displaced fracture of fifth cervical vertebra 12/17/2015  . Fibromyalgia   . Gait disturbance 12/17/2015  . Glaucoma 10/13/2017  . Hematuria 04/05/2016  . History of prediabetes 10/13/2017  . History of pulmonary embolus (PE) 2/2 hypercoagulable state 11/23/2016  . History of sepsis 09/17/2016  . History of TIA (transient ischemic attack) 10/13/2017  . Hypercholesterolemia   . Hyperlipidemia   . Hyponatremia 01/03/2019  . Hypothyroidism    s/p graves disease  . Kidney stone   . Left ventricular diastolic dysfunction 48/18/5631  . Major neurocognitive disorder 02/23/2020  . Osteopenia 12/18/2017  . Peripheral vascular disease    PULMONARY EMBOLUS x 2- 2011, 2012/ FOLLOWED BY DR ODOGWU-LOV 12/12 EPIC   PT STAES WILL STOP COUMADIN 3/12 and begin LOVONOX 05/17/11 as previously instructed  . Pulmonary hypertension 11/23/2016  . Recurrent UTI 11/23/2016  . S/P right TKA 05/22/2011  . Sinus infection   . SIRS (systemic inflammatory response syndrome) 04/29/2016  . Subtherapeutic international normalized ratio (INR) 01/03/2019  . Trochanteric bursitis of both hips 09/13/2015    Past Surgical History:  Procedure Laterality Date  . ABDOMINAL HYSTERECTOMY    . APPENDECTOMY    . CATARACT EXTRACTION Bilateral   . CHOLECYSTECTOMY    . COLONOSCOPY    . EXPLORATORY LAPAROTOMY    . JOINT REPLACEMENT     left knee  7/12  . TOTAL KNEE ARTHROPLASTY  05/22/2011   Procedure: TOTAL KNEE ARTHROPLASTY;  Surgeon: Mauri Pole, MD;  Location: [facility];  Service: Orthopedics;  Laterality: Right;    Social History   Tobacco Use  . Smoking status: Former Smoker    Packs/day: 1.00    Quit date: 05/10/1989     Years since quitting: 31.0  . Smokeless tobacco: Never Used  Vaping Use  . Vaping Use: Never used  Substance Use Topics  . Alcohol use: Not Currently  . Drug use: No    Family History  Problem Relation Age of Onset  . Prostate cancer Father   . Stroke Father   . Memory loss Sister     Allergies  Allergen Reactions  . Crestor [Rosuvastatin Calcium] Other (See Comments)    Feeling very bad, aching all over.  . Erythromycin Hives  . Gabapentin Other (See Comments)    Blurred vision  . Pregabalin Other (See Comments)    Crossed vision.  . Pregabalin Other (See Comments)    Crossed vision. Unknown   . Macrobid [Nitrofurantoin] Hives  . Losartan Itching    Medication list has been reviewed and updated.  Current Outpatient Medications on File Prior to Visit  Medication Sig Dispense Refill  . acetaminophen (TYLENOL) 500 MG tablet Take 500 mg by mouth every 6 (six) hours as needed for mild pain.    Marland Kitchen amLODipine (NORVASC) 2.5 MG tablet TAKE 1 TABLET BY MOUTH ONCE DAILY 30 tablet 3  . aspirin EC 81 MG tablet Take 81 mg by mouth daily. Swallow whole.    Marland Kitchen atorvastatin (LIPITOR) 10 MG tablet TAKE 1 TABLET BY MOUTH ONCE DAILY 30 tablet 3  . carbidopa-levodopa (SINEMET IR) 25-100 MG tablet Take 1 tablet by mouth See admin instructions. Take 2 tablets at 7am/1 at 11am/1 at 4pm (Patient taking differently: Take 1 tablet by mouth in the morning and at bedtime. Take 2 tabs at 7am and 1 tab at 11am and 4pm) 270 tablet 1  . Cranberry 400 MG CAPS Take 1 capsule by mouth daily.    Marland Kitchen HYDROcodone-acetaminophen (NORCO/VICODIN) 5-325 MG tablet Take 1 tablet by mouth every 6 (six) hours as needed for moderate pain.    Marland Kitchen levothyroxine (SYNTHROID) 125 MCG tablet Take 125 mcg by mouth daily before breakfast.    . Magnesium 400 MG TABS Take 1 tablet by mouth daily.    . ondansetron (ZOFRAN) 4 MG tablet Take 4 mg by mouth every 6 (six) hours as needed for nausea or vomiting.    . senna-docusate  (SENNA-PLUS) 8.6-50 MG tablet Take 1 tablet by mouth 3 (three) times daily.    Marland Kitchen venlafaxine XR (EFFEXOR-XR) 150 MG 24 hr capsule Take 1 capsule (150 mg total) by mouth daily with breakfast. 90 capsule 3  . warfarin (COUMADIN) 6 MG tablet Take 6 mg by mouth daily.     No current facility-administered medications on file prior to visit.    Review of Systems:  As per HPI- otherwise negative.   Physical Examination: There were no vitals filed for this visit. There were no vitals filed for this visit. There is no height or weight on file to calculate BMI. Ideal Body Weight:    GEN: no acute distress. HEENT: Atraumatic, Normocephalic.  Ears and Nose: No external deformity. CV: RRR, No M/G/R. No JVD. No thrill. No extra heart sounds. PULM: CTA B, no wheezes, crackles, rhonchi. No retractions. No resp. distress. No accessory muscle use. ABD: S,  NT, ND, +BS. No rebound. No HSM. EXTR: No c/c/e PSYCH: Normally interactive. Conversant.    Assessment and Plan: *** This visit occurred during the SARS-CoV-2 public health emergency.  Safety protocols were in place, including screening questions prior to the visit, additional usage of staff PPE, and extensive cleaning of exam room while observing appropriate contact time as indicated for disinfecting solutions.    Signed Lamar Blinks, MD

## 2020-05-27 ENCOUNTER — Ambulatory Visit: Payer: Medicare Other | Admitting: Family Medicine

## 2020-06-05 NOTE — Progress Notes (Addendum)
[place] at [facility] [address], [place], [place] [address] [phone]  Date:  06/09/2020   Name:  [name] [name]   DOB:  1945-07-03   MRN:  [id number]  PCP:  Darreld Mclean, MD    Chief Complaint: No chief complaint on file.   History of Present Illness:  [name] [name] is a 75 y.o. very pleasant female patient who presents with the following:  Here today for 33-month follow-up visit Last seen by myself in August Here today with [name].  [name] has noticed mental status decline in her mom, as detailed in neuropsychiatry notes below  She is able to feed herself She needs help with dressing, bathing, and other ADLs. She lives at an assisted living facility which is working out relatively well.  They offer assistance for her, but not 24-hour sitters  She has a somewhat complex medical history, including Parkinson's disease, recurrent UTI, pulmonary embolism due to hypercoagulable state on chronic Coumadin, gait disturbance, TIA, hypertension She has a home INR machine which she uses to monitor her Coumadin She lives at an assisted living facility, [facility]  Seen by neuropsychiatry most recently in [place] it appears she has had a mental decline Relative to her previous evaluation, performance declines were seen across every test and assessed cognitive domain. Given thewidespread degree of cognitive decline, the presence of a neurodegenerative illness seems extremely likely. However, identifying that illness remains challenging. The primary neurological conditions which result in significant balance instability and frequent falls include progressive supranuclear palsy (PSP), hydrocephalus, and multisystem atrophy (MSA), particularly the olivopontocerebellar subtype. However, her match with these conditions is inconsistent. Continued medical monitoring will be important moving forward.  Mammogram  is UTD She is due for a dexa-we will plan to update this as she does have frequent falls due to her movement disorder Most recent labs done in August  Amlodipine 2.5 Baby aspirin Lipitor Sinemet Synthroid Venlafaxine Coumadin  She is now taking metformin 500 bid- apparently her A1c went way up the last time her A1c was checked at her facility.  [name] thinks it was around 11%  They also have noticed some irritation and redness of her groin area.  She has been on ketoconazole cream twice a day for about a month.  Symptoms have unfortunately not remitted  They continue to manage her coumadin at her facility   Lab Results  Component Value Date   HGBA1C 6.1 (H) 11/03/2019    Lab Results  Component Value Date   TSH 1.22 11/03/2019    Patient Active Problem List   Diagnosis Date Noted  . Major neurocognitive disorder 02/23/2020  . Complicated UTI (urinary tract infection) 01/03/2019  . Subtherapeutic international normalized ratio (INR) 01/03/2019  . Hyponatremia 01/03/2019  . Adenoma of right adrenal gland 02/20/2018  . Osteopenia 12/18/2017  . Glaucoma 10/13/2017  . History of TIA (transient ischemic attack) 10/13/2017  . History of prediabetes 10/13/2017  . Recurrent UTI 11/23/2016  . Fibromyalgia 11/23/2016  . Pulmonary hypertension 11/23/2016  . Left ventricular diastolic dysfunction 69/62/9528  . History of pulmonary embolus (PE) 2/2 hypercoagulable state 11/23/2016  . History of sepsis 09/17/2016  . SIRS (systemic inflammatory response syndrome) 04/29/2016  . Hyperlipidemia 04/29/2016  . Hypothyroidism 04/29/2016  . Hematuria 04/05/2016  . Benign essential hypertension   . Gait disturbance 12/17/2015  . Trochanteric bursitis of both hips 09/13/2015  . Chronic low back pain 06/07/2015  .  Atypical parkinsonism state 05/17/2015  . S/P right TKA 05/22/2011    Past Medical History:  Diagnosis Date  . Acute metabolic encephalopathy 37/16/9678  .  Adenoma of right adrenal gland 02/20/2018  . AKI (acute kidney injury) 02/12/2017  . Arthritis   . Atypical parkinsonism state 05/17/2015  . Benign essential hypertension   . Complication of anesthesia    severe itching night of surgery requiring meds- also couldnt swallow- throat "paralyzed""  . Displaced fracture of fifth cervical vertebra 12/17/2015  . Fibromyalgia   . Gait disturbance 12/17/2015  . Glaucoma 10/13/2017  . Hematuria 04/05/2016  . History of prediabetes 10/13/2017  . History of pulmonary embolus (PE) 2/2 hypercoagulable state 11/23/2016  . History of sepsis 09/17/2016  . History of TIA (transient ischemic attack) 10/13/2017  . Hypercholesterolemia   . Hyperlipidemia   . Hyponatremia 01/03/2019  . Hypothyroidism    s/p graves disease  . Kidney stone   . Left ventricular diastolic dysfunction 93/81/0175  . Major neurocognitive disorder 02/23/2020  . Osteopenia 12/18/2017  . Peripheral vascular disease    PULMONARY EMBOLUS x 2- 2011, 2012/ FOLLOWED BY DR ODOGWU-LOV 12/12 EPIC   PT STAES WILL STOP COUMADIN 3/12 and begin LOVONOX 05/17/11 as previously instructed  . Pulmonary hypertension 11/23/2016  . Recurrent UTI 11/23/2016  . S/P right TKA 05/22/2011  . Sinus infection   . SIRS (systemic inflammatory response syndrome) 04/29/2016  . Subtherapeutic international normalized ratio (INR) 01/03/2019  . Trochanteric bursitis of both hips 09/13/2015    Past Surgical History:  Procedure Laterality Date  . ABDOMINAL HYSTERECTOMY    . APPENDECTOMY    . CATARACT EXTRACTION Bilateral   . CHOLECYSTECTOMY    . COLONOSCOPY    . EXPLORATORY LAPAROTOMY    . JOINT REPLACEMENT     left knee  7/12  . TOTAL KNEE ARTHROPLASTY  05/22/2011   Procedure: TOTAL KNEE ARTHROPLASTY;  Surgeon: Mauri Pole, MD;  Location: [facility];  Service: Orthopedics;  Laterality: Right;    Social History   Tobacco Use  . Smoking status: Former Smoker    Packs/day: 1.00    Quit date: 05/10/1989     Years since quitting: 31.1  . Smokeless tobacco: Never Used  Vaping Use  . Vaping Use: Never used  Substance Use Topics  . Alcohol use: Not Currently  . Drug use: No    Family History  Problem Relation Age of Onset  . Prostate cancer Father   . Stroke Father   . Memory loss Sister     Allergies  Allergen Reactions  . Crestor [Rosuvastatin Calcium] Other (See Comments)    Feeling very bad, aching all over.  . Erythromycin Hives  . Gabapentin Other (See Comments)    Blurred vision  . Pregabalin Other (See Comments)    Crossed vision.  . Pregabalin Other (See Comments)    Crossed vision. Unknown   . Macrobid [Nitrofurantoin] Hives  . Losartan Itching    Medication list has been reviewed and updated.  Current Outpatient Medications on File Prior to Visit  Medication Sig Dispense Refill  . acetaminophen (TYLENOL) 500 MG tablet Take 500 mg by mouth every 6 (six) hours as needed for mild pain.    Marland Kitchen amLODipine (NORVASC) 2.5 MG tablet TAKE 1 TABLET BY MOUTH ONCE DAILY 30 tablet 3  . aspirin EC 81 MG tablet Take 81 mg by mouth daily. Swallow whole.    Marland Kitchen atorvastatin (LIPITOR) 10 MG tablet TAKE 1 TABLET BY MOUTH ONCE  DAILY 30 tablet 3  . carbidopa-levodopa (SINEMET IR) 25-100 MG tablet Take 1 tablet by mouth See admin instructions. Take 2 tablets at 7am/1 at 11am/1 at 4pm (Patient taking differently: Take 1 tablet by mouth in the morning and at bedtime. Take 2 tabs at 7am and 1 tab at 11am and 4pm) 270 tablet 1  . Cranberry 400 MG CAPS Take 1 capsule by mouth daily.    Marland Kitchen HYDROcodone-acetaminophen (NORCO/VICODIN) 5-325 MG tablet Take 1 tablet by mouth every 6 (six) hours as needed for moderate pain.    Marland Kitchen levothyroxine (SYNTHROID) 125 MCG tablet Take 125 mcg by mouth daily before breakfast.    . Magnesium 400 MG TABS Take 1 tablet by mouth daily.    . ondansetron (ZOFRAN) 4 MG tablet Take 4 mg by mouth every 6 (six) hours as needed for nausea or vomiting.    . senna-docusate  (SENOKOT-S) 8.6-50 MG tablet Take 1 tablet by mouth 3 (three) times daily.    Marland Kitchen venlafaxine XR (EFFEXOR-XR) 150 MG 24 hr capsule Take 1 capsule (150 mg total) by mouth daily with breakfast. 90 capsule 3  . warfarin (COUMADIN) 6 MG tablet Take 6 mg by mouth daily.     No current facility-administered medications on file prior to visit.    Review of Systems:  As per HPI- otherwise negative.   Physical Examination: Vitals:   06/09/20 1107  BP: 120/76  Pulse: 100  Resp: 20  SpO2: 97%   Vitals:   06/09/20 1107  Height: 5\' 6"  (1.676 m)   Body mass index is 29.86 kg/m. Ideal Body Weight: Weight in (lb) to have BMI = 25: 154.6  GEN: no acute distress.  Appears comfortable but is more quiet than previously.  The patient is less interactive than she has been in years past.  She defers to her daughter to give most answers about her health HEENT: Atraumatic, Normocephalic.  Ears and Nose: No external deformity. CV: RRR, No M/G/R. No JVD. No thrill. No extra heart sounds. PULM: CTA B, no wheezes, crackles, rhonchi. No retractions. No resp. distress. No accessory muscle use. ABD: S, NT, ND EXTR: No c/c/e PSYCH: Normally interactive. Conversant.  She is able to stand with some assistance.  I noticed some mild skin breakdown in the gluteal cleft, some redness and irritation of the perineum.  No other evidence of sacral decub  Assessment and Plan: Essential hypertension - Plan: Basic metabolic panel  Mixed hyperlipidemia  Parkinson disease (HCC)  Pre-diabetes - Plan: Hemoglobin A1c  Gait disorder - Plan: DG Bone Density  Memory loss  Candida infection - Plan: fluconazole (DIFLUCAN) 150 MG tablet  Hypothyroidism, unspecified type - Plan: TSH  Estrogen deficiency - Plan: DG Bone Density  Here today for follow-up visit.  Unfortunately [name] [name] seems to have a progressive neurodegenerative disorder and is losing her memory and mental capacity. Her blood pressure under reasonable  control, check basic metabolic today and will follow up on her thyroid, A1c She is now taking Metformin per her assisted-living facility due to elevated blood sugars Ordered bone density, if osteoporosis we will plan to treat given history of frequent falls Perineal yeast infection which has failed to clear up with about 1 month of topical therapy.  Called in [place], sent a written note to her assisted living facility.  Diflucan may increase INR.  Please check INR 48 hours after each dose, consider decreasing dose of Coumadin about 10% during treatment Will plan further follow- up pending labs. Plan  to visit in about 6 months assuming all is well This visit occurred during the SARS-CoV-2 public health emergency.  Safety protocols were in place, including screening questions prior to the visit, additional usage of staff PPE, and extensive cleaning of exam room while observing appropriate contact time as indicated for disinfecting solutions.    Signed Lamar Blinks, MD  Received her labs as below- message to pt  Results for orders placed or performed in visit on 63/78/58  Basic metabolic panel  Result Value Ref Range   Sodium 138 135 - 145 mEq/L   Potassium 4.4 3.5 - 5.1 mEq/L   Chloride 99 96 - 112 mEq/L   CO2 29 19 - 32 mEq/L   Glucose, Bld 330 (H) 70 - 99 mg/dL   BUN 10 6 - 23 mg/dL   Creatinine, Ser 0.75 0.40 - 1.20 mg/dL   GFR 76.88 >60.00 mL/min   Calcium 9.6 8.4 - 10.5 mg/dL  TSH  Result Value Ref Range   TSH 4.19 0.35 - 4.50 uIU/mL  Hemoglobin A1c  Result Value Ref Range   Hgb A1c MFr Bld 11.9 (H) 4.6 - 6.5 %

## 2020-06-09 ENCOUNTER — Other Ambulatory Visit: Payer: Self-pay

## 2020-06-09 ENCOUNTER — Ambulatory Visit (INDEPENDENT_AMBULATORY_CARE_PROVIDER_SITE_OTHER): Payer: Medicare Other | Admitting: Family Medicine

## 2020-06-09 ENCOUNTER — Encounter: Payer: Self-pay | Admitting: Family Medicine

## 2020-06-09 VITALS — BP 120/76 | HR 100 | Resp 20 | Ht 66.0 in

## 2020-06-09 DIAGNOSIS — E039 Hypothyroidism, unspecified: Secondary | ICD-10-CM | POA: Diagnosis not present

## 2020-06-09 DIAGNOSIS — R7303 Prediabetes: Secondary | ICD-10-CM

## 2020-06-09 DIAGNOSIS — G20 Parkinson's disease: Secondary | ICD-10-CM

## 2020-06-09 DIAGNOSIS — I10 Essential (primary) hypertension: Secondary | ICD-10-CM | POA: Diagnosis not present

## 2020-06-09 DIAGNOSIS — E782 Mixed hyperlipidemia: Secondary | ICD-10-CM | POA: Diagnosis not present

## 2020-06-09 DIAGNOSIS — R269 Unspecified abnormalities of gait and mobility: Secondary | ICD-10-CM

## 2020-06-09 DIAGNOSIS — R413 Other amnesia: Secondary | ICD-10-CM

## 2020-06-09 DIAGNOSIS — E2839 Other primary ovarian failure: Secondary | ICD-10-CM

## 2020-06-09 DIAGNOSIS — B379 Candidiasis, unspecified: Secondary | ICD-10-CM

## 2020-06-09 LAB — TSH: TSH: 4.19 u[IU]/mL (ref 0.35–4.50)

## 2020-06-09 LAB — BASIC METABOLIC PANEL
BUN: 10 mg/dL (ref 6–23)
CO2: 29 mEq/L (ref 19–32)
Calcium: 9.6 mg/dL (ref 8.4–10.5)
Chloride: 99 mEq/L (ref 96–112)
Creatinine, Ser: 0.75 mg/dL (ref 0.40–1.20)
GFR: 76.88 mL/min (ref >60.00)
Glucose, Bld: 330 mg/dL — ABNORMAL HIGH (ref 70–99)
Potassium: 4.4 mEq/L (ref 3.5–5.1)
Sodium: 138 mEq/L (ref 135–145)

## 2020-06-09 LAB — HEMOGLOBIN A1C: Hgb A1c MFr Bld: 11.9 % — ABNORMAL HIGH (ref 4.6–6.5)

## 2020-06-09 MED ORDER — FLUCONAZOLE 150 MG PO TABS: 150.0000 mg | ORAL_TABLET | Freq: Once | ORAL | 0 refills | Status: AC

## 2020-06-09 NOTE — Patient Instructions (Signed)
It was good to see you again today! I will be in touch with your labs Stop by imaging today and see if they can do your bone density test now We will use diflucan (oral yeast pill) once, repeat in a week if needed.  We will need to monitor your INR more closely after this dose  Assuming all is well please see me in about 6 months

## 2020-06-10 NOTE — Progress Notes (Signed)
Assessment/Plan:   1.  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  Understands that there is an increased risk of falls, aspiration as subsequent morbidity and mortality from this disease compared to Parkinson's disease.  We have asked her for a long time to stay seated and not walk because of recurrent falls and reiterated this today.  This includes getting assist for transfers.  -needs full time care at least while awake.  Discussed affordability of this.  Discussed potentially hiring nonskilled caregivers to be with her to help her with various needs during the daytime.  My social worker is also going to meet with them.  She introduced herself, and they are going to set up a meeting time.  -pt is DNR  -continue carbidopa/levodopa 25/100 2/1/1 for now  2.  Dementia  -This was confirmed by neurocognitive testing in December, 2021 with Dr. Melvyn Novas.  -discussed that hydrocodone can make worse and they aren't sure she is taking it/getting it.  On list but may not be taking.  PDMP doesn't look like she has gotten RX in while  -discussed acetylcholinesterase inhibitor along with risks and benefits.  Patient/family would like to try.  We will start with 5 mg daily for a month and then increase to 10 mg daily.  R/B/SE were discussed.  The opportunity to ask questions was given and they were answered to the best of my ability.  The patient expressed understanding and willingness to follow the outlined treatment protocols.   3.  Cervical dystonia, secondary to #1  -Have done Botox in the past.  Have decided not to do this again because of inconsistency with showing up and the fact that Botox does not work this way (multiple no-shows and cancellations) Subjective:   [name] [name] was seen today in follow up for atypical parkinsonism.  My previous records were reviewed prior to todays visit as well as outside records available to me. Pt is  with her daughter who supplements the history.  Patient has seen Dr. Melvyn Novas since last visit.  I have spoken with him about the case.  Patient was diagnosed with dementia. Pt states that thinking is "fine."  Family states word finding trouble.   Bathing/dressing/toileting is assisted as are meds.  Daughter doesn't think that she always tells people she needs to go to the bathroom and she has fallen in transfers.    Current prescribed movement disorder medications: Carbidopa/levodopa 25/100, 2/1/1   PREVIOUS MEDICATIONS: Sinemet  ALLERGIES:   Allergies  Allergen Reactions  . Crestor [Rosuvastatin Calcium] Other (See Comments)    Feeling very bad, aching all over.  . Erythromycin Hives  . Gabapentin Other (See Comments)    Blurred vision  . Pregabalin Other (See Comments)    Crossed vision.  . Pregabalin Other (See Comments)    Crossed vision. Unknown   . Macrobid [Nitrofurantoin] Hives  . Losartan Itching    CURRENT MEDICATIONS:  Outpatient Encounter Medications as of 06/14/2020  Medication Sig  . acetaminophen (TYLENOL) 500 MG tablet Take 500 mg by mouth every 6 (six) hours as needed for mild pain.  Marland Kitchen amLODipine (NORVASC) 2.5 MG tablet TAKE 1 TABLET BY MOUTH ONCE DAILY  . aspirin EC 81 MG tablet Take 81 mg by mouth daily. Swallow whole.  Marland Kitchen atorvastatin (LIPITOR) 10 MG tablet TAKE 1 TABLET BY MOUTH ONCE DAILY  . carbidopa-levodopa (SINEMET IR) 25-100 MG tablet Take 1 tablet by mouth See admin instructions. Take 2  tablets at 7am/1 at 11am/1 at 4pm (Patient taking differently: Take 1 tablet by mouth in the morning and at bedtime. Take 2 tabs at 7am and 1 tab at 11am and 4pm)  . Cranberry 400 MG CAPS Take 1 capsule by mouth daily.  Marland Kitchen HYDROcodone-acetaminophen (NORCO/VICODIN) 5-325 MG tablet Take 1 tablet by mouth every 6 (six) hours as needed for moderate pain.  Marland Kitchen ketoconazole (NIZORAL) 2 % cream Apply 1 application topically daily.  Marland Kitchen lactobacillus acidophilus (BACID) TABS tablet Take  1 tablet by mouth 2 (two) times daily.  Marland Kitchen levothyroxine (SYNTHROID) 150 MCG tablet Take 150 mcg by mouth daily before breakfast.  . Magnesium 400 MG TABS Take 1 tablet by mouth daily.  . metFORMIN (GLUCOPHAGE) 500 MG tablet Take 500 mg by mouth 2 (two) times daily with a meal.  . ondansetron (ZOFRAN) 4 MG tablet Take 4 mg by mouth every 6 (six) hours as needed for nausea or vomiting.  . venlafaxine XR (EFFEXOR-XR) 150 MG 24 hr capsule Take 1 capsule (150 mg total) by mouth daily with breakfast.  . WARFARIN SODIUM PO Take by mouth daily. Take as directed  . [DISCONTINUED] senna-docusate (SENOKOT-S) 8.6-50 MG tablet Take 1 tablet by mouth 3 (three) times daily. (Patient not taking: Reported on 06/14/2020)   No facility-administered encounter medications on file as of 06/14/2020.    Objective:   PHYSICAL EXAMINATION:    VITALS:   Vitals:   06/14/20 1122  BP: 116/70  Pulse: 93  SpO2: 97%  Height: 5\' 6"  (1.676 m)    GEN:  The patient appears stated age and is in NAD. HEENT:  Normocephalic, atraumatic.  The mucous membranes are moist.   Neurological examination:  Orientation: The patient is alert and oriented to person/place.  Looks to family for finer aspects Cranial nerves: There is good facial symmetry with facial hypomimia. The speech is fluent and clear. Soft palate rises symmetrically and there is no tongue deviation. Hearing is intact to conversational tone. Sensation: Sensation is intact to light touch throughout Motor: Strength is at least antigravity x4.  Movement examination: Tone: There is mild increased tone in the [place].  Is also paratonia Abnormal movements: none Coordination:  There is slowness with all RAMs and difficulty assessing decremation because of apraxia Gait and Station: not tested  I have reviewed and interpreted the following labs independently    Chemistry      Component Value Date/Time   NA 138 06/09/2020 1132   NA 145 12/01/2016 0000   K 4.4  06/09/2020 1132   CL 99 06/09/2020 1132   CO2 29 06/09/2020 1132   BUN 10 06/09/2020 1132   BUN 10 12/01/2016 0000   CREATININE 0.75 06/09/2020 1132   CREATININE 0.63 11/03/2019 1352   GLU 106 12/01/2016 0000      Component Value Date/Time   CALCIUM 9.6 06/09/2020 1132   ALKPHOS 96 01/06/2019 0612   AST 13 11/03/2019 1352   ALT 5 (L) 11/03/2019 1352   BILITOT 0.3 11/03/2019 1352       Lab Results  Component Value Date   WBC 8.8 11/03/2019   HGB 14.3 11/03/2019   HCT 43.2 11/03/2019   MCV 85.7 11/03/2019   PLT 340 11/03/2019    Lab Results  Component Value Date   TSH 4.19 06/09/2020     Total time spent on today's visit was 40 minutes, including both face-to-face time and nonface-to-face time.  Time included that spent on review of records (prior notes available  to me/labs/imaging if pertinent), discussing treatment and goals, answering patient's questions and coordinating care.  Cc:  [name], [name], MD

## 2020-06-14 ENCOUNTER — Other Ambulatory Visit: Payer: Self-pay

## 2020-06-14 ENCOUNTER — Encounter: Payer: Self-pay | Admitting: Neurology

## 2020-06-14 ENCOUNTER — Ambulatory Visit (INDEPENDENT_AMBULATORY_CARE_PROVIDER_SITE_OTHER): Payer: Medicare Other | Admitting: Neurology

## 2020-06-14 VITALS — BP 116/70 | HR 93 | Ht 66.0 in

## 2020-06-14 DIAGNOSIS — F0390 Unspecified dementia without behavioral disturbance: Secondary | ICD-10-CM

## 2020-06-14 DIAGNOSIS — G20 Parkinson's disease: Secondary | ICD-10-CM | POA: Diagnosis not present

## 2020-06-14 DIAGNOSIS — G20C Parkinsonism, unspecified: Secondary | ICD-10-CM

## 2020-06-14 MED ORDER — DONEPEZIL HCL 10 MG PO TABS: 10.0000 mg | ORAL_TABLET | Freq: Every day | ORAL | 1 refills | Status: AC

## 2020-06-14 MED ORDER — DONEPEZIL HCL 5 MG PO TABS: 5.0000 mg | ORAL_TABLET | Freq: Every day | ORAL | 0 refills | Status: DC

## 2020-06-17 ENCOUNTER — Telehealth: Payer: Self-pay | Admitting: Licensed Clinical Social Worker

## 2020-06-17 NOTE — Telephone Encounter (Signed)
T/C to daughter who serves as Economist.  [name] [name] .  Daughter discussed strains of being a caregiver and not much family support.  Questions about long term care and options.  Provide brief information and resources including elder law firm and encouraged options and goals of reducing stress .

## 2020-07-06 ENCOUNTER — Other Ambulatory Visit (HOSPITAL_BASED_OUTPATIENT_CLINIC_OR_DEPARTMENT_OTHER): Payer: Medicare Other

## 2020-12-09 ENCOUNTER — Ambulatory Visit: Payer: Medicare Other | Admitting: Family Medicine

## 2020-12-17 NOTE — Progress Notes (Signed)
Assessment/Plan:   1.  Atypical parkinsonism, likely MSA-C  -Patient understands differences between [place] and Parkinson's disease.  Understands that there is an increased risk of falls, aspiration as subsequent morbidity and mortality from this disease compared to Parkinson's disease.  We have asked her for a long time to stay seated and not walk because of recurrent falls and reiterated this today.  This includes getting assist for transfers.  -needs full time care at least while awake.  We have discussed this for years now and had the discussion again today.  Discussed her largely increased risk for morbidity and mortality because of the fact that she lives independently.  Even today, she had some bowel incontinence in the office, and my office staff had to assist with cleaning.  While that is certainly not a problem for [name], it is more evidence that the patient needs higher level care.  -pt is DNR  -continue carbidopa/levodopa 25/100 2/1/1 for now  2.  Dementia  -This was confirmed by neurocognitive testing in December, 2021 with Dr. Melvyn Novas.  -Unclear if patient is still on donepezil.  It was started last visit, but patient does not think she is on it.  Son is going to check with the daughter who does the medications.  If so, she will should be on 10 mg daily.   3.  Cervical dystonia, secondary to #1  -Have done Botox in the past.  Have decided not to do this again because of inconsistency with showing up and the fact that Botox does not work this way (multiple no-shows and cancellations) Subjective:   [name] [name] was seen today in follow up for atypical parkinsonism.  My previous records were reviewed prior to todays visit.  She has seen no other physicians since our last visit.  Pt is with her son who supplements the history.  Patients daughter did speak briefly to our social worker since last visit regarding the fact that patient needed full-time care everywhere support.  She was given  information regarding elder law for care options.  Patient continues to live alone.  She is in assisted living, independently.  She doesn't think that she is on the donepezil.  It doesn't sound familiar to her but the daughter does the medications.    Current prescribed movement disorder medications: Carbidopa/levodopa 25/100, 2/1/1 Donepezil, 10 mg daily (initiated last visit)  PREVIOUS MEDICATIONS: Sinemet  ALLERGIES:   Allergies  Allergen Reactions   Crestor [Rosuvastatin Calcium] Other (See Comments)    Feeling very bad, aching all over.   Erythromycin Hives   Gabapentin Other (See Comments)    Blurred vision   Pregabalin Other (See Comments)    Crossed vision.   Pregabalin Other (See Comments)    Crossed vision. Unknown    Macrobid [Nitrofurantoin] Hives   Losartan Itching    CURRENT MEDICATIONS:  Outpatient Encounter Medications as of 12/20/2020  Medication Sig   acetaminophen (TYLENOL) 500 MG tablet Take 500 mg by mouth every 6 (six) hours as needed for mild pain.   amLODipine (NORVASC) 2.5 MG tablet TAKE 1 TABLET BY MOUTH ONCE DAILY   aspirin EC 81 MG tablet Take 81 mg by mouth daily. Swallow whole.   atorvastatin (LIPITOR) 10 MG tablet TAKE 1 TABLET BY MOUTH ONCE DAILY   Cranberry 400 MG CAPS Take 1 capsule by mouth daily.   donepezil (ARICEPT) 10 MG tablet Take 1 tablet (10 mg total) by mouth at bedtime.   HYDROcodone-acetaminophen (NORCO/VICODIN) 5-325 MG tablet  Take 1 tablet by mouth every 6 (six) hours as needed for moderate pain.   ketoconazole (NIZORAL) 2 % cream Apply 1 application topically daily.   lactobacillus acidophilus (BACID) TABS tablet Take 1 tablet by mouth 2 (two) times daily.   levothyroxine (SYNTHROID) 150 MCG tablet Take 150 mcg by mouth daily before breakfast.   Magnesium 400 MG TABS Take 1 tablet by mouth daily.   metFORMIN (GLUCOPHAGE) 500 MG tablet Take 500 mg by mouth 2 (two) times daily with a meal.   ondansetron (ZOFRAN) 4 MG tablet Take  4 mg by mouth every 6 (six) hours as needed for nausea or vomiting.   venlafaxine XR (EFFEXOR-XR) 150 MG 24 hr capsule Take 1 capsule (150 mg total) by mouth daily with breakfast.   WARFARIN SODIUM PO Take by mouth daily. Take as directed   [DISCONTINUED] carbidopa-levodopa (SINEMET IR) 25-100 MG tablet Take 1 tablet by mouth See admin instructions. Take 2 tablets at 7am/1 at 11am/1 at 4pm (Patient taking differently: Take 1 tablet by mouth in the morning and at bedtime. Take 2 tabs at 7am and 1 tab at 11am and 4pm)   carbidopa-levodopa (SINEMET IR) 25-100 MG tablet Take 1 tablet by mouth See admin instructions. Take 2 tablets at 7am/1 at 11am/1 at 4pm   donepezil (ARICEPT) 5 MG tablet Take 1 tablet (5 mg total) by mouth at bedtime.   No facility-administered encounter medications on file as of 12/20/2020.    Objective:   PHYSICAL EXAMINATION:    VITALS:   Vitals:   12/20/20 1102  BP: 123/68  Pulse: 78  SpO2: 98%  Weight: 183 lb (83 kg)  Height: 5\' 5"  (1.651 m)     GEN:  The patient appears stated age and is in NAD. HEENT:  Normocephalic, atraumatic.  The mucous membranes are moist.   Neurological examination:  Orientation: The patient is alert and oriented to person/place.   Cranial nerves: There is good facial symmetry with facial hypomimia. The speech is fluent and clear. Soft palate rises symmetrically and there is no tongue deviation. Hearing is intact to conversational tone. Sensation: Sensation is intact to light touch throughout Motor: Strength is at least antigravity x4.  Movement examination: Tone: There is normal tone today. Abnormal movements: none Coordination:  There is slowness with all RAMs and difficulty assessing decremation because of apraxia.  The left is worse than the right. Gait and Station: not tested  I have reviewed and interpreted the following labs independently    Chemistry      Component Value Date/Time   NA 138 06/09/2020 1132   NA 145  12/01/2016 0000   K 4.4 06/09/2020 1132   CL 99 06/09/2020 1132   CO2 29 06/09/2020 1132   BUN 10 06/09/2020 1132   BUN 10 12/01/2016 0000   CREATININE 0.75 06/09/2020 1132   CREATININE 0.63 11/03/2019 1352   GLU 106 12/01/2016 0000      Component Value Date/Time   CALCIUM 9.6 06/09/2020 1132   ALKPHOS 96 01/06/2019 0612   AST 13 11/03/2019 1352   ALT 5 (L) 11/03/2019 1352   BILITOT 0.3 11/03/2019 1352       Lab Results  Component Value Date   WBC 8.8 11/03/2019   HGB 14.3 11/03/2019   HCT 43.2 11/03/2019   MCV 85.7 11/03/2019   PLT 340 11/03/2019    Lab Results  Component Value Date   TSH 4.19 06/09/2020     Total time spent on today's visit  was 20 minutes, including both face-to-face time and nonface-to-face time.  Time included that spent on review of records (prior notes available to me/labs/imaging if pertinent), discussing treatment and goals, answering patient's questions and coordinating care.  Cc:  [name], [name], MD

## 2020-12-20 ENCOUNTER — Encounter: Payer: Self-pay | Admitting: Neurology

## 2020-12-20 ENCOUNTER — Ambulatory Visit (INDEPENDENT_AMBULATORY_CARE_PROVIDER_SITE_OTHER): Payer: Medicare Other | Admitting: Neurology

## 2020-12-20 ENCOUNTER — Other Ambulatory Visit: Payer: Self-pay

## 2020-12-20 VITALS — BP 123/68 | HR 78 | Ht 65.0 in | Wt 183.0 lb

## 2020-12-20 DIAGNOSIS — G238 Other specified degenerative diseases of basal ganglia: Secondary | ICD-10-CM | POA: Diagnosis not present

## 2020-12-20 MED ORDER — CARBIDOPA-LEVODOPA 25-100 MG PO TABS: 1.0000 | ORAL_TABLET | ORAL | 1 refills | Status: DC

## 2020-12-20 NOTE — Patient Instructions (Addendum)
Check on your donepezil.  We started that last visit but you didn't think that you are on it now.  If not, did you have a side effect?  Check with your daughter, [name].    The physicians and staff at [facility] are committed to providing excellent care. You may receive a survey requesting feedback about your experience at our office. We strive to receive "very good" responses to the survey questions. If you feel that your experience would prevent you from giving the office a "very good " response, please contact our office to try to remedy the situation. We may be reached at [phone]. Thank you for taking the time out of your busy day to complete the survey.

## 2020-12-27 NOTE — Progress Notes (Addendum)
[facility] [address], [place], [place] [address] [phone]  Date:  12/29/2020   Name:  [name] [name]   DOB:  [birth year]   MRN:  [id number]  PCP:  Darreld Mclean, MD    Chief Complaint: 6 month follow up (Concerns/ questions: none/Flu shot today: received already)   History of Present Illness:  [name] [name] is a 75 y.o. very pleasant female patient who presents with the following:  [name] [name] is seen today for periodic follow-up visit Most recent visit with myself in April of this year She has a somewhat complex medical history, including Parkinson's disease, recurrent UTI, pulmonary embolism due to hypercoagulable state on chronic Coumadin, gait disturbance, TIA, hypertension  She lives at an assisted living facility, [facility] She is seen there by the nurse practitioner who is monitoring her diabetes as well as her INR They do offer various levels of care at her facility and they help with bathing, dressing, admin her meds She has a private room with bathroom   She was seen by her neurologist, Dr. [name] last week 1.  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  Understands that there is an increased risk of falls, aspiration as subsequent morbidity and mortality from this disease compared to Parkinson's disease.  We have asked her for a long time to stay seated and not walk because of recurrent falls and reiterated this today.  This includes getting assist for transfers.             -needs full time care at least while awake.  We have discussed this for years now and had the discussion again today.  Discussed her largely increased risk for morbidity and mortality because of the fact that she lives independently.  Even today, she had some bowel incontinence in the office, and my office staff had to assist with cleaning.  While that is  certainly not a problem for [name] [name], it is more evidence that the patient needs higher level care.             -pt is DNR             -continue carbidopa/levodopa 25/100 2/1/1 for now 2.  Dementia             -This was confirmed by neurocognitive testing in December, 2021 with Dr. Melvyn Novas.             -Unclear if patient is still on donepezil.  It was started last visit, but patient does not think she is on it.  Son is going to check with the daughter who does the medications.  If so, she will should be on 10 mg daily. 3.  Cervical dystonia, secondary to #1             -Have done Botox in the past.  Have decided not to do this again because of inconsistency with showing up and the fact that Botox does not work this way (multiple no-shows and cancellations)   Can do full blood work today  COVID booster-recommended Flu shot- done already  Shingles vaccine Bone density scan- pt declines  Mammogram is due- pt declines   She is taking aricept  [name] is here today  She notes that her mom is having fewer falls and fewer UTI She needs help with  transfers, she uses a WC most of the time Her daughter notes she does not walk much with a walker-she may walk when doing physical therapy but otherwise she is in her wheelchair.  She has checked for skin breakdown and bathed 3 days a week Monitor notes her mom had an A1c in the fives within the last month or so, we do not need to repeat this today  [name] notes a good appetite, she seems to be in good spirits.  She states no other concerns today Patient Active Problem List   Diagnosis Date Noted   Major neurocognitive disorder 35/32/9924   Complicated UTI (urinary tract infection) 01/03/2019   Subtherapeutic international normalized ratio (INR) 01/03/2019   Hyponatremia 01/03/2019   Adenoma of right adrenal gland 02/20/2018   Osteopenia 12/18/2017   Glaucoma 10/13/2017   History of TIA (transient ischemic attack) 10/13/2017   History of  prediabetes 10/13/2017   Recurrent UTI 11/23/2016   Fibromyalgia 11/23/2016   Pulmonary hypertension 11/23/2016   Left ventricular diastolic dysfunction 26/83/4196   History of pulmonary embolus (PE) 2/2 hypercoagulable state 11/23/2016   History of sepsis 09/17/2016   SIRS (systemic inflammatory response syndrome) 04/29/2016   Hyperlipidemia 04/29/2016   Hypothyroidism 04/29/2016   Hematuria 04/05/2016   Benign essential hypertension    Gait disturbance 12/17/2015   Trochanteric bursitis of both hips 09/13/2015   Chronic low back pain 06/07/2015   Atypical parkinsonism state 05/17/2015   S/P right TKA 05/22/2011    Past Medical History:  Diagnosis Date   Acute metabolic encephalopathy 22/29/7989   Adenoma of right adrenal gland 02/20/2018   AKI (acute kidney injury) 02/12/2017   Arthritis    Atypical parkinsonism state 05/17/2015   Benign essential hypertension    Complication of anesthesia    severe itching night of surgery requiring meds- also couldnt swallow- throat "paralyzed""   Displaced fracture of fifth cervical vertebra 12/17/2015   Fibromyalgia    Gait disturbance 12/17/2015   Glaucoma 10/13/2017   Hematuria 04/05/2016   History of prediabetes 10/13/2017   History of pulmonary embolus (PE) 2/2 hypercoagulable state 11/23/2016   History of sepsis 09/17/2016   History of TIA (transient ischemic attack) 10/13/2017   Hypercholesterolemia    Hyperlipidemia    Hyponatremia 01/03/2019   Hypothyroidism    s/p graves disease   Kidney stone    Left ventricular diastolic dysfunction 21/19/4174   Major neurocognitive disorder 02/23/2020   Osteopenia 12/18/2017   Peripheral vascular disease    PULMONARY EMBOLUS x 2- 2011, 2012/ FOLLOWED BY DR [id number] 12/12 EPIC   PT STAES WILL STOP COUMADIN 3/12 and begin LOVONOX 05/17/11 as previously instructed   Pulmonary hypertension 11/23/2016   Recurrent UTI 11/23/2016   S/P right TKA 05/22/2011   Sinus infection    SIRS  (systemic inflammatory response syndrome) 04/29/2016   Subtherapeutic international normalized ratio (INR) 01/03/2019   Trochanteric bursitis of both hips 09/13/2015    Past Surgical History:  Procedure Laterality Date   ABDOMINAL HYSTERECTOMY     APPENDECTOMY     CATARACT EXTRACTION Bilateral    CHOLECYSTECTOMY     COLONOSCOPY     EXPLORATORY LAPAROTOMY     JOINT REPLACEMENT     left knee  7/12   TOTAL KNEE ARTHROPLASTY  05/22/2011   Procedure: TOTAL KNEE ARTHROPLASTY;  Surgeon: Mauri Pole, MD;  Location: [facility];  Service: Orthopedics;  Laterality: Right;    Social History   Tobacco Use   Smoking status: Former  Packs/day: 1.00    Types: Cigarettes    Quit date: 05/10/1989    Years since quitting: 31.6   Smokeless tobacco: Never  Vaping Use   Vaping Use: Never used  Substance Use Topics   Alcohol use: Not Currently   Drug use: No    Family History  Problem Relation Age of Onset   Prostate cancer Father    Stroke Father    Memory loss Sister     Allergies  Allergen Reactions   Crestor [Rosuvastatin Calcium] Other (See Comments)    Feeling very bad, aching all over.   Erythromycin Hives   Gabapentin Other (See Comments)    Blurred vision   Pregabalin Other (See Comments)    Crossed vision.   Pregabalin Other (See Comments)    Crossed vision. Unknown    Macrobid [Nitrofurantoin] Hives   Losartan Itching    Medication list has been reviewed and updated.  Current Outpatient Medications on File Prior to Visit  Medication Sig Dispense Refill   acetaminophen (TYLENOL) 500 MG tablet Take 500 mg by mouth every 6 (six) hours as needed for mild pain.     amLODipine (NORVASC) 2.5 MG tablet TAKE 1 TABLET BY MOUTH ONCE DAILY 30 tablet 3   aspirin EC 81 MG tablet Take 81 mg by mouth daily. Swallow whole.     atorvastatin (LIPITOR) 10 MG tablet TAKE 1 TABLET BY MOUTH ONCE DAILY 30 tablet 3   carbidopa-levodopa (SINEMET IR) 25-100 MG tablet Take 1 tablet by mouth  See admin instructions. Take 2 tablets at 7am/1 at 11am/1 at 4pm 360 tablet 1   Cranberry 400 MG CAPS Take 1 capsule by mouth daily.     donepezil (ARICEPT) 10 MG tablet Take 1 tablet (10 mg total) by mouth at bedtime. 90 tablet 1   HYDROcodone-acetaminophen (NORCO/VICODIN) 5-325 MG tablet Take 1 tablet by mouth every 6 (six) hours as needed for moderate pain.     ketoconazole (NIZORAL) 2 % cream Apply 1 application topically daily.     lactobacillus acidophilus (BACID) TABS tablet Take 1 tablet by mouth 2 (two) times daily.     levothyroxine (SYNTHROID) 150 MCG tablet Take 150 mcg by mouth daily before breakfast.     Magnesium 400 MG TABS Take 1 tablet by mouth daily.     metFORMIN (GLUCOPHAGE) 1000 MG tablet Take 1,000 mg by mouth 2 (two) times daily.     metFORMIN (GLUCOPHAGE) 500 MG tablet Take 500 mg by mouth 2 (two) times daily with a meal.     ondansetron (ZOFRAN) 4 MG tablet Take 4 mg by mouth every 6 (six) hours as needed for nausea or vomiting.     WARFARIN SODIUM PO Take by mouth daily. Take as directed     QUEtiapine (SEROQUEL) 25 MG tablet Take 25 mg by mouth at bedtime.     sertraline (ZOLOFT) 100 MG tablet Take 100 mg by mouth daily.     warfarin (COUMADIN) 1 MG tablet Take by mouth.     warfarin (COUMADIN) 3 MG tablet Take 3 mg by mouth daily.     No current facility-administered medications on file prior to visit.    Review of Systems:  As per HPI- otherwise negative.   Physical Examination: Vitals:   12/29/20 1100  BP: 132/78  Pulse: 82  Resp: 18  Temp: 98.2 F (36.8 C)  SpO2: 98%   Vitals:   12/29/20 1100  Weight: 183 lb (83 kg)   Body mass index is  30.45 kg/m. Ideal Body Weight:    GEN: no acute distress.  Overweight, seated in wheelchair.  Quiet but will answer questions, looks her normal self HEENT: Atraumatic, Normocephalic.  Ears and Nose: No external deformity. CV: RRR, No M/G/R. No JVD. No thrill. No extra heart sounds. PULM: CTA B, no wheezes,  crackles, rhonchi. No retractions. No resp. distress. No accessory muscle use. ABD: S, NT, ND, +BS EXTR: No c/c/e PSYCH: Normally interactive. Conversant.  Pickers nodules on both arms -do not appear infected  Assessment and Plan: Essential hypertension - Plan: CBC, Comprehensive metabolic panel  Mixed hyperlipidemia - Plan: Lipid panel  Parkinson disease ([place])  Hypothyroidism, unspecified type - Plan: TSH  Controlled type 2 diabetes mellitus without complication, without long-term current use of insulin (HCC)  Fatigue, unspecified type - Plan: TSH  Skin picking habit  Lailanie is seen today for follow-up.  Her blood pressures under good control.  She is compliant with her medications for dementia and also Parkinson disease. Her daughter feels that her current living facility is safe and providing good care Will plan further follow- up pending labs. She is picking her skin in both arms, this is a long-term habit.  I encouraged Aeliana to try her best not to do this.  Also suggested providing her with some fidget toys to occupy her hands Flu shot is done, recommended latest COVID booster Assuming all is well, visiting 6 months   Signed Lamar Blinks, MD  Addendum 10/27, received her labs as below All looks good except her thyroid replacement dose is a bit too high She is currently taking 150 mcg-would like to back down to 125  We will touch base with her daughter about how to make this adjustment, medications are being administered through her assisted living facility Results for orders placed or performed in visit on 12/29/20  CBC  Result Value Ref Range   WBC 9.8 4.0 - 10.5 K/uL   RBC 4.99 3.87 - 5.11 Mil/uL   Platelets 328.0 150.0 - 400.0 K/uL   Hemoglobin 13.2 12.0 - 15.0 g/dL   HCT 42.0 36.0 - 46.0 %   MCV 84.1 78.0 - 100.0 fl   MCHC 31.5 30.0 - 36.0 g/dL   RDW 14.9 11.5 - 15.5 %  Comprehensive metabolic panel  Result Value Ref Range   Sodium 143 135 - 145 mEq/L    Potassium 4.7 3.5 - 5.1 mEq/L   Chloride 105 96 - 112 mEq/L   CO2 32 19 - 32 mEq/L   Glucose, Bld 91 70 - 99 mg/dL   BUN 11 6 - 23 mg/dL   Creatinine, Ser 0.66 0.40 - 1.20 mg/dL   Total Bilirubin 0.4 0.2 - 1.2 mg/dL   Alkaline Phosphatase 100 39 - 117 U/L   AST 16 0 - 37 U/L   ALT 7 0 - 35 U/L   Total Protein 6.5 6.0 - 8.3 g/dL   Albumin 3.7 3.5 - 5.2 g/dL   GFR 84.38 >60.00 mL/min   Calcium 9.5 8.4 - 10.5 mg/dL  Lipid panel  Result Value Ref Range   Cholesterol 125 0 - 200 mg/dL   Triglycerides 183.0 (H) 0.0 - 149.0 mg/dL   HDL 40.90 >39.00 mg/dL   VLDL 36.6 0.0 - 40.0 mg/dL   LDL Cholesterol 47 0 - 99 mg/dL   Total CHOL/HDL Ratio 3    NonHDL 83.69   TSH  Result Value Ref Range   TSH 0.26 (L) 0.35 - 5.50 uIU/mL

## 2020-12-27 NOTE — Patient Instructions (Addendum)
It was nice to see you again today, as always-I will be in touch with your blood work  Please apply a thick moisturizer to picked areas on arms. Consider getting some fidget toys to help minimize urge to pick  Otherwise you look good today!  Please see me in about 6 months   Please get the latest covid booster asap

## 2020-12-29 ENCOUNTER — Other Ambulatory Visit: Payer: Self-pay

## 2020-12-29 ENCOUNTER — Ambulatory Visit (INDEPENDENT_AMBULATORY_CARE_PROVIDER_SITE_OTHER): Payer: Medicare Other | Admitting: Family Medicine

## 2020-12-29 VITALS — BP 132/78 | HR 82 | Temp 98.2°F | Resp 18 | Wt 183.0 lb

## 2020-12-29 DIAGNOSIS — E039 Hypothyroidism, unspecified: Secondary | ICD-10-CM | POA: Diagnosis not present

## 2020-12-29 DIAGNOSIS — E119 Type 2 diabetes mellitus without complications: Secondary | ICD-10-CM

## 2020-12-29 DIAGNOSIS — I10 Essential (primary) hypertension: Secondary | ICD-10-CM

## 2020-12-29 DIAGNOSIS — R5383 Other fatigue: Secondary | ICD-10-CM | POA: Diagnosis not present

## 2020-12-29 DIAGNOSIS — G20 Parkinson's disease: Secondary | ICD-10-CM

## 2020-12-29 DIAGNOSIS — E782 Mixed hyperlipidemia: Secondary | ICD-10-CM

## 2020-12-29 DIAGNOSIS — F424 Excoriation (skin-picking) disorder: Secondary | ICD-10-CM

## 2020-12-29 LAB — COMPREHENSIVE METABOLIC PANEL
ALT: 7 U/L (ref 0–35)
AST: 16 U/L (ref 0–37)
Albumin: 3.7 g/dL (ref 3.5–5.2)
Alkaline Phosphatase: 100 U/L (ref 39–117)
BUN: 11 mg/dL (ref 6–23)
CO2: 32 mEq/L (ref 19–32)
Calcium: 9.5 mg/dL (ref 8.4–10.5)
Chloride: 105 mEq/L (ref 96–112)
Creatinine, Ser: 0.66 mg/dL (ref 0.40–1.20)
GFR: 84.38 mL/min (ref >60.00)
Glucose, Bld: 91 mg/dL (ref 70–99)
Potassium: 4.7 mEq/L (ref 3.5–5.1)
Sodium: 143 mEq/L (ref 135–145)
Total Bilirubin: 0.4 mg/dL (ref 0.2–1.2)
Total Protein: 6.5 g/dL (ref 6.0–8.3)

## 2020-12-29 LAB — CBC
HCT: 42 % (ref 36.0–46.0)
Hemoglobin: 13.2 g/dL (ref 12.0–15.0)
MCHC: 31.5 g/dL (ref 30.0–36.0)
MCV: 84.1 fl (ref 78.0–100.0)
Platelets: 328 10*3/uL (ref 150.0–400.0)
RBC: 4.99 Mil/uL (ref 3.87–5.11)
RDW: 14.9 % (ref 11.5–15.5)
WBC: 9.8 10*3/uL (ref 4.0–10.5)

## 2020-12-29 LAB — LIPID PANEL
Cholesterol: 125 mg/dL (ref 0–200)
HDL: 40.9 mg/dL (ref >39.00)
LDL Cholesterol: 47 mg/dL (ref 0–99)
NonHDL: 83.69
Total CHOL/HDL Ratio: 3
Triglycerides: 183 mg/dL — ABNORMAL HIGH (ref 0.0–149.0)
VLDL: 36.6 mg/dL (ref 0.0–40.0)

## 2020-12-29 LAB — TSH: TSH: 0.26 u[IU]/mL — ABNORMAL LOW (ref 0.35–5.50)

## 2020-12-30 ENCOUNTER — Encounter: Payer: Self-pay | Admitting: Family Medicine

## 2021-06-27 NOTE — Progress Notes (Signed)
? ?Virtual Visit Via Video  ? ?The purpose of this virtual visit is to provide medical care while limiting exposure to the novel coronavirus.   ? ?Consent was obtained for video visit:  Yes.   ?Answered questions that patient had about telehealth interaction:  Yes.   ?I discussed the limitations, risks, security and privacy concerns of performing an evaluation and management service by telemedicine. I also discussed with the patient that there may be a patient responsible charge related to this service. The patient expressed understanding and agreed to proceed. ? ?Pt location: Home ?Physician Location: office ?Name of referring provider:  [name], [name], MD ?I connected with [name] at patients initiation/request on 06/28/2021 at 11:15 AM EDT by video enabled telemedicine application and verified that I am speaking with the correct person using two identifiers. ?Pt MRN:  [id number] ?Pt DOB:  1945-07-27 ?Video Participants:  Sonja Manseau;  daughter supplements hx ? ?Assessment/Plan:  ? ?1.  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 -Patient understands differences between [place] and Parkinson's disease.  Understands that there is an increased risk of falls, aspiration as subsequent morbidity and mortality from this disease compared to Parkinson's disease.  We have asked her for a long time to stay seated and not walk because of recurrent falls and reiterated this today.  This includes getting assist for transfers. ? -Patient needs full-time care, at least while awake. ? -pt is DNR ? -continue carbidopa/levodopa 25/100 2/1/1 for now.  They are to call me back and let me know the pharmacy.  I will then send in a refill. ? ?2.  Dementia ? -This was confirmed by neurocognitive testing in December, 2021 with Dr. Melvyn Novas. ? -Continue donepezil, 10 mg daily. ? ? ?3.  Cervical dystonia, secondary to #1 ? -Have done Botox in the past.  Have decided not to do this again because of inconsistency with showing up and the fact that  Botox does not work this way (multiple no-shows and cancellations) ? ?4.  F/u 6-8 months ?Subjective:  ? ?Donnae Michels was seen today in follow up for atypical parkinsonism.  My previous records were reviewed prior to todays visit.  She has seen no other physicians since our last visit.  Pt is with her daughter who supplements the history.  Patient continues to live alone, despite multiple discussions.  PCP records indicate that "daughter feels her current living situation is safe and providing good care."  January 30 patient was in the emergency room with another fall.  Emergency room records indicate "patient has dementia and does not remember when the fall happened.  Unsure if she hit her head."  Records also indicate that CNA was moving her from wheelchair to bed when CNA dropped her, but CNA did not report the incident until days later.  CT brain nonacute.  No other falls.  No hallucinations.  Doing PT for her knee right now 1 time per day.  No diplopia.  No swallow trouble.  Good appetite.   ? ?Current prescribed movement disorder medications: ?Carbidopa/levodopa 25/100, 2/1/1 - they aren't sure of timing of meds b/c nursing staff not there ?Donepezil, 10 mg daily  ? ?PREVIOUS MEDICATIONS: Sinemet ? ?ALLERGIES:   ?Allergies  ?Allergen Reactions  ? Crestor [Rosuvastatin Calcium] Other (See Comments)  ?  Feeling very bad, aching all over.  ? Erythromycin Hives  ? Gabapentin Other (See Comments)  ?  Blurred vision  ? Pregabalin Other (See Comments)  ?  Crossed vision.  ? Pregabalin Other (  See Comments)  ?  Crossed vision. ?Unknown ?  ? Macrobid [Nitrofurantoin] Hives  ? Losartan Itching  ? ? ?CURRENT MEDICATIONS:  ?Outpatient Encounter Medications as of 06/28/2021  ?Medication Sig  ? acetaminophen (TYLENOL) 500 MG tablet Take 500 mg by mouth every 6 (six) hours as needed for mild pain.  ? amLODipine (NORVASC) 2.5 MG tablet TAKE 1 TABLET BY MOUTH ONCE DAILY  ? aspirin EC 81 MG tablet Take 81 mg by mouth daily.  Swallow whole.  ? atorvastatin (LIPITOR) 10 MG tablet TAKE 1 TABLET BY MOUTH ONCE DAILY  ? carbidopa-levodopa (SINEMET IR) 25-100 MG tablet Take 1 tablet by mouth See admin instructions. Take 2 tablets at 7am/1 at 11am/1 at 4pm  ? Cranberry 400 MG CAPS Take 1 capsule by mouth daily.  ? donepezil (ARICEPT) 10 MG tablet Take 1 tablet (10 mg total) by mouth at bedtime.  ? HYDROcodone-acetaminophen (NORCO/VICODIN) 5-325 MG tablet Take 1 tablet by mouth every 6 (six) hours as needed for moderate pain.  ? ketoconazole (NIZORAL) 2 % cream Apply 1 application topically daily.  ? lactobacillus acidophilus (BACID) TABS tablet Take 1 tablet by mouth 2 (two) times daily.  ? levothyroxine (SYNTHROID) 150 MCG tablet Take 150 mcg by mouth daily before breakfast.  ? Magnesium 400 MG TABS Take 1 tablet by mouth daily.  ? metFORMIN (GLUCOPHAGE) 1000 MG tablet Take 1,000 mg by mouth 2 (two) times daily.  ? metFORMIN (GLUCOPHAGE) 500 MG tablet Take 500 mg by mouth 2 (two) times daily with a meal.  ? ondansetron (ZOFRAN) 4 MG tablet Take 4 mg by mouth every 6 (six) hours as needed for nausea or vomiting.  ? QUEtiapine (SEROQUEL) 25 MG tablet Take 25 mg by mouth at bedtime.  ? sertraline (ZOLOFT) 100 MG tablet Take 100 mg by mouth daily.  ? warfarin (COUMADIN) 1 MG tablet Take by mouth.  ? warfarin (COUMADIN) 3 MG tablet Take 3 mg by mouth daily.  ? ?No facility-administered encounter medications on file as of 06/28/2021.  ? ? ?Objective:  ? ?PHYSICAL EXAMINATION:   ? ?VITALS:   ?There were no vitals filed for this visit. ? ? ? ?GEN:  The patient appears stated age and is in NAD. ?HEENT:  Normocephalic, atraumatic.  The mucous membranes are moist.  Tongue protrusion is midline. ? ?Neurological examination: ? ?Orientation: The patient is alert and oriented to person/place.   ?Cranial nerves: There is good facial symmetry with facial hypomimia.  Extraocular muscles are intact.  No upper or downgaze paresis.  The speech is fluent and clear.  Soft palate rises symmetrically and there is no tongue deviation. Hearing is intact to conversational tone. ?Sensation: Sensation is intact to light touch throughout ?Motor: Strength is at least antigravity x4. ? ?Movement examination: ?Tone: There is normal tone today. ?Abnormal movements: none ?Coordination:  There is good rapid alternating movements today, with the exception of toe taps on the right and there is decremation there. ?Gait and Station: not tested ? ?I have reviewed and interpreted the following labs independently ? ?  Chemistry   ?   ?Component Value Date/Time  ? NA 143 12/29/2020 1130  ? NA 145 12/01/2016 0000  ? K 4.7 12/29/2020 1130  ? CL 105 12/29/2020 1130  ? CO2 32 12/29/2020 1130  ? BUN 11 12/29/2020 1130  ? BUN 10 12/01/2016 0000  ? CREATININE 0.66 12/29/2020 1130  ? CREATININE 0.63 11/03/2019 1352  ? GLU 106 12/01/2016 0000  ?    ?Component  Value Date/Time  ? CALCIUM 9.5 12/29/2020 1130  ? ALKPHOS 100 12/29/2020 1130  ? AST 16 12/29/2020 1130  ? ALT 7 12/29/2020 1130  ? BILITOT 0.4 12/29/2020 1130  ?  ? ? ? ?Lab Results  ?Component Value Date  ? WBC 9.8 12/29/2020  ? HGB 13.2 12/29/2020  ? HCT 42.0 12/29/2020  ? MCV 84.1 12/29/2020  ? PLT 328.0 12/29/2020  ? ? ?Lab Results  ?Component Value Date  ? TSH 0.26 (L) 12/29/2020  ? ? ?Follow up Instructions  ? ?  ? -I discussed the assessment and treatment plan with the patient. The patient was provided an opportunity to ask questions and all were answered. The patient agreed with the plan and demonstrated an understanding of the instructions. ?  ?The patient was advised to call back or seek an in-person evaluation if the symptoms worsen or if the condition fails to improve as anticipated. ? ? ? ?Total time spent on today's visit was 40mnutes, including both face-to-face time and nonface-to-face time.  Time included that spent on review of records (prior notes available to me/labs/imaging if pertinent), discussing treatment and goals, answering  patient's questions and coordinating care. ? ? ?RAlonza Bogus DO ? ? ?Cc:  [name], JGay Filler MD ? ?

## 2021-06-28 ENCOUNTER — Encounter: Payer: Self-pay | Admitting: Neurology

## 2021-06-28 ENCOUNTER — Telehealth (INDEPENDENT_AMBULATORY_CARE_PROVIDER_SITE_OTHER): Payer: Medicare Other | Admitting: Neurology

## 2021-06-28 ENCOUNTER — Other Ambulatory Visit: Payer: Self-pay

## 2021-06-28 DIAGNOSIS — G20 Parkinson's disease: Secondary | ICD-10-CM

## 2021-06-28 DIAGNOSIS — G238 Other specified degenerative diseases of basal ganglia: Secondary | ICD-10-CM

## 2021-06-28 DIAGNOSIS — F0390 Unspecified dementia without behavioral disturbance: Secondary | ICD-10-CM

## 2021-06-28 MED ORDER — CARBIDOPA-LEVODOPA 25-100 MG PO TABS: ORAL_TABLET | ORAL | 1 refills | Status: AC

## 2022-01-13 ENCOUNTER — Encounter (HOSPITAL_COMMUNITY): Payer: Self-pay | Admitting: *Deleted

## 2022-01-13 ENCOUNTER — Emergency Department (HOSPITAL_COMMUNITY): Payer: Medicare Other

## 2022-01-13 ENCOUNTER — Inpatient Hospital Stay (HOSPITAL_COMMUNITY)
Admission: EM | Admit: 2022-01-13 | Discharge: 2022-01-17 | DRG: 871 | Disposition: A | Discharge status: Hospice | Payer: Medicare Other | Source: Skilled Nursing Facility | Attending: Internal Medicine | Admitting: Internal Medicine

## 2022-01-13 ENCOUNTER — Other Ambulatory Visit: Payer: Self-pay

## 2022-01-13 DIAGNOSIS — Z7901 Long term (current) use of anticoagulants: Secondary | ICD-10-CM

## 2022-01-13 DIAGNOSIS — Z1152 Encounter for screening for COVID-19: Secondary | ICD-10-CM | POA: Diagnosis not present

## 2022-01-13 DIAGNOSIS — Z66 Do not resuscitate: Secondary | ICD-10-CM | POA: Diagnosis present

## 2022-01-13 DIAGNOSIS — R6521 Severe sepsis with septic shock: Secondary | ICD-10-CM | POA: Diagnosis present

## 2022-01-13 DIAGNOSIS — A419 Sepsis, unspecified organism: Principal | ICD-10-CM | POA: Diagnosis present

## 2022-01-13 DIAGNOSIS — A4159 Other Gram-negative sepsis: Principal | ICD-10-CM | POA: Diagnosis present

## 2022-01-13 DIAGNOSIS — D62 Acute posthemorrhagic anemia: Secondary | ICD-10-CM | POA: Diagnosis present

## 2022-01-13 DIAGNOSIS — T45515A Adverse effect of anticoagulants, initial encounter: Secondary | ICD-10-CM | POA: Diagnosis present

## 2022-01-13 DIAGNOSIS — I739 Peripheral vascular disease, unspecified: Secondary | ICD-10-CM | POA: Diagnosis present

## 2022-01-13 DIAGNOSIS — E039 Hypothyroidism, unspecified: Secondary | ICD-10-CM | POA: Diagnosis present

## 2022-01-13 DIAGNOSIS — E46 Unspecified protein-calorie malnutrition: Secondary | ICD-10-CM | POA: Diagnosis present

## 2022-01-13 DIAGNOSIS — Z86711 Personal history of pulmonary embolism: Secondary | ICD-10-CM

## 2022-01-13 DIAGNOSIS — Z881 Allergy status to other antibiotic agents status: Secondary | ICD-10-CM

## 2022-01-13 DIAGNOSIS — I272 Pulmonary hypertension, unspecified: Secondary | ICD-10-CM | POA: Diagnosis present

## 2022-01-13 DIAGNOSIS — G20A1 Parkinson's disease without dyskinesia, without mention of fluctuations: Secondary | ICD-10-CM | POA: Diagnosis present

## 2022-01-13 DIAGNOSIS — N390 Urinary tract infection, site not specified: Secondary | ICD-10-CM | POA: Diagnosis not present

## 2022-01-13 DIAGNOSIS — G9341 Metabolic encephalopathy: Secondary | ICD-10-CM | POA: Diagnosis present

## 2022-01-13 DIAGNOSIS — E669 Obesity, unspecified: Secondary | ICD-10-CM | POA: Diagnosis not present

## 2022-01-13 DIAGNOSIS — Z8673 Personal history of transient ischemic attack (TIA), and cerebral infarction without residual deficits: Secondary | ICD-10-CM

## 2022-01-13 DIAGNOSIS — D6832 Hemorrhagic disorder due to extrinsic circulating anticoagulants: Secondary | ICD-10-CM | POA: Diagnosis present

## 2022-01-13 DIAGNOSIS — Z515 Encounter for palliative care: Secondary | ICD-10-CM

## 2022-01-13 DIAGNOSIS — F0280 Dementia in other diseases classified elsewhere without behavioral disturbance: Secondary | ICD-10-CM | POA: Diagnosis present

## 2022-01-13 DIAGNOSIS — B961 Klebsiella pneumoniae [K. pneumoniae] as the cause of diseases classified elsewhere: Secondary | ICD-10-CM | POA: Diagnosis present

## 2022-01-13 DIAGNOSIS — E870 Hyperosmolality and hypernatremia: Secondary | ICD-10-CM | POA: Diagnosis present

## 2022-01-13 DIAGNOSIS — Z711 Person with feared health complaint in whom no diagnosis is made: Secondary | ICD-10-CM | POA: Diagnosis not present

## 2022-01-13 DIAGNOSIS — J9811 Atelectasis: Secondary | ICD-10-CM | POA: Diagnosis present

## 2022-01-13 DIAGNOSIS — Z9049 Acquired absence of other specified parts of digestive tract: Secondary | ICD-10-CM

## 2022-01-13 DIAGNOSIS — I10 Essential (primary) hypertension: Secondary | ICD-10-CM | POA: Diagnosis present

## 2022-01-13 DIAGNOSIS — W19XXXA Unspecified fall, initial encounter: Secondary | ICD-10-CM | POA: Diagnosis present

## 2022-01-13 DIAGNOSIS — S20219A Contusion of unspecified front wall of thorax, initial encounter: Secondary | ICD-10-CM | POA: Diagnosis present

## 2022-01-13 DIAGNOSIS — M797 Fibromyalgia: Secondary | ICD-10-CM | POA: Diagnosis present

## 2022-01-13 DIAGNOSIS — Z9181 History of falling: Secondary | ICD-10-CM

## 2022-01-13 DIAGNOSIS — R627 Adult failure to thrive: Secondary | ICD-10-CM | POA: Diagnosis present

## 2022-01-13 DIAGNOSIS — Z96651 Presence of right artificial knee joint: Secondary | ICD-10-CM | POA: Diagnosis present

## 2022-01-13 DIAGNOSIS — N170 Acute kidney failure with tubular necrosis: Secondary | ICD-10-CM | POA: Diagnosis present

## 2022-01-13 DIAGNOSIS — Z993 Dependence on wheelchair: Secondary | ICD-10-CM

## 2022-01-13 DIAGNOSIS — H409 Unspecified glaucoma: Secondary | ICD-10-CM | POA: Diagnosis present

## 2022-01-13 DIAGNOSIS — Z7984 Long term (current) use of oral hypoglycemic drugs: Secondary | ICD-10-CM

## 2022-01-13 DIAGNOSIS — M8580 Other specified disorders of bone density and structure, unspecified site: Secondary | ICD-10-CM | POA: Diagnosis present

## 2022-01-13 DIAGNOSIS — E876 Hypokalemia: Secondary | ICD-10-CM | POA: Diagnosis present

## 2022-01-13 DIAGNOSIS — Z87440 Personal history of urinary (tract) infections: Secondary | ICD-10-CM

## 2022-01-13 DIAGNOSIS — E785 Hyperlipidemia, unspecified: Secondary | ICD-10-CM | POA: Diagnosis present

## 2022-01-13 DIAGNOSIS — R4189 Other symptoms and signs involving cognitive functions and awareness: Secondary | ICD-10-CM | POA: Diagnosis present

## 2022-01-13 DIAGNOSIS — R638 Other symptoms and signs concerning food and fluid intake: Secondary | ICD-10-CM | POA: Diagnosis not present

## 2022-01-13 DIAGNOSIS — Z8042 Family history of malignant neoplasm of prostate: Secondary | ICD-10-CM

## 2022-01-13 DIAGNOSIS — E7800 Pure hypercholesterolemia, unspecified: Secondary | ICD-10-CM | POA: Diagnosis present

## 2022-01-13 DIAGNOSIS — Z79899 Other long term (current) drug therapy: Secondary | ICD-10-CM

## 2022-01-13 DIAGNOSIS — R791 Abnormal coagulation profile: Secondary | ICD-10-CM

## 2022-01-13 DIAGNOSIS — Z6830 Body mass index (BMI) 30.0-30.9, adult: Secondary | ICD-10-CM

## 2022-01-13 DIAGNOSIS — Z789 Other specified health status: Secondary | ICD-10-CM | POA: Diagnosis not present

## 2022-01-13 DIAGNOSIS — Z7982 Long term (current) use of aspirin: Secondary | ICD-10-CM

## 2022-01-13 DIAGNOSIS — Z888 Allergy status to other drugs, medicaments and biological substances status: Secondary | ICD-10-CM

## 2022-01-13 DIAGNOSIS — Z87891 Personal history of nicotine dependence: Secondary | ICD-10-CM

## 2022-01-13 DIAGNOSIS — E860 Dehydration: Secondary | ICD-10-CM | POA: Diagnosis present

## 2022-01-13 DIAGNOSIS — Z823 Family history of stroke: Secondary | ICD-10-CM

## 2022-01-13 DIAGNOSIS — Z7189 Other specified counseling: Secondary | ICD-10-CM | POA: Diagnosis not present

## 2022-01-13 DIAGNOSIS — S20211A Contusion of right front wall of thorax, initial encounter: Secondary | ICD-10-CM | POA: Diagnosis not present

## 2022-01-13 DIAGNOSIS — E782 Mixed hyperlipidemia: Secondary | ICD-10-CM | POA: Diagnosis not present

## 2022-01-13 DIAGNOSIS — D6859 Other primary thrombophilia: Secondary | ICD-10-CM | POA: Diagnosis present

## 2022-01-13 DIAGNOSIS — Z90710 Acquired absence of both cervix and uterus: Secondary | ICD-10-CM

## 2022-01-13 DIAGNOSIS — N3000 Acute cystitis without hematuria: Secondary | ICD-10-CM

## 2022-01-13 DIAGNOSIS — Z86718 Personal history of other venous thrombosis and embolism: Secondary | ICD-10-CM

## 2022-01-13 DIAGNOSIS — N200 Calculus of kidney: Secondary | ICD-10-CM | POA: Diagnosis present

## 2022-01-13 LAB — RESPIRATORY PANEL BY PCR

## 2022-01-13 LAB — CBC WITH DIFFERENTIAL/PLATELET
Abs Immature Granulocytes: 0.61 10*3/uL — ABNORMAL HIGH (ref 0.00–0.07)
Basophils Absolute: 0.1 10*3/uL (ref 0.0–0.1)
Basophils Relative: 0 %
Eosinophils Absolute: 0 10*3/uL (ref 0.0–0.5)
Eosinophils Relative: 0 %
HCT: 31.8 % — ABNORMAL LOW (ref 36.0–46.0)
Hemoglobin: 9.8 g/dL — ABNORMAL LOW (ref 12.0–15.0)
Immature Granulocytes: 2 %
Lymphocytes Relative: 5 %
Lymphs Abs: 1.4 10*3/uL (ref 0.7–4.0)
MCH: 27.1 pg (ref 26.0–34.0)
MCHC: 30.8 g/dL (ref 30.0–36.0)
MCV: 87.8 fL (ref 80.0–100.0)
Monocytes Absolute: 1.5 10*3/uL — ABNORMAL HIGH (ref 0.1–1.0)
Monocytes Relative: 5 %
Neutro Abs: 25.2 10*3/uL — ABNORMAL HIGH (ref 1.7–7.7)
Neutrophils Relative %: 88 %
Platelets: 522 10*3/uL — ABNORMAL HIGH (ref 150–400)
RBC: 3.62 MIL/uL — ABNORMAL LOW (ref 3.87–5.11)
RDW: 14.1 % (ref 11.5–15.5)
WBC: 28.8 10*3/uL — ABNORMAL HIGH (ref 4.0–10.5)
nRBC: 0 % (ref 0.0–0.2)

## 2022-01-13 LAB — URINALYSIS, ROUTINE W REFLEX MICROSCOPIC

## 2022-01-13 LAB — RESP PANEL BY RT-PCR (FLU A&B, COVID) ARPGX2
Influenza A by PCR: NEGATIVE
Influenza B by PCR: NEGATIVE
SARS Coronavirus 2 by RT PCR: NEGATIVE

## 2022-01-13 LAB — URINALYSIS, MICROSCOPIC (REFLEX)
RBC / HPF: >50 RBC/hpf (ref 0–5)
WBC, UA: >50 WBC/hpf (ref 0–5)

## 2022-01-13 LAB — COMPREHENSIVE METABOLIC PANEL
ALT: 6 U/L (ref 0–44)
AST: 21 U/L (ref 15–41)
Albumin: 2 g/dL — ABNORMAL LOW (ref 3.5–5.0)
Alkaline Phosphatase: 119 U/L (ref 38–126)
Anion gap: 15 (ref 5–15)
BUN: 27 mg/dL — ABNORMAL HIGH (ref 8–23)
CO2: 23 mmol/L (ref 22–32)
Calcium: 8.7 mg/dL — ABNORMAL LOW (ref 8.9–10.3)
Chloride: 114 mmol/L — ABNORMAL HIGH (ref 98–111)
Creatinine, Ser: 1.24 mg/dL — ABNORMAL HIGH (ref 0.44–1.00)
GFR, Estimated: 45 mL/min — ABNORMAL LOW (ref >60)
Glucose, Bld: 214 mg/dL — ABNORMAL HIGH (ref 70–99)
Potassium: 4.2 mmol/L (ref 3.5–5.1)
Sodium: 152 mmol/L — ABNORMAL HIGH (ref 135–145)
Total Bilirubin: 0.7 mg/dL (ref 0.3–1.2)
Total Protein: 6.5 g/dL (ref 6.5–8.1)

## 2022-01-13 LAB — CBC
HCT: 25.3 % — ABNORMAL LOW (ref 36.0–46.0)
Hemoglobin: 7.7 g/dL — ABNORMAL LOW (ref 12.0–15.0)
MCH: 26.6 pg (ref 26.0–34.0)
MCHC: 30.4 g/dL (ref 30.0–36.0)
MCV: 87.2 fL (ref 80.0–100.0)
Platelets: 420 10*3/uL — ABNORMAL HIGH (ref 150–400)
RBC: 2.9 MIL/uL — ABNORMAL LOW (ref 3.87–5.11)
RDW: 14.1 % (ref 11.5–15.5)
WBC: 22.8 10*3/uL — ABNORMAL HIGH (ref 4.0–10.5)
nRBC: 0 % (ref 0.0–0.2)

## 2022-01-13 LAB — PROTIME-INR
INR: 6.4 (ref 0.8–1.2)
INR: 4.1 (ref 0.8–1.2)
Prothrombin Time: 56.1 seconds — ABNORMAL HIGH (ref 11.4–15.2)
Prothrombin Time: 39.1 seconds — ABNORMAL HIGH (ref 11.4–15.2)

## 2022-01-13 LAB — PROCALCITONIN: Procalcitonin: 4.93 ng/mL

## 2022-01-13 LAB — APTT: aPTT: 62 seconds — ABNORMAL HIGH (ref 24–36)

## 2022-01-13 LAB — LACTIC ACID, PLASMA
Lactic Acid, Venous: 5.5 mmol/L (ref 0.5–1.9)
Lactic Acid, Venous: 4.7 mmol/L (ref 0.5–1.9)

## 2022-01-13 LAB — GLUCOSE, CAPILLARY
Glucose-Capillary: 141 mg/dL — ABNORMAL HIGH (ref 70–99)
Glucose-Capillary: 146 mg/dL — ABNORMAL HIGH (ref 70–99)

## 2022-01-13 LAB — MRSA NEXT GEN BY PCR, NASAL: MRSA by PCR Next Gen: DETECTED — AB

## 2022-01-13 MED ORDER — POLYETHYLENE GLYCOL 3350 17 G PO PACK: 17.0000 g | PACK | Freq: Every day | ORAL | Status: DC | PRN

## 2022-01-13 MED ORDER — SODIUM CHLORIDE 0.9 % IV SOLN: 2.0000 g | INTRAVENOUS | Status: DC

## 2022-01-13 MED ORDER — VITAMIN K1 10 MG/ML IJ SOLN
5.0000 mg | Freq: Once | INTRAVENOUS | Status: DC
  Filled 2022-01-13: qty 0.5

## 2022-01-13 MED ORDER — DOCUSATE SODIUM 100 MG PO CAPS: 100.0000 mg | ORAL_CAPSULE | Freq: Two times a day (BID) | ORAL | Status: DC | PRN

## 2022-01-13 MED ORDER — CHLORHEXIDINE GLUCONATE CLOTH 2 % EX PADS
6.0000 | MEDICATED_PAD | Freq: Every day | CUTANEOUS | Status: DC
  Administered 2022-01-14: 6 via TOPICAL

## 2022-01-13 MED ORDER — ACETAMINOPHEN 650 MG RE SUPP
RECTAL | Status: AC
  Administered 2022-01-13: 650 mg
  Filled 2022-01-13: qty 1

## 2022-01-13 MED ORDER — LACTATED RINGERS IV SOLN: INTRAVENOUS | Status: AC

## 2022-01-13 MED ORDER — SODIUM CHLORIDE 0.9 % IV SOLN
1.0000 g | Freq: Two times a day (BID) | INTRAVENOUS | Status: DC
  Administered 2022-01-14: 1 g via INTRAVENOUS
  Filled 2022-01-13 (×2): qty 20

## 2022-01-13 MED ORDER — NOREPINEPHRINE 4 MG/250ML-% IV SOLN
2.0000 ug/min | INTRAVENOUS | Status: DC
  Administered 2022-01-13: 2 ug/min via INTRAVENOUS
  Filled 2022-01-13: qty 250

## 2022-01-13 MED ORDER — MUPIROCIN 2 % EX OINT
1.0000 | TOPICAL_OINTMENT | Freq: Two times a day (BID) | CUTANEOUS | Status: DC
  Administered 2022-01-13: 1 via NASAL
  Filled 2022-01-13: qty 22

## 2022-01-13 MED ORDER — SODIUM CHLORIDE 0.9 % IV SOLN
250.0000 mL | INTRAVENOUS | Status: DC
  Administered 2022-01-14: 250 mL via INTRAVENOUS

## 2022-01-13 MED ORDER — SODIUM CHLORIDE 0.9 % IV SOLN
1.0000 g | Freq: Once | INTRAVENOUS | Status: AC
  Administered 2022-01-13: 1 g via INTRAVENOUS
  Filled 2022-01-13: qty 20

## 2022-01-13 MED ORDER — SODIUM CHLORIDE 0.9 % IV BOLUS
1000.0000 mL | Freq: Once | INTRAVENOUS | Status: AC
  Administered 2022-01-13: 1000 mL via INTRAVENOUS

## 2022-01-13 MED ORDER — SODIUM CHLORIDE 0.9 % IV BOLUS (SEPSIS)
2000.0000 mL | Freq: Once | INTRAVENOUS | Status: AC
  Administered 2022-01-13: 2000 mL via INTRAVENOUS

## 2022-01-13 MED ORDER — VITAMIN K1 10 MG/ML IJ SOLN
4.0000 mg | Freq: Once | INTRAVENOUS | Status: AC
  Administered 2022-01-13: 4 mg via INTRAVENOUS
  Filled 2022-01-13: qty 0.4

## 2022-01-13 MED ORDER — VITAMIN K1 10 MG/ML IJ SOLN
1.0000 mg | Freq: Once | INTRAVENOUS | Status: AC
  Administered 2022-01-13: 1 mg via INTRAVENOUS
  Filled 2022-01-13: qty 0.1

## 2022-01-13 MED ORDER — ONDANSETRON HCL 4 MG/2ML IJ SOLN: 4.0000 mg | Freq: Four times a day (QID) | INTRAMUSCULAR | Status: DC | PRN

## 2022-01-13 NOTE — Sepsis Progress Note (Signed)
Notified bedside nurse of need to draw blood cultures. Reports pt is difficult stick. IVF bolus started. Await blood cx draw. Noted antibiotic change to merrem.

## 2022-01-13 NOTE — Progress Notes (Signed)
[facility] Progress Note Patient Name: [name] [name] DOB: 1945-08-21 MRN: [id number]   Date of Service  01/13/2022  HPI/Events of Note  INR 4.1 after 5 mg of Vitamin K iv x 1, INR draw is scheduled for 5 am tomorrow morning.  eICU Interventions  Vitamin K 5 mg iv x 1 scheduled to be given at 8 am tomorrow morning IF INR > 4.0 on 5 am lab draw.        Frederik Pear 01/13/2022, 8:47 PM

## 2022-01-13 NOTE — ED Provider Notes (Signed)
[facility] EMERGENCY DEPARTMENT Provider Note   CSN: [id number] Arrival date & time: 01/13/22  1258     History {Add pertinent medical, surgical, social history, OB history to HPI:1} Chief Complaint  Patient presents with   Altered Mental Status    [name] [name] is a 76 y.o. female.  Patient here with EMS with altered mental status.  Level 5 caveat due to patient being altered.  Family is at the bedside and talk with family on the phone.  Sounds like she recently being admitted for urinary tract infection.  She had stents for kidney stones.  She was called an antibiotic recently last week after following up with urology but antibiotic was never filled.  She is gotten more confused as a days have gone on.  She lives at an assisted living facility with a 24/7 aide.  She has history of dementia, Parkinson's, high cholesterol, kidney stones.  Patient usually is usually awake enough to eat but has been more sleepy and lethargic the last several days.  Patient has not been on antibiotics.  Unsure if patient is in any discomfort.  The history is provided by the patient.       Home Medications Prior to Admission medications   Medication Sig Start Date End Date Taking? Authorizing Provider  acetaminophen (TYLENOL) 500 MG tablet Take 500 mg by mouth every 6 (six) hours as needed for mild pain.    [provider]  amLODipine (NORVASC) 2.5 MG tablet TAKE 1 TABLET BY MOUTH ONCE DAILY Patient taking differently: Take 2.5 mg by mouth at bedtime. 04/23/19   [name], [name], MD  aspirin EC 81 MG tablet Take 81 mg by mouth daily. Swallow whole.    [provider]  atorvastatin (LIPITOR) 10 MG tablet TAKE 1 TABLET BY MOUTH ONCE DAILY 04/23/19   [name], [name], MD  carbidopa-levodopa (SINEMET IR) 25-100 MG tablet Take 2 tablets at 7am/1 at 11am/1 at 4pm 06/28/21   Tat, Rebecca S, DO  Cranberry 400 MG CAPS Take 1 capsule by mouth daily.    [provider]  donepezil (ARICEPT) 10 MG tablet Take 1 tablet (10 mg total) by mouth at bedtime. 06/14/20   Tat, Eustace Quail, DO  HYDROcodone-acetaminophen (NORCO/VICODIN) 5-325 MG tablet Take 1 tablet by mouth every 6 (six) hours as needed for moderate pain.    [provider]  ketoconazole (NIZORAL) 2 % cream Apply 1 application topically daily.    [provider]  lactobacillus acidophilus (BACID) TABS tablet Take 1 tablet by mouth 2 (two) times daily.    [provider]  levothyroxine (SYNTHROID) 150 MCG tablet Take 150 mcg by mouth daily before breakfast.    [provider]  Magnesium 400 MG TABS Take 1 tablet by mouth daily.    [provider]  metFORMIN (GLUCOPHAGE) 1000 MG tablet Take 1,000 mg by mouth 2 (two) times daily. 11/14/20   [provider]  metFORMIN (GLUCOPHAGE) 500 MG tablet Take 500 mg by mouth 2 (two) times daily with a meal.    [provider]  ondansetron (ZOFRAN) 4 MG tablet Take 4 mg by mouth every 6 (six) hours as needed for nausea or vomiting.    [provider]  QUEtiapine (SEROQUEL) 25 MG tablet Take 25 mg by mouth at bedtime. 12/16/20   [provider]  sertraline (ZOLOFT) 100 MG tablet Take 100 mg by mouth daily. 12/07/20   [provider]  warfarin (COUMADIN) 1 MG tablet  Take by mouth. 12/14/20   [provider]  warfarin (COUMADIN) 3 MG tablet Take 3 mg by mouth daily. 12/14/20   [provider]      Allergies    Crestor [rosuvastatin calcium], Erythromycin, Gabapentin, Pregabalin, Pregabalin, Macrobid [nitrofurantoin], and Losartan    Review of Systems   Review of Systems  Physical Exam Updated Vital Signs BP (!) 111/50   Pulse (!) 113   Temp (!) 103.6 F (39.8 C) (Rectal)   Resp (!) 31   Ht '5\' 6"'$  (1.676 m)   Wt 81.6 kg   SpO2 97%   BMI 29.05 kg/m  Physical Exam Vitals and nursing note reviewed.  Constitutional:      General: She is in acute distress.      Appearance: She is well-developed. She is ill-appearing.  HENT:     Head: Normocephalic and atraumatic.     Nose: Nose normal.     Mouth/Throat:     Mouth: Mucous membranes are moist.  Eyes:     Extraocular Movements: Extraocular movements intact.     Conjunctiva/sclera: Conjunctivae normal.     Pupils: Pupils are equal, round, and reactive to light.  Cardiovascular:     Rate and Rhythm: Regular rhythm. Tachycardia present.     Pulses: Normal pulses.     Heart sounds: Normal heart sounds. No murmur heard. Pulmonary:     Effort: Pulmonary effort is normal. No respiratory distress.     Breath sounds: Normal breath sounds.  Abdominal:     General: Abdomen is flat.     Palpations: Abdomen is soft.     Tenderness: There is no abdominal tenderness.  Musculoskeletal:        General: No swelling.     Cervical back: Normal range of motion and neck supple.  Skin:    General: Skin is warm and dry.     Capillary Refill: Capillary refill takes less than 2 seconds.  Neurological:     Comments: Patient is somnolent, arouses to painful stimuli, appears encephalopathic     ED Results / Procedures / Treatments   Labs (all labs ordered are listed, but only abnormal results are displayed) Labs Reviewed  LACTIC ACID, PLASMA - Abnormal; Notable for the following components:      Result Value   Lactic Acid, Venous 5.5 (*)    All other components within normal limits  COMPREHENSIVE METABOLIC PANEL - Abnormal; Notable for the following components:   Sodium 152 (*)    Chloride 114 (*)    Glucose, Bld 214 (*)    BUN 27 (*)    Creatinine, Ser 1.24 (*)    Calcium 8.7 (*)    Albumin 2.0 (*)    GFR, Estimated 45 (*)    All other components within normal limits  CBC WITH DIFFERENTIAL/PLATELET - Abnormal; Notable for the following components:   WBC 28.8 (*)    RBC 3.62 (*)    Hemoglobin 9.8 (*)    HCT 31.8 (*)    Platelets 522 (*)    Neutro Abs 25.2 (*)    Monocytes Absolute 1.5 (*)    Abs  Immature Granulocytes 0.61 (*)    All other components within normal limits  PROTIME-INR - Abnormal; Notable for the following components:   Prothrombin Time 56.1 (*)    INR 6.4 (*)    All other components within normal limits  APTT - Abnormal; Notable for the following components:   aPTT 62 (*)    All other  components within normal limits  URINALYSIS, ROUTINE W REFLEX MICROSCOPIC - Abnormal; Notable for the following components:   Color, Urine AMBER (*)    APPearance TURBID (*)    Glucose, UA   (*)    Value: TEST NOT REPORTED DUE TO COLOR INTERFERENCE OF URINE PIGMENT   Hgb urine dipstick   (*)    Value: TEST NOT REPORTED DUE TO COLOR INTERFERENCE OF URINE PIGMENT   Bilirubin Urine   (*)    Value: TEST NOT REPORTED DUE TO COLOR INTERFERENCE OF URINE PIGMENT   Ketones, ur   (*)    Value: TEST NOT REPORTED DUE TO COLOR INTERFERENCE OF URINE PIGMENT   Protein, ur   (*)    Value: TEST NOT REPORTED DUE TO COLOR INTERFERENCE OF URINE PIGMENT   Nitrite   (*)    Value: TEST NOT REPORTED DUE TO COLOR INTERFERENCE OF URINE PIGMENT   Leukocytes,Ua   (*)    Value: TEST NOT REPORTED DUE TO COLOR INTERFERENCE OF URINE PIGMENT   All other components within normal limits  URINALYSIS, MICROSCOPIC (REFLEX) - Abnormal; Notable for the following components:   Bacteria, UA MANY (*)    All other components within normal limits  RESP PANEL BY RT-PCR (FLU A&B, COVID) ARPGX2  CULTURE, BLOOD (ROUTINE X 2)  CULTURE, BLOOD (ROUTINE X 2)  URINE CULTURE  LACTIC ACID, PLASMA    EKG EKG Interpretation  Date/Time:  Friday January 13 2022 13:03:28 EST Ventricular Rate:  143 PR Interval:  112 QRS Duration: 80 QT Interval:  290 QTC Calculation: 448 R Axis:   63 Text Interpretation: Supraventricular tachycardia Confirmed by Lennice Sites ([id number]) on 01/13/2022 2:09:20 PM  Radiology DG Chest Port 1 View  Result Date: 01/13/2022 CLINICAL DATA:  Increased confusion, was supposed to start antibiotics for  UTI but did not EXAM: PORTABLE CHEST 1 VIEW COMPARISON:  Portable exam 1333 hours compared to 03/13/2019 FINDINGS: Borderline enlargement of cardiac silhouette. Atherosclerotic calcification aorta. Mediastinal contours and pulmonary vascularity normal. Bronchitic changes with bibasilar atelectasis. No infiltrate, pleural effusion, or pneumothorax. Bones demineralized. IMPRESSION: Bibasilar atelectasis. Aortic Atherosclerosis (ICD10-I70.0). Electronically Signed   By: Lavonia Dana M.D.   On: 01/13/2022 13:50    Procedures .Critical Care  Performed by: Lennice Sites, DO Authorized by: Lennice Sites, DO   Critical care provider statement:    Critical care time (minutes):  40   Critical care was necessary to treat or prevent imminent or life-threatening deterioration of the following conditions:  Sepsis   Critical care was time spent personally by me on the following activities:  Blood draw for specimens, development of treatment plan with patient or surrogate, evaluation of patient's response to treatment, examination of patient, obtaining history from patient or surrogate, ordering and performing treatments and interventions, ordering and review of laboratory studies, ordering and review of radiographic studies, pulse oximetry, re-evaluation of patient's condition and review of old charts   I assumed direction of critical care for this patient from another provider in my specialty: no     {Document cardiac monitor, telemetry assessment procedure when appropriate:1}  Medications Ordered in ED Medications  lactated ringers infusion ( Intravenous New Bag/Given 01/13/22 1459)  meropenem (MERREM) 1 g in sodium chloride 0.9 % 100 mL IVPB (has no administration in time range)  meropenem (MERREM) 1 g in sodium chloride 0.9 % 100 mL IVPB (has no administration in time range)  sodium chloride 0.9 % bolus 1,000 mL (has no administration in time range)  sodium chloride 0.9 % bolus 2,000 mL (0 mLs Intravenous  Stopped 01/13/22 1458)  acetaminophen (TYLENOL) 650 MG suppository (650 mg  Given 01/13/22 1351)    ED Course/ Medical Decision Making/ A&P                           Medical Decision Making Amount and/or Complexity of Data Reviewed Labs: ordered. Radiology: ordered. ECG/medicine tests: ordered.  Risk Prescription drug management.   Olinda Nola is here with altered mental status.  Patient arrives febrile to 103.6.  Tachycardic to 140.  Patient is encephalopathic.  She is DNR DNI.  She is not recently dealing with urinary tract infections and kidney stones.  She had a stent placed for kidney stone recently.  She is admitted for UTI recently as well.  Supposedly she had a antibiotic called in about a week ago with her urologist but this was not filled due to some communication issues.  She lives at an assisted living facility with 24/7 home health.  She has been more lethargic over the last 2 days.  She is completely dependent on her ADLs but she is usually able to be alert talk a little bit eat food but has not been able to do that.  Overall sepsis work-up initiated.  Suspect UTI as the cause of her fever or encephalopathy.  Possibly an infected kidney stone as well.  Per chart review she has a history of Klebsiella that was seen on most recent urine culture that was pansensitive however she has had ESBL twice.  After talking with pharmacy they recommend starting with meropenem.  IV fluids have been initiated with 2 L of IV fluids.  I have given her Tylenol as well.  We will get blood cultures, urine studies, CBC, lactic acid, CT scan of the head and CT scan of the abdomen and pelvis to further evaluate.  Per further chart review she had a stent placed for right-sided kidney stone.  But she had nonobstructive stone on the left side as well.  Per my review and interpretation of labs thus far patient with a leukocytosis of 28.  INR 6.1.  Patient on Coumadin for PE.  Urinalysis appears mostly  difficult to evaluate given that it is bloody but there is many bacteria.  Suspect that this is a UTI.  But could be complicated by kidney stone.  We will need a CT scan to further evaluate.  Lactic acid is 5.5.  Will give 30 cc/kg of IV fluids to complete sepsis work-up.  Sodium is also 152.  Suspect she has pretty significant dehydration despite having fairly normal kidney function.  Patient handed off to oncoming ED staff with patient pending CT report and then likely consultation with urology and medicine admission.  We will hold off on reversing INR at this time.  Can likely just hold Coumadin if does not need any surgical process.  This chart was dictated using voice recognition software.  Despite best efforts to proofread,  errors can occur which can change the documentation meaning.   {Document critical care time when appropriate:1} {Document review of labs and clinical decision tools ie heart score, Chads2Vasc2 etc:1}  {Document your independent review of radiology images, and any outside records:1} {Document your discussion with family members, caretakers, and with consultants:1} {Document social determinants of health affecting pt's care:1} {Document your decision making why or why not admission, treatments were needed:1} Final Clinical Impression(s) / ED Diagnoses Final diagnoses:  None    Rx / DC Orders ED Discharge Orders     None

## 2022-01-13 NOTE — Progress Notes (Signed)
eLink Physician-Brief Progress Note Patient Name: [name] [name] DOB: 21-Aug-1945 MRN: [id number]   Date of Service  01/13/2022  HPI/Events of Note  Patient with advanced parkinson's disease and history of DVT on coumadin admitted with septic shock of unclear etiology, iatrogenic coagulopathy, and breast hematoma with acute blood loss anemia, she is DNR / DNI.  eICU Interventions  New Patient Evaluation.        Kerry Kass Tiffany Calmes 01/13/2022, 7:36 PM

## 2022-01-13 NOTE — H&P (Signed)
NAME:  [name] [name], MRN:  [id number], DOB:  02-Sep-1945, LOS: 0 ADMISSION DATE:  01/13/2022, CONSULTATION DATE:  01/13/22 REFERRING MD:  Ronnald Nian, CHIEF COMPLAINT:  AMS   History of Present Illness:  76 year old woman with hx of Parkinson's, recurrent UTI, VTE on coumadin, progressive dementia and FTT who presents from nursing home with worsened AMS. Found to be in septic shock.  Has history of kidney stones so renal stone study done showing no obstruction but did reveal a large R breast/chest wall hematoma.  INR 6 so given vitamin K.  Pressures remain low despite fluids.  Family wants trial of pressors so admitting to ICU. Patient is DNR.  Pertinent  Medical History   Past Medical History:  Diagnosis Date   Acute metabolic encephalopathy 14/48/1856   Adenoma of right adrenal gland 02/20/2018   AKI (acute kidney injury) 02/12/2017   Arthritis    Atypical parkinsonism state 05/17/2015   Benign essential hypertension    Complication of anesthesia    severe itching night of surgery requiring meds- also couldnt swallow- throat "paralyzed""   Displaced fracture of fifth cervical vertebra 12/17/2015   Fibromyalgia    Gait disturbance 12/17/2015   Glaucoma 10/13/2017   Hematuria 04/05/2016   History of prediabetes 10/13/2017   History of pulmonary embolus (PE) 2/2 hypercoagulable state 11/23/2016   History of sepsis 09/17/2016   History of TIA (transient ischemic attack) 10/13/2017   Hypercholesterolemia    Hyperlipidemia    Hyponatremia 01/03/2019   Hypothyroidism    s/p graves disease   Kidney stone    Left ventricular diastolic dysfunction 31/49/7026   Major neurocognitive disorder 02/23/2020   Osteopenia 12/18/2017   Peripheral vascular disease    PULMONARY EMBOLUS x 2- 2011, 2012/ FOLLOWED BY DR [id number] 12/12 EPIC   PT STAES WILL STOP COUMADIN 3/12 and begin LOVONOX 05/17/11 as previously instructed   Pulmonary hypertension 11/23/2016   Recurrent UTI 11/23/2016   S/P right  TKA 05/22/2011   Sinus infection    SIRS (systemic inflammatory response syndrome) 04/29/2016   Subtherapeutic international normalized ratio (INR) 01/03/2019   Trochanteric bursitis of both hips 09/13/2015     Significant Hospital Events: Including procedures, antibiotic start and stop dates in addition to other pertinent events   01/13/22 admit  Interim History / Subjective:  Consulted.  Objective   Blood pressure (!) 101/51, pulse 97, temperature (!) 103.6 F (39.8 C), temperature source Rectal, resp. rate (!) 28, height '5\' 6"'$  (1.676 m), weight 81.6 kg, SpO2 96 %.        Intake/Output Summary (Last 24 hours) at 01/13/2022 1655 Last data filed at 01/13/2022 1628 Gross per 24 hour  Intake 100 ml  Output --  Net 100 ml   Filed Weights   01/13/22 1342  Weight: 81.6 kg    Examination: General: ill appearing woman in NAD HENT: MM dry, trachea midline, tracking Lungs: Diminished bases due to poor inspiratory effort Cardiovascular: Tachy, ext warm Abdomen: soft, +BS no pain to palpation Extremities: No edema Neuro: Moves ext to command but weak Skin: large R breast bruise noted  WBC 30k Febrile to 39C AKI CXR atelectasis CT head ok Resolved Hospital Problem list   N/A  Assessment & Plan:  Septic shock presumed urinary source, less likely hemorrhagic given size of hematoma  Septic encephalopathy  ABLA from breast hematoma- has hx of falls but unclear when this occurred  Acute kidney injury from dehydration and ? Septic ATN  Progressive FTT from Parkinson's,  dementia, general debility and inability to care for self  Hx ESBL infections  - Meropenem - Vit K '5mg'$ , watch INR/ CBC and clinical exam - Levophed peripheral protocol - LR as ordered, avoid nephrotoxins  Daughter and son at bedside.  Given her progressive deterioration these past couple months, they do not think she would want overly aggressive measures.  We agreed to do peripheral vasopressors and  abx.  Should Bps drop despite the peripheral vasopressors (ie beyond what can safely be given peripherally), we will transition to a more comfort based approach.   Best Practice (right click and "Reselect all SmartList Selections" daily)   Diet/type: NPO DVT prophylaxis: SCD GI prophylaxis: N/A Lines: N/A Foley:  Yes, and it is still needed Code Status:  DNR Last date of multidisciplinary goals of care discussion [01/13/22]  Labs   CBC: Recent Labs  Lab 01/13/22 1305  WBC 28.8*  NEUTROABS 25.2*  HGB 9.8*  HCT 31.8*  MCV 87.8  PLT 522*    Basic Metabolic Panel: Recent Labs  Lab 01/13/22 1305  NA 152*  K 4.2  CL 114*  CO2 23  GLUCOSE 214*  BUN 27*  CREATININE 1.24*  CALCIUM 8.7*   GFR: Estimated Creatinine Clearance: 40.3 mL/min (A) (by C-G formula based on SCr of 1.24 mg/dL (H)). Recent Labs  Lab 01/13/22 1305 01/13/22 1500  WBC 28.8*  --   LATICACIDVEN 5.5* 4.7*    Liver Function Tests: Recent Labs  Lab 01/13/22 1305  AST 21  ALT 6  ALKPHOS 119  BILITOT 0.7  PROT 6.5  ALBUMIN 2.0*   No results for input(s): "LIPASE", "AMYLASE" in the last 168 hours. No results for input(s): "AMMONIA" in the last 168 hours.  ABG No results found for: "PHART", "PCO2ART", "PO2ART", "HCO3", "TCO2", "ACIDBASEDEF", "O2SAT"   Coagulation Profile: Recent Labs  Lab 01/13/22 1305  INR 6.4*    Cardiac Enzymes: No results for input(s): "CKTOTAL", "CKMB", "CKMBINDEX", "TROPONINI" in the last 168 hours.  HbA1C: Hgb A1c MFr Bld  Date/Time Value Ref Range Status  06/09/2020 11:32 AM 11.9 (H) 4.6 - 6.5 % Final    Comment:    Glycemic Control Guidelines for People with Diabetes:Non Diabetic:  <6%Goal of Therapy: <7%Additional Action Suggested:  >8%   11/03/2019 01:52 PM 6.1 (H) <5.7 % of total Hgb Final    Comment:    For someone without known diabetes, a hemoglobin  A1c value between 5.7% and 6.4% is consistent with prediabetes and should be confirmed with a   follow-up test. . For someone with known diabetes, a value <7% indicates that their diabetes is well controlled. A1c targets should be individualized based on duration of diabetes, age, comorbid conditions, and other considerations. . This assay result is consistent with an increased risk of diabetes. . Currently, no consensus exists regarding use of hemoglobin A1c for diagnosis of diabetes for children. .     CBG: No results for input(s): "GLUCAP" in the last 168 hours.  Review of Systems:   Limited by encephalopathy but is able to tell me she has no pain/N/V/D.  Past Medical History:  She,  has a past medical history of Acute metabolic encephalopathy (32/35/5732), Adenoma of right adrenal gland (02/20/2018), AKI (acute kidney injury) (02/12/2017), Arthritis, Atypical parkinsonism state (05/17/2015), Benign essential hypertension, Complication of anesthesia, Displaced fracture of fifth cervical vertebra (12/17/2015), Fibromyalgia, Gait disturbance (12/17/2015), Glaucoma (10/13/2017), Hematuria (04/05/2016), History of prediabetes (10/13/2017), History of pulmonary embolus (PE) 2/2 hypercoagulable state (11/23/2016), History of sepsis (  09/17/2016), History of TIA (transient ischemic attack) (10/13/2017), Hypercholesterolemia, Hyperlipidemia, Hyponatremia (01/03/2019), Hypothyroidism, Kidney stone, Left ventricular diastolic dysfunction (29/92/4268), Major neurocognitive disorder (02/23/2020), Osteopenia (12/18/2017), Peripheral vascular disease, Pulmonary hypertension (11/23/2016), Recurrent UTI (11/23/2016), S/P right TKA (05/22/2011), Sinus infection, SIRS (systemic inflammatory response syndrome) (04/29/2016), Subtherapeutic international normalized ratio (INR) (01/03/2019), and Trochanteric bursitis of both hips (09/13/2015).   Surgical History:   Past Surgical History:  Procedure Laterality Date   ABDOMINAL HYSTERECTOMY     APPENDECTOMY     CATARACT EXTRACTION Bilateral     CHOLECYSTECTOMY     COLONOSCOPY     EXPLORATORY LAPAROTOMY     JOINT REPLACEMENT     left knee  7/12   TOTAL KNEE ARTHROPLASTY  05/22/2011   Procedure: TOTAL KNEE ARTHROPLASTY;  Surgeon: Mauri Pole, MD;  Location: [facility];  Service: Orthopedics;  Laterality: Right;     Social History:   reports that she quit smoking about 32 years ago. She smoked an average of 1 pack per day. She has never used smokeless tobacco. She reports that she does not currently use alcohol. She reports that she does not use drugs.   Family History:  Her family history includes Memory loss in her sister; Prostate cancer in her father; Stroke in her father.   Allergies Allergies  Allergen Reactions   Crestor [Rosuvastatin Calcium] Other (See Comments)    Feeling very bad, aching all over.   Erythromycin Hives   Gabapentin Other (See Comments)    Blurred vision   Pregabalin Other (See Comments)    Crossed vision.   Pregabalin Other (See Comments)    Crossed vision. Unknown    Macrobid [Nitrofurantoin] Hives   Losartan Itching     Home Medications  Prior to Admission medications   Medication Sig Start Date End Date Taking? Authorizing Provider  amLODipine (NORVASC) 2.5 MG tablet TAKE 1 TABLET BY MOUTH ONCE DAILY Patient taking differently: Take 2.5 mg by mouth at bedtime. 04/23/19  Yes [name], [name], MD  aspirin EC 81 MG tablet Take 81 mg by mouth daily. Swallow whole.   Yes [provider]  atorvastatin (LIPITOR) 10 MG tablet TAKE 1 TABLET BY MOUTH ONCE DAILY Patient taking differently: Take 10 mg by mouth daily. 04/23/19  Yes [name], [name], MD  carbidopa-levodopa (SINEMET IR) 25-100 MG tablet Take 2 tablets at 7am/1 at 11am/1 at 4pm Patient taking differently: Take 2 tablets by mouth 3 (three) times daily. 06/28/21  Yes Tat, Eustace Quail, DO  Cranberry 425 MG CAPS Take 1 capsule by mouth daily.   Yes [provider]  donepezil (ARICEPT) 10 MG tablet Take 1 tablet (10 mg total)  by mouth at bedtime. 06/14/20  Yes Tat, Eustace Quail, DO  HYDROcodone-acetaminophen (NORCO/VICODIN) 5-325 MG tablet Take 1 tablet by mouth every 8 (eight) hours as needed for moderate pain.   Yes [provider]  lactobacillus acidophilus (BACID) TABS tablet Take 1 tablet by mouth 2 (two) times daily.   Yes [provider]  levothyroxine (SYNTHROID) 125 MCG tablet Take 125 mcg by mouth daily before breakfast.   Yes [provider]  Magnesium 400 MG TABS Take 1 tablet by mouth daily.   Yes [provider]  melatonin 3 MG TABS tablet Take 3 mg by mouth at bedtime.   Yes [provider]  metFORMIN (GLUCOPHAGE) 500 MG tablet Take 500 mg by mouth 2 (two) times daily. 11/14/20  Yes [provider]  QUEtiapine (SEROQUEL) 25 MG tablet Take  12.5-25 mg by mouth See admin instructions. Take 12.5 mg (1/2 tablet) by mouth in the morning, and 25 mg (1 tablet) at bedtime. 12/16/20  Yes [provider]  sertraline (ZOLOFT) 100 MG tablet Take 100 mg by mouth daily. 12/07/20  Yes [provider]  acetaminophen (TYLENOL) 500 MG tablet Take 500 mg by mouth every 6 (six) hours as needed for mild pain.    [provider]  ketoconazole (NIZORAL) 2 % cream Apply 1 application topically daily.    [provider]  metFORMIN (GLUCOPHAGE) 500 MG tablet Take 500 mg by mouth 2 (two) times daily with a meal.    [provider]  ondansetron (ZOFRAN) 4 MG tablet Take 4 mg by mouth every 6 (six) hours as needed for nausea or vomiting.    [provider]  warfarin (COUMADIN) 1 MG tablet Take by mouth. 12/14/20   [provider]  warfarin (COUMADIN) 3 MG tablet Take 3 mg by mouth daily. 12/14/20   [provider]     Critical care time: 33 minutes

## 2022-01-13 NOTE — Sepsis Progress Note (Signed)
Code Sepsis protocol being monitored by eLink. 

## 2022-01-13 NOTE — ED Notes (Signed)
Spoke with daughter [name] [phone]  she was updated , she also spoke with Dr. Ronnald Nian, patient was incont of large soft stool patient was cleaned and pad applied to buttocks due to breakdown.daughter states she noticed thurs when she visited her MOM that she had breakdown.

## 2022-01-13 NOTE — ED Provider Notes (Signed)
Care transferred to me.  Patient's blood pressures are soft but map is 65.  She is awake but altered from baseline.  I discussed with son at the bedside who is the only family currently present.  At this point, patient is overall ill-appearing, tachycardic and with borderline blood pressures.  She is currently starting a third L of fluid.  I had goals of care discussion with the son but he would like to wait for the daughter who is the POA.  He is not sure if she would want pressors or other critical interventions.  She is definitely a DNR/DNI.  CT head images viewed/interpreted by myself, no head bleed.  CT stone images viewed/interpreted by myself and she has an intact stent but no obvious new obstructing stone.  There is a sizable hematoma to the right chest wall and when I examined this in person she does have some significant breast ecchymosis.  Otherwise, will give IV vitamin K for the elevated INR without obvious bleeding (unclear when this ecchymosis started).  I have consulted the ICU for her. Discussed case with Marni Griffon. ICU will admit.   Sherwood Gambler, MD 01/13/22 [phone]

## 2022-01-13 NOTE — Progress Notes (Signed)
Pharmacy Antibiotic Note  [name] [name] is a 76 y.o. female admitted on 01/13/2022 presenting with increasing confusion, hx of recurrent UTI with colonization ESBL E coli (3 ESBL UCx over last 3 years with most recent 12/09/21 carbapenem sensitive),  however most recent UCx with klebsiella 01/04/22, considering sepsis and historical colonization will initiate Merrem.    Plan: Merrem 1g IV every 12 hours Monitor renal function, UCx to narrow  Height: '5\' 6"'$  (167.6 cm) Weight: 81.6 kg (180 lb) IBW/kg (Calculated) : 59.3  Temp (24hrs), Avg:103.6 F (39.8 C), Min:103.6 F (39.8 C), Max:103.6 F (39.8 C)  Recent Labs  Lab 01/13/22 1305  WBC 28.8*    CrCl cannot be calculated (Patient's most recent lab result is older than the maximum 21 days allowed.).    Allergies  Allergen Reactions   Crestor [Rosuvastatin Calcium] Other (See Comments)    Feeling very bad, aching all over.   Erythromycin Hives   Gabapentin Other (See Comments)    Blurred vision   Pregabalin Other (See Comments)    Crossed vision.   Pregabalin Other (See Comments)    Crossed vision. Unknown    Macrobid [Nitrofurantoin] Hives   Losartan Itching    Bertis Ruddy, PharmD Clinical Pharmacist ED Pharmacist Phone # [phone] 01/13/2022 2:47 PM

## 2022-01-13 NOTE — Sepsis Progress Note (Addendum)
Notified bedside nurse of need to administer antibiotics.  Order to not delay antibiotics if unable to obtain blood culture. RN got back, that IV team assisting with IV start and blood cx. Will start antibiotics asap

## 2022-01-13 NOTE — ED Triage Notes (Signed)
Patient presents to ed via [facility]  from [facility] states she was dx. With UTI 1 week ago family was suppose to bring her antibiotics however she never got them. States patient has been increased confusion  the past week.  And today would not respond to them. States she normally walks with a walker and will carry on a converstation. Upon arrival patient will not open her eyes to voice her eyelids are fluttering, when I try to open her eye she is holding them tight.

## 2022-01-13 NOTE — Sepsis Progress Note (Signed)
LA trending down, 5.5 to 4.7.  Has had 3.3L IVF. And merrem. CCM was consulted

## 2022-01-14 DIAGNOSIS — R638 Other symptoms and signs concerning food and fluid intake: Secondary | ICD-10-CM

## 2022-01-14 DIAGNOSIS — Z711 Person with feared health complaint in whom no diagnosis is made: Secondary | ICD-10-CM

## 2022-01-14 DIAGNOSIS — Z515 Encounter for palliative care: Secondary | ICD-10-CM

## 2022-01-14 DIAGNOSIS — Z789 Other specified health status: Secondary | ICD-10-CM

## 2022-01-14 DIAGNOSIS — Z7189 Other specified counseling: Secondary | ICD-10-CM

## 2022-01-14 DIAGNOSIS — R791 Abnormal coagulation profile: Secondary | ICD-10-CM

## 2022-01-14 DIAGNOSIS — R6521 Severe sepsis with septic shock: Secondary | ICD-10-CM | POA: Diagnosis not present

## 2022-01-14 DIAGNOSIS — Z66 Do not resuscitate: Secondary | ICD-10-CM

## 2022-01-14 DIAGNOSIS — N3000 Acute cystitis without hematuria: Secondary | ICD-10-CM

## 2022-01-14 DIAGNOSIS — A419 Sepsis, unspecified organism: Secondary | ICD-10-CM | POA: Diagnosis not present

## 2022-01-14 LAB — BLOOD CULTURE ID PANEL (REFLEXED) - BCID2

## 2022-01-14 LAB — BASIC METABOLIC PANEL
Anion gap: 9 (ref 5–15)
BUN: 24 mg/dL — ABNORMAL HIGH (ref 8–23)
CO2: 24 mmol/L (ref 22–32)
Calcium: 7.8 mg/dL — ABNORMAL LOW (ref 8.9–10.3)
Chloride: 116 mmol/L — ABNORMAL HIGH (ref 98–111)
Creatinine, Ser: 0.92 mg/dL (ref 0.44–1.00)
GFR, Estimated: >60 mL/min (ref >60)
Glucose, Bld: 188 mg/dL — ABNORMAL HIGH (ref 70–99)
Potassium: 3.4 mmol/L — ABNORMAL LOW (ref 3.5–5.1)
Sodium: 149 mmol/L — ABNORMAL HIGH (ref 135–145)

## 2022-01-14 LAB — CBC
HCT: 25 % — ABNORMAL LOW (ref 36.0–46.0)
HCT: 25.2 % — ABNORMAL LOW (ref 36.0–46.0)
Hemoglobin: 7.6 g/dL — ABNORMAL LOW (ref 12.0–15.0)
Hemoglobin: 7.5 g/dL — ABNORMAL LOW (ref 12.0–15.0)
MCH: 27.3 pg (ref 26.0–34.0)
MCH: 26.7 pg (ref 26.0–34.0)
MCHC: 30.4 g/dL (ref 30.0–36.0)
MCHC: 29.8 g/dL — ABNORMAL LOW (ref 30.0–36.0)
MCV: 89.9 fL (ref 80.0–100.0)
MCV: 89.7 fL (ref 80.0–100.0)
Platelets: 441 10*3/uL — ABNORMAL HIGH (ref 150–400)
Platelets: 383 10*3/uL (ref 150–400)
RBC: 2.78 MIL/uL — ABNORMAL LOW (ref 3.87–5.11)
RBC: 2.81 MIL/uL — ABNORMAL LOW (ref 3.87–5.11)
RDW: 14.1 % (ref 11.5–15.5)
RDW: 14 % (ref 11.5–15.5)
WBC: 26 10*3/uL — ABNORMAL HIGH (ref 4.0–10.5)
WBC: 18.2 10*3/uL — ABNORMAL HIGH (ref 4.0–10.5)
nRBC: 0.1 % (ref 0.0–0.2)
nRBC: 0.1 % (ref 0.0–0.2)

## 2022-01-14 LAB — PROTIME-INR
INR: 1.9 — ABNORMAL HIGH (ref 0.8–1.2)
Prothrombin Time: 22 seconds — ABNORMAL HIGH (ref 11.4–15.2)

## 2022-01-14 LAB — URINE CULTURE

## 2022-01-14 LAB — GLUCOSE, CAPILLARY
Glucose-Capillary: 173 mg/dL — ABNORMAL HIGH (ref 70–99)
Glucose-Capillary: 135 mg/dL — ABNORMAL HIGH (ref 70–99)
Glucose-Capillary: 140 mg/dL — ABNORMAL HIGH (ref 70–99)
Glucose-Capillary: 183 mg/dL — ABNORMAL HIGH (ref 70–99)

## 2022-01-14 MED ORDER — HALOPERIDOL LACTATE 5 MG/ML IJ SOLN: 2.0000 mg | Freq: Four times a day (QID) | INTRAMUSCULAR | Status: DC | PRN

## 2022-01-14 MED ORDER — ORAL CARE MOUTH RINSE
15.0000 mL | OROMUCOSAL | Status: DC
  Administered 2022-01-14 (×2): 15 mL via OROMUCOSAL

## 2022-01-14 MED ORDER — ACETAMINOPHEN 325 MG PO TABS: 650.0000 mg | ORAL_TABLET | Freq: Four times a day (QID) | ORAL | Status: DC | PRN

## 2022-01-14 MED ORDER — POTASSIUM CHLORIDE 10 MEQ/100ML IV SOLN
10.0000 meq | INTRAVENOUS | Status: AC
  Administered 2022-01-14 (×4): 10 meq via INTRAVENOUS
  Filled 2022-01-14 (×4): qty 100

## 2022-01-14 MED ORDER — LORAZEPAM 2 MG/ML IJ SOLN: 1.0000 mg | INTRAMUSCULAR | Status: DC | PRN

## 2022-01-14 MED ORDER — LORAZEPAM 1 MG PO TABS
1.0000 mg | ORAL_TABLET | ORAL | Status: DC | PRN
  Administered 2022-01-17: 1 mg via ORAL
  Filled 2022-01-14: qty 1

## 2022-01-14 MED ORDER — DIPHENHYDRAMINE HCL 50 MG/ML IJ SOLN: 12.5000 mg | INTRAMUSCULAR | Status: DC | PRN

## 2022-01-14 MED ORDER — LORAZEPAM 2 MG/ML PO CONC: 1.0000 mg | ORAL | Status: DC | PRN

## 2022-01-14 MED ORDER — MORPHINE SULFATE (PF) 2 MG/ML IV SOLN
2.0000 mg | INTRAVENOUS | Status: DC | PRN
  Administered 2022-01-15 – 2022-01-16 (×5): 2 mg via INTRAVENOUS
  Filled 2022-01-14 (×6): qty 1

## 2022-01-14 MED ORDER — SODIUM CHLORIDE 0.9 % IV SOLN
1.0000 g | Freq: Three times a day (TID) | INTRAVENOUS | Status: DC
  Administered 2022-01-14 (×2): 1 g via INTRAVENOUS
  Filled 2022-01-14 (×3): qty 20

## 2022-01-14 MED ORDER — GLYCOPYRROLATE 1 MG PO TABS: 1.0000 mg | ORAL_TABLET | ORAL | Status: DC | PRN

## 2022-01-14 MED ORDER — HALOPERIDOL 1 MG PO TABS: 2.0000 mg | ORAL_TABLET | Freq: Four times a day (QID) | ORAL | Status: DC | PRN

## 2022-01-14 MED ORDER — ACETAMINOPHEN 650 MG RE SUPP: 650.0000 mg | Freq: Four times a day (QID) | RECTAL | Status: DC | PRN

## 2022-01-14 MED ORDER — ORAL CARE MOUTH RINSE: 15.0000 mL | OROMUCOSAL | Status: DC | PRN

## 2022-01-14 MED ORDER — POLYVINYL ALCOHOL 1.4 % OP SOLN: 1.0000 [drp] | Freq: Four times a day (QID) | OPHTHALMIC | Status: DC | PRN

## 2022-01-14 MED ORDER — WHITE PETROLATUM EX OINT
TOPICAL_OINTMENT | CUTANEOUS | Status: DC | PRN
  Filled 2022-01-14: qty 28.35

## 2022-01-14 MED ORDER — GLYCOPYRROLATE 0.2 MG/ML IJ SOLN: 0.2000 mg | INTRAMUSCULAR | Status: DC | PRN

## 2022-01-14 MED ORDER — ONDANSETRON 4 MG PO TBDP: 4.0000 mg | ORAL_TABLET | Freq: Four times a day (QID) | ORAL | Status: DC | PRN

## 2022-01-14 MED ORDER — ONDANSETRON HCL 4 MG/2ML IJ SOLN: 4.0000 mg | Freq: Four times a day (QID) | INTRAMUSCULAR | Status: DC | PRN

## 2022-01-14 MED ORDER — HALOPERIDOL LACTATE 2 MG/ML PO CONC: 2.0000 mg | Freq: Four times a day (QID) | ORAL | Status: DC | PRN

## 2022-01-14 MED ORDER — BIOTENE DRY MOUTH MT LIQD
15.0000 mL | Freq: Two times a day (BID) | OROMUCOSAL | Status: DC
  Administered 2022-01-14 – 2022-01-17 (×4): 15 mL via TOPICAL

## 2022-01-14 MED ORDER — SODIUM CHLORIDE 0.9 % IV SOLN
2.0000 g | INTRAVENOUS | Status: DC
  Administered 2022-01-15 – 2022-01-16 (×2): 2 g via INTRAVENOUS
  Filled 2022-01-14 (×2): qty 20

## 2022-01-14 NOTE — TOC Initial Note (Signed)
Transition of Care [facility]) - Initial/Assessment Note    Patient Details  Name: [name] [name] MRN: [id number] Date of Birth: January 13, 1946  Transition of Care [facility]) CM/SW Contact:    Bartholomew Crews, RN Phone Number: [phone] 01/14/2022, 2:29 PM  Clinical Narrative:                  Notified by palliative team of family desire for residential hospice vs. Hospice care at [facility] where patient has been a resident for years. Confirmed [facility] as preferred hospice choice - [name] stated that geographically it is the closest hospice facility stating it is only a couple miles from where patient has been currently residing. Reviewed withdrawal of care and additions of comfort medications to assist with shortness of breath, pain, nausea/vomiting, and anxiety. Confirmed desire to continue IV antibiotics until discharged. Advised that patient has been receiving medication to support her blood pressure which will be discontinued and may cause her condition to deteriorate - Monica verbalized understanding. Confirmed need for ambulance transport. Spoke with liaison, Benjamine Mola, at [facility] to update referral. TOC following for transition needs.   Expected Discharge Plan: [place] Barriers to Discharge: Continued Medical Work up   Patient Goals and CMS Choice Patient states their goals for this hospitalization and ongoing recovery are:: residential hospice care CMS Medicare.gov Compare Post Acute Care list provided to:: Patient Represenative (must comment) (daughter, [name]) Choice offered to / list presented to : Adult Children  Expected Discharge Plan and Services Expected Discharge Plan: [place] In-house Referral: Clinical Social Work, Hospice / Palliative Care Discharge Planning Services: CM Consult Post Acute Care Choice: Hospice Living arrangements for the past 2 months: [place] [facility])                 DME Arranged: N/A DME  Agency: NA       HH Arranged: NA HH Agency: NA        Prior Living Arrangements/Services Living arrangements for the past 2 months: [place] [employer], broadcasting/film/video) Lives with:: Facility Resident Patient language and need for interpreter reviewed:: Yes Do you feel safe going back to the place where you live?: Yes            Criminal Activity/Legal Involvement Pertinent to Current Situation/Hospitalization: No - Comment as needed  Activities of Daily Living      Permission Sought/Granted                  Emotional Assessment         Alcohol / Substance Use: Not Applicable Psych Involvement: No (comment)  Admission diagnosis:  Acute cystitis without hematuria [N30.00] Supratherapeutic INR [R79.1] Septic shock ([place]) [A41.9, R65.21] Sepsis, due to unspecified organism, unspecified whether acute organ dysfunction present [facility]) [A41.9] Patient Active Problem List   Diagnosis Date Noted   Septic shock ([place]) 01/13/2022   Major neurocognitive disorder 12/28/8525   Complicated UTI (urinary tract infection) 01/03/2019   Subtherapeutic international normalized ratio (INR) 01/03/2019   Hyponatremia 01/03/2019   Adenoma of right adrenal gland 02/20/2018   Osteopenia 12/18/2017   Glaucoma 10/13/2017   History of TIA (transient ischemic attack) 10/13/2017   History of prediabetes 10/13/2017   Recurrent UTI 11/23/2016   Fibromyalgia 11/23/2016   Pulmonary hypertension 11/23/2016   Left ventricular diastolic dysfunction 78/24/2353   History of pulmonary embolus (PE) 2/2 hypercoagulable state 11/23/2016   History of sepsis 09/17/2016   SIRS (systemic inflammatory response syndrome) 04/29/2016   Hyperlipidemia 04/29/2016  Hypothyroidism 04/29/2016   Hematuria 04/05/2016   Benign essential hypertension    Gait disturbance 12/17/2015   Trochanteric bursitis of both hips 09/13/2015   Chronic low back pain 06/07/2015   Atypical parkinsonism state 05/17/2015   S/P  right TKA 05/22/2011   PCP:  Darreld Mclean, MD Pharmacy:   [place], [place] [address] Phone: [phone] Fax: [place], [place] [address] [place] [address] Phone: [phone] Fax: [phone]  [place], [place] [address] [place] [address] Phone: [phone] Fax: [phone]  [place], [place] [address] [place] [address] Phone: [phone] Fax: [phone]     Social Determinants of Health (SDOH) Interventions    Readmission Risk Interventions     No data to display

## 2022-01-14 NOTE — Plan of Care (Signed)

## 2022-01-14 NOTE — Progress Notes (Signed)
PHARMACY - PHYSICIAN COMMUNICATION CRITICAL VALUE ALERT - BLOOD CULTURE IDENTIFICATION (BCID)  [name] [name] is an 76 y.o. female who presented to [facility] on 01/13/2022 with a chief complaint of AMS and septic shock in the setting to elevated INR and chest wall hematoma.   Assessment:  1 of 4 blood cultures -anaerobic bottle- growing Klebsiella pneumoniae, no resistance detected. Patient great ESBL Ecoli in urine culture in 2020 and is on meropenem. Patient is planning to discharge with hospice but would like to continue antibiotics until discharge.   Name of physician (or Provider) Contacted: Dr. Loanne Drilling  Current antibiotics: meropenem  Changes to prescribed antibiotics recommended:  Narrow meropenem to ceftriaxone. Recommendations accepted by provider     Benetta Spar, PharmD, BCPS, [place] Clinical Pharmacist  Please check AMION for all [place] phone numbers After 10:00 PM, call [place] [phone]

## 2022-01-14 NOTE — Progress Notes (Signed)
[place] Progress Note Patient Name: [name] [name] DOB: [birth year] MRN: [id number]   Date of Service  01/14/2022  HPI/Events of Note  1 of 4 blood cultures with Kleb pneumoniae Hx ESBL in 2020 Currently on meropenem Transferred to floor today  eICU Interventions  De-escalate to Ceftriaxone due to clinical stability     Intervention Category Major Interventions: Other:  Jaedan Huttner Rodman Pickle 01/14/2022, 10:05 PM

## 2022-01-14 NOTE — Progress Notes (Signed)
PHARMACY NOTE:  ANTIMICROBIAL RENAL DOSAGE ADJUSTMENT  Current antimicrobial regimen includes a mismatch between antimicrobial dosage and estimated renal function.  As per policy approved by the Pharmacy & Therapeutics and Medical Executive Committees, the antimicrobial dosage will be adjusted accordingly.  Current antimicrobial dosage:  Meropenem 1 gm IV q12hr  Indication: Probable ESBL infection  Renal Function:  Estimated Creatinine Clearance: 55.6 mL/min (by C-G formula based on SCr of 0.92 mg/dL).    Antimicrobial dosage has been changed to:  Meropenem 1 gm IV q8hr  Thank you for allowing pharmacy to be a part of this patient's care.  Corinda Gubler, [facility] 01/14/2022 12:52 PM

## 2022-01-14 NOTE — Progress Notes (Signed)
NAME:  [name] [name], MRN:  [id number], DOB:  07-01-1945, LOS: 1 ADMISSION DATE:  01/13/2022, CONSULTATION DATE:  01/13/22 REFERRING MD:  Ronnald Nian, CHIEF COMPLAINT:  AMS   History of Present Illness:  76 year old woman with hx of Parkinson's, recurrent UTI, VTE on coumadin, progressive dementia and FTT who presents from nursing home with worsened AMS. Found to be in septic shock.  Has history of kidney stones so renal stone study done showing no obstruction but did reveal a large R breast/chest wall hematoma.  INR 6 so given vitamin K.  Pressures remain low despite fluids.  Family wants trial of pressors so admitting to ICU. Patient is DNR.  Pertinent  Medical History   Past Medical History:  Diagnosis Date   Acute metabolic encephalopathy 72/53/6644   Adenoma of right adrenal gland 02/20/2018   AKI (acute kidney injury) 02/12/2017   Arthritis    Atypical parkinsonism state 05/17/2015   Benign essential hypertension    Complication of anesthesia    severe itching night of surgery requiring meds- also couldnt swallow- throat "paralyzed""   Displaced fracture of fifth cervical vertebra 12/17/2015   Fibromyalgia    Gait disturbance 12/17/2015   Glaucoma 10/13/2017   Hematuria 04/05/2016   History of prediabetes 10/13/2017   History of pulmonary embolus (PE) 2/2 hypercoagulable state 11/23/2016   History of sepsis 09/17/2016   History of TIA (transient ischemic attack) 10/13/2017   Hypercholesterolemia    Hyperlipidemia    Hyponatremia 01/03/2019   Hypothyroidism    s/p graves disease   Kidney stone    Left ventricular diastolic dysfunction 03/47/4259   Major neurocognitive disorder 02/23/2020   Osteopenia 12/18/2017   Peripheral vascular disease    PULMONARY EMBOLUS x 2- 2011, 2012/ FOLLOWED BY DR ODOGWU-LOV 12/12 EPIC   PT STAES WILL STOP COUMADIN 3/12 and begin LOVONOX 05/17/11 as previously instructed   Pulmonary hypertension 11/23/2016   Recurrent UTI 11/23/2016   S/P right  TKA 05/22/2011   Sinus infection    SIRS (systemic inflammatory response syndrome) 04/29/2016   Subtherapeutic international normalized ratio (INR) 01/03/2019   Trochanteric bursitis of both hips 09/13/2015     Significant Hospital Events: Including procedures, antibiotic start and stop dates in addition to other pertinent events   01/13/22 admit  Interim History / Subjective:  No events. Still states she feels lousy but is unable to elaborate further. Denies abd pain. Levophed needs improved.  Objective   Blood pressure (!) 129/54, pulse 62, temperature 97.6 F (36.4 C), temperature source Oral, resp. rate (!) 21, height '5\' 6"'$  (1.676 m), weight 85.9 kg, SpO2 97 %.        Intake/Output Summary (Last 24 hours) at 01/14/2022 1209 Last data filed at 01/14/2022 1100 Gross per 24 hour  Intake 3879.17 ml  Output 400 ml  Net 3479.17 ml    Filed Weights   01/13/22 1342 01/14/22 0500  Weight: 81.6 kg 85.9 kg    Examination: No distress Flat affect Moves ext to command MM remains dry Abd soft, +BS Lungs clear Stable bruising R breast H/H stable from last night  Resolved Hospital Problem list   N/A  Assessment & Plan:  Septic shock presumed urinary source, less likely hemorrhagic given size of hematoma  Septic encephalopathy- improving, baseline dementia and Parkinson's clouding picutre  ABLA from breast hematoma- has hx of falls but unclear when this occurred: stable H/H overnight  Acute kidney injury from dehydration and ? Septic ATN, improving  Progressive FTT from  Parkinson's, dementia, general debility and inability to care for self- PMT speaking with family today  Hx ESBL infections  Hx of recurrent VTE on coumadin PTA- supratherapeutic on admit, s/p vitamin K  - Meropenem, f/u culture data - Levophed peripheral protocol - LR as ordered, avoid nephrotoxins - Hold on AC until we assure H/H stable, INR 1.9 this am - Appreciate PMT help with family as  patient seems to be nearing EOL  Best Practice (right click and "Reselect all SmartList Selections" daily)   Diet/type: bedside swallow eval then advance as tolerated DVT prophylaxis: SCD GI prophylaxis: N/A Lines: N/A Foley:  Yes, and it is still needed Code Status:  DNR Last date of multidisciplinary goals of care discussion [01/13/22]  32 min cc time independent of procedures

## 2022-01-14 NOTE — Consult Note (Cosign Needed Addendum)
Consultation Note Date: 01/14/2022   Patient Name: [name] [name]  DOB: September 29, 1945  MRN: [id number]  Age / Sex: 76 y.o., female  PCP: [name], [name], MD Referring Physician: Candee Furbish, MD  Reason for Consultation: Establishing goals of care, "FTT, hospice?"  HPI/Patient Profile: 76 y.o. female  with past medical history of Parkinson's, recurrent UTI, VTE on coumadin, progressive dementia, and FTT presented to ED on 01/13/2022 from [facility] assisted living facility with AMS.  Patient was admitted on 01/13/2022 with septic shock likely secondary to UTI, septic encephalopathy, acute blood loss anemia from breast hematoma, AKI, progressive failure to thrive, history of ESBL infections.    Clinical Assessment and Goals of Care: I have reviewed medical records including EPIC notes, labs, and imaging. Received report from primary RN -no acute concerns.  Plan is to discontinue pressors this morning.  RN reports patient is pleasantly confused.  Went to visit patient at bedside -no family/visitors present. Patient was lying in bed awake, alert, oriented to self only, and able to participate in simple conversation.  She is not able to make complex medical decisions.  No signs or non-verbal gestures of pain or discomfort noted. No respiratory distress, increased work of breathing, or secretions noted.  Patient is receiving a bath.  Patient endorses pain "all over."  She reports "not feeling good."  RN that is present at bedside will administer pain medication.  Bruise to right breast noted.  Called patient's daughter/[name] to discuss diagnosis, prognosis, GOC, EOL wishes, disposition, and options.  [name] [name] confirms she is patient's HCPOA.  I introduced Palliative Medicine as specialized medical care for people living with serious illness. It focuses on providing relief from the symptoms and stress of a serious illness.  The goal is to improve quality of life for both the patient and the family.  We discussed a brief life review of the patient as well as functional and nutritional status.  Patient was working up until 2017.  She is widowed -she has 1 daughter and 1 son.  Patient was living in independent living until 2020; she transitioned to assisted living facility in early 2021.  Patient has been wheelchair-bound since this time and has gotten progressively harder to transfer her.  [name] [name] tells me that patient was admitted to [facility] in October 2023 for UTI and was subsequently discharged to a long-term care facility; however, became increasingly depressed and not doing well so family moved her back to the assisted living facility that she enjoyed as the facility noted they could support her needs.  Patient has continued to decline over the last several months -[name] [name] tells me she is required more and more help with ADLs.  Her appetite has been very poor.  Albumin noted at 2.0 on 01/13/2022.  [name] [name] tells me patient has had a steady decline since 2021.  [name] [name] describes her mother as "strong-willed" and "very independent."  Patient has told [name] [name] that "I do not want to be here anymore."  [name] [name] recognizes that patient's "quality of life is not where she would want it to be."  She asked me "when is enough, enough?"  We discussed patient's current illness and what it means in the larger context of patient's on-going co-morbidities.  [name] [name] has a clear understanding that Parkinson's is a noncurable, progressive neurological disease.  She also has a clear understanding of patient's current acute medical situation.  Natural disease trajectory and expectations at EOL were discussed. I attempted to elicit values and goals of care important  to the patient. The difference between aggressive medical intervention and comfort care was considered in light of the patient's goals of care.  [name] [name] feels that patient would not want her  life prolonged in her current state and would not want to be rehospitalized.  Provided education and counseling at length on the philosophy and benefits of hospice care. Discussed that it offers a holistic approach to care in the setting of end-stage illness, and is about supporting the patient where they are allowing nature to take it's course. Discussed the hospice team includes RNs, physicians, social workers, and chaplains. They can provide personal care, support for the family, and help keep patient out of the hospital as well as assist with DME needs for home hospice. Education provided on the difference between home vs residential hospice.  [name] [name] is interested in patient transfer to residential hospice facility, requesting [facility] location as this is close to her home.  If patient is not accepted to residential hospice facility she is okay with patient returning to [facility] with hospice support; however, understands that [facility] may not be able to provide patient with total care she may need at discharge and would need to look into alternative long-term care facilities.  Reviewed hospice services in context of Medicare benefit to the best of my ability per [facility] request.  We talked about transition to comfort measures in house and what that would entail inclusive of medications to control pain, dyspnea, agitation, nausea, and itching. We discussed stopping all unnecessary measures such as blood draws, needle sticks, oxygen, antibiotics, CBGs/insulin, cardiac monitoring, IVF, and frequent vital signs. Education provided that other non-pharmacological interventions would be utilized for holistic support and comfort such as spiritual support if requested, repositioning, music therapy, offering comfort feeds, and/or therapeutic listening.  [name] [name] tells me she is "leaning toward comfort care option" but wants to speak with her brother prior to making final decisions.  Goal  at this time is to continue current supportive treatment without escalation of care until she speaks with her brother.   Visit also consisted of discussions dealing with the complex and emotionally intense issues of symptom management and palliative care in the setting of serious and potentially life-threatening illness.   Discussed with [name] [name] the importance of continued conversation with family and the medical providers regarding overall plan of care and treatment options, ensuring decisions are within the context of the patient's values and GOCs.    Questions and concerns were addressed. The patient/family was encouraged to call with questions and/or concerns. PMT number was provided.  Spoke with [facility] liaison via phone to provide referral.   Primary Decision Maker: HCPOA/daughter/[name] [name]    SUMMARY OF RECOMMENDATIONS   Continue current supportive treatment without escalation of care - daughter/HCPOA is considering patient's transition to full comfort care but wishes to speak with her brother prior to making final decisions Continue DNR/DNI as previously documented Daughter is interested in patient's transfer to [facility] location - [facility] consult placed; TOC and hospice liaison notified If not accepted to residential hospice facility daughter is open to patient returning to [facility] with hospice; however, will need to ensure this ALF can support patient's needs with hospice at discharge.  May need LTC PMT will continue to follow and support holistically   **ADDENDUM** 1:30 PM Received notification [name] [name] called PMT phone indicating desire to transition patient to full comfort measures today.   2:03 PM Returned [name]'s call -  she confirms she and her brother are agreeable for patient's transition to full comfort today. Will keep antibiotics until patient discharges to hospice/with hospice. [name] [name] asks for resources to support her in  discussing death/dying to her children - will find resources/speak with hospice liaison.  Updated Recommendations: Initiated full comfort measures Continue DNR/DNI as previously documented [facility] [place] eval pending Added orders for EOL symptom management and to reflect full comfort measures, as well as discontinued orders that were not focused on comfort Unrestricted visitation orders were placed per current [facility] EOL visitation policy  Nursing to provide frequent assessments and administer PRN medications as clinically necessary to ensure EOL comfort PMT will continue to follow and support holistically  Symptom Management Continue antibiotics until patient discharges  Morphine PRN pain/dyspnea/increased work of breathing/RR>25 Tylenol PRN pain/fever Biotin twice daily Benadryl PRN itching Robinul PRN secretions Haldol PRN agitation/delirium Ativan PRN anxiety/seizure/sleep/distress Zofran PRN nausea/vomiting Liquifilm Tears PRN dry eye   Code Status/Advance Care Planning: DNR  Palliative Prophylaxis:  Aspiration, Bowel Regimen, Frequent Pain Assessment, Oral Care, and Turn Reposition  Additional Recommendations (Limitations, Scope, Preferences): Avoid Hospitalization, Full Scope Treatment, No Artificial Feeding, and No Tracheostomy  Psycho-social/Spiritual:  Desire for further Chaplaincy support:no Created space and opportunity for patient and family to express thoughts and feelings regarding patient's current medical situation.  Emotional support and therapeutic listening provided.  Prognosis:  Poor in the setting of advanced age, dementia, recurrent hospitalizations, malnutrition/FTT, and multiple comorbidities   Discharge Planning: Hospice facility  vs home hospice     Primary Diagnoses: Present on Admission:  Septic shock ([place])   I have reviewed the medical record, interviewed the patient and family, and examined the patient. The  following aspects are pertinent.  Past Medical History:  Diagnosis Date   Acute metabolic encephalopathy 47/82/9562   Adenoma of right adrenal gland 02/20/2018   AKI (acute kidney injury) 02/12/2017   Arthritis    Atypical parkinsonism state 05/17/2015   Benign essential hypertension    Complication of anesthesia    severe itching night of surgery requiring meds- also couldnt swallow- throat "paralyzed""   Displaced fracture of fifth cervical vertebra 12/17/2015   Fibromyalgia    Gait disturbance 12/17/2015   Glaucoma 10/13/2017   Hematuria 04/05/2016   History of prediabetes 10/13/2017   History of pulmonary embolus (PE) 2/2 hypercoagulable state 11/23/2016   History of sepsis 09/17/2016   History of TIA (transient ischemic attack) 10/13/2017   Hypercholesterolemia    Hyperlipidemia    Hyponatremia 01/03/2019   Hypothyroidism    s/p graves disease   Kidney stone    Left ventricular diastolic dysfunction 13/10/6576   Major neurocognitive disorder 02/23/2020   Osteopenia 12/18/2017   Peripheral vascular disease    PULMONARY EMBOLUS x 2- 2011, 2012/ FOLLOWED BY DR ODOGWU-LOV 12/12 EPIC   PT STAES WILL STOP COUMADIN 3/12 and begin LOVONOX 05/17/11 as previously instructed   Pulmonary hypertension 11/23/2016   Recurrent UTI 11/23/2016   S/P right TKA 05/22/2011   Sinus infection    SIRS (systemic inflammatory response syndrome) 04/29/2016   Subtherapeutic international normalized ratio (INR) 01/03/2019   Trochanteric bursitis of both hips 09/13/2015   Social History   Socioeconomic History   Marital status: Widowed    Spouse name: Not on file   Number of children: 2   Years of education: 12   Highest education level: High school graduate  Occupational History   Occupation: Retired  Tobacco Use   Smoking status:  Former    Packs/day: 1.00    Types: Cigarettes    Quit date: 05/10/1989    Years since quitting: 32.7   Smokeless tobacco: Never  Vaping Use   Vaping Use:  Never used  Substance and Sexual Activity   Alcohol use: Not Currently   Drug use: No   Sexual activity: Not on file  Other Topics Concern   Not on file  Social History Narrative   Right handed   Lives at home alone   Drinks caffeine occasionally    Social Determinants of Health   Financial Resource Strain: Not on file  Food Insecurity: Not on file  Transportation Needs: Not on file  Physical Activity: Not on file  Stress: Not on file  Social Connections: Not on file   Family History  Problem Relation Age of Onset   Prostate cancer Father    Stroke Father    Memory loss Sister    Scheduled Meds:  Chlorhexidine Gluconate Cloth  6 each Topical Q0600   mupirocin ointment  1 Application Nasal BID   mouth rinse  15 mL Mouth Rinse 4 times per day   Continuous Infusions:  sodium chloride 20 mL/hr at 01/14/22 0600   meropenem (MERREM) IV Stopped (01/14/22 0526)   norepinephrine (LEVOPHED) Adult infusion 3 mcg/min (01/14/22 0600)   PRN Meds:.docusate sodium, ondansetron (ZOFRAN) IV, mouth rinse, polyethylene glycol, white petrolatum Medications Prior to Admission:  Prior to Admission medications   Medication Sig Start Date End Date Taking? Authorizing Provider  acetaminophen (TYLENOL) 500 MG tablet Take 500 mg by mouth every 6 (six) hours as needed for mild pain.   Yes [provider]  amLODipine (NORVASC) 2.5 MG tablet TAKE 1 TABLET BY MOUTH ONCE DAILY Patient taking differently: Take 2.5 mg by mouth at bedtime. 04/23/19  Yes [name], [name], MD  aspirin EC 81 MG tablet Take 81 mg by mouth daily. Swallow whole.   Yes [provider]  atorvastatin (LIPITOR) 10 MG tablet TAKE 1 TABLET BY MOUTH ONCE DAILY Patient taking differently: Take 10 mg by mouth daily. 04/23/19  Yes [name], [name], MD  carbidopa-levodopa (SINEMET IR) 25-100 MG tablet Take 2 tablets at 7am/1 at 11am/1 at 4pm Patient taking differently: Take 2 tablets by mouth 3 (three) times daily.  06/28/21  Yes Tat, Eustace Quail, DO  Cranberry 425 MG CAPS Take 1 capsule by mouth daily.   Yes [provider]  donepezil (ARICEPT) 10 MG tablet Take 1 tablet (10 mg total) by mouth at bedtime. 06/14/20  Yes Tat, Eustace Quail, DO  HYDROcodone-acetaminophen (NORCO/VICODIN) 5-325 MG tablet Take 1 tablet by mouth every 8 (eight) hours as needed for moderate pain.   Yes [provider]  lactobacillus acidophilus (BACID) TABS tablet Take 1 tablet by mouth 2 (two) times daily.   Yes [provider]  melatonin 3 MG TABS tablet Take 3 mg by mouth at bedtime.   Yes [provider]  metFORMIN (GLUCOPHAGE) 500 MG tablet Take 500 mg by mouth 2 (two) times daily with a meal.   Yes [provider]  metFORMIN (GLUCOPHAGE) 500 MG tablet Take 500 mg by mouth 2 (two) times daily. 11/14/20  Yes [provider]  QUEtiapine (SEROQUEL) 25 MG tablet Take 12.5-25 mg by mouth See admin instructions. Take 12.5 mg (1/2 tablet) by mouth in the morning, and 25 mg (1 tablet) at bedtime. 12/16/20  Yes [provider]  sertraline (ZOLOFT) 100 MG tablet Take 100 mg by mouth  daily. 12/07/20  Yes [provider]  levothyroxine (SYNTHROID) 125 MCG tablet Take 125 mcg by mouth daily before breakfast. Patient not taking: Reported on 01/13/2022    [provider]  warfarin (COUMADIN) 1 MG tablet Take by mouth. Patient not taking: Reported on 01/13/2022 12/14/20   [provider]  warfarin (COUMADIN) 3 MG tablet Take 3 mg by mouth daily. Patient not taking: Reported on 01/13/2022 12/14/20   [provider]   Allergies  Allergen Reactions   Crestor [Rosuvastatin Calcium] Other (See Comments)    Feeling very bad, aching all over.   Erythromycin Hives   Gabapentin Other (See Comments)    Blurred vision   Pregabalin Other (See Comments)    Crossed vision.   Pregabalin Other (See Comments)    Crossed vision. Unknown    Macrobid  [Nitrofurantoin] Hives   Losartan Itching   Review of Systems  Unable to perform ROS: Dementia    Physical Exam Vitals and nursing note reviewed.  Constitutional:      General: She is not in acute distress. Pulmonary:     Effort: No respiratory distress.  Skin:    General: Skin is warm and dry.     Findings: Ecchymosis (right breast) present.  Neurological:     Mental Status: She is alert. She is disoriented and confused.     Motor: Weakness present.  Psychiatric:        Attention and Perception: Attention normal.        Behavior: Behavior is cooperative.        Cognition and Memory: Cognition is impaired. Memory is impaired.     Vital Signs: BP (!) 129/46   Pulse 60   Temp 97.6 F (36.4 C) (Oral)   Resp 20   Ht '5\' 6"'$  (1.676 m)   Wt 85.9 kg   SpO2 97%   BMI 30.57 kg/m  Pain Scale: 0-10   Pain Score: 0-No pain   SpO2: SpO2: 97 % O2 Device:SpO2: 97 % O2 Flow Rate: .   IO: Intake/output summary:  Intake/Output Summary (Last 24 hours) at 01/14/2022 1051 Last data filed at 01/14/2022 0600 Gross per 24 hour  Intake 2663.27 ml  Output 400 ml  Net 2263.27 ml    LBM:   Baseline Weight: Weight: 81.6 kg Most recent weight: Weight: 85.9 kg     Palliative Assessment/Data: PPS 20%     Time In: 1040 Time Out: 1210 Additional time per addendum: 25 minutes Time Total: 115 minutes   Greater than 50%  of this time was spent counseling and coordinating care related to the above assessment and plan.  Signed by: Lin Landsman, NP   Please contact Palliative Medicine Team phone at [phone] for questions and concerns.  For individual provider: See Amion  *Portions of this note are a verbal dictation therefore any spelling and/or grammatical errors are due to the "[employer]" system interpretation.

## 2022-01-15 DIAGNOSIS — Z515 Encounter for palliative care: Secondary | ICD-10-CM | POA: Diagnosis not present

## 2022-01-15 DIAGNOSIS — A419 Sepsis, unspecified organism: Secondary | ICD-10-CM | POA: Diagnosis not present

## 2022-01-15 DIAGNOSIS — R6521 Severe sepsis with septic shock: Secondary | ICD-10-CM | POA: Diagnosis not present

## 2022-01-15 DIAGNOSIS — N3000 Acute cystitis without hematuria: Secondary | ICD-10-CM | POA: Diagnosis not present

## 2022-01-15 DIAGNOSIS — R791 Abnormal coagulation profile: Secondary | ICD-10-CM | POA: Diagnosis not present

## 2022-01-15 NOTE — Progress Notes (Signed)
Throughout the shift, when this nurse asks patient how she is feeling, the patient replies "not worth a dern." This nurse will offer pain medication to the patient and the patient has responded with "yes." When the patient is asked where the pain is located, she stares blankly at this nurse. This nurse has volunteered "are you hurting all over?" And patient has replied "yes." Pain medication given per MAR.

## 2022-01-15 NOTE — Progress Notes (Addendum)
PROGRESS NOTE    [name] [name]  HYW:[id number] DOB: [birth year] DOA: 01/13/2022 PCP: Darreld Mclean, MD   Brief Narrative: 76 year old with past medical history significant for Parkinson's, recurring UTI, PTE on Coumadin, progressive dementia and failure to thrive who presents from nursing home with worsening altered mental status.  Patient was found to be in septic shock, has a history of kidney stones or renal stone study done showed no obstruction but did reveal a large right breast chest wall hematoma.  INR was 6.  Patient received vitamin K.  Blood pressures remain low despite fluids.  Family wanted a trial of pressors so patient was admitted to the ICU.  Patient was admitted with septic shock presumed urinary source, less likely hemorrhagic given size of hematoma.  Septic encephalopathy, in the setting of acute infectious process dementia and Parkinson disease.  Acute blood loss anemia from hematoma.  She was treated with IV antibiotics Levophed.  Subsequently [facility].  Palliative care consulted and plan is to proceed with comfort care, but continue IV antibiotics until transition to facility with hospice care.     Assessment & Plan:   Principal Problem:   Septic shock ([place])  1-Septic shock: Klebsiella pneumoniae bacteremia -Patient was treated with IV fluids, IV Levophed, IV antibiotics. -Initially on meropenem transition to ceftriaxone on 11/11 -Final culture report. -Palliative  care following, plan to proceed with comfort care.  Continue with IV antibiotics for now  Acute metabolic encephalopathy in the setting of sepsis, septic encephalopathy Support care.  Ativan / haldol PRN agitation.   Acute blood loss anemia secondary to breast hematoma, History of fall unclear when this occurred.  Monitor hb.  Holding coumadin  INR was reversed.    Acute kidney injury: The setting of sepsis, ATN Resolved. Treated with Fluids.   Hypernatremia; in setting  poor oral intake.   Progressive failure to thrive in the setting of Parkinson disease, dementia, Poor oral intake.    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 Plan was to hold anticoagulation until hemoglobin is stable.  Now transition to comfort care.     Estimated body mass index is 30.57 kg/m as calculated from the following:   Height as of this encounter: '5\' 6"'$  (1.676 m).   Weight as of this encounter: 85.9 kg.   DVT prophylaxis: SCD Code Status: DNR Family Communication: No family at bedside. Palliative care following and updating family  Disposition Plan:  Status is: Inpatient Remains inpatient appropriate because: SNF with hospice care.     Consultants:  CCM Palliative  Procedures:  none  Antimicrobials:    Subjective: She is alert, denies pain, does not want to eat. Poor appetite.   Objective: Vitals:   01/14/22 1730 01/14/22 1938 01/14/22 2338 01/15/22 0635  BP:  (!) 106/46 (!) 112/51 (!) 109/53  Pulse: 72 72 66 60  Resp: (!) '21 20 16 17  '$ Temp:  97.6 F (36.4 C) (!) 97.4 F (36.3 C) (!) 97.4 F (36.3 C)  TempSrc:  Axillary Oral Axillary  SpO2: 98% 91% 99% 98%  Weight:      Height:        Intake/Output Summary (Last 24 hours) at 01/15/2022 0728 Last data filed at 01/15/2022 0658 Gross per 24 hour  Intake 1157.71 ml  Output --  Net 1157.71 ml   Filed Weights   01/13/22 1342 01/14/22 0500  Weight: 81.6 kg 85.9 kg    Examination:  General exam: Appears calm and comfortable  Respiratory system: Clear to auscultation. Respiratory effort normal. Cardiovascular system: S1 & S2 heard, RRR. Gastrointestinal system: Abdomen is nondistended, soft and nontender. No organomegaly or masses felt. Normal bowel sounds heard. Central nervous system: Alert Extremities: no edema   Data Reviewed: I have personally reviewed following labs and imaging studies  CBC: Recent Labs  Lab 01/13/22 1305 01/13/22 1941  01/14/22 0204 01/14/22 1259  WBC 28.8* 22.8* 26.0* 18.2*  NEUTROABS 25.2*  --   --   --   HGB 9.8* 7.7* 7.6* 7.5*  HCT 31.8* 25.3* 25.0* 25.2*  MCV 87.8 87.2 89.9 89.7  PLT 522* 420* 441* 970   Basic Metabolic Panel: Recent Labs  Lab 01/13/22 1305 01/14/22 0204  NA 152* 149*  K 4.2 3.4*  CL 114* 116*  CO2 23 24  GLUCOSE 214* 188*  BUN 27* 24*  CREATININE 1.24* 0.92  CALCIUM 8.7* 7.8*   GFR: Estimated Creatinine Clearance: 55.6 mL/min (by C-G formula based on SCr of 0.92 mg/dL). Liver Function Tests: Recent Labs  Lab 01/13/22 1305  AST 21  ALT 6  ALKPHOS 119  BILITOT 0.7  PROT 6.5  ALBUMIN 2.0*   No results for input(s): "LIPASE", "AMYLASE" in the last 168 hours. No results for input(s): "AMMONIA" in the last 168 hours. Coagulation Profile: Recent Labs  Lab 01/13/22 1305 01/13/22 1941 01/14/22 0204  INR 6.4* 4.1* 1.9*   Cardiac Enzymes: No results for input(s): "CKTOTAL", "CKMB", "CKMBINDEX", "TROPONINI" in the last 168 hours. BNP (last 3 results) No results for input(s): "PROBNP" in the last 8760 hours. HbA1C: No results for input(s): "HGBA1C" in the last 72 hours. CBG: Recent Labs  Lab 01/13/22 2324 01/14/22 0339 01/14/22 0759 01/14/22 1155 01/14/22 1547  GLUCAP 146* 173* 135* 140* 183*   Lipid Profile: No results for input(s): "CHOL", "HDL", "LDLCALC", "TRIG", "CHOLHDL", "LDLDIRECT" in the last 72 hours. Thyroid Function Tests: No results for input(s): "TSH", "T4TOTAL", "FREET4", "T3FREE", "THYROIDAB" in the last 72 hours. Anemia Panel: No results for input(s): "VITAMINB12", "FOLATE", "FERRITIN", "TIBC", "IRON", "RETICCTPCT" in the last 72 hours. Sepsis Labs: Recent Labs  Lab 01/13/22 1305 01/13/22 1500 01/13/22 1704  PROCALCITON  --   --  4.93  LATICACIDVEN 5.5* 4.7*  --     Recent Results (from the past 240 hour(s))  Resp Panel by RT-PCR (Flu A&B, Covid) Anterior Nasal Swab     Status: None   Collection Time: 01/13/22  1:25 PM    Specimen: Anterior Nasal Swab  Result Value Ref Range Status   SARS Coronavirus 2 by RT PCR NEGATIVE NEGATIVE Final    Comment: (NOTE) SARS-CoV-2 target nucleic acids are NOT DETECTED.  The SARS-CoV-2 RNA is generally detectable in upper respiratory specimens during the acute phase of infection. The lowest concentration of SARS-CoV-2 viral copies this assay can detect is 138 copies/mL. A negative result does not preclude SARS-Cov-2 infection and should not be used as the sole basis for treatment or other patient management decisions. A negative result may occur with  improper specimen collection/handling, submission of specimen other than nasopharyngeal swab, presence of viral mutation(s) within the areas targeted by this assay, and inadequate number of viral copies(<138 copies/mL). A negative result must be combined with clinical observations, patient history, and epidemiological information. The expected result is Negative.  Fact Sheet for Patients:  EntrepreneurPulse.com.au  Fact Sheet for Healthcare Providers:  IncredibleEmployment.be  This test is no t yet approved or cleared by the [place] FDA and  has been authorized for detection  and/or diagnosis of SARS-CoV-2 by FDA under an Emergency Use Authorization (EUA). This EUA will remain  in effect (meaning this test can be used) for the duration of the COVID-19 declaration under Section 564(b)(1) of the Act, 21 U.S.C.section 360bbb-3(b)(1), unless the authorization is terminated  or revoked sooner.       Influenza A by PCR NEGATIVE NEGATIVE Final   Influenza B by PCR NEGATIVE NEGATIVE Final    Comment: (NOTE) The Xpert Xpress SARS-CoV-2/FLU/RSV plus assay is intended as an aid in the diagnosis of influenza from Nasopharyngeal swab specimens and should not be used as a sole basis for treatment. Nasal washings and aspirates are unacceptable for Xpert Xpress  SARS-CoV-2/FLU/RSV testing.  Fact Sheet for Patients: EntrepreneurPulse.com.au  Fact Sheet for Healthcare Providers: IncredibleEmployment.be  This test is not yet approved or cleared by the [place] FDA and has been authorized for detection and/or diagnosis of SARS-CoV-2 by FDA under an Emergency Use Authorization (EUA). This EUA will remain in effect (meaning this test can be used) for the duration of the COVID-19 declaration under Section 564(b)(1) of the Act, 21 U.S.C. section 360bbb-3(b)(1), unless the authorization is terminated or revoked.  Performed at [facility], [place] [address] [place], [place] [address]   Urine Culture     Status: Abnormal   Collection Time: 01/13/22  1:25 PM   Specimen: In/Out Cath Urine  Result Value Ref Range Status   Specimen Description IN/OUT CATH URINE  Final   Special Requests   Final    NONE Performed at [facility], [place] [address] [place], [place] [address]    Culture MULTIPLE SPECIES PRESENT, SUGGEST RECOLLECTION (A)  Final   Report Status 01/14/2022 FINAL  Final  Blood Culture (routine x 2)     Status: None (Preliminary result)   Collection Time: 01/13/22  3:00 PM   Specimen: BLOOD LEFT FOREARM  Result Value Ref Range Status   Specimen Description BLOOD LEFT FOREARM  Final   Special Requests   Final    BOTTLES DRAWN AEROBIC AND ANAEROBIC Blood Culture adequate volume   Culture  Setup Time   Final    ANAEROBIC BOTTLE ONLY GRAM NEGATIVE RODS Organism ID to follow CRITICAL RESULT CALLED TO, READ BACK BY AND VERIFIED WITH:  C/ PHARMD L. CHEN 01/14/22 2037 A. LAFRANCE    Culture   Final    NO GROWTH < 24 HOURS Performed at [facility], [place] [address] [place], [place] [address]    Report Status PENDING  Incomplete  Blood Culture ID Panel (Reflexed)     Status: Abnormal   Collection Time: 01/13/22  3:00 PM  Result Value Ref Range Status   Enterococcus faecalis NOT  DETECTED NOT DETECTED Final   Enterococcus Faecium NOT DETECTED NOT DETECTED Final   Listeria monocytogenes NOT DETECTED NOT DETECTED Final   Staphylococcus species NOT DETECTED NOT DETECTED Final   Staphylococcus aureus (BCID) NOT DETECTED NOT DETECTED Final   Staphylococcus epidermidis NOT DETECTED NOT DETECTED Final   Staphylococcus lugdunensis NOT DETECTED NOT DETECTED Final   Streptococcus species NOT DETECTED NOT DETECTED Final   Streptococcus agalactiae NOT DETECTED NOT DETECTED Final   Streptococcus pneumoniae NOT DETECTED NOT DETECTED Final   Streptococcus pyogenes NOT DETECTED NOT DETECTED Final   A.calcoaceticus-baumannii NOT DETECTED NOT DETECTED Final   Bacteroides fragilis NOT DETECTED NOT DETECTED Final   Enterobacterales DETECTED (A) NOT DETECTED Final    Comment: Enterobacterales represent a large order of gram  negative bacteria, not a single organism. CRITICAL RESULT CALLED TO, READ BACK BY AND VERIFIED WITH:  C/ PHARMD L. CHEN 01/14/22 2037 A. LAFRANCE    Enterobacter cloacae complex NOT DETECTED NOT DETECTED Final   Escherichia coli NOT DETECTED NOT DETECTED Final   Klebsiella aerogenes NOT DETECTED NOT DETECTED Final   Klebsiella oxytoca NOT DETECTED NOT DETECTED Final   Klebsiella pneumoniae DETECTED (A) NOT DETECTED Final    Comment: CRITICAL RESULT CALLED TO, READ BACK BY AND VERIFIED WITH:  C/ PHARMD L. CHEN 01/14/22 2037 A. LAFRANCE    Proteus species NOT DETECTED NOT DETECTED Final   Salmonella species NOT DETECTED NOT DETECTED Final   Serratia marcescens NOT DETECTED NOT DETECTED Final   Haemophilus influenzae NOT DETECTED NOT DETECTED Final   Neisseria meningitidis NOT DETECTED NOT DETECTED Final   Pseudomonas aeruginosa NOT DETECTED NOT DETECTED Final   Stenotrophomonas maltophilia NOT DETECTED NOT DETECTED Final   Candida albicans NOT DETECTED NOT DETECTED Final   Candida auris NOT DETECTED NOT DETECTED Final   Candida glabrata NOT DETECTED NOT  DETECTED Final   Candida krusei NOT DETECTED NOT DETECTED Final   Candida parapsilosis NOT DETECTED NOT DETECTED Final   Candida tropicalis NOT DETECTED NOT DETECTED Final   Cryptococcus neoformans/gattii NOT DETECTED NOT DETECTED Final   CTX-M ESBL NOT DETECTED NOT DETECTED Final   Carbapenem resistance IMP NOT DETECTED NOT DETECTED Final   Carbapenem resistance KPC NOT DETECTED NOT DETECTED Final   Carbapenem resistance NDM NOT DETECTED NOT DETECTED Final   Carbapenem resist OXA 48 LIKE NOT DETECTED NOT DETECTED Final   Carbapenem resistance VIM NOT DETECTED NOT DETECTED Final    Comment: Performed at [facility], [address] [place], [place] [address]  Respiratory (~20 pathogens) panel by PCR     Status: None   Collection Time: 01/13/22  5:05 PM   Specimen: Anterior Nasal Swab; Respiratory  Result Value Ref Range Status   Adenovirus NOT DETECTED NOT DETECTED Final   Coronavirus 229E NOT DETECTED NOT DETECTED Final    Comment: (NOTE) The Coronavirus on the Respiratory Panel, DOES NOT test for the novel  Coronavirus (2019 nCoV)    Coronavirus HKU1 NOT DETECTED NOT DETECTED Final   Coronavirus NL63 NOT DETECTED NOT DETECTED Final   Coronavirus OC43 NOT DETECTED NOT DETECTED Final   Metapneumovirus NOT DETECTED NOT DETECTED Final   Rhinovirus / Enterovirus NOT DETECTED NOT DETECTED Final   Influenza A NOT DETECTED NOT DETECTED Final   Influenza B NOT DETECTED NOT DETECTED Final   Parainfluenza Virus 1 NOT DETECTED NOT DETECTED Final   Parainfluenza Virus 2 NOT DETECTED NOT DETECTED Final   Parainfluenza Virus 3 NOT DETECTED NOT DETECTED Final   Parainfluenza Virus 4 NOT DETECTED NOT DETECTED Final   Respiratory Syncytial Virus NOT DETECTED NOT DETECTED Final   Bordetella pertussis NOT DETECTED NOT DETECTED Final   Bordetella Parapertussis NOT DETECTED NOT DETECTED Final   Chlamydophila pneumoniae NOT DETECTED NOT DETECTED Final   Mycoplasma pneumoniae NOT DETECTED NOT  DETECTED Final    Comment: Performed at [facility], [place]. [address], [place], [place] [address]  MRSA Next Gen by PCR, Nasal     Status: Abnormal   Collection Time: 01/13/22  7:02 PM   Specimen: Nasal Mucosa; Nasal Swab  Result Value Ref Range Status   MRSA by PCR Next Gen DETECTED (A) NOT DETECTED Final    Comment: RESULT CALLED TO, READ BACK BY AND VERIFIED WITH:  L [email or web address],RN'@2218'$  01/13/22 [place] (NOTE) The GeneXpert MRSA Assay (FDA approved for NASAL specimens only), is one component of a comprehensive MRSA colonization surveillance program. It is not intended to diagnose MRSA infection nor to guide or monitor treatment for MRSA infections. Test performance is not FDA approved in patients less than 60 years old. Performed at [facility], [place] [address] [place], [place] [address]   Blood Culture (routine x 2)     Status: None (Preliminary result)   Collection Time: 01/13/22  7:48 PM   Specimen: BLOOD  Result Value Ref Range Status   Specimen Description BLOOD SITE NOT SPECIFIED  Final   Special Requests   Final    BOTTLES DRAWN AEROBIC AND ANAEROBIC Blood Culture results may not be optimal due to an excessive volume of blood received in culture bottles   Culture   Final    NO GROWTH < 12 HOURS Performed at [facility], [place] [address], [place], [place] [address]    Report Status PENDING  Incomplete         Radiology Studies: CT Renal Stone Study  Result Date: 01/13/2022 CLINICAL DATA:  Flank pain EXAM: CT ABDOMEN AND PELVIS WITHOUT CONTRAST TECHNIQUE: Multidetector CT imaging of the abdomen and pelvis was performed following the standard protocol without IV contrast. RADIATION DOSE REDUCTION: This exam was performed according to the departmental dose-optimization program which includes automated exposure control, adjustment of the mA and/or kV according to patient size and/or use of iterative reconstruction technique. COMPARISON:  10/13/2017 FINDINGS: Lower  chest: There is large hematoma measuring approximately 13.1 x 6.5 cm in the soft tissues of right anterior and lateral chest wall. Upper extent of this hematoma is not included in the images. There is abnormal enlargement of right pectoral muscles. Coronary artery calcifications are seen. Small hiatal hernia is seen. There is small right pleural effusion. There are linear densities in the posterior lower lung fields suggesting subsegmental atelectasis. Hepatobiliary: Liver is enlarged measuring 21.9 cm in length. Surgical clips are seen in gallbladder fossa. There is no dilation of bile ducts. Pancreas: No focal abnormalities are seen. Spleen: Unremarkable. Adrenals/Urinary Tract: There are nodular densities in right adrenal measuring up to 2.3 cm. There is interval increase in size of the right adrenal nodules. These possible small 8 mm nodule in the left adrenal. There is no hydronephrosis. Right ureteral stent is seen. There is 3 mm calculus in the midportion of left kidney. Ureters are not dilated. There is tiny pocket of air in the urinary bladder, possibly due to recent instrumentation. Lower portion of the urinary bladder is below the level of pubic symphysis suggesting possible cystocele. Stomach/Bowel: Small hiatal hernia is seen. Stomach is not distended. Small bowel loops are not dilated. Appendix is not seen. There is no pericecal inflammation. There is no significant wall thickening in colon. There is no pericolic stranding. Vascular/Lymphatic: Extensive arterial calcifications are seen. No significant lymphadenopathy seen. Reproductive: Uterus is not seen. Other: There is no ascites or pneumoperitoneum. Bilateral inguinal hernias containing fat are seen. Musculoskeletal: Degenerative changes are noted in lumbar spine, more so at L5-S1 level. IMPRESSION: There is large hematoma in the anterior and lateral aspect of right chest wall. This may be due to trauma or underlying neoplastic process. Please  correlate with clinical symptoms and physical examination findings. Small right pleural effusion. Linear densities in right lower lung fields suggest subsegmental atelectasis. There is no hydronephrosis. Right ureteral stent is noted in place. Small calcification  in the left kidney may suggest nonobstructing calculus. There is no evidence of intestinal obstruction or pneumoperitoneum. Nodules are seen in both adrenals, more so on the right side with interval increase. This may be due to incidental adenomas or some other neoplastic process. Coronary artery disease. Small hiatal hernia. Status post cholecystectomy. Lumbar spondylosis. Other findings as described in the body of the report. Electronically Signed   By: Elmer Picker M.D.   On: 01/13/2022 15:50   CT Head Wo Contrast  Result Date: 01/13/2022 CLINICAL DATA:  Altered mental status EXAM: CT HEAD WITHOUT CONTRAST TECHNIQUE: Contiguous axial images were obtained from the base of the skull through the vertex without intravenous contrast. RADIATION DOSE REDUCTION: This exam was performed according to the departmental dose-optimization program which includes automated exposure control, adjustment of the mA and/or kV according to patient size and/or use of iterative reconstruction technique. COMPARISON:  12/08/2021 FINDINGS: Brain: No acute intracranial findings are seen. There are no signs of bleeding within the cranium. Cortical sulci are prominent. Cavum septum vergae and cavum septum pellucidum are noted. There is prominence of third and both lateral ventricles. There is decreased density in periventricular and subcortical white matter. Vascular: Unremarkable. Skull: Unremarkable. Sinuses/Orbits: Unremarkable. Other: None. IMPRESSION: No acute intracranial findings are seen. Atrophy. Small-vessel disease. Electronically Signed   By: Elmer Picker M.D.   On: 01/13/2022 15:28   DG Chest Port 1 View  Result Date: 01/13/2022 CLINICAL DATA:   Increased confusion, was supposed to start antibiotics for UTI but did not EXAM: PORTABLE CHEST 1 VIEW COMPARISON:  Portable exam 1333 hours compared to 03/13/2019 FINDINGS: Borderline enlargement of cardiac silhouette. Atherosclerotic calcification aorta. Mediastinal contours and pulmonary vascularity normal. Bronchitic changes with bibasilar atelectasis. No infiltrate, pleural effusion, or pneumothorax. Bones demineralized. IMPRESSION: Bibasilar atelectasis. Aortic Atherosclerosis (ICD10-I70.0). Electronically Signed   By: Lavonia Dana M.D.   On: 01/13/2022 13:50        Scheduled Meds:  antiseptic oral rinse  15 mL Topical BID   Continuous Infusions:  cefTRIAXone (ROCEPHIN)  IV 200 mL/hr at 01/15/22 0658     LOS: 2 days    Time spent: 35 minutes.     Elmarie Shiley, MD [facility]   If 7PM-7AM, please contact night-coverage [email or web address]  01/15/2022, 7:28 AM

## 2022-01-15 NOTE — Progress Notes (Signed)
Daily Progress Note   Patient Name: [name] [name]       Date: 01/15/2022 DOB: Jul 21, 1945  Age: 76 y.o. MRN#: [id number] Attending Physician: Elmarie Shiley, MD Primary Care Physician: Darreld Mclean, MD Admit Date: 01/13/2022  Reason for Consultation/Follow-up: Non pain symptom management, Pain control, Psychosocial/spiritual support, and Terminal Care  Subjective: Chart review performed.  Received report from primary RN -no acute concerns.  RN reports patient is awake, talking, with minimal oral intake.  Went to visit patient at bedside -no family/visitors present.  Patient is awake, alert, and able to participate in simple conversation. No signs or non-verbal gestures of pain or discomfort noted. No respiratory distress, increased work of breathing, or secretions noted.  Patient tells me that she "does not feel worth a durn."  She describes generalized discomfort.  Denies shortness of breath.  She asks for a milkshake.  Called daughter/Monica -emotional support provided.  Therapeutic listening provided as she describes feeling tired after staying with the patient overnight.  She reflects on concerns over nursing care last night -she is okay that I discuss concerns with current primary RN and charge nurse.  [name] has spoken with [facility] [place] liaison this morning -residential hospice evaluation pending.  Reviewed that if patient is not approved for residential hospice would look into patient returning to [name] with [place] still agreeable.  [name] feels that patient's "eating is troublesome."  Reviewed natural trajectory at end-of-life to include decreased appetite and oral intake.  All questions and concerns addressed. Encouraged to call with questions  and/or concerns. PMT card previously provided.  Requested primary RN administer dose of morphine for patient's generalized discomfort.  Also, discussed Monica's concerns with primary RN.  Called charge nurse and discussed concerns with them as well.  Made patient chocolate milkshake and return to bedside -son/Cliff present.  Emotional support and therapeutic listening provided.  Patient took sips of milkshake.  Per recent TOC note, patient was not eligible for residential hospice at this time but [facility] will admit patient for home hospice services at facility.  Length of Stay: 2  Current Medications: Scheduled Meds:   antiseptic oral rinse  15 mL Topical BID    Continuous Infusions:  cefTRIAXone (ROCEPHIN)  IV 200 mL/hr at 01/15/22 0658    PRN Meds:  acetaminophen **OR** acetaminophen, diphenhydrAMINE, glycopyrrolate **OR** glycopyrrolate **OR** glycopyrrolate, haloperidol **OR** haloperidol **OR** haloperidol lactate, LORazepam **OR** LORazepam **OR** LORazepam, morphine injection, ondansetron **OR** ondansetron (ZOFRAN) IV, polyethylene glycol, polyvinyl alcohol, white petrolatum  Physical Exam Vitals and nursing note reviewed.  Constitutional:      General: She is not in acute distress. Pulmonary:     Effort: No respiratory distress.  Skin:    General: Skin is warm and dry.  Neurological:     Mental Status: She is alert.     Motor: Weakness present.  Psychiatric:        Attention and Perception: Attention normal.        Behavior: Behavior is cooperative.             Vital Signs: BP (!) 109/53 (BP Location: Right Arm)   Pulse 60   Temp (!) 97.4 F (36.3 C) (Axillary)   Resp 17   Ht '5\' 6"'$  (1.676 m)   Wt 85.9 kg   SpO2 98%   BMI 30.57 kg/m  SpO2: SpO2: 98 % O2 Device: O2 Device: Room Air O2 Flow Rate:    Intake/output summary:  Intake/Output Summary (Last 24 hours) at 01/15/2022 1039 Last data filed at 01/15/2022 9622 Gross per 24 hour  Intake  392.39 ml  Output --  Net 392.39 ml   LBM: Last BM Date : 01/14/22 Baseline Weight: Weight: 81.6 kg Most recent weight: Weight: 85.9 kg       Palliative Assessment/Data: PPS 20%      Patient Active Problem List   Diagnosis Date Noted   Septic shock ([place]) 01/13/2022   Major neurocognitive disorder 29/79/8921   Complicated UTI (urinary tract infection) 01/03/2019   Subtherapeutic international normalized ratio (INR) 01/03/2019   Hyponatremia 01/03/2019   Adenoma of right adrenal gland 02/20/2018   Osteopenia 12/18/2017   Glaucoma 10/13/2017   History of TIA (transient ischemic attack) 10/13/2017   History of prediabetes 10/13/2017   Recurrent UTI 11/23/2016   Fibromyalgia 11/23/2016   Pulmonary hypertension 11/23/2016   Left ventricular diastolic dysfunction 19/41/7408   History of pulmonary embolus (PE) 2/2 hypercoagulable state 11/23/2016   History of sepsis 09/17/2016   SIRS (systemic inflammatory response syndrome) 04/29/2016   Hyperlipidemia 04/29/2016   Hypothyroidism 04/29/2016   Hematuria 04/05/2016   Benign essential hypertension    Gait disturbance 12/17/2015   Trochanteric bursitis of both hips 09/13/2015   Chronic low back pain 06/07/2015   Atypical parkinsonism state 05/17/2015   S/P right TKA 05/22/2011    Palliative Care Assessment & Plan   Patient Profile: 76 y.o. female  with past medical history of Parkinson's, recurrent UTI, VTE on coumadin, progressive dementia, and FTT presented to ED on 01/13/2022 from [facility] assisted living facility with AMS.  Patient was admitted on 01/13/2022 with septic shock likely secondary to UTI, septic encephalopathy, acute blood loss anemia from breast hematoma, AKI, progressive failure to thrive, history of ESBL infections.   Assessment: Principal Problem:   Septic shock ([place])   Terminal care  Recommendations/Plan: Continue full comfort measures Continue DNR/DNI as previously documented [facility] has approved patient for home hospice services at her facility  Continue current comfort focused medication regimen -no changes today PMT will continue to follow and support holistically  Symptom Management Continue antibiotics until patient discharges  Morphine PRN pain/dyspnea/increased work of breathing/RR>25 Tylenol PRN pain/fever Biotin twice daily Benadryl PRN itching Robinul PRN secretions Haldol PRN agitation/delirium Ativan PRN anxiety/seizure/sleep/distress Zofran PRN nausea/vomiting Liquifilm Tears PRN  dry eye  Goals of Care and Additional Recommendations: Limitations on Scope of Treatment: Full Comfort Care  Code Status:    Code Status Orders  (From admission, onward)           Start     Ordered   01/14/22 1411  Do not attempt resuscitation (DNR)  Continuous       Question Answer Comment  In the event of cardiac or respiratory ARREST Do not call a "code blue"   In the event of cardiac or respiratory ARREST Do not perform Intubation, CPR, defibrillation or ACLS   In the event of cardiac or respiratory ARREST Use medication by any route, position, wound care, and other measures to relive pain and suffering. May use oxygen, suction and manual treatment of airway obstruction as needed for comfort.      01/14/22 1411           Code Status History     Date Active Date Inactive Code Status Order ID Comments User Context   01/13/2022 1659 01/14/2022 1411 DNR [id number]  Cristal Generous, NP ED   06/14/2020 1151 01/13/2022 1258 DNR [id number]  Ludwig Clarks, DO Outpatient   01/03/2019 2130 01/08/2019 0018 DNR [id number]  Vianne Bulls, MD ED   10/13/2017 0657 10/17/2017 1619 DNR [id number]  Rondel Jumbo, PA-C Inpatient   02/12/2017 0135 02/15/2017 1958 DNR [id number]  Norval Morton, MD Inpatient   11/23/2016 1537 11/28/2016 2237 DNR [id number]  Samella Parr, NP ED   11/23/2016 1028 11/23/2016 1537 DNR [id number]  Samella Parr, NP ED   09/17/2016 2121  09/23/2016 1956 DNR [id number]  Ivor Costa, MD ED   09/17/2016 2118 09/17/2016 2121 Full Code [id number]  Ivor Costa, MD ED   04/29/2016 0615 05/03/2016 1657 DNR [id number]  Reubin Milan, MD Inpatient   12/20/2015 1848 01/01/2016 1400 DNR [id number]  Cathlyn Parsons, PA-C Inpatient   12/17/2015 1616 12/20/2015 1822 DNR [id number]  Samella Parr, NP Inpatient   05/22/2011 2118 05/24/2011 1819 Full Code [id number]  Margot Chimes Dizon, RN Inpatient      Advance Directive Documentation    Flowsheet Row Most Recent Value  Type of Advance Directive Healthcare Power of Attorney  Pre-existing out of facility DNR order (yellow form or pink MOST form) --  "MOST" Form in Place? --       Prognosis:  Poor in the setting of advanced age, dementia, recurrent hospitalizations, malnutrition/FTT, and multiple comorbidities  Discharge Planning: [place] with [place] was discussed with primary RN, patient's family, charge nurse  Thank you for allowing the Palliative Medicine Team to assist in the care of this patient.  Lin Landsman, NP  Please contact Palliative Medicine Team phone at [phone] for questions and concerns.   *Portions of this note are a verbal dictation therefore any spelling and/or grammatical errors are due to the "[place] [employer]" system interpretation.

## 2022-01-15 NOTE — TOC Progression Note (Signed)
Transition of Care [facility]) - Progression Note    Patient Details  Name: [name] [name] MRN: [id number] Date of Birth: June 16, 1945  Transition of Care [facility] CM/SW Contact  Bartholomew Crews, RN Phone Number: [phone] 01/15/2022, 12:14 PM  Clinical Narrative:     Received call from Benjamine Mola at [facility] to advise that patient is not eligible for residential hospice at this time, [facility] will admit patient for hospice services at her facility. [facility] to get orders to admit to hospice from facility tomorrow. Benjamine Mola has discussed with patient's daughter. TOC following for transition needs.   Expected Discharge Plan: [place] Barriers to Discharge: Continued Medical Work up  Expected Discharge Plan and Services Expected Discharge Plan: [place] In-house Referral: Clinical Social Work, Hospice / Palliative Care Discharge Planning Services: CM Consult Post Acute Care Choice: Hospice Living arrangements for the past 2 months: [facility])                 DME Arranged: N/A DME Agency: NA       HH Arranged: NA HH Agency: NA         Social Determinants of Health (SDOH) Interventions Housing Interventions: Intervention Not Indicated  Readmission Risk Interventions     No data to display

## 2022-01-16 DIAGNOSIS — E66811 Obesity, class 1: Secondary | ICD-10-CM | POA: Diagnosis present

## 2022-01-16 DIAGNOSIS — Z86711 Personal history of pulmonary embolism: Secondary | ICD-10-CM | POA: Diagnosis not present

## 2022-01-16 DIAGNOSIS — E782 Mixed hyperlipidemia: Secondary | ICD-10-CM

## 2022-01-16 DIAGNOSIS — G9341 Metabolic encephalopathy: Secondary | ICD-10-CM

## 2022-01-16 DIAGNOSIS — S20211A Contusion of right front wall of thorax, initial encounter: Secondary | ICD-10-CM

## 2022-01-16 DIAGNOSIS — E669 Obesity, unspecified: Secondary | ICD-10-CM | POA: Diagnosis present

## 2022-01-16 DIAGNOSIS — S20219A Contusion of unspecified front wall of thorax, initial encounter: Secondary | ICD-10-CM | POA: Diagnosis present

## 2022-01-16 DIAGNOSIS — A419 Sepsis, unspecified organism: Secondary | ICD-10-CM | POA: Diagnosis not present

## 2022-01-16 DIAGNOSIS — Z8673 Personal history of transient ischemic attack (TIA), and cerebral infarction without residual deficits: Secondary | ICD-10-CM

## 2022-01-16 DIAGNOSIS — E870 Hyperosmolality and hypernatremia: Secondary | ICD-10-CM | POA: Diagnosis not present

## 2022-01-16 LAB — CULTURE, BLOOD (ROUTINE X 2): Special Requests: ADEQUATE

## 2022-01-16 NOTE — Assessment & Plan Note (Signed)
Resume levothyroxine at the time of her discharge.

## 2022-01-16 NOTE — Assessment & Plan Note (Signed)
Patient has been transitioned to comfort care.

## 2022-01-16 NOTE — Assessment & Plan Note (Addendum)
Urine infection, related sepsis, present on admission, complicated with shock.  Patient has been transitioned from meropenem to ceftriaxone.   Discontinue antibiotic therapy.  Hemodynamics have improved and patient is off vasopressors.  IF patient continue stable for the next 24 hrs possible transfer to SNF with hospice services.

## 2022-01-16 NOTE — Hospital Course (Addendum)
Mrs. Nordell was admitted to the hospital with the working diagnosis of septic shock due to urinary tract infection.   76 yo female with the past medical history of recurrent urinary tract infection, VTE, and progressive dementia who presented with altered mental status. Patient was transferred to the ED from her nursing facility due to altered mentation. Unable to get further history due to her acute condition and cognitive impairment. On her initial physical examination her blood pressure was hypotensive but her MAP still at 65, HR 97, Temp 103.6 and RR28 with 02 saturation 96%, ill looking appearing, decreased breath sounds, heart with S1 and S2 present and tachycardic, abdomen soft and non tender to palpation, no lower extremity edema.   Na 152, K 4,2 Cl 114 bicarbonate 23, glucose 214, bun 27 cr 1,24  Lactic acis 5,5 and 4,7  Wbc 28,8  hgb 9,8 plt 552  INR 6.4  Urine analysis with >50 rbc, > 50 wbc  Blood culture positive for Klebsiella pneumonia.   Head Ct with no acute changes Chest radiograph with right rotation with no infiltrates.  Renal Ct with large hematoma in the anterior and later aspect of right chest wall. Small right pleural effusion.   EKG 143 bpm, normal axis, normal intervals, sinus rhythm with no significant ST segment or T wave changes.   Patient was placed on IV antibiotic therapy and required vasopressors (peripheral) due to septic shock.  Patient regain hemodynamic stability Poor prognosis due to dementia and parkinson's disease, she was transitioned to comfort care.  Transferred to [facility] on 11/12.

## 2022-01-16 NOTE — Assessment & Plan Note (Signed)
Calculated BMI is 30,5

## 2022-01-16 NOTE — Progress Notes (Signed)
Progress Note   Patient: [name] [name] GQQ:[id number] DOB: 12-27-1945 DOA: 01/13/2022     3 DOS: the patient was seen and examined on 01/16/2022   Brief hospital course: Mrs. Arroyo was admitted to the hospital with the working diagnosis of septic shock due to urinary tract infection.   76 yo female with the past medical history of recurrent urinary tract infection, VTE, and progressive dementia who presented with altered mental status. Patient was transferred to the ED from her nursing facility due to altered mentation. Unable to get further history due to her acute condition and cognitive impairment. On her initial physical examination her blood pressure was hypotensive but her MAP still at 65, HR 97, Temp 103.6 and RR28 with 02 saturation 96%, ill looking appearing, decreased breath sounds, heart with S1 and S2 present and tachycardic, abdomen soft and non tender to palpation, no lower extremity edema.   Na 152, K 4,2 Cl 114 bicarbonate 23, glucose 214, bun 27 cr 1,24  Lactic acis 5,5 and 4,7  Wbc 28,8  hgb 9,8 plt 552  INR 6.4  Urine analysis with >50 rbc, > 50 wbc  Blood culture positive for Klebsiella pneumonia.   Head Ct with no acute changes Chest radiograph with right rotation with no infiltrates.  Renal Ct with large hematoma in the anterior and later aspect of right chest wall. Small right pleural effusion.   EKG 143 bpm, normal axis, normal intervals, sinus rhythm with no significant ST segment or T wave changes.   Patient was placed on IV antibiotic therapy and required vasopressors (peripheral) due to septic shock.  Patient regain hemodynamic stability Poor prognosis due to dementia and parkinson's disease, she was transitioned to comfort care.  Transferred to [facility] on 11/12.   Assessment and Plan: * Septic shock ([place]) Urine infection, related sepsis, present on admission, complicated with shock.  Patient has been transitioned from meropenem to ceftriaxone.    Discontinue antibiotic therapy.  Hemodynamics have improved and patient is off vasopressors.  IF patient continue stable for the next 24 hrs possible transfer to SNF with hospice services.   Acute metabolic encephalopathy Patient with advance dementia. Positive parkinson's disease. Plan to continue comfort care with as needed haloperidol and lorazepam.   Hypernatremia Hypokalemia.   Renal function with serum cr at 0,92, K 3,4 and serum Na 149  Patient has been transitioned to comfort care. She is off IV fluids.    History of pulmonary embolus (PE) 2/2 hypercoagulable state Currently anticoagulation on hold due to acute blood loss anemia and abdominal hematoma. Continue comfort care.   History of TIA (transient ischemic attack) Patient has been transitioned to comfort care.   Hyperlipidemia No statin therapy.   Hypothyroidism Off hormonal replacement, patient under comfort care.   Class 1 obesity Calculated BMI is 30,5   Chest wall hematoma Large chest wall hematoma with acute blood loss anemia Vitamin K antagonist coagulopathy.  Patient had vitamin K on admission  No clinical signs of worsening anemia. Continue pain control and comfort care.          Subjective: Patient with no chest pain, no apparent dyspnea, limited history due to patient's cognitive impairment   Physical Exam: Vitals:   01/14/22 1938 01/14/22 2338 01/15/22 0635 01/16/22 0449  BP: (!) 106/46 (!) 112/51 (!) 109/53 (!) 117/48  Pulse: 72 66 60 66  Resp: '20 16 17 14  '$ Temp: 97.6 F (36.4 C) (!) 97.4 F (36.3 C) (!) 97.4 F (36.3 C) 97.6 F (  36.4 C)  TempSrc: Axillary Oral Axillary Oral  SpO2: 91% 99% 98% 99%  Weight:      Height:       Neurology somnolent but easy to arouse, answers simple questions ENT with pallor Cardiovascular with S1 and S2 present and rhythmic Respiratory with no rales or wheezing Abdomen with no distention  Positive non pitting lower extremity edema  Data  Reviewed:    Family Communication: I spoke with patient's son at the bedside, we talked in detail about patient's condition, plan of care and prognosis and all questions were addressed.   Disposition: Status is: Inpatient Remains inpatient appropriate because: possible dc to SNF in am with hospice services   Planned Discharge Destination: Skilled nursing facility     Author: Tawni Millers, MD 01/16/2022 12:22 PM  For on call review [email or web address].

## 2022-01-16 NOTE — Assessment & Plan Note (Signed)
Currently anticoagulation on hold due to acute blood loss anemia and abdominal hematoma. Continue comfort care.

## 2022-01-16 NOTE — Assessment & Plan Note (Addendum)
Hypokalemia.   At the time of her discharge her renal function with serum cr at 0,92, K 3,4 and serum Na 149  Patient has been transitioned to comfort care.

## 2022-01-16 NOTE — TOC Initial Note (Signed)
Transition of Care [facility]) - Initial/Assessment Note    Patient Details  Name: [name] [name] MRN: [id number] Date of Birth: [birth year]  Transition of Care [facility]) CM/SW Contact:    Tresa Endo Phone Number: 01/16/2022, 1:19 PM  Clinical Narrative:                 Pt is from [facility] [place] spoke with Estill Bamberg at [place] she states that pt can return with hospice and she has the information she needs. Pt ALF is ready to receive her, pt daughter has been updated.  Pt will need PTAR to transport back to [place].   Expected Discharge Plan: [place] Barriers to Discharge: Continued Medical Work up   Patient Goals and CMS Choice Patient states their goals for this hospitalization and ongoing recovery are:: residential hospice care CMS Medicare.gov Compare Post Acute Care list provided to:: Patient Represenative (must comment) (daughter, [name]) Choice offered to / list presented to : Adult Children  Expected Discharge Plan and Services Expected Discharge Plan: [place] In-house Referral: Clinical Social Work, Hospice / Palliative Care Discharge Planning Services: CM Consult Post Acute Care Choice: Hospice Living arrangements for the past 2 months: [facility])                 DME Arranged: N/A DME Agency: NA       HH Arranged: NA HH Agency: NA        Prior Living Arrangements/Services Living arrangements for the past 2 months: [place] [employer], broadcasting/film/video) Lives with:: Facility Resident Patient language and need for interpreter reviewed:: Yes Do you feel safe going back to the place where you live?: Yes            Criminal Activity/Legal Involvement Pertinent to Current Situation/Hospitalization: No - Comment as needed  Activities of Daily Living Home Assistive Devices/Equipment: Wheelchair ADL Screening (condition at time of admission) Patient's cognitive ability adequate to safely  complete daily activities?: No Is the patient deaf or have difficulty hearing?: No Does the patient have difficulty seeing, even when wearing glasses/contacts?: Yes Does the patient have difficulty concentrating, remembering, or making decisions?: Yes Patient able to express need for assistance with ADLs?: No Does the patient have difficulty dressing or bathing?: Yes Independently performs ADLs?: No Communication: Needs assistance Is this a change from baseline?: Pre-admission baseline Dressing (OT): Dependent Is this a change from baseline?: Pre-admission baseline Does the patient have difficulty walking or climbing stairs?: No Weakness of Legs: Both Weakness of Arms/Hands: Both  Permission Sought/Granted                  Emotional Assessment         Alcohol / Substance Use: Not Applicable Psych Involvement: No (comment)  Admission diagnosis:  Acute cystitis without hematuria [N30.00] Supratherapeutic INR [R79.1] Septic shock ([place]) [A41.9, R65.21] Sepsis, due to unspecified organism, unspecified whether acute organ dysfunction present [facility]) [A41.9] Patient Active Problem List   Diagnosis Date Noted   Hypernatremia 01/16/2022   Class 1 obesity 01/16/2022   Chest wall hematoma 01/16/2022   Septic shock ([place]) 01/13/2022   Major neurocognitive disorder 50/11/3816   Complicated UTI (urinary tract infection) 01/03/2019   Subtherapeutic international normalized ratio (INR) 01/03/2019   Hyponatremia 01/03/2019   Adenoma of right adrenal gland 02/20/2018   Osteopenia 12/18/2017   Glaucoma 10/13/2017   History of TIA (transient ischemic attack) 10/13/2017   History of prediabetes 10/13/2017   Recurrent UTI 11/23/2016  Fibromyalgia 11/23/2016   Pulmonary hypertension 11/23/2016   Left ventricular diastolic dysfunction 01/19/5207   History of pulmonary embolus (PE) 2/2 hypercoagulable state 11/23/2016   History of sepsis 04/28/3610   Acute metabolic encephalopathy  24/49/7530   SIRS (systemic inflammatory response syndrome) 04/29/2016   Hyperlipidemia 04/29/2016   Hypothyroidism 04/29/2016   Hematuria 04/05/2016   Benign essential hypertension    Gait disturbance 12/17/2015   Trochanteric bursitis of both hips 09/13/2015   Chronic low back pain 06/07/2015   Atypical parkinsonism state 05/17/2015   S/P right TKA 05/22/2011   PCP:  Darreld Mclean, MD Pharmacy:   [place], [place] [address] Phone: [phone] Fax: [place], [place] [address] [place] [address] Phone: [phone] Fax: [place], [place] [address] [place] [address] Phone: [phone] Fax: [phone]  [place], [place] [address] Phone: [phone] Fax: [phone]     Social Determinants of Health (SDOH) Interventions Housing Interventions: Intervention Not Indicated  Readmission Risk Interventions     No data to display

## 2022-01-16 NOTE — Assessment & Plan Note (Signed)
No statin therapy.

## 2022-01-16 NOTE — Assessment & Plan Note (Signed)
Patient with advance dementia. Positive parkinson's disease. Plan to continue comfort care with as needed haloperidol and lorazepam.

## 2022-01-16 NOTE — Assessment & Plan Note (Addendum)
Large chest wall hematoma with acute blood loss anemia Vitamin K antagonist coagulopathy. INR reverted to 1,9 with vitamin K.  No further anticoagulation due to bleeding risk.

## 2022-01-17 DIAGNOSIS — E669 Obesity, unspecified: Secondary | ICD-10-CM

## 2022-01-17 DIAGNOSIS — N390 Urinary tract infection, site not specified: Secondary | ICD-10-CM

## 2022-01-17 DIAGNOSIS — A419 Sepsis, unspecified organism: Secondary | ICD-10-CM | POA: Diagnosis not present

## 2022-01-17 DIAGNOSIS — R791 Abnormal coagulation profile: Secondary | ICD-10-CM | POA: Diagnosis not present

## 2022-01-17 DIAGNOSIS — Z86711 Personal history of pulmonary embolism: Secondary | ICD-10-CM | POA: Diagnosis not present

## 2022-01-17 DIAGNOSIS — N3000 Acute cystitis without hematuria: Secondary | ICD-10-CM | POA: Diagnosis not present

## 2022-01-17 DIAGNOSIS — E870 Hyperosmolality and hypernatremia: Secondary | ICD-10-CM | POA: Diagnosis not present

## 2022-01-17 DIAGNOSIS — G9341 Metabolic encephalopathy: Secondary | ICD-10-CM | POA: Diagnosis not present

## 2022-01-17 DIAGNOSIS — Z515 Encounter for palliative care: Secondary | ICD-10-CM | POA: Diagnosis not present

## 2022-01-17 MED ORDER — MORPHINE SULFATE 10 MG/5ML PO SOLN
5.0000 mg | ORAL | Status: DC | PRN
  Administered 2022-01-17: 5 mg via ORAL
  Filled 2022-01-17: qty 5

## 2022-01-17 MED ORDER — HYDROCODONE-ACETAMINOPHEN 5-325 MG PO TABS: 1.0000 | ORAL_TABLET | Freq: Three times a day (TID) | ORAL | 0 refills | Status: AC | PRN

## 2022-01-17 NOTE — Plan of Care (Signed)
  Problem: Clinical Measurements: Goal: Ability to maintain clinical measurements within normal limits will improve Outcome: Progressing Goal: Will remain free from infection Outcome: Progressing Goal: Diagnostic test results will improve Outcome: Progressing Goal: Respiratory complications will improve Outcome: Progressing Goal: Cardiovascular complication will be avoided Outcome: Progressing   Problem: Nutrition: Goal: Adequate nutrition will be maintained Outcome: Progressing   Problem: Coping: Goal: Level of anxiety will decrease Outcome: Progressing   Problem: Elimination: Goal: Will not experience complications related to bowel motility Outcome: Progressing   Problem: Pain Managment: Goal: General experience of comfort will improve Outcome: Progressing   Problem: Safety: Goal: Ability to remain free from injury will improve Outcome: Progressing   Problem: Skin Integrity: Goal: Risk for impaired skin integrity will decrease Outcome: Progressing

## 2022-01-17 NOTE — Discharge Summary (Addendum)
Physician Discharge Summary   Patient: [name] [name] MRN: [id number] DOB: 1945/08/13  Admit date:     01/13/2022  Discharge date: 01/17/22  Discharge Physician: Tawni Millers   PCP: [name] Mclean, MD   Recommendations at discharge:    Patient will return to assisted living facility under hospice services Continue with comfort care No further anticoagulation   I spoke with patient's daughter at the bedside, we talked in detail about patient's condition, plan of care and prognosis and all questions were addressed.   Discharge Diagnoses: Principal Problem:   Sepsis secondary to UTI [facility]) Active Problems:   Acute metabolic encephalopathy   Hypernatremia   History of pulmonary embolus (PE) 2/2 hypercoagulable state   History of TIA (transient ischemic attack)   Hyperlipidemia   Hypothyroidism   Chest wall hematoma   Class 1 obesity  Resolved Problems:   * No resolved hospital problems. [facility] Course: [name] [name] was admitted to the hospital with the working diagnosis of septic shock due to urinary tract infection.   76 yo female with the past medical history of recurrent urinary tract infection, VTE, and progressive dementia who presented with altered mental status. Patient was transferred to the ED from her assisted living facility due to altered mentation. Unable to get further history due to her acute condition and cognitive impairment. On her initial physical examination she was hypotensive but her MAP still at 65, HR 97, Temp 103.6 and RR28 with 02 saturation 96%, ill looking appearing, decreased breath sounds, heart with S1 and S2 present and tachycardic, abdomen soft and non tender to palpation, no lower extremity edema.   Na 152, K 4,2 Cl 114 bicarbonate 23, glucose 214, bun 27 cr 1,24  Lactic acis 5,5 and 4,7  Wbc 28,8  hgb 9,8 plt 552  INR 6.4  Urine analysis with >50 rbc, > 50 wbc  Blood culture positive for Klebsiella pneumonia.   Head Ct with  no acute changes Chest radiograph with right rotation with no infiltrates.  Renal Ct with large hematoma in the anterior and later aspect of right chest wall. Small right pleural effusion.   EKG 143 bpm, normal axis, normal intervals, sinus rhythm with no significant ST segment or T wave changes.   Patient was placed on IV antibiotic therapy and required vasopressors (peripheral) due to septic shock.  Patient regain hemodynamic stability Poor prognosis due to dementia and parkinson's disease, she was transitioned to comfort care.  Transferred to [facility] on 11/12.   11/14 patient continue hemodynamically stable, plan to discharge to assisted living facility with hospice services.   Assessment and Plan: * Sepsis secondary to UTI [facility]) Urine infection, related sepsis, present on admission, complicated with septic shock.  Blood culture positive for Klebsiella Pneumonia, sensitive to cephalosporins.  Patient has been transitioned from meropenem to ceftriaxone.   Patient regained hemodynamic stability but continue with very poor prognosis. Family decided to transition to comfort care, antibiotic therapy has been discontinued and she will follow up with hospice services.   Acute metabolic encephalopathy Patient with advance dementia. Positive parkinson's disease. Plan to continue comfort care with as needed haloperidol and lorazepam during her hospitalization. At the time of her discharge she will resume Sinemet, donepezil, quetiapine and sertraline.    Hypernatremia Hypokalemia.   At the time of her discharge her renal function with serum cr at 0,92, K 3,4 and serum Na 149  Patient has been transitioned to comfort care.   History of pulmonary embolus (  PE) 2/2 hypercoagulable state Chest wall hematoma, anticoagulation has been discontinued. Continue to hold on warfarin, patient with poor prognosis, now transitioned to comfort care.  She received vitamin K with correction of INR to 1,9   Last Hgb 7,5   History of TIA (transient ischemic attack) Patient has been transitioned to comfort care.   Hyperlipidemia No statin therapy.   Hypothyroidism Resume levothyroxine at the time of her discharge.   Chest wall hematoma Large chest wall hematoma with acute blood loss anemia Vitamin K antagonist coagulopathy. INR reverted to 1,9 with vitamin K.  No further anticoagulation due to bleeding risk.    Class 1 obesity Calculated BMI is 30,5          Consultants: Palliative care team  Procedures performed: none  Disposition: Assisted living Diet recommendation:  Regular diet DISCHARGE MEDICATION: Allergies as of 01/17/2022       Reactions   Crestor [rosuvastatin Calcium] Other (See Comments)   Feeling very bad, aching all over.   Erythromycin Hives   Gabapentin Other (See Comments)   Blurred vision   Pregabalin Other (See Comments)   Crossed vision.   Pregabalin Other (See Comments)   Crossed vision. Unknown   Macrobid [nitrofurantoin] Hives   Losartan Itching        Medication List     STOP taking these medications    amLODipine 2.5 MG tablet Commonly known as: NORVASC   aspirin EC 81 MG tablet   atorvastatin 10 MG tablet Commonly known as: LIPITOR   Cranberry 425 MG Caps   lactobacillus acidophilus Tabs tablet   metFORMIN 500 MG tablet Commonly known as: GLUCOPHAGE   warfarin 1 MG tablet Commonly known as: COUMADIN   warfarin 3 MG tablet Commonly known as: COUMADIN       TAKE these medications    acetaminophen 500 MG tablet Commonly known as: TYLENOL Take 500 mg by mouth every 6 (six) hours as needed for mild pain.   carbidopa-levodopa 25-100 MG tablet Commonly known as: SINEMET IR Take 2 tablets at 7am/1 at 11am/1 at 4pm What changed:  how much to take how to take this when to take this additional instructions   donepezil 10 MG tablet Commonly known as: ARICEPT Take 1 tablet (10 mg total) by mouth at bedtime.    HYDROcodone-acetaminophen 5-325 MG tablet Commonly known as: NORCO/VICODIN Take 1 tablet by mouth every 8 (eight) hours as needed for moderate pain.   levothyroxine 125 MCG tablet Commonly known as: SYNTHROID Take 125 mcg by mouth daily before breakfast.   melatonin 3 MG Tabs tablet Take 3 mg by mouth at bedtime.   QUEtiapine 25 MG tablet Commonly known as: SEROQUEL Take 12.5-25 mg by mouth See admin instructions. Take 12.5 mg (1/2 tablet) by mouth in the morning, and 25 mg (1 tablet) at bedtime.   sertraline 100 MG tablet Commonly known as: ZOLOFT Take 100 mg by mouth daily.        Discharge Exam: Filed Weights   01/13/22 1342 01/14/22 0500  Weight: 81.6 kg 85.9 kg   BP (!) 133/57 (BP Location: Left Arm)   Pulse 63   Temp 98.7 F (37.1 C) (Oral)   Resp 15   Ht '5\' 6"'$  (1.676 m)   Wt 85.9 kg   SpO2 99%   BMI 30.57 kg/m   Patient with no dyspnea, positive pain all over  Neurology awake and alert, not in apparent distress, deconditioned ENT with mild pallor Cardiovascular with S1 and S2 present  and rhythmic with no gallops Respiratory with no wheezing Abdomen with no distention  No lower extremity edema   Condition at discharge: stable  The results of significant diagnostics from this hospitalization (including imaging, microbiology, ancillary and laboratory) are listed below for reference.   Imaging Studies: CT Renal Stone Study  Result Date: 01/13/2022 CLINICAL DATA:  Flank pain EXAM: CT ABDOMEN AND PELVIS WITHOUT CONTRAST TECHNIQUE: Multidetector CT imaging of the abdomen and pelvis was performed following the standard protocol without IV contrast. RADIATION DOSE REDUCTION: This exam was performed according to the departmental dose-optimization program which includes automated exposure control, adjustment of the mA and/or kV according to patient size and/or use of iterative reconstruction technique. COMPARISON:  10/13/2017 FINDINGS: Lower chest: There is large  hematoma measuring approximately 13.1 x 6.5 cm in the soft tissues of right anterior and lateral chest wall. Upper extent of this hematoma is not included in the images. There is abnormal enlargement of right pectoral muscles. Coronary artery calcifications are seen. Small hiatal hernia is seen. There is small right pleural effusion. There are linear densities in the posterior lower lung fields suggesting subsegmental atelectasis. Hepatobiliary: Liver is enlarged measuring 21.9 cm in length. Surgical clips are seen in gallbladder fossa. There is no dilation of bile ducts. Pancreas: No focal abnormalities are seen. Spleen: Unremarkable. Adrenals/Urinary Tract: There are nodular densities in right adrenal measuring up to 2.3 cm. There is interval increase in size of the right adrenal nodules. These possible small 8 mm nodule in the left adrenal. There is no hydronephrosis. Right ureteral stent is seen. There is 3 mm calculus in the midportion of left kidney. Ureters are not dilated. There is tiny pocket of air in the urinary bladder, possibly due to recent instrumentation. Lower portion of the urinary bladder is below the level of pubic symphysis suggesting possible cystocele. Stomach/Bowel: Small hiatal hernia is seen. Stomach is not distended. Small bowel loops are not dilated. Appendix is not seen. There is no pericecal inflammation. There is no significant wall thickening in colon. There is no pericolic stranding. Vascular/Lymphatic: Extensive arterial calcifications are seen. No significant lymphadenopathy seen. Reproductive: Uterus is not seen. Other: There is no ascites or pneumoperitoneum. Bilateral inguinal hernias containing fat are seen. Musculoskeletal: Degenerative changes are noted in lumbar spine, more so at L5-S1 level. IMPRESSION: There is large hematoma in the anterior and lateral aspect of right chest wall. This may be due to trauma or underlying neoplastic process. Please correlate with clinical  symptoms and physical examination findings. Small right pleural effusion. Linear densities in right lower lung fields suggest subsegmental atelectasis. There is no hydronephrosis. Right ureteral stent is noted in place. Small calcification in the left kidney may suggest nonobstructing calculus. There is no evidence of intestinal obstruction or pneumoperitoneum. Nodules are seen in both adrenals, more so on the right side with interval increase. This may be due to incidental adenomas or some other neoplastic process. Coronary artery disease. Small hiatal hernia. Status post cholecystectomy. Lumbar spondylosis. Other findings as described in the body of the report. Electronically Signed   By: Elmer Picker M.D.   On: 01/13/2022 15:50   CT Head Wo Contrast  Result Date: 01/13/2022 CLINICAL DATA:  Altered mental status EXAM: CT HEAD WITHOUT CONTRAST TECHNIQUE: Contiguous axial images were obtained from the base of the skull through the vertex without intravenous contrast. RADIATION DOSE REDUCTION: This exam was performed according to the departmental dose-optimization program which includes automated exposure control, adjustment of the mA and/or  kV according to patient size and/or use of iterative reconstruction technique. COMPARISON:  12/08/2021 FINDINGS: Brain: No acute intracranial findings are seen. There are no signs of bleeding within the cranium. Cortical sulci are prominent. Cavum septum vergae and cavum septum pellucidum are noted. There is prominence of third and both lateral ventricles. There is decreased density in periventricular and subcortical white matter. Vascular: Unremarkable. Skull: Unremarkable. Sinuses/Orbits: Unremarkable. Other: None. IMPRESSION: No acute intracranial findings are seen. Atrophy. Small-vessel disease. Electronically Signed   By: Elmer Picker M.D.   On: 01/13/2022 15:28   DG Chest Port 1 View  Result Date: 01/13/2022 CLINICAL DATA:  Increased confusion, was  supposed to start antibiotics for UTI but did not EXAM: PORTABLE CHEST 1 VIEW COMPARISON:  Portable exam 1333 hours compared to 03/13/2019 FINDINGS: Borderline enlargement of cardiac silhouette. Atherosclerotic calcification aorta. Mediastinal contours and pulmonary vascularity normal. Bronchitic changes with bibasilar atelectasis. No infiltrate, pleural effusion, or pneumothorax. Bones demineralized. IMPRESSION: Bibasilar atelectasis. Aortic Atherosclerosis (ICD10-I70.0). Electronically Signed   By: Lavonia Dana M.D.   On: 01/13/2022 13:50    Microbiology: Results for orders placed or performed during the hospital encounter of 01/13/22  Resp Panel by RT-PCR (Flu A&B, Covid) Anterior Nasal Swab     Status: None   Collection Time: 01/13/22  1:25 PM   Specimen: Anterior Nasal Swab  Result Value Ref Range Status   SARS Coronavirus 2 by RT PCR NEGATIVE NEGATIVE Final    Comment: (NOTE) SARS-CoV-2 target nucleic acids are NOT DETECTED.  The SARS-CoV-2 RNA is generally detectable in upper respiratory specimens during the acute phase of infection. The lowest concentration of SARS-CoV-2 viral copies this assay can detect is 138 copies/mL. A negative result does not preclude SARS-Cov-2 infection and should not be used as the sole basis for treatment or other patient management decisions. A negative result may occur with  improper specimen collection/handling, submission of specimen other than nasopharyngeal swab, presence of viral mutation(s) within the areas targeted by this assay, and inadequate number of viral copies(<138 copies/mL). A negative result must be combined with clinical observations, patient history, and epidemiological information. The expected result is Negative.  Fact Sheet for Patients:  EntrepreneurPulse.com.au  Fact Sheet for Healthcare Providers:  IncredibleEmployment.be  This test is no t yet approved or cleared by the [place] FDA  and  has been authorized for detection and/or diagnosis of SARS-CoV-2 by FDA under an Emergency Use Authorization (EUA). This EUA will remain  in effect (meaning this test can be used) for the duration of the COVID-19 declaration under Section 564(b)(1) of the Act, 21 U.S.C.section 360bbb-3(b)(1), unless the authorization is terminated  or revoked sooner.       Influenza A by PCR NEGATIVE NEGATIVE Final   Influenza B by PCR NEGATIVE NEGATIVE Final    Comment: (NOTE) The Xpert Xpress SARS-CoV-2/FLU/RSV plus assay is intended as an aid in the diagnosis of influenza from Nasopharyngeal swab specimens and should not be used as a sole basis for treatment. Nasal washings and aspirates are unacceptable for Xpert Xpress SARS-CoV-2/FLU/RSV testing.  Fact Sheet for Patients: EntrepreneurPulse.com.au  Fact Sheet for Healthcare Providers: IncredibleEmployment.be  This test is not yet approved or cleared by the [place] FDA and has been authorized for detection and/or diagnosis of SARS-CoV-2 by FDA under an Emergency Use Authorization (EUA). This EUA will remain in effect (meaning this test can be used) for the duration of the COVID-19 declaration under Section 564(b)(1) of the Act, 21 U.S.C. section 360bbb-3(b)(1),  unless the authorization is terminated or revoked.  Performed at [facility], [place] [address] [place], [place] [address]   Urine Culture     Status: Abnormal   Collection Time: 01/13/22  1:25 PM   Specimen: In/Out Cath Urine  Result Value Ref Range Status   Specimen Description IN/OUT CATH URINE  Final   Special Requests   Final    NONE Performed at [facility], [place] [address], [place], [place] [address]    Culture MULTIPLE SPECIES PRESENT, SUGGEST RECOLLECTION (A)  Final   Report Status 01/14/2022 FINAL  Final  Blood Culture (routine x 2)     Status: Abnormal   Collection Time: 01/13/22  3:00 PM   Specimen: BLOOD  LEFT FOREARM  Result Value Ref Range Status   Specimen Description BLOOD LEFT FOREARM  Final   Special Requests   Final    BOTTLES DRAWN AEROBIC AND ANAEROBIC Blood Culture adequate volume   Culture  Setup Time   Final    ANAEROBIC BOTTLE ONLY GRAM NEGATIVE RODS Organism ID to follow CRITICAL RESULT CALLED TO, READ BACK BY AND VERIFIED WITH:  C/ PHARMD L. CHEN 01/14/22 2037 A. LAFRANCE Performed at [facility], [place] [address], [place], [place] [address]    Culture KLEBSIELLA PNEUMONIAE (A)  Final   Report Status 01/16/2022 FINAL  Final   Organism ID, Bacteria KLEBSIELLA PNEUMONIAE  Final      Susceptibility   Klebsiella pneumoniae - MIC*    AMPICILLIN RESISTANT Resistant     CEFAZOLIN <=4 SENSITIVE Sensitive     CEFEPIME <=0.12 SENSITIVE Sensitive     CEFTAZIDIME <=1 SENSITIVE Sensitive     CEFTRIAXONE <=0.25 SENSITIVE Sensitive     CIPROFLOXACIN <=0.25 SENSITIVE Sensitive     GENTAMICIN <=1 SENSITIVE Sensitive     IMIPENEM <=0.25 SENSITIVE Sensitive     TRIMETH/SULFA <=20 SENSITIVE Sensitive     AMPICILLIN/SULBACTAM 4 SENSITIVE Sensitive     PIP/TAZO <=4 SENSITIVE Sensitive     * KLEBSIELLA PNEUMONIAE  Blood Culture ID Panel (Reflexed)     Status: Abnormal   Collection Time: 01/13/22  3:00 PM  Result Value Ref Range Status   Enterococcus faecalis NOT DETECTED NOT DETECTED Final   Enterococcus Faecium NOT DETECTED NOT DETECTED Final   Listeria monocytogenes NOT DETECTED NOT DETECTED Final   Staphylococcus species NOT DETECTED NOT DETECTED Final   Staphylococcus aureus (BCID) NOT DETECTED NOT DETECTED Final   Staphylococcus epidermidis NOT DETECTED NOT DETECTED Final   Staphylococcus lugdunensis NOT DETECTED NOT DETECTED Final   Streptococcus species NOT DETECTED NOT DETECTED Final   Streptococcus agalactiae NOT DETECTED NOT DETECTED Final   Streptococcus pneumoniae NOT DETECTED NOT DETECTED Final   Streptococcus pyogenes NOT DETECTED NOT DETECTED Final    A.calcoaceticus-baumannii NOT DETECTED NOT DETECTED Final   Bacteroides fragilis NOT DETECTED NOT DETECTED Final   Enterobacterales DETECTED (A) NOT DETECTED Final    Comment: Enterobacterales represent a large order of gram negative bacteria, not a single organism. CRITICAL RESULT CALLED TO, READ BACK BY AND VERIFIED WITH:  C/ PHARMD L. CHEN 01/14/22 2037 A. LAFRANCE    Enterobacter cloacae complex NOT DETECTED NOT DETECTED Final   Escherichia coli NOT DETECTED NOT DETECTED Final   Klebsiella aerogenes NOT DETECTED NOT DETECTED Final   Klebsiella oxytoca NOT DETECTED NOT DETECTED Final   Klebsiella pneumoniae DETECTED (A) NOT DETECTED Final    Comment: CRITICAL RESULT CALLED TO, READ BACK BY AND VERIFIED WITH:  C/ PHARMD L. CHEN 01/14/22 2037 A. LAFRANCE    Proteus species NOT DETECTED NOT DETECTED Final   Salmonella species NOT DETECTED NOT DETECTED Final   Serratia marcescens NOT DETECTED NOT DETECTED Final   Haemophilus influenzae NOT DETECTED NOT DETECTED Final   Neisseria meningitidis NOT DETECTED NOT DETECTED Final   Pseudomonas aeruginosa NOT DETECTED NOT DETECTED Final   Stenotrophomonas maltophilia NOT DETECTED NOT DETECTED Final   Candida albicans NOT DETECTED NOT DETECTED Final   Candida auris NOT DETECTED NOT DETECTED Final   Candida glabrata NOT DETECTED NOT DETECTED Final   Candida krusei NOT DETECTED NOT DETECTED Final   Candida parapsilosis NOT DETECTED NOT DETECTED Final   Candida tropicalis NOT DETECTED NOT DETECTED Final   Cryptococcus neoformans/gattii NOT DETECTED NOT DETECTED Final   CTX-M ESBL NOT DETECTED NOT DETECTED Final   Carbapenem resistance IMP NOT DETECTED NOT DETECTED Final   Carbapenem resistance KPC NOT DETECTED NOT DETECTED Final   Carbapenem resistance NDM NOT DETECTED NOT DETECTED Final   Carbapenem resist OXA 48 LIKE NOT DETECTED NOT DETECTED Final   Carbapenem resistance VIM NOT DETECTED NOT DETECTED Final    Comment: Performed at [facility], [address], [place], [place] [address]  Respiratory (~20 pathogens) panel by PCR     Status: None   Collection Time: 01/13/22  5:05 PM   Specimen: Anterior Nasal Swab; Respiratory  Result Value Ref Range Status   Adenovirus NOT DETECTED NOT DETECTED Final   Coronavirus 229E NOT DETECTED NOT DETECTED Final    Comment: (NOTE) The Coronavirus on the Respiratory Panel, DOES NOT test for the novel  Coronavirus (2019 nCoV)    Coronavirus HKU1 NOT DETECTED NOT DETECTED Final   Coronavirus NL63 NOT DETECTED NOT DETECTED Final   Coronavirus OC43 NOT DETECTED NOT DETECTED Final   Metapneumovirus NOT DETECTED NOT DETECTED Final   Rhinovirus / Enterovirus NOT DETECTED NOT DETECTED Final   Influenza A NOT DETECTED NOT DETECTED Final   Influenza B NOT DETECTED NOT DETECTED Final   Parainfluenza Virus 1 NOT DETECTED NOT DETECTED Final   Parainfluenza Virus 2 NOT DETECTED NOT DETECTED Final   Parainfluenza Virus 3 NOT DETECTED NOT DETECTED Final   Parainfluenza Virus 4 NOT DETECTED NOT DETECTED Final   Respiratory Syncytial Virus NOT DETECTED NOT DETECTED Final   Bordetella pertussis NOT DETECTED NOT DETECTED Final   Bordetella Parapertussis NOT DETECTED NOT DETECTED Final   Chlamydophila pneumoniae NOT DETECTED NOT DETECTED Final   Mycoplasma pneumoniae NOT DETECTED NOT DETECTED Final    Comment: Performed at [facility], [place]. [address], [place], [place] [address]  MRSA Next Gen by PCR, Nasal     Status: Abnormal   Collection Time: 01/13/22  7:02 PM   Specimen: Nasal Mucosa; Nasal Swab  Result Value Ref Range Status   MRSA by PCR Next Gen DETECTED (A) NOT DETECTED Final    Comment: RESULT CALLED TO, READ BACK BY AND VERIFIED WITH: L [id number],RN'@[id number]'$  01/13/22 [place] (NOTE) The GeneXpert MRSA Assay (FDA approved for NASAL specimens only), is one component of a comprehensive MRSA colonization surveillance program. It is not intended to diagnose MRSA infection nor to guide or  monitor treatment for MRSA infections. Test performance is not FDA approved in patients less than 73 years old. Performed at [facility], [place] [address], [place], [place] [address]   Blood Culture (routine x 2)     Status: None (Preliminary result)   Collection Time:  01/13/22  7:48 PM   Specimen: BLOOD  Result Value Ref Range Status   Specimen Description BLOOD SITE NOT SPECIFIED  Final   Special Requests   Final    BOTTLES DRAWN AEROBIC AND ANAEROBIC Blood Culture results may not be optimal due to an excessive volume of blood received in culture bottles   Culture   Final    NO GROWTH 4 DAYS Performed at [facility], [place] [address] [place], [place] [address]    Report Status PENDING  Incomplete    Labs: CBC: Recent Labs  Lab 01/13/22 1305 01/13/22 1941 01/14/22 0204 01/14/22 1259  WBC 28.8* 22.8* 26.0* 18.2*  NEUTROABS 25.2*  --   --   --   HGB 9.8* 7.7* 7.6* 7.5*  HCT 31.8* 25.3* 25.0* 25.2*  MCV 87.8 87.2 89.9 89.7  PLT 522* 420* 441* 376   Basic Metabolic Panel: Recent Labs  Lab 01/13/22 1305 01/14/22 0204  NA 152* 149*  K 4.2 3.4*  CL 114* 116*  CO2 23 24  GLUCOSE 214* 188*  BUN 27* 24*  CREATININE 1.24* 0.92  CALCIUM 8.7* 7.8*   Liver Function Tests: Recent Labs  Lab 01/13/22 1305  AST 21  ALT 6  ALKPHOS 119  BILITOT 0.7  PROT 6.5  ALBUMIN 2.0*   CBG: Recent Labs  Lab 01/13/22 2324 01/14/22 0339 01/14/22 0759 01/14/22 1155 01/14/22 1547  GLUCAP 146* 173* 135* 140* 183*    Discharge time spent: greater than 30 minutes.  Signed: Tawni Millers, MD [facility] 01/17/2022

## 2022-01-17 NOTE — Care Management Important Message (Signed)
Important Message  Patient Details  Name: [name] [name] MRN: [id number] Date of Birth: 05/03/1945   Medicare Important Message Given:  Yes     [name] 01/17/2022, 2:47 PM

## 2022-01-17 NOTE — Progress Notes (Signed)
Daily Progress Note   Patient Name: [name] [name]       Date: 01/17/2022 DOB: [birth year]  Age: 76 y.o. MRN#: [id number] Attending Physician: Tawni Millers Primary Care Physician: Darreld Mclean, MD Admit Date: 01/13/2022  Reason for Consultation/Follow-up: Non pain symptom management, Pain control, Psychosocial/spiritual support, and Terminal Care  Subjective: Chart review performed. Received report from primary RN - no acute concerns.  Went to visit patient at bedside - daughter/Monica present. Patient was lying in bed awake, alert, able to participate in simple conversation. No signs or non-verbal gestures of pain or discomfort noted. No respiratory distress, increased work of breathing, or secretions noted. Patient endorses pain in her sacrum. Requested NT assistance with turning patient to her side for sacral pressure relief.   Emotional support provided to [facility] - she requests we speak in the hallway. [name] tells me that patient is "doing much better" and she questions if comfort/hospice is still the right decision. Reviewed patient's progressive FTT, high risk for rehospitalization, and possible improvement due to goal switch to help her feel as best she can. Prognostication reviewed - hospice would still be a reasonable support for patient at discharge - [name] agrees.   Discussed and answered questions about medication usage at EOL - discussed patient's home meds, specifically Seroquel and Zoloft. Reviewed that while patient is still able to tolerate PO medications, it would be reasonable to continue these. Patient has not been receiving in house - they were not ordered on admission. Encouraged Monica to review home medications with hospice staff to ensure all recommended  medications are focused on comfort. Plan is for patient discharge today with hospice services.  All questions and concerns addressed. Encouraged to call with questions and/or concerns. PMT card previously provided.  Length of Stay: 4  Current Medications: Scheduled Meds:   antiseptic oral rinse  15 mL Topical BID    Continuous Infusions:   PRN Meds: acetaminophen **OR** acetaminophen, diphenhydrAMINE, glycopyrrolate **OR** glycopyrrolate **OR** glycopyrrolate, haloperidol **OR** haloperidol **OR** haloperidol lactate, LORazepam **OR** LORazepam **OR** LORazepam, morphine injection, ondansetron **OR** ondansetron (ZOFRAN) IV, polyethylene glycol, polyvinyl alcohol, white petrolatum  Physical Exam Vitals and nursing note reviewed.  Constitutional:      General: She is not in acute distress. Pulmonary:     Effort: No respiratory distress.  Skin:  General: Skin is warm and dry.  Neurological:     Mental Status: She is alert.     Motor: Weakness present.  Psychiatric:        Attention and Perception: Attention normal.        Behavior: Behavior is cooperative.             Vital Signs: BP (!) 133/57 (BP Location: Left Arm)   Pulse 63   Temp 98.7 F (37.1 C) (Oral)   Resp 15   Ht '5\' 6"'$  (1.676 m)   Wt 85.9 kg   SpO2 99%   BMI 30.57 kg/m  SpO2: SpO2: 99 % O2 Device: O2 Device: Room Air O2 Flow Rate:    Intake/output summary:  Intake/Output Summary (Last 24 hours) at 01/17/2022 1052 Last data filed at 01/17/2022 0500 Gross per 24 hour  Intake --  Output 250 ml  Net -250 ml   LBM: Last BM Date : 01/14/22 Baseline Weight: Weight: 81.6 kg Most recent weight: Weight: 85.9 kg       Palliative Assessment/Data: PPS 30%      Patient Active Problem List   Diagnosis Date Noted   Hypernatremia 01/16/2022   Class 1 obesity 01/16/2022   Chest wall hematoma 01/16/2022   Septic shock ([place]) 01/13/2022   Major neurocognitive disorder 99/24/2683   Complicated UTI  (urinary tract infection) 01/03/2019   Subtherapeutic international normalized ratio (INR) 01/03/2019   Hyponatremia 01/03/2019   Adenoma of right adrenal gland 02/20/2018   Osteopenia 12/18/2017   Glaucoma 10/13/2017   History of TIA (transient ischemic attack) 10/13/2017   History of prediabetes 10/13/2017   Recurrent UTI 11/23/2016   Fibromyalgia 11/23/2016   Pulmonary hypertension 11/23/2016   Left ventricular diastolic dysfunction 41/96/2229   History of pulmonary embolus (PE) 2/2 hypercoagulable state 11/23/2016   History of sepsis 79/89/2119   Acute metabolic encephalopathy 41/74/0814   SIRS (systemic inflammatory response syndrome) 04/29/2016   Hyperlipidemia 04/29/2016   Hypothyroidism 04/29/2016   Hematuria 04/05/2016   Benign essential hypertension    Gait disturbance 12/17/2015   Trochanteric bursitis of both hips 09/13/2015   Chronic low back pain 06/07/2015   Atypical parkinsonism state 05/17/2015   S/P right TKA 05/22/2011    Palliative Care Assessment & Plan   Patient Profile: 76 y.o. female  with past medical history of Parkinson's, recurrent UTI, VTE on coumadin, progressive dementia, and FTT presented to ED on 01/13/2022 from [facility] assisted living facility with AMS.  Patient was admitted on 01/13/2022 with septic shock likely secondary to UTI, septic encephalopathy, acute blood loss anemia from breast hematoma, AKI, progressive failure to thrive, history of ESBL infections.    Assessment: Principal Problem:   Septic shock (HCC) Active Problems:   Hyperlipidemia   Hypothyroidism   Acute metabolic encephalopathy   History of pulmonary embolus (PE) 2/2 hypercoagulable state   History of TIA (transient ischemic attack)   Hypernatremia   Class 1 obesity   Chest wall hematoma   Terminal care  Recommendations/Plan: Continue full comfort measures Continue DNR/DNI as previously documented Discharge back to [facility] today with home hospice services  through [place] current comfort focused medication regimen - no changes today PMT will continue to follow and support holistically   Symptom Management Morphine PRN pain/dyspnea/increased work of breathing/RR>25 Tylenol PRN pain/fever Biotin twice daily Benadryl PRN itching Robinul PRN secretions Haldol PRN agitation/delirium Ativan PRN anxiety/seizure/sleep/distress Zofran PRN nausea/vomiting Liquifilm Tears PRN dry eye  Goals of Care and Additional  Recommendations: Limitations on Scope of Treatment: Full Comfort Care  Code Status:    Code Status Orders  (From admission, onward)           Start     Ordered   01/14/22 1411  Do not attempt resuscitation (DNR)  Continuous       Question Answer Comment  In the event of cardiac or respiratory ARREST Do not call a "code blue"   In the event of cardiac or respiratory ARREST Do not perform Intubation, CPR, defibrillation or ACLS   In the event of cardiac or respiratory ARREST Use medication by any route, position, wound care, and other measures to relive pain and suffering. May use oxygen, suction and manual treatment of airway obstruction as needed for comfort.      01/14/22 1411           Code Status History     Date Active Date Inactive Code Status Order ID Comments User Context   01/13/2022 1659 01/14/2022 1411 DNR [id number]  Cristal Generous, NP ED   06/14/2020 1151 01/13/2022 1258 DNR [id number]  Ludwig Clarks, DO Outpatient   01/03/2019 2130 01/08/2019 0018 DNR [id number]  Vianne Bulls, MD ED   10/13/2017 0657 10/17/2017 1619 DNR [id number]  Rondel Jumbo, PA-C Inpatient   02/12/2017 0135 02/15/2017 1958 DNR [id number]  Norval Morton, MD Inpatient   11/23/2016 1537 11/28/2016 2237 DNR [id number]  Samella Parr, NP ED   11/23/2016 1028 11/23/2016 1537 DNR [id number]  Samella Parr, NP ED   09/17/2016 2121 09/23/2016 1956 DNR [id number]  Ivor Costa, MD ED   09/17/2016 2118 09/17/2016 2121 Full  Code [id number]  Ivor Costa, MD ED   04/29/2016 0615 05/03/2016 1657 DNR [id number]  Reubin Milan, MD Inpatient   12/20/2015 1848 01/01/2016 1400 DNR [id number]  Cathlyn Parsons, PA-C Inpatient   12/17/2015 1616 12/20/2015 1822 DNR [id number]  Samella Parr, NP Inpatient   05/22/2011 2118 05/24/2011 1819 Full Code [id number]  Margot Chimes Dizon, RN Inpatient      Advance Directive Documentation    Flowsheet Row Most Recent Value  Type of Advance Directive Healthcare Power of Attorney  Pre-existing out of facility DNR order (yellow form or pink MOST form) --  "MOST" Form in Place? --       Prognosis:  < 6 months  Discharge Planning: [place] with Hospice  Care plan was discussed with primary RN, patient's daughter  Thank you for allowing the Palliative Medicine Team to assist in the care of this patient.   Lin Landsman, NP  Please contact Palliative Medicine Team phone at [phone] for questions and concerns.   *Portions of this note are a verbal dictation therefore any spelling and/or grammatical errors are due to the "[place] [employer]" system interpretation.

## 2022-01-17 NOTE — TOC Transition Note (Addendum)
Transition of Care [facility]) - CM/SW Discharge Note   Patient Details  Name: [name] [name] MRN: [id number] Date of Birth: [birth year]  Transition of Care [facility]) CM/SW Contact:  Tresa Endo Phone Number: 01/17/2022, 9:59 AM   Clinical Narrative:    Patient will DC to: [place] date: 01/17/2022 Family notified: Pt Daughter Transport by: Corey Harold   Per MD patient ready for DC to [facility] RN to call report prior to discharge [phone]). RN, patient, patient's family, and facility notified of DC. Discharge Summary and FL2 sent to facility. DC packet on chart. Ambulance transport requested for patient.  CSW will sign off for now as social work intervention is no longer needed. Please consult [name] again if new needs arise.     Barriers to Discharge: Continued Medical Work up   Patient Goals and CMS Choice Patient states their goals for this hospitalization and ongoing recovery are:: residential hospice care CMS Medicare.gov Compare Post Acute Care list provided to:: Patient Represenative (must comment) (daughter, [name]) Choice offered to / list presented to : Adult Children  Discharge Placement                       Discharge Plan and Services In-house Referral: Clinical Social Work, Hospice / Palliative Care Discharge Planning Services: CM Consult Post Acute Care Choice: Hospice          DME Arranged: N/A DME Agency: NA       HH Arranged: NA HH Agency: NA        Social Determinants of Health (SDOH) Interventions Housing Interventions: Intervention Not Indicated   Readmission Risk Interventions     No data to display

## 2022-01-17 NOTE — NC FL2 (Signed)
[place] LEVEL OF CARE SCREENING TOOL     IDENTIFICATION  Patient Name: [name] [name] Birthdate: October 20, 1945 Sex: female Admission Date (Current Location): 01/13/2022  [place] [facility] and [place] Number:  Herbalist and Address:  [facility]. [place], [place] [address] [place], [place] [address]      Provider Number: [id number]  Attending Physician Name and Address:  Tawni Millers,*  Relative Name and Phone Number:  Harland German (Daughter) [phone]    Current Level of Care: Hospital Recommended Level of Care: [place] Prior Approval Number:    Date Approved/Denied:   PASRR Number:    Discharge Plan: Other (Comment) [facility])    Current Diagnoses: Patient Active Problem List   Diagnosis Date Noted   Hypernatremia 01/16/2022   Class 1 obesity 01/16/2022   Chest wall hematoma 01/16/2022   Septic shock ([place]) 01/13/2022   Major neurocognitive disorder 80/99/8338   Complicated UTI (urinary tract infection) 01/03/2019   Subtherapeutic international normalized ratio (INR) 01/03/2019   Hyponatremia 01/03/2019   Adenoma of right adrenal gland 02/20/2018   Osteopenia 12/18/2017   Glaucoma 10/13/2017   History of TIA (transient ischemic attack) 10/13/2017   History of prediabetes 10/13/2017   Recurrent UTI 11/23/2016   Fibromyalgia 11/23/2016   Pulmonary hypertension 11/23/2016   Left ventricular diastolic dysfunction 25/07/3974   History of pulmonary embolus (PE) 2/2 hypercoagulable state 11/23/2016   History of sepsis 73/41/9379   Acute metabolic encephalopathy 02/40/9735   SIRS (systemic inflammatory response syndrome) 04/29/2016   Hyperlipidemia 04/29/2016   Hypothyroidism 04/29/2016   Hematuria 04/05/2016   Benign essential hypertension    Gait disturbance 12/17/2015   Trochanteric bursitis of both hips 09/13/2015   Chronic low back pain 06/07/2015   Atypical parkinsonism state 05/17/2015    S/P right TKA 05/22/2011    Orientation RESPIRATION BLADDER Height & Weight     Self, Place  Normal Incontinent, External catheter Weight: 189 lb 6 oz (85.9 kg) Height:  '5\' 6"'$  (167.6 cm)  BEHAVIORAL SYMPTOMS/MOOD NEUROLOGICAL BOWEL NUTRITION STATUS      Incontinent    AMBULATORY STATUS COMMUNICATION OF NEEDS Skin     Verbally Surgical wounds                       Personal Care Assistance Level of Assistance              Functional Limitations Info  Sight, Hearing, Speech Sight Info: Adequate Hearing Info: Adequate Speech Info: Adequate    SPECIAL CARE FACTORS FREQUENCY                       Contractures Contractures Info: Not present    Additional Factors Info  Code Status, Allergies Code Status Info: DNR Allergies Info: Crestor (Rosuvastatin Calcium)  Erythromycin  Gabapentin  Pregabalin  Pregabalin  Macrobid (Nitrofurantoin)  Losartan           Current Medications (01/17/2022):  This is the current hospital active medication list Current Facility-Administered Medications  Medication Dose Route Frequency Provider Last Rate Last Admin   acetaminophen (TYLENOL) tablet 650 mg  650 mg Oral Q6H PRN Lin Landsman, NP       Or   acetaminophen (TYLENOL) suppository 650 mg  650 mg Rectal Q6H PRN Lin Landsman, NP       antiseptic oral rinse (BIOTENE) solution 15 mL  15 mL Topical BID Lin Landsman, NP   15 mL  at 01/17/22 0845   diphenhydrAMINE (BENADRYL) injection 12.5 mg  12.5 mg Intravenous Q4H PRN Lin Landsman, NP       glycopyrrolate (ROBINUL) tablet 1 mg  1 mg Oral Q4H PRN Lin Landsman, NP       Or   glycopyrrolate (ROBINUL) injection 0.2 mg  0.2 mg Subcutaneous Q4H PRN Lin Landsman, NP       Or   glycopyrrolate (ROBINUL) injection 0.2 mg  0.2 mg Intravenous Q4H PRN Lin Landsman, NP       haloperidol (HALDOL) tablet 2 mg  2 mg Oral Q6H PRN Lin Landsman, NP       Or   haloperidol (HALDOL) 2 MG/ML solution 2 mg  2 mg Sublingual Q6H PRN  Lin Landsman, NP       Or   haloperidol lactate (HALDOL) injection 2 mg  2 mg Intravenous Q6H PRN Lin Landsman, NP       LORazepam (ATIVAN) tablet 1 mg  1 mg Oral Q1H PRN Lin Landsman, NP   1 mg at 01/17/22 0007   Or   LORazepam (ATIVAN) 2 MG/ML concentrated solution 1 mg  1 mg Sublingual Q1H PRN Lin Landsman, NP       Or   LORazepam (ATIVAN) injection 1 mg  1 mg Intravenous Q1H PRN Lin Landsman, NP       morphine (PF) 2 MG/ML injection 2 mg  2 mg Intravenous Q2H PRN Lin Landsman, NP   2 mg at 01/16/22 0854   ondansetron (ZOFRAN-ODT) disintegrating tablet 4 mg  4 mg Oral Q6H PRN Lin Landsman, NP       Or   ondansetron [facility]) injection 4 mg  4 mg Intravenous Q6H PRN Lin Landsman, NP       polyethylene glycol (MIRALAX / GLYCOLAX) packet 17 g  17 g Oral Daily PRN Bowser, Laurel Dimmer, NP       polyvinyl alcohol (LIQUIFILM TEARS) 1.4 % ophthalmic solution 1 drop  1 drop Both Eyes QID PRN Lin Landsman, NP       white petrolatum (VASELINE) gel   Topical PRN Candee Furbish, MD         Discharge Medications: Please see discharge summary for a list of discharge medications.  Relevant Imaging Results:  Relevant Lab Results:   Additional Information SSN: [id number]; COVID Negtaive on 01/03/2019; going to need ertapenem 1 gm daily for 5 days starting 11/02  Reece Agar, LCSWA

## 2022-01-18 LAB — CULTURE, BLOOD (ROUTINE X 2): Culture: NO GROWTH

## 2022-01-30 NOTE — Progress Notes (Deleted)
Assessment/Plan:   1.  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  Understands that there is an increased risk of falls, aspiration as subsequent morbidity and mortality from this disease compared to Parkinson's disease.  We have asked her for a long time to stay seated and not walk because of recurrent falls and reiterated this today.  This includes getting assist for transfers.  -needs full time care  -pt is DNR  -continue carbidopa/levodopa 25/100 2/1/1 for now  2.  Dementia  -This was confirmed by neurocognitive testing in December, 2021 with Dr. Melvyn Novas.  -Unclear if patient is still on donepezil.  It was started last visit, but patient does not think she is on it.  Son is going to check with the daughter who does the medications.  If so, she will should be on 10 mg daily.   3.  Cervical dystonia, secondary to #1  -Have done Botox in the past.  Have decided not to do this again because of inconsistency with showing up and the fact that Botox does not work this way (multiple no-shows and cancellations) Subjective:   [name] [name] was seen today in follow up for atypical parkinsonism.  My previous records were reviewed prior to todays visit.  Was last seen in person about a year ago.  I saw her through video in April.  Patient was recently in the hospital for sepsis.  She was just discharged on November 14.  Patient presented with mental status change, which ultimately was due to hypotension secondary to sepsis from UTI.  During admission, patient was found to have chest wall hematoma and her anticoagulation that she was on for PE was stopped.  Patient was transitioned to comfort care.  Current prescribed movement disorder medications: Carbidopa/levodopa 25/100, 2/1/1 Donepezil, 10 mg daily   PREVIOUS MEDICATIONS: Sinemet  ALLERGIES:   Allergies  Allergen Reactions   Crestor [Rosuvastatin Calcium] Other (See Comments)     Feeling very bad, aching all over.   Erythromycin Hives   Gabapentin Other (See Comments)    Blurred vision   Pregabalin Other (See Comments)    Crossed vision.   Pregabalin Other (See Comments)    Crossed vision. Unknown    Macrobid [Nitrofurantoin] Hives   Losartan Itching    CURRENT MEDICATIONS:  Outpatient Encounter Medications as of 02/01/2022  Medication Sig   acetaminophen (TYLENOL) 500 MG tablet Take 500 mg by mouth every 6 (six) hours as needed for mild pain.   carbidopa-levodopa (SINEMET IR) 25-100 MG tablet Take 2 tablets at 7am/1 at 11am/1 at 4pm (Patient taking differently: Take 2 tablets by mouth 3 (three) times daily.)   donepezil (ARICEPT) 10 MG tablet Take 1 tablet (10 mg total) by mouth at bedtime.   HYDROcodone-acetaminophen (NORCO/VICODIN) 5-325 MG tablet Take 1 tablet by mouth every 8 (eight) hours as needed for moderate pain.   levothyroxine (SYNTHROID) 125 MCG tablet Take 125 mcg by mouth daily before breakfast. (Patient not taking: Reported on 01/13/2022)   melatonin 3 MG TABS tablet Take 3 mg by mouth at bedtime.   QUEtiapine (SEROQUEL) 25 MG tablet Take 12.5-25 mg by mouth See admin instructions. Take 12.5 mg (1/2 tablet) by mouth in the morning, and 25 mg (1 tablet) at bedtime.   sertraline (ZOLOFT) 100 MG tablet Take 100 mg by mouth daily.   No facility-administered encounter medications on file as of 02/01/2022.    Objective:   PHYSICAL EXAMINATION:  VITALS:   There were no vitals filed for this visit.    GEN:  The patient appears stated age and is in NAD. HEENT:  Normocephalic, atraumatic.  The mucous membranes are moist.   Neurological examination:  Orientation: The patient is alert and oriented to person/place.   Cranial nerves: There is good facial symmetry with facial hypomimia. The speech is fluent and clear. Soft palate rises symmetrically and there is no tongue deviation. Hearing is intact to conversational tone. Sensation:  Sensation is intact to light touch throughout Motor: Strength is at least antigravity x4.  Movement examination: Tone: There is normal tone today. Abnormal movements: none Coordination:  There is slowness with all RAMs and difficulty assessing decremation because of apraxia.  The left is worse than the right. Gait and Station: not tested  I have reviewed and interpreted the following labs independently    Chemistry      Component Value Date/Time   NA 149 (H) 01/14/2022 0204   NA 145 12/01/2016 0000   K 3.4 (L) 01/14/2022 0204   CL 116 (H) 01/14/2022 0204   CO2 24 01/14/2022 0204   BUN 24 (H) 01/14/2022 0204   BUN 10 12/01/2016 0000   CREATININE 0.92 01/14/2022 0204   CREATININE 0.63 11/03/2019 1352   GLU 106 12/01/2016 0000      Component Value Date/Time   CALCIUM 7.8 (L) 01/14/2022 0204   ALKPHOS 119 01/13/2022 1305   AST 21 01/13/2022 1305   ALT 6 01/13/2022 1305   BILITOT 0.7 01/13/2022 1305       Lab Results  Component Value Date   WBC 18.2 (H) 01/14/2022   HGB 7.5 (L) 01/14/2022   HCT 25.2 (L) 01/14/2022   MCV 89.7 01/14/2022   PLT 383 01/14/2022    Lab Results  Component Value Date   TSH 0.26 (L) 12/29/2020     Total time spent on today's visit was *** minutes, including both face-to-face time and nonface-to-face time.  Time included that spent on review of records (prior notes available to me/labs/imaging if pertinent), discussing treatment and goals, answering patient's questions and coordinating care.  Cc:  [name], [name], MD

## 2022-02-01 ENCOUNTER — Ambulatory Visit: Payer: Medicare Other | Admitting: Neurology

## 2022-04-25 ENCOUNTER — Telehealth: Payer: Self-pay | Admitting: Family Medicine

## 2022-04-25 NOTE — Telephone Encounter (Signed)
Called and spoke with [place], her daughter.  She feels her mom was comfortable and at peace when she died,  she thinks the death cert is handled per hospice

## 2022-05-05 DEATH — deceased
